# Patient Record
Sex: Female | Born: 1970
Health system: Southern US, Community
[De-identification: ages and names within clinical notes are randomized; demographics above are authoritative.]

## PROBLEM LIST (undated history)

## (undated) DIAGNOSIS — IMO0002 Reserved for concepts with insufficient information to code with codable children: Secondary | ICD-10-CM

## (undated) DIAGNOSIS — C9110 Chronic lymphocytic leukemia of B-cell type not having achieved remission: Secondary | ICD-10-CM

## (undated) DIAGNOSIS — M719 Bursopathy, unspecified: Secondary | ICD-10-CM

## (undated) DIAGNOSIS — R87612 Low grade squamous intraepithelial lesion on cytologic smear of cervix (LGSIL): Secondary | ICD-10-CM

## (undated) DIAGNOSIS — G5600 Carpal tunnel syndrome, unspecified upper limb: Secondary | ICD-10-CM

## (undated) DIAGNOSIS — J449 Chronic obstructive pulmonary disease, unspecified: Secondary | ICD-10-CM

## (undated) DIAGNOSIS — Z720 Tobacco use: Secondary | ICD-10-CM

## (undated) DIAGNOSIS — F509 Eating disorder, unspecified: Secondary | ICD-10-CM

## (undated) DIAGNOSIS — N879 Dysplasia of cervix uteri, unspecified: Secondary | ICD-10-CM

## (undated) DIAGNOSIS — N870 Mild cervical dysplasia: Secondary | ICD-10-CM

## (undated) DIAGNOSIS — K219 Gastro-esophageal reflux disease without esophagitis: Secondary | ICD-10-CM

## (undated) DIAGNOSIS — E538 Deficiency of other specified B group vitamins: Secondary | ICD-10-CM

## (undated) DIAGNOSIS — C539 Malignant neoplasm of cervix uteri, unspecified: Secondary | ICD-10-CM

## (undated) DIAGNOSIS — F32A Depression, unspecified: Secondary | ICD-10-CM

## (undated) DIAGNOSIS — F329 Major depressive disorder, single episode, unspecified: Secondary | ICD-10-CM

## (undated) DIAGNOSIS — F428 Other obsessive-compulsive disorder: Secondary | ICD-10-CM

## (undated) DIAGNOSIS — F419 Anxiety disorder, unspecified: Secondary | ICD-10-CM

## (undated) DIAGNOSIS — G4700 Insomnia, unspecified: Secondary | ICD-10-CM

## (undated) DIAGNOSIS — Z915 Personal history of self-harm: Secondary | ICD-10-CM

## (undated) HISTORY — DX: Deficiency of other specified B group vitamins: E53.8

## (undated) HISTORY — DX: Malignant neoplasm of cervix uteri, unspecified: C53.9

## (undated) HISTORY — DX: Personal history of self-harm: Z91.5

## (undated) HISTORY — DX: Depression, unspecified: F32.A

## (undated) HISTORY — DX: Carpal tunnel syndrome, unspecified upper limb: G56.00

## (undated) HISTORY — DX: Bursopathy, unspecified: M71.9

## (undated) HISTORY — DX: Tobacco use: Z72.0

## (undated) HISTORY — PX: CERVICAL BIOPSY  W/ LOOP ELECTRODE EXCISION: SUR135

## (undated) HISTORY — DX: Major depressive disorder, single episode, unspecified: F32.9

## (undated) HISTORY — PX: OTHER SURGICAL HISTORY: SHX169

## (undated) HISTORY — DX: Mild cervical dysplasia: N87.0

## (undated) HISTORY — PX: TUBAL LIGATION: SHX77

## (undated) HISTORY — DX: Reserved for concepts with insufficient information to code with codable children: IMO0002

## (undated) HISTORY — DX: Eating disorder, unspecified: F50.9

## (undated) HISTORY — DX: Other obsessive-compulsive disorder: F42.8

## (undated) HISTORY — DX: Insomnia, unspecified: G47.00

## (undated) HISTORY — DX: Dysplasia of cervix uteri, unspecified: N87.9

## (undated) HISTORY — DX: Anxiety disorder, unspecified: F41.9

## (undated) HISTORY — DX: Low grade squamous intraepithelial lesion on cytologic smear of cervix (LGSIL): R87.612

## (undated) MED FILL — Dexamethasone Sodium Phosphate Inj 100 MG/10ML: INTRAMUSCULAR | Qty: 2 | Status: AC

## (undated) MED FILL — Ferumoxytol Inj 510 MG/17ML (30 MG/ML) (Elemental Fe): INTRAVENOUS | Qty: 17 | Status: AC

---

## 2003-09-17 HISTORY — PX: OTHER SURGICAL HISTORY: SHX169

## 2006-05-13 ENCOUNTER — Encounter: Payer: Self-pay | Admitting: Family Medicine

## 2006-05-17 ENCOUNTER — Encounter: Payer: Self-pay | Admitting: Family Medicine

## 2006-06-16 ENCOUNTER — Encounter: Payer: Self-pay | Admitting: Family Medicine

## 2006-08-28 ENCOUNTER — Inpatient Hospital Stay: Payer: Self-pay | Admitting: Obstetrics and Gynecology

## 2008-04-23 ENCOUNTER — Emergency Department: Payer: Self-pay | Admitting: Emergency Medicine

## 2009-02-28 ENCOUNTER — Emergency Department: Payer: Self-pay

## 2012-04-20 ENCOUNTER — Emergency Department (HOSPITAL_COMMUNITY)
Admission: EM | Admit: 2012-04-20 | Discharge: 2012-04-20 | Discharge status: Against Medical Advice | Payer: Self-pay | Attending: Emergency Medicine | Admitting: Emergency Medicine

## 2012-04-20 ENCOUNTER — Encounter (HOSPITAL_COMMUNITY): Payer: Self-pay | Admitting: Emergency Medicine

## 2012-04-20 DIAGNOSIS — H538 Other visual disturbances: Secondary | ICD-10-CM | POA: Insufficient documentation

## 2012-04-20 NOTE — ED Notes (Addendum)
Reports when at work (lots of chaos and drama per pt); from L eye was seeing waves and black spots, and felt like she was going to pass out; now feels drained and headache--now waves and spots are not as bad as they were at work and are intermittent; grips equal, face symmetrical, speech clear

## 2014-07-07 DIAGNOSIS — R87612 Low grade squamous intraepithelial lesion on cytologic smear of cervix (LGSIL): Secondary | ICD-10-CM | POA: Insufficient documentation

## 2014-07-07 LAB — LIPID PANEL
Cholesterol: 152 mg/dL (ref 0–200)
HDL: 68 mg/dL (ref 35–70)
LDL CALC: 73 mg/dL
TRIGLYCERIDES: 53 mg/dL (ref 40–160)

## 2014-07-07 LAB — HM PAP SMEAR: HM Pap smear: ABNORMAL

## 2014-07-18 DIAGNOSIS — Z87410 Personal history of cervical dysplasia: Secondary | ICD-10-CM | POA: Insufficient documentation

## 2014-08-03 ENCOUNTER — Ambulatory Visit: Payer: Self-pay | Admitting: Family Medicine

## 2014-08-03 LAB — HM MAMMOGRAPHY: HM Mammogram: NORMAL

## 2015-07-10 ENCOUNTER — Encounter: Payer: Self-pay | Admitting: Family Medicine

## 2015-07-10 DIAGNOSIS — F5001 Anorexia nervosa, restricting type: Secondary | ICD-10-CM | POA: Insufficient documentation

## 2015-07-10 DIAGNOSIS — D72820 Lymphocytosis (symptomatic): Secondary | ICD-10-CM | POA: Insufficient documentation

## 2015-07-10 DIAGNOSIS — Z9151 Personal history of suicidal behavior: Secondary | ICD-10-CM | POA: Insufficient documentation

## 2015-07-10 DIAGNOSIS — F411 Generalized anxiety disorder: Secondary | ICD-10-CM | POA: Insufficient documentation

## 2015-07-10 DIAGNOSIS — F429 Obsessive-compulsive disorder, unspecified: Secondary | ICD-10-CM | POA: Insufficient documentation

## 2015-07-10 DIAGNOSIS — E538 Deficiency of other specified B group vitamins: Secondary | ICD-10-CM | POA: Insufficient documentation

## 2015-07-10 DIAGNOSIS — N870 Mild cervical dysplasia: Secondary | ICD-10-CM | POA: Insufficient documentation

## 2015-07-10 DIAGNOSIS — Z720 Tobacco use: Secondary | ICD-10-CM | POA: Insufficient documentation

## 2015-07-10 DIAGNOSIS — Z915 Personal history of self-harm: Secondary | ICD-10-CM | POA: Insufficient documentation

## 2015-07-10 DIAGNOSIS — G4700 Insomnia, unspecified: Secondary | ICD-10-CM | POA: Insufficient documentation

## 2015-07-10 DIAGNOSIS — F330 Major depressive disorder, recurrent, mild: Secondary | ICD-10-CM | POA: Insufficient documentation

## 2015-07-12 ENCOUNTER — Encounter: Payer: Self-pay | Admitting: Family Medicine

## 2015-07-12 ENCOUNTER — Ambulatory Visit (INDEPENDENT_AMBULATORY_CARE_PROVIDER_SITE_OTHER): Payer: BLUE CROSS/BLUE SHIELD | Admitting: Family Medicine

## 2015-07-12 VITALS — BP 104/70 | HR 78 | Temp 98.2°F | Resp 14 | Ht 69.0 in | Wt 145.1 lb

## 2015-07-12 DIAGNOSIS — Z23 Encounter for immunization: Secondary | ICD-10-CM

## 2015-07-12 DIAGNOSIS — R87612 Low grade squamous intraepithelial lesion on cytologic smear of cervix (LGSIL): Secondary | ICD-10-CM

## 2015-07-12 DIAGNOSIS — Z0000 Encounter for general adult medical examination without abnormal findings: Secondary | ICD-10-CM

## 2015-07-12 DIAGNOSIS — F50019 Anorexia nervosa, restricting type, unspecified: Secondary | ICD-10-CM

## 2015-07-12 DIAGNOSIS — Z720 Tobacco use: Secondary | ICD-10-CM

## 2015-07-12 DIAGNOSIS — E538 Deficiency of other specified B group vitamins: Secondary | ICD-10-CM

## 2015-07-12 DIAGNOSIS — Z01419 Encounter for gynecological examination (general) (routine) without abnormal findings: Secondary | ICD-10-CM

## 2015-07-12 DIAGNOSIS — Z1239 Encounter for other screening for malignant neoplasm of breast: Secondary | ICD-10-CM

## 2015-07-12 DIAGNOSIS — Z124 Encounter for screening for malignant neoplasm of cervix: Secondary | ICD-10-CM

## 2015-07-12 DIAGNOSIS — F330 Major depressive disorder, recurrent, mild: Secondary | ICD-10-CM

## 2015-07-12 DIAGNOSIS — F5001 Anorexia nervosa, restricting type: Secondary | ICD-10-CM

## 2015-07-12 DIAGNOSIS — Z13220 Encounter for screening for lipoid disorders: Secondary | ICD-10-CM | POA: Diagnosis not present

## 2015-07-12 DIAGNOSIS — D72829 Elevated white blood cell count, unspecified: Secondary | ICD-10-CM

## 2015-07-12 NOTE — Progress Notes (Signed)
Name: Monique [name]   MRN: [id number]    DOB: [birth year]   Date:07/12/2015       Progress Note  Subjective  Chief Complaint  Chief Complaint  Patient presents with  . Annual Exam    HPI  Well Woman Exam: she states she is emotionally not doing well. Anorexia is recurring, lost 6 lbs since last visit, worried about finances.  Sexually active, same partner, s/p tubal ligation.   Leukocytosis and thrombocytosis history : we will recheck  CBC  Major Depression/GAD/Anorexia: not doing very well, she has stopped taking Fluvoxamine daily , only taking prn medication now.  Stopped taking the higher dose because it was making her like a zombie, only taking prn 25 mg prn . She denies suicidal thoughts or ideation. She has episodes of anhedonia. 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   Abnormal pap smear: had colposcopy by GYN ( [facility] - Dr Marcelline Mates ) and results showed CIN and advised to keep having yearly pap smears. No vaginal bleeding or dyspareunia, but has some post-coital spotting for about one day.  .  Patient Active Problem List   Diagnosis Date Noted  . B12 deficiency 07/10/2015  . Insomnia, persistent 07/10/2015  . Mild episode of recurrent major depressive disorder ([place]) 07/10/2015  . Anorexia nervosa, restricting type 07/10/2015  . Anxiety, generalized 07/10/2015  . H/O suicide attempt 07/10/2015  . Leukocytosis 07/10/2015  . Cervical dysplasia, mild 07/10/2015  . Obsessive-compulsive disorder 07/10/2015  . Tobacco use 07/10/2015  . Low grade squamous intraepithelial lesion (LGSIL) on cervical Pap smear 07/07/2014    Past Surgical History  Procedure Laterality Date  . Cervical biopsy  w/ loop electrode excision    . Tubal ligation      Family History  Problem Relation Age of Onset  . Cancer Mother     thyroid  . Alcohol abuse Father   . Depression Sister   . Cancer Sister     cevical  . Alcohol abuse  Brother   . Depression Brother   . Bipolar disorder Brother   . ADD / ADHD Son     Social History   Social History  . Marital Status: Divorced    Spouse Name: N/A  . Number of Children: N/A  . Years of Education: N/A   Occupational History  . Not on file.   Social History Main Topics  . Smoking status: Current Every Day Smoker -- 1.50 packs/day  . Smokeless tobacco: Never Used  . Alcohol Use: No  . Drug Use: No  . Sexual Activity: Yes   Other Topics Concern  . Not on file   Social History Narrative     Current outpatient prescriptions:  .  fluvoxaMINE (LUVOX) 25 MG tablet, Take 25 mg by mouth 2 (two) times daily., Disp: , Rfl:   No Known Allergies   ROS  Constitutional: Negative for fever or weight change.  Respiratory: Negative for cough and shortness of breath.   Cardiovascular: Negative for chest pain or palpitations.  Gastrointestinal: Negative for abdominal pain, no bowel changes.  Musculoskeletal: Negative for gait problem or joint swelling.  Skin: Negative for rash.  Neurological: Negative for dizziness or headache.  No other specific complaints in a complete review of systems (except as listed in HPI above).  Objective  Filed Vitals:   07/12/15 0846  BP: 104/70  Pulse: 78  Temp: 98.2 F (36.8 C)  TempSrc: Oral  Resp: 14  Height: 5\' 9"  (1.753 m)  Weight: 145 lb 1.6 oz (65.817 kg)  SpO2: 98%    Body mass index is 21.42 kg/(m^2).  Physical Exam  Constitutional: Patient appears well-developed and well-nourished. No distress.  HENT: Head: Normocephalic and atraumatic. Ears: B TMs ok, no erythema or effusion; Nose: Nose normal. Mouth/Throat: Oropharynx is clear and moist. No oropharyngeal exudate.  Eyes: Conjunctivae and EOM are normal. Pupils are equal, round, and reactive to light. No scleral icterus.  Neck: Normal range of motion. Neck supple. No JVD present. No thyromegaly present.  Cardiovascular: Normal rate, regular rhythm and normal  heart sounds.  No murmur heard. No BLE edema. Pulmonary/Chest: Effort normal and breath sounds normal. No respiratory distress. Abdominal: Soft. Bowel sounds are normal, no distension. There is no tenderness. no masses Breast: no lumps or masses, no nipple discharge or rashes FEMALE GENITALIA:  External genitalia normal External urethra normal Vaginal vault normal without discharge or lesions Cervix friable Bimanual exam normal without masses RECTAL: normal external exam, internal hemorrhoid, non tender at 9 o'clock Musculoskeletal: Normal range of motion, no joint effusions. No gross deformities Neurological: he is alert and oriented to person, place, and time. No cranial nerve deficit. Coordination, balance, strength, speech and gait are normal.  Skin: Skin is warm and dry. No rash noted. No erythema.  Psychiatric: Patient has a normal mood and affect. behavior is normal. Judgment and thought content normal.   PHQ2/9: Depression screen PHQ 2/9 07/12/2015  Decreased Interest 0  Down, Depressed, Hopeless 1  PHQ - 2 Score 1    Fall Risk: Fall Risk  07/12/2015  Falls in the past year? No    Functional Status Survey: Is the patient deaf or have difficulty hearing?: No Does the patient have difficulty seeing, even when wearing glasses/contacts?: Yes (contacts/glasses) Does the patient have difficulty walking or climbing stairs?: No Does the patient have difficulty dressing or bathing?: No Does the patient have difficulty doing errands alone such as visiting a doctor's office or shopping?: No    Assessment & Plan  1. Well woman exam  Discussed importance of 150 minutes of physical activity weekly, eat two servings of fish weekly, eat one serving of tree nuts ( cashews, pistachios, pecans, almonds.Marland Kitchen) every other day, eat 6 servings of fruit/vegetables daily and drink plenty of water and avoid sweet beverages.   2. Needs flu shot  - Flu Vaccine QUAD 36+ mos PF IM (Fluarix &  Fluzone Quad PF) - refused  3. Leukocytosis  Recheck labs  4. Low grade squamous intraepithelial lesion (LGSIL) on cervical Pap smear  Recheck pap smear  5. Tobacco use  She would like to stop, but not a good candidate for chantix or Wellbutrin, she will try to wean self off by 2 cigarettes less per day, and when she goes to half pack, she will try to think when she really needs/craves smoking and only smoke at that time. May also try nicotine patch or gum  6. Anorexia nervosa, restricting type  - Comprehensive metabolic panel - Vit D  25 hydroxy (rtn osteoporosis monitoring) - CBC with Differential/Platelet  7. B12 deficiency  - Vitamin B12  8. Breast cancer screening  - MM Digital Screening; Future  9. Cervical cancer screening  - Pap IG, CT/NG NAA, and HPV (high risk)  10. Lipid screening  - Lipid panel  11. Mild episode of recurrent major depressive disorder ([place])  She will resume medication that she has at home and will  follow up in one month, discussed suicide hot line

## 2015-07-15 LAB — PAP IG, CT-NG NAA, HPV HIGH-RISK
CHLAMYDIA, NUC. ACID AMP: NEGATIVE
Gonococcus by Nucleic Acid Amp: NEGATIVE
HPV, HIGH-RISK: POSITIVE — AB
PAP SMEAR COMMENT: 0

## 2015-07-19 ENCOUNTER — Other Ambulatory Visit: Payer: Self-pay

## 2015-07-19 ENCOUNTER — Telehealth: Payer: Self-pay

## 2015-07-19 DIAGNOSIS — R87612 Low grade squamous intraepithelial lesion on cytologic smear of cervix (LGSIL): Secondary | ICD-10-CM

## 2015-07-19 NOTE — Telephone Encounter (Signed)
For patients referral to [facility] she states needs a Wednesday or thursday

## 2015-07-20 NOTE — Telephone Encounter (Signed)
Ok, thanks, I have sent the referral to [facility]

## 2015-08-02 ENCOUNTER — Ambulatory Visit: Payer: BLUE CROSS/BLUE SHIELD | Admitting: Family Medicine

## 2015-08-16 ENCOUNTER — Ambulatory Visit (INDEPENDENT_AMBULATORY_CARE_PROVIDER_SITE_OTHER): Payer: BLUE CROSS/BLUE SHIELD | Admitting: Obstetrics and Gynecology

## 2015-08-16 ENCOUNTER — Encounter: Payer: Self-pay | Admitting: Obstetrics and Gynecology

## 2015-08-16 VITALS — BP 100/61 | HR 84 | Ht 69.0 in | Wt 147.5 lb

## 2015-08-16 DIAGNOSIS — N871 Moderate cervical dysplasia: Secondary | ICD-10-CM

## 2015-08-16 DIAGNOSIS — R87612 Low grade squamous intraepithelial lesion on cytologic smear of cervix (LGSIL): Secondary | ICD-10-CM

## 2015-08-18 NOTE — Progress Notes (Signed)
GYNECOLOGY CLINIC COLPOSCOPY PROCEDURE NOTE   [name] is a 44 y.o. P66 female here for colposcopy for low-grade squamous intraepithelial neoplasia (LGSIL - encompassing HPV,mild dysplasia,CIN I) pap smear on 06/2015.  Referred from [facility] (Dr. Ancil Boozer). Of note, patient with h/o LGSIL pap last year s/p colposcopy with CIN I-II noted on biopsy.  Has h/o LEEP in the past. Discussed role for HPV in cervical dysplasia, need for surveillance.  Patient given informed verbal consent.   Placed in lithotomy position. Cervix viewed with speculum and colposcope after application of acetic acid.   Colposcopy adequate? Yes  no visible lesions; a random biopsy at 3 o'clock obtained.  ECC specimen obtained. All specimens were labelled and sent to pathology.  Patient was given post procedure instructions.  Will follow up pathology and manage accordingly.  Routine preventative health maintenance measures emphasized.  Advised on smoking cessation.     Rubie Maid, MD [facility]

## 2015-08-21 ENCOUNTER — Encounter: Payer: Self-pay | Admitting: Obstetrics and Gynecology

## 2015-08-21 LAB — PATHOLOGY

## 2015-08-22 ENCOUNTER — Telehealth: Payer: Self-pay | Admitting: Obstetrics and Gynecology

## 2015-08-22 NOTE — Telephone Encounter (Signed)
Spoke with Dr.Cherry, she gave verbal permission to give pt negative biopsy results. Pt informed.

## 2015-08-22 NOTE — Telephone Encounter (Signed)
Pt called for her pathology results. Was a little nervous and anxious to find out,

## 2015-09-14 ENCOUNTER — Encounter: Payer: Self-pay | Admitting: Family Medicine

## 2015-09-14 ENCOUNTER — Telehealth: Payer: Self-pay

## 2015-09-14 NOTE — Telephone Encounter (Signed)
Monique [name] from [facility] called to let us know they received patients lab results and scanned them into the media.  I called patient to notify of normal results except for glucose was a little low, which was due to fasting.  WBC was elevated and would recheck at next visit. B12 was towards low end of normal and to take OTC B12 supplement.

## 2016-01-03 ENCOUNTER — Encounter: Payer: Self-pay | Admitting: Family Medicine

## 2016-01-03 ENCOUNTER — Ambulatory Visit (INDEPENDENT_AMBULATORY_CARE_PROVIDER_SITE_OTHER): Payer: BLUE CROSS/BLUE SHIELD | Admitting: Family Medicine

## 2016-01-03 VITALS — BP 100/61 | HR 84 | Temp 98.8°F | Resp 17 | Ht 69.0 in | Wt 148.4 lb

## 2016-01-03 DIAGNOSIS — R21 Rash and other nonspecific skin eruption: Secondary | ICD-10-CM

## 2016-01-03 DIAGNOSIS — L03312 Cellulitis of back [any part except buttock]: Secondary | ICD-10-CM | POA: Diagnosis not present

## 2016-01-03 DIAGNOSIS — L03114 Cellulitis of left upper limb: Secondary | ICD-10-CM

## 2016-01-03 MED ORDER — MUPIROCIN 2 % EX OINT
1.0000 | TOPICAL_OINTMENT | Freq: Three times a day (TID) | CUTANEOUS | Status: DC
Start: 2016-01-03 — End: 2016-08-14

## 2016-01-03 MED ORDER — CLINDAMYCIN HCL 300 MG PO CAPS: 300.0000 mg | ORAL_CAPSULE | Freq: Four times a day (QID) | ORAL | Status: DC

## 2016-01-03 NOTE — Progress Notes (Signed)
Name: Monique [name]   MRN: [id number]    DOB: 03-16-1971   Date:01/03/2016       Progress Note  Subjective  Chief Complaint  Chief Complaint  Patient presents with  . Acute Visit    Possible spider bites (Dr. Ancil Boozer pt)    HPI  Pt. Presents for evaluation of what appears to be an insect bite on her left upper arm and left lateral upper back. Noticed 2 days ago. The lesion on the back and the arm did not cause any drainage but they are sore to touch. She applied Hydrocortisone cream to the lesion on her arm.   She also noticed a rash on her lower face (chin) for two weeks. Lesions described as 'heat bumps', sometimes a clear liquid comes out when she tries to push them. She has applied Hydrocortisone cream to her face and has not helped with the lesions.  Past Medical History  Diagnosis Date  . Cervical intraepithelial neoplasia I   . Pap smear abnormality of cervix with LGSIL   . History of self-harm   . Vitamin B12 deficiency (non anemic)   . Insomnia   . Depression   . Tobacco abuse   . Obsession   . Anxiety   . Eating disorder     Past Surgical History  Procedure Laterality Date  . Cervical biopsy  w/ loop electrode excision    . Tubal ligation      Family History  Problem Relation Age of Onset  . Cancer Mother     thyroid  . Alcohol abuse Father   . Depression Sister   . Cancer Sister     cevical  . Alcohol abuse Brother   . Depression Brother   . Bipolar disorder Brother   . ADD / ADHD Son     Social History   Social History  . Marital Status: Divorced    Spouse Name: N/A  . Number of Children: N/A  . Years of Education: N/A   Occupational History  . Not on file.   Social History Main Topics  . Smoking status: Current Every Day Smoker -- 1.50 packs/day  . Smokeless tobacco: Never Used  . Alcohol Use: No  . Drug Use: No  . Sexual Activity: Yes   Other Topics Concern  . Not on file   Social History Narrative     Current outpatient  prescriptions:  .  fluvoxaMINE (LUVOX) 25 MG tablet, Take 25 mg by mouth 2 (two) times daily. Reported on 01/03/2016, Disp: , Rfl:   No Known Allergies   Review of Systems  Constitutional: Negative for fever, chills and malaise/fatigue.  Skin: Positive for rash.     Objective  Filed Vitals:   01/03/16 0908  BP: 100/61  Pulse: 84  Temp: 98.8 F (37.1 C)  TempSrc: Oral  Resp: 17  Height: 5\' 9"  (1.753 m)  Weight: 148 lb 6.4 oz (67.314 kg)  SpO2: 98%    Physical Exam  Constitutional: She is well-developed, well-nourished, and in no distress.  HENT:  Head: Normocephalic and atraumatic.  Multiple erythematous raised lesions on the chin, appear to resemble lesions of impetigo  Skin: Rash noted. Rash is macular. There is erythema.     Macular, erythematous lesion on left upper arm, no drainage or fluctuance. Similar lesion on left upper back, no drainage.  Nursing note and vitals reviewed.    Assessment & Plan  1. Facial rash Start on Bactroban ointment to be applied 3 times  daily. Reassess if no improvement - mupirocin ointment (BACTROBAN) 2 %; Apply 1 application topically 3 (three) times daily.  Dispense: 30 g; Refill: 0  2. Cellulitis of left upper arm No abscess on palpation, we will treat with clindamycin. Reassess if no improvement - clindamycin (CLEOCIN) 300 MG capsule; Take 1 capsule (300 mg total) by mouth 4 (four) times daily.  Dispense: 40 capsule; Refill: 0  3. Cellulitis of skin of back  - clindamycin (CLEOCIN) 300 MG capsule; Take 1 capsule (300 mg total) by mouth 4 (four) times daily.  Dispense: 40 capsule; Refill: 0   Myleah Cavendish [facility] 01/03/2016 9:27 AM

## 2016-03-19 ENCOUNTER — Emergency Department
Admission: EM | Admit: 2016-03-19 | Discharge: 2016-03-19 | Disposition: A | Discharge status: Home / Self Care | Payer: BLUE CROSS/BLUE SHIELD | Attending: Emergency Medicine | Admitting: Emergency Medicine

## 2016-03-19 DIAGNOSIS — Y939 Activity, unspecified: Secondary | ICD-10-CM | POA: Insufficient documentation

## 2016-03-19 DIAGNOSIS — X58XXXA Exposure to other specified factors, initial encounter: Secondary | ICD-10-CM | POA: Insufficient documentation

## 2016-03-19 DIAGNOSIS — Y999 Unspecified external cause status: Secondary | ICD-10-CM | POA: Diagnosis not present

## 2016-03-19 DIAGNOSIS — F17200 Nicotine dependence, unspecified, uncomplicated: Secondary | ICD-10-CM | POA: Insufficient documentation

## 2016-03-19 DIAGNOSIS — Y929 Unspecified place or not applicable: Secondary | ICD-10-CM | POA: Diagnosis not present

## 2016-03-19 DIAGNOSIS — S86911A Strain of unspecified muscle(s) and tendon(s) at lower leg level, right leg, initial encounter: Secondary | ICD-10-CM | POA: Diagnosis not present

## 2016-03-19 DIAGNOSIS — F329 Major depressive disorder, single episode, unspecified: Secondary | ICD-10-CM | POA: Diagnosis not present

## 2016-03-19 DIAGNOSIS — T148XXA Other injury of unspecified body region, initial encounter: Secondary | ICD-10-CM

## 2016-03-19 DIAGNOSIS — M79604 Pain in right leg: Secondary | ICD-10-CM | POA: Diagnosis present

## 2016-03-19 MED ORDER — CYCLOBENZAPRINE HCL 10 MG PO TABS: 10.0000 mg | ORAL_TABLET | Freq: Three times a day (TID) | ORAL | Status: DC | PRN

## 2016-03-19 MED ORDER — IBUPROFEN 800 MG PO TABS: 800.0000 mg | ORAL_TABLET | Freq: Three times a day (TID) | ORAL | Status: DC | PRN

## 2016-03-19 NOTE — ED Notes (Signed)
Pt alert and oriented X4, active, cooperative, pt in NAD. RR even and unlabored, color WNL.  Pt informed to return if any life threatening symptoms occur.  Ambulatory. 

## 2016-03-19 NOTE — ED Notes (Signed)
Pt states three days of right sharp shooting leg pain that radiates from hip to thigh posteriorly and anteriorly. Pt with cms currently intact to toes, but states sometimes when she sits she feels decreased sensation to right toes. Pt states took 2 ibuprofen pta.

## 2016-03-19 NOTE — Discharge Instructions (Signed)
Muscle Strain A muscle strain is an injury that occurs when a muscle is stretched beyond its normal length. Usually a small number of muscle fibers are torn when this happens. Muscle strain is rated in degrees. First-degree strains have the least amount of muscle fiber tearing and pain. Second-degree and third-degree strains have increasingly more tearing and pain.  Usually, recovery from muscle strain takes 1-2 weeks. Complete healing takes 5-6 weeks.  CAUSES  Muscle strain happens when a sudden, violent force placed on a muscle stretches it too far. This may occur with lifting, sports, or a fall.  RISK FACTORS Muscle strain is especially common in athletes.  SIGNS AND SYMPTOMS At the site of the muscle strain, there may be:  Pain.  Bruising.  Swelling.  Difficulty using the muscle due to pain or lack of normal function. DIAGNOSIS  Your health care provider will perform a physical exam and ask about your medical history. TREATMENT  Often, the best treatment for a muscle strain is resting, icing, and applying cold compresses to the injured area.  HOME CARE INSTRUCTIONS   Use the PRICE method of treatment to promote muscle healing during the first 2-3 days after your injury. The PRICE method involves:  Protecting the muscle from being injured again.  Restricting your activity and resting the injured body part.  Icing your injury. To do this, put ice in a plastic bag. Place a towel between your skin and the bag. Then, apply the ice and leave it on from 15-20 minutes each hour. After the third day, switch to moist heat packs.  Apply compression to the injured area with a splint or elastic bandage. Be careful not to wrap it too tightly. This may interfere with blood circulation or increase swelling.  Elevate the injured body part above the level of your heart as often as you can.  Only take over-the-counter or prescription medicines for pain, discomfort, or fever as directed by your  health care provider.  Warming up prior to exercise helps to prevent future muscle strains. SEEK MEDICAL CARE IF:   You have increasing pain or swelling in the injured area.  You have numbness, tingling, or a significant loss of strength in the injured area. MAKE SURE YOU:   Understand these instructions.  Will watch your condition.  Will get help right away if you are not doing well or get worse.   This information is not intended to replace advice given to you by your health care provider. Make sure you discuss any questions you have with your health care provider.   Document Released: 09/02/2005 Document Revised: 06/23/2013 Document Reviewed: 04/01/2013 Elsevier Interactive Patient Education 2016 [place]. Groin Strain A groin strain (also called a groin pull) is an injury to the muscles or tendon on the upper inner part of the thigh. These muscles are called the adductor muscles or groin muscles. They are responsible for moving the leg across the body. A muscle strain occurs when a muscle is overstretched and some muscle fibers are torn. A groin strain can range from mild to severe depending on how many muscle fibers are affected and whether the muscle fibers are partially or completely torn.  Groin strains usually occur during exercise or participation in sports. The injury often happens when a sudden, violent force is placed on a muscle, stretching the muscle too far. A strain is more likely to occur when your muscles are not warmed up or if you are not properly conditioned. Depending on  the severity of the groin strain, recovery time may vary from a few weeks to several weeks. Severe injuries often require 4-6 weeks for recovery. In these cases, complete healing can take 4-5 months.  CAUSES   Stretching the groin muscles too far or too suddenly, often during side-to-side motion with an abrupt change in direction.  Putting repeated stress on the groin muscles over a long period  of time.  Performing vigorous activity without properly stretching the groin muscles beforehand. SYMPTOMS   Pain and tenderness in the groin area. This begins as sharp pain and persists as a dull ache.  Popping or snapping feeling when the injury occurs (for severe strains).  Swelling or bruising.  Muscle spasms.  Weakness in the leg.  Stiffness in the groin area with decreased ability to move the affected muscles. DIAGNOSIS  Your caregiver will perform a physical exam to diagnose a groin strain. You will be asked about your symptoms and how the injury occurred. X-rays are sometimes needed to rule out a broken bone or cartilage problems. Your caregiver may order a CT scan or MRI if a complete muscle tear is suspected. TREATMENT  A groin strain will often heal on its own. Your caregiver may prescribe medicines to help manage pain and swelling (anti-inflammatory medicine). You may be told to use crutches for the first few days to minimize your pain. HOME CARE INSTRUCTIONS   Rest. Do not use the strained muscle if it causes pain.  Put ice on the injured area.  Put ice in a plastic bag.  Place a towel between your skin and the bag.  Leave the ice on for 15-20 minutes, every 2-3 hours. Do this for the first 2 days after the injury.  Only take over-the-counter or prescription medicines as directed by your caregiver.  Wrap the injured area with an elastic bandage as directed by your caregiver.  Keep the injured leg raised (elevated).  Walk, stretch, and perform range-of-motion exercises to improve blood flow to the injured area. Only perform these activities if you can do so without any pain. To prevent muscle strains:  Warm up before exercise.  Develop proper conditioning and strength in the groin muscles. SEEK IMMEDIATE MEDICAL CARE IF:   You have increased pain or swelling in the affected area.   Your symptoms are not improving or are getting worse. MAKE SURE YOU:    Understand these instructions.  Will watch your condition.  Will get help right away if you are not doing well or get worse.   This information is not intended to replace advice given to you by your health care provider. Make sure you discuss any questions you have with your health care provider.   Document Released: 04/30/2004 Document Revised: 08/19/2012 Document Reviewed: 05/06/2012 Elsevier Interactive Patient Education [employer].

## 2016-03-19 NOTE — ED Provider Notes (Signed)
[facility] Emergency Department Provider Note        Time seen: ----------------------------------------- 7:10 AM on 03/19/2016 -----------------------------------------    I have reviewed the triage vital signs and the nursing notes.   HISTORY  Chief Complaint Leg Pain    HPI Monique [name] is a 45 y.o. female who presents ER for 3 days of shooting right leg pain. Patient states it radiates from her thigh to her knee. She states sometimes when she sits she feels decreased sensation to her toes. She took 2 ibuprofen prior to arrival. Currently she has no complaints. She denies fevers chills or other complaints.   Past Medical History  Diagnosis Date  . Cervical intraepithelial neoplasia I   . Pap smear abnormality of cervix with LGSIL   . History of self-harm   . Vitamin B12 deficiency (non anemic)   . Insomnia   . Depression   . Tobacco abuse   . Obsession   . Anxiety   . Eating disorder     Patient Active Problem List   Diagnosis Date Noted  . Facial rash 01/03/2016  . Cellulitis of left upper arm 01/03/2016  . Cellulitis of skin of back 01/03/2016  . B12 deficiency 07/10/2015  . Insomnia, persistent 07/10/2015  . Mild episode of recurrent major depressive disorder ([place]) 07/10/2015  . Anorexia nervosa, restricting type 07/10/2015  . Anxiety, generalized 07/10/2015  . H/O suicide attempt 07/10/2015  . Leukocytosis 07/10/2015  . Cervical dysplasia, mild 07/10/2015  . Obsessive-compulsive disorder 07/10/2015  . Tobacco use 07/10/2015  . Dysplasia of cervix, high grade CIN 2 07/18/2014  . Low grade squamous intraepithelial lesion (LGSIL) on cervical Pap smear 07/07/2014    Past Surgical History  Procedure Laterality Date  . Cervical biopsy  w/ loop electrode excision    . Tubal ligation      Allergies Review of patient's allergies indicates no known allergies.  Social History Social History  Substance Use Topics  . Smoking  status: Current Every Day Smoker -- 1.50 packs/day  . Smokeless tobacco: Never Used  . Alcohol Use: No    Review of Systems Constitutional: Negative for fever. Cardiovascular: Negative for chest pain. Respiratory: Negative for shortness of breath. Gastrointestinal: Negative for abdominal pain, vomiting and diarrhea. Musculoskeletal: Positive for right leg pain Skin: Negative for rash. Neurological: Negative for headaches, focal weakness or numbness. ____________________________________________   PHYSICAL EXAM:  VITAL SIGNS: ED Triage Vitals  Enc Vitals Group     BP 03/19/16 0516 103/72 mmHg     Pulse Rate 03/19/16 0516 76     Resp 03/19/16 0516 16     Temp 03/19/16 0516 98.7 F (37.1 C)     Temp Source 03/19/16 0516 Oral     SpO2 03/19/16 0516 100 %     Weight 03/19/16 0516 150 lb (68.04 kg)     Height 03/19/16 0516 5\' 7"  (1.702 m)     Head Cir --      Peak Flow --      Pain Score 03/19/16 0517 0     Pain Loc --      Pain Edu? --      Excl. in [place]? --     Constitutional: Alert and oriented. Well appearing and in no distress. Eyes: Conjunctivae are normal. PERRL. Normal extraocular movements. Cardiovascular: Normal rate, regular rhythm. No murmurs, rubs, or gallops. Respiratory: Normal respiratory effort without tachypnea nor retractions. Breath sounds are clear and equal bilaterally. No wheezes/rales/rhonchi. Gastrointestinal: Soft  and nontender. Normal bowel sounds Musculoskeletal: Patient has some tenderness noted over the right thigh proximally at Sartorius muscle origin and insertion site. There is no pain with range of motion of the right leg, negative cross straight leg raise examination. Neurologic:  Normal speech and language. No gross focal neurologic deficits are appreciated.  Skin:  Skin is warm, dry and intact. No rash noted. Psychiatric: Mood and affect are normal. Speech and behavior are normal.  ____________________________________________  ED  COURSE:  Pertinent labs & imaging results that were available during my care of the patient were reviewed by me and considered in my medical decision making (see chart for details).  Patient is in no acute distress, symptoms seem to be from muscle strain and spasm. ____________________________________________  FINAL ASSESSMENT AND PLAN  Muscle strain  Plan: Patient with muscle strength is likely work-related. Patient states she walks a lot on her job, seems to have specific muscular tenderness in the right thigh. She'll be discharged with anti-inflammatory muscle relaxants.   Earleen Newport, MD   Note: This dictation was prepared with Dragon dictation. Any transcriptional errors that result from this process are unintentional   Earleen Newport, MD 03/19/16 [phone]

## 2016-03-19 NOTE — ED Notes (Signed)
Report from [place], [place]

## 2016-06-29 ENCOUNTER — Emergency Department: Payer: BLUE CROSS/BLUE SHIELD

## 2016-06-29 ENCOUNTER — Emergency Department
Admission: EM | Admit: 2016-06-29 | Discharge: 2016-06-30 | Disposition: A | Discharge status: Home / Self Care | Payer: BLUE CROSS/BLUE SHIELD | Attending: Student | Admitting: Student

## 2016-06-29 DIAGNOSIS — X58XXXA Exposure to other specified factors, initial encounter: Secondary | ICD-10-CM | POA: Insufficient documentation

## 2016-06-29 DIAGNOSIS — Y999 Unspecified external cause status: Secondary | ICD-10-CM | POA: Diagnosis not present

## 2016-06-29 DIAGNOSIS — S86819A Strain of other muscle(s) and tendon(s) at lower leg level, unspecified leg, initial encounter: Secondary | ICD-10-CM | POA: Diagnosis not present

## 2016-06-29 DIAGNOSIS — Y939 Activity, unspecified: Secondary | ICD-10-CM | POA: Diagnosis not present

## 2016-06-29 DIAGNOSIS — Y929 Unspecified place or not applicable: Secondary | ICD-10-CM | POA: Insufficient documentation

## 2016-06-29 DIAGNOSIS — M79604 Pain in right leg: Secondary | ICD-10-CM

## 2016-06-29 DIAGNOSIS — T148XXA Other injury of unspecified body region, initial encounter: Secondary | ICD-10-CM

## 2016-06-29 DIAGNOSIS — S76911A Strain of unspecified muscles, fascia and tendons at thigh level, right thigh, initial encounter: Secondary | ICD-10-CM | POA: Diagnosis not present

## 2016-06-29 DIAGNOSIS — F17200 Nicotine dependence, unspecified, uncomplicated: Secondary | ICD-10-CM | POA: Insufficient documentation

## 2016-06-29 DIAGNOSIS — M79651 Pain in right thigh: Secondary | ICD-10-CM | POA: Diagnosis present

## 2016-06-29 LAB — BASIC METABOLIC PANEL
ANION GAP: 8 (ref 5–15)
BUN: 20 mg/dL (ref 6–20)
CALCIUM: 8.7 mg/dL — AB (ref 8.9–10.3)
CO2: 27 mmol/L (ref 22–32)
CREATININE: 0.88 mg/dL (ref 0.44–1.00)
Chloride: 104 mmol/L (ref 101–111)
GLUCOSE: 84 mg/dL (ref 65–99)
Potassium: 4.3 mmol/L (ref 3.5–5.1)
Sodium: 139 mmol/L (ref 135–145)

## 2016-06-29 LAB — CK: Total CK: 61 U/L (ref 38–234)

## 2016-06-29 LAB — POCT PREGNANCY, URINE: PREG TEST UR: NEGATIVE

## 2016-06-29 MED ORDER — OXYCODONE HCL 5 MG PO TABS
10.0000 mg | ORAL_TABLET | Freq: Once | ORAL | Status: AC
  Administered 2016-06-29: 10 mg via ORAL
  Filled 2016-06-29: qty 2

## 2016-06-29 NOTE — ED Triage Notes (Signed)
Patient reports right leg pain for 2 days. Reports pain starts at groin and radiates downward.  Patient denies any type of injury.

## 2016-06-29 NOTE — ED Provider Notes (Signed)
[facility] Emergency Department Provider Note   ____________________________________________   First MD Initiated Contact with Patient 06/29/16 2305     (approximate)  I have reviewed the triage vital signs and the nursing notes.   HISTORY  Chief Complaint Leg Pain    HPI Monique [name] is a 45 y.o. female with history of anxiety, history of eating disorder, who presents for evaluation of 2 days of atraumatic right leg pain, gradual onset, constant, moderate, worse when she standing. Patient reports that the pain starts in the upper medial thigh and radiates down the leg. She denies any back pain. The ports that the pain feels like a "pulling sensation" in her thigh. She was seen in this emergency department in July for similar pain and diagnosed with a muscle strain, pain resolved however has recurred over the past 2 days. She denies any chest pain or difficulty breathing. No nausea, vomiting, diarrhea, fevers or chills. No history of DVT. 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   Past Medical History:  Diagnosis Date  . Anxiety   . Cervical intraepithelial neoplasia I   . Depression   . Eating disorder   . History of self-harm   . Insomnia   . Obsession   . Pap smear abnormality of cervix with LGSIL   . Tobacco abuse   . Vitamin B12 deficiency (non anemic)     Patient Active Problem List   Diagnosis Date Noted  . Facial rash 01/03/2016  . Cellulitis of left upper arm 01/03/2016  . Cellulitis of skin of back 01/03/2016  . B12 deficiency 07/10/2015  . Insomnia, persistent 07/10/2015  . Mild episode of recurrent major depressive disorder ([place]) 07/10/2015  . Anorexia nervosa, restricting type 07/10/2015  . Anxiety, generalized 07/10/2015  . H/O suicide attempt 07/10/2015  . Leukocytosis 07/10/2015  . Cervical dysplasia, mild 07/10/2015  . Obsessive-compulsive disorder 07/10/2015  . Tobacco use  07/10/2015  . Dysplasia of cervix, high grade CIN 2 07/18/2014  . Low grade squamous intraepithelial lesion (LGSIL) on cervical Pap smear 07/07/2014    Past Surgical History:  Procedure Laterality Date  . CERVICAL BIOPSY  W/ LOOP ELECTRODE EXCISION    . TUBAL LIGATION      Prior to Admission medications   Medication Sig Start Date End Date Taking? Authorizing Provider  clindamycin (CLEOCIN) 300 MG capsule Take 1 capsule (300 mg total) by mouth 4 (four) times daily. 01/03/16   Roselee Nova, MD  cyclobenzaprine (FLEXERIL) 10 MG tablet Take 1 tablet (10 mg total) by mouth 3 (three) times daily as needed for muscle spasms. 03/19/16   Earleen Newport, MD  fluvoxaMINE (LUVOX) 25 MG tablet Take 25 mg by mouth 2 (two) times daily. Reported on 01/03/2016    Historical Provider, MD  ibuprofen (ADVIL,MOTRIN) 800 MG tablet Take 1 tablet (800 mg total) by mouth every 8 (eight) hours as needed. 03/19/16   Earleen Newport, MD  mupirocin ointment (BACTROBAN) 2 % Apply 1 application topically 3 (three) times daily. 01/03/16   Roselee Nova, MD    Allergies Review of patient's allergies indicates no known allergies.  Family History  Problem Relation Age of Onset  . Cancer Mother     thyroid  . Alcohol abuse Father   . Depression Sister   . Cancer Sister     cevical  . Alcohol abuse Brother   . Depression Brother   .  Bipolar disorder Brother   . ADD / ADHD Son     Social History Social History  Substance Use Topics  . Smoking status: Current Every Day Smoker    Packs/day: 1.50  . Smokeless tobacco: Never Used  . Alcohol use No    Review of Systems Constitutional: No fever/chills Eyes: No visual changes. ENT: No sore throat. Cardiovascular: Denies chest pain. Respiratory: Denies shortness of breath. Gastrointestinal: No abdominal pain.  No nausea, no vomiting.  No diarrhea.  No constipation. Genitourinary: Negative for dysuria. Musculoskeletal: Negative for back pain. Skin:  Negative for rash. Neurological: Negative for headaches, focal weakness or numbness.  10-point ROS otherwise negative.  ____________________________________________   PHYSICAL EXAM:  Vitals:   06/29/16 2243 06/29/16 2246 06/29/16 2308 06/30/16 0132  BP:  100/66 120/65 107/78  Pulse:  77 66 61  Resp:  20 (!) 2 18  Temp:  97.9 F (36.6 C)  98.2 F (36.8 C)  TempSrc:  Oral  Oral  SpO2:  98% 99% 99%  Weight: 145 lb (65.8 kg)     Height: 5\' 7"  (1.702 m)       VITAL SIGNS: ED Triage Vitals  Enc Vitals Group     BP 06/29/16 2246 100/66     Pulse Rate 06/29/16 2246 77     Resp 06/29/16 2246 20     Temp 06/29/16 2246 97.9 F (36.6 C)     Temp Source 06/29/16 2246 Oral     SpO2 06/29/16 2246 98 %     Weight 06/29/16 2243 145 lb (65.8 kg)     Height 06/29/16 2243 5\' 7"  (1.702 m)     Head Circumference --      Peak Flow --      Pain Score 06/29/16 2243 6     Pain Loc --      Pain Edu? --      Excl. in [place]? --     Constitutional: Alert and oriented. Well appearing and in no acute distress. Eyes: Conjunctivae are normal. PERRL. EOMI. Head: Atraumatic. Nose: No congestion/rhinnorhea. Mouth/Throat: Mucous membranes are moist.  Oropharynx non-erythematous. Neck: No stridor. Supple without meningismus. Cardiovascular: Normal rate, regular rhythm. Grossly normal heart sounds.  Good peripheral circulation. Respiratory: Normal respiratory effort.  No retractions. Lungs CTAB. Gastrointestinal: Soft and nontender. No distention. No CVA tenderness. Genitourinary: deferred Musculoskeletal: Mild tenderness to palpation in the right anterior thigh him a no swelling or deformity throughout the right leg, no calf swelling/asymmetry or tenderness. Full active painless range of motion at the right hip, knee, ankle joints. 2+ right DP pulse, wiggles the toes. Neurologic:  Normal speech and language. No gross focal neurologic deficits are appreciated. No gait instability. Skin:  Skin is warm,  dry and intact. No rash noted. Psychiatric: Mood and affect are normal. Speech and behavior are normal.  ____________________________________________   LABS (all labs ordered are listed, but only abnormal results are displayed)  Labs Reviewed  BASIC METABOLIC PANEL - Abnormal; Notable for the following:       Result Value   Calcium 8.7 (*)    All other components within normal limits  CK  POC URINE PREG, ED  POCT PREGNANCY, URINE   ____________________________________________  EKG  none ____________________________________________  RADIOLOGY  Venous doppler US right leg   IMPRESSION:  No evidence of deep venous thrombosis.      ____________________________________________   PROCEDURES  Procedure(s) performed: None  Procedures  Critical Care performed: No  ____________________________________________   INITIAL  IMPRESSION / ASSESSMENT AND PLAN / ED COURSE  Pertinent labs & imaging results that were available during my care of the patient were reviewed by me and considered in my medical decision making (see chart for details).  Monique [name] is a 45 y.o. female with history of anxiety, history of eating disorder, who presents for evaluation of 2 days of atraumatic right leg pain. On exam, she is very well-appearing and in no acute distress. Vital signs stable and she is afebrile. Respiratory rate of 2 which was documented is spurious. She is neurovascularly intact in the right leg, no concern for acute arterial occlusion. She has good range of motion and no swelling however given recurrence of pain, we'll obtain a venous Doppler ultrasound to rule out DVT. Given her history of eating disorder, I will check electrolytes to make sure that a metabolic derangement is not the cause of the cramping in her leg. We'll treat her pain and reassess for disposition.  ----------------------------------------- 1:46 AM on  06/30/2016 -----------------------------------------  Patient reports significant improvement of her pain at this time. Electrolytes are within normal limits, CK is not elevated. Negative pregnancy test. Venous Doppler ultrasound is negative for DVT. Suspect muscle strain. We discussed return precautions and need for close PCP follow-up and she is comfortable with the discharge plan. Will discharge with anti-inflammatory medications as well as muscle relaxant which has been helpful for her in the past.  Clinical Course     ____________________________________________   FINAL CLINICAL IMPRESSION(S) / ED DIAGNOSES  Final diagnoses:  Right leg pain  Muscle strain      NEW MEDICATIONS STARTED DURING THIS VISIT:  New Prescriptions   No medications on file     Note:  This document was prepared using Dragon voice recognition software and may include unintentional dictation errors.    Joanne Gavel, MD 06/30/16 [phone]

## 2016-06-30 DIAGNOSIS — M79604 Pain in right leg: Secondary | ICD-10-CM | POA: Diagnosis not present

## 2016-06-30 MED ORDER — CYCLOBENZAPRINE HCL 10 MG PO TABS: 10.0000 mg | ORAL_TABLET | Freq: Three times a day (TID) | ORAL | 0 refills | Status: DC | PRN

## 2016-06-30 MED ORDER — IBUPROFEN 600 MG PO TABS: 600.0000 mg | ORAL_TABLET | Freq: Four times a day (QID) | ORAL | 0 refills | Status: DC | PRN

## 2016-06-30 NOTE — ED Notes (Signed)
Patient transported to Ultrasound 

## 2016-08-14 ENCOUNTER — Ambulatory Visit (INDEPENDENT_AMBULATORY_CARE_PROVIDER_SITE_OTHER): Payer: BLUE CROSS/BLUE SHIELD | Admitting: Family Medicine

## 2016-08-14 ENCOUNTER — Encounter: Payer: Self-pay | Admitting: Family Medicine

## 2016-08-14 VITALS — BP 104/64 | HR 88 | Temp 98.2°F | Resp 16 | Ht 67.0 in | Wt 150.5 lb

## 2016-08-14 DIAGNOSIS — Z23 Encounter for immunization: Secondary | ICD-10-CM | POA: Diagnosis not present

## 2016-08-14 DIAGNOSIS — N871 Moderate cervical dysplasia: Secondary | ICD-10-CM | POA: Diagnosis not present

## 2016-08-14 DIAGNOSIS — F411 Generalized anxiety disorder: Secondary | ICD-10-CM | POA: Diagnosis not present

## 2016-08-14 DIAGNOSIS — M7701 Medial epicondylitis, right elbow: Secondary | ICD-10-CM

## 2016-08-14 DIAGNOSIS — F330 Major depressive disorder, recurrent, mild: Secondary | ICD-10-CM

## 2016-08-14 DIAGNOSIS — F5001 Anorexia nervosa, restricting type: Secondary | ICD-10-CM

## 2016-08-14 DIAGNOSIS — M7702 Medial epicondylitis, left elbow: Secondary | ICD-10-CM

## 2016-08-14 MED ORDER — DULOXETINE HCL 30 MG PO CPEP: 30.0000 mg | ORAL_CAPSULE | Freq: Every day | ORAL | 0 refills | Status: DC

## 2016-08-14 NOTE — Progress Notes (Signed)
Name: Monique [name]   MRN: [id number]    DOB: 06-18-1971   Date:08/14/2016       Progress Note  Subjective  Chief Complaint  Chief Complaint  Patient presents with  . Anxiety    pt wants to be put back on her medication  . Depression  . Hand Pain    pt stated that she has arm and hand numbness in both arms frequently    HPI  Major Depression/GAD/Anorexia: not doing very well, she has stopped taking Fluvoxamine months ago She has been having crying spells, she states when very nervous she is unable to talk - makes her very hoarse. She has anhedonia. She is worried about her daughter and 3 grandchildren  moving back home, also raising a 45 yo, stressed financially and at work, she just found out that her 47 yo is cutting herself.  She denies suicidal thoughts or ideation, but had some thoughts recently - states she would not do it because she has promised God she would not do it.  She is seeing a therapist and has helped her but she knows that she needs to resume medication.  She has a lot of anxiety lately  Abnormal pap smear: had colposcopy by GYN ( [facility] - Dr Marcelline Mates ) and results showed CIN and advised to keep having yearly pap smears. We will refer her back.  Lateral epicondylitis: she works at [employer] and over the past few months she has noticed bilateral elbow pain, no swelling or redness, worse at work. She has not been taking medication. Discussed brace.   B12 deficiency: needs to resume supplementation   Patient Active Problem List   Diagnosis Date Noted  . B12 deficiency 07/10/2015  . Insomnia, persistent 07/10/2015  . Mild episode of recurrent major depressive disorder ([place]) 07/10/2015  . Anorexia nervosa, restricting type 07/10/2015  . Anxiety, generalized 07/10/2015  . H/O suicide attempt 07/10/2015  . Leukocytosis 07/10/2015  . Cervical dysplasia, mild 07/10/2015  . Obsessive-compulsive disorder 07/10/2015  . Tobacco use 07/10/2015  . Dysplasia of cervix,  high grade CIN 2 07/18/2014  . Low grade squamous intraepithelial lesion (LGSIL) on cervical Pap smear 07/07/2014    Past Surgical History:  Procedure Laterality Date  . CERVICAL BIOPSY  W/ LOOP ELECTRODE EXCISION    . TUBAL LIGATION      Family History  Problem Relation Age of Onset  . Cancer Mother     thyroid  . Alcohol abuse Father   . Depression Sister   . Cancer Sister     cevical  . Alcohol abuse Brother   . Depression Brother   . Bipolar disorder Brother   . ADD / ADHD Son     Social History   Social History  . Marital status: Divorced    Spouse name: N/A  . Number of children: N/A  . Years of education: N/A   Occupational History  . Not on file.   Social History Main Topics  . Smoking status: Current Every Day Smoker    Packs/day: 1.50  . Smokeless tobacco: Never Used  . Alcohol use No  . Drug use: No  . Sexual activity: Yes   Other Topics Concern  . Not on file   Social History Narrative  . No narrative on file     Current Outpatient Prescriptions:  .  ibuprofen (ADVIL,MOTRIN) 600 MG tablet, Take 1 tablet (600 mg total) by mouth every 6 (six) hours as needed for moderate pain., Disp: 15  tablet, Rfl: 0  No Known Allergies   ROS  Constitutional: Negative for fever or significant  weight change.  Respiratory: Negative for cough and shortness of breath.   Cardiovascular: Negative for chest pain or palpitations.  Gastrointestinal: Negative for abdominal pain, no bowel changes.  Musculoskeletal: Negative for gait problem or joint swelling.  Skin: Negative for rash.  Neurological: Negative for dizziness or headache.  No other specific complaints in a complete review of systems (except as listed in HPI above).  Objective  Vitals:   08/14/16 1023  BP: 104/64  Pulse: 88  Resp: 16  Temp: 98.2 F (36.8 C)  TempSrc: Oral  SpO2: 94%  Weight: 150 lb 8 oz (68.3 kg)  Height: 5' 7"  (1.702 m)    Body mass index is 23.57 kg/m.  Physical  Exam  Constitutional: Patient appears well-developed and well-nourished.  No distress.  HEENT: head atraumatic, normocephalic, pupils equal and reactive to light,  neck supple, throat within normal limits Cardiovascular: Normal rate, regular rhythm and normal heart sounds.  No murmur heard. No BLE edema. Pulmonary/Chest: Effort normal and breath sounds normal. No respiratory distress. Abdominal: Soft.  There is no tenderness. Psychiatric: Patient is depressed, crying. Judgment and thought content normal.  Recent Results (from the past 2160 hour(s))  Basic metabolic panel     Status: Abnormal   Collection Time: 06/29/16 11:18 PM  Result Value Ref Range   Sodium 139 135 - 145 mmol/L   Potassium 4.3 3.5 - 5.1 mmol/L   Chloride 104 101 - 111 mmol/L   CO2 27 22 - 32 mmol/L   Glucose, Bld 84 65 - 99 mg/dL   BUN 20 6 - 20 mg/dL   Creatinine, Ser 0.88 0.44 - 1.00 mg/dL   Calcium 8.7 (L) 8.9 - 10.3 mg/dL   GFR calc non Af Amer >60 >60 mL/min   GFR calc Af Amer >60 >60 mL/min    Comment: (NOTE) The eGFR has been calculated using the CKD EPI equation. This calculation has not been validated in all clinical situations. eGFR's persistently <60 mL/min signify possible Chronic Kidney Disease.    Anion gap 8 5 - 15  CK     Status: None   Collection Time: 06/29/16 11:18 PM  Result Value Ref Range   Total CK 61 38 - 234 U/L  Pregnancy, urine POC     Status: None   Collection Time: 06/29/16 11:35 PM  Result Value Ref Range   Preg Test, Ur NEGATIVE NEGATIVE    Comment:        THE SENSITIVITY OF THIS METHODOLOGY IS >24 mIU/mL       PHQ2/9: Depression screen [facility] 2/9 08/14/2016 01/03/2016 07/12/2015  Decreased Interest 1 0 0  Down, Depressed, Hopeless 1 0 1  PHQ - 2 Score 2 0 1  Altered sleeping 1 - -  Tired, decreased energy 0 - -  Change in appetite 0 - -  Feeling bad or failure about yourself  0 - -  Trouble concentrating 0 - -  Moving slowly or fidgety/restless 0 - -  Suicidal  thoughts 0 - -  PHQ-9 Score 3 - -  Difficult doing work/chores Somewhat difficult - -     Fall Risk: Fall Risk  08/14/2016 01/03/2016 07/12/2015  Falls in the past year? No No No    Functional Status Survey: Is the patient deaf or have difficulty hearing?: No Does the patient have difficulty seeing, even when wearing glasses/contacts?: No Does the patient have  difficulty concentrating, remembering, or making decisions?: No Does the patient have difficulty walking or climbing stairs?: No Does the patient have difficulty dressing or bathing?: No Does the patient have difficulty doing errands alone such as visiting a doctor's office or shopping?: No    Assessment & Plan   1. Mild episode of recurrent major depressive disorder ([place])  She does not want to go back on Luvox at this time - DULoxetine (CYMBALTA) 30 MG capsule; Take 1-2 capsules (30-60 mg total) by mouth daily. One daily for one week after that 2 daily  Dispense: 60 capsule; Refill: 0  2. Anxiety, generalized  - DULoxetine (CYMBALTA) 30 MG capsule; Take 1-2 capsules (30-60 mg total) by mouth daily. One daily for one week after that 2 daily  Dispense: 60 capsule; Refill: 0  3. Anorexia nervosa, restricting type  Stable   4. Needs flu shot  refused  5. Medial epicondylitis of both elbows  Discussed using braces, secondary to her job, discussed activity modification   6. Dysplasia of cervix, high grade CIN 2  - Ambulatory referral to Gynecology

## 2016-09-04 ENCOUNTER — Ambulatory Visit (INDEPENDENT_AMBULATORY_CARE_PROVIDER_SITE_OTHER): Payer: BLUE CROSS/BLUE SHIELD | Admitting: Family Medicine

## 2016-09-04 ENCOUNTER — Encounter: Payer: Self-pay | Admitting: Family Medicine

## 2016-09-04 VITALS — BP 110/68 | HR 84 | Temp 97.4°F | Resp 16 | Ht 67.0 in | Wt 147.5 lb

## 2016-09-04 DIAGNOSIS — Z131 Encounter for screening for diabetes mellitus: Secondary | ICD-10-CM

## 2016-09-04 DIAGNOSIS — E538 Deficiency of other specified B group vitamins: Secondary | ICD-10-CM | POA: Diagnosis not present

## 2016-09-04 DIAGNOSIS — Z1231 Encounter for screening mammogram for malignant neoplasm of breast: Secondary | ICD-10-CM

## 2016-09-04 DIAGNOSIS — Z01419 Encounter for gynecological examination (general) (routine) without abnormal findings: Secondary | ICD-10-CM

## 2016-09-04 DIAGNOSIS — N870 Mild cervical dysplasia: Secondary | ICD-10-CM

## 2016-09-04 DIAGNOSIS — D72829 Elevated white blood cell count, unspecified: Secondary | ICD-10-CM

## 2016-09-04 DIAGNOSIS — N871 Moderate cervical dysplasia: Secondary | ICD-10-CM | POA: Diagnosis not present

## 2016-09-04 DIAGNOSIS — F411 Generalized anxiety disorder: Secondary | ICD-10-CM | POA: Diagnosis not present

## 2016-09-04 DIAGNOSIS — F330 Major depressive disorder, recurrent, mild: Secondary | ICD-10-CM

## 2016-09-04 DIAGNOSIS — Z1239 Encounter for other screening for malignant neoplasm of breast: Secondary | ICD-10-CM

## 2016-09-04 DIAGNOSIS — R5383 Other fatigue: Secondary | ICD-10-CM | POA: Diagnosis not present

## 2016-09-04 DIAGNOSIS — Z0001 Encounter for general adult medical examination with abnormal findings: Secondary | ICD-10-CM | POA: Diagnosis not present

## 2016-09-04 DIAGNOSIS — N393 Stress incontinence (female) (male): Secondary | ICD-10-CM

## 2016-09-04 DIAGNOSIS — E559 Vitamin D deficiency, unspecified: Secondary | ICD-10-CM | POA: Diagnosis not present

## 2016-09-04 DIAGNOSIS — Z13220 Encounter for screening for lipoid disorders: Secondary | ICD-10-CM

## 2016-09-04 MED ORDER — DULOXETINE HCL 30 MG PO CPEP: 30.0000 mg | ORAL_CAPSULE | Freq: Every day | ORAL | 2 refills | Status: DC

## 2016-09-04 NOTE — Progress Notes (Signed)
Name: Monique [name]   MRN: [id number]    DOB: 1971-03-30   Date:09/04/2016       Progress Note  Subjective  Chief Complaint  Chief Complaint  Patient presents with  . Annual Exam    HPI  Well Woman: she is sexually active, last pap smear was abnormal and she has follow up with Dr. Hadassah Pais on 09/12/2016. She will schedule her mammogram. She denies vaginal discharge, but she has stress incontinence, but only has severe symptoms when sick.  GAD and Major depression: doing much better now, started on Cymbalta 30 mg 6 weeks ago and did not titrate dose because she is doing so much better. Denies side effects. No panic attacks. Able to focus at work and is sleeping well  Vitamin D and B12 deficiency: due for repeat labs   Patient Active Problem List   Diagnosis Date Noted  . B12 deficiency 07/10/2015  . Insomnia, persistent 07/10/2015  . Mild episode of recurrent major depressive disorder ([place]) 07/10/2015  . Anorexia nervosa, restricting type 07/10/2015  . Anxiety, generalized 07/10/2015  . H/O suicide attempt 07/10/2015  . Leukocytosis 07/10/2015  . Cervical dysplasia, mild 07/10/2015  . Obsessive-compulsive disorder 07/10/2015  . Tobacco use 07/10/2015  . Dysplasia of cervix, high grade CIN 2 07/18/2014  . Low grade squamous intraepithelial lesion (LGSIL) on cervical Pap smear 07/07/2014    Past Surgical History:  Procedure Laterality Date  . CERVICAL BIOPSY  W/ LOOP ELECTRODE EXCISION    . TUBAL LIGATION      Family History  Problem Relation Age of Onset  . Cancer Mother     thyroid  . Alcohol abuse Father   . Depression Sister   . Cancer Sister     cevical  . Alcohol abuse Brother   . Depression Brother   . Bipolar disorder Brother   . ADD / ADHD Son     Social History   Social History  . Marital status: Divorced    Spouse name: N/A  . Number of children: N/A  . Years of education: N/A   Occupational History  . Not on file.   Social History  Main Topics  . Smoking status: Current Every Day Smoker    Packs/day: 1.50  . Smokeless tobacco: Never Used  . Alcohol use No  . Drug use: No  . Sexual activity: Yes   Other Topics Concern  . Not on file   Social History Narrative  . No narrative on file     Current Outpatient Prescriptions:  .  DULoxetine (CYMBALTA) 30 MG capsule, Take 1 capsule (30 mg total) by mouth daily. Daily, Disp: 60 capsule, Rfl: 2 .  ibuprofen (ADVIL,MOTRIN) 600 MG tablet, Take 1 tablet (600 mg total) by mouth every 6 (six) hours as needed for moderate pain., Disp: 15 tablet, Rfl: 0  No Known Allergies   ROS  Constitutional: Negative for fever or significant  weight change.  Respiratory: Negative for cough and shortness of breath.   Cardiovascular: Negative for chest pain or palpitations.  Gastrointestinal: Negative for abdominal pain, no bowel changes.  Musculoskeletal: Negative for gait problem or joint swelling.  Skin: Negative for rash.  Neurological: Negative for dizziness or headache.  No other specific complaints in a complete review of systems (except as listed in HPI above).  Objective  Vitals:   09/04/16 1345  BP: 110/68  Pulse: 84  Resp: 16  Temp: 97.4 F (36.3 C)  TempSrc: Oral  SpO2: 91%  Weight: 147 lb 8 oz (66.9 kg)  Height: 5' 7"  (1.702 m)    Body mass index is 23.1 kg/m.  Physical Exam   Constitutional: Patient appears well-developed and well-nourished. No distress.  HENT: Head: Normocephalic and atraumatic. Ears: B TMs ok, no erythema or effusion; Nose: Nose normal. Mouth/Throat: Oropharynx is clear and moist. No oropharyngeal exudate.  Eyes: Conjunctivae and EOM are normal. Pupils are equal, round, and reactive to light. No scleral icterus.  Neck: Normal range of motion. Neck supple. No JVD present. No thyromegaly present.  Cardiovascular: Normal rate, regular rhythm and normal heart sounds.  No murmur heard. No BLE edema. Pulmonary/Chest: Effort normal and  breath sounds normal. No respiratory distress. Abdominal: Soft. Bowel sounds are normal, no distension. There is no tenderness. no masses Breast: no lumps or masses, no nipple discharge or rashes FEMALE GENITALIA:  Not done RECTAL: not done Musculoskeletal: Normal range of motion, no joint effusions. No gross deformities Neurological: he is alert and oriented to person, place, and time. No cranial nerve deficit. Coordination, balance, strength, speech and gait are normal.  Skin: Skin is warm and dry. No rash noted. No erythema.  Tattoos  Psychiatric: Patient has a normal mood and affect. behavior is normal. Judgment and thought content normal.  Recent Results (from the past 2160 hour(s))  Basic metabolic panel     Status: Abnormal   Collection Time: 06/29/16 11:18 PM  Result Value Ref Range   Sodium 139 135 - 145 mmol/L   Potassium 4.3 3.5 - 5.1 mmol/L   Chloride 104 101 - 111 mmol/L   CO2 27 22 - 32 mmol/L   Glucose, Bld 84 65 - 99 mg/dL   BUN 20 6 - 20 mg/dL   Creatinine, Ser 0.88 0.44 - 1.00 mg/dL   Calcium 8.7 (L) 8.9 - 10.3 mg/dL   GFR calc non Af Amer >60 >60 mL/min   GFR calc Af Amer >60 >60 mL/min    Comment: (NOTE) The eGFR has been calculated using the CKD EPI equation. This calculation has not been validated in all clinical situations. eGFR's persistently <60 mL/min signify possible Chronic Kidney Disease.    Anion gap 8 5 - 15  CK     Status: None   Collection Time: 06/29/16 11:18 PM  Result Value Ref Range   Total CK 61 38 - 234 U/L  Pregnancy, urine POC     Status: None   Collection Time: 06/29/16 11:35 PM  Result Value Ref Range   Preg Test, Ur NEGATIVE NEGATIVE    Comment:        THE SENSITIVITY OF THIS METHODOLOGY IS >24 mIU/mL       PHQ2/9: Depression screen [facility] 2/9 09/04/2016 08/14/2016 01/03/2016 07/12/2015  Decreased Interest 0 1 0 0  Down, Depressed, Hopeless 0 1 0 1  PHQ - 2 Score 0 2 0 1  Altered sleeping 0 1 - -  Tired, decreased energy 0  0 - -  Change in appetite 0 0 - -  Feeling bad or failure about yourself  0 0 - -  Trouble concentrating 0 0 - -  Moving slowly or fidgety/restless 0 0 - -  Suicidal thoughts 0 0 - -  PHQ-9 Score 0 3 - -  Difficult doing work/chores Not difficult at all Somewhat difficult - -     Fall Risk: Fall Risk  09/04/2016 08/14/2016 01/03/2016 07/12/2015  Falls in the past year? No No No No     Assessment &  Plan  1. Well woman exam  Discussed importance of 150 minutes of physical activity weekly, eat two servings of fish weekly, eat one serving of tree nuts ( cashews, pistachios, pecans, almonds.Marland Kitchen) every other day, eat 6 servings of fruit/vegetables daily and drink plenty of water and avoid sweet beverages.   2. Mild episode of recurrent major depressive disorder ([place])  She states she is feeling well and does not want to go up to 60 mg daily at this time - DULoxetine (CYMBALTA) 30 MG capsule; Take 1 capsule (30 mg total) by mouth daily. Daily  Dispense: 60 capsule; Refill: 2  3. Anxiety, generalized  - DULoxetine (CYMBALTA) 30 MG capsule; Take 1 capsule (30 mg total) by mouth daily. Daily  Dispense: 60 capsule; Refill: 2  4. Dysplasia of cervix, high grade CIN 2  She has an appointment with GYN for repeat pap smear 09/12/2016  5. Leukocytosis, unspecified type  -CBC  6. B12 deficiency  - Vitamin B12  7. Breast cancer screening  She will call [facility] to have mammogram scheduled  8. Lipid screening  - Lipid panel  9. Other fatigue  - CBC with Differential/Platelet - COMPLETE METABOLIC PANEL WITH GFR - TSH  10. Diabetes mellitus screening  - Hemoglobin A1c  11. Vitamin D deficiency  - VITAMIN D 25 Hydroxy (Vit-D Deficiency, Fractures)   12. Stress incontinence  Discussed [place] exercise

## 2016-09-12 ENCOUNTER — Ambulatory Visit (INDEPENDENT_AMBULATORY_CARE_PROVIDER_SITE_OTHER): Payer: BLUE CROSS/BLUE SHIELD | Admitting: Obstetrics and Gynecology

## 2016-09-12 ENCOUNTER — Encounter: Payer: Self-pay | Admitting: Obstetrics and Gynecology

## 2016-09-12 VITALS — BP 104/63 | HR 70 | Ht 67.0 in | Wt 147.2 lb

## 2016-09-12 DIAGNOSIS — R5383 Other fatigue: Secondary | ICD-10-CM | POA: Diagnosis not present

## 2016-09-12 DIAGNOSIS — Z8742 Personal history of other diseases of the female genital tract: Secondary | ICD-10-CM

## 2016-09-12 DIAGNOSIS — R87610 Atypical squamous cells of undetermined significance on cytologic smear of cervix (ASC-US): Secondary | ICD-10-CM | POA: Diagnosis not present

## 2016-09-12 DIAGNOSIS — Z131 Encounter for screening for diabetes mellitus: Secondary | ICD-10-CM | POA: Diagnosis not present

## 2016-09-12 DIAGNOSIS — E559 Vitamin D deficiency, unspecified: Secondary | ICD-10-CM | POA: Diagnosis not present

## 2016-09-12 DIAGNOSIS — E538 Deficiency of other specified B group vitamins: Secondary | ICD-10-CM | POA: Diagnosis not present

## 2016-09-12 DIAGNOSIS — Z87898 Personal history of other specified conditions: Secondary | ICD-10-CM

## 2016-09-12 DIAGNOSIS — R8789 Other abnormal findings in specimens from female genital organs: Secondary | ICD-10-CM | POA: Insufficient documentation

## 2016-09-12 DIAGNOSIS — R87618 Other abnormal cytological findings on specimens from cervix uteri: Secondary | ICD-10-CM | POA: Insufficient documentation

## 2016-09-12 LAB — COMPLETE METABOLIC PANEL WITH GFR
ALBUMIN: 4.1 g/dL (ref 3.6–5.1)
ALK PHOS: 59 U/L (ref 33–115)
ALT: 14 U/L (ref 6–29)
AST: 17 U/L (ref 10–35)
BILIRUBIN TOTAL: 0.6 mg/dL (ref 0.2–1.2)
BUN: 12 mg/dL (ref 7–25)
CO2: 27 mmol/L (ref 20–31)
Calcium: 9.1 mg/dL (ref 8.6–10.2)
Chloride: 101 mmol/L (ref 98–110)
Creat: 0.76 mg/dL (ref 0.50–1.10)
Glucose, Bld: 80 mg/dL (ref 65–99)
POTASSIUM: 4.6 mmol/L (ref 3.5–5.3)
SODIUM: 137 mmol/L (ref 135–146)
TOTAL PROTEIN: 6.8 g/dL (ref 6.1–8.1)

## 2016-09-12 LAB — LIPID PANEL
CHOLESTEROL: 158 mg/dL (ref <200)
HDL: 40 mg/dL — ABNORMAL LOW (ref >50)
LDL Cholesterol: 103 mg/dL — ABNORMAL HIGH (ref <100)
TRIGLYCERIDES: 76 mg/dL (ref <150)
Total CHOL/HDL Ratio: 4 Ratio (ref <5.0)
VLDL: 15 mg/dL (ref <30)

## 2016-09-12 LAB — TSH: TSH: 1.97 m[IU]/L

## 2016-09-12 NOTE — Progress Notes (Signed)
   GYNECOLOGY CLINIC PROGRESS NOTE  Subjective:     Monique [name] is a 45 y.o. woman who comes in today for a  pap smear only. Her most recent annual exam was on 12/20/12017. Her most recent Pap smear was on 07/12/2015 and showed low-grade squamous intraepithelial neoplasia (LGSIL - encompassing HPV,mild dysplasia,CIN I). Is s/p colposcopy in 08/2015 with negative biopsy and ECC.  Contraception: tubal ligation    Past Medical History:  Diagnosis Date  . Anxiety   . Cervical intraepithelial neoplasia I   . Depression   . Eating disorder   . History of self-harm   . Insomnia   . Obsession   . Pap smear abnormality of cervix with LGSIL   . Tobacco abuse   . Vitamin B12 deficiency (non anemic)    Family History  Problem Relation Age of Onset  . Cancer Mother     thyroid  . Alcohol abuse Father   . Depression Sister   . Cancer Sister     cevical  . Alcohol abuse Brother   . Depression Brother   . Bipolar disorder Brother   . ADD / ADHD Son     Past Surgical History:  Procedure Laterality Date  . CERVICAL BIOPSY  W/ LOOP ELECTRODE EXCISION    . TUBAL LIGATION      Social History   Social History  . Marital status: Divorced    Spouse name: N/A  . Number of children: N/A  . Years of education: N/A   Occupational History  . Not on file.   Social History Main Topics  . Smoking status: Current Every Day Smoker    Packs/day: 1.50  . Smokeless tobacco: Never Used  . Alcohol use No  . Drug use: No  . Sexual activity: Yes    Birth control/ protection: None   Other Topics Concern  . Not on file   Social History Narrative  . No narrative on file    Current Outpatient Prescriptions on File Prior to Visit  Medication Sig Dispense Refill  . DULoxetine (CYMBALTA) 30 MG capsule Take 1 capsule (30 mg total) by mouth daily. Daily 60 capsule 2  . ibuprofen (ADVIL,MOTRIN) 600 MG tablet Take 1 tablet (600 mg total) by mouth every 6 (six) hours as needed for moderate  pain. 15 tablet 0   No current facility-administered medications on file prior to visit.     No Known Allergies   Review of Systems A comprehensive review of systems was negative.   Objective:    BP 104/63 (BP Location: Left Arm, Patient Position: Sitting, Cuff Size: Normal)   Pulse 70   Ht 5\' 7"  (1.702 m)   Wt 147 lb 3.2 oz (66.8 kg)   LMP 09/04/2016 (Exact Date)   BMI 23.05 kg/m  Pelvic Exam: cervix normal in appearance, external genitalia normal and vagina normal without discharge. Pap smear obtained.   Assessment:     History of abnormal pap smear (LGSIL)   Plan:   Call in 1 week for results, will schedule further follow up as indicated by Pap results.     Rubie Maid, MD [facility] 09/12/2016 11:00 AM

## 2016-09-12 NOTE — Addendum Note (Signed)
Addended by: Gordy Clement C on: 09/12/2016 11:03 AM   Modules accepted: Orders

## 2016-09-13 LAB — CBC WITH DIFFERENTIAL/PLATELET
Basophils Absolute: 0 {cells}/uL (ref 0–200)
Basophils Relative: 0 %
Eosinophils Absolute: 0 {cells}/uL — ABNORMAL LOW (ref 15–500)
Eosinophils Relative: 0 %
HCT: 43.9 % (ref 35.0–45.0)
Hemoglobin: 14.6 g/dL (ref 11.7–15.5)
Lymphocytes Relative: 80 %
Lymphs Abs: 22240 {cells}/uL — ABNORMAL HIGH (ref 850–3900)
MCH: 32.4 pg (ref 27.0–33.0)
MCHC: 33.3 g/dL (ref 32.0–36.0)
MCV: 97.3 fL (ref 80.0–100.0)
MPV: 10.1 fL (ref 7.5–12.5)
Monocytes Absolute: 834 {cells}/uL (ref 200–950)
Monocytes Relative: 3 %
Neutro Abs: 4726 {cells}/uL (ref 1500–7800)
Neutrophils Relative %: 17 %
Platelets: 268 K/uL (ref 140–400)
RBC: 4.51 MIL/uL (ref 3.80–5.10)
RDW: 13.2 % (ref 11.0–15.0)
WBC: 27.8 K/uL — ABNORMAL HIGH (ref 3.8–10.8)

## 2016-09-13 LAB — HEMOGLOBIN A1C
Hgb A1c MFr Bld: 5.3 % (ref <5.7)
MEAN PLASMA GLUCOSE: 105 mg/dL

## 2016-09-13 LAB — VITAMIN D 25 HYDROXY (VIT D DEFICIENCY, FRACTURES): VIT D 25 HYDROXY: 45 ng/mL (ref 30–100)

## 2016-09-13 LAB — PATHOLOGIST SMEAR REVIEW

## 2016-09-13 LAB — VITAMIN B12: Vitamin B-12: 452 pg/mL (ref 200–1100)

## 2016-09-14 ENCOUNTER — Other Ambulatory Visit: Payer: Self-pay | Admitting: Family Medicine

## 2016-09-14 DIAGNOSIS — D72820 Lymphocytosis (symptomatic): Secondary | ICD-10-CM

## 2016-09-14 LAB — PAP IG AND HPV HIGH-RISK
HPV, HIGH-RISK: POSITIVE — AB
PAP SMEAR COMMENT: 0

## 2016-09-16 DIAGNOSIS — C9110 Chronic lymphocytic leukemia of B-cell type not having achieved remission: Secondary | ICD-10-CM

## 2016-09-16 HISTORY — DX: Chronic lymphocytic leukemia of B-cell type not having achieved remission: C91.10

## 2016-09-18 ENCOUNTER — Telehealth: Payer: Self-pay

## 2016-09-18 NOTE — Telephone Encounter (Signed)
Monique [name] called you back for results @ 10:15 am. You were not at your desk at time of call.

## 2016-09-18 NOTE — Telephone Encounter (Signed)
Called pt informed her of the need for colpo, had pt transferred to reception to be scheduled.

## 2016-09-18 NOTE — Telephone Encounter (Signed)
Called pt no answer. LM for pt to call back.  

## 2016-09-18 NOTE — Telephone Encounter (Signed)
-----   Message from Rubie Maid, MD sent at 09/14/2016 12:25 PM EST ----- Please inform patient that pap smear returned abnormal (ASCUS HR HPV+) again.  Will need to repeat colposcopy.  To schedule.

## 2016-09-23 ENCOUNTER — Emergency Department: Payer: BLUE CROSS/BLUE SHIELD

## 2016-09-23 ENCOUNTER — Telehealth: Payer: Self-pay | Admitting: Family Medicine

## 2016-09-23 ENCOUNTER — Emergency Department
Admission: EM | Admit: 2016-09-23 | Discharge: 2016-09-23 | Disposition: A | Discharge status: Home / Self Care | Payer: BLUE CROSS/BLUE SHIELD | Attending: Emergency Medicine | Admitting: Emergency Medicine

## 2016-09-23 ENCOUNTER — Encounter: Payer: Self-pay | Admitting: *Deleted

## 2016-09-23 DIAGNOSIS — M79604 Pain in right leg: Secondary | ICD-10-CM

## 2016-09-23 DIAGNOSIS — Z791 Long term (current) use of non-steroidal anti-inflammatories (NSAID): Secondary | ICD-10-CM | POA: Diagnosis not present

## 2016-09-23 DIAGNOSIS — R05 Cough: Secondary | ICD-10-CM | POA: Diagnosis not present

## 2016-09-23 DIAGNOSIS — M79651 Pain in right thigh: Secondary | ICD-10-CM

## 2016-09-23 DIAGNOSIS — F17210 Nicotine dependence, cigarettes, uncomplicated: Secondary | ICD-10-CM | POA: Insufficient documentation

## 2016-09-23 LAB — COMPREHENSIVE METABOLIC PANEL
ALK PHOS: 49 U/L (ref 38–126)
ALT: 16 U/L (ref 14–54)
AST: 22 U/L (ref 15–41)
Albumin: 3.9 g/dL (ref 3.5–5.0)
Anion gap: 5 (ref 5–15)
BUN: 18 mg/dL (ref 6–20)
CHLORIDE: 107 mmol/L (ref 101–111)
CO2: 26 mmol/L (ref 22–32)
CREATININE: 0.8 mg/dL (ref 0.44–1.00)
Calcium: 8.6 mg/dL — ABNORMAL LOW (ref 8.9–10.3)
GFR calc Af Amer: >60 mL/min (ref >60)
GFR calc non Af Amer: >60 mL/min (ref >60)
GLUCOSE: 68 mg/dL (ref 65–99)
Potassium: 4.4 mmol/L (ref 3.5–5.1)
SODIUM: 138 mmol/L (ref 135–145)
Total Bilirubin: 0.5 mg/dL (ref 0.3–1.2)
Total Protein: 6.6 g/dL (ref 6.5–8.1)

## 2016-09-23 LAB — CBC WITH DIFFERENTIAL/PLATELET
BAND NEUTROPHILS: 0 %
BASOS PCT: 0 %
Basophils Absolute: 0 10*3/uL (ref 0–0.1)
Blasts: 0 %
EOS ABS: 0 10*3/uL (ref 0–0.7)
Eosinophils Relative: 0 %
HCT: 41.2 % (ref 35.0–47.0)
HEMOGLOBIN: 13.6 g/dL (ref 12.0–16.0)
LYMPHS PCT: 71 %
Lymphs Abs: 17.7 10*3/uL — ABNORMAL HIGH (ref 1.0–3.6)
MCH: 32.4 pg (ref 26.0–34.0)
MCHC: 33.1 g/dL (ref 32.0–36.0)
MCV: 97.9 fL (ref 80.0–100.0)
MONO ABS: 2 10*3/uL — AB (ref 0.2–0.9)
MYELOCYTES: 0 %
Metamyelocytes Relative: 0 %
Monocytes Relative: 8 %
NEUTROS PCT: 21 %
NRBC: 0 /100{WBCs}
Neutro Abs: 5.3 10*3/uL (ref 1.4–6.5)
Other: 0 %
PLATELETS: 210 10*3/uL (ref 150–440)
PROMYELOCYTES ABS: 0 %
RBC: 4.21 MIL/uL (ref 3.80–5.20)
RDW: 13.1 % (ref 11.5–14.5)
WBC: 25 10*3/uL — ABNORMAL HIGH (ref 3.6–11.0)

## 2016-09-23 LAB — CK: Total CK: 62 U/L (ref 38–234)

## 2016-09-23 MED ORDER — SODIUM CHLORIDE 0.9 % IV BOLUS (SEPSIS): 1000.0000 mL | Freq: Once | INTRAVENOUS | Status: DC

## 2016-09-23 MED ORDER — HYDROCODONE-ACETAMINOPHEN 5-325 MG PO TABS
1.0000 | ORAL_TABLET | Freq: Once | ORAL | Status: AC
  Administered 2016-09-23: 1 via ORAL
  Filled 2016-09-23: qty 1

## 2016-09-23 MED ORDER — HYDROCODONE-ACETAMINOPHEN 5-325 MG PO TABS: 1.0000 | ORAL_TABLET | ORAL | 0 refills | Status: DC | PRN

## 2016-09-23 MED ORDER — CYCLOBENZAPRINE HCL 10 MG PO TABS
10.0000 mg | ORAL_TABLET | Freq: Once | ORAL | Status: AC
  Administered 2016-09-23: 10 mg via ORAL
  Filled 2016-09-23: qty 1

## 2016-09-23 MED ORDER — IBUPROFEN 400 MG PO TABS
400.0000 mg | ORAL_TABLET | Freq: Once | ORAL | Status: AC | PRN
  Administered 2016-09-23: 400 mg via ORAL
  Filled 2016-09-23: qty 1

## 2016-09-23 MED ORDER — KETOROLAC TROMETHAMINE 30 MG/ML IJ SOLN
30.0000 mg | Freq: Once | INTRAMUSCULAR | Status: AC
  Administered 2016-09-23: 30 mg via INTRAVENOUS
  Filled 2016-09-23: qty 1

## 2016-09-23 NOTE — ED Provider Notes (Signed)
[facility] Emergency Department Provider Note  Time seen: 8:16 PM  I have reviewed the triage vital signs and the nursing notes.   HISTORY  Chief Complaint Leg Pain    HPI Monique [name] is a 46 y.o. female with a past medical history of anxiety, presents the emergency department with right thigh pain. According to the patient since last night she has been experiencing significant right thigh pain with significant pain with attempted ambulation or bending of the leg. Patient states she has been having on-and-off pain in the right thigh since September. She has been seen now 3 times in the emergency department for the same. States she has had negative ultrasounds, no history of DVT. No leg swelling. Denies any nausea, vomiting, diarrhea. States last week she had a fever with cough continues to have a mild cough but states the fever has resolved. Patient states in December she had blood work done which showed a very elevated white blood cell count and has an appointment with hematology in approximately 10 days. Patient states the leg pain has become very severe so she came to the emergency department for evaluation.  Past Medical History:  Diagnosis Date  . Anxiety   . Cervical intraepithelial neoplasia I   . Depression   . Eating disorder   . History of self-harm   . Insomnia   . Obsession   . Pap smear abnormality of cervix with LGSIL   . Tobacco abuse   . Vitamin B12 deficiency (non anemic)     Patient Active Problem List   Diagnosis Date Noted  . Stress incontinence 09/04/2016  . B12 deficiency 07/10/2015  . Insomnia, persistent 07/10/2015  . Mild episode of recurrent major depressive disorder ([place]) 07/10/2015  . Anorexia nervosa, restricting type 07/10/2015  . Anxiety, generalized 07/10/2015  . H/O suicide attempt 07/10/2015  . Leukocytosis 07/10/2015  . Cervical dysplasia, mild 07/10/2015  . Obsessive-compulsive disorder 07/10/2015  . Tobacco  use 07/10/2015  . Dysplasia of cervix, high grade CIN 2 07/18/2014  . Low grade squamous intraepithelial lesion (LGSIL) on cervical Pap smear 07/07/2014    Past Surgical History:  Procedure Laterality Date  . CERVICAL BIOPSY  W/ LOOP ELECTRODE EXCISION    . TUBAL LIGATION      Prior to Admission medications   Medication Sig Start Date End Date Taking? Authorizing Provider  DULoxetine (CYMBALTA) 30 MG capsule Take 1 capsule (30 mg total) by mouth daily. Daily 09/04/16   [name], MD  ibuprofen (ADVIL,MOTRIN) 600 MG tablet Take 1 tablet (600 mg total) by mouth every 6 (six) hours as needed for moderate pain. 06/30/16   Joanne Gavel, MD    No Known Allergies  Family History  Problem Relation Age of Onset  . Cancer Mother     thyroid  . Alcohol abuse Father   . Depression Sister   . Cancer Sister     cevical  . Alcohol abuse Brother   . Depression Brother   . Bipolar disorder Brother   . ADD / ADHD Son     Social History Social History  Substance Use Topics  . Smoking status: Current Every Day Smoker    Packs/day: 1.50  . Smokeless tobacco: Never Used  . Alcohol use No    Review of Systems Constitutional: Negative for fever. Cardiovascular: Negative for chest pain. Respiratory: Negative for shortness of breath.Occasional cough. Gastrointestinal: Negative for abdominal pain Neurological: Negative for headache 10-point ROS otherwise negative.  ____________________________________________  PHYSICAL EXAM:  VITAL SIGNS: ED Triage Vitals  Enc Vitals Group     BP 09/23/16 1943 116/74     Pulse Rate 09/23/16 1943 68     Resp 09/23/16 1943 20     Temp 09/23/16 1943 98.2 F (36.8 C)     Temp Source 09/23/16 1943 Oral     SpO2 09/23/16 1943 100 %     Weight 09/23/16 1739 147 lb (66.7 kg)     Height 09/23/16 1739 5\' 7"  (1.702 m)     Head Circumference --      Peak Flow --      Pain Score 09/23/16 1739 6     Pain Loc --      Pain Edu? --      Excl. in  [place]? --     Constitutional: Alert and oriented. Well appearing and in no distress. Eyes: Normal exam ENT   Head: Normocephalic and atraumatic.   Mouth/Throat: Mucous membranes are moist. Cardiovascular: Normal rate, regular rhythm. No murmur Respiratory: Normal respiratory effort without tachypnea nor retractions. Breath sounds are clear Gastrointestinal: Soft and nontender. No distention.  Musculoskeletal: Mild thigh tenderness to palpation in the anterior lateral portion of the right thigh. 1+ DP pulse, equal bilaterally. No lower extremity edema noted. No calf tenderness. Neurologically intact. Neurologic:  Normal speech and language. No gross focal neurologic deficits Skin:  Skin is warm, dry and intact.  Psychiatric: Mood and affect are normal.   ____________________________________________     RADIOLOGY  Ultrasound negative X-rays are negative. ____________________________________________   INITIAL IMPRESSION / ASSESSMENT AND PLAN / ED COURSE  Pertinent labs & imaging results that were available during my care of the patient were reviewed by me and considered in my medical decision making (see chart for details).  Patient presents for right thigh pain worse since last night, but has been on and off for the past several months. This is blood work in December shows a leukocytosis of 27,000 concerning for oncologic process. Patient has not yet been seen by the cancer center. Given the patient's continued right leg pain we'll proceed with imaging of the right lower extremity to rule out cancerous process such as osteosarcoma.  Ultrasound is negative. Patient's femur x-ray and chest x-ray are negative. We will discharge the patient with Norco for pain control and have her follow up with hematology neck week as scheduled. Patient agreeable to plan.  ____________________________________________   FINAL CLINICAL IMPRESSION(S) / ED DIAGNOSES  Right thigh  pain Musculoskeletal pain   Harvest Dark, MD 09/23/16 2121

## 2016-09-23 NOTE — Discharge Instructions (Signed)
Please take your pain medication as needed, as prescribed. You may also take ibuprofen/Motrin as written on the box for discomfort. Please follow-up with the cancer center next week as scheduled. Return to the emergency department for any worsening pain, fever, or any other symptom personally concerning to yourself.

## 2016-09-23 NOTE — Telephone Encounter (Signed)
Patient states that she has been having leg pain. It starts in the crease (pelvic area) and then radiates about a hand length down. This began last night and she took two 500 mg of tylenol last night and this morning took two more this morning. About 2 hours ago she took 3 ibuprofen. Patient states that she has been to the ER twice for this but it was two different months. I did offer patient an appointment for tomorrow however she wanted me to ask you should she go back to the ER. Was once told that it was a pulled muscle a month later a [place] was done checking for clot but nothing was found. Please advise

## 2016-09-23 NOTE — ED Triage Notes (Signed)
Pt has pain in right upper leg radiaiting into right lower leg.  Similar sx 2 months ago and had negative u/s.  Sx began again last night and pt worked a 9 hour shift today.  No chest pain or sob.  Pt alert.

## 2016-09-23 NOTE — Telephone Encounter (Signed)
Advised Tiffany to tell patient to go to [facility] today, patient notified

## 2016-09-23 NOTE — ED Notes (Signed)
U/s negative for DT; pt changed to flex wait

## 2016-09-25 ENCOUNTER — Ambulatory Visit (INDEPENDENT_AMBULATORY_CARE_PROVIDER_SITE_OTHER): Payer: BLUE CROSS/BLUE SHIELD | Admitting: Family Medicine

## 2016-09-25 ENCOUNTER — Encounter: Payer: Self-pay | Admitting: Family Medicine

## 2016-09-25 VITALS — BP 108/62 | HR 79 | Temp 98.8°F | Resp 16 | Ht 67.0 in | Wt 148.5 lb

## 2016-09-25 DIAGNOSIS — M25551 Pain in right hip: Secondary | ICD-10-CM | POA: Diagnosis not present

## 2016-09-25 MED ORDER — GABAPENTIN 100 MG PO CAPS: 100.0000 mg | ORAL_CAPSULE | Freq: Every day | ORAL | 0 refills | Status: DC

## 2016-09-25 NOTE — Progress Notes (Signed)
Name: Monique [name]   MRN: [id number]    DOB: Aug 08, 1971   Date:09/25/2016       Progress Note  Subjective  Chief Complaint  Chief Complaint  Patient presents with  . Leg Pain    Onset- 4 days, Right Leg-Thigh radiates down to her calf. Her Feet are so numb from the shooting pain. Patient has been to the ER and performed a X-Ray, [place] and blood work and gave her pain medication with no relief. Patient states they did not diagnose her symptoms.     HPI  Right thigh pain: she has been having recurrent episodes of right quad pain since Fall on 2017. She has been to the [facility] three times, last one was two days ago. She describes the pain as unbearable, difficulty bearing weight on right leg. Pain can improve and at times gets with pressure. No back pain. No numbness , burning or tingling sensation at this time, but the first episode was associated with tingling going down to right medial calf. Pain is described as aching at this time, sitting 8/10, but worse when standing. Pain radiates to right groin at times. No bowel or bladder incontinence. No fever. No dysuria.    Patient Active Problem List   Diagnosis Date Noted  . Stress incontinence 09/04/2016  . B12 deficiency 07/10/2015  . Insomnia, persistent 07/10/2015  . Mild episode of recurrent major depressive disorder ([place]) 07/10/2015  . Anorexia nervosa, restricting type 07/10/2015  . Anxiety, generalized 07/10/2015  . H/O suicide attempt 07/10/2015  . Leukocytosis 07/10/2015  . Cervical dysplasia, mild 07/10/2015  . Obsessive-compulsive disorder 07/10/2015  . Tobacco use 07/10/2015  . Dysplasia of cervix, high grade CIN 2 07/18/2014  . Low grade squamous intraepithelial lesion (LGSIL) on cervical Pap smear 07/07/2014    Past Surgical History:  Procedure Laterality Date  . CERVICAL BIOPSY  W/ LOOP ELECTRODE EXCISION    . TUBAL LIGATION      Family History  Problem Relation Age of Onset  . Cancer Mother     thyroid  . Alcohol  abuse Father   . Depression Sister   . Cancer Sister     cevical  . Alcohol abuse Brother   . Depression Brother   . Bipolar disorder Brother   . ADD / ADHD Son     Social History   Social History  . Marital status: Divorced    Spouse name: N/A  . Number of children: N/A  . Years of education: N/A   Occupational History  . Not on file.   Social History Main Topics  . Smoking status: Current Every Day Smoker    Packs/day: 1.00    Years: 30.00    Start date: 09/25/1986  . Smokeless tobacco: Never Used  . Alcohol use 0.0 oz/week     Comment: rarely  . Drug use: No  . Sexual activity: Yes    Partners: Male    Birth control/ protection: None   Other Topics Concern  . Not on file   Social History Narrative  . No narrative on file     Current Outpatient Prescriptions:  .  DULoxetine (CYMBALTA) 30 MG capsule, Take 1 capsule (30 mg total) by mouth daily. Daily, Disp: 60 capsule, Rfl: 2 .  HYDROcodone-acetaminophen (NORCO/VICODIN) 5-325 MG tablet, Take 1 tablet by mouth every 4 (four) hours as needed for moderate pain., Disp: 20 tablet, Rfl: 0 .  ibuprofen (ADVIL,MOTRIN) 600 MG tablet, Take 1 tablet (600 mg total) by  mouth every 6 (six) hours as needed for moderate pain., Disp: 15 tablet, Rfl: 0  No Known Allergies   ROS  Ten systems reviewed and is negative except as mentioned in HPI   Objective  Vitals:   09/25/16 1500  BP: 108/62  Pulse: 79  Resp: 16  Temp: 98.8 F (37.1 C)  TempSrc: Oral  SpO2: 97%  Weight: 148 lb 8 oz (67.4 kg)  Height: 5' 7" (1.702 m)    Body mass index is 23.26 kg/m.  Physical Exam  Constitutional: Patient appears well-developed and well-nourished. In acute distress. Crying and unable to bear weight on right leg HEENT: head atraumatic, normocephalic, pupils equal and reactive to light,  neck supple, throat within normal limits Cardiovascular: Normal rate, regular rhythm and normal heart sounds.  No murmur heard. No BLE  edema. Pulmonary/Chest: Effort normal and breath sounds normal. No respiratory distress. Abdominal: Soft.  There is no tenderness. Psychiatric: Judgment and thought content normal. Muscular Skeletal: pain on quad with leg movement, but no pain on hip with internal or external rotation. No pain during palpation of back, negative straight leg raise  Recent Results (from the past 2160 hour(s))  Basic metabolic panel     Status: Abnormal   Collection Time: 06/29/16 11:18 PM  Result Value Ref Range   Sodium 139 135 - 145 mmol/L   Potassium 4.3 3.5 - 5.1 mmol/L   Chloride 104 101 - 111 mmol/L   CO2 27 22 - 32 mmol/L   Glucose, Bld 84 65 - 99 mg/dL   BUN 20 6 - 20 mg/dL   Creatinine, Ser 0.88 0.44 - 1.00 mg/dL   Calcium 8.7 (L) 8.9 - 10.3 mg/dL   GFR calc non Af Amer >60 >60 mL/min   GFR calc Af Amer >60 >60 mL/min    Comment: (NOTE) The eGFR has been calculated using the CKD EPI equation. This calculation has not been validated in all clinical situations. eGFR's persistently <60 mL/min signify possible Chronic Kidney Disease.    Anion gap 8 5 - 15  CK     Status: None   Collection Time: 06/29/16 11:18 PM  Result Value Ref Range   Total CK 61 38 - 234 U/L  Pregnancy, urine POC     Status: None   Collection Time: 06/29/16 11:35 PM  Result Value Ref Range   Preg Test, Ur NEGATIVE NEGATIVE    Comment:        THE SENSITIVITY OF THIS METHODOLOGY IS >24 mIU/mL   CBC with Differential/Platelet     Status: Abnormal   Collection Time: 09/12/16  9:48 AM  Result Value Ref Range   WBC 27.8 (H) 3.8 - 10.8 K/uL   RBC 4.51 3.80 - 5.10 MIL/uL   Hemoglobin 14.6 11.7 - 15.5 g/dL   HCT 43.9 35.0 - 45.0 %   MCV 97.3 80.0 - 100.0 fL   MCH 32.4 27.0 - 33.0 pg   MCHC 33.3 32.0 - 36.0 g/dL   RDW 13.2 11.0 - 15.0 %   Platelets 268 140 - 400 K/uL   MPV 10.1 7.5 - 12.5 fL   Neutro Abs 4,726 1,500 - 7,800 cells/uL   Lymphs Abs 22,240 (H) 850 - 3,900 cells/uL   Monocytes Absolute 834 200 - 950  cells/uL   Eosinophils Absolute 0 (L) 15 - 500 cells/uL   Basophils Absolute 0 0 - 200 cells/uL   Neutrophils Relative % 17 %   Lymphocytes Relative 80 %   Monocytes Relative   3 %   Eosinophils Relative 0 %   Basophils Relative 0 %   Smear Review       Comment: Pending path review  COMPLETE METABOLIC PANEL WITH GFR     Status: None   Collection Time: 09/12/16  9:48 AM  Result Value Ref Range   Sodium 137 135 - 146 mmol/L   Potassium 4.6 3.5 - 5.3 mmol/L   Chloride 101 98 - 110 mmol/L   CO2 27 20 - 31 mmol/L   Glucose, Bld 80 65 - 99 mg/dL   BUN 12 7 - 25 mg/dL   Creat 0.76 0.50 - 1.10 mg/dL   Total Bilirubin 0.6 0.2 - 1.2 mg/dL   Alkaline Phosphatase 59 33 - 115 U/L   AST 17 10 - 35 U/L   ALT 14 6 - 29 U/L   Total Protein 6.8 6.1 - 8.1 g/dL   Albumin 4.1 3.6 - 5.1 g/dL   Calcium 9.1 8.6 - 10.2 mg/dL   GFR, Est African American >89 >=60 mL/min   GFR, Est Non African American >89 >=60 mL/min  Hemoglobin A1c     Status: None   Collection Time: 09/12/16  9:48 AM  Result Value Ref Range   Hgb A1c MFr Bld 5.3 <5.7 %    Comment:   For the purpose of screening for the presence of diabetes:   <5.7%       Consistent with the absence of diabetes 5.7-6.4 %   Consistent with increased risk for diabetes (prediabetes) >=6.5 %     Consistent with diabetes   This assay result is consistent with a decreased risk of diabetes.   Currently, no consensus exists regarding use of hemoglobin A1c for diagnosis of diabetes in children.   According to American Diabetes Association (ADA) guidelines, hemoglobin A1c <7.0% represents optimal control in non-pregnant diabetic patients. Different metrics may apply to specific patient populations. Standards of Medical Care in Diabetes (ADA).      Mean Plasma Glucose 105 mg/dL  Lipid panel     Status: Abnormal   Collection Time: 09/12/16  9:48 AM  Result Value Ref Range   Cholesterol 158 <200 mg/dL   Triglycerides 76 <150 mg/dL   HDL 40 (L) >50  mg/dL   Total CHOL/HDL Ratio 4.0 <5.0 Ratio   VLDL 15 <30 mg/dL   LDL Cholesterol 103 (H) <100 mg/dL  Vitamin B12     Status: None   Collection Time: 09/12/16  9:48 AM  Result Value Ref Range   Vitamin B-12 452 200 - 1,100 pg/mL  VITAMIN D 25 Hydroxy (Vit-D Deficiency, Fractures)     Status: None   Collection Time: 09/12/16  9:48 AM  Result Value Ref Range   Vit D, 25-Hydroxy 45 30 - 100 ng/mL    Comment: Vitamin D Status           25-OH Vitamin D        Deficiency                <20 ng/mL        Insufficiency         20 - 29 ng/mL        Optimal             > or = 30 ng/mL   For 25-OH Vitamin D testing on patients on D2-supplementation and patients for whom quantitation of D2 and D3 fractions is required, the QuestAssureD 25-OH VIT D, (D2,D3), LC/MS/MS is recommended: order code   85814 (patients > 2 yrs).   TSH     Status: None   Collection Time: 09/12/16  9:48 AM  Result Value Ref Range   TSH 1.97 mIU/L    Comment:   Reference Range   > or = 20 Years  0.40-4.50   Pregnancy Range First trimester  0.26-2.66 Second trimester 0.55-2.73 Third trimester  0.43-2.91     Pathologist smear review     Status: None   Collection Time: 09/12/16  9:48 AM  Result Value Ref Range   Path Review SEE NOTE     Comment: Absolute lymphocytosis and atypical lymphocytes, suggestive of a lymphoproliferative process. Suggest immunophenotyping by flow cytometry for further evaluation. RBCs and platelets are unremarkable. Reviewed by Marisa C. Mammarappallil MD (Electronic Signature on File) 09/13/2016   Pap IG and HPV (high risk) DNA detection     Status: Abnormal   Collection Time: 09/12/16 11:03 AM  Result Value Ref Range   DIAGNOSIS: Comment (A)     Comment: EPITHELIAL CELL ABNORMALITY. ATYPICAL SQUAMOUS CELLS OF UNDETERMINED SIGNIFICANCE.    Recommendation: Comment (A)     Comment: Suggest follow up as clinically appropriate.   Specimen adequacy: Comment     Comment: Satisfactory  for evaluation. Endocervical and/or squamous metaplastic cells (endocervical component) are present.    CLINICIAN PROVIDED ICD10: Comment     Comment: Z87.898   Performed by: Comment     Comment: Jaleesa Vann, Cytotechnologist (ASCP)   Electronically signed by: Comment     Comment: Maleka Qayumi, MD, Pathologist   PAP SMEAR COMMENT .    PATHOLOGIST PROVIDED ICD10: Comment     Comment: R87.610   Note: Comment     Comment: The Pap smear is a screening test designed to aid in the detection of premalignant and malignant conditions of the uterine cervix.  It is not a diagnostic procedure and should not be used as the sole means of detecting cervical cancer.  Both false-positive and false-negative reports do occur.    Test Methodology Comment     Comment: This liquid based ThinPrep(R) pap test was screened with the use of an image guided system.    HPV, high-risk Positive (A) Negative    Comment: This high-risk HPV test detects thirteen high-risk types (16/18/31/33/35/39/45/51/52/56/58/59/68) without differentiation.   CBC with Differential     Status: Abnormal   Collection Time: 09/23/16  8:34 PM  Result Value Ref Range   WBC 25.0 (H) 3.6 - 11.0 K/uL   RBC 4.21 3.80 - 5.20 MIL/uL   Hemoglobin 13.6 12.0 - 16.0 g/dL   HCT 41.2 35.0 - 47.0 %   MCV 97.9 80.0 - 100.0 fL   MCH 32.4 26.0 - 34.0 pg   MCHC 33.1 32.0 - 36.0 g/dL   RDW 13.1 11.5 - 14.5 %   Platelets 210 150 - 440 K/uL   Neutrophils Relative % 21 %   Lymphocytes Relative 71 %   Monocytes Relative 8 %   Eosinophils Relative 0 %   Basophils Relative 0 %   Band Neutrophils 0 %   Metamyelocytes Relative 0 %   Myelocytes 0 %   Promyelocytes Absolute 0 %   Blasts 0 %   nRBC 0 0 /100 WBC   Other 0 %   Neutro Abs 5.3 1.4 - 6.5 K/uL   Lymphs Abs 17.7 (H) 1.0 - 3.6 K/uL   Monocytes Absolute 2.0 (H) 0.2 - 0.9 K/uL   Eosinophils Absolute 0.0 0 - 0.7 K/uL     Basophils Absolute 0.0 0 - 0.1 K/uL   WBC Morphology ATYPICAL  LYMPHOCYTES     Comment: SMUDGE CELLS   Smear Review MORPHOLOGY UNREMARKABLE   Comprehensive metabolic panel     Status: Abnormal   Collection Time: 09/23/16  8:34 PM  Result Value Ref Range   Sodium 138 135 - 145 mmol/L   Potassium 4.4 3.5 - 5.1 mmol/L   Chloride 107 101 - 111 mmol/L   CO2 26 22 - 32 mmol/L   Glucose, Bld 68 65 - 99 mg/dL   BUN 18 6 - 20 mg/dL   Creatinine, Ser 0.80 0.44 - 1.00 mg/dL   Calcium 8.6 (L) 8.9 - 10.3 mg/dL   Total Protein 6.6 6.5 - 8.1 g/dL   Albumin 3.9 3.5 - 5.0 g/dL   AST 22 15 - 41 U/L   ALT 16 14 - 54 U/L   Alkaline Phosphatase 49 38 - 126 U/L   Total Bilirubin 0.5 0.3 - 1.2 mg/dL   GFR calc non Af Amer >60 >60 mL/min   GFR calc Af Amer >60 >60 mL/min    Comment: (NOTE) The eGFR has been calculated using the CKD EPI equation. This calculation has not been validated in all clinical situations. eGFR's persistently <60 mL/min signify possible Chronic Kidney Disease.    Anion gap 5 5 - 15  CK     Status: None   Collection Time: 09/23/16  8:34 PM  Result Value Ref Range   Total CK 62 38 - 234 U/L      PHQ2/9: Depression screen [facility] 2/9 09/04/2016 08/14/2016 01/03/2016 07/12/2015  Decreased Interest 0 1 0 0  Down, Depressed, Hopeless 0 1 0 1  PHQ - 2 Score 0 2 0 1  Altered sleeping 0 1 - -  Tired, decreased energy 0 0 - -  Change in appetite 0 0 - -  Feeling bad or failure about yourself  0 0 - -  Trouble concentrating 0 0 - -  Moving slowly or fidgety/restless 0 0 - -  Suicidal thoughts 0 0 - -  PHQ-9 Score 0 3 - -  Difficult doing work/chores Not difficult at all Somewhat difficult - -     Fall Risk: Fall Risk  09/04/2016 08/14/2016 01/03/2016 07/12/2015  Falls in the past year? No No No No     Assessment & Plan  1. Pain in joint involving pelvic region and thigh, right  No trauma. Complex Regional Syndrome?  - MR FEMUR RIGHT W CONTRAST; Future - Ambulatory referral to Orthopedic Surgery - gabapentin (NEURONTIN) 100 MG  capsule; Take 1-3 capsules (100-300 mg total) by mouth at bedtime.  Dispense: 90 capsule; Refill: 0

## 2016-09-26 DIAGNOSIS — M5416 Radiculopathy, lumbar region: Secondary | ICD-10-CM | POA: Diagnosis not present

## 2016-09-27 ENCOUNTER — Telehealth: Payer: Self-pay

## 2016-09-27 NOTE — Telephone Encounter (Signed)
A message was left for this patient informing her that she has been scheduled for her MRI on 10/05/16 @ 1pm at [facility] ([facility])

## 2016-10-02 ENCOUNTER — Ambulatory Visit: Payer: BLUE CROSS/BLUE SHIELD | Admitting: Hematology and Oncology

## 2016-10-04 DIAGNOSIS — M5416 Radiculopathy, lumbar region: Secondary | ICD-10-CM | POA: Diagnosis not present

## 2016-10-04 NOTE — Addendum Note (Signed)
Addended by: Johnnette Litter A on: 10/04/2016 03:57 PM   Modules accepted: Orders

## 2016-10-05 ENCOUNTER — Ambulatory Visit: Payer: BLUE CROSS/BLUE SHIELD

## 2016-10-09 NOTE — Progress Notes (Deleted)
[place]  [phone] [phone]) [phone]  ID: [name] OB: Dec 20, 1970  MR#: [id number]  [id number]  Patient Care Team: [name], MD as PCP - General (Family Medicine)  CHIEF COMPLAINT: Lymphocytosis.  INTERVAL HISTORY: ***  REVIEW OF SYSTEMS:   ROS  As per HPI. Otherwise, a complete review of systems is negative.  PAST MEDICAL HISTORY: Past Medical History:  Diagnosis Date  . Anxiety   . Cervical intraepithelial neoplasia I   . Depression   . Eating disorder   . History of self-harm   . Insomnia   . Obsession   . Pap smear abnormality of cervix with LGSIL   . Tobacco abuse   . Vitamin B12 deficiency (non anemic)     PAST SURGICAL HISTORY: Past Surgical History:  Procedure Laterality Date  . CERVICAL BIOPSY  W/ LOOP ELECTRODE EXCISION    . TUBAL LIGATION      FAMILY HISTORY: Family History  Problem Relation Age of Onset  . Cancer Mother     thyroid  . Alcohol abuse Father   . Depression Sister   . Cancer Sister     cevical  . Alcohol abuse Brother   . Depression Brother   . Bipolar disorder Brother   . ADD / ADHD Son     ADVANCED DIRECTIVES (Y/N):  N  HEALTH MAINTENANCE: Social History  Substance Use Topics  . Smoking status: Current Every Day Smoker    Packs/day: 1.00    Years: 30.00    Start date: 09/25/1986  . Smokeless tobacco: Never Used  . Alcohol use 0.0 oz/week     Comment: rarely     Colonoscopy:  PAP:  Bone density:  Lipid panel:  No Known Allergies  Current Outpatient Prescriptions  Medication Sig Dispense Refill  . DULoxetine (CYMBALTA) 30 MG capsule Take 1 capsule (30 mg total) by mouth daily. Daily 60 capsule 2  . gabapentin (NEURONTIN) 100 MG capsule Take 1-3 capsules (100-300 mg total) by mouth at bedtime. 90 capsule 0  . HYDROcodone-acetaminophen (NORCO/VICODIN) 5-325 MG tablet Take 1 tablet by mouth every 4 (four) hours as needed for moderate pain. 20 tablet 0  .  ibuprofen (ADVIL,MOTRIN) 600 MG tablet Take 1 tablet (600 mg total) by mouth every 6 (six) hours as needed for moderate pain. 15 tablet 0   No current facility-administered medications for this visit.     OBJECTIVE: There were no vitals filed for this visit.   There is no height or weight on file to calculate BMI.    ECOG FS:{CHL ONC [id number]  General: Well-developed, well-nourished, no acute distress. Eyes: Pink conjunctiva, anicteric sclera. HEENT: Normocephalic, moist mucous membranes, clear oropharnyx. Lungs: Clear to auscultation bilaterally. Heart: Regular rate and rhythm. No rubs, murmurs, or gallops. Abdomen: Soft, nontender, nondistended. No organomegaly noted, normoactive bowel sounds. Musculoskeletal: No edema, cyanosis, or clubbing. Neuro: Alert, answering all questions appropriately. Cranial nerves grossly intact. Skin: No rashes or petechiae noted. Psych: Normal affect. Lymphatics: No cervical, calvicular, axillary or inguinal LAD.   LAB RESULTS:  Lab Results  Component Value Date   NA 138 09/23/2016   K 4.4 09/23/2016   CL 107 09/23/2016   CO2 26 09/23/2016   GLUCOSE 68 09/23/2016   BUN 18 09/23/2016   CREATININE 0.80 09/23/2016   CALCIUM 8.6 (L) 09/23/2016   PROT 6.6 09/23/2016   ALBUMIN 3.9 09/23/2016   AST 22 09/23/2016   ALT 16 09/23/2016   ALKPHOS 49 09/23/2016   BILITOT  0.5 09/23/2016   GFRNONAA >60 09/23/2016   GFRAA >60 09/23/2016    Lab Results  Component Value Date   WBC 25.0 (H) 09/23/2016   NEUTROABS 5.3 09/23/2016   HGB 13.6 09/23/2016   HCT 41.2 09/23/2016   MCV 97.9 09/23/2016   PLT 210 09/23/2016     STUDIES: Dg Chest 2 View  Result Date: 09/23/2016 CLINICAL DATA:  Subacute intermittent cough.  Initial encounter. EXAM: CHEST  2 VIEW COMPARISON:  None. FINDINGS: The lungs are well-aerated and clear. There is no evidence of focal opacification, pleural effusion or pneumothorax. A likely small calcified granuloma is noted at  the right midlung zone. The heart is normal in size; the mediastinal contour is within normal limits. No acute osseous abnormalities are seen. IMPRESSION: No acute cardiopulmonary process seen. Electronically Signed   By: Garald Balding M.D.   On: 09/23/2016 20:26   US Venous Img Lower Unilateral Right  Result Date: 09/23/2016 CLINICAL DATA:  Right leg pain for 1 day. EXAM: RIGHT LOWER EXTREMITY VENOUS DOPPLER ULTRASOUND TECHNIQUE: Gray-scale sonography with graded compression, as well as color Doppler and duplex ultrasound, were performed to evaluate the deep venous system from the level of the common femoral vein through the popliteal and proximal calf veins. Spectral Doppler was utilized to evaluate flow at rest and with distal augmentation maneuvers. COMPARISON:  06/29/2016 FINDINGS: Left common femoral vein is patent without thrombus. Normal compressibility, augmentation and color Doppler flow in the right common femoral vein, right femoral vein and right popliteal vein. The right saphenofemoral junction is patent. Right profunda femoral vein is patent without thrombus. Visualized right deep calf veins are patent without thrombus. IMPRESSION: Negative for deep venous thrombosis in right lower extremity. Electronically Signed   By: Markus Daft M.D.   On: 09/23/2016 18:21   Dg Femur Min 2 Views Right  Result Date: 09/23/2016 CLINICAL DATA:  Right thigh pain into the lower leg EXAM: RIGHT FEMUR 2 VIEWS COMPARISON:  None. FINDINGS: There is no evidence of fracture or other focal bone lesions. Soft tissues are unremarkable. IMPRESSION: Negative. Electronically Signed   By: Donavan Foil M.D.   On: 09/23/2016 21:12    ASSESSMENT: Lymphocytosis  PLAN:    1. Lymphocytosis:  Patient expressed understanding and was in agreement with this plan. She also understands that She can call clinic at any time with any questions, concerns, or complaints.   Cancer Staging No matching staging information was found  for the patient.  Lloyd Huger, MD   10/09/2016 11:26 PM

## 2016-10-10 ENCOUNTER — Inpatient Hospital Stay: Payer: BLUE CROSS/BLUE SHIELD | Admitting: Oncology

## 2016-10-17 ENCOUNTER — Telehealth: Payer: Self-pay

## 2016-10-17 ENCOUNTER — Other Ambulatory Visit: Payer: Self-pay | Admitting: Family Medicine

## 2016-10-17 ENCOUNTER — Other Ambulatory Visit: Payer: Self-pay | Admitting: Obstetrics and Gynecology

## 2016-10-17 ENCOUNTER — Encounter: Payer: Self-pay | Admitting: Obstetrics and Gynecology

## 2016-10-17 ENCOUNTER — Ambulatory Visit (INDEPENDENT_AMBULATORY_CARE_PROVIDER_SITE_OTHER): Payer: BLUE CROSS/BLUE SHIELD | Admitting: Obstetrics and Gynecology

## 2016-10-17 VITALS — BP 93/58 | HR 78 | Ht 67.0 in | Wt 151.9 lb

## 2016-10-17 DIAGNOSIS — Z87410 Personal history of cervical dysplasia: Secondary | ICD-10-CM

## 2016-10-17 DIAGNOSIS — M5416 Radiculopathy, lumbar region: Secondary | ICD-10-CM

## 2016-10-17 DIAGNOSIS — R87610 Atypical squamous cells of undetermined significance on cytologic smear of cervix (ASC-US): Secondary | ICD-10-CM

## 2016-10-17 NOTE — Telephone Encounter (Signed)
Order was cancelled.  Patient was informed that she was ok to not proceed with any additional testing.  Patient stated that she is finished with Ortho. They told her that she has Degenerative Disc and Arthritis and that there is nothing really that can be done.

## 2016-10-17 NOTE — Telephone Encounter (Signed)
Patient called wanting to make sure she did not need the MRI since it was confirmed via ortho that the pain was coming from her back.  Also patient had her cervical biopsy today and will get results in 5 days.

## 2016-10-17 NOTE — Progress Notes (Signed)
    GYNECOLOGY CLINIC COLPOSCOPY PROCEDURE NOTE  46 y.o. P2002 here for colposcopy for ASCUS with POSITIVE high risk HPV pap smear on 09/12/2016. Discussed role for HPV in cervical dysplasia, need for surveillance.  Patient given informed consent, signed copy in the chart, time out was performed.  Placed in lithotomy position. Cervix viewed with speculum and colposcope after application of acetic acid.   Colposcopy adequate? Yes Findings: no visible lesions, no mosaicism, no punctation and no abnormal vasculature; no biopsies obtained.  ECC specimen obtained. All specimens were labeled and sent to pathology.   Patient was given post procedure instructions.  Will follow up pathology and manage accordingly.  Routine preventative health maintenance measures emphasized.    Rubie Maid, MD [facility]

## 2016-10-17 NOTE — Telephone Encounter (Signed)
Ordered

## 2016-10-24 ENCOUNTER — Telehealth: Payer: Self-pay | Admitting: Obstetrics and Gynecology

## 2016-10-24 LAB — PATHOLOGY

## 2016-10-24 NOTE — Telephone Encounter (Signed)
Called pt informed her of negative biopsy per Dr.Cherry to repeat PAP in 1 yr.

## 2016-10-24 NOTE — Telephone Encounter (Signed)
Pt calling for colpo results  

## 2016-10-28 ENCOUNTER — Encounter: Payer: Self-pay | Admitting: Obstetrics and Gynecology

## 2016-10-28 NOTE — Progress Notes (Signed)
[place]  Telephone:(336) [phone] Fax:(336) [phone]  ID: Monique [name] OB: 27-Sep-1970  MR#: [id number]  NKN#:[id number]  Patient Care Team: [name], MD as PCP - General (Family Medicine)  CHIEF COMPLAINT: Lymphocytosis.  INTERVAL HISTORY: Patient is a 46 year old female who was found to have an elevated white blood cell count with lymphocyte predominance on routine blood work. Repeat bloodwork revealed persistent lymphocytosis. She is anxious, but otherwise feels well. She has no neurologic complaints. She denies any recent fevers, illnesses, or weight loss. She denies any night sweats. She has noted no lymphadenopathy. She has no chest pain or shortness of breath. She denies any nausea, vomiting, constipation, or diarrhea. She has no urinary complaints. Patient feels at her baseline and offers no further specific complaints today.  REVIEW OF SYSTEMS:   Review of Systems  Constitutional: Negative.  Negative for fever, malaise/fatigue and weight loss.  Respiratory: Negative.  Negative for cough and shortness of breath.   Cardiovascular: Negative.  Negative for chest pain and leg swelling.  Gastrointestinal: Negative.  Negative for abdominal pain.  Musculoskeletal: Negative.   Neurological: Negative.  Negative for sensory change and weakness.  Psychiatric/Behavioral: The patient is nervous/anxious.     As per HPI. Otherwise, a complete review of systems is negative.  PAST MEDICAL HISTORY: Past Medical History:  Diagnosis Date  . Anxiety   . Cervical cancer ([place])   . Cervical intraepithelial neoplasia I   . Depression   . Eating disorder   . History of self-harm   . Insomnia   . Obsession   . Pap smear abnormality of cervix with LGSIL   . Tobacco abuse   . Vitamin B12 deficiency (non anemic)     PAST SURGICAL HISTORY: Past Surgical History:  Procedure Laterality Date  . CERVICAL BIOPSY  W/ LOOP ELECTRODE EXCISION    . TUBAL LIGATION       FAMILY HISTORY: Family History  Problem Relation Age of Onset  . Cancer Mother     thyroid  . Alcohol abuse Father   . Depression Sister   . Cancer Sister     cevical  . Alcohol abuse Brother   . Depression Brother   . Bipolar disorder Brother   . ADD / ADHD Son     ADVANCED DIRECTIVES (Y/N):  N  HEALTH MAINTENANCE: Social History  Substance Use Topics  . Smoking status: Current Every Day Smoker    Packs/day: 1.00    Years: 30.00    Start date: 09/25/1986  . Smokeless tobacco: Never Used  . Alcohol use 0.0 oz/week     Comment: rarely     Colonoscopy:  PAP:  Bone density:  Lipid panel:  No Known Allergies  Current Outpatient Prescriptions  Medication Sig Dispense Refill  . DULoxetine (CYMBALTA) 30 MG capsule Take 1 capsule (30 mg total) by mouth daily. Daily 60 capsule 2  . ibuprofen (ADVIL,MOTRIN) 600 MG tablet Take 1 tablet (600 mg total) by mouth every 6 (six) hours as needed for moderate pain. 15 tablet 0  . traMADol (ULTRAM) 50 MG tablet Take 50 mg by mouth every 6 (six) hours.  1  . gabapentin (NEURONTIN) 100 MG capsule Take 1-3 capsules (100-300 mg total) by mouth at bedtime. (Patient not taking: Reported on 10/29/2016) 90 capsule 0  . HYDROcodone-acetaminophen (NORCO/VICODIN) 5-325 MG tablet Take 1 tablet by mouth every 4 (four) hours as needed for moderate pain. (Patient not taking: Reported on 10/29/2016) 20 tablet 0   No current  facility-administered medications for this visit.     OBJECTIVE: Vitals:   10/29/16 1106  BP: 103/64  Pulse: 76  Resp: 18  Temp: 97.8 F (36.6 C)     Body mass index is 23.95 kg/m.    ECOG FS:0 - Asymptomatic  General: Well-developed, well-nourished, no acute distress. Eyes: Pink conjunctiva, anicteric sclera. HEENT: Normocephalic, moist mucous membranes, clear oropharnyx. Lungs: Clear to auscultation bilaterally. Heart: Regular rate and rhythm. No rubs, murmurs, or gallops. Abdomen: Soft, nontender, nondistended. No  organomegaly noted, normoactive bowel sounds. Musculoskeletal: No edema, cyanosis, or clubbing. Neuro: Alert, answering all questions appropriately. Cranial nerves grossly intact. Skin: No rashes or petechiae noted. Psych: Normal affect. Lymphatics: No cervical, calvicular, axillary or inguinal LAD.   LAB RESULTS:  Lab Results  Component Value Date   NA 138 09/23/2016   K 4.4 09/23/2016   CL 107 09/23/2016   CO2 26 09/23/2016   GLUCOSE 68 09/23/2016   BUN 18 09/23/2016   CREATININE 0.80 09/23/2016   CALCIUM 8.6 (L) 09/23/2016   PROT 6.6 09/23/2016   ALBUMIN 3.9 09/23/2016   AST 22 09/23/2016   ALT 16 09/23/2016   ALKPHOS 49 09/23/2016   BILITOT 0.5 09/23/2016   GFRNONAA >60 09/23/2016   GFRAA >60 09/23/2016    Lab Results  Component Value Date   WBC 25.5 (H) 10/29/2016   NEUTROABS 5.0 10/29/2016   HGB 13.2 10/29/2016   HCT 39.1 10/29/2016   MCV 96.6 10/29/2016   PLT 210 10/29/2016     STUDIES: No results found.  ASSESSMENT: Lymphocytosis  PLAN:    1. Lymphocytosis: Given patient's persistently elevated white blood cell count with lymphocyte predominance, this is highly suspicious for CLL. Peripheral blood flow cytometry to confirm has been ordered and is pending at time of dictation. If confirmed, she is likely stage Rai stage 0. No further intervention is needed at this time. Patient does not require bone marrow biopsy. Return to clinic in 1 month with repeat laboratory work and further evaluation.  Approximately 35 minutes was spent in discussion of which greater than 50% was consultation.  Patient expressed understanding and was in agreement with this plan. She also understands that She can call clinic at any time with any questions, concerns, or complaints.   Cancer Staging No matching staging information was found for the patient.  Lloyd Huger, MD   11/01/2016 8:24 AM

## 2016-10-29 ENCOUNTER — Inpatient Hospital Stay: Payer: BLUE CROSS/BLUE SHIELD

## 2016-10-29 ENCOUNTER — Inpatient Hospital Stay: Payer: BLUE CROSS/BLUE SHIELD | Attending: Oncology | Admitting: Oncology

## 2016-10-29 ENCOUNTER — Encounter: Payer: Self-pay | Admitting: Oncology

## 2016-10-29 VITALS — BP 103/64 | HR 76 | Temp 97.8°F | Resp 18 | Ht 67.0 in | Wt 152.9 lb

## 2016-10-29 DIAGNOSIS — F17210 Nicotine dependence, cigarettes, uncomplicated: Secondary | ICD-10-CM | POA: Diagnosis not present

## 2016-10-29 DIAGNOSIS — G4700 Insomnia, unspecified: Secondary | ICD-10-CM | POA: Insufficient documentation

## 2016-10-29 DIAGNOSIS — D72820 Lymphocytosis (symptomatic): Secondary | ICD-10-CM | POA: Insufficient documentation

## 2016-10-29 DIAGNOSIS — F419 Anxiety disorder, unspecified: Secondary | ICD-10-CM | POA: Diagnosis not present

## 2016-10-29 DIAGNOSIS — F329 Major depressive disorder, single episode, unspecified: Secondary | ICD-10-CM | POA: Diagnosis not present

## 2016-10-29 DIAGNOSIS — Z8541 Personal history of malignant neoplasm of cervix uteri: Secondary | ICD-10-CM | POA: Insufficient documentation

## 2016-10-29 LAB — CBC WITH DIFFERENTIAL/PLATELET
Basophils Absolute: 0.1 10*3/uL (ref 0–0.1)
Basophils Relative: 1 %
EOS ABS: 0.1 10*3/uL (ref 0–0.7)
EOS PCT: 0 %
HCT: 39.1 % (ref 35.0–47.0)
Hemoglobin: 13.2 g/dL (ref 12.0–16.0)
LYMPHS ABS: 19.3 10*3/uL — AB (ref 1.0–3.6)
LYMPHS PCT: 75 %
MCH: 32.7 pg (ref 26.0–34.0)
MCHC: 33.8 g/dL (ref 32.0–36.0)
MCV: 96.6 fL (ref 80.0–100.0)
MONOS PCT: 4 %
Monocytes Absolute: 1 10*3/uL — ABNORMAL HIGH (ref 0.2–0.9)
Neutro Abs: 5 10*3/uL (ref 1.4–6.5)
Neutrophils Relative %: 20 %
PLATELETS: 210 10*3/uL (ref 150–440)
RBC: 4.04 MIL/uL (ref 3.80–5.20)
RDW: 13.2 % (ref 11.5–14.5)
WBC: 25.5 10*3/uL — AB (ref 3.6–11.0)

## 2016-10-29 LAB — LACTATE DEHYDROGENASE: LDH: 191 U/L (ref 98–192)

## 2016-11-02 LAB — COMP PANEL: LEUKEMIA/LYMPHOMA: PATH INTERP XXX-IMP: NEGATIVE

## 2016-11-12 ENCOUNTER — Telehealth: Payer: Self-pay | Admitting: *Deleted

## 2016-11-12 NOTE — Telephone Encounter (Signed)
Pt is aware of her diagnosis of CLL per [name]. Informed pt to keep appts as scheduled and [name] will follow up with her on 3/13. Pt verbalized understanding.

## 2016-11-24 DIAGNOSIS — C9110 Chronic lymphocytic leukemia of B-cell type not having achieved remission: Secondary | ICD-10-CM | POA: Insufficient documentation

## 2016-11-24 NOTE — Progress Notes (Signed)
[place]  Telephone:(336) [phone] Fax:(336) [phone]  ID: Monique [name] OB: [birth year]  MR#: [id number]  CZY#:[id number]  Patient Care Team: [name], MD as PCP - General (Family Medicine)  CHIEF COMPLAINT: CLL.  INTERVAL HISTORY: Patient returns to clinic today for repeat laboratory work and further evaluation. She is anxious, but otherwise feels well. She has no neurologic complaints. She denies any recent fevers, illnesses, or weight loss. She denies any night sweats. She has noted no lymphadenopathy. She has no chest pain or shortness of breath. She denies any nausea, vomiting, constipation, or diarrhea. She has no urinary complaints. Patient offers no further specific complaints today.  REVIEW OF SYSTEMS:   Review of Systems  Constitutional: Negative.  Negative for fever, malaise/fatigue and weight loss.  Respiratory: Negative.  Negative for cough and shortness of breath.   Cardiovascular: Negative.  Negative for chest pain and leg swelling.  Gastrointestinal: Negative.  Negative for abdominal pain.  Musculoskeletal: Negative.   Neurological: Negative.  Negative for sensory change and weakness.  Psychiatric/Behavioral: The patient is nervous/anxious.     As per HPI. Otherwise, a complete review of systems is negative.  PAST MEDICAL HISTORY: Past Medical History:  Diagnosis Date  . Anxiety   . Cervical cancer ([place])   . Cervical intraepithelial neoplasia I   . Depression   . Eating disorder   . History of self-harm   . Insomnia   . Obsession   . Pap smear abnormality of cervix with LGSIL   . Tobacco abuse   . Vitamin B12 deficiency (non anemic)     PAST SURGICAL HISTORY: Past Surgical History:  Procedure Laterality Date  . CERVICAL BIOPSY  W/ LOOP ELECTRODE EXCISION    . TUBAL LIGATION      FAMILY HISTORY: Family History  Problem Relation Age of Onset  . Cancer Mother     thyroid  . Alcohol abuse Father   . Depression Sister     . Cancer Sister     cevical  . Alcohol abuse Brother   . Depression Brother   . Bipolar disorder Brother   . ADD / ADHD Son     ADVANCED DIRECTIVES (Y/N):  N  HEALTH MAINTENANCE: Social History  Substance Use Topics  . Smoking status: Current Every Day Smoker    Packs/day: 1.00    Years: 30.00    Start date: 09/25/1986  . Smokeless tobacco: Never Used  . Alcohol use 0.0 oz/week     Comment: rarely     Colonoscopy:  PAP:  Bone density:  Lipid panel:  No Known Allergies  Current Outpatient Prescriptions  Medication Sig Dispense Refill  . ibuprofen (ADVIL,MOTRIN) 600 MG tablet Take 1 tablet (600 mg total) by mouth every 6 (six) hours as needed for moderate pain. 15 tablet 0  . traMADol (ULTRAM) 50 MG tablet Take 50 mg by mouth every 6 (six) hours.  1  . DULoxetine (CYMBALTA) 30 MG capsule Take 1 capsule (30 mg total) by mouth daily. Daily (Patient not taking: Reported on 11/26/2016) 60 capsule 2  . gabapentin (NEURONTIN) 100 MG capsule Take 1-3 capsules (100-300 mg total) by mouth at bedtime. (Patient not taking: Reported on 10/29/2016) 90 capsule 0  . HYDROcodone-acetaminophen (NORCO/VICODIN) 5-325 MG tablet Take 1 tablet by mouth every 4 (four) hours as needed for moderate pain. (Patient not taking: Reported on 10/29/2016) 20 tablet 0   No current facility-administered medications for this visit.     OBJECTIVE: Vitals:   11/26/16  1136  BP: 109/73  Pulse: 74  Resp: 18  Temp: 98.5 F (36.9 C)     Body mass index is 24.07 kg/m.    ECOG FS:0 - Asymptomatic  General: Well-developed, well-nourished, no acute distress. Eyes: Pink conjunctiva, anicteric sclera. Lungs: Clear to auscultation bilaterally. Heart: Regular rate and rhythm. No rubs, murmurs, or gallops. Abdomen: Soft, nontender, nondistended. No organomegaly noted, normoactive bowel sounds. Musculoskeletal: No edema, cyanosis, or clubbing. Neuro: Alert, answering all questions appropriately. Cranial nerves  grossly intact. Skin: No rashes or petechiae noted. Psych: Normal affect. Lymphatics: No cervical, calvicular, axillary or inguinal LAD.   LAB RESULTS:  Lab Results  Component Value Date   NA 138 09/23/2016   K 4.4 09/23/2016   CL 107 09/23/2016   CO2 26 09/23/2016   GLUCOSE 68 09/23/2016   BUN 18 09/23/2016   CREATININE 0.80 09/23/2016   CALCIUM 8.6 (L) 09/23/2016   PROT 6.6 09/23/2016   ALBUMIN 3.9 09/23/2016   AST 22 09/23/2016   ALT 16 09/23/2016   ALKPHOS 49 09/23/2016   BILITOT 0.5 09/23/2016   GFRNONAA >60 09/23/2016   GFRAA >60 09/23/2016    Lab Results  Component Value Date   WBC 32.8 (H) 11/26/2016   NEUTROABS 5.2 11/26/2016   HGB 13.1 11/26/2016   HCT 39.2 11/26/2016   MCV 96.6 11/26/2016   PLT 225 11/26/2016     STUDIES: No results found.  ASSESSMENT: CLL, Rai stage 0  PLAN:    1. CLL: Confirmed by peripheral blood flow cytometry. No intervention is needed at this time. Patient does not require bone marrow biopsy. Can consider imaging with CT scans in the future, but this is not necessary at this point. Return to clinic in 6 weeks for laboratory only and then in 3 months for laboratory work and further evaluation.   Approximately 30 minutes was spent in discussion of which greater than 50% was consultation.  Patient expressed understanding and was in agreement with this plan. She also understands that She can call clinic at any time with any questions, concerns, or complaints.    Lloyd Huger, MD   11/29/2016 8:40 AM

## 2016-11-26 ENCOUNTER — Inpatient Hospital Stay: Payer: BLUE CROSS/BLUE SHIELD

## 2016-11-26 ENCOUNTER — Inpatient Hospital Stay: Payer: BLUE CROSS/BLUE SHIELD | Attending: Oncology | Admitting: Oncology

## 2016-11-26 VITALS — BP 109/73 | HR 74 | Temp 98.5°F | Resp 18 | Wt 153.7 lb

## 2016-11-26 DIAGNOSIS — C9110 Chronic lymphocytic leukemia of B-cell type not having achieved remission: Secondary | ICD-10-CM | POA: Diagnosis not present

## 2016-11-26 DIAGNOSIS — F17210 Nicotine dependence, cigarettes, uncomplicated: Secondary | ICD-10-CM | POA: Diagnosis not present

## 2016-11-26 DIAGNOSIS — Z915 Personal history of self-harm: Secondary | ICD-10-CM | POA: Diagnosis not present

## 2016-11-26 DIAGNOSIS — E538 Deficiency of other specified B group vitamins: Secondary | ICD-10-CM | POA: Diagnosis not present

## 2016-11-26 DIAGNOSIS — F509 Eating disorder, unspecified: Secondary | ICD-10-CM | POA: Diagnosis not present

## 2016-11-26 DIAGNOSIS — Z79899 Other long term (current) drug therapy: Secondary | ICD-10-CM | POA: Diagnosis not present

## 2016-11-26 DIAGNOSIS — G4700 Insomnia, unspecified: Secondary | ICD-10-CM | POA: Diagnosis not present

## 2016-11-26 DIAGNOSIS — Z808 Family history of malignant neoplasm of other organs or systems: Secondary | ICD-10-CM | POA: Insufficient documentation

## 2016-11-26 DIAGNOSIS — F329 Major depressive disorder, single episode, unspecified: Secondary | ICD-10-CM | POA: Diagnosis not present

## 2016-11-26 DIAGNOSIS — F419 Anxiety disorder, unspecified: Secondary | ICD-10-CM | POA: Diagnosis not present

## 2016-11-26 DIAGNOSIS — Z8541 Personal history of malignant neoplasm of cervix uteri: Secondary | ICD-10-CM

## 2016-11-26 DIAGNOSIS — D72820 Lymphocytosis (symptomatic): Secondary | ICD-10-CM

## 2016-11-26 LAB — CBC WITH DIFFERENTIAL/PLATELET
Basophils Absolute: 0.1 10*3/uL (ref 0–0.1)
Basophils Relative: 0 %
EOS PCT: 0 %
Eosinophils Absolute: 0.1 10*3/uL (ref 0–0.7)
HEMATOCRIT: 39.2 % (ref 35.0–47.0)
Hemoglobin: 13.1 g/dL (ref 12.0–16.0)
LYMPHS PCT: 81 %
Lymphs Abs: 26.6 10*3/uL — ABNORMAL HIGH (ref 1.0–3.6)
MCH: 32.4 pg (ref 26.0–34.0)
MCHC: 33.5 g/dL (ref 32.0–36.0)
MCV: 96.6 fL (ref 80.0–100.0)
MONO ABS: 0.8 10*3/uL (ref 0.2–0.9)
Monocytes Relative: 3 %
Neutro Abs: 5.2 10*3/uL (ref 1.4–6.5)
Neutrophils Relative %: 16 %
PLATELETS: 225 10*3/uL (ref 150–440)
RBC: 4.06 MIL/uL (ref 3.80–5.20)
RDW: 13.1 % (ref 11.5–14.5)
WBC: 32.8 10*3/uL — AB (ref 3.6–11.0)

## 2016-11-26 NOTE — Progress Notes (Signed)
Patient is here for follow up  

## 2016-12-11 ENCOUNTER — Ambulatory Visit (INDEPENDENT_AMBULATORY_CARE_PROVIDER_SITE_OTHER): Payer: BLUE CROSS/BLUE SHIELD | Admitting: Family Medicine

## 2016-12-11 ENCOUNTER — Encounter: Payer: Self-pay | Admitting: Family Medicine

## 2016-12-11 VITALS — BP 118/72 | HR 97 | Temp 98.4°F | Resp 16 | Wt 150.1 lb

## 2016-12-11 DIAGNOSIS — K219 Gastro-esophageal reflux disease without esophagitis: Secondary | ICD-10-CM | POA: Diagnosis not present

## 2016-12-11 DIAGNOSIS — F330 Major depressive disorder, recurrent, mild: Secondary | ICD-10-CM | POA: Diagnosis not present

## 2016-12-11 DIAGNOSIS — R590 Localized enlarged lymph nodes: Secondary | ICD-10-CM

## 2016-12-11 DIAGNOSIS — C9110 Chronic lymphocytic leukemia of B-cell type not having achieved remission: Secondary | ICD-10-CM

## 2016-12-11 DIAGNOSIS — R490 Dysphonia: Secondary | ICD-10-CM

## 2016-12-11 DIAGNOSIS — F411 Generalized anxiety disorder: Secondary | ICD-10-CM | POA: Diagnosis not present

## 2016-12-11 DIAGNOSIS — F5001 Anorexia nervosa, restricting type: Secondary | ICD-10-CM | POA: Diagnosis not present

## 2016-12-11 DIAGNOSIS — R87618 Other abnormal cytological findings on specimens from cervix uteri: Secondary | ICD-10-CM

## 2016-12-11 DIAGNOSIS — R8789 Other abnormal findings in specimens from female genital organs: Secondary | ICD-10-CM

## 2016-12-11 DIAGNOSIS — E538 Deficiency of other specified B group vitamins: Secondary | ICD-10-CM

## 2016-12-11 NOTE — Progress Notes (Signed)
Name: Monique [name]   MRN: [id number]    DOB: 28-Dec-1970   Date:12/11/2016       Progress Note  Subjective  Chief Complaint  Chief Complaint  Patient presents with  . Follow-up    3 month     HPI  Major Depression/GAD/Anorexia: she has stopped taking Fluvoxamine months ago. She still has  having crying spells, she states when very nervous she is unable to talk - makes her very hoarse. She has anhedonia. She is worried about her daughter and 3 grandchildren moving back home, also raising a 69 yo son, stressed financially ( currently providing for two grown daughters and 3 grandchildren)  She denies suicidal thoughts or ideation . She stopped seeing therapist because of the chaos in her life right now. Explained it is important to go back. She stopped Cymbalta on her own, does not want to take medication. Still has a lot of stress at home, but coping better.   CLL: WBC very high, seen by [name] and is okay with the diagnosis. Currently not on medication, only monitoring.   Abnormal pap smear: had colposcopy by GYN ( [facility] - Dr Marcelline Mates ) , last pap was ASCUS and positive HPV, advised yearly visit  B12 deficiency: needs to resume supplementation   GERD: she woke up two days ago with hoarseness, she has a history of GERD and the night before she ate a burrito with some spices. Explained hoarseness may have been from GERD. Advised to take Ranitidine prn at night. Change life style . Seen by ENT in the past for hoarseness.   Lymphadenopathy: she has noticed left posterior nodules a few months ago, not growing or painful, but she has CLL and we will send her for biopsy.   Anorexia: she states that it has been under control, last episode was before last Christmas, she states it happens when she feels like she gains weight and clothe is too tight. She only eats one meal daily when restricting meals. Usually episodes lasts one week or two.    Patient Active Problem List   Diagnosis  Date Noted  . GERD without esophagitis 12/11/2016  . CLL (chronic lymphocytic leukemia) ([place]) 11/24/2016  . Pap smear abnormality of cervix/human papillomavirus (HPV) positive 09/12/2016  . Stress incontinence 09/04/2016  . B12 deficiency 07/10/2015  . Insomnia, persistent 07/10/2015  . Mild episode of recurrent major depressive disorder ([place]) 07/10/2015  . Anorexia nervosa, restricting type 07/10/2015  . Anxiety, generalized 07/10/2015  . H/O suicide attempt 07/10/2015  . Lymphocytosis 07/10/2015  . Obsessive-compulsive disorder 07/10/2015  . Tobacco use 07/10/2015  . History of cervical dysplasia 07/18/2014    Past Surgical History:  Procedure Laterality Date  . CERVICAL BIOPSY  W/ LOOP ELECTRODE EXCISION    . TUBAL LIGATION      Family History  Problem Relation Age of Onset  . Cancer Mother     thyroid  . Alcohol abuse Father   . Depression Sister   . Cancer Sister     cevical  . Alcohol abuse Brother   . Depression Brother   . Bipolar disorder Brother   . ADD / ADHD Son     Social History   Social History  . Marital status: Divorced    Spouse name: N/A  . Number of children: N/A  . Years of education: N/A   Occupational History  . Not on file.   Social History Main Topics  . Smoking status: Current Every Day Smoker  Packs/day: 1.00    Years: 30.00    Start date: 09/25/1986  . Smokeless tobacco: Never Used  . Alcohol use 0.0 oz/week     Comment: rarely  . Drug use: No  . Sexual activity: Yes    Partners: Male    Birth control/ protection: None   Other Topics Concern  . Not on file   Social History Narrative  . No narrative on file     Current Outpatient Prescriptions:  .  ibuprofen (ADVIL,MOTRIN) 600 MG tablet, Take 1 tablet (600 mg total) by mouth every 6 (six) hours as needed for moderate pain., Disp: 15 tablet, Rfl: 0 .  traMADol (ULTRAM) 50 MG tablet, Take 50 mg by mouth every 6 (six) hours as needed. , Disp: , Rfl: 1  No Known  Allergies   ROS  Constitutional: Negative for fever or significant weight change.  Respiratory: Negative for cough and shortness of breath.   Cardiovascular: Negative for chest pain or palpitations.  Gastrointestinal: Negative for abdominal pain, no bowel changes.  Musculoskeletal: Negative for gait problem or joint swelling.  Skin: Negative for rash.  Neurological: Negative for dizziness or headache.  No other specific complaints in a complete review of systems (except as listed in HPI above).  Objective  Vitals:   12/11/16 1318  BP: 118/72  Pulse: 97  Resp: 16  Temp: 98.4 F (36.9 C)  TempSrc: Oral  SpO2: 96%  Weight: 150 lb 1.6 oz (68.1 kg)    Body mass index is 23.51 kg/m.  Physical Exam  Constitutional: Patient appears well-developed and well-nourished.  No distress.  HEENT: head atraumatic, normocephalic, pupils equal and reactive to light,  neck supple, throat within normal limits, hoarseness , left posterior lymphadenopathy , non tender, rolls under skin. Cardiovascular: Normal rate, regular rhythm and normal heart sounds.  No murmur heard. No BLE edema. Pulmonary/Chest: Effort normal and breath sounds normal. No respiratory distress. Abdominal: Soft.  There is no tenderness. Psychiatric: Patient has a normal mood and affect. behavior is normal. Judgment and thought content normal.   PHQ2/9: Depression screen [facility] 2/9 12/11/2016 09/04/2016 08/14/2016 01/03/2016 07/12/2015  Decreased Interest 0 0 1 0 0  Down, Depressed, Hopeless 1 0 1 0 1  PHQ - 2 Score 1 0 2 0 1  Altered sleeping - 0 1 - -  Tired, decreased energy - 0 0 - -  Change in appetite - 0 0 - -  Feeling bad or failure about yourself  - 0 0 - -  Trouble concentrating - 0 0 - -  Moving slowly or fidgety/restless - 0 0 - -  Suicidal thoughts - 0 0 - -  PHQ-9 Score - 0 3 - -  Difficult doing work/chores - Not difficult at all Somewhat difficult - -    Fall Risk: Fall Risk  12/11/2016 09/04/2016  08/14/2016 01/03/2016 07/12/2015  Falls in the past year? No No No No No    Functional Status Survey: Is the patient deaf or have difficulty hearing?: No Does the patient have difficulty seeing, even when wearing glasses/contacts?: Yes Does the patient have difficulty concentrating, remembering, or making decisions?: No Does the patient have difficulty walking or climbing stairs?: No Does the patient have difficulty dressing or bathing?: No Does the patient have difficulty doing errands alone such as visiting a doctor's office or shopping?: No    Assessment & Plan  1. Mild episode of recurrent major depressive disorder ([place])  She stopped medication on her own, she  does not like taking medication  2. Anxiety, generalized  She states she is coping better with stress, but very stressed, she has not been getting counseling.   3. B12 deficiency  Discussed supplementation   4. Anorexia nervosa, restricting type  Stable at this time.   5. CLL (chronic lymphocytic leukemia) ([facility])  Seeing [name]  6. Pap smear abnormality of cervix/human papillomavirus (HPV) positive  She is on yearly follow up  7. GERD without esophagitis  She has episode of hoarseness usually in am, discussed importance of life style modification, can take prn modification  8. Lymphadenopathy of left cervical region  - Ambulatory referral to ENT  9. Hoarseness  - Ambulatory referral to ENT

## 2017-01-03 DIAGNOSIS — K219 Gastro-esophageal reflux disease without esophagitis: Secondary | ICD-10-CM | POA: Diagnosis not present

## 2017-01-03 DIAGNOSIS — R599 Enlarged lymph nodes, unspecified: Secondary | ICD-10-CM | POA: Diagnosis not present

## 2017-01-07 ENCOUNTER — Inpatient Hospital Stay: Payer: BLUE CROSS/BLUE SHIELD | Attending: Oncology

## 2017-01-07 ENCOUNTER — Ambulatory Visit: Payer: BLUE CROSS/BLUE SHIELD | Admitting: Oncology

## 2017-01-07 ENCOUNTER — Inpatient Hospital Stay: Payer: BLUE CROSS/BLUE SHIELD

## 2017-01-07 DIAGNOSIS — C9110 Chronic lymphocytic leukemia of B-cell type not having achieved remission: Secondary | ICD-10-CM | POA: Insufficient documentation

## 2017-01-07 LAB — CBC WITH DIFFERENTIAL/PLATELET
Basophils Absolute: 0.1 10*3/uL (ref 0–0.1)
Basophils Relative: 0 %
EOS PCT: 0 %
Eosinophils Absolute: 0.1 10*3/uL (ref 0–0.7)
HEMATOCRIT: 41.2 % (ref 35.0–47.0)
Hemoglobin: 13.9 g/dL (ref 12.0–16.0)
LYMPHS ABS: 31.3 10*3/uL — AB (ref 1.0–3.6)
Lymphocytes Relative: 89 %
MCH: 32.6 pg (ref 26.0–34.0)
MCHC: 33.8 g/dL (ref 32.0–36.0)
MCV: 96.2 fL (ref 80.0–100.0)
Monocytes Absolute: 0.7 10*3/uL (ref 0.2–0.9)
Monocytes Relative: 2 %
NEUTROS ABS: 3.3 10*3/uL (ref 1.4–6.5)
Neutrophils Relative %: 9 %
PLATELETS: 183 10*3/uL (ref 150–440)
RBC: 4.28 MIL/uL (ref 3.80–5.20)
RDW: 12.9 % (ref 11.5–14.5)
WBC: 35.6 10*3/uL — AB (ref 3.6–11.0)

## 2017-01-13 ENCOUNTER — Ambulatory Visit: Payer: BLUE CROSS/BLUE SHIELD | Admitting: Oncology

## 2017-01-13 NOTE — Progress Notes (Signed)
[place]  Telephone:(336) [phone] Fax:(336) [phone]  ID: [name] OB: Jan 10, 1971  MR#: [id number]  MWU#:[id number]  Patient Care Team: [name], MD as PCP - General (Family Medicine)  CHIEF COMPLAINT: CLL.  INTERVAL HISTORY: Patient returns to clinic today as an add-on for further evaluation. Patient's primary care noted "shotty" lymph nodes in her neck and referred her to ENT for further evaluation and biopsy. Because her potential lymphadenopathy may be related to her underlying CLL, ENT was instructed not to biopsy at this time. Patient continues to be highly anxious, but otherwise feels well. She has no neurologic complaints. She denies any recent fevers, illnesses, or weight loss. She denies any night sweats. She has noted no lymphadenopathy. She has no chest pain or shortness of breath. She denies any nausea, vomiting, constipation, or diarrhea. She has no urinary complaints. Patient offers no further specific complaints today.  REVIEW OF SYSTEMS:   Review of Systems  Constitutional: Negative.  Negative for fever, malaise/fatigue and weight loss.  Respiratory: Negative.  Negative for cough and shortness of breath.   Cardiovascular: Negative.  Negative for chest pain and leg swelling.  Gastrointestinal: Negative.  Negative for abdominal pain.  Musculoskeletal: Negative.   Skin: Negative.  Negative for rash.  Neurological: Negative.  Negative for sensory change and weakness.  Psychiatric/Behavioral: The patient is nervous/anxious.     As per HPI. Otherwise, a complete review of systems is negative.  PAST MEDICAL HISTORY: Past Medical History:  Diagnosis Date  . Anxiety   . Cervical cancer ([place])   . Cervical intraepithelial neoplasia I   . Depression   . Eating disorder   . History of self-harm   . Insomnia   . Obsession   . Pap smear abnormality of cervix with LGSIL   . Tobacco abuse   . Vitamin B12 deficiency (non anemic)     PAST  SURGICAL HISTORY: Past Surgical History:  Procedure Laterality Date  . CERVICAL BIOPSY  W/ LOOP ELECTRODE EXCISION    . TUBAL LIGATION      FAMILY HISTORY: Family History  Problem Relation Age of Onset  . Cancer Mother     thyroid  . Alcohol abuse Father   . Depression Sister   . Cancer Sister     cevical  . Alcohol abuse Brother   . Depression Brother   . Bipolar disorder Brother   . ADD / ADHD Son     ADVANCED DIRECTIVES (Y/N):  N  HEALTH MAINTENANCE: Social History  Substance Use Topics  . Smoking status: Current Every Day Smoker    Packs/day: 1.00    Years: 30.00    Start date: 09/25/1986  . Smokeless tobacco: Never Used  . Alcohol use 0.0 oz/week     Comment: rarely     Colonoscopy:  PAP:  Bone density:  Lipid panel:  No Known Allergies  Current Outpatient Prescriptions  Medication Sig Dispense Refill  . ibuprofen (ADVIL,MOTRIN) 600 MG tablet Take 1 tablet (600 mg total) by mouth every 6 (six) hours as needed for moderate pain. 15 tablet 0  . meloxicam (MOBIC) 15 MG tablet     . omeprazole (PRILOSEC) 40 MG capsule     . traMADol (ULTRAM) 50 MG tablet Take 50 mg by mouth every 6 (six) hours as needed.   1   No current facility-administered medications for this visit.     OBJECTIVE: Vitals:   01/15/17 1029  BP: 119/73  Pulse: 79  Temp: 97.6 F (  36.4 C)     Body mass index is 23.43 kg/m.    ECOG FS:0 - Asymptomatic  General: Well-developed, well-nourished, no acute distress. Eyes: Pink conjunctiva, anicteric sclera. Lungs: Clear to auscultation bilaterally. Heart: Regular rate and rhythm. No rubs, murmurs, or gallops. Abdomen: Soft, nontender, nondistended. No organomegaly noted, normoactive bowel sounds. Musculoskeletal: No edema, cyanosis, or clubbing. Neuro: Alert, answering all questions appropriately. Cranial nerves grossly intact. Skin: No rashes or petechiae noted. Psych: Normal affect. Lymphatics: Minimally palpable bilateral cervical  lymphadenopathy. No other lymphadenopathy palpated.  LAB RESULTS:  Lab Results  Component Value Date   NA 138 09/23/2016   K 4.4 09/23/2016   CL 107 09/23/2016   CO2 26 09/23/2016   GLUCOSE 68 09/23/2016   BUN 18 09/23/2016   CREATININE 0.80 09/23/2016   CALCIUM 8.6 (L) 09/23/2016   PROT 6.6 09/23/2016   ALBUMIN 3.9 09/23/2016   AST 22 09/23/2016   ALT 16 09/23/2016   ALKPHOS 49 09/23/2016   BILITOT 0.5 09/23/2016   GFRNONAA >60 09/23/2016   GFRAA >60 09/23/2016    Lab Results  Component Value Date   WBC 35.6 (H) 01/07/2017   NEUTROABS 3.3 01/07/2017   HGB 13.9 01/07/2017   HCT 41.2 01/07/2017   MCV 96.2 01/07/2017   PLT 183 01/07/2017     STUDIES: No results found.  ASSESSMENT: CLL, Rai stage 0  PLAN:    1. CLL: Confirmed by peripheral blood flow cytometry. Patient's white blood count is essentially unchanged from one month ago. Patient does not require bone marrow biopsy. Because of her lymphadenopathy, have ordered a CT of the neck, chest, abdomen, and pelvis for further evaluation. Will hold on biopsy at this time. Return to clinic in 1 week to discuss her imaging results.   Approximately 30 minutes was spent in discussion of which greater than 50% was consultation.  Patient expressed understanding and was in agreement with this plan. She also understands that She can call clinic at any time with any questions, concerns, or complaints.    Lloyd Huger, MD   01/17/2017 10:53 AM

## 2017-01-15 ENCOUNTER — Ambulatory Visit: Payer: BLUE CROSS/BLUE SHIELD | Admitting: Oncology

## 2017-01-15 ENCOUNTER — Inpatient Hospital Stay: Payer: BLUE CROSS/BLUE SHIELD | Attending: Oncology | Admitting: Oncology

## 2017-01-15 VITALS — BP 119/73 | HR 79 | Temp 97.6°F | Ht 67.0 in | Wt 149.6 lb

## 2017-01-15 DIAGNOSIS — G4700 Insomnia, unspecified: Secondary | ICD-10-CM | POA: Diagnosis not present

## 2017-01-15 DIAGNOSIS — Z79899 Other long term (current) drug therapy: Secondary | ICD-10-CM

## 2017-01-15 DIAGNOSIS — N854 Malposition of uterus: Secondary | ICD-10-CM | POA: Insufficient documentation

## 2017-01-15 DIAGNOSIS — Z8541 Personal history of malignant neoplasm of cervix uteri: Secondary | ICD-10-CM | POA: Insufficient documentation

## 2017-01-15 DIAGNOSIS — R590 Localized enlarged lymph nodes: Secondary | ICD-10-CM | POA: Insufficient documentation

## 2017-01-15 DIAGNOSIS — E538 Deficiency of other specified B group vitamins: Secondary | ICD-10-CM | POA: Diagnosis not present

## 2017-01-15 DIAGNOSIS — D3502 Benign neoplasm of left adrenal gland: Secondary | ICD-10-CM | POA: Insufficient documentation

## 2017-01-15 DIAGNOSIS — Z915 Personal history of self-harm: Secondary | ICD-10-CM | POA: Diagnosis not present

## 2017-01-15 DIAGNOSIS — F17210 Nicotine dependence, cigarettes, uncomplicated: Secondary | ICD-10-CM | POA: Insufficient documentation

## 2017-01-15 DIAGNOSIS — Z5112 Encounter for antineoplastic immunotherapy: Secondary | ICD-10-CM | POA: Insufficient documentation

## 2017-01-15 DIAGNOSIS — C9110 Chronic lymphocytic leukemia of B-cell type not having achieved remission: Secondary | ICD-10-CM | POA: Diagnosis not present

## 2017-01-15 DIAGNOSIS — F329 Major depressive disorder, single episode, unspecified: Secondary | ICD-10-CM | POA: Diagnosis not present

## 2017-01-15 DIAGNOSIS — R591 Generalized enlarged lymph nodes: Secondary | ICD-10-CM | POA: Insufficient documentation

## 2017-01-15 DIAGNOSIS — F419 Anxiety disorder, unspecified: Secondary | ICD-10-CM | POA: Insufficient documentation

## 2017-01-15 DIAGNOSIS — J439 Emphysema, unspecified: Secondary | ICD-10-CM | POA: Insufficient documentation

## 2017-01-15 DIAGNOSIS — I700 Atherosclerosis of aorta: Secondary | ICD-10-CM | POA: Diagnosis not present

## 2017-01-15 NOTE — Progress Notes (Signed)
Patient here for consultation. She has seen the ENT since her last appt.

## 2017-01-21 ENCOUNTER — Ambulatory Visit
Admission: RE | Admit: 2017-01-21 | Discharge: 2017-01-21 | Disposition: A | Discharge status: Home / Self Care | Payer: BLUE CROSS/BLUE SHIELD | Source: Ambulatory Visit | Attending: Oncology | Admitting: Oncology

## 2017-01-21 DIAGNOSIS — I700 Atherosclerosis of aorta: Secondary | ICD-10-CM | POA: Insufficient documentation

## 2017-01-21 DIAGNOSIS — R599 Enlarged lymph nodes, unspecified: Secondary | ICD-10-CM | POA: Insufficient documentation

## 2017-01-21 DIAGNOSIS — C9190 Lymphoid leukemia, unspecified not having achieved remission: Secondary | ICD-10-CM | POA: Insufficient documentation

## 2017-01-21 DIAGNOSIS — J439 Emphysema, unspecified: Secondary | ICD-10-CM | POA: Insufficient documentation

## 2017-01-21 DIAGNOSIS — C9110 Chronic lymphocytic leukemia of B-cell type not having achieved remission: Secondary | ICD-10-CM | POA: Diagnosis not present

## 2017-01-21 DIAGNOSIS — D3502 Benign neoplasm of left adrenal gland: Secondary | ICD-10-CM | POA: Insufficient documentation

## 2017-01-21 MED ORDER — IOPAMIDOL (ISOVUE-300) INJECTION 61%
100.0000 mL | Freq: Once | INTRAVENOUS | Status: AC | PRN
  Administered 2017-01-21: 100 mL via INTRAVENOUS

## 2017-01-21 NOTE — Progress Notes (Signed)
[place]  Telephone:(336) [phone] Fax:(336) [phone]  ID: Monique [name] OB: Aug 10, 1971  MR#: [id number]  YSA#:[id number]  Patient Care Team: [name], MD as PCP - General (Family Medicine)  CHIEF COMPLAINT: CLL.  INTERVAL HISTORY: Patient returns to clinic today for further evaluation, discussion of her imaging results, and treatment planning. She continues to be highly anxious, but otherwise feels well. She has no neurologic complaints. She denies any recent fevers, illnesses, or weight loss. She admits to occasional night sweats. She has noted no lymphadenopathy. She has no chest pain or shortness of breath. She denies any nausea, vomiting, constipation, or diarrhea. She has no urinary complaints. Patient offers no further specific complaints today.  REVIEW OF SYSTEMS:   Review of Systems  Constitutional: Negative.  Negative for fever, malaise/fatigue and weight loss.  Respiratory: Negative.  Negative for cough and shortness of breath.   Cardiovascular: Negative.  Negative for chest pain and leg swelling.  Gastrointestinal: Negative.  Negative for abdominal pain.  Musculoskeletal: Negative.   Skin: Negative.  Negative for rash.  Neurological: Negative.  Negative for sensory change and weakness.  Psychiatric/Behavioral: The patient is nervous/anxious.     As per HPI. Otherwise, a complete review of systems is negative.  PAST MEDICAL HISTORY: Past Medical History:  Diagnosis Date  . Anxiety   . Cervical cancer (HCC)    hx LEEP  . Cervical intraepithelial neoplasia I   . Depression   . Eating disorder   . History of self-harm   . Insomnia   . Obsession   . Pap smear abnormality of cervix with LGSIL   . Tobacco abuse   . Vitamin B12 deficiency (non anemic)     PAST SURGICAL HISTORY: Past Surgical History:  Procedure Laterality Date  . CERVICAL BIOPSY  W/ LOOP ELECTRODE EXCISION    . TUBAL LIGATION      FAMILY HISTORY: Family History    Problem Relation Age of Onset  . Cancer Mother        thyroid  . Alcohol abuse Father   . Depression Sister   . Cancer Sister        cevical  . Alcohol abuse Brother   . Depression Brother   . Bipolar disorder Brother   . ADD / ADHD Son     ADVANCED DIRECTIVES (Y/N):  N  HEALTH MAINTENANCE: Social History  Substance Use Topics  . Smoking status: Current Every Day Smoker    Packs/day: 1.00    Years: 30.00    Start date: 09/25/1986  . Smokeless tobacco: Never Used  . Alcohol use 0.0 oz/week     Comment: rarely     Colonoscopy:  PAP:  Bone density:  Lipid panel:  No Known Allergies  Current Outpatient Prescriptions  Medication Sig Dispense Refill  . ibuprofen (ADVIL,MOTRIN) 600 MG tablet Take 1 tablet (600 mg total) by mouth every 6 (six) hours as needed for moderate pain. 15 tablet 0  . omeprazole (PRILOSEC) 40 MG capsule     . traMADol (ULTRAM) 50 MG tablet Take 50 mg by mouth every 6 (six) hours as needed.   1  . meloxicam (MOBIC) 15 MG tablet      No current facility-administered medications for this visit.     OBJECTIVE: Vitals:   01/22/17 1145  BP: 132/73  Pulse: 80  Resp: 20  Temp: 97.9 F (36.6 C)     Body mass index is 23.52 kg/m.    ECOG FS:0 - Asymptomatic  General: Well-developed, well-nourished, no acute distress. Eyes: Pink conjunctiva, anicteric sclera. Lungs: Clear to auscultation bilaterally. Heart: Regular rate and rhythm. No rubs, murmurs, or gallops. Abdomen: Soft, nontender, nondistended. No organomegaly noted, normoactive bowel sounds. Musculoskeletal: No edema, cyanosis, or clubbing. Neuro: Alert, answering all questions appropriately. Cranial nerves grossly intact. Skin: No rashes or petechiae noted. Psych: Normal affect. Lymphatics: Minimally palpable bilateral cervical lymphadenopathy. No other lymphadenopathy palpated.  LAB RESULTS:  Lab Results  Component Value Date   NA 138 09/23/2016   K 4.4 09/23/2016   CL 107  09/23/2016   CO2 26 09/23/2016   GLUCOSE 68 09/23/2016   BUN 18 09/23/2016   CREATININE 0.80 09/23/2016   CALCIUM 8.6 (L) 09/23/2016   PROT 6.6 09/23/2016   ALBUMIN 3.9 09/23/2016   AST 22 09/23/2016   ALT 16 09/23/2016   ALKPHOS 49 09/23/2016   BILITOT 0.5 09/23/2016   GFRNONAA >60 09/23/2016   GFRAA >60 09/23/2016    Lab Results  Component Value Date   WBC 35.6 (H) 01/07/2017   NEUTROABS 3.3 01/07/2017   HGB 13.9 01/07/2017   HCT 41.2 01/07/2017   MCV 96.2 01/07/2017   PLT 183 01/07/2017     STUDIES: Ct Chest W Contrast  Result Date: 01/21/2017 CLINICAL DATA:  Night sweats. Left neck mass. Chronic lymphocytic leukemia. EXAM: CT CHEST, ABDOMEN, AND PELVIS WITH CONTRAST TECHNIQUE: Multidetector CT imaging of the chest, abdomen and pelvis was performed following the standard protocol during bolus administration of intravenous contrast. CONTRAST:  126mL ISOVUE-300 IOPAMIDOL (ISOVUE-300) INJECTION 61% COMPARISON:  Chest radiograph 09/23/2016 FINDINGS: CT CHEST FINDINGS Cardiovascular: Unremarkable Mediastinum/Nodes: Pathologic bilateral supraclavicular, bilateral axillary axillary, bilateral subpectoral, paratracheal, subcarinal, right hilar, and bilateral infrahilar adenopathy. A representative left lower axillary lymph node measures 1.9 cm in short axis on image 22/2. A representative right lower paratracheal node measures 1.3 cm in short axis on image 23/2. Lungs/Pleura: Centrilobular emphysema. Along the minor fissure a 4 mm in short axis subpleural lymph node is present. Several additional sized nodules are also present along the minor fissure. Musculoskeletal: Unremarkable CT ABDOMEN PELVIS FINDINGS Hepatobiliary: Unremarkable Pancreas: Unremarkable Spleen: Unremarkable.  No splenomegaly. Adrenals/Urinary Tract: 1.0 by 1.9 cm mass of the medial limb left adrenal gland, portal venous phase density 63 Hounsfield units, delayed phase density 33 Hounsfield units, relative washout 48%  compatible with adrenal adenoma. Otherwise unremarkable. The kidneys appear normal. Stomach/Bowel: Unremarkable Vascular/Lymphatic: Bulky para-aortic adenopathy in addition to gastrohepatic ligament, porta hepatis, mesenteric, common iliac, pelvic sidewall, and external iliac adenopathy. A left periaortic lymph node confluent with the left psoas muscle measures 4.8 cm in short axis on image 79/2. A left pelvic sidewall lymph node measures 3.1 cm in short axis on image 111/2. A representative mesenteric lymph node measures 1.9 cm in short axis on image 90/2. Smaller inguinal lymph nodes are present bilaterally. Mild aortoiliac atherosclerotic calcification. Reproductive: Retroverted uterus.  Otherwise unremarkable. Other: No supplemental non-categorized findings. Musculoskeletal: Unremarkable IMPRESSION: 1. Considerable thoracic, abdominal, and pelvic adenopathy as detailed above, compatible with active leukemia or lymphoma. No splenomegaly. 2. Aortic Atherosclerosis (ICD10-I70.0) and Emphysema (ICD10-J43.9). 3. Small left adrenal adenoma. 4. Nodularity along the right middle lobe side of the minor fissure, probably from subpleural lymph nodes. Electronically Signed   By: Van Clines M.D.   On: 01/21/2017 16:10   Ct Abdomen Pelvis W Contrast  Result Date: 01/21/2017 CLINICAL DATA:  Night sweats. Left neck mass. Chronic lymphocytic leukemia. EXAM: CT CHEST, ABDOMEN, AND PELVIS WITH CONTRAST TECHNIQUE: Multidetector CT imaging  of the chest, abdomen and pelvis was performed following the standard protocol during bolus administration of intravenous contrast. CONTRAST:  164mL ISOVUE-300 IOPAMIDOL (ISOVUE-300) INJECTION 61% COMPARISON:  Chest radiograph 09/23/2016 FINDINGS: CT CHEST FINDINGS Cardiovascular: Unremarkable Mediastinum/Nodes: Pathologic bilateral supraclavicular, bilateral axillary axillary, bilateral subpectoral, paratracheal, subcarinal, right hilar, and bilateral infrahilar adenopathy. A  representative left lower axillary lymph node measures 1.9 cm in short axis on image 22/2. A representative right lower paratracheal node measures 1.3 cm in short axis on image 23/2. Lungs/Pleura: Centrilobular emphysema. Along the minor fissure a 4 mm in short axis subpleural lymph node is present. Several additional sized nodules are also present along the minor fissure. Musculoskeletal: Unremarkable CT ABDOMEN PELVIS FINDINGS Hepatobiliary: Unremarkable Pancreas: Unremarkable Spleen: Unremarkable.  No splenomegaly. Adrenals/Urinary Tract: 1.0 by 1.9 cm mass of the medial limb left adrenal gland, portal venous phase density 63 Hounsfield units, delayed phase density 33 Hounsfield units, relative washout 48% compatible with adrenal adenoma. Otherwise unremarkable. The kidneys appear normal. Stomach/Bowel: Unremarkable Vascular/Lymphatic: Bulky para-aortic adenopathy in addition to gastrohepatic ligament, porta hepatis, mesenteric, common iliac, pelvic sidewall, and external iliac adenopathy. A left periaortic lymph node confluent with the left psoas muscle measures 4.8 cm in short axis on image 79/2. A left pelvic sidewall lymph node measures 3.1 cm in short axis on image 111/2. A representative mesenteric lymph node measures 1.9 cm in short axis on image 90/2. Smaller inguinal lymph nodes are present bilaterally. Mild aortoiliac atherosclerotic calcification. Reproductive: Retroverted uterus.  Otherwise unremarkable. Other: No supplemental non-categorized findings. Musculoskeletal: Unremarkable IMPRESSION: 1. Considerable thoracic, abdominal, and pelvic adenopathy as detailed above, compatible with active leukemia or lymphoma. No splenomegaly. 2. Aortic Atherosclerosis (ICD10-I70.0) and Emphysema (ICD10-J43.9). 3. Small left adrenal adenoma. 4. Nodularity along the right middle lobe side of the minor fissure, probably from subpleural lymph nodes. Electronically Signed   By: Van Clines M.D.   On: 01/21/2017  16:10    ASSESSMENT: CLL, Rai stage 1  PLAN:    1. CLL: Confirmed by peripheral blood flow cytometry. Patient's white blood count has slowly increased over the past 6 weeks. CT scan results reviewed independently and reported as above with significant lymphadenopathy. Patient is also symptomatic with reported night sweats. After lengthy discussion with the patient, she has agreed to proceed with single agent Rituxan weekly 4. Return to clinic on Jan 30, 2017 for consideration of cycle 1. 2. Lymphadenopathy: Secondary CLL. Patient does not require further ENT follow-up for biopsy.   Approximately 30 minutes was spent in discussion of which greater than 50% was consultation.  Patient expressed understanding and was in agreement with this plan. She also understands that She can call clinic at any time with any questions, concerns, or complaints.    Lloyd Huger, MD   01/24/2017 4:21 PM

## 2017-01-22 ENCOUNTER — Inpatient Hospital Stay: Payer: BLUE CROSS/BLUE SHIELD | Admitting: *Deleted

## 2017-01-22 ENCOUNTER — Inpatient Hospital Stay (HOSPITAL_BASED_OUTPATIENT_CLINIC_OR_DEPARTMENT_OTHER): Payer: BLUE CROSS/BLUE SHIELD | Admitting: Oncology

## 2017-01-22 VITALS — BP 132/73 | HR 80 | Temp 97.9°F | Resp 20 | Wt 150.2 lb

## 2017-01-22 DIAGNOSIS — G4700 Insomnia, unspecified: Secondary | ICD-10-CM | POA: Diagnosis not present

## 2017-01-22 DIAGNOSIS — C9110 Chronic lymphocytic leukemia of B-cell type not having achieved remission: Secondary | ICD-10-CM

## 2017-01-22 DIAGNOSIS — F329 Major depressive disorder, single episode, unspecified: Secondary | ICD-10-CM

## 2017-01-22 DIAGNOSIS — F17210 Nicotine dependence, cigarettes, uncomplicated: Secondary | ICD-10-CM

## 2017-01-22 DIAGNOSIS — Z79899 Other long term (current) drug therapy: Secondary | ICD-10-CM | POA: Diagnosis not present

## 2017-01-22 DIAGNOSIS — N854 Malposition of uterus: Secondary | ICD-10-CM

## 2017-01-22 DIAGNOSIS — J439 Emphysema, unspecified: Secondary | ICD-10-CM

## 2017-01-22 DIAGNOSIS — D3502 Benign neoplasm of left adrenal gland: Secondary | ICD-10-CM

## 2017-01-22 DIAGNOSIS — Z915 Personal history of self-harm: Secondary | ICD-10-CM

## 2017-01-22 DIAGNOSIS — I700 Atherosclerosis of aorta: Secondary | ICD-10-CM | POA: Diagnosis not present

## 2017-01-22 DIAGNOSIS — R590 Localized enlarged lymph nodes: Secondary | ICD-10-CM | POA: Diagnosis not present

## 2017-01-22 DIAGNOSIS — R591 Generalized enlarged lymph nodes: Secondary | ICD-10-CM | POA: Diagnosis not present

## 2017-01-22 DIAGNOSIS — Z5112 Encounter for antineoplastic immunotherapy: Secondary | ICD-10-CM | POA: Diagnosis not present

## 2017-01-22 DIAGNOSIS — E538 Deficiency of other specified B group vitamins: Secondary | ICD-10-CM

## 2017-01-22 DIAGNOSIS — F419 Anxiety disorder, unspecified: Secondary | ICD-10-CM | POA: Diagnosis not present

## 2017-01-22 DIAGNOSIS — Z8541 Personal history of malignant neoplasm of cervix uteri: Secondary | ICD-10-CM | POA: Diagnosis not present

## 2017-01-22 NOTE — Progress Notes (Signed)
START OFF PATHWAY REGIMEN - Lymphoma and CLL   [id number]:Rituximab (Weekly):   Administer weekly:     Rituximab   **Always confirm dose/schedule in your pharmacy ordering system**    Patient Characteristics: Chronic Lymphocytic Leukemia (CLL), First Line, Treatment Indicated, 17p del (-), Fit Patient, Age < 8, IGHV Mutation (-) Disease Type: Chronic Lymphocytic Leukemia (CLL) Disease Type: Not Applicable Line of therapy: First Line RAI Stage: I Treatment Indicated? Treatment Indicated* 17p Deletion Status: Negative Fit or Frail* Patient? Fit Patient Patient Age: Age < 34 IGHV Mutation: IGHV Mutation (-)  Intent of Therapy: Non-Curative / Palliative Intent, Discussed with Patient

## 2017-01-23 LAB — HEPATITIS PANEL, ACUTE
HEP A IGM: NEGATIVE
HEP B S AG: NEGATIVE
Hep B C IgM: NEGATIVE

## 2017-01-23 LAB — HEPATITIS B SURFACE ANTIGEN: Hepatitis B Surface Ag: NEGATIVE

## 2017-01-23 NOTE — Patient Instructions (Signed)

## 2017-01-28 ENCOUNTER — Encounter (INDEPENDENT_AMBULATORY_CARE_PROVIDER_SITE_OTHER): Payer: Self-pay

## 2017-01-28 ENCOUNTER — Inpatient Hospital Stay: Payer: BLUE CROSS/BLUE SHIELD

## 2017-01-28 NOTE — Progress Notes (Signed)
[place]  [phone] Fax:(336) [phone]  ID: Monique [name] OB: 1971/05/05  MR#: [id number]  UMP#:[id number]  Patient Care Team: [name], MD as PCP - General (Family Medicine)  CHIEF COMPLAINT: CLL.  INTERVAL HISTORY: Patient returns to clinic today for further evaluation and initiation of cycle 1 of 4 of weekly Rituxan. She continues to be highly anxious, but otherwise feels well. She has no neurologic complaints. She denies any recent fevers, illnesses, or weight loss. She admits to occasional night sweats. She has noted no new lymphadenopathy. She has no chest pain or shortness of breath. She denies any nausea, vomiting, constipation, or diarrhea. She has no urinary complaints. Patient offers no further specific complaints today.  REVIEW OF SYSTEMS:   Review of Systems  Constitutional: Negative.  Negative for fever, malaise/fatigue and weight loss.  Respiratory: Negative.  Negative for cough and shortness of breath.   Cardiovascular: Negative.  Negative for chest pain and leg swelling.  Gastrointestinal: Negative.  Negative for abdominal pain.  Musculoskeletal: Negative.   Skin: Negative.  Negative for rash.  Neurological: Negative.  Negative for sensory change and weakness.  Psychiatric/Behavioral: The patient is nervous/anxious.     As per HPI. Otherwise, a complete review of systems is negative.  PAST MEDICAL HISTORY: Past Medical History:  Diagnosis Date  . Anxiety   . Cervical cancer (HCC)    hx LEEP  . Cervical intraepithelial neoplasia I   . Depression   . Eating disorder   . History of self-harm   . Insomnia   . Obsession   . Pap smear abnormality of cervix with LGSIL   . Tobacco abuse   . Vitamin B12 deficiency (non anemic)     PAST SURGICAL HISTORY: Past Surgical History:  Procedure Laterality Date  . CERVICAL BIOPSY  W/ LOOP ELECTRODE EXCISION    . TUBAL LIGATION      FAMILY HISTORY: Family History    Problem Relation Age of Onset  . Cancer Mother        thyroid  . Alcohol abuse Father   . Depression Sister   . Cancer Sister        cevical  . Alcohol abuse Brother   . Depression Brother   . Bipolar disorder Brother   . ADD / ADHD Son     ADVANCED DIRECTIVES (Y/N):  N  HEALTH MAINTENANCE: Social History  Substance Use Topics  . Smoking status: Current Every Day Smoker    Packs/day: 1.00    Years: 30.00    Start date: 09/25/1986  . Smokeless tobacco: Never Used  . Alcohol use 0.0 oz/week     Comment: rarely     Colonoscopy:  PAP:  Bone density:  Lipid panel:  No Known Allergies  Current Outpatient Prescriptions  Medication Sig Dispense Refill  . omeprazole (PRILOSEC) 40 MG capsule     . traMADol (ULTRAM) 50 MG tablet Take 50 mg by mouth every 6 (six) hours as needed.   1  . ibuprofen (ADVIL,MOTRIN) 600 MG tablet Take 1 tablet (600 mg total) by mouth every 6 (six) hours as needed for moderate pain. (Patient not taking: Reported on 01/30/2017) 15 tablet 0  . meloxicam (MOBIC) 15 MG tablet      No current facility-administered medications for this visit.     OBJECTIVE: Vitals:   01/30/17 0910  BP: 106/63  Pulse: 79  Resp: 20  Temp: 98.3 F (36.8 C)     Body mass index is 23.57  kg/m.    ECOG FS:0 - Asymptomatic  General: Well-developed, well-nourished, no acute distress. Eyes: Pink conjunctiva, anicteric sclera. Lungs: Clear to auscultation bilaterally. Heart: Regular rate and rhythm. No rubs, murmurs, or gallops. Abdomen: Soft, nontender, nondistended. No organomegaly noted, normoactive bowel sounds. Musculoskeletal: No edema, cyanosis, or clubbing. Neuro: Alert, answering all questions appropriately. Cranial nerves grossly intact. Skin: No rashes or petechiae noted. Psych: Normal affect. Lymphatics: Minimally palpable bilateral cervical lymphadenopathy. No other lymphadenopathy palpated.  LAB RESULTS:  Lab Results  Component Value Date   NA 138  09/23/2016   K 4.4 09/23/2016   CL 107 09/23/2016   CO2 26 09/23/2016   GLUCOSE 68 09/23/2016   BUN 18 09/23/2016   CREATININE 0.80 09/23/2016   CALCIUM 8.6 (L) 09/23/2016   PROT 6.6 09/23/2016   ALBUMIN 3.9 09/23/2016   AST 22 09/23/2016   ALT 16 09/23/2016   ALKPHOS 49 09/23/2016   BILITOT 0.5 09/23/2016   GFRNONAA >60 09/23/2016   GFRAA >60 09/23/2016    Lab Results  Component Value Date   WBC 32.5 (H) 01/30/2017   NEUTROABS 3.9 01/30/2017   HGB 13.7 01/30/2017   HCT 40.1 01/30/2017   MCV 96.8 01/30/2017   PLT 215 01/30/2017     STUDIES: Ct Chest W Contrast  Result Date: 01/21/2017 CLINICAL DATA:  Night sweats. Left neck mass. Chronic lymphocytic leukemia. EXAM: CT CHEST, ABDOMEN, AND PELVIS WITH CONTRAST TECHNIQUE: Multidetector CT imaging of the chest, abdomen and pelvis was performed following the standard protocol during bolus administration of intravenous contrast. CONTRAST:  150mL ISOVUE-300 IOPAMIDOL (ISOVUE-300) INJECTION 61% COMPARISON:  Chest radiograph 09/23/2016 FINDINGS: CT CHEST FINDINGS Cardiovascular: Unremarkable Mediastinum/Nodes: Pathologic bilateral supraclavicular, bilateral axillary axillary, bilateral subpectoral, paratracheal, subcarinal, right hilar, and bilateral infrahilar adenopathy. A representative left lower axillary lymph node measures 1.9 cm in short axis on image 22/2. A representative right lower paratracheal node measures 1.3 cm in short axis on image 23/2. Lungs/Pleura: Centrilobular emphysema. Along the minor fissure a 4 mm in short axis subpleural lymph node is present. Several additional sized nodules are also present along the minor fissure. Musculoskeletal: Unremarkable CT ABDOMEN PELVIS FINDINGS Hepatobiliary: Unremarkable Pancreas: Unremarkable Spleen: Unremarkable.  No splenomegaly. Adrenals/Urinary Tract: 1.0 by 1.9 cm mass of the medial limb left adrenal gland, portal venous phase density 63 Hounsfield units, delayed phase density 33  Hounsfield units, relative washout 48% compatible with adrenal adenoma. Otherwise unremarkable. The kidneys appear normal. Stomach/Bowel: Unremarkable Vascular/Lymphatic: Bulky para-aortic adenopathy in addition to gastrohepatic ligament, porta hepatis, mesenteric, common iliac, pelvic sidewall, and external iliac adenopathy. A left periaortic lymph node confluent with the left psoas muscle measures 4.8 cm in short axis on image 79/2. A left pelvic sidewall lymph node measures 3.1 cm in short axis on image 111/2. A representative mesenteric lymph node measures 1.9 cm in short axis on image 90/2. Smaller inguinal lymph nodes are present bilaterally. Mild aortoiliac atherosclerotic calcification. Reproductive: Retroverted uterus.  Otherwise unremarkable. Other: No supplemental non-categorized findings. Musculoskeletal: Unremarkable IMPRESSION: 1. Considerable thoracic, abdominal, and pelvic adenopathy as detailed above, compatible with active leukemia or lymphoma. No splenomegaly. 2. Aortic Atherosclerosis (ICD10-I70.0) and Emphysema (ICD10-J43.9). 3. Small left adrenal adenoma. 4. Nodularity along the right middle lobe side of the minor fissure, probably from subpleural lymph nodes. Electronically Signed   By: Van Clines M.D.   On: 01/21/2017 16:10   Ct Abdomen Pelvis W Contrast  Result Date: 01/21/2017 CLINICAL DATA:  Night sweats. Left neck mass. Chronic lymphocytic leukemia. EXAM: CT CHEST,  ABDOMEN, AND PELVIS WITH CONTRAST TECHNIQUE: Multidetector CT imaging of the chest, abdomen and pelvis was performed following the standard protocol during bolus administration of intravenous contrast. CONTRAST:  118mL ISOVUE-300 IOPAMIDOL (ISOVUE-300) INJECTION 61% COMPARISON:  Chest radiograph 09/23/2016 FINDINGS: CT CHEST FINDINGS Cardiovascular: Unremarkable Mediastinum/Nodes: Pathologic bilateral supraclavicular, bilateral axillary axillary, bilateral subpectoral, paratracheal, subcarinal, right hilar, and  bilateral infrahilar adenopathy. A representative left lower axillary lymph node measures 1.9 cm in short axis on image 22/2. A representative right lower paratracheal node measures 1.3 cm in short axis on image 23/2. Lungs/Pleura: Centrilobular emphysema. Along the minor fissure a 4 mm in short axis subpleural lymph node is present. Several additional sized nodules are also present along the minor fissure. Musculoskeletal: Unremarkable CT ABDOMEN PELVIS FINDINGS Hepatobiliary: Unremarkable Pancreas: Unremarkable Spleen: Unremarkable.  No splenomegaly. Adrenals/Urinary Tract: 1.0 by 1.9 cm mass of the medial limb left adrenal gland, portal venous phase density 63 Hounsfield units, delayed phase density 33 Hounsfield units, relative washout 48% compatible with adrenal adenoma. Otherwise unremarkable. The kidneys appear normal. Stomach/Bowel: Unremarkable Vascular/Lymphatic: Bulky para-aortic adenopathy in addition to gastrohepatic ligament, porta hepatis, mesenteric, common iliac, pelvic sidewall, and external iliac adenopathy. A left periaortic lymph node confluent with the left psoas muscle measures 4.8 cm in short axis on image 79/2. A left pelvic sidewall lymph node measures 3.1 cm in short axis on image 111/2. A representative mesenteric lymph node measures 1.9 cm in short axis on image 90/2. Smaller inguinal lymph nodes are present bilaterally. Mild aortoiliac atherosclerotic calcification. Reproductive: Retroverted uterus.  Otherwise unremarkable. Other: No supplemental non-categorized findings. Musculoskeletal: Unremarkable IMPRESSION: 1. Considerable thoracic, abdominal, and pelvic adenopathy as detailed above, compatible with active leukemia or lymphoma. No splenomegaly. 2. Aortic Atherosclerosis (ICD10-I70.0) and Emphysema (ICD10-J43.9). 3. Small left adrenal adenoma. 4. Nodularity along the right middle lobe side of the minor fissure, probably from subpleural lymph nodes. Electronically Signed   By:  Van Clines M.D.   On: 01/21/2017 16:10    ASSESSMENT: CLL, Rai stage 1  PLAN:    1. CLL: Confirmed by peripheral blood flow cytometry. Patient's white blood count has slowly increased over the past 6 weeks. CT scan results reviewed independently and reported as above with significant lymphadenopathy. Patient is also symptomatic with reported night sweats. After lengthy discussion with the patient, she has agreed to proceed with single agent Rituxan weekly 4. Proceed with cycle 1 of 4 of weekly Rituxan today. Return to clinic in 1 week for cycle 2 and then in 2 weeks for further evaluation and consideration of cycle 3. 2. Lymphadenopathy: Secondary CLL. Patient does not require further ENT follow-up for biopsy.   Approximately 30 minutes was spent in discussion of which greater than 50% was consultation.  Patient expressed understanding and was in agreement with this plan. She also understands that She can call clinic at any time with any questions, concerns, or complaints.    Lloyd Huger, MD   02/01/2017 8:36 AM

## 2017-01-30 ENCOUNTER — Inpatient Hospital Stay (HOSPITAL_BASED_OUTPATIENT_CLINIC_OR_DEPARTMENT_OTHER): Payer: BLUE CROSS/BLUE SHIELD | Admitting: Oncology

## 2017-01-30 ENCOUNTER — Inpatient Hospital Stay: Payer: BLUE CROSS/BLUE SHIELD

## 2017-01-30 VITALS — BP 106/63 | HR 79 | Temp 98.3°F | Resp 20 | Wt 150.5 lb

## 2017-01-30 VITALS — BP 109/71 | HR 75 | Resp 20

## 2017-01-30 DIAGNOSIS — C9110 Chronic lymphocytic leukemia of B-cell type not having achieved remission: Secondary | ICD-10-CM

## 2017-01-30 DIAGNOSIS — N854 Malposition of uterus: Secondary | ICD-10-CM

## 2017-01-30 DIAGNOSIS — Z915 Personal history of self-harm: Secondary | ICD-10-CM | POA: Diagnosis not present

## 2017-01-30 DIAGNOSIS — J439 Emphysema, unspecified: Secondary | ICD-10-CM

## 2017-01-30 DIAGNOSIS — R590 Localized enlarged lymph nodes: Secondary | ICD-10-CM | POA: Diagnosis not present

## 2017-01-30 DIAGNOSIS — D3502 Benign neoplasm of left adrenal gland: Secondary | ICD-10-CM | POA: Diagnosis not present

## 2017-01-30 DIAGNOSIS — F17210 Nicotine dependence, cigarettes, uncomplicated: Secondary | ICD-10-CM | POA: Diagnosis not present

## 2017-01-30 DIAGNOSIS — E538 Deficiency of other specified B group vitamins: Secondary | ICD-10-CM

## 2017-01-30 DIAGNOSIS — F329 Major depressive disorder, single episode, unspecified: Secondary | ICD-10-CM | POA: Diagnosis not present

## 2017-01-30 DIAGNOSIS — F419 Anxiety disorder, unspecified: Secondary | ICD-10-CM

## 2017-01-30 DIAGNOSIS — G4700 Insomnia, unspecified: Secondary | ICD-10-CM

## 2017-01-30 DIAGNOSIS — R591 Generalized enlarged lymph nodes: Secondary | ICD-10-CM

## 2017-01-30 DIAGNOSIS — I700 Atherosclerosis of aorta: Secondary | ICD-10-CM | POA: Diagnosis not present

## 2017-01-30 DIAGNOSIS — Z79899 Other long term (current) drug therapy: Secondary | ICD-10-CM

## 2017-01-30 DIAGNOSIS — Z8541 Personal history of malignant neoplasm of cervix uteri: Secondary | ICD-10-CM | POA: Diagnosis not present

## 2017-01-30 DIAGNOSIS — Z5112 Encounter for antineoplastic immunotherapy: Secondary | ICD-10-CM | POA: Diagnosis not present

## 2017-01-30 LAB — CBC WITH DIFFERENTIAL/PLATELET
BASOS ABS: 0.1 10*3/uL (ref 0–0.1)
BASOS PCT: 0 %
EOS ABS: 0.1 10*3/uL (ref 0–0.7)
EOS PCT: 0 %
HCT: 40.1 % (ref 35.0–47.0)
Hemoglobin: 13.7 g/dL (ref 12.0–16.0)
Lymphocytes Relative: 86 %
Lymphs Abs: 27.7 10*3/uL — ABNORMAL HIGH (ref 1.0–3.6)
MCH: 33.1 pg (ref 26.0–34.0)
MCHC: 34.2 g/dL (ref 32.0–36.0)
MCV: 96.8 fL (ref 80.0–100.0)
Monocytes Absolute: 0.8 10*3/uL (ref 0.2–0.9)
Monocytes Relative: 2 %
Neutro Abs: 3.9 10*3/uL (ref 1.4–6.5)
Neutrophils Relative %: 12 %
PLATELETS: 215 10*3/uL (ref 150–440)
RBC: 4.15 MIL/uL (ref 3.80–5.20)
RDW: 13.2 % (ref 11.5–14.5)
WBC: 32.5 10*3/uL — AB (ref 3.6–11.0)

## 2017-01-30 MED ORDER — DIPHENHYDRAMINE HCL 50 MG/ML IJ SOLN
25.0000 mg | Freq: Once | INTRAMUSCULAR | Status: AC
  Administered 2017-01-30: 25 mg via INTRAVENOUS
  Filled 2017-01-30: qty 1

## 2017-01-30 MED ORDER — DIPHENHYDRAMINE HCL 25 MG PO CAPS
25.0000 mg | ORAL_CAPSULE | Freq: Once | ORAL | Status: AC
  Administered 2017-01-30: 25 mg via ORAL
  Filled 2017-01-30: qty 1

## 2017-01-30 MED ORDER — SODIUM CHLORIDE 0.9 % IV SOLN
Freq: Once | INTRAVENOUS | Status: AC
  Administered 2017-01-30: 10:00:00 via INTRAVENOUS
  Filled 2017-01-30: qty 1000

## 2017-01-30 MED ORDER — ACETAMINOPHEN 325 MG PO TABS
650.0000 mg | ORAL_TABLET | Freq: Once | ORAL | Status: AC
  Administered 2017-01-30: 650 mg via ORAL
  Filled 2017-01-30: qty 2

## 2017-01-30 MED ORDER — SODIUM CHLORIDE 0.9 % IV SOLN
Freq: Once | INTRAVENOUS | Status: AC
  Administered 2017-01-30: 14:00:00 via INTRAVENOUS
  Filled 2017-01-30: qty 4

## 2017-01-30 MED ORDER — SODIUM CHLORIDE 0.9 % IV SOLN
375.0000 mg/m2 | Freq: Once | INTRAVENOUS | Status: AC
  Administered 2017-01-30: 700 mg via INTRAVENOUS
  Filled 2017-01-30: qty 50

## 2017-01-30 NOTE — Progress Notes (Signed)
Patient denies any concerns today.  

## 2017-01-30 NOTE — Progress Notes (Signed)
Patient states, "My ears are itching on the inside and the back of my throat is also itching." Rituxan stopped. Vital signs obtained and stable, see flowsheet. MD, [name], notified via telephone. MD to place order for Benadryl, see MAR. Per MD order: give Benadryl 25mg  IV once now. Wait 15 minutes or until patient's symptoms resolved and then restart Rituxan at previous rate.  1247- Patient states, "I feel better and do not have anymore itching." All symptoms resolved at this time. Rituxan restarted at previous rate. Will continue to monitor patient for any further signs/symptoms.  1350- Patient states, "I am very hot and nauseated. I feel like I'm going to be sick." Rituxan stopped. VSS, see flowsheet. Patient sitting in chair by the window and room temperature is noticeably hotter in this area. MD, [name], notified via telephone. Per MD order: Zofran 8mg  IVPB once order entered and to be given at this time. Patient moved to a cooler room. Once patient's symptoms have resolved, restart Rituxan at previous rate.   1440-  Patient states, "I feel better. I am ready to restart the medicine." All symptoms resolved at this time. MD, [name], notified. Per MD order: Rituxan restarted at previous rate. Will continue to monitor patient for any further signs/symptoms.  1618- Rituxan infusion completed. Patient does not have any further signs/symptoms at this time. Patient discharged to home.

## 2017-02-05 DIAGNOSIS — M5416 Radiculopathy, lumbar region: Secondary | ICD-10-CM | POA: Diagnosis not present

## 2017-02-06 ENCOUNTER — Inpatient Hospital Stay: Payer: BLUE CROSS/BLUE SHIELD

## 2017-02-06 VITALS — BP 122/76 | HR 74 | Temp 96.5°F | Resp 18

## 2017-02-06 DIAGNOSIS — J439 Emphysema, unspecified: Secondary | ICD-10-CM | POA: Diagnosis not present

## 2017-02-06 DIAGNOSIS — G4700 Insomnia, unspecified: Secondary | ICD-10-CM | POA: Diagnosis not present

## 2017-02-06 DIAGNOSIS — D3502 Benign neoplasm of left adrenal gland: Secondary | ICD-10-CM | POA: Diagnosis not present

## 2017-02-06 DIAGNOSIS — C9110 Chronic lymphocytic leukemia of B-cell type not having achieved remission: Secondary | ICD-10-CM

## 2017-02-06 DIAGNOSIS — R590 Localized enlarged lymph nodes: Secondary | ICD-10-CM | POA: Diagnosis not present

## 2017-02-06 DIAGNOSIS — Z5112 Encounter for antineoplastic immunotherapy: Secondary | ICD-10-CM | POA: Diagnosis not present

## 2017-02-06 DIAGNOSIS — E538 Deficiency of other specified B group vitamins: Secondary | ICD-10-CM | POA: Diagnosis not present

## 2017-02-06 DIAGNOSIS — Z79899 Other long term (current) drug therapy: Secondary | ICD-10-CM | POA: Diagnosis not present

## 2017-02-06 DIAGNOSIS — N854 Malposition of uterus: Secondary | ICD-10-CM | POA: Diagnosis not present

## 2017-02-06 DIAGNOSIS — I700 Atherosclerosis of aorta: Secondary | ICD-10-CM | POA: Diagnosis not present

## 2017-02-06 DIAGNOSIS — F419 Anxiety disorder, unspecified: Secondary | ICD-10-CM | POA: Diagnosis not present

## 2017-02-06 DIAGNOSIS — R591 Generalized enlarged lymph nodes: Secondary | ICD-10-CM | POA: Diagnosis not present

## 2017-02-06 DIAGNOSIS — F17210 Nicotine dependence, cigarettes, uncomplicated: Secondary | ICD-10-CM | POA: Diagnosis not present

## 2017-02-06 DIAGNOSIS — Z915 Personal history of self-harm: Secondary | ICD-10-CM | POA: Diagnosis not present

## 2017-02-06 DIAGNOSIS — F329 Major depressive disorder, single episode, unspecified: Secondary | ICD-10-CM | POA: Diagnosis not present

## 2017-02-06 DIAGNOSIS — Z8541 Personal history of malignant neoplasm of cervix uteri: Secondary | ICD-10-CM | POA: Diagnosis not present

## 2017-02-06 LAB — CBC WITH DIFFERENTIAL/PLATELET
Basophils Absolute: 0 10*3/uL (ref 0–0.1)
Basophils Relative: 0 %
Eosinophils Absolute: 0 10*3/uL (ref 0–0.7)
Eosinophils Relative: 0 %
HEMATOCRIT: 39.5 % (ref 35.0–47.0)
Hemoglobin: 13.3 g/dL (ref 12.0–16.0)
LYMPHS PCT: 73 %
Lymphs Abs: 13 10*3/uL — ABNORMAL HIGH (ref 1.0–3.6)
MCH: 32.9 pg (ref 26.0–34.0)
MCHC: 33.7 g/dL (ref 32.0–36.0)
MCV: 97.6 fL (ref 80.0–100.0)
MONO ABS: 0.2 10*3/uL (ref 0.2–0.9)
MONOS PCT: 1 %
NEUTROS ABS: 4.7 10*3/uL (ref 1.4–6.5)
Neutrophils Relative %: 26 %
Platelets: 190 10*3/uL (ref 150–440)
RBC: 4.05 MIL/uL (ref 3.80–5.20)
RDW: 13 % (ref 11.5–14.5)
WBC: 18.1 10*3/uL — ABNORMAL HIGH (ref 3.6–11.0)

## 2017-02-06 MED ORDER — SODIUM CHLORIDE 0.9 % IV SOLN
375.0000 mg/m2 | Freq: Once | INTRAVENOUS | Status: AC
  Administered 2017-02-06: 700 mg via INTRAVENOUS
  Filled 2017-02-06: qty 50

## 2017-02-06 MED ORDER — SODIUM CHLORIDE 0.9 % IV SOLN
Freq: Once | INTRAVENOUS | Status: AC
  Administered 2017-02-06: 09:00:00 via INTRAVENOUS
  Filled 2017-02-06: qty 1000

## 2017-02-06 MED ORDER — SODIUM CHLORIDE 0.9 % IV SOLN: 375.0000 mg/m2 | Freq: Once | INTRAVENOUS | Status: DC

## 2017-02-06 MED ORDER — ACETAMINOPHEN 325 MG PO TABS
650.0000 mg | ORAL_TABLET | Freq: Once | ORAL | Status: AC
  Administered 2017-02-06: 650 mg via ORAL
  Filled 2017-02-06: qty 2

## 2017-02-06 MED ORDER — DIPHENHYDRAMINE HCL 25 MG PO CAPS
25.0000 mg | ORAL_CAPSULE | Freq: Once | ORAL | Status: AC
  Administered 2017-02-06: 25 mg via ORAL
  Filled 2017-02-06: qty 1

## 2017-02-12 NOTE — Progress Notes (Signed)
[name]  [phone] [phone]  ID: [name] OB: Feb 16, 1971  MR#: [id number]  [id number]#:[id number]  Patient Care Team: [name], MD as PCP - General (Family Medicine)  CHIEF COMPLAINT: CLL.  INTERVAL HISTORY: Patient returns to clinic today for further evaluation and initiation of cycle 3 of 4 of weekly Rituxan. She continues to be highly anxious, but otherwise feels well. She has no neurologic complaints. She denies any recent fevers, illnesses, or weight loss. She admits to occasional night sweats. She has noted no new lymphadenopathy. She has no chest pain or shortness of breath. She denies any nausea, vomiting, constipation, or diarrhea. She has no urinary complaints. Patient offers no further specific complaints today.  REVIEW OF SYSTEMS:   Review of Systems  Constitutional: Negative.  Negative for fever, malaise/fatigue and weight loss.  Respiratory: Negative.  Negative for cough and shortness of breath.   Cardiovascular: Negative.  Negative for chest pain and leg swelling.  Gastrointestinal: Negative.  Negative for abdominal pain.  Genitourinary: Negative.   Musculoskeletal: Negative.   Skin: Negative.  Negative for rash.  Neurological: Negative.  Negative for sensory change and weakness.  Psychiatric/Behavioral: The patient is nervous/anxious.     As per HPI. Otherwise, a complete review of systems is negative.  PAST MEDICAL HISTORY: Past Medical History:  Diagnosis Date  . Anxiety   . Cervical cancer (HCC)    hx LEEP  . Cervical intraepithelial neoplasia I   . Depression   . Eating disorder   . History of self-harm   . Insomnia   . Obsession   . Pap smear abnormality of cervix with LGSIL   . Tobacco abuse   . Vitamin B12 deficiency (non anemic)     PAST SURGICAL HISTORY: Past Surgical History:  Procedure Laterality Date  . CERVICAL BIOPSY  W/ LOOP ELECTRODE EXCISION    . TUBAL LIGATION      FAMILY  HISTORY: Family History  Problem Relation Age of Onset  . Cancer Mother        thyroid  . Alcohol abuse Father   . Depression Sister   . Cancer Sister        cevical  . Alcohol abuse Brother   . Depression Brother   . Bipolar disorder Brother   . ADD / ADHD Son     ADVANCED DIRECTIVES (Y/N):  N  HEALTH MAINTENANCE: Social History  Substance Use Topics  . Smoking status: Current Every Day Smoker    Packs/day: 1.00    Years: 30.00    Start date: 09/25/1986  . Smokeless tobacco: Never Used  . Alcohol use 0.0 oz/week     Comment: rarely     Colonoscopy:  PAP:  Bone density:  Lipid panel:  No Known Allergies  Current Outpatient Prescriptions  Medication Sig Dispense Refill  . cyclobenzaprine (FLEXERIL) 10 MG tablet Take 1 tablet by mouth 2 (two) times daily.    . DULoxetine (CYMBALTA) 30 MG capsule Take 1 capsule by mouth daily.    Marland Kitchen HYDROcodone-acetaminophen (NORCO/VICODIN) 5-325 MG tablet Take 1 tablet by mouth as needed.    . meloxicam (MOBIC) 15 MG tablet     . omeprazole (PRILOSEC) 40 MG capsule     . traMADol (ULTRAM) 50 MG tablet Take 50 mg by mouth every 6 (six) hours as needed.   1   No current facility-administered medications for this visit.     OBJECTIVE: Vitals:   02/13/17 0914  BP: 107/67  Pulse:  71  Resp: 18  Temp: 97.9 F (36.6 C)     Body mass index is 23.59 kg/m.    ECOG FS:0 - Asymptomatic  General: Well-developed, well-nourished, no acute distress. Eyes: Pink conjunctiva, anicteric sclera. Lungs: Clear to auscultation bilaterally. Heart: Regular rate and rhythm. No rubs, murmurs, or gallops. Abdomen: Soft, nontender, nondistended. No organomegaly noted, normoactive bowel sounds. Musculoskeletal: No edema, cyanosis, or clubbing. Neuro: Alert, answering all questions appropriately. Cranial nerves grossly intact. Skin: No rashes or petechiae noted. Psych: Normal affect. Lymphatics: Minimally palpable bilateral cervical lymphadenopathy. No  other lymphadenopathy palpated.  LAB RESULTS:  Lab Results  Component Value Date   NA 138 09/23/2016   K 4.4 09/23/2016   CL 107 09/23/2016   CO2 26 09/23/2016   GLUCOSE 68 09/23/2016   BUN 18 09/23/2016   CREATININE 0.80 09/23/2016   CALCIUM 8.6 (L) 09/23/2016   PROT 6.6 09/23/2016   ALBUMIN 3.9 09/23/2016   AST 22 09/23/2016   ALT 16 09/23/2016   ALKPHOS 49 09/23/2016   BILITOT 0.5 09/23/2016   GFRNONAA >60 09/23/2016   GFRAA >60 09/23/2016    Lab Results  Component Value Date   WBC 43.7 (H) 02/13/2017   NEUTROABS 10.2 (H) 02/13/2017   HGB 13.7 02/13/2017   HCT 40.3 02/13/2017   MCV 97.2 02/13/2017   PLT 303 02/13/2017     STUDIES: Ct Chest W Contrast  Result Date: 01/21/2017 CLINICAL DATA:  Night sweats. Left neck mass. Chronic lymphocytic leukemia. EXAM: CT CHEST, ABDOMEN, AND PELVIS WITH CONTRAST TECHNIQUE: Multidetector CT imaging of the chest, abdomen and pelvis was performed following the standard protocol during bolus administration of intravenous contrast. CONTRAST:  145mL ISOVUE-300 IOPAMIDOL (ISOVUE-300) INJECTION 61% COMPARISON:  Chest radiograph 09/23/2016 FINDINGS: CT CHEST FINDINGS Cardiovascular: Unremarkable Mediastinum/Nodes: Pathologic bilateral supraclavicular, bilateral axillary axillary, bilateral subpectoral, paratracheal, subcarinal, right hilar, and bilateral infrahilar adenopathy. A representative left lower axillary lymph node measures 1.9 cm in short axis on image 22/2. A representative right lower paratracheal node measures 1.3 cm in short axis on image 23/2. Lungs/Pleura: Centrilobular emphysema. Along the minor fissure a 4 mm in short axis subpleural lymph node is present. Several additional sized nodules are also present along the minor fissure. Musculoskeletal: Unremarkable CT ABDOMEN PELVIS FINDINGS Hepatobiliary: Unremarkable Pancreas: Unremarkable Spleen: Unremarkable.  No splenomegaly. Adrenals/Urinary Tract: 1.0 by 1.9 cm mass of the medial  limb left adrenal gland, portal venous phase density 63 Hounsfield units, delayed phase density 33 Hounsfield units, relative washout 48% compatible with adrenal adenoma. Otherwise unremarkable. The kidneys appear normal. Stomach/Bowel: Unremarkable Vascular/Lymphatic: Bulky para-aortic adenopathy in addition to gastrohepatic ligament, porta hepatis, mesenteric, common iliac, pelvic sidewall, and external iliac adenopathy. A left periaortic lymph node confluent with the left psoas muscle measures 4.8 cm in short axis on image 79/2. A left pelvic sidewall lymph node measures 3.1 cm in short axis on image 111/2. A representative mesenteric lymph node measures 1.9 cm in short axis on image 90/2. Smaller inguinal lymph nodes are present bilaterally. Mild aortoiliac atherosclerotic calcification. Reproductive: Retroverted uterus.  Otherwise unremarkable. Other: No supplemental non-categorized findings. Musculoskeletal: Unremarkable IMPRESSION: 1. Considerable thoracic, abdominal, and pelvic adenopathy as detailed above, compatible with active leukemia or lymphoma. No splenomegaly. 2. Aortic Atherosclerosis (ICD10-I70.0) and Emphysema (ICD10-J43.9). 3. Small left adrenal adenoma. 4. Nodularity along the right middle lobe side of the minor fissure, probably from subpleural lymph nodes. Electronically Signed   By: Van Clines M.D.   On: 01/21/2017 16:10   Ct Abdomen Pelvis  W Contrast  Result Date: 01/21/2017 CLINICAL DATA:  Night sweats. Left neck mass. Chronic lymphocytic leukemia. EXAM: CT CHEST, ABDOMEN, AND PELVIS WITH CONTRAST TECHNIQUE: Multidetector CT imaging of the chest, abdomen and pelvis was performed following the standard protocol during bolus administration of intravenous contrast. CONTRAST:  152mL ISOVUE-300 IOPAMIDOL (ISOVUE-300) INJECTION 61% COMPARISON:  Chest radiograph 09/23/2016 FINDINGS: CT CHEST FINDINGS Cardiovascular: Unremarkable Mediastinum/Nodes: Pathologic bilateral supraclavicular,  bilateral axillary axillary, bilateral subpectoral, paratracheal, subcarinal, right hilar, and bilateral infrahilar adenopathy. A representative left lower axillary lymph node measures 1.9 cm in short axis on image 22/2. A representative right lower paratracheal node measures 1.3 cm in short axis on image 23/2. Lungs/Pleura: Centrilobular emphysema. Along the minor fissure a 4 mm in short axis subpleural lymph node is present. Several additional sized nodules are also present along the minor fissure. Musculoskeletal: Unremarkable CT ABDOMEN PELVIS FINDINGS Hepatobiliary: Unremarkable Pancreas: Unremarkable Spleen: Unremarkable.  No splenomegaly. Adrenals/Urinary Tract: 1.0 by 1.9 cm mass of the medial limb left adrenal gland, portal venous phase density 63 Hounsfield units, delayed phase density 33 Hounsfield units, relative washout 48% compatible with adrenal adenoma. Otherwise unremarkable. The kidneys appear normal. Stomach/Bowel: Unremarkable Vascular/Lymphatic: Bulky para-aortic adenopathy in addition to gastrohepatic ligament, porta hepatis, mesenteric, common iliac, pelvic sidewall, and external iliac adenopathy. A left periaortic lymph node confluent with the left psoas muscle measures 4.8 cm in short axis on image 79/2. A left pelvic sidewall lymph node measures 3.1 cm in short axis on image 111/2. A representative mesenteric lymph node measures 1.9 cm in short axis on image 90/2. Smaller inguinal lymph nodes are present bilaterally. Mild aortoiliac atherosclerotic calcification. Reproductive: Retroverted uterus.  Otherwise unremarkable. Other: No supplemental non-categorized findings. Musculoskeletal: Unremarkable IMPRESSION: 1. Considerable thoracic, abdominal, and pelvic adenopathy as detailed above, compatible with active leukemia or lymphoma. No splenomegaly. 2. Aortic Atherosclerosis (ICD10-I70.0) and Emphysema (ICD10-J43.9). 3. Small left adrenal adenoma. 4. Nodularity along the right middle lobe  side of the minor fissure, probably from subpleural lymph nodes. Electronically Signed   By: Van Clines M.D.   On: 01/21/2017 16:10    ASSESSMENT: CLL, Rai stage 1  PLAN:    1. CLL: Confirmed by peripheral blood flow cytometry. CT scan results reviewed independently and reported as above with significant lymphadenopathy. Patient is also symptomatic with reported night sweats. After lengthy discussion with the patient, she has agreed to proceed with single agent Rituxan weekly 4. Proceed with cycle 3 of 4 of weekly Rituxan today. Return to clinic in 1 week for further evaluation and consideration of cycle 4.  Will re-image approximately 3 months after cycle 4.  2. Lymphadenopathy: Secondary CLL. Patient does not require further ENT follow-up for biopsy.   Approximately 30 minutes was spent in discussion of which greater than 50% was consultation.  Patient expressed understanding and was in agreement with this plan. She also understands that She can call clinic at any time with any questions, concerns, or complaints.    Lloyd Huger, MD   02/16/2017 10:05 PM

## 2017-02-13 ENCOUNTER — Inpatient Hospital Stay (HOSPITAL_BASED_OUTPATIENT_CLINIC_OR_DEPARTMENT_OTHER): Payer: BLUE CROSS/BLUE SHIELD | Admitting: Oncology

## 2017-02-13 ENCOUNTER — Inpatient Hospital Stay: Payer: BLUE CROSS/BLUE SHIELD

## 2017-02-13 VITALS — BP 107/67 | HR 71 | Temp 97.9°F | Resp 18 | Wt 150.6 lb

## 2017-02-13 DIAGNOSIS — F329 Major depressive disorder, single episode, unspecified: Secondary | ICD-10-CM | POA: Diagnosis not present

## 2017-02-13 DIAGNOSIS — Z8541 Personal history of malignant neoplasm of cervix uteri: Secondary | ICD-10-CM

## 2017-02-13 DIAGNOSIS — Z915 Personal history of self-harm: Secondary | ICD-10-CM | POA: Diagnosis not present

## 2017-02-13 DIAGNOSIS — F419 Anxiety disorder, unspecified: Secondary | ICD-10-CM

## 2017-02-13 DIAGNOSIS — C9110 Chronic lymphocytic leukemia of B-cell type not having achieved remission: Secondary | ICD-10-CM

## 2017-02-13 DIAGNOSIS — G4700 Insomnia, unspecified: Secondary | ICD-10-CM | POA: Diagnosis not present

## 2017-02-13 DIAGNOSIS — R591 Generalized enlarged lymph nodes: Secondary | ICD-10-CM

## 2017-02-13 DIAGNOSIS — N854 Malposition of uterus: Secondary | ICD-10-CM

## 2017-02-13 DIAGNOSIS — Z79899 Other long term (current) drug therapy: Secondary | ICD-10-CM

## 2017-02-13 DIAGNOSIS — F17210 Nicotine dependence, cigarettes, uncomplicated: Secondary | ICD-10-CM

## 2017-02-13 DIAGNOSIS — R590 Localized enlarged lymph nodes: Secondary | ICD-10-CM

## 2017-02-13 DIAGNOSIS — D3502 Benign neoplasm of left adrenal gland: Secondary | ICD-10-CM

## 2017-02-13 DIAGNOSIS — I700 Atherosclerosis of aorta: Secondary | ICD-10-CM

## 2017-02-13 DIAGNOSIS — J439 Emphysema, unspecified: Secondary | ICD-10-CM

## 2017-02-13 DIAGNOSIS — E538 Deficiency of other specified B group vitamins: Secondary | ICD-10-CM | POA: Diagnosis not present

## 2017-02-13 DIAGNOSIS — Z5112 Encounter for antineoplastic immunotherapy: Secondary | ICD-10-CM | POA: Diagnosis not present

## 2017-02-13 LAB — CBC WITH DIFFERENTIAL/PLATELET
BASOS PCT: 1 %
Basophils Absolute: 0.2 10*3/uL — ABNORMAL HIGH (ref 0–0.1)
EOS ABS: 0 10*3/uL (ref 0–0.7)
EOS PCT: 0 %
HCT: 40.3 % (ref 35.0–47.0)
HEMOGLOBIN: 13.7 g/dL (ref 12.0–16.0)
LYMPHS ABS: 32.3 10*3/uL — AB (ref 1.0–3.6)
Lymphocytes Relative: 74 %
MCH: 33.1 pg (ref 26.0–34.0)
MCHC: 34 g/dL (ref 32.0–36.0)
MCV: 97.2 fL (ref 80.0–100.0)
Monocytes Absolute: 0.9 10*3/uL (ref 0.2–0.9)
Monocytes Relative: 2 %
Neutro Abs: 10.2 10*3/uL — ABNORMAL HIGH (ref 1.4–6.5)
Neutrophils Relative %: 23 %
PLATELETS: 303 10*3/uL (ref 150–440)
RBC: 4.15 MIL/uL (ref 3.80–5.20)
RDW: 13.1 % (ref 11.5–14.5)
WBC: 43.7 10*3/uL — ABNORMAL HIGH (ref 3.6–11.0)

## 2017-02-13 MED ORDER — SODIUM CHLORIDE 0.9 % IV SOLN
375.0000 mg/m2 | Freq: Once | INTRAVENOUS | Status: DC
Start: 2017-02-13 — End: 2017-02-13

## 2017-02-13 MED ORDER — DIPHENHYDRAMINE HCL 25 MG PO CAPS
25.0000 mg | ORAL_CAPSULE | Freq: Once | ORAL | Status: AC
  Administered 2017-02-13: 25 mg via ORAL
  Filled 2017-02-13: qty 1

## 2017-02-13 MED ORDER — ACETAMINOPHEN 325 MG PO TABS
650.0000 mg | ORAL_TABLET | Freq: Once | ORAL | Status: AC
  Administered 2017-02-13: 650 mg via ORAL
  Filled 2017-02-13: qty 2

## 2017-02-13 MED ORDER — SODIUM CHLORIDE 0.9 % IV SOLN
Freq: Once | INTRAVENOUS | Status: AC
  Administered 2017-02-13: 10:00:00 via INTRAVENOUS
  Filled 2017-02-13: qty 1000

## 2017-02-13 MED ORDER — SODIUM CHLORIDE 0.9 % IV SOLN
375.0000 mg/m2 | Freq: Once | INTRAVENOUS | Status: AC
  Administered 2017-02-13: 700 mg via INTRAVENOUS
  Filled 2017-02-13: qty 50

## 2017-02-13 NOTE — Progress Notes (Signed)
States that continues to feel lymph nodes in neck still enlarged. Pt concerned that treatment is not working after receiving 2 treatments. Reassured pt that will monitor blood counts for treatment efficacy.

## 2017-02-18 ENCOUNTER — Ambulatory Visit (INDEPENDENT_AMBULATORY_CARE_PROVIDER_SITE_OTHER): Payer: BLUE CROSS/BLUE SHIELD | Admitting: Family Medicine

## 2017-02-18 ENCOUNTER — Encounter: Payer: Self-pay | Admitting: Family Medicine

## 2017-02-18 VITALS — BP 118/68 | HR 104 | Temp 98.2°F | Resp 16 | Ht 67.0 in | Wt 145.5 lb

## 2017-02-18 DIAGNOSIS — Z720 Tobacco use: Secondary | ICD-10-CM

## 2017-02-18 DIAGNOSIS — J181 Lobar pneumonia, unspecified organism: Secondary | ICD-10-CM

## 2017-02-18 DIAGNOSIS — F411 Generalized anxiety disorder: Secondary | ICD-10-CM | POA: Diagnosis not present

## 2017-02-18 DIAGNOSIS — J189 Pneumonia, unspecified organism: Secondary | ICD-10-CM

## 2017-02-18 MED ORDER — AZITHROMYCIN 250 MG PO TABS: ORAL_TABLET | ORAL | 0 refills | Status: DC

## 2017-02-18 MED ORDER — BENZONATATE 100 MG PO CAPS: 100.0000 mg | ORAL_CAPSULE | Freq: Two times a day (BID) | ORAL | 0 refills | Status: DC | PRN

## 2017-02-18 MED ORDER — DULOXETINE HCL 30 MG PO CPEP: 30.0000 mg | ORAL_CAPSULE | Freq: Every day | ORAL | 0 refills | Status: DC

## 2017-02-18 MED ORDER — ALBUTEROL SULFATE HFA 108 (90 BASE) MCG/ACT IN AERS: 2.0000 | INHALATION_SPRAY | Freq: Four times a day (QID) | RESPIRATORY_TRACT | 2 refills | Status: DC | PRN

## 2017-02-18 NOTE — Patient Instructions (Addendum)
Please call if not improving in 2-3 days. Please drink plenty of fluids.  Smoking Tobacco Information Smoking tobacco will very likely harm your health. Tobacco contains a poisonous (toxic), colorless chemical called nicotine. Nicotine affects the brain and makes tobacco addictive. This change in your brain can make it hard to stop smoking. Tobacco also has other toxic chemicals that can hurt your body and raise your risk of many cancers. How can smoking tobacco affect me? Smoking tobacco can increase your chances of having serious health conditions, such as:  Cancer. Smoking is most commonly associated with lung cancer, but can lead to cancer in other parts of the body.  Chronic obstructive pulmonary disease (COPD). This is a long-term lung condition that makes it hard to breathe. It also gets worse over time.  High blood pressure (hypertension), heart disease, stroke, or heart attack.  Lung infections, such as pneumonia.  Cataracts. This is when the lenses in the eyes become clouded.  Digestive problems. This may include peptic ulcers, heartburn, and gastroesophageal reflux disease (GERD).  Oral health problems, such as gum disease and tooth loss.  Loss of taste and smell.  Smoking can affect your appearance by causing:  Wrinkles.  Yellow or stained teeth, fingers, and fingernails.  Smoking tobacco can also affect your social life.  Many workplaces, [employer], hotels, and public places are tobacco-free. This means that you may experience challenges in finding places to smoke when away from home.  The cost of a smoking habit can be expensive. Expenses for someone who smokes come in two ways: ? You spend money on a regular basis to buy tobacco. ? Your health care costs in the long-term are higher if you smoke.  Tobacco smoke can also affect the health of those around you. Children of smokers have greater chances of: ? Sudden infant death syndrome (SIDS). ? Ear  infections. ? Lung infections.  What lifestyle changes can be made?  Do not start smoking. Quit if you already do.  To quit smoking: ? Make a plan to quit smoking and commit yourself to it. Look for programs to help you and ask your health care provider for recommendations and ideas. ? Talk with your health care provider about using nicotine replacement medicines to help you quit. Medicine replacement medicines include gum, lozenges, patches, sprays, or pills. ? Do not replace cigarette smoking with electronic cigarettes, which are commonly called e-cigarettes. The safety of e-cigarettes is not known, and some may contain harmful chemicals. ? Avoid places, people, or situations that tempt you to smoke. ? If you try to quit but return to smoking, don't give up hope. It is very common for people to try a number of times before they fully succeed. When you feel ready again, give it another try.  Quitting smoking might affect the way you eat as well as your weight. Be prepared to monitor your eating habits. Get support in planning and following a healthy diet.  Ask your health care provider about having regular tests (screenings) to check for cancer. This may include blood tests, imaging tests, and other tests.  Exercise regularly. Consider taking walks, joining a gym, or doing yoga or exercise classes.  Develop skills to manage your stress. These skills include meditation. What are the benefits of quitting smoking? By quitting smoking, you may:  Lower your risk of getting cancer and other diseases caused by smoking.  Live longer.  Breathe better.  Lower your blood pressure and heart rate.  Stop your addiction  to tobacco.  Stop creating secondhand smoke that hurts other people.  Improve your sense of taste and smell.  Look better over time, due to having fewer wrinkles and less staining.  What can happen if changes are not made? If you do not stop smoking, you may:  Get cancer  and other diseases.  Develop COPD or other long-term (chronic) lung conditions.  Develop serious problems with your heart and blood vessels (cardiovascular system).  Need more tests to screen for problems caused by smoking.  Have higher, long-term healthcare costs from medicines or treatments related to smoking.  Continue to have worsening changes in your lungs, mouth, and nose.  Where to find support: To get support to quit smoking, consider:  Asking your health care provider for more information and resources.  Taking classes to learn more about quitting smoking.  Looking for local organizations that offer resources about quitting smoking.  Joining a support group for people who want to quit smoking in your local community.  Where to find more information: You may find more information about quitting smoking from:  HelpGuide.org: www.helpguide.org/articles/addictions/how-to-quit-smoking.htm  https://hall.com/: smokefree.gov  American Lung Association: www.lung.org  Contact a health care provider if:  You have problems breathing.  Your lips, nose, or fingers turn blue.  You have chest pain.  You are coughing up blood.  You feel faint or you pass out.  You have other noticeable changes that cause you to worry. Summary  Smoking tobacco can negatively affect your health, the health of those around you, your finances, and your social life.  Do not start smoking. Quit if you already do. If you need help quitting, ask your health care provider.  Think about joining a support group for people who want to quit smoking in your local community. There are many effective programs that will help you to quit this behavior. This information is not intended to replace advice given to you by your health care provider. Make sure you discuss any questions you have with your health care provider. Document Released: 09/17/2016 Document Revised: 09/17/2016 Document Reviewed:  09/17/2016 Elsevier Interactive Patient Education  [employer].

## 2017-02-18 NOTE — Progress Notes (Addendum)
Name: Monique [name]   MRN: [id number]    DOB: 04/30/1971   Date:02/18/2017       Progress Note  Subjective  Chief Complaint  Chief Complaint  Patient presents with  . Cough    pt has had cough for about 1 week with congestion and pressure pt has not been able to sleep due to cough                                               . Hoarse  . Medication Refill    cymbalta    HPI  Pt presents with 7 day history of moderate shortness of breath, hoarse voice, non-productive congested cough, nasal congestion and fatigue. She is being seen at the cancer center by Dr. Maryjane Hurter and is set to receive her last round of Rituxan in 2 days. No NVD, no chest pain, no fever or chills, no ear pain/pressure, no sinus pain; mild sore throat.  She is a current smoker and would like to start cutting back, but does not want medication intervention at this time. Information for smoking cessation will be provided. Congratulated patient on this decision and explained health benefits of decreasing and eventually quitting smoking.  Anxiety: Pt notes anxiety has been doing well on current dose of Cymbalta (30mg ), she has been keeping a positive outlook and feels like she has a good support system with work and her Children. She is a little concerned about impending medical bills. She needs refill of Cymbalta today.  Patient Active Problem List   Diagnosis Date Noted  . GERD without esophagitis 12/11/2016  . CLL (chronic lymphocytic leukemia) ([place]) 11/24/2016  . Pap smear abnormality of cervix/human papillomavirus (HPV) positive 09/12/2016  . Stress incontinence 09/04/2016  . B12 deficiency 07/10/2015  . Insomnia, persistent 07/10/2015  . Mild episode of recurrent major depressive disorder ([place]) 07/10/2015  . Anorexia nervosa, restricting type 07/10/2015  . Anxiety, generalized 07/10/2015  . H/O suicide attempt 07/10/2015  . Lymphocytosis 07/10/2015  . Obsessive-compulsive disorder 07/10/2015  . Tobacco use  07/10/2015  . History of cervical dysplasia 07/18/2014    Social History  Substance Use Topics  . Smoking status: Current Every Day Smoker    Packs/day: 1.00    Years: 30.00    Start date: 09/25/1986  . Smokeless tobacco: Never Used  . Alcohol use 0.0 oz/week     Comment: rarely     Current Outpatient Prescriptions:  .  DULoxetine (CYMBALTA) 30 MG capsule, Take 1 capsule by mouth daily., Disp: , Rfl:  .  meloxicam (MOBIC) 15 MG tablet, , Disp: , Rfl:  .  omeprazole (PRILOSEC) 40 MG capsule, , Disp: , Rfl:  .  traMADol (ULTRAM) 50 MG tablet, Take 50 mg by mouth every 6 (six) hours as needed. , Disp: , Rfl: 1 .  cyclobenzaprine (FLEXERIL) 10 MG tablet, Take 1 tablet by mouth 2 (two) times daily., Disp: , Rfl:  .  HYDROcodone-acetaminophen (NORCO/VICODIN) 5-325 MG tablet, Take 1 tablet by mouth as needed., Disp: , Rfl:   No Known Allergies  ROS  Ten systems reviewed and is negative except as mentioned in HPI  Objective  Vitals:   02/18/17 1038  BP: 118/68  Pulse: (!) 104  Resp: 16  Temp: 98.2 F (36.8 C)  SpO2: 96%  Weight: 145 lb 8 oz (66 kg)  Height: 5'  7" (1.702 m)    Body mass index is 22.79 kg/m.  Nursing Note and Vital Signs reviewed.  Physical Exam  Constitutional: Patient appears well-developed and well-nourished. Obese No distress.  HEENT: head atraumatic, normocephalic, TM's WNL, no sinus pain on palpation, Pupils PERRLA, EOM's intact. OP shows mild erythema.  Neck lymphadenopathy present - pt states this is baseline secondary to CLL diagnosis. Cardiovascular: Normal rate, regular rhythm, S1/S2 present.  No murmur or rub heard. No BLE edema. Pulmonary/Chest: Effort normal and breath sounds find mild wheezing throughout, slightly diminished on the RLL. No respiratory distress or retractions, speaking in complete sentences with ease, coughing intermittently during exam. Abdominal: Soft and non-tender, bowel sounds present x4 quadrants.  No CVA  tenderness Psychiatric: Patient has a normal mood and affect. behavior is normal. Judgment and thought content normal.  Recent Results (from the past 2160 hour(s))  CBC with Differential     Status: Abnormal   Collection Time: 11/26/16 10:15 AM  Result Value Ref Range   WBC 32.8 (H) 3.6 - 11.0 K/uL   RBC 4.06 3.80 - 5.20 MIL/uL   Hemoglobin 13.1 12.0 - 16.0 g/dL   HCT 39.2 35.0 - 47.0 %   MCV 96.6 80.0 - 100.0 fL   MCH 32.4 26.0 - 34.0 pg   MCHC 33.5 32.0 - 36.0 g/dL   RDW 13.1 11.5 - 14.5 %   Platelets 225 150 - 440 K/uL   Neutrophils Relative % 16 %   Neutro Abs 5.2 1.4 - 6.5 K/uL   Lymphocytes Relative 81 %   Lymphs Abs 26.6 (H) 1.0 - 3.6 K/uL   Monocytes Relative 3 %   Monocytes Absolute 0.8 0.2 - 0.9 K/uL   Eosinophils Relative 0 %   Eosinophils Absolute 0.1 0 - 0.7 K/uL   Basophils Relative 0 %   Basophils Absolute 0.1 0 - 0.1 K/uL  CBC with Differential     Status: Abnormal   Collection Time: 01/07/17  8:42 AM  Result Value Ref Range   WBC 35.6 (H) 3.6 - 11.0 K/uL   RBC 4.28 3.80 - 5.20 MIL/uL   Hemoglobin 13.9 12.0 - 16.0 g/dL   HCT 41.2 35.0 - 47.0 %   MCV 96.2 80.0 - 100.0 fL   MCH 32.6 26.0 - 34.0 pg   MCHC 33.8 32.0 - 36.0 g/dL   RDW 12.9 11.5 - 14.5 %   Platelets 183 150 - 440 K/uL   Neutrophils Relative % 9 %   Neutro Abs 3.3 1.4 - 6.5 K/uL   Lymphocytes Relative 89 %   Lymphs Abs 31.3 (H) 1.0 - 3.6 K/uL   Monocytes Relative 2 %   Monocytes Absolute 0.7 0.2 - 0.9 K/uL   Eosinophils Relative 0 %   Eosinophils Absolute 0.1 0 - 0.7 K/uL   Basophils Relative 0 %   Basophils Absolute 0.1 0 - 0.1 K/uL  Hepatitis panel, acute     Status: None   Collection Time: 01/22/17 12:40 PM  Result Value Ref Range   Hepatitis B Surface Ag Negative Negative   HCV Ab <0.1 0.0 - 0.9 s/co ratio    Comment: (NOTE)                                  Negative:     < 0.8  Indeterminate: 0.8 - 0.9                                  Positive:     >  0.9 The CDC recommends that a positive HCV antibody result be followed up with a HCV Nucleic Acid Amplification test ([id number]). Performed At: [facility] [place], [place] [id number] Lindon Romp MD QZ:[id number]    Hep A IgM Negative Negative   Hep B C IgM Negative Negative  Hepatitis B surface antigen     Status: None   Collection Time: 01/22/17 12:40 PM  Result Value Ref Range   Hepatitis B Surface Ag Negative Negative    Comment: (NOTE) Performed At: [facility] [place], [place] [id number] Lindon Romp MD GY:[id number]   CBC with Differential/Platelet     Status: Abnormal   Collection Time: 01/30/17  8:31 AM  Result Value Ref Range   WBC 32.5 (H) 3.6 - 11.0 K/uL   RBC 4.15 3.80 - 5.20 MIL/uL   Hemoglobin 13.7 12.0 - 16.0 g/dL   HCT 40.1 35.0 - 47.0 %   MCV 96.8 80.0 - 100.0 fL   MCH 33.1 26.0 - 34.0 pg   MCHC 34.2 32.0 - 36.0 g/dL   RDW 13.2 11.5 - 14.5 %   Platelets 215 150 - 440 K/uL   Neutrophils Relative % 12 %   Neutro Abs 3.9 1.4 - 6.5 K/uL   Lymphocytes Relative 86 %   Lymphs Abs 27.7 (H) 1.0 - 3.6 K/uL   Monocytes Relative 2 %   Monocytes Absolute 0.8 0.2 - 0.9 K/uL   Eosinophils Relative 0 %   Eosinophils Absolute 0.1 0 - 0.7 K/uL   Basophils Relative 0 %   Basophils Absolute 0.1 0 - 0.1 K/uL  CBC with Differential/Platelet     Status: Abnormal   Collection Time: 02/06/17  8:30 AM  Result Value Ref Range   WBC 18.1 (H) 3.6 - 11.0 K/uL   RBC 4.05 3.80 - 5.20 MIL/uL   Hemoglobin 13.3 12.0 - 16.0 g/dL   HCT 39.5 35.0 - 47.0 %   MCV 97.6 80.0 - 100.0 fL   MCH 32.9 26.0 - 34.0 pg   MCHC 33.7 32.0 - 36.0 g/dL   RDW 13.0 11.5 - 14.5 %   Platelets 190 150 - 440 K/uL   Neutrophils Relative % 26 %   Neutro Abs 4.7 1.4 - 6.5 K/uL   Lymphocytes Relative 73 %   Lymphs Abs 13.0 (H) 1.0 - 3.6 K/uL   Monocytes Relative 1 %   Monocytes Absolute 0.2 0.2 - 0.9 K/uL   Eosinophils Relative 0 %   Eosinophils  Absolute 0.0 0 - 0.7 K/uL   Basophils Relative 0 %   Basophils Absolute 0.0 0 - 0.1 K/uL  CBC with Differential/Platelet     Status: Abnormal   Collection Time: 02/13/17  8:38 AM  Result Value Ref Range   WBC 43.7 (H) 3.6 - 11.0 K/uL   RBC 4.15 3.80 - 5.20 MIL/uL   Hemoglobin 13.7 12.0 - 16.0 g/dL   HCT 40.3 35.0 - 47.0 %   MCV 97.2 80.0 - 100.0 fL   MCH 33.1 26.0 - 34.0 pg   MCHC 34.0 32.0 - 36.0 g/dL   RDW 13.1 11.5 - 14.5 %   Platelets 303 150 - 440 K/uL   Neutrophils Relative % 23 %  Neutro Abs 10.2 (H) 1.4 - 6.5 K/uL   Lymphocytes Relative 74 %   Lymphs Abs 32.3 (H) 1.0 - 3.6 K/uL   Monocytes Relative 2 %   Monocytes Absolute 0.9 0.2 - 0.9 K/uL   Eosinophils Relative 0 %   Eosinophils Absolute 0.0 0 - 0.7 K/uL   Basophils Relative 1 %   Basophils Absolute 0.2 (H) 0 - 0.1 K/uL     Assessment & Plan  1. Community acquired pneumonia of right lower lobe of lung (HCC)  - azithromycin (ZITHROMAX) 250 MG tablet; Take 2 pills on day one and take 1 pill daily on days 2-5.  Dispense: 6 tablet; Refill: 0 - benzonatate (TESSALON) 100 MG capsule; Take 1 capsule (100 mg total) by mouth 2 (two) times daily as needed for cough.  Dispense: 20 capsule; Refill: 0 - albuterol (PROVENTIL HFA;VENTOLIN HFA) 108 (90 Base) MCG/ACT inhaler; Inhale 2 puffs into the lungs every 6 (six) hours as needed for wheezing or shortness of breath.  Dispense: 1 Inhaler; Refill: 2 -Discussed patient's immune system at length with her - she would like to try medication management as above before doing imaging and labs.  She will call in 2-3 days if not better and we will do a Chest Xray and possible labs. Advised that she call at any time if she is feeling worse. Red flags as below.  2. Anxiety, generalized  - DULoxetine (CYMBALTA) 30 MG capsule; Take 1 capsule (30 mg total) by mouth daily.  Dispense: 30 capsule; Refill: 0  3. Tobacco use - Written materials in AVS.  -Red flags and when to present for  emergency care or RTC including fever >101.78F, chest pain, shortness of breath, new/worsening/un-resolving symptoms  reviewed with patient at time of visit. Follow up and care instructions discussed and provided in AVS.  I have reviewed this encounter including the documentation in this note and/or discussed this patient with the Johney Maine, FNP, NP-C. I am certifying that I agree with the content of this note as supervising physician.  [name], MD [facility] 02/23/2017, 10:37 AM

## 2017-02-19 NOTE — Progress Notes (Signed)
[place]  [phone] [phone]  ID: Monique [name] OB: Jan 14, 1971  MR#: [id number]  BDZ#:[id number]  Patient Care Team: [name], MD as PCP - General (Family Medicine)  CHIEF COMPLAINT: CLL.  INTERVAL HISTORY: Patient returns to clinic today for further evaluation and initiation of cycle 4 of 4 of weekly Rituxan. She continues to be highly anxious, but otherwise feels well. She has no neurologic complaints. She has increased cough and congestion and was recently placed on antibiotics. She denies any recent fevers, illnesses, or weight loss. She has noted no new lymphadenopathy. She has no chest pain or shortness of breath. She denies any nausea, vomiting, constipation, or diarrhea. She has no urinary complaints. Patient offers no further specific complaints today.  REVIEW OF SYSTEMS:   Review of Systems  Constitutional: Negative.  Negative for fever, malaise/fatigue and weight loss.  HENT: Positive for congestion.   Respiratory: Positive for cough. Negative for shortness of breath.   Cardiovascular: Negative.  Negative for chest pain and leg swelling.  Gastrointestinal: Negative.  Negative for abdominal pain.  Genitourinary: Negative.   Musculoskeletal: Negative.   Skin: Negative.  Negative for rash.  Neurological: Negative.  Negative for sensory change and weakness.  Psychiatric/Behavioral: The patient is nervous/anxious.     As per HPI. Otherwise, a complete review of systems is negative.  PAST MEDICAL HISTORY: Past Medical History:  Diagnosis Date  . Anxiety   . Cervical cancer (HCC)    hx LEEP  . Cervical intraepithelial neoplasia I   . Depression   . Eating disorder   . History of self-harm   . Insomnia   . Obsession   . Pap smear abnormality of cervix with LGSIL   . Tobacco abuse   . Vitamin B12 deficiency (non anemic)     PAST SURGICAL HISTORY: Past Surgical History:  Procedure Laterality Date  . CERVICAL  BIOPSY  W/ LOOP ELECTRODE EXCISION    . TUBAL LIGATION      FAMILY HISTORY: Family History  Problem Relation Age of Onset  . Cancer Mother        thyroid  . Alcohol abuse Father   . Depression Sister   . Cancer Sister        cevical  . Alcohol abuse Brother   . Depression Brother   . Bipolar disorder Brother   . ADD / ADHD Son     ADVANCED DIRECTIVES (Y/N):  N  HEALTH MAINTENANCE: Social History  Substance Use Topics  . Smoking status: Current Every Day Smoker    Packs/day: 1.00    Years: 30.00    Start date: 09/25/1986  . Smokeless tobacco: Never Used  . Alcohol use 0.0 oz/week     Comment: rarely     Colonoscopy:  PAP:  Bone density:  Lipid panel:  No Known Allergies  Current Outpatient Prescriptions  Medication Sig Dispense Refill  . albuterol (PROVENTIL HFA;VENTOLIN HFA) 108 (90 Base) MCG/ACT inhaler Inhale 2 puffs into the lungs every 6 (six) hours as needed for wheezing or shortness of breath. 1 Inhaler 2  . azithromycin (ZITHROMAX) 250 MG tablet Take 2 pills on day one and take 1 pill daily on days 2-5. 6 tablet 0  . benzonatate (TESSALON) 100 MG capsule Take 1 capsule (100 mg total) by mouth 2 (two) times daily as needed for cough. 20 capsule 0  . cyclobenzaprine (FLEXERIL) 10 MG tablet Take 1 tablet by mouth 2 (two) times daily.    . DULoxetine (  CYMBALTA) 30 MG capsule Take 1 capsule (30 mg total) by mouth daily. 30 capsule 0  . HYDROcodone-acetaminophen (NORCO/VICODIN) 5-325 MG tablet Take 1 tablet by mouth as needed.    . meloxicam (MOBIC) 15 MG tablet     . omeprazole (PRILOSEC) 40 MG capsule     . traMADol (ULTRAM) 50 MG tablet Take 50 mg by mouth every 6 (six) hours as needed.   1  . predniSONE (DELTASONE) 10 MG tablet Take 1 tablet (10 mg total) by mouth daily with breakfast. 6 on day 1, 5 on day 2, 4 on day 3, 3 on day 4, 2 on day 5, 1 on day 6. 21 tablet 0   No current facility-administered medications for this visit.     OBJECTIVE: Vitals:    02/20/17 0839  BP: 111/72  Pulse: 80  Resp: 18  Temp: 97.2 F (36.2 C)     Body mass index is 23.16 kg/m.    ECOG FS:0 - Asymptomatic  General: Well-developed, well-nourished, no acute distress. Eyes: Pink conjunctiva, anicteric sclera. Lungs: Clear to auscultation bilaterally. Heart: Regular rate and rhythm. No rubs, murmurs, or gallops. Abdomen: Soft, nontender, nondistended. No organomegaly noted, normoactive bowel sounds. Musculoskeletal: No edema, cyanosis, or clubbing. Neuro: Alert, answering all questions appropriately. Cranial nerves grossly intact. Skin: No rashes or petechiae noted. Psych: Normal affect. Lymphatics: Minimally palpable bilateral cervical lymphadenopathy. No other lymphadenopathy palpated.  LAB RESULTS:  Lab Results  Component Value Date   NA 138 09/23/2016   K 4.4 09/23/2016   CL 107 09/23/2016   CO2 26 09/23/2016   GLUCOSE 68 09/23/2016   BUN 18 09/23/2016   CREATININE 0.80 09/23/2016   CALCIUM 8.6 (L) 09/23/2016   PROT 6.6 09/23/2016   ALBUMIN 3.9 09/23/2016   AST 22 09/23/2016   ALT 16 09/23/2016   ALKPHOS 49 09/23/2016   BILITOT 0.5 09/23/2016   GFRNONAA >60 09/23/2016   GFRAA >60 09/23/2016    Lab Results  Component Value Date   WBC 17.7 (H) 02/20/2017   NEUTROABS 5.3 02/20/2017   HGB 14.0 02/20/2017   HCT 41.1 02/20/2017   MCV 96.8 02/20/2017   PLT 297 02/20/2017     STUDIES: Dg Chest 2 View  Result Date: 02/20/2017 CLINICAL DATA:  Cough and congestion with recent pneumonia. History of chronic lymphocytic leukemia EXAM: CHEST  2 VIEW COMPARISON:  Chest radiograph September 23, 2016 and chest CT Jan 21, 2017 FINDINGS: There is no edema or consolidation. Heart size and pulmonary vascular normal. The previously noted adenopathy seen on CT examination is not appreciable by chest radiography. No bone lesions. IMPRESSION: No evident edema or consolidation. Adenopathy noted on recent chest CT is not appreciable by radiography. Electronically  Signed   By: Lowella Grip III M.D.   On: 02/20/2017 15:47    ASSESSMENT: CLL, Rai stage 1  PLAN:    1. CLL: Confirmed by peripheral blood flow cytometry. CT scan results reviewed independently with significant lymphadenopathy. Patient was also symptomatic with reported night sweats. After lengthy discussion with the patient, she has agreed to proceed with single agent Rituxan weekly 4. Proceed with cycle 4 of 4 of weekly Rituxan today. Return to clinic prior to a prescheduled vacation the week of August 13 for repeat imaging and further evaluation..  2. Lymphadenopathy: Secondary CLL. Patient does not require further ENT follow-up for biopsy.  3. Cough/congestion: Continue antibiotics as prescribed.  Approximately 30 minutes was spent in discussion of which greater than 50% was consultation.  Patient expressed understanding and was in agreement with this plan. She also understands that She can call clinic at any time with any questions, concerns, or complaints.    Lloyd Huger, MD   02/25/2017 6:10 PM

## 2017-02-20 ENCOUNTER — Ambulatory Visit
Admission: RE | Admit: 2017-02-20 | Discharge: 2017-02-20 | Disposition: A | Discharge status: Home / Self Care | Payer: BLUE CROSS/BLUE SHIELD | Source: Ambulatory Visit | Attending: Family Medicine | Admitting: Family Medicine

## 2017-02-20 ENCOUNTER — Inpatient Hospital Stay: Payer: BLUE CROSS/BLUE SHIELD | Attending: Oncology

## 2017-02-20 ENCOUNTER — Encounter: Payer: Self-pay | Admitting: Family Medicine

## 2017-02-20 ENCOUNTER — Ambulatory Visit (INDEPENDENT_AMBULATORY_CARE_PROVIDER_SITE_OTHER): Payer: BLUE CROSS/BLUE SHIELD | Admitting: Family Medicine

## 2017-02-20 ENCOUNTER — Inpatient Hospital Stay (HOSPITAL_BASED_OUTPATIENT_CLINIC_OR_DEPARTMENT_OTHER): Payer: BLUE CROSS/BLUE SHIELD | Admitting: Oncology

## 2017-02-20 ENCOUNTER — Inpatient Hospital Stay: Payer: BLUE CROSS/BLUE SHIELD

## 2017-02-20 VITALS — BP 110/70 | HR 89 | Temp 97.7°F | Resp 18 | Ht 67.0 in | Wt 149.3 lb

## 2017-02-20 VITALS — BP 111/72 | HR 80 | Temp 97.2°F | Resp 18 | Wt 147.9 lb

## 2017-02-20 DIAGNOSIS — R591 Generalized enlarged lymph nodes: Secondary | ICD-10-CM | POA: Diagnosis not present

## 2017-02-20 DIAGNOSIS — R05 Cough: Secondary | ICD-10-CM

## 2017-02-20 DIAGNOSIS — G4700 Insomnia, unspecified: Secondary | ICD-10-CM

## 2017-02-20 DIAGNOSIS — J189 Pneumonia, unspecified organism: Secondary | ICD-10-CM

## 2017-02-20 DIAGNOSIS — C9110 Chronic lymphocytic leukemia of B-cell type not having achieved remission: Secondary | ICD-10-CM

## 2017-02-20 DIAGNOSIS — Z85850 Personal history of malignant neoplasm of thyroid: Secondary | ICD-10-CM | POA: Diagnosis not present

## 2017-02-20 DIAGNOSIS — F419 Anxiety disorder, unspecified: Secondary | ICD-10-CM

## 2017-02-20 DIAGNOSIS — R059 Cough, unspecified: Secondary | ICD-10-CM

## 2017-02-20 DIAGNOSIS — Z8541 Personal history of malignant neoplasm of cervix uteri: Secondary | ICD-10-CM

## 2017-02-20 DIAGNOSIS — C9190 Lymphoid leukemia, unspecified not having achieved remission: Secondary | ICD-10-CM

## 2017-02-20 DIAGNOSIS — F17210 Nicotine dependence, cigarettes, uncomplicated: Secondary | ICD-10-CM

## 2017-02-20 DIAGNOSIS — Z79899 Other long term (current) drug therapy: Secondary | ICD-10-CM | POA: Diagnosis not present

## 2017-02-20 DIAGNOSIS — E538 Deficiency of other specified B group vitamins: Secondary | ICD-10-CM

## 2017-02-20 DIAGNOSIS — J9801 Acute bronchospasm: Secondary | ICD-10-CM

## 2017-02-20 DIAGNOSIS — F329 Major depressive disorder, single episode, unspecified: Secondary | ICD-10-CM | POA: Diagnosis not present

## 2017-02-20 DIAGNOSIS — Z87898 Personal history of other specified conditions: Secondary | ICD-10-CM

## 2017-02-20 DIAGNOSIS — J181 Lobar pneumonia, unspecified organism: Secondary | ICD-10-CM | POA: Diagnosis not present

## 2017-02-20 DIAGNOSIS — Z8701 Personal history of pneumonia (recurrent): Secondary | ICD-10-CM | POA: Insufficient documentation

## 2017-02-20 DIAGNOSIS — R0602 Shortness of breath: Secondary | ICD-10-CM | POA: Diagnosis not present

## 2017-02-20 LAB — CBC WITH DIFFERENTIAL/PLATELET
BASOS ABS: 0.1 10*3/uL (ref 0–0.1)
BASOS PCT: 0 %
EOS ABS: 0 10*3/uL (ref 0–0.7)
EOS PCT: 0 %
HCT: 41.1 % (ref 35.0–47.0)
Hemoglobin: 14 g/dL (ref 12.0–16.0)
Lymphocytes Relative: 68 %
Lymphs Abs: 12.1 10*3/uL — ABNORMAL HIGH (ref 1.0–3.6)
MCH: 33 pg (ref 26.0–34.0)
MCHC: 34.1 g/dL (ref 32.0–36.0)
MCV: 96.8 fL (ref 80.0–100.0)
Monocytes Absolute: 0.3 10*3/uL (ref 0.2–0.9)
Monocytes Relative: 2 %
Neutro Abs: 5.3 10*3/uL (ref 1.4–6.5)
Neutrophils Relative %: 30 %
PLATELETS: 297 10*3/uL (ref 150–440)
RBC: 4.24 MIL/uL (ref 3.80–5.20)
RDW: 13.2 % (ref 11.5–14.5)
WBC: 17.7 10*3/uL — AB (ref 3.6–11.0)

## 2017-02-20 LAB — D-DIMER, QUANTITATIVE: D-Dimer, Quant: 0.34 mcg/mL FEU (ref <0.50)

## 2017-02-20 MED ORDER — ACETAMINOPHEN 325 MG PO TABS
650.0000 mg | ORAL_TABLET | Freq: Once | ORAL | Status: AC
  Administered 2017-02-20: 650 mg via ORAL
  Filled 2017-02-20: qty 2

## 2017-02-20 MED ORDER — RITUXIMAB CHEMO INJECTION 500 MG/50ML
375.0000 mg/m2 | Freq: Once | INTRAVENOUS | Status: AC
Start: 2017-02-20 — End: 2017-02-20
  Administered 2017-02-20: 700 mg via INTRAVENOUS
  Filled 2017-02-20: qty 50

## 2017-02-20 MED ORDER — SODIUM CHLORIDE 0.9 % IV SOLN: 375.0000 mg/m2 | Freq: Once | INTRAVENOUS | Status: DC

## 2017-02-20 MED ORDER — PREDNISONE 10 MG PO TABS: 10.0000 mg | ORAL_TABLET | Freq: Every day | ORAL | 0 refills | Status: DC

## 2017-02-20 MED ORDER — SODIUM CHLORIDE 0.9 % IV SOLN
Freq: Once | INTRAVENOUS | Status: AC
  Administered 2017-02-20: 10:00:00 via INTRAVENOUS
  Filled 2017-02-20: qty 1000

## 2017-02-20 MED ORDER — DIPHENHYDRAMINE HCL 25 MG PO CAPS
25.0000 mg | ORAL_CAPSULE | Freq: Once | ORAL | Status: AC
  Administered 2017-02-20: 25 mg via ORAL
  Filled 2017-02-20: qty 1

## 2017-02-20 NOTE — Progress Notes (Signed)
Patient is here today for follow up, she mentions she was just diagnosed with pneumonia.

## 2017-02-20 NOTE — Progress Notes (Signed)
Please also let pt know that her D-dimer was negative. Thank you!

## 2017-02-20 NOTE — Progress Notes (Addendum)
Name: Monique [name]   MRN: [id number]    DOB: [birth year]   Date:02/20/2017       Progress Note  Subjective  Chief Complaint  Chief Complaint  Patient presents with  . Follow-up    not feeling any better  . Cough    much worse at night, cannot sleep    HPI  Pt presents to follow up on CAP, she is not feeling any better, but was able to go for her treatment at the cancer center today. She endorses some shortness of breath, coughing with bronchospasm, some pain in her upper back when coughing, but no chest pain, no fevers/chills or NVD, abdominal pain.  Has been using inhaler but it doesn't seem to help. Notes she has been taking it with her other medications - discussed using it PRN during cough/bronchospasm.  WBC's are chronically elevated due to diagnosis of CLL, and she had CBC done today which shows WBC's 17.7, normal Neutrophils at 5.3, and chronically elevated Lymphocytes at 12.1. Each of these labs are improved from 7 days ago - see results for details.  Has been taking Azithromycin as prescribed.  Patient Active Problem List   Diagnosis Date Noted  . GERD without esophagitis 12/11/2016  . CLL (chronic lymphocytic leukemia) ([place]) 11/24/2016  . Pap smear abnormality of cervix/human papillomavirus (HPV) positive 09/12/2016  . Stress incontinence 09/04/2016  . B12 deficiency 07/10/2015  . Insomnia, persistent 07/10/2015  . Mild episode of recurrent major depressive disorder ([place]) 07/10/2015  . Anorexia nervosa, restricting type 07/10/2015  . Anxiety, generalized 07/10/2015  . H/O suicide attempt 07/10/2015  . Lymphocytosis 07/10/2015  . Obsessive-compulsive disorder 07/10/2015  . Tobacco use 07/10/2015  . History of cervical dysplasia 07/18/2014    Social History  Substance Use Topics  . Smoking status: Current Every Day Smoker    Packs/day: 1.00    Years: 30.00    Start date: 09/25/1986  . Smokeless tobacco: Never Used  . Alcohol use 0.0 oz/week     Comment: rarely      Current Outpatient Prescriptions:  .  albuterol (PROVENTIL HFA;VENTOLIN HFA) 108 (90 Base) MCG/ACT inhaler, Inhale 2 puffs into the lungs every 6 (six) hours as needed for wheezing or shortness of breath., Disp: 1 Inhaler, Rfl: 2 .  azithromycin (ZITHROMAX) 250 MG tablet, Take 2 pills on day one and take 1 pill daily on days 2-5., Disp: 6 tablet, Rfl: 0 .  benzonatate (TESSALON) 100 MG capsule, Take 1 capsule (100 mg total) by mouth 2 (two) times daily as needed for cough., Disp: 20 capsule, Rfl: 0 .  cyclobenzaprine (FLEXERIL) 10 MG tablet, Take 1 tablet by mouth 2 (two) times daily., Disp: , Rfl:  .  DULoxetine (CYMBALTA) 30 MG capsule, Take 1 capsule (30 mg total) by mouth daily., Disp: 30 capsule, Rfl: 0 .  HYDROcodone-acetaminophen (NORCO/VICODIN) 5-325 MG tablet, Take 1 tablet by mouth as needed., Disp: , Rfl:  .  meloxicam (MOBIC) 15 MG tablet, , Disp: , Rfl:  .  omeprazole (PRILOSEC) 40 MG capsule, , Disp: , Rfl:  .  traMADol (ULTRAM) 50 MG tablet, Take 50 mg by mouth every 6 (six) hours as needed. , Disp: , Rfl: 1  No Known Allergies  ROS  Ten systems reviewed and is negative except as mentioned in HPI.  Objective  Vitals:   02/20/17 1437  BP: 110/70  Pulse: 89  Resp: 18  Temp: 97.7 F (36.5 C)  TempSrc: Oral  SpO2: 96%  Weight: 149  lb 4.8 oz (67.7 kg)  Height: 5\' 7"  (1.702 m)    Body mass index is 23.38 kg/m.  Nursing Note and Vital Signs reviewed.  Physical Exam  Constitutional: Patient appears well-developed and well-nourished. Thin, No distress.  HEENT: head atraumatic, normocephalic, neck supple with lymphadenopathy (chronic for pt), oropharynx pink and moist without exudate Cardiovascular: Normal rate, regular rhythm, S1/S2 present.  No murmur or rub heard. No BLE edema. Pulmonary/Chest: Effort normal and breath sounds mild expiratory wheeze to LLL, LUL, and RUL, RLL is clear. No respiratory distress or retractions. Pt is coughing intermittently during  exam. Pt ambulatory in hallway prior to examination without shortness of breath. SpO2 stable at 96%. Abdominal: Soft and non-tender, bowel sounds present x4 quadrants. Psychiatric: Patient has a normal mood and affect. behavior is normal. Judgment and thought content normal.  Recent Results (from the past 2160 hour(s))  CBC with Differential     Status: Abnormal   Collection Time: 11/26/16 10:15 AM  Result Value Ref Range   WBC 32.8 (H) 3.6 - 11.0 K/uL   RBC 4.06 3.80 - 5.20 MIL/uL   Hemoglobin 13.1 12.0 - 16.0 g/dL   HCT 39.2 35.0 - 47.0 %   MCV 96.6 80.0 - 100.0 fL   MCH 32.4 26.0 - 34.0 pg   MCHC 33.5 32.0 - 36.0 g/dL   RDW 13.1 11.5 - 14.5 %   Platelets 225 150 - 440 K/uL   Neutrophils Relative % 16 %   Neutro Abs 5.2 1.4 - 6.5 K/uL   Lymphocytes Relative 81 %   Lymphs Abs 26.6 (H) 1.0 - 3.6 K/uL   Monocytes Relative 3 %   Monocytes Absolute 0.8 0.2 - 0.9 K/uL   Eosinophils Relative 0 %   Eosinophils Absolute 0.1 0 - 0.7 K/uL   Basophils Relative 0 %   Basophils Absolute 0.1 0 - 0.1 K/uL  CBC with Differential     Status: Abnormal   Collection Time: 01/07/17  8:42 AM  Result Value Ref Range   WBC 35.6 (H) 3.6 - 11.0 K/uL   RBC 4.28 3.80 - 5.20 MIL/uL   Hemoglobin 13.9 12.0 - 16.0 g/dL   HCT 41.2 35.0 - 47.0 %   MCV 96.2 80.0 - 100.0 fL   MCH 32.6 26.0 - 34.0 pg   MCHC 33.8 32.0 - 36.0 g/dL   RDW 12.9 11.5 - 14.5 %   Platelets 183 150 - 440 K/uL   Neutrophils Relative % 9 %   Neutro Abs 3.3 1.4 - 6.5 K/uL   Lymphocytes Relative 89 %   Lymphs Abs 31.3 (H) 1.0 - 3.6 K/uL   Monocytes Relative 2 %   Monocytes Absolute 0.7 0.2 - 0.9 K/uL   Eosinophils Relative 0 %   Eosinophils Absolute 0.1 0 - 0.7 K/uL   Basophils Relative 0 %   Basophils Absolute 0.1 0 - 0.1 K/uL  Hepatitis panel, acute     Status: None   Collection Time: 01/22/17 12:40 PM  Result Value Ref Range   Hepatitis B Surface Ag Negative Negative   HCV Ab <0.1 0.0 - 0.9 s/co ratio    Comment: (NOTE)                                   Negative:     < 0.8  Indeterminate: 0.8 - 0.9                                  Positive:     > 0.9 The CDC recommends that a positive HCV antibody result be followed up with a HCV Nucleic Acid Amplification test (062694). Performed At: [facility] [place], [place] [id number] Lindon Romp MD [id number]    Hep A IgM Negative Negative   Hep B C IgM Negative Negative  Hepatitis B surface antigen     Status: None   Collection Time: 01/22/17 12:40 PM  Result Value Ref Range   Hepatitis B Surface Ag Negative Negative    Comment: (NOTE) Performed At: [facility] [place], [place] [id number] Lindon Romp MD [id number]   CBC with Differential/Platelet     Status: Abnormal   Collection Time: 01/30/17  8:31 AM  Result Value Ref Range   WBC 32.5 (H) 3.6 - 11.0 K/uL   RBC 4.15 3.80 - 5.20 MIL/uL   Hemoglobin 13.7 12.0 - 16.0 g/dL   HCT 40.1 35.0 - 47.0 %   MCV 96.8 80.0 - 100.0 fL   MCH 33.1 26.0 - 34.0 pg   MCHC 34.2 32.0 - 36.0 g/dL   RDW 13.2 11.5 - 14.5 %   Platelets 215 150 - 440 K/uL   Neutrophils Relative % 12 %   Neutro Abs 3.9 1.4 - 6.5 K/uL   Lymphocytes Relative 86 %   Lymphs Abs 27.7 (H) 1.0 - 3.6 K/uL   Monocytes Relative 2 %   Monocytes Absolute 0.8 0.2 - 0.9 K/uL   Eosinophils Relative 0 %   Eosinophils Absolute 0.1 0 - 0.7 K/uL   Basophils Relative 0 %   Basophils Absolute 0.1 0 - 0.1 K/uL  CBC with Differential/Platelet     Status: Abnormal   Collection Time: 02/06/17  8:30 AM  Result Value Ref Range   WBC 18.1 (H) 3.6 - 11.0 K/uL   RBC 4.05 3.80 - 5.20 MIL/uL   Hemoglobin 13.3 12.0 - 16.0 g/dL   HCT 39.5 35.0 - 47.0 %   MCV 97.6 80.0 - 100.0 fL   MCH 32.9 26.0 - 34.0 pg   MCHC 33.7 32.0 - 36.0 g/dL   RDW 13.0 11.5 - 14.5 %   Platelets 190 150 - 440 K/uL   Neutrophils Relative % 26 %   Neutro Abs 4.7 1.4 - 6.5 K/uL   Lymphocytes Relative 73 %    Lymphs Abs 13.0 (H) 1.0 - 3.6 K/uL   Monocytes Relative 1 %   Monocytes Absolute 0.2 0.2 - 0.9 K/uL   Eosinophils Relative 0 %   Eosinophils Absolute 0.0 0 - 0.7 K/uL   Basophils Relative 0 %   Basophils Absolute 0.0 0 - 0.1 K/uL  CBC with Differential/Platelet     Status: Abnormal   Collection Time: 02/13/17  8:38 AM  Result Value Ref Range   WBC 43.7 (H) 3.6 - 11.0 K/uL   RBC 4.15 3.80 - 5.20 MIL/uL   Hemoglobin 13.7 12.0 - 16.0 g/dL   HCT 40.3 35.0 - 47.0 %   MCV 97.2 80.0 - 100.0 fL   MCH 33.1 26.0 - 34.0 pg   MCHC 34.0 32.0 - 36.0 g/dL   RDW 13.1 11.5 - 14.5 %   Platelets 303 150 - 440 K/uL   Neutrophils Relative % 23 %  Neutro Abs 10.2 (H) 1.4 - 6.5 K/uL   Lymphocytes Relative 74 %   Lymphs Abs 32.3 (H) 1.0 - 3.6 K/uL   Monocytes Relative 2 %   Monocytes Absolute 0.9 0.2 - 0.9 K/uL   Eosinophils Relative 0 %   Eosinophils Absolute 0.0 0 - 0.7 K/uL   Basophils Relative 1 %   Basophils Absolute 0.2 (H) 0 - 0.1 K/uL  CBC with Differential/Platelet     Status: Abnormal   Collection Time: 02/20/17  8:08 AM  Result Value Ref Range   WBC 17.7 (H) 3.6 - 11.0 K/uL   RBC 4.24 3.80 - 5.20 MIL/uL   Hemoglobin 14.0 12.0 - 16.0 g/dL   HCT 41.1 35.0 - 47.0 %   MCV 96.8 80.0 - 100.0 fL   MCH 33.0 26.0 - 34.0 pg   MCHC 34.1 32.0 - 36.0 g/dL   RDW 13.2 11.5 - 14.5 %   Platelets 297 150 - 440 K/uL   Neutrophils Relative % 30 %   Neutro Abs 5.3 1.4 - 6.5 K/uL   Lymphocytes Relative 68 %   Lymphs Abs 12.1 (H) 1.0 - 3.6 K/uL   Monocytes Relative 2 %   Monocytes Absolute 0.3 0.2 - 0.9 K/uL   Eosinophils Relative 0 %   Eosinophils Absolute 0.0 0 - 0.7 K/uL   Basophils Relative 0 %   Basophils Absolute 0.1 0 - 0.1 K/uL     Assessment & Plan  1. Community acquired pneumonia of right lower lobe of lung ([place]) - DG Chest 2 View; Future - If positive for pneumonia, we will add Amoxicillin 1g Q8H x7 days. - predniSONE (DELTASONE) 10 MG tablet; Take 1 tablet (10 mg total) by mouth  daily with breakfast. 6 on day 1, 5 on day 2, 4 on day 3, 3 on day 4, 2 on day 5, 1 on day 6.  Dispense: 21 tablet; Refill: 0  2. Cough - DG Chest 2 View; Future - D-Dimer, Quantitative  3. Bronchospasm - DG Chest 2 View; Future - predniSONE (DELTASONE) 10 MG tablet; Take 1 tablet (10 mg total) by mouth daily with breakfast. 6 on day 1, 5 on day 2, 4 on day 3, 3 on day 4, 2 on day 5, 1 on day 6.  Dispense: 21 tablet; Refill: 0  4. Shortness of breath - D-Dimer, Quantitative  -Red flags and when to present for emergency care or RTC including fever >101.71F, chest pain, shortness of breath, new/worsening/un-resolving symptoms, vomiting, or malaise reviewed with patient at time of visit. Follow up and care instructions discussed and provided in AVS.  --------------------------- I have reviewed this encounter including the documentation in this note and/or discussed this patient with the Johney Maine, FNP, NP-C. I am certifying that I agree with the content of this note as supervising physician.  Enid Derry, [facility] 02/20/2017, 5:30 PM

## 2017-02-26 ENCOUNTER — Ambulatory Visit: Payer: BLUE CROSS/BLUE SHIELD | Admitting: Oncology

## 2017-02-26 ENCOUNTER — Other Ambulatory Visit: Payer: BLUE CROSS/BLUE SHIELD

## 2017-03-17 ENCOUNTER — Other Ambulatory Visit: Payer: Self-pay | Admitting: Family Medicine

## 2017-03-17 ENCOUNTER — Encounter: Payer: Self-pay | Admitting: Family Medicine

## 2017-03-17 DIAGNOSIS — F411 Generalized anxiety disorder: Secondary | ICD-10-CM

## 2017-03-17 NOTE — Telephone Encounter (Signed)
PT not due for refill until 03/20/17, and she has a follow up with PCP Dr. Ancil Boozer on that date, refills will be provided during visit.

## 2017-03-20 ENCOUNTER — Encounter: Payer: Self-pay | Admitting: Family Medicine

## 2017-03-20 ENCOUNTER — Ambulatory Visit (INDEPENDENT_AMBULATORY_CARE_PROVIDER_SITE_OTHER): Payer: BLUE CROSS/BLUE SHIELD | Admitting: Family Medicine

## 2017-03-20 VITALS — BP 110/68 | HR 104 | Temp 98.0°F | Resp 18 | Ht 67.0 in | Wt 149.2 lb

## 2017-03-20 DIAGNOSIS — F330 Major depressive disorder, recurrent, mild: Secondary | ICD-10-CM

## 2017-03-20 DIAGNOSIS — F5001 Anorexia nervosa, restricting type: Secondary | ICD-10-CM

## 2017-03-20 DIAGNOSIS — E559 Vitamin D deficiency, unspecified: Secondary | ICD-10-CM

## 2017-03-20 DIAGNOSIS — C9190 Lymphoid leukemia, unspecified not having achieved remission: Secondary | ICD-10-CM | POA: Diagnosis not present

## 2017-03-20 DIAGNOSIS — C9110 Chronic lymphocytic leukemia of B-cell type not having achieved remission: Secondary | ICD-10-CM

## 2017-03-20 DIAGNOSIS — K219 Gastro-esophageal reflux disease without esophagitis: Secondary | ICD-10-CM | POA: Diagnosis not present

## 2017-03-20 DIAGNOSIS — F50019 Anorexia nervosa, restricting type, unspecified: Secondary | ICD-10-CM

## 2017-03-20 DIAGNOSIS — F411 Generalized anxiety disorder: Secondary | ICD-10-CM | POA: Diagnosis not present

## 2017-03-20 DIAGNOSIS — E538 Deficiency of other specified B group vitamins: Secondary | ICD-10-CM | POA: Diagnosis not present

## 2017-03-20 NOTE — Progress Notes (Signed)
Name: Monique [name]   MRN: [id number]    DOB: 05/02/1971   Date:03/20/2017       Progress Note  Subjective  Chief Complaint  Chief Complaint  Patient presents with  . Depression    3 month follow up no issues recently  . Shoulder Pain    had some shoulder pain she thinks it was driving to [place] over the weekend    HPI  Major Depression/GAD/Anorexia: she has stopped taking Fluvoxamine months ago. She still has  having crying spells, she states when very nervous she is unable to talk - makes her very hoarse. She has anhedonia. She is worried about her daughter and 3 grandchildren moving back home, also raising a 61 yo son, stressed financially ( currently providing for two grown daughters and 3 grandchildren) She denies suicidal thoughts or ideation . She stopped seeing therapist because of the chaos in her life right now. Explained it is important to go back. She stopped Cymbalta on her own, does not want to take medication. Still has a lot of stress at home, but coping better.   CLL: WBC very high, seen by [name] had 4 rounds of treatment and is going to have repeat scan 04/28/2017  Abnormal pap smear: had colposcopy by GYN ( [facility] - Dr Marcelline Mates ) , last pap was ASCUS and positive HPV, seen back Dec 2017  B12 deficiency: needs to resume supplementation   GERD:  Seen by ENT in the past for hoarseness. Advised to take Omeprazole, but hoarseness did not improve, so because of possible side effects she stopped taking medication, no heartburn or regurgitation.   Anorexia:  She only eats one meal daily when restricting intake, it goes in spurts to keep weight around 140's,. Usually episodes lasts one week or two. She drinks coffee instead of eating at times.   Shoulder pain: secondary to travelling and sleeping inside the car, she had left shoulder pain, but is doing well now, pain is 0/10 and no concerns at this time  Patient Active Problem List   Diagnosis Date Noted  .  GERD without esophagitis 12/11/2016  . CLL (chronic lymphocytic leukemia) ([place]) 11/24/2016  . Pap smear abnormality of cervix/human papillomavirus (HPV) positive 09/12/2016  . Stress incontinence 09/04/2016  . B12 deficiency 07/10/2015  . Insomnia, persistent 07/10/2015  . Mild episode of recurrent major depressive disorder ([place]) 07/10/2015  . Anorexia nervosa, restricting type 07/10/2015  . Anxiety, generalized 07/10/2015  . H/O suicide attempt 07/10/2015  . Lymphocytosis 07/10/2015  . Obsessive-compulsive disorder 07/10/2015  . Tobacco use 07/10/2015  . History of cervical dysplasia 07/18/2014    Past Surgical History:  Procedure Laterality Date  . CERVICAL BIOPSY  W/ LOOP ELECTRODE EXCISION    . TUBAL LIGATION      Family History  Problem Relation Age of Onset  . Cancer Mother        thyroid  . Alcohol abuse Father   . Depression Sister   . Cancer Sister        cevical  . Alcohol abuse Brother   . Depression Brother   . Bipolar disorder Brother   . ADD / ADHD Son     Social History   Social History  . Marital status: Divorced    Spouse name: N/A  . Number of children: N/A  . Years of education: N/A   Occupational History  . Not on file.   Social History Main Topics  . Smoking status: Current Every  Day Smoker    Packs/day: 1.00    Years: 30.00    Start date: 09/25/1986  . Smokeless tobacco: Never Used  . Alcohol use 0.0 oz/week     Comment: rarely  . Drug use: No  . Sexual activity: Yes    Partners: Male    Birth control/ protection: None   Other Topics Concern  . Not on file   Social History Narrative  . No narrative on file     Current Outpatient Prescriptions:  .  albuterol (PROVENTIL HFA;VENTOLIN HFA) 108 (90 Base) MCG/ACT inhaler, Inhale 2 puffs into the lungs every 6 (six) hours as needed for wheezing or shortness of breath., Disp: 1 Inhaler, Rfl: 2 .  cyclobenzaprine (FLEXERIL) 10 MG tablet, Take 1 tablet by mouth 2 (two) times daily.,  Disp: , Rfl:  .  DULoxetine (CYMBALTA) 30 MG capsule, Take 1 capsule (30 mg total) by mouth daily., Disp: 30 capsule, Rfl: 0 .  HYDROcodone-acetaminophen (NORCO/VICODIN) 5-325 MG tablet, Take 1 tablet by mouth as needed., Disp: , Rfl:  .  meloxicam (MOBIC) 15 MG tablet, , Disp: , Rfl:  .  traMADol (ULTRAM) 50 MG tablet, Take 50 mg by mouth every 6 (six) hours as needed. , Disp: , Rfl: 1  No Known Allergies   ROS  Constitutional: Negative for fever or weight change.  Respiratory: Negative for cough and shortness of breath.   Cardiovascular: Negative for chest pain or palpitations.  Gastrointestinal: Negative for abdominal pain, no bowel changes.  Musculoskeletal: Negative for gait problem or joint swelling.  Skin: Negative for rash.  Neurological: Negative for dizziness or headache.  No other specific complaints in a complete review of systems (except as listed in HPI above).  Objective  Vitals:   03/20/17 0906  BP: 110/68  Pulse: (!) 104  Resp: 18  Temp: 98 F (36.7 C)  SpO2: 97%  Weight: 149 lb 3 oz (67.7 kg)  Height: 5\' 7"  (1.702 m)    Body mass index is 23.37 kg/m.  Physical Exam  Constitutional: Patient appears well-developed and well-nourished.  No distress.  HEENT: head atraumatic, normocephalic, pupils equal and reactive to light,  neck supple, throat within normal limits, left cervical lymphadenopathy, non-tender small in size Cardiovascular: Normal rate, regular rhythm and normal heart sounds.  No murmur heard. No BLE edema. Pulmonary/Chest: Effort normal and breath sounds normal. No respiratory distress. Abdominal: Soft.  There is no tenderness. Psychiatric: Patient has a normal mood and affect. behavior is normal. Judgment and thought content normal.  Recent Results (from the past 2160 hour(s))  CBC with Differential     Status: Abnormal   Collection Time: 01/07/17  8:42 AM  Result Value Ref Range   WBC 35.6 (H) 3.6 - 11.0 K/uL   RBC 4.28 3.80 - 5.20  MIL/uL   Hemoglobin 13.9 12.0 - 16.0 g/dL   HCT 41.2 35.0 - 47.0 %   MCV 96.2 80.0 - 100.0 fL   MCH 32.6 26.0 - 34.0 pg   MCHC 33.8 32.0 - 36.0 g/dL   RDW 12.9 11.5 - 14.5 %   Platelets 183 150 - 440 K/uL   Neutrophils Relative % 9 %   Neutro Abs 3.3 1.4 - 6.5 K/uL   Lymphocytes Relative 89 %   Lymphs Abs 31.3 (H) 1.0 - 3.6 K/uL   Monocytes Relative 2 %   Monocytes Absolute 0.7 0.2 - 0.9 K/uL   Eosinophils Relative 0 %   Eosinophils Absolute 0.1 0 - 0.7 K/uL  Basophils Relative 0 %   Basophils Absolute 0.1 0 - 0.1 K/uL  Hepatitis panel, acute     Status: None   Collection Time: 01/22/17 12:40 PM  Result Value Ref Range   Hepatitis B Surface Ag Negative Negative   HCV Ab <0.1 0.0 - 0.9 s/co ratio    Comment: (NOTE)                                  Negative:     < 0.8                             Indeterminate: 0.8 - 0.9                                  Positive:     > 0.9 The CDC recommends that a positive HCV antibody result be followed up with a HCV Nucleic Acid Amplification test ([id number]). Performed At: [facility], [place] [id number] Lindon Romp MD [id number]    Hep A IgM Negative Negative   Hep B C IgM Negative Negative  Hepatitis B surface antigen     Status: None   Collection Time: 01/22/17 12:40 PM  Result Value Ref Range   Hepatitis B Surface Ag Negative Negative    Comment: (NOTE) Performed At: [facility] [place], [place] [id number] Lindon Romp MD [id number]   CBC with Differential/Platelet     Status: Abnormal   Collection Time: 01/30/17  8:31 AM  Result Value Ref Range   WBC 32.5 (H) 3.6 - 11.0 K/uL   RBC 4.15 3.80 - 5.20 MIL/uL   Hemoglobin 13.7 12.0 - 16.0 g/dL   HCT 40.1 35.0 - 47.0 %   MCV 96.8 80.0 - 100.0 fL   MCH 33.1 26.0 - 34.0 pg   MCHC 34.2 32.0 - 36.0 g/dL   RDW 13.2 11.5 - 14.5 %   Platelets 215 150 - 440 K/uL   Neutrophils Relative % 12 %   Neutro Abs 3.9 1.4 -  6.5 K/uL   Lymphocytes Relative 86 %   Lymphs Abs 27.7 (H) 1.0 - 3.6 K/uL   Monocytes Relative 2 %   Monocytes Absolute 0.8 0.2 - 0.9 K/uL   Eosinophils Relative 0 %   Eosinophils Absolute 0.1 0 - 0.7 K/uL   Basophils Relative 0 %   Basophils Absolute 0.1 0 - 0.1 K/uL  CBC with Differential/Platelet     Status: Abnormal   Collection Time: 02/06/17  8:30 AM  Result Value Ref Range   WBC 18.1 (H) 3.6 - 11.0 K/uL   RBC 4.05 3.80 - 5.20 MIL/uL   Hemoglobin 13.3 12.0 - 16.0 g/dL   HCT 39.5 35.0 - 47.0 %   MCV 97.6 80.0 - 100.0 fL   MCH 32.9 26.0 - 34.0 pg   MCHC 33.7 32.0 - 36.0 g/dL   RDW 13.0 11.5 - 14.5 %   Platelets 190 150 - 440 K/uL   Neutrophils Relative % 26 %   Neutro Abs 4.7 1.4 - 6.5 K/uL   Lymphocytes Relative 73 %   Lymphs Abs 13.0 (H) 1.0 - 3.6 K/uL   Monocytes Relative 1 %   Monocytes Absolute 0.2 0.2 - 0.9 K/uL   Eosinophils Relative 0 %  Eosinophils Absolute 0.0 0 - 0.7 K/uL   Basophils Relative 0 %   Basophils Absolute 0.0 0 - 0.1 K/uL  CBC with Differential/Platelet     Status: Abnormal   Collection Time: 02/13/17  8:38 AM  Result Value Ref Range   WBC 43.7 (H) 3.6 - 11.0 K/uL   RBC 4.15 3.80 - 5.20 MIL/uL   Hemoglobin 13.7 12.0 - 16.0 g/dL   HCT 40.3 35.0 - 47.0 %   MCV 97.2 80.0 - 100.0 fL   MCH 33.1 26.0 - 34.0 pg   MCHC 34.0 32.0 - 36.0 g/dL   RDW 13.1 11.5 - 14.5 %   Platelets 303 150 - 440 K/uL   Neutrophils Relative % 23 %   Neutro Abs 10.2 (H) 1.4 - 6.5 K/uL   Lymphocytes Relative 74 %   Lymphs Abs 32.3 (H) 1.0 - 3.6 K/uL   Monocytes Relative 2 %   Monocytes Absolute 0.9 0.2 - 0.9 K/uL   Eosinophils Relative 0 %   Eosinophils Absolute 0.0 0 - 0.7 K/uL   Basophils Relative 1 %   Basophils Absolute 0.2 (H) 0 - 0.1 K/uL  CBC with Differential/Platelet     Status: Abnormal   Collection Time: 02/20/17  8:08 AM  Result Value Ref Range   WBC 17.7 (H) 3.6 - 11.0 K/uL   RBC 4.24 3.80 - 5.20 MIL/uL   Hemoglobin 14.0 12.0 - 16.0 g/dL   HCT 41.1  35.0 - 47.0 %   MCV 96.8 80.0 - 100.0 fL   MCH 33.0 26.0 - 34.0 pg   MCHC 34.1 32.0 - 36.0 g/dL   RDW 13.2 11.5 - 14.5 %   Platelets 297 150 - 440 K/uL   Neutrophils Relative % 30 %   Neutro Abs 5.3 1.4 - 6.5 K/uL   Lymphocytes Relative 68 %   Lymphs Abs 12.1 (H) 1.0 - 3.6 K/uL   Monocytes Relative 2 %   Monocytes Absolute 0.3 0.2 - 0.9 K/uL   Eosinophils Relative 0 %   Eosinophils Absolute 0.0 0 - 0.7 K/uL   Basophils Relative 0 %   Basophils Absolute 0.1 0 - 0.1 K/uL  D-Dimer, Quantitative     Status: None   Collection Time: 02/20/17  3:15 PM  Result Value Ref Range   D-Dimer, Quant 0.34 <0.50 mcg/mL FEU    Comment:   The D-Dimer test is used frequently to exclude an acute PE or DVT.  In patients with a low to moderate clinical risk assessment and a D-Dimer result <0.50 mcg/mL FEU, the likelihood of a PE or DVT is very low.  However, a thromboembolic event should not be excluded solely on the basis of the D-Dimer level.  Increased levels of D-Dimer are associated with a PE, DVT, DIC, malignancies, inflammation, sepsis, surgery, trauma, pregnancy, and advancing patient age. [Jama 2006 11:295(2): 301-601]   For additional information, please refer to: http://education.[employer].com/faq/FAQ149 (This link is being provided for information/ educational purposes only)       PHQ2/9: Depression screen [facility] 2/9 12/11/2016 09/04/2016 08/14/2016 01/03/2016 07/12/2015  Decreased Interest 0 0 1 0 0  Down, Depressed, Hopeless 1 0 1 0 1  PHQ - 2 Score 1 0 2 0 1  Altered sleeping - 0 1 - -  Tired, decreased energy - 0 0 - -  Change in appetite - 0 0 - -  Feeling bad or failure about yourself  - 0 0 - -  Trouble concentrating - 0 0 - -  Moving slowly or fidgety/restless - 0 0 - -  Suicidal thoughts - 0 0 - -  PHQ-9 Score - 0 3 - -  Difficult doing work/chores - Not difficult at all Somewhat difficult - -    Fall Risk: Fall Risk  12/11/2016 09/04/2016 08/14/2016 01/03/2016  07/12/2015  Falls in the past year? No No No No No    Assessment & Plan  1. Anxiety, generalized  Doing well at this time, she states she will only taking Cymbalta prn, explained that it does not work as prn   2. CLL (chronic lymphocytic leukemia) ([place])  Continue follow up with [name]  3. Anorexia nervosa, restricting type  stable  4. Mild episode of recurrent major depressive disorder ([place])  She is not taking Cymbalta at this time  5. B12 deficiency  Discussed resume supplementation   6. Vitamin D deficiency  Needs to resume supplementation   7. GERD without esophagitis  Stopped medication on her own, she states still hoarse, but medication did not help with symptoms

## 2017-04-20 ENCOUNTER — Emergency Department
Admission: EM | Admit: 2017-04-20 | Discharge: 2017-04-20 | Disposition: A | Discharge status: Home / Self Care | Payer: BLUE CROSS/BLUE SHIELD | Attending: Emergency Medicine | Admitting: Emergency Medicine

## 2017-04-20 ENCOUNTER — Emergency Department: Payer: BLUE CROSS/BLUE SHIELD

## 2017-04-20 DIAGNOSIS — C9110 Chronic lymphocytic leukemia of B-cell type not having achieved remission: Secondary | ICD-10-CM | POA: Diagnosis not present

## 2017-04-20 DIAGNOSIS — Z79899 Other long term (current) drug therapy: Secondary | ICD-10-CM | POA: Diagnosis not present

## 2017-04-20 DIAGNOSIS — M5441 Lumbago with sciatica, right side: Secondary | ICD-10-CM | POA: Diagnosis not present

## 2017-04-20 DIAGNOSIS — M79651 Pain in right thigh: Secondary | ICD-10-CM | POA: Insufficient documentation

## 2017-04-20 DIAGNOSIS — M545 Low back pain: Secondary | ICD-10-CM | POA: Insufficient documentation

## 2017-04-20 DIAGNOSIS — F17210 Nicotine dependence, cigarettes, uncomplicated: Secondary | ICD-10-CM | POA: Diagnosis not present

## 2017-04-20 DIAGNOSIS — M79604 Pain in right leg: Secondary | ICD-10-CM | POA: Diagnosis not present

## 2017-04-20 DIAGNOSIS — G8929 Other chronic pain: Secondary | ICD-10-CM | POA: Diagnosis not present

## 2017-04-20 DIAGNOSIS — M5126 Other intervertebral disc displacement, lumbar region: Secondary | ICD-10-CM | POA: Diagnosis not present

## 2017-04-20 LAB — BASIC METABOLIC PANEL
Anion gap: 6 (ref 5–15)
BUN: 21 mg/dL — ABNORMAL HIGH (ref 6–20)
CHLORIDE: 106 mmol/L (ref 101–111)
CO2: 26 mmol/L (ref 22–32)
Calcium: 8.9 mg/dL (ref 8.9–10.3)
Creatinine, Ser: 0.94 mg/dL (ref 0.44–1.00)
GFR calc Af Amer: >60 mL/min (ref >60)
GFR calc non Af Amer: >60 mL/min (ref >60)
Glucose, Bld: 92 mg/dL (ref 65–99)
POTASSIUM: 4.3 mmol/L (ref 3.5–5.1)
SODIUM: 138 mmol/L (ref 135–145)

## 2017-04-20 LAB — CBC WITH DIFFERENTIAL/PLATELET
BASOS ABS: 0.1 10*3/uL (ref 0–0.1)
Basophils Relative: 1 %
Eosinophils Absolute: 0.1 10*3/uL (ref 0–0.7)
Eosinophils Relative: 1 %
HEMATOCRIT: 41.7 % (ref 35.0–47.0)
Hemoglobin: 14.4 g/dL (ref 12.0–16.0)
LYMPHS PCT: 48 %
Lymphs Abs: 4.9 10*3/uL — ABNORMAL HIGH (ref 1.0–3.6)
MCH: 33.2 pg (ref 26.0–34.0)
MCHC: 34.6 g/dL (ref 32.0–36.0)
MCV: 96.1 fL (ref 80.0–100.0)
Monocytes Absolute: 0.5 10*3/uL (ref 0.2–0.9)
Monocytes Relative: 5 %
NEUTROS ABS: 4.6 10*3/uL (ref 1.4–6.5)
Neutrophils Relative %: 45 %
Platelets: 193 10*3/uL (ref 150–440)
RBC: 4.34 MIL/uL (ref 3.80–5.20)
RDW: 13.3 % (ref 11.5–14.5)
WBC: 10.2 10*3/uL (ref 3.6–11.0)

## 2017-04-20 LAB — CK: CK TOTAL: 53 U/L (ref 38–234)

## 2017-04-20 MED ORDER — OXYCODONE-ACETAMINOPHEN 5-325 MG PO TABS
2.0000 | ORAL_TABLET | ORAL | Status: AC
  Administered 2017-04-20: 2 via ORAL
  Filled 2017-04-20: qty 2

## 2017-04-20 MED ORDER — OXYCODONE-ACETAMINOPHEN 5-325 MG PO TABS: 1.0000 | ORAL_TABLET | Freq: Four times a day (QID) | ORAL | 0 refills | Status: DC | PRN

## 2017-04-20 MED ORDER — GADOBENATE DIMEGLUMINE 529 MG/ML IV SOLN
15.0000 mL | Freq: Once | INTRAVENOUS | Status: AC | PRN
  Administered 2017-04-20: 14 mL via INTRAVENOUS

## 2017-04-20 MED ORDER — MORPHINE SULFATE (PF) 4 MG/ML IV SOLN
4.0000 mg | Freq: Once | INTRAVENOUS | Status: AC
  Administered 2017-04-20: 4 mg via INTRAVENOUS
  Filled 2017-04-20: qty 1

## 2017-04-20 MED ORDER — MORPHINE SULFATE (PF) 2 MG/ML IV SOLN
2.0000 mg | Freq: Once | INTRAVENOUS | Status: AC
  Administered 2017-04-20: 2 mg via INTRAVENOUS
  Filled 2017-04-20: qty 1

## 2017-04-20 MED ORDER — ONDANSETRON 4 MG PO TBDP
4.0000 mg | ORAL_TABLET | Freq: Once | ORAL | Status: AC
  Administered 2017-04-20: 4 mg via ORAL
  Filled 2017-04-20: qty 1

## 2017-04-20 MED ORDER — MORPHINE SULFATE (PF) 4 MG/ML IV SOLN
4.0000 mg | Freq: Once | INTRAVENOUS | Status: AC
Start: 2017-04-20 — End: 2017-04-20
  Administered 2017-04-20: 4 mg via INTRAVENOUS
  Filled 2017-04-20: qty 1

## 2017-04-20 NOTE — ED Triage Notes (Signed)
Pt presents to ED via POV with c/o chronic RIGHT leg pain. Pt reports being seen for same several times, has an MRI scheduled for tomorrow. Pt reports severe aching and cramping pain in the RIGHT thigh, knee, and calf. Pt reports taking (2) Gabapentin tablets and (1) Meloxicam at 630am this morning for pain without relief. Pt is A&O, in NAD; RR even, regular, and unlabored.

## 2017-04-20 NOTE — ED Provider Notes (Signed)
[facility] Emergency Department Provider Note   ____________________________________________   First MD Initiated Contact with Patient 04/20/17 1313     (approximate)  I have reviewed the triage vital signs and the nursing notes.   HISTORY  Chief Complaint Leg Pain    HPI Monique [name] is a 46 y.o. female reports she has been having episodes of severe pain that occur in her right upper leg since about May  Patient reports that she has an MRI scheduled of her back to further evaluate tomorrow. However she reports that she is currently on meloxicam and Neurontin and is not getting adequate relief of the pain. She has seen a [facility] who are .following her closelyand have told her they suspect that she has disc disease in her lower back.  She denies any trouble with her bowel or bladder. No loss of sensation. Describes a pain that she feels like a pressure feeling that the severe tightness and sharp pain primarily along the right upper thigh.  No foot or calf pain. No fevers or chills. No falls or injury. Does have lymphoma and is following with the cancer center for this  She has previously had to have prescription pain medicine like Vicodin, but has not had any for some time. Reports she needs additional pain medicine she can get to her appointment tomorrow.  No abdominal pain. Denies pregnancy.  Past Medical History:  Diagnosis Date  . Anxiety   . Cervical cancer (HCC)    hx LEEP  . Cervical intraepithelial neoplasia I   . Depression   . Eating disorder   . History of self-harm   . Insomnia   . Obsession   . Pap smear abnormality of cervix with LGSIL   . Tobacco abuse   . Vitamin B12 deficiency (non anemic)     Patient Active Problem List   Diagnosis Date Noted  . GERD without esophagitis 12/11/2016  . CLL (chronic lymphocytic leukemia) ([place]) 11/24/2016  . Pap smear abnormality of cervix/human papillomavirus (HPV) positive  09/12/2016  . Stress incontinence 09/04/2016  . B12 deficiency 07/10/2015  . Insomnia, persistent 07/10/2015  . Mild episode of recurrent major depressive disorder ([place]) 07/10/2015  . Anorexia nervosa, restricting type 07/10/2015  . Anxiety, generalized 07/10/2015  . H/O suicide attempt 07/10/2015  . Lymphocytosis 07/10/2015  . Obsessive-compulsive disorder 07/10/2015  . Tobacco use 07/10/2015  . History of cervical dysplasia 07/18/2014    Past Surgical History:  Procedure Laterality Date  . CERVICAL BIOPSY  W/ LOOP ELECTRODE EXCISION    . TUBAL LIGATION      Prior to Admission medications   Medication Sig Start Date End Date Taking? Authorizing Provider  albuterol (PROVENTIL HFA;VENTOLIN HFA) 108 (90 Base) MCG/ACT inhaler Inhale 2 puffs into the lungs every 6 (six) hours as needed for wheezing or shortness of breath. 02/18/17   Hubbard Hartshorn, FNP  cyclobenzaprine (FLEXERIL) 10 MG tablet Take 1 tablet by mouth 2 (two) times daily.    [provider]  DULoxetine (CYMBALTA) 30 MG capsule Take 1 capsule (30 mg total) by mouth daily. 02/18/17   Hubbard Hartshorn, FNP  HYDROcodone-acetaminophen (NORCO/VICODIN) 5-325 MG tablet Take 1 tablet by mouth as needed.    [provider]  meloxicam (MOBIC) 15 MG tablet  12/11/16   [provider]  oxyCODONE-acetaminophen (ROXICET) 5-325 MG tablet Take 1 tablet by mouth every 6 (six) hours as needed for severe pain. 04/20/17   Delman Kitten, MD  traMADol (ULTRAM) 50 MG tablet Take 50 mg by mouth every 6 (six) hours as needed.  09/26/16   [provider]    Allergies Patient has no known allergies.  Family History  Problem Relation Age of Onset  . Cancer Mother        thyroid  . Alcohol abuse Father   . Depression Sister   . Cancer Sister        cevical  . Alcohol abuse Brother   . Depression Brother   . Bipolar disorder Brother   . ADD / ADHD Son     Social History Social History  Substance Use Topics  .  Smoking status: Current Every Day Smoker    Packs/day: 1.00    Years: 30.00    Start date: 09/25/1986  . Smokeless tobacco: Never Used  . Alcohol use 0.0 oz/week     Comment: rarely    Review of Systems Constitutional: No fever/chills Eyes: No visual changes. ENT: No sore throat. Cardiovascular: Denies chest pain. Respiratory: Denies shortness of breath. Gastrointestinal: No abdominal pain.  No nausea, no vomiting.  No diarrhea.  No constipation. Genitourinary: Negative for dysuria. Musculoskeletal: Moderate right lower to mid lower lumbar back pain. Reports she had shoulder pain a week ago which seems to be better now in the left shoulder. Skin: Negative for rash. Neurological: Negative for headaches, focal weakness or numbness.    ____________________________________________   PHYSICAL EXAM:  VITAL SIGNS: ED Triage Vitals  Enc Vitals Group     BP 04/20/17 1056 95/73     Pulse Rate 04/20/17 1056 82     Resp 04/20/17 1056 18     Temp 04/20/17 1056 98.4 F (36.9 C)     Temp Source 04/20/17 1056 Oral     SpO2 04/20/17 1056 99 %     Weight 04/20/17 1057 150 lb (68 kg)     Height 04/20/17 1057 5\' 7"  (1.702 m)     Head Circumference --      Peak Flow --      Pain Score 04/20/17 1056 10     Pain Loc --      Pain Edu? --      Excl. in [place]? --     Constitutional: Alert and oriented. Well appearing and in no acute distress. Eyes: Conjunctivae are normal. Head: Atraumatic. Nose: No congestion/rhinnorhea. Mouth/Throat: Mucous membranes are moist. Neck: No stridor.   Cardiovascular: Normal rate, regular rhythm. Grossly normal heart sounds.  Good peripheral circulation. Respiratory: Normal respiratory effort.  No retractions. Lungs CTAB. Gastrointestinal: Soft and nontender. No distention. Musculoskeletal:   Lower Extremities  No edema. Normal DP/PT pulses bilateral with good cap refill.  Normal neuro-motor function lower extremities bilateral.  RIGHT Right lower  extremity demonstrates normal strength, good use of all muscles but does have limitation to flexion and extension at the right hip secondary to pain. No edema bruising or contusions of the right hip, right knee, right ankle. She does report tenderness to palpation across the anterior right thigh muscles. Full range of motion of the right lower extremity. No pain on axial loading. No evidence of trauma. Normal dorsalis pedis and posterior tibial.  LEFT Left lower extremity demonstrates normal strength, good use of all muscles. No edema bruising or contusions of the hip,  knee, ankle. Full range of motion of the left lower extremity without pain. No pain on axial loading. No evidence of trauma.   Neurologic:  Normal speech and language. No gross focal  neurologic deficits are appreciated.  Skin:  Skin is warm, dry and intact. No rash noted. Psychiatric: Mood and affect are normal. Speech and behavior are normal.  ____________________________________________   LABS (all labs ordered are listed, but only abnormal results are displayed)  Labs Reviewed  BASIC METABOLIC PANEL - Abnormal; Notable for the following:       Result Value   BUN 21 (*)    All other components within normal limits  CBC WITH DIFFERENTIAL/PLATELET - Abnormal; Notable for the following:    Lymphs Abs 4.9 (*)    All other components within normal limits  CK   ____________________________________________  EKG   ____________________________________________  RADIOLOGY  Mr Lumbar Spine W Wo Contrast  Result Date: 04/20/2017 CLINICAL DATA:  46 year old female diagnosed with chronic lymphocytic leukemia in February this year. Severe pain radiating to the right lower extremity. EXAM: MRI LUMBAR SPINE WITHOUT AND WITH CONTRAST TECHNIQUE: Multiplanar and multiecho pulse sequences of the lumbar spine were obtained without and with intravenous contrast. CONTRAST:  79mL MULTIHANCE GADOBENATE DIMEGLUMINE 529 MG/ML IV SOLN  COMPARISON:  CT chest abdomen and pelvis 01/21/2017. FINDINGS: Segmentation: Normal aside from partially sacralized L5 level (right side assimilation joint) as demonstrated on the comparison CT. Vestigial L5-S1 disc space. Full size ribs at T12. Alignment:  Stable an normal lumbar vertebral height and alignment. Vertebrae: Diffusely abnormal decreased T1 bone marrow signal throughout the visible spine and pelvis (series 3, image 10 and series 6, image 40). No foci of abnormal increased STIR signal to suggest marrow edema. Following contrast there is uniform low level marrow enhancement, no discrete or destructive marrow lesion. Conus medullaris: Extends to the T12-L1 level and appears normal. No abnormal intradural enhancement. Normal cauda equina nerve roots. Paraspinal and other soft tissues: Bulky left greater than right retroperitoneal lymphadenopathy measures up to 3.5 cm short axis, and does not appear significantly changed since 01/21/2017 (series 7, image 28 today versus series 2, image 87 of the CT comparison). Partially visible bilateral iliac chains lymphadenopathy. The low visible liver and kidneys appear within normal limits. Visible bowel loops are within normal limits. Negative visualized posterior paraspinal soft tissues. Disc levels: From T11-T12 to L3-L4 there is no significant lumbar disc degeneration. There is a subtle right paracentral disc protrusion at L4-L5. The L5-S1 disc is normal. Mild lower lumbar facet and ligament flavum hypertrophy at L3-L4 and L4-L5. No lumbar spinal stenosis or convincing neural impingement. IMPRESSION: 1. Bulky retroperitoneal lymphadenopathy, greater on the left, not significantly changed since 01/21/2017. 2. Diffusely abnormal bone marrow signal, but as there is no discrete or destructive osseous lesion I favor this is treatment related. Query anemia. 3. Otherwise no metastatic disease in the lumbar spine. 4. Minimal lumbar spine degeneration, with no spinal  stenosis or convincing neural impingement. Electronically Signed   By: Genevie Ann M.D.   On: 04/20/2017 17:10   US Venous Img Lower Unilateral Right  Result Date: 04/20/2017 CLINICAL DATA:  46 y/o F; chronic intermittent right thigh pain with pain this morning. EXAM: RIGHT LOWER EXTREMITY VENOUS DOPPLER ULTRASOUND TECHNIQUE: Gray-scale sonography with graded compression, as well as color Doppler and duplex ultrasound were performed to evaluate the lower extremity deep venous systems from the level of the common femoral vein and including the common femoral, femoral, profunda femoral, popliteal and calf veins including the posterior tibial, peroneal and gastrocnemius veins when visible. The superficial great saphenous vein was also interrogated. Spectral Doppler was utilized to evaluate flow at rest and with distal  augmentation maneuvers in the common femoral, femoral and popliteal veins. COMPARISON:  09/23/2016 right lower extremity venous ultrasound. FINDINGS: Contralateral Common Femoral Vein: Respiratory phasicity is normal and symmetric with the symptomatic side. No evidence of thrombus. Normal compressibility. Common Femoral Vein: No evidence of thrombus. Normal compressibility, respiratory phasicity and response to augmentation. Saphenofemoral Junction: No evidence of thrombus. Normal compressibility and flow on color Doppler imaging. Profunda Femoral Vein: No evidence of thrombus. Normal compressibility and flow on color Doppler imaging. Femoral Vein: No evidence of thrombus. Normal compressibility, respiratory phasicity and response to augmentation. Popliteal Vein: No evidence of thrombus. Normal compressibility, respiratory phasicity and response to augmentation. Calf Veins: No evidence of thrombus. Normal compressibility and flow on color Doppler imaging. Superficial Great Saphenous Vein: No evidence of thrombus. Normal compressibility and flow on color Doppler imaging. Venous Reflux:  None. Other  Findings:  None. IMPRESSION: No evidence of DVT within the right lower extremity. Electronically Signed   By: Kristine Garbe M.D.   On: 04/20/2017 16:14   Dg Femur Min 2 Views Right  Result Date: 04/20/2017 CLINICAL DATA:  Pain over the right upper leg and lower back. History of cancer. EXAM: RIGHT FEMUR 2 VIEWS COMPARISON:  09/23/2016 FINDINGS: There is no evidence of fracture or other focal bone lesions. Soft tissues are unremarkable. IMPRESSION: Stable and negative femur radiographs. Electronically Signed   By: Monte Fantasia M.D.   On: 04/20/2017 13:56    ____________________________________________   PROCEDURES  Procedure(s) performed: None  Procedures  Critical Care performed: No  ____________________________________________   INITIAL IMPRESSION / ASSESSMENT AND PLAN / ED COURSE  Pertinent labs & imaging results that were available during my care of the patient were reviewed by me and considered in my medical decision making (see chart for details).  Right upper leg pain. Denies trauma. History of lymphoma. Has had this recurring on and off for a few months but reports worsening symptoms. Discussed with patient and we will obtain an MRI of the lumbar spine to evaluate for possible spinal etiology of the pain given it rests in her lumbar radicular distribution. No fevers. No infectious symptoms. We'll check a CK though she does not appear to have obvious myalgias. Reassuring distal vascular and neurologic exam. No symptoms of cauda equina. Obtain x-ray of the femur to evaluate for any acute bony abnormality.  Clinical Course as of Apr 20 1753  Nancy Fetter Apr 20, 2017  1537 Patient reports ongoing pain. Continues to be tearful. Fully awake and alert. We'll provide additional morphine now. Awaiting MRI and right lower extremity DVT study  [MQ]  1647 Dr. Nevada Crane (rads) recommends MRI be changed to with contrast.  [MQ]  1746 Case, clinical presentation, MRI, imaging reviewd with Dr.  Burton Apley ([facility]).   [MQ]    Clinical Course User Index [MQ] Delman Kitten, MD   ----------------------------------------- 5:53 PM on 04/20/2017 -----------------------------------------  No discharge the patient. Prescriber database reviewed, no recent prescriptions.  I will prescribe the patient a narcotic pain medicine due to their condition which I anticipate will cause at least moderate pain short term. I discussed with the patient safe use of narcotic pain medicines, and that they are not to drive, work in dangerous areas, or ever take more than prescribed (no more than 1 pill every 6 hours). We discussed that this is the type of medication that can be  overdosed on and the risks of this type of medicine. Patient is very agreeable to only use as prescribed and to  never use more than prescribed.   Return precautions and treatment recommendations and follow-up discussed with the patient who is agreeable with the plan.   ____________________________________________   FINAL CLINICAL IMPRESSION(S) / ED DIAGNOSES  Final diagnoses:  Right thigh pain  Chronic right-sided low back pain, with sciatica presence unspecified      NEW MEDICATIONS STARTED DURING THIS VISIT:  New Prescriptions   OXYCODONE-ACETAMINOPHEN (ROXICET) 5-325 MG TABLET    Take 1 tablet by mouth every 6 (six) hours as needed for severe pain.     Note:  This document was prepared using Dragon voice recognition software and may include unintentional dictation errors.     Delman Kitten, MD 04/20/17 1754

## 2017-04-20 NOTE — Discharge Instructions (Signed)
You have been seen in the Emergency Department (ED)  today for back pain and right thigh pain.    Take oxycodone as prescribed for severe pain. Do not drink alcohol, drive or participate in any other potentially dangerous activities while taking this medication as it may make you sleepy. Do not take this medication with any other sedating medications, either prescription or over-the-counter. If you were prescribed Percocet or Vicodin, do not take these with acetaminophen (Tylenol) as it is already contained within these medications.   This medication is an opiate (or narcotic) pain medication and can be habit forming.  Use it as little as possible to achieve adequate pain control.  Do not use or use it with extreme caution if you have a history of opiate abuse or dependence.  If you are on a pain contract with your primary care doctor or a pain specialist, be sure to let them know you were prescribed this medication today from the [facility] Emergency Department.  This medication is intended for your use only - do not give any to anyone else and keep it in a secure place where nobody else, especially children, have access to it.  It will also cause or worsen constipation, so you may want to consider taking an over-the-counter stool softener while you are taking this medication.  Please follow up with your doctor as soon as possible regarding today's ED visit and your back pain.  Return to the ED for worsening back pain, fever, weakness or numbness of either leg, or if you develop either (1) an inability to urinate or have bowel movements, or (2) loss of your ability to control your bathroom functions (if you start having "accidents"), or if you develop other new symptoms that concern you.

## 2017-04-21 DIAGNOSIS — M791 Myalgia: Secondary | ICD-10-CM | POA: Diagnosis not present

## 2017-04-21 DIAGNOSIS — M5430 Sciatica, unspecified side: Secondary | ICD-10-CM | POA: Diagnosis not present

## 2017-04-21 DIAGNOSIS — M9905 Segmental and somatic dysfunction of pelvic region: Secondary | ICD-10-CM | POA: Diagnosis not present

## 2017-04-21 DIAGNOSIS — Z72820 Sleep deprivation: Secondary | ICD-10-CM | POA: Diagnosis not present

## 2017-04-22 ENCOUNTER — Telehealth: Payer: Self-pay

## 2017-04-22 DIAGNOSIS — M9905 Segmental and somatic dysfunction of pelvic region: Secondary | ICD-10-CM | POA: Diagnosis not present

## 2017-04-22 DIAGNOSIS — M5430 Sciatica, unspecified side: Secondary | ICD-10-CM | POA: Diagnosis not present

## 2017-04-22 DIAGNOSIS — Z72820 Sleep deprivation: Secondary | ICD-10-CM | POA: Diagnosis not present

## 2017-04-22 DIAGNOSIS — M791 Myalgia: Secondary | ICD-10-CM | POA: Diagnosis not present

## 2017-04-22 NOTE — Telephone Encounter (Signed)
Leg Pain started Sunday morning at 1:30 a.m. And when to the ER. They performed a X-Ray and MRI and faxed records to Orthopedics at [facility] and they are unable to view them until tomorrow. Could you please call her in some Prednisone? Patient is unable to move since she is in so much pain. Patient has seen Dr. Ancil Boozer previously for this leg pain. Patient is taking Oxycodone and is not touching the pain, also seeing chiropractor for her leg pain.

## 2017-04-22 NOTE — Telephone Encounter (Signed)
I am so sorry, but I cannot fill prednisone without seeing her, and I am out of the office this afternoon, she may want to go to [facility] today as walk in

## 2017-04-23 DIAGNOSIS — M7061 Trochanteric bursitis, right hip: Secondary | ICD-10-CM | POA: Diagnosis not present

## 2017-04-23 DIAGNOSIS — Z72820 Sleep deprivation: Secondary | ICD-10-CM | POA: Diagnosis not present

## 2017-04-23 DIAGNOSIS — M9905 Segmental and somatic dysfunction of pelvic region: Secondary | ICD-10-CM | POA: Diagnosis not present

## 2017-04-23 DIAGNOSIS — M76899 Other specified enthesopathies of unspecified lower limb, excluding foot: Secondary | ICD-10-CM | POA: Insufficient documentation

## 2017-04-23 DIAGNOSIS — M76891 Other specified enthesopathies of right lower limb, excluding foot: Secondary | ICD-10-CM | POA: Diagnosis not present

## 2017-04-23 DIAGNOSIS — M5430 Sciatica, unspecified side: Secondary | ICD-10-CM | POA: Diagnosis not present

## 2017-04-23 DIAGNOSIS — M791 Myalgia: Secondary | ICD-10-CM | POA: Diagnosis not present

## 2017-04-23 NOTE — Telephone Encounter (Signed)
Pt is going to [facility] today

## 2017-04-25 DIAGNOSIS — M5430 Sciatica, unspecified side: Secondary | ICD-10-CM | POA: Diagnosis not present

## 2017-04-25 DIAGNOSIS — M791 Myalgia: Secondary | ICD-10-CM | POA: Diagnosis not present

## 2017-04-25 DIAGNOSIS — M9905 Segmental and somatic dysfunction of pelvic region: Secondary | ICD-10-CM | POA: Diagnosis not present

## 2017-04-25 DIAGNOSIS — Z72820 Sleep deprivation: Secondary | ICD-10-CM | POA: Diagnosis not present

## 2017-04-28 ENCOUNTER — Ambulatory Visit
Admission: RE | Admit: 2017-04-28 | Discharge: 2017-04-28 | Disposition: A | Discharge status: Home / Self Care | Payer: BLUE CROSS/BLUE SHIELD | Source: Ambulatory Visit | Attending: Oncology | Admitting: Oncology

## 2017-04-28 DIAGNOSIS — R590 Localized enlarged lymph nodes: Secondary | ICD-10-CM | POA: Diagnosis not present

## 2017-04-28 DIAGNOSIS — C9110 Chronic lymphocytic leukemia of B-cell type not having achieved remission: Secondary | ICD-10-CM

## 2017-04-28 DIAGNOSIS — C9190 Lymphoid leukemia, unspecified not having achieved remission: Secondary | ICD-10-CM | POA: Insufficient documentation

## 2017-04-28 DIAGNOSIS — I700 Atherosclerosis of aorta: Secondary | ICD-10-CM | POA: Insufficient documentation

## 2017-04-28 MED ORDER — IOPAMIDOL (ISOVUE-300) INJECTION 61%
100.0000 mL | Freq: Once | INTRAVENOUS | Status: AC | PRN
  Administered 2017-04-28: 100 mL via INTRAVENOUS

## 2017-04-28 NOTE — Progress Notes (Signed)
[place]  [phone] [phone]  ID: Monique [name] OB: 21-Feb-1971  MR#: [id number]  RCV#:[id number]  Patient Care Team: [name], MD as PCP - General (Family Medicine)  CHIEF COMPLAINT: CLL.  INTERVAL HISTORY: Patient returns to clinic today for further evaluation and discussion of her imaging results. She continues to be anxious, but otherwise feels well. She has no neurologic complaints. She denies any recent fevers, illnesses, or weight loss. She has noted no new lymphadenopathy. She has no chest pain or shortness of breath. She denies any nausea, vomiting, constipation, or diarrhea. She has no urinary complaints. Patient offers no specific complaints today.  REVIEW OF SYSTEMS:   Review of Systems  Constitutional: Negative.  Negative for fever, malaise/fatigue and weight loss.  HENT: Negative for congestion.   Respiratory: Negative for cough and shortness of breath.   Cardiovascular: Negative.  Negative for chest pain and leg swelling.  Gastrointestinal: Negative.  Negative for abdominal pain.  Genitourinary: Negative.   Musculoskeletal: Negative.   Skin: Negative.  Negative for rash.  Neurological: Negative.  Negative for sensory change and weakness.  Psychiatric/Behavioral: The patient is nervous/anxious.     As per HPI. Otherwise, a complete review of systems is negative.  PAST MEDICAL HISTORY: Past Medical History:  Diagnosis Date  . Anxiety   . Cervical cancer (HCC)    hx LEEP  . Cervical intraepithelial neoplasia I   . Depression   . Eating disorder   . History of self-harm   . Insomnia   . Obsession   . Pap smear abnormality of cervix with LGSIL   . Tobacco abuse   . Vitamin B12 deficiency (non anemic)     PAST SURGICAL HISTORY: Past Surgical History:  Procedure Laterality Date  . CERVICAL BIOPSY  W/ LOOP ELECTRODE EXCISION    . TUBAL LIGATION      FAMILY HISTORY: Family History  Problem Relation  Age of Onset  . Cancer Mother        thyroid  . Alcohol abuse Father   . Depression Sister   . Cancer Sister        cevical  . Alcohol abuse Brother   . Depression Brother   . Bipolar disorder Brother   . ADD / ADHD Son     ADVANCED DIRECTIVES (Y/N):  N  HEALTH MAINTENANCE: Social History  Substance Use Topics  . Smoking status: Current Every Day Smoker    Packs/day: 1.00    Years: 30.00    Start date: 09/25/1986  . Smokeless tobacco: Never Used  . Alcohol use 0.0 oz/week     Comment: rarely     Colonoscopy:  PAP:  Bone density:  Lipid panel:  No Known Allergies  Current Outpatient Prescriptions  Medication Sig Dispense Refill  . cyclobenzaprine (FLEXERIL) 10 MG tablet Take 1 tablet by mouth 2 (two) times daily.    Marland Kitchen HYDROcodone-acetaminophen (NORCO/VICODIN) 5-325 MG tablet Take 1 tablet by mouth as needed.    . meloxicam (MOBIC) 15 MG tablet      No current facility-administered medications for this visit.     OBJECTIVE: Vitals:   04/30/17 1556  BP: 107/67  Pulse: 81  Resp: 20  Temp: (!) 97.4 F (36.3 C)     Body mass index is 23.6 kg/m.    ECOG FS:0 - Asymptomatic  General: Well-developed, well-nourished, no acute distress. Eyes: Pink conjunctiva, anicteric sclera. Lungs: Clear to auscultation bilaterally. Heart: Regular rate and rhythm. No rubs, murmurs, or  gallops. Abdomen: Soft, nontender, nondistended. No organomegaly noted, normoactive bowel sounds. Musculoskeletal: No edema, cyanosis, or clubbing. Neuro: Alert, answering all questions appropriately. Cranial nerves grossly intact. Skin: No rashes or petechiae noted. Psych: Normal affect. Lymphatics: Minimally palpable bilateral cervical lymphadenopathy. No other lymphadenopathy palpated.  LAB RESULTS:  Lab Results  Component Value Date   NA 138 04/20/2017   K 4.3 04/20/2017   CL 106 04/20/2017   CO2 26 04/20/2017   GLUCOSE 92 04/20/2017   BUN 21 (H) 04/20/2017   CREATININE 0.94  04/20/2017   CALCIUM 8.9 04/20/2017   PROT 6.6 09/23/2016   ALBUMIN 3.9 09/23/2016   AST 22 09/23/2016   ALT 16 09/23/2016   ALKPHOS 49 09/23/2016   BILITOT 0.5 09/23/2016   GFRNONAA >60 04/20/2017   GFRAA >60 04/20/2017    Lab Results  Component Value Date   WBC 12.4 (H) 04/30/2017   NEUTROABS 4.5 04/30/2017   HGB 14.1 04/30/2017   HCT 41.0 04/30/2017   MCV 95.2 04/30/2017   PLT 284 04/30/2017     STUDIES: Ct Chest W Contrast  Result Date: 04/28/2017 CLINICAL DATA:  CLL, cervical cancer. EXAM: CT CHEST, ABDOMEN, AND PELVIS WITH CONTRAST TECHNIQUE: Multidetector CT imaging of the chest, abdomen and pelvis was performed following the standard protocol during bolus administration of intravenous contrast. CONTRAST:  149mL ISOVUE-300 IOPAMIDOL (ISOVUE-300) INJECTION 61% COMPARISON:  01/21/2017. FINDINGS: CT CHEST FINDINGS Cardiovascular: Atherosclerotic calcification of the arterial vasculature. Heart size normal. No pericardial effusion. Mediastinum/Nodes: Mediastinal lymph nodes measure up to 11 mm low right paratracheal station, as before. Bi hilar lymph nodes are also stable. Axillary lymph nodes measure up to 1.7 cm on the left, as before. Esophagus is grossly unremarkable. Lungs/Pleura: Mild centrilobular emphysema. Scattered subpleural nodules measure 5 mm or less in size, stable. No pleural fluid. Scattered debris in the trachea. Musculoskeletal: No worrisome lytic or sclerotic lesions. CT ABDOMEN PELVIS FINDINGS Hepatobiliary: Liver and gallbladder are unremarkable. No biliary ductal dilatation. Pancreas: Negative. Spleen: Negative. Adrenals/Urinary Tract: Adrenal glands and kidneys are unremarkable. Ureters are decompressed. Bladder is grossly unremarkable. Stomach/Bowel: Stomach, small bowel and appendix are unremarkable. Stool is seen throughout the colon, indicative of constipation. Vascular/Lymphatic: Atherosclerotic calcification of the aorta without aneurysm. Gastrohepatic  ligament lymph nodes measure up to 1.4 cm, stable. Porta hepatis lymph nodes, up to 1.7 cm, stable. Retroperitoneal lymph nodes up to 4.0 x 5.4 cm, stable when remeasured on the prior study. Mesenteric lymph nodes measure up to 1.8 cm, stable. Iliac chain adenopathy measures up to 2.3 cm in the external iliac station on the left, minimally decreased from 2.8 cm on the prior study. Index right external iliac lymph node measures 2.2 cm, previously 2.4 cm. Reproductive: Uterus is visualized.  No adnexal mass. Other: No free fluid. Mesenteries and peritoneum are otherwise unremarkable. Musculoskeletal: No worrisome lytic or sclerotic lesions. IMPRESSION: 1. Minimal improvement in bilateral external iliac adenopathy. Adenopathy throughout the chest, abdomen and pelvis is otherwise stable. 2.  Aortic atherosclerosis (ICD10-170.0). Electronically Signed   By: Lorin Picket M.D.   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 Severe pain radiating to the right lower extremity. EXAM: MRI LUMBAR SPINE WITHOUT AND WITH CONTRAST TECHNIQUE: Multiplanar and multiecho pulse sequences of the lumbar spine were obtained without and with intravenous contrast. CONTRAST:  93mL MULTIHANCE GADOBENATE DIMEGLUMINE 529 MG/ML IV SOLN COMPARISON:  CT chest  abdomen and pelvis 01/21/2017. FINDINGS: Segmentation: Normal aside from partially sacralized L5 level (right side assimilation joint) as demonstrated on the comparison CT. Vestigial L5-S1 disc space. Full size ribs at T12. Alignment:  Stable an normal lumbar vertebral height and alignment. Vertebrae: Diffusely abnormal decreased T1 bone marrow signal throughout the visible spine and pelvis (series 3, image 10 and series 6, image 40). No foci of abnormal increased STIR signal to suggest marrow edema. Following contrast there is uniform low level marrow  enhancement, no discrete or destructive marrow lesion. Conus medullaris: Extends to the T12-L1 level and appears normal. No abnormal intradural enhancement. Normal cauda equina nerve roots. Paraspinal and other soft tissues: Bulky left greater than right retroperitoneal lymphadenopathy measures up to 3.5 cm short axis, and does not appear significantly changed since 01/21/2017 (series 7, image 28 today versus series 2, image 87 of the CT comparison). Partially visible bilateral iliac chains lymphadenopathy. The low visible liver and kidneys appear within normal limits. Visible bowel loops are within normal limits. Negative visualized posterior paraspinal soft tissues. Disc levels: From T11-T12 to L3-L4 there is no significant lumbar disc degeneration. There is a subtle right paracentral disc protrusion at L4-L5. The L5-S1 disc is normal. Mild lower lumbar facet and ligament flavum hypertrophy at L3-L4 and L4-L5. No lumbar spinal stenosis or convincing neural impingement. IMPRESSION: 1. Bulky retroperitoneal lymphadenopathy, greater on the left, not significantly changed since 01/21/2017. 2. Diffusely abnormal bone marrow signal, but as there is no discrete or destructive osseous lesion I favor this is treatment related. Query anemia. 3. Otherwise no metastatic disease in the lumbar spine. 4. Minimal lumbar spine degeneration, with no spinal stenosis or convincing neural impingement. Electronically Signed   By: Genevie Ann M.D.   On: 04/20/2017 17:10   Ct Abdomen Pelvis W Contrast  Result Date: 04/28/2017 CLINICAL DATA:  CLL, cervical cancer. EXAM: CT CHEST, ABDOMEN, AND PELVIS WITH CONTRAST TECHNIQUE: Multidetector CT imaging of the chest, abdomen and pelvis was performed following the standard protocol during bolus administration of intravenous contrast. CONTRAST:  11mL ISOVUE-300 IOPAMIDOL (ISOVUE-300) INJECTION 61% COMPARISON:  01/21/2017. FINDINGS: CT CHEST FINDINGS Cardiovascular: Atherosclerotic calcification  of the arterial vasculature. Heart size normal. No pericardial effusion. Mediastinum/Nodes: Mediastinal lymph nodes measure up to 11 mm low right paratracheal station, as before. Bi hilar lymph nodes are also stable. Axillary lymph nodes measure up to 1.7 cm on the left, as before. Esophagus is grossly unremarkable. Lungs/Pleura: Mild centrilobular emphysema. Scattered subpleural nodules measure 5 mm or less in size, stable. No pleural fluid. Scattered debris in the trachea. Musculoskeletal: No worrisome lytic or sclerotic lesions. CT ABDOMEN PELVIS FINDINGS Hepatobiliary: Liver and gallbladder are unremarkable. No biliary ductal dilatation. Pancreas: Negative. Spleen: Negative. Adrenals/Urinary Tract: Adrenal glands and kidneys are unremarkable. Ureters are decompressed. Bladder is grossly unremarkable. Stomach/Bowel: Stomach, small bowel and appendix are unremarkable. Stool is seen throughout the colon, indicative of constipation. Vascular/Lymphatic: Atherosclerotic calcification of the aorta without aneurysm. Gastrohepatic ligament lymph nodes measure up to 1.4 cm, stable. Porta hepatis lymph nodes, up to 1.7 cm, stable. Retroperitoneal lymph nodes up to 4.0 x 5.4 cm, stable when remeasured on the prior study. Mesenteric lymph nodes measure up to 1.8 cm, stable. Iliac chain adenopathy measures up to 2.3 cm in the external iliac station on the left, minimally decreased from 2.8 cm on the prior study. Index right external iliac lymph node measures 2.2 cm, previously 2.4 cm. Reproductive: Uterus is visualized.  No adnexal mass. Other: No free fluid. Mesenteries  and peritoneum are otherwise unremarkable. Musculoskeletal: No worrisome lytic or sclerotic lesions. IMPRESSION: 1. Minimal improvement in bilateral external iliac adenopathy. Adenopathy throughout the chest, abdomen and pelvis is otherwise stable. 2.  Aortic atherosclerosis (ICD10-170.0). Electronically Signed   By: Lorin Picket M.D.   On: 04/28/2017  13:16   US Venous Img Lower Unilateral Right  Result Date: 04/20/2017 CLINICAL DATA:  46 y/o F; chronic intermittent right thigh pain with pain this morning. EXAM: RIGHT LOWER EXTREMITY VENOUS DOPPLER ULTRASOUND TECHNIQUE: Gray-scale sonography with graded compression, as well as color Doppler and duplex ultrasound were performed to evaluate the lower extremity deep venous systems from the level of the common femoral vein and including the common femoral, femoral, profunda femoral, popliteal and calf veins including the posterior tibial, peroneal and gastrocnemius veins when visible. The superficial great saphenous vein was also interrogated. Spectral Doppler was utilized to evaluate flow at rest and with distal augmentation maneuvers in the common femoral, femoral and popliteal veins. COMPARISON:  09/23/2016 right lower extremity venous ultrasound. FINDINGS: Contralateral Common Femoral Vein: Respiratory phasicity is normal and symmetric with the symptomatic side. No evidence of thrombus. Normal compressibility. Common Femoral Vein: No evidence of thrombus. Normal compressibility, respiratory phasicity and response to augmentation. Saphenofemoral Junction: No evidence of thrombus. Normal compressibility and flow on color Doppler imaging. Profunda Femoral Vein: No evidence of thrombus. Normal compressibility and flow on color Doppler imaging. Femoral Vein: No evidence of thrombus. Normal compressibility, respiratory phasicity and response to augmentation. Popliteal Vein: No evidence of thrombus. Normal compressibility, respiratory phasicity and response to augmentation. Calf Veins: No evidence of thrombus. Normal compressibility and flow on color Doppler imaging. Superficial Great Saphenous Vein: No evidence of thrombus. Normal compressibility and flow on color Doppler imaging. Venous Reflux:  None. Other Findings:  None. IMPRESSION: No evidence of DVT within the right lower extremity. Electronically Signed   By:  Kristine Garbe M.D.   On: 04/20/2017 16:14   Dg Femur Min 2 Views Right  Result Date: 04/20/2017 CLINICAL DATA:  Pain over the right upper leg and lower back. History of cancer. EXAM: RIGHT FEMUR 2 VIEWS COMPARISON:  09/23/2016 FINDINGS: There is no evidence of fracture or other focal bone lesions. Soft tissues are unremarkable. IMPRESSION: Stable and negative femur radiographs. Electronically Signed   By: Monte Fantasia M.D.   On: 04/20/2017 13:56    ASSESSMENT: CLL, Rai stage 1  PLAN:    1. CLL: Confirmed by peripheral blood flow cytometry. CT scan results from April 28, 2017 reviewed independently and reported as above revealed minimal change in patient's known lymphadenopathy. She completed cycle 4 of weekly Rituxan on February 20, 2017. No intervention is needed at this time. Given patient's inadequate response, she likely will require more aggressive treatment in the future possibly using Treanda plus Rituxan. Return to clinic in 3 months with repeat imaging and further evaluation.  2. Lymphadenopathy: Secondary CLL. Patient does not require further ENT follow-up for biopsy.  3. Cough/congestion: Resolved.  Approximately 30 minutes was spent in discussion of which greater than 50% was consultation.  Patient expressed understanding and was in agreement with this plan. She also understands that She can call clinic at any time with any questions, concerns, or complaints.    Lloyd Huger, MD   05/02/2017 11:38 AM

## 2017-04-30 ENCOUNTER — Inpatient Hospital Stay (HOSPITAL_BASED_OUTPATIENT_CLINIC_OR_DEPARTMENT_OTHER): Payer: BLUE CROSS/BLUE SHIELD | Admitting: Oncology

## 2017-04-30 ENCOUNTER — Inpatient Hospital Stay: Payer: BLUE CROSS/BLUE SHIELD | Attending: Oncology

## 2017-04-30 VITALS — BP 107/67 | HR 81 | Temp 97.4°F | Resp 20 | Wt 150.7 lb

## 2017-04-30 DIAGNOSIS — R590 Localized enlarged lymph nodes: Secondary | ICD-10-CM | POA: Diagnosis not present

## 2017-04-30 DIAGNOSIS — Z915 Personal history of self-harm: Secondary | ICD-10-CM

## 2017-04-30 DIAGNOSIS — Z808 Family history of malignant neoplasm of other organs or systems: Secondary | ICD-10-CM | POA: Insufficient documentation

## 2017-04-30 DIAGNOSIS — E538 Deficiency of other specified B group vitamins: Secondary | ICD-10-CM

## 2017-04-30 DIAGNOSIS — F329 Major depressive disorder, single episode, unspecified: Secondary | ICD-10-CM

## 2017-04-30 DIAGNOSIS — F419 Anxiety disorder, unspecified: Secondary | ICD-10-CM | POA: Diagnosis not present

## 2017-04-30 DIAGNOSIS — C9190 Lymphoid leukemia, unspecified not having achieved remission: Secondary | ICD-10-CM | POA: Diagnosis not present

## 2017-04-30 DIAGNOSIS — G4700 Insomnia, unspecified: Secondary | ICD-10-CM | POA: Insufficient documentation

## 2017-04-30 DIAGNOSIS — M5126 Other intervertebral disc displacement, lumbar region: Secondary | ICD-10-CM

## 2017-04-30 DIAGNOSIS — Z79899 Other long term (current) drug therapy: Secondary | ICD-10-CM | POA: Diagnosis not present

## 2017-04-30 DIAGNOSIS — Z8541 Personal history of malignant neoplasm of cervix uteri: Secondary | ICD-10-CM

## 2017-04-30 DIAGNOSIS — I700 Atherosclerosis of aorta: Secondary | ICD-10-CM | POA: Diagnosis not present

## 2017-04-30 DIAGNOSIS — C9110 Chronic lymphocytic leukemia of B-cell type not having achieved remission: Secondary | ICD-10-CM

## 2017-04-30 DIAGNOSIS — F17210 Nicotine dependence, cigarettes, uncomplicated: Secondary | ICD-10-CM | POA: Diagnosis not present

## 2017-04-30 LAB — CBC WITH DIFFERENTIAL/PLATELET
BASOS ABS: 0 10*3/uL (ref 0–0.1)
Basophils Relative: 0 %
Eosinophils Absolute: 0.1 10*3/uL (ref 0–0.7)
Eosinophils Relative: 1 %
HEMATOCRIT: 41 % (ref 35.0–47.0)
Hemoglobin: 14.1 g/dL (ref 12.0–16.0)
LYMPHS PCT: 58 %
Lymphs Abs: 7.1 10*3/uL — ABNORMAL HIGH (ref 1.0–3.6)
MCH: 32.7 pg (ref 26.0–34.0)
MCHC: 34.4 g/dL (ref 32.0–36.0)
MCV: 95.2 fL (ref 80.0–100.0)
MONO ABS: 0.6 10*3/uL (ref 0.2–0.9)
MONOS PCT: 5 %
NEUTROS ABS: 4.5 10*3/uL (ref 1.4–6.5)
Neutrophils Relative %: 36 %
Platelets: 284 10*3/uL (ref 150–440)
RBC: 4.3 MIL/uL (ref 3.80–5.20)
RDW: 12.7 % (ref 11.5–14.5)
WBC: 12.4 10*3/uL — ABNORMAL HIGH (ref 3.6–11.0)

## 2017-04-30 NOTE — Progress Notes (Signed)
Patient here today for CT results. 

## 2017-06-27 ENCOUNTER — Telehealth: Payer: Self-pay | Admitting: *Deleted

## 2017-06-27 NOTE — Telephone Encounter (Signed)
Patient called to report that the nodes in her neck are increasing in size. She reports feeling nodes all up the left neck. Asking ifd her appts needs to be moved up. Please advise

## 2017-06-27 NOTE — Telephone Encounter (Signed)
Next week is fine

## 2017-07-01 NOTE — Progress Notes (Signed)
[place]  [phone] [phone]  ID: [name] OB: 09-Jun-1971  MR#: [id number]  UUV#:[id number]  Patient Care Team: [name], MD as PCP - General (Family Medicine)  CHIEF COMPLAINT: CLL.  INTERVAL HISTORY: Patient returns to clinic today for further evaluation and discussion of her imaging results. She continues to be anxious, but otherwise feels well. She has no neurologic complaints. She denies any recent fevers, illnesses, or weight loss. She feels the lymph nodes in her neck are worse. She has no chest pain or shortness of breath. She denies any nausea, vomiting, constipation, or diarrhea. She has no urinary complaints. Patient offers no further specific complaints today.  REVIEW OF SYSTEMS:   Review of Systems  Constitutional: Negative.  Negative for fever, malaise/fatigue and weight loss.  HENT: Negative for congestion.   Respiratory: Negative for cough and shortness of breath.   Cardiovascular: Negative.  Negative for chest pain and leg swelling.  Gastrointestinal: Negative.  Negative for abdominal pain.  Genitourinary: Negative.   Musculoskeletal: Negative.   Skin: Negative.  Negative for rash.  Neurological: Negative.  Negative for sensory change and weakness.  Psychiatric/Behavioral: The patient is nervous/anxious.     As per HPI. Otherwise, a complete review of systems is negative.  PAST MEDICAL HISTORY: Past Medical History:  Diagnosis Date  . Anxiety   . Cervical cancer ([place])    hx LEEP over 18 years ago.   . Cervical intraepithelial neoplasia I   . Chronic lymphocytic leukemia ([place])   . Depression   . Eating disorder   . History of self-harm   . Insomnia   . Obsession   . Pap smear abnormality of cervix with LGSIL   . Tobacco abuse   . Vitamin B12 deficiency (non anemic)     PAST SURGICAL HISTORY: Past Surgical History:  Procedure Laterality Date  . CERVICAL BIOPSY  W/ LOOP ELECTRODE EXCISION      . TUBAL LIGATION      FAMILY HISTORY: Family History  Problem Relation Age of Onset  . Cancer Mother        thyroid  . Alcohol abuse Father   . Depression Sister   . Cancer Sister        cevical  . Alcohol abuse Brother   . Depression Brother   . Bipolar disorder Brother   . ADD / ADHD Son     ADVANCED DIRECTIVES (Y/N):  N  HEALTH MAINTENANCE: Social History  Substance Use Topics  . Smoking status: Current Every Day Smoker    Packs/day: 1.00    Years: 30.00    Start date: 09/25/1986  . Smokeless tobacco: Never Used  . Alcohol use 0.0 oz/week     Comment: rarely     Colonoscopy:  PAP:  Bone density:  Lipid panel:  No Known Allergies  Current Outpatient Prescriptions  Medication Sig Dispense Refill  . cyclobenzaprine (FLEXERIL) 10 MG tablet Take 1 tablet by mouth 2 (two) times daily.    Marland Kitchen HYDROcodone-acetaminophen (NORCO/VICODIN) 5-325 MG tablet Take 1 tablet by mouth as needed.    . meloxicam (MOBIC) 15 MG tablet      No current facility-administered medications for this visit.     OBJECTIVE: Vitals:   07/04/17 1017  BP: (!) 92/53  Pulse: 69  Temp: (!) 96.7 F (35.9 C)     Body mass index is 23.69 kg/m.    ECOG FS:0 - Asymptomatic  General: Well-developed, well-nourished, no acute distress. Eyes: Pink conjunctiva,  anicteric sclera. Lungs: Clear to auscultation bilaterally. Heart: Regular rate and rhythm. No rubs, murmurs, or gallops. Abdomen: Soft, nontender, nondistended. No organomegaly noted, normoactive bowel sounds. Musculoskeletal: No edema, cyanosis, or clubbing. Neuro: Alert, answering all questions appropriately. Cranial nerves grossly intact. Skin: No rashes or petechiae noted. Psych: Normal affect. Lymphatics: Minimally palpable bilateral cervical lymphadenopathy. No other lymphadenopathy palpated.  LAB RESULTS:  Lab Results  Component Value Date   NA 138 04/20/2017   K 4.3 04/20/2017   CL 106 04/20/2017   CO2 26 04/20/2017    GLUCOSE 92 04/20/2017   BUN 21 (H) 04/20/2017   CREATININE 0.94 04/20/2017   CALCIUM 8.9 04/20/2017   PROT 6.6 09/23/2016   ALBUMIN 3.9 09/23/2016   AST 22 09/23/2016   ALT 16 09/23/2016   ALKPHOS 49 09/23/2016   BILITOT 0.5 09/23/2016   GFRNONAA >60 04/20/2017   GFRAA >60 04/20/2017    Lab Results  Component Value Date   WBC 12.4 (H) 04/30/2017   NEUTROABS 4.5 04/30/2017   HGB 14.1 04/30/2017   HCT 41.0 04/30/2017   MCV 95.2 04/30/2017   PLT 284 04/30/2017     STUDIES: Ct Soft Tissue Neck W Contrast  Result Date: 07/03/2017 CLINICAL DATA:  CLL follow-up.  Palpable left neck nodules. EXAM: CT NECK WITH CONTRAST TECHNIQUE: Multidetector CT imaging of the neck was performed using the standard protocol following the bolus administration of intravenous contrast. CONTRAST:  1104mL ISOVUE-300 IOPAMIDOL (ISOVUE-300) INJECTION 61% COMPARISON:  None. FINDINGS: Pharynx and larynx: No thickening of Waldeyer's ring. No masslike enhancement. Salivary glands: Normal Thyroid: Normal Lymph nodes: Diffuse mildly enlarged homogeneously enhancing jugular chain and posterior triangle lymph nodes extending to the supraclavicular fossae. This is the baseline scan. Left jugulodigastric node measures 16 mm in short axis. A right upper jugular chain lymph node measured on sagittal reformats, 7:70, measures 26 mm in length. Vascular: Major vessels are patent. Limited intracranial: Negative Visualized orbits: Negative Mastoids and visualized paranasal sinuses: Clear Skeleton: No acute or aggressive finding. Upper chest: Mild emphysematous change in the apical lungs. Axillary, mediastinal, and supraclavicular nodes as visualized on contemporaneous chest CT. IMPRESSION: Mild bilateral cervical adenopathy correlating with history of CLL. This is a baseline neck CT. Chest nodes are re- staged on dedicated study and report. Electronically Signed   By: Monte Fantasia M.D.   On: 07/03/2017 10:21   Ct Chest W  Contrast  Result Date: 07/03/2017 CLINICAL DATA:  Followup chronic lymphocytic leukemia. Status post chemotherapy. Restaging. EXAM: CT CHEST, ABDOMEN, AND PELVIS WITH CONTRAST TECHNIQUE: Multidetector CT imaging of the chest, abdomen and pelvis was performed following the standard protocol during bolus administration of intravenous contrast. CONTRAST:  134mL ISOVUE-300 IOPAMIDOL (ISOVUE-300) INJECTION 61% COMPARISON:  04/28/2017 FINDINGS: CT CHEST FINDINGS Cardiovascular: No acute findings. Aortic atherosclerosis. Mediastinum/Lymph Nodes: Mild mediastinal, hilar, and bilateral axillary lymphadenopathy shows no significant interval change. Index lymph in the left axilla measures 1.7 cm on image 16/3, compared with 17 mm on previous study. No new or increased lymphadenopathy identified within the thorax. Lungs/Pleura: Mild emphysema again noted. No evidence of pulmonary infiltrate or pleural effusion. No suspicious pulmonary nodules or masses identified. Musculoskeletal:  No suspicious bone lesions identified. CT ABDOMEN AND PELVIS FINDINGS Hepatobiliary: No masses identified. Probable tiny sub-cm cyst in the posterior right hepatic lobe shows no significant change. Gallbladder is unremarkable. Pancreas:  No mass or inflammatory changes. Spleen:  Within normal limits in size and appearance. Adrenals/Urinary tract:  No masses or hydronephrosis. Stomach/Bowel: No evidence of  obstruction, inflammatory process, or abnormal fluid collections. Vascular/Lymphatic: Lymphadenopathy again seen throughout the abdomen and pelvis, without significant interval change. Index lymph node in the portacaval space measures 2.0 cm in short axis on image 63/3, compared to 1.9 cm previously. Index lymph node in the left paraaortic region 4.0 cm short axis on image 82/3 compared to 4.0 cm previously. Index lymph node in the left external iliac chain measures 2.4 cm on image 113/3 compared to 2.3 cm previously. Index lymph node in the  right external iliac chain measures 2.3 cm on image 112/3 compared to 2.3 cm previously. No new or increased sites of lymphadenopathy identified. No evidence of abdominal aortic aneurysm. Reproductive:  No mass or other significant abnormality identified. Other:  None. Musculoskeletal:  No suspicious bone lesions identified. IMPRESSION: No significant change in lymphadenopathy throughout the chest, abdomen, and pelvis. No new or progressive disease identified. Electronically Signed   By: Earle Gell M.D.   On: 07/03/2017 13:16   Ct Abdomen Pelvis W Contrast  Result Date: 07/03/2017 CLINICAL DATA:  Followup chronic lymphocytic leukemia. Status post chemotherapy. Restaging. EXAM: CT CHEST, ABDOMEN, AND PELVIS WITH CONTRAST TECHNIQUE: Multidetector CT imaging of the chest, abdomen and pelvis was performed following the standard protocol during bolus administration of intravenous contrast. CONTRAST:  126mL ISOVUE-300 IOPAMIDOL (ISOVUE-300) INJECTION 61% COMPARISON:  04/28/2017 FINDINGS: CT CHEST FINDINGS Cardiovascular: No acute findings. Aortic atherosclerosis. Mediastinum/Lymph Nodes: Mild mediastinal, hilar, and bilateral axillary lymphadenopathy shows no significant interval change. Index lymph in the left axilla measures 1.7 cm on image 16/3, compared with 17 mm on previous study. No new or increased lymphadenopathy identified within the thorax. Lungs/Pleura: Mild emphysema again noted. No evidence of pulmonary infiltrate or pleural effusion. No suspicious pulmonary nodules or masses identified. Musculoskeletal:  No suspicious bone lesions identified. CT ABDOMEN AND PELVIS FINDINGS Hepatobiliary: No masses identified. Probable tiny sub-cm cyst in the posterior right hepatic lobe shows no significant change. Gallbladder is unremarkable. Pancreas:  No mass or inflammatory changes. Spleen:  Within normal limits in size and appearance. Adrenals/Urinary tract:  No masses or hydronephrosis. Stomach/Bowel: No  evidence of obstruction, inflammatory process, or abnormal fluid collections. Vascular/Lymphatic: Lymphadenopathy again seen throughout the abdomen and pelvis, without significant interval change. Index lymph node in the portacaval space measures 2.0 cm in short axis on image 63/3, compared to 1.9 cm previously. Index lymph node in the left paraaortic region 4.0 cm short axis on image 82/3 compared to 4.0 cm previously. Index lymph node in the left external iliac chain measures 2.4 cm on image 113/3 compared to 2.3 cm previously. Index lymph node in the right external iliac chain measures 2.3 cm on image 112/3 compared to 2.3 cm previously. No new or increased sites of lymphadenopathy identified. No evidence of abdominal aortic aneurysm. Reproductive:  No mass or other significant abnormality identified. Other:  None. Musculoskeletal:  No suspicious bone lesions identified. IMPRESSION: No significant change in lymphadenopathy throughout the chest, abdomen, and pelvis. No new or progressive disease identified. Electronically Signed   By: Earle Gell M.D.   On: 07/03/2017 13:16    ASSESSMENT: CLL, Rai stage 1  PLAN:    1. CLL: Confirmed by peripheral blood flow cytometry. CT scan results from June 25, 2017 reviewed independently and reported as above revealed minimal change in patient's known lymphadenopathy. She completed cycle 4 of weekly Rituxan on February 20, 2017. No intervention is needed at this time. She likely will require more aggressive treatment in the future possibly using  Treanda plus Rituxan, but this is not necessary at this point. Return to clinic in 3 months with repeat imaging and further evaluation.  2. Lymphadenopathy: Secondary CLL. Patient does not require further ENT follow-up for biopsy.  3. Cough/congestion: Resolved.  Approximately 30 minutes was spent in discussion of which greater than 50% was consultation.  Patient expressed understanding and was in agreement with this plan.  She also understands that She can call clinic at any time with any questions, concerns, or complaints.    Lloyd Huger, MD   07/04/2017 1:43 PM

## 2017-07-02 DIAGNOSIS — M545 Low back pain: Secondary | ICD-10-CM | POA: Diagnosis not present

## 2017-07-02 DIAGNOSIS — M5416 Radiculopathy, lumbar region: Secondary | ICD-10-CM | POA: Diagnosis not present

## 2017-07-03 ENCOUNTER — Ambulatory Visit
Admission: RE | Admit: 2017-07-03 | Discharge: 2017-07-03 | Disposition: A | Discharge status: Home / Self Care | Payer: BLUE CROSS/BLUE SHIELD | Source: Ambulatory Visit | Attending: Oncology | Admitting: Oncology

## 2017-07-03 ENCOUNTER — Other Ambulatory Visit: Payer: Self-pay | Admitting: *Deleted

## 2017-07-03 DIAGNOSIS — R591 Generalized enlarged lymph nodes: Secondary | ICD-10-CM | POA: Insufficient documentation

## 2017-07-03 DIAGNOSIS — C9110 Chronic lymphocytic leukemia of B-cell type not having achieved remission: Secondary | ICD-10-CM

## 2017-07-03 DIAGNOSIS — R221 Localized swelling, mass and lump, neck: Secondary | ICD-10-CM | POA: Diagnosis not present

## 2017-07-03 HISTORY — DX: Chronic lymphocytic leukemia of B-cell type not having achieved remission: C91.10

## 2017-07-03 MED ORDER — IOPAMIDOL (ISOVUE-300) INJECTION 61%
100.0000 mL | Freq: Once | INTRAVENOUS | Status: AC | PRN
  Administered 2017-07-03: 100 mL via INTRAVENOUS

## 2017-07-04 ENCOUNTER — Inpatient Hospital Stay: Payer: BLUE CROSS/BLUE SHIELD | Attending: Oncology | Admitting: Oncology

## 2017-07-04 VITALS — BP 92/53 | HR 69 | Temp 96.7°F | Wt 151.2 lb

## 2017-07-04 DIAGNOSIS — F509 Eating disorder, unspecified: Secondary | ICD-10-CM

## 2017-07-04 DIAGNOSIS — F17210 Nicotine dependence, cigarettes, uncomplicated: Secondary | ICD-10-CM | POA: Diagnosis not present

## 2017-07-04 DIAGNOSIS — Z808 Family history of malignant neoplasm of other organs or systems: Secondary | ICD-10-CM

## 2017-07-04 DIAGNOSIS — F329 Major depressive disorder, single episode, unspecified: Secondary | ICD-10-CM | POA: Diagnosis not present

## 2017-07-04 DIAGNOSIS — C9110 Chronic lymphocytic leukemia of B-cell type not having achieved remission: Secondary | ICD-10-CM

## 2017-07-04 DIAGNOSIS — F439 Reaction to severe stress, unspecified: Secondary | ICD-10-CM | POA: Insufficient documentation

## 2017-07-04 DIAGNOSIS — I700 Atherosclerosis of aorta: Secondary | ICD-10-CM | POA: Diagnosis not present

## 2017-07-04 DIAGNOSIS — Z8541 Personal history of malignant neoplasm of cervix uteri: Secondary | ICD-10-CM

## 2017-07-04 DIAGNOSIS — F419 Anxiety disorder, unspecified: Secondary | ICD-10-CM | POA: Insufficient documentation

## 2017-07-04 DIAGNOSIS — E538 Deficiency of other specified B group vitamins: Secondary | ICD-10-CM | POA: Diagnosis not present

## 2017-07-04 DIAGNOSIS — Z8049 Family history of malignant neoplasm of other genital organs: Secondary | ICD-10-CM | POA: Diagnosis not present

## 2017-07-04 DIAGNOSIS — C9190 Lymphoid leukemia, unspecified not having achieved remission: Secondary | ICD-10-CM | POA: Diagnosis not present

## 2017-07-04 DIAGNOSIS — Z79899 Other long term (current) drug therapy: Secondary | ICD-10-CM | POA: Diagnosis not present

## 2017-07-04 DIAGNOSIS — Z915 Personal history of self-harm: Secondary | ICD-10-CM | POA: Diagnosis not present

## 2017-07-04 DIAGNOSIS — R590 Localized enlarged lymph nodes: Secondary | ICD-10-CM | POA: Diagnosis not present

## 2017-07-04 DIAGNOSIS — G4700 Insomnia, unspecified: Secondary | ICD-10-CM | POA: Diagnosis not present

## 2017-07-04 NOTE — Progress Notes (Signed)
Patient here today for CT results. 

## 2017-07-10 DIAGNOSIS — M545 Low back pain: Secondary | ICD-10-CM | POA: Diagnosis not present

## 2017-07-10 DIAGNOSIS — M546 Pain in thoracic spine: Secondary | ICD-10-CM | POA: Diagnosis not present

## 2017-07-25 ENCOUNTER — Ambulatory Visit: Payer: BLUE CROSS/BLUE SHIELD

## 2017-07-30 ENCOUNTER — Ambulatory Visit: Payer: BLUE CROSS/BLUE SHIELD | Admitting: Oncology

## 2017-08-09 ENCOUNTER — Other Ambulatory Visit: Payer: Self-pay

## 2017-08-09 ENCOUNTER — Emergency Department: Payer: BLUE CROSS/BLUE SHIELD

## 2017-08-09 ENCOUNTER — Emergency Department
Admission: EM | Admit: 2017-08-09 | Discharge: 2017-08-09 | Disposition: A | Discharge status: Home / Self Care | Payer: BLUE CROSS/BLUE SHIELD | Attending: Emergency Medicine | Admitting: Emergency Medicine

## 2017-08-09 ENCOUNTER — Encounter: Payer: Self-pay | Admitting: Emergency Medicine

## 2017-08-09 DIAGNOSIS — B9789 Other viral agents as the cause of diseases classified elsewhere: Secondary | ICD-10-CM | POA: Insufficient documentation

## 2017-08-09 DIAGNOSIS — J069 Acute upper respiratory infection, unspecified: Secondary | ICD-10-CM | POA: Diagnosis not present

## 2017-08-09 DIAGNOSIS — R6883 Chills (without fever): Secondary | ICD-10-CM | POA: Insufficient documentation

## 2017-08-09 DIAGNOSIS — Z79899 Other long term (current) drug therapy: Secondary | ICD-10-CM | POA: Insufficient documentation

## 2017-08-09 DIAGNOSIS — Z8541 Personal history of malignant neoplasm of cervix uteri: Secondary | ICD-10-CM | POA: Insufficient documentation

## 2017-08-09 DIAGNOSIS — F17200 Nicotine dependence, unspecified, uncomplicated: Secondary | ICD-10-CM | POA: Diagnosis not present

## 2017-08-09 DIAGNOSIS — F17210 Nicotine dependence, cigarettes, uncomplicated: Secondary | ICD-10-CM | POA: Diagnosis not present

## 2017-08-09 DIAGNOSIS — Z856 Personal history of leukemia: Secondary | ICD-10-CM | POA: Diagnosis not present

## 2017-08-09 DIAGNOSIS — R590 Localized enlarged lymph nodes: Secondary | ICD-10-CM | POA: Insufficient documentation

## 2017-08-09 DIAGNOSIS — R05 Cough: Secondary | ICD-10-CM | POA: Diagnosis not present

## 2017-08-09 MED ORDER — AZITHROMYCIN 250 MG PO TABS: ORAL_TABLET | ORAL | 0 refills | Status: DC

## 2017-08-09 MED ORDER — HYDROCOD POLST-CPM POLST ER 10-8 MG/5ML PO SUER: 5.0000 mL | Freq: Two times a day (BID) | ORAL | 0 refills | Status: DC | PRN

## 2017-08-09 NOTE — ED Provider Notes (Signed)
[facility] Emergency Department Provider Note  ____________________________________________  Time seen: Approximately 10:05 AM  I have reviewed the triage vital signs and the nursing notes.   HISTORY  Chief Complaint Cough   HPI Jasiel Apachito is a 46 y.o. female who presents emergency department for evaluation of cough, chills, and hoarseness for the past week.  She has taken Robitussin without relief.  Patient is a CLL patient.  She denies fever or shortness of breath.  Past Medical History:  Diagnosis Date  . Anxiety   . Cervical cancer ([place])    hx LEEP over 18 years ago.   . Cervical intraepithelial neoplasia I   . Chronic lymphocytic leukemia ([place])   . Depression   . Eating disorder   . History of self-harm   . Insomnia   . Obsession   . Pap smear abnormality of cervix with LGSIL   . Tobacco abuse   . Vitamin B12 deficiency (non anemic)     Patient Active Problem List   Diagnosis Date Noted  . GERD without esophagitis 12/11/2016  . CLL (chronic lymphocytic leukemia) ([place]) 11/24/2016  . Pap smear abnormality of cervix/human papillomavirus (HPV) positive 09/12/2016  . Stress incontinence 09/04/2016  . B12 deficiency 07/10/2015  . Insomnia, persistent 07/10/2015  . Mild episode of recurrent major depressive disorder ([place]) 07/10/2015  . Anorexia nervosa, restricting type 07/10/2015  . Anxiety, generalized 07/10/2015  . H/O suicide attempt 07/10/2015  . Lymphocytosis 07/10/2015  . Obsessive-compulsive disorder 07/10/2015  . Tobacco use 07/10/2015  . History of cervical dysplasia 07/18/2014    Past Surgical History:  Procedure Laterality Date  . CERVICAL BIOPSY  W/ LOOP ELECTRODE EXCISION    . TUBAL LIGATION      Prior to Admission medications   Medication Sig Start Date End Date Taking? Authorizing Provider  azithromycin (ZITHROMAX) 250 MG tablet 2 tablets today, then 1 tablet for the next 4 days. 08/09/17   Ruta Capece, Johnette Abraham B, FNP   chlorpheniramine-HYDROcodone (TUSSIONEX PENNKINETIC ER) 10-8 MG/5ML SUER Take 5 mLs by mouth every 12 (twelve) hours as needed for cough. 08/09/17   Kandra Graven, Johnette Abraham B, FNP  cyclobenzaprine (FLEXERIL) 10 MG tablet Take 1 tablet by mouth 2 (two) times daily.    [provider]  HYDROcodone-acetaminophen (NORCO/VICODIN) 5-325 MG tablet Take 1 tablet by mouth as needed.    [provider]  meloxicam (MOBIC) 15 MG tablet  12/11/16   [provider]    Allergies Patient has no known allergies.  Family History  Problem Relation Age of Onset  . Cancer Mother        thyroid  . Alcohol abuse Father   . Depression Sister   . Cancer Sister        cevical  . Alcohol abuse Brother   . Depression Brother   . Bipolar disorder Brother   . ADD / ADHD Son     Social History Social History   Tobacco Use  . Smoking status: Current Every Day Smoker    Packs/day: 1.00    Years: 30.00    Pack years: 30.00    Start date: 09/25/1986  . Smokeless tobacco: Never Used  Substance Use Topics  . Alcohol use: Yes    Alcohol/week: 0.0 oz    Comment: rarely  . Drug use: No    Review of Systems Constitutional: Negative for fever/positive for chills ENT: Negative for sore throat. Cardiovascular: Denies chest pain. Respiratory: Negative for shortness of breath.  Positive for  cough. Gastrointestinal: Negative for nausea, negative for vomiting.  No diarrhea.  Musculoskeletal: Negative for body aches Skin: Negative for rash. Neurological: Negative for headaches ____________________________________________   PHYSICAL EXAM:  VITAL SIGNS: ED Triage Vitals  Enc Vitals Group     BP 08/09/17 1000 114/77     Pulse Rate 08/09/17 1000 90     Resp 08/09/17 1000 20     Temp 08/09/17 1000 98.2 F (36.8 C)     Temp Source 08/09/17 1000 Oral     SpO2 08/09/17 1000 99 %     Weight 08/09/17 1000 150 lb (68 kg)     Height 08/09/17 1000 5\' 7"  (1.702 m)     Head Circumference --       Peak Flow --      Pain Score 08/09/17 0959 3     Pain Loc --      Pain Edu? --      Excl. in Early? --     Constitutional: Alert and oriented.  Acutely ill appearing and in no acute distress. Eyes: Conjunctivae are normal. EOMI. Nose: Sinus congestion noted; clear rhinnorhea. Mouth/Throat: Mucous membranes are moist.  Oropharynx mildly erythematous. Tonsils 1+ without exudate. Neck: No stridor.  Lymphatic: Bilateral anterior cervical lymphadenopathy. Cardiovascular: Normal rate, regular rhythm. Good peripheral circulation. Respiratory: Normal respiratory effort.  No retractions.  Breath sounds clear to auscultation throughout. Gastrointestinal: Soft and nontender.  Musculoskeletal: FROM x 4 extremities.  Neurologic:  Normal speech and language.  Skin:  Skin is warm, dry and intact. No rash noted. Psychiatric: Mood and affect are normal. Speech and behavior are normal.  ____________________________________________   LABS (all labs ordered are listed, but only abnormal results are displayed)  Labs Reviewed - No data to display ____________________________________________  EKG  Not indicated ____________________________________________  RADIOLOGY  Chest x-ray negative for acute cardiopulmonary abnormality per radiology. ____________________________________________   PROCEDURES  Procedure(s) performed: None  Critical Care performed: No ____________________________________________   INITIAL IMPRESSION / ASSESSMENT AND PLAN / ED COURSE  46 year old female presenting to the emergency department for evaluation and treatment of cough and URI symptoms for the past week.  Chest x-ray was negative.  She will be given a prescription for azithromycin and Tussionex.  She was encouraged to follow-up with her primary care provider in 1 week.  She is to return to the emergency department for any symptoms of concern if unable to see her primary care.  Pertinent labs & imaging results  that were available during my care of the patient were reviewed by me and considered in my medical decision making (see chart for details).  If controlled substance prescribed during this visit, 12 month history viewed on the [place] prior to issuing an initial prescription for Schedule II or III opiod. ____________________________________________   FINAL CLINICAL IMPRESSION(S) / ED DIAGNOSES  Final diagnoses:  Viral URI with cough    Note:  This document was prepared using Dragon voice recognition software and may include unintentional dictation errors.     Victorino Dike, FNP 08/09/17 1238    Harvest Dark, MD 08/09/17 1443

## 2017-08-09 NOTE — Discharge Instructions (Signed)
Please follow up with your primary care provider in a week or sooner if feeling worse.  Return to the ER for symptoms that change or worsen if you are unable to schedule an appointment.

## 2017-08-09 NOTE — ED Triage Notes (Signed)
Cough and losing voice, chills x 1 week.

## 2017-08-20 ENCOUNTER — Ambulatory Visit (INDEPENDENT_AMBULATORY_CARE_PROVIDER_SITE_OTHER): Payer: BLUE CROSS/BLUE SHIELD | Admitting: Family Medicine

## 2017-08-20 ENCOUNTER — Encounter: Payer: Self-pay | Admitting: Family Medicine

## 2017-08-20 VITALS — BP 120/90 | HR 86 | Resp 14 | Ht 67.0 in

## 2017-08-20 DIAGNOSIS — M5410 Radiculopathy, site unspecified: Secondary | ICD-10-CM

## 2017-08-20 DIAGNOSIS — J439 Emphysema, unspecified: Secondary | ICD-10-CM | POA: Diagnosis not present

## 2017-08-20 DIAGNOSIS — G500 Trigeminal neuralgia: Secondary | ICD-10-CM

## 2017-08-20 DIAGNOSIS — I700 Atherosclerosis of aorta: Secondary | ICD-10-CM | POA: Diagnosis not present

## 2017-08-20 MED ORDER — BENZONATATE 100 MG PO CAPS: 100.0000 mg | ORAL_CAPSULE | Freq: Three times a day (TID) | ORAL | 0 refills | Status: DC | PRN

## 2017-08-20 MED ORDER — CARBAMAZEPINE 200 MG PO TABS: 200.0000 mg | ORAL_TABLET | Freq: Three times a day (TID) | ORAL | 0 refills | Status: DC

## 2017-08-20 MED ORDER — UMECLIDINIUM-VILANTEROL 62.5-25 MCG/INH IN AEPB: 1.0000 | INHALATION_SPRAY | Freq: Every day | RESPIRATORY_TRACT | 0 refills | Status: DC

## 2017-08-20 NOTE — Patient Instructions (Signed)
Trigeminal Neuralgia Trigeminal neuralgia is a nerve disorder that causes attacks of severe facial pain. The attacks last from a few seconds to several minutes. They can happen for days, weeks, or months and then go away for months or years. Trigeminal neuralgia is also called tic douloureux. What are the causes? This condition is caused by damage to a nerve in the face that is called the trigeminal nerve. An attack can be triggered by:  Talking.  Chewing.  Putting on makeup.  Washing your face.  Shaving your face.  Brushing your teeth.  Touching your face.  What increases the risk? This condition is more likely to develop in:  Women.  People who are 50 years of age or older.  What are the signs or symptoms? The main symptom of this condition is pain in the jaw, lips, eyes, nose, scalp, forehead, and face. The pain may be intense, stabbing, electric, or shock-like. How is this diagnosed? This condition is diagnosed with a physical exam. A CT scan or MRI may be done to rule out other conditions that can cause facial pain. How is this treated? This condition may be treated with:  Avoiding the things that trigger your attacks.  Pain medicine.  Surgery. This may be done in severe cases if other medical treatment does not provide relief.  Follow these instructions at home:  Take over-the-counter and prescription medicines only as told by your health care provider.  If you wish to get pregnant, talk with your health care provider before you start trying to get pregnant.  Avoid the things that trigger your attacks. It may help to: ? Chew on the unaffected side of your mouth. ? Avoid touching your face. ? Avoid blasts of hot or cold air. Contact a health care provider if:  Your pain medicine is not helping.  You develop new, unexplained symptoms, such as: ? Double vision. ? Facial weakness. ? Changes in hearing or balance.  You become pregnant. Get help right away  if:  Your pain is unbearable, and your pain medicine does not help. This information is not intended to replace advice given to you by your health care provider. Make sure you discuss any questions you have with your health care provider. Document Released: 08/30/2000 Document Revised: 05/05/2016 Document Reviewed: 12/26/2014 Elsevier Interactive Patient Education  2018 [employer]  

## 2017-08-20 NOTE — Progress Notes (Signed)
Name: Monique [name]   MRN: [id number]    DOB: 1971/02/26   Date:08/20/2017       Progress Note  Subjective  Chief Complaint  Chief Complaint  Patient presents with  . Oral Pain    HPI  Facial pain/numbness: she noticed some intermittent pain/burning/numbness on right lower teeth, face/right side of chin and jaw over the past month, but getting progressively worse and now more painful and at times intolerable. She states pressure to the area makes it worse. It makes her grimace at times and co-workers are worried because of her constant pain and thinking she is having a stroke. She denies any change in appetite , she has a history of anorexia but has been eating. No weakness and the only other symptom of tingling is from her back - causing radiculitis and seen by Ortho   Elevated bp: she is not taking medication, she is in pain today, advised to monitor and return sooner if needed. No chest pain or palpitation  Emphysema: she is not ready to quit smoking, she has a daily cough, mostly dry, no wheezing or current SOB. She had a CT chest ordered by oncologist for evaluation of CLL and was found to have emphysema and also atherosclerosis of aorta. Discussed results with patient today. She asked for refill of Tussionex but explained not indicated for this , we will give her Anoro and a short course of Benzonate for now  Patient Active Problem List   Diagnosis Date Noted  . Thoracic aortic atherosclerosis ([place]) 08/20/2017  . Emphysema of lung ([place]) 08/20/2017  . Low back pain 07/02/2017  . Adductor tendinitis 04/23/2017  . Trochanteric bursitis of right hip 04/23/2017  . GERD without esophagitis 12/11/2016  . CLL (chronic lymphocytic leukemia) ([place]) 11/24/2016  . Pap smear abnormality of cervix/human papillomavirus (HPV) positive 09/12/2016  . Stress incontinence 09/04/2016  . B12 deficiency 07/10/2015  . Insomnia, persistent 07/10/2015  . Mild episode of recurrent major depressive  disorder ([place]) 07/10/2015  . Anorexia nervosa, restricting type 07/10/2015  . Anxiety, generalized 07/10/2015  . H/O suicide attempt 07/10/2015  . Lymphocytosis 07/10/2015  . Obsessive-compulsive disorder 07/10/2015  . Tobacco use 07/10/2015  . History of cervical dysplasia 07/18/2014    Past Surgical History:  Procedure Laterality Date  . CERVICAL BIOPSY  W/ LOOP ELECTRODE EXCISION    . TUBAL LIGATION      Family History  Problem Relation Age of Onset  . Cancer Mother        thyroid  . Alcohol abuse Father   . Depression Sister   . Cancer Sister        cevical  . Alcohol abuse Brother   . Depression Brother   . Bipolar disorder Brother   . ADD / ADHD Son     Social History   Socioeconomic History  . Marital status: Divorced    Spouse name: Not on file  . Number of children: Not on file  . Years of education: Not on file  . Highest education level: Not on file  Social Needs  . Financial resource strain: Not on file  . Food insecurity - worry: Not on file  . Food insecurity - inability: Not on file  . Transportation needs - medical: Not on file  . Transportation needs - non-medical: Not on file  Occupational History  . Not on file  Tobacco Use  . Smoking status: Current Every Day Smoker    Packs/day: 1.00    Years:  30.00    Pack years: 30.00    Start date: 09/25/1986  . Smokeless tobacco: Never Used  Substance and Sexual Activity  . Alcohol use: Yes    Alcohol/week: 0.0 oz    Comment: rarely  . Drug use: No  . Sexual activity: Yes    Partners: Male    Birth control/protection: None  Other Topics Concern  . Not on file  Social History Narrative  . Not on file     Current Outpatient Medications:  .  benzonatate (TESSALON) 100 MG capsule, Take 1-2 capsules (100-200 mg total) by mouth 3 (three) times daily as needed., Disp: 40 capsule, Rfl: 0 .  carbamazepine (TEGRETOL) 200 MG tablet, Take 1 tablet (200 mg total) by mouth 3 (three) times daily., Disp:  90 tablet, Rfl: 0 .  cyclobenzaprine (FLEXERIL) 10 MG tablet, Take 1 tablet by mouth 2 (two) times daily., Disp: , Rfl:  .  meloxicam (MOBIC) 15 MG tablet, , Disp: , Rfl:  .  umeclidinium-vilanterol (ANORO ELLIPTA) 62.5-25 MCG/INH AEPB, Inhale 1 puff into the lungs daily., Disp: 60 each, Rfl: 0  No Known Allergies   ROS  Ten systems reviewed and is negative except as mentioned in HPI   Objective  Vitals:   08/20/17 1137  BP: 120/90  Pulse: 86  Resp: 14  SpO2: 98%  Height: 5\' 7"  (1.702 m)    Body mass index is 23.49 kg/m.   Refused to get her weight today   Physical Exam  Constitutional: Patient appears well-developed and well-nourished.  No distress.  HEENT: head atraumatic, normocephalic, pupils equal and reactive to light, , neck supple, throat within normal limits Cardiovascular: Normal rate, regular rhythm and normal heart sounds.  No murmur heard. No BLE edema. Pulmonary/Chest: Effort normal and breath sounds normal. No respiratory distress. Abdominal: Soft.  There is no tenderness.   Psychiatric: Patient has a normal mood and affect. behavior is normal. Judgment and thought content normal. Neurological exam: normal cranial nerves, hyperalgesia on inferior branch of right trigeminal nerve.   PHQ2/9: Depression screen [facility] 2/9 12/11/2016 09/04/2016 08/14/2016 01/03/2016 07/12/2015  Decreased Interest 0 0 1 0 0  Down, Depressed, Hopeless 1 0 1 0 1  PHQ - 2 Score 1 0 2 0 1  Altered sleeping - 0 1 - -  Tired, decreased energy - 0 0 - -  Change in appetite - 0 0 - -  Feeling bad or failure about yourself  - 0 0 - -  Trouble concentrating - 0 0 - -  Moving slowly or fidgety/restless - 0 0 - -  Suicidal thoughts - 0 0 - -  PHQ-9 Score - 0 3 - -  Difficult doing work/chores - Not difficult at all Somewhat difficult - -     Fall Risk: Fall Risk  08/20/2017 12/11/2016 09/04/2016 08/14/2016 01/03/2016  Falls in the past year? No No No No No    Functional Status  Survey: Is the patient deaf or have difficulty hearing?: No Does the patient have difficulty seeing, even when wearing glasses/contacts?: No Does the patient have difficulty concentrating, remembering, or making decisions?: No Does the patient have difficulty walking or climbing stairs?: No Does the patient have difficulty dressing or bathing?: No Does the patient have difficulty doing errands alone such as visiting a doctor's office or shopping?: No   Assessment & Plan  1. Trigeminal neuralgia of right side of face  Discussed likely the cause of her symptoms - carbamazepine (TEGRETOL) 200 MG  tablet; Take 1 tablet (200 mg total) by mouth 3 (three) times daily.  Dispense: 90 tablet; Refill: 0  2. Radiculitis of leg  She was seen by [facility] but did not finish PT, had a problem with billing department , we will send her to [facility] PT - Ambulatory referral to Physical Therapy  3. Thoracic aortic atherosclerosis ([place])  Discussed incidental finding on CT and statin therapy and bp control but patient wants to hold off on more medications at this time  4. Pulmonary emphysema, unspecified emphysema type ([place])  Based on CT chest, we will start Anoro since she still has a cough, and return in one month for spirometry  - benzonatate (TESSALON) 100 MG capsule; Take 1-2 capsules (100-200 mg total) by mouth 3 (three) times daily as needed.  Dispense: 40 capsule; Refill: 0 - umeclidinium-vilanterol (ANORO ELLIPTA) 62.5-25 MCG/INH AEPB; Inhale 1 puff into the lungs daily.  Dispense: 60 each; Refill: 0

## 2017-09-17 ENCOUNTER — Ambulatory Visit: Payer: BLUE CROSS/BLUE SHIELD

## 2017-09-17 ENCOUNTER — Other Ambulatory Visit: Payer: Self-pay | Admitting: Family Medicine

## 2017-09-17 DIAGNOSIS — J439 Emphysema, unspecified: Secondary | ICD-10-CM

## 2017-09-18 ENCOUNTER — Other Ambulatory Visit: Payer: Self-pay | Admitting: Family Medicine

## 2017-09-18 DIAGNOSIS — G500 Trigeminal neuralgia: Secondary | ICD-10-CM

## 2017-09-24 ENCOUNTER — Ambulatory Visit: Payer: BLUE CROSS/BLUE SHIELD | Admitting: Family Medicine

## 2017-09-28 NOTE — Progress Notes (Signed)
[facility]  [phone] [phone]  ID: Monique [name] OB: 16-Jan-1971  MR#: [id number]  BWL#:[id number]  Patient Care Team: [name], MD as PCP - General (Family Medicine)  CHIEF COMPLAINT: CLL.  INTERVAL HISTORY: Patient returns to clinic today for further evaluation and discussion of her imaging results. She continues to be anxious, but otherwise feels well. She has no neurologic complaints. She denies any recent fevers, illnesses, or weight loss. She has noted no new lymphadenopathy. She has no chest pain or shortness of breath. She denies any nausea, vomiting, constipation, or diarrhea. She has no urinary complaints. Patient offers no specific complaints today.  REVIEW OF SYSTEMS:   Review of Systems  Constitutional: Negative.  Negative for fever, malaise/fatigue and weight loss.  HENT: Negative for congestion.   Respiratory: Negative for cough and shortness of breath.   Cardiovascular: Negative.  Negative for chest pain and leg swelling.  Gastrointestinal: Negative.  Negative for abdominal pain.  Genitourinary: Negative.   Musculoskeletal: Negative.   Skin: Negative.  Negative for rash.  Neurological: Negative.  Negative for sensory change and weakness.  Psychiatric/Behavioral: The patient is nervous/anxious.     As per HPI. Otherwise, a complete review of systems is negative.  PAST MEDICAL HISTORY: Past Medical History:  Diagnosis Date  . Anxiety   . Cervical cancer ([place])    hx LEEP over 18 years ago.   . Cervical intraepithelial neoplasia I   . Chronic lymphocytic leukemia ([place])   . Depression   . Eating disorder   . History of self-harm   . Insomnia   . Obsession   . Pap smear abnormality of cervix with LGSIL   . Tobacco abuse   . Vitamin B12 deficiency (non anemic)     PAST SURGICAL HISTORY: Past Surgical History:  Procedure Laterality Date  . CERVICAL BIOPSY  W/ LOOP ELECTRODE EXCISION    . TUBAL LIGATION       FAMILY HISTORY: Family History  Problem Relation Age of Onset  . Cancer Mother        thyroid  . Alcohol abuse Father   . Depression Sister   . Cancer Sister        cevical  . Alcohol abuse Brother   . Depression Brother   . Bipolar disorder Brother   . ADD / ADHD Son     ADVANCED DIRECTIVES (Y/N):  N  HEALTH MAINTENANCE: Social History   Tobacco Use  . Smoking status: Current Every Day Smoker    Packs/day: 1.00    Years: 30.00    Pack years: 30.00    Start date: 09/25/1986  . Smokeless tobacco: Never Used  Substance Use Topics  . Alcohol use: Yes    Alcohol/week: 0.0 oz    Comment: rarely  . Drug use: No     Colonoscopy:  PAP:  Bone density:  Lipid panel:  No Known Allergies  Current Outpatient Medications  Medication Sig Dispense Refill  . traMADol (ULTRAM) 50 MG tablet Take by mouth every 6 (six) hours as needed.    . umeclidinium-vilanterol (ANORO ELLIPTA) 62.5-25 MCG/INH AEPB TAKE 1 PUFF BY MOUTH EVERY DAY 60 each 5  . varenicline (CHANTIX CONTINUING MONTH PAK) 1 MG tablet Take 1 tablet (1 mg total) by mouth 2 (two) times daily. Fill starting kit first 60 tablet 2  . varenicline (CHANTIX STARTING MONTH PAK) 0.5 MG X 11 & 1 MG X 42 tablet Take one 0.5 mg tablet by mouth once daily  for 3 days, then increase to one 0.5 mg tablet twice daily for 4 days, then increase to one 1 mg tablet twice daily. 53 tablet 0  . carbamazepine (TEGRETOL) 200 MG tablet TAKE 1 TABLET (200 MG TOTAL) BY MOUTH 3 (THREE) TIMES DAILY. (Patient not taking: Reported on 10/02/2017) 90 tablet 0  . meloxicam (MOBIC) 15 MG tablet      No current facility-administered medications for this visit.     OBJECTIVE: Vitals:   10/03/17 1015  BP: 101/69  Pulse: 74  Resp: 20  Temp: (!) 97.4 F (36.3 C)     Body mass index is 23.27 kg/m.    ECOG FS:0 - Asymptomatic  General: Well-developed, well-nourished, no acute distress. Eyes: Pink conjunctiva, anicteric sclera. Lungs: Clear to  auscultation bilaterally. Heart: Regular rate and rhythm. No rubs, murmurs, or gallops. Abdomen: Soft, nontender, nondistended. No organomegaly noted, normoactive bowel sounds. Musculoskeletal: No edema, cyanosis, or clubbing. Neuro: Alert, answering all questions appropriately. Cranial nerves grossly intact. Skin: No rashes or petechiae noted. Psych: Normal affect. Lymphatics: No palpable cervical lymphadenopathy. No other lymphadenopathy palpated.  LAB RESULTS:  Lab Results  Component Value Date   NA 134 (L) 10/01/2017   K 4.3 10/01/2017   CL 102 10/01/2017   CO2 25 10/01/2017   GLUCOSE 86 10/01/2017   BUN 13 10/01/2017   CREATININE 0.83 10/01/2017   CALCIUM 8.7 (L) 10/01/2017   PROT 6.5 10/01/2017   ALBUMIN 4.0 10/01/2017   AST 20 10/01/2017   ALT 16 10/01/2017   ALKPHOS 46 10/01/2017   BILITOT 0.6 10/01/2017   GFRNONAA >60 10/01/2017   GFRAA >60 10/01/2017    Lab Results  Component Value Date   WBC 32.7 (H) 10/01/2017   NEUTROABS 4.9 10/01/2017   HGB 13.8 10/01/2017   HCT 42.0 10/01/2017   MCV 97.8 10/01/2017   PLT 241 10/01/2017     STUDIES: Ct Soft Tissue Neck W Contrast  Result Date: 10/01/2017 CLINICAL DATA:  CLL EXAM: CT NECK WITH CONTRAST TECHNIQUE: Multidetector CT imaging of the neck was performed using the standard protocol following the bolus administration of intravenous contrast. CONTRAST:  151m ISOVUE-300 IOPAMIDOL (ISOVUE-300) INJECTION 61% COMPARISON:  CT neck 07/03/2017 FINDINGS: Pharynx and larynx: Normal. No mass or swelling. Salivary glands: Negative Thyroid: Negative Lymph nodes: Cervical adenopathy is stable. Multiple level 2, level 3, level 4, and level 5 nodes bilaterally are stable. Bilateral axillary adenopathy stable and reported separately on chest CT from today. Vascular: Negative Limited intracranial: Negative Visualized orbits: Bilateral cataract removal.  No orbital mass Mastoids and visualized paranasal sinuses: Negative Skeleton:  Negative Upper chest: Chest CT reported separately from today Other: None IMPRESSION: Cervical adenopathy compatible with CLL is stable since 07/03/2017. Electronically Signed   By: CFranchot GalloM.D.   On: 10/01/2017 12:52   Ct Chest W Contrast  Result Date: 10/01/2017 CLINICAL DATA:  Followup of chronic lymphocytic leukemia. EXAM: CT CHEST, ABDOMEN, AND PELVIS WITH CONTRAST TECHNIQUE: Multidetector CT imaging of the chest, abdomen and pelvis was performed following the standard protocol during bolus administration of intravenous contrast. CONTRAST:  1032mISOVUE-300 IOPAMIDOL (ISOVUE-300) INJECTION 61% COMPARISON:  07/03/2017 FINDINGS: CT CHEST FINDINGS Cardiovascular: Normal heart size, without pericardial effusion. No central pulmonary embolism, on this non-dedicated study. Mediastinum/Nodes: Bilateral axillary and subpectoral adenopathy again identified. Index left axillary node measures 1.7 x 3.3 cm today versus 1.7 x 2.8 cm on the prior. Index left subpectoral node measures 1.6 cm on image 10/series 4 versus 1.8 cm on  the prior. Right paratracheal node measures 1.1 cm on image 18/series 4 versus 1.0 cm on the prior. Subcarinal node at 1.7 cm on image 25/series 4 is similar 1.6 cm on the prior. Bilateral hilar adenopathy. Lungs/Pleura: No pleural fluid.  Mild centrilobular emphysema. Subpleural right middle lobe pulmonary nodule is similar, including at 5 mm. Thickening along the right minor fissure versus a subpleural lymph node, similar. Suspect mucoid impaction with calcification in the left lower lobe, including on image 92/series 5, similar. Musculoskeletal: No acute osseous abnormality. CT ABDOMEN PELVIS FINDINGS Hepatobiliary: Hepatomegaly at 18.9 cm craniocaudal. Normal gallbladder, without biliary ductal dilatation. Pancreas: Normal, without mass or ductal dilatation. Spleen: Normal appearance of the spleen. Suspect adenopathy within the splenic hilum, including at 1.2 cm on image 59/series 4.  Compare 1.1 cm on the prior. Adrenals/Urinary Tract: Normal right adrenal gland. Mild left adrenal thickening. Normal kidneys, without hydronephrosis. Normal urinary bladder. Stomach/Bowel: Normal stomach, without wall thickening. Normal colon, appendix, and terminal ileum. Normal small bowel. Vascular/Lymphatic: Aortic atherosclerosis. Bulky retroperitoneal adenopathy. Left periaortic nodal mass measures 3.9 cm today versus 4.0 cm on the prior. Small bowel mesenteric adenopathy at 2.2 cm on image 84/series 4, 2.1 cm previously. Marked bilateral pelvic sidewall adenopathy. Index left external iliac node measures 2.9 cm on image 113/series 4 versus 2.7 cm on the prior exam (when remeasured). Right external iliac nodal mass measures 2.2 cm today versus 2.3 cm on the prior. Reproductive: Retroverted uterus. Presumed follicles or cysts in the right ovary. Other: Trace free pelvic fluid is likely physiologic. No evidence of omental or peritoneal disease. Musculoskeletal: Degenerate disc disease at the lumbosacral junction. IMPRESSION: 1. Overall slight progression of lymphoma/leukemia. The majority of index nodes measure slightly larger. Some are similar. 2. No superimposed acute process. 3.  Emphysema (ICD10-J43.9). 4.  Aortic Atherosclerosis (ICD10-I70.0).  This is age advanced Electronically Signed   By: Abigail Miyamoto M.D.   On: 10/01/2017 13:52   Ct Abdomen Pelvis W Contrast  Result Date: 10/01/2017 CLINICAL DATA:  Followup of chronic lymphocytic leukemia. EXAM: CT CHEST, ABDOMEN, AND PELVIS WITH CONTRAST TECHNIQUE: Multidetector CT imaging of the chest, abdomen and pelvis was performed following the standard protocol during bolus administration of intravenous contrast. CONTRAST:  116m ISOVUE-300 IOPAMIDOL (ISOVUE-300) INJECTION 61% COMPARISON:  07/03/2017 FINDINGS: CT CHEST FINDINGS Cardiovascular: Normal heart size, without pericardial effusion. No central pulmonary embolism, on this non-dedicated study.  Mediastinum/Nodes: Bilateral axillary and subpectoral adenopathy again identified. Index left axillary node measures 1.7 x 3.3 cm today versus 1.7 x 2.8 cm on the prior. Index left subpectoral node measures 1.6 cm on image 10/series 4 versus 1.8 cm on the prior. Right paratracheal node measures 1.1 cm on image 18/series 4 versus 1.0 cm on the prior. Subcarinal node at 1.7 cm on image 25/series 4 is similar 1.6 cm on the prior. Bilateral hilar adenopathy. Lungs/Pleura: No pleural fluid.  Mild centrilobular emphysema. Subpleural right middle lobe pulmonary nodule is similar, including at 5 mm. Thickening along the right minor fissure versus a subpleural lymph node, similar. Suspect mucoid impaction with calcification in the left lower lobe, including on image 92/series 5, similar. Musculoskeletal: No acute osseous abnormality. CT ABDOMEN PELVIS FINDINGS Hepatobiliary: Hepatomegaly at 18.9 cm craniocaudal. Normal gallbladder, without biliary ductal dilatation. Pancreas: Normal, without mass or ductal dilatation. Spleen: Normal appearance of the spleen. Suspect adenopathy within the splenic hilum, including at 1.2 cm on image 59/series 4. Compare 1.1 cm on the prior. Adrenals/Urinary Tract: Normal right adrenal gland. Mild left  adrenal thickening. Normal kidneys, without hydronephrosis. Normal urinary bladder. Stomach/Bowel: Normal stomach, without wall thickening. Normal colon, appendix, and terminal ileum. Normal small bowel. Vascular/Lymphatic: Aortic atherosclerosis. Bulky retroperitoneal adenopathy. Left periaortic nodal mass measures 3.9 cm today versus 4.0 cm on the prior. Small bowel mesenteric adenopathy at 2.2 cm on image 84/series 4, 2.1 cm previously. Marked bilateral pelvic sidewall adenopathy. Index left external iliac node measures 2.9 cm on image 113/series 4 versus 2.7 cm on the prior exam (when remeasured). Right external iliac nodal mass measures 2.2 cm today versus 2.3 cm on the prior. Reproductive:  Retroverted uterus. Presumed follicles or cysts in the right ovary. Other: Trace free pelvic fluid is likely physiologic. No evidence of omental or peritoneal disease. Musculoskeletal: Degenerate disc disease at the lumbosacral junction. IMPRESSION: 1. Overall slight progression of lymphoma/leukemia. The majority of index nodes measure slightly larger. Some are similar. 2. No superimposed acute process. 3.  Emphysema (ICD10-J43.9). 4.  Aortic Atherosclerosis (ICD10-I70.0).  This is age advanced Electronically Signed   By: Abigail Miyamoto M.D.   On: 10/01/2017 13:52    ASSESSMENT: CLL, Rai stage 1  PLAN:    1. CLL: Confirmed by peripheral blood flow cytometry. CT scan results from October 01, 2017 reviewed independently and reported as above revealed minimal progression. She completed cycle 4 of weekly Rituxan on February 20, 2017. No intervention is needed at this time. She likely will require more aggressive treatment in the future possibly using Treanda plus Rituxan, but this is not necessary at this point. Return to clinic in 3 months with with laboratory work and further evaluation.  Will repeat imaging in 6 months in July 2019. 2. Lymphadenopathy: Secondary CLL. Patient does not require further ENT follow-up for biopsy.  3.  Leukocytosis: Secondary to CLL.  Continue to monitor closely.  Approximately 30 minutes was spent in discussion of which greater than 50% was consultation.  Patient expressed understanding and was in agreement with this plan. She also understands that She can call clinic at any time with any questions, concerns, or complaints.    Lloyd Huger, MD   10/03/2017 11:32 AM

## 2017-10-01 ENCOUNTER — Ambulatory Visit
Admission: RE | Admit: 2017-10-01 | Discharge: 2017-10-01 | Disposition: A | Discharge status: Home / Self Care | Payer: BLUE CROSS/BLUE SHIELD | Source: Ambulatory Visit | Attending: Oncology | Admitting: Oncology

## 2017-10-01 ENCOUNTER — Inpatient Hospital Stay: Payer: BLUE CROSS/BLUE SHIELD | Attending: Oncology

## 2017-10-01 DIAGNOSIS — Z720 Tobacco use: Secondary | ICD-10-CM | POA: Diagnosis not present

## 2017-10-01 DIAGNOSIS — C9190 Lymphoid leukemia, unspecified not having achieved remission: Secondary | ICD-10-CM | POA: Insufficient documentation

## 2017-10-01 DIAGNOSIS — J439 Emphysema, unspecified: Secondary | ICD-10-CM | POA: Insufficient documentation

## 2017-10-01 DIAGNOSIS — I700 Atherosclerosis of aorta: Secondary | ICD-10-CM | POA: Insufficient documentation

## 2017-10-01 DIAGNOSIS — C9110 Chronic lymphocytic leukemia of B-cell type not having achieved remission: Secondary | ICD-10-CM

## 2017-10-01 DIAGNOSIS — R599 Enlarged lymph nodes, unspecified: Secondary | ICD-10-CM | POA: Insufficient documentation

## 2017-10-01 DIAGNOSIS — D72829 Elevated white blood cell count, unspecified: Secondary | ICD-10-CM | POA: Diagnosis not present

## 2017-10-01 LAB — COMPREHENSIVE METABOLIC PANEL
ALBUMIN: 4 g/dL (ref 3.5–5.0)
ALK PHOS: 46 U/L (ref 38–126)
ALT: 16 U/L (ref 14–54)
ANION GAP: 7 (ref 5–15)
AST: 20 U/L (ref 15–41)
BUN: 13 mg/dL (ref 6–20)
CALCIUM: 8.7 mg/dL — AB (ref 8.9–10.3)
CHLORIDE: 102 mmol/L (ref 101–111)
CO2: 25 mmol/L (ref 22–32)
Creatinine, Ser: 0.83 mg/dL (ref 0.44–1.00)
GFR calc Af Amer: >60 mL/min (ref >60)
GFR calc non Af Amer: >60 mL/min (ref >60)
GLUCOSE: 86 mg/dL (ref 65–99)
POTASSIUM: 4.3 mmol/L (ref 3.5–5.1)
SODIUM: 134 mmol/L — AB (ref 135–145)
Total Bilirubin: 0.6 mg/dL (ref 0.3–1.2)
Total Protein: 6.5 g/dL (ref 6.5–8.1)

## 2017-10-01 LAB — CBC WITH DIFFERENTIAL/PLATELET
BASOS PCT: 1 %
Basophils Absolute: 0.2 10*3/uL — ABNORMAL HIGH (ref 0–0.1)
EOS ABS: 0.1 10*3/uL (ref 0–0.7)
EOS PCT: 0 %
HCT: 42 % (ref 35.0–47.0)
HEMOGLOBIN: 13.8 g/dL (ref 12.0–16.0)
Lymphocytes Relative: 81 %
Lymphs Abs: 26.6 10*3/uL — ABNORMAL HIGH (ref 1.0–3.6)
MCH: 32.1 pg (ref 26.0–34.0)
MCHC: 32.8 g/dL (ref 32.0–36.0)
MCV: 97.8 fL (ref 80.0–100.0)
Monocytes Absolute: 0.8 10*3/uL (ref 0.2–0.9)
Monocytes Relative: 3 %
NEUTROS PCT: 15 %
Neutro Abs: 4.9 10*3/uL (ref 1.4–6.5)
PLATELETS: 241 10*3/uL (ref 150–440)
RBC: 4.29 MIL/uL (ref 3.80–5.20)
RDW: 13.7 % (ref 11.5–14.5)
WBC: 32.7 10*3/uL — AB (ref 3.6–11.0)

## 2017-10-01 MED ORDER — IOPAMIDOL (ISOVUE-300) INJECTION 61%
100.0000 mL | Freq: Once | INTRAVENOUS | Status: AC | PRN
  Administered 2017-10-01: 100 mL via INTRAVENOUS

## 2017-10-02 ENCOUNTER — Encounter: Payer: Self-pay | Admitting: Family Medicine

## 2017-10-02 ENCOUNTER — Ambulatory Visit (INDEPENDENT_AMBULATORY_CARE_PROVIDER_SITE_OTHER): Payer: BLUE CROSS/BLUE SHIELD | Admitting: Family Medicine

## 2017-10-02 VITALS — BP 90/60 | HR 67 | Resp 14 | Ht 67.0 in | Wt 147.8 lb

## 2017-10-02 DIAGNOSIS — Z716 Tobacco abuse counseling: Secondary | ICD-10-CM

## 2017-10-02 DIAGNOSIS — C9190 Lymphoid leukemia, unspecified not having achieved remission: Secondary | ICD-10-CM

## 2017-10-02 DIAGNOSIS — I700 Atherosclerosis of aorta: Secondary | ICD-10-CM | POA: Diagnosis not present

## 2017-10-02 DIAGNOSIS — G500 Trigeminal neuralgia: Secondary | ICD-10-CM | POA: Diagnosis not present

## 2017-10-02 DIAGNOSIS — C9110 Chronic lymphocytic leukemia of B-cell type not having achieved remission: Secondary | ICD-10-CM

## 2017-10-02 DIAGNOSIS — F330 Major depressive disorder, recurrent, mild: Secondary | ICD-10-CM | POA: Diagnosis not present

## 2017-10-02 DIAGNOSIS — J439 Emphysema, unspecified: Secondary | ICD-10-CM | POA: Diagnosis not present

## 2017-10-02 MED ORDER — UMECLIDINIUM-VILANTEROL 62.5-25 MCG/INH IN AEPB: INHALATION_SPRAY | RESPIRATORY_TRACT | 5 refills | Status: DC

## 2017-10-02 MED ORDER — VARENICLINE TARTRATE 0.5 MG X 11 & 1 MG X 42 PO MISC: ORAL | 0 refills | Status: DC

## 2017-10-02 MED ORDER — VARENICLINE TARTRATE 1 MG PO TABS
1.0000 mg | ORAL_TABLET | Freq: Two times a day (BID) | ORAL | 2 refills | Status: DC
Start: 2017-10-02 — End: 2018-01-08

## 2017-10-02 NOTE — Progress Notes (Signed)
Name: Monique [name]   MRN: [id number]    DOB: 1971-05-17   Date:10/02/2017       Progress Note  Subjective  Chief Complaint  Chief Complaint  Patient presents with  . COPD  . Depression  . Follow-up    HPI  Trigeminal neuralgia: she is doing well, diagnosed 08/2017 , took carbamazepine and symptoms resolved, she will take prn now  Elevated bp: resolved, bp is towards low end of normal today but she is feeling well, no dizziness or palpitation  Depression: she states she is feeling well, wants to stay off medications at this time  CLL: she has follow up with [name] tomorrow, had CT yesterday, she still has palpable lymphadenopathy, no chills or fever, she has night sweats at times  Emphysema: she is ready to quit smoking, she states Anoro has helped with her symptoms, but still has an occasional cough, no wheezing or SOB.    Patient Active Problem List   Diagnosis Date Noted  . Thoracic aortic atherosclerosis ([place]) 08/20/2017  . Emphysema of lung ([place]) 08/20/2017  . Low back pain 07/02/2017  . Adductor tendinitis 04/23/2017  . Trochanteric bursitis of right hip 04/23/2017  . GERD without esophagitis 12/11/2016  . CLL (chronic lymphocytic leukemia) ([place]) 11/24/2016  . Pap smear abnormality of cervix/human papillomavirus (HPV) positive 09/12/2016  . Stress incontinence 09/04/2016  . B12 deficiency 07/10/2015  . Insomnia, persistent 07/10/2015  . Mild episode of recurrent major depressive disorder ([place]) 07/10/2015  . Anorexia nervosa, restricting type 07/10/2015  . Anxiety, generalized 07/10/2015  . H/O suicide attempt 07/10/2015  . Lymphocytosis 07/10/2015  . Obsessive-compulsive disorder 07/10/2015  . Tobacco use 07/10/2015  . History of cervical dysplasia 07/18/2014    Past Surgical History:  Procedure Laterality Date  . CERVICAL BIOPSY  W/ LOOP ELECTRODE EXCISION    . TUBAL LIGATION      Family History  Problem Relation Age of Onset  . Cancer Mother         thyroid  . Alcohol abuse Father   . Depression Sister   . Cancer Sister        cevical  . Alcohol abuse Brother   . Depression Brother   . Bipolar disorder Brother   . ADD / ADHD Son     Social History   Socioeconomic History  . Marital status: Divorced    Spouse name: Not on file  . Number of children: Not on file  . Years of education: Not on file  . Highest education level: Not on file  Social Needs  . Financial resource strain: Not on file  . Food insecurity - worry: Not on file  . Food insecurity - inability: Not on file  . Transportation needs - medical: Not on file  . Transportation needs - non-medical: Not on file  Occupational History  . Not on file  Tobacco Use  . Smoking status: Current Every Day Smoker    Packs/day: 1.00    Years: 30.00    Pack years: 30.00    Start date: 09/25/1986  . Smokeless tobacco: Never Used  Substance and Sexual Activity  . Alcohol use: Yes    Alcohol/week: 0.0 oz    Comment: rarely  . Drug use: No  . Sexual activity: Yes    Partners: Male    Birth control/protection: None  Other Topics Concern  . Not on file  Social History Narrative  . Not on file     Current Outpatient Medications:  .  ANORO ELLIPTA 62.5-25 MCG/INH AEPB, TAKE 1 PUFF BY MOUTH EVERY DAY, Disp: 60 each, Rfl: 0 .  carbamazepine (TEGRETOL) 200 MG tablet, TAKE 1 TABLET (200 MG TOTAL) BY MOUTH 3 (THREE) TIMES DAILY. (Patient not taking: Reported on 10/02/2017), Disp: 90 tablet, Rfl: 0 .  meloxicam (MOBIC) 15 MG tablet, , Disp: , Rfl:   No Known Allergies   ROS  Constitutional: Negative for fever or weight change.  Respiratory: Negative for cough and shortness of breath.   Cardiovascular: Negative for chest pain or palpitations.  Gastrointestinal: Negative for abdominal pain, no bowel changes.  Musculoskeletal: Negative for gait problem or joint swelling.  Skin: Negative for rash.  Neurological: Negative for dizziness or headache.  No other  specific complaints in a complete review of systems (except as listed in HPI above).  Objective  Vitals:   10/02/17 0934  BP: 90/60  Pulse: 67  Resp: 14  SpO2: 98%  Weight: 147 lb 12.8 oz (67 kg)  Height: _0  (1.702 m)    Body mass index is 23.15 kg/m.  Physical Exam  Constitutional: Patient appears well-developed and well-nourished.  No distress.  HEENT: head atraumatic, normocephalic, pupils equal and reactive to light,  neck supple, throat within normal limits Cardiovascular: Normal rate, regular rhythm and normal heart sounds.  No murmur heard. No BLE edema. Pulmonary/Chest: Effort normal and breath sounds normal. No respiratory distress. Abdominal: Soft.  There is no tenderness. Psychiatric: Patient has a normal mood and affect. behavior is normal. Judgment and thought content normal.  Recent Results (from the past 2160 hour(s))  CBC with Differential/Platelet     Status: Abnormal   Collection Time: 10/01/17  8:59 AM  Result Value Ref Range   WBC 32.7 (H) 3.6 - 11.0 K/uL   RBC 4.29 3.80 - 5.20 MIL/uL   Hemoglobin 13.8 12.0 - 16.0 g/dL   HCT 42.0 35.0 - 47.0 %   MCV 97.8 80.0 - 100.0 fL   MCH 32.1 26.0 - 34.0 pg   MCHC 32.8 32.0 - 36.0 g/dL   RDW 13.7 11.5 - 14.5 %   Platelets 241 150 - 440 K/uL   Neutrophils Relative % 15 %   Neutro Abs 4.9 1.4 - 6.5 K/uL   Lymphocytes Relative 81 %   Lymphs Abs 26.6 (H) 1.0 - 3.6 K/uL   Monocytes Relative 3 %   Monocytes Absolute 0.8 0.2 - 0.9 K/uL   Eosinophils Relative 0 %   Eosinophils Absolute 0.1 0 - 0.7 K/uL   Basophils Relative 1 %   Basophils Absolute 0.2 (H) 0 - 0.1 K/uL    Comment: Performed at [facility], [address] [place], [place] [address]  Comprehensive metabolic panel     Status: Abnormal   Collection Time: 10/01/17  8:59 AM  Result Value Ref Range   Sodium 134 (L) 135 - 145 mmol/L   Potassium 4.3 3.5 - 5.1 mmol/L   Chloride 102 101 - 111 mmol/L   CO2 25 22 - 32 mmol/L   Glucose, Bld 86 65  - 99 mg/dL   BUN 13 6 - 20 mg/dL   Creatinine, Ser 0.83 0.44 - 1.00 mg/dL   Calcium 8.7 (L) 8.9 - 10.3 mg/dL   Total Protein 6.5 6.5 - 8.1 g/dL   Albumin 4.0 3.5 - 5.0 g/dL   AST 20 15 - 41 U/L   ALT 16 14 - 54 U/L   Alkaline Phosphatase 46 38 - 126 U/L   Total Bilirubin 0.6  0.3 - 1.2 mg/dL   GFR calc non Af Amer >60 >60 mL/min   GFR calc Af Amer >60 >60 mL/min    Comment: (NOTE) The eGFR has been calculated using the CKD EPI equation. This calculation has not been validated in all clinical situations. eGFR's persistently <60 mL/min signify possible Chronic Kidney Disease.    Anion gap 7 5 - 15    Comment: Performed at [facility], [address] [place], [place] [address]     PHQ2/9: Depression screen [facility] 2/9 12/11/2016 09/04/2016 08/14/2016 01/03/2016 07/12/2015  Decreased Interest 0 0 1 0 0  Down, Depressed, Hopeless 1 0 1 0 1  PHQ - 2 Score 1 0 2 0 1  Altered sleeping - 0 1 - -  Tired, decreased energy - 0 0 - -  Change in appetite - 0 0 - -  Feeling bad or failure about yourself  - 0 0 - -  Trouble concentrating - 0 0 - -  Moving slowly or fidgety/restless - 0 0 - -  Suicidal thoughts - 0 0 - -  PHQ-9 Score - 0 3 - -  Difficult doing work/chores - Not difficult at all Somewhat difficult - -     Fall Risk: Fall Risk  08/20/2017 12/11/2016 09/04/2016 08/14/2016 01/03/2016  Falls in the past year? _0       Assessment & Plan  1. Pulmonary emphysema, unspecified emphysema type (HCC)  Cough has decreased with Anoro - varenicline (CHANTIX STARTING MONTH PAK) 0.5 MG X 11 & 1 MG X 42 tablet; Take one 0.5 mg tablet by mouth once daily for 3 days, then increase to one 0.5 mg tablet twice daily for 4 days, then increase to one 1 mg tablet twice daily.  Dispense: 53 tablet; Refill: 0 - varenicline (CHANTIX CONTINUING MONTH PAK) 1 MG tablet; Take 1 tablet (1 mg total) by mouth 2 (two) times daily. Fill starting kit first  Dispense: 60 tablet; Refill: 2 -  umeclidinium-vilanterol (ANORO ELLIPTA) 62.5-25 MCG/INH AEPB; TAKE 1 PUFF BY MOUTH EVERY DAY  Dispense: 60 each; Refill: 5  2. CLL (chronic lymphocytic leukemia) ([place])  She has follow up with hematologist tomorrow  3. Mild episode of recurrent major depressive disorder ([place])  She is doing well, not in remission but at her baseline, does not want medications and would like to try chantix now, discussed possible side effects, she will stop chantix if she notices a change in her mood   4. Encounter for tobacco use cessation counseling  - varenicline (CHANTIX STARTING MONTH PAK) 0.5 MG X 11 & 1 MG X 42 tablet; Take one 0.5 mg tablet by mouth once daily for 3 days, then increase to one 0.5 mg tablet twice daily for 4 days, then increase to one 1 mg tablet twice daily.  Dispense: 53 tablet; Refill: 0 - varenicline (CHANTIX CONTINUING MONTH PAK) 1 MG tablet; Take 1 tablet (1 mg total) by mouth 2 (two) times daily. Fill starting kit first  Dispense: 60 tablet; Refill: 2   7. Trigeminal neuralgia of right side of face  Resolved

## 2017-10-02 NOTE — Patient Instructions (Signed)

## 2017-10-03 ENCOUNTER — Other Ambulatory Visit: Payer: Self-pay

## 2017-10-03 ENCOUNTER — Inpatient Hospital Stay (HOSPITAL_BASED_OUTPATIENT_CLINIC_OR_DEPARTMENT_OTHER): Payer: BLUE CROSS/BLUE SHIELD | Admitting: Oncology

## 2017-10-03 ENCOUNTER — Encounter: Payer: Self-pay | Admitting: Oncology

## 2017-10-03 VITALS — BP 101/69 | HR 74 | Temp 97.4°F | Resp 20 | Wt 148.6 lb

## 2017-10-03 DIAGNOSIS — Z720 Tobacco use: Secondary | ICD-10-CM

## 2017-10-03 DIAGNOSIS — R599 Enlarged lymph nodes, unspecified: Secondary | ICD-10-CM

## 2017-10-03 DIAGNOSIS — C9110 Chronic lymphocytic leukemia of B-cell type not having achieved remission: Secondary | ICD-10-CM

## 2017-10-03 DIAGNOSIS — D72829 Elevated white blood cell count, unspecified: Secondary | ICD-10-CM

## 2017-10-03 NOTE — Progress Notes (Signed)
Patient denies any concerns today, here today for results.

## 2017-10-26 ENCOUNTER — Other Ambulatory Visit: Payer: Self-pay | Admitting: Family Medicine

## 2017-10-26 DIAGNOSIS — J439 Emphysema, unspecified: Secondary | ICD-10-CM

## 2017-10-26 DIAGNOSIS — Z716 Tobacco abuse counseling: Secondary | ICD-10-CM

## 2017-12-01 ENCOUNTER — Telehealth: Payer: Self-pay | Admitting: Family Medicine

## 2017-12-01 NOTE — Telephone Encounter (Unsigned)
Copied from [place]. Topic: Referral - Request >> Dec 01, 2017  4:25 PM Neva Seat wrote: Pt needing Neurologist referral - due to increasing pain in jaw, mouth and leg.  Pt has discussed this w/ Dr. Ancil Boozer. Pt do referral ASAP - Pt isn't able to sleep well at night.

## 2017-12-02 ENCOUNTER — Other Ambulatory Visit: Payer: Self-pay | Admitting: Family Medicine

## 2017-12-02 DIAGNOSIS — R51 Headache: Principal | ICD-10-CM

## 2017-12-02 DIAGNOSIS — R519 Headache, unspecified: Secondary | ICD-10-CM

## 2017-12-02 NOTE — Telephone Encounter (Signed)
Thy called back. She is at work and had phone on silent. She said that she does not have carbamazepine. Pt states she took 1 cyclobenzaprine and 2 benzonatate pills but it did not help. Pt did not know what the pills were for. She said she has several medications and isn't sure what they do. She has 1 pill of hydrocodone that was prescribed by [facility] for bursitis in her leg. She has tramadol and cyclobenzaprine that she states were prescribed for her back. She has meloxicam but isn't sure what it was prescribed for. I advised pt I would notify provider of meds. Pt had to spell each of them to me and not sure what each med does.  [id number] - [place], [place] - [address]  [address] [place] [address]  Phone: [phone] Fax: [phone]

## 2017-12-02 NOTE — Telephone Encounter (Signed)
It will take a couple of weeks before neurologist can see her, is she taking carbamazepine that I gave to her for trigeminal neuralgia?

## 2017-12-02 NOTE — Telephone Encounter (Signed)
Please see previous documentation

## 2017-12-03 NOTE — Telephone Encounter (Signed)
Can she come in this week, we can review medications, resume carbamazepine and try referral to neurologist, but it may take time for her to get there.

## 2017-12-04 ENCOUNTER — Ambulatory Visit (INDEPENDENT_AMBULATORY_CARE_PROVIDER_SITE_OTHER): Payer: BLUE CROSS/BLUE SHIELD | Admitting: Family Medicine

## 2017-12-04 ENCOUNTER — Encounter: Payer: Self-pay | Admitting: Family Medicine

## 2017-12-04 VITALS — BP 102/58 | HR 118 | Temp 98.3°F | Resp 18 | Ht 67.0 in | Wt 145.4 lb

## 2017-12-04 DIAGNOSIS — R6884 Jaw pain: Secondary | ICD-10-CM

## 2017-12-04 DIAGNOSIS — G500 Trigeminal neuralgia: Secondary | ICD-10-CM | POA: Diagnosis not present

## 2017-12-04 DIAGNOSIS — Z79899 Other long term (current) drug therapy: Secondary | ICD-10-CM | POA: Diagnosis not present

## 2017-12-04 DIAGNOSIS — R5383 Other fatigue: Secondary | ICD-10-CM | POA: Diagnosis not present

## 2017-12-04 DIAGNOSIS — E538 Deficiency of other specified B group vitamins: Secondary | ICD-10-CM | POA: Diagnosis not present

## 2017-12-04 DIAGNOSIS — L729 Follicular cyst of the skin and subcutaneous tissue, unspecified: Secondary | ICD-10-CM

## 2017-12-04 DIAGNOSIS — R202 Paresthesia of skin: Secondary | ICD-10-CM | POA: Diagnosis not present

## 2017-12-04 DIAGNOSIS — F330 Major depressive disorder, recurrent, mild: Secondary | ICD-10-CM

## 2017-12-04 DIAGNOSIS — R079 Chest pain, unspecified: Secondary | ICD-10-CM

## 2017-12-04 DIAGNOSIS — C9190 Lymphoid leukemia, unspecified not having achieved remission: Secondary | ICD-10-CM | POA: Diagnosis not present

## 2017-12-04 DIAGNOSIS — Z716 Tobacco abuse counseling: Secondary | ICD-10-CM

## 2017-12-04 DIAGNOSIS — C9110 Chronic lymphocytic leukemia of B-cell type not having achieved remission: Secondary | ICD-10-CM

## 2017-12-04 DIAGNOSIS — J439 Emphysema, unspecified: Secondary | ICD-10-CM

## 2017-12-04 DIAGNOSIS — I700 Atherosclerosis of aorta: Secondary | ICD-10-CM | POA: Diagnosis not present

## 2017-12-04 MED ORDER — CARBAMAZEPINE 200 MG PO TABS: 200.0000 mg | ORAL_TABLET | Freq: Three times a day (TID) | ORAL | 0 refills | Status: DC

## 2017-12-04 MED ORDER — VARENICLINE TARTRATE 0.5 MG X 11 & 1 MG X 42 PO MISC: ORAL | 0 refills | Status: DC

## 2017-12-04 NOTE — Progress Notes (Signed)
Name: Monique [name]   MRN: [id number]    DOB: [birth year]   Date:12/04/2017       Progress Note  Subjective  Chief Complaint  Chief Complaint  Patient presents with  . Discuss medication    Unsure what she is suppose to be taking and needs guidance on her medications.   . Cyst    Onset-1 week ago, dime size on back of her heads- she has three small spots which one of them gives her sharp pains occasionally.  . Nicotine Dependence    Forgot to take Chantix twice daily-would take morning dosage and did not know how to get back in routine.   . Jaw Pain    Intermittently-Starts on right side of her mouth and radiates to left side- swollen and feels everything is double the size of normal.    HPI   Jaw pain: she was  doing well, diagnosed 08/2017 , took carbamazepine for possible trigeminal neuralgia and symptoms resolved so she stopped medication and cancelled follow up with neurologist, however over the past 5 days she has noticed worsening of symptoms, described as severe sharp/dull/throbbing/burning at times pain that goes from right posterior ear to right side of jaw and chin. Mouth feels dry inside. She states it feels like the right side if swollen but is not able to be seen. Starts on right side of lip but now radiating to left side of mouth also and once went down her throat and her anterior chest. She has aorta atherosclerosis. Denies decrease in exercise tolerance. We will check lipid panel and cardiac enzymes also, differential diagnosis includes angina. She also has other neurological symptoms, paresthesia on her right side of back, also intermittent symptoms of tingling on both legs.   Depression: she states she is feeling well, wants to stay off medications at this time, states family was here recently visiting her, off medication. Denies suicidal thoughts or ideation   CLL: she has follow up with [name]  she still has palpable lymphadenopathy, no chills or fever, she has  night sweats at times , noticed two new nuchal bumps recently, has follow up in April   Cyst scalp: sometimes causes pain on left nuchal area.   Emphysema: she is ready to quit smoking, she states Anoro has helped with her symptoms but ran out of medication, explained she has refills at pharmacy. She states cough resolved, no wheezing or SOB.  CT chest showed emphysema, she has been forgetting to take Chantix, she would like another started kit.   Patient Active Problem List   Diagnosis Date Noted  . Thoracic aortic atherosclerosis ([place]) 08/20/2017  . Emphysema of lung ([place]) 08/20/2017  . Low back pain 07/02/2017  . Adductor tendinitis 04/23/2017  . Trochanteric bursitis of right hip 04/23/2017  . GERD without esophagitis 12/11/2016  . CLL (chronic lymphocytic leukemia) ([place]) 11/24/2016  . Pap smear abnormality of cervix/human papillomavirus (HPV) positive 09/12/2016  . Stress incontinence 09/04/2016  . B12 deficiency 07/10/2015  . Insomnia, persistent 07/10/2015  . Mild episode of recurrent major depressive disorder ([place]) 07/10/2015  . Anorexia nervosa, restricting type 07/10/2015  . Anxiety, generalized 07/10/2015  . H/O suicide attempt 07/10/2015  . Lymphocytosis 07/10/2015  . Obsessive-compulsive disorder 07/10/2015  . Tobacco use 07/10/2015  . History of cervical dysplasia 07/18/2014    Past Surgical History:  Procedure Laterality Date  . CERVICAL BIOPSY  W/ LOOP ELECTRODE EXCISION    . TUBAL LIGATION      Family  History  Problem Relation Age of Onset  . Cancer Mother        thyroid  . Alcohol abuse Father   . Depression Sister   . Cancer Sister        cevical  . Alcohol abuse Brother   . Depression Brother   . Bipolar disorder Brother   . ADD / ADHD Son     Social History   Socioeconomic History  . Marital status: Divorced    Spouse name: Not on file  . Number of children: Not on file  . Years of education: Not on file  . Highest education level: Not on  file  Occupational History  . Not on file  Social Needs  . Financial resource strain: Not on file  . Food insecurity:    Worry: Not on file    Inability: Not on file  . Transportation needs:    Medical: Not on file    Non-medical: Not on file  Tobacco Use  . Smoking status: Current Every Day Smoker    Packs/day: 1.00    Years: 31.00    Pack years: 31.00    Start date: 09/25/1986  . Smokeless tobacco: Never Used  . Tobacco comment: Would like to discuss Chantix- she forgot to take this medication  Substance and Sexual Activity  . Alcohol use: Yes    Alcohol/week: 0.0 oz    Comment: rarely  . Drug use: No  . Sexual activity: Yes    Partners: Male    Birth control/protection: None  Lifestyle  . Physical activity:    Days per week: Not on file    Minutes per session: Not on file  . Stress: Not on file  Relationships  . Social connections:    Talks on phone: Not on file    Gets together: Not on file    Attends religious service: Not on file    Active member of club or organization: Not on file    Attends meetings of clubs or organizations: Not on file    Relationship status: Not on file  . Intimate partner violence:    Fear of current or ex partner: Not on file    Emotionally abused: Not on file    Physically abused: Not on file    Forced sexual activity: Not on file  Other Topics Concern  . Not on file  Social History Narrative  . Not on file     Current Outpatient Medications:  .  carbamazepine (TEGRETOL) 200 MG tablet, Take 1 tablet (200 mg total) by mouth 3 (three) times daily., Disp: 90 tablet, Rfl: 0 .  meloxicam (MOBIC) 15 MG tablet, , Disp: , Rfl:  .  traMADol (ULTRAM) 50 MG tablet, Take by mouth every 6 (six) hours as needed., Disp: , Rfl:  .  umeclidinium-vilanterol (ANORO ELLIPTA) 62.5-25 MCG/INH AEPB, TAKE 1 PUFF BY MOUTH EVERY DAY (Patient not taking: Reported on 12/04/2017), Disp: 60 each, Rfl: 5 .  varenicline (CHANTIX CONTINUING MONTH PAK) 1 MG  tablet, Take 1 tablet (1 mg total) by mouth 2 (two) times daily. Fill starting kit first (Patient not taking: Reported on 12/04/2017), Disp: 60 tablet, Rfl: 2 .  varenicline (CHANTIX STARTING MONTH PAK) 0.5 MG X 11 & 1 MG X 42 tablet, Take one 0.5 mg tablet by mouth once daily for 3 days, then increase to one 0.5 mg tablet twice daily for 4 days, then increase to one 1 mg tablet twice daily., Disp: 53 tablet, Rfl:  0  No Known Allergies   ROS  Constitutional: Negative for fever or weight change.  Respiratory: Negative for cough and shortness of breath.   Cardiovascular: Negative for chest pain or palpitations.  Gastrointestinal: Negative for abdominal pain, no bowel changes.  Musculoskeletal: Negative for gait problem or joint swelling.  Skin: Negative for rash.  Neurological: Negative for dizziness or headache.  No other specific complaints in a complete review of systems (except as listed in HPI above).  Objective  Vitals:   12/04/17 1036  BP: (!) 102/58  Pulse: (!) 118  Resp: 18  Temp: 98.3 F (36.8 C)  TempSrc: Oral  SpO2: 97%  Weight: 145 lb 6.4 oz (66 kg)  Height: 5' 7"  (1.702 m)    Body mass index is 22.77 kg/m.  Physical Exam  Constitutional: Patient appears well-developed and well-nourished. No distress.  HEENT: head atraumatic, normocephalic, pupils equal and reactive to light,  neck supple, throat within normal limits Cardiovascular: Normal rate, regular rhythm and normal heart sounds.  No murmur heard. No BLE edema. Pulmonary/Chest: Effort normal and breath sounds normal. No respiratory distress. Abdominal: Soft.  There is no tenderness. Psychiatric: Patient has a normal mood and affect. behavior is normal. Judgment and thought content normal. Skin: cyst on left nuchal area Neurological : no focal deficit  Recent Results (from the past 2160 hour(s))  CBC with Differential/Platelet     Status: Abnormal   Collection Time: 10/01/17  8:59 AM  Result Value Ref  Range   WBC 32.7 (H) 3.6 - 11.0 K/uL   RBC 4.29 3.80 - 5.20 MIL/uL   Hemoglobin 13.8 12.0 - 16.0 g/dL   HCT 42.0 35.0 - 47.0 %   MCV 97.8 80.0 - 100.0 fL   MCH 32.1 26.0 - 34.0 pg   MCHC 32.8 32.0 - 36.0 g/dL   RDW 13.7 11.5 - 14.5 %   Platelets 241 150 - 440 K/uL   Neutrophils Relative % 15 %   Neutro Abs 4.9 1.4 - 6.5 K/uL   Lymphocytes Relative 81 %   Lymphs Abs 26.6 (H) 1.0 - 3.6 K/uL   Monocytes Relative 3 %   Monocytes Absolute 0.8 0.2 - 0.9 K/uL   Eosinophils Relative 0 %   Eosinophils Absolute 0.1 0 - 0.7 K/uL   Basophils Relative 1 %   Basophils Absolute 0.2 (H) 0 - 0.1 K/uL    Comment: Performed at [facility], [address] [place], [place] [address]  Comprehensive metabolic panel     Status: Abnormal   Collection Time: 10/01/17  8:59 AM  Result Value Ref Range   Sodium 134 (L) 135 - 145 mmol/L   Potassium 4.3 3.5 - 5.1 mmol/L   Chloride 102 101 - 111 mmol/L   CO2 25 22 - 32 mmol/L   Glucose, Bld 86 65 - 99 mg/dL   BUN 13 6 - 20 mg/dL   Creatinine, Ser 0.83 0.44 - 1.00 mg/dL   Calcium 8.7 (L) 8.9 - 10.3 mg/dL   Total Protein 6.5 6.5 - 8.1 g/dL   Albumin 4.0 3.5 - 5.0 g/dL   AST 20 15 - 41 U/L   ALT 16 14 - 54 U/L   Alkaline Phosphatase 46 38 - 126 U/L   Total Bilirubin 0.6 0.3 - 1.2 mg/dL   GFR calc non Af Amer >60 >60 mL/min   GFR calc Af Amer >60 >60 mL/min    Comment: (NOTE) The eGFR has been calculated using the CKD EPI equation.  This calculation has not been validated in all clinical situations. eGFR's persistently <60 mL/min signify possible Chronic Kidney Disease.    Anion gap 7 5 - 15    Comment: Performed at [facility], [address] [place], [place] [address]     PHQ2/9: Depression screen [facility] 2/9 12/04/2017 12/11/2016 09/04/2016 08/14/2016 01/03/2016  Decreased Interest 1 0 0 1 0  Down, Depressed, Hopeless 0 1 0 1 0  PHQ - 2 Score 1 1 0 2 0  Altered sleeping 2 - 0 1 -  Tired, decreased energy 2 - 0 0 -  Change in  appetite 1 - 0 0 -  Feeling bad or failure about yourself  0 - 0 0 -  Trouble concentrating 1 - 0 0 -  Moving slowly or fidgety/restless 0 - 0 0 -  Suicidal thoughts 0 - 0 0 -  PHQ-9 Score 7 - 0 3 -  Difficult doing work/chores Not difficult at all - Not difficult at all Somewhat difficult -    Fall Risk: Fall Risk  12/04/2017 08/20/2017 12/11/2016 09/04/2016 08/14/2016  Falls in the past year? No No No No No     Functional Status Survey: Is the patient deaf or have difficulty hearing?: Yes(Difficult hearing at times) Does the patient have difficulty seeing, even when wearing glasses/contacts?: Yes(Just got new bifocal glasses-[place]) Does the patient have difficulty concentrating, remembering, or making decisions?: Yes(Focusing at times) Does the patient have difficulty walking or climbing stairs?: Yes Does the patient have difficulty dressing or bathing?: No Does the patient have difficulty doing errands alone such as visiting a doctor's office or shopping?: No   Assessment & Plan  1. Trigeminal neuralgia of right side of face  - carbamazepine (TEGRETOL) 200 MG tablet; Take 1 tablet (200 mg total) by mouth 3 (three) times daily.  Dispense: 90 tablet; Refill: 0 - Ambulatory referral to Neurology  2. Pulmonary emphysema, unspecified emphysema type (HCC)  - varenicline (CHANTIX STARTING MONTH PAK) 0.5 MG X 11 & 1 MG X 42 tablet; Take one 0.5 mg tablet by mouth once daily for 3 days, then increase to one 0.5 mg tablet twice daily for 4 days, then increase to one 1 mg tablet twice daily.  Dispense: 53 tablet; Refill: 0  3. Encounter for tobacco use cessation counseling  - varenicline (CHANTIX STARTING MONTH PAK) 0.5 MG X 11 & 1 MG X 42 tablet; Take one 0.5 mg tablet by mouth once daily for 3 days, then increase to one 0.5 mg tablet twice daily for 4 days, then increase to one 1 mg tablet twice daily.  Dispense: 53 tablet; Refill: 0  4. Thoracic aortic atherosclerosis  (HCC)  - EKG 12-Lead  5. Mild episode of recurrent major depressive disorder ([place])   6. CLL (chronic lymphocytic leukemia) ([place])   7. Scalp cyst  She will discuss with [name]   8. Chest pain at rest  - CK Total (and CKMB) - Troponin I - Lipid panel  9. Long-term use of high-risk medication  - COMPLETE METABOLIC PANEL WITH GFR  10. Paresthesia  - MR Brain W Wo Contrast; Future  Multiple symptoms, patient has a history of CLL, having paresthesias around mouth, right jaw, at times difficulty swallowing, also paresthesia on right back and at times on her legs going on for months, but getting worse again over the past week.  Rule out MS or brain metastases from her CLL  11. Jaw pain  - MR  Brain W Wo Contrast; Future  12. B12 deficiency  - Vitamin B12

## 2017-12-05 LAB — COMPLETE METABOLIC PANEL WITH GFR
AG RATIO: 1.8 (calc) (ref 1.0–2.5)
ALT: 16 U/L (ref 6–29)
AST: 22 U/L (ref 10–35)
Albumin: 4.1 g/dL (ref 3.6–5.1)
Alkaline phosphatase (APISO): 55 U/L (ref 33–115)
BUN: 19 mg/dL (ref 7–25)
CALCIUM: 9 mg/dL (ref 8.6–10.2)
CO2: 30 mmol/L (ref 20–32)
CREATININE: 1.07 mg/dL (ref 0.50–1.10)
Chloride: 103 mmol/L (ref 98–110)
GFR, EST NON AFRICAN AMERICAN: 61 mL/min/{1.73_m2} (ref >=60)
GFR, Est African American: 71 mL/min/{1.73_m2} (ref >=60)
GLUCOSE: 85 mg/dL (ref 65–139)
Globulin: 2.3 g/dL (calc) (ref 1.9–3.7)
POTASSIUM: 4.4 mmol/L (ref 3.5–5.3)
Sodium: 141 mmol/L (ref 135–146)
Total Bilirubin: 0.3 mg/dL (ref 0.2–1.2)
Total Protein: 6.4 g/dL (ref 6.1–8.1)

## 2017-12-05 LAB — LIPID PANEL
CHOL/HDL RATIO: 5.1 (calc) — AB (ref <5.0)
Cholesterol: 178 mg/dL (ref <200)
HDL: 35 mg/dL — ABNORMAL LOW (ref >50)
LDL Cholesterol (Calc): 110 mg/dL (calc) — ABNORMAL HIGH
NON-HDL CHOLESTEROL (CALC): 143 mg/dL — AB (ref <130)
Triglycerides: 209 mg/dL — ABNORMAL HIGH (ref <150)

## 2017-12-05 LAB — VITAMIN B12: Vitamin B-12: 368 pg/mL (ref 200–1100)

## 2017-12-05 LAB — CK TOTAL AND CKMB (NOT AT ARMC)
CK, MB: <0.7 ng/mL (ref 0–5.0)
Total CK: 44 U/L (ref 29–143)

## 2017-12-05 LAB — TROPONIN I

## 2017-12-09 ENCOUNTER — Telehealth: Payer: Self-pay | Admitting: *Deleted

## 2017-12-09 DIAGNOSIS — R51 Headache: Secondary | ICD-10-CM | POA: Diagnosis not present

## 2017-12-09 DIAGNOSIS — R202 Paresthesia of skin: Secondary | ICD-10-CM | POA: Diagnosis not present

## 2017-12-09 NOTE — Telephone Encounter (Signed)
Patient just had imaging in January.  Please have her see symptom management clinic to see if additional imaging is necessary.

## 2017-12-09 NOTE — Telephone Encounter (Signed)
Patient called reporting that she has found some lynph nodes inher brest, neck and back of her head. She does not have FOLLOW UP appointment until mid April. Please advise if you are to see her or if you wants Symptom Management Clinic to see her

## 2017-12-09 NOTE — Telephone Encounter (Signed)
Patient accepts appointment for tomorrow at 130

## 2017-12-10 ENCOUNTER — Inpatient Hospital Stay: Payer: BLUE CROSS/BLUE SHIELD | Attending: Oncology | Admitting: Oncology

## 2017-12-10 DIAGNOSIS — N63 Unspecified lump in unspecified breast: Secondary | ICD-10-CM | POA: Insufficient documentation

## 2017-12-10 DIAGNOSIS — C9110 Chronic lymphocytic leukemia of B-cell type not having achieved remission: Secondary | ICD-10-CM | POA: Diagnosis not present

## 2017-12-10 DIAGNOSIS — Z720 Tobacco use: Secondary | ICD-10-CM

## 2017-12-10 NOTE — Progress Notes (Signed)
Patient here today to be evaluated for some lymph nodes in her neck and a right sided breast mass. She is very anxious today and has numerous complaints. She reports having intermittent neck and jaw pain radiating around the back of her head, which she is being evaluated by neurology for. She also reports intermittent leg and back pain which is worse after standing on her feet for a ten hour shift at work.

## 2017-12-10 NOTE — Progress Notes (Signed)
Symptom Management Consult note [facility]  Telephone:([phone] [phone]  Patient Care Team: [name], MD as PCP - General (Family Medicine)   Name of the patient: Monique [name]  [id number]  08-11-1971   Date of visit: 12/16/17  Diagnosis- CLL  Chief complaint/ Reason for visit-  New lymph nodes. New Right Breast Mass.   Heme/Onc history: Recent CLL diagnosis by flow cytometry. Patient was last seen by primary medical oncologist [name] on 10/03/2017 for evaluation and discussion of imaging results. Patient is status post 4 cycles of weekly Rituxan completed on 02/20/2017. Recent CT scan from January 2019 revealed minimal progression. No intervention is recommended at this time by [name]. She will likely require aggressive treatment in the future that is not necessary at this point. She continued to have cervical, submandibular and occipital lymphadenopathy and leukocytosis which is secondary to her CLL.  Interval history-  Patient presents for evaluation of a breast mass.  Change was noted 1 week ago.  Patient does not routinely do self breast exams.   Patient has not noted a change on breast exam.  Breast cancer risk factors include family hx of Pancreatic CA. Age of menarche was 62.  Last menstrual period was now. Patient denies hormonal therapy.  Patient is G4P4. Age of first live birth was 86.  Patient did breast feed.   Patient does admit to bilateral nipple discharge with manual expression.  Patient denies previous breast biopsy. Patient denies a personal history of breast cancer.   ECOG FS:0 - Asymptomatic  Review of systems- Review of Systems  Constitutional: Negative.  Negative for chills, fever, malaise/fatigue and weight loss.  HENT: Negative for congestion and ear pain.   Eyes: Negative.  Negative for blurred vision and double vision.  Respiratory: Negative.  Negative for cough, sputum production and  shortness of breath.   Cardiovascular: Negative.  Negative for chest pain, palpitations and leg swelling.  Gastrointestinal: Negative.  Negative for abdominal pain, constipation, diarrhea, nausea and vomiting.  Genitourinary: Negative for dysuria, frequency and urgency.  Musculoskeletal: Negative for back pain and falls.       Palpated breast mass  Skin: Negative.  Negative for rash.  Neurological: Negative.  Negative for weakness and headaches.  Endo/Heme/Allergies: Negative.  Does not bruise/bleed easily.  Psychiatric/Behavioral: Negative.  Negative for depression. The patient is not nervous/anxious and does not have insomnia.      Current treatment- Observation  No Known Allergies   Past Medical History:  Diagnosis Date  . Anxiety   . Cervical cancer ([place])    hx LEEP over 18 years ago.   . Cervical intraepithelial neoplasia I   . Chronic lymphocytic leukemia ([place])   . Depression   . Eating disorder   . History of self-harm   . Insomnia   . Obsession   . Pap smear abnormality of cervix with LGSIL   . Tobacco abuse   . Vitamin B12 deficiency (non anemic)      Past Surgical History:  Procedure Laterality Date  . CERVICAL BIOPSY  W/ LOOP ELECTRODE EXCISION    . TUBAL LIGATION      Social History   Socioeconomic History  . Marital status: Divorced    Spouse name: Not on file  . Number of children: Not on file  . Years of education: Not on file  . Highest education level: Not on file  Occupational History  . Not on file  Social Needs  .  Financial resource strain: Not on file  . Food insecurity:    Worry: Not on file    Inability: Not on file  . Transportation needs:    Medical: Not on file    Non-medical: Not on file  Tobacco Use  . Smoking status: Current Every Day Smoker    Packs/day: 1.00    Years: 31.00    Pack years: 31.00    Start date: 09/25/1986  . Smokeless tobacco: Never Used  . Tobacco comment: Would like to discuss Chantix- she forgot to take  this medication  Substance and Sexual Activity  . Alcohol use: Yes    Alcohol/week: 0.0 oz    Comment: rarely  . Drug use: No  . Sexual activity: Yes    Partners: Male    Birth control/protection: None  Lifestyle  . Physical activity:    Days per week: Not on file    Minutes per session: Not on file  . Stress: Not on file  Relationships  . Social connections:    Talks on phone: Not on file    Gets together: Not on file    Attends religious service: Not on file    Active member of club or organization: Not on file    Attends meetings of clubs or organizations: Not on file    Relationship status: Not on file  . Intimate partner violence:    Fear of current or ex partner: Not on file    Emotionally abused: Not on file    Physically abused: Not on file    Forced sexual activity: Not on file  Other Topics Concern  . Not on file  Social History Narrative  . Not on file    Family History  Problem Relation Age of Onset  . Cancer Mother        thyroid  . Alcohol abuse Father   . Depression Sister   . Cancer Sister        cevical  . Alcohol abuse Brother   . Depression Brother   . Bipolar disorder Brother   . ADD / ADHD Son      Current Outpatient Medications:  .  gabapentin (NEURONTIN) 100 MG capsule, Take 100 mg twice a day for one week, then increase to 200 mg(2 tablets) twice a day and continue, Disp: , Rfl:  .  traMADol (ULTRAM) 50 MG tablet, Take by mouth every 6 (six) hours as needed., Disp: , Rfl:  .  umeclidinium-vilanterol (ANORO ELLIPTA) 62.5-25 MCG/INH AEPB, TAKE 1 PUFF BY MOUTH EVERY DAY, Disp: 60 each, Rfl: 5 .  varenicline (CHANTIX CONTINUING MONTH PAK) 1 MG tablet, Take 1 tablet (1 mg total) by mouth 2 (two) times daily. Fill starting kit first, Disp: 60 tablet, Rfl: 2 .  carbamazepine (TEGRETOL) 200 MG tablet, Take 1 tablet (200 mg total) by mouth 3 (three) times daily. (Patient not taking: Reported on 12/10/2017), Disp: 90 tablet, Rfl: 0 .  escitalopram  (LEXAPRO) 10 MG tablet, , Disp: , Rfl:   Physical exam:  Vitals:   12/10/17 1330  BP: 106/68  Pulse: 70  Resp: 18  Temp: 98.6 F (37 C)  TempSrc: Tympanic  Weight: 149 lb (67.6 kg)   Physical Exam  Constitutional: She is oriented to person, place, and time and well-developed, well-nourished, and in no distress. Vital signs are normal.  HENT:  Head: Normocephalic and atraumatic.  Eyes: Pupils are equal, round, and reactive to light.  Neck: Normal range of motion.  Cardiovascular: Normal  rate, regular rhythm and normal heart sounds.  No murmur heard. Pulmonary/Chest: Effort normal and breath sounds normal. She has no wheezes. Right breast exhibits mass and tenderness. Right breast exhibits no nipple discharge and no skin change. Left breast exhibits no nipple discharge and no skin change.    Abdominal: Soft. Normal appearance and bowel sounds are normal. She exhibits no distension. There is no tenderness.  Musculoskeletal: Normal range of motion. She exhibits no edema.  Neurological: She is alert and oriented to person, place, and time. Gait normal.  Skin: Skin is warm and dry. No rash noted.  Psychiatric: Mood, memory, affect and judgment normal.     CMP Latest Ref Rng & Units 12/04/2017  Glucose 65 - 139 mg/dL 85  BUN 7 - 25 mg/dL 19  Creatinine 0.50 - 1.10 mg/dL 1.07  Sodium 135 - 146 mmol/L 141  Potassium 3.5 - 5.3 mmol/L 4.4  Chloride 98 - 110 mmol/L 103  CO2 20 - 32 mmol/L 30  Calcium 8.6 - 10.2 mg/dL 9.0  Total Protein 6.1 - 8.1 g/dL 6.4  Total Bilirubin 0.2 - 1.2 mg/dL 0.3  Alkaline Phos 38 - 126 U/L -  AST 10 - 35 U/L 22  ALT 6 - 29 U/L 16   CBC Latest Ref Rng & Units 10/01/2017  WBC 3.6 - 11.0 K/uL 32.7(H)  Hemoglobin 12.0 - 16.0 g/dL 13.8  Hematocrit 35.0 - 47.0 % 42.0  Platelets 150 - 440 K/uL 241    No images are attached to the encounter.  No results found.   Assessment and plan- Patient is a 47 y.o. female presents for a palpated right breast  mass.  1. CLL: Currently under care of primary oncologist [name]. Recent CT scan showed minimal progression. No intervention was needed at this time for her CLL. She is scheduled for follow-up with [name] with labs on January 01, 2018.   2. Right painless moveable breast mass: Patient states she has not had a mammogram "in a very long time". Last mammogram noted in system is from 07/2014 from the [facility] clinic identifying no evidence of malignancy and recommending a screening mammogram in 1 year. Given palpated movable mass in right breast, will get diagnostic bilateral mammogram and ultrasound ASAP. [name] and patient in agreement with plan. Have placed orders. Will get her scheduled.   Visit Diagnosis 1. Breast mass in female   2. CLL (chronic lymphocytic leukemia) ([place])     Patient expressed understanding and was in agreement with this plan. She also understands that She can call clinic at any time with any questions, concerns, or complaints.   Greater than 50% was spent in counseling and coordination of care with this patient including but not limited to discussion of the relevant topics above (See A&P) including, but not limited to diagnosis and management of acute and chronic medical conditions.    Faythe Casa, AGNP-C [facility] [place]- [phone] Pager- [phone] 12/16/2017 3:37 PM

## 2017-12-11 DIAGNOSIS — R519 Headache, unspecified: Secondary | ICD-10-CM | POA: Insufficient documentation

## 2017-12-11 DIAGNOSIS — R202 Paresthesia of skin: Secondary | ICD-10-CM | POA: Insufficient documentation

## 2017-12-11 DIAGNOSIS — R51 Headache: Secondary | ICD-10-CM

## 2017-12-22 ENCOUNTER — Telehealth: Payer: Self-pay

## 2017-12-22 NOTE — Telephone Encounter (Signed)
Copied from [place]. Topic: Inquiry >> Dec 19, 2017  5:10 PM Oliver Pila B wrote: Reason for CRM: pt called to give pcp a number to call her insurance to get approval for a MRI, contact: [phone]  Peer to peer needed.

## 2017-12-22 NOTE — Telephone Encounter (Signed)
We can try tomorrow... But not sure if I will have time until my next half day, since I have been working through lunch

## 2017-12-24 ENCOUNTER — Other Ambulatory Visit: Payer: Self-pay | Admitting: Neurology

## 2017-12-24 DIAGNOSIS — C9111 Chronic lymphocytic leukemia of B-cell type in remission: Secondary | ICD-10-CM

## 2017-12-24 DIAGNOSIS — R51 Headache: Secondary | ICD-10-CM | POA: Diagnosis not present

## 2017-12-24 DIAGNOSIS — R202 Paresthesia of skin: Secondary | ICD-10-CM | POA: Diagnosis not present

## 2017-12-26 ENCOUNTER — Ambulatory Visit
Admission: RE | Admit: 2017-12-26 | Discharge: 2017-12-26 | Disposition: A | Discharge status: Home / Self Care | Payer: BLUE CROSS/BLUE SHIELD | Source: Ambulatory Visit | Attending: Oncology | Admitting: Oncology

## 2017-12-26 DIAGNOSIS — R928 Other abnormal and inconclusive findings on diagnostic imaging of breast: Secondary | ICD-10-CM | POA: Diagnosis not present

## 2017-12-26 DIAGNOSIS — N63 Unspecified lump in unspecified breast: Secondary | ICD-10-CM

## 2017-12-26 DIAGNOSIS — N6311 Unspecified lump in the right breast, upper outer quadrant: Secondary | ICD-10-CM | POA: Diagnosis not present

## 2017-12-26 DIAGNOSIS — I899 Noninfective disorder of lymphatic vessels and lymph nodes, unspecified: Secondary | ICD-10-CM | POA: Insufficient documentation

## 2017-12-26 DIAGNOSIS — N6313 Unspecified lump in the right breast, lower outer quadrant: Secondary | ICD-10-CM | POA: Diagnosis not present

## 2017-12-26 DIAGNOSIS — N6341 Unspecified lump in right breast, subareolar: Secondary | ICD-10-CM | POA: Diagnosis not present

## 2017-12-29 ENCOUNTER — Telehealth: Payer: Self-pay | Admitting: *Deleted

## 2017-12-29 ENCOUNTER — Other Ambulatory Visit: Payer: Self-pay | Admitting: Oncology

## 2017-12-29 DIAGNOSIS — N6341 Unspecified lump in right breast, subareolar: Secondary | ICD-10-CM | POA: Diagnosis not present

## 2017-12-29 DIAGNOSIS — R590 Localized enlarged lymph nodes: Secondary | ICD-10-CM | POA: Diagnosis not present

## 2017-12-29 DIAGNOSIS — R928 Other abnormal and inconclusive findings on diagnostic imaging of breast: Secondary | ICD-10-CM

## 2017-12-29 DIAGNOSIS — N6310 Unspecified lump in the right breast, unspecified quadrant: Secondary | ICD-10-CM

## 2017-12-29 NOTE — Telephone Encounter (Signed)
Pt left vm this morning. Pt reports she now has lymph nodes in her neck and arm that are "visible through the skin". Pt also reports she has new breast masses, it looks like she had mammogram and ultrasounds on 4/12 ordered by Rulon Abide, NP. Pt is scheduled for MRI Brain on Thursday 4/18, also scheduled for labs and f/u with you on 4/18. Pt states she would like to know what is going on. Please advise if we need to change any of her appointments at this time.

## 2017-12-29 NOTE — Telephone Encounter (Signed)
I can see her in 3 days as planned.

## 2017-12-29 NOTE — Telephone Encounter (Signed)
Left vm for pt

## 2017-12-29 NOTE — Progress Notes (Signed)
[facility]  [phone] [phone]) [phone]  ID: Monique [name] OB: [birth year]  MR#: [id number]  NTZ#:[id number]  Patient Care Team: [name], MD as PCP - General (Family Medicine)  CHIEF COMPLAINT: CLL, breast mass.  INTERVAL HISTORY: Patient returns to clinic today as an add-on after noting a palpable breast mass on her right breast.  She also has increased lymphadenopathy in her neck and axilla.  She continues to be anxious, but otherwise feels well.  She has no neurologic complaints. She denies any fevers, illnesses, or weight loss. She has no chest pain or shortness of breath. She denies any nausea, vomiting, constipation, or diarrhea. She has no urinary complaints. Patient offers no further specific complaints today.  REVIEW OF SYSTEMS:   Review of Systems  Constitutional: Negative.  Negative for fever, malaise/fatigue and weight loss.  HENT: Negative.  Negative for congestion.   Respiratory: Negative for cough and shortness of breath.   Cardiovascular: Negative.  Negative for chest pain and leg swelling.  Gastrointestinal: Negative.  Negative for abdominal pain.  Genitourinary: Negative.  Negative for dysuria.  Musculoskeletal: Positive for myalgias and neck pain.  Skin: Negative.  Negative for rash.  Neurological: Negative.  Negative for sensory change, focal weakness and weakness.  Psychiatric/Behavioral: The patient is nervous/anxious.     As per HPI. Otherwise, a complete review of systems is negative.  PAST MEDICAL HISTORY: Past Medical History:  Diagnosis Date  . Anxiety   . Cervical cancer ([place])    hx LEEP over 18 years ago.   . Cervical intraepithelial neoplasia I   . Chronic lymphocytic leukemia ([place])   . Depression   . Eating disorder   . History of self-harm   . Insomnia   . Obsession   . Pap smear abnormality of cervix with LGSIL   . Tobacco abuse   . Vitamin B12 deficiency (non anemic)     PAST SURGICAL  HISTORY: Past Surgical History:  Procedure Laterality Date  . BREAST BIOPSY Right 01/05/2018   US guided biopsy of 2 areas and 1 lymph node  . CERVICAL BIOPSY  W/ LOOP ELECTRODE EXCISION    . TUBAL LIGATION      FAMILY HISTORY: Family History  Problem Relation Age of Onset  . Cancer Mother        thyroid  . Alcohol abuse Father   . Depression Sister   . Cancer Sister        cevical  . Alcohol abuse Brother   . Depression Brother   . Bipolar disorder Brother   . ADD / ADHD Son   . Breast cancer Neg Hx     ADVANCED DIRECTIVES (Y/N):  N  HEALTH MAINTENANCE: Social History   Tobacco Use  . Smoking status: Current Every Day Smoker    Packs/day: 1.00    Years: 31.00    Pack years: 31.00    Start date: 09/25/1986  . Smokeless tobacco: Never Used  . Tobacco comment: Would like to discuss Chantix- she forgot to take this medication  Substance Use Topics  . Alcohol use: Yes    Alcohol/week: 0.0 oz    Comment: rarely  . Drug use: No     Colonoscopy:  PAP:  Bone density:  Lipid panel:  No Known Allergies  Current Outpatient Medications  Medication Sig Dispense Refill  . carbamazepine (TEGRETOL) 200 MG tablet Take 1 tablet (200 mg total) by mouth 3 (three) times daily. 90 tablet 0  . escitalopram (LEXAPRO) 10  MG tablet     . gabapentin (NEURONTIN) 100 MG capsule Take 100 mg twice a day for one week, then increase to 200 mg(2 tablets) twice a day and continue    . umeclidinium-vilanterol (ANORO ELLIPTA) 62.5-25 MCG/INH AEPB TAKE 1 PUFF BY MOUTH EVERY DAY 60 each 5  . traMADol (ULTRAM) 50 MG tablet Take by mouth every 6 (six) hours as needed.    . varenicline (CHANTIX CONTINUING MONTH PAK) 1 MG tablet Take 1 tablet (1 mg total) by mouth 2 (two) times daily. Fill starting kit first (Patient not taking: Reported on 01/01/2018) 60 tablet 2   No current facility-administered medications for this visit.     OBJECTIVE: Vitals:   01/01/18 0941  BP: 103/67  Pulse: 76    Resp: 18  Temp: 98 F (36.7 C)  SpO2: 97%     Body mass index is 23.65 kg/m.    ECOG FS:0 - Asymptomatic  General: Well-developed, well-nourished, no acute distress. Eyes: Pink conjunctiva, anicteric sclera. Breast: Subareolar 1 to 2 cm right breast mass  Lungs: Clear to auscultation bilaterally. Heart: Regular rate and rhythm. No rubs, murmurs, or gallops. Abdomen: Soft, nontender, nondistended. No organomegaly noted, normoactive bowel sounds. Musculoskeletal: No edema, cyanosis, or clubbing. Neuro: Alert, answering all questions appropriately. Cranial nerves grossly intact. Skin: No rashes or petechiae noted. Psych: Normal affect. Lymphatics: Palpable cervical and axillary adenopathy.  LAB RESULTS:  Lab Results  Component Value Date   NA 141 12/04/2017   K 4.4 12/04/2017   CL 103 12/04/2017   CO2 30 12/04/2017   GLUCOSE 85 12/04/2017   BUN 19 12/04/2017   CREATININE 1.07 12/04/2017   CALCIUM 9.0 12/04/2017   PROT 6.4 12/04/2017   ALBUMIN 4.0 10/01/2017   AST 22 12/04/2017   ALT 16 12/04/2017   ALKPHOS 46 10/01/2017   BILITOT 0.3 12/04/2017   GFRNONAA 61 12/04/2017   GFRAA 71 12/04/2017    Lab Results  Component Value Date   WBC 32.7 (H) 01/01/2018   NEUTROABS 3.5 01/01/2018   HGB 12.6 01/01/2018   HCT 37.4 01/01/2018   MCV 97.8 01/01/2018   PLT 200 01/01/2018     STUDIES: Mr Jeri Cos LZ Contrast  Result Date: 01/01/2018 CLINICAL DATA:  47 y/o F; history of chronic lymphocytic leukemia. Posterior headache with sharp pains, left leg weakness, and right facial pain with numbness for 1 month. EXAM: MRI HEAD WITHOUT AND WITH CONTRAST TECHNIQUE: Multiplanar, multiecho pulse sequences of the brain and surrounding structures were obtained without and with intravenous contrast. CONTRAST:  16m MULTIHANCE GADOBENATE DIMEGLUMINE 529 MG/ML IV SOLN COMPARISON:  10/01/2017 CT of the neck. FINDINGS: Brain: No acute infarction, hemorrhage, hydrocephalus, extra-axial  collection or mass lesion. No abnormal enhancement. Vascular: Normal flow voids. Skull and upper cervical spine: Normal marrow signal. Sinuses/Orbits: Negative. Other: Left suboccipital scalp 12 mm low signal focus with calcifications on CT neck, likely sebaceous cyst. Partially visualized prominent upper cervical lymph nodes compatible with history of CLL. IMPRESSION: 1. No acute intracranial abnormality.  Normal MRI of the brain. 2. Enlarged upper cervical lymph nodes, partially visualized, compatible with history of CLL. Electronically Signed   By: LKristine GarbeM.D.   On: 01/01/2018 13:52   [place]ast Limited Uni Right Inc Axilla  Result Date: 12/26/2017 CLINICAL DATA:  Left subareolar breast palpable mass felt by the patient and her clinician. History of CLL. EXAM: DIGITAL DIAGNOSTIC BILATERAL MAMMOGRAM WITH CAD AND TOMO ULTRASOUND RIGHT BREAST COMPARISON:  Previous exam(s). ACR Breast  Density Category b: There are scattered areas of fibroglandular density. FINDINGS: Mammographically, there are no suspicious masses or areas of architectural distortion in the left breast. There is right nipple inversion. There is an ill-defined mass in the slightly lower outer right breast, anterior depth measuring 1.5 cm in greatest dimension mammographically. Additionally, there is a 8 mm circumscribed nodule in the right breast upper outer quadrant, middle depth. Mammographic images were processed with CAD. On physical exam, there is a moderately firm approximately 1-2 cm mass in the subareolar right breast, with slight retraction of the nipple. Targeted ultrasound is performed, showing right breast 8 o'clock subareolar location hypoechoic solid mass measuring 1.3 x 1.3 x 0.8 cm. Questionable ductal extension is seen to the nipple. In the right breast 10 o'clock 3 cm from the nipple there is a second irregular mass which measures 0.5 x 0.5 x 0.3 cm. This likely corresponds to the nodule seen mammographically in  the upper outer quadrant. In the right breast 10 o'clock 5 cm from the nipple there is a tiny 2 x 2 x 4 mm hypoechoic nodule. Ultrasound examination of the right axilla demonstrates 6 grossly abnormal lymph nodes. IMPRESSION: Right breast 8 o'clock subareolar suspicious mass, which corresponds to the area of palpable concern, with possible extension to the nipple and associated nipple retraction. Right breast 10 o'clock 3 cm from the nipple 5 mm suspicious mass. Right breast 10 o'clock 5 cm from the nipple 4 mm possible satellite nodule. Six grossly abnormal right axillary lymph nodes. No evidence of malignancy in the left breast. RECOMMENDATION: Ultrasound-guided core needle biopsy of the right breast subareolar 8 o'clock mass, 10 o'clock 3 cm from the nipple mass, and 1 of the abnormal right axillary lymph nodes. I have discussed the findings and recommendations with the patient. Results were also provided in writing at the conclusion of the visit. If applicable, a reminder letter will be sent to the patient regarding the next appointment. BI-RADS CATEGORY  5: Highly suggestive of malignancy. Electronically Signed   By: Fidela Salisbury M.D.   On: 12/26/2017 16:18   Mm Diag Breast Tomo Bilateral  Result Date: 12/26/2017 CLINICAL DATA:  Left subareolar breast palpable mass felt by the patient and her clinician. History of CLL. EXAM: DIGITAL DIAGNOSTIC BILATERAL MAMMOGRAM WITH CAD AND TOMO ULTRASOUND RIGHT BREAST COMPARISON:  Previous exam(s). ACR Breast Density Category b: There are scattered areas of fibroglandular density. FINDINGS: Mammographically, there are no suspicious masses or areas of architectural distortion in the left breast. There is right nipple inversion. There is an ill-defined mass in the slightly lower outer right breast, anterior depth measuring 1.5 cm in greatest dimension mammographically. Additionally, there is a 8 mm circumscribed nodule in the right breast upper outer quadrant,  middle depth. Mammographic images were processed with CAD. On physical exam, there is a moderately firm approximately 1-2 cm mass in the subareolar right breast, with slight retraction of the nipple. Targeted ultrasound is performed, showing right breast 8 o'clock subareolar location hypoechoic solid mass measuring 1.3 x 1.3 x 0.8 cm. Questionable ductal extension is seen to the nipple. In the right breast 10 o'clock 3 cm from the nipple there is a second irregular mass which measures 0.5 x 0.5 x 0.3 cm. This likely corresponds to the nodule seen mammographically in the upper outer quadrant. In the right breast 10 o'clock 5 cm from the nipple there is a tiny 2 x 2 x 4 mm hypoechoic nodule. Ultrasound examination of the right axilla  demonstrates 6 grossly abnormal lymph nodes. IMPRESSION: Right breast 8 o'clock subareolar suspicious mass, which corresponds to the area of palpable concern, with possible extension to the nipple and associated nipple retraction. Right breast 10 o'clock 3 cm from the nipple 5 mm suspicious mass. Right breast 10 o'clock 5 cm from the nipple 4 mm possible satellite nodule. Six grossly abnormal right axillary lymph nodes. No evidence of malignancy in the left breast. RECOMMENDATION: Ultrasound-guided core needle biopsy of the right breast subareolar 8 o'clock mass, 10 o'clock 3 cm from the nipple mass, and 1 of the abnormal right axillary lymph nodes. I have discussed the findings and recommendations with the patient. Results were also provided in writing at the conclusion of the visit. If applicable, a reminder letter will be sent to the patient regarding the next appointment. BI-RADS CATEGORY  5: Highly suggestive of malignancy. Electronically Signed   By: Fidela Salisbury M.D.   On: 12/26/2017 16:18   Mm Clip Placement Right  Result Date: 01/05/2018 CLINICAL DATA:  Patient is status post ultrasound-guided core needle biopsy of 8 o'clock position retroareolar right breast mass, a  10 o'clock positions smaller right breast mass and an abnormal right axillary lymph node. EXAM: DIAGNOSTIC RIGHT MAMMOGRAM POST ULTRASOUND BIOPSY COMPARISON:  Previous exam(s). FINDINGS: Mammographic images were obtained following ultrasound guided biopsy of 2 right breast masses and an abnormal right axillary lymph node. The coil shaped biopsy clip lies within the retroareolar breast mass. The heart shaped biopsy clip lies in the expected location of the smaller, 10 o'clock position mass. The [facility] biopsy clip use for the right axillary lymph node is not visualized on the included field of view. IMPRESSION: Well-positioned biopsy clips in the right breast following ultrasound-guided core needle biopsy 2 right breast masses. The axillary lymph node HydroMARK biopsy clip is not visualized mammographically. Final Assessment: Post Procedure Mammograms for Marker Placement Electronically Signed   By: Lajean Manes M.D.   On: 01/05/2018 09:39   [place] Rt Breast Bx W Loc Dev 1st Lesion Img Bx Spec US Guide  Result Date: 01/05/2018 CLINICAL DATA:  Patient presents for ultrasound-guided core needle biopsy of a 8 o'clock position retroareolar right breast mass, initially found by palpation. A smaller adjacent 10 o'clock position, 3 cm the nipple, mass was also discovered on diagnostic imaging, also to undergo ultrasound-guided core needle biopsy. In addition, patient had 6 enlarged abnormal right axillary lymph nodes, 1 of which will be biopsied under ultrasound guidance. Patient carries the presumptive diagnosis of CLL. EXAM: ULTRASOUND GUIDED RIGHT BREAST CORE NEEDLE BIOPSY: Lesion #1 ULTRASOUND GUIDED RIGHT BREAST CORE NEEDLE BIOPSY: Lesion #2 ULTRASOUND GUIDED RIGHT AXILLARY LYMPH NODE CORE NEEDLE BIOPSY: Lesion #3 COMPARISON:  Previous exam(s). FINDINGS: I met with the patient and we discussed the procedure of ultrasound-guided biopsy, including benefits and alternatives. We discussed the high likelihood of a  successful procedure. We discussed the risks of the procedure, including infection, bleeding, tissue injury, clip migration, and inadequate sampling. Informed written consent was given. The usual time-out protocol was performed immediately prior to the procedure. Lesion #1: 8 o'clock retroareolar. Lesion quadrant: Lower inner quadrant Using sterile technique and 1% Lidocaine as local anesthetic, under direct ultrasound visualization, a 12 gauge spring-loaded device was used to perform biopsy of the retroareolar 1.3 cm mass using a lateral approach. At the conclusion of the procedure a coil shaped tissue marker clip was deployed into the biopsy cavity. Lesion #2: 10 o'clock, 3 cm from the nipple. Lesion quadrant: Upper  outer quadrant Using sterile technique and 1% Lidocaine as local anesthetic, under direct ultrasound visualization, a 12 gauge spring-loaded device was used to perform biopsy of the 10 o'clock position mass using an inferior approach. At the conclusion of the procedure a ribbon shaped tissue marker clip was deployed into the biopsy cavity. Lesion #3: Abnormal right axillary lymph node. Using sterile technique and 1% Lidocaine as local anesthetic, under direct ultrasound visualization, a 14 gauge spring-loaded device was used to perform biopsy of 1 of the abnormal right axillary lymph nodes using an inferior approach. At the conclusion of the procedure a HydroMARK tissue marker clip was deployed into the biopsy cavity. Follow up 2 view mammogram was performed and dictated separately. IMPRESSION: Ultrasound guided biopsy of 3 lesions, 2 in the right breast and 1 abnormal right axillary lymph node. No apparent complications. Electronically Signed   By: Lajean Manes M.D.   On: 01/05/2018 09:34   [place] Rt Breast Bx W Loc Dev Ea Add Lesion Img Bx Spec US Guide  Result Date: 01/05/2018 CLINICAL DATA:  Patient presents for ultrasound-guided core needle biopsy of a 8 o'clock position retroareolar right breast  mass, initially found by palpation. A smaller adjacent 10 o'clock position, 3 cm the nipple, mass was also discovered on diagnostic imaging, also to undergo ultrasound-guided core needle biopsy. In addition, patient had 6 enlarged abnormal right axillary lymph nodes, 1 of which will be biopsied under ultrasound guidance. Patient carries the presumptive diagnosis of CLL. EXAM: ULTRASOUND GUIDED RIGHT BREAST CORE NEEDLE BIOPSY: Lesion #1 ULTRASOUND GUIDED RIGHT BREAST CORE NEEDLE BIOPSY: Lesion #2 ULTRASOUND GUIDED RIGHT AXILLARY LYMPH NODE CORE NEEDLE BIOPSY: Lesion #3 COMPARISON:  Previous exam(s). FINDINGS: I met with the patient and we discussed the procedure of ultrasound-guided biopsy, including benefits and alternatives. We discussed the high likelihood of a successful procedure. We discussed the risks of the procedure, including infection, bleeding, tissue injury, clip migration, and inadequate sampling. Informed written consent was given. The usual time-out protocol was performed immediately prior to the procedure. Lesion #1: 8 o'clock retroareolar. Lesion quadrant: Lower inner quadrant Using sterile technique and 1% Lidocaine as local anesthetic, under direct ultrasound visualization, a 12 gauge spring-loaded device was used to perform biopsy of the retroareolar 1.3 cm mass using a lateral approach. At the conclusion of the procedure a coil shaped tissue marker clip was deployed into the biopsy cavity. Lesion #2: 10 o'clock, 3 cm from the nipple. Lesion quadrant: Upper outer quadrant Using sterile technique and 1% Lidocaine as local anesthetic, under direct ultrasound visualization, a 12 gauge spring-loaded device was used to perform biopsy of the 10 o'clock position mass using an inferior approach. At the conclusion of the procedure a ribbon shaped tissue marker clip was deployed into the biopsy cavity. Lesion #3: Abnormal right axillary lymph node. Using sterile technique and 1% Lidocaine as local  anesthetic, under direct ultrasound visualization, a 14 gauge spring-loaded device was used to perform biopsy of 1 of the abnormal right axillary lymph nodes using an inferior approach. At the conclusion of the procedure a HydroMARK tissue marker clip was deployed into the biopsy cavity. Follow up 2 view mammogram was performed and dictated separately. IMPRESSION: Ultrasound guided biopsy of 3 lesions, 2 in the right breast and 1 abnormal right axillary lymph node. No apparent complications. Electronically Signed   By: Lajean Manes M.D.   On: 01/05/2018 09:34   [place] Rt Breast Bx W Loc Dev Ea Add Lesion Img Bx Spec US Guide  Result Date: 01/05/2018 CLINICAL DATA:  Patient presents for ultrasound-guided core needle biopsy of a 8 o'clock position retroareolar right breast mass, initially found by palpation. A smaller adjacent 10 o'clock position, 3 cm the nipple, mass was also discovered on diagnostic imaging, also to undergo ultrasound-guided core needle biopsy. In addition, patient had 6 enlarged abnormal right axillary lymph nodes, 1 of which will be biopsied under ultrasound guidance. Patient carries the presumptive diagnosis of CLL. EXAM: ULTRASOUND GUIDED RIGHT BREAST CORE NEEDLE BIOPSY: Lesion #1 ULTRASOUND GUIDED RIGHT BREAST CORE NEEDLE BIOPSY: Lesion #2 ULTRASOUND GUIDED RIGHT AXILLARY LYMPH NODE CORE NEEDLE BIOPSY: Lesion #3 COMPARISON:  Previous exam(s). FINDINGS: I met with the patient and we discussed the procedure of ultrasound-guided biopsy, including benefits and alternatives. We discussed the high likelihood of a successful procedure. We discussed the risks of the procedure, including infection, bleeding, tissue injury, clip migration, and inadequate sampling. Informed written consent was given. The usual time-out protocol was performed immediately prior to the procedure. Lesion #1: 8 o'clock retroareolar. Lesion quadrant: Lower inner quadrant Using sterile technique and 1% Lidocaine as local  anesthetic, under direct ultrasound visualization, a 12 gauge spring-loaded device was used to perform biopsy of the retroareolar 1.3 cm mass using a lateral approach. At the conclusion of the procedure a coil shaped tissue marker clip was deployed into the biopsy cavity. Lesion #2: 10 o'clock, 3 cm from the nipple. Lesion quadrant: Upper outer quadrant Using sterile technique and 1% Lidocaine as local anesthetic, under direct ultrasound visualization, a 12 gauge spring-loaded device was used to perform biopsy of the 10 o'clock position mass using an inferior approach. At the conclusion of the procedure a ribbon shaped tissue marker clip was deployed into the biopsy cavity. Lesion #3: Abnormal right axillary lymph node. Using sterile technique and 1% Lidocaine as local anesthetic, under direct ultrasound visualization, a 14 gauge spring-loaded device was used to perform biopsy of 1 of the abnormal right axillary lymph nodes using an inferior approach. At the conclusion of the procedure a HydroMARK tissue marker clip was deployed into the biopsy cavity. Follow up 2 view mammogram was performed and dictated separately. IMPRESSION: Ultrasound guided biopsy of 3 lesions, 2 in the right breast and 1 abnormal right axillary lymph node. No apparent complications. Electronically Signed   By: Lajean Manes M.D.   On: 01/05/2018 09:34    ASSESSMENT: CLL, Rai stage 1, breast mass  PLAN:    1. CLL: Confirmed by peripheral blood flow cytometry.  Patient possibly has progressive disease with new lymphadenopathy.  Will hold treatment or imaging at this time until her new breast mass can be further evaluated. She completed cycle 4 of weekly Rituxan on February 20, 2017. No intervention is needed at this time. She likely will require more aggressive treatment in the future possibly using Treanda plus Rituxan. 2. Lymphadenopathy: Secondary CLL.  3.  Leukocytosis: Secondary to CLL. 4.  Breast mass: Patient has biopsy scheduled on  Monday.  Return to clinic in 1 week to discuss the results and treatment planning if necessary.  Approximately 30 minutes was spent in discussion of which greater than 50% was consultation.  Patient expressed understanding and was in agreement with this plan. She also understands that She can call clinic at any time with any questions, concerns, or complaints.    Lloyd Huger, MD   01/06/2018 2:16 PM

## 2018-01-01 ENCOUNTER — Ambulatory Visit
Admission: RE | Admit: 2018-01-01 | Discharge: 2018-01-01 | Disposition: A | Discharge status: Home / Self Care | Payer: BLUE CROSS/BLUE SHIELD | Source: Ambulatory Visit | Attending: Neurology | Admitting: Neurology

## 2018-01-01 ENCOUNTER — Inpatient Hospital Stay: Payer: BLUE CROSS/BLUE SHIELD | Attending: Oncology

## 2018-01-01 ENCOUNTER — Inpatient Hospital Stay (HOSPITAL_BASED_OUTPATIENT_CLINIC_OR_DEPARTMENT_OTHER): Payer: BLUE CROSS/BLUE SHIELD | Admitting: Oncology

## 2018-01-01 ENCOUNTER — Encounter: Payer: Self-pay | Admitting: Oncology

## 2018-01-01 VITALS — BP 103/67 | HR 76 | Temp 98.0°F | Resp 18 | Ht 67.0 in | Wt 151.0 lb

## 2018-01-01 DIAGNOSIS — N63 Unspecified lump in unspecified breast: Secondary | ICD-10-CM

## 2018-01-01 DIAGNOSIS — D72829 Elevated white blood cell count, unspecified: Secondary | ICD-10-CM | POA: Insufficient documentation

## 2018-01-01 DIAGNOSIS — R599 Enlarged lymph nodes, unspecified: Secondary | ICD-10-CM | POA: Diagnosis not present

## 2018-01-01 DIAGNOSIS — R200 Anesthesia of skin: Secondary | ICD-10-CM | POA: Diagnosis not present

## 2018-01-01 DIAGNOSIS — C9111 Chronic lymphocytic leukemia of B-cell type in remission: Secondary | ICD-10-CM | POA: Insufficient documentation

## 2018-01-01 DIAGNOSIS — C9110 Chronic lymphocytic leukemia of B-cell type not having achieved remission: Secondary | ICD-10-CM

## 2018-01-01 DIAGNOSIS — R590 Localized enlarged lymph nodes: Secondary | ICD-10-CM | POA: Insufficient documentation

## 2018-01-01 DIAGNOSIS — R51 Headache: Secondary | ICD-10-CM | POA: Diagnosis not present

## 2018-01-01 LAB — CBC WITH DIFFERENTIAL/PLATELET
BASOS ABS: 0.1 10*3/uL (ref 0–0.1)
Basophils Relative: 0 %
EOS PCT: 0 %
Eosinophils Absolute: 0.1 10*3/uL (ref 0–0.7)
HEMATOCRIT: 37.4 % (ref 35.0–47.0)
Hemoglobin: 12.6 g/dL (ref 12.0–16.0)
LYMPHS ABS: 28.5 10*3/uL — AB (ref 1.0–3.6)
LYMPHS PCT: 87 %
MCH: 32.9 pg (ref 26.0–34.0)
MCHC: 33.6 g/dL (ref 32.0–36.0)
MCV: 97.8 fL (ref 80.0–100.0)
MONO ABS: 0.6 10*3/uL (ref 0.2–0.9)
MONOS PCT: 2 %
NEUTROS ABS: 3.5 10*3/uL (ref 1.4–6.5)
Neutrophils Relative %: 11 %
PLATELETS: 200 10*3/uL (ref 150–440)
RBC: 3.82 MIL/uL (ref 3.80–5.20)
RDW: 13.2 % (ref 11.5–14.5)
WBC: 32.7 10*3/uL — ABNORMAL HIGH (ref 3.6–11.0)

## 2018-01-01 MED ORDER — GADOBENATE DIMEGLUMINE 529 MG/ML IV SOLN
15.0000 mL | Freq: Once | INTRAVENOUS | Status: AC | PRN
  Administered 2018-01-01: 14 mL via INTRAVENOUS

## 2018-01-01 NOTE — Progress Notes (Signed)
New nodules noted to both side of neck with pulling sensation from the back of her neck

## 2018-01-05 ENCOUNTER — Ambulatory Visit
Admission: RE | Admit: 2018-01-05 | Discharge: 2018-01-05 | Disposition: A | Discharge status: Home / Self Care | Payer: BLUE CROSS/BLUE SHIELD | Source: Ambulatory Visit | Attending: Oncology | Admitting: Oncology

## 2018-01-05 DIAGNOSIS — N6313 Unspecified lump in the right breast, lower outer quadrant: Secondary | ICD-10-CM | POA: Diagnosis not present

## 2018-01-05 DIAGNOSIS — N6341 Unspecified lump in right breast, subareolar: Secondary | ICD-10-CM | POA: Diagnosis not present

## 2018-01-05 DIAGNOSIS — N6311 Unspecified lump in the right breast, upper outer quadrant: Secondary | ICD-10-CM | POA: Diagnosis not present

## 2018-01-05 DIAGNOSIS — N6310 Unspecified lump in the right breast, unspecified quadrant: Secondary | ICD-10-CM

## 2018-01-05 DIAGNOSIS — R928 Other abnormal and inconclusive findings on diagnostic imaging of breast: Secondary | ICD-10-CM

## 2018-01-05 DIAGNOSIS — R599 Enlarged lymph nodes, unspecified: Secondary | ICD-10-CM | POA: Diagnosis not present

## 2018-01-05 DIAGNOSIS — R590 Localized enlarged lymph nodes: Secondary | ICD-10-CM | POA: Diagnosis not present

## 2018-01-05 DIAGNOSIS — N6011 Diffuse cystic mastopathy of right breast: Secondary | ICD-10-CM | POA: Diagnosis not present

## 2018-01-05 HISTORY — PX: BREAST BIOPSY: SHX20

## 2018-01-07 DIAGNOSIS — N6310 Unspecified lump in the right breast, unspecified quadrant: Secondary | ICD-10-CM | POA: Insufficient documentation

## 2018-01-07 NOTE — Progress Notes (Signed)
Monique [name]  [phone] [phone]  ID: Monique [name] OB: 23-Mar-1971  MR#: [id number]  LKT#:[id number]  Patient Care Team: [name], MD as PCP - General (Family Medicine)  CHIEF COMPLAINT: CLL, breast mass.  INTERVAL HISTORY: Patient returns to clinic today for further evaluation, discussion of her pathology results, and treatment planning.  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  She is anxious, but otherwise feels well.  She has no neurologic complaints. She denies any fevers, illnesses, or weight loss. She has no chest pain or shortness of breath. She denies any nausea, vomiting, constipation, or diarrhea. She has no urinary complaints. Patient offers no further specific complaints today.  REVIEW OF SYSTEMS:   Review of Systems  Constitutional: Negative.  Negative for fever, malaise/fatigue and weight loss.  HENT: Negative.  Negative for congestion.   Respiratory: Negative for cough and shortness of breath.   Cardiovascular: Negative.  Negative for chest pain and leg swelling.  Gastrointestinal: Negative.  Negative for abdominal pain and constipation.  Genitourinary: Negative.  Negative for dysuria.  Musculoskeletal: Positive for myalgias and neck pain.  Skin: Negative.  Negative for rash.  Neurological: Negative.  Negative for sensory change, focal weakness and weakness.  Psychiatric/Behavioral: The patient is nervous/anxious.     As per HPI. Otherwise, a complete review of systems is negative.  PAST MEDICAL HISTORY: Past Medical History:  Diagnosis Date  . Anxiety   . Cervical cancer ([place])    hx LEEP over 18 years ago.   . Cervical intraepithelial neoplasia I   . Chronic lymphocytic leukemia ([place])   . Depression   . Eating disorder   . History of self-harm   . Insomnia   . Obsession   . Pap smear abnormality of cervix with LGSIL   . Tobacco abuse   .  Vitamin B12 deficiency (non anemic)     PAST SURGICAL HISTORY: Past Surgical History:  Procedure Laterality Date  . BREAST BIOPSY Right 01/05/2018   US guided biopsy of 2 areas and 1 lymph node  . CERVICAL BIOPSY  W/ LOOP ELECTRODE EXCISION    . TUBAL LIGATION      FAMILY HISTORY: Family History  Problem Relation Age of Onset  . Cancer Mother        thyroid  . Alcohol abuse Father   . Depression Sister   . Cancer Sister        cevical  . Alcohol abuse Brother   . Depression Brother   . Bipolar disorder Brother   . ADD / ADHD Son   . Breast cancer Neg Hx     ADVANCED DIRECTIVES (Y/N):  N  HEALTH MAINTENANCE: Social History   Tobacco Use  . Smoking status: Current Every Day Smoker    Packs/day: 1.00    Years: 31.00    Pack years: 31.00    Start date: 09/25/1986  . Smokeless tobacco: Never Used  . Tobacco comment: Would like to discuss Chantix- she forgot to take this medication  Substance Use Topics  . Alcohol use: Yes    Alcohol/week: 0.0 oz    Comment: rarely  . Drug use: No     Colonoscopy:  PAP:  Bone density:  Lipid panel:  No Known Allergies  Current Outpatient Medications  Medication Sig Dispense Refill  . escitalopram (LEXAPRO) 10 MG tablet     . gabapentin (NEURONTIN) 100 MG capsule Take 100 mg twice a day for  one week, then increase to 200 mg(2 tablets) twice a day and continue    . umeclidinium-vilanterol (ANORO ELLIPTA) 62.5-25 MCG/INH AEPB TAKE 1 PUFF BY MOUTH EVERY DAY 60 each 5  . carbamazepine (TEGRETOL) 200 MG tablet TAKE 1 TABLET (200 MG TOTAL) BY MOUTH 3 (THREE) TIMES DAILY. 90 tablet 0  . CHANTIX CONTINUING MONTH PAK 1 MG tablet TAKE 1 TABLET (1 MG TOTAL) BY MOUTH 2 (TWO) TIMES DAILY. FILL STARTING KIT FIRST 56 tablet 2  . doxycycline (VIBRA-TABS) 100 MG tablet Take 1 tablet (100 mg total) by mouth 2 (two) times daily. 14 tablet 0  . traMADol (ULTRAM) 50 MG tablet Take by mouth every 6 (six) hours as needed.     No current  facility-administered medications for this visit.     OBJECTIVE: Vitals:   01/08/18 1414  BP: 108/72  Pulse: 84  Resp: 18  Temp: (!) 95.6 F (35.3 C)     Body mass index is 23.71 kg/m.    ECOG FS:0 - Asymptomatic  General: Well-developed, well-nourished, no acute distress. Eyes: Pink conjunctiva, anicteric sclera. HEENT: Normocephalic, moist mucous membranes, clear oropharnyx. Breast: Exam deferred today. Lungs: Clear to auscultation bilaterally. Heart: Regular rate and rhythm. No rubs, murmurs, or gallops. Abdomen: Soft, nontender, nondistended. No organomegaly noted, normoactive bowel sounds. Musculoskeletal: No edema, cyanosis, or clubbing. Neuro: Alert, answering all questions appropriately. Cranial nerves grossly intact. Skin: No rashes or petechiae noted. Psych: Normal affect. Lymphatics: Palpable cervical and axillary adenopathy.  LAB RESULTS:  Lab Results  Component Value Date   NA 141 12/04/2017   K 4.4 12/04/2017   CL 103 12/04/2017   CO2 30 12/04/2017   GLUCOSE 85 12/04/2017   BUN 19 12/04/2017   CREATININE 1.07 12/04/2017   CALCIUM 9.0 12/04/2017   PROT 6.4 12/04/2017   ALBUMIN 4.0 10/01/2017   AST 22 12/04/2017   ALT 16 12/04/2017   ALKPHOS 46 10/01/2017   BILITOT 0.3 12/04/2017   GFRNONAA 61 12/04/2017   GFRAA 71 12/04/2017    Lab Results  Component Value Date   WBC 32.7 (H) 01/01/2018   NEUTROABS 3.5 01/01/2018   HGB 12.6 01/01/2018   HCT 37.4 01/01/2018   MCV 97.8 01/01/2018   PLT 200 01/01/2018     STUDIES: Mr Jeri Cos TG Contrast  Result Date: 01/01/2018 CLINICAL DATA:  47 y/o F; history of chronic lymphocytic leukemia. Posterior headache with sharp pains, left leg weakness, and right facial pain with numbness for 1 month. EXAM: MRI HEAD WITHOUT AND WITH CONTRAST TECHNIQUE: Multiplanar, multiecho pulse sequences of the brain and surrounding structures were obtained without and with intravenous contrast. CONTRAST:  68m MULTIHANCE  GADOBENATE DIMEGLUMINE 529 MG/ML IV SOLN COMPARISON:  10/01/2017 CT of the neck. FINDINGS: Brain: No acute infarction, hemorrhage, hydrocephalus, extra-axial collection or mass lesion. No abnormal enhancement. Vascular: Normal flow voids. Skull and upper cervical spine: Normal marrow signal. Sinuses/Orbits: Negative. Other: Left suboccipital scalp 12 mm low signal focus with calcifications on CT neck, likely sebaceous cyst. Partially visualized prominent upper cervical lymph nodes compatible with history of CLL. IMPRESSION: 1. No acute intracranial abnormality.  Normal MRI of the brain. 2. Enlarged upper cervical lymph nodes, partially visualized, compatible with history of CLL. Electronically Signed   By: LKristine GarbeM.D.   On: 01/01/2018 13:52   [place]Breast Limited Uni Right Inc Axilla  Result Date: 12/26/2017 CLINICAL DATA:  Left subareolar breast palpable mass felt by the patient and her clinician. History of CLL. EXAM: DIGITAL  DIAGNOSTIC BILATERAL MAMMOGRAM WITH CAD AND TOMO ULTRASOUND RIGHT BREAST COMPARISON:  Previous exam(s). ACR Breast Density Category b: There are scattered areas of fibroglandular density. FINDINGS: Mammographically, there are no suspicious masses or areas of architectural distortion in the left breast. There is right nipple inversion. There is an ill-defined mass in the slightly lower outer right breast, anterior depth measuring 1.5 cm in greatest dimension mammographically. Additionally, there is a 8 mm circumscribed nodule in the right breast upper outer quadrant, middle depth. Mammographic images were processed with CAD. On physical exam, there is a moderately firm approximately 1-2 cm mass in the subareolar right breast, with slight retraction of the nipple. Targeted ultrasound is performed, showing right breast 8 o'clock subareolar location hypoechoic solid mass measuring 1.3 x 1.3 x 0.8 cm. Questionable ductal extension is seen to the nipple. In the right breast 10  o'clock 3 cm from the nipple there is a second irregular mass which measures 0.5 x 0.5 x 0.3 cm. This likely corresponds to the nodule seen mammographically in the upper outer quadrant. In the right breast 10 o'clock 5 cm from the nipple there is a tiny 2 x 2 x 4 mm hypoechoic nodule. Ultrasound examination of the right axilla demonstrates 6 grossly abnormal lymph nodes. IMPRESSION: Right breast 8 o'clock subareolar suspicious mass, which corresponds to the area of palpable concern, with possible extension to the nipple and associated nipple retraction. Right breast 10 o'clock 3 cm from the nipple 5 mm suspicious mass. Right breast 10 o'clock 5 cm from the nipple 4 mm possible satellite nodule. Six grossly abnormal right axillary lymph nodes. No evidence of malignancy in the left breast. RECOMMENDATION: Ultrasound-guided core needle biopsy of the right breast subareolar 8 o'clock mass, 10 o'clock 3 cm from the nipple mass, and 1 of the abnormal right axillary lymph nodes. I have discussed the findings and recommendations with the patient. Results were also provided in writing at the conclusion of the visit. If applicable, a reminder letter will be sent to the patient regarding the next appointment. BI-RADS CATEGORY  5: Highly suggestive of malignancy. Electronically Signed   By: Fidela Salisbury M.D.   On: 12/26/2017 16:18   Mm Diag Breast Tomo Bilateral  Result Date: 12/26/2017 CLINICAL DATA:  Left subareolar breast palpable mass felt by the patient and her clinician. History of CLL. EXAM: DIGITAL DIAGNOSTIC BILATERAL MAMMOGRAM WITH CAD AND TOMO ULTRASOUND RIGHT BREAST COMPARISON:  Previous exam(s). ACR Breast Density Category b: There are scattered areas of fibroglandular density. FINDINGS: Mammographically, there are no suspicious masses or areas of architectural distortion in the left breast. There is right nipple inversion. There is an ill-defined mass in the slightly lower outer right breast, anterior  depth measuring 1.5 cm in greatest dimension mammographically. Additionally, there is a 8 mm circumscribed nodule in the right breast upper outer quadrant, middle depth. Mammographic images were processed with CAD. On physical exam, there is a moderately firm approximately 1-2 cm mass in the subareolar right breast, with slight retraction of the nipple. Targeted ultrasound is performed, showing right breast 8 o'clock subareolar location hypoechoic solid mass measuring 1.3 x 1.3 x 0.8 cm. Questionable ductal extension is seen to the nipple. In the right breast 10 o'clock 3 cm from the nipple there is a second irregular mass which measures 0.5 x 0.5 x 0.3 cm. This likely corresponds to the nodule seen mammographically in the upper outer quadrant. In the right breast 10 o'clock 5 cm from the nipple there is  a tiny 2 x 2 x 4 mm hypoechoic nodule. Ultrasound examination of the right axilla demonstrates 6 grossly abnormal lymph nodes. IMPRESSION: Right breast 8 o'clock subareolar suspicious mass, which corresponds to the area of palpable concern, with possible extension to the nipple and associated nipple retraction. Right breast 10 o'clock 3 cm from the nipple 5 mm suspicious mass. Right breast 10 o'clock 5 cm from the nipple 4 mm possible satellite nodule. Six grossly abnormal right axillary lymph nodes. No evidence of malignancy in the left breast. RECOMMENDATION: Ultrasound-guided core needle biopsy of the right breast subareolar 8 o'clock mass, 10 o'clock 3 cm from the nipple mass, and 1 of the abnormal right axillary lymph nodes. I have discussed the findings and recommendations with the patient. Results were also provided in writing at the conclusion of the visit. If applicable, a reminder letter will be sent to the patient regarding the next appointment. BI-RADS CATEGORY  5: Highly suggestive of malignancy. Electronically Signed   By: Fidela Salisbury M.D.   On: 12/26/2017 16:18   Mm Clip Placement  Right  Result Date: 01/05/2018 CLINICAL DATA:  Patient is status post ultrasound-guided core needle biopsy of 8 o'clock position retroareolar right breast mass, a 10 o'clock positions smaller right breast mass and an abnormal right axillary lymph node. EXAM: DIAGNOSTIC RIGHT MAMMOGRAM POST ULTRASOUND BIOPSY COMPARISON:  Previous exam(s). FINDINGS: Mammographic images were obtained following ultrasound guided biopsy of 2 right breast masses and an abnormal right axillary lymph node. The coil shaped biopsy clip lies within the retroareolar breast mass. The heart shaped biopsy clip lies in the expected location of the smaller, 10 o'clock position mass. The [facility] biopsy clip use for the right axillary lymph node is not visualized on the included field of view. IMPRESSION: Well-positioned biopsy clips in the right breast following ultrasound-guided core needle biopsy 2 right breast masses. The axillary lymph node HydroMARK biopsy clip is not visualized mammographically. Final Assessment: Post Procedure Mammograms for Marker Placement Electronically Signed   By: Lajean Manes M.D.   On: 01/05/2018 09:39   [place] Rt Breast Bx W Loc Dev 1st Lesion Img Bx Spec US Guide  Addendum Date: 01/08/2018   ADDENDUM REPORT: 01/08/2018 09:38 ADDENDUM: The 8 o'clock subareolar mass biopsy demonstrates no atypia or malignancy. 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 The findings are concordant. Recommend surgical consultation for management. I favor granulomatous mastitis over infection given the lack of acute symptoms prior to the biopsy. The mass at 10 o'clock, 3 cm from the nipple demonstrates fibrocystic changes. Imaging and pathology findings are concordant. No follow-up necessary in this region. 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 The findings are concordant. Recommend continued oncologic follow up. The patient  also had a mass at 10 o'clock, 5 cm from the nipple which was not biopsied. Recommend a six-month follow-up ultrasound to ensure stability or resolution. These findings will be called to the patient today. Electronically Signed   By: Dorise Bullion III M.D   On: 01/08/2018 09:38   Result Date: 01/08/2018 CLINICAL DATA:  Patient presents for ultrasound-guided core needle biopsy of a 8 o'clock position retroareolar right breast mass, initially found by palpation. A smaller adjacent 10 o'clock position, 3 cm the nipple, mass was also discovered on diagnostic imaging, also to undergo ultrasound-guided core needle biopsy. In addition, patient had 6 enlarged abnormal right axillary lymph nodes, 1 of which will be biopsied under ultrasound  guidance. Patient carries the presumptive diagnosis of CLL. EXAM: ULTRASOUND GUIDED RIGHT BREAST CORE NEEDLE BIOPSY: Lesion #1 ULTRASOUND GUIDED RIGHT BREAST CORE NEEDLE BIOPSY: Lesion #2 ULTRASOUND GUIDED RIGHT AXILLARY LYMPH NODE CORE NEEDLE BIOPSY: Lesion #3 COMPARISON:  Previous exam(s). FINDINGS: I met with the patient and we discussed the procedure of ultrasound-guided biopsy, including benefits and alternatives. We discussed the high likelihood of a successful procedure. We discussed the risks of the procedure, including infection, bleeding, tissue injury, clip migration, and inadequate sampling. Informed written consent was given. The usual time-out protocol was performed immediately prior to the procedure. Lesion #1: 8 o'clock retroareolar. Lesion quadrant: Lower inner quadrant Using sterile technique and 1% Lidocaine as local anesthetic, under direct ultrasound visualization, a 12 gauge spring-loaded device was used to perform biopsy of the retroareolar 1.3 cm mass using a lateral approach. At the conclusion of the procedure a coil shaped tissue marker clip was deployed into the biopsy cavity. Lesion #2: 10 o'clock, 3 cm from the nipple. Lesion quadrant: Upper outer quadrant  Using sterile technique and 1% Lidocaine as local anesthetic, under direct ultrasound visualization, a 12 gauge spring-loaded device was used to perform biopsy of the 10 o'clock position mass using an inferior approach. At the conclusion of the procedure a ribbon shaped tissue marker clip was deployed into the biopsy cavity. Lesion #3: Abnormal right axillary lymph node. Using sterile technique and 1% Lidocaine as local anesthetic, under direct ultrasound visualization, a 14 gauge spring-loaded device was used to perform biopsy of 1 of the abnormal right axillary lymph nodes using an inferior approach. At the conclusion of the procedure a HydroMARK tissue marker clip was deployed into the biopsy cavity. Follow up 2 view mammogram was performed and dictated separately. IMPRESSION: Ultrasound guided biopsy of 3 lesions, 2 in the right breast and 1 abnormal right axillary lymph node. No apparent complications. Electronically Signed: By: Lajean Manes M.D. On: 01/05/2018 09:34   [place] Rt Breast Bx W Loc Dev Ea Add Lesion Img Bx Spec US Guide  Addendum Date: 01/08/2018   ADDENDUM REPORT: 01/08/2018 09:38 ADDENDUM: The 8 o'clock subareolar mass biopsy demonstrates no atypia or malignancy. 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 The findings are concordant. Recommend surgical consultation for management. I favor granulomatous mastitis over infection given the lack of acute symptoms prior to the biopsy. The mass at 10 o'clock, 3 cm from the nipple demonstrates fibrocystic changes. Imaging and pathology findings are concordant. No follow-up necessary in this region. 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 The findings are concordant. Recommend continued oncologic follow up. The patient also had a mass at 10 o'clock, 5 cm from the nipple which was not biopsied. Recommend a six-month follow-up ultrasound to ensure  stability or resolution. These findings will be called to the patient today. Electronically Signed   By: Dorise Bullion III M.D   On: 01/08/2018 09:38   Result Date: 01/08/2018 CLINICAL DATA:  Patient presents for ultrasound-guided core needle biopsy of a 8 o'clock position retroareolar right breast mass, initially found by palpation. A smaller adjacent 10 o'clock position, 3 cm the nipple, mass was also discovered on diagnostic imaging, also to undergo ultrasound-guided core needle biopsy. In addition, patient had 6 enlarged abnormal right axillary lymph nodes, 1 of which will be biopsied under ultrasound guidance. Patient carries the presumptive diagnosis of CLL. EXAM: ULTRASOUND GUIDED RIGHT BREAST CORE NEEDLE BIOPSY: Lesion #1 ULTRASOUND GUIDED RIGHT BREAST  CORE NEEDLE BIOPSY: Lesion #2 ULTRASOUND GUIDED RIGHT AXILLARY LYMPH NODE CORE NEEDLE BIOPSY: Lesion #3 COMPARISON:  Previous exam(s). FINDINGS: I met with the patient and we discussed the procedure of ultrasound-guided biopsy, including benefits and alternatives. We discussed the high likelihood of a successful procedure. We discussed the risks of the procedure, including infection, bleeding, tissue injury, clip migration, and inadequate sampling. Informed written consent was given. The usual time-out protocol was performed immediately prior to the procedure. Lesion #1: 8 o'clock retroareolar. Lesion quadrant: Lower inner quadrant Using sterile technique and 1% Lidocaine as local anesthetic, under direct ultrasound visualization, a 12 gauge spring-loaded device was used to perform biopsy of the retroareolar 1.3 cm mass using a lateral approach. At the conclusion of the procedure a coil shaped tissue marker clip was deployed into the biopsy cavity. Lesion #2: 10 o'clock, 3 cm from the nipple. Lesion quadrant: Upper outer quadrant Using sterile technique and 1% Lidocaine as local anesthetic, under direct ultrasound visualization, a 12 gauge spring-loaded  device was used to perform biopsy of the 10 o'clock position mass using an inferior approach. At the conclusion of the procedure a ribbon shaped tissue marker clip was deployed into the biopsy cavity. Lesion #3: Abnormal right axillary lymph node. Using sterile technique and 1% Lidocaine as local anesthetic, under direct ultrasound visualization, a 14 gauge spring-loaded device was used to perform biopsy of 1 of the abnormal right axillary lymph nodes using an inferior approach. At the conclusion of the procedure a HydroMARK tissue marker clip was deployed into the biopsy cavity. Follow up 2 view mammogram was performed and dictated separately. IMPRESSION: Ultrasound guided biopsy of 3 lesions, 2 in the right breast and 1 abnormal right axillary lymph node. No apparent complications. Electronically Signed: By: Lajean Manes M.D. On: 01/05/2018 09:34   [place] Rt Breast Bx W Loc Dev Ea Add Lesion Img Bx Spec US Guide  Addendum Date: 01/08/2018   ADDENDUM REPORT: 01/08/2018 09:38 ADDENDUM: The 8 o'clock subareolar mass biopsy demonstrates no atypia or malignancy. 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 The findings are concordant. Recommend surgical consultation for management. I favor granulomatous mastitis over infection given the lack of acute symptoms prior to the biopsy. The mass at 10 o'clock, 3 cm from the nipple demonstrates fibrocystic changes. Imaging and pathology findings are concordant. No follow-up necessary in this region. 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 The findings are concordant. Recommend continued oncologic follow up. The patient also had a mass at 10 o'clock, 5 cm from the nipple which was not biopsied. Recommend a six-month follow-up ultrasound to ensure stability or resolution. These findings will be called to the patient today. Electronically Signed   By: Dorise Bullion III  M.D   On: 01/08/2018 09:38   Result Date: 01/08/2018 CLINICAL DATA:  Patient presents for ultrasound-guided core needle biopsy of a 8 o'clock position retroareolar right breast mass, initially found by palpation. A smaller adjacent 10 o'clock position, 3 cm the nipple, mass was also discovered on diagnostic imaging, also to undergo ultrasound-guided core needle biopsy. In addition, patient had 6 enlarged abnormal right axillary lymph nodes, 1 of which will be biopsied under ultrasound guidance. Patient carries the presumptive diagnosis of CLL. EXAM: ULTRASOUND GUIDED RIGHT BREAST CORE NEEDLE BIOPSY: Lesion #1 ULTRASOUND GUIDED RIGHT BREAST CORE NEEDLE BIOPSY: Lesion #2 ULTRASOUND GUIDED RIGHT AXILLARY LYMPH NODE CORE NEEDLE BIOPSY: Lesion #3 COMPARISON:  Previous exam(s). FINDINGS: I  met with the patient and we discussed the procedure of ultrasound-guided biopsy, including benefits and alternatives. We discussed the high likelihood of a successful procedure. We discussed the risks of the procedure, including infection, bleeding, tissue injury, clip migration, and inadequate sampling. Informed written consent was given. The usual time-out protocol was performed immediately prior to the procedure. Lesion #1: 8 o'clock retroareolar. Lesion quadrant: Lower inner quadrant Using sterile technique and 1% Lidocaine as local anesthetic, under direct ultrasound visualization, a 12 gauge spring-loaded device was used to perform biopsy of the retroareolar 1.3 cm mass using a lateral approach. At the conclusion of the procedure a coil shaped tissue marker clip was deployed into the biopsy cavity. Lesion #2: 10 o'clock, 3 cm from the nipple. Lesion quadrant: Upper outer quadrant Using sterile technique and 1% Lidocaine as local anesthetic, under direct ultrasound visualization, a 12 gauge spring-loaded device was used to perform biopsy of the 10 o'clock position mass using an inferior approach. At the conclusion of the  procedure a ribbon shaped tissue marker clip was deployed into the biopsy cavity. Lesion #3: Abnormal right axillary lymph node. Using sterile technique and 1% Lidocaine as local anesthetic, under direct ultrasound visualization, a 14 gauge spring-loaded device was used to perform biopsy of 1 of the abnormal right axillary lymph nodes using an inferior approach. At the conclusion of the procedure a HydroMARK tissue marker clip was deployed into the biopsy cavity. Follow up 2 view mammogram was performed and dictated separately. IMPRESSION: Ultrasound guided biopsy of 3 lesions, 2 in the right breast and 1 abnormal right axillary lymph node. No apparent complications. Electronically Signed: By: Lajean Manes M.D. On: 01/05/2018 09:34    ASSESSMENT: CLL, Rai stage 1, breast mass  PLAN:    1. CLL: Confirmed by peripheral blood flow cytometry.  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  Will get CT scan of the neck, chest, abdomen, and pelvis for baseline imaging prior to initiating treatment with Rituxan and Treanda on Jan 21, 2018.  Previously, patient completed 4 cycles of weekly Rituxan on February 20, 2017.  Return to clinic on May 8 for consideration of cycle 1, day 1.   2. Lymphadenopathy: Secondary CLL.  3.  Leukocytosis: Secondary to CLL. 4.  Breast mass: Pathology results reviewed independently reporting benign disease.  Monitor.  Approximately 30 minutes was spent in discussion of which greater than 50% was consultation.  Patient expressed understanding and was in agreement with this plan. She also understands that She can call clinic at any time with any questions, concerns, or complaints.    Lloyd Huger, MD   01/10/2018 11:34 AM

## 2018-01-08 ENCOUNTER — Other Ambulatory Visit: Payer: Self-pay

## 2018-01-08 ENCOUNTER — Encounter: Payer: Self-pay | Admitting: Diagnostic Radiology

## 2018-01-08 ENCOUNTER — Inpatient Hospital Stay (HOSPITAL_BASED_OUTPATIENT_CLINIC_OR_DEPARTMENT_OTHER): Payer: BLUE CROSS/BLUE SHIELD | Admitting: Oncology

## 2018-01-08 VITALS — BP 108/72 | HR 84 | Temp 95.6°F | Resp 18 | Wt 151.4 lb

## 2018-01-08 DIAGNOSIS — N63 Unspecified lump in unspecified breast: Secondary | ICD-10-CM | POA: Diagnosis not present

## 2018-01-08 DIAGNOSIS — R599 Enlarged lymph nodes, unspecified: Secondary | ICD-10-CM | POA: Diagnosis not present

## 2018-01-08 DIAGNOSIS — N6310 Unspecified lump in the right breast, unspecified quadrant: Secondary | ICD-10-CM | POA: Diagnosis not present

## 2018-01-08 DIAGNOSIS — Z720 Tobacco use: Secondary | ICD-10-CM

## 2018-01-08 DIAGNOSIS — C9110 Chronic lymphocytic leukemia of B-cell type not having achieved remission: Secondary | ICD-10-CM | POA: Diagnosis not present

## 2018-01-08 DIAGNOSIS — D72829 Elevated white blood cell count, unspecified: Secondary | ICD-10-CM | POA: Diagnosis not present

## 2018-01-08 NOTE — Progress Notes (Signed)
Here for follow up-stated" Im doing fine -just want to know what s going on next.Marland KitchenMarland Kitchen"

## 2018-01-09 ENCOUNTER — Other Ambulatory Visit: Payer: Self-pay | Admitting: Oncology

## 2018-01-09 LAB — SURGICAL PATHOLOGY

## 2018-01-09 MED ORDER — DOXYCYCLINE HYCLATE 100 MG PO TABS: 100.0000 mg | ORAL_TABLET | Freq: Two times a day (BID) | ORAL | 0 refills | Status: DC

## 2018-01-09 NOTE — Progress Notes (Signed)
Doxycycline 100 mg twice daily for 7 days called into pharmacy for recent breast biopsy results.   Biopsy revealed granulomatous mastitis.   Consulted with Dr. again and he is in agreement.  Patient notified.  Faythe Casa, NP 01/09/2018 3:58 PM

## 2018-01-10 ENCOUNTER — Other Ambulatory Visit: Payer: Self-pay | Admitting: Family Medicine

## 2018-01-10 DIAGNOSIS — J439 Emphysema, unspecified: Secondary | ICD-10-CM

## 2018-01-10 DIAGNOSIS — Z716 Tobacco abuse counseling: Secondary | ICD-10-CM

## 2018-01-10 DIAGNOSIS — G500 Trigeminal neuralgia: Secondary | ICD-10-CM

## 2018-01-13 NOTE — Patient Instructions (Signed)
Rituximab injection What is this medicine? RITUXIMAB (ri TUX i mab) is a monoclonal antibody. It is used to treat certain types of cancer like non-Hodgkin lymphoma and chronic lymphocytic leukemia. It is also used to treat rheumatoid arthritis, granulomatosis with polyangiitis (or Wegener's granulomatosis), and microscopic polyangiitis. This medicine may be used for other purposes; ask your health care provider or pharmacist if you have questions. COMMON BRAND NAME(S): Rituxan What should I tell my health care provider before I take this medicine? They need to know if you have any of these conditions: -heart disease -infection (especially a virus infection such as hepatitis B, chickenpox, cold sores, or herpes) -immune system problems -irregular heartbeat -kidney disease -lung or breathing disease, like asthma -recently received or scheduled to receive a vaccine -an unusual or allergic reaction to rituximab, mouse proteins, other medicines, foods, dyes, or preservatives -pregnant or trying to get pregnant -breast-feeding How should I use this medicine? This medicine is for infusion into a vein. It is administered in a hospital or clinic by a specially trained health care professional. A special MedGuide will be given to you by the pharmacist with each prescription and refill. Be sure to read this information carefully each time. Talk to your pediatrician regarding the use of this medicine in children. This medicine is not approved for use in children. Overdosage: If you think you have taken too much of this medicine contact a poison control center or emergency room at once. NOTE: This medicine is only for you. Do not share this medicine with others. What if I miss a dose? It is important not to miss a dose. Call your doctor or health care professional if you are unable to keep an appointment. What may interact with this medicine? -cisplatin -other medicines for arthritis like disease  modifying antirheumatic drugs or tumor necrosis factor inhibitors -live virus vaccines This list may not describe all possible interactions. Give your health care provider a list of all the medicines, herbs, non-prescription drugs, or dietary supplements you use. Also tell them if you smoke, drink alcohol, or use illegal drugs. Some items may interact with your medicine. What should I watch for while using this medicine? Your condition will be monitored carefully while you are receiving this medicine. You may need blood work done while you are taking this medicine. This medicine can cause serious allergic reactions. To reduce your risk you may need to take medicine before treatment with this medicine. Take your medicine as directed. In some patients, this medicine may cause a serious brain infection that may cause death. If you have any problems seeing, thinking, speaking, walking, or standing, tell your doctor right away. If you cannot reach your doctor, urgently seek other source of medical care. Call your doctor or health care professional for advice if you get a fever, chills or sore throat, or other symptoms of a cold or flu. Do not treat yourself. This drug decreases your body's ability to fight infections. Try to avoid being around people who are sick. Do not become pregnant while taking this medicine or for 12 months after stopping it. Women should inform their doctor if they wish to become pregnant or think they might be pregnant. There is a potential for serious side effects to an unborn child. Talk to your health care professional or pharmacist for more information. What side effects may I notice from receiving this medicine? Side effects that you should report to your doctor or health care professional as soon as possible: -breathing   problems -chest pain -dizziness or feeling faint -fast, irregular heartbeat -low blood counts - this medicine may decrease the number of white blood cells,  red blood cells and platelets. You may be at increased risk for infections and bleeding. -mouth sores -redness, blistering, peeling or loosening of the skin, including inside the mouth (this can be added for any serious or exfoliative rash that could lead to hospitalization) -signs of infection - fever or chills, cough, sore throat, pain or difficulty passing urine -signs and symptoms of kidney injury like trouble passing urine or change in the amount of urine -signs and symptoms of liver injury like dark yellow or brown urine; general ill feeling or flu-like symptoms; light-colored stools; loss of appetite; nausea; right upper belly pain; unusually weak or tired; yellowing of the eyes or skin -stomach pain -vomiting Side effects that usually do not require medical attention (report to your doctor or health care professional if they continue or are bothersome): -headache -joint pain -muscle cramps or muscle pain This list may not describe all possible side effects. Call your doctor for medical advice about side effects. You may report side effects to FDA at 1-800-FDA-1088. Where should I keep my medicine? This drug is given in a hospital or clinic and will not be stored at home. NOTE: This sheet is a summary. It may not cover all possible information. If you have questions about this medicine, talk to your doctor, pharmacist, or health care provider.  2018 Elsevier/Gold Standard (2016-04-10 15:28:09) Bendamustine Injection What is this medicine? BENDAMUSTINE (BEN da MUS teen) is a chemotherapy drug. It is used to treat chronic lymphocytic leukemia and non-Hodgkin lymphoma. This medicine may be used for other purposes; ask your health care provider or pharmacist if you have questions. COMMON BRAND NAME(S): BENDEKA, Treanda What should I tell my health care provider before I take this medicine? They need to know if you have any of these conditions: -infection (especially a virus infection such  as chickenpox, cold sores, or herpes) -kidney disease -liver disease -an unusual or allergic reaction to bendamustine, mannitol, other medicines, foods, dyes, or preservatives -pregnant or trying to get pregnant -breast-feeding How should I use this medicine? This medicine is for infusion into a vein. It is given by a health care professional in a hospital or clinic setting. Talk to your pediatrician regarding the use of this medicine in children. Special care may be needed. Overdosage: If you think you have taken too much of this medicine contact a poison control center or emergency room at once. NOTE: This medicine is only for you. Do not share this medicine with others. What if I miss a dose? It is important not to miss your dose. Call your doctor or health care professional if you are unable to keep an appointment. What may interact with this medicine? Do not take this medicine with any of the following medications: -clozapine This medicine may also interact with the following medications: -atazanavir -cimetidine -ciprofloxacin -enoxacin -fluvoxamine -medicines for seizures like carbamazepine and phenobarbital -mexiletine -rifampin -tacrine -thiabendazole -zileuton This list may not describe all possible interactions. Give your health care provider a list of all the medicines, herbs, non-prescription drugs, or dietary supplements you use. Also tell them if you smoke, drink alcohol, or use illegal drugs. Some items may interact with your medicine. What should I watch for while using this medicine? This drug may make you feel generally unwell. This is not uncommon, as chemotherapy can affect healthy cells as well as cancer   cells. Report any side effects. Continue your course of treatment even though you feel ill unless your doctor tells you to stop. You may need blood work done while you are taking this medicine. Call your doctor or health care professional for advice if you get a  fever, chills or sore throat, or other symptoms of a cold or flu. Do not treat yourself. This drug decreases your body's ability to fight infections. Try to avoid being around people who are sick. This medicine may increase your risk to bruise or bleed. Call your doctor or health care professional if you notice any unusual bleeding. Talk to your doctor about your risk of cancer. You may be more at risk for certain types of cancers if you take this medicine. Do not become pregnant while taking this medicine or for 3 months after stopping it. Women should inform their doctor if they wish to become pregnant or think they might be pregnant. Men should not father a child while taking this medicine and for 3 months after stopping it.There is a potential for serious side effects to an unborn child. Talk to your health care professional or pharmacist for more information. Do not breast-feed an infant while taking this medicine. This medicine may interfere with the ability to have a child. You should talk with your doctor or health care professional if you are concerned about your fertility. What side effects may I notice from receiving this medicine? Side effects that you should report to your doctor or health care professional as soon as possible: -allergic reactions like skin rash, itching or hives, swelling of the face, lips, or tongue -low blood counts - this medicine may decrease the number of white blood cells, red blood cells and platelets. You may be at increased risk for infections and bleeding. -redness, blistering, peeling or loosening of the skin, including inside the mouth -signs of infection - fever or chills, cough, sore throat, pain or difficulty passing urine -signs of decreased platelets or bleeding - bruising, pinpoint red spots on the skin, black, tarry stools, blood in the urine -signs of decreased red blood cells - unusually weak or tired, fainting spells, lightheadedness -signs and  symptoms of kidney injury like trouble passing urine or change in the amount of urine -signs and symptoms of liver injury like dark yellow or brown urine; general ill feeling or flu-like symptoms; light-colored stools; loss of appetite; nausea; right upper belly pain; unusually weak or tired; yellowing of the eyes or skin Side effects that usually do not require medical attention (report to your doctor or health care professional if they continue or are bothersome): -constipation -decreased appetite -diarrhea -headache -mouth sores -nausea/vomiting -tiredness This list may not describe all possible side effects. Call your doctor for medical advice about side effects. You may report side effects to FDA at 1-800-FDA-1088. Where should I keep my medicine? This drug is given in a hospital or clinic and will not be stored at home. NOTE: This sheet is a summary. It may not cover all possible information. If you have questions about this medicine, talk to your doctor, pharmacist, or health care provider.  2018 Elsevier/Gold Standard (2015-07-06 08:45:41)  

## 2018-01-14 ENCOUNTER — Inpatient Hospital Stay: Payer: BLUE CROSS/BLUE SHIELD | Attending: Oncology

## 2018-01-14 ENCOUNTER — Encounter: Payer: Self-pay | Admitting: Oncology

## 2018-01-14 ENCOUNTER — Inpatient Hospital Stay (HOSPITAL_BASED_OUTPATIENT_CLINIC_OR_DEPARTMENT_OTHER): Payer: BLUE CROSS/BLUE SHIELD | Admitting: Oncology

## 2018-01-14 VITALS — BP 124/70 | HR 70 | Temp 98.4°F | Resp 16

## 2018-01-14 DIAGNOSIS — R599 Enlarged lymph nodes, unspecified: Secondary | ICD-10-CM

## 2018-01-14 DIAGNOSIS — R062 Wheezing: Secondary | ICD-10-CM | POA: Insufficient documentation

## 2018-01-14 DIAGNOSIS — Z5112 Encounter for antineoplastic immunotherapy: Secondary | ICD-10-CM | POA: Diagnosis not present

## 2018-01-14 DIAGNOSIS — D72829 Elevated white blood cell count, unspecified: Secondary | ICD-10-CM | POA: Diagnosis not present

## 2018-01-14 DIAGNOSIS — F429 Obsessive-compulsive disorder, unspecified: Secondary | ICD-10-CM | POA: Insufficient documentation

## 2018-01-14 DIAGNOSIS — J439 Emphysema, unspecified: Secondary | ICD-10-CM | POA: Insufficient documentation

## 2018-01-14 DIAGNOSIS — J449 Chronic obstructive pulmonary disease, unspecified: Secondary | ICD-10-CM | POA: Diagnosis not present

## 2018-01-14 DIAGNOSIS — R05 Cough: Secondary | ICD-10-CM | POA: Diagnosis not present

## 2018-01-14 DIAGNOSIS — C9110 Chronic lymphocytic leukemia of B-cell type not having achieved remission: Secondary | ICD-10-CM | POA: Insufficient documentation

## 2018-01-14 DIAGNOSIS — M545 Low back pain: Secondary | ICD-10-CM | POA: Insufficient documentation

## 2018-01-14 DIAGNOSIS — Z720 Tobacco use: Secondary | ICD-10-CM | POA: Diagnosis not present

## 2018-01-14 DIAGNOSIS — F329 Major depressive disorder, single episode, unspecified: Secondary | ICD-10-CM | POA: Insufficient documentation

## 2018-01-14 DIAGNOSIS — N63 Unspecified lump in unspecified breast: Secondary | ICD-10-CM | POA: Insufficient documentation

## 2018-01-14 DIAGNOSIS — Z5111 Encounter for antineoplastic chemotherapy: Secondary | ICD-10-CM | POA: Insufficient documentation

## 2018-01-14 DIAGNOSIS — J40 Bronchitis, not specified as acute or chronic: Secondary | ICD-10-CM | POA: Diagnosis not present

## 2018-01-14 DIAGNOSIS — J040 Acute laryngitis: Secondary | ICD-10-CM | POA: Diagnosis not present

## 2018-01-14 NOTE — Progress Notes (Signed)
[facility]   Subjective:  Patient ID: Monique [name], female    DOB: May 19, 1971  Age: 47 y.o. MRN: [id number]  CC:  Chief Complaint  Patient presents with  . Chemo Care Visit    HPI Monique [name] presents for chemo care visit, prior to the initiation of Rituxan and Treanda.  This is scheduled to begin on Jan 21, 2018.  Patient will be attending chemo education class today beginning at 9am.   She was last seen by primary medical oncologist [name] on 01/08/2018 for discussion of pathology results of recent breast biopsy and treatment planning.  She was recently evaluated in symptom management for a palpable breast mass and underwent mammogram/ultrasound and biopsy revealing progressive CLL.  Patient previously under close observation for CLL and not on current treatment. Previously treated with 4 cycles of weekly Rituxan completing on June 2018. Patient was to have CT scan of neck, chest, abdomen and pelvis for baseline imaging prior to initiating Rituxin and Treanda.    Addendum: CT abdomen/pelvis/chest from 01/15/18 showed progressive bulky adenopathy compatible with mild worsening of disease.  CT soft tissue neck showed stable to mild progression of CLL with diffuse cervical and upper thoracic  lymphadenopathy.  At that visit she was anxious but otherwise felt well.   History Patient Active Problem List   Diagnosis Date Noted  . Breast mass, right 01/07/2018  . Thoracic aortic atherosclerosis ([place]) 08/20/2017  . Emphysema of lung ([place]) 08/20/2017  . Low back pain 07/02/2017  . Adductor tendinitis 04/23/2017  . Trochanteric bursitis of right hip 04/23/2017  . GERD without esophagitis 12/11/2016  . CLL (chronic lymphocytic leukemia) ([place]) 11/24/2016  . Pap smear abnormality of cervix/human papillomavirus (HPV) positive 09/12/2016  . Stress incontinence 09/04/2016  . B12 deficiency  07/10/2015  . Insomnia, persistent 07/10/2015  . Mild episode of recurrent major depressive disorder ([place]) 07/10/2015  . Anorexia nervosa, restricting type 07/10/2015  . Anxiety, generalized 07/10/2015  . H/O suicide attempt 07/10/2015  . Lymphocytosis 07/10/2015  . Obsessive-compulsive disorder 07/10/2015  . Tobacco use 07/10/2015  . History of cervical dysplasia 07/18/2014   Past Medical History:  Diagnosis Date  . Anxiety   . Cervical cancer ([place])    hx LEEP over 18 years ago.   . Cervical intraepithelial neoplasia I   . Chronic lymphocytic leukemia ([place])   . Depression   . Eating disorder   . History of self-harm   . Insomnia   . Obsession   . Pap smear abnormality of cervix with LGSIL   . Tobacco abuse   . Vitamin B12 deficiency (non anemic)    Past Surgical History:  Procedure Laterality Date  . BREAST BIOPSY Right 01/05/2018   US guided biopsy of 2 areas and 1 lymph node  . CERVICAL BIOPSY  W/ LOOP ELECTRODE EXCISION    . TUBAL LIGATION     No Known Allergies Prior to Admission medications   Medication Sig Start Date End Date Taking? Authorizing Provider  carbamazepine (TEGRETOL) 200 MG tablet TAKE 1 TABLET (200 MG TOTAL) BY MOUTH 3 (THREE) TIMES DAILY. 01/10/18   [name], MD  CHANTIX CONTINUING MONTH PAK 1 MG tablet TAKE 1 TABLET (1 MG  TOTAL) BY MOUTH 2 (TWO) TIMES DAILY. FILL STARTING KIT FIRST 01/10/18   [name], MD  doxycycline (VIBRA-TABS) 100 MG tablet Take 1 tablet (100 mg total) by mouth 2 (two) times daily. 01/09/18   Jacquelin Hawking, NP  escitalopram (LEXAPRO) 10 MG tablet  12/09/17   [provider]  gabapentin (NEURONTIN) 100 MG capsule Take 100 mg twice a day for one week, then increase to 200 mg(2 tablets) twice a day and continue 12/09/17   [provider]  traMADol (ULTRAM) 50 MG tablet Take by mouth every 6 (six) hours as needed.    Carlynn Spry, PA-C  umeclidinium-vilanterol [facility] ELLIPTA) 62.5-25 MCG/INH AEPB TAKE  1 PUFF BY MOUTH EVERY DAY 10/02/17   [name], MD   Social History   Socioeconomic History  . Marital status: Divorced    Spouse name: Not on file  . Number of children: Not on file  . Years of education: Not on file  . Highest education level: Not on file  Occupational History  . Not on file  Social Needs  . Financial resource strain: Not on file  . Food insecurity:    Worry: Not on file    Inability: Not on file  . Transportation needs:    Medical: Not on file    Non-medical: Not on file  Tobacco Use  . Smoking status: Current Every Day Smoker    Packs/day: 1.00    Years: 31.00    Pack years: 31.00    Start date: 09/25/1986  . Smokeless tobacco: Never Used  . Tobacco comment: Would like to discuss Chantix- she forgot to take this medication  Substance and Sexual Activity  . Alcohol use: Yes    Alcohol/week: 0.0 oz    Comment: rarely  . Drug use: No  . Sexual activity: Yes    Partners: Male    Birth control/protection: None  Lifestyle  . Physical activity:    Days per week: Not on file    Minutes per session: Not on file  . Stress: Not on file  Relationships  . Social connections:    Talks on phone: Not on file    Gets together: Not on file    Attends religious service: Not on file    Active member of club or organization: Not on file    Attends meetings of clubs or organizations: Not on file    Relationship status: Not on file  . Intimate partner violence:    Fear of current or ex partner: Not on file    Emotionally abused: Not on file    Physically abused: Not on file    Forced sexual activity: Not on file  Other Topics Concern  . Not on file  Social History Narrative  . Not on file    Review of Systems  Constitutional: Negative.  Negative for appetite change, chills, fatigue and fever.  HENT: Negative.  Negative for congestion, mouth sores, nosebleeds, sinus pain and sore throat.   Eyes: Negative.   Respiratory: Negative.  Negative for cough,  shortness of breath and wheezing.   Cardiovascular: Negative.  Negative for chest pain and leg swelling.  Gastrointestinal: Negative.  Negative for abdominal distention, abdominal pain, blood in stool, constipation, diarrhea, nausea and vomiting.  Endocrine: Negative.   Genitourinary: Negative.  Negative for dysuria, flank pain, frequency, hematuria and urgency.  Musculoskeletal: Negative.  Negative for arthralgias and back pain.  Skin: Negative.  Negative for pallor.  Allergic/Immunologic: Negative.   Neurological:  Negative.  Negative for dizziness, weakness and headaches.  Hematological: Negative.  Negative for adenopathy.  Psychiatric/Behavioral: Negative for confusion. The patient is nervous/anxious.     Objective:  BP 124/70 (BP Location: Left Arm, Patient Position: Sitting)   Pulse 70   Temp 98.4 F (36.9 C) (Tympanic)   Resp 16   LMP 12/23/2017   SpO2 100%   Physical Exam  Constitutional: She is oriented to person, place, and time. Vital signs are normal.  HENT:  Head: Normocephalic and atraumatic.  Eyes: Pupils are equal, round, and reactive to light.  Neck: Normal range of motion.  Cardiovascular: Normal rate, regular rhythm and normal heart sounds.  No murmur heard. Pulmonary/Chest: Effort normal and breath sounds normal. She has no wheezes.  Abdominal: Soft. Normal appearance and bowel sounds are normal. She exhibits no distension. There is no tenderness.  Musculoskeletal: Normal range of motion. She exhibits no edema.  Neurological: She is alert and oriented to person, place, and time.  Skin: Skin is warm and dry. No rash noted.  Psychiatric: Judgment normal.    Assessment & Plan:  Caleen Taaffe is a 47 y.o. female who presents for chemo care visit.  Reviewed patient's current medication list, and there are no concerns at this time.  She has a current/ up-to-date PCP Dr. Ancil Boozer.  She also sees Dr. Melrose Nakayama with [facility] in neurology. She has scheduled  follow-up with [name] her primary medical oncologist next week.  Active Problems:  Emphysema/COPD: Continues to smoke.  Counseled on cessation.  Patient states she is trying to cut back and only smoking 5 cigarettes per day.  Has attempted Chantix in the past without success.  Has received an additional starter kit from PCP to try to quit smoking.  Continues Anoro for shortness of breath, occasional cough and wheezing.   Low back pain: Currently controlled with the use of tramadol.   Depression/OCD/ Previous suicide attempt: Restarted Lexapro 10 mg daily on December 09, 2017. Currently doing well.  Patient has much anxiety related to her health.  Spoke to her at length about new treatment to help decrease anxiety.  Introduced symptom management clinic to patient if she should have episodes with or about increased anxiety during treatments.  She currently does not have suicidal ideations.  Trigeminal neuralgia: Diagnosed in December 2018.  Continue carbamazepine PRN.  Follow-up with neurology PRN.   Recently evaluated for facial pain by Dr. Melrose Nakayama.  Started on gabapentin 100 mg daily for 3 days then increase to 200 mg twice daily for nerve pain.  Has had to dose reduce for drowsiness.  Had a brain MRI which revealed no acute intracranial abnormality.  Aortic atherosclerosis/Chest pain: Stable.  Not currently on medications.  Recent stable EKG with PCP.   CLL: Will begin treatment with Rituxan and Treanda next week.   Discussed at length adverse reactions of both Rituxan and Treanda.  Most common adverse reactions include hypertension, flushing, peripheral edema, fatigue, chills, neuropathy, insomnia, nausea, diarrhea, abdominal pain, urinary tract infection and cytopenias.  If patient experiences worsening of any of her chronic comorbid conditions such as worsening nerve pain or insomnia, she is encouraged to reach out to symptom management.  Information provided to patient including triage RN  telephone number.   Greater than 50% was spent in counseling and coordination of care with this patient including but not limited to discussion of the relevant topics above (See A&P) including, but not limited to diagnosis and management of acute and  chronic medical conditions.     CLL (chronic lymphocytic leukemia) ([place])   Faythe Casa, NP [facility] 01/15/2018 11:15 AM

## 2018-01-15 ENCOUNTER — Ambulatory Visit
Admission: RE | Admit: 2018-01-15 | Discharge: 2018-01-15 | Disposition: A | Discharge status: Home / Self Care | Payer: BLUE CROSS/BLUE SHIELD | Source: Ambulatory Visit | Attending: Oncology | Admitting: Oncology

## 2018-01-15 DIAGNOSIS — I774 Celiac artery compression syndrome: Secondary | ICD-10-CM | POA: Diagnosis not present

## 2018-01-15 DIAGNOSIS — C9110 Chronic lymphocytic leukemia of B-cell type not having achieved remission: Secondary | ICD-10-CM

## 2018-01-15 DIAGNOSIS — J432 Centrilobular emphysema: Secondary | ICD-10-CM | POA: Insufficient documentation

## 2018-01-15 DIAGNOSIS — R591 Generalized enlarged lymph nodes: Secondary | ICD-10-CM | POA: Diagnosis not present

## 2018-01-15 DIAGNOSIS — I700 Atherosclerosis of aorta: Secondary | ICD-10-CM | POA: Diagnosis not present

## 2018-01-15 MED ORDER — IOPAMIDOL (ISOVUE-300) INJECTION 61%
100.0000 mL | Freq: Once | INTRAVENOUS | Status: AC | PRN
  Administered 2018-01-15: 100 mL via INTRAVENOUS

## 2018-01-15 NOTE — Patient Instructions (Signed)
Rituximab injection What is this medicine? RITUXIMAB (ri TUX i mab) is a monoclonal antibody. It is used to treat certain types of cancer like non-Hodgkin lymphoma and chronic lymphocytic leukemia. It is also used to treat rheumatoid arthritis, granulomatosis with polyangiitis (or Wegener's granulomatosis), and microscopic polyangiitis. This medicine may be used for other purposes; ask your health care provider or pharmacist if you have questions. COMMON BRAND NAME(S): Rituxan What should I tell my health care provider before I take this medicine? They need to know if you have any of these conditions: -heart disease -infection (especially a virus infection such as hepatitis B, chickenpox, cold sores, or herpes) -immune system problems -irregular heartbeat -kidney disease -lung or breathing disease, like asthma -recently received or scheduled to receive a vaccine -an unusual or allergic reaction to rituximab, mouse proteins, other medicines, foods, dyes, or preservatives -pregnant or trying to get pregnant -breast-feeding How should I use this medicine? This medicine is for infusion into a vein. It is administered in a hospital or clinic by a specially trained health care professional. A special MedGuide will be given to you by the pharmacist with each prescription and refill. Be sure to read this information carefully each time. Talk to your pediatrician regarding the use of this medicine in children. This medicine is not approved for use in children. Overdosage: If you think you have taken too much of this medicine contact a poison control center or emergency room at once. NOTE: This medicine is only for you. Do not share this medicine with others. What if I miss a dose? It is important not to miss a dose. Call your doctor or health care professional if you are unable to keep an appointment. What may interact with this medicine? -cisplatin -other medicines for arthritis like disease  modifying antirheumatic drugs or tumor necrosis factor inhibitors -live virus vaccines This list may not describe all possible interactions. Give your health care provider a list of all the medicines, herbs, non-prescription drugs, or dietary supplements you use. Also tell them if you smoke, drink alcohol, or use illegal drugs. Some items may interact with your medicine. What should I watch for while using this medicine? Your condition will be monitored carefully while you are receiving this medicine. You may need blood work done while you are taking this medicine. This medicine can cause serious allergic reactions. To reduce your risk you may need to take medicine before treatment with this medicine. Take your medicine as directed. In some patients, this medicine may cause a serious brain infection that may cause death. If you have any problems seeing, thinking, speaking, walking, or standing, tell your doctor right away. If you cannot reach your doctor, urgently seek other source of medical care. Call your doctor or health care professional for advice if you get a fever, chills or sore throat, or other symptoms of a cold or flu. Do not treat yourself. This drug decreases your body's ability to fight infections. Try to avoid being around people who are sick. Do not become pregnant while taking this medicine or for 12 months after stopping it. Women should inform their doctor if they wish to become pregnant or think they might be pregnant. There is a potential for serious side effects to an unborn child. Talk to your health care professional or pharmacist for more information. What side effects may I notice from receiving this medicine? Side effects that you should report to your doctor or health care professional as soon as possible: -breathing   problems -chest pain -dizziness or feeling faint -fast, irregular heartbeat -low blood counts - this medicine may decrease the number of white blood cells,  red blood cells and platelets. You may be at increased risk for infections and bleeding. -mouth sores -redness, blistering, peeling or loosening of the skin, including inside the mouth (this can be added for any serious or exfoliative rash that could lead to hospitalization) -signs of infection - fever or chills, cough, sore throat, pain or difficulty passing urine -signs and symptoms of kidney injury like trouble passing urine or change in the amount of urine -signs and symptoms of liver injury like dark yellow or brown urine; general ill feeling or flu-like symptoms; light-colored stools; loss of appetite; nausea; right upper belly pain; unusually weak or tired; yellowing of the eyes or skin -stomach pain -vomiting Side effects that usually do not require medical attention (report to your doctor or health care professional if they continue or are bothersome): -headache -joint pain -muscle cramps or muscle pain This list may not describe all possible side effects. Call your doctor for medical advice about side effects. You may report side effects to FDA at 1-800-FDA-1088. Where should I keep my medicine? This drug is given in a hospital or clinic and will not be stored at home. NOTE: This sheet is a summary. It may not cover all possible information. If you have questions about this medicine, talk to your doctor, pharmacist, or health care provider.  2018 Elsevier/Gold Standard (2016-04-10 15:28:09)  Chronic Lymphocytic Leukemia Chronic lymphocytic leukemia (CLL) is a type of cancer of the blood cells and soft tissue inside bones (bone marrow). CLL happens when your bone marrow makes too many abnormal white blood cells. The cells, called leukemia cells, do not function normally and accumulate in the blood. Eventually they crowd out other healthy blood cells. CLL usually gets worse slowly. It can cause complications in your organs, such as in your spleen. It can also weaken your immune system  and lead to conditions in which your immune system attacks your body (autoimmune conditions). What are the causes? The cause of this condition is not known. What increases the risk? You are more likely to develop this condition if:  You are older than 50 years.  You are white.  You are female.  You have a family history of CLL or other cancers of the lymph system.  You are of [place] or [place] descent.  You have been exposed to certain chemicals, such as: ? Insecticides. ? Herbicides. These include Agent Orange, a herbicide used in the [place] war.  What are the signs or symptoms? At first, there may be no symptoms. After a while, symptoms may include:  Feeling more tired than usual, even after rest.  Unplanned weight loss.  Heavy sweating at night.  Fever.  Shortness of breath.  Paleness.  Painless, swollen lymph nodes.  A feeling of fullness in the upper left part of the abdomen.  Easy bruising or bleeding.  Frequent infections.  How is this diagnosed? This condition is diagnosed based on:  A physical exam to check for an enlarged spleen, liver, or lymph nodes.  Blood and bone marrow tests to check for leukemia cells. Tests may include: ? A complete blood count. ? Flow cytometry. This method uses light sensors and dyes to figure out the number of cells as well as their size, structure, and general health. ? Immunophenotyping. This method is used to diagnose leukemia by identifying specific  antibodies found in white blood cells. The test is used when a complete blood count shows the presence of immature cells or a high number of white blood cells. ? Fluorescence in situ hybridization (FISH). This test is used to examine defects in chromosomes and how those defects affect the functioning of the cell. Results from a [place] test will be used to determine treatment and assess the outcome of that treatment.  A CT scan to check for swelling or  anything abnormal in your spleen, liver, and lymph nodes.  How is this treated? Treatment for this condition depends on the stage of the leukemia and whether you have symptoms. Treatment may include:  Observation.  Targeted drugs. These are medicines that interfere with the way leukemia cells grow and multiply. They identify and attack specific leukemia cells without harming normal cells.  Chemotherapy drugs. These are medicines that kill leukemia cells that are multiplying quickly.  Radiation.  Surgery to remove the spleen.  Biological therapy (immunotherapy). This treatment boosts the ability of your immune system to fight the leukemia cells.  Bone marrow or peripheral blood stem cell transplant. This treatment replaces your own bone marrow or stem cells with bone marrow or stem cells from a donor. This treatment may be done after you receive very high doses of chemotherapy or radiation that kill your stem cells and bone marrow.  New treatments through clinical trials.  Additional medicines may be needed to help manage symptoms. Follow these instructions at home: Medicines  Take over-the-counter and prescription medicines only as told by your health care provider.  If you were prescribed an antibiotic medicine, take it as told by your health care provider. Do not stop taking the antibiotic even if you start to feel better. If you are on chemotherapy:  Wash your hands often, especially before meals, after being outside, and after using the toilet. Have visitors do the same.  Keep your teeth and gums clean and well cared for. Use soft toothbrushes.  Protect your skin from the sun by using sunscreen and wearing protective clothing. General instructions  Avoid contact sports or other rough activities. Ask your health care provider what activities are safe for you.  Avoid crowded places and people who are sick.  Tell your cancer care team if you develop side effects. They may be  able to recommend ways to relieve them.  Try to eat regular, healthy meals. Some of your treatments might affect your appetite.  Find healthy ways of coping with stress, such as by doing yoga or meditation or by joining a support group.  Keep all follow-up visits as told by your health care provider. This is important. Where to find more information:  American Cancer Society: www.cancer.org  Leukemia and Lymphoma Society: PreviewPal.pl  National Cancer Institute ([place]): www.cancer.gov Contact a health care provider if:  You have pain in your abdomen.  You develop new bruises that are getting bigger.  You have painful or more swollen lymph nodes.  You develop bleeding from your gums or nose.  You cannot eat or drink without vomiting.  You feel lightheaded. Get help right away if:  You have a fever or chills.  You develop chest pain.  You have trouble breathing or feel short of breath.  You faint.  There is blood in your urine or stool.  You have excessive bleeding.  You have any symptoms that are severe or uncontrolled. Summary  Chronic lymphocytic leukemia (CLL) is a type of cancer of the blood  cells and bone marrow.  This condition can cause an enlarged spleen, swollen lymph nodes, a weakened immune system, low red blood cell and platelets counts, and autoimmune conditions.  Treatment for this condition depends on the stage of the cancer and whether you have symptoms.  Chemotherapy, radiation, surgery to remove the spleen, and bone marrow transplant are some of the ways to treat CLL. This information is not intended to replace advice given to you by your health care provider. Make sure you discuss any questions you have with your health care provider. Document Released: 01/19/2009 Document Revised: 08/14/2016 Document Reviewed: 08/14/2016 Elsevier Interactive Patient Education  2018 [employer]  Chronic Lymphocytic Leukemia Chronic lymphocytic leukemia (CLL)  is a type of cancer of the blood cells and soft tissue inside bones (bone marrow). CLL happens when your bone marrow makes too many abnormal white blood cells. The cells, called leukemia cells, do not function normally and accumulate in the blood. Eventually they crowd out other healthy blood cells. CLL usually gets worse slowly. It can cause complications in your organs, such as in your spleen. It can also weaken your immune system and lead to conditions in which your immune system attacks your body (autoimmune conditions). What are the causes? The cause of this condition is not known. What increases the risk? You are more likely to develop this condition if:  You are older than 50 years.  You are white.  You are female.  You have a family history of CLL or other cancers of the lymph system.  You are of [place] or [place] descent.  You have been exposed to certain chemicals, such as: ? Insecticides. ? Herbicides. These include Agent Orange, a herbicide used in the [place] war.  What are the signs or symptoms? At first, there may be no symptoms. After a while, symptoms may include:  Feeling more tired than usual, even after rest.  Unplanned weight loss.  Heavy sweating at night.  Fever.  Shortness of breath.  Paleness.  Painless, swollen lymph nodes.  A feeling of fullness in the upper left part of the abdomen.  Easy bruising or bleeding.  Frequent infections.  How is this diagnosed? This condition is diagnosed based on:  A physical exam to check for an enlarged spleen, liver, or lymph nodes.  Blood and bone marrow tests to check for leukemia cells. Tests may include: ? A complete blood count. ? Flow cytometry. This method uses light sensors and dyes to figure out the number of cells as well as their size, structure, and general health. ? Immunophenotyping. This method is used to diagnose leukemia by identifying specific antibodies found in  white blood cells. The test is used when a complete blood count shows the presence of immature cells or a high number of white blood cells. ? Fluorescence in situ hybridization (FISH). This test is used to examine defects in chromosomes and how those defects affect the functioning of the cell. Results from a [place] test will be used to determine treatment and assess the outcome of that treatment.  A CT scan to check for swelling or anything abnormal in your spleen, liver, and lymph nodes.  How is this treated? Treatment for this condition depends on the stage of the leukemia and whether you have symptoms. Treatment may include:  Observation.  Targeted drugs. These are medicines that interfere with the way leukemia cells grow and multiply. They identify and attack specific leukemia cells without harming normal cells.  Chemotherapy drugs. These are medicines that kill leukemia cells that are multiplying quickly.  Radiation.  Surgery to remove the spleen.  Biological therapy (immunotherapy). This treatment boosts the ability of your immune system to fight the leukemia cells.  Bone marrow or peripheral blood stem cell transplant. This treatment replaces your own bone marrow or stem cells with bone marrow or stem cells from a donor. This treatment may be done after you receive very high doses of chemotherapy or radiation that kill your stem cells and bone marrow.  New treatments through clinical trials.  Additional medicines may be needed to help manage symptoms. Follow these instructions at home: Medicines  Take over-the-counter and prescription medicines only as told by your health care provider.  If you were prescribed an antibiotic medicine, take it as told by your health care provider. Do not stop taking the antibiotic even if you start to feel better. If you are on chemotherapy:  Wash your hands often, especially before meals, after being outside, and after using the toilet. Have  visitors do the same.  Keep your teeth and gums clean and well cared for. Use soft toothbrushes.  Protect your skin from the sun by using sunscreen and wearing protective clothing. General instructions  Avoid contact sports or other rough activities. Ask your health care provider what activities are safe for you.  Avoid crowded places and people who are sick.  Tell your cancer care team if you develop side effects. They may be able to recommend ways to relieve them.  Try to eat regular, healthy meals. Some of your treatments might affect your appetite.  Find healthy ways of coping with stress, such as by doing yoga or meditation or by joining a support group.  Keep all follow-up visits as told by your health care provider. This is important. Where to find more information:  American Cancer Society: www.cancer.org  Leukemia and Lymphoma Society: PreviewPal.pl  National Cancer Institute ([place]): www.cancer.gov Contact a health care provider if:  You have pain in your abdomen.  You develop new bruises that are getting bigger.  You have painful or more swollen lymph nodes.  You develop bleeding from your gums or nose.  You cannot eat or drink without vomiting.  You feel lightheaded. Get help right away if:  You have a fever or chills.  You develop chest pain.  You have trouble breathing or feel short of breath.  You faint.  There is blood in your urine or stool.  You have excessive bleeding.  You have any symptoms that are severe or uncontrolled. Summary  Chronic lymphocytic leukemia (CLL) is a type of cancer of the blood cells and bone marrow.  This condition can cause an enlarged spleen, swollen lymph nodes, a weakened immune system, low red blood cell and platelets counts, and autoimmune conditions.  Treatment for this condition depends on the stage of the cancer and whether you have symptoms.  Chemotherapy, radiation, surgery to remove the spleen, and bone  marrow transplant are some of the ways to treat CLL. This information is not intended to replace advice given to you by your health care provider. Make sure you discuss any questions you have with your health care provider. Document Released: 01/19/2009 Document Revised: 08/14/2016 Document Reviewed: 08/14/2016 Elsevier Interactive Patient Education  [employer].

## 2018-01-19 NOTE — Progress Notes (Addendum)
[place]  [phone] [phone]  ID: Monique [name] OB: 10-Apr-1971  MR#: [id number]  ZLD#:[id number]  Patient Care Team: [name], MD as PCP - General (Family Medicine)  CHIEF COMPLAINT: CLL, breast mass.  INTERVAL HISTORY: Patient returns to clinic today for further evaluation and initiation of cycle 1, day 1 of Rituxan and Treanda.  She is anxious, but otherwise feels well.  She has no neurologic complaints. She denies any fevers, illnesses, or weight loss. She has no chest pain or shortness of breath. She denies any nausea, vomiting, constipation, or diarrhea. She has no urinary complaints.  Patient offers no further specific complaints today.  REVIEW OF SYSTEMS:   Review of Systems  Constitutional: Negative.  Negative for fever, malaise/fatigue and weight loss.  HENT: Negative.  Negative for congestion.   Respiratory: Negative for cough and shortness of breath.   Cardiovascular: Negative.  Negative for chest pain and leg swelling.  Gastrointestinal: Negative.  Negative for abdominal pain and constipation.  Genitourinary: Negative.  Negative for dysuria.  Musculoskeletal: Positive for neck pain. Negative for myalgias.  Skin: Negative.  Negative for rash.  Neurological: Negative.  Negative for sensory change, focal weakness and weakness.  Psychiatric/Behavioral: The patient is nervous/anxious.     As per HPI. Otherwise, a complete review of systems is negative.  PAST MEDICAL HISTORY: Past Medical History:  Diagnosis Date  . Anxiety   . Cervical cancer ([place])    hx LEEP over 18 years ago.   . Cervical intraepithelial neoplasia I   . Chronic lymphocytic leukemia ([place])   . Depression   . Eating disorder   . History of self-harm   . Insomnia   . Obsession   . Pap smear abnormality of cervix with LGSIL   . Tobacco abuse   . Vitamin B12 deficiency (non anemic)     PAST SURGICAL HISTORY: Past Surgical History:  Procedure  Laterality Date  . BREAST BIOPSY Right 01/05/2018   US guided biopsy of 2 areas and 1 lymph node  . CERVICAL BIOPSY  W/ LOOP ELECTRODE EXCISION    . TUBAL LIGATION      FAMILY HISTORY: Family History  Problem Relation Age of Onset  . Cancer Mother        thyroid  . Alcohol abuse Father   . Depression Sister   . Cancer Sister        cevical  . Alcohol abuse Brother   . Depression Brother   . Bipolar disorder Brother   . ADD / ADHD Son   . Breast cancer Neg Hx     ADVANCED DIRECTIVES (Y/N):  N  HEALTH MAINTENANCE: Social History   Tobacco Use  . Smoking status: Current Every Day Smoker    Packs/day: 1.00    Years: 31.00    Pack years: 31.00    Start date: 09/25/1986  . Smokeless tobacco: Never Used  . Tobacco comment: Would like to discuss Chantix- she forgot to take this medication  Substance Use Topics  . Alcohol use: Yes    Alcohol/week: 0.0 oz    Comment: rarely  . Drug use: No     Colonoscopy:  PAP:  Bone density:  Lipid panel:  No Known Allergies  Current Outpatient Medications  Medication Sig Dispense Refill  . carbamazepine (TEGRETOL) 200 MG tablet TAKE 1 TABLET (200 MG TOTAL) BY MOUTH 3 (THREE) TIMES DAILY. 90 tablet 0  . escitalopram (LEXAPRO) 10 MG tablet     . gabapentin (  NEURONTIN) 100 MG capsule Take 100 mg twice a day for one week, then increase to 200 mg(2 tablets) twice a day and continue    . umeclidinium-vilanterol (ANORO ELLIPTA) 62.5-25 MCG/INH AEPB TAKE 1 PUFF BY MOUTH EVERY DAY 60 each 5  . ondansetron (ZOFRAN) 8 MG tablet Take 1 tablet (8 mg total) by mouth 2 (two) times daily as needed for refractory nausea / vomiting. (Patient not taking: Reported on 01/21/2018) 30 tablet 2  . prochlorperazine (COMPAZINE) 10 MG tablet Take 1 tablet (10 mg total) by mouth every 6 (six) hours as needed (Nausea or vomiting). (Patient not taking: Reported on 01/21/2018) 60 tablet 2  . traMADol (ULTRAM) 50 MG tablet Take by mouth every 6 (six) hours as needed.      No current facility-administered medications for this visit.    Facility-Administered Medications Ordered in Other Visits  Medication Dose Route Frequency Provider Last Rate Last Dose  . 0.9 %  sodium chloride infusion   Intravenous Once Lloyd Huger, MD      . acetaminophen (TYLENOL) tablet 650 mg  650 mg Oral Once Lloyd Huger, MD      . bendamustine [facility]) 150 mg in sodium chloride 0.9 % 50 mL (2.6786 mg/mL) chemo infusion  90 mg/m2 (Treatment Plan Recorded) Intravenous Once Lloyd Huger, MD      . dexamethasone (DECADRON) injection 10 mg  10 mg Intravenous Once Lloyd Huger, MD      . diphenhydrAMINE (BENADRYL) capsule 25 mg  25 mg Oral Once Lloyd Huger, MD      . palonosetron (ALOXI) injection 0.25 mg  0.25 mg Intravenous Once Lloyd Huger, MD      . riTUXimab (RITUXAN) 700 mg in sodium chloride 0.9 % 250 mL (2.1875 mg/mL) infusion  375 mg/m2 (Treatment Plan Recorded) Intravenous Once Lloyd Huger, MD        OBJECTIVE: Vitals:   01/21/18 0826  BP: 98/64  Pulse: 72  Resp: 18  Temp: (!) 95.9 F (35.5 C)     Body mass index is 24.06 kg/m.    ECOG FS:0 - Asymptomatic  General: Well-developed, well-nourished, no acute distress. Eyes: Pink conjunctiva, anicteric sclera. HEENT: Normocephalic, moist mucous membranes, clear oropharnyx. Lungs: Clear to auscultation bilaterally. Heart: Regular rate and rhythm. No rubs, murmurs, or gallops. Abdomen: Soft, nontender, nondistended. No organomegaly noted, normoactive bowel sounds. Musculoskeletal: No edema, cyanosis, or clubbing. Neuro: Alert, answering all questions appropriately. Cranial nerves grossly intact. Skin: No rashes or petechiae noted. Psych: Normal affect. Lymphatics: Palpable cervical and axillary adenopathy.  LAB RESULTS:  Lab Results  Component Value Date   NA 138 01/21/2018   K 3.9 01/21/2018   CL 102 01/21/2018   CO2 27 01/21/2018   GLUCOSE 104 (H) 01/21/2018    BUN 18 01/21/2018   CREATININE 0.93 01/21/2018   CALCIUM 8.6 (L) 01/21/2018   PROT 6.1 (L) 01/21/2018   ALBUMIN 3.6 01/21/2018   AST 26 01/21/2018   ALT 18 01/21/2018   ALKPHOS 63 01/21/2018   BILITOT 0.6 01/21/2018   GFRNONAA >60 01/21/2018   GFRAA >60 01/21/2018    Lab Results  Component Value Date   WBC 34.5 (H) 01/21/2018   NEUTROABS 2.6 01/21/2018   HGB 12.9 01/21/2018   HCT 38.2 01/21/2018   MCV 98.3 01/21/2018   PLT 168 01/21/2018     STUDIES: Ct Soft Tissue Neck W Contrast  Result Date: 01/15/2018 CLINICAL DATA:  46 year old female with chronic lymphocytic leukemia. Restaging.  EXAM: CT NECK WITH CONTRAST TECHNIQUE: Multidetector CT imaging of the neck was performed using the standard protocol following the bolus administration of intravenous contrast. CONTRAST:  121m ISOVUE-300 IOPAMIDOL (ISOVUE-300) INJECTION 61% in conjunction with contrast enhanced imaging of the chest, abdomen, and pelvis reported separately. COMPARISON:  Brain MRI 01/01/2018.  Neck CT 10/01/2017 and earlier. FINDINGS: Pharynx and larynx: Glottis is closed as before. Laryngeal and pharyngeal soft tissue contours remain normal. Negative parapharyngeal and retropharyngeal spaces. Salivary glands: The sublingual space, submandibular glands, and parotid glands are stable and within normal limits. Thyroid: Stable and normal. Lymph nodes: Generalized cervical and upper thoracic lymphadenopathy. The largest nodes in the neck bilaterally are generally 1 millimeter increased compared to the January CT. For example, the largest left level 2 B lymph node on series 7, image 42 measures 11 millimeter short axis and was 8-9 millimeters previously. None of the nodal stations demonstrate decreased lymph node size or number. No cystic or necrotic nodes. Vascular: Major vascular structures in the neck and at the skull base remain patent. Limited intracranial: Negative. Visualized orbits: Negative. Mastoids and visualized  paranasal sinuses: Clear. Skeleton: Negative. Upper chest: Reported separately today. IMPRESSION: 1. Stable to mild progression of CLL since January. Diffuse cervical and upper thoracic lymphadenopathy with many of the largest nodes increased in size by about 1 mm since 10/01/2017. 2.  CT Chest, Abdomen, and Pelvis today are reported separately. Electronically Signed   By: HGenevie AnnM.D.   On: 01/15/2018 13:41   Ct Chest W Contrast  Result Date: 01/15/2018 CLINICAL DATA:  Follow up of chronic lymphocytic leukemia. Restaging. EXAM: CT CHEST, ABDOMEN, AND PELVIS WITH CONTRAST TECHNIQUE: Multidetector CT imaging of the chest, abdomen and pelvis was performed following the standard protocol during bolus administration of intravenous contrast. CONTRAST:  1093mISOVUE-300 IOPAMIDOL (ISOVUE-300) INJECTION 61% COMPARISON:  CT scan 10/01/2017 FINDINGS: CT CHEST FINDINGS Cardiovascular: Unremarkable Mediastinum/Nodes: Notable supraclavicular, axillary, subpectoral, paratracheal, bilateral hilar, right infrahilar, and subcarinal adenopathy. An index left axillary lymph node measures 2.0 cm in short axis on image 16/3, previously 1.7 cm. An index subpectoral lymph node measures 2.1 cm in short axis on image 10/3, previously 1.6 cm. Index right paratracheal node 1.4 cm in short axis on image 18/3, previously 1.1 cm. Subcarinal node 1.7 cm in short axis on image 25/3, formerly the same. Lungs/Pleura: Centrilobular emphysema. 5 by 4 mm right upper lobe subpleural nodule on image 36/5, stable compared to earliest available comparison of 01/21/2017. Mild subpleural nodularity along the minor fissure including a 0.6 by 0.5 cm nodule on image 74/5, stable. Additional small nodules in the right middle lobe and left lower lobe are stable compared to 01/21/2017. Musculoskeletal: Unremarkable CT ABDOMEN PELVIS FINDINGS Hepatobiliary: Unremarkable Pancreas: Unremarkable Spleen: There is a flat mass of soft tissue along the splenic hilum  measuring 5.0 by 1.4 cm on image 56/3, previously 4.1 by 1.3 cm, and likely representing conglomerate adenopathy. This is hypodense relative to the splenic parenchyma and is not thought to represent an accessory spleen. Adrenals/Urinary Tract: Adrenal glands and kidneys appear normal. Displacement of the left ureter by the retroperitoneal mass. Stomach/Bowel: Prominent stool throughout the colon favors constipation. 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 There is some mild atherosclerotic calcification at the aortic bifurcation. Extensive adenopathy in the right gastric chain, porta hepatis and peripancreatic region, celiac and upper mesenteric region, retroperitoneum, omental, central mesentery, and along the iliac chains. A dominant left  periaortic mass or node measures 4.8 cm in short axis, previously by my measurement 4.5 cm. A portacaval node measures 2.5 cm in short axis on image 64/3, formerly 2.3 cm. A left external iliac lymph node measures 3.2 cm in short axis on image 114/3, previously 2.9 cm. Reproductive: Unremarkable Other: No supplemental non-categorized findings. Musculoskeletal: Unremarkable IMPRESSION: 1. Progressive bulky adenopathy in the chest, abdomen, and pelvis, compatible with mild worsening of disease. 2. 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 3. Aortic Atherosclerosis (ICD10-I70.0) and Emphysema (ICD10-J43.9). 4. Stable small lung nodules, unchanged from earliest available comparison of 01/21/2017. Continued surveillance likely warranted. 5.  Prominent stool throughout the colon favors constipation. Electronically Signed   By: Van Clines M.D.   On: 01/15/2018 14:41   Mr Jeri Cos LZ Contrast  Result Date: 01/01/2018 CLINICAL DATA:  47 y/o F; history of chronic lymphocytic leukemia. Posterior headache with sharp pains, left leg weakness, and  right facial pain with numbness for 1 month. EXAM: MRI HEAD WITHOUT AND WITH CONTRAST TECHNIQUE: Multiplanar, multiecho pulse sequences of the brain and surrounding structures were obtained without and with intravenous contrast. CONTRAST:  40m MULTIHANCE GADOBENATE DIMEGLUMINE 529 MG/ML IV SOLN COMPARISON:  10/01/2017 CT of the neck. FINDINGS: Brain: No acute infarction, hemorrhage, hydrocephalus, extra-axial collection or mass lesion. No abnormal enhancement. Vascular: Normal flow voids. Skull and upper cervical spine: Normal marrow signal. Sinuses/Orbits: Negative. Other: Left suboccipital scalp 12 mm low signal focus with calcifications on CT neck, likely sebaceous cyst. Partially visualized prominent upper cervical lymph nodes compatible with history of CLL. IMPRESSION: 1. No acute intracranial abnormality.  Normal MRI of the brain. 2. Enlarged upper cervical lymph nodes, partially visualized, compatible with history of CLL. Electronically Signed   By: LKristine GarbeM.D.   On: 01/01/2018 13:52   Ct Abdomen Pelvis W Contrast  Result Date: 01/15/2018 CLINICAL DATA:  Follow up of chronic lymphocytic leukemia. Restaging. EXAM: CT CHEST, ABDOMEN, AND PELVIS WITH CONTRAST TECHNIQUE: Multidetector CT imaging of the chest, abdomen and pelvis was performed following the standard protocol during bolus administration of intravenous contrast. CONTRAST:  1059mISOVUE-300 IOPAMIDOL (ISOVUE-300) INJECTION 61% COMPARISON:  CT scan 10/01/2017 FINDINGS: CT CHEST FINDINGS Cardiovascular: Unremarkable Mediastinum/Nodes: Notable supraclavicular, axillary, subpectoral, paratracheal, bilateral hilar, right infrahilar, and subcarinal adenopathy. An index left axillary lymph node measures 2.0 cm in short axis on image 16/3, previously 1.7 cm. An index subpectoral lymph node measures 2.1 cm in short axis on image 10/3, previously 1.6 cm. Index right paratracheal node 1.4 cm in short axis on image 18/3, previously 1.1 cm.  Subcarinal node 1.7 cm in short axis on image 25/3, formerly the same. Lungs/Pleura: Centrilobular emphysema. 5 by 4 mm right upper lobe subpleural nodule on image 36/5, stable compared to earliest available comparison of 01/21/2017. Mild subpleural nodularity along the minor fissure including a 0.6 by 0.5 cm nodule on image 74/5, stable. Additional small nodules in the right middle lobe and left lower lobe are stable compared to 01/21/2017. Musculoskeletal: Unremarkable CT ABDOMEN PELVIS FINDINGS Hepatobiliary: Unremarkable Pancreas: Unremarkable Spleen: There is a flat mass of soft tissue along the splenic hilum measuring 5.0 by 1.4 cm on image 56/3, previously 4.1 by 1.3 cm, and likely representing conglomerate adenopathy. This is hypodense relative to the splenic parenchyma and is not thought to represent an accessory spleen. Adrenals/Urinary Tract: Adrenal glands and kidneys appear normal. Displacement of the left ureter by the retroperitoneal mass. Stomach/Bowel: Prominent stool throughout the colon favors constipation. 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 There is some mild atherosclerotic calcification at the aortic bifurcation. Extensive adenopathy in the right gastric chain, porta hepatis and peripancreatic region, celiac and upper mesenteric region, retroperitoneum, omental, central mesentery, and along the iliac chains. A dominant left periaortic mass or node measures 4.8 cm in short axis, previously by my measurement 4.5 cm. A portacaval node measures 2.5 cm in short axis on image 64/3, formerly 2.3 cm. A left external iliac lymph node measures 3.2 cm in short axis on image 114/3, previously 2.9 cm. Reproductive: Unremarkable Other: No supplemental non-categorized findings. Musculoskeletal: Unremarkable IMPRESSION: 1. Progressive bulky adenopathy in the chest, abdomen, and pelvis, compatible with mild  worsening of disease. 2. 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 3. Aortic Atherosclerosis (ICD10-I70.0) and Emphysema (ICD10-J43.9). 4. Stable small lung nodules, unchanged from earliest available comparison of 01/21/2017. Continued surveillance likely warranted. 5.  Prominent stool throughout the colon favors constipation. Electronically Signed   By: Van Clines M.D.   On: 01/15/2018 14:41   US Breast Limited Uni Right Inc Axilla  Result Date: 12/26/2017 CLINICAL DATA:  Left subareolar breast palpable mass felt by the patient and her clinician. History of CLL. EXAM: DIGITAL DIAGNOSTIC BILATERAL MAMMOGRAM WITH CAD AND TOMO ULTRASOUND RIGHT BREAST COMPARISON:  Previous exam(s). ACR Breast Density Category b: There are scattered areas of fibroglandular density. FINDINGS: Mammographically, there are no suspicious masses or areas of architectural distortion in the left breast. There is right nipple inversion. There is an ill-defined mass in the slightly lower outer right breast, anterior depth measuring 1.5 cm in greatest dimension mammographically. Additionally, there is a 8 mm circumscribed nodule in the right breast upper outer quadrant, middle depth. Mammographic images were processed with CAD. On physical exam, there is a moderately firm approximately 1-2 cm mass in the subareolar right breast, with slight retraction of the nipple. Targeted ultrasound is performed, showing right breast 8 o'clock subareolar location hypoechoic solid mass measuring 1.3 x 1.3 x 0.8 cm. Questionable ductal extension is seen to the nipple. In the right breast 10 o'clock 3 cm from the nipple there is a second irregular mass which measures 0.5 x 0.5 x 0.3 cm. This likely corresponds to the nodule seen mammographically in the upper outer quadrant. In the right breast 10 o'clock 5 cm from the nipple there is a tiny 2 x 2 x 4 mm hypoechoic nodule. Ultrasound examination of the right  axilla demonstrates 6 grossly abnormal lymph nodes. IMPRESSION: Right breast 8 o'clock subareolar suspicious mass, which corresponds to the area of palpable concern, with possible extension to the nipple and associated nipple retraction. Right breast 10 o'clock 3 cm from the nipple 5 mm suspicious mass. Right breast 10 o'clock 5 cm from the nipple 4 mm possible satellite nodule. Six grossly abnormal right axillary lymph nodes. No evidence of malignancy in the left breast. RECOMMENDATION: Ultrasound-guided core needle biopsy of the right breast subareolar 8 o'clock mass, 10 o'clock 3 cm from the nipple mass, and 1 of the abnormal right axillary lymph nodes. I have discussed the findings and recommendations with the patient. Results were also provided in writing at the conclusion of the visit. If applicable, a reminder letter will be sent to the patient regarding the next appointment. BI-RADS CATEGORY  5: Highly suggestive of malignancy. Electronically Signed   By: Fidela Salisbury M.D.   On: 12/26/2017 16:18   Mm Diag Breast  Tomo Bilateral  Result Date: 12/26/2017 CLINICAL DATA:  Left subareolar breast palpable mass felt by the patient and her clinician. History of CLL. EXAM: DIGITAL DIAGNOSTIC BILATERAL MAMMOGRAM WITH CAD AND TOMO ULTRASOUND RIGHT BREAST COMPARISON:  Previous exam(s). ACR Breast Density Category b: There are scattered areas of fibroglandular density. FINDINGS: Mammographically, there are no suspicious masses or areas of architectural distortion in the left breast. There is right nipple inversion. There is an ill-defined mass in the slightly lower outer right breast, anterior depth measuring 1.5 cm in greatest dimension mammographically. Additionally, there is a 8 mm circumscribed nodule in the right breast upper outer quadrant, middle depth. Mammographic images were processed with CAD. On physical exam, there is a moderately firm approximately 1-2 cm mass in the subareolar right breast, with  slight retraction of the nipple. Targeted ultrasound is performed, showing right breast 8 o'clock subareolar location hypoechoic solid mass measuring 1.3 x 1.3 x 0.8 cm. Questionable ductal extension is seen to the nipple. In the right breast 10 o'clock 3 cm from the nipple there is a second irregular mass which measures 0.5 x 0.5 x 0.3 cm. This likely corresponds to the nodule seen mammographically in the upper outer quadrant. In the right breast 10 o'clock 5 cm from the nipple there is a tiny 2 x 2 x 4 mm hypoechoic nodule. Ultrasound examination of the right axilla demonstrates 6 grossly abnormal lymph nodes. IMPRESSION: Right breast 8 o'clock subareolar suspicious mass, which corresponds to the area of palpable concern, with possible extension to the nipple and associated nipple retraction. Right breast 10 o'clock 3 cm from the nipple 5 mm suspicious mass. Right breast 10 o'clock 5 cm from the nipple 4 mm possible satellite nodule. Six grossly abnormal right axillary lymph nodes. No evidence of malignancy in the left breast. RECOMMENDATION: Ultrasound-guided core needle biopsy of the right breast subareolar 8 o'clock mass, 10 o'clock 3 cm from the nipple mass, and 1 of the abnormal right axillary lymph nodes. I have discussed the findings and recommendations with the patient. Results were also provided in writing at the conclusion of the visit. If applicable, a reminder letter will be sent to the patient regarding the next appointment. BI-RADS CATEGORY  5: Highly suggestive of malignancy. Electronically Signed   By: Fidela Salisbury M.D.   On: 12/26/2017 16:18   Mm Clip Placement Right  Result Date: 01/05/2018 CLINICAL DATA:  Patient is status post ultrasound-guided core needle biopsy of 8 o'clock position retroareolar right breast mass, a 10 o'clock positions smaller right breast mass and an abnormal right axillary lymph node. EXAM: DIAGNOSTIC RIGHT MAMMOGRAM POST ULTRASOUND BIOPSY COMPARISON:  Previous  exam(s). FINDINGS: Mammographic images were obtained following ultrasound guided biopsy of 2 right breast masses and an abnormal right axillary lymph node. The coil shaped biopsy clip lies within the retroareolar breast mass. The heart shaped biopsy clip lies in the expected location of the smaller, 10 o'clock position mass. The [employer] biopsy clip use for the right axillary lymph node is not visualized on the included field of view. IMPRESSION: Well-positioned biopsy clips in the right breast following ultrasound-guided core needle biopsy 2 right breast masses. The axillary lymph node Hydro[employer] biopsy clip is not visualized mammographically. Final Assessment: Post Procedure Mammograms for Marker Placement Electronically Signed   By: Lajean Manes M.D.   On: 01/05/2018 09:39   [place] Rt Breast Bx W Loc Dev 1st Lesion Img Bx Spec US Guide  Addendum Date: 01/08/2018   ADDENDUM REPORT: 01/08/2018  09:38 ADDENDUM: The 8 o'clock subareolar mass biopsy demonstrates no atypia or malignancy. 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 The findings are concordant. Recommend surgical consultation for management. I favor granulomatous mastitis over infection given the lack of acute symptoms prior to the biopsy. The mass at 10 o'clock, 3 cm from the nipple demonstrates fibrocystic changes. Imaging and pathology findings are concordant. No follow-up necessary in this region. 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 The findings are concordant. Recommend continued oncologic follow up. The patient also had a mass at 10 o'clock, 5 cm from the nipple which was not biopsied. Recommend a six-month follow-up ultrasound to ensure stability or resolution. These findings will be called to the patient today. Electronically Signed   By: Dorise Bullion III M.D   On: 01/08/2018 09:38   Result Date: 01/08/2018 CLINICAL DATA:  Patient  presents for ultrasound-guided core needle biopsy of a 8 o'clock position retroareolar right breast mass, initially found by palpation. A smaller adjacent 10 o'clock position, 3 cm the nipple, mass was also discovered on diagnostic imaging, also to undergo ultrasound-guided core needle biopsy. In addition, patient had 6 enlarged abnormal right axillary lymph nodes, 1 of which will be biopsied under ultrasound guidance. Patient carries the presumptive diagnosis of CLL. EXAM: ULTRASOUND GUIDED RIGHT BREAST CORE NEEDLE BIOPSY: Lesion #1 ULTRASOUND GUIDED RIGHT BREAST CORE NEEDLE BIOPSY: Lesion #2 ULTRASOUND GUIDED RIGHT AXILLARY LYMPH NODE CORE NEEDLE BIOPSY: Lesion #3 COMPARISON:  Previous exam(s). FINDINGS: I met with the patient and we discussed the procedure of ultrasound-guided biopsy, including benefits and alternatives. We discussed the high likelihood of a successful procedure. We discussed the risks of the procedure, including infection, bleeding, tissue injury, clip migration, and inadequate sampling. Informed written consent was given. The usual time-out protocol was performed immediately prior to the procedure. Lesion #1: 8 o'clock retroareolar. Lesion quadrant: Lower inner quadrant Using sterile technique and 1% Lidocaine as local anesthetic, under direct ultrasound visualization, a 12 gauge spring-loaded device was used to perform biopsy of the retroareolar 1.3 cm mass using a lateral approach. At the conclusion of the procedure a coil shaped tissue marker clip was deployed into the biopsy cavity. Lesion #2: 10 o'clock, 3 cm from the nipple. Lesion quadrant: Upper outer quadrant Using sterile technique and 1% Lidocaine as local anesthetic, under direct ultrasound visualization, a 12 gauge spring-loaded device was used to perform biopsy of the 10 o'clock position mass using an inferior approach. At the conclusion of the procedure a ribbon shaped tissue marker clip was deployed into the biopsy cavity.  Lesion #3: Abnormal right axillary lymph node. Using sterile technique and 1% Lidocaine as local anesthetic, under direct ultrasound visualization, a 14 gauge spring-loaded device was used to perform biopsy of 1 of the abnormal right axillary lymph nodes using an inferior approach. At the conclusion of the procedure a HydroMARK tissue marker clip was deployed into the biopsy cavity. Follow up 2 view mammogram was performed and dictated separately. IMPRESSION: Ultrasound guided biopsy of 3 lesions, 2 in the right breast and 1 abnormal right axillary lymph node. No apparent complications. Electronically Signed: By: Lajean Manes M.D. On: 01/05/2018 09:34   [place] Rt Breast Bx W Loc Dev Ea Add Lesion Img Bx Spec US Guide  Addendum Date: 01/08/2018   ADDENDUM REPORT: 01/08/2018 09:38 ADDENDUM: The 8 o'clock subareolar mass biopsy demonstrates no atypia or malignancy. 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 The findings are concordant. Recommend surgical consultation for management. I favor granulomatous mastitis over infection given the lack of acute symptoms prior to the biopsy. The mass at 10 o'clock, 3 cm from the nipple demonstrates fibrocystic changes. Imaging and pathology findings are concordant. No follow-up necessary in this region. 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 The findings are concordant. Recommend continued oncologic follow up. The patient also had a mass at 10 o'clock, 5 cm from the nipple which was not biopsied. Recommend a six-month follow-up ultrasound to ensure stability or resolution. These findings will be called to the patient today. Electronically Signed   By: Dorise Bullion III M.D   On: 01/08/2018 09:38   Result Date: 01/08/2018 CLINICAL DATA:  Patient presents for ultrasound-guided core needle biopsy of a 8 o'clock position retroareolar right breast mass, initially found  by palpation. A smaller adjacent 10 o'clock position, 3 cm the nipple, mass was also discovered on diagnostic imaging, also to undergo ultrasound-guided core needle biopsy. In addition, patient had 6 enlarged abnormal right axillary lymph nodes, 1 of which will be biopsied under ultrasound guidance. Patient carries the presumptive diagnosis of CLL. EXAM: ULTRASOUND GUIDED RIGHT BREAST CORE NEEDLE BIOPSY: Lesion #1 ULTRASOUND GUIDED RIGHT BREAST CORE NEEDLE BIOPSY: Lesion #2 ULTRASOUND GUIDED RIGHT AXILLARY LYMPH NODE CORE NEEDLE BIOPSY: Lesion #3 COMPARISON:  Previous exam(s). FINDINGS: I met with the patient and we discussed the procedure of ultrasound-guided biopsy, including benefits and alternatives. We discussed the high likelihood of a successful procedure. We discussed the risks of the procedure, including infection, bleeding, tissue injury, clip migration, and inadequate sampling. Informed written consent was given. The usual time-out protocol was performed immediately prior to the procedure. Lesion #1: 8 o'clock retroareolar. Lesion quadrant: Lower inner quadrant Using sterile technique and 1% Lidocaine as local anesthetic, under direct ultrasound visualization, a 12 gauge spring-loaded device was used to perform biopsy of the retroareolar 1.3 cm mass using a lateral approach. At the conclusion of the procedure a coil shaped tissue marker clip was deployed into the biopsy cavity. Lesion #2: 10 o'clock, 3 cm from the nipple. Lesion quadrant: Upper outer quadrant Using sterile technique and 1% Lidocaine as local anesthetic, under direct ultrasound visualization, a 12 gauge spring-loaded device was used to perform biopsy of the 10 o'clock position mass using an inferior approach. At the conclusion of the procedure a ribbon shaped tissue marker clip was deployed into the biopsy cavity. Lesion #3: Abnormal right axillary lymph node. Using sterile technique and 1% Lidocaine as local anesthetic, under direct  ultrasound visualization, a 14 gauge spring-loaded device was used to perform biopsy of 1 of the abnormal right axillary lymph nodes using an inferior approach. At the conclusion of the procedure a HydroMARK tissue marker clip was deployed into the biopsy cavity. Follow up 2 view mammogram was performed and dictated separately. IMPRESSION: Ultrasound guided biopsy of 3 lesions, 2 in the right breast and 1 abnormal right axillary lymph node. No apparent complications. Electronically Signed: By: Lajean Manes M.D. On: 01/05/2018 09:34   [place] Rt Breast Bx W Loc Dev Ea Add Lesion Img Bx Spec US Guide  Addendum Date: 01/08/2018   ADDENDUM REPORT: 01/08/2018 09:38 ADDENDUM: The 8 o'clock subareolar mass biopsy demonstrates no atypia or malignancy. 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 The findings are concordant. Recommend surgical consultation for management. I favor granulomatous mastitis over infection given the  lack of acute symptoms prior to the biopsy. The mass at 10 o'clock, 3 cm from the nipple demonstrates fibrocystic changes. Imaging and pathology findings are concordant. No follow-up necessary in this region. 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 The findings are concordant. Recommend continued oncologic follow up. The patient also had a mass at 10 o'clock, 5 cm from the nipple which was not biopsied. Recommend a six-month follow-up ultrasound to ensure stability or resolution. These findings will be called to the patient today. Electronically Signed   By: Dorise Bullion III M.D   On: 01/08/2018 09:38   Result Date: 01/08/2018 CLINICAL DATA:  Patient presents for ultrasound-guided core needle biopsy of a 8 o'clock position retroareolar right breast mass, initially found by palpation. A smaller adjacent 10 o'clock position, 3 cm the nipple, mass was also discovered on diagnostic imaging, also  to undergo ultrasound-guided core needle biopsy. In addition, patient had 6 enlarged abnormal right axillary lymph nodes, 1 of which will be biopsied under ultrasound guidance. Patient carries the presumptive diagnosis of CLL. EXAM: ULTRASOUND GUIDED RIGHT BREAST CORE NEEDLE BIOPSY: Lesion #1 ULTRASOUND GUIDED RIGHT BREAST CORE NEEDLE BIOPSY: Lesion #2 ULTRASOUND GUIDED RIGHT AXILLARY LYMPH NODE CORE NEEDLE BIOPSY: Lesion #3 COMPARISON:  Previous exam(s). FINDINGS: I met with the patient and we discussed the procedure of ultrasound-guided biopsy, including benefits and alternatives. We discussed the high likelihood of a successful procedure. We discussed the risks of the procedure, including infection, bleeding, tissue injury, clip migration, and inadequate sampling. Informed written consent was given. The usual time-out protocol was performed immediately prior to the procedure. Lesion #1: 8 o'clock retroareolar. Lesion quadrant: Lower inner quadrant Using sterile technique and 1% Lidocaine as local anesthetic, under direct ultrasound visualization, a 12 gauge spring-loaded device was used to perform biopsy of the retroareolar 1.3 cm mass using a lateral approach. At the conclusion of the procedure a coil shaped tissue marker clip was deployed into the biopsy cavity. Lesion #2: 10 o'clock, 3 cm from the nipple. Lesion quadrant: Upper outer quadrant Using sterile technique and 1% Lidocaine as local anesthetic, under direct ultrasound visualization, a 12 gauge spring-loaded device was used to perform biopsy of the 10 o'clock position mass using an inferior approach. At the conclusion of the procedure a ribbon shaped tissue marker clip was deployed into the biopsy cavity. Lesion #3: Abnormal right axillary lymph node. Using sterile technique and 1% Lidocaine as local anesthetic, under direct ultrasound visualization, a 14 gauge spring-loaded device was used to perform biopsy of 1 of the abnormal right axillary lymph  nodes using an inferior approach. At the conclusion of the procedure a HydroMARK tissue marker clip was deployed into the biopsy cavity. Follow up 2 view mammogram was performed and dictated separately. IMPRESSION: Ultrasound guided biopsy of 3 lesions, 2 in the right breast and 1 abnormal right axillary lymph node. No apparent complications. Electronically Signed: By: Lajean Manes M.D. On: 01/05/2018 09:34    ASSESSMENT: CLL, Rai stage 1, breast mass  PLAN:    1. CLL: Confirmed by peripheral blood flow cytometry.  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  CT scans from May second 2019 reviewed independently and report as above confirming progression of disease.  Proceed with cycle 1, day 1 of Rituxan and Treanda today.  Return to clinic tomorrow for Treanda only, in 1 week for laboratory work and further evaluation, and then in 4 weeks for  consideration of cycle 2, day 1.   2. Lymphadenopathy: Secondary CLL.  Treatment as above 3.  Leukocytosis: Secondary to CLL.  Treatment as above. 4.  Breast mass: Pathology results reviewed independently reporting benign disease.  Monitor.  Approximately 30 minutes was spent in discussion of which greater than 50% was consultation.  Patient expressed understanding and was in agreement with this plan. She also understands that She can call clinic at any time with any questions, concerns, or complaints.    Lloyd Huger, MD   01/21/2018 10:18 AM  Addendum: Patient unable to get treatment today secondary to insurance approval.  Once this is obtained, will call patient back to initiate cycle 1.

## 2018-01-20 ENCOUNTER — Other Ambulatory Visit: Payer: Self-pay | Admitting: Oncology

## 2018-01-20 DIAGNOSIS — C9110 Chronic lymphocytic leukemia of B-cell type not having achieved remission: Secondary | ICD-10-CM

## 2018-01-20 MED ORDER — ONDANSETRON HCL 8 MG PO TABS: 8.0000 mg | ORAL_TABLET | Freq: Two times a day (BID) | ORAL | 2 refills | Status: DC | PRN

## 2018-01-20 MED ORDER — PROCHLORPERAZINE MALEATE 10 MG PO TABS: 10.0000 mg | ORAL_TABLET | Freq: Four times a day (QID) | ORAL | 2 refills | Status: DC | PRN

## 2018-01-20 NOTE — Progress Notes (Signed)
DISCONTINUE OFF PATHWAY REGIMEN - Lymphoma and CLL   [id number]:Rituximab (Weekly):   Administer weekly:     Rituximab   **Always confirm dose/schedule in your pharmacy ordering system**    REASON: Disease Progression PRIOR TREATMENT: Off Pathway: Rituximab (Weekly) TREATMENT RESPONSE: Complete Response (CR)  START OFF PATHWAY REGIMEN - Lymphoma and CLL   [id number]:Bendamustine 90 mg/m2 D1, 2 + Rituximab (IV/Subcut) D1 q28 Days:   A cycle is every 28 days:     Bendamustine      Rituximab      Rituximab and hyaluronidase human   **Always confirm dose/schedule in your pharmacy ordering system**    Patient Characteristics: Chronic Lymphocytic Leukemia (CLL), Second Line, 17p del (-) or Unknown Disease Type: Chronic Lymphocytic Leukemia (CLL) Disease Type: Not Applicable Disease Type: Not Applicable Line of therapy: Second Line RAI Stage: I 17p Deletion Status: Unknown Intent of Therapy: Non-Curative / Palliative Intent, Discussed with Patient

## 2018-01-21 ENCOUNTER — Other Ambulatory Visit: Payer: Self-pay

## 2018-01-21 ENCOUNTER — Inpatient Hospital Stay: Payer: BLUE CROSS/BLUE SHIELD

## 2018-01-21 ENCOUNTER — Inpatient Hospital Stay (HOSPITAL_BASED_OUTPATIENT_CLINIC_OR_DEPARTMENT_OTHER): Payer: BLUE CROSS/BLUE SHIELD | Admitting: Oncology

## 2018-01-21 VITALS — BP 98/64 | HR 72 | Temp 95.9°F | Resp 18 | Wt 153.6 lb

## 2018-01-21 DIAGNOSIS — D72829 Elevated white blood cell count, unspecified: Secondary | ICD-10-CM | POA: Diagnosis not present

## 2018-01-21 DIAGNOSIS — J439 Emphysema, unspecified: Secondary | ICD-10-CM | POA: Diagnosis not present

## 2018-01-21 DIAGNOSIS — R05 Cough: Secondary | ICD-10-CM | POA: Diagnosis not present

## 2018-01-21 DIAGNOSIS — J40 Bronchitis, not specified as acute or chronic: Secondary | ICD-10-CM | POA: Diagnosis not present

## 2018-01-21 DIAGNOSIS — R599 Enlarged lymph nodes, unspecified: Secondary | ICD-10-CM

## 2018-01-21 DIAGNOSIS — Z5112 Encounter for antineoplastic immunotherapy: Secondary | ICD-10-CM | POA: Diagnosis not present

## 2018-01-21 DIAGNOSIS — Z5111 Encounter for antineoplastic chemotherapy: Secondary | ICD-10-CM | POA: Diagnosis not present

## 2018-01-21 DIAGNOSIS — C9110 Chronic lymphocytic leukemia of B-cell type not having achieved remission: Secondary | ICD-10-CM

## 2018-01-21 DIAGNOSIS — N63 Unspecified lump in unspecified breast: Secondary | ICD-10-CM

## 2018-01-21 DIAGNOSIS — F329 Major depressive disorder, single episode, unspecified: Secondary | ICD-10-CM | POA: Diagnosis not present

## 2018-01-21 DIAGNOSIS — R062 Wheezing: Secondary | ICD-10-CM | POA: Diagnosis not present

## 2018-01-21 DIAGNOSIS — J040 Acute laryngitis: Secondary | ICD-10-CM | POA: Diagnosis not present

## 2018-01-21 DIAGNOSIS — F429 Obsessive-compulsive disorder, unspecified: Secondary | ICD-10-CM | POA: Diagnosis not present

## 2018-01-21 DIAGNOSIS — Z720 Tobacco use: Secondary | ICD-10-CM | POA: Diagnosis not present

## 2018-01-21 DIAGNOSIS — M545 Low back pain: Secondary | ICD-10-CM | POA: Diagnosis not present

## 2018-01-21 DIAGNOSIS — J449 Chronic obstructive pulmonary disease, unspecified: Secondary | ICD-10-CM | POA: Diagnosis not present

## 2018-01-21 LAB — COMPREHENSIVE METABOLIC PANEL
ALK PHOS: 63 U/L (ref 38–126)
ALT: 18 U/L (ref 14–54)
ANION GAP: 9 (ref 5–15)
AST: 26 U/L (ref 15–41)
Albumin: 3.6 g/dL (ref 3.5–5.0)
BILIRUBIN TOTAL: 0.6 mg/dL (ref 0.3–1.2)
BUN: 18 mg/dL (ref 6–20)
CALCIUM: 8.6 mg/dL — AB (ref 8.9–10.3)
CO2: 27 mmol/L (ref 22–32)
CREATININE: 0.93 mg/dL (ref 0.44–1.00)
Chloride: 102 mmol/L (ref 101–111)
GFR calc non Af Amer: >60 mL/min (ref >60)
Glucose, Bld: 104 mg/dL — ABNORMAL HIGH (ref 65–99)
Potassium: 3.9 mmol/L (ref 3.5–5.1)
Sodium: 138 mmol/L (ref 135–145)
TOTAL PROTEIN: 6.1 g/dL — AB (ref 6.5–8.1)

## 2018-01-21 LAB — CBC WITH DIFFERENTIAL/PLATELET
BASOS ABS: 0.1 10*3/uL (ref 0–0.1)
Basophils Relative: 0 %
EOS ABS: 0.1 10*3/uL (ref 0–0.7)
Eosinophils Relative: 0 %
HEMATOCRIT: 38.2 % (ref 35.0–47.0)
HEMOGLOBIN: 12.9 g/dL (ref 12.0–16.0)
Lymphocytes Relative: 90 %
Lymphs Abs: 30.9 10*3/uL — ABNORMAL HIGH (ref 1.0–3.6)
MCH: 33.1 pg (ref 26.0–34.0)
MCHC: 33.6 g/dL (ref 32.0–36.0)
MCV: 98.3 fL (ref 80.0–100.0)
MONO ABS: 0.7 10*3/uL (ref 0.2–0.9)
Monocytes Relative: 2 %
NEUTROS ABS: 2.6 10*3/uL (ref 1.4–6.5)
NEUTROS PCT: 8 %
Platelets: 168 10*3/uL (ref 150–440)
RBC: 3.88 MIL/uL (ref 3.80–5.20)
RDW: 13.4 % (ref 11.5–14.5)
WBC: 34.5 10*3/uL — ABNORMAL HIGH (ref 3.6–11.0)

## 2018-01-21 MED ORDER — DIPHENHYDRAMINE HCL 25 MG PO CAPS: 25.0000 mg | ORAL_CAPSULE | Freq: Once | ORAL | Status: DC

## 2018-01-21 MED ORDER — DEXAMETHASONE SODIUM PHOSPHATE 10 MG/ML IJ SOLN: 10.0000 mg | Freq: Once | INTRAMUSCULAR | Status: DC

## 2018-01-21 MED ORDER — ACETAMINOPHEN 325 MG PO TABS: 650.0000 mg | ORAL_TABLET | Freq: Once | ORAL | Status: DC

## 2018-01-21 MED ORDER — SODIUM CHLORIDE 0.9 % IV SOLN
Freq: Once | INTRAVENOUS | Status: DC
  Filled 2018-01-21: qty 1000

## 2018-01-21 MED ORDER — SODIUM CHLORIDE 0.9 % IV SOLN
90.0000 mg/m2 | Freq: Once | INTRAVENOUS | Status: DC
  Filled 2018-01-21: qty 6

## 2018-01-21 MED ORDER — SODIUM CHLORIDE 0.9 % IV SOLN: 10.0000 mg | Freq: Once | INTRAVENOUS | Status: DC

## 2018-01-21 MED ORDER — SODIUM CHLORIDE 0.9 % IV SOLN
375.0000 mg/m2 | Freq: Once | INTRAVENOUS | Status: DC
  Filled 2018-01-21: qty 70

## 2018-01-21 MED ORDER — PALONOSETRON HCL INJECTION 0.25 MG/5ML: 0.2500 mg | Freq: Once | INTRAVENOUS | Status: DC

## 2018-01-21 NOTE — Progress Notes (Signed)
Here for follow up  Anxious about treatment today.

## 2018-01-21 NOTE — Progress Notes (Signed)
At 1045 this morning this nurse informed the patient that the clinic was just notified that her insurance had denied her treatment for today.  RN instructed patient that we had requested an urgent review of patient's treatment plan and faxed appropriate documentation to her insurance company for further review.  RN explained that we would contact her with appointment dates and times once we got approval to proceed with treatment.  If she needed anything or had any further questions or concerns to call the clinic.   Pt was very upset when she was given the above information.  After having infusion nurse d/c her IV the patient then abruptly left the clinic.

## 2018-01-22 ENCOUNTER — Inpatient Hospital Stay: Payer: BLUE CROSS/BLUE SHIELD

## 2018-01-22 MED ORDER — SODIUM CHLORIDE 0.9 % IV SOLN: 90.0000 mg/m2 | Freq: Once | INTRAVENOUS | Status: DC

## 2018-01-26 NOTE — Progress Notes (Deleted)
[place]  Telephone:(336) [phone] Fax:(336) [phone]  ID: Monique [name] OB: 1971/07/07  MR#: [id number]  TIW#:[id number]  Patient Care Team: [name], MD as PCP - General (Family Medicine)  CHIEF COMPLAINT: CLL, breast mass.  INTERVAL HISTORY: Patient returns to clinic today for further evaluation and initiation of cycle 1, day 1 of Rituxan and Treanda.  She is anxious, but otherwise feels well.  She has no neurologic complaints. She denies any fevers, illnesses, or weight loss. She has no chest pain or shortness of breath. She denies any nausea, vomiting, constipation, or diarrhea. She has no urinary complaints.  Patient offers no further specific complaints today.  REVIEW OF SYSTEMS:   Review of Systems  Constitutional: Negative.  Negative for fever, malaise/fatigue and weight loss.  HENT: Negative.  Negative for congestion.   Respiratory: Negative for cough and shortness of breath.   Cardiovascular: Negative.  Negative for chest pain and leg swelling.  Gastrointestinal: Negative.  Negative for abdominal pain and constipation.  Genitourinary: Negative.  Negative for dysuria.  Musculoskeletal: Positive for neck pain. Negative for myalgias.  Skin: Negative.  Negative for rash.  Neurological: Positive for tingling. Negative for sensory change, focal weakness and weakness.  Psychiatric/Behavioral: The patient is nervous/anxious.     As per HPI. Otherwise, a complete review of systems is negative.  PAST MEDICAL HISTORY: Past Medical History:  Diagnosis Date  . Anxiety   . Cervical cancer ([place])    hx LEEP over 18 years ago.   . Cervical intraepithelial neoplasia I   . Chronic lymphocytic leukemia ([place])   . Depression   . Eating disorder   . History of self-harm   . Insomnia   . Obsession   . Pap smear abnormality of cervix with LGSIL   . Tobacco abuse   . Vitamin B12 deficiency (non anemic)     PAST SURGICAL HISTORY: Past Surgical History:    Procedure Laterality Date  . BREAST BIOPSY Right 01/05/2018   US guided biopsy of 2 areas and 1 lymph node  . CERVICAL BIOPSY  W/ LOOP ELECTRODE EXCISION    . TUBAL LIGATION      FAMILY HISTORY: Family History  Problem Relation Age of Onset  . Cancer Mother        thyroid  . Alcohol abuse Father   . Depression Sister   . Cancer Sister        cevical  . Alcohol abuse Brother   . Depression Brother   . Bipolar disorder Brother   . ADD / ADHD Son   . Breast cancer Neg Hx     ADVANCED DIRECTIVES (Y/N):  N  HEALTH MAINTENANCE: Social History   Tobacco Use  . Smoking status: Current Every Day Smoker    Packs/day: 1.00    Years: 31.00    Pack years: 31.00    Start date: 09/25/1986  . Smokeless tobacco: Never Used  . Tobacco comment: Would like to discuss Chantix- she forgot to take this medication  Substance Use Topics  . Alcohol use: Yes    Alcohol/week: 0.0 oz    Comment: rarely  . Drug use: No     Colonoscopy:  PAP:  Bone density:  Lipid panel:  No Known Allergies  Current Outpatient Medications  Medication Sig Dispense Refill  . carbamazepine (TEGRETOL) 200 MG tablet TAKE 1 TABLET (200 MG TOTAL) BY MOUTH 3 (THREE) TIMES DAILY. 90 tablet 0  . escitalopram (LEXAPRO) 10 MG tablet     .  gabapentin (NEURONTIN) 100 MG capsule Take 100 mg twice a day for one week, then increase to 200 mg(2 tablets) twice a day and continue    . ondansetron (ZOFRAN) 8 MG tablet Take 1 tablet (8 mg total) by mouth 2 (two) times daily as needed for refractory nausea / vomiting. (Patient not taking: Reported on 01/21/2018) 30 tablet 2  . prochlorperazine (COMPAZINE) 10 MG tablet Take 1 tablet (10 mg total) by mouth every 6 (six) hours as needed (Nausea or vomiting). (Patient not taking: Reported on 01/21/2018) 60 tablet 2  . traMADol (ULTRAM) 50 MG tablet Take by mouth every 6 (six) hours as needed.    . umeclidinium-vilanterol (ANORO ELLIPTA) 62.5-25 MCG/INH AEPB TAKE 1 PUFF BY MOUTH EVERY  DAY 60 each 5   No current facility-administered medications for this visit.     OBJECTIVE: There were no vitals filed for this visit.   There is no height or weight on file to calculate BMI.    ECOG FS:0 - Asymptomatic  General: Well-developed, well-nourished, no acute distress. Eyes: Pink conjunctiva, anicteric sclera. HEENT: Normocephalic, moist mucous membranes, clear oropharnyx. Lungs: Clear to auscultation bilaterally. Heart: Regular rate and rhythm. No rubs, murmurs, or gallops. Abdomen: Soft, nontender, nondistended. No organomegaly noted, normoactive bowel sounds. Musculoskeletal: No edema, cyanosis, or clubbing. Neuro: Alert, answering all questions appropriately. Cranial nerves grossly intact. Skin: No rashes or petechiae noted. Psych: Normal affect. Lymphatics: Palpable cervical and axillary adenopathy.  LAB RESULTS:  Lab Results  Component Value Date   NA 138 01/21/2018   K 3.9 01/21/2018   CL 102 01/21/2018   CO2 27 01/21/2018   GLUCOSE 104 (H) 01/21/2018   BUN 18 01/21/2018   CREATININE 0.93 01/21/2018   CALCIUM 8.6 (L) 01/21/2018   PROT 6.1 (L) 01/21/2018   ALBUMIN 3.6 01/21/2018   AST 26 01/21/2018   ALT 18 01/21/2018   ALKPHOS 63 01/21/2018   BILITOT 0.6 01/21/2018   GFRNONAA >60 01/21/2018   GFRAA >60 01/21/2018    Lab Results  Component Value Date   WBC 34.5 (H) 01/21/2018   NEUTROABS 2.6 01/21/2018   HGB 12.9 01/21/2018   HCT 38.2 01/21/2018   MCV 98.3 01/21/2018   PLT 168 01/21/2018     STUDIES: Ct Soft Tissue Neck W Contrast  Result Date: 01/15/2018 CLINICAL DATA:  47 year old female with chronic lymphocytic leukemia. Restaging. EXAM: CT NECK WITH CONTRAST TECHNIQUE: Multidetector CT imaging of the neck was performed using the standard protocol following the bolus administration of intravenous contrast. CONTRAST:  151m ISOVUE-300 IOPAMIDOL (ISOVUE-300) INJECTION 61% in conjunction with contrast enhanced imaging of the chest, abdomen, and  pelvis reported separately. COMPARISON:  Brain MRI 01/01/2018.  Neck CT 10/01/2017 and earlier. FINDINGS: Pharynx and larynx: Glottis is closed as before. Laryngeal and pharyngeal soft tissue contours remain normal. Negative parapharyngeal and retropharyngeal spaces. Salivary glands: The sublingual space, submandibular glands, and parotid glands are stable and within normal limits. Thyroid: Stable and normal. Lymph nodes: Generalized cervical and upper thoracic lymphadenopathy. The largest nodes in the neck bilaterally are generally 1 millimeter increased compared to the January CT. For example, the largest left level 2 B lymph node on series 7, image 42 measures 11 millimeter short axis and was 8-9 millimeters previously. None of the nodal stations demonstrate decreased lymph node size or number. No cystic or necrotic nodes. Vascular: Major vascular structures in the neck and at the skull base remain patent. Limited intracranial: Negative. Visualized orbits: Negative. Mastoids and visualized paranasal sinuses:  Clear. Skeleton: Negative. Upper chest: Reported separately today. IMPRESSION: 1. Stable to mild progression of CLL since January. Diffuse cervical and upper thoracic lymphadenopathy with many of the largest nodes increased in size by about 1 mm since 10/01/2017. 2.  CT Chest, Abdomen, and Pelvis today are reported separately. Electronically Signed   By: Genevie Ann M.D.   On: 01/15/2018 13:41   Ct Chest W Contrast  Result Date: 01/15/2018 CLINICAL DATA:  Follow up of chronic lymphocytic leukemia. Restaging. EXAM: CT CHEST, ABDOMEN, AND PELVIS WITH CONTRAST TECHNIQUE: Multidetector CT imaging of the chest, abdomen and pelvis was performed following the standard protocol during bolus administration of intravenous contrast. CONTRAST:  163m ISOVUE-300 IOPAMIDOL (ISOVUE-300) INJECTION 61% COMPARISON:  CT scan 10/01/2017 FINDINGS: CT CHEST FINDINGS Cardiovascular: Unremarkable Mediastinum/Nodes: Notable  supraclavicular, axillary, subpectoral, paratracheal, bilateral hilar, right infrahilar, and subcarinal adenopathy. An index left axillary lymph node measures 2.0 cm in short axis on image 16/3, previously 1.7 cm. An index subpectoral lymph node measures 2.1 cm in short axis on image 10/3, previously 1.6 cm. Index right paratracheal node 1.4 cm in short axis on image 18/3, previously 1.1 cm. Subcarinal node 1.7 cm in short axis on image 25/3, formerly the same. Lungs/Pleura: Centrilobular emphysema. 5 by 4 mm right upper lobe subpleural nodule on image 36/5, stable compared to earliest available comparison of 01/21/2017. Mild subpleural nodularity along the minor fissure including a 0.6 by 0.5 cm nodule on image 74/5, stable. Additional small nodules in the right middle lobe and left lower lobe are stable compared to 01/21/2017. Musculoskeletal: Unremarkable CT ABDOMEN PELVIS FINDINGS Hepatobiliary: Unremarkable Pancreas: Unremarkable Spleen: There is a flat mass of soft tissue along the splenic hilum measuring 5.0 by 1.4 cm on image 56/3, previously 4.1 by 1.3 cm, and likely representing conglomerate adenopathy. This is hypodense relative to the splenic parenchyma and is not thought to represent an accessory spleen. Adrenals/Urinary Tract: Adrenal glands and kidneys appear normal. Displacement of the left ureter by the retroperitoneal mass. Stomach/Bowel: Prominent stool throughout the colon favors constipation. 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 There is some mild atherosclerotic calcification at the aortic bifurcation. Extensive adenopathy in the right gastric chain, porta hepatis and peripancreatic region, celiac and upper mesenteric region, retroperitoneum, omental, central mesentery, and along the iliac chains. A dominant left periaortic mass or node measures 4.8 cm in short axis, previously by my measurement 4.5  cm. A portacaval node measures 2.5 cm in short axis on image 64/3, formerly 2.3 cm. A left external iliac lymph node measures 3.2 cm in short axis on image 114/3, previously 2.9 cm. Reproductive: Unremarkable Other: No supplemental non-categorized findings. Musculoskeletal: Unremarkable IMPRESSION: 1. Progressive bulky adenopathy in the chest, abdomen, and pelvis, compatible with mild worsening of disease. 2. 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 3. Aortic Atherosclerosis (ICD10-I70.0) and Emphysema (ICD10-J43.9). 4. Stable small lung nodules, unchanged from earliest available comparison of 01/21/2017. Continued surveillance likely warranted. 5.  Prominent stool throughout the colon favors constipation. Electronically Signed   By: WVan ClinesM.D.   On: 01/15/2018 14:41   Mr BJeri CosWRXContrast  Result Date: 01/01/2018 CLINICAL DATA:  47y/o F; history of chronic lymphocytic leukemia. Posterior headache with sharp pains, left leg weakness, and right facial pain with numbness for 1 month. EXAM: MRI HEAD WITHOUT AND WITH CONTRAST TECHNIQUE: Multiplanar, multiecho pulse sequences of the brain and surrounding structures were obtained without  and with intravenous contrast. CONTRAST:  86m MULTIHANCE GADOBENATE DIMEGLUMINE 529 MG/ML IV SOLN COMPARISON:  10/01/2017 CT of the neck. FINDINGS: Brain: No acute infarction, hemorrhage, hydrocephalus, extra-axial collection or mass lesion. No abnormal enhancement. Vascular: Normal flow voids. Skull and upper cervical spine: Normal marrow signal. Sinuses/Orbits: Negative. Other: Left suboccipital scalp 12 mm low signal focus with calcifications on CT neck, likely sebaceous cyst. Partially visualized prominent upper cervical lymph nodes compatible with history of CLL. IMPRESSION: 1. No acute intracranial abnormality.  Normal MRI of the brain. 2. Enlarged upper cervical lymph nodes, partially visualized, compatible with history  of CLL. Electronically Signed   By: LKristine GarbeM.D.   On: 01/01/2018 13:52   Ct Abdomen Pelvis W Contrast  Result Date: 01/15/2018 CLINICAL DATA:  Follow up of chronic lymphocytic leukemia. Restaging. EXAM: CT CHEST, ABDOMEN, AND PELVIS WITH CONTRAST TECHNIQUE: Multidetector CT imaging of the chest, abdomen and pelvis was performed following the standard protocol during bolus administration of intravenous contrast. CONTRAST:  1025mISOVUE-300 IOPAMIDOL (ISOVUE-300) INJECTION 61% COMPARISON:  CT scan 10/01/2017 FINDINGS: CT CHEST FINDINGS Cardiovascular: Unremarkable Mediastinum/Nodes: Notable supraclavicular, axillary, subpectoral, paratracheal, bilateral hilar, right infrahilar, and subcarinal adenopathy. An index left axillary lymph node measures 2.0 cm in short axis on image 16/3, previously 1.7 cm. An index subpectoral lymph node measures 2.1 cm in short axis on image 10/3, previously 1.6 cm. Index right paratracheal node 1.4 cm in short axis on image 18/3, previously 1.1 cm. Subcarinal node 1.7 cm in short axis on image 25/3, formerly the same. Lungs/Pleura: Centrilobular emphysema. 5 by 4 mm right upper lobe subpleural nodule on image 36/5, stable compared to earliest available comparison of 01/21/2017. Mild subpleural nodularity along the minor fissure including a 0.6 by 0.5 cm nodule on image 74/5, stable. Additional small nodules in the right middle lobe and left lower lobe are stable compared to 01/21/2017. Musculoskeletal: Unremarkable CT ABDOMEN PELVIS FINDINGS Hepatobiliary: Unremarkable Pancreas: Unremarkable Spleen: There is a flat mass of soft tissue along the splenic hilum measuring 5.0 by 1.4 cm on image 56/3, previously 4.1 by 1.3 cm, and likely representing conglomerate adenopathy. This is hypodense relative to the splenic parenchyma and is not thought to represent an accessory spleen. Adrenals/Urinary Tract: Adrenal glands and kidneys appear normal. Displacement of the left  ureter by the retroperitoneal mass. Stomach/Bowel: Prominent stool throughout the colon favors constipation. 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 There is some mild atherosclerotic calcification at the aortic bifurcation. Extensive adenopathy in the right gastric chain, porta hepatis and peripancreatic region, celiac and upper mesenteric region, retroperitoneum, omental, central mesentery, and along the iliac chains. A dominant left periaortic mass or node measures 4.8 cm in short axis, previously by my measurement 4.5 cm. A portacaval node measures 2.5 cm in short axis on image 64/3, formerly 2.3 cm. A left external iliac lymph node measures 3.2 cm in short axis on image 114/3, previously 2.9 cm. Reproductive: Unremarkable Other: No supplemental non-categorized findings. Musculoskeletal: Unremarkable IMPRESSION: 1. Progressive bulky adenopathy in the chest, abdomen, and pelvis, compatible with mild worsening of disease. 2. 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 3. Aortic Atherosclerosis (ICD10-I70.0) and Emphysema (ICD10-J43.9). 4. Stable small lung nodules, unchanged from earliest available comparison of 01/21/2017. Continued surveillance likely warranted. 5.  Prominent stool throughout the colon favors constipation. Electronically Signed   By: WaVan Clines.D.   On: 01/15/2018 14:41   Mm Clip Placement  Right  Result Date: 01/05/2018 CLINICAL DATA:  Patient is status post ultrasound-guided core needle biopsy of 8 o'clock position retroareolar right breast mass, a 10 o'clock positions smaller right breast mass and an abnormal right axillary lymph node. EXAM: DIAGNOSTIC RIGHT MAMMOGRAM POST ULTRASOUND BIOPSY COMPARISON:  Previous exam(s). FINDINGS: Mammographic images were obtained following ultrasound guided biopsy of 2 right breast masses and an abnormal right  axillary lymph node. The coil shaped biopsy clip lies within the retroareolar breast mass. The heart shaped biopsy clip lies in the expected location of the smaller, 10 o'clock position mass. The [facility] biopsy clip use for the right axillary lymph node is not visualized on the included field of view. IMPRESSION: Well-positioned biopsy clips in the right breast following ultrasound-guided core needle biopsy 2 right breast masses. The axillary lymph node HydroMARK biopsy clip is not visualized mammographically. Final Assessment: Post Procedure Mammograms for Marker Placement Electronically Signed   By: Lajean Manes M.D.   On: 01/05/2018 09:39   [place] Rt Breast Bx W Loc Dev 1st Lesion Img Bx Spec US Guide  Addendum Date: 01/08/2018   ADDENDUM REPORT: 01/08/2018 09:38 ADDENDUM: The 8 o'clock subareolar mass biopsy demonstrates no atypia or malignancy. 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 The findings are concordant. Recommend surgical consultation for management. I favor granulomatous mastitis over infection given the lack of acute symptoms prior to the biopsy. The mass at 10 o'clock, 3 cm from the nipple demonstrates fibrocystic changes. Imaging and pathology findings are concordant. No follow-up necessary in this region. 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 The findings are concordant. Recommend continued oncologic follow up. The patient also had a mass at 10 o'clock, 5 cm from the nipple which was not biopsied. Recommend a six-month follow-up ultrasound to ensure stability or resolution. These findings will be called to the patient today. Electronically Signed   By: Dorise Bullion III M.D   On: 01/08/2018 09:38   Result Date: 01/08/2018 CLINICAL DATA:  Patient presents for ultrasound-guided core needle biopsy of a 8 o'clock position retroareolar right breast mass, initially found by  palpation. A smaller adjacent 10 o'clock position, 3 cm the nipple, mass was also discovered on diagnostic imaging, also to undergo ultrasound-guided core needle biopsy. In addition, patient had 6 enlarged abnormal right axillary lymph nodes, 1 of which will be biopsied under ultrasound guidance. Patient carries the presumptive diagnosis of CLL. EXAM: ULTRASOUND GUIDED RIGHT BREAST CORE NEEDLE BIOPSY: Lesion #1 ULTRASOUND GUIDED RIGHT BREAST CORE NEEDLE BIOPSY: Lesion #2 ULTRASOUND GUIDED RIGHT AXILLARY LYMPH NODE CORE NEEDLE BIOPSY: Lesion #3 COMPARISON:  Previous exam(s). FINDINGS: I met with the patient and we discussed the procedure of ultrasound-guided biopsy, including benefits and alternatives. We discussed the high likelihood of a successful procedure. We discussed the risks of the procedure, including infection, bleeding, tissue injury, clip migration, and inadequate sampling. Informed written consent was given. The usual time-out protocol was performed immediately prior to the procedure. Lesion #1: 8 o'clock retroareolar. Lesion quadrant: Lower inner quadrant Using sterile technique and 1% Lidocaine as local anesthetic, under direct ultrasound visualization, a 12 gauge spring-loaded device was used to perform biopsy of the retroareolar 1.3 cm mass using a lateral approach. At the conclusion of the procedure a coil shaped tissue marker clip was deployed into the biopsy cavity. Lesion #2: 10 o'clock, 3 cm from the nipple. Lesion quadrant: Upper outer quadrant Using sterile technique and 1% Lidocaine as  local anesthetic, under direct ultrasound visualization, a 12 gauge spring-loaded device was used to perform biopsy of the 10 o'clock position mass using an inferior approach. At the conclusion of the procedure a ribbon shaped tissue marker clip was deployed into the biopsy cavity. Lesion #3: Abnormal right axillary lymph node. Using sterile technique and 1% Lidocaine as local anesthetic, under direct  ultrasound visualization, a 14 gauge spring-loaded device was used to perform biopsy of 1 of the abnormal right axillary lymph nodes using an inferior approach. At the conclusion of the procedure a HydroMARK tissue marker clip was deployed into the biopsy cavity. Follow up 2 view mammogram was performed and dictated separately. IMPRESSION: Ultrasound guided biopsy of 3 lesions, 2 in the right breast and 1 abnormal right axillary lymph node. No apparent complications. Electronically Signed: By: Lajean Manes M.D. On: 01/05/2018 09:34   [place] Rt Breast Bx W Loc Dev Ea Add Lesion Img Bx Spec US Guide  Addendum Date: 01/08/2018   ADDENDUM REPORT: 01/08/2018 09:38 ADDENDUM: The 8 o'clock subareolar mass biopsy demonstrates no atypia or malignancy. 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 The findings are concordant. Recommend surgical consultation for management. I favor granulomatous mastitis over infection given the lack of acute symptoms prior to the biopsy. The mass at 10 o'clock, 3 cm from the nipple demonstrates fibrocystic changes. Imaging and pathology findings are concordant. No follow-up necessary in this region. 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 The findings are concordant. Recommend continued oncologic follow up. The patient also had a mass at 10 o'clock, 5 cm from the nipple which was not biopsied. Recommend a six-month follow-up ultrasound to ensure stability or resolution. These findings will be called to the patient today. Electronically Signed   By: Dorise Bullion III M.D   On: 01/08/2018 09:38   Result Date: 01/08/2018 CLINICAL DATA:  Patient presents for ultrasound-guided core needle biopsy of a 8 o'clock position retroareolar right breast mass, initially found by palpation. A smaller adjacent 10 o'clock position, 3 cm the nipple, mass was also discovered on diagnostic imaging, also  to undergo ultrasound-guided core needle biopsy. In addition, patient had 6 enlarged abnormal right axillary lymph nodes, 1 of which will be biopsied under ultrasound guidance. Patient carries the presumptive diagnosis of CLL. EXAM: ULTRASOUND GUIDED RIGHT BREAST CORE NEEDLE BIOPSY: Lesion #1 ULTRASOUND GUIDED RIGHT BREAST CORE NEEDLE BIOPSY: Lesion #2 ULTRASOUND GUIDED RIGHT AXILLARY LYMPH NODE CORE NEEDLE BIOPSY: Lesion #3 COMPARISON:  Previous exam(s). FINDINGS: I met with the patient and we discussed the procedure of ultrasound-guided biopsy, including benefits and alternatives. We discussed the high likelihood of a successful procedure. We discussed the risks of the procedure, including infection, bleeding, tissue injury, clip migration, and inadequate sampling. Informed written consent was given. The usual time-out protocol was performed immediately prior to the procedure. Lesion #1: 8 o'clock retroareolar. Lesion quadrant: Lower inner quadrant Using sterile technique and 1% Lidocaine as local anesthetic, under direct ultrasound visualization, a 12 gauge spring-loaded device was used to perform biopsy of the retroareolar 1.3 cm mass using a lateral approach. At the conclusion of the procedure a coil shaped tissue marker clip was deployed into the biopsy cavity. Lesion #2: 10 o'clock, 3 cm from the nipple. Lesion quadrant: Upper outer quadrant Using sterile technique and 1% Lidocaine as local anesthetic, under direct ultrasound visualization, a 12 gauge spring-loaded device was used to perform biopsy of the 10 o'clock position mass  using an inferior approach. At the conclusion of the procedure a ribbon shaped tissue marker clip was deployed into the biopsy cavity. Lesion #3: Abnormal right axillary lymph node. Using sterile technique and 1% Lidocaine as local anesthetic, under direct ultrasound visualization, a 14 gauge spring-loaded device was used to perform biopsy of 1 of the abnormal right axillary lymph  nodes using an inferior approach. At the conclusion of the procedure a HydroMARK tissue marker clip was deployed into the biopsy cavity. Follow up 2 view mammogram was performed and dictated separately. IMPRESSION: Ultrasound guided biopsy of 3 lesions, 2 in the right breast and 1 abnormal right axillary lymph node. No apparent complications. Electronically Signed: By: Lajean Manes M.D. On: 01/05/2018 09:34   [place] Rt Breast Bx W Loc Dev Ea Add Lesion Img Bx Spec US Guide  Addendum Date: 01/08/2018   ADDENDUM REPORT: 01/08/2018 09:38 ADDENDUM: The 8 o'clock subareolar mass biopsy demonstrates no atypia or malignancy. 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 The findings are concordant. Recommend surgical consultation for management. I favor granulomatous mastitis over infection given the lack of acute symptoms prior to the biopsy. The mass at 10 o'clock, 3 cm from the nipple demonstrates fibrocystic changes. Imaging and pathology findings are concordant. No follow-up necessary in this region. 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 The findings are concordant. Recommend continued oncologic follow up. The patient also had a mass at 10 o'clock, 5 cm from the nipple which was not biopsied. Recommend a six-month follow-up ultrasound to ensure stability or resolution. These findings will be called to the patient today. Electronically Signed   By: Dorise Bullion III M.D   On: 01/08/2018 09:38   Result Date: 01/08/2018 CLINICAL DATA:  Patient presents for ultrasound-guided core needle biopsy of a 8 o'clock position retroareolar right breast mass, initially found by palpation. A smaller adjacent 10 o'clock position, 3 cm the nipple, mass was also discovered on diagnostic imaging, also to undergo ultrasound-guided core needle biopsy. In addition, patient had 6 enlarged abnormal right axillary lymph nodes, 1 of  which will be biopsied under ultrasound guidance. Patient carries the presumptive diagnosis of CLL. EXAM: ULTRASOUND GUIDED RIGHT BREAST CORE NEEDLE BIOPSY: Lesion #1 ULTRASOUND GUIDED RIGHT BREAST CORE NEEDLE BIOPSY: Lesion #2 ULTRASOUND GUIDED RIGHT AXILLARY LYMPH NODE CORE NEEDLE BIOPSY: Lesion #3 COMPARISON:  Previous exam(s). FINDINGS: I met with the patient and we discussed the procedure of ultrasound-guided biopsy, including benefits and alternatives. We discussed the high likelihood of a successful procedure. We discussed the risks of the procedure, including infection, bleeding, tissue injury, clip migration, and inadequate sampling. Informed written consent was given. The usual time-out protocol was performed immediately prior to the procedure. Lesion #1: 8 o'clock retroareolar. Lesion quadrant: Lower inner quadrant Using sterile technique and 1% Lidocaine as local anesthetic, under direct ultrasound visualization, a 12 gauge spring-loaded device was used to perform biopsy of the retroareolar 1.3 cm mass using a lateral approach. At the conclusion of the procedure a coil shaped tissue marker clip was deployed into the biopsy cavity. Lesion #2: 10 o'clock, 3 cm from the nipple. Lesion quadrant: Upper outer quadrant Using sterile technique and 1% Lidocaine as local anesthetic, under direct ultrasound visualization, a 12 gauge spring-loaded device was used to perform biopsy of the 10 o'clock position mass using an inferior approach. At the conclusion of the procedure a ribbon shaped tissue marker clip was deployed into the biopsy cavity.  Lesion #3: Abnormal right axillary lymph node. Using sterile technique and 1% Lidocaine as local anesthetic, under direct ultrasound visualization, a 14 gauge spring-loaded device was used to perform biopsy of 1 of the abnormal right axillary lymph nodes using an inferior approach. At the conclusion of the procedure a HydroMARK tissue marker clip was deployed into the biopsy  cavity. Follow up 2 view mammogram was performed and dictated separately. IMPRESSION: Ultrasound guided biopsy of 3 lesions, 2 in the right breast and 1 abnormal right axillary lymph node. No apparent complications. Electronically Signed: By: Lajean Manes M.D. On: 01/05/2018 09:34    ASSESSMENT: CLL, Rai stage 1, breast mass  PLAN:    1. CLL: Confirmed by peripheral blood flow cytometry.  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  CT scans from May second 2019 reviewed independently and report as above confirming progression of disease.  Proceed with cycle 1, day 1 of Rituxan and Treanda today.  Return to clinic tomorrow for Treanda only, in 1 week for laboratory work and further evaluation, and then in 4 weeks for consideration of cycle 2, day 1.   2. Lymphadenopathy: Secondary CLL.  Treatment as above 3.  Leukocytosis: Secondary to CLL.  Treatment as above. 4.  Breast mass: Pathology results reviewed independently reporting benign disease.  Monitor.  Approximately 30 minutes was spent in discussion of which greater than 50% was consultation.  Patient expressed understanding and was in agreement with this plan. She also understands that She can call clinic at any time with any questions, concerns, or complaints.    Lloyd Huger, MD   01/26/2018 11:02 PM  Addendum: Patient unable to get treatment today secondary to insurance approval.  Once this is obtained, will call patient back to initiate cycle 1.

## 2018-01-27 ENCOUNTER — Other Ambulatory Visit: Payer: Self-pay | Admitting: Oncology

## 2018-01-28 ENCOUNTER — Inpatient Hospital Stay: Payer: BLUE CROSS/BLUE SHIELD

## 2018-01-28 ENCOUNTER — Inpatient Hospital Stay: Payer: BLUE CROSS/BLUE SHIELD | Admitting: Oncology

## 2018-01-28 ENCOUNTER — Inpatient Hospital Stay (HOSPITAL_BASED_OUTPATIENT_CLINIC_OR_DEPARTMENT_OTHER): Payer: BLUE CROSS/BLUE SHIELD | Admitting: Oncology

## 2018-01-28 VITALS — BP 100/61 | HR 71 | Temp 97.8°F | Resp 18 | Wt 153.0 lb

## 2018-01-28 DIAGNOSIS — Z720 Tobacco use: Secondary | ICD-10-CM | POA: Diagnosis not present

## 2018-01-28 DIAGNOSIS — N63 Unspecified lump in unspecified breast: Secondary | ICD-10-CM | POA: Diagnosis not present

## 2018-01-28 DIAGNOSIS — C9110 Chronic lymphocytic leukemia of B-cell type not having achieved remission: Secondary | ICD-10-CM

## 2018-01-28 DIAGNOSIS — F429 Obsessive-compulsive disorder, unspecified: Secondary | ICD-10-CM | POA: Diagnosis not present

## 2018-01-28 DIAGNOSIS — J40 Bronchitis, not specified as acute or chronic: Secondary | ICD-10-CM | POA: Diagnosis not present

## 2018-01-28 DIAGNOSIS — Z5112 Encounter for antineoplastic immunotherapy: Secondary | ICD-10-CM | POA: Diagnosis not present

## 2018-01-28 DIAGNOSIS — Z5111 Encounter for antineoplastic chemotherapy: Secondary | ICD-10-CM | POA: Diagnosis not present

## 2018-01-28 DIAGNOSIS — R05 Cough: Secondary | ICD-10-CM | POA: Diagnosis not present

## 2018-01-28 DIAGNOSIS — R599 Enlarged lymph nodes, unspecified: Secondary | ICD-10-CM | POA: Diagnosis not present

## 2018-01-28 DIAGNOSIS — J040 Acute laryngitis: Secondary | ICD-10-CM | POA: Diagnosis not present

## 2018-01-28 DIAGNOSIS — F329 Major depressive disorder, single episode, unspecified: Secondary | ICD-10-CM | POA: Diagnosis not present

## 2018-01-28 DIAGNOSIS — M545 Low back pain: Secondary | ICD-10-CM | POA: Diagnosis not present

## 2018-01-28 DIAGNOSIS — J439 Emphysema, unspecified: Secondary | ICD-10-CM | POA: Diagnosis not present

## 2018-01-28 DIAGNOSIS — J449 Chronic obstructive pulmonary disease, unspecified: Secondary | ICD-10-CM | POA: Diagnosis not present

## 2018-01-28 DIAGNOSIS — R062 Wheezing: Secondary | ICD-10-CM | POA: Diagnosis not present

## 2018-01-28 DIAGNOSIS — D72829 Elevated white blood cell count, unspecified: Secondary | ICD-10-CM | POA: Diagnosis not present

## 2018-01-28 LAB — COMPREHENSIVE METABOLIC PANEL
ALBUMIN: 3.7 g/dL (ref 3.5–5.0)
ALK PHOS: 56 U/L (ref 38–126)
ALT: 18 U/L (ref 14–54)
AST: 25 U/L (ref 15–41)
Anion gap: 7 (ref 5–15)
BILIRUBIN TOTAL: 0.5 mg/dL (ref 0.3–1.2)
BUN: 15 mg/dL (ref 6–20)
CALCIUM: 8.6 mg/dL — AB (ref 8.9–10.3)
CO2: 25 mmol/L (ref 22–32)
Chloride: 105 mmol/L (ref 101–111)
Creatinine, Ser: 0.79 mg/dL (ref 0.44–1.00)
GFR calc Af Amer: >60 mL/min (ref >60)
GFR calc non Af Amer: >60 mL/min (ref >60)
GLUCOSE: 134 mg/dL — AB (ref 65–99)
Potassium: 4.3 mmol/L (ref 3.5–5.1)
SODIUM: 137 mmol/L (ref 135–145)
TOTAL PROTEIN: 6.2 g/dL — AB (ref 6.5–8.1)

## 2018-01-28 LAB — CBC WITH DIFFERENTIAL/PLATELET
BASOS ABS: 0.1 10*3/uL (ref 0–0.1)
BASOS PCT: 0 %
EOS PCT: 0 %
Eosinophils Absolute: 0.1 10*3/uL (ref 0–0.7)
HEMATOCRIT: 38 % (ref 35.0–47.0)
Hemoglobin: 12.8 g/dL (ref 12.0–16.0)
LYMPHS PCT: 87 %
Lymphs Abs: 36.7 10*3/uL — ABNORMAL HIGH (ref 1.0–3.6)
MCH: 33.1 pg (ref 26.0–34.0)
MCHC: 33.6 g/dL (ref 32.0–36.0)
MCV: 98.6 fL (ref 80.0–100.0)
MONO ABS: 1 10*3/uL — AB (ref 0.2–0.9)
MONOS PCT: 3 %
NEUTROS ABS: 4.3 10*3/uL (ref 1.4–6.5)
Neutrophils Relative %: 10 %
PLATELETS: 190 10*3/uL (ref 150–440)
RBC: 3.85 MIL/uL (ref 3.80–5.20)
RDW: 13.5 % (ref 11.5–14.5)
WBC: 42.3 10*3/uL — ABNORMAL HIGH (ref 3.6–11.0)

## 2018-01-28 MED ORDER — DEXAMETHASONE SODIUM PHOSPHATE 10 MG/ML IJ SOLN
10.0000 mg | Freq: Once | INTRAMUSCULAR | Status: AC
  Administered 2018-01-28: 10 mg via INTRAVENOUS
  Filled 2018-01-28: qty 1

## 2018-01-28 MED ORDER — PALONOSETRON HCL INJECTION 0.25 MG/5ML
0.2500 mg | Freq: Once | INTRAVENOUS | Status: AC
Start: 2018-01-28 — End: 2018-01-28
  Administered 2018-01-28: 0.25 mg via INTRAVENOUS

## 2018-01-28 MED ORDER — ACETAMINOPHEN 325 MG PO TABS
650.0000 mg | ORAL_TABLET | Freq: Once | ORAL | Status: AC
  Administered 2018-01-28: 650 mg via ORAL
  Filled 2018-01-28: qty 2

## 2018-01-28 MED ORDER — DIPHENHYDRAMINE HCL 50 MG/ML IJ SOLN
25.0000 mg | Freq: Once | INTRAMUSCULAR | Status: AC | PRN
  Administered 2018-01-28: 25 mg via INTRAVENOUS

## 2018-01-28 MED ORDER — RITUXIMAB CHEMO INJECTION 100 MG/10ML
375.0000 mg/m2 | Freq: Once | INTRAVENOUS | Status: AC
  Administered 2018-01-28: 700 mg via INTRAVENOUS
  Filled 2018-01-28: qty 70

## 2018-01-28 MED ORDER — SODIUM CHLORIDE 0.9 % IV SOLN: 10.0000 mg | Freq: Once | INTRAVENOUS | Status: DC

## 2018-01-28 MED ORDER — DIPHENHYDRAMINE HCL 25 MG PO CAPS
25.0000 mg | ORAL_CAPSULE | Freq: Once | ORAL | Status: AC
  Administered 2018-01-28: 25 mg via ORAL
  Filled 2018-01-28: qty 1

## 2018-01-28 MED ORDER — SODIUM CHLORIDE 0.9 % IV SOLN
90.0000 mg/m2 | Freq: Once | INTRAVENOUS | Status: AC
  Administered 2018-01-28: 150 mg via INTRAVENOUS
  Filled 2018-01-28: qty 6

## 2018-01-28 MED ORDER — SODIUM CHLORIDE 0.9 % IV SOLN
Freq: Once | INTRAVENOUS | Status: AC
  Administered 2018-01-28: 09:00:00 via INTRAVENOUS
  Filled 2018-01-28: qty 1000

## 2018-01-28 NOTE — Progress Notes (Signed)
Patient started itching and had rash on face and neck.  No problems breathing.  25mg  IV Benadryl given and Rituxan paused for 15 minutes.  Patient stated that she had done this in the past.  [name] and his team were notified.

## 2018-01-29 ENCOUNTER — Inpatient Hospital Stay: Payer: BLUE CROSS/BLUE SHIELD

## 2018-01-29 VITALS — BP 109/70 | HR 78 | Temp 99.0°F | Resp 18 | Wt 153.0 lb

## 2018-01-29 DIAGNOSIS — J439 Emphysema, unspecified: Secondary | ICD-10-CM | POA: Diagnosis not present

## 2018-01-29 DIAGNOSIS — F429 Obsessive-compulsive disorder, unspecified: Secondary | ICD-10-CM | POA: Diagnosis not present

## 2018-01-29 DIAGNOSIS — C9110 Chronic lymphocytic leukemia of B-cell type not having achieved remission: Secondary | ICD-10-CM

## 2018-01-29 DIAGNOSIS — J40 Bronchitis, not specified as acute or chronic: Secondary | ICD-10-CM | POA: Diagnosis not present

## 2018-01-29 DIAGNOSIS — N63 Unspecified lump in unspecified breast: Secondary | ICD-10-CM | POA: Diagnosis not present

## 2018-01-29 DIAGNOSIS — J040 Acute laryngitis: Secondary | ICD-10-CM | POA: Diagnosis not present

## 2018-01-29 DIAGNOSIS — R599 Enlarged lymph nodes, unspecified: Secondary | ICD-10-CM | POA: Diagnosis not present

## 2018-01-29 DIAGNOSIS — Z5112 Encounter for antineoplastic immunotherapy: Secondary | ICD-10-CM | POA: Diagnosis not present

## 2018-01-29 DIAGNOSIS — Z5111 Encounter for antineoplastic chemotherapy: Secondary | ICD-10-CM | POA: Diagnosis not present

## 2018-01-29 DIAGNOSIS — F329 Major depressive disorder, single episode, unspecified: Secondary | ICD-10-CM | POA: Diagnosis not present

## 2018-01-29 DIAGNOSIS — Z720 Tobacco use: Secondary | ICD-10-CM | POA: Diagnosis not present

## 2018-01-29 DIAGNOSIS — D72829 Elevated white blood cell count, unspecified: Secondary | ICD-10-CM | POA: Diagnosis not present

## 2018-01-29 DIAGNOSIS — R062 Wheezing: Secondary | ICD-10-CM | POA: Diagnosis not present

## 2018-01-29 DIAGNOSIS — M545 Low back pain: Secondary | ICD-10-CM | POA: Diagnosis not present

## 2018-01-29 DIAGNOSIS — J449 Chronic obstructive pulmonary disease, unspecified: Secondary | ICD-10-CM | POA: Diagnosis not present

## 2018-01-29 DIAGNOSIS — R05 Cough: Secondary | ICD-10-CM | POA: Diagnosis not present

## 2018-01-29 MED ORDER — SODIUM CHLORIDE 0.9 % IV SOLN
Freq: Once | INTRAVENOUS | Status: AC
Start: 2018-01-29 — End: 2018-01-29
  Administered 2018-01-29: 14:00:00 via INTRAVENOUS
  Filled 2018-01-29: qty 1000

## 2018-01-29 MED ORDER — SODIUM CHLORIDE 0.9 % IV SOLN: 10.0000 mg | Freq: Once | INTRAVENOUS | Status: DC

## 2018-01-29 MED ORDER — BENDAMUSTINE HCL CHEMO INJECTION 100 MG/4ML
90.0000 mg/m2 | Freq: Once | INTRAVENOUS | Status: AC
  Administered 2018-01-29: 150 mg via INTRAVENOUS
  Filled 2018-01-29: qty 6

## 2018-01-29 MED ORDER — DEXAMETHASONE SODIUM PHOSPHATE 10 MG/ML IJ SOLN
10.0000 mg | Freq: Once | INTRAMUSCULAR | Status: AC
  Administered 2018-01-29: 10 mg via INTRAVENOUS
  Filled 2018-01-29: qty 1

## 2018-01-29 NOTE — Progress Notes (Signed)
[place]  [phone] [phone]  ID: Monique [name] OB: 11-29-1970  MR#: [id number]  ALP#:[id number]  Patient Care Team: [name], MD as PCP - General (Family Medicine)  CHIEF COMPLAINT: CLL.  INTERVAL HISTORY: Patient returns to clinic today for further evaluation and reattempt at cycle 1, day 1 of Rituxan and Treanda.  Treatment was delayed 1 week secondary to insurance approval of treatment plan.  She continues to be anxious, but otherwise feels well.  She complains of bilateral "knots" on her elbow.  She also feels her axillary lymphadenopathy is getting worse.  She has no neurologic complaints. She denies any fevers, illnesses, or weight loss. She has no chest pain or shortness of breath. She denies any nausea, vomiting, constipation, or diarrhea. She has no urinary complaints.  Patient offers no further specific complaints today.  REVIEW OF SYSTEMS:   Review of Systems  Constitutional: Negative.  Negative for fever, malaise/fatigue and weight loss.  HENT: Negative.  Negative for congestion.   Respiratory: Negative for cough and shortness of breath.   Cardiovascular: Negative.  Negative for chest pain and leg swelling.  Gastrointestinal: Negative.  Negative for abdominal pain and constipation.  Genitourinary: Negative.  Negative for dysuria.  Musculoskeletal: Positive for neck pain. Negative for myalgias.  Skin: Negative.  Negative for rash.  Neurological: Negative for tingling, sensory change, focal weakness and weakness.  Psychiatric/Behavioral: The patient is nervous/anxious.     As per HPI. Otherwise, a complete review of systems is negative.  PAST MEDICAL HISTORY: Past Medical History:  Diagnosis Date  . Anxiety   . Cervical cancer ([place])    hx LEEP over 18 years ago.   . Cervical intraepithelial neoplasia I   . 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)   . Depression   . Eating disorder   . History of self-harm   .  Insomnia   . Obsession   . Pap smear abnormality of cervix with LGSIL   . Tobacco abuse   . Vitamin B12 deficiency (non anemic)     PAST SURGICAL HISTORY: Past Surgical History:  Procedure Laterality Date  . BREAST BIOPSY Right 01/05/2018   US guided biopsy of 2 areas and 1 lymph node  . CERVICAL BIOPSY  W/ LOOP ELECTRODE EXCISION    . TUBAL LIGATION      FAMILY HISTORY: Family History  Problem Relation Age of Onset  . Cancer Mother        thyroid  . Alcohol abuse Father   . Depression Sister   . Cancer Sister        cevical  . Alcohol abuse Brother   . Depression Brother   . Bipolar disorder Brother   . ADD / ADHD Son   . Breast cancer Neg Hx     ADVANCED DIRECTIVES (Y/N):  N  HEALTH MAINTENANCE: Social History   Tobacco Use  . Smoking status: Current Every Day Smoker    Packs/day: 1.00    Years: 31.00    Pack years: 31.00    Start date: 09/25/1986  . Smokeless tobacco: Never Used  . Tobacco comment: Would like to discuss Chantix- she forgot to take this medication  Substance Use Topics  . Alcohol use: Yes    Alcohol/week: 0.0 oz    Comment: rarely  . Drug use: No     Colonoscopy:  PAP:  Bone density:  Lipid panel:  No Known Allergies  Current Outpatient Medications  Medication Sig Dispense Refill  . carbamazepine (TEGRETOL)  200 MG tablet TAKE 1 TABLET (200 MG TOTAL) BY MOUTH 3 (THREE) TIMES DAILY. 90 tablet 0  . escitalopram (LEXAPRO) 10 MG tablet     . gabapentin (NEURONTIN) 100 MG capsule Take 100 mg twice a day for one week, then increase to 200 mg(2 tablets) twice a day and continue    . ondansetron (ZOFRAN) 8 MG tablet Take 1 tablet (8 mg total) by mouth 2 (two) times daily as needed for refractory nausea / vomiting. (Patient not taking: Reported on 01/21/2018) 30 tablet 2  . prochlorperazine (COMPAZINE) 10 MG tablet Take 1 tablet (10 mg total) by mouth every 6 (six) hours as needed (Nausea or vomiting). (Patient not taking: Reported on 01/21/2018)  60 tablet 2  . traMADol (ULTRAM) 50 MG tablet Take by mouth every 6 (six) hours as needed.    . umeclidinium-vilanterol (ANORO ELLIPTA) 62.5-25 MCG/INH AEPB TAKE 1 PUFF BY MOUTH EVERY DAY 60 each 5   No current facility-administered medications for this visit.    Facility-Administered Medications Ordered in Other Visits  Medication Dose Route Frequency Provider Last Rate Last Dose  . bendamustine (BENDEKA) 150 mg in sodium chloride 0.9 % 50 mL (2.6786 mg/mL) chemo infusion  90 mg/m2 (Treatment Plan Recorded) Intravenous Once Lloyd Huger, MD        OBJECTIVE: There were no vitals filed for this visit.   There is no height or weight on file to calculate BMI.    ECOG FS:0 - Asymptomatic  General: Well-developed, well-nourished, no acute distress. Eyes: Pink conjunctiva, anicteric sclera. HEENT: Normocephalic, moist mucous membranes, clear oropharnyx. Lungs: Clear to auscultation bilaterally. Heart: Regular rate and rhythm. No rubs, murmurs, or gallops. Abdomen: Soft, nontender, nondistended. No organomegaly noted, normoactive bowel sounds. Musculoskeletal: No edema, cyanosis, or clubbing. Neuro: Alert, answering all questions appropriately. Cranial nerves grossly intact. Skin: No rashes or petechiae noted. Psych: Normal affect. Lymphatics: Palpable cervical and axillary adenopathy.  No palpable nodules on patient's elbows.  LAB RESULTS:  Lab Results  Component Value Date   NA 137 01/28/2018   K 4.3 01/28/2018   CL 105 01/28/2018   CO2 25 01/28/2018   GLUCOSE 134 (H) 01/28/2018   BUN 15 01/28/2018   CREATININE 0.79 01/28/2018   CALCIUM 8.6 (L) 01/28/2018   PROT 6.2 (L) 01/28/2018   ALBUMIN 3.7 01/28/2018   AST 25 01/28/2018   ALT 18 01/28/2018   ALKPHOS 56 01/28/2018   BILITOT 0.5 01/28/2018   GFRNONAA >60 01/28/2018   GFRAA >60 01/28/2018    Lab Results  Component Value Date   WBC 42.3 (H) 01/28/2018   NEUTROABS 4.3 01/28/2018   HGB 12.8 01/28/2018   HCT 38.0  01/28/2018   MCV 98.6 01/28/2018   PLT 190 01/28/2018     STUDIES: Ct Soft Tissue Neck W Contrast  Result Date: 01/15/2018 CLINICAL DATA:  47 year old female with chronic lymphocytic leukemia. Restaging. EXAM: CT NECK WITH CONTRAST TECHNIQUE: Multidetector CT imaging of the neck was performed using the standard protocol following the bolus administration of intravenous contrast. CONTRAST:  11m ISOVUE-300 IOPAMIDOL (ISOVUE-300) INJECTION 61% in conjunction with contrast enhanced imaging of the chest, abdomen, and pelvis reported separately. COMPARISON:  Brain MRI 01/01/2018.  Neck CT 10/01/2017 and earlier. FINDINGS: Pharynx and larynx: Glottis is closed as before. Laryngeal and pharyngeal soft tissue contours remain normal. Negative parapharyngeal and retropharyngeal spaces. Salivary glands: The sublingual space, submandibular glands, and parotid glands are stable and within normal limits. Thyroid: Stable and normal. Lymph nodes: Generalized cervical  and upper thoracic lymphadenopathy. The largest nodes in the neck bilaterally are generally 1 millimeter increased compared to the January CT. For example, the largest left level 2 B lymph node on series 7, image 42 measures 11 millimeter short axis and was 8-9 millimeters previously. None of the nodal stations demonstrate decreased lymph node size or number. No cystic or necrotic nodes. Vascular: Major vascular structures in the neck and at the skull base remain patent. Limited intracranial: Negative. Visualized orbits: Negative. Mastoids and visualized paranasal sinuses: Clear. Skeleton: Negative. Upper chest: Reported separately today. IMPRESSION: 1. Stable to mild progression of CLL since January. Diffuse cervical and upper thoracic lymphadenopathy with many of the largest nodes increased in size by about 1 mm since 10/01/2017. 2.  CT Chest, Abdomen, and Pelvis today are reported separately. Electronically Signed   By: Genevie Ann M.D.   On: 01/15/2018 13:41     Ct Chest W Contrast  Result Date: 01/15/2018 CLINICAL DATA:  Follow up of chronic lymphocytic leukemia. Restaging. EXAM: CT CHEST, ABDOMEN, AND PELVIS WITH CONTRAST TECHNIQUE: Multidetector CT imaging of the chest, abdomen and pelvis was performed following the standard protocol during bolus administration of intravenous contrast. CONTRAST:  143m ISOVUE-300 IOPAMIDOL (ISOVUE-300) INJECTION 61% COMPARISON:  CT scan 10/01/2017 FINDINGS: CT CHEST FINDINGS Cardiovascular: Unremarkable Mediastinum/Nodes: Notable supraclavicular, axillary, subpectoral, paratracheal, bilateral hilar, right infrahilar, and subcarinal adenopathy. An index left axillary lymph node measures 2.0 cm in short axis on image 16/3, previously 1.7 cm. An index subpectoral lymph node measures 2.1 cm in short axis on image 10/3, previously 1.6 cm. Index right paratracheal node 1.4 cm in short axis on image 18/3, previously 1.1 cm. Subcarinal node 1.7 cm in short axis on image 25/3, formerly the same. Lungs/Pleura: Centrilobular emphysema. 5 by 4 mm right upper lobe subpleural nodule on image 36/5, stable compared to earliest available comparison of 01/21/2017. Mild subpleural nodularity along the minor fissure including a 0.6 by 0.5 cm nodule on image 74/5, stable. Additional small nodules in the right middle lobe and left lower lobe are stable compared to 01/21/2017. Musculoskeletal: Unremarkable CT ABDOMEN PELVIS FINDINGS Hepatobiliary: Unremarkable Pancreas: Unremarkable Spleen: There is a flat mass of soft tissue along the splenic hilum measuring 5.0 by 1.4 cm on image 56/3, previously 4.1 by 1.3 cm, and likely representing conglomerate adenopathy. This is hypodense relative to the splenic parenchyma and is not thought to represent an accessory spleen. Adrenals/Urinary Tract: Adrenal glands and kidneys appear normal. Displacement of the left ureter by the retroperitoneal mass. Stomach/Bowel: Prominent stool throughout the colon favors  constipation. 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 There is some mild atherosclerotic calcification at the aortic bifurcation. Extensive adenopathy in the right gastric chain, porta hepatis and peripancreatic region, celiac and upper mesenteric region, retroperitoneum, omental, central mesentery, and along the iliac chains. A dominant left periaortic mass or node measures 4.8 cm in short axis, previously by my measurement 4.5 cm. A portacaval node measures 2.5 cm in short axis on image 64/3, formerly 2.3 cm. A left external iliac lymph node measures 3.2 cm in short axis on image 114/3, previously 2.9 cm. Reproductive: Unremarkable Other: No supplemental non-categorized findings. Musculoskeletal: Unremarkable IMPRESSION: 1. Progressive bulky adenopathy in the chest, abdomen, and pelvis, compatible with mild worsening of disease. 2. 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 3. Aortic Atherosclerosis (ICD10-I70.0) and Emphysema (ICD10-J43.9). 4. Stable small lung nodules, unchanged from earliest available comparison  of 01/21/2017. Continued surveillance likely warranted. 5.  Prominent stool throughout the colon favors constipation. Electronically Signed   By: Van Clines M.D.   On: 01/15/2018 14:41   Mr Jeri Cos EB Contrast  Result Date: 01/01/2018 CLINICAL DATA:  47 y/o F; history of chronic lymphocytic leukemia. Posterior headache with sharp pains, left leg weakness, and right facial pain with numbness for 1 month. EXAM: MRI HEAD WITHOUT AND WITH CONTRAST TECHNIQUE: Multiplanar, multiecho pulse sequences of the brain and surrounding structures were obtained without and with intravenous contrast. CONTRAST:  67m MULTIHANCE GADOBENATE DIMEGLUMINE 529 MG/ML IV SOLN COMPARISON:  10/01/2017 CT of the neck. FINDINGS: Brain: No acute infarction, hemorrhage, hydrocephalus,  extra-axial collection or mass lesion. No abnormal enhancement. Vascular: Normal flow voids. Skull and upper cervical spine: Normal marrow signal. Sinuses/Orbits: Negative. Other: Left suboccipital scalp 12 mm low signal focus with calcifications on CT neck, likely sebaceous cyst. Partially visualized prominent upper cervical lymph nodes compatible with history of CLL. IMPRESSION: 1. No acute intracranial abnormality.  Normal MRI of the brain. 2. Enlarged upper cervical lymph nodes, partially visualized, compatible with history of CLL. Electronically Signed   By: LKristine GarbeM.D.   On: 01/01/2018 13:52   Ct Abdomen Pelvis W Contrast  Result Date: 01/15/2018 CLINICAL DATA:  Follow up of chronic lymphocytic leukemia. Restaging. EXAM: CT CHEST, ABDOMEN, AND PELVIS WITH CONTRAST TECHNIQUE: Multidetector CT imaging of the chest, abdomen and pelvis was performed following the standard protocol during bolus administration of intravenous contrast. CONTRAST:  1098mISOVUE-300 IOPAMIDOL (ISOVUE-300) INJECTION 61% COMPARISON:  CT scan 10/01/2017 FINDINGS: CT CHEST FINDINGS Cardiovascular: Unremarkable Mediastinum/Nodes: Notable supraclavicular, axillary, subpectoral, paratracheal, bilateral hilar, right infrahilar, and subcarinal adenopathy. An index left axillary lymph node measures 2.0 cm in short axis on image 16/3, previously 1.7 cm. An index subpectoral lymph node measures 2.1 cm in short axis on image 10/3, previously 1.6 cm. Index right paratracheal node 1.4 cm in short axis on image 18/3, previously 1.1 cm. Subcarinal node 1.7 cm in short axis on image 25/3, formerly the same. Lungs/Pleura: Centrilobular emphysema. 5 by 4 mm right upper lobe subpleural nodule on image 36/5, stable compared to earliest available comparison of 01/21/2017. Mild subpleural nodularity along the minor fissure including a 0.6 by 0.5 cm nodule on image 74/5, stable. Additional small nodules in the right middle lobe and left  lower lobe are stable compared to 01/21/2017. Musculoskeletal: Unremarkable CT ABDOMEN PELVIS FINDINGS Hepatobiliary: Unremarkable Pancreas: Unremarkable Spleen: There is a flat mass of soft tissue along the splenic hilum measuring 5.0 by 1.4 cm on image 56/3, previously 4.1 by 1.3 cm, and likely representing conglomerate adenopathy. This is hypodense relative to the splenic parenchyma and is not thought to represent an accessory spleen. Adrenals/Urinary Tract: Adrenal glands and kidneys appear normal. Displacement of the left ureter by the retroperitoneal mass. Stomach/Bowel: Prominent stool throughout the colon favors constipation. 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 There is some mild atherosclerotic calcification at the aortic bifurcation. Extensive adenopathy in the right gastric chain, porta hepatis and peripancreatic region, celiac and upper mesenteric region, retroperitoneum, omental, central mesentery, and along the iliac chains. A dominant left periaortic mass or node measures 4.8 cm in short axis, previously by my measurement 4.5 cm. A portacaval node measures 2.5 cm in short axis on image 64/3, formerly 2.3 cm. A left external iliac lymph node measures 3.2 cm in short axis on image 114/3, previously 2.9  cm. Reproductive: Unremarkable Other: No supplemental non-categorized findings. Musculoskeletal: Unremarkable IMPRESSION: 1. Progressive bulky adenopathy in the chest, abdomen, and pelvis, compatible with mild worsening of disease. 2. 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 3. Aortic Atherosclerosis (ICD10-I70.0) and Emphysema (ICD10-J43.9). 4. Stable small lung nodules, unchanged from earliest available comparison of 01/21/2017. Continued surveillance likely warranted. 5.  Prominent stool throughout the colon favors constipation. Electronically Signed   By: Van Clines M.D.   On: 01/15/2018 14:41   Mm Clip Placement Right  Result Date: 01/05/2018 CLINICAL DATA:  Patient is status post ultrasound-guided core needle biopsy of 8 o'clock position retroareolar right breast mass, a 10 o'clock positions smaller right breast mass and an abnormal right axillary lymph node. EXAM: DIAGNOSTIC RIGHT MAMMOGRAM POST ULTRASOUND BIOPSY COMPARISON:  Previous exam(s). FINDINGS: Mammographic images were obtained following ultrasound guided biopsy of 2 right breast masses and an abnormal right axillary lymph node. The coil shaped biopsy clip lies within the retroareolar breast mass. The heart shaped biopsy clip lies in the expected location of the smaller, 10 o'clock position mass. The [facility] biopsy clip use for the right axillary lymph node is not visualized on the included field of view. IMPRESSION: Well-positioned biopsy clips in the right breast following ultrasound-guided core needle biopsy 2 right breast masses. The axillary lymph node HydroMARK biopsy clip is not visualized mammographically. Final Assessment: Post Procedure Mammograms for Marker Placement Electronically Signed   By: Lajean Manes M.D.   On: 01/05/2018 09:39   [place] Rt Breast Bx W Loc Dev 1st Lesion Img Bx Spec US Guide  Addendum Date: 01/08/2018   ADDENDUM REPORT: 01/08/2018 09:38 ADDENDUM: The 8 o'clock subareolar mass biopsy demonstrates no atypia or malignancy. 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 The findings are concordant. Recommend surgical consultation for management. I favor granulomatous mastitis over infection given the lack of acute symptoms prior to the biopsy. The mass at 10 o'clock, 3 cm from the nipple demonstrates fibrocystic changes. Imaging and pathology findings are concordant. No follow-up necessary in this region. 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 The findings are  concordant. Recommend continued oncologic follow up. The patient also had a mass at 10 o'clock, 5 cm from the nipple which was not biopsied. Recommend a six-month follow-up ultrasound to ensure stability or resolution. These findings will be called to the patient today. Electronically Signed   By: Dorise Bullion III M.D   On: 01/08/2018 09:38   Result Date: 01/08/2018 CLINICAL DATA:  Patient presents for ultrasound-guided core needle biopsy of a 8 o'clock position retroareolar right breast mass, initially found by palpation. A smaller adjacent 10 o'clock position, 3 cm the nipple, mass was also discovered on diagnostic imaging, also to undergo ultrasound-guided core needle biopsy. In addition, patient had 6 enlarged abnormal right axillary lymph nodes, 1 of which will be biopsied under ultrasound guidance. Patient carries the presumptive diagnosis of CLL. EXAM: ULTRASOUND GUIDED RIGHT BREAST CORE NEEDLE BIOPSY: Lesion #1 ULTRASOUND GUIDED RIGHT BREAST CORE NEEDLE BIOPSY: Lesion #2 ULTRASOUND GUIDED RIGHT AXILLARY LYMPH NODE CORE NEEDLE BIOPSY: Lesion #3 COMPARISON:  Previous exam(s). FINDINGS: I met with the patient and we discussed the procedure of ultrasound-guided biopsy, including benefits and alternatives. We discussed the high likelihood of a successful procedure. We discussed the risks of the procedure, including infection, bleeding, tissue injury, clip migration, and inadequate sampling. Informed written consent was given. The usual time-out protocol  was performed immediately prior to the procedure. Lesion #1: 8 o'clock retroareolar. Lesion quadrant: Lower inner quadrant Using sterile technique and 1% Lidocaine as local anesthetic, under direct ultrasound visualization, a 12 gauge spring-loaded device was used to perform biopsy of the retroareolar 1.3 cm mass using a lateral approach. At the conclusion of the procedure a coil shaped tissue marker clip was deployed into the biopsy cavity. Lesion #2: 10  o'clock, 3 cm from the nipple. Lesion quadrant: Upper outer quadrant Using sterile technique and 1% Lidocaine as local anesthetic, under direct ultrasound visualization, a 12 gauge spring-loaded device was used to perform biopsy of the 10 o'clock position mass using an inferior approach. At the conclusion of the procedure a ribbon shaped tissue marker clip was deployed into the biopsy cavity. Lesion #3: Abnormal right axillary lymph node. Using sterile technique and 1% Lidocaine as local anesthetic, under direct ultrasound visualization, a 14 gauge spring-loaded device was used to perform biopsy of 1 of the abnormal right axillary lymph nodes using an inferior approach. At the conclusion of the procedure a HydroMARK tissue marker clip was deployed into the biopsy cavity. Follow up 2 view mammogram was performed and dictated separately. IMPRESSION: Ultrasound guided biopsy of 3 lesions, 2 in the right breast and 1 abnormal right axillary lymph node. No apparent complications. Electronically Signed: By: Lajean Manes M.D. On: 01/05/2018 09:34   [place] Rt Breast Bx W Loc Dev Ea Add Lesion Img Bx Spec US Guide  Addendum Date: 01/08/2018   ADDENDUM REPORT: 01/08/2018 09:38 ADDENDUM: The 8 o'clock subareolar mass biopsy demonstrates no atypia or malignancy. 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 The findings are concordant. Recommend surgical consultation for management. I favor granulomatous mastitis over infection given the lack of acute symptoms prior to the biopsy. The mass at 10 o'clock, 3 cm from the nipple demonstrates fibrocystic changes. Imaging and pathology findings are concordant. No follow-up necessary in this region. 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 The findings are concordant. Recommend continued oncologic follow up. The patient also had a mass at 10 o'clock, 5 cm from the nipple which  was not biopsied. Recommend a six-month follow-up ultrasound to ensure stability or resolution. These findings will be called to the patient today. Electronically Signed   By: Dorise Bullion III M.D   On: 01/08/2018 09:38   Result Date: 01/08/2018 CLINICAL DATA:  Patient presents for ultrasound-guided core needle biopsy of a 8 o'clock position retroareolar right breast mass, initially found by palpation. A smaller adjacent 10 o'clock position, 3 cm the nipple, mass was also discovered on diagnostic imaging, also to undergo ultrasound-guided core needle biopsy. In addition, patient had 6 enlarged abnormal right axillary lymph nodes, 1 of which will be biopsied under ultrasound guidance. Patient carries the presumptive diagnosis of CLL. EXAM: ULTRASOUND GUIDED RIGHT BREAST CORE NEEDLE BIOPSY: Lesion #1 ULTRASOUND GUIDED RIGHT BREAST CORE NEEDLE BIOPSY: Lesion #2 ULTRASOUND GUIDED RIGHT AXILLARY LYMPH NODE CORE NEEDLE BIOPSY: Lesion #3 COMPARISON:  Previous exam(s). FINDINGS: I met with the patient and we discussed the procedure of ultrasound-guided biopsy, including benefits and alternatives. We discussed the high likelihood of a successful procedure. We discussed the risks of the procedure, including infection, bleeding, tissue injury, clip migration, and inadequate sampling. Informed written consent was given. The usual time-out protocol was performed immediately prior to the procedure. Lesion #1: 8 o'clock retroareolar. Lesion quadrant: Lower inner quadrant Using sterile technique and 1%  Lidocaine as local anesthetic, under direct ultrasound visualization, a 12 gauge spring-loaded device was used to perform biopsy of the retroareolar 1.3 cm mass using a lateral approach. At the conclusion of the procedure a coil shaped tissue marker clip was deployed into the biopsy cavity. Lesion #2: 10 o'clock, 3 cm from the nipple. Lesion quadrant: Upper outer quadrant Using sterile technique and 1% Lidocaine as local  anesthetic, under direct ultrasound visualization, a 12 gauge spring-loaded device was used to perform biopsy of the 10 o'clock position mass using an inferior approach. At the conclusion of the procedure a ribbon shaped tissue marker clip was deployed into the biopsy cavity. Lesion #3: Abnormal right axillary lymph node. Using sterile technique and 1% Lidocaine as local anesthetic, under direct ultrasound visualization, a 14 gauge spring-loaded device was used to perform biopsy of 1 of the abnormal right axillary lymph nodes using an inferior approach. At the conclusion of the procedure a HydroMARK tissue marker clip was deployed into the biopsy cavity. Follow up 2 view mammogram was performed and dictated separately. IMPRESSION: Ultrasound guided biopsy of 3 lesions, 2 in the right breast and 1 abnormal right axillary lymph node. No apparent complications. Electronically Signed: By: Lajean Manes M.D. On: 01/05/2018 09:34   [place] Rt Breast Bx W Loc Dev Ea Add Lesion Img Bx Spec US Guide  Addendum Date: 01/08/2018   ADDENDUM REPORT: 01/08/2018 09:38 ADDENDUM: The 8 o'clock subareolar mass biopsy demonstrates no atypia or malignancy. 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 The findings are concordant. Recommend surgical consultation for management. I favor granulomatous mastitis over infection given the lack of acute symptoms prior to the biopsy. The mass at 10 o'clock, 3 cm from the nipple demonstrates fibrocystic changes. Imaging and pathology findings are concordant. No follow-up necessary in this region. 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 The findings are concordant. Recommend continued oncologic follow up. The patient also had a mass at 10 o'clock, 5 cm from the nipple which was not biopsied. Recommend a six-month follow-up ultrasound to ensure stability or resolution. These findings will be  called to the patient today. Electronically Signed   By: Dorise Bullion III M.D   On: 01/08/2018 09:38   Result Date: 01/08/2018 CLINICAL DATA:  Patient presents for ultrasound-guided core needle biopsy of a 8 o'clock position retroareolar right breast mass, initially found by palpation. A smaller adjacent 10 o'clock position, 3 cm the nipple, mass was also discovered on diagnostic imaging, also to undergo ultrasound-guided core needle biopsy. In addition, patient had 6 enlarged abnormal right axillary lymph nodes, 1 of which will be biopsied under ultrasound guidance. Patient carries the presumptive diagnosis of CLL. EXAM: ULTRASOUND GUIDED RIGHT BREAST CORE NEEDLE BIOPSY: Lesion #1 ULTRASOUND GUIDED RIGHT BREAST CORE NEEDLE BIOPSY: Lesion #2 ULTRASOUND GUIDED RIGHT AXILLARY LYMPH NODE CORE NEEDLE BIOPSY: Lesion #3 COMPARISON:  Previous exam(s). FINDINGS: I met with the patient and we discussed the procedure of ultrasound-guided biopsy, including benefits and alternatives. We discussed the high likelihood of a successful procedure. We discussed the risks of the procedure, including infection, bleeding, tissue injury, clip migration, and inadequate sampling. Informed written consent was given. The usual time-out protocol was performed immediately prior to the procedure. Lesion #1: 8 o'clock retroareolar. Lesion quadrant: Lower inner quadrant Using sterile technique and 1% Lidocaine as local anesthetic, under direct ultrasound visualization, a 12 gauge spring-loaded device was used to perform biopsy of the retroareolar 1.3  cm mass using a lateral approach. At the conclusion of the procedure a coil shaped tissue marker clip was deployed into the biopsy cavity. Lesion #2: 10 o'clock, 3 cm from the nipple. Lesion quadrant: Upper outer quadrant Using sterile technique and 1% Lidocaine as local anesthetic, under direct ultrasound visualization, a 12 gauge spring-loaded device was used to perform biopsy of the 10  o'clock position mass using an inferior approach. At the conclusion of the procedure a ribbon shaped tissue marker clip was deployed into the biopsy cavity. Lesion #3: Abnormal right axillary lymph node. Using sterile technique and 1% Lidocaine as local anesthetic, under direct ultrasound visualization, a 14 gauge spring-loaded device was used to perform biopsy of 1 of the abnormal right axillary lymph nodes using an inferior approach. At the conclusion of the procedure a HydroMARK tissue marker clip was deployed into the biopsy cavity. Follow up 2 view mammogram was performed and dictated separately. IMPRESSION: Ultrasound guided biopsy of 3 lesions, 2 in the right breast and 1 abnormal right axillary lymph node. No apparent complications. Electronically Signed: By: Lajean Manes M.D. On: 01/05/2018 09:34    ASSESSMENT: CLL, Rai stage 1, breast mass  PLAN:    1. CLL: Confirmed by peripheral blood flow cytometry.  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  CT scans from Jan 15, 2018 reviewed independently and report as above confirming progression of disease.  Patient now has insurance approval, therefore we will proceed with cycle 1, day 1 of Rituxan and Treanda today.  Return to clinic tomorrow for Treanda only, in 1 week for laboratory work and further evaluation, and then in 4 weeks for consideration of cycle 2, day 1.  Plan to do at least 3 cycles and then reimage. 2. Lymphadenopathy: Secondary CLL.  Treatment as above 3.  Leukocytosis: Secondary to CLL.  Treatment as above. 4.  Breast mass: Pathology results reviewed independently reporting benign disease.  Monitor.  Approximately 30 minutes was spent in discussion of which greater than 50% was consultation.   Patient expressed understanding and was in agreement with this plan. She also understands that She can call clinic at any time with any questions, concerns, or  complaints.    Lloyd Huger, MD   01/29/2018 9:31 AM  Addendum: Patient unable to get treatment today secondary to insurance approval.  Once this is obtained, will call patient back to initiate cycle 1.

## 2018-01-31 ENCOUNTER — Emergency Department
Admission: EM | Admit: 2018-01-31 | Discharge: 2018-01-31 | Disposition: A | Discharge status: Home / Self Care | Payer: BLUE CROSS/BLUE SHIELD | Attending: Emergency Medicine | Admitting: Emergency Medicine

## 2018-01-31 ENCOUNTER — Encounter: Payer: Self-pay | Admitting: Emergency Medicine

## 2018-01-31 ENCOUNTER — Emergency Department: Payer: BLUE CROSS/BLUE SHIELD

## 2018-01-31 DIAGNOSIS — F17210 Nicotine dependence, cigarettes, uncomplicated: Secondary | ICD-10-CM | POA: Insufficient documentation

## 2018-01-31 DIAGNOSIS — M25531 Pain in right wrist: Secondary | ICD-10-CM | POA: Insufficient documentation

## 2018-01-31 DIAGNOSIS — Z856 Personal history of leukemia: Secondary | ICD-10-CM | POA: Insufficient documentation

## 2018-01-31 DIAGNOSIS — Z8541 Personal history of malignant neoplasm of cervix uteri: Secondary | ICD-10-CM | POA: Diagnosis not present

## 2018-01-31 DIAGNOSIS — Z79899 Other long term (current) drug therapy: Secondary | ICD-10-CM | POA: Insufficient documentation

## 2018-01-31 DIAGNOSIS — R609 Edema, unspecified: Secondary | ICD-10-CM

## 2018-01-31 DIAGNOSIS — M25439 Effusion, unspecified wrist: Secondary | ICD-10-CM

## 2018-01-31 DIAGNOSIS — R2231 Localized swelling, mass and lump, right upper limb: Secondary | ICD-10-CM | POA: Diagnosis not present

## 2018-01-31 DIAGNOSIS — M7989 Other specified soft tissue disorders: Secondary | ICD-10-CM | POA: Diagnosis not present

## 2018-01-31 DIAGNOSIS — M25431 Effusion, right wrist: Secondary | ICD-10-CM | POA: Diagnosis not present

## 2018-01-31 DIAGNOSIS — Z9221 Personal history of antineoplastic chemotherapy: Secondary | ICD-10-CM | POA: Diagnosis not present

## 2018-01-31 DIAGNOSIS — M79601 Pain in right arm: Secondary | ICD-10-CM | POA: Diagnosis not present

## 2018-01-31 MED ORDER — ASPIRIN EC 81 MG PO TBEC: 81.0000 mg | DELAYED_RELEASE_TABLET | Freq: Every day | ORAL | 0 refills | Status: DC

## 2018-01-31 MED ORDER — IBUPROFEN 600 MG PO TABS
600.0000 mg | ORAL_TABLET | Freq: Once | ORAL | Status: AC
  Administered 2018-01-31: 600 mg via ORAL
  Filled 2018-01-31: qty 1

## 2018-01-31 MED ORDER — ASPIRIN EC 81 MG PO TBEC: 81.0000 mg | DELAYED_RELEASE_TABLET | Freq: Every day | ORAL | 2 refills | Status: DC

## 2018-01-31 NOTE — ED Notes (Signed)

## 2018-01-31 NOTE — ED Triage Notes (Signed)
Patient presents to the ED with swelling to veins and right arm.  Patient received chemo treatment and retoxin treatments at the cancer center last week for her leukemia.  Patient states, "If it's nothing that's great, I just want to make sure."  Patient denies any pain when she was receiving treatment.  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  Patient is in no obvious distress at this time.

## 2018-01-31 NOTE — ED Notes (Signed)
Looked for patient in family room.  Patient not there at this time.

## 2018-01-31 NOTE — ED Provider Notes (Signed)
[facility] Emergency Department Provider Note  ____________________________________________  Time seen: Approximately 7:22 PM  I have reviewed the triage vital signs and the nursing notes.   HISTORY  Chief Complaint Arm Swelling    HPI Monique [name] is a 47 y.o. female that presents emergency department for evaluation of tenderness and swelling to right wrist where patient had IVs for chemo treatments 3 days ago.  After treatments she noticed some swelling and tenderness around where the IV was.  No fevers, erythema, warmth, drainage.   Past Medical History:  Diagnosis Date  . Anxiety   . Cervical cancer ([place])    hx LEEP over 18 years ago.   . Cervical intraepithelial neoplasia I   . Chronic lymphocytic leukemia ([name])   . Depression   . Eating disorder   . History of self-harm   . Insomnia   . Obsession   . Pap smear abnormality of cervix with LGSIL   . Tobacco abuse   . Vitamin B12 deficiency (non anemic)     Patient Active Problem List   Diagnosis Date Noted  . Breast mass, right 01/07/2018  . Thoracic aortic atherosclerosis ([place]) 08/20/2017  . Emphysema of lung ([place]) 08/20/2017  . Low back pain 07/02/2017  . Adductor tendinitis 04/23/2017  . Trochanteric bursitis of right hip 04/23/2017  . GERD without esophagitis 12/11/2016  . CLL (chronic lymphocytic leukemia) ([place]) 11/24/2016  . Pap smear abnormality of cervix/human papillomavirus (HPV) positive 09/12/2016  . Stress incontinence 09/04/2016  . B12 deficiency 07/10/2015  . Insomnia, persistent 07/10/2015  . 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 Anorexia nervosa, restricting type 07/10/2015  . Anxiety, generalized 07/10/2015  . H/O suicide attempt 07/10/2015  . Lymphocytosis 07/10/2015  . Obsessive-compulsive disorder 07/10/2015  . Tobacco use 07/10/2015  . History of cervical dysplasia 07/18/2014    Past Surgical History:  Procedure  Laterality Date  . BREAST BIOPSY Right 01/05/2018   US guided biopsy of 2 areas and 1 lymph node  . CERVICAL BIOPSY  W/ LOOP ELECTRODE EXCISION    . TUBAL LIGATION      Prior to Admission medications   Medication Sig Start Date End Date Taking? Authorizing Provider  carbamazepine (TEGRETOL) 200 MG tablet TAKE 1 TABLET (200 MG TOTAL) BY MOUTH 3 (THREE) TIMES DAILY. 01/10/18   [name], MD  escitalopram (LEXAPRO) 10 MG tablet  12/09/17   [provider]  gabapentin (NEURONTIN) 100 MG capsule Take 100 mg twice a day for one week, then increase to 200 mg(2 tablets) twice a day and continue 12/09/17   [provider]  ondansetron (ZOFRAN) 8 MG tablet Take 1 tablet (8 mg total) by mouth 2 (two) times daily as needed for refractory nausea / vomiting. Patient not taking: Reported on 01/21/2018 01/20/18   Lloyd Huger, MD  prochlorperazine (COMPAZINE) 10 MG tablet Take 1 tablet (10 mg total) by mouth every 6 (six) hours as needed (Nausea or vomiting). Patient not taking: Reported on 01/21/2018 01/20/18   Lloyd Huger, MD  traMADol (ULTRAM) 50 MG tablet Take by mouth every 6 (six) hours as needed.    Carlynn Spry, PA-C  umeclidinium-vilanterol [facility] ELLIPTA) 62.5-25 MCG/INH AEPB TAKE 1 PUFF BY MOUTH EVERY DAY 10/02/17   [name], MD    Allergies Patient has no known allergies.  Family History  Problem Relation Age of Onset  . Cancer Mother        thyroid  . Alcohol  abuse Father   . Depression Sister   . Cancer Sister        cevical  . Alcohol abuse Brother   . Depression Brother   . Bipolar disorder Brother   . ADD / ADHD Son   . Breast cancer Neg Hx     Social History Social History   Tobacco Use  . Smoking status: Current Every Day Smoker    Packs/day: 1.00    Years: 31.00    Pack years: 31.00    Start date: 09/25/1986  . Smokeless tobacco: Never Used  . Tobacco comment: Would like to discuss Chantix- she forgot to take this medication   Substance Use Topics  . Alcohol use: Yes    Alcohol/week: 0.0 oz    Comment: rarely  . Drug use: No     Review of Systems  Constitutional: No fever Cardiovascular: No chest pain. Respiratory: No SOB. Gastrointestinal: No abdominal pain.  No vomiting.  Musculoskeletal: Negative for musculoskeletal pain. Skin: Negative for rash, abrasions, lacerations, ecchymosis.   ____________________________________________   PHYSICAL EXAM:  VITAL SIGNS: ED Triage Vitals  Enc Vitals Group     BP 01/31/18 1644 112/64     Pulse Rate 01/31/18 1644 84     Resp 01/31/18 1644 18     Temp 01/31/18 1644 98.9 F (37.2 C)     Temp Source 01/31/18 1644 Oral     SpO2 01/31/18 1644 99 %     Weight 01/31/18 1649 150 lb (68 kg)     Height 01/31/18 1649 5\' 7"  (1.702 m)     Head Circumference --      Peak Flow --      Pain Score 01/31/18 1649 0     Pain Loc --      Pain Edu? --      Excl. in [place]? --      Constitutional: Alert and oriented. Well appearing and in no acute distress. Eyes: Conjunctivae are normal. PERRL. EOMI. Head: Atraumatic. ENT:      Ears:      Nose: No congestion/rhinnorhea.      Mouth/Throat: Mucous membranes are moist.  Neck: No stridor.   Cardiovascular: Normal rate, regular rhythm.  Good peripheral circulation. Symmetric radial pulses bilaterally. Respiratory: Normal respiratory effort without tachypnea or retractions. Lungs CTAB. Good air entry to the bases with no decreased or absent breath sounds. Musculoskeletal: Full range of motion to all extremities. No gross deformities appreciated. Neurologic:  Normal speech and language. No gross focal neurologic deficits are appreciated.  Skin:  Skin is warm, dry.  Puncture to right hand and right  forearm consistent with IV insertion site.  No visible swelling, erythema, drainage.  Minimally tender to palpation. Psychiatric: Mood and affect are normal. Speech and behavior are normal. Patient exhibits appropriate insight and  judgement.   ____________________________________________   LABS (all labs ordered are listed, but only abnormal results are displayed)  Labs Reviewed - No data to display ____________________________________________  EKG   ____________________________________________  RADIOLOGY Robinette Haines, personally viewed and evaluated these images (plain radiographs) as part of my medical decision making, as well as reviewing the written report by the radiologist.  US Venous Img Upper Uni Right  Result Date: 01/31/2018 CLINICAL DATA:  Right arm pain, swelling EXAM: RIGHT UPPER EXTREMITY VENOUS DOPPLER ULTRASOUND TECHNIQUE: Gray-scale sonography with graded compression, as well as color Doppler and duplex ultrasound were performed to evaluate the upper extremity deep venous system from the level of the  subclavian vein and including the jugular, axillary, basilic, radial, ulnar and upper cephalic vein. Spectral Doppler was utilized to evaluate flow at rest and with distal augmentation maneuvers. COMPARISON:  None. FINDINGS: Contralateral Subclavian Vein: Respiratory phasicity is normal and symmetric with the symptomatic side. No evidence of thrombus. Normal compressibility. Internal Jugular Vein: No evidence of thrombus. Normal compressibility, respiratory phasicity and response to augmentation. Subclavian Vein: No evidence of thrombus. Normal compressibility, respiratory phasicity and response to augmentation. Axillary Vein: No evidence of thrombus. Normal compressibility, respiratory phasicity and response to augmentation. Cephalic Vein: No evidence of thrombus. Normal compressibility, respiratory phasicity and response to augmentation. Basilic Vein: No evidence of thrombus. Normal compressibility, respiratory phasicity and response to augmentation. Brachial Veins: No evidence of thrombus. Normal compressibility, respiratory phasicity and response to augmentation. Radial Veins: No evidence of thrombus.  Normal compressibility, respiratory phasicity and response to augmentation. Ulnar Veins: No evidence of thrombus. Normal compressibility, respiratory phasicity and response to augmentation. Venous Reflux:  None visualized. Other Findings:  None visualized. IMPRESSION: No evidence of DVT within the  upper extremity. Electronically Signed   By: Rolm Baptise M.D.   On: 01/31/2018 18:20    ____________________________________________    PROCEDURES  Procedure(s) performed:    Procedures    Medications  ibuprofen (ADVIL,MOTRIN) tablet 600 mg (600 mg Oral Given 01/31/18 1700)     ____________________________________________   INITIAL IMPRESSION / ASSESSMENT AND PLAN / ED COURSE  Pertinent labs & imaging results that were available during my care of the patient were reviewed by me and considered in my medical decision making (see chart for details).  Review of the [place] CSRS was performed in accordance of the [place] prior to dispensing any controlled drugs.    Patient presented to the emergency department for evaluation of swelling around IV site 3 days ago.  Vital signs and exam are reassuring. Ultrasound negative for DVT> No signs of infection.  Patient is going to apply cold compresses and take ibuprofen for phelbitis. Patient has appointment with PCP on Wednesday.  Patient is to follow up with PCP as directed. Patient is given ED precautions to return to the ED for any worsening or new symptoms.     ____________________________________________  FINAL CLINICAL IMPRESSION(S) / ED DIAGNOSES  Final diagnoses:  Swelling      NEW MEDICATIONS STARTED DURING THIS VISIT:  ED Discharge Orders    None          This chart was dictated using voice recognition software/Dragon. Despite best efforts to proofread, errors can occur which can change the meaning. Any change was purely unintentional.    Laban Emperor, PA-C 02/01/18 0016    Arta Silence, MD 02/01/18  [phone]

## 2018-02-01 NOTE — Progress Notes (Signed)
[place]  [phone] [phone]  ID: [name] OB: 1971-04-08  MR#: [id number]  TKZ#:[id number]  Patient Care Team: [name], MD as PCP - General (Family Medicine)  CHIEF COMPLAINT: CLL.  INTERVAL HISTORY: Patient returns to clinic today for further evaluation to assess her toleration of cycle 1, day 1 of Rituxan and Treanda.  She was recently diagnosed with bronchitis and is currently on antibiotics.  She otherwise tolerated her treatment well without significant side effects. Her lymphadenopathy is improved. She continues to be anxious.  She has no neurologic complaints. She denies any fevers, illnesses, or weight loss. She has no chest pain or shortness of breath. She denies any nausea, vomiting, constipation, or diarrhea. She has no urinary complaints.  Patient offers no further specific complaints today.  REVIEW OF SYSTEMS:   Review of Systems  Constitutional: Negative.  Negative for fever, malaise/fatigue and weight loss.  HENT: Negative.  Negative for congestion.   Respiratory: Positive for cough. Negative for shortness of breath.   Cardiovascular: Negative.  Negative for chest pain and leg swelling.  Gastrointestinal: Negative.  Negative for abdominal pain and constipation.  Genitourinary: Negative.  Negative for dysuria.  Musculoskeletal: Negative.  Negative for myalgias and neck pain.  Skin: Negative.  Negative for rash.  Neurological: Negative.  Negative for tingling, sensory change, focal weakness and weakness.  Psychiatric/Behavioral: The patient is nervous/anxious.     As per HPI. Otherwise, a complete review of systems is negative.  PAST MEDICAL HISTORY: Past Medical History:  Diagnosis Date  . Anxiety   . Cervical cancer ([place])    hx LEEP over 18 years ago.   . Cervical intraepithelial neoplasia I   . Chronic lymphocytic leukemia ([place])   . Depression   . Eating disorder   . History of self-harm   .  Insomnia   . Obsession   . Pap smear abnormality of cervix with LGSIL   . Tobacco abuse   . Vitamin B12 deficiency (non anemic)     PAST SURGICAL HISTORY: Past Surgical History:  Procedure Laterality Date  . BREAST BIOPSY Right 01/05/2018   US guided biopsy of 2 areas and 1 lymph node  . CERVICAL BIOPSY  W/ LOOP ELECTRODE EXCISION    . TUBAL LIGATION      FAMILY HISTORY: Family History  Problem Relation Age of Onset  . Cancer Mother        thyroid  . Alcohol abuse Father   . Depression Sister   . Cancer Sister        cevical  . Alcohol abuse Brother   . Depression Brother   . Bipolar disorder Brother   . ADD / ADHD Son   . Breast cancer Neg Hx     ADVANCED DIRECTIVES (Y/N):  N  HEALTH MAINTENANCE: Social History   Tobacco Use  . Smoking status: Current Every Day Smoker    Packs/day: 1.00    Years: 31.00    Pack years: 31.00    Start date: 09/25/1986  . Smokeless tobacco: Never Used  . Tobacco comment: Would like to discuss Chantix- she forgot to take this medication  Substance Use Topics  . Alcohol use: Yes    Alcohol/week: 0.0 oz    Comment: rarely  . Drug use: No     Colonoscopy:  PAP:  Bone density:  Lipid panel:  No Known Allergies  Current Outpatient Medications  Medication Sig Dispense Refill  . carbamazepine (TEGRETOL) 200 MG tablet TAKE  1 TABLET (200 MG TOTAL) BY MOUTH 3 (THREE) TIMES DAILY. 90 tablet 0  . chlorpheniramine-HYDROcodone (TUSSIONEX) 10-8 MG/5ML SUER Take 5 mLs by mouth 2 (two) times daily. 140 mL 0  . escitalopram (LEXAPRO) 10 MG tablet     . gabapentin (NEURONTIN) 100 MG capsule Take 100 mg twice a day for one week, then increase to 200 mg(2 tablets) twice a day and continue    . predniSONE (STERAPRED UNI-PAK 21 TAB) 10 MG (21) TBPK tablet Take as directed 21 tablet 0  . Albuterol Sulfate (PROAIR RESPICLICK) 409 (90 Base) MCG/ACT AEPB Inhale 1 puff into the lungs 3 (three) times daily as needed. (Patient not taking: Reported on  02/04/2018) 1 each 0  . ondansetron (ZOFRAN) 8 MG tablet Take 1 tablet (8 mg total) by mouth 2 (two) times daily as needed for refractory nausea / vomiting. (Patient not taking: Reported on 01/21/2018) 30 tablet 2  . prochlorperazine (COMPAZINE) 10 MG tablet Take 1 tablet (10 mg total) by mouth every 6 (six) hours as needed (Nausea or vomiting). (Patient not taking: Reported on 01/21/2018) 60 tablet 2  . traMADol (ULTRAM) 50 MG tablet Take by mouth every 6 (six) hours as needed.    . umeclidinium-vilanterol (ANORO ELLIPTA) 62.5-25 MCG/INH AEPB TAKE 1 PUFF BY MOUTH EVERY DAY (Patient not taking: Reported on 02/04/2018) 60 each 5   No current facility-administered medications for this visit.     OBJECTIVE: Vitals:   02/04/18 1551  BP: 119/75  Pulse: 88  Resp: 18  Temp: 98.6 F (37 C)     Body mass index is 23.63 kg/m.    ECOG FS:0 - Asymptomatic  General: Well-developed, well-nourished, no acute distress. Eyes: Pink conjunctiva, anicteric sclera. Lungs: Clear to auscultation bilaterally. Heart: Regular rate and rhythm. No rubs, murmurs, or gallops. Abdomen: Soft, nontender, nondistended. No organomegaly noted, normoactive bowel sounds. Musculoskeletal: No edema, cyanosis, or clubbing. Neuro: Alert, answering all questions appropriately. Cranial nerves grossly intact. Skin: No rashes or petechiae noted. Psych: Normal affect. Lymphatics: Palpable cervical and axillary adenopathy.  No palpable nodules on patient's elbows.  LAB RESULTS:  Lab Results  Component Value Date   NA 132 (L) 02/04/2018   K 4.2 02/04/2018   CL 101 02/04/2018   CO2 21 (L) 02/04/2018   GLUCOSE 223 (H) 02/04/2018   BUN 15 02/04/2018   CREATININE 0.92 02/04/2018   CALCIUM 8.7 (L) 02/04/2018   PROT 6.9 02/04/2018   ALBUMIN 4.0 02/04/2018   AST 29 02/04/2018   ALT 15 02/04/2018   ALKPHOS 63 02/04/2018   BILITOT 0.5 02/04/2018   GFRNONAA >60 02/04/2018   GFRAA >60 02/04/2018    Lab Results  Component Value  Date   WBC 13.1 (H) 02/04/2018   NEUTROABS 3.5 02/04/2018   HGB 13.0 02/04/2018   HCT 38.4 02/04/2018   MCV 96.9 02/04/2018   PLT 193 02/04/2018     STUDIES: Dg Chest 2 View  Result Date: 02/03/2018 CLINICAL DATA:  Worsening cough with congestion over the last week, chemotherapy 1 week ago for history of cervical carcinoma and leukemia, smoking history EXAM: CHEST - 2 VIEW COMPARISON:  CT chest of 01/15/2018 FINDINGS: No active infiltrate or effusion is seen. The mediastinal adenopathy noted on CT the chest is only faintly visualized on chest x-ray. There is mild peribronchial thickening which may indicate bronchitis. Mediastinal and hilar contours otherwise are unremarkable. The heart is within normal limits in size. No bony abnormality is seen. IMPRESSION: 1. No pneumonia or effusion.  Question bronchitis. 2. The mediastinal adenopathy noted by CT is not as well visualized by chest x-ray. Electronically Signed   By: Ivar Drape M.D.   On: 02/03/2018 12:03   Ct Soft Tissue Neck W Contrast  Result Date: 01/15/2018 CLINICAL DATA:  47 year old female with chronic lymphocytic leukemia. Restaging. EXAM: CT NECK WITH CONTRAST TECHNIQUE: Multidetector CT imaging of the neck was performed using the standard protocol following the bolus administration of intravenous contrast. CONTRAST:  135mL ISOVUE-300 IOPAMIDOL (ISOVUE-300) INJECTION 61% in conjunction with contrast enhanced imaging of the chest, abdomen, and pelvis reported separately. COMPARISON:  Brain MRI 01/01/2018.  Neck CT 10/01/2017 and earlier. FINDINGS: Pharynx and larynx: Glottis is closed as before. Laryngeal and pharyngeal soft tissue contours remain normal. Negative parapharyngeal and retropharyngeal spaces. Salivary glands: The sublingual space, submandibular glands, and parotid glands are stable and within normal limits. Thyroid: Stable and normal. Lymph nodes: Generalized cervical and upper thoracic lymphadenopathy. The largest nodes in  the neck bilaterally are generally 1 millimeter increased compared to the January CT. For example, the largest left level 2 B lymph node on series 7, image 42 measures 11 millimeter short axis and was 8-9 millimeters previously. None of the nodal stations demonstrate decreased lymph node size or number. No cystic or necrotic nodes. Vascular: Major vascular structures in the neck and at the skull base remain patent. Limited intracranial: Negative. Visualized orbits: Negative. Mastoids and visualized paranasal sinuses: Clear. Skeleton: Negative. Upper chest: Reported separately today. IMPRESSION: 1. Stable to mild progression of CLL since January. Diffuse cervical and upper thoracic lymphadenopathy with many of the largest nodes increased in size by about 1 mm since 10/01/2017. 2.  CT Chest, Abdomen, and Pelvis today are reported separately. Electronically Signed   By: Genevie Ann M.D.   On: 01/15/2018 13:41   Ct Chest W Contrast  Result Date: 01/15/2018 CLINICAL DATA:  Follow up of chronic lymphocytic leukemia. Restaging. EXAM: CT CHEST, ABDOMEN, AND PELVIS WITH CONTRAST TECHNIQUE: Multidetector CT imaging of the chest, abdomen and pelvis was performed following the standard protocol during bolus administration of intravenous contrast. CONTRAST:  15mL ISOVUE-300 IOPAMIDOL (ISOVUE-300) INJECTION 61% COMPARISON:  CT scan 10/01/2017 FINDINGS: CT CHEST FINDINGS Cardiovascular: Unremarkable Mediastinum/Nodes: Notable supraclavicular, axillary, subpectoral, paratracheal, bilateral hilar, right infrahilar, and subcarinal adenopathy. An index left axillary lymph node measures 2.0 cm in short axis on image 16/3, previously 1.7 cm. An index subpectoral lymph node measures 2.1 cm in short axis on image 10/3, previously 1.6 cm. Index right paratracheal node 1.4 cm in short axis on image 18/3, previously 1.1 cm. Subcarinal node 1.7 cm in short axis on image 25/3, formerly the same. Lungs/Pleura: Centrilobular emphysema. 5 by 4 mm  right upper lobe subpleural nodule on image 36/5, stable compared to earliest available comparison of 01/21/2017. Mild subpleural nodularity along the minor fissure including a 0.6 by 0.5 cm nodule on image 74/5, stable. Additional small nodules in the right middle lobe and left lower lobe are stable compared to 01/21/2017. Musculoskeletal: Unremarkable CT ABDOMEN PELVIS FINDINGS Hepatobiliary: Unremarkable Pancreas: Unremarkable Spleen: There is a flat mass of soft tissue along the splenic hilum measuring 5.0 by 1.4 cm on image 56/3, previously 4.1 by 1.3 cm, and likely representing conglomerate adenopathy. This is hypodense relative to the splenic parenchyma and is not thought to represent an accessory spleen. Adrenals/Urinary Tract: Adrenal glands and kidneys appear normal. Displacement of the left ureter by the retroperitoneal mass. Stomach/Bowel: Prominent stool throughout the colon favors constipation. 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 There is some mild atherosclerotic calcification at the aortic bifurcation. Extensive adenopathy in the right gastric chain, porta hepatis and peripancreatic region, celiac and upper mesenteric region, retroperitoneum, omental, central mesentery, and along the iliac chains. A dominant left periaortic mass or node measures 4.8 cm in short axis, previously by my measurement 4.5 cm. A portacaval node measures 2.5 cm in short axis on image 64/3, formerly 2.3 cm. A left external iliac lymph node measures 3.2 cm in short axis on image 114/3, previously 2.9 cm. Reproductive: Unremarkable Other: No supplemental non-categorized findings. Musculoskeletal: Unremarkable IMPRESSION: 1. Progressive bulky adenopathy in the chest, abdomen, and pelvis, compatible with mild worsening of disease. 2. 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 3. Aortic  Atherosclerosis (ICD10-I70.0) and Emphysema (ICD10-J43.9). 4. Stable small lung nodules, unchanged from earliest available comparison of 01/21/2017. Continued surveillance likely warranted. 5.  Prominent stool throughout the colon favors constipation. Electronically Signed   By: Van Clines M.D.   On: 01/15/2018 14:41   Ct Abdomen Pelvis W Contrast  Result Date: 01/15/2018 CLINICAL DATA:  Follow up of chronic lymphocytic leukemia. Restaging. EXAM: CT CHEST, ABDOMEN, AND PELVIS WITH CONTRAST TECHNIQUE: Multidetector CT imaging of the chest, abdomen and pelvis was performed following the standard protocol during bolus administration of intravenous contrast. CONTRAST:  133mL ISOVUE-300 IOPAMIDOL (ISOVUE-300) INJECTION 61% COMPARISON:  CT scan 10/01/2017 FINDINGS: CT CHEST FINDINGS Cardiovascular: Unremarkable Mediastinum/Nodes: Notable supraclavicular, axillary, subpectoral, paratracheal, bilateral hilar, right infrahilar, and subcarinal adenopathy. An index left axillary lymph node measures 2.0 cm in short axis on image 16/3, previously 1.7 cm. An index subpectoral lymph node measures 2.1 cm in short axis on image 10/3, previously 1.6 cm. Index right paratracheal node 1.4 cm in short axis on image 18/3, previously 1.1 cm. Subcarinal node 1.7 cm in short axis on image 25/3, formerly the same. Lungs/Pleura: Centrilobular emphysema. 5 by 4 mm right upper lobe subpleural nodule on image 36/5, stable compared to earliest available comparison of 01/21/2017. Mild subpleural nodularity along the minor fissure including a 0.6 by 0.5 cm nodule on image 74/5, stable. Additional small nodules in the right middle lobe and left lower lobe are stable compared to 01/21/2017. Musculoskeletal: Unremarkable CT ABDOMEN PELVIS FINDINGS Hepatobiliary: Unremarkable Pancreas: Unremarkable Spleen: There is a flat mass of soft tissue along the splenic hilum measuring 5.0 by 1.4 cm on image 56/3, previously 4.1 by 1.3 cm, and likely  representing conglomerate adenopathy. This is hypodense relative to the splenic parenchyma and is not thought to represent an accessory spleen. Adrenals/Urinary Tract: Adrenal glands and kidneys appear normal. Displacement of the left ureter by the retroperitoneal mass. Stomach/Bowel: Prominent stool throughout the colon favors constipation. 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 There is some mild atherosclerotic calcification at the aortic bifurcation. Extensive adenopathy in the right gastric chain, porta hepatis and peripancreatic region, celiac and upper mesenteric region, retroperitoneum, omental, central mesentery, and along the iliac chains. A dominant left periaortic mass or node measures 4.8 cm in short axis, previously by my measurement 4.5 cm. A portacaval node measures 2.5 cm in short axis on image 64/3, formerly 2.3 cm. A left external iliac lymph node measures 3.2 cm in short axis on image 114/3, previously 2.9 cm. Reproductive: Unremarkable Other: No supplemental non-categorized findings. Musculoskeletal: Unremarkable IMPRESSION: 1. Progressive bulky adenopathy in the chest, abdomen, and pelvis, compatible  with mild worsening of disease. 2. 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 3. Aortic Atherosclerosis (ICD10-I70.0) and Emphysema (ICD10-J43.9). 4. Stable small lung nodules, unchanged from earliest available comparison of 01/21/2017. Continued surveillance likely warranted. 5.  Prominent stool throughout the colon favors constipation. Electronically Signed   By: Van Clines M.D.   On: 01/15/2018 14:41   US Venous Img Upper Uni Right  Result Date: 01/31/2018 CLINICAL DATA:  Right arm pain, swelling EXAM: RIGHT UPPER EXTREMITY VENOUS DOPPLER ULTRASOUND TECHNIQUE: Gray-scale sonography with graded compression, as well as color Doppler and duplex ultrasound  were performed to evaluate the upper extremity deep venous system from the level of the subclavian vein and including the jugular, axillary, basilic, radial, ulnar and upper cephalic vein. Spectral Doppler was utilized to evaluate flow at rest and with distal augmentation maneuvers. COMPARISON:  None. FINDINGS: Contralateral Subclavian Vein: Respiratory phasicity is normal and symmetric with the symptomatic side. No evidence of thrombus. Normal compressibility. Internal Jugular Vein: No evidence of thrombus. Normal compressibility, respiratory phasicity and response to augmentation. Subclavian Vein: No evidence of thrombus. Normal compressibility, respiratory phasicity and response to augmentation. Axillary Vein: No evidence of thrombus. Normal compressibility, respiratory phasicity and response to augmentation. Cephalic Vein: No evidence of thrombus. Normal compressibility, respiratory phasicity and response to augmentation. Basilic Vein: No evidence of thrombus. Normal compressibility, respiratory phasicity and response to augmentation. Brachial Veins: No evidence of thrombus. Normal compressibility, respiratory phasicity and response to augmentation. Radial Veins: No evidence of thrombus. Normal compressibility, respiratory phasicity and response to augmentation. Ulnar Veins: No evidence of thrombus. Normal compressibility, respiratory phasicity and response to augmentation. Venous Reflux:  None visualized. Other Findings:  None visualized. IMPRESSION: No evidence of DVT within the  upper extremity. Electronically Signed   By: Rolm Baptise M.D.   On: 01/31/2018 18:20    ASSESSMENT: CLL, Rai stage 1, breast mass  PLAN:    1. CLL: Confirmed by peripheral blood flow cytometry.  CT scans from Jan 15, 2018 reviewed independently and report as above confirming progression of disease.  Patient tolerated cycle 1, day 1 of Rituxan and Treanda last week without significant side effects.  Her white blood cell count  and lymphadenopathy are improved. Return to clinic in 3 weeks for consideration of cycle 2, day 1.  Plan to do at least 3 cycles and then reimage. 2. Lymphadenopathy: Improving. Secondary CLL.  Treatment as above 3. Leukocytosis: Improving. Secondary to CLL.  Treatment as above. 4.  Breast mass: Pathology results reviewed independently reporting benign disease.  Monitor.  Approximately 30 minutes was spend in discussion of which greater than 50% was consultation.  Patient expressed understanding and was in agreement with this plan. She also understands that She can call clinic at any time with any questions, concerns, or complaints.    Lloyd Huger, MD   02/08/2018 10:34 AM

## 2018-02-03 ENCOUNTER — Ambulatory Visit
Admission: RE | Admit: 2018-02-03 | Discharge: 2018-02-03 | Disposition: A | Discharge status: Home / Self Care | Payer: BLUE CROSS/BLUE SHIELD | Source: Ambulatory Visit | Attending: Oncology | Admitting: Oncology

## 2018-02-03 ENCOUNTER — Other Ambulatory Visit: Payer: Self-pay | Admitting: Oncology

## 2018-02-03 ENCOUNTER — Inpatient Hospital Stay (HOSPITAL_BASED_OUTPATIENT_CLINIC_OR_DEPARTMENT_OTHER): Payer: BLUE CROSS/BLUE SHIELD | Admitting: Oncology

## 2018-02-03 ENCOUNTER — Telehealth: Payer: Self-pay | Admitting: *Deleted

## 2018-02-03 DIAGNOSIS — Z720 Tobacco use: Secondary | ICD-10-CM | POA: Diagnosis not present

## 2018-02-03 DIAGNOSIS — R05 Cough: Secondary | ICD-10-CM

## 2018-02-03 DIAGNOSIS — J449 Chronic obstructive pulmonary disease, unspecified: Secondary | ICD-10-CM | POA: Diagnosis not present

## 2018-02-03 DIAGNOSIS — J40 Bronchitis, not specified as acute or chronic: Secondary | ICD-10-CM | POA: Diagnosis not present

## 2018-02-03 DIAGNOSIS — Z5111 Encounter for antineoplastic chemotherapy: Secondary | ICD-10-CM | POA: Diagnosis not present

## 2018-02-03 DIAGNOSIS — M545 Low back pain: Secondary | ICD-10-CM | POA: Diagnosis not present

## 2018-02-03 DIAGNOSIS — J040 Acute laryngitis: Secondary | ICD-10-CM | POA: Diagnosis not present

## 2018-02-03 DIAGNOSIS — Z5112 Encounter for antineoplastic immunotherapy: Secondary | ICD-10-CM | POA: Diagnosis not present

## 2018-02-03 DIAGNOSIS — N63 Unspecified lump in unspecified breast: Secondary | ICD-10-CM | POA: Diagnosis not present

## 2018-02-03 DIAGNOSIS — F329 Major depressive disorder, single episode, unspecified: Secondary | ICD-10-CM | POA: Diagnosis not present

## 2018-02-03 DIAGNOSIS — C9110 Chronic lymphocytic leukemia of B-cell type not having achieved remission: Secondary | ICD-10-CM

## 2018-02-03 DIAGNOSIS — R062 Wheezing: Secondary | ICD-10-CM

## 2018-02-03 DIAGNOSIS — D72829 Elevated white blood cell count, unspecified: Secondary | ICD-10-CM | POA: Diagnosis not present

## 2018-02-03 DIAGNOSIS — R599 Enlarged lymph nodes, unspecified: Secondary | ICD-10-CM | POA: Diagnosis not present

## 2018-02-03 DIAGNOSIS — R059 Cough, unspecified: Secondary | ICD-10-CM

## 2018-02-03 DIAGNOSIS — F429 Obsessive-compulsive disorder, unspecified: Secondary | ICD-10-CM | POA: Diagnosis not present

## 2018-02-03 DIAGNOSIS — J439 Emphysema, unspecified: Secondary | ICD-10-CM | POA: Diagnosis not present

## 2018-02-03 MED ORDER — ALBUTEROL SULFATE 108 (90 BASE) MCG/ACT IN AEPB: 1.0000 | INHALATION_SPRAY | Freq: Three times a day (TID) | RESPIRATORY_TRACT | 0 refills | Status: DC | PRN

## 2018-02-03 MED ORDER — HYDROCOD POLST-CPM POLST ER 10-8 MG/5ML PO SUER: 5.0000 mL | Freq: Two times a day (BID) | ORAL | 0 refills | Status: DC

## 2018-02-03 MED ORDER — PREDNISONE 10 MG (21) PO TBPK: ORAL_TABLET | ORAL | 0 refills | Status: DC

## 2018-02-03 NOTE — Progress Notes (Signed)
Symptom Management Consult note [facility]  [phone] [phone]  Patient Care Team: [name], MD as PCP - General (Family Medicine)   Name of the patient: Monique [name]  [id number]  11-06-1970   Date of visit: 02/03/18  Diagnosis- CLL  Chief complaint/ Reason for visit-cough  Heme/Onc history: Patient last seen by [name] primary medical oncologist on 01/28/2017 for evaluation of cycle 1 day 1 of Rituxan and Treanda.  At that visit she complained of bilateral "knots" on her elbows, worsening axillary lymphadenopathy and anxiety.  Patient with recent CLL diagnosis by flow cytometry.  Previously not being treated and on observation.    S/p 4 cycles of weekly Rituxan completed on 02/20/2017.  Scans from January 2018 revealed minimal progression.   Developed right breast mass in March 2019.  Had diagnostic mammogram performed on 12/26/2016 showing suspicious right breast mass.  Biopsy revealed granulomatous mastitis.  She was treated with doxycycline 100 mg twice daily for 7 days.  Developed worsening adenopathy so CT chest/abdomen/pelvis and soft tissue neck May 2019 revealed progressive bulky adenopathy in the chest, abdomen and pelvis compatible with mild worsening of disease.   [name] recommended beginning treatment with Rituxan and Treanda.  Interval history-   Patient complains of nonproductive cough.  Symptoms began 3 days ago.  The cough is non-productive, barking and is aggravated by nothing Associated symptoms include:chills and wheezing. Patient does not have new pets. Patient does not have a history of asthma. Patient does not have a history of environmental allergens. Patient has not recent travel. Patient does have a history of smoking. Patient  has not previous Chest X-ray.    ECOG FS:1 - Symptomatic but completely ambulatory  Review of systems- Review of Systems  Constitutional: Positive for  malaise/fatigue. Negative for chills, fever and weight loss.  HENT: Negative for congestion and ear pain.   Eyes: Negative.  Negative for blurred vision and double vision.  Respiratory: Positive for cough and wheezing. Negative for sputum production and shortness of breath.   Cardiovascular: Negative.  Negative for chest pain, palpitations and leg swelling.  Gastrointestinal: Negative.  Negative for abdominal pain, constipation, diarrhea, nausea and vomiting.  Genitourinary: Negative for dysuria, frequency and urgency.  Musculoskeletal: Positive for back pain (Coughing). Negative for falls.  Skin: Negative.  Negative for rash.  Neurological: Positive for weakness. Negative for headaches.  Endo/Heme/Allergies: Negative.  Does not bruise/bleed easily.  Psychiatric/Behavioral: Negative.  Negative for depression. The patient is not nervous/anxious and does not have insomnia.      Current treatment- S/p Cycle 1 Rituxan and Treanda on 01/28/2018  No Known Allergies   Past Medical History:  Diagnosis Date  . Anxiety   . Cervical cancer ([place])    hx LEEP over 18 years ago.   . Cervical intraepithelial neoplasia I   . Chronic lymphocytic leukemia ([place])   . Depression   . Eating disorder   . History of self-harm   . Insomnia   . Obsession   . Pap smear abnormality of cervix with LGSIL   . Tobacco abuse   . Vitamin B12 deficiency (non anemic)      Past Surgical History:  Procedure Laterality Date  . BREAST BIOPSY Right 01/05/2018   US guided biopsy of 2 areas and 1 lymph node  . CERVICAL BIOPSY  W/ LOOP ELECTRODE EXCISION    . TUBAL LIGATION      Social History   Socioeconomic History  .  Marital status: Divorced    Spouse name: Not on file  . Number of children: Not on file  . Years of education: Not on file  . Highest education level: Not on file  Occupational History  . Not on file  Social Needs  . Financial resource strain: Not on file  . Food insecurity:    Worry: Not  on file    Inability: Not on file  . Transportation needs:    Medical: Not on file    Non-medical: Not on file  Tobacco Use  . Smoking status: Current Every Day Smoker    Packs/day: 1.00    Years: 31.00    Pack years: 31.00    Start date: 09/25/1986  . Smokeless tobacco: Never Used  . Tobacco comment: Would like to discuss Chantix- she forgot to take this medication  Substance and Sexual Activity  . Alcohol use: Yes    Alcohol/week: 0.0 oz    Comment: rarely  . Drug use: No  . Sexual activity: Yes    Partners: Male    Birth control/protection: None  Lifestyle  . Physical activity:    Days per week: Not on file    Minutes per session: Not on file  . Stress: Not on file  Relationships  . Social connections:    Talks on phone: Not on file    Gets together: Not on file    Attends religious service: Not on file    Active member of club or organization: Not on file    Attends meetings of clubs or organizations: Not on file    Relationship status: Not on file  . Intimate partner violence:    Fear of current or ex partner: Not on file    Emotionally abused: Not on file    Physically abused: Not on file    Forced sexual activity: Not on file  Other Topics Concern  . Not on file  Social History Narrative  . Not on file    Family History  Problem Relation Age of Onset  . Cancer Mother        thyroid  . Alcohol abuse Father   . Depression Sister   . Cancer Sister        cevical  . Alcohol abuse Brother   . Depression Brother   . Bipolar disorder Brother   . ADD / ADHD Son   . Breast cancer Neg Hx      Current Outpatient Medications:  .  Albuterol Sulfate (PROAIR RESPICLICK) 710 (90 Base) MCG/ACT AEPB, Inhale 1 puff into the lungs 3 (three) times daily as needed., Disp: 1 each, Rfl: 0 .  carbamazepine (TEGRETOL) 200 MG tablet, TAKE 1 TABLET (200 MG TOTAL) BY MOUTH 3 (THREE) TIMES DAILY., Disp: 90 tablet, Rfl: 0 .  chlorpheniramine-HYDROcodone (TUSSIONEX) 10-8 MG/5ML  SUER, Take 5 mLs by mouth 2 (two) times daily., Disp: 140 mL, Rfl: 0 .  escitalopram (LEXAPRO) 10 MG tablet, , Disp: , Rfl:  .  gabapentin (NEURONTIN) 100 MG capsule, Take 100 mg twice a day for one week, then increase to 200 mg(2 tablets) twice a day and continue, Disp: , Rfl:  .  ondansetron (ZOFRAN) 8 MG tablet, Take 1 tablet (8 mg total) by mouth 2 (two) times daily as needed for refractory nausea / vomiting. (Patient not taking: Reported on 01/21/2018), Disp: 30 tablet, Rfl: 2 .  predniSONE (STERAPRED UNI-PAK 21 TAB) 10 MG (21) TBPK tablet, Take as directed, Disp: 21 tablet, Rfl: 0 .  prochlorperazine (COMPAZINE) 10 MG tablet, Take 1 tablet (10 mg total) by mouth every 6 (six) hours as needed (Nausea or vomiting). (Patient not taking: Reported on 01/21/2018), Disp: 60 tablet, Rfl: 2 .  traMADol (ULTRAM) 50 MG tablet, Take by mouth every 6 (six) hours as needed., Disp: , Rfl:  .  umeclidinium-vilanterol (ANORO ELLIPTA) 62.5-25 MCG/INH AEPB, TAKE 1 PUFF BY MOUTH EVERY DAY, Disp: 60 each, Rfl: 5  Physical exam: There were no vitals filed for this visit. Physical Exam  Constitutional: She is oriented to person, place, and time. Vital signs are normal.  HENT:  Head: Normocephalic and atraumatic.  Eyes: Pupils are equal, round, and reactive to light.  Neck: Normal range of motion.  Cardiovascular: Normal rate, regular rhythm and normal heart sounds.  No murmur heard. Pulmonary/Chest: Effort normal. She has wheezes.  Abdominal: Soft. Normal appearance and bowel sounds are normal. She exhibits no distension. There is no tenderness.  Musculoskeletal: Normal range of motion. She exhibits no edema.  Neurological: She is alert and oriented to person, place, and time.  Skin: Skin is warm and dry. No rash noted.  Psychiatric: Judgment normal.     CMP Latest Ref Rng & Units 01/28/2018  Glucose 65 - 99 mg/dL 134(H)  BUN 6 - 20 mg/dL 15  Creatinine 0.44 - 1.00 mg/dL 0.79  Sodium 135 - 145 mmol/L 137    Potassium 3.5 - 5.1 mmol/L 4.3  Chloride 101 - 111 mmol/L 105  CO2 22 - 32 mmol/L 25  Calcium 8.9 - 10.3 mg/dL 8.6(L)  Total Protein 6.5 - 8.1 g/dL 6.2(L)  Total Bilirubin 0.3 - 1.2 mg/dL 0.5  Alkaline Phos 38 - 126 U/L 56  AST 15 - 41 U/L 25  ALT 14 - 54 U/L 18   CBC Latest Ref Rng & Units 01/28/2018  WBC 3.6 - 11.0 K/uL 42.3(H)  Hemoglobin 12.0 - 16.0 g/dL 12.8  Hematocrit 35.0 - 47.0 % 38.0  Platelets 150 - 440 K/uL 190    No images are attached to the encounter.  Dg Chest 2 View  Result Date: 02/03/2018 CLINICAL DATA:  Worsening cough with congestion over the last week, chemotherapy 1 week ago for history of cervical carcinoma and leukemia, smoking history EXAM: CHEST - 2 VIEW COMPARISON:  CT chest of 01/15/2018 FINDINGS: No active infiltrate or effusion is seen. The mediastinal adenopathy noted on CT the chest is only faintly visualized on chest x-ray. There is mild peribronchial thickening which may indicate bronchitis. Mediastinal and hilar contours otherwise are unremarkable. The heart is within normal limits in size. No bony abnormality is seen. IMPRESSION: 1. No pneumonia or effusion.  Question bronchitis. 2. The mediastinal adenopathy noted by CT is not as well visualized by chest x-ray. Electronically Signed   By: Ivar Drape M.D.   On: 02/03/2018 12:03   Ct Soft Tissue Neck W Contrast  Result Date: 01/15/2018 CLINICAL DATA:  47 year old female with chronic lymphocytic leukemia. Restaging. EXAM: CT NECK WITH CONTRAST TECHNIQUE: Multidetector CT imaging of the neck was performed using the standard protocol following the bolus administration of intravenous contrast. CONTRAST:  12m ISOVUE-300 IOPAMIDOL (ISOVUE-300) INJECTION 61% in conjunction with contrast enhanced imaging of the chest, abdomen, and pelvis reported separately. COMPARISON:  Brain MRI 01/01/2018.  Neck CT 10/01/2017 and earlier. FINDINGS: Pharynx and larynx: Glottis is closed as before. Laryngeal and pharyngeal  soft tissue contours remain normal. Negative parapharyngeal and retropharyngeal spaces. Salivary glands: The sublingual space, submandibular glands, and parotid glands are stable  and within normal limits. Thyroid: Stable and normal. Lymph nodes: Generalized cervical and upper thoracic lymphadenopathy. The largest nodes in the neck bilaterally are generally 1 millimeter increased compared to the January CT. For example, the largest left level 2 B lymph node on series 7, image 42 measures 11 millimeter short axis and was 8-9 millimeters previously. None of the nodal stations demonstrate decreased lymph node size or number. No cystic or necrotic nodes. Vascular: Major vascular structures in the neck and at the skull base remain patent. Limited intracranial: Negative. Visualized orbits: Negative. Mastoids and visualized paranasal sinuses: Clear. Skeleton: Negative. Upper chest: Reported separately today. IMPRESSION: 1. Stable to mild progression of CLL since January. Diffuse cervical and upper thoracic lymphadenopathy with many of the largest nodes increased in size by about 1 mm since 10/01/2017. 2.  CT Chest, Abdomen, and Pelvis today are reported separately. Electronically Signed   By: Genevie Ann M.D.   On: 01/15/2018 13:41   Ct Chest W Contrast  Result Date: 01/15/2018 CLINICAL DATA:  Follow up of chronic lymphocytic leukemia. Restaging. EXAM: CT CHEST, ABDOMEN, AND PELVIS WITH CONTRAST TECHNIQUE: Multidetector CT imaging of the chest, abdomen and pelvis was performed following the standard protocol during bolus administration of intravenous contrast. CONTRAST:  123m ISOVUE-300 IOPAMIDOL (ISOVUE-300) INJECTION 61% COMPARISON:  CT scan 10/01/2017 FINDINGS: CT CHEST FINDINGS Cardiovascular: Unremarkable Mediastinum/Nodes: Notable supraclavicular, axillary, subpectoral, paratracheal, bilateral hilar, right infrahilar, and subcarinal adenopathy. An index left axillary lymph node measures 2.0 cm in short axis on image  16/3, previously 1.7 cm. An index subpectoral lymph node measures 2.1 cm in short axis on image 10/3, previously 1.6 cm. Index right paratracheal node 1.4 cm in short axis on image 18/3, previously 1.1 cm. Subcarinal node 1.7 cm in short axis on image 25/3, formerly the same. Lungs/Pleura: Centrilobular emphysema. 5 by 4 mm right upper lobe subpleural nodule on image 36/5, stable compared to earliest available comparison of 01/21/2017. Mild subpleural nodularity along the minor fissure including a 0.6 by 0.5 cm nodule on image 74/5, stable. Additional small nodules in the right middle lobe and left lower lobe are stable compared to 01/21/2017. Musculoskeletal: Unremarkable CT ABDOMEN PELVIS FINDINGS Hepatobiliary: Unremarkable Pancreas: Unremarkable Spleen: There is a flat mass of soft tissue along the splenic hilum measuring 5.0 by 1.4 cm on image 56/3, previously 4.1 by 1.3 cm, and likely representing conglomerate adenopathy. This is hypodense relative to the splenic parenchyma and is not thought to represent an accessory spleen. Adrenals/Urinary Tract: Adrenal glands and kidneys appear normal. Displacement of the left ureter by the retroperitoneal mass. Stomach/Bowel: Prominent stool throughout the colon favors constipation. 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 There is some mild atherosclerotic calcification at the aortic bifurcation. Extensive adenopathy in the right gastric chain, porta hepatis and peripancreatic region, celiac and upper mesenteric region, retroperitoneum, omental, central mesentery, and along the iliac chains. A dominant left periaortic mass or node measures 4.8 cm in short axis, previously by my measurement 4.5 cm. A portacaval node measures 2.5 cm in short axis on image 64/3, formerly 2.3 cm. A left external iliac lymph node measures 3.2 cm in short axis on image 114/3, previously 2.9 cm.  Reproductive: Unremarkable Other: No supplemental non-categorized findings. Musculoskeletal: Unremarkable IMPRESSION: 1. Progressive bulky adenopathy in the chest, abdomen, and pelvis, compatible with mild worsening of disease. 2. 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 3. Aortic Atherosclerosis (ICD10-I70.0) and Emphysema (  ICD10-J43.9). 4. Stable small lung nodules, unchanged from earliest available comparison of 01/21/2017. Continued surveillance likely warranted. 5.  Prominent stool throughout the colon favors constipation. Electronically Signed   By: Van Clines M.D.   On: 01/15/2018 14:41   Ct Abdomen Pelvis W Contrast  Result Date: 01/15/2018 CLINICAL DATA:  Follow up of chronic lymphocytic leukemia. Restaging. EXAM: CT CHEST, ABDOMEN, AND PELVIS WITH CONTRAST TECHNIQUE: Multidetector CT imaging of the chest, abdomen and pelvis was performed following the standard protocol during bolus administration of intravenous contrast. CONTRAST:  16m ISOVUE-300 IOPAMIDOL (ISOVUE-300) INJECTION 61% COMPARISON:  CT scan 10/01/2017 FINDINGS: CT CHEST FINDINGS Cardiovascular: Unremarkable Mediastinum/Nodes: Notable supraclavicular, axillary, subpectoral, paratracheal, bilateral hilar, right infrahilar, and subcarinal adenopathy. An index left axillary lymph node measures 2.0 cm in short axis on image 16/3, previously 1.7 cm. An index subpectoral lymph node measures 2.1 cm in short axis on image 10/3, previously 1.6 cm. Index right paratracheal node 1.4 cm in short axis on image 18/3, previously 1.1 cm. Subcarinal node 1.7 cm in short axis on image 25/3, formerly the same. Lungs/Pleura: Centrilobular emphysema. 5 by 4 mm right upper lobe subpleural nodule on image 36/5, stable compared to earliest available comparison of 01/21/2017. Mild subpleural nodularity along the minor fissure including a 0.6 by 0.5 cm nodule on image 74/5, stable. Additional small nodules in the  right middle lobe and left lower lobe are stable compared to 01/21/2017. Musculoskeletal: Unremarkable CT ABDOMEN PELVIS FINDINGS Hepatobiliary: Unremarkable Pancreas: Unremarkable Spleen: There is a flat mass of soft tissue along the splenic hilum measuring 5.0 by 1.4 cm on image 56/3, previously 4.1 by 1.3 cm, and likely representing conglomerate adenopathy. This is hypodense relative to the splenic parenchyma and is not thought to represent an accessory spleen. Adrenals/Urinary Tract: Adrenal glands and kidneys appear normal. Displacement of the left ureter by the retroperitoneal mass. Stomach/Bowel: Prominent stool throughout the colon favors constipation. 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 There is some mild atherosclerotic calcification at the aortic bifurcation. Extensive adenopathy in the right gastric chain, porta hepatis and peripancreatic region, celiac and upper mesenteric region, retroperitoneum, omental, central mesentery, and along the iliac chains. A dominant left periaortic mass or node measures 4.8 cm in short axis, previously by my measurement 4.5 cm. A portacaval node measures 2.5 cm in short axis on image 64/3, formerly 2.3 cm. A left external iliac lymph node measures 3.2 cm in short axis on image 114/3, previously 2.9 cm. Reproductive: Unremarkable Other: No supplemental non-categorized findings. Musculoskeletal: Unremarkable IMPRESSION: 1. Progressive bulky adenopathy in the chest, abdomen, and pelvis, compatible with mild worsening of disease. 2. 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 3. Aortic Atherosclerosis (ICD10-I70.0) and Emphysema (ICD10-J43.9). 4. Stable small lung nodules, unchanged from earliest available comparison of 01/21/2017. Continued surveillance likely warranted. 5.  Prominent stool throughout the colon favors constipation.  Electronically Signed   By: WVan ClinesM.D.   On: 01/15/2018 14:41   [place]ous Img Upper Uni Right  Result Date: 01/31/2018 CLINICAL DATA:  Right arm pain, swelling EXAM: RIGHT UPPER EXTREMITY VENOUS DOPPLER ULTRASOUND TECHNIQUE: Gray-scale sonography with graded compression, as well as color Doppler and duplex ultrasound were performed to evaluate the upper extremity deep venous system from the level of the subclavian vein and including the jugular, axillary, basilic, radial, ulnar and upper cephalic vein. Spectral Doppler was utilized to evaluate flow at rest and with distal augmentation maneuvers.  COMPARISON:  None. FINDINGS: Contralateral Subclavian Vein: Respiratory phasicity is normal and symmetric with the symptomatic side. No evidence of thrombus. Normal compressibility. Internal Jugular Vein: No evidence of thrombus. Normal compressibility, respiratory phasicity and response to augmentation. Subclavian Vein: No evidence of thrombus. Normal compressibility, respiratory phasicity and response to augmentation. Axillary Vein: No evidence of thrombus. Normal compressibility, respiratory phasicity and response to augmentation. Cephalic Vein: No evidence of thrombus. Normal compressibility, respiratory phasicity and response to augmentation. Basilic Vein: No evidence of thrombus. Normal compressibility, respiratory phasicity and response to augmentation. Brachial Veins: No evidence of thrombus. Normal compressibility, respiratory phasicity and response to augmentation. Radial Veins: No evidence of thrombus. Normal compressibility, respiratory phasicity and response to augmentation. Ulnar Veins: No evidence of thrombus. Normal compressibility, respiratory phasicity and response to augmentation. Venous Reflux:  None visualized. Other Findings:  None visualized. IMPRESSION: No evidence of DVT within the  upper extremity. Electronically Signed   By: Rolm Baptise M.D.   On: 01/31/2018 18:20   Mm Clip  Placement Right  Result Date: 01/05/2018 CLINICAL DATA:  Patient is status post ultrasound-guided core needle biopsy of 8 o'clock position retroareolar right breast mass, a 10 o'clock positions smaller right breast mass and an abnormal right axillary lymph node. EXAM: DIAGNOSTIC RIGHT MAMMOGRAM POST ULTRASOUND BIOPSY COMPARISON:  Previous exam(s). FINDINGS: Mammographic images were obtained following ultrasound guided biopsy of 2 right breast masses and an abnormal right axillary lymph node. The coil shaped biopsy clip lies within the retroareolar breast mass. The heart shaped biopsy clip lies in the expected location of the smaller, 10 o'clock position mass. The [facility] biopsy clip use for the right axillary lymph node is not visualized on the included field of view. IMPRESSION: Well-positioned biopsy clips in the right breast following ultrasound-guided core needle biopsy 2 right breast masses. The axillary lymph node HydroMARK biopsy clip is not visualized mammographically. Final Assessment: Post Procedure Mammograms for Marker Placement Electronically Signed   By: Lajean Manes M.D.   On: 01/05/2018 09:39   [place] Rt Breast Bx W Loc Dev 1st Lesion Img Bx Spec US Guide  Addendum Date: 01/08/2018   ADDENDUM REPORT: 01/08/2018 09:38 ADDENDUM: The 8 o'clock subareolar mass biopsy demonstrates no atypia or malignancy. 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 The findings are concordant. Recommend surgical consultation for management. I favor granulomatous mastitis over infection given the lack of acute symptoms prior to the biopsy. The mass at 10 o'clock, 3 cm from the nipple demonstrates fibrocystic changes. Imaging and pathology findings are concordant. No follow-up necessary in this region. 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 The findings are concordant. Recommend continued oncologic follow up.  The patient also had a mass at 10 o'clock, 5 cm from the nipple which was not biopsied. Recommend a six-month follow-up ultrasound to ensure stability or resolution. These findings will be called to the patient today. Electronically Signed   By: Dorise Bullion III M.D   On: 01/08/2018 09:38   Result Date: 01/08/2018 CLINICAL DATA:  Patient presents for ultrasound-guided core needle biopsy of a 8 o'clock position retroareolar right breast mass, initially found by palpation. A smaller adjacent 10 o'clock position, 3 cm the nipple, mass was also discovered on diagnostic imaging, also to undergo ultrasound-guided core needle biopsy. In addition, patient had 6 enlarged abnormal right axillary lymph nodes, 1 of which will be biopsied under ultrasound guidance. Patient carries the presumptive diagnosis of CLL. EXAM:  ULTRASOUND GUIDED RIGHT BREAST CORE NEEDLE BIOPSY: Lesion #1 ULTRASOUND GUIDED RIGHT BREAST CORE NEEDLE BIOPSY: Lesion #2 ULTRASOUND GUIDED RIGHT AXILLARY LYMPH NODE CORE NEEDLE BIOPSY: Lesion #3 COMPARISON:  Previous exam(s). FINDINGS: I met with the patient and we discussed the procedure of ultrasound-guided biopsy, including benefits and alternatives. We discussed the high likelihood of a successful procedure. We discussed the risks of the procedure, including infection, bleeding, tissue injury, clip migration, and inadequate sampling. Informed written consent was given. The usual time-out protocol was performed immediately prior to the procedure. Lesion #1: 8 o'clock retroareolar. Lesion quadrant: Lower inner quadrant Using sterile technique and 1% Lidocaine as local anesthetic, under direct ultrasound visualization, a 12 gauge spring-loaded device was used to perform biopsy of the retroareolar 1.3 cm mass using a lateral approach. At the conclusion of the procedure a coil shaped tissue marker clip was deployed into the biopsy cavity. Lesion #2: 10 o'clock, 3 cm from the nipple. Lesion quadrant: Upper  outer quadrant Using sterile technique and 1% Lidocaine as local anesthetic, under direct ultrasound visualization, a 12 gauge spring-loaded device was used to perform biopsy of the 10 o'clock position mass using an inferior approach. At the conclusion of the procedure a ribbon shaped tissue marker clip was deployed into the biopsy cavity. Lesion #3: Abnormal right axillary lymph node. Using sterile technique and 1% Lidocaine as local anesthetic, under direct ultrasound visualization, a 14 gauge spring-loaded device was used to perform biopsy of 1 of the abnormal right axillary lymph nodes using an inferior approach. At the conclusion of the procedure a HydroMARK tissue marker clip was deployed into the biopsy cavity. Follow up 2 view mammogram was performed and dictated separately. IMPRESSION: Ultrasound guided biopsy of 3 lesions, 2 in the right breast and 1 abnormal right axillary lymph node. No apparent complications. Electronically Signed: By: Lajean Manes M.D. On: 01/05/2018 09:34   [place] Rt Breast Bx W Loc Dev Ea Add Lesion Img Bx Spec US Guide  Addendum Date: 01/08/2018   ADDENDUM REPORT: 01/08/2018 09:38 ADDENDUM: The 8 o'clock subareolar mass biopsy demonstrates no atypia or malignancy. 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 The findings are concordant. Recommend surgical consultation for management. I favor granulomatous mastitis over infection given the lack of acute symptoms prior to the biopsy. The mass at 10 o'clock, 3 cm from the nipple demonstrates fibrocystic changes. Imaging and pathology findings are concordant. No follow-up necessary in this region. 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 The findings are concordant. Recommend continued oncologic follow up. The patient also had a mass at 10 o'clock, 5 cm from the nipple which was not biopsied. Recommend a six-month follow-up  ultrasound to ensure stability or resolution. These findings will be called to the patient today. Electronically Signed   By: Dorise Bullion III M.D   On: 01/08/2018 09:38   Result Date: 01/08/2018 CLINICAL DATA:  Patient presents for ultrasound-guided core needle biopsy of a 8 o'clock position retroareolar right breast mass, initially found by palpation. A smaller adjacent 10 o'clock position, 3 cm the nipple, mass was also discovered on diagnostic imaging, also to undergo ultrasound-guided core needle biopsy. In addition, patient had 6 enlarged abnormal right axillary lymph nodes, 1 of which will be biopsied under ultrasound guidance. Patient carries the presumptive diagnosis of CLL. EXAM: ULTRASOUND GUIDED RIGHT BREAST CORE NEEDLE BIOPSY: Lesion #1 ULTRASOUND GUIDED RIGHT BREAST CORE NEEDLE BIOPSY: Lesion #2 ULTRASOUND GUIDED RIGHT AXILLARY  LYMPH NODE CORE NEEDLE BIOPSY: Lesion #3 COMPARISON:  Previous exam(s). FINDINGS: I met with the patient and we discussed the procedure of ultrasound-guided biopsy, including benefits and alternatives. We discussed the high likelihood of a successful procedure. We discussed the risks of the procedure, including infection, bleeding, tissue injury, clip migration, and inadequate sampling. Informed written consent was given. The usual time-out protocol was performed immediately prior to the procedure. Lesion #1: 8 o'clock retroareolar. Lesion quadrant: Lower inner quadrant Using sterile technique and 1% Lidocaine as local anesthetic, under direct ultrasound visualization, a 12 gauge spring-loaded device was used to perform biopsy of the retroareolar 1.3 cm mass using a lateral approach. At the conclusion of the procedure a coil shaped tissue marker clip was deployed into the biopsy cavity. Lesion #2: 10 o'clock, 3 cm from the nipple. Lesion quadrant: Upper outer quadrant Using sterile technique and 1% Lidocaine as local anesthetic, under direct ultrasound visualization, a 12  gauge spring-loaded device was used to perform biopsy of the 10 o'clock position mass using an inferior approach. At the conclusion of the procedure a ribbon shaped tissue marker clip was deployed into the biopsy cavity. Lesion #3: Abnormal right axillary lymph node. Using sterile technique and 1% Lidocaine as local anesthetic, under direct ultrasound visualization, a 14 gauge spring-loaded device was used to perform biopsy of 1 of the abnormal right axillary lymph nodes using an inferior approach. At the conclusion of the procedure a HydroMARK tissue marker clip was deployed into the biopsy cavity. Follow up 2 view mammogram was performed and dictated separately. IMPRESSION: Ultrasound guided biopsy of 3 lesions, 2 in the right breast and 1 abnormal right axillary lymph node. No apparent complications. Electronically Signed: By: Lajean Manes M.D. On: 01/05/2018 09:34   [place] Rt Breast Bx W Loc Dev Ea Add Lesion Img Bx Spec US Guide  Addendum Date: 01/08/2018   ADDENDUM REPORT: 01/08/2018 09:38 ADDENDUM: The 8 o'clock subareolar mass biopsy demonstrates no atypia or malignancy. 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 The findings are concordant. Recommend surgical consultation for management. I favor granulomatous mastitis over infection given the lack of acute symptoms prior to the biopsy. The mass at 10 o'clock, 3 cm from the nipple demonstrates fibrocystic changes. Imaging and pathology findings are concordant. No follow-up necessary in this region. 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 The findings are concordant. Recommend continued oncologic follow up. The patient also had a mass at 10 o'clock, 5 cm from the nipple which was not biopsied. Recommend a six-month follow-up ultrasound to ensure stability or resolution. These findings will be called to the patient today. Electronically Signed   By:  Dorise Bullion III M.D   On: 01/08/2018 09:38   Result Date: 01/08/2018 CLINICAL DATA:  Patient presents for ultrasound-guided core needle biopsy of a 8 o'clock position retroareolar right breast mass, initially found by palpation. A smaller adjacent 10 o'clock position, 3 cm the nipple, mass was also discovered on diagnostic imaging, also to undergo ultrasound-guided core needle biopsy. In addition, patient had 6 enlarged abnormal right axillary lymph nodes, 1 of which will be biopsied under ultrasound guidance. Patient carries the presumptive diagnosis of CLL. EXAM: ULTRASOUND GUIDED RIGHT BREAST CORE NEEDLE BIOPSY: Lesion #1 ULTRASOUND GUIDED RIGHT BREAST CORE NEEDLE BIOPSY: Lesion #2 ULTRASOUND GUIDED RIGHT AXILLARY LYMPH NODE CORE NEEDLE BIOPSY: Lesion #3 COMPARISON:  Previous exam(s). FINDINGS: I met with the patient and we discussed the procedure  of ultrasound-guided biopsy, including benefits and alternatives. We discussed the high likelihood of a successful procedure. We discussed the risks of the procedure, including infection, bleeding, tissue injury, clip migration, and inadequate sampling. Informed written consent was given. The usual time-out protocol was performed immediately prior to the procedure. Lesion #1: 8 o'clock retroareolar. Lesion quadrant: Lower inner quadrant Using sterile technique and 1% Lidocaine as local anesthetic, under direct ultrasound visualization, a 12 gauge spring-loaded device was used to perform biopsy of the retroareolar 1.3 cm mass using a lateral approach. At the conclusion of the procedure a coil shaped tissue marker clip was deployed into the biopsy cavity. Lesion #2: 10 o'clock, 3 cm from the nipple. Lesion quadrant: Upper outer quadrant Using sterile technique and 1% Lidocaine as local anesthetic, under direct ultrasound visualization, a 12 gauge spring-loaded device was used to perform biopsy of the 10 o'clock position mass using an inferior approach. At the  conclusion of the procedure a ribbon shaped tissue marker clip was deployed into the biopsy cavity. Lesion #3: Abnormal right axillary lymph node. Using sterile technique and 1% Lidocaine as local anesthetic, under direct ultrasound visualization, a 14 gauge spring-loaded device was used to perform biopsy of 1 of the abnormal right axillary lymph nodes using an inferior approach. At the conclusion of the procedure a HydroMARK tissue marker clip was deployed into the biopsy cavity. Follow up 2 view mammogram was performed and dictated separately. IMPRESSION: Ultrasound guided biopsy of 3 lesions, 2 in the right breast and 1 abnormal right axillary lymph node. No apparent complications. Electronically Signed: By: Lajean Manes M.D. On: 01/05/2018 09:34     Assessment and plan- Patient is a 47 y.o. female who presents for cough x3 days.  1.  CLL: S/p Cycle 1 of Rituxan and Treanda. Tolerated well. Scheduled for cycle 2 tomorrow.  2.  Cough: Cough began approximately 3 days ago.  Has worsened over time.  STAT chest x-ray revealed possible bronchitis.  Patient wheezing with auscultation bilaterally. RX Prednisone Tablet Pak. RX Tussionex for cough. RX albuterol inhaler.  Patient afebrile during her visit today.  Side effects of each medication reviewed with patient.  Patient has a prescription for Anoro.  Encouraged her to use as prescribed.  If symptoms fail to improve in the next several days or continue to get worse including the development of a fever, encouraged her to let us know.     Visit Diagnosis 1. Bronchitis     Patient expressed understanding and was in agreement with this plan. She also understands that She can call clinic at any time with any questions, concerns, or complaints.   Greater than 50% was spent in counseling and coordination of care with this patient including but not limited to discussion of the relevant topics above (See A&P) including, but not limited to diagnosis and  management of acute and chronic medical conditions.    Faythe Casa, AGNP-C [facility] [place]- [phone] Pager- [phone] 02/03/2018 2:33 PM

## 2018-02-03 NOTE — Telephone Encounter (Signed)
Patient called requesting that Sonia Baller return her call regarding the pain she is having [phone]

## 2018-02-03 NOTE — Telephone Encounter (Signed)
Patient coming in for CXR and to see NP per Lorretta Harp, NP

## 2018-02-04 ENCOUNTER — Inpatient Hospital Stay: Payer: BLUE CROSS/BLUE SHIELD

## 2018-02-04 ENCOUNTER — Inpatient Hospital Stay (HOSPITAL_BASED_OUTPATIENT_CLINIC_OR_DEPARTMENT_OTHER): Payer: BLUE CROSS/BLUE SHIELD | Admitting: Oncology

## 2018-02-04 ENCOUNTER — Other Ambulatory Visit: Payer: Self-pay

## 2018-02-04 VITALS — BP 119/75 | HR 88 | Temp 98.6°F | Resp 18 | Wt 150.9 lb

## 2018-02-04 DIAGNOSIS — Z720 Tobacco use: Secondary | ICD-10-CM

## 2018-02-04 DIAGNOSIS — J040 Acute laryngitis: Secondary | ICD-10-CM | POA: Diagnosis not present

## 2018-02-04 DIAGNOSIS — C9110 Chronic lymphocytic leukemia of B-cell type not having achieved remission: Secondary | ICD-10-CM

## 2018-02-04 DIAGNOSIS — R599 Enlarged lymph nodes, unspecified: Secondary | ICD-10-CM | POA: Diagnosis not present

## 2018-02-04 DIAGNOSIS — Z5112 Encounter for antineoplastic immunotherapy: Secondary | ICD-10-CM | POA: Diagnosis not present

## 2018-02-04 DIAGNOSIS — Z5111 Encounter for antineoplastic chemotherapy: Secondary | ICD-10-CM | POA: Diagnosis not present

## 2018-02-04 DIAGNOSIS — N63 Unspecified lump in unspecified breast: Secondary | ICD-10-CM | POA: Diagnosis not present

## 2018-02-04 DIAGNOSIS — R062 Wheezing: Secondary | ICD-10-CM | POA: Diagnosis not present

## 2018-02-04 DIAGNOSIS — M545 Low back pain: Secondary | ICD-10-CM | POA: Diagnosis not present

## 2018-02-04 DIAGNOSIS — J40 Bronchitis, not specified as acute or chronic: Secondary | ICD-10-CM | POA: Diagnosis not present

## 2018-02-04 DIAGNOSIS — J449 Chronic obstructive pulmonary disease, unspecified: Secondary | ICD-10-CM | POA: Diagnosis not present

## 2018-02-04 DIAGNOSIS — R05 Cough: Secondary | ICD-10-CM | POA: Diagnosis not present

## 2018-02-04 DIAGNOSIS — F329 Major depressive disorder, single episode, unspecified: Secondary | ICD-10-CM | POA: Diagnosis not present

## 2018-02-04 DIAGNOSIS — F429 Obsessive-compulsive disorder, unspecified: Secondary | ICD-10-CM | POA: Diagnosis not present

## 2018-02-04 DIAGNOSIS — D72829 Elevated white blood cell count, unspecified: Secondary | ICD-10-CM | POA: Diagnosis not present

## 2018-02-04 DIAGNOSIS — J439 Emphysema, unspecified: Secondary | ICD-10-CM | POA: Diagnosis not present

## 2018-02-04 LAB — COMPREHENSIVE METABOLIC PANEL
ALK PHOS: 63 U/L (ref 38–126)
ALT: 15 U/L (ref 14–54)
ANION GAP: 10 (ref 5–15)
AST: 29 U/L (ref 15–41)
Albumin: 4 g/dL (ref 3.5–5.0)
BILIRUBIN TOTAL: 0.5 mg/dL (ref 0.3–1.2)
BUN: 15 mg/dL (ref 6–20)
CALCIUM: 8.7 mg/dL — AB (ref 8.9–10.3)
CO2: 21 mmol/L — AB (ref 22–32)
Chloride: 101 mmol/L (ref 101–111)
Creatinine, Ser: 0.92 mg/dL (ref 0.44–1.00)
Glucose, Bld: 223 mg/dL — ABNORMAL HIGH (ref 65–99)
Potassium: 4.2 mmol/L (ref 3.5–5.1)
SODIUM: 132 mmol/L — AB (ref 135–145)
TOTAL PROTEIN: 6.9 g/dL (ref 6.5–8.1)

## 2018-02-04 LAB — CBC WITH DIFFERENTIAL/PLATELET
BASOS ABS: 0 10*3/uL (ref 0–0.1)
BASOS PCT: 0 %
EOS ABS: 0 10*3/uL (ref 0–0.7)
Eosinophils Relative: 0 %
HEMATOCRIT: 38.4 % (ref 35.0–47.0)
HEMOGLOBIN: 13 g/dL (ref 12.0–16.0)
Lymphocytes Relative: 73 %
Lymphs Abs: 9.4 10*3/uL — ABNORMAL HIGH (ref 1.0–3.6)
MCH: 32.8 pg (ref 26.0–34.0)
MCHC: 33.9 g/dL (ref 32.0–36.0)
MCV: 96.9 fL (ref 80.0–100.0)
Monocytes Absolute: 0.1 10*3/uL — ABNORMAL LOW (ref 0.2–0.9)
Monocytes Relative: 1 %
NEUTROS PCT: 26 %
Neutro Abs: 3.5 10*3/uL (ref 1.4–6.5)
Platelets: 193 10*3/uL (ref 150–440)
RBC: 3.96 MIL/uL (ref 3.80–5.20)
RDW: 13.2 % (ref 11.5–14.5)
WBC: 13.1 10*3/uL — AB (ref 3.6–11.0)

## 2018-02-04 NOTE — Progress Notes (Signed)
Here for follow up.. Per pt saw Sonia Baller NP and had cxr -w dx of bronchitis per pt. Started steroid dose pack   No abx per med info.

## 2018-02-11 ENCOUNTER — Encounter: Payer: Self-pay | Admitting: Oncology

## 2018-02-11 ENCOUNTER — Inpatient Hospital Stay (HOSPITAL_BASED_OUTPATIENT_CLINIC_OR_DEPARTMENT_OTHER): Payer: BLUE CROSS/BLUE SHIELD | Admitting: Oncology

## 2018-02-11 VITALS — BP 107/71 | HR 86 | Temp 98.8°F | Resp 20 | Wt 152.0 lb

## 2018-02-11 DIAGNOSIS — R599 Enlarged lymph nodes, unspecified: Secondary | ICD-10-CM | POA: Diagnosis not present

## 2018-02-11 DIAGNOSIS — R062 Wheezing: Secondary | ICD-10-CM | POA: Diagnosis not present

## 2018-02-11 DIAGNOSIS — Z720 Tobacco use: Secondary | ICD-10-CM | POA: Diagnosis not present

## 2018-02-11 DIAGNOSIS — J040 Acute laryngitis: Secondary | ICD-10-CM | POA: Diagnosis not present

## 2018-02-11 DIAGNOSIS — F329 Major depressive disorder, single episode, unspecified: Secondary | ICD-10-CM | POA: Diagnosis not present

## 2018-02-11 DIAGNOSIS — J40 Bronchitis, not specified as acute or chronic: Secondary | ICD-10-CM | POA: Diagnosis not present

## 2018-02-11 DIAGNOSIS — R05 Cough: Secondary | ICD-10-CM

## 2018-02-11 DIAGNOSIS — F429 Obsessive-compulsive disorder, unspecified: Secondary | ICD-10-CM | POA: Diagnosis not present

## 2018-02-11 DIAGNOSIS — N63 Unspecified lump in unspecified breast: Secondary | ICD-10-CM | POA: Diagnosis not present

## 2018-02-11 DIAGNOSIS — M545 Low back pain: Secondary | ICD-10-CM | POA: Diagnosis not present

## 2018-02-11 DIAGNOSIS — R059 Cough, unspecified: Secondary | ICD-10-CM

## 2018-02-11 DIAGNOSIS — C9110 Chronic lymphocytic leukemia of B-cell type not having achieved remission: Secondary | ICD-10-CM

## 2018-02-11 DIAGNOSIS — J439 Emphysema, unspecified: Secondary | ICD-10-CM | POA: Diagnosis not present

## 2018-02-11 DIAGNOSIS — D72829 Elevated white blood cell count, unspecified: Secondary | ICD-10-CM | POA: Diagnosis not present

## 2018-02-11 DIAGNOSIS — Z5111 Encounter for antineoplastic chemotherapy: Secondary | ICD-10-CM | POA: Diagnosis not present

## 2018-02-11 DIAGNOSIS — Z5112 Encounter for antineoplastic immunotherapy: Secondary | ICD-10-CM | POA: Diagnosis not present

## 2018-02-11 DIAGNOSIS — J449 Chronic obstructive pulmonary disease, unspecified: Secondary | ICD-10-CM | POA: Diagnosis not present

## 2018-02-11 MED ORDER — HYDROCOD POLST-CPM POLST ER 10-8 MG/5ML PO SUER: 5.0000 mL | Freq: Two times a day (BID) | ORAL | 0 refills | Status: DC

## 2018-02-11 MED ORDER — LEVOFLOXACIN 500 MG PO TABS: 500.0000 mg | ORAL_TABLET | Freq: Every day | ORAL | 0 refills | Status: DC

## 2018-02-11 NOTE — Progress Notes (Signed)
Symptom Management Consult note [facility]  [phone] [phone]  Patient Care Team: [name], MD as PCP - General (Family Medicine)   Name of the patient: Monique [name]  [id number]  August 15, 1971   Date of visit: 02/11/18  Diagnosis- CLL  Chief complaint/ Reason for visit- Horse voice/cough  Heme/Onc history: Patient was recently seen and evaluated by the primary medical oncologist [name] on 02/04/2018 to assess her toleration of cycle 1 day 1 of Rituxan and Treanda.  She appeared to tolerate treatment well and was improving from recent diagnosis of bronchitis.  Lymphadenopathy had improved.  She continued to be anxious but denied any other complaints.  She was scheduled to return to clinic in 3 weeks for consideration of cycle 2.  The plan is to do at least 3 cycles and then reimage.  She was seen in symptom management clinic on 02/03/2017 by me with complaints of cough X 3 days.  Had stat chest x-ray revealing possible bronchitis.  She received prednisone tablet pack, Tussionex for cough and an albuterol inhaler.  Patient was afebrile afebrile during her visit.   Patient with recent CLL diagnosis by flow cytometry.  Previously not being treated in observation.  S/p 4 cycles of weekly Rituxan completing on 02/20/2017 scans from January 2018 revealed minimal progression.  Developed right breast mass in March 2019.  Had diagnostic mammogram performed on 12/26/2017 showed suspicious right breast mass.  Biopsy revealed granulomatous mastitis.  She was treated with doxycycline 100 mg twice daily for 7 days.  Developed worsening adenopathy.  CT chest/abdomen/pelvis and soft tissue neck from May 2019 revealed progressive bulky adenopathy in the chest, abdomen and pelvis compatible with worsening disease.  [name] recommended beginning treatment with Rituxan and Treanda.  Received first cycle last week.  Interval history-  Patient  presents with hoarseness.  The patient describes moderate episodes of hoarseness which are gradually worsening over time.  The episodes began two days ago.  Associated symptoms were positive for a sore throat and dry mouth. Symptoms of gastroesophageal reflux are not noted.  Recently treated for bronchitis with steroid taper, albuterol inhaler and Tussionex for cough.  States she is uncertain if this helped.  Tussionex worked well for her cough especially at nighttime but  needs a refill.  She has been using her albuterol inhaler as well as a Anoro as prescribed.  ECOG FS:1 - Symptomatic but completely ambulatory  Review of systems- Review of Systems  Constitutional: Positive for malaise/fatigue. Negative for chills, fever and weight loss.  HENT: Positive for sore throat. Negative for congestion and ear pain.        Hoarseness  Eyes: Negative.  Negative for blurred vision and double vision.  Respiratory: Positive for cough. Negative for sputum production, shortness of breath and wheezing.   Cardiovascular: Negative.  Negative for chest pain, palpitations and leg swelling.  Gastrointestinal: Negative.  Negative for abdominal pain, constipation, diarrhea, nausea and vomiting.  Genitourinary: Negative for dysuria, frequency and urgency.  Musculoskeletal: Negative for back pain and falls.  Skin: Negative.  Negative for rash.  Neurological: Negative.  Negative for weakness and headaches.  Endo/Heme/Allergies: Negative.  Does not bruise/bleed easily.  Psychiatric/Behavioral: Negative.  Negative for depression. The patient is not nervous/anxious and does not have insomnia.      Current treatment- S/p Cycle 1 Rituxan and Treanda 5/15-5/16. Tolerated well.   No Known Allergies   Past Medical History:  Diagnosis Date  . Anxiety   .  Cervical cancer ([place])    hx LEEP over 18 years ago.   . Cervical intraepithelial neoplasia I   . Chronic lymphocytic leukemia ([place])   . Depression   . Eating  disorder   . History of self-harm   . Insomnia   . Obsession   . Pap smear abnormality of cervix with LGSIL   . Tobacco abuse   . Vitamin B12 deficiency (non anemic)      Past Surgical History:  Procedure Laterality Date  . BREAST BIOPSY Right 01/05/2018   US guided biopsy of 2 areas and 1 lymph node  . CERVICAL BIOPSY  W/ LOOP ELECTRODE EXCISION    . TUBAL LIGATION      Social History   Socioeconomic History  . Marital status: Divorced    Spouse name: Not on file  . Number of children: Not on file  . Years of education: Not on file  . Highest education level: Not on file  Occupational History  . Not on file  Social Needs  . Financial resource strain: Not on file  . Food insecurity:    Worry: Not on file    Inability: Not on file  . Transportation needs:    Medical: Not on file    Non-medical: Not on file  Tobacco Use  . Smoking status: Current Every Day Smoker    Packs/day: 1.00    Years: 31.00    Pack years: 31.00    Start date: 09/25/1986  . Smokeless tobacco: Never Used  . Tobacco comment: Would like to discuss Chantix- she forgot to take this medication  Substance and Sexual Activity  . Alcohol use: Yes    Alcohol/week: 0.0 oz    Comment: rarely  . Drug use: No  . Sexual activity: Yes    Partners: Male    Birth control/protection: None  Lifestyle  . Physical activity:    Days per week: Not on file    Minutes per session: Not on file  . Stress: Not on file  Relationships  . Social connections:    Talks on phone: Not on file    Gets together: Not on file    Attends religious service: Not on file    Active member of club or organization: Not on file    Attends meetings of clubs or organizations: Not on file    Relationship status: Not on file  . Intimate partner violence:    Fear of current or ex partner: Not on file    Emotionally abused: Not on file    Physically abused: Not on file    Forced sexual activity: Not on file  Other Topics Concern    . Not on file  Social History Narrative  . Not on file    Family History  Problem Relation Age of Onset  . Cancer Mother        thyroid  . Alcohol abuse Father   . Depression Sister   . Cancer Sister        cevical  . Alcohol abuse Brother   . Depression Brother   . Bipolar disorder Brother   . ADD / ADHD Son   . Breast cancer Neg Hx      Current Outpatient Medications:  .  Albuterol Sulfate (PROAIR RESPICLICK) 440 (90 Base) MCG/ACT AEPB, Inhale 1 puff into the lungs 3 (three) times daily as needed., Disp: 1 each, Rfl: 0 .  carbamazepine (TEGRETOL) 200 MG tablet, TAKE 1 TABLET (200 MG TOTAL) BY  MOUTH 3 (THREE) TIMES DAILY., Disp: 90 tablet, Rfl: 0 .  escitalopram (LEXAPRO) 10 MG tablet, , Disp: , Rfl:  .  gabapentin (NEURONTIN) 100 MG capsule, Take 100 mg twice a day for one week, then increase to 200 mg(2 tablets) twice a day and continue, Disp: , Rfl:  .  traMADol (ULTRAM) 50 MG tablet, Take by mouth every 6 (six) hours as needed., Disp: , Rfl:  .  umeclidinium-vilanterol (ANORO ELLIPTA) 62.5-25 MCG/INH AEPB, TAKE 1 PUFF BY MOUTH EVERY DAY, Disp: 60 each, Rfl: 5 .  chlorpheniramine-HYDROcodone (TUSSIONEX) 10-8 MG/5ML SUER, Take 5 mLs by mouth 2 (two) times daily., Disp: 140 mL, Rfl: 0 .  levofloxacin (LEVAQUIN) 500 MG tablet, Take 1 tablet (500 mg total) by mouth daily., Disp: 7 tablet, Rfl: 0 .  ondansetron (ZOFRAN) 8 MG tablet, Take 1 tablet (8 mg total) by mouth 2 (two) times daily as needed for refractory nausea / vomiting. (Patient not taking: Reported on 01/21/2018), Disp: 30 tablet, Rfl: 2 .  prochlorperazine (COMPAZINE) 10 MG tablet, Take 1 tablet (10 mg total) by mouth every 6 (six) hours as needed (Nausea or vomiting). (Patient not taking: Reported on 01/21/2018), Disp: 60 tablet, Rfl: 2  Physical exam:  Vitals:   02/11/18 1024  BP: 107/71  Pulse: 86  Resp: 20  Temp: 98.8 F (37.1 C)  TempSrc: Tympanic  SpO2: 96%  Weight: 152 lb (68.9 kg)   Physical Exam   Constitutional: She is oriented to person, place, and time. Vital signs are normal.  HENT:  Head: Normocephalic and atraumatic.  Mouth/Throat: Oropharynx is clear and moist and mucous membranes are normal.  Eyes: Pupils are equal, round, and reactive to light.  Neck: Normal range of motion.  Cardiovascular: Normal rate, regular rhythm and normal heart sounds.  No murmur heard. Pulmonary/Chest: Effort normal and breath sounds normal. She has no wheezes.  Abdominal: Soft. Normal appearance and bowel sounds are normal. She exhibits no distension. There is no tenderness.  Musculoskeletal: Normal range of motion. She exhibits no edema.  Neurological: She is alert and oriented to person, place, and time.  Skin: Skin is warm and dry. No rash noted.  Psychiatric: Judgment normal.     CMP Latest Ref Rng & Units 02/04/2018  Glucose 65 - 99 mg/dL 223(H)  BUN 6 - 20 mg/dL 15  Creatinine 0.44 - 1.00 mg/dL 0.92  Sodium 135 - 145 mmol/L 132(L)  Potassium 3.5 - 5.1 mmol/L 4.2  Chloride 101 - 111 mmol/L 101  CO2 22 - 32 mmol/L 21(L)  Calcium 8.9 - 10.3 mg/dL 8.7(L)  Total Protein 6.5 - 8.1 g/dL 6.9  Total Bilirubin 0.3 - 1.2 mg/dL 0.5  Alkaline Phos 38 - 126 U/L 63  AST 15 - 41 U/L 29  ALT 14 - 54 U/L 15   CBC Latest Ref Rng & Units 02/04/2018  WBC 3.6 - 11.0 K/uL 13.1(H)  Hemoglobin 12.0 - 16.0 g/dL 13.0  Hematocrit 35.0 - 47.0 % 38.4  Platelets 150 - 440 K/uL 193    No images are attached to the encounter.  Dg Chest 2 View  Result Date: 02/03/2018 CLINICAL DATA:  Worsening cough with congestion over the last week, chemotherapy 1 week ago for history of cervical carcinoma and leukemia, smoking history EXAM: CHEST - 2 VIEW COMPARISON:  CT chest of 01/15/2018 FINDINGS: No active infiltrate or effusion is seen. The mediastinal adenopathy noted on CT the chest is only faintly visualized on chest x-ray. There is mild peribronchial thickening  which may indicate bronchitis. Mediastinal and hilar  contours otherwise are unremarkable. The heart is within normal limits in size. No bony abnormality is seen. IMPRESSION: 1. No pneumonia or effusion.  Question bronchitis. 2. The mediastinal adenopathy noted by CT is not as well visualized by chest x-ray. Electronically Signed   By: Ivar Drape M.D.   On: 02/03/2018 12:03   Ct Soft Tissue Neck W Contrast  Result Date: 01/15/2018 CLINICAL DATA:  47 year old female with chronic lymphocytic leukemia. Restaging. EXAM: CT NECK WITH CONTRAST TECHNIQUE: Multidetector CT imaging of the neck was performed using the standard protocol following the bolus administration of intravenous contrast. CONTRAST:  159mL ISOVUE-300 IOPAMIDOL (ISOVUE-300) INJECTION 61% in conjunction with contrast enhanced imaging of the chest, abdomen, and pelvis reported separately. COMPARISON:  Brain MRI 01/01/2018.  Neck CT 10/01/2017 and earlier. FINDINGS: Pharynx and larynx: Glottis is closed as before. Laryngeal and pharyngeal soft tissue contours remain normal. Negative parapharyngeal and retropharyngeal spaces. Salivary glands: The sublingual space, submandibular glands, and parotid glands are stable and within normal limits. Thyroid: Stable and normal. Lymph nodes: Generalized cervical and upper thoracic lymphadenopathy. The largest nodes in the neck bilaterally are generally 1 millimeter increased compared to the January CT. For example, the largest left level 2 B lymph node on series 7, image 42 measures 11 millimeter short axis and was 8-9 millimeters previously. None of the nodal stations demonstrate decreased lymph node size or number. No cystic or necrotic nodes. Vascular: Major vascular structures in the neck and at the skull base remain patent. Limited intracranial: Negative. Visualized orbits: Negative. Mastoids and visualized paranasal sinuses: Clear. Skeleton: Negative. Upper chest: Reported separately today. IMPRESSION: 1. Stable to mild progression of CLL since January. Diffuse  cervical and upper thoracic lymphadenopathy with many of the largest nodes increased in size by about 1 mm since 10/01/2017. 2.  CT Chest, Abdomen, and Pelvis today are reported separately. Electronically Signed   By: Genevie Ann M.D.   On: 01/15/2018 13:41   Ct Chest W Contrast  Result Date: 01/15/2018 CLINICAL DATA:  Follow up of chronic lymphocytic leukemia. Restaging. EXAM: CT CHEST, ABDOMEN, AND PELVIS WITH CONTRAST TECHNIQUE: Multidetector CT imaging of the chest, abdomen and pelvis was performed following the standard protocol during bolus administration of intravenous contrast. CONTRAST:  136mL ISOVUE-300 IOPAMIDOL (ISOVUE-300) INJECTION 61% COMPARISON:  CT scan 10/01/2017 FINDINGS: CT CHEST FINDINGS Cardiovascular: Unremarkable Mediastinum/Nodes: Notable supraclavicular, axillary, subpectoral, paratracheal, bilateral hilar, right infrahilar, and subcarinal adenopathy. An index left axillary lymph node measures 2.0 cm in short axis on image 16/3, previously 1.7 cm. An index subpectoral lymph node measures 2.1 cm in short axis on image 10/3, previously 1.6 cm. Index right paratracheal node 1.4 cm in short axis on image 18/3, previously 1.1 cm. Subcarinal node 1.7 cm in short axis on image 25/3, formerly the same. Lungs/Pleura: Centrilobular emphysema. 5 by 4 mm right upper lobe subpleural nodule on image 36/5, stable compared to earliest available comparison of 01/21/2017. Mild subpleural nodularity along the minor fissure including a 0.6 by 0.5 cm nodule on image 74/5, stable. Additional small nodules in the right middle lobe and left lower lobe are stable compared to 01/21/2017. Musculoskeletal: Unremarkable CT ABDOMEN PELVIS FINDINGS Hepatobiliary: Unremarkable Pancreas: Unremarkable Spleen: There is a flat mass of soft tissue along the splenic hilum measuring 5.0 by 1.4 cm on image 56/3, previously 4.1 by 1.3 cm, and likely representing conglomerate adenopathy. This is hypodense relative to the splenic  parenchyma and is not thought to  represent an accessory spleen. Adrenals/Urinary Tract: Adrenal glands and kidneys appear normal. Displacement of the left ureter by the retroperitoneal mass. Stomach/Bowel: Prominent stool throughout the colon favors constipation. 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 There is some mild atherosclerotic calcification at the aortic bifurcation. Extensive adenopathy in the right gastric chain, porta hepatis and peripancreatic region, celiac and upper mesenteric region, retroperitoneum, omental, central mesentery, and along the iliac chains. A dominant left periaortic mass or node measures 4.8 cm in short axis, previously by my measurement 4.5 cm. A portacaval node measures 2.5 cm in short axis on image 64/3, formerly 2.3 cm. A left external iliac lymph node measures 3.2 cm in short axis on image 114/3, previously 2.9 cm. Reproductive: Unremarkable Other: No supplemental non-categorized findings. Musculoskeletal: Unremarkable IMPRESSION: 1. Progressive bulky adenopathy in the chest, abdomen, and pelvis, compatible with mild worsening of disease. 2. 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 3. Aortic Atherosclerosis (ICD10-I70.0) and Emphysema (ICD10-J43.9). 4. Stable small lung nodules, unchanged from earliest available comparison of 01/21/2017. Continued surveillance likely warranted. 5.  Prominent stool throughout the colon favors constipation. Electronically Signed   By: Van Clines M.D.   On: 01/15/2018 14:41   Ct Abdomen Pelvis W Contrast  Result Date: 01/15/2018 CLINICAL DATA:  Follow up of chronic lymphocytic leukemia. Restaging. EXAM: CT CHEST, ABDOMEN, AND PELVIS WITH CONTRAST TECHNIQUE: Multidetector CT imaging of the chest, abdomen and pelvis was performed following the standard protocol during bolus administration of intravenous  contrast. CONTRAST:  163mL ISOVUE-300 IOPAMIDOL (ISOVUE-300) INJECTION 61% COMPARISON:  CT scan 10/01/2017 FINDINGS: CT CHEST FINDINGS Cardiovascular: Unremarkable Mediastinum/Nodes: Notable supraclavicular, axillary, subpectoral, paratracheal, bilateral hilar, right infrahilar, and subcarinal adenopathy. An index left axillary lymph node measures 2.0 cm in short axis on image 16/3, previously 1.7 cm. An index subpectoral lymph node measures 2.1 cm in short axis on image 10/3, previously 1.6 cm. Index right paratracheal node 1.4 cm in short axis on image 18/3, previously 1.1 cm. Subcarinal node 1.7 cm in short axis on image 25/3, formerly the same. Lungs/Pleura: Centrilobular emphysema. 5 by 4 mm right upper lobe subpleural nodule on image 36/5, stable compared to earliest available comparison of 01/21/2017. Mild subpleural nodularity along the minor fissure including a 0.6 by 0.5 cm nodule on image 74/5, stable. Additional small nodules in the right middle lobe and left lower lobe are stable compared to 01/21/2017. Musculoskeletal: Unremarkable CT ABDOMEN PELVIS FINDINGS Hepatobiliary: Unremarkable Pancreas: Unremarkable Spleen: There is a flat mass of soft tissue along the splenic hilum measuring 5.0 by 1.4 cm on image 56/3, previously 4.1 by 1.3 cm, and likely representing conglomerate adenopathy. This is hypodense relative to the splenic parenchyma and is not thought to represent an accessory spleen. Adrenals/Urinary Tract: Adrenal glands and kidneys appear normal. Displacement of the left ureter by the retroperitoneal mass. Stomach/Bowel: Prominent stool throughout the colon favors constipation. 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 There is some mild atherosclerotic calcification at the aortic bifurcation. Extensive adenopathy in the right gastric chain, porta hepatis and peripancreatic region, celiac and upper  mesenteric region, retroperitoneum, omental, central mesentery, and along the iliac chains. A dominant left periaortic mass or node measures 4.8 cm in short axis, previously by my measurement 4.5 cm. A portacaval node measures 2.5 cm in short axis on image 64/3, formerly 2.3 cm. A left external iliac lymph node measures  3.2 cm in short axis on image 114/3, previously 2.9 cm. Reproductive: Unremarkable Other: No supplemental non-categorized findings. Musculoskeletal: Unremarkable IMPRESSION: 1. Progressive bulky adenopathy in the chest, abdomen, and pelvis, compatible with mild worsening of disease. 2. 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 3. Aortic Atherosclerosis (ICD10-I70.0) and Emphysema (ICD10-J43.9). 4. Stable small lung nodules, unchanged from earliest available comparison of 01/21/2017. Continued surveillance likely warranted. 5.  Prominent stool throughout the colon favors constipation. Electronically Signed   By: Van Clines M.D.   On: 01/15/2018 14:41   US Venous Img Upper Uni Right  Result Date: 01/31/2018 CLINICAL DATA:  Right arm pain, swelling EXAM: RIGHT UPPER EXTREMITY VENOUS DOPPLER ULTRASOUND TECHNIQUE: Gray-scale sonography with graded compression, as well as color Doppler and duplex ultrasound were performed to evaluate the upper extremity deep venous system from the level of the subclavian vein and including the jugular, axillary, basilic, radial, ulnar and upper cephalic vein. Spectral Doppler was utilized to evaluate flow at rest and with distal augmentation maneuvers. COMPARISON:  None. FINDINGS: Contralateral Subclavian Vein: Respiratory phasicity is normal and symmetric with the symptomatic side. No evidence of thrombus. Normal compressibility. Internal Jugular Vein: No evidence of thrombus. Normal compressibility, respiratory phasicity and response to augmentation. Subclavian Vein: No evidence of thrombus. Normal compressibility,  respiratory phasicity and response to augmentation. Axillary Vein: No evidence of thrombus. Normal compressibility, respiratory phasicity and response to augmentation. Cephalic Vein: No evidence of thrombus. Normal compressibility, respiratory phasicity and response to augmentation. Basilic Vein: No evidence of thrombus. Normal compressibility, respiratory phasicity and response to augmentation. Brachial Veins: No evidence of thrombus. Normal compressibility, respiratory phasicity and response to augmentation. Radial Veins: No evidence of thrombus. Normal compressibility, respiratory phasicity and response to augmentation. Ulnar Veins: No evidence of thrombus. Normal compressibility, respiratory phasicity and response to augmentation. Venous Reflux:  None visualized. Other Findings:  None visualized. IMPRESSION: No evidence of DVT within the  upper extremity. Electronically Signed   By: Rolm Baptise M.D.   On: 01/31/2018 18:20     Assessment and plan- Patient is a 47 y.o. female who presents for dry cough and  laryngitis  X 2 days.   1. CLL: S/p Cycle 1 of Rituxan and Treanda. (5/15-5/16).  Tolerated well.  Scheduled to return to clinic for consideration of cycle 2 on 6/12 and 6/13 with labs and MD assessment.  2.  Bronchitis/laryngitis: Recently diagnosed by chest x-ray on 02/03/18.  Treated with oral steroids, albuterol inhaler and Tussionex for dry cough.  Cough initially got better with the use of Tussionex but unfortunately she has run out.  Recently complains of laryngitis.  Complains of sore throat and continued dry cough.  Denies reflux symptoms. Denies shortness of breath.  Per up-to-date, typically laryngitis and bronchitis are viral. Patient is immunocompromised and on current treatment. Recently treated with a course of steroids without improvement of symptoms. RX Levaquin 500 mg daily for 7 days and refill Tussionex for cough.  Patient does not have any allergies and kidney function is baseline  and normal.  Patient to call with worsening symptoms.  Encourage salt water gargles to help with sore throat and Tylenol PRN.  Patient in agreement with plan.   Visit Diagnosis 1. Laryngitis   2. Cough   3. Bronchitis     Patient expressed understanding and was in agreement with this plan. She also understands that She can call clinic at any time with any questions, concerns, or complaints.   Greater than 50%  was spent in counseling and coordination of care with this patient including but not limited to discussion of the relevant topics above (See A&P) including, but not limited to diagnosis and management of acute and chronic medical conditions.    Faythe Casa, AGNP-C [facility] [place]- [phone] Pager- [phone] 02/11/2018 12:22 PM

## 2018-02-18 ENCOUNTER — Ambulatory Visit: Payer: BLUE CROSS/BLUE SHIELD

## 2018-02-18 ENCOUNTER — Other Ambulatory Visit: Payer: BLUE CROSS/BLUE SHIELD

## 2018-02-18 ENCOUNTER — Ambulatory Visit: Payer: BLUE CROSS/BLUE SHIELD | Admitting: Oncology

## 2018-02-19 ENCOUNTER — Ambulatory Visit: Payer: BLUE CROSS/BLUE SHIELD

## 2018-02-20 ENCOUNTER — Telehealth: Payer: Self-pay

## 2018-02-20 DIAGNOSIS — M7061 Trochanteric bursitis, right hip: Secondary | ICD-10-CM | POA: Diagnosis not present

## 2018-02-20 NOTE — Telephone Encounter (Signed)
Please ask if she would like to go to [facility] and place referral if she is good with that.

## 2018-02-20 NOTE — Telephone Encounter (Signed)
Called patient to ask about [facility] referral and she states the pain is so severe she is going to the walk in for [facility] to be seen.

## 2018-02-20 NOTE — Telephone Encounter (Signed)
Copied from [place] [phone]. Topic: Inquiry >> Feb 20, 2018  8:02 AM Scherrie Gerlach wrote: Reason for CRM: pt states the bursitis in her right hip and groin area is hurting very bad.  Pt state she was crying trying to put on her clothes.  Pt states she is not going to [facility] anymore and wants to know if Dr Ancil Boozer can do an injection in her hip, or refer her to someone else.

## 2018-02-22 NOTE — Progress Notes (Signed)
[place]  [phone] Fax:([phone]  ID: Monique [name] OB: Dec 04, 1970  MR#: [id number]  DGL#:[id number]  Patient Care Team: [name], MD as PCP - General (Family Medicine)  CHIEF COMPLAINT: CLL.  INTERVAL HISTORY: Patient returns to clinic today for further evaluation and consideration of cycle 2, day 1 of Rituxan and Treanda.  She currently feels well and is at her baseline.  She continues to work full-time.  Her lymphadenopathy has nearly resolved. She continues to be anxious.  She has no neurologic complaints. She denies any fevers, illnesses, or weight loss. She has no chest pain or shortness of breath. She denies any nausea, vomiting, constipation, or diarrhea. She has no urinary complaints.  Patient offers no specific complaints today.  REVIEW OF SYSTEMS:   Review of Systems  Constitutional: Negative.  Negative for fever, malaise/fatigue and weight loss.  HENT: Negative.  Negative for congestion.   Respiratory: Negative.  Negative for cough and shortness of breath.   Cardiovascular: Negative.  Negative for chest pain and leg swelling.  Gastrointestinal: Negative.  Negative for abdominal pain and constipation.  Genitourinary: Negative.  Negative for dysuria.  Musculoskeletal: Negative.  Negative for myalgias and neck pain.  Skin: Negative.  Negative for rash.  Neurological: Negative.  Negative for tingling, sensory change, focal weakness and weakness.  Psychiatric/Behavioral: The patient is nervous/anxious.     As per HPI. Otherwise, a complete review of systems is negative.  PAST MEDICAL HISTORY: Past Medical History:  Diagnosis Date  . Anxiety   . Cervical cancer ([place])    hx LEEP over 18 years ago.   . Cervical intraepithelial neoplasia I   . Chronic lymphocytic leukemia ([place])   . Depression   . Eating disorder   . History of self-harm   . Insomnia   . Obsession   . Pap smear abnormality of cervix with LGSIL   .  Tobacco abuse   . Vitamin B12 deficiency (non anemic)     PAST SURGICAL HISTORY: Past Surgical History:  Procedure Laterality Date  . BREAST BIOPSY Right 01/05/2018   US guided biopsy of 2 areas and 1 lymph node  . CERVICAL BIOPSY  W/ LOOP ELECTRODE EXCISION    . TUBAL LIGATION      FAMILY HISTORY: Family History  Problem Relation Age of Onset  . Cancer Mother        thyroid  . Alcohol abuse Father   . Depression Sister   . Cancer Sister        cevical  . Alcohol abuse Brother   . Depression Brother   . Bipolar disorder Brother   . ADD / ADHD Son   . Breast cancer Neg Hx     ADVANCED DIRECTIVES (Y/N):  N  HEALTH MAINTENANCE: Social History   Tobacco Use  . Smoking status: Current Every Day Smoker    Packs/day: 1.00    Years: 31.00    Pack years: 31.00    Start date: 09/25/1986  . Smokeless tobacco: Never Used  . Tobacco comment: Would like to discuss Chantix- she forgot to take this medication  Substance Use Topics  . Alcohol use: Yes    Alcohol/week: 0.0 oz    Comment: rarely  . Drug use: No     Colonoscopy:  PAP:  Bone density:  Lipid panel:  No Known Allergies  Current Outpatient Medications  Medication Sig Dispense Refill  . Albuterol Sulfate (PROAIR RESPICLICK) 518 (90 Base) MCG/ACT AEPB Inhale 1 puff into  the lungs 3 (three) times daily as needed. 1 each 0  . carbamazepine (TEGRETOL) 200 MG tablet TAKE 1 TABLET (200 MG TOTAL) BY MOUTH 3 (THREE) TIMES DAILY. 90 tablet 0  . chlorpheniramine-HYDROcodone (TUSSIONEX) 10-8 MG/5ML SUER Take 5 mLs by mouth 2 (two) times daily. 140 mL 0  . escitalopram (LEXAPRO) 10 MG tablet     . gabapentin (NEURONTIN) 100 MG capsule Take 100 mg twice a day for one week, then increase to 200 mg(2 tablets) twice a day and continue    . ondansetron (ZOFRAN) 8 MG tablet Take 1 tablet (8 mg total) by mouth 2 (two) times daily as needed for refractory nausea / vomiting. 30 tablet 2  . prochlorperazine (COMPAZINE) 10 MG tablet  Take 1 tablet (10 mg total) by mouth every 6 (six) hours as needed (Nausea or vomiting). 60 tablet 2  . traMADol (ULTRAM) 50 MG tablet Take by mouth every 6 (six) hours as needed.    . umeclidinium-vilanterol (ANORO ELLIPTA) 62.5-25 MCG/INH AEPB TAKE 1 PUFF BY MOUTH EVERY DAY 60 each 5   No current facility-administered medications for this visit.     OBJECTIVE: Vitals:   02/25/18 0851  BP: (!) 97/59  Pulse: 84  Resp: 20  Temp: 97.8 F (36.6 C)     Body mass index is 23.74 kg/m.    ECOG FS:0 - Asymptomatic  General: Well-developed, well-nourished, no acute distress. Eyes: Pink conjunctiva, anicteric sclera. HEENT: Normocephalic, moist mucous membranes, clear oropharnyx.  Lymphadenopathy resolved.  Lungs: Clear to auscultation bilaterally. Heart: Regular rate and rhythm. No rubs, murmurs, or gallops. Abdomen: Soft, nontender, nondistended. No organomegaly noted, normoactive bowel sounds. Musculoskeletal: No edema, cyanosis, or clubbing. Neuro: Alert, answering all questions appropriately. Cranial nerves grossly intact. Skin: No rashes or petechiae noted. Psych: Normal affect. Lymphatics: No cervical, calvicular, axillary or inguinal LAD.  LAB RESULTS:  Lab Results  Component Value Date   NA 138 02/25/2018   K 4.0 02/25/2018   CL 106 02/25/2018   CO2 22 02/25/2018   GLUCOSE 103 (H) 02/25/2018   BUN 16 02/25/2018   CREATININE 0.78 02/25/2018   CALCIUM 8.9 02/25/2018   PROT 6.8 02/25/2018   ALBUMIN 3.8 02/25/2018   AST 21 02/25/2018   ALT 15 02/25/2018   ALKPHOS 59 02/25/2018   BILITOT 0.6 02/25/2018   GFRNONAA >60 02/25/2018   GFRAA >60 02/25/2018    Lab Results  Component Value Date   WBC 13.3 (H) 02/25/2018   NEUTROABS 3.8 02/25/2018   HGB 13.7 02/25/2018   HCT 39.8 02/25/2018   MCV 97.8 02/25/2018   PLT 169 02/25/2018     STUDIES: Dg Chest 2 View  Result Date: 02/03/2018 CLINICAL DATA:  Worsening cough with congestion over the last week, chemotherapy  1 week ago for history of cervical carcinoma and leukemia, smoking history EXAM: CHEST - 2 VIEW COMPARISON:  CT chest of 01/15/2018 FINDINGS: No active infiltrate or effusion is seen. The mediastinal adenopathy noted on CT the chest is only faintly visualized on chest x-ray. There is mild peribronchial thickening which may indicate bronchitis. Mediastinal and hilar contours otherwise are unremarkable. The heart is within normal limits in size. No bony abnormality is seen. IMPRESSION: 1. No pneumonia or effusion.  Question bronchitis. 2. The mediastinal adenopathy noted by CT is not as well visualized by chest x-ray. Electronically Signed   By: Ivar Drape M.D.   On: 02/03/2018 12:03   US Venous Img Upper Uni Right  Result Date: 01/31/2018 CLINICAL  DATA:  Right arm pain, swelling EXAM: RIGHT UPPER EXTREMITY VENOUS DOPPLER ULTRASOUND TECHNIQUE: Gray-scale sonography with graded compression, as well as color Doppler and duplex ultrasound were performed to evaluate the upper extremity deep venous system from the level of the subclavian vein and including the jugular, axillary, basilic, radial, ulnar and upper cephalic vein. Spectral Doppler was utilized to evaluate flow at rest and with distal augmentation maneuvers. COMPARISON:  None. FINDINGS: Contralateral Subclavian Vein: Respiratory phasicity is normal and symmetric with the symptomatic side. No evidence of thrombus. Normal compressibility. Internal Jugular Vein: No evidence of thrombus. Normal compressibility, respiratory phasicity and response to augmentation. Subclavian Vein: No evidence of thrombus. Normal compressibility, respiratory phasicity and response to augmentation. Axillary Vein: No evidence of thrombus. Normal compressibility, respiratory phasicity and response to augmentation. Cephalic Vein: No evidence of thrombus. Normal compressibility, respiratory phasicity and response to augmentation. Basilic Vein: No evidence of thrombus. Normal  compressibility, respiratory phasicity and response to augmentation. Brachial Veins: No evidence of thrombus. Normal compressibility, respiratory phasicity and response to augmentation. Radial Veins: No evidence of thrombus. Normal compressibility, respiratory phasicity and response to augmentation. Ulnar Veins: No evidence of thrombus. Normal compressibility, respiratory phasicity and response to augmentation. Venous Reflux:  None visualized. Other Findings:  None visualized. IMPRESSION: No evidence of DVT within the  upper extremity. Electronically Signed   By: Rolm Baptise M.D.   On: 01/31/2018 18:20    ASSESSMENT: CLL, Rai stage 1, breast mass  PLAN:    1. CLL: Confirmed by peripheral blood flow cytometry.  CT scans from Jan 15, 2018 reviewed independently confirming progression of disease.  Proceed with cycle 2, day 1 of Rituxan and Treanda today.  Return to clinic tomorrow for [place] only and then in 4 weeks for further evaluation and consideration of cycle 3, day 1.  Plan to reimage at the conclusion of cycle 3.  2. Lymphadenopathy: Nearly resolved.  Treatment as above 3. Leukocytosis: Improving. Secondary to CLL.  Treatment as above. 4.  Breast mass: Previous biopsy results reported benign disease.  Monitor.  Approximately 30 minutes was spent in discussion of which greater than 50% was consultation.  Patient expressed understanding and was in agreement with this plan. She also understands that She can call clinic at any time with any questions, concerns, or complaints.    Lloyd Huger, MD   02/28/2018 7:17 AM

## 2018-02-25 ENCOUNTER — Encounter: Payer: Self-pay | Admitting: Oncology

## 2018-02-25 ENCOUNTER — Inpatient Hospital Stay: Payer: BLUE CROSS/BLUE SHIELD

## 2018-02-25 ENCOUNTER — Inpatient Hospital Stay: Payer: BLUE CROSS/BLUE SHIELD | Attending: Oncology | Admitting: Oncology

## 2018-02-25 ENCOUNTER — Other Ambulatory Visit: Payer: Self-pay

## 2018-02-25 VITALS — BP 114/72 | HR 69

## 2018-02-25 VITALS — BP 97/59 | HR 84 | Temp 97.8°F | Resp 20 | Wt 151.6 lb

## 2018-02-25 DIAGNOSIS — C9110 Chronic lymphocytic leukemia of B-cell type not having achieved remission: Secondary | ICD-10-CM | POA: Diagnosis not present

## 2018-02-25 DIAGNOSIS — Z5112 Encounter for antineoplastic immunotherapy: Secondary | ICD-10-CM | POA: Insufficient documentation

## 2018-02-25 DIAGNOSIS — Z5111 Encounter for antineoplastic chemotherapy: Secondary | ICD-10-CM | POA: Diagnosis not present

## 2018-02-25 DIAGNOSIS — Z720 Tobacco use: Secondary | ICD-10-CM | POA: Diagnosis not present

## 2018-02-25 LAB — COMPREHENSIVE METABOLIC PANEL
ALK PHOS: 59 U/L (ref 38–126)
ALT: 15 U/L (ref 14–54)
ANION GAP: 10 (ref 5–15)
AST: 21 U/L (ref 15–41)
Albumin: 3.8 g/dL (ref 3.5–5.0)
BUN: 16 mg/dL (ref 6–20)
CO2: 22 mmol/L (ref 22–32)
Calcium: 8.9 mg/dL (ref 8.9–10.3)
Chloride: 106 mmol/L (ref 101–111)
Creatinine, Ser: 0.78 mg/dL (ref 0.44–1.00)
Glucose, Bld: 103 mg/dL — ABNORMAL HIGH (ref 65–99)
Potassium: 4 mmol/L (ref 3.5–5.1)
SODIUM: 138 mmol/L (ref 135–145)
Total Bilirubin: 0.6 mg/dL (ref 0.3–1.2)
Total Protein: 6.8 g/dL (ref 6.5–8.1)

## 2018-02-25 LAB — CBC WITH DIFFERENTIAL/PLATELET
BASOS ABS: 0.1 10*3/uL (ref 0–0.1)
Basophils Relative: 0 %
EOS ABS: 0.1 10*3/uL (ref 0–0.7)
Eosinophils Relative: 1 %
HEMATOCRIT: 39.8 % (ref 35.0–47.0)
HEMOGLOBIN: 13.7 g/dL (ref 12.0–16.0)
Lymphocytes Relative: 67 %
Lymphs Abs: 8.8 10*3/uL — ABNORMAL HIGH (ref 1.0–3.6)
MCH: 33.6 pg (ref 26.0–34.0)
MCHC: 34.4 g/dL (ref 32.0–36.0)
MCV: 97.8 fL (ref 80.0–100.0)
MONOS PCT: 4 %
Monocytes Absolute: 0.6 10*3/uL (ref 0.2–0.9)
NEUTROS ABS: 3.8 10*3/uL (ref 1.4–6.5)
NEUTROS PCT: 28 %
Platelets: 169 10*3/uL (ref 150–440)
RBC: 4.07 MIL/uL (ref 3.80–5.20)
RDW: 13.6 % (ref 11.5–14.5)
WBC: 13.3 10*3/uL — AB (ref 3.6–11.0)

## 2018-02-25 MED ORDER — PALONOSETRON HCL INJECTION 0.25 MG/5ML
0.2500 mg | Freq: Once | INTRAVENOUS | Status: AC
  Administered 2018-02-25: 0.25 mg via INTRAVENOUS

## 2018-02-25 MED ORDER — RITUXIMAB CHEMO INJECTION 500 MG/50ML
375.0000 mg/m2 | Freq: Once | INTRAVENOUS | Status: AC
  Administered 2018-02-25: 700 mg via INTRAVENOUS
  Filled 2018-02-25: qty 50

## 2018-02-25 MED ORDER — DEXAMETHASONE SODIUM PHOSPHATE 10 MG/ML IJ SOLN
10.0000 mg | Freq: Once | INTRAMUSCULAR | Status: AC
  Administered 2018-02-25: 10 mg via INTRAVENOUS
  Filled 2018-02-25: qty 1

## 2018-02-25 MED ORDER — SODIUM CHLORIDE 0.9 % IV SOLN: 375.0000 mg/m2 | Freq: Once | INTRAVENOUS | Status: DC

## 2018-02-25 MED ORDER — DIPHENHYDRAMINE HCL 25 MG PO CAPS
25.0000 mg | ORAL_CAPSULE | Freq: Once | ORAL | Status: AC
  Administered 2018-02-25: 25 mg via ORAL
  Filled 2018-02-25: qty 1

## 2018-02-25 MED ORDER — SODIUM CHLORIDE 0.9 % IV SOLN
90.0000 mg/m2 | Freq: Once | INTRAVENOUS | Status: AC
  Administered 2018-02-25: 150 mg via INTRAVENOUS
  Filled 2018-02-25: qty 6

## 2018-02-25 MED ORDER — SODIUM CHLORIDE 0.9 % IV SOLN
Freq: Once | INTRAVENOUS | Status: AC
  Administered 2018-02-25: 10:00:00 via INTRAVENOUS
  Filled 2018-02-25: qty 1000

## 2018-02-25 MED ORDER — DIPHENHYDRAMINE HCL 50 MG/ML IJ SOLN
25.0000 mg | Freq: Once | INTRAMUSCULAR | Status: AC | PRN
  Administered 2018-02-25: 25 mg via INTRAVENOUS

## 2018-02-25 MED ORDER — ACETAMINOPHEN 325 MG PO TABS
650.0000 mg | ORAL_TABLET | Freq: Once | ORAL | Status: AC
  Administered 2018-02-25: 650 mg via ORAL
  Filled 2018-02-25: qty 2

## 2018-02-25 NOTE — Progress Notes (Signed)
Patient denies any concerns today.  

## 2018-02-25 NOTE — Progress Notes (Signed)
Rituxan infusion started at 1005.  Around 1050, pt reported itching noted in her ears and throat as well as a runny nose. Mild redness noted to bilateral cheeks as well.  Infusion stopped, NS started.  Pt given benadryl 25 mg IV per policy.  Pt reports feeling better once benadryl was in her system.  IVF continued until pt returned to baseline.  [name] informed of mild reaction.  Pt tolerated the remainder of her infusion with no complications.

## 2018-02-26 ENCOUNTER — Inpatient Hospital Stay: Payer: BLUE CROSS/BLUE SHIELD

## 2018-02-26 DIAGNOSIS — Z5111 Encounter for antineoplastic chemotherapy: Secondary | ICD-10-CM | POA: Diagnosis not present

## 2018-02-26 DIAGNOSIS — C9110 Chronic lymphocytic leukemia of B-cell type not having achieved remission: Secondary | ICD-10-CM

## 2018-02-26 DIAGNOSIS — Z5112 Encounter for antineoplastic immunotherapy: Secondary | ICD-10-CM | POA: Diagnosis not present

## 2018-02-26 MED ORDER — SODIUM CHLORIDE 0.9 % IV SOLN: 10.0000 mg | Freq: Once | INTRAVENOUS | Status: DC

## 2018-02-26 MED ORDER — SODIUM CHLORIDE 0.9 % IV SOLN
Freq: Once | INTRAVENOUS | Status: AC
  Administered 2018-02-26: 14:00:00 via INTRAVENOUS
  Filled 2018-02-26: qty 1000

## 2018-02-26 MED ORDER — DEXAMETHASONE SODIUM PHOSPHATE 10 MG/ML IJ SOLN
10.0000 mg | Freq: Once | INTRAMUSCULAR | Status: AC
  Administered 2018-02-26: 10 mg via INTRAVENOUS
  Filled 2018-02-26: qty 1

## 2018-02-26 MED ORDER — SODIUM CHLORIDE 0.9 % IV SOLN
90.0000 mg/m2 | Freq: Once | INTRAVENOUS | Status: AC
  Administered 2018-02-26: 150 mg via INTRAVENOUS
  Filled 2018-02-26: qty 6

## 2018-03-02 ENCOUNTER — Telehealth: Payer: Self-pay | Admitting: *Deleted

## 2018-03-02 NOTE — Telephone Encounter (Signed)
Per Lorretta Harp, NP if painful and swollen, may need antibiotics, if not she can try warm compresses and be seen in clinic tomorrow. I called patient she states her arm is painful and may be a little swollen on one side of her wrist. She will use warm moist compresses tonight and call me tomorrow if not any better

## 2018-03-02 NOTE — Telephone Encounter (Signed)
Patient called and reports that she thinks she is having a reaction to her chemotherapy from last week. She has red blotches and red streak down her left wrist from her IV site, feels like her tendons are pulling in left arm Treatment was given in left arm both days. Redness started today around 1030 ish after she was working.using her arms a lot. Please advise

## 2018-03-04 DIAGNOSIS — M546 Pain in thoracic spine: Secondary | ICD-10-CM | POA: Diagnosis not present

## 2018-03-04 DIAGNOSIS — M545 Low back pain: Secondary | ICD-10-CM | POA: Diagnosis not present

## 2018-03-22 ENCOUNTER — Other Ambulatory Visit: Payer: Self-pay | Admitting: Oncology

## 2018-03-22 DIAGNOSIS — C9110 Chronic lymphocytic leukemia of B-cell type not having achieved remission: Secondary | ICD-10-CM

## 2018-03-22 NOTE — Progress Notes (Signed)
[place]  Telephone:(336) [phone] Fax:(336) [phone]  ID: Monique [name] OB: 12-20-1970  MR#: [id number]  NGE#:[id number]  Patient Care Team: [name], MD as PCP - General (Family Medicine)  CHIEF COMPLAINT: CLL.  INTERVAL HISTORY: Patient returns to clinic today for further evaluation and consideration of cycle 3, day 1 of Rituxan and Treanda.  She complains of continued breast tenderness at the site of her previous biopsy, but otherwise feels well.  She continues to be active and work full-time. She continues to be anxious.  She has no neurologic complaints. She denies any fevers, illnesses, or weight loss. She has no chest pain or shortness of breath. She denies any nausea, vomiting, constipation, or diarrhea. She has no urinary complaints.  Patient offers no further specific complaints today.  REVIEW OF SYSTEMS:   Review of Systems  Constitutional: Negative.  Negative for fever, malaise/fatigue and weight loss.  HENT: Negative.  Negative for congestion.   Respiratory: Negative.  Negative for cough and shortness of breath.   Cardiovascular: Negative.  Negative for chest pain and leg swelling.  Gastrointestinal: Negative.  Negative for abdominal pain and constipation.  Genitourinary: Negative.  Negative for dysuria.  Musculoskeletal: Negative.  Negative for myalgias and neck pain.  Skin: Negative.  Negative for rash.  Neurological: Negative.  Negative for tingling, sensory change, focal weakness and weakness.  Psychiatric/Behavioral: The patient is nervous/anxious.     As per HPI. Otherwise, a complete review of systems is negative.  PAST MEDICAL HISTORY: Past Medical History:  Diagnosis Date  . Anxiety   . Cervical cancer ([place])    hx LEEP over 18 years ago.   . Cervical intraepithelial neoplasia I   . Chronic lymphocytic leukemia ([place])   . Depression   . Eating disorder   . History of self-harm   . Insomnia   . Obsession   . Pap smear  abnormality of cervix with LGSIL   . Tobacco abuse   . Vitamin B12 deficiency (non anemic)     PAST SURGICAL HISTORY: Past Surgical History:  Procedure Laterality Date  . BREAST BIOPSY Right 01/05/2018   US guided biopsy of 2 areas and 1 lymph node  . CERVICAL BIOPSY  W/ LOOP ELECTRODE EXCISION    . TUBAL LIGATION      FAMILY HISTORY: Family History  Problem Relation Age of Onset  . Cancer Mother        thyroid  . Alcohol abuse Father   . Depression Sister   . Cancer Sister        cevical  . Alcohol abuse Brother   . Depression Brother   . Bipolar disorder Brother   . ADD / ADHD Son   . Breast cancer Neg Hx     ADVANCED DIRECTIVES (Y/N):  N  HEALTH MAINTENANCE: Social History   Tobacco Use  . Smoking status: Current Every Day Smoker    Packs/day: 1.00    Years: 31.00    Pack years: 31.00    Start date: 09/25/1986  . Smokeless tobacco: Never Used  . Tobacco comment: Would like to discuss Chantix- she forgot to take this medication  Substance Use Topics  . Alcohol use: Yes    Alcohol/week: 0.0 oz    Comment: rarely  . Drug use: No     Colonoscopy:  PAP:  Bone density:  Lipid panel:  No Known Allergies  Current Outpatient Medications  Medication Sig Dispense Refill  . Albuterol Sulfate (PROAIR RESPICLICK) 401 (90 Base)  MCG/ACT AEPB Inhale 1 puff into the lungs 3 (three) times daily as needed. 1 each 0  . carbamazepine (TEGRETOL) 200 MG tablet TAKE 1 TABLET (200 MG TOTAL) BY MOUTH 3 (THREE) TIMES DAILY. 90 tablet 0  . chlorpheniramine-HYDROcodone (TUSSIONEX) 10-8 MG/5ML SUER Take 5 mLs by mouth 2 (two) times daily. 140 mL 0  . escitalopram (LEXAPRO) 10 MG tablet     . gabapentin (NEURONTIN) 100 MG capsule Take 100 mg twice a day for one week, then increase to 200 mg(2 tablets) twice a day and continue    . ondansetron (ZOFRAN) 8 MG tablet Take 1 tablet (8 mg total) by mouth 2 (two) times daily as needed for refractory nausea / vomiting. 30 tablet 2  .  prochlorperazine (COMPAZINE) 10 MG tablet Take 1 tablet (10 mg total) by mouth every 6 (six) hours as needed (Nausea or vomiting). 60 tablet 2  . traMADol (ULTRAM) 50 MG tablet Take by mouth every 6 (six) hours as needed.    . umeclidinium-vilanterol (ANORO ELLIPTA) 62.5-25 MCG/INH AEPB TAKE 1 PUFF BY MOUTH EVERY DAY 60 each 5   No current facility-administered medications for this visit.     OBJECTIVE: Vitals:   03/25/18 0941  BP: 106/71  Pulse: 80  Resp: 16  Temp: (!) 97.1 F (36.2 C)     Body mass index is 23.18 kg/m.    ECOG FS:0 - Asymptomatic  General: Well-developed, well-nourished, no acute distress. Eyes: Pink conjunctiva, anicteric sclera. HEENT: Normocephalic, moist mucous membranes, clear oropharnyx.  Lymphadenopathy resolved. Lungs: Clear to auscultation bilaterally. Heart: Regular rate and rhythm. No rubs, murmurs, or gallops. Abdomen: Soft, nontender, nondistended. No organomegaly noted, normoactive bowel sounds. Musculoskeletal: No edema, cyanosis, or clubbing. Neuro: Alert, answering all questions appropriately. Cranial nerves grossly intact. Skin: No rashes or petechiae noted. Psych: Normal affect. Lymphatics: No cervical, calvicular, axillary or inguinal LAD.  LAB RESULTS:  Lab Results  Component Value Date   NA 137 03/25/2018   K 4.6 03/25/2018   CL 106 03/25/2018   CO2 24 03/25/2018   GLUCOSE 89 03/25/2018   BUN 13 03/25/2018   CREATININE 0.87 03/25/2018   CALCIUM 9.1 03/25/2018   PROT 6.9 03/25/2018   ALBUMIN 4.2 03/25/2018   AST 20 03/25/2018   ALT 16 03/25/2018   ALKPHOS 51 03/25/2018   BILITOT 1.0 03/25/2018   GFRNONAA >60 03/25/2018   GFRAA >60 03/25/2018    Lab Results  Component Value Date   WBC 8.1 03/25/2018   NEUTROABS 3.3 03/25/2018   HGB 14.3 03/25/2018   HCT 41.5 03/25/2018   MCV 98.9 03/25/2018   PLT 160 03/25/2018     STUDIES: No results found.  ASSESSMENT: CLL, Rai stage 1, breast mass  PLAN:    1. CLL:  Confirmed by peripheral blood flow cytometry.  CT scans from Jan 15, 2018 reviewed independently confirming progression of disease.  Proceed with cycle 3, day 1 of Rituxan and Treanda today.  Return to clinic tomorrow for [place] only.  Patient will then return to clinic in 6 weeks with restaging CT scan and further evaluation. 2. Lymphadenopathy: Resolved.  Proceed with cycle 3 as above. 3. Leukocytosis: Resolved. 4.  Breast tenderness: Previous biopsy and mammogram are reported as negative.   Patient expressed understanding and was in agreement with this plan. She also understands that She can call clinic at any time with any questions, concerns, or complaints.    Lloyd Huger, MD   03/27/2018 1:51 PM

## 2018-03-25 ENCOUNTER — Inpatient Hospital Stay (HOSPITAL_BASED_OUTPATIENT_CLINIC_OR_DEPARTMENT_OTHER): Payer: BLUE CROSS/BLUE SHIELD | Admitting: Oncology

## 2018-03-25 ENCOUNTER — Encounter: Payer: Self-pay | Admitting: Oncology

## 2018-03-25 ENCOUNTER — Inpatient Hospital Stay: Payer: BLUE CROSS/BLUE SHIELD | Attending: Oncology

## 2018-03-25 ENCOUNTER — Inpatient Hospital Stay: Payer: BLUE CROSS/BLUE SHIELD

## 2018-03-25 VITALS — BP 106/71 | HR 80 | Temp 97.1°F | Resp 16 | Wt 148.0 lb

## 2018-03-25 DIAGNOSIS — Z720 Tobacco use: Secondary | ICD-10-CM

## 2018-03-25 DIAGNOSIS — Z5111 Encounter for antineoplastic chemotherapy: Secondary | ICD-10-CM | POA: Insufficient documentation

## 2018-03-25 DIAGNOSIS — Z5112 Encounter for antineoplastic immunotherapy: Secondary | ICD-10-CM | POA: Diagnosis not present

## 2018-03-25 DIAGNOSIS — N644 Mastodynia: Secondary | ICD-10-CM | POA: Diagnosis not present

## 2018-03-25 DIAGNOSIS — C9110 Chronic lymphocytic leukemia of B-cell type not having achieved remission: Secondary | ICD-10-CM

## 2018-03-25 LAB — CBC WITH DIFFERENTIAL/PLATELET
BASOS PCT: 0 %
Basophils Absolute: 0 10*3/uL (ref 0–0.1)
EOS ABS: 0 10*3/uL (ref 0–0.7)
EOS PCT: 1 %
HCT: 41.5 % (ref 35.0–47.0)
Hemoglobin: 14.3 g/dL (ref 12.0–16.0)
LYMPHS PCT: 52 %
Lymphs Abs: 4.2 10*3/uL — ABNORMAL HIGH (ref 1.0–3.6)
MCH: 34 pg (ref 26.0–34.0)
MCHC: 34.4 g/dL (ref 32.0–36.0)
MCV: 98.9 fL (ref 80.0–100.0)
MONO ABS: 0.5 10*3/uL (ref 0.2–0.9)
Monocytes Relative: 6 %
Neutro Abs: 3.3 10*3/uL (ref 1.4–6.5)
Neutrophils Relative %: 41 %
PLATELETS: 160 10*3/uL (ref 150–440)
RBC: 4.2 MIL/uL (ref 3.80–5.20)
RDW: 13.2 % (ref 11.5–14.5)
WBC: 8.1 10*3/uL (ref 3.6–11.0)

## 2018-03-25 LAB — COMPREHENSIVE METABOLIC PANEL
ALK PHOS: 51 U/L (ref 38–126)
ALT: 16 U/L (ref 0–44)
AST: 20 U/L (ref 15–41)
Albumin: 4.2 g/dL (ref 3.5–5.0)
Anion gap: 7 (ref 5–15)
BILIRUBIN TOTAL: 1 mg/dL (ref 0.3–1.2)
BUN: 13 mg/dL (ref 6–20)
CALCIUM: 9.1 mg/dL (ref 8.9–10.3)
CO2: 24 mmol/L (ref 22–32)
CREATININE: 0.87 mg/dL (ref 0.44–1.00)
Chloride: 106 mmol/L (ref 98–111)
GFR calc Af Amer: >60 mL/min (ref >60)
GLUCOSE: 89 mg/dL (ref 70–99)
POTASSIUM: 4.6 mmol/L (ref 3.5–5.1)
Sodium: 137 mmol/L (ref 135–145)
TOTAL PROTEIN: 6.9 g/dL (ref 6.5–8.1)

## 2018-03-25 MED ORDER — SODIUM CHLORIDE 0.9 % IV SOLN: 25.0000 mg | Freq: Once | INTRAVENOUS | Status: DC

## 2018-03-25 MED ORDER — DIPHENHYDRAMINE HCL 50 MG/ML IJ SOLN
25.0000 mg | Freq: Once | INTRAMUSCULAR | Status: AC
  Administered 2018-03-25: 25 mg via INTRAVENOUS
  Filled 2018-03-25: qty 1

## 2018-03-25 MED ORDER — SODIUM CHLORIDE 0.9 % IV SOLN
Freq: Once | INTRAVENOUS | Status: AC
  Administered 2018-03-25: 11:00:00 via INTRAVENOUS
  Filled 2018-03-25: qty 1000

## 2018-03-25 MED ORDER — SODIUM CHLORIDE 0.9 % IV SOLN: 10.0000 mg | Freq: Once | INTRAVENOUS | Status: DC

## 2018-03-25 MED ORDER — SODIUM CHLORIDE 0.9 % IV SOLN
375.0000 mg/m2 | Freq: Once | INTRAVENOUS | Status: AC
  Administered 2018-03-25: 700 mg via INTRAVENOUS
  Filled 2018-03-25: qty 50

## 2018-03-25 MED ORDER — DEXAMETHASONE SODIUM PHOSPHATE 10 MG/ML IJ SOLN
10.0000 mg | Freq: Once | INTRAMUSCULAR | Status: AC
Start: 2018-03-25 — End: 2018-03-25
  Administered 2018-03-25: 10 mg via INTRAVENOUS
  Filled 2018-03-25: qty 1

## 2018-03-25 MED ORDER — SODIUM CHLORIDE 0.9 % IV SOLN: 375.0000 mg/m2 | Freq: Once | INTRAVENOUS | Status: DC

## 2018-03-25 MED ORDER — ACETAMINOPHEN 325 MG PO TABS
650.0000 mg | ORAL_TABLET | Freq: Once | ORAL | Status: AC
  Administered 2018-03-25: 650 mg via ORAL
  Filled 2018-03-25: qty 2

## 2018-03-25 MED ORDER — SODIUM CHLORIDE 0.9% FLUSH
10.0000 mL | INTRAVENOUS | Status: DC | PRN
  Filled 2018-03-25: qty 10

## 2018-03-25 MED ORDER — BENDAMUSTINE HCL CHEMO INJECTION 100 MG/4ML
90.0000 mg/m2 | Freq: Once | INTRAVENOUS | Status: AC
  Administered 2018-03-25: 150 mg via INTRAVENOUS
  Filled 2018-03-25: qty 6

## 2018-03-25 MED ORDER — PALONOSETRON HCL INJECTION 0.25 MG/5ML
0.2500 mg | Freq: Once | INTRAVENOUS | Status: AC
  Administered 2018-03-25: 0.25 mg via INTRAVENOUS

## 2018-03-25 MED ORDER — HEPARIN SOD (PORK) LOCK FLUSH 100 UNIT/ML IV SOLN: 500.0000 [IU] | Freq: Once | INTRAVENOUS | Status: DC

## 2018-03-25 NOTE — Progress Notes (Signed)
Pt in for follow up and treatment today. Reports "still having tenderness from breast biopsy in April".  Pt also has a small "bite" on right outer knee with black center and reddened around the edges.  MD notified.

## 2018-03-26 ENCOUNTER — Inpatient Hospital Stay: Payer: BLUE CROSS/BLUE SHIELD

## 2018-03-26 VITALS — BP 95/60 | HR 86 | Resp 20

## 2018-03-26 DIAGNOSIS — Z5112 Encounter for antineoplastic immunotherapy: Secondary | ICD-10-CM | POA: Diagnosis not present

## 2018-03-26 DIAGNOSIS — Z5111 Encounter for antineoplastic chemotherapy: Secondary | ICD-10-CM | POA: Diagnosis not present

## 2018-03-26 DIAGNOSIS — C9110 Chronic lymphocytic leukemia of B-cell type not having achieved remission: Secondary | ICD-10-CM | POA: Diagnosis not present

## 2018-03-26 MED ORDER — DEXAMETHASONE SODIUM PHOSPHATE 10 MG/ML IJ SOLN
10.0000 mg | Freq: Once | INTRAMUSCULAR | Status: AC
  Administered 2018-03-26: 10 mg via INTRAVENOUS
  Filled 2018-03-26: qty 1

## 2018-03-26 MED ORDER — SODIUM CHLORIDE 0.9 % IV SOLN
Freq: Once | INTRAVENOUS | Status: AC
  Administered 2018-03-26: 14:00:00 via INTRAVENOUS
  Filled 2018-03-26: qty 1000

## 2018-03-26 MED ORDER — SODIUM CHLORIDE 0.9 % IV SOLN
90.0000 mg/m2 | Freq: Once | INTRAVENOUS | Status: AC
  Administered 2018-03-26: 150 mg via INTRAVENOUS
  Filled 2018-03-26: qty 6

## 2018-03-26 MED ORDER — SODIUM CHLORIDE 0.9 % IV SOLN: 10.0000 mg | Freq: Once | INTRAVENOUS | Status: DC

## 2018-04-22 ENCOUNTER — Telehealth: Payer: Self-pay | Admitting: Oncology

## 2018-04-22 ENCOUNTER — Inpatient Hospital Stay: Payer: BLUE CROSS/BLUE SHIELD | Attending: Oncology | Admitting: Oncology

## 2018-04-22 ENCOUNTER — Telehealth: Payer: Self-pay | Admitting: *Deleted

## 2018-04-22 ENCOUNTER — Inpatient Hospital Stay: Payer: BLUE CROSS/BLUE SHIELD

## 2018-04-22 ENCOUNTER — Encounter: Payer: Self-pay | Admitting: Oncology

## 2018-04-22 VITALS — BP 104/68 | HR 85 | Temp 97.0°F | Resp 18 | Wt 145.0 lb

## 2018-04-22 DIAGNOSIS — N390 Urinary tract infection, site not specified: Secondary | ICD-10-CM | POA: Insufficient documentation

## 2018-04-22 DIAGNOSIS — C9110 Chronic lymphocytic leukemia of B-cell type not having achieved remission: Secondary | ICD-10-CM | POA: Diagnosis not present

## 2018-04-22 DIAGNOSIS — Z720 Tobacco use: Secondary | ICD-10-CM | POA: Diagnosis not present

## 2018-04-22 DIAGNOSIS — N63 Unspecified lump in unspecified breast: Secondary | ICD-10-CM

## 2018-04-22 DIAGNOSIS — N6310 Unspecified lump in the right breast, unspecified quadrant: Secondary | ICD-10-CM | POA: Diagnosis not present

## 2018-04-22 LAB — COMPREHENSIVE METABOLIC PANEL
ALK PHOS: 52 U/L (ref 38–126)
ALT: 13 U/L (ref 0–44)
AST: 21 U/L (ref 15–41)
Albumin: 3.8 g/dL (ref 3.5–5.0)
Anion gap: 9 (ref 5–15)
BILIRUBIN TOTAL: 0.2 mg/dL — AB (ref 0.3–1.2)
BUN: 14 mg/dL (ref 6–20)
CALCIUM: 8.5 mg/dL — AB (ref 8.9–10.3)
CO2: 23 mmol/L (ref 22–32)
Chloride: 107 mmol/L (ref 98–111)
Creatinine, Ser: 0.7 mg/dL (ref 0.44–1.00)
GFR calc non Af Amer: >60 mL/min (ref >60)
Glucose, Bld: 124 mg/dL — ABNORMAL HIGH (ref 70–99)
POTASSIUM: 4.3 mmol/L (ref 3.5–5.1)
SODIUM: 139 mmol/L (ref 135–145)
TOTAL PROTEIN: 6.4 g/dL — AB (ref 6.5–8.1)

## 2018-04-22 LAB — CBC WITH DIFFERENTIAL/PLATELET
Basophils Absolute: 0 10*3/uL (ref 0–0.1)
Basophils Relative: 1 %
EOS PCT: 1 %
Eosinophils Absolute: 0.1 10*3/uL (ref 0–0.7)
HEMATOCRIT: 40.1 % (ref 35.0–47.0)
Hemoglobin: 13.7 g/dL (ref 12.0–16.0)
LYMPHS ABS: 2.3 10*3/uL (ref 1.0–3.6)
LYMPHS PCT: 42 %
MCH: 33.6 pg (ref 26.0–34.0)
MCHC: 34 g/dL (ref 32.0–36.0)
MCV: 98.6 fL (ref 80.0–100.0)
MONO ABS: 0.4 10*3/uL (ref 0.2–0.9)
MONOS PCT: 8 %
NEUTROS ABS: 2.6 10*3/uL (ref 1.4–6.5)
Neutrophils Relative %: 48 %
PLATELETS: 169 10*3/uL (ref 150–440)
RBC: 4.07 MIL/uL (ref 3.80–5.20)
RDW: 13.2 % (ref 11.5–14.5)
WBC: 5.5 10*3/uL (ref 3.6–11.0)

## 2018-04-22 NOTE — Telephone Encounter (Signed)
Patient called reporting that she has another lump, this one is behind her right nipple and she also reports that she has some red streaking up her arm. Appointment given for this afternoon and accepted.

## 2018-04-22 NOTE — Telephone Encounter (Signed)
-----   Message from Donnita Falls, RN sent at 04/22/2018  9:17 AM EDT ----- This patient left a voice message and wants to speak to Sonia Baller about some concerns she is having, she did not specify any more details  Thanks,  Leana Roe

## 2018-04-22 NOTE — Telephone Encounter (Signed)
[facility] schd and conf with patient for Monday, 04/27/18 @ 2:00 PM, with the arrival time of 1:30 PM. Patient given address to [facility], also tel # and instructions not to wear DEODORANT, PERFUME or LOTION. Patient fully understands.

## 2018-04-22 NOTE — Progress Notes (Signed)
Symptom Management Consult note [facility]  [phone] [phone]  Patient Care Team: [name], MD as PCP - General (Family Medicine)   Name of the patient: Monique [name]  [id number]  05-Dec-1970   Date of visit: 04/22/18  Diagnosis- CLL  Chief complaint/ Reason for visit-palpated right breast mass  Heme/Onc history: Patient last seen and evaluated by primary medical oncologist [name] on 03/25/2018 prior to cycle 3-day 1 of Rituxan and Treanda.  At that visit, she complained of breast tenderness at the site of her previous biopsy but otherwise was doing well.  They proceeded with treatment and she is scheduled to return next day for Treanda only.  Set up to return to clinic in 6 weeks with restaging CT scan and further evaluation. Oncology History   Patient with recent CLL diagnosis by flow cytometry.  Previously not being treated in observation.  S/p 4 cycles of weekly Rituxan completing on 02/20/2017 scans from January 2018 revealed minimal progression.  Developed right breast mass in March 2019.  Had diagnostic mammogram performed on 12/26/2017 showed suspicious right breast mass.  Biopsy revealed granulomatous mastitis.  She was treated with doxycycline 100 mg twice daily for 7 days.  Developed worsening adenopathy.  CT chest/abdomen/pelvis and soft tissue neck from May 2019 revealed progressive bulky adenopathy in the chest, abdomen and pelvis compatible with worsening disease.  [name] recommended beginning treatment with Rituxan and Treanda.  Received first cycle last week.  S/p 6 cycles of Rituxan and Treanda.  Tolerating well.  Last dose given 03/25/18.     CLL (chronic lymphocytic leukemia) ([place])   11/24/2016 Initial Diagnosis    CLL (chronic lymphocytic leukemia) ([place])    01/20/2018 -  Chemotherapy    The patient had palonosetron (ALOXI) injection 0.25 mg, 0.25 mg, Intravenous,  Once, 4 of 5  cycles Administration: 0.25 mg (01/28/2018), 0.25 mg (02/25/2018), 0.25 mg (03/25/2018) riTUXimab (RITUXAN) 700 mg in sodium chloride 0.9 % 250 mL (2.1875 mg/mL) infusion, 375 mg/m2 = 700 mg, Intravenous,  Once, 4 of 5 cycles Administration: 700 mg (01/28/2018) bendamustine (BENDEKA) 150 mg in sodium chloride 0.9 % 50 mL (2.6786 mg/mL) chemo infusion, 90 mg/m2 = 150 mg, Intravenous,  Once, 4 of 5 cycles Administration: 150 mg (01/28/2018), 150 mg (01/29/2018), 150 mg (02/25/2018), 150 mg (02/26/2018), 150 mg (03/25/2018), 150 mg (03/26/2018)  for chemotherapy treatment.       Interval history- Breast Mass: Patient presents for evaluation of a breast mass. Change was noted 1 week ago. Patient does routinely do self breast exams.  Patient has noted a change on breast exam. Breast cancer risk factors include None.   Age of menarche was 6.  Premenopausal  Last menstrual period was 2 weeks ago Patient denies hormonal therapy. Patient is G0P0.  Patient denies nipple discharge. Patient admits to to previous breast biopsy. Patient denies a personal history of breast cancer.  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  She was given a prescription for doxycycline with complete resolution.  ECOG FS:1 - Symptomatic but completely ambulatory  Review of systems- Review of Systems  Constitutional: Negative.  Negative for chills, fever, malaise/fatigue and weight loss.  HENT: Negative for congestion and ear pain.   Eyes: Negative.  Negative for blurred vision and double vision.  Respiratory: Negative.  Negative for cough, sputum production and shortness of breath.   Cardiovascular: Negative.  Negative for chest pain, palpitations and leg swelling.  Gastrointestinal: Negative.  Negative for abdominal pain, constipation, diarrhea, nausea and vomiting.  Genitourinary: Negative for dysuria, frequency and urgency.  Musculoskeletal: Negative for back pain and falls.       New palpated right  breast mass  Skin: Negative.  Negative for rash.  Neurological: Negative.  Negative for weakness and headaches.  Endo/Heme/Allergies: Negative.  Does not bruise/bleed easily.  Psychiatric/Behavioral: Negative.  Negative for depression. The patient is not nervous/anxious and does not have insomnia.      Current treatment- s/p 3 cycles of Rituxan and Treanda.  Last given on 03/25/2018.  No Known Allergies   Past Medical History:  Diagnosis Date  . Anxiety   . Cervical cancer ([place])    hx LEEP over 18 years ago.   . Cervical intraepithelial neoplasia I   . Chronic lymphocytic leukemia ([place])   . Depression   . Eating disorder   . History of self-harm   . Insomnia   . Obsession   . Pap smear abnormality of cervix with LGSIL   . Tobacco abuse   . Vitamin B12 deficiency (non anemic)      Past Surgical History:  Procedure Laterality Date  . BREAST BIOPSY Right 01/05/2018   US guided biopsy of 2 areas and 1 lymph node  . CERVICAL BIOPSY  W/ LOOP ELECTRODE EXCISION    . TUBAL LIGATION      Social History   Socioeconomic History  . Marital status: Divorced    Spouse name: Not on file  . Number of children: Not on file  . Years of education: Not on file  . Highest education level: Not on file  Occupational History  . Not on file  Social Needs  . Financial resource strain: Not on file  . Food insecurity:    Worry: Not on file    Inability: Not on file  . Transportation needs:    Medical: Not on file    Non-medical: Not on file  Tobacco Use  . Smoking status: Current Every Day Smoker    Packs/day: 1.00    Years: 31.00    Pack years: 31.00    Start date: 09/25/1986  . Smokeless tobacco: Never Used  . Tobacco comment: Would like to discuss Chantix- she forgot to take this medication  Substance and Sexual Activity  . Alcohol use: Yes    Alcohol/week: 0.0 standard drinks    Comment: rarely  . Drug use: No  . Sexual activity: Yes    Partners: Male    Birth  control/protection: None  Lifestyle  . Physical activity:    Days per week: Not on file    Minutes per session: Not on file  . Stress: Not on file  Relationships  . Social connections:    Talks on phone: Not on file    Gets together: Not on file    Attends religious service: Not on file    Active member of club or organization: Not on file    Attends meetings of clubs or organizations: Not on file    Relationship status: Not on file  . Intimate partner violence:    Fear of current or ex partner: Not on file    Emotionally abused: Not on file    Physically abused: Not on file    Forced sexual activity: Not on file  Other Topics Concern  . Not on file  Social History Narrative  . Not on file    Family History  Problem Relation Age of Onset  .  Cancer Mother        thyroid  . Alcohol abuse Father   . Depression Sister   . Cancer Sister        cevical  . Alcohol abuse Brother   . Depression Brother   . Bipolar disorder Brother   . ADD / ADHD Son   . Breast cancer Neg Hx      Current Outpatient Medications:  .  Albuterol Sulfate (PROAIR RESPICLICK) 937 (90 Base) MCG/ACT AEPB, Inhale 1 puff into the lungs 3 (three) times daily as needed., Disp: 1 each, Rfl: 0 .  cephALEXin (KEFLEX) 500 MG capsule, Take 500 mg by mouth 2 (two) times daily., Disp: , Rfl:  .  escitalopram (LEXAPRO) 10 MG tablet, , Disp: , Rfl:  .  gabapentin (NEURONTIN) 100 MG capsule, Take 100 mg twice a day for one week, then increase to 200 mg(2 tablets) twice a day and continue, Disp: , Rfl:  .  traMADol (ULTRAM) 50 MG tablet, Take by mouth every 6 (six) hours as needed., Disp: , Rfl:  .  umeclidinium-vilanterol (ANORO ELLIPTA) 62.5-25 MCG/INH AEPB, TAKE 1 PUFF BY MOUTH EVERY DAY, Disp: 60 each, Rfl: 5 .  ondansetron (ZOFRAN) 8 MG tablet, Take 1 tablet (8 mg total) by mouth 2 (two) times daily as needed for refractory nausea / vomiting. (Patient not taking: Reported on 04/22/2018), Disp: 30 tablet, Rfl: 2 .   prochlorperazine (COMPAZINE) 10 MG tablet, Take 1 tablet (10 mg total) by mouth every 6 (six) hours as needed (Nausea or vomiting). (Patient not taking: Reported on 04/22/2018), Disp: 60 tablet, Rfl: 2  Physical exam:  Vitals:   04/22/18 1411 04/22/18 1414  BP:  104/68  Pulse:  85  Resp:  18  Temp:  (!) 97 F (36.1 C)  TempSrc: Tympanic Tympanic  Weight: 145 lb (65.8 kg)    Physical Exam  Constitutional: She is oriented to person, place, and time. Vital signs are normal. She appears well-developed and well-nourished.  HENT:  Head: Normocephalic and atraumatic.  Eyes: Pupils are equal, round, and reactive to light.  Neck: Normal range of motion.  Cardiovascular: Normal rate, regular rhythm and normal heart sounds.  No murmur heard. Pulmonary/Chest: Effort normal and breath sounds normal. She has no wheezes. Right breast exhibits mass.    Abdominal: Soft. Normal appearance and bowel sounds are normal. She exhibits no distension. There is no tenderness.  Musculoskeletal: Normal range of motion. She exhibits no edema.  Neurological: She is alert and oriented to person, place, and time.  Skin: Skin is warm and dry. No rash noted.  Psychiatric: Judgment normal.     CMP Latest Ref Rng & Units 04/22/2018  Glucose 70 - 99 mg/dL 124(H)  BUN 6 - 20 mg/dL 14  Creatinine 0.44 - 1.00 mg/dL 0.70  Sodium 135 - 145 mmol/L 139  Potassium 3.5 - 5.1 mmol/L 4.3  Chloride 98 - 111 mmol/L 107  CO2 22 - 32 mmol/L 23  Calcium 8.9 - 10.3 mg/dL 8.5(L)  Total Protein 6.5 - 8.1 g/dL 6.4(L)  Total Bilirubin 0.3 - 1.2 mg/dL 0.2(L)  Alkaline Phos 38 - 126 U/L 52  AST 15 - 41 U/L 21  ALT 0 - 44 U/L 13   CBC Latest Ref Rng & Units 04/22/2018  WBC 3.6 - 11.0 K/uL 5.5  Hemoglobin 12.0 - 16.0 g/dL 13.7  Hematocrit 35.0 - 47.0 % 40.1  Platelets 150 - 440 K/uL 169    No images are attached to the  encounter.  No results found.   Assessment and plan- Patient is a 47 y.o. female who presents for a palpated  tender breast mass of right breast.   1.  CLL: Status post 3 cycles of Rituxan and Treanda.  Tolerating well.  Scheduled for imaging on 05/06/2018 and RTC on 05/07/18 for results, assessment and cycle 4 of Rituxan and Treanda.   2.  Right breast mass: Palpated while in the shower approximately 2 weeks ago.  States it has become  larger and more tender.  On examination, a tender 4 X 2 centimeter movable breast mass identified.  Will get stat diagnostic mammogram with possible ultrasound ASAP.  Scheduled for Monday, 04/27/2018 at 2:20pm.  She has had previous work-up of right breast where several masses were identified in April 2018.  Ultrasound-guided biopsy revealed granulomatosis mastitis.  She was prescribed doxycycline.  Has resolved.   Visit Diagnosis 1. Breast mass, right     Patient expressed understanding and was in agreement with this plan. She also understands that She can call clinic at any time with any questions, concerns, or complaints.   Greater than 50% was spent in counseling and coordination of care with this patient including but not limited to discussion of the relevant topics above (See A&P) including, but not limited to diagnosis and management of acute and chronic medical conditions.    Faythe Casa, AGNP-C [facility] [place]- [phone] Pager- [phone] 04/24/2018 12:27 PM

## 2018-04-27 ENCOUNTER — Ambulatory Visit
Admission: RE | Admit: 2018-04-27 | Discharge: 2018-04-27 | Disposition: A | Discharge status: Home / Self Care | Payer: BLUE CROSS/BLUE SHIELD | Source: Ambulatory Visit | Attending: Oncology | Admitting: Oncology

## 2018-04-27 DIAGNOSIS — N6310 Unspecified lump in the right breast, unspecified quadrant: Secondary | ICD-10-CM

## 2018-04-27 DIAGNOSIS — R928 Other abnormal and inconclusive findings on diagnostic imaging of breast: Secondary | ICD-10-CM | POA: Diagnosis not present

## 2018-04-29 ENCOUNTER — Telehealth: Payer: Self-pay | Admitting: *Deleted

## 2018-04-29 ENCOUNTER — Other Ambulatory Visit: Payer: Self-pay | Admitting: *Deleted

## 2018-04-29 DIAGNOSIS — N63 Unspecified lump in unspecified breast: Secondary | ICD-10-CM

## 2018-04-29 NOTE — Telephone Encounter (Signed)
Left vm for pt to return call  

## 2018-05-05 ENCOUNTER — Ambulatory Visit (INDEPENDENT_AMBULATORY_CARE_PROVIDER_SITE_OTHER): Payer: BLUE CROSS/BLUE SHIELD | Admitting: General Surgery

## 2018-05-05 ENCOUNTER — Encounter: Payer: Self-pay | Admitting: General Surgery

## 2018-05-05 VITALS — BP 102/64 | HR 85 | Resp 14 | Ht 67.0 in | Wt 149.0 lb

## 2018-05-05 DIAGNOSIS — N644 Mastodynia: Secondary | ICD-10-CM | POA: Insufficient documentation

## 2018-05-05 DIAGNOSIS — N6310 Unspecified lump in the right breast, unspecified quadrant: Secondary | ICD-10-CM | POA: Diagnosis not present

## 2018-05-05 NOTE — Patient Instructions (Addendum)
  Recommend 2 Aleve twice a day for 5 days then once a day for 2 weeks, call back with a status report.  The patient is aware to call back for any questions or new concerns.

## 2018-05-05 NOTE — Progress Notes (Signed)
Patient ID: Monique [name], female   DOB: January 10, 1971, 47 y.o.   MRN: [id number]  Chief Complaint  Patient presents with  . Breast Problem    HPI Monique [name] is a 47 y.o. female.  who presents for a breast evaluation referred by [name]. The most recent right mammogram was done on 04-27-18.  Patient does perform regular self breast checks and gets regular mammograms done.   She states she found 3 lumps in the right breast around February. She did have biopsies completed in April, no cancer. She states her right breast has pain and is tender to touch that comes and goes since July. The right breast biopsy did show mastitis, she was treated and now the knot is larger. She has CLL and is followed by Dr Grayland Ormond. She is a Freight forwarder at [employer].  HPI  Past Medical History:  Diagnosis Date  . Anxiety   . Bursitis    leg pain  . Cervical cancer ([place])    hx LEEP over 18 years ago.   . Cervical intraepithelial neoplasia I   . Chronic lymphocytic leukemia ([place]) 2018   Dr Grayland Ormond  . Depression   . Eating disorder   . History of self-harm   . Insomnia   . Obsession   . Pap smear abnormality of cervix with LGSIL   . Tobacco abuse   . Vitamin B12 deficiency (non anemic)     Past Surgical History:  Procedure Laterality Date  . BREAST BIOPSY Right 01/05/2018   US guided biopsy of 2 areas and 1 lymph node, MIXED INFLAMMATION AND GIANT CELL REACTION  . CERVICAL BIOPSY  W/ LOOP ELECTRODE EXCISION    . TUBAL LIGATION      Family History  Problem Relation Age of Onset  . Cancer Mother        thyroid  . Alcohol abuse Father   . Depression Sister   . Cancer Sister        cevical  . Alcohol abuse Brother   . Depression Brother   . Bipolar disorder Brother   . ADD / ADHD Son   . Breast cancer Neg Hx     Social History Social History   Tobacco Use  . Smoking status: Current Every Day Smoker    Packs/day: 1.00    Years: 31.00    Pack years: 31.00    Start date:  09/25/1986  . Smokeless tobacco: Never Used  . Tobacco comment: Would like to discuss Chantix- she forgot to take this medication  Substance Use Topics  . Alcohol use: Yes    Alcohol/week: 0.0 standard drinks    Comment: rarely  . Drug use: Yes    Types: Marijuana    No Known Allergies  Current Outpatient Medications  Medication Sig Dispense Refill  . Albuterol Sulfate (PROAIR RESPICLICK) 373 (90 Base) MCG/ACT AEPB Inhale 1 puff into the lungs 3 (three) times daily as needed. 1 each 0  . escitalopram (LEXAPRO) 10 MG tablet as needed.     . gabapentin (NEURONTIN) 100 MG capsule Take 100 mg twice a day for one week, then increase to 200 mg(2 tablets) twice a day and continue    . HYDROcodone-acetaminophen (NORCO/VICODIN) 5-325 MG tablet every 6 (six) hours as needed.     . traMADol (ULTRAM) 50 MG tablet Take by mouth every 6 (six) hours as needed.    . umeclidinium-vilanterol (ANORO ELLIPTA) 62.5-25 MCG/INH AEPB TAKE 1 PUFF BY MOUTH EVERY DAY 60 each 5  .  ondansetron (ZOFRAN) 8 MG tablet Take 1 tablet (8 mg total) by mouth 2 (two) times daily as needed for refractory nausea / vomiting. (Patient not taking: Reported on 04/22/2018) 30 tablet 2  . prochlorperazine (COMPAZINE) 10 MG tablet Take 1 tablet (10 mg total) by mouth every 6 (six) hours as needed (Nausea or vomiting). (Patient not taking: Reported on 04/22/2018) 60 tablet 2   No current facility-administered medications for this visit.     Review of Systems Review of Systems  Constitutional: Negative.   Respiratory: Positive for cough.   Cardiovascular: Negative.     Blood pressure 102/64, pulse 85, resp. rate 14, height 5\' 7"  (1.702 m), weight 149 lb (67.6 kg), last menstrual period 04/22/2018, SpO2 96 %.  Physical Exam Physical Exam  Constitutional: She is oriented to person, place, and time. She appears well-developed and well-nourished.  HENT:  Mouth/Throat: Oropharynx is clear and moist.  Eyes: Conjunctivae are normal. No  scleral icterus.  Neck: Neck supple.  Cardiovascular: Normal rate, regular rhythm and normal heart sounds.  Pulmonary/Chest: Effort normal and breath sounds normal. Right breast exhibits inverted nipple and mass. Right breast exhibits no nipple discharge, no skin change and no tenderness. Left breast exhibits no inverted nipple, no mass, no nipple discharge, no skin change and no tenderness.  Slight retraction right nipple since breast biopsy. 1 cm mass at 8 o'clock right breast.     Lymphadenopathy:    She has no cervical adenopathy.    She has axillary adenopathy (bilateral adenopathy, < 2 cm, non-tender. ).  Left axillary nodes.  Neurological: She is alert and oriented to person, place, and time.  Skin: Skin is warm and dry.  Psychiatric: Her behavior is normal.    Data Reviewed December 26, 2017 mammograms and ultrasound through April 27, 2018 right breast diagnostic mammogram and ultrasound reviewed.  April 27, 2018 mammogram and ultrasound: Targeted ultrasound is performed, showing right breast 8 o'clock 1 cm hypoechoic mass with irregular borders containing post biopsy tissue marker. The mass measures 1.3 x 0.8 x 1.8 cm. Previously it measured 1.2 x 0.8 x 1.3 cm. Ultrasound examination of the right breast 10 o'clock 5 cm from the nipple demonstrates no appreciable Abnormalities.  Biopsy results from January 08, 2018 reviewed. DIAGNOSIS:  A. RIGHT BREAST, 8:00, RETROAREOLAR; ULTRASOUND-GUIDED BIOPSY:  - NEGATIVE FOR ATYPIA AND MALIGNANCY.  - MIXED INFLAMMATION AND GIANT CELL REACTION, SEE NOTE.   Note: The differential diagnosis includes infection and granulomatous  mastitis. AFB and Gram stains are negative with working controls.   B. RIGHT BREAST, 10:00, 3 CMFN; ULTRASOUND-GUIDED BIOPSY:  - FIBROCYSTIC CHANGES.  - NEGATIVE FOR ATYPIA AND MALIGNANCY.   C. RIGHT AXILLARY LYMPH NODE; ULTRASOUND-GUIDED BIOPSY:  - CONSISTENT WITH INVOLVEMENT BY PATIENT'S KNOWN CLL/SLL.    Assessment    Asymptomatic inflammatory process involving the right retroareolar tissue.  CLL.    Plan    The patient reports she is asymptomatic in regards to her CLL until she becomes aware of a palpable node such as noted in the axilla or more recently in the left posterior cervical chain.  The breast is been an asymptomatic issue.  The core biopsy obtained in April of this year is consistent with a chronic inflammatory process, potentially aggravated by her immune system response with no evidence of malignancy.  5 mm change over the past 4 months is of questionable significance.  Discussed options of excision, anti inflammatory or observation.  The patient reports that time she will have  pain in the retroareolar area that is enough to cause her to stop, take deep breath and hold her breast for comfort until the sensation passes.  Simple observation seems unwise.  As this is most likely an inflammatory process rather than infectious I recommended she make use of a trial of an anti-inflammatory regimen: 2 Aleve twice a day for 5 days then once a day for 2 weeks, call back with a status report.  If the patient has not experienced significant improvement in her symptoms, surgical excision can be undertaken recognizing this may interfere with nipple sensation but may restore nipple contour.    The patient is aware to call back for any questions or new concerns.     HPI, Physical Exam, Assessment and Plan have been scribed under the direction and in the presence of Robert Bellow, MD. Karie Fetch, RN  I have completed the exam and reviewed the above documentation for accuracy and completeness.  I agree with the above.  Haematologist has been used and any errors in dictation or transcription are unintentional.  Hervey Ard, M.D., F.A.C.S.  Forest Gleason Marialena Wollen 05/05/2018, 8:45 PM

## 2018-05-05 NOTE — Progress Notes (Signed)
[name]  [phone] [phone]) [phone]  ID: Monique [name] OB: 02/20/1971  MR#: [id number]  JIR#:[id number]  Patient Care Team: [name], MD as PCP - General (Family Medicine)  CHIEF COMPLAINT: CLL.  INTERVAL HISTORY: Patient returns to clinic today for further evaluation and discussion of her imaging results.  She was recently evaluated by surgery for an enlarging breast mass which was thought to be inflammatory in nature.  She also is being evaluated for UTI.  She otherwise feels well.  She continues to be active and work full-time.  She has no neurologic complaints. She denies any fevers, illnesses, or weight loss. She has no chest pain or shortness of breath. She denies any nausea, vomiting, constipation, or diarrhea.  Patient offers no further specific complaints today.  REVIEW OF SYSTEMS:   Review of Systems  Constitutional: Negative.  Negative for fever, malaise/fatigue and weight loss.  HENT: Negative.  Negative for congestion.   Respiratory: Negative.  Negative for cough and shortness of breath.   Cardiovascular: Negative.  Negative for chest pain and leg swelling.  Gastrointestinal: Negative.  Negative for abdominal pain and constipation.  Genitourinary: Positive for frequency. Negative for dysuria.  Musculoskeletal: Negative.  Negative for myalgias and neck pain.  Skin: Negative.  Negative for rash.  Neurological: Negative.  Negative for tingling, sensory change, focal weakness and weakness.  Psychiatric/Behavioral: The patient is nervous/anxious.     As per HPI. Otherwise, a complete review of systems is negative.  PAST MEDICAL HISTORY: Past Medical History:  Diagnosis Date  . Anxiety   . Bursitis    leg pain  . Cervical cancer ([place])    hx LEEP over 18 years ago.   . Cervical intraepithelial neoplasia I   . Chronic lymphocytic leukemia ([place]) 2018   Dr Grayland Ormond  . Depression   . Eating disorder   . History of self-harm    . Insomnia   . Obsession   . Pap smear abnormality of cervix with LGSIL   . Tobacco abuse   . Vitamin B12 deficiency (non anemic)     PAST SURGICAL HISTORY: Past Surgical History:  Procedure Laterality Date  . BREAST BIOPSY Right 01/05/2018   US guided biopsy of 2 areas and 1 lymph node, MIXED INFLAMMATION AND GIANT CELL REACTION  . CERVICAL BIOPSY  W/ LOOP ELECTRODE EXCISION    . TUBAL LIGATION      FAMILY HISTORY: Family History  Problem Relation Age of Onset  . Cancer Mother        thyroid  . Alcohol abuse Father   . Depression Sister   . Cancer Sister        cevical  . Alcohol abuse Brother   . Depression Brother   . Bipolar disorder Brother   . ADD / ADHD Son   . Breast cancer Neg Hx     ADVANCED DIRECTIVES (Y/N):  N  HEALTH MAINTENANCE: Social History   Tobacco Use  . Smoking status: Current Every Day Smoker    Packs/day: 1.00    Years: 31.00    Pack years: 31.00    Start date: 09/25/1986  . Smokeless tobacco: Never Used  . Tobacco comment: Would like to discuss Chantix- she forgot to take this medication  Substance Use Topics  . Alcohol use: Yes    Alcohol/week: 0.0 standard drinks    Comment: rarely  . Drug use: Yes    Types: Marijuana     Colonoscopy:  PAP:  Bone density:  Lipid panel:  No Known Allergies  Current Outpatient Medications  Medication Sig Dispense Refill  . gabapentin (NEURONTIN) 100 MG capsule Take 100 mg twice a day for one week, then increase to 200 mg(2 tablets) twice a day and continue    . sulfamethoxazole-trimethoprim (BACTRIM DS,SEPTRA DS) 800-160 MG tablet Take 1 tablet by mouth 2 (two) times daily for 3 days. 6 tablet 0  . traMADol (ULTRAM) 50 MG tablet Take by mouth every 6 (six) hours as needed.    . umeclidinium-vilanterol (ANORO ELLIPTA) 62.5-25 MCG/INH AEPB TAKE 1 PUFF BY MOUTH EVERY DAY 60 each 5  . varenicline (CHANTIX STARTING MONTH PAK) 0.5 MG X 11 & 1 MG X 42 tablet Take one 0.5 mg tablet by mouth once  daily for 3 days, then increase to one 0.5 mg tablet twice daily for 4 days, then increase to one 1 mg tablet twice daily. 53 tablet 0  . Albuterol Sulfate (PROAIR RESPICLICK) 017 (90 Base) MCG/ACT AEPB Inhale 1 puff into the lungs 3 (three) times daily as needed. (Patient not taking: Reported on 05/07/2018) 1 each 0  . escitalopram (LEXAPRO) 10 MG tablet as needed.     Marland Kitchen HYDROcodone-acetaminophen (NORCO/VICODIN) 5-325 MG tablet every 6 (six) hours as needed.     . ondansetron (ZOFRAN) 8 MG tablet Take 1 tablet (8 mg total) by mouth 2 (two) times daily as needed for refractory nausea / vomiting. (Patient not taking: Reported on 04/22/2018) 30 tablet 2  . prochlorperazine (COMPAZINE) 10 MG tablet Take 1 tablet (10 mg total) by mouth every 6 (six) hours as needed (Nausea or vomiting). (Patient not taking: Reported on 04/22/2018) 60 tablet 2   No current facility-administered medications for this visit.     OBJECTIVE: Vitals:   05/07/18 1030  BP: 98/63  Pulse: 80  Resp: 18  Temp: (!) 96.8 F (36 C)     Body mass index is 23.34 kg/m.    ECOG FS:0 - Asymptomatic  General: Well-developed, well-nourished, no acute distress. Eyes: Pink conjunctiva, anicteric sclera. HEENT: Normocephalic, moist mucous membranes, clear oropharnyx.  Lymphadenopathy resolved. Breast: Tender, patient requested exam be deferred.  Recently evaluated by surgery. Lungs: Clear to auscultation bilaterally. Heart: Regular rate and rhythm. No rubs, murmurs, or gallops. Abdomen: Soft, nontender, nondistended. No organomegaly noted, normoactive bowel sounds. Musculoskeletal: No edema, cyanosis, or clubbing. Neuro: Alert, answering all questions appropriately. Cranial nerves grossly intact. Skin: No rashes or petechiae noted. Psych: Normal affect.  LAB RESULTS:  Lab Results  Component Value Date   NA 138 05/07/2018   K 4.5 05/07/2018   CL 107 05/07/2018   CO2 24 05/07/2018   GLUCOSE 132 (H) 05/07/2018   BUN 16  05/07/2018   CREATININE 0.81 05/07/2018   CALCIUM 8.4 (L) 05/07/2018   PROT 5.9 (L) 05/07/2018   ALBUMIN 3.7 05/07/2018   AST 20 05/07/2018   ALT 14 05/07/2018   ALKPHOS 53 05/07/2018   BILITOT 0.4 05/07/2018   GFRNONAA >60 05/07/2018   GFRAA >60 05/07/2018    Lab Results  Component Value Date   WBC 5.0 05/07/2018   NEUTROABS 2.1 05/07/2018   HGB 13.2 05/07/2018   HCT 38.9 05/07/2018   MCV 98.8 05/07/2018   PLT 173 05/07/2018     STUDIES: Ct Soft Tissue Neck W Contrast  Result Date: 05/06/2018 CLINICAL DATA:  Re-stage CLL EXAM: CT NECK WITH CONTRAST TECHNIQUE: Multidetector CT imaging of the neck was performed using the standard protocol following the bolus administration of intravenous contrast.  CONTRAST:  145mL ISOVUE-300 IOPAMIDOL (ISOVUE-300) INJECTION 61% COMPARISON:  01/15/2018 FINDINGS: Pharynx and larynx: No evidence of mass or swelling. Salivary glands: No inflammation, mass, or stone. Thyroid: Normal. Lymph nodes: Diffuse homogeneous enlargement of lymph nodes throughout the bilateral neck. Index nodes: 1. Right jugulodigastric node on 3:48 measures 9 mm in short axis, essentially stable 2. Upper right jugular node on 3:43 measures 7 mm short axis, 1 mm smaller than before. 3. Left jugulodigastric node on 3:42 measures 7 mm short axis, decreased from 10 mm. 4. Left posterior triangle on 3:61 measures 11 mm short axis, stable from prior when remeasured in a similar fashion. Chest nodes reported separately Vascular: Negative Limited intracranial: Negative Visualized orbits: Negative Mastoids and visualized paranasal sinuses: Clear Skeleton: No acute or aggressive finding Upper chest: Centrilobular emphysema.  Chest CT reported separately. IMPRESSION: 1. CLL with no interval progression in the neck. Rather, there is a trend toward slight decrease in nodal size (on the order of 1-3 mm). 2. Body CT reported separately. Electronically Signed   By: Monte Fantasia M.D.   On: 05/06/2018  10:52   Ct Chest W Contrast  Result Date: 05/06/2018 CLINICAL DATA:  Restaging chronic lymphocytic leukemia EXAM: CT CHEST, ABDOMEN, AND PELVIS WITH CONTRAST TECHNIQUE: Multidetector CT imaging of the chest, abdomen and pelvis was performed following the standard protocol during bolus administration of intravenous contrast. CONTRAST:  154mL ISOVUE-300 IOPAMIDOL (ISOVUE-300) INJECTION 61% COMPARISON:  01/15/2018 FINDINGS: CT CHEST FINDINGS Cardiovascular: The heart size is normal. No pericardial effusion. Mild aortic atherosclerosis. Mediastinum/Nodes: Normal appearance of the thyroid gland. The trachea appears patent and is midline. Normal appearance of the esophagus. Thoracic adenopathy is again noted. The index left retropectoral node measures 1.4 cm, image 10/4. Previously 2.1 cm. Index right axillary lymph node measures 1.5 cm, image 11/4. Previously this measured the same. Enlarged right paratracheal lymph node measures 1.4 cm, image 17/4. Unchanged from previous exam. Right paratracheal lymph node measures 1.6 cm, image 23/4. Previously 1.4 cm. Index subcarinal lymph node measures 1.3 cm, image 28/4. Previously 1.7 cm. Lungs/Pleura: No pleural effusion. Mild changes of emphysema. No airspace consolidation, atelectasis or pneumothorax identified. The subpleural nodule in the right upper lobe is unchanged measuring 4 mm, image 39/6. 6 mm right middle lobe lung nodule is stable, image 80/6. Musculoskeletal: No aggressive lytic or sclerotic bone lesions. CT ABDOMEN PELVIS FINDINGS Hepatobiliary: No focal liver abnormality is seen. No gallstones, gallbladder wall thickening, or biliary dilatation. Pancreas: Unremarkable. No pancreatic ductal dilatation or surrounding inflammatory changes. Spleen: Spleen measures 9.8 cm cranial caudal.  Unchanged. Adrenals/Urinary Tract: Normal appearance of the adrenal glands. Kidneys are unremarkable. The urinary bladder appears normal. Stomach/Bowel: Stomach is within normal  limits. Appendix appears normal. No evidence of bowel wall thickening, distention, or inflammatory changes. Vascular/Lymphatic: Mild aortic atherosclerosis. Extensive abdominal adenopathy is again identified. The large left retroperitoneal nodal mass measures 3.9 cm, image 87/4. Previously 4.8 cm. Index ileo jejunal mesenteric lymph node measures 2.1 cm, image 87/4. Previously 2.4 cm. Enlarged bilateral pelvic sidewall adenopathy is again noted. Index right pelvic sidewall node measures 2.4 cm short axis, image 112/4. Previously 2.5 cm. On the left lymph node measures 2.8 cm, image 115/4. Previously 3.2 cm. Reproductive: Uterus and bilateral adnexa are unremarkable. Other: No free fluid or fluid collections. Musculoskeletal: No acute or significant osseous findings. IMPRESSION: 1. Persistent bulky adenopathy is identified within the chest, abdomen and pelvis. When compared with 01/15/2018 there has been no significant change in overall tumor burden within  the chest. Adenopathy within the abdomen and pelvis is mildly improved in the interval. 2.  Aortic Atherosclerosis (ICD10-I70.0). Electronically Signed   By: Kerby Moors M.D.   On: 05/06/2018 14:06   Ct Abdomen Pelvis W Contrast  Result Date: 05/06/2018 CLINICAL DATA:  Restaging chronic lymphocytic leukemia EXAM: CT CHEST, ABDOMEN, AND PELVIS WITH CONTRAST TECHNIQUE: Multidetector CT imaging of the chest, abdomen and pelvis was performed following the standard protocol during bolus administration of intravenous contrast. CONTRAST:  111mL ISOVUE-300 IOPAMIDOL (ISOVUE-300) INJECTION 61% COMPARISON:  01/15/2018 FINDINGS: CT CHEST FINDINGS Cardiovascular: The heart size is normal. No pericardial effusion. Mild aortic atherosclerosis. Mediastinum/Nodes: Normal appearance of the thyroid gland. The trachea appears patent and is midline. Normal appearance of the esophagus. Thoracic adenopathy is again noted. The index left retropectoral node measures 1.4 cm, image  10/4. Previously 2.1 cm. Index right axillary lymph node measures 1.5 cm, image 11/4. Previously this measured the same. Enlarged right paratracheal lymph node measures 1.4 cm, image 17/4. Unchanged from previous exam. Right paratracheal lymph node measures 1.6 cm, image 23/4. Previously 1.4 cm. Index subcarinal lymph node measures 1.3 cm, image 28/4. Previously 1.7 cm. Lungs/Pleura: No pleural effusion. Mild changes of emphysema. No airspace consolidation, atelectasis or pneumothorax identified. The subpleural nodule in the right upper lobe is unchanged measuring 4 mm, image 39/6. 6 mm right middle lobe lung nodule is stable, image 80/6. Musculoskeletal: No aggressive lytic or sclerotic bone lesions. CT ABDOMEN PELVIS FINDINGS Hepatobiliary: No focal liver abnormality is seen. No gallstones, gallbladder wall thickening, or biliary dilatation. Pancreas: Unremarkable. No pancreatic ductal dilatation or surrounding inflammatory changes. Spleen: Spleen measures 9.8 cm cranial caudal.  Unchanged. Adrenals/Urinary Tract: Normal appearance of the adrenal glands. Kidneys are unremarkable. The urinary bladder appears normal. Stomach/Bowel: Stomach is within normal limits. Appendix appears normal. No evidence of bowel wall thickening, distention, or inflammatory changes. Vascular/Lymphatic: Mild aortic atherosclerosis. Extensive abdominal adenopathy is again identified. The large left retroperitoneal nodal mass measures 3.9 cm, image 87/4. Previously 4.8 cm. Index ileo jejunal mesenteric lymph node measures 2.1 cm, image 87/4. Previously 2.4 cm. Enlarged bilateral pelvic sidewall adenopathy is again noted. Index right pelvic sidewall node measures 2.4 cm short axis, image 112/4. Previously 2.5 cm. On the left lymph node measures 2.8 cm, image 115/4. Previously 3.2 cm. Reproductive: Uterus and bilateral adnexa are unremarkable. Other: No free fluid or fluid collections. Musculoskeletal: No acute or significant osseous  findings. IMPRESSION: 1. Persistent bulky adenopathy is identified within the chest, abdomen and pelvis. When compared with 01/15/2018 there has been no significant change in overall tumor burden within the chest. Adenopathy within the abdomen and pelvis is mildly improved in the interval. 2.  Aortic Atherosclerosis (ICD10-I70.0). Electronically Signed   By: Kerby Moors M.D.   On: 05/06/2018 14:06   US Breast Limited Uni Right Inc Axilla  Result Date: 04/27/2018 CLINICAL DATA:  Enlarging and tender right subareolar breast mass. Patient had a benign core needle biopsy of her right breast 8 o'clock subareolar mass demonstrating mixed inflammation with giant cell reaction, and ultrasound-guided core needle biopsy of her right breast 10 o'clock mass 3 cm from the nipple demonstrating benign fibrocystic changes. Patient also has a diagnosis of CLL, with involvement of right axillary lymph nodes. EXAM: DIGITAL DIAGNOSTIC RIGHT MAMMOGRAM WITH CAD AND TOMO ULTRASOUND RIGHT BREAST COMPARISON:  Previous exam(s). ACR Breast Density Category b: There are scattered areas of fibroglandular density. FINDINGS: Mammographic views of the right breast demonstrate persistent ill-defined mass in the slightly  lower outer right breast, anterior depth, containing coil shaped tissue marker. This mass corresponds to the mass labeled 8 o'clock on ultrasound, demonstrating mixed inflammation with giant cell reaction. This mass measures approximately 1.7 cm mammographically. No other suspicious masses are seen. A second post biopsy marker is seen in the right breast upper outer quadrant from benign core needle biopsy of the right breast 10 o'clock 3 cm from the nipple mass. Mammographic images were processed with CAD. On physical exam, there is a firm fixed palpable mass in the subareolar right 8 o'clock breast, which corresponds to the area of tenderness and palpable concern described by the patient. Targeted ultrasound is performed,  showing right breast 8 o'clock 1 cm hypoechoic mass with irregular borders containing post biopsy tissue marker. The mass measures 1.3 x 0.8 x 1.8 cm. Previously it measured 1.2 x 0.8 x 1.3 cm. Ultrasound examination of the right breast 10 o'clock 5 cm from the nipple demonstrates no appreciable abnormalities. Ultrasound examination of the right axilla demonstrates persistent grossly abnormal lymph nodes, consistent with patient's diagnosis of CLL. IMPRESSION: Right breast 8 o'clock palpable firm enlarging mass. This mass was previously biopsied using ultrasound guidance demonstrating benign inflammatory changes. However given the increase in size and firm palpable nature of the abnormality, surgical excisional biopsy is recommended. Please note that the mass of question corresponds to the coil shaped post biopsy tissue marker. RECOMMENDATION: Surgical consultation to consider excisional biopsy of right breast 8 o'clock enlarging palpable mass. Multiple abnormal right axillary lymph nodes, consistent with the given diagnosis of CLL. No mammographic or sonographic abnormalities at the right breast 10 o'clock 5 cm from the nipple. I have discussed the findings and recommendations with the patient. Results were also provided in writing at the conclusion of the visit. If applicable, a reminder letter will be sent to the patient regarding the next appointment. BI-RADS CATEGORY  4: Suspicious. Electronically Signed   By: Fidela Salisbury M.D.   On: 04/27/2018 16:13   Mm Diag Breast Tomo Uni Right  Result Date: 04/27/2018 CLINICAL DATA:  Enlarging and tender right subareolar breast mass. Patient had a benign core needle biopsy of her right breast 8 o'clock subareolar mass demonstrating mixed inflammation with giant cell reaction, and ultrasound-guided core needle biopsy of her right breast 10 o'clock mass 3 cm from the nipple demonstrating benign fibrocystic changes. Patient also has a diagnosis of CLL, with  involvement of right axillary lymph nodes. EXAM: DIGITAL DIAGNOSTIC RIGHT MAMMOGRAM WITH CAD AND TOMO ULTRASOUND RIGHT BREAST COMPARISON:  Previous exam(s). ACR Breast Density Category b: There are scattered areas of fibroglandular density. FINDINGS: Mammographic views of the right breast demonstrate persistent ill-defined mass in the slightly lower outer right breast, anterior depth, containing coil shaped tissue marker. This mass corresponds to the mass labeled 8 o'clock on ultrasound, demonstrating mixed inflammation with giant cell reaction. This mass measures approximately 1.7 cm mammographically. No other suspicious masses are seen. A second post biopsy marker is seen in the right breast upper outer quadrant from benign core needle biopsy of the right breast 10 o'clock 3 cm from the nipple mass. Mammographic images were processed with CAD. On physical exam, there is a firm fixed palpable mass in the subareolar right 8 o'clock breast, which corresponds to the area of tenderness and palpable concern described by the patient. Targeted ultrasound is performed, showing right breast 8 o'clock 1 cm hypoechoic mass with irregular borders containing post biopsy tissue marker. The mass measures 1.3 x 0.8 x  1.8 cm. Previously it measured 1.2 x 0.8 x 1.3 cm. Ultrasound examination of the right breast 10 o'clock 5 cm from the nipple demonstrates no appreciable abnormalities. Ultrasound examination of the right axilla demonstrates persistent grossly abnormal lymph nodes, consistent with patient's diagnosis of CLL. IMPRESSION: Right breast 8 o'clock palpable firm enlarging mass. This mass was previously biopsied using ultrasound guidance demonstrating benign inflammatory changes. However given the increase in size and firm palpable nature of the abnormality, surgical excisional biopsy is recommended. Please note that the mass of question corresponds to the coil shaped post biopsy tissue marker. RECOMMENDATION: Surgical  consultation to consider excisional biopsy of right breast 8 o'clock enlarging palpable mass. Multiple abnormal right axillary lymph nodes, consistent with the given diagnosis of CLL. No mammographic or sonographic abnormalities at the right breast 10 o'clock 5 cm from the nipple. I have discussed the findings and recommendations with the patient. Results were also provided in writing at the conclusion of the visit. If applicable, a reminder letter will be sent to the patient regarding the next appointment. BI-RADS CATEGORY  4: Suspicious. Electronically Signed   By: Fidela Salisbury M.D.   On: 04/27/2018 16:13    ASSESSMENT: CLL, Rai stage 1, breast mass  PLAN:    1. CLL: Confirmed by peripheral blood flow cytometry.  Patient completed cycle 3 of Rituxan plus Treanda on March 26, 2018.  CT scan results reviewed independently and report as above with essentially stable disease.  No intervention is needed at this time.  Return to clinic in 3 months with repeat imaging and further evaluation. 2.  Breast mass: Appreciate surgical input.  Thought to be inflammatory.  Continue NSAIDs as recommended and follow-up with surgery as scheduled. 3.  UTI: Continue monitoring and treatment per primary care. 4.  Leukocytosis: Secondary to CLL, resolved.    Patient expressed understanding and was in agreement with this plan. She also understands that She can call clinic at any time with any questions, concerns, or complaints.    Lloyd Huger, MD   05/08/2018 1:51 PM

## 2018-05-06 ENCOUNTER — Encounter: Payer: Self-pay | Admitting: Family Medicine

## 2018-05-06 ENCOUNTER — Ambulatory Visit (INDEPENDENT_AMBULATORY_CARE_PROVIDER_SITE_OTHER): Payer: BLUE CROSS/BLUE SHIELD | Admitting: Family Medicine

## 2018-05-06 ENCOUNTER — Ambulatory Visit
Admission: RE | Admit: 2018-05-06 | Discharge: 2018-05-06 | Disposition: A | Discharge status: Home / Self Care | Payer: BLUE CROSS/BLUE SHIELD | Source: Ambulatory Visit | Attending: Oncology | Admitting: Oncology

## 2018-05-06 VITALS — BP 110/70 | HR 91 | Temp 98.2°F | Resp 14 | Ht 67.0 in | Wt 150.9 lb

## 2018-05-06 DIAGNOSIS — Z716 Tobacco abuse counseling: Secondary | ICD-10-CM | POA: Diagnosis not present

## 2018-05-06 DIAGNOSIS — C9190 Lymphoid leukemia, unspecified not having achieved remission: Secondary | ICD-10-CM

## 2018-05-06 DIAGNOSIS — C9110 Chronic lymphocytic leukemia of B-cell type not having achieved remission: Secondary | ICD-10-CM

## 2018-05-06 DIAGNOSIS — I700 Atherosclerosis of aorta: Secondary | ICD-10-CM | POA: Insufficient documentation

## 2018-05-06 DIAGNOSIS — N3001 Acute cystitis with hematuria: Secondary | ICD-10-CM | POA: Diagnosis not present

## 2018-05-06 DIAGNOSIS — Z720 Tobacco use: Secondary | ICD-10-CM | POA: Diagnosis not present

## 2018-05-06 LAB — POCT URINALYSIS DIPSTICK
Bilirubin, UA: NEGATIVE
Glucose, UA: NEGATIVE
KETONES UA: NEGATIVE
NITRITE UA: NEGATIVE
PROTEIN UA: POSITIVE — AB
Spec Grav, UA: >=1.03 — AB (ref 1.010–1.025)
Urobilinogen, UA: 0.2 E.U./dL
pH, UA: 5 (ref 5.0–8.0)

## 2018-05-06 MED ORDER — SULFAMETHOXAZOLE-TRIMETHOPRIM 800-160 MG PO TABS: 1.0000 | ORAL_TABLET | Freq: Two times a day (BID) | ORAL | 0 refills | Status: AC

## 2018-05-06 MED ORDER — VARENICLINE TARTRATE 0.5 MG X 11 & 1 MG X 42 PO MISC: ORAL | 0 refills | Status: DC

## 2018-05-06 MED ORDER — IOPAMIDOL (ISOVUE-300) INJECTION 61%
100.0000 mL | Freq: Once | INTRAVENOUS | Status: AC | PRN
  Administered 2018-05-06: 100 mL via INTRAVENOUS

## 2018-05-06 NOTE — Patient Instructions (Signed)
Coping with Quitting Smoking Quitting smoking is a physical and mental challenge. You will face cravings, withdrawal symptoms, and temptation. Before quitting, work with your health care provider to make a plan that can help you cope. Preparation can help you quit and keep you from giving in. How can I cope with cravings? Cravings usually last for 5-10 minutes. If you get through it, the craving will pass. Consider taking the following actions to help you cope with cravings:  Keep your mouth busy: ? Chew sugar-free gum. ? Suck on hard candies or a straw. ? Brush your teeth.  Keep your hands and body busy: ? Immediately change to a different activity when you feel a craving. ? Squeeze or play with a ball. ? Do an activity or a hobby, like making bead jewelry, practicing needlepoint, or working with wood. ? Mix up your normal routine. ? Take a short exercise break. Go for a quick walk or run up and down stairs. ? Spend time in public places where smoking is not allowed.  Focus on doing something kind or helpful for someone else.  Call a friend or family member to talk during a craving.  Join a support group.  Call a quit line, such as 1-800-QUIT-NOW.  Talk with your health care provider about medicines that might help you cope with cravings and make quitting easier for you.  How can I deal with withdrawal symptoms? Your body may experience negative effects as it tries to get used to not having nicotine in the system. These effects are called withdrawal symptoms. They may include:  Feeling hungrier than normal.  Trouble concentrating.  Irritability.  Trouble sleeping.  Feeling depressed.  Restlessness and agitation.  Craving a cigarette.  To manage withdrawal symptoms:  Avoid places, people, and activities that trigger your cravings.  Remember why you want to quit.  Get plenty of sleep.  Avoid coffee and other caffeinated drinks. These may worsen some of your  symptoms.  How can I handle social situations? Social situations can be difficult when you are quitting smoking, especially in the first few weeks. To manage this, you can:  Avoid parties, bars, and other social situations where people might be smoking.  Avoid alcohol.  Leave right away if you have the urge to smoke.  Explain to your family and friends that you are quitting smoking. Ask for understanding and support.  Plan activities with friends or family where smoking is not an option.  What are some ways I can cope with stress? Wanting to smoke may cause stress, and stress can make you want to smoke. Find ways to manage your stress. Relaxation techniques can help. For example:  Breathe slowly and deeply, in through your nose and out through your mouth.  Listen to soothing, relaxing music.  Talk with a family member or friend about your stress.  Light a candle.  Soak in a bath or take a shower.  Think about a peaceful place.  What are some ways I can prevent weight gain? Be aware that many people gain weight after they quit smoking. However, not everyone does. To keep from gaining weight, have a plan in place before you quit and stick to the plan after you quit. Your plan should include:  Having healthy snacks. When you have a craving, it may help to: ? Eat plain popcorn, crunchy carrots, celery, or other cut vegetables. ? Chew sugar-free gum.  Changing how you eat: ? Eat small portion sizes at meals. ?   Eat 4-6 small meals throughout the day instead of 1-2 large meals a day. ? Be mindful when you eat. Do not watch television or do other things that might distract you as you eat.  Exercising regularly: ? Make time to exercise each day. If you do not have time for a long workout, do short bouts of exercise for 5-10 minutes several times a day. ? Do some form of strengthening exercise, like weight lifting, and some form of aerobic exercise, like running or  swimming.  Drinking plenty of water or other low-calorie or no-calorie drinks. Drink 6-8 glasses of water daily, or as much as instructed by your health care provider.  Summary  Quitting smoking is a physical and mental challenge. You will face cravings, withdrawal symptoms, and temptation to smoke again. Preparation can help you as you go through these challenges.  You can cope with cravings by keeping your mouth busy (such as by chewing gum), keeping your body and hands busy, and making calls to family, friends, or a helpline for people who want to quit smoking.  You can cope with withdrawal symptoms by avoiding places where people smoke, avoiding drinks with caffeine, and getting plenty of rest.  Ask your health care provider about the different ways to prevent weight gain, avoid stress, and handle social situations. This information is not intended to replace advice given to you by your health care provider. Make sure you discuss any questions you have with your health care provider. Document Released: 08/30/2016 Document Revised: 08/30/2016 Document Reviewed: 08/30/2016 Elsevier Interactive Patient Education  2018 [employer].  Urinary Tract Infection, Adult A urinary tract infection (UTI) is an infection of any part of the urinary tract. The urinary tract includes the:  Kidneys.  Ureters.  Bladder.  Urethra.  These organs make, store, and get rid of pee (urine) in the body. Follow these instructions at home:  Take over-the-counter and prescription medicines only as told by your doctor.  If you were prescribed an antibiotic medicine, take it as told by your doctor. Do not stop taking the antibiotic even if you start to feel better.  Avoid the following drinks: ? Alcohol. ? Caffeine. ? Tea. ? Carbonated drinks.  Drink enough fluid to keep your pee clear or pale yellow.  Keep all follow-up visits as told by your doctor. This is important.  Make sure to: ? Empty your  bladder often and completely. Do not to hold pee for long periods of time. ? Empty your bladder before and after sex. ? Wipe from front to back after a bowel movement if you are female. Use each tissue one time when you wipe. Contact a doctor if:  You have back pain.  You have a fever.  You feel sick to your stomach (nauseous).  You throw up (vomit).  Your symptoms do not get better after 3 days.  Your symptoms go away and then come back. Get help right away if:  You have very bad back pain.  You have very bad lower belly (abdominal) pain.  You are throwing up and cannot keep down any medicines or water. This information is not intended to replace advice given to you by your health care provider. Make sure you discuss any questions you have with your health care provider. Document Released: 02/19/2008 Document Revised: 02/08/2016 Document Reviewed: 07/24/2015 Elsevier Interactive Patient Education  [employer].

## 2018-05-06 NOTE — Addendum Note (Signed)
Addended by: Hubbard Hartshorn on: 05/06/2018 08:47 AM   Modules accepted: Orders, Level of Service

## 2018-05-06 NOTE — Progress Notes (Addendum)
Name: Monique [name]   MRN: [id number]    DOB: Apr 26, 1971   Date:05/06/2018       Progress Note  Subjective  Chief Complaint  Chief Complaint  Patient presents with  . Nicotine Dependence    wuld like to try Chantix  . Urinary Tract Infection    pressure since yesterday    HPI  Pt presents with the following concerns:  Tobacco abuse:  Would like to try Chantix - she has started it before back in April or May 2019 but was under increased stress and began smoking again. She currently smokes 1.5 packs per day. Cessation Techniques Discussed: removing cigarettes and smoking materials from environment, stress management, support of family/friends, written materials and pharmacotherapy (will start chantix today) Educational material distributed. I spent approximately 10 minutes counseling the patient. Does have history of emphysema.  Possible UTI: Pt reports increased pressure in her bladder and urinary frequency.  She does have history of stress incontinence, no history of kidney stones. Denies abdominal pain, flank pain, no abnormal back pain, no nausea/vomiting/diarrhea, fevers or chills. Pertinent History: She recently completed treatment for CLL - she is under the care of [name].  Discussed how treatment puts her at higher risk for complex infection.  Hematuria is present in the urine - we will recheck in 2 weeks to ensure resolution and refer to urology if not resolved. She does have CT scan of abdomen/pelvis scheduled for today with the cancer center as well.   Patient Active Problem List   Diagnosis Date Noted  . Mastalgia 05/05/2018  . Breast mass, right 01/07/2018  . Thoracic aortic atherosclerosis ([place]) 08/20/2017  . Emphysema of lung ([place]) 08/20/2017  . Low back pain 07/02/2017  . Adductor tendinitis 04/23/2017  . Trochanteric bursitis of right hip 04/23/2017  . GERD without esophagitis 12/11/2016  . CLL (chronic lymphocytic leukemia) ([place]) 11/24/2016  . Pap  smear abnormality of cervix/human papillomavirus (HPV) positive 09/12/2016  . Stress incontinence 09/04/2016  . B12 deficiency 07/10/2015  . Insomnia, persistent 07/10/2015  . Mild episode of recurrent major depressive disorder ([place]) 07/10/2015  . Anorexia nervosa, restricting type 07/10/2015  . Anxiety, generalized 07/10/2015  . H/O suicide attempt 07/10/2015  . Lymphocytosis 07/10/2015  . Obsessive-compulsive disorder 07/10/2015  . Tobacco use 07/10/2015  . History of cervical dysplasia 07/18/2014    Past Surgical History:  Procedure Laterality Date  . BREAST BIOPSY Right 01/05/2018   US guided biopsy of 2 areas and 1 lymph node, MIXED INFLAMMATION AND GIANT CELL REACTION  . CERVICAL BIOPSY  W/ LOOP ELECTRODE EXCISION    . TUBAL LIGATION      Family History  Problem Relation Age of Onset  . Cancer Mother        thyroid  . Alcohol abuse Father   . Depression Sister   . Cancer Sister        cevical  . Alcohol abuse Brother   . Depression Brother   . Bipolar disorder Brother   . ADD / ADHD Son   . Breast cancer Neg Hx     Social History   Socioeconomic History  . Marital status: Divorced    Spouse name: Not on file  . Number of children: Not on file  . Years of education: Not on file  . Highest education level: Not on file  Occupational History  . Occupation: Best boy: [employer]  . Financial resource strain: Not on file  .  Food insecurity:    Worry: Not on file    Inability: Not on file  . Transportation needs:    Medical: Not on file    Non-medical: Not on file  Tobacco Use  . Smoking status: Current Every Day Smoker    Packs/day: 1.00    Years: 31.00    Pack years: 31.00    Start date: 09/25/1986  . Smokeless tobacco: Never Used  . Tobacco comment: Would like to discuss Chantix- she forgot to take this medication  Substance and Sexual Activity  . Alcohol use: Yes    Alcohol/week: 0.0 standard drinks    Comment: rarely  .  Drug use: Yes    Types: Marijuana  . Sexual activity: Yes    Partners: Male    Birth control/protection: None  Lifestyle  . Physical activity:    Days per week: Not on file    Minutes per session: Not on file  . Stress: Not on file  Relationships  . Social connections:    Talks on phone: Not on file    Gets together: Not on file    Attends religious service: Not on file    Active member of club or organization: Not on file    Attends meetings of clubs or organizations: Not on file    Relationship status: Not on file  . Intimate partner violence:    Fear of current or ex partner: Not on file    Emotionally abused: Not on file    Physically abused: Not on file    Forced sexual activity: Not on file  Other Topics Concern  . Not on file  Social History Narrative  . Not on file     Current Outpatient Medications:  .  Albuterol Sulfate (PROAIR RESPICLICK) 101 (90 Base) MCG/ACT AEPB, Inhale 1 puff into the lungs 3 (three) times daily as needed., Disp: 1 each, Rfl: 0 .  escitalopram (LEXAPRO) 10 MG tablet, as needed. , Disp: , Rfl:  .  gabapentin (NEURONTIN) 100 MG capsule, Take 100 mg twice a day for one week, then increase to 200 mg(2 tablets) twice a day and continue, Disp: , Rfl:  .  HYDROcodone-acetaminophen (NORCO/VICODIN) 5-325 MG tablet, every 6 (six) hours as needed. , Disp: , Rfl:  .  traMADol (ULTRAM) 50 MG tablet, Take by mouth every 6 (six) hours as needed., Disp: , Rfl:  .  umeclidinium-vilanterol (ANORO ELLIPTA) 62.5-25 MCG/INH AEPB, TAKE 1 PUFF BY MOUTH EVERY DAY, Disp: 60 each, Rfl: 5 .  ondansetron (ZOFRAN) 8 MG tablet, Take 1 tablet (8 mg total) by mouth 2 (two) times daily as needed for refractory nausea / vomiting. (Patient not taking: Reported on 04/22/2018), Disp: 30 tablet, Rfl: 2 .  prochlorperazine (COMPAZINE) 10 MG tablet, Take 1 tablet (10 mg total) by mouth every 6 (six) hours as needed (Nausea or vomiting). (Patient not taking: Reported on 04/22/2018), Disp: 60  tablet, Rfl: 2 .  sulfamethoxazole-trimethoprim (BACTRIM DS,SEPTRA DS) 800-160 MG tablet, Take 1 tablet by mouth 2 (two) times daily for 3 days., Disp: 6 tablet, Rfl: 0 .  varenicline (CHANTIX STARTING MONTH PAK) 0.5 MG X 11 & 1 MG X 42 tablet, Take one 0.5 mg tablet by mouth once daily for 3 days, then increase to one 0.5 mg tablet twice daily for 4 days, then increase to one 1 mg tablet twice daily., Disp: 53 tablet, Rfl: 0  No Known Allergies  ROS  Ten systems reviewed and is negative except as mentioned  in HPI.  Objective  Vitals:   05/06/18 0812  BP: 110/70  Pulse: 91  Resp: 14  Temp: 98.2 F (36.8 C)  TempSrc: Oral  SpO2: 97%  Weight: 150 lb 14.4 oz (68.4 kg)  Height: 5\' 7"  (1.702 m)   Body mass index is 23.63 kg/m.  Physical Exam Constitutional: Patient appears well-developed and well-nourished. No distress.  HENT: Head: Normocephalic and atraumatic.  Eyes: Conjunctivae and EOM are normal. Pupils are equal, round, and reactive to light. No scleral icterus.  Neck: Normal range of motion. Neck supple. No JVD present. No thyromegaly present.  Cardiovascular: Normal rate, regular rhythm and normal heart sounds.  No murmur heard. No BLE edema. Pulmonary/Chest: Effort normal and breath sounds normal. No respiratory distress. Abdominal: Soft. Bowel sounds are normal, no distension. There is mild LLQ tenderness. no masses palpated, no CVA tenderness. Neurological: she is alert and oriented to person, place, and time. No cranial nerve deficit. Coordination, balance, strength, speech and gait are normal.  Skin: Skin is warm and dry. No rash noted. No erythema.  Psychiatric: Patient has a normal mood and affect. behavior is normal. Judgment and thought content normal.  Results for orders placed or performed in visit on 05/06/18 (from the past 72 hour(s))  POCT urinalysis dipstick     Status: Abnormal   Collection Time: 05/06/18  8:23 AM  Result Value Ref Range   Color, UA gold     Clarity, UA cloudy    Glucose, UA Negative Negative   Bilirubin, UA negative    Ketones, UA negative    Spec Grav, UA >=1.030 (A) 1.010 - 1.025   Blood, UA large    pH, UA 5.0 5.0 - 8.0   Protein, UA Positive (A) Negative   Urobilinogen, UA 0.2 0.2 or 1.0 E.U./dL   Nitrite, UA negative    Leukocytes, UA Large (3+) (A) Negative   Appearance cloudy    Odor none    PHQ2/9: Depression screen [facility] 2/9 05/06/2018 12/04/2017 12/11/2016 09/04/2016 08/14/2016  Decreased Interest 0 1 0 0 1  Down, Depressed, Hopeless 0 0 1 0 1  PHQ - 2 Score 0 1 1 0 2  Altered sleeping 1 2 - 0 1  Tired, decreased energy 1 2 - 0 0  Change in appetite 0 1 - 0 0  Feeling bad or failure about yourself  0 0 - 0 0  Trouble concentrating 0 1 - 0 0  Moving slowly or fidgety/restless 0 0 - 0 0  Suicidal thoughts 0 0 - 0 0  PHQ-9 Score 2 7 - 0 3  Difficult doing work/chores - Not difficult at all - Not difficult at all Somewhat difficult   Fall Risk: Fall Risk  05/06/2018 02/25/2018 01/29/2018 01/28/2018 12/04/2017  Falls in the past year? No No No No No   Assessment & Plan  1. Acute cystitis with hematuria - POCT urinalysis dipstick - sulfamethoxazole-trimethoprim (BACTRIM DS,SEPTRA DS) 800-160 MG tablet; Take 1 tablet by mouth 2 (two) times daily for 3 days.  Dispense: 6 tablet; Refill: 0 - Urine Culture  2. Tobacco use - varenicline (CHANTIX STARTING MONTH PAK) 0.5 MG X 11 & 1 MG X 42 tablet; Take one 0.5 mg tablet by mouth once daily for 3 days, then increase to one 0.5 mg tablet twice daily for 4 days, then increase to one 1 mg tablet twice daily.  Dispense: 53 tablet; Refill: 0 - Smoking cessation instruction/counseling given:  counseled patient on the dangers of  tobacco use, advised patient to stop smoking, and reviewed strategies to maximize success including starting Chantix and choosing a quit date; obtaining support from family and friends, and encouraging stress reduction.  3. CLL (chronic lymphocytic  leukemia) ([place]) - Discussed increased risk for complex infection, therefore we will treat for UTI today and confirm with urine culture.  She does have CT abdomen/pelvis today with the cancer center, however I did advise she return in 2 weeks for UA recheck (nurse visit), and if hematuria is still present, we will refer to urology and notify [name].

## 2018-05-07 ENCOUNTER — Inpatient Hospital Stay (HOSPITAL_BASED_OUTPATIENT_CLINIC_OR_DEPARTMENT_OTHER): Payer: BLUE CROSS/BLUE SHIELD | Admitting: Oncology

## 2018-05-07 ENCOUNTER — Inpatient Hospital Stay: Payer: BLUE CROSS/BLUE SHIELD

## 2018-05-07 ENCOUNTER — Other Ambulatory Visit: Payer: Self-pay

## 2018-05-07 VITALS — BP 98/63 | HR 80 | Temp 96.8°F | Resp 18 | Wt 149.0 lb

## 2018-05-07 DIAGNOSIS — C9110 Chronic lymphocytic leukemia of B-cell type not having achieved remission: Secondary | ICD-10-CM | POA: Diagnosis not present

## 2018-05-07 DIAGNOSIS — N6310 Unspecified lump in the right breast, unspecified quadrant: Secondary | ICD-10-CM

## 2018-05-07 DIAGNOSIS — Z720 Tobacco use: Secondary | ICD-10-CM

## 2018-05-07 DIAGNOSIS — N390 Urinary tract infection, site not specified: Secondary | ICD-10-CM | POA: Diagnosis not present

## 2018-05-07 LAB — COMPREHENSIVE METABOLIC PANEL
ALBUMIN: 3.7 g/dL (ref 3.5–5.0)
ALK PHOS: 53 U/L (ref 38–126)
ALT: 14 U/L (ref 0–44)
AST: 20 U/L (ref 15–41)
Anion gap: 7 (ref 5–15)
BILIRUBIN TOTAL: 0.4 mg/dL (ref 0.3–1.2)
BUN: 16 mg/dL (ref 6–20)
CALCIUM: 8.4 mg/dL — AB (ref 8.9–10.3)
CO2: 24 mmol/L (ref 22–32)
Chloride: 107 mmol/L (ref 98–111)
Creatinine, Ser: 0.81 mg/dL (ref 0.44–1.00)
GFR calc Af Amer: >60 mL/min (ref >60)
GFR calc non Af Amer: >60 mL/min (ref >60)
GLUCOSE: 132 mg/dL — AB (ref 70–99)
Potassium: 4.5 mmol/L (ref 3.5–5.1)
SODIUM: 138 mmol/L (ref 135–145)
TOTAL PROTEIN: 5.9 g/dL — AB (ref 6.5–8.1)

## 2018-05-07 LAB — CBC WITH DIFFERENTIAL/PLATELET
BASOS PCT: 1 %
Basophils Absolute: 0 10*3/uL (ref 0–0.1)
EOS ABS: 0.1 10*3/uL (ref 0–0.7)
EOS PCT: 1 %
HCT: 38.9 % (ref 35.0–47.0)
HEMOGLOBIN: 13.2 g/dL (ref 12.0–16.0)
Lymphocytes Relative: 50 %
Lymphs Abs: 2.5 10*3/uL (ref 1.0–3.6)
MCH: 33.5 pg (ref 26.0–34.0)
MCHC: 33.9 g/dL (ref 32.0–36.0)
MCV: 98.8 fL (ref 80.0–100.0)
MONOS PCT: 6 %
Monocytes Absolute: 0.3 10*3/uL (ref 0.2–0.9)
NEUTROS PCT: 42 %
Neutro Abs: 2.1 10*3/uL (ref 1.4–6.5)
PLATELETS: 173 10*3/uL (ref 150–440)
RBC: 3.94 MIL/uL (ref 3.80–5.20)
RDW: 13.3 % (ref 11.5–14.5)
WBC: 5 10*3/uL (ref 3.6–11.0)

## 2018-05-07 NOTE — Progress Notes (Signed)
Here for follow up. Per pt yesterday dx w UTI -started ABX   Overall stated she feels well.  Per pt taking aleve 2 tabs bid x 3 d -then bid for breast lump- R breast. -

## 2018-05-08 LAB — URINE CULTURE
MICRO NUMBER:: 90996812
SPECIMEN QUALITY:: ADEQUATE

## 2018-05-13 ENCOUNTER — Ambulatory Visit: Payer: BLUE CROSS/BLUE SHIELD | Attending: Nurse Practitioner | Admitting: Nurse Practitioner

## 2018-05-13 ENCOUNTER — Other Ambulatory Visit: Payer: Self-pay

## 2018-05-13 ENCOUNTER — Encounter: Payer: Self-pay | Admitting: Nurse Practitioner

## 2018-05-13 DIAGNOSIS — G894 Chronic pain syndrome: Secondary | ICD-10-CM | POA: Diagnosis not present

## 2018-05-13 DIAGNOSIS — J439 Emphysema, unspecified: Secondary | ICD-10-CM | POA: Insufficient documentation

## 2018-05-13 DIAGNOSIS — G4700 Insomnia, unspecified: Secondary | ICD-10-CM | POA: Insufficient documentation

## 2018-05-13 DIAGNOSIS — Z79899 Other long term (current) drug therapy: Secondary | ICD-10-CM | POA: Diagnosis not present

## 2018-05-13 DIAGNOSIS — M899 Disorder of bone, unspecified: Secondary | ICD-10-CM | POA: Insufficient documentation

## 2018-05-13 DIAGNOSIS — M5126 Other intervertebral disc displacement, lumbar region: Secondary | ICD-10-CM | POA: Diagnosis not present

## 2018-05-13 DIAGNOSIS — K219 Gastro-esophageal reflux disease without esophagitis: Secondary | ICD-10-CM | POA: Insufficient documentation

## 2018-05-13 DIAGNOSIS — F5000 Anorexia nervosa, unspecified: Secondary | ICD-10-CM | POA: Insufficient documentation

## 2018-05-13 DIAGNOSIS — M5441 Lumbago with sciatica, right side: Secondary | ICD-10-CM | POA: Diagnosis not present

## 2018-05-13 DIAGNOSIS — M79605 Pain in left leg: Secondary | ICD-10-CM | POA: Diagnosis present

## 2018-05-13 DIAGNOSIS — F429 Obsessive-compulsive disorder, unspecified: Secondary | ICD-10-CM | POA: Insufficient documentation

## 2018-05-13 DIAGNOSIS — M79604 Pain in right leg: Secondary | ICD-10-CM | POA: Diagnosis not present

## 2018-05-13 DIAGNOSIS — F1190 Opioid use, unspecified, uncomplicated: Secondary | ICD-10-CM | POA: Insufficient documentation

## 2018-05-13 DIAGNOSIS — M5442 Lumbago with sciatica, left side: Secondary | ICD-10-CM | POA: Insufficient documentation

## 2018-05-13 DIAGNOSIS — F339 Major depressive disorder, recurrent, unspecified: Secondary | ICD-10-CM | POA: Insufficient documentation

## 2018-05-13 DIAGNOSIS — E538 Deficiency of other specified B group vitamins: Secondary | ICD-10-CM | POA: Diagnosis not present

## 2018-05-13 DIAGNOSIS — Z6823 Body mass index (BMI) 23.0-23.9, adult: Secondary | ICD-10-CM | POA: Insufficient documentation

## 2018-05-13 DIAGNOSIS — F411 Generalized anxiety disorder: Secondary | ICD-10-CM | POA: Diagnosis not present

## 2018-05-13 DIAGNOSIS — Z789 Other specified health status: Secondary | ICD-10-CM | POA: Insufficient documentation

## 2018-05-13 DIAGNOSIS — I700 Atherosclerosis of aorta: Secondary | ICD-10-CM | POA: Insufficient documentation

## 2018-05-13 DIAGNOSIS — M7061 Trochanteric bursitis, right hip: Secondary | ICD-10-CM | POA: Diagnosis not present

## 2018-05-13 DIAGNOSIS — N393 Stress incontinence (female) (male): Secondary | ICD-10-CM | POA: Insufficient documentation

## 2018-05-13 DIAGNOSIS — C9110 Chronic lymphocytic leukemia of B-cell type not having achieved remission: Secondary | ICD-10-CM | POA: Insufficient documentation

## 2018-05-13 DIAGNOSIS — G8929 Other chronic pain: Secondary | ICD-10-CM | POA: Insufficient documentation

## 2018-05-13 NOTE — Progress Notes (Signed)
Safety precautions to be maintained throughout the outpatient stay will include: orient to surroundings, keep bed in low position, maintain call bell within reach at all times, provide assistance with transfer out of bed and ambulation.  

## 2018-05-13 NOTE — Patient Instructions (Signed)

## 2018-05-13 NOTE — Progress Notes (Signed)
Patient's Name: Monique [name]  MRN: [id number]  Referring Provider: Genice Rouge, PA-C  DOB: 10/04/1970  PCP: [name], MD  DOS: 05/13/2018  Note by: Dionisio David NP  Service setting: Ambulatory outpatient  Specialty: Interventional Pain Management  Location: [facility] (AMB) Pain Management Facility    Patient type: New Patient    Primary Reason(s) for Visit: Initial Patient Evaluation CC: Leg Pain (bilateral) and Hip Pain (right)  HPI  Monique [name] is a 47 y.o. year old, female patient, who comes today for an initial evaluation. She has B12 deficiency; Insomnia, persistent; Mild episode of recurrent major depressive disorder ([place]); Anorexia nervosa, restricting type; Anxiety, generalized; H/O suicide attempt; Lymphocytosis; Obsessive-compulsive disorder; Tobacco use; History of cervical dysplasia; Stress incontinence; CLL (chronic lymphocytic leukemia) ([place]); Pap smear abnormality of cervix/human papillomavirus (HPV) positive; GERD without esophagitis; Adductor tendinitis; Low back pain; Trochanteric bursitis of right hip; Thoracic aortic atherosclerosis ([place]); Emphysema of lung ([place]); Breast mass, right; Mastalgia; Facial pain; Tingling; Chronic pain of both lower extremities (Primary Area of Pain) (R>L); Chronic bilateral low back pain with bilateral sciatica (Secondary Area of Pain) (R>L); Chronic pain syndrome; Opiate use; Disorder of skeletal system; Pharmacologic therapy; and Problems influencing health status on their problem list.. Her primarily concern today is the Leg Pain (bilateral) and Hip Pain (right)  Pain Assessment: Location: Right, Left Leg Radiating: denies Onset: More than a month ago Duration: Chronic pain(worse when tired or with prolonged standing) Quality: Aching, Burning, Tingling Severity: 0-No pain/10 (subjective, self-reported pain score)  Note: Reported level is compatible with observation.                          Effect on ADL: unable to perform daily  activities Timing: Intermittent Modifying factors: rest, Gabapenitin, Hydrocodone BP: 109/71  HR: 70  Onset and Duration: Present longer than 3 months Cause of pain: Unknown Severity: NAS-11 at its worse: 10/10, NAS-11 at its best: 0/10, NAS-11 now: 0/10 and NAS-11 on the average: 4/10 Timing: Not influenced by the time of the day, During activity or exercise and After activity or exercise Aggravating Factors: Climbing, Kneeling, Prolonged sitting, Prolonged standing, Squatting, Walking and Working Alleviating Factors: Medications and Resting Associated Problems: Day-time cramps, Night-time cramps, Depression, Numbness, Spasms, Sweating, Swelling, Tingling, Weakness, Pain that wakes patient up and Pain that does not allow patient to sleep Quality of Pain: Aching, Agonizing, Annoying, Burning, Constant, Cramping, Deep, Disabling, Dreadful, Exhausting, Heavy, Horrible, Nagging, Sharp, Shooting, Stabbing, Throbbing, Tingling and Uncomfortable Previous Examinations or Tests: Biopsy, CT scan, MRI scan, X-rays, Neurological evaluation and Orthopedic evaluation Previous Treatments: Chiropractic manipulations, Narcotic medications, Steroid treatments by mouth and Trigger point injections  The patient comes into the clinics today for the first time for a chronic pain management evaluation.  According to the patient her primary area of pain is in her legs.  She admits that the pain varies and involves both legs in every area it just pains.  She does have tingling and tightness.  He admits that this pain started approximately 1 year ago after being treated with chemotherapy for chronic lymphocytic leukemia.  She admits that she has been seen by orthopedist and was given 2 injections which were effective.  She denies any formal physical therapy.  She denies a previous nerve conduction study.  She admits that she is currently on gabapentin however she only takes it as needed.  Her second area pain would be  considered her lower back.  She  does not have a low back pain mostly leg pain.  She admits that the pain is worse when she works.  She is a Freight forwarder at [employer].  She denies any previous surgery.  Interventional therapy that was stated above unknown.  She denies physical therapy but states she was told to wear a brace which she did for over a year while working.  She admits that she has had a recent MRI.   Today I took the time to provide the patient with information regarding this pain practice. The patient was informed that the practice is divided into two sections: an interventional pain management section, as well as a completely separate and distinct medication management section. I explained that there are procedure days for interventional therapies, and evaluation days for follow-ups and medication management. Because of the amount of documentation required during both, they are kept separated. This means that there is the possibility that she may be scheduled for a procedure on one day, and medication management the next. I have also informed her that because of staffing and facility limitations, this practice will no longer take patients for medication management only. To illustrate the reasons for this, I gave the patient the example of surgeons, and how inappropriate it would be to refer a patient to his/her care, just to write for the post-surgical antibiotics on a surgery done by a different surgeon.   Because interventional pain management is part of the board-certified specialty for the doctors, the patient was informed that joining this practice means that they are open to any and all interventional therapies. I made it clear that this does not mean that they will be forced to have any procedures done. What this means is that I believe interventional therapies to be essential part of the diagnosis and proper management of chronic pain conditions. Therefore, patients not interested in these  interventional alternatives will be better served under the care of a different practitioner.  The patient was also made aware of my Comprehensive Pain Management Safety Guidelines where by joining this practice, they limit all of their nerve blocks and joint injections to those done by our practice, for as long as we are retained to manage their care. Historic Controlled Substance Pharmacotherapy Review  PMP and historical list of controlled substances: Oxycodone 5 mg, hydrocodone/acetaminophen 5/325 mg, hydrocodone/Chlorphen extended release suspension, tramadol 50 mg, oxycodone/acetaminophen 5/325 mg, hydrocodone/acetaminophen 5/300 mg Highest opioid analgesic regimen found: Oxycodone 5 mg 1 tablet 4 times daily (fill date 03/31/2018) oxycodone 20 mg/day Most recent opioid analgesic: Oxycodone 5 mg 1 tablet 4 times daily (fill date 03/31/2018) oxycodone 20 mg/day Current opioid analgesics: None Highest recorded MME/day: 30 mg/day MME/day: 0 mg/day Medications: The patient did not bring the medication(s) to the appointment, as requested in our "New Patient Package" Pharmacodynamics: Desired effects: Analgesia: The patient reports >50% benefit. Reported improvement in function: The patient reports medication allows her to accomplish basic ADLs. Clinically meaningful improvement in function (CMIF): Sustained CMIF goals met Perceived effectiveness: Described as relatively effective, allowing for increase in activities of daily living (ADL) Undesirable effects: Side-effects or Adverse reactions: None reported Historical Monitoring: The patient  reports that she has current or past drug history. Drug: Marijuana. List of all UDS Test(s): No results found for: MDMA, COCAINSCRNUR, PCPSCRNUR, PCPQUANT, CANNABQUANT, THCU, [place] List of all Serum Drug Screening Test(s):  No results found for: AMPHSCRSER, BARBSCRSER, BENZOSCRSER, COCAINSCRSER, PCPSCRSER, PCPQUANT, THCSCRSER, CANNABQUANT, OPIATESCRSER,  OXYSCRSER, PROPOXSCRSER Historical Background Evaluation: [place] PDMP: Six (6) year  initial data search conducted.            Overdose risk score [address] Department of public safety, offender search: Editor, commissioning Information) Non-contributory Risk Assessment Profile: Aberrant behavior: use of illicit substances Risk factors for fatal opioid overdose: age 75-54 years old, caucasian, history of substance abuse and nicotine dependence Fatal overdose hazard ratio (HR): Calculation deferred Non-fatal overdose hazard ratio (HR): Calculation deferred Risk of opioid abuse or dependence: 0.7-3.0% with doses ? 36 MME/day and 6.1-26% with doses ? 120 MME/day. Substance use disorder (SUD) risk level: Pending results of Medical Psychology Evaluation for SUD Opioid risk tool (ORT) (Total Score): 5  ORT Scoring interpretation table:  Score <3 = Low Risk for SUD  Score between 4-7 = Moderate Risk for SUD  Score >8 = High Risk for Opioid Abuse   PHQ-2 Depression Scale:  Total score: 0  PHQ-2 Scoring interpretation table: (Score and probability of major depressive disorder)  Score 0 = No depression  Score 1 = 15.4% Probability  Score 2 = 21.1% Probability  Score 3 = 38.4% Probability  Score 4 = 45.5% Probability  Score 5 = 56.4% Probability  Score 6 = 78.6% Probability   PHQ-9 Depression Scale:  Total score: 0  PHQ-9 Scoring interpretation table:  Score 0-4 = No depression  Score 5-9 = Mild depression  Score 10-14 = Moderate depression  Score 15-19 = Moderately severe depression  Score 20-27 = Severe depression (2.4 times higher risk of SUD and 2.89 times higher risk of overuse)   Pharmacologic Plan: Pending ordered tests and/or consults  Meds  The patient has a current medication list which includes the following prescription(s): escitalopram, gabapentin, hydrocodone-acetaminophen, tramadol, umeclidinium-vilanterol, varenicline, and prochlorperazine.  Current Outpatient Medications on File Prior to  Visit  Medication Sig  . escitalopram (LEXAPRO) 10 MG tablet as needed.   . gabapentin (NEURONTIN) 100 MG capsule Take 100 mg twice a day for one week, then increase to 200 mg(2 tablets) twice a day and continue  . HYDROcodone-acetaminophen (NORCO/VICODIN) 5-325 MG tablet every 6 (six) hours as needed.   . traMADol (ULTRAM) 50 MG tablet Take by mouth every 6 (six) hours as needed.  . umeclidinium-vilanterol (ANORO ELLIPTA) 62.5-25 MCG/INH AEPB TAKE 1 PUFF BY MOUTH EVERY DAY  . varenicline (CHANTIX STARTING MONTH PAK) 0.5 MG X 11 & 1 MG X 42 tablet Take one 0.5 mg tablet by mouth once daily for 3 days, then increase to one 0.5 mg tablet twice daily for 4 days, then increase to one 1 mg tablet twice daily.  . prochlorperazine (COMPAZINE) 10 MG tablet Take 1 tablet (10 mg total) by mouth every 6 (six) hours as needed (Nausea or vomiting). (Patient not taking: Reported on 04/22/2018)   No current facility-administered medications on file prior to visit.    Imaging Review  Lumbosacral Imaging:  Lumbar MR w/wo contrast:  Results for orders placed during the hospital encounter of 04/20/17  MR Lumbar Spine W Wo Contrast   Narrative CLINICAL DATA:  47 year old female diagnosed with chronic lymphocytic leukemia in February this year. Severe pain radiating to the right lower extremity.  EXAM: MRI LUMBAR SPINE WITHOUT AND WITH CONTRAST  TECHNIQUE: Multiplanar and multiecho pulse sequences of the lumbar spine were obtained without and with intravenous contrast.  CONTRAST:  78m MULTIHANCE GADOBENATE DIMEGLUMINE 529 MG/ML IV SOLN  COMPARISON:  CT chest abdomen and pelvis 01/21/2017.  FINDINGS: Segmentation: Normal aside from partially sacralized L5 level (right side assimilation joint) as demonstrated on  the comparison CT. Vestigial L5-S1 disc space. Full size ribs at T12.  Alignment:  Stable an normal lumbar vertebral height and alignment.  Vertebrae: Diffusely abnormal decreased T1 bone  marrow signal throughout the visible spine and pelvis (series 3, image 10 and series 6, image 40). No foci of abnormal increased STIR signal to suggest marrow edema. Following contrast there is uniform low level marrow enhancement, no discrete or destructive marrow lesion.  Conus medullaris: Extends to the T12-L1 level and appears normal. No abnormal intradural enhancement. Normal cauda equina nerve roots.  Paraspinal and other soft tissues: Bulky left greater than right retroperitoneal lymphadenopathy measures up to 3.5 cm short axis, and does not appear significantly changed since 01/21/2017 (series 7, image 28 today versus series 2, image 87 of the CT comparison). Partially visible bilateral iliac chains lymphadenopathy.  The low visible liver and kidneys appear within normal limits. Visible bowel loops are within normal limits. Negative visualized posterior paraspinal soft tissues.  Disc levels:  From T11-T12 to L3-L4 there is no significant lumbar disc degeneration.  There is a subtle right paracentral disc protrusion at L4-L5. The L5-S1 disc is normal.  Mild lower lumbar facet and ligament flavum hypertrophy at L3-L4 and L4-L5.  No lumbar spinal stenosis or convincing neural impingement.  IMPRESSION: 1. Bulky retroperitoneal lymphadenopathy, greater on the left, not significantly changed since 01/21/2017. 2. Diffusely abnormal bone marrow signal, but as there is no discrete or destructive osseous lesion I favor this is treatment related. Query anemia. 3. Otherwise no metastatic disease in the lumbar spine. 4. Minimal lumbar spine degeneration, with no spinal stenosis or convincing neural impingement.   Electronically Signed   By: Genevie Ann M.D.   On: 04/20/2017 17:10   Note: Available results from prior imaging studies were reviewed.        ROS  Cardiovascular History: No reported cardiovascular signs or symptoms such as High blood pressure, coronary artery  disease, abnormal heart rate or rhythm, heart attack, blood thinner therapy or heart weakness and/or failure Pulmonary or Respiratory History: Smoking, Coughing up mucus (Bronchitis) and Temporary stoppage of breathing during sleep Neurological History: No reported neurological signs or symptoms such as seizures, abnormal skin sensations, urinary and/or fecal incontinence, being born with an abnormal open spine and/or a tethered spinal cord   Review of Past Neurological Studies:  Results for orders placed or performed during the hospital encounter of 01/01/18  MR BRAIN W WO CONTRAST   Narrative   CLINICAL DATA:  47 y/o F; history of chronic lymphocytic leukemia. Posterior headache with sharp pains, left leg weakness, and right facial pain with numbness for 1 month.  EXAM: MRI HEAD WITHOUT AND WITH CONTRAST  TECHNIQUE: Multiplanar, multiecho pulse sequences of the brain and surrounding structures were obtained without and with intravenous contrast.  CONTRAST:  76m MULTIHANCE GADOBENATE DIMEGLUMINE 529 MG/ML IV SOLN  COMPARISON:  10/01/2017 CT of the neck.  FINDINGS: Brain: No acute infarction, hemorrhage, hydrocephalus, extra-axial collection or mass lesion. No abnormal enhancement.  Vascular: Normal flow voids.  Skull and upper cervical spine: Normal marrow signal.  Sinuses/Orbits: Negative.  Other: Left suboccipital scalp 12 mm low signal focus with calcifications on CT neck, likely sebaceous cyst. Partially visualized prominent upper cervical lymph nodes compatible with history of CLL.  IMPRESSION: 1. No acute intracranial abnormality.  Normal MRI of the brain. 2. Enlarged upper cervical lymph nodes, partially visualized, compatible with history of CLL.   Electronically Signed   By: LEdgardo RoysD.  On: 01/01/2018 13:52    Psychological-Psychiatric History: Anxiousness and Depressed Gastrointestinal History: No reported gastrointestinal signs or  symptoms such as vomiting or evacuating blood, reflux, heartburn, alternating episodes of diarrhea and constipation, inflamed or scarred liver, or pancreas or irrregular and/or infrequent bowel movements Genitourinary History: No reported renal or genitourinary signs or symptoms such as difficulty voiding or producing urine, peeing blood, non-functioning kidney, kidney stones, difficulty emptying the bladder, difficulty controlling the flow of urine, or chronic kidney disease Hematological History: Weakness due to low blood hemoglobin or red blood cell count (Anemia) Endocrine History: No reported endocrine signs or symptoms such as high or low blood sugar, rapid heart rate due to high thyroid levels, obesity or weight gain due to slow thyroid or thyroid disease Rheumatologic History: No reported rheumatological signs and symptoms such as fatigue, joint pain, tenderness, swelling, redness, heat, stiffness, decreased range of motion, with or without associated rash Musculoskeletal History: Negative for myasthenia gravis, muscular dystrophy, multiple sclerosis or malignant hyperthermia Work History: Working full time  Allergies  Ms. Graef has No Known Allergies.  Laboratory Chemistry  Inflammation Markers No results found for: CRP, ESRSEDRATE (CRP: Acute Phase) (ESR: Chronic Phase) Renal Function Markers Lab Results  Component Value Date   BUN 16 05/07/2018   CREATININE 0.81 05/07/2018   GFRAA >60 05/07/2018   GFRNONAA >60 05/07/2018   Hepatic Function Markers Lab Results  Component Value Date   AST 20 05/07/2018   ALT 14 05/07/2018   ALBUMIN 3.7 05/07/2018   ALKPHOS 53 05/07/2018   HCVAB <0.1 01/22/2017   Electrolytes Lab Results  Component Value Date   NA 138 05/07/2018   K 4.5 05/07/2018   CL 107 05/07/2018   CALCIUM 8.4 (L) 05/07/2018   Neuropathy Markers Lab Results  Component Value Date   VITAMINB12 368 12/04/2017   Bone Pathology Markers Lab Results  Component  Value Date   ALKPHOS 53 05/07/2018   VD25OH 45 09/12/2016   CALCIUM 8.4 (L) 05/07/2018   Coagulation Parameters Lab Results  Component Value Date   PLT 173 05/07/2018   Cardiovascular Markers Lab Results  Component Value Date   HGB 13.2 05/07/2018   HCT 38.9 05/07/2018   Note: Lab results reviewed.  PFSH  Drug: Ms. Cerrito  reports that she has current or past drug history. Drug: Marijuana. Alcohol:  reports that she drinks alcohol. Tobacco:  reports that she has been smoking. She started smoking about 31 years ago. She has a 31.00 pack-year smoking history. She has never used smokeless tobacco. Medical:  has a past medical history of Anxiety, Bursitis, Cervical cancer ([place]), Cervical intraepithelial neoplasia I, Chronic lymphocytic leukemia ([place]) (2018), Depression, Eating disorder, History of self-harm, Insomnia, Obsession, Pap smear abnormality of cervix with LGSIL, Tobacco abuse, and Vitamin B12 deficiency (non anemic). Family: family history includes ADD / ADHD in her son; Alcohol abuse in her brother and father; Bipolar disorder in her brother; Cancer in her mother and sister; Depression in her brother and sister.  Past Surgical History:  Procedure Laterality Date  . BREAST BIOPSY Right 01/05/2018   US guided biopsy of 2 areas and 1 lymph node, MIXED INFLAMMATION AND GIANT CELL REACTION  . CERVICAL BIOPSY  W/ LOOP ELECTRODE EXCISION    . OTHER SURGICAL HISTORY     scar tissue removed from vocal cords  . TUBAL LIGATION     Active Ambulatory Problems    Diagnosis Date Noted  . B12 deficiency 07/10/2015  . Insomnia, persistent 07/10/2015  .  Mild episode of recurrent major depressive disorder ([place]) 07/10/2015  . Anorexia nervosa, restricting type 07/10/2015  . Anxiety, generalized 07/10/2015  . H/O suicide attempt 07/10/2015  . Lymphocytosis 07/10/2015  . Obsessive-compulsive disorder 07/10/2015  . Tobacco use 07/10/2015  . History of cervical dysplasia 07/18/2014  .  Stress incontinence 09/04/2016  . CLL (chronic lymphocytic leukemia) ([place]) 11/24/2016  . Pap smear abnormality of cervix/human papillomavirus (HPV) positive 09/12/2016  . GERD without esophagitis 12/11/2016  . Adductor tendinitis 04/23/2017  . Low back pain 07/02/2017  . Trochanteric bursitis of right hip 04/23/2017  . Thoracic aortic atherosclerosis ([place]) 08/20/2017  . Emphysema of lung ([place]) 08/20/2017  . Breast mass, right 01/07/2018  . Mastalgia 05/05/2018  . Facial pain 12/11/2017  . Tingling 12/11/2017  . Chronic pain of both lower extremities (Primary Area of Pain) (R>L) 05/13/2018  . Chronic bilateral low back pain with bilateral sciatica (Secondary Area of Pain) (R>L) 05/13/2018  . Chronic pain syndrome 05/13/2018  . Opiate use 05/13/2018  . Disorder of skeletal system 05/13/2018  . Pharmacologic therapy 05/13/2018  . Problems influencing health status 05/13/2018   Resolved Ambulatory Problems    Diagnosis Date Noted  . Low grade squamous intraepithelial lesion (LGSIL) on cervical Pap smear 07/07/2014  . Cervical dysplasia, mild 07/10/2015  . Facial rash 01/03/2016  . Cellulitis of left upper arm 01/03/2016  . Cellulitis of skin of back 01/03/2016   Past Medical History:  Diagnosis Date  . Anxiety   . Bursitis   . Cervical cancer ([place])   . Cervical intraepithelial neoplasia I   . Chronic lymphocytic leukemia ([place]) 2018  . Depression   . Eating disorder   . History of self-harm   . Insomnia   . Obsession   . Pap smear abnormality of cervix with LGSIL   . Tobacco abuse   . Vitamin B12 deficiency (non anemic)    Constitutional Exam  General appearance: Well nourished, well developed, and well hydrated. In no apparent acute distress Vitals:   05/13/18 1307  BP: 109/71  Pulse: 70  Resp: 16  Temp: 97.8 F (36.6 C)  TempSrc: Oral  SpO2: 100%  Weight: 150 lb (68 kg)  Height: _0  (1.702 m)   BMI Assessment: Estimated body mass index is 23.49 kg/m as  calculated from the following:   Height as of this encounter: _1  (1.702 m).   Weight as of this encounter: 150 lb (68 kg).  BMI interpretation table: BMI level Category Range association with higher incidence of chronic pain  <18 kg/m2 Underweight   18.5-24.9 kg/m2 Ideal body weight   25-29.9 kg/m2 Overweight Increased incidence by 20%  30-34.9 kg/m2 Obese (Class I) Increased incidence by 68%  35-39.9 kg/m2 Severe obesity (Class II) Increased incidence by 136%  >40 kg/m2 Extreme obesity (Class III) Increased incidence by 254%   BMI Readings from Last 4 Encounters:  05/13/18 23.49 kg/m  05/07/18 23.34 kg/m  05/06/18 23.63 kg/m  05/05/18 23.34 kg/m   Wt Readings from Last 4 Encounters:  05/13/18 150 lb (68 kg)  05/07/18 149 lb (67.6 kg)  05/06/18 150 lb 14.4 oz (68.4 kg)  05/05/18 149 lb (67.6 kg)  Psych/Mental status: Alert, oriented x 3 (person, place, & time)       Eyes: PERLA Respiratory: No evidence of acute respiratory distress  Lumbar Spine Exam  Inspection: No masses, redness, or swelling Alignment: Symmetrical Functional ROM: Unrestricted ROM      Stability: No instability detected Muscle strength &  Tone: Functionally intact Sensory: Unimpaired Palpation: Non-tender       Provocative Tests: Lumbar Hyperextension and rotation test: evaluation deferred today       Patrick's Maneuver: evaluation deferred today                    Gait & Posture Assessment  Ambulation: Unassisted Gait: Relatively normal for age and body habitus Posture: WNL   Lower Extremity Exam    Side: Right lower extremity  Side: Left lower extremity  Inspection: No masses, redness, swelling, or asymmetry. No contractures  Inspection: No masses, redness, swelling, or asymmetry. No contractures  Functional ROM: Full ROM          Functional ROM: Full ROM          Muscle strength & Tone: Able to Toe-walk & Heel-walk without problems  Muscle strength & Tone: Able to Toe-walk & Heel-walk  without problems  Sensory: Unimpaired  Sensory: Unimpaired  Palpation: No palpable anomalies 1+ pedal pulses 2+ dorsalis pedis   Palpation: No palpable anomalies 1+ pedal pulses, 2+ dorsal pedis pulse   Assessment  Primary Diagnosis & Pertinent Problem List: Diagnoses of Chronic pain of both lower extremities (Primary Area of Pain) (R>L), Chronic bilateral low back pain with bilateral sciatica (Secondary Area of Pain) (R>L), Chronic pain syndrome, Opiate use, Disorder of skeletal system, Pharmacologic therapy, and Problems influencing health status were pertinent to this visit.  Visit Diagnosis: 1. Chronic pain of both lower extremities (Primary Area of Pain) (R>L)   2. Chronic bilateral low back pain with bilateral sciatica (Secondary Area of Pain) (R>L)   3. Chronic pain syndrome   4. Opiate use   5. Disorder of skeletal system   6. Pharmacologic therapy   7. Problems influencing health status    Plan of Care  Initial treatment plan:  Please be advised that as per protocol, today's visit has been an evaluation only. We have not taken over the patient's controlled substance management.  Problem-specific plan: No problem-specific Assessment & Plan notes found for this encounter.  Ordered Lab-work, Procedure(s), Referral(s), & Consult(s): Orders Placed This Encounter  Procedures  . Compliance Drug Analysis, Ur  . Comp. Metabolic Panel (12)  . Magnesium  . Vitamin B12  . Sedimentation rate  . 25-Hydroxyvitamin D Lcms D2+D3  . C-reactive protein   Pharmacotherapy: Medications ordered:  No orders of the defined types were placed in this encounter.  Medications administered during this visit: Makaia L. Kushner had no medications administered during this visit.   Pharmacotherapy under consideration:  Opioid Analgesics: The patient was informed that there is no guarantee that she would be a candidate for opioid analgesics. The decision will be made following CDC guidelines. This  decision will be based on the results of diagnostic studies, as well as Ms. Fullam's risk profile.  Membrane stabilizer: To be determined at a later time Muscle relaxant: To be determined at a later time NSAID: To be determined at a later time Other analgesic(s): To be determined at a later time   Interventional therapies under consideration: Ms. Hulsebus was informed that there is no guarantee that she would be a candidate for interventional therapies. The decision will be based on the results of diagnostic studies, as well as Ms. Peery's risk profile.  Possible procedure(s): Diagnostic bilateral lumbar epidural steroid injection Diagnostic bilateral lumbar facet nerve block Possible bilateral lumbar facet radiofrequency ablation   Provider-requested follow-up: Return for 2nd Visit, w/ Dr. Dossie Arbour.  Future Appointments  Date Time Provider [place]  05/20/2018 10:00 AM [facility]  06/03/2018  9:30 AM [name], MD [facility] None  08/06/2018 10:00 AM [facility]  08/11/2018  2:30 PM [facility] None  08/11/2018  2:45 PM Finnegan, Kathlene November, MD [place] None    Primary Care Physician: [name], MD Location: [facility] Note by:  Date: 05/13/2018; Time: 4:34 PM  Pain Score Disclaimer: We use the NRS-11 scale. This is a self-reported, subjective measurement of pain severity with only modest accuracy. It is used primarily to identify changes within a particular patient. It must be understood that outpatient pain scales are significantly less accurate that those used for research, where they can be applied under ideal controlled circumstances with minimal exposure to variables. In reality, the score is likely to be a combination of pain intensity and pain affect, where pain affect describes the degree of emotional arousal or changes in action readiness caused by the sensory experience of pain. Factors  such as social and work situation, setting, emotional state, anxiety levels, expectation, and prior pain experience may influence pain perception and show large inter-individual differences that may also be affected by time variables.  Patient instructions provided during this appointment: Patient Instructions   ____________________________________________________________________________________________  Appointment Policy Summary  It is our goal and responsibility to provide the medical community with assistance in the evaluation and management of patients with chronic pain. Unfortunately our resources are limited. Because we do not have an unlimited amount of time, or available appointments, we are required to closely monitor and manage their use. The following rules exist to maximize their use:  Patient's responsibilities: 1. Punctuality:  At what time should I arrive? You should be physically present in our office 30 minutes before your scheduled appointment. Your scheduled appointment is with your assigned healthcare provider. However, it takes 5-10 minutes to be "checked-in", and another 15 minutes for the nurses to do the admission. If you arrive to our office at the time you were given for your appointment, you will end up being at least 20-25 minutes late to your appointment with the provider. 2. Tardiness:  What happens if I arrive only a few minutes after my scheduled appointment time? You will need to reschedule your appointment. The cutoff is your appointment time. This is why it is so important that you arrive at least 30 minutes before that appointment. If you have an appointment scheduled for 10:00 AM and you arrive at 10:01, you will be required to reschedule your appointment.  3. Plan ahead:  Always assume that you will encounter traffic on your way in. Plan for it. If you are dependent on a driver, make sure they understand these rules and the need to arrive early. 4. Other  appointments and responsibilities:  Avoid scheduling any other appointments before or after your pain clinic appointments.  5. Be prepared:  Write down everything that you need to discuss with your healthcare provider and give this information to the admitting nurse. Write down the medications that you will need refilled. Bring your pills and bottles (even the empty ones), to all of your appointments, except for those where a procedure is scheduled. 6. No children or pets:  Find someone to take care of them. It is not appropriate to bring them in. 7. Scheduling changes:  We request "advanced notification" of any changes or cancellations. 8. Advanced notification:  Defined as a time period of more than 24 hours  prior to the originally scheduled appointment. This allows for the appointment to be offered to other patients. 9. Rescheduling:  When a visit is rescheduled, it will require the cancellation of the original appointment. For this reason they both fall within the category of "Cancellations".  10. Cancellations:  They require advanced notification. Any cancellation less than 24 hours before the  appointment will be recorded as a "No Show". 11. No Show:  Defined as an unkept appointment where the patient failed to notify or declare to the practice their intention or inability to keep the appointment.  Corrective process for repeat offenders:  1. Tardiness: Three (3) episodes of rescheduling due to late arrivals will be recorded as one (1) "No Show". 2. Cancellation or reschedule: Three (3) cancellations or rescheduling will be recorded as one (1) "No Show". 3. "No Shows": Three (3) "No Shows" within a 12 month period will result in discharge from the practice. ____________________________________________________________________________________________  ____________________________________________________________________________________________  Pain Scale  Introduction: The pain score  used by this practice is the Verbal Numerical Rating Scale (VNRS-11). This is an 11-point scale. It is for adults and children 10 years or older. There are significant differences in how the pain score is reported, used, and applied. Forget everything you learned in the past and learn this scoring system.  General Information: The scale should reflect your current level of pain. Unless you are specifically asked for the level of your worst pain, or your average pain. If you are asked for one of these two, then it should be understood that it is over the past 24 hours.  Basic Activities of Daily Living (ADL): Personal hygiene, dressing, eating, transferring, and using restroom.  Instructions: Most patients tend to report their level of pain as a combination of two factors, their physical pain and their psychosocial pain. This last one is also known as "suffering" and it is reflection of how physical pain affects you socially and psychologically. From now on, report them separately. From this point on, when asked to report your pain level, report only your physical pain. Use the following table for reference.  Pain Clinic Pain Levels (0-5/10)  Pain Level Score  Description  No Pain 0   Mild pain 1 Nagging, annoying, but does not interfere with basic activities of daily living (ADL). Patients are able to eat, bathe, get dressed, toileting (being able to get on and off the toilet and perform personal hygiene functions), transfer (move in and out of bed or a chair without assistance), and maintain continence (able to control bladder and bowel functions). Blood pressure and heart rate are unaffected. A normal heart rate for a healthy adult ranges from 60 to 100 bpm (beats per minute).   Mild to moderate pain 2 Noticeable and distracting. Impossible to hide from other people. More frequent flare-ups. Still possible to adapt and function close to normal. It can be very annoying and may have occasional stronger  flare-ups. With discipline, patients may get used to it and adapt.   Moderate pain 3 Interferes significantly with activities of daily living (ADL). It becomes difficult to feed, bathe, get dressed, get on and off the toilet or to perform personal hygiene functions. Difficult to get in and out of bed or a chair without assistance. Very distracting. With effort, it can be ignored when deeply involved in activities.   Moderately severe pain 4 Impossible to ignore for more than a few minutes. With effort, patients may still be able to manage work or  participate in some social activities. Very difficult to concentrate. Signs of autonomic nervous system discharge are evident: dilated pupils (mydriasis); mild sweating (diaphoresis); sleep interference. Heart rate becomes elevated (>115 bpm). Diastolic blood pressure (lower number) rises above 100 mmHg. Patients find relief in laying down and not moving.   Severe pain 5 Intense and extremely unpleasant. Associated with frowning face and frequent crying. Pain overwhelms the senses.  Ability to do any activity or maintain social relationships becomes significantly limited. Conversation becomes difficult. Pacing back and forth is common, as getting into a comfortable position is nearly impossible. Pain wakes you up from deep sleep. Physical signs will be obvious: pupillary dilation; increased sweating; goosebumps; brisk reflexes; cold, clammy hands and feet; nausea, vomiting or dry heaves; loss of appetite; significant sleep disturbance with inability to fall asleep or to remain asleep. When persistent, significant weight loss is observed due to the complete loss of appetite and sleep deprivation.  Blood pressure and heart rate becomes significantly elevated. Caution: If elevated blood pressure triggers a pounding headache associated with blurred vision, then the patient should immediately seek attention at an urgent or emergency care unit, as these may be signs of an  impending stroke.    Emergency Department Pain Levels (6-10/10)  Emergency Room Pain 6 Severely limiting. Requires emergency care and should not be seen or managed at an outpatient pain management facility. Communication becomes difficult and requires great effort. Assistance to reach the emergency department may be required. Facial flushing and profuse sweating along with potentially dangerous increases in heart rate and blood pressure will be evident.   Distressing pain 7 Self-care is very difficult. Assistance is required to transport, or use restroom. Assistance to reach the emergency department will be required. Tasks requiring coordination, such as bathing and getting dressed become very difficult.   Disabling pain 8 Self-care is no longer possible. At this level, pain is disabling. The individual is unable to do even the most "basic" activities such as walking, eating, bathing, dressing, transferring to a bed, or toileting. Fine motor skills are lost. It is difficult to think clearly.   Incapacitating pain 9 Pain becomes incapacitating. Thought processing is no longer possible. Difficult to remember your own name. Control of movement and coordination are lost.   The worst pain imaginable 10 At this level, most patients pass out from pain. When this level is reached, collapse of the autonomic nervous system occurs, leading to a sudden drop in blood pressure and heart rate. This in turn results in a temporary and dramatic drop in blood flow to the brain, leading to a loss of consciousness. Fainting is one of the body's self defense mechanisms. Passing out puts the brain in a calmed state and causes it to shut down for a while, in order to begin the healing process.    Summary: 1. Refer to this scale when providing [place] with your pain level. 2. Be accurate and careful when reporting your pain level. This will help with your care. 3. Over-reporting your pain level will lead to loss of  credibility. 4. Even a level of 1/10 means that there is pain and will be treated at our facility. 5. High, inaccurate reporting will be documented as "Symptom Exaggeration", leading to loss of credibility and suspicions of possible secondary gains such as obtaining more narcotics, or wanting to appear disabled, for fraudulent reasons. 6. Only pain levels of 5 or below will be seen at our facility. 7. Pain levels of 6 and above  will be sent to the Emergency Department and the appointment cancelled. ____________________________________________________________________________________________

## 2018-05-18 LAB — 25-HYDROXY VITAMIN D LCMS D2+D3
25-Hydroxy, Vitamin D-2: <1 ng/mL
25-Hydroxy, Vitamin D: 27 ng/mL — ABNORMAL LOW

## 2018-05-18 LAB — COMP. METABOLIC PANEL (12)
AST: 17 IU/L (ref 0–40)
Albumin/Globulin Ratio: 1.9 (ref 1.2–2.2)
Albumin: 4.1 g/dL (ref 3.5–5.5)
Alkaline Phosphatase: 56 IU/L (ref 39–117)
BUN/Creatinine Ratio: 22 (ref 9–23)
BUN: 15 mg/dL (ref 6–24)
Bilirubin Total: 0.4 mg/dL (ref 0.0–1.2)
CALCIUM: 8.3 mg/dL — AB (ref 8.7–10.2)
Chloride: 102 mmol/L (ref 96–106)
Creatinine, Ser: 0.69 mg/dL (ref 0.57–1.00)
GFR, EST AFRICAN AMERICAN: 119 mL/min/{1.73_m2} (ref >59)
GFR, EST NON AFRICAN AMERICAN: 103 mL/min/{1.73_m2} (ref >59)
GLOBULIN, TOTAL: 2.2 g/dL (ref 1.5–4.5)
GLUCOSE: 72 mg/dL (ref 65–99)
POTASSIUM: 4.2 mmol/L (ref 3.5–5.2)
Sodium: 138 mmol/L (ref 134–144)
Total Protein: 6.3 g/dL (ref 6.0–8.5)

## 2018-05-18 LAB — SEDIMENTATION RATE: SED RATE: 4 mm/h (ref 0–32)

## 2018-05-18 LAB — MAGNESIUM: Magnesium: 1.8 mg/dL (ref 1.6–2.3)

## 2018-05-18 LAB — C-REACTIVE PROTEIN

## 2018-05-18 LAB — 25-HYDROXYVITAMIN D LCMS D2+D3: 25-HYDROXY, VITAMIN D-3: 27 ng/mL

## 2018-05-18 LAB — VITAMIN B12: Vitamin B-12: 303 pg/mL (ref 232–1245)

## 2018-05-19 ENCOUNTER — Encounter: Payer: Self-pay | Admitting: Nurse Practitioner

## 2018-05-19 DIAGNOSIS — F1290 Cannabis use, unspecified, uncomplicated: Secondary | ICD-10-CM | POA: Insufficient documentation

## 2018-05-19 LAB — COMPLIANCE DRUG ANALYSIS, UR

## 2018-05-20 ENCOUNTER — Ambulatory Visit (INDEPENDENT_AMBULATORY_CARE_PROVIDER_SITE_OTHER): Payer: BLUE CROSS/BLUE SHIELD

## 2018-05-20 DIAGNOSIS — R3989 Other symptoms and signs involving the genitourinary system: Secondary | ICD-10-CM

## 2018-05-20 DIAGNOSIS — R319 Hematuria, unspecified: Secondary | ICD-10-CM

## 2018-05-20 LAB — POCT URINALYSIS DIPSTICK
Bilirubin, UA: NEGATIVE
Blood, UA: NEGATIVE
Glucose, UA: NEGATIVE
Ketones, UA: NEGATIVE
Nitrite, UA: NEGATIVE
Odor: NORMAL
Protein, UA: NEGATIVE
Spec Grav, UA: 1.015 (ref 1.010–1.025)
Urobilinogen, UA: 0.2 E.U./dL
pH, UA: 5.5 (ref 5.0–8.0)

## 2018-05-21 LAB — URINE CULTURE
MICRO NUMBER:: 91055913
SPECIMEN QUALITY: ADEQUATE

## 2018-05-30 ENCOUNTER — Other Ambulatory Visit: Payer: Self-pay | Admitting: Family Medicine

## 2018-05-30 DIAGNOSIS — J439 Emphysema, unspecified: Secondary | ICD-10-CM

## 2018-05-30 DIAGNOSIS — Z720 Tobacco use: Secondary | ICD-10-CM

## 2018-06-02 DIAGNOSIS — M5136 Other intervertebral disc degeneration, lumbar region: Secondary | ICD-10-CM | POA: Insufficient documentation

## 2018-06-02 DIAGNOSIS — M47816 Spondylosis without myelopathy or radiculopathy, lumbar region: Secondary | ICD-10-CM | POA: Insufficient documentation

## 2018-06-02 NOTE — Progress Notes (Signed)
Patient's Name: Haden Cavenaugh  MRN: [id number]  Referring Provider: [name], MD  DOB: [birth year]  PCP: [name], MD  DOS: 06/03/2018  Note by: Gaspar Cola, MD  Service setting: Ambulatory outpatient  Specialty: Interventional Pain Management  Location: [facility] (AMB) Pain Management Facility    Patient type: Established   Primary Reason(s) for Visit: Encounter for evaluation before starting new chronic pain management plan of care (Level of risk: moderate) CC: Leg Pain  HPI  Ms. Coyt is a 47 y.o. year old, female patient, who comes today for a follow-up evaluation to review the test results and decide on a treatment plan. She has B12 deficiency; Insomnia, persistent; Mild episode of recurrent major depressive disorder ([place]); Anorexia nervosa, restricting type; Anxiety, generalized; H/O suicide attempt; Lymphocytosis; Obsessive-compulsive disorder; Tobacco use; History of cervical dysplasia; Stress incontinence; CLL (chronic lymphocytic leukemia) ([place]); Pap smear abnormality of cervix/human papillomavirus (HPV) positive; GERD without esophagitis; Adductor tendinitis; Trochanteric bursitis of right hip; Thoracic aortic atherosclerosis ([place]); Emphysema of lung ([place]); Breast mass, right; Mastalgia; Facial pain; Tingling; Chronic lower extremity pain (Primary Area of Pain) (Bilateral) (R>L); Chronic low back pain (Secondary Area of Pain) (Bilateral) (R>L) w/ sciatica (Bilateral); Chronic pain syndrome; Opiate use; Disorder of skeletal system; Pharmacologic therapy; Problems influencing health status; Marijuana use; DDD (degenerative disc disease), lumbar; Lumbar facet hypertrophy; Lumbar facet arthropathy; Lumbar facet syndrome (Bilateral) (R>L); Hypocalcemia; Vitamin D insufficiency; Abnormal MRI, lumbar spine (04/20/2017); Chronic hip pain (Right); and Spondylosis without myelopathy or radiculopathy, lumbar region on their problem list. Her primarily concern today is the Leg Pain  Pain  Assessment: Location: Right, Left Leg Radiating: Both leg get dumbing feeling, tingling and burn. Denies radiaties Onset: More than a month ago Duration: Chronic pain Quality: Aching, Burning, Tingling Severity: 1 /10 (subjective, self-reported pain score)  Note: Reported level is compatible with observation.                         When using our objective Pain Scale, levels between 6 and 10/10 are said to belong in an emergency room, as it progressively worsens from a 6/10, described as severely limiting, requiring emergency care not usually available at an outpatient pain management facility. At a 6/10 level, communication becomes difficult and requires great effort. Assistance to reach the emergency department may be required. Facial flushing and profuse sweating along with potentially dangerous increases in heart rate and blood pressure will be evident. Effect on ADL: have to push myself to keep on moving at work Timing: Intermittent Modifying factors: gabapentin and rest BP: (!) 102/33  HR: (!) 102  Ms. Grange comes in today for a follow-up visit after her initial evaluation on 05/13/2018. Today we went over the results of her tests. These were explained in "Layman's terms". During today's appointment we went over my diagnostic impression, as well as the proposed treatment plan.  According to the patient her primary area of pain is in her legs (B) (R>L).  RLEP - Down to ankles rotational pain at the front on the thigh and in the back at the calf. Does have pain in the ball of her feet (bottom) after prolonged standing. (Burning, tingling, sharp, pulling sensation.) LLEP - Same as the other leg. She admits that the pain varies and involves both legs in every area it just pains.  She does have tingling and tightness.  He admits that this pain started approximately 1 year ago after being treated with chemotherapy for  chronic lymphocytic leukemia (CLL).  She admits that she has been seen by  orthopedist and was given 2 injections which were effective (right hip trochanteric bursa injection by Dr. Montel Clock from [facility]) (02/20/2018). Takes 3-5 days to work, but has not gone back to how it was.  She denies any formal physical therapy.  She denies a previous nerve conduction study. She admits that she is currently on gabapentin 100 mg, written for BID, however she only takes it as needed.  Her second area pain would be considered her lower back (B) (R>L).  She does not have a low back pain mostly leg pain.  She admits that the pain is worse when she works.  She is a Freight forwarder at [employer].  She denies any previous surgery.  Interventional therapy that was stated above unknown.  She denies physical therapy but states she was told to wear a brace which she did for over a year while working.  She admits that she has had a recent MRI. Back pain occasionally will go up to the mid-upper back.  CLL is followed by Dr. Maryjane Hurter [facility])  In considering the treatment plan options, Ms. Winton was reminded that I no longer take patients for medication management only. I asked her to let me know if she had no intention of taking advantage of the interventional therapies, so that we could make arrangements to provide this space to someone interested. I also made it clear that undergoing interventional therapies for the purpose of getting pain medications is very inappropriate on the part of a patient, and it will not be tolerated in this practice. This type of behavior would suggest true addiction and therefore it requires referral to an addiction specialist.   Further details on both, my assessment(s), as well as the proposed treatment plan, please see below.  Controlled Substance Pharmacotherapy Assessment REMS (Risk Evaluation and Mitigation Strategy)  Analgesic: Oxycodone 5 mg 1 tablet p.o. every 6 hours (last filled on 03/31/2018) #20. Highest recorded MME/day: 30 mg/day MME/day: 30  mg/day Pill Count: None expected due to no prior prescriptions written by our practice. No notes on file Pharmacokinetics: Liberation and absorption (onset of action): WNL Distribution (time to peak effect): WNL Metabolism and excretion (duration of action): WNL         Pharmacodynamics: Desired effects: Analgesia: Ms. Hage reports >50% benefit. Functional ability: Patient reports that medication allows her to accomplish basic ADLs Clinically meaningful improvement in function (CMIF): Sustained CMIF goals met Perceived effectiveness: Described as relatively effective, allowing for increase in activities of daily living (ADL) Undesirable effects: Side-effects or Adverse reactions: None reported Monitoring: [place] PMP: Online review of the past 70-monthperiod previously conducted. Not applicable at this point since we have not taken over the patient's medication management yet. List of other Serum/Urine Drug Screening Test(s):  No results found. List of all UDS test(s) done:  Lab Results  Component Value Date   SUMMARY FINAL 05/13/2018   Last UDS on record: Summary  Date Value Ref Range Status  05/13/2018 FINAL  Final    Comment:    ==================================================================== TOXASSURE COMP DRUG ANALYSIS,UR ==================================================================== Test                             Result       Flag       Units Drug Present and Declared for Prescription Verification   Gabapentin  PRESENT      EXPECTED Drug Present not Declared for Prescription Verification   Carboxy-THC                    213          UNEXPECTED ng/mg creat    Carboxy-THC is a metabolite of tetrahydrocannabinol  (THC).    Source of [facility] is most commonly illicit, but THC is also present    in a scheduled prescription medication.   Naproxen                       PRESENT      UNEXPECTED Drug Absent but Declared for Prescription Verification    Hydrocodone                    Not Detected UNEXPECTED ng/mg creat   Tramadol                       Not Detected UNEXPECTED ng/mg creat   Citalopram                     Not Detected UNEXPECTED   Prochlorperazine               Not Detected UNEXPECTED   Acetaminophen                  Not Detected UNEXPECTED    Acetaminophen, as indicated in the declared medication list, is    not always detected even when used as directed. ==================================================================== Test                      Result    Flag   Units      Ref Range   Creatinine              218              mg/dL      >=20 ==================================================================== Declared Medications:  The flagging and interpretation on this report are based on the  following declared medications.  Unexpected results may arise from  inaccuracies in the declared medications.  **Note: The testing scope of this panel includes these medications:  Escitalopram (Lexapro)  Gabapentin (Neurontin)  Hydrocodone (Norco)  Prochlorperazine (Compazine)  Tramadol (Ultram)  **Note: The testing scope of this panel does not include small to  moderate amounts of these reported medications:  Acetaminophen (Norco)  **Note: The testing scope of this panel does not include following  reported medications:  Umeclidinium (Anoro Ellipta)  Varenicline (Chantix)  Vilanterol (Anoro Ellipta) ==================================================================== For clinical consultation, please call [phone]. ====================================================================    UDS interpretation: Unexpected findings: Undeclared illicit substance detected Medication Assessment Form: Not applicable. No opioids. Treatment compliance: Not applicable Risk Assessment Profile: Aberrant behavior: use of illicit substances Comorbid factors increasing risk of overdose: age 51-74 years old, COPD or asthma, history  of attempted suicide  and nicotine dependence Opioid risk tool (ORT) (Total Score): 14 Personal History of Substance Abuse (SUD-Substance use disorder):  Alcohol: Alcohol: Negative  Illegal Drugs: Illegal Drugs: Positive Female or Female  Rx Drugs: Rx Drugs: Negative  ORT Risk Level calculation: Opioid Risk Interpretation: High Risk Risk of substance use disorder (SUD): High-to-Very High Opioid Risk Tool - 06/03/18 0956      Family History of Substance Abuse   Alcohol  Positive Female    Illegal Drugs  Positive Female  Rx Drugs  Positive Female or Female      Personal History of Substance Abuse   Alcohol  Negative    Illegal Drugs  Positive Female or Female    Rx Drugs  Negative      Age   Age between 39-45 years   No      History of Preadolescent Sexual Abuse   History of Preadolescent Sexual Abuse  Negative or Female      Psychological Disease   Psychological Disease  Negative    Depression  Negative      Total Score   Opioid Risk Tool Scoring  14    Opioid Risk Interpretation  High Risk      ORT Scoring interpretation table:  Score <3 = Low Risk for SUD  Score between 4-7 = Moderate Risk for SUD  Score >8 = High Risk for Opioid Abuse   Risk Mitigation Strategies:  Patient opioid safety counseling: No controlled substances prescribed. Patient-Prescriber Agreement (PPA): No agreement signed.  Controlled substance notification to other providers: None required. No opioid therapy.  Pharmacologic Plan: No opioid analgesic prescribed.             Laboratory Chemistry  Inflammation Markers (CRP: Acute Phase) (ESR: Chronic Phase) Lab Results  Component Value Date   CRP <1 05/13/2018   ESRSEDRATE 4 05/13/2018                         Renal Function Markers Lab Results  Component Value Date   BUN 15 05/13/2018   CREATININE 0.69 05/13/2018   BCR 22 05/13/2018   GFRAA 119 05/13/2018   GFRNONAA 103 05/13/2018                             Hepatic Function Markers Lab  Results  Component Value Date   AST 17 05/13/2018   ALT 14 05/07/2018   ALBUMIN 4.1 05/13/2018   ALKPHOS 56 05/13/2018   HCVAB <0.1 01/22/2017                        Electrolytes Lab Results  Component Value Date   NA 138 05/13/2018   K 4.2 05/13/2018   CL 102 05/13/2018   CALCIUM 8.3 (L) 05/13/2018   MG 1.8 05/13/2018                        Neuropathy Markers Lab Results  Component Value Date   VITAMINB12 303 05/13/2018   HGBA1C 5.3 09/12/2016                        Bone Pathology Markers Lab Results  Component Value Date   VD25OH 45 09/12/2016   25OHVITD1 27 (L) 05/13/2018   25OHVITD2 <1.0 05/13/2018   25OHVITD3 27 05/13/2018                         Coagulation Parameters Lab Results  Component Value Date   PLT 173 05/07/2018   DDIMER 0.34 02/20/2017                        Cardiovascular Markers Lab Results  Component Value Date   CKTOTAL 44 12/04/2017   CKMB <0.7 12/04/2017   TROPONINI <0.01 12/04/2017   HGB 13.2 05/07/2018   HCT 38.9 05/07/2018  Note: Lab results reviewed.  Recent Diagnostic Imaging Review  Lumbosacral Imaging: Lumbar MR w/wo contrast:  Results for orders placed during the hospital encounter of 04/20/17  MR Lumbar Spine W Wo Contrast   Narrative CLINICAL DATA:  47 year old female diagnosed with chronic lymphocytic leukemia in February this year. Severe pain radiating to the right lower extremity.  EXAM: MRI LUMBAR SPINE WITHOUT AND WITH CONTRAST  TECHNIQUE: Multiplanar and multiecho pulse sequences of the lumbar spine were obtained without and with intravenous contrast.  CONTRAST:  3m MULTIHANCE GADOBENATE DIMEGLUMINE 529 MG/ML IV SOLN  COMPARISON:  CT chest abdomen and pelvis 01/21/2017.  FINDINGS: Segmentation: Normal aside from partially sacralized L5 level (right side assimilation joint) as demonstrated on the comparison CT. Vestigial L5-S1 disc space. Full size ribs at T12.  Alignment:   Stable an normal lumbar vertebral height and alignment.  Vertebrae: Diffusely abnormal decreased T1 bone marrow signal throughout the visible spine and pelvis (series 3, image 10 and series 6, image 40). No foci of abnormal increased STIR signal to suggest marrow edema. Following contrast there is uniform low level marrow enhancement, no discrete or destructive marrow lesion.  Conus medullaris: Extends to the T12-L1 level and appears normal. No abnormal intradural enhancement. Normal cauda equina nerve roots.  Paraspinal and other soft tissues: Bulky left greater than right retroperitoneal lymphadenopathy measures up to 3.5 cm short axis, and does not appear significantly changed since 01/21/2017 (series 7, image 28 today versus series 2, image 87 of the CT comparison). Partially visible bilateral iliac chains lymphadenopathy.  The low visible liver and kidneys appear within normal limits. Visible bowel loops are within normal limits. Negative visualized posterior paraspinal soft tissues.  Disc levels:  From T11-T12 to L3-L4 there is no significant lumbar disc degeneration.  There is a subtle right paracentral disc protrusion at L4-L5. The L5-S1 disc is normal.  Mild lower lumbar facet and ligament flavum hypertrophy at L3-L4 and L4-L5.  No lumbar spinal stenosis or convincing neural impingement.  IMPRESSION: 1. Bulky retroperitoneal lymphadenopathy, greater on the left, not significantly changed since 01/21/2017. 2. Diffusely abnormal bone marrow signal, but as there is no discrete or destructive osseous lesion I favor this is treatment related. Query anemia. 3. Otherwise no metastatic disease in the lumbar spine. 4. Minimal lumbar spine degeneration, with no spinal stenosis or convincing neural impingement.   Electronically Signed   By: HGenevie AnnM.D.   On: 04/20/2017 17:10    Complexity Note: Imaging results reviewed. Results shared with Ms. Aaron, using Layman's  terms.                         Meds   Current Outpatient Medications:  .  ANORO ELLIPTA 62.5-25 MCG/INH AEPB, INHALE 1 PUFF INTO THE LUNGS EVERY DAY, Disp: 60 each, Rfl: 0 .  escitalopram (LEXAPRO) 10 MG tablet, as needed. , Disp: , Rfl:  .  gabapentin (NEURONTIN) 100 MG capsule, Take 100 mg twice a day for one week, then increase to 200 mg(2 tablets) twice a day and continue, Disp: , Rfl:  .  HYDROcodone-acetaminophen (NORCO/VICODIN) 5-325 MG tablet, every 6 (six) hours as needed. , Disp: , Rfl:  .  prochlorperazine (COMPAZINE) 10 MG tablet, Take 1 tablet (10 mg total) by mouth every 6 (six) hours as needed (Nausea or vomiting)., Disp: 60 tablet, Rfl: 2 .  traMADol (ULTRAM) 50 MG tablet, Take by mouth every 6 (six) hours as needed., Disp: , Rfl:  .  varenicline (CHANTIX STARTING MONTH PAK) 0.5 MG X 11 & 1 MG X 42 tablet, Take one 0.5 mg tablet by mouth once daily for 3 days, then increase to one 0.5 mg tablet twice daily for 4 days, then increase to one 1 mg tablet twice daily., Disp: 53 tablet, Rfl: 0 .  Calcium Carbonate-Vit D-Min (GNP CALCIUM 1200) 1200-1000 MG-UNIT CHEW, Chew 1,200 mg by mouth daily with breakfast. Take in combination with vitamin D and magnesium., Disp: 30 tablet, Rfl: 5 .  Cholecalciferol (VITAMIN D3) 5000 units CAPS, Take 1 capsule (5,000 Units total) by mouth daily with breakfast. Take along with calcium and magnesium., Disp: 30 capsule, Rfl: 5 .  ergocalciferol (VITAMIN D2) 50000 units capsule, Take 1 capsule (50,000 Units total) by mouth 2 (two) times a week. X 6 weeks., Disp: 12 capsule, Rfl: 0 .  Magnesium 500 MG CAPS, Take 1 capsule (500 mg total) by mouth 2 (two) times daily at 8 am and 10 pm., Disp: 60 capsule, Rfl: 5 .  meloxicam (MOBIC) 15 MG tablet, Take 1 tablet (15 mg total) by mouth daily., Disp: 30 tablet, Rfl: 0 .  tiZANidine (ZANAFLEX) 2 MG tablet, Take 1 tablet (2 mg total) by mouth every 8 (eight) hours as needed for muscle spasms., Disp: 90 tablet, Rfl:  0  ROS  Constitutional: Denies any fever or chills Gastrointestinal: No reported hemesis, hematochezia, vomiting, or acute GI distress Musculoskeletal: Denies any acute onset joint swelling, redness, loss of ROM, or weakness Neurological: No reported episodes of acute onset apraxia, aphasia, dysarthria, agnosia, amnesia, paralysis, loss of coordination, or loss of consciousness  Allergies  Ms. Canizares has No Known Allergies.  PFSH  Drug: Ms. Revard  reports that she has current or past drug history. Drug: Marijuana. Alcohol:  reports that she drinks alcohol. Tobacco:  reports that she has been smoking. She started smoking about 31 years ago. She has a 31.00 pack-year smoking history. She has never used smokeless tobacco. Medical:  has a past medical history of Anxiety, Bursitis, Cervical cancer ([place]), Cervical intraepithelial neoplasia I, Chronic lymphocytic leukemia ([place]) (2018), Depression, Eating disorder, History of self-harm, Insomnia, Obsession, Pap smear abnormality of cervix with LGSIL, Tobacco abuse, and Vitamin B12 deficiency (non anemic). Surgical: Ms. Torrens  has a past surgical history that includes Cervical biopsy w/ loop electrode excision; Tubal ligation; Breast biopsy (Right, 01/05/2018); and OTHER SURGICAL HISTORY. Family: family history includes ADD / ADHD in her son; Alcohol abuse in her brother and father; Bipolar disorder in her brother; Cancer in her mother and sister; Depression in her brother and sister.  Constitutional Exam  General appearance: Well nourished, well developed, and well hydrated. In no apparent acute distress Vitals:   06/03/18 0942  BP: (!) 102/33  Pulse: (!) 102  Temp: 98 F (36.7 C)  SpO2: 98%  Weight: 150 lb (68 kg)  Height: 5' 7"  (1.702 m)   BMI Assessment: Estimated body mass index is 23.49 kg/m as calculated from the following:   Height as of this encounter: 5' 7"  (1.702 m).   Weight as of this encounter: 150 lb (68 kg).  BMI  interpretation table: BMI level Category Range association with higher incidence of chronic pain  <18 kg/m2 Underweight   18.5-24.9 kg/m2 Ideal body weight   25-29.9 kg/m2 Overweight Increased incidence by 20%  30-34.9 kg/m2 Obese (Class I) Increased incidence by 68%  35-39.9 kg/m2 Severe obesity (Class II) Increased incidence by 136%  >40 kg/m2 Extreme obesity (Class III) Increased incidence  by 254%   Patient's current BMI Ideal Body weight  Body mass index is 23.49 kg/m. Ideal body weight: 61.6 kg (135 lb 12.9 oz) Adjusted ideal body weight: 64.2 kg (141 lb 7.7 oz)   BMI Readings from Last 4 Encounters:  06/03/18 23.49 kg/m  05/13/18 23.49 kg/m  05/07/18 23.34 kg/m  05/06/18 23.63 kg/m   Wt Readings from Last 4 Encounters:  06/03/18 150 lb (68 kg)  05/13/18 150 lb (68 kg)  05/07/18 149 lb (67.6 kg)  05/06/18 150 lb 14.4 oz (68.4 kg)  Psych/Mental status: Alert, oriented x 3 (person, place, & time)       Eyes: PERLA Respiratory: No evidence of acute respiratory distress  Cervical Spine Area Exam  Skin & Axial Inspection: No masses, redness, edema, swelling, or associated skin lesions Alignment: Symmetrical Functional ROM: Unrestricted ROM      Stability: No instability detected Muscle Tone/Strength: Functionally intact. No obvious neuro-muscular anomalies detected. Sensory (Neurological): Unimpaired Palpation: No palpable anomalies              Upper Extremity (UE) Exam    Side: Right upper extremity  Side: Left upper extremity  Skin & Extremity Inspection: Skin color, temperature, and hair growth are WNL. No peripheral edema or cyanosis. No masses, redness, swelling, asymmetry, or associated skin lesions. No contractures.  Skin & Extremity Inspection: Skin color, temperature, and hair growth are WNL. No peripheral edema or cyanosis. No masses, redness, swelling, asymmetry, or associated skin lesions. No contractures.  Functional ROM: Unrestricted ROM          Functional  ROM: Unrestricted ROM          Muscle Tone/Strength: Functionally intact. No obvious neuro-muscular anomalies detected.  Muscle Tone/Strength: Functionally intact. No obvious neuro-muscular anomalies detected.  Sensory (Neurological): Unimpaired          Sensory (Neurological): Unimpaired          Palpation: No palpable anomalies              Palpation: No palpable anomalies              Provocative Test(s):  Phalen's test: deferred Tinel's test: deferred Apley's scratch test (touch opposite shoulder):  Action 1 (Across chest): deferred Action 2 (Overhead): deferred Action 3 (LB reach): deferred   Provocative Test(s):  Phalen's test: deferred Tinel's test: deferred Apley's scratch test (touch opposite shoulder):  Action 1 (Across chest): deferred Action 2 (Overhead): deferred Action 3 (LB reach): deferred    Thoracic Spine Area Exam  Skin & Axial Inspection: No masses, redness, or swelling Alignment: Symmetrical Functional ROM: Unrestricted ROM Stability: No instability detected Muscle Tone/Strength: Functionally intact. No obvious neuro-muscular anomalies detected. Sensory (Neurological): Unimpaired Muscle strength & Tone: No palpable anomalies  Lumbar Spine Area Exam  Skin & Axial Inspection: No masses, redness, or swelling Alignment: Symmetrical Functional ROM: Unrestricted ROM       Stability: No instability detected Muscle Tone/Strength: Functionally intact. No obvious neuro-muscular anomalies detected. Sensory (Neurological): Unimpaired Palpation: No palpable anomalies       Provocative Tests: Hyperextension/rotation test: deferred today       Lumbar quadrant test (Kemp's test): deferred today       Lateral bending test: deferred today       Patrick's Maneuver: deferred today                   FABER test: deferred today  S-I anterior distraction/compression test: deferred today         S-I lateral compression test: deferred today         S-I  Thigh-thrust test: deferred today         S-I Gaenslen's test: deferred today          Gait & Posture Assessment  Ambulation: Unassisted Gait: Relatively normal for age and body habitus Posture: WNL   Lower Extremity Exam    Side: Right lower extremity  Side: Left lower extremity  Stability: No instability observed          Stability: No instability observed          Skin & Extremity Inspection: Skin color, temperature, and hair growth are WNL. No peripheral edema or cyanosis. No masses, redness, swelling, asymmetry, or associated skin lesions. No contractures.  Skin & Extremity Inspection: Skin color, temperature, and hair growth are WNL. No peripheral edema or cyanosis. No masses, redness, swelling, asymmetry, or associated skin lesions. No contractures.  Functional ROM: Unrestricted ROM                  Functional ROM: Unrestricted ROM                  Muscle Tone/Strength: Able to Toe-walk & Heel-walk without problems  Muscle Tone/Strength: Able to Toe-walk & Heel-walk without problems  Sensory (Neurological): Unimpaired  Sensory (Neurological): Unimpaired  Palpation: No palpable anomalies  Palpation: No palpable anomalies   Assessment & Plan  Primary Diagnosis & Pertinent Problem List: The primary encounter diagnosis was Chronic pain syndrome. Diagnoses of Chronic lower extremity pain (Primary Area of Pain) (Bilateral) (R>L), Chronic low back pain (Secondary Area of Pain) (Bilateral) (R>L) w/ sciatica (Bilateral), CLL (chronic lymphocytic leukemia) (HCC), DDD (degenerative disc disease), lumbar, Lumbar facet arthropathy, Lumbar facet hypertrophy, Lumbar facet syndrome (Bilateral) (R>L), Disorder of skeletal system, Problems influencing health status, Pharmacologic therapy, Opiate use, Marijuana use, Tobacco use, Hypocalcemia, Vitamin D insufficiency, Abnormal MRI, lumbar spine (04/20/2017), Chronic hip pain (Right), and Spondylosis without myelopathy or radiculopathy, lumbar region were also  pertinent to this visit.  Visit Diagnosis: 1. Chronic pain syndrome   2. Chronic lower extremity pain (Primary Area of Pain) (Bilateral) (R>L)   3. Chronic low back pain (Secondary Area of Pain) (Bilateral) (R>L) w/ sciatica (Bilateral)   4. CLL (chronic lymphocytic leukemia) ([place])   5. DDD (degenerative disc disease), lumbar   6. Lumbar facet arthropathy   7. Lumbar facet hypertrophy   8. Lumbar facet syndrome (Bilateral) (R>L)   9. Disorder of skeletal system   10. Problems influencing health status   11. Pharmacologic therapy   12. Opiate use   13. Marijuana use   14. Tobacco use   15. Hypocalcemia   16. Vitamin D insufficiency   17. Abnormal MRI, lumbar spine (04/20/2017)   18. Chronic hip pain (Right)   19. Spondylosis without myelopathy or radiculopathy, lumbar region    Problems updated and reviewed during this visit: No problems updated. Time Note: Greater than 50% of the 40 minute(s) of face-to-face time spent with Ms. Franze, was spent in counseling/coordination of care regarding: Ms. Manganello primary cause of pain, the results of her recent test(s), the significance of each one oth the test(s) anomalies and it's corresponding characteristic pain pattern(s), the treatment plan, treatment alternatives, the risks and possible complications of proposed treatment, medication side effects, the opioid analgesic risks and possible complications, realistic expectations, the goals of pain management (increased in  functionality), the patient's responsibilities when it comes to controlled substances and the need to collect and read the AVS material. Plan of Care  Pharmacotherapy (Medications Ordered): Meds ordered this encounter  Medications  . ergocalciferol (VITAMIN D2) 50000 units capsule    Sig: Take 1 capsule (50,000 Units total) by mouth 2 (two) times a week. X 6 weeks.    Dispense:  12 capsule    Refill:  0    Do not add this medication to the electronic "Automatic Refill"  notification system. Patient may have prescription filled one day early if pharmacy is closed on scheduled refill date.  . Cholecalciferol (VITAMIN D3) 5000 units CAPS    Sig: Take 1 capsule (5,000 Units total) by mouth daily with breakfast. Take along with calcium and magnesium.    Dispense:  30 capsule    Refill:  5    Do not place medication on "Automatic Refill".  May substitute with similar over-the-counter product.  . Magnesium 500 MG CAPS    Sig: Take 1 capsule (500 mg total) by mouth 2 (two) times daily at 8 am and 10 pm.    Dispense:  60 capsule    Refill:  5    Do not place medication on "Automatic Refill".  The patient may use similar over-the-counter product.  . Calcium Carbonate-Vit D-Min (GNP CALCIUM 1200) 1200-1000 MG-UNIT CHEW    Sig: Chew 1,200 mg by mouth daily with breakfast. Take in combination with vitamin D and magnesium.    Dispense:  30 tablet    Refill:  5    Do not place medication on "Automatic Refill".  May substitute with similar over-the-counter product.  . orphenadrine (NORFLEX) injection 60 mg  . ketorolac (TORADOL) injection 60 mg  . meloxicam (MOBIC) 15 MG tablet    Sig: Take 1 tablet (15 mg total) by mouth daily.    Dispense:  30 tablet    Refill:  0    Do not add this medication to the electronic "Automatic Refill" notification system. Patient may have prescription filled one day early if pharmacy is closed on scheduled refill date.  Marland Kitchen tiZANidine (ZANAFLEX) 2 MG tablet    Sig: Take 1 tablet (2 mg total) by mouth every 8 (eight) hours as needed for muscle spasms.    Dispense:  90 tablet    Refill:  0    Do not place this medication, or any other prescription from our practice, on "Automatic Refill". Patient may have prescription filled one day early if pharmacy is closed on scheduled refill date.    Procedure Orders     Lumbar Epidural Injection     Lumbar Transforaminal Epidural Lab Orders  No laboratory test(s) ordered today    Imaging  Orders     DG HIP UNILAT W OR W/O PELVIS 2-3 VIEWS RIGHT Referral Orders  No referral(s) requested today   Pharmacological management options:  Opioid Analgesics: I will not be prescribing any opioids at this time Membrane stabilizer: Neurontin 100 mg p.o. 2 tabs at bedtime Muscle relaxant: Tizanidine 2 mg 1 tolerate p.o. every 8 hours as needed for muscle related back pain NSAID: Meloxicam 15 mg p.o. daily for osteoarthritis of lumbar facets and hip pain Other analgesic(s): To be determined at a later time   Interventional management options: Planned, scheduled, and/or pending:    Diagnostic right-sided L4 transforaminal ESI #1+ diagnostic right-sided L3-4 LESI #1 under fluoroscopic guidance and IV sedation   Considering:   Diagnostic bilateral lumbar epidural steroid  injection  Diagnostic bilateral lumbar facet nerve block  Possible bilateral lumbar facet radiofrequency ablation    PRN Procedures:   None at this time   Provider-requested follow-up: Return for Procedure (w/ sedation): (R) L4-5 LESI #1 + (R) L4 TFESI #1.  Future Appointments  Date Time Provider [place]  06/11/2018  8:15 AM [name], MD [facility] None  08/06/2018 10:00 AM [facility]  08/11/2018  2:30 PM [facility] None  08/11/2018  2:45 PM Finnegan, Kathlene November, MD [place] None    Primary Care Physician: [name], MD Location: [facility] Note by: Gaspar Cola, MD Date: 06/03/2018; Time: 11:17 AM

## 2018-06-03 ENCOUNTER — Ambulatory Visit
Admission: RE | Admit: 2018-06-03 | Discharge: 2018-06-03 | Disposition: A | Discharge status: Home / Self Care | Payer: BLUE CROSS/BLUE SHIELD | Source: Ambulatory Visit | Attending: Pain Medicine | Admitting: Pain Medicine

## 2018-06-03 ENCOUNTER — Ambulatory Visit (HOSPITAL_BASED_OUTPATIENT_CLINIC_OR_DEPARTMENT_OTHER): Payer: BLUE CROSS/BLUE SHIELD | Admitting: Pain Medicine

## 2018-06-03 ENCOUNTER — Other Ambulatory Visit: Payer: Self-pay | Admitting: Pain Medicine

## 2018-06-03 ENCOUNTER — Encounter: Payer: Self-pay | Admitting: Pain Medicine

## 2018-06-03 ENCOUNTER — Other Ambulatory Visit: Payer: Self-pay

## 2018-06-03 VITALS — BP 102/33 | HR 102 | Temp 98.0°F | Ht 67.0 in | Wt 150.0 lb

## 2018-06-03 DIAGNOSIS — M5441 Lumbago with sciatica, right side: Secondary | ICD-10-CM | POA: Insufficient documentation

## 2018-06-03 DIAGNOSIS — Z79891 Long term (current) use of opiate analgesic: Secondary | ICD-10-CM | POA: Diagnosis not present

## 2018-06-03 DIAGNOSIS — K219 Gastro-esophageal reflux disease without esophagitis: Secondary | ICD-10-CM | POA: Insufficient documentation

## 2018-06-03 DIAGNOSIS — Z789 Other specified health status: Secondary | ICD-10-CM

## 2018-06-03 DIAGNOSIS — M51369 Other intervertebral disc degeneration, lumbar region without mention of lumbar back pain or lower extremity pain: Secondary | ICD-10-CM

## 2018-06-03 DIAGNOSIS — G4700 Insomnia, unspecified: Secondary | ICD-10-CM | POA: Diagnosis not present

## 2018-06-03 DIAGNOSIS — F411 Generalized anxiety disorder: Secondary | ICD-10-CM | POA: Insufficient documentation

## 2018-06-03 DIAGNOSIS — F5000 Anorexia nervosa, unspecified: Secondary | ICD-10-CM | POA: Diagnosis not present

## 2018-06-03 DIAGNOSIS — C9110 Chronic lymphocytic leukemia of B-cell type not having achieved remission: Secondary | ICD-10-CM

## 2018-06-03 DIAGNOSIS — M79604 Pain in right leg: Secondary | ICD-10-CM | POA: Diagnosis not present

## 2018-06-03 DIAGNOSIS — M25551 Pain in right hip: Secondary | ICD-10-CM | POA: Diagnosis not present

## 2018-06-03 DIAGNOSIS — M7061 Trochanteric bursitis, right hip: Secondary | ICD-10-CM | POA: Diagnosis not present

## 2018-06-03 DIAGNOSIS — J439 Emphysema, unspecified: Secondary | ICD-10-CM | POA: Diagnosis not present

## 2018-06-03 DIAGNOSIS — M79605 Pain in left leg: Secondary | ICD-10-CM | POA: Diagnosis not present

## 2018-06-03 DIAGNOSIS — E559 Vitamin D deficiency, unspecified: Secondary | ICD-10-CM

## 2018-06-03 DIAGNOSIS — C9190 Lymphoid leukemia, unspecified not having achieved remission: Secondary | ICD-10-CM | POA: Diagnosis not present

## 2018-06-03 DIAGNOSIS — E538 Deficiency of other specified B group vitamins: Secondary | ICD-10-CM | POA: Insufficient documentation

## 2018-06-03 DIAGNOSIS — F1290 Cannabis use, unspecified, uncomplicated: Secondary | ICD-10-CM | POA: Insufficient documentation

## 2018-06-03 DIAGNOSIS — M1288 Other specific arthropathies, not elsewhere classified, other specified site: Secondary | ICD-10-CM | POA: Insufficient documentation

## 2018-06-03 DIAGNOSIS — M5442 Lumbago with sciatica, left side: Secondary | ICD-10-CM | POA: Insufficient documentation

## 2018-06-03 DIAGNOSIS — F17210 Nicotine dependence, cigarettes, uncomplicated: Secondary | ICD-10-CM | POA: Insufficient documentation

## 2018-06-03 DIAGNOSIS — F1190 Opioid use, unspecified, uncomplicated: Secondary | ICD-10-CM

## 2018-06-03 DIAGNOSIS — F428 Other obsessive-compulsive disorder: Secondary | ICD-10-CM | POA: Diagnosis not present

## 2018-06-03 DIAGNOSIS — G894 Chronic pain syndrome: Secondary | ICD-10-CM | POA: Insufficient documentation

## 2018-06-03 DIAGNOSIS — M5136 Other intervertebral disc degeneration, lumbar region: Secondary | ICD-10-CM | POA: Diagnosis not present

## 2018-06-03 DIAGNOSIS — F329 Major depressive disorder, single episode, unspecified: Secondary | ICD-10-CM | POA: Diagnosis not present

## 2018-06-03 DIAGNOSIS — Z791 Long term (current) use of non-steroidal anti-inflammatories (NSAID): Secondary | ICD-10-CM | POA: Insufficient documentation

## 2018-06-03 DIAGNOSIS — R937 Abnormal findings on diagnostic imaging of other parts of musculoskeletal system: Secondary | ICD-10-CM

## 2018-06-03 DIAGNOSIS — M899 Disorder of bone, unspecified: Secondary | ICD-10-CM

## 2018-06-03 DIAGNOSIS — M47896 Other spondylosis, lumbar region: Secondary | ICD-10-CM | POA: Insufficient documentation

## 2018-06-03 DIAGNOSIS — G8929 Other chronic pain: Secondary | ICD-10-CM

## 2018-06-03 DIAGNOSIS — Z5181 Encounter for therapeutic drug level monitoring: Secondary | ICD-10-CM | POA: Diagnosis not present

## 2018-06-03 DIAGNOSIS — Z79899 Other long term (current) drug therapy: Secondary | ICD-10-CM | POA: Insufficient documentation

## 2018-06-03 DIAGNOSIS — M8938 Hypertrophy of bone, other site: Secondary | ICD-10-CM | POA: Diagnosis not present

## 2018-06-03 DIAGNOSIS — Z720 Tobacco use: Secondary | ICD-10-CM

## 2018-06-03 DIAGNOSIS — M47816 Spondylosis without myelopathy or radiculopathy, lumbar region: Secondary | ICD-10-CM

## 2018-06-03 MED ORDER — ORPHENADRINE CITRATE 30 MG/ML IJ SOLN
60.0000 mg | Freq: Once | INTRAMUSCULAR | Status: AC
  Administered 2018-06-03: 60 mg via INTRAMUSCULAR
  Filled 2018-06-03: qty 2

## 2018-06-03 MED ORDER — MELOXICAM 15 MG PO TABS: 15.0000 mg | ORAL_TABLET | Freq: Every day | ORAL | 0 refills | Status: DC

## 2018-06-03 MED ORDER — VITAMIN D3 125 MCG (5000 UT) PO CAPS: 1.0000 | ORAL_CAPSULE | Freq: Every day | ORAL | 5 refills | Status: AC

## 2018-06-03 MED ORDER — MAGNESIUM 500 MG PO CAPS: 500.0000 mg | ORAL_CAPSULE | Freq: Two times a day (BID) | ORAL | 5 refills | Status: DC

## 2018-06-03 MED ORDER — GNP CALCIUM 1200 1200-1000 MG-UNIT PO CHEW: 1200.0000 mg | CHEWABLE_TABLET | Freq: Every day | ORAL | 5 refills | Status: DC

## 2018-06-03 MED ORDER — KETOROLAC TROMETHAMINE 60 MG/2ML IM SOLN
60.0000 mg | Freq: Once | INTRAMUSCULAR | Status: AC
  Administered 2018-06-03: 60 mg via INTRAMUSCULAR
  Filled 2018-06-03: qty 2

## 2018-06-03 MED ORDER — ERGOCALCIFEROL 1.25 MG (50000 UT) PO CAPS: 50000.0000 [IU] | ORAL_CAPSULE | ORAL | 0 refills | Status: AC

## 2018-06-03 MED ORDER — TIZANIDINE HCL 2 MG PO TABS: 2.0000 mg | ORAL_TABLET | Freq: Three times a day (TID) | ORAL | 0 refills | Status: DC | PRN

## 2018-06-03 NOTE — Patient Instructions (Addendum)
____________________________________________________________________________________________  Medication Rules  Applies to: All patients receiving prescriptions (written or electronic).  Pharmacy of record: Pharmacy where electronic prescriptions will be sent. If written prescriptions are taken to a different pharmacy, please inform the nursing staff. The pharmacy listed in the electronic medical record should be the one where you would like electronic prescriptions to be sent.  Prescription refills: Only during scheduled appointments. Applies to both, written and electronic prescriptions.  NOTE: The following applies primarily to controlled substances (Opioid* Pain Medications).   Patient's responsibilities: 1. Pain Pills: Bring all pain pills to every appointment (except for procedure appointments). 2. Pill Bottles: Bring pills in original pharmacy bottle. Always bring newest bottle. Bring bottle, even if empty. 3. Medication refills: You are responsible for knowing and keeping track of what medications you need refilled. The day before your appointment, write a list of all prescriptions that need to be refilled. Bring that list to your appointment and give it to the admitting nurse. Prescriptions will be written only during appointments. If you forget a medication, it will not be "Called in", "Faxed", or "electronically sent". You will need to get another appointment to get these prescribed. 4. Prescription Accuracy: You are responsible for carefully inspecting your prescriptions before leaving our office. Have the discharge nurse carefully go over each prescription with you, before taking them home. Make sure that your name is accurately spelled, that your address is correct. Check the name and dose of your medication to make sure it is accurate. Check the number of pills, and the written instructions to make sure they are clear and accurate. Make sure that you are given enough medication to last  until your next medication refill appointment. 5. Taking Medication: Take medication as prescribed. Never take more pills than instructed. Never take medication more frequently than prescribed. Taking less pills or less frequently is permitted and encouraged, when it comes to controlled substances (written prescriptions).  6. Inform other Doctors: Always inform, all of your healthcare providers, of all the medications you take. 7. Pain Medication from other Providers: You are not allowed to accept any additional pain medication from any other Doctor or Healthcare provider. There are two exceptions to this rule. (see below) In the event that you require additional pain medication, you are responsible for notifying us, as stated below. 8. Medication Agreement: You are responsible for carefully reading and following our Medication Agreement. This must be signed before receiving any prescriptions from our practice. Safely store a copy of your signed Agreement. Violations to the Agreement will result in no further prescriptions. (Additional copies of our Medication Agreement are available upon request.) 9. Laws, Rules, & Regulations: All patients are expected to follow all Federal and State Laws, Statutes, Rules, & Regulations. Ignorance of the Laws does not constitute a valid excuse. The use of any illegal substances is prohibited. 10. Adopted CDC guidelines & recommendations: Target dosing levels will be at or below 60 MME/day. Use of benzodiazepines** is not recommended.  Exceptions: There are only two exceptions to the rule of not receiving pain medications from other Healthcare Providers. 1. Exception #1 (Emergencies): In the event of an emergency (i.e.: accident requiring emergency care), you are allowed to receive additional pain medication. However, you are responsible for: As soon as you are able, call our office [phone], at any time of the day or night, and leave a message stating your name, the  date and nature of the emergency, and the name and dose of the medication   prescribed. In the event that your call is answered by a member of our staff, make sure to document and save the date, time, and the name of the person that took your information.  2. Exception #2 (Planned Surgery): In the event that you are scheduled by another doctor or dentist to have any type of surgery or procedure, you are allowed (for a period no longer than 30 days), to receive additional pain medication, for the acute post-op pain. However, in this case, you are responsible for picking up a copy of our "Post-op Pain Management for Surgeons" handout, and giving it to your surgeon or dentist. This document is available at our office, and does not require an appointment to obtain it. Simply go to our office during business hours (Monday-Thursday from 8:00 AM to 4:00 PM) (Friday 8:00 AM to 12:00 Noon) or if you have a scheduled appointment with us, prior to your surgery, and ask for it by name. In addition, you will need to provide us with your name, name of your surgeon, type of surgery, and date of procedure or surgery.  *Opioid medications include: morphine, codeine, oxycodone, oxymorphone, hydrocodone, hydromorphone, meperidine, tramadol, tapentadol, buprenorphine, fentanyl, methadone. **Benzodiazepine medications include: diazepam (Valium), alprazolam (Xanax), clonazepam (Klonopine), lorazepam (Ativan), clorazepate (Tranxene), chlordiazepoxide (Librium), estazolam (Prosom), oxazepam (Serax), temazepam (Restoril), triazolam (Halcion) (Last updated: 11/13/2017) ____________________________________________________________________________________________   ____________________________________________________________________________________________  Medication Recommendations and Reminders  Applies to: All patients receiving prescriptions (written and/or electronic).  Medication Rules & Regulations: These rules and  regulations exist for your safety and that of others. They are not flexible and neither are we. Dismissing or ignoring them will be considered "non-compliance" with medication therapy, resulting in complete and irreversible termination of such therapy. (See document titled "Medication Rules" for more details.) In all conscience, because of safety reasons, we cannot continue providing a therapy where the patient does not follow instructions.  Pharmacy of record:   Definition: This is the pharmacy where your electronic prescriptions will be sent.   We do not endorse any particular pharmacy.  You are not restricted in your choice of pharmacy.  The pharmacy listed in the electronic medical record should be the one where you want electronic prescriptions to be sent.  If you choose to change pharmacy, simply notify our nursing staff of your choice of new pharmacy.  Recommendations:  Keep all of your pain medications in a safe place, under lock and key, even if you live alone.   After you fill your prescription, take 1 week's worth of pills and put them away in a safe place. You should keep a separate, properly labeled bottle for this purpose. The remainder should be kept in the original bottle. Use this as your primary supply, until it runs out. Once it's gone, then you know that you have 1 week's worth of medicine, and it is time to come in for a prescription refill. If you do this correctly, it is unlikely that you will ever run out of medicine.  To make sure that the above recommendation works, it is very important that you make sure your medication refill appointments are scheduled at least 1 week before you run out of medicine. To do this in an effective manner, make sure that you do not leave the office without scheduling your next medication management appointment. Always ask the nursing staff to show you in your prescription , when your medication will be running out. Then arrange for the  receptionist to get you a return   appointment, at least 7 days before you run out of medicine. Do not wait until you have 1 or 2 pills left, to come in. This is very poor planning and does not take into consideration that we may need to cancel appointments due to bad weather, sickness, or emergencies affecting our staff.  "Partial Fill": If for any reason your pharmacy does not have enough pills/tablets to completely fill or refill your prescription, do not allow for a "partial fill". You will need a separate prescription to fill the remaining amount, which we will not provide. If the reason for the partial fill is your insurance, you will need to talk to the pharmacist about payment alternatives for the remaining tablets, but again, do not accept a partial fill.  Prescription refills and/or changes in medication(s):   Prescription refills, and/or changes in dose or medication, will be conducted only during scheduled medication management appointments. (Applies to both, written and electronic prescriptions.)  No refills on procedure days. No medication will be changed or started on procedure days. No changes, adjustments, and/or refills will be conducted on a procedure day. Doing so will interfere with the diagnostic portion of the procedure.  No phone refills. No medications will be "called into the pharmacy".  No Fax refills.  No weekend refills.  No Holliday refills.  No after hours refills.  Remember:  Business hours are:  Monday to Thursday 8:00 AM to 4:00 PM Provider's Schedule: Crystal King, NP - Appointments are:  Medication management: Monday to Thursday 8:00 AM to 4:00 PM Jerred Zaremba, MD - Appointments are:  Medication management: Monday and Wednesday 8:00 AM to 4:00 PM Procedure day: Tuesday and Thursday 7:30 AM to 4:00 PM Bilal Lateef, MD - Appointments are:  Medication management: Tuesday and Thursday 8:00 AM to 4:00 PM Procedure day: Monday and Wednesday 7:30 AM to  4:00 PM (Last update: 11/13/2017) ____________________________________________________________________________________________   ____________________________________________________________________________________________  CANNABIDIOL (AKA: CBD Oil or Pills)  Applies to: All patients receiving prescriptions of controlled substances (written and/or electronic).  General Information: Cannabidiol (CBD) was discovered in 1940. It is one of some 113 identified cannabinoids in cannabis (Marijuana) plants, accounting for up to 40% of the plant's extract. As of 2018, preliminary clinical research on cannabidiol included studies of anxiety, cognition, movement disorders, and pain.  Cannabidiol is consummed in multiple ways, including inhalation of cannabis smoke or vapor, as an aerosol spray into the cheek, and by mouth. It may be supplied as CBD oil containing CBD as the active ingredient (no added tetrahydrocannabinol (THC) or terpenes), a full-plant CBD-dominant hemp extract oil, capsules, dried cannabis, or as a liquid solution. CBD is thought not have the same psychoactivity as THC, and may affect the actions of THC. Studies suggest that CBD may interact with different biological targets, including cannabinoid receptors and other neurotransmitter receptors. As of 2018 the mechanism of action for its biological effects has not been determined.  In the [place], cannabidiol has a limited approval by the Food and Drug Administration (FDA) for treatment of only two types of epilepsy disorders. The side effects of long-term use of the drug include somnolence, decreased appetite, diarrhea, fatigue, malaise, weakness, sleeping problems, and others.  CBD remains a Schedule I drug prohibited for any use.  Legality: Some manufacturers ship CBD products nationally, an illegal action which the FDA has not enforced in 2018, with CBD remaining the subject of an FDA investigational new drug evaluation, and is  not considered legal as a dietary supplement or   food ingredient as of December 2018. Federal illegality has made it difficult historically to conduct research on CBD. CBD is openly sold in head shops and health food stores in some states where such sales have not been explicitly legalized.  Warning: Because it is not FDA approved for general use or treatment of pain, it is not required to undergo the same manufacturing controls as prescription drugs.  This means that the available cannabidiol (CBD) may be contaminated with THC.  If this is the case, it will trigger a positive urine drug screen (UDS) test for cannabinoids (Marijuana).  Because a positive UDS for illicit substances is a violation of our medication agreement, your opioid analgesics (pain medicine) may be permanently discontinued. (Last update: 12/04/2017) ____________________________________________________________________________________________   ____________________________________________________________________________________________  Preparing for Procedure with Sedation  Instructions: . Oral Intake: Do not eat or drink anything for at least 8 hours prior to your procedure. . Transportation: Public transportation is not allowed. Bring an adult driver. The driver must be physically present in our waiting room before any procedure can be started. Marland Kitchen Physical Assistance: Bring an adult physically capable of assisting you, in the event you need help. This adult should keep you company at home for at least 6 hours after the procedure. . Blood Pressure Medicine: Take your blood pressure medicine with a sip of water the morning of the procedure. . Blood thinners: Notify our staff if you are taking any blood thinners. Depending on which one you take, there will be specific instructions on how and when to stop it. . Diabetics on insulin: Notify the staff so that you can be scheduled 1st case in the morning. If your diabetes requires high  dose insulin, take only  of your normal insulin dose the morning of the procedure and notify the staff that you have done so. . Preventing infections: Shower with an antibacterial soap the morning of your procedure. . Build-up your immune system: Take 1000 mg of Vitamin C with every meal (3 times a day) the day prior to your procedure. Marland Kitchen Antibiotics: Inform the staff if you have a condition or reason that requires you to take antibiotics before dental procedures. . Pregnancy: If you are pregnant, call and cancel the procedure. . Sickness: If you have a cold, fever, or any active infections, call and cancel the procedure. . Arrival: You must be in the facility at least 30 minutes prior to your scheduled procedure. . Children: Do not bring children with you. . Dress appropriately: Bring dark clothing that you would not mind if they get stained. . Valuables: Do not bring any jewelry or valuables.  Procedure appointments are reserved for interventional treatments only. Marland Kitchen No Prescription Refills. . No medication changes will be discussed during procedure appointments. . No disability issues will be discussed.  Reasons to call and reschedule or cancel your procedure: (Following these recommendations will minimize the risk of a serious complication.) . Surgeries: Avoid having procedures within 2 weeks of any surgery. (Avoid for 2 weeks before or after any surgery). . Flu Shots: Avoid having procedures within 2 weeks of a flu shots or . (Avoid for 2 weeks before or after immunizations). . Barium: Avoid having a procedure within 7-10 days after having had a radiological study involving the use of radiological contrast. (Myelograms, Barium swallow or enema study). . Heart attacks: Avoid any elective procedures or surgeries for the initial 6 months after a "Myocardial Infarction" (Heart Attack). . Blood thinners: It is imperative that you stop  these medications before procedures. Let us know if you if you  take any blood thinner.  . Infection: Avoid procedures during or within two weeks of an infection (including chest colds or gastrointestinal problems). Symptoms associated with infections include: Localized redness, fever, chills, night sweats or profuse sweating, burning sensation when voiding, cough, congestion, stuffiness, runny nose, sore throat, diarrhea, nausea, vomiting, cold or Flu symptoms, recent or current infections. It is specially important if the infection is over the area that we intend to treat. Marland Kitchen Heart and lung problems: Symptoms that may suggest an active cardiopulmonary problem include: cough, chest pain, breathing difficulties or shortness of breath, dizziness, ankle swelling, uncontrolled high or unusually low blood pressure, and/or palpitations. If you are experiencing any of these symptoms, cancel your procedure and contact your primary care physician for an evaluation.  Remember:  Regular Business hours are:  Monday to Thursday 8:00 AM to 4:00 PM  Provider's Schedule: [name], MD:  Procedure days: Tuesday and Thursday 7:30 AM to 4:00 PM  Gillis Santa, MD:  Procedure days: Monday and Wednesday 7:30 AM to 4:00 PM ____________________________________________________________________________________________   ____________________________________________________________________________________________  Pain Prevention Technique  Definition:   A technique used to minimize the effects of an activity known to cause inflammation or swelling, which in turn leads to an increase in pain.  Purpose: To prevent swelling from occurring. It is based on the fact that it is easier to prevent swelling from happening than it is to get rid of it, once it occurs.  Contraindications: 1. Anyone with allergy or hypersensitivity to the recommended medications. 2. Anyone taking anticoagulants (Blood Thinners) (e.g., Coumadin, Warfarin, Plavix, etc.). 3. Patients in Renal  Failure.  Technique: Before you undertake an activity known to cause pain, or a flare-up of your chronic pain, and before you experience any pain, do the following:  1. On a full stomach, take 4 (four) over the counter Ibuprofens 200mg  tablets (Motrin), for a total of 800 mg. 2. In addition, take over the counter Magnesium 400 to 500 mg, before doing the activity.  3. Six (6) hours later, again on a full stomach, repeat the Ibuprofen. 4. That night, take a warm shower and stretch under the running warm water.  This technique may be sufficient to abort the pain and discomfort before it happens. Keep in mind that it takes a lot less medication to prevent swelling than it takes to eliminate it once it occurs.  ____________________________________________________________________________________________   GENERAL RISKS AND COMPLICATIONS  What are the risk, side effects and possible complications? Generally speaking, most procedures are safe.  However, with any procedure there are risks, side effects, and the possibility of complications.  The risks and complications are dependent upon the sites that are lesioned, or the type of nerve block to be performed.  The closer the procedure is to the spine, the more serious the risks are.  Great care is taken when placing the radio frequency needles, block needles or lesioning probes, but sometimes complications can occur. 1. Infection: Any time there is an injection through the skin, there is a risk of infection.  This is why sterile conditions are used for these blocks.  There are four possible types of infection. 1. Localized skin infection. 2. Central Nervous System Infection-This can be in the form of Meningitis, which can be deadly. 3. Epidural Infections-This can be in the form of an epidural abscess, which can cause pressure inside of the spine, causing compression of the spinal cord with subsequent  paralysis. This would require an emergency surgery  to decompress, and there are no guarantees that the patient would recover from the paralysis. 4. Discitis-This is an infection of the intervertebral discs.  It occurs in about 1% of discography procedures.  It is difficult to treat and it may lead to surgery.        2. Pain: the needles have to go through skin and soft tissues, will cause soreness.       3. Damage to internal structures:  The nerves to be lesioned may be near blood vessels or    other nerves which can be potentially damaged.       4. Bleeding: Bleeding is more common if the patient is taking blood thinners such as  aspirin, Coumadin, Ticiid, Plavix, etc., or if he/she have some genetic predisposition  such as hemophilia. Bleeding into the spinal canal can cause compression of the spinal  cord with subsequent paralysis.  This would require an emergency surgery to  decompress and there are no guarantees that the patient would recover from the  paralysis.       5. Pneumothorax:  Puncturing of a lung is a possibility, every time a needle is introduced in  the area of the chest or upper back.  Pneumothorax refers to free air around the  collapsed lung(s), inside of the thoracic cavity (chest cavity).  Another two possible  complications related to a similar event would include: Hemothorax and Chylothorax.   These are variations of the Pneumothorax, where instead of air around the collapsed  lung(s), you may have blood or chyle, respectively.       6. Spinal headaches: They may occur with any procedures in the area of the spine.       7. Persistent CSF (Cerebro-Spinal Fluid) leakage: This is a rare problem, but may occur  with prolonged intrathecal or epidural catheters either due to the formation of a fistulous  track or a dural tear.       8. Nerve damage: By working so close to the spinal cord, there is always a possibility of  nerve damage, which could be as serious as a permanent spinal cord injury with  paralysis.       9. Death:   Although rare, severe deadly allergic reactions known as "Anaphylactic  reaction" can occur to any of the medications used.      10. Worsening of the symptoms:  We can always make thing worse.  What are the chances of something like this happening? Chances of any of this occuring are extremely low.  By statistics, you have more of a chance of getting killed in a motor vehicle accident: while driving to the hospital than any of the above occurring .  Nevertheless, you should be aware that they are possibilities.  In general, it is similar to taking a shower.  Everybody knows that you can slip, hit your head and get killed.  Does that mean that you should not shower again?  Nevertheless always keep in mind that statistics do not mean anything if you happen to be on the wrong side of them.  Even if a procedure has a 1 (one) in a 1,000,000 (million) chance of going wrong, it you happen to be that one..Also, keep in mind that by statistics, you have more of a chance of having something go wrong when taking medications.  Who should not have this procedure? If you are on a blood thinning medication (e.g. Coumadin, Plavix,  see list of "Blood Thinners"), or if you have an active infection going on, you should not have the procedure.  If you are taking any blood thinners, please inform your physician.  How should I prepare for this procedure?  Do not eat or drink anything at least six hours prior to the procedure.  Bring a driver with you .  It cannot be a taxi.  Come accompanied by an adult that can drive you back, and that is strong enough to help you if your legs get weak or numb from the local anesthetic.  Take all of your medicines the morning of the procedure with just enough water to swallow them.  If you have diabetes, make sure that you are scheduled to have your procedure done first thing in the morning, whenever possible.  If you have diabetes, take only half of your insulin dose and notify our  nurse that you have done so as soon as you arrive at the clinic.  If you are diabetic, but only take blood sugar pills (oral hypoglycemic), then do not take them on the morning of your procedure.  You may take them after you have had the procedure.  Do not take aspirin or any aspirin-containing medications, at least eleven (11) days prior to the procedure.  They may prolong bleeding.  Wear loose fitting clothing that may be easy to take off and that you would not mind if it got stained with Betadine or blood.  Do not wear any jewelry or perfume  Remove any nail coloring.  It will interfere with some of our monitoring equipment.  NOTE: Remember that this is not meant to be interpreted as a complete list of all possible complications.  Unforeseen problems may occur.  BLOOD THINNERS The following drugs contain aspirin or other products, which can cause increased bleeding during surgery and should not be taken for 2 weeks prior to and 1 week after surgery.  If you should need take something for relief of minor pain, you may take acetaminophen which is found in Tylenol,m Datril, Anacin-3 and Panadol. It is not blood thinner. The products listed below are.  Do not take any of the products listed below in addition to any listed on your instruction sheet.  A.P.C or A.P.C with Codeine Codeine Phosphate Capsules #3 Ibuprofen Ridaura  ABC compound Congesprin Imuran rimadil  Advil Cope Indocin Robaxisal  Alka-Seltzer Effervescent Pain Reliever and Antacid Coricidin or Coricidin-D  Indomethacin Rufen  Alka-Seltzer plus Cold Medicine Cosprin Ketoprofen S-A-C Tablets  Anacin Analgesic Tablets or Capsules Coumadin Korlgesic Salflex  Anacin Extra Strength Analgesic tablets or capsules CP-2 Tablets Lanoril Salicylate  Anaprox Cuprimine Capsules Levenox Salocol  Anexsia-D Dalteparin Magan Salsalate  Anodynos Darvon compound Magnesium Salicylate Sine-off  Ansaid Dasin Capsules Magsal Sodium Salicylate   Anturane Depen Capsules Marnal Soma  APF Arthritis pain formula Dewitt's Pills Measurin Stanback  Argesic Dia-Gesic Meclofenamic Sulfinpyrazone  Arthritis Bayer Timed Release Aspirin Diclofenac Meclomen Sulindac  Arthritis pain formula Anacin Dicumarol Medipren Supac  Analgesic (Safety coated) Arthralgen Diffunasal Mefanamic Suprofen  Arthritis Strength Bufferin Dihydrocodeine Mepro Compound Suprol  Arthropan liquid Dopirydamole Methcarbomol with Aspirin Synalgos  ASA tablets/Enseals Disalcid Micrainin Tagament  Ascriptin Doan's Midol Talwin  Ascriptin A/D Dolene Mobidin Tanderil  Ascriptin Extra Strength Dolobid Moblgesic Ticlid  Ascriptin with Codeine Doloprin or Doloprin with Codeine Momentum Tolectin  Asperbuf Duoprin Mono-gesic Trendar  Aspergum Duradyne Motrin or Motrin IB Triminicin  Aspirin plain, buffered or enteric coated Durasal Myochrisine Trigesic  Aspirin  Suppositories Easprin Nalfon Trillsate  Aspirin with Codeine Ecotrin Regular or Extra Strength Naprosyn Uracel  Atromid-S Efficin Naproxen Ursinus  Auranofin Capsules Elmiron Neocylate Vanquish  Axotal Emagrin Norgesic Verin  Azathioprine Empirin or Empirin with Codeine Normiflo Vitamin E  Azolid Emprazil Nuprin Voltaren  Bayer Aspirin plain, buffered or children's or timed BC Tablets or powders Encaprin Orgaran Warfarin Sodium  Buff-a-Comp Enoxaparin Orudis Zorpin  Buff-a-Comp with Codeine Equegesic Os-Cal-Gesic   Buffaprin Excedrin plain, buffered or Extra Strength Oxalid   Bufferin Arthritis Strength Feldene Oxphenbutazone   Bufferin plain or Extra Strength Feldene Capsules Oxycodone with Aspirin   Bufferin with Codeine Fenoprofen Fenoprofen Pabalate or Pabalate-SF   Buffets II Flogesic Panagesic   Buffinol plain or Extra Strength Florinal or Florinal with Codeine Panwarfarin   Buf-Tabs Flurbiprofen Penicillamine   Butalbital Compound Four-way cold tablets Penicillin   Butazolidin Fragmin Pepto-Bismol    Carbenicillin Geminisyn Percodan   Carna Arthritis Reliever Geopen Persantine   Carprofen Gold's salt Persistin   Chloramphenicol Goody's Phenylbutazone   Chloromycetin Haltrain Piroxlcam   Clmetidine heparin Plaquenil   Cllnoril Hyco-pap Ponstel   Clofibrate Hydroxy chloroquine Propoxyphen         Before stopping any of these medications, be sure to consult the physician who ordered them.  Some, such as Coumadin (Warfarin) are ordered to prevent or treat serious conditions such as "deep thrombosis", "pumonary embolisms", and other heart problems.  The amount of time that you may need off of the medication may also vary with the medication and the reason for which you were taking it.  If you are taking any of these medications, please make sure you notify your pain physician before you undergo any procedures.         Pain Management Discharge Instructions  General Discharge Instructions :  If you need to reach your doctor call: Monday-Friday 8:00 am - 4:00 pm at [phone] or toll free [phone].  After clinic hours [phone] to have operator reach doctor.  Bring all of your medication bottles to all your appointments in the pain clinic.  To cancel or reschedule your appointment with Pain Management please remember to call 24 hours in advance to avoid a fee.  Refer to the educational materials which you have been given on: General Risks, I had my Procedure. Discharge Instructions, Post Sedation.  Post Procedure Instructions:  The drugs you were given will stay in your system until tomorrow, so for the next 24 hours you should not drive, make any legal decisions or drink any alcoholic beverages.  You may eat anything you prefer, but it is better to start with liquids then soups and crackers, and gradually work up to solid foods.  Please notify your doctor immediately if you have any unusual bleeding, trouble breathing or pain that is not related to your normal  pain.  Depending on the type of procedure that was done, some parts of your body may feel week and/or numb.  This usually clears up by tonight or the next day.  Walk with the use of an assistive device or accompanied by an adult for the 24 hours.  You may use ice on the affected area for the first 24 hours.  Put ice in a Ziploc bag and cover with a towel and place against area 15 minutes on 15 minutes off.  You may switch to heat after 24 hours.Epidural Steroid Injection Patient Information  Description: The epidural space surrounds the nerves as they exit the spinal cord.  In  some patients, the nerves can be compressed and inflamed by a bulging disc or a tight spinal canal (spinal stenosis).  By injecting steroids into the epidural space, we can bring irritated nerves into direct contact with a potentially helpful medication.  These steroids act directly on the irritated nerves and can reduce swelling and inflammation which often leads to decreased pain.  Epidural steroids may be injected anywhere along the spine and from the neck to the low back depending upon the location of your pain.   After numbing the skin with local anesthetic (like Novocaine), a small needle is passed into the epidural space slowly.  You may experience a sensation of pressure while this is being done.  The entire block usually last less than 10 minutes.  Conditions which may be treated by epidural steroids:   Low back and leg pain  Neck and arm pain  Spinal stenosis  Post-laminectomy syndrome  Herpes zoster (shingles) pain  Pain from compression fractures  Preparation for the injection:  3. Do not eat any solid food or dairy products within 8 hours of your appointment.  4. You may drink clear liquids up to 3 hours before appointment.  Clear liquids include water, black coffee, juice or soda.  No milk or cream please. 5. You may take your regular medication, including pain medications, with a sip of water  before your appointment  Diabetics should hold regular insulin (if taken separately) and take 1/2 normal NPH dos the morning of the procedure.  Carry some sugar containing items with you to your appointment. 6. A driver must accompany you and be prepared to drive you home after your procedure.  7. Bring all your current medications with your. 8. An IV may be inserted and sedation may be given at the discretion of the physician.   9. A blood pressure cuff, EKG and other monitors will often be applied during the procedure.  Some patients may need to have extra oxygen administered for a short period. 10. You will be asked to provide medical information, including your allergies, prior to the procedure.  We must know immediately if you are taking blood thinners (like Coumadin/Warfarin)  Or if you are allergic to IV iodine contrast (dye). We must know if you could possible be pregnant.  Possible side-effects:  Bleeding from needle site  Infection (rare, may require surgery)  Nerve injury (rare)  Numbness & tingling (temporary)  Difficulty urinating (rare, temporary)  Spinal headache ( a headache worse with upright posture)  Light -headedness (temporary)  Pain at injection site (several days)  Decreased blood pressure (temporary)  Weakness in arm/leg (temporary)  Pressure sensation in back/neck (temporary)  Call if you experience:  Fever/chills associated with headache or increased back/neck pain.  Headache worsened by an upright position.  New onset weakness or numbness of an extremity below the injection site  Hives or difficulty breathing (go to the emergency room)  Inflammation or drainage at the infection site  Severe back/neck pain  Any new symptoms which are concerning to you  Please note:  Although the local anesthetic injected can often make your back or neck feel good for several hours after the injection, the pain will likely return.  It takes 3-7 days for  steroids to work in the epidural space.  You may not notice any pain relief for at least that one week.  If effective, we will often do a series of three injections spaced 3-6 weeks apart to maximally decrease your pain.  After the initial series, we generally will wait several months before considering a repeat injection of the same type.  If you have any questions, please call [phone] [facility]

## 2018-06-10 ENCOUNTER — Other Ambulatory Visit: Payer: Self-pay | Admitting: Emergency Medicine

## 2018-06-10 DIAGNOSIS — Z720 Tobacco use: Secondary | ICD-10-CM

## 2018-06-11 ENCOUNTER — Ambulatory Visit
Admission: RE | Admit: 2018-06-11 | Discharge: 2018-06-11 | Disposition: A | Discharge status: Home / Self Care | Payer: BLUE CROSS/BLUE SHIELD | Source: Ambulatory Visit | Attending: Pain Medicine | Admitting: Pain Medicine

## 2018-06-11 ENCOUNTER — Encounter: Payer: Self-pay | Admitting: Pain Medicine

## 2018-06-11 ENCOUNTER — Other Ambulatory Visit: Payer: Self-pay

## 2018-06-11 ENCOUNTER — Ambulatory Visit (HOSPITAL_BASED_OUTPATIENT_CLINIC_OR_DEPARTMENT_OTHER): Payer: BLUE CROSS/BLUE SHIELD | Admitting: Pain Medicine

## 2018-06-11 VITALS — BP 104/63 | HR 67 | Temp 98.6°F | Resp 18 | Ht 67.0 in | Wt 150.0 lb

## 2018-06-11 DIAGNOSIS — M51369 Other intervertebral disc degeneration, lumbar region without mention of lumbar back pain or lower extremity pain: Secondary | ICD-10-CM

## 2018-06-11 DIAGNOSIS — M5136 Other intervertebral disc degeneration, lumbar region: Secondary | ICD-10-CM

## 2018-06-11 DIAGNOSIS — G8929 Other chronic pain: Secondary | ICD-10-CM

## 2018-06-11 DIAGNOSIS — M79604 Pain in right leg: Secondary | ICD-10-CM

## 2018-06-11 DIAGNOSIS — M545 Low back pain: Secondary | ICD-10-CM | POA: Diagnosis not present

## 2018-06-11 DIAGNOSIS — M79605 Pain in left leg: Secondary | ICD-10-CM

## 2018-06-11 DIAGNOSIS — M5442 Lumbago with sciatica, left side: Secondary | ICD-10-CM

## 2018-06-11 DIAGNOSIS — M5441 Lumbago with sciatica, right side: Secondary | ICD-10-CM

## 2018-06-11 MED ORDER — FENTANYL CITRATE (PF) 100 MCG/2ML IJ SOLN
25.0000 ug | INTRAMUSCULAR | Status: DC | PRN
  Administered 2018-06-11: 50 ug via INTRAVENOUS
  Filled 2018-06-11: qty 2

## 2018-06-11 MED ORDER — ROPIVACAINE HCL 2 MG/ML IJ SOLN
1.0000 mL | Freq: Once | INTRAMUSCULAR | Status: AC
  Administered 2018-06-11: 1 mL via EPIDURAL
  Filled 2018-06-11: qty 10

## 2018-06-11 MED ORDER — LACTATED RINGERS IV SOLN
1000.0000 mL | Freq: Once | INTRAVENOUS | Status: AC
  Administered 2018-06-11: 1000 mL via INTRAVENOUS

## 2018-06-11 MED ORDER — ROPIVACAINE HCL 2 MG/ML IJ SOLN
2.0000 mL | Freq: Once | INTRAMUSCULAR | Status: AC
  Administered 2018-06-11: 2 mL via EPIDURAL
  Filled 2018-06-11: qty 10

## 2018-06-11 MED ORDER — LIDOCAINE HCL 2 % IJ SOLN
20.0000 mL | Freq: Once | INTRAMUSCULAR | Status: AC
  Administered 2018-06-11: 400 mg
  Filled 2018-06-11: qty 40

## 2018-06-11 MED ORDER — SODIUM CHLORIDE 0.9% FLUSH
2.0000 mL | Freq: Once | INTRAVENOUS | Status: AC
  Administered 2018-06-11: 2 mL

## 2018-06-11 MED ORDER — SODIUM CHLORIDE 0.9 % IJ SOLN
INTRAMUSCULAR | Status: AC
  Filled 2018-06-11: qty 10

## 2018-06-11 MED ORDER — MIDAZOLAM HCL 5 MG/5ML IJ SOLN
1.0000 mg | INTRAMUSCULAR | Status: DC | PRN
  Administered 2018-06-11: 3 mg via INTRAVENOUS
  Filled 2018-06-11: qty 5

## 2018-06-11 MED ORDER — IOPAMIDOL (ISOVUE-M 200) INJECTION 41%
10.0000 mL | Freq: Once | INTRAMUSCULAR | Status: AC
  Administered 2018-06-11: 10 mL via EPIDURAL
  Filled 2018-06-11: qty 10

## 2018-06-11 MED ORDER — TRIAMCINOLONE ACETONIDE 40 MG/ML IJ SUSP
40.0000 mg | Freq: Once | INTRAMUSCULAR | Status: AC
  Administered 2018-06-11: 40 mg
  Filled 2018-06-11: qty 1

## 2018-06-11 MED ORDER — DEXAMETHASONE SODIUM PHOSPHATE 10 MG/ML IJ SOLN
10.0000 mg | Freq: Once | INTRAMUSCULAR | Status: AC
  Administered 2018-06-11: 10 mg
  Filled 2018-06-11: qty 1

## 2018-06-11 MED ORDER — SODIUM CHLORIDE 0.9% FLUSH
1.0000 mL | Freq: Once | INTRAVENOUS | Status: AC
  Administered 2018-06-11: 1 mL

## 2018-06-11 NOTE — Progress Notes (Signed)
Patient's Name: Monique [name]  MRN: [id number]  Referring Provider: [name], MD  DOB: 10-Sep-1971  PCP: [name], MD  DOS: 06/11/2018  Note by: Gaspar Cola, MD  Service setting: Ambulatory outpatient  Specialty: Interventional Pain Management  Patient type: Established  Location: [facility] (AMB) Pain Management Facility  Visit type: Interventional Procedure   Primary Reason for Visit: Interventional Pain Management Treatment. CC: Back Pain (low)  Procedure #1:  Anesthesia, Analgesia, Anxiolysis:  Type: Diagnostic Trans-Foraminal Epidural Steroid Injection   #1  Region: Lumbar Level: L4 Paravertebral Laterality: Right-Sided Paravertebral   Type: Moderate (Conscious) Sedation combined with Local Anesthesia Indication(s): Analgesia and Anxiety Route: Intravenous (IV) IV Access: Secured Sedation: Meaningful verbal contact was maintained at all times during the procedure  Local Anesthetic: Lidocaine 1-2%  Position: Prone  Procedure #2:    Type: Diagnostic Inter-Laminar Epidural Steroid Injection   #1  Region: Lumbar Level: L3-4 Level. Laterality: Right-Sided Paramedial     Indications: 1. Chronic lower extremity pain (Primary Area of Pain) (Bilateral) (R>L)   2. DDD (degenerative disc disease), lumbar   3. 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 (year old), female patient, seen today for interventional treatment. She  has a past surgical history that includes Cervical biopsy w/ loop electrode excision; Tubal ligation; Breast biopsy (Right, 01/05/2018); and OTHER SURGICAL HISTORY. Monique [name] has a current medication list which includes the following prescription(s): anoro ellipta, gnp calcium 1200, vitamin d3, ergocalciferol, escitalopram, gabapentin, hydrocodone-acetaminophen, magnesium, meloxicam, tizanidine,  tramadol, varenicline, and prochlorperazine, and the following Facility-Administered Medications: fentanyl, lactated ringers, and midazolam. Her primarily concern today is the Back Pain (low)  Initial Vital Signs:  Pulse/HCG Rate: 73  Temp: 98 F (36.7 C) Resp: 18 BP: (!) 117/58 SpO2: 100 %  BMI: Estimated body mass index is 23.49 kg/m as calculated from the following:   Height as of this encounter: 5\' 7"  (1.702 m).   Weight as of this encounter: 150 lb (68 kg).  Risk Assessment: Allergies: Reviewed. She has No Known Allergies.  Allergy Precautions: None required Coagulopathies: Reviewed. None identified.  Blood-thinner therapy: None at this time Active Infection(s): Reviewed. None identified. Monique [name] is afebrile  Site Confirmation: Monique [name] was asked to confirm the procedure and laterality before marking the site Procedure checklist: Completed Consent: Before the procedure and under the influence of no sedative(s), amnesic(s), or anxiolytics, the patient was informed of the treatment options, risks and possible complications. To fulfill our ethical and legal obligations, as recommended by the American Medical Association's Code of Ethics, I have informed the patient of my clinical impression; the nature and purpose of the treatment or procedure; the risks, benefits, and possible complications of the intervention; the alternatives, including doing nothing; the risk(s) and benefit(s) of the alternative treatment(s) or procedure(s); and the risk(s) and benefit(s) of doing nothing. The patient was provided information about the general risks and possible complications associated with the procedure. These may include, but are not limited to: failure to achieve desired goals, infection, bleeding, organ or nerve damage, allergic reactions, paralysis, and death. In addition, the patient was informed of those risks and complications associated to Spine-related procedures, such as failure to  decrease pain; infection (i.e.: Meningitis, epidural or intraspinal abscess); bleeding (i.e.: epidural hematoma, subarachnoid hemorrhage, or any other type of intraspinal or peri-dural bleeding); organ or nerve  damage (i.e.: Any type of peripheral nerve, nerve root, or spinal cord injury) with subsequent damage to sensory, motor, and/or autonomic systems, resulting in permanent pain, numbness, and/or weakness of one or several areas of the body; allergic reactions; (i.e.: anaphylactic reaction); and/or death. Furthermore, the patient was informed of those risks and complications associated with the medications. These include, but are not limited to: allergic reactions (i.e.: anaphylactic or anaphylactoid reaction(s)); adrenal axis suppression; blood sugar elevation that in diabetics may result in ketoacidosis or comma; water retention that in patients with history of congestive heart failure may result in shortness of breath, pulmonary edema, and decompensation with resultant heart failure; weight gain; swelling or edema; medication-induced neural toxicity; particulate matter embolism and blood vessel occlusion with resultant organ, and/or nervous system infarction; and/or aseptic necrosis of one or more joints. Finally, the patient was informed that Medicine is not an exact science; therefore, there is also the possibility of unforeseen or unpredictable risks and/or possible complications that may result in a catastrophic outcome. The patient indicated having understood very clearly. We have given the patient no guarantees and we have made no promises. Enough time was given to the patient to ask questions, all of which were answered to the patient's satisfaction. Monique [name] has indicated that she wanted to continue with the procedure. Attestation: I, the ordering provider, attest that I have discussed with the patient the benefits, risks, side-effects, alternatives, likelihood of achieving goals, and potential  problems during recovery for the procedure that I have provided informed consent. Date  Time: 06/11/2018  8:07 AM  Pre-Procedure Preparation:  Monitoring: As per clinic protocol. Respiration, ETCO2, SpO2, BP, heart rate and rhythm monitor placed and checked for adequate function Safety Precautions: Patient was assessed for positional comfort and pressure points before starting the procedure. Time-out: I initiated and conducted the "Time-out" before starting the procedure, as per protocol. The patient was asked to participate by confirming the accuracy of the "Time Out" information. Verification of the correct person, site, and procedure were performed and confirmed by me, the nursing staff, and the patient. "Time-out" conducted as per Joint Commission's Universal Protocol (UP.01.01.01). Time: 9518  Description of Procedure #1:  Target Area: The inferior and lateral portion of the pedicle, just lateral to a line created by the 6:00 position of the pedicle and the superior articular process of the vertebral body below. On the lateral view, this target lies just posterior to the anterior aspect of the lamina and posterior to the midpoint created between the anterior and the posterior aspect of the neural foramina. Approach: Posterior paravertebral approach. Area Prepped: Entire Posterior Lumbosacral Region Prepping solution: ChloraPrep (2% chlorhexidine gluconate and 70% isopropyl alcohol) Safety Precautions: Aspiration looking for blood return was conducted prior to all injections. At no point did we inject any substances, as a needle was being advanced. No attempts were made at seeking any paresthesias. Safe injection practices and needle disposal techniques used. Medications properly checked for expiration dates. SDV (single dose vial) medications used.  Description of the Procedure: Protocol guidelines were followed. The patient was placed in position over the fluoroscopy table. The target area was  identified and the area prepped in the usual manner. Skin & deeper tissues infiltrated with local anesthetic. Appropriate amount of time allowed to pass for local anesthetics to take effect. The procedure needles were then advanced to the target area. Proper needle placement secured. Negative aspiration confirmed. Solution injected in intermittent fashion, asking for systemic symptoms every 0.2cc of injectate.  The needles were then removed and the area cleansed, making sure to leave some of the prepping solution back to take advantage of its long term bactericidal properties.  Start Time: 0837 hrs.  Materials:  Needle(s) Type: Spinal Needle Gauge: 22G Length: 3.5-in Medication(s): Please see orders for medications and dosing details.  Description of Procedure #2:  Target Area: The  interlaminar space, initially targeting the lower border of the superior vertebral body lamina. Approach: Posterior paramedial approach. Area Prepped: Same as above Prepping solution: Same as above Safety Precautions: Same as above  Description of the Procedure: Protocol guidelines were followed. The patient was placed in position over the fluoroscopy table. The target area was identified and the area prepped in the usual manner. Skin & deeper tissues infiltrated with local anesthetic. Appropriate amount of time allowed to pass for local anesthetics to take effect. The procedure needle was introduced through the skin, ipsilateral to the reported pain, and advanced to the target area. Bone was contacted and the needle walked caudad, until the lamina was cleared. The ligamentum flavum was engaged and loss-of-resistance technique used as the epidural needle was advanced. The epidural space was identified using "loss-of-resistance technique" with 2-3 ml of PF-NaCl (0.9% NSS), in a 5cc LOR glass syringe. Proper needle placement secured. Negative aspiration confirmed. Solution injected in intermittent fashion, asking for  systemic symptoms every 0.5cc of injectate. The needles were then removed and the area cleansed, making sure to leave some of the prepping solution back to take advantage of its long term bactericidal properties.  Vitals:   06/11/18 0850 06/11/18 0855 06/11/18 0905 06/11/18 0915  BP: 96/62 (!) 106/51 106/64 104/63  Pulse: 80 66 73 67  Resp: 16 20 16 18   Temp:  98.6 F (37 C)  98.6 F (37 C)  SpO2: 98% 100% 100% 100%  Weight:      Height:        End Time: 0849 hrs.  Materials:  Needle(s) Type: Epidural needle Gauge: 17G Length: 3.5-in Medication(s): Please see orders for medications and dosing details.  Imaging Guidance (Spinal):          Type of Imaging Technique: Fluoroscopy Guidance (Spinal) Indication(s): Assistance in needle guidance and placement for procedures requiring needle placement in or near specific anatomical locations not easily accessible without such assistance. Exposure Time: Please see nurses notes. Contrast: Before injecting any contrast, we confirmed that the patient did not have an allergy to iodine, shellfish, or radiological contrast. Once satisfactory needle placement was completed at the desired level, radiological contrast was injected. Contrast injected under live fluoroscopy. No contrast complications. See chart for type and volume of contrast used. Fluoroscopic Guidance: I was personally present during the use of fluoroscopy. "Tunnel Vision Technique" used to obtain the best possible view of the target area. Parallax error corrected before commencing the procedure. "Direction-depth-direction" technique used to introduce the needle under continuous pulsed fluoroscopy. Once target was reached, antero-posterior, oblique, and lateral fluoroscopic projection used confirm needle placement in all planes. Images permanently stored in EMR. Interpretation: I personally interpreted the imaging intraoperatively. Adequate needle placement confirmed in multiple planes.  Appropriate spread of contrast into desired area was observed. No evidence of afferent or efferent intravascular uptake. No intrathecal or subarachnoid spread observed. Permanent images saved into the patient's record.  Antibiotic Prophylaxis:   Anti-infectives (From admission, onward)   None     Indication(s): None identified  Post-operative Assessment:  Post-procedure Vital Signs:  Pulse/HCG Rate: 67  Temp: 98.6 F (37  C) Resp: 18 BP: 104/63 SpO2: 100 %  EBL: None  Complications: No immediate post-treatment complications observed by team, or reported by patient.  Note: The patient tolerated the entire procedure well. A repeat set of vitals were taken after the procedure and the patient was kept under observation following institutional policy, for this type of procedure. Post-procedural neurological assessment was performed, showing return to baseline, prior to discharge. The patient was provided with post-procedure discharge instructions, including a section on how to identify potential problems. Should any problems arise concerning this procedure, the patient was given instructions to immediately contact us, at any time, without hesitation. In any case, we plan to contact the patient by telephone for a follow-up status report regarding this interventional procedure.  Comments:  No additional relevant information.  Plan of Care    Imaging Orders     DG C-Arm 1-60 Min-No Report  Procedure Orders     Lumbar Epidural Injection     Lumbar Transforaminal Epidural  Medications ordered for procedure: Meds ordered this encounter  Medications  . iopamidol (ISOVUE-M) 41 % intrathecal injection 10 mL    Must be Myelogram-compatible. If not available, you may substitute with a water-soluble, non-ionic, hypoallergenic, myelogram-compatible radiological contrast medium.  Marland Kitchen lidocaine (XYLOCAINE) 2 % (with pres) injection 400 mg  . midazolam (VERSED) 5 MG/5ML injection 1-2 mg    Make  sure Flumazenil is available in the pyxis when using this medication. If oversedation occurs, administer 0.2 mg IV over 15 sec. If after 45 sec no response, administer 0.2 mg again over 1 min; may repeat at 1 min intervals; not to exceed 4 doses (1 mg)  . fentaNYL (SUBLIMAZE) injection 25-50 mcg    Make sure Narcan is available in the pyxis when using this medication. In the event of respiratory depression (RR< 8/min): Titrate NARCAN (naloxone) in increments of 0.1 to 0.2 mg IV at 2-3 minute intervals, until desired degree of reversal.  . lactated ringers infusion 1,000 mL  . sodium chloride flush (NS) 0.9 % injection 2 mL  . ropivacaine (PF) 2 mg/mL (0.2%) (NAROPIN) injection 2 mL  . triamcinolone acetonide (KENALOG-40) injection 40 mg  . sodium chloride flush (NS) 0.9 % injection 1 mL  . ropivacaine (PF) 2 mg/mL (0.2%) (NAROPIN) injection 1 mL  . dexamethasone (DECADRON) injection 10 mg   Medications administered: We administered iopamidol, lidocaine, midazolam, fentaNYL, lactated ringers, sodium chloride flush, ropivacaine (PF) 2 mg/mL (0.2%), triamcinolone acetonide, sodium chloride flush, ropivacaine (PF) 2 mg/mL (0.2%), and dexamethasone.  See the medical record for exact dosing, route, and time of administration.  New Prescriptions   No medications on file   Disposition: Discharge home  Discharge Date & Time: 06/11/2018; 0920 hrs.   Physician-requested Follow-up: Return for post-procedure eval (2 wks), w/ Dr. Dossie Arbour.  Future Appointments  Date Time Provider [place]  07/01/2018  9:15 AM [name], MD [facility] None  08/06/2018 10:00 AM [facility]  08/11/2018  2:30 PM [facility] None  08/11/2018  2:45 PM Finnegan, Kathlene November, MD [facility] None   Primary Care Physician: [name], MD Location: [facility] Note by: Gaspar Cola, MD Date: 06/11/2018; Time: 9:26 AM  Disclaimer:  Medicine is not  an exact science. The only guarantee in medicine is that nothing is guaranteed. It is important to note that the decision to proceed with this intervention was based on the information collected from the patient. The Data and conclusions were drawn from  the patient's questionnaire, the interview, and the physical examination. Because the information was provided in large part by the patient, it cannot be guaranteed that it has not been purposely or unconsciously manipulated. Every effort has been made to obtain as much relevant data as possible for this evaluation. It is important to note that the conclusions that lead to this procedure are derived in large part from the available data. Always take into account that the treatment will also be dependent on availability of resources and existing treatment guidelines, considered by other Pain Management Practitioners as being common knowledge and practice, at the time of the intervention. For Medico-Legal purposes, it is also important to point out that variation in procedural techniques and pharmacological choices are the acceptable norm. The indications, contraindications, technique, and results of the above procedure should only be interpreted and judged by a Board-Certified Interventional Pain Specialist with extensive familiarity and expertise in the same exact procedure and technique.

## 2018-06-11 NOTE — Patient Instructions (Signed)

## 2018-06-12 ENCOUNTER — Telehealth: Payer: Self-pay

## 2018-06-12 NOTE — Telephone Encounter (Signed)
Post procedue phone call.  LM

## 2018-06-23 ENCOUNTER — Other Ambulatory Visit: Payer: Self-pay | Admitting: Pain Medicine

## 2018-06-23 DIAGNOSIS — E559 Vitamin D deficiency, unspecified: Secondary | ICD-10-CM

## 2018-06-27 ENCOUNTER — Other Ambulatory Visit: Payer: Self-pay | Admitting: Family Medicine

## 2018-06-27 ENCOUNTER — Other Ambulatory Visit: Payer: Self-pay | Admitting: Pain Medicine

## 2018-06-27 DIAGNOSIS — G8929 Other chronic pain: Secondary | ICD-10-CM

## 2018-06-27 DIAGNOSIS — J439 Emphysema, unspecified: Secondary | ICD-10-CM

## 2018-06-27 DIAGNOSIS — M5441 Lumbago with sciatica, right side: Principal | ICD-10-CM

## 2018-06-27 DIAGNOSIS — M5442 Lumbago with sciatica, left side: Principal | ICD-10-CM

## 2018-06-28 ENCOUNTER — Other Ambulatory Visit: Payer: Self-pay | Admitting: Pain Medicine

## 2018-06-28 DIAGNOSIS — M47816 Spondylosis without myelopathy or radiculopathy, lumbar region: Secondary | ICD-10-CM

## 2018-06-29 NOTE — Progress Notes (Signed)
Patient's Name: Monique [name]  MRN: [id number]  Referring Provider: [name], MD  DOB: Dec 28, 1970  PCP: [name], MD  DOS: 07/01/2018  Note by: Gaspar Cola, MD  Service setting: Ambulatory outpatient  Specialty: Interventional Pain Management  Location: [facility] (AMB) Pain Management Facility    Patient type: Established   Primary Reason(s) for Visit: Encounter for post-procedure evaluation of chronic illness with mild to moderate exacerbation CC: Back Pain  HPI  Monique [name] is a 47 y.o. year old, female patient, who comes today for a post-procedure evaluation. She has B12 deficiency; Insomnia, persistent; Mild episode of recurrent major depressive disorder ([place]); Anorexia nervosa, restricting type; Anxiety, generalized; H/O suicide attempt; Lymphocytosis; Obsessive-compulsive disorder; Tobacco use; History of cervical dysplasia; Stress incontinence; CLL (chronic lymphocytic leukemia) ([place]); Pap smear abnormality of cervix/human papillomavirus (HPV) positive; GERD without esophagitis; Adductor tendinitis; Trochanteric bursitis of right hip; Thoracic aortic atherosclerosis ([place]); Emphysema of lung ([place]); Breast mass, right; Mastalgia; Facial pain; Tingling; Chronic lower extremity pain (Primary Area of Pain) (Bilateral) (R>L); Chronic low back pain (Secondary Area of Pain) (Bilateral) (R>L) w/ sciatica (Bilateral); Chronic pain syndrome; Opiate use; Disorder of skeletal system; Pharmacologic therapy; Problems influencing health status; Marijuana use; DDD (degenerative disc disease), lumbar; Lumbar facet hypertrophy; Lumbar facet arthropathy; Lumbar facet syndrome (Bilateral) (R>L); Hypocalcemia; Vitamin D insufficiency; Abnormal MRI, lumbar spine (04/20/2017); Chronic hip pain (Right); and Spondylosis without myelopathy or radiculopathy, lumbar region on their problem list. Her primarily concern today is the Back Pain  Pain Assessment: Location: Lower Back Radiating: denies Onset: More  than a month ago Duration: Chronic pain Quality: Aching, Burning, Tingling Severity: 2 /10 (subjective, self-reported pain score)  Note: Reported level is compatible with observation.                         When using our objective Pain Scale, levels between 6 and 10/10 are said to belong in an emergency room, as it progressively worsens from a 6/10, described as severely limiting, requiring emergency care not usually available at an outpatient pain management facility. At a 6/10 level, communication becomes difficult and requires great effort. Assistance to reach the emergency department may be required. Facial flushing and profuse sweating along with potentially dangerous increases in heart rate and blood pressure will be evident. Effect on ADL: limits my daily activites Timing: Intermittent Modifying factors: rest and gabapentin BP: (!) 97/54  HR: 83  Monique [name] comes in today for post-procedure evaluation.  Further details on both, my assessment(s), as well as the proposed treatment plan, please see below.  Post-Procedure Assessment  06/28/2018 Procedure: Diagnostic right-sided L4 transforaminal ESI #1+ diagnostic right-sided L3-4 LESI #1 under fluoroscopic guidance and IV sedation Pre-procedure pain score:  2/10 Post-procedure pain score: 0/10 (100% relief) Influential Factors: BMI: 23.49 kg/m Intra-procedural challenges: None observed.         Assessment challenges: None detected.              Reported side-effects: None.        Post-procedural adverse reactions or complications: None reported         Sedation: Sedation provided. When no sedatives are used, the analgesic levels obtained are directly associated to the effectiveness of the local anesthetics. However, when sedation is provided, the level of analgesia obtained during the initial 1 hour following the intervention, is believed to be the result of a combination of factors. These factors may include, but are not limited  to: 1.  The effectiveness of the local anesthetics used. 2. The effects of the analgesic(s) and/or anxiolytic(s) used. 3. The degree of discomfort experienced by the patient at the time of the procedure. 4. The patients ability and reliability in recalling and recording the events. 5. The presence and influence of possible secondary [name] and/or psychosocial factors. Reported result: Relief experienced during the 1st hour after the procedure: 100 % (Ultra-Short Term Relief)            Interpretative annotation: Clinically appropriate result. Analgesia during this period is likely to be Local Anesthetic and/or IV Sedative (Analgesic/Anxiolytic) related.          Effects of local anesthetic: The analgesic effects attained during this period are directly associated to the localized infiltration of local anesthetics and therefore cary significant diagnostic value as to the etiological location, or anatomical origin, of the pain. Expected duration of relief is directly dependent on the pharmacodynamics of the local anesthetic used. Long-acting (4-6 hours) anesthetics used.  Reported result: Relief during the next 4 to 6 hour after the procedure: 100 %(100 for 5 to 6 days) (Short-Term Relief)            Interpretative annotation: Clinically appropriate result. Analgesia during this period is likely to be Local Anesthetic-related.          Long-term benefit: Defined as the period of time past the expected duration of local anesthetics (1 hour for short-acting and 4-6 hours for long-acting). With the possible exception of prolonged sympathetic blockade from the local anesthetics, benefits during this period are typically attributed to, or associated with, other factors such as analgesic sensory neuropraxia, antiinflammatory effects, or beneficial biochemical changes provided by agents other than the local anesthetics.  Reported result: Extended relief following procedure: 80 % (Long-Term Relief)             Interpretative annotation: Clinically possible results. Good relief. No permanent benefit expected. Inflammation plays a part in the etiology to the pain. Benefit believed to be steroid-related.  Current benefits: Defined as reported results that persistent at this point in time.   Analgesia: 75-100 %            Function: Monique [name] reports improvement in function ROM: Ms. Bartl reports improvement in ROM Interpretative annotation: Ongoing benefit. Therapeutic success. Effective therapeutic approach. Benefit could be steroid-related.  Interpretation: Results would suggest a successful diagnostic intervention.                  Plan:  Set up procedure as a PRN palliative treatment option for this patient.                Laboratory Chemistry  Inflammation Markers (CRP: Acute Phase) (ESR: Chronic Phase) Lab Results  Component Value Date   CRP <1 05/13/2018   ESRSEDRATE 4 05/13/2018                         Renal Markers Lab Results  Component Value Date   BUN 15 05/13/2018   CREATININE 0.69 05/13/2018   BCR 22 05/13/2018   GFRAA 119 05/13/2018   GFRNONAA 103 05/13/2018                             Hepatic Markers Lab Results  Component Value Date   AST 17 05/13/2018   ALT 14 05/07/2018   ALBUMIN 4.1 05/13/2018   HCVAB <0.1 01/22/2017  Neuropathy Markers Lab Results  Component Value Date   VITAMINB12 303 05/13/2018   HGBA1C 5.3 09/12/2016                        Hematology Parameters Lab Results  Component Value Date   PLT 173 05/07/2018   HGB 13.2 05/07/2018   HCT 38.9 05/07/2018                        CV Markers Lab Results  Component Value Date   CKTOTAL 44 12/04/2017   CKMB <0.7 12/04/2017   TROPONINI <0.01 12/04/2017                         Note: Lab results reviewed.  Recent Imaging Results   Results for orders placed in visit on 06/11/18  DG C-Arm 1-60 Min-No Report   Narrative Fluoroscopy was utilized by the requesting  physician.  No radiographic  interpretation.    Interpretation Report: Fluoroscopy was used during the procedure to assist with needle guidance. The images were interpreted intraoperatively by the requesting physician.  Meds   Current Outpatient Medications:  .  ANORO ELLIPTA 62.5-25 MCG/INH AEPB, INHALE 1 PUFF INTO THE LUNGS EVERY DAY, Disp: 60 each, Rfl: 0 .  Calcium Carbonate-Vit D-Min (GNP CALCIUM 1200) 1200-1000 MG-UNIT CHEW, Chew 1,200 mg by mouth daily with breakfast. Take in combination with vitamin D and magnesium., Disp: 30 tablet, Rfl: 5 .  Cholecalciferol (VITAMIN D3) 5000 units CAPS, Take 1 capsule (5,000 Units total) by mouth daily with breakfast. Take along with calcium and magnesium., Disp: 30 capsule, Rfl: 5 .  ergocalciferol (VITAMIN D2) 50000 units capsule, Take 1 capsule (50,000 Units total) by mouth 2 (two) times a week. X 6 weeks., Disp: 12 capsule, Rfl: 0 .  escitalopram (LEXAPRO) 10 MG tablet, as needed. , Disp: , Rfl:  .  gabapentin (NEURONTIN) 100 MG capsule, Take 100 mg twice a day for one week, then increase to 200 mg(2 tablets) twice a day and continue, Disp: , Rfl:  .  HYDROcodone-acetaminophen (NORCO/VICODIN) 5-325 MG tablet, every 6 (six) hours as needed. , Disp: , Rfl:  .  Magnesium 500 MG CAPS, Take 1 capsule (500 mg total) by mouth 2 (two) times daily at 8 am and 10 pm., Disp: 60 capsule, Rfl: 5 .  meloxicam (MOBIC) 15 MG tablet, Take 1 tablet (15 mg total) by mouth daily., Disp: 180 tablet, Rfl: 0 .  prochlorperazine (COMPAZINE) 10 MG tablet, Take 1 tablet (10 mg total) by mouth every 6 (six) hours as needed (Nausea or vomiting)., Disp: 60 tablet, Rfl: 2 .  tiZANidine (ZANAFLEX) 2 MG tablet, Take 1 tablet (2 mg total) by mouth every 8 (eight) hours as needed for muscle spasms., Disp: 90 tablet, Rfl: 5 .  traMADol (ULTRAM) 50 MG tablet, Take by mouth every 6 (six) hours as needed., Disp: , Rfl:  .  varenicline (CHANTIX STARTING MONTH PAK) 0.5 MG X 11 & 1 MG X 42  tablet, Take one 0.5 mg tablet by mouth once daily for 3 days, then increase to one 0.5 mg tablet twice daily for 4 days, then increase to one 1 mg tablet twice daily., Disp: 53 tablet, Rfl: 0  ROS  Constitutional: Denies any fever or chills Gastrointestinal: No reported hemesis, hematochezia, vomiting, or acute GI distress Musculoskeletal: Denies any acute onset joint swelling, redness, loss of ROM, or weakness Neurological: No reported  episodes of acute onset apraxia, aphasia, dysarthria, agnosia, amnesia, paralysis, loss of coordination, or loss of consciousness  Allergies  Monique [name] has No Known Allergies.  PFSH  Drug: Monique [name]  reports that she has current or past drug history. Drug: Marijuana. Alcohol:  reports that she drinks alcohol. Tobacco:  reports that she has been smoking. She started smoking about 31 years ago. She has a 31.00 pack-year smoking history. She has never used smokeless tobacco. Medical:  has a past medical history of Anxiety, Bursitis, Cervical cancer ([place]), Cervical intraepithelial neoplasia I, Chronic lymphocytic leukemia ([place]) (2018), Depression, Eating disorder, History of self-harm, Insomnia, Obsession, Pap smear abnormality of cervix with LGSIL, Tobacco abuse, and Vitamin B12 deficiency (non anemic). Surgical: Monique [name]  has a past surgical history that includes Cervical biopsy w/ loop electrode excision; Tubal ligation; Breast biopsy (Right, 01/05/2018); and OTHER SURGICAL HISTORY. Family: family history includes ADD / ADHD in her son; Alcohol abuse in her brother and father; Bipolar disorder in her brother; Cancer in her mother and sister; Depression in her brother and sister.  Constitutional Exam  General appearance: Well nourished, well developed, and well hydrated. In no apparent acute distress Vitals:   07/01/18 0914  BP: (!) 97/54  Pulse: 83  Temp: 97.8 F (36.6 C)  SpO2: 100%  Weight: 150 lb (68 kg)  Height: _0  (1.702 m)   BMI Assessment:  Estimated body mass index is 23.49 kg/m as calculated from the following:   Height as of this encounter: _1  (1.702 m).   Weight as of this encounter: 150 lb (68 kg).  BMI interpretation table: BMI level Category Range association with higher incidence of chronic pain  <18 kg/m2 Underweight   18.5-24.9 kg/m2 Ideal body weight   25-29.9 kg/m2 Overweight Increased incidence by 20%  30-34.9 kg/m2 Obese (Class I) Increased incidence by 68%  35-39.9 kg/m2 Severe obesity (Class II) Increased incidence by 136%  >40 kg/m2 Extreme obesity (Class III) Increased incidence by 254%   Patient's current BMI Ideal Body weight  Body mass index is 23.49 kg/m. Ideal body weight: 61.6 kg (135 lb 12.9 oz) Adjusted ideal body weight: 64.2 kg (141 lb 7.7 oz)   BMI Readings from Last 4 Encounters:  07/01/18 23.49 kg/m  06/11/18 23.49 kg/m  06/03/18 23.49 kg/m  05/13/18 23.49 kg/m   Wt Readings from Last 4 Encounters:  07/01/18 150 lb (68 kg)  06/11/18 150 lb (68 kg)  06/03/18 150 lb (68 kg)  05/13/18 150 lb (68 kg)  Psych/Mental status: Alert, oriented x 3 (person, place, & time)       Eyes: PERLA Respiratory: No evidence of acute respiratory distress  Cervical Spine Area Exam  Skin & Axial Inspection: No masses, redness, edema, swelling, or associated skin lesions Alignment: Symmetrical Functional ROM: Unrestricted ROM      Stability: No instability detected Muscle Tone/Strength: Functionally intact. No obvious neuro-muscular anomalies detected. Sensory (Neurological): Unimpaired Palpation: No palpable anomalies              Upper Extremity (UE) Exam    Side: Right upper extremity  Side: Left upper extremity  Skin & Extremity Inspection: Skin color, temperature, and hair growth are WNL. No peripheral edema or cyanosis. No masses, redness, swelling, asymmetry, or associated skin lesions. No contractures.  Skin & Extremity Inspection: Skin color, temperature, and hair growth are WNL. No  peripheral edema or cyanosis. No masses, redness, swelling, asymmetry, or associated skin lesions. No contractures.  Functional ROM: Unrestricted  ROM          Functional ROM: Unrestricted ROM          Muscle Tone/Strength: Functionally intact. No obvious neuro-muscular anomalies detected.  Muscle Tone/Strength: Functionally intact. No obvious neuro-muscular anomalies detected.  Sensory (Neurological): Unimpaired          Sensory (Neurological): Unimpaired          Palpation: No palpable anomalies              Palpation: No palpable anomalies              Provocative Test(s):  Phalen's test: deferred Tinel's test: deferred Apley's scratch test (touch opposite shoulder):  Action 1 (Across chest): deferred Action 2 (Overhead): deferred Action 3 (LB reach): deferred   Provocative Test(s):  Phalen's test: deferred Tinel's test: deferred Apley's scratch test (touch opposite shoulder):  Action 1 (Across chest): deferred Action 2 (Overhead): deferred Action 3 (LB reach): deferred    Thoracic Spine Area Exam  Skin & Axial Inspection: No masses, redness, or swelling Alignment: Symmetrical Functional ROM: Unrestricted ROM Stability: No instability detected Muscle Tone/Strength: Functionally intact. No obvious neuro-muscular anomalies detected. Sensory (Neurological): Unimpaired Muscle strength & Tone: No palpable anomalies  Lumbar Spine Area Exam  Skin & Axial Inspection: No masses, redness, or swelling Alignment: Symmetrical Functional ROM: Improved after treatment       Stability: No instability detected Muscle Tone/Strength: Functionally intact. No obvious neuro-muscular anomalies detected. Sensory (Neurological): Unimpaired Palpation: No palpable anomalies       Provocative Tests: Hyperextension/rotation test: Improved after treatment       Lumbar quadrant test (Kemp's test): deferred today       Lateral bending test: deferred today       Patrick's Maneuver: deferred today                    FABER test: deferred today                   S-I anterior distraction/compression test: deferred today         S-I lateral compression test: deferred today         S-I Thigh-thrust test: deferred today         S-I Gaenslen's test: deferred today          Gait & Posture Assessment  Ambulation: Unassisted Gait: Relatively normal for age and body habitus Posture: WNL   Lower Extremity Exam    Side: Right lower extremity  Side: Left lower extremity  Stability: No instability observed          Stability: No instability observed          Skin & Extremity Inspection: Skin color, temperature, and hair growth are WNL. No peripheral edema or cyanosis. No masses, redness, swelling, asymmetry, or associated skin lesions. No contractures.  Skin & Extremity Inspection: Skin color, temperature, and hair growth are WNL. No peripheral edema or cyanosis. No masses, redness, swelling, asymmetry, or associated skin lesions. No contractures.  Functional ROM: Unrestricted ROM                  Functional ROM: Unrestricted ROM                  Muscle Tone/Strength: Functionally intact. No obvious neuro-muscular anomalies detected.  Muscle Tone/Strength: Functionally intact. No obvious neuro-muscular anomalies detected.  Sensory (Neurological): Unimpaired  Sensory (Neurological): Unimpaired  Palpation: No palpable anomalies  Palpation: No palpable anomalies  Assessment  Primary Diagnosis & Pertinent Problem List: The primary encounter diagnosis was Chronic lower extremity pain (Primary Area of Pain) (Bilateral) (R>L). Diagnoses of Chronic low back pain (Secondary Area of Pain) (Bilateral) (R>L) w/ sciatica (Bilateral), DDD (degenerative disc disease), lumbar, Lumbar facet syndrome (Bilateral) (R>L), and Lumbar facet arthropathy were also pertinent to this visit.  Status Diagnosis  Improved Resolved Stable 1. Chronic lower extremity pain (Primary Area of Pain) (Bilateral) (R>L)   2. Chronic low back  pain (Secondary Area of Pain) (Bilateral) (R>L) w/ sciatica (Bilateral)   3. DDD (degenerative disc disease), lumbar   4. Lumbar facet syndrome (Bilateral) (R>L)   5. Lumbar facet arthropathy     Problems updated and reviewed during this visit: No problems updated. Plan of Care  Pharmacotherapy (Medications Ordered): Meds ordered this encounter  Medications  . meloxicam (MOBIC) 15 MG tablet    Sig: Take 1 tablet (15 mg total) by mouth daily.    Dispense:  180 tablet    Refill:  0    Do not add this medication to the electronic "Automatic Refill" notification system. Patient may have prescription filled one day early if pharmacy is closed on scheduled refill date.  Marland Kitchen tiZANidine (ZANAFLEX) 2 MG tablet    Sig: Take 1 tablet (2 mg total) by mouth every 8 (eight) hours as needed for muscle spasms.    Dispense:  90 tablet    Refill:  5    Do not place this medication, or any other prescription from our practice, on "Automatic Refill". Patient may have prescription filled one day early if pharmacy is closed on scheduled refill date.   Medications administered today: Sonita L. Prisk had no medications administered during this visit.   Procedure Orders     Lumbar Epidural Injection     Lumbar Transforaminal Epidural Lab Orders  No laboratory test(s) ordered today   Imaging Orders  No imaging studies ordered today   Referral Orders  No referral(s) requested today   Interventional management options: Planned, scheduled, and/or pending:   None at this time.   Considering:   Diagnostic/therapeutic right-sided L3-4 LESI #2   Diagnostic/therapeutic right-sided L4 TFESI #2  Diagnostic bilateral lumbar facet nerve block #1  Possible bilateral lumbar facet radiofrequency ablation    Palliative PRN treatment(s):   Palliative right-sided L4 transforaminal ESI + right-sided L3-4 LESI under fluoroscopic guidance and IV sedation   Provider-requested follow-up: Return in about 6 months  (around 12/31/2018) for PRN Procedure: (R) L4 TFESI #2 + (R) L3-4 LESI # 2 (w/ sedation), Med-Mgmt, w/ Dionisio David, NP.  Future Appointments  Date Time Provider [place]  07/16/2018  9:45 AM [facility] None  07/16/2018 10:00 AM Lloyd Huger, MD [facility] None  08/06/2018 10:00 AM [facility]  12/31/2018  9:45 AM Vevelyn Francois, NP [facility] None   Primary Care Physician: [name], MD Location: [facility] Note by: Gaspar Cola, MD Date: 07/01/2018; Time: 10:49 AM

## 2018-07-01 ENCOUNTER — Other Ambulatory Visit: Payer: Self-pay

## 2018-07-01 ENCOUNTER — Encounter: Payer: Self-pay | Admitting: Pain Medicine

## 2018-07-01 ENCOUNTER — Ambulatory Visit: Payer: BLUE CROSS/BLUE SHIELD | Attending: Pain Medicine | Admitting: Pain Medicine

## 2018-07-01 VITALS — BP 97/54 | HR 83 | Temp 97.8°F | Ht 67.0 in | Wt 150.0 lb

## 2018-07-01 DIAGNOSIS — G4700 Insomnia, unspecified: Secondary | ICD-10-CM | POA: Insufficient documentation

## 2018-07-01 DIAGNOSIS — M47816 Spondylosis without myelopathy or radiculopathy, lumbar region: Secondary | ICD-10-CM | POA: Diagnosis not present

## 2018-07-01 DIAGNOSIS — K219 Gastro-esophageal reflux disease without esophagitis: Secondary | ICD-10-CM | POA: Diagnosis not present

## 2018-07-01 DIAGNOSIS — Z79891 Long term (current) use of opiate analgesic: Secondary | ICD-10-CM | POA: Diagnosis not present

## 2018-07-01 DIAGNOSIS — M5442 Lumbago with sciatica, left side: Secondary | ICD-10-CM | POA: Diagnosis not present

## 2018-07-01 DIAGNOSIS — G894 Chronic pain syndrome: Secondary | ICD-10-CM | POA: Diagnosis not present

## 2018-07-01 DIAGNOSIS — M5441 Lumbago with sciatica, right side: Secondary | ICD-10-CM

## 2018-07-01 DIAGNOSIS — M79605 Pain in left leg: Secondary | ICD-10-CM

## 2018-07-01 DIAGNOSIS — Z79899 Other long term (current) drug therapy: Secondary | ICD-10-CM | POA: Diagnosis not present

## 2018-07-01 DIAGNOSIS — F429 Obsessive-compulsive disorder, unspecified: Secondary | ICD-10-CM | POA: Diagnosis not present

## 2018-07-01 DIAGNOSIS — M79604 Pain in right leg: Secondary | ICD-10-CM

## 2018-07-01 DIAGNOSIS — E538 Deficiency of other specified B group vitamins: Secondary | ICD-10-CM | POA: Insufficient documentation

## 2018-07-01 DIAGNOSIS — G8929 Other chronic pain: Secondary | ICD-10-CM

## 2018-07-01 DIAGNOSIS — F411 Generalized anxiety disorder: Secondary | ICD-10-CM | POA: Insufficient documentation

## 2018-07-01 DIAGNOSIS — M5136 Other intervertebral disc degeneration, lumbar region: Secondary | ICD-10-CM | POA: Insufficient documentation

## 2018-07-01 MED ORDER — TIZANIDINE HCL 2 MG PO TABS: 2.0000 mg | ORAL_TABLET | Freq: Three times a day (TID) | ORAL | 5 refills | Status: DC | PRN

## 2018-07-01 MED ORDER — MELOXICAM 15 MG PO TABS: 15.0000 mg | ORAL_TABLET | Freq: Every day | ORAL | 0 refills | Status: DC

## 2018-07-01 NOTE — Patient Instructions (Addendum)
____________________________________________________________________________________________  Preparing for Procedure with Sedation  Instructions: . Oral Intake: Do not eat or drink anything for at least 8 hours prior to your procedure. . Transportation: Public transportation is not allowed. Bring an adult driver. The driver must be physically present in our waiting room before any procedure can be started. . Physical Assistance: Bring an adult physically capable of assisting you, in the event you need help. This adult should keep you company at home for at least 6 hours after the procedure. . Blood Pressure Medicine: Take your blood pressure medicine with a sip of water the morning of the procedure. . Blood thinners: Notify our staff if you are taking any blood thinners. Depending on which one you take, there will be specific instructions on how and when to stop it. . Diabetics on insulin: Notify the staff so that you can be scheduled 1st case in the morning. If your diabetes requires high dose insulin, take only  of your normal insulin dose the morning of the procedure and notify the staff that you have done so. . Preventing infections: Shower with an antibacterial soap the morning of your procedure. . Build-up your immune system: Take 1000 mg of Vitamin C with every meal (3 times a day) the day prior to your procedure. . Antibiotics: Inform the staff if you have a condition or reason that requires you to take antibiotics before dental procedures. . Pregnancy: If you are pregnant, call and cancel the procedure. . Sickness: If you have a cold, fever, or any active infections, call and cancel the procedure. . Arrival: You must be in the facility at least 30 minutes prior to your scheduled procedure. . Children: Do not bring children with you. . Dress appropriately: Bring dark clothing that you would not mind if they get stained. . Valuables: Do not bring any jewelry or valuables.  Procedure  appointments are reserved for interventional treatments only. . No Prescription Refills. . No medication changes will be discussed during procedure appointments. . No disability issues will be discussed.  Reasons to call and reschedule or cancel your procedure: (Following these recommendations will minimize the risk of a serious complication.) . Surgeries: Avoid having procedures within 2 weeks of any surgery. (Avoid for 2 weeks before or after any surgery). . Flu Shots: Avoid having procedures within 2 weeks of a flu shots or . (Avoid for 2 weeks before or after immunizations). . Barium: Avoid having a procedure within 7-10 days after having had a radiological study involving the use of radiological contrast. (Myelograms, Barium swallow or enema study). . Heart attacks: Avoid any elective procedures or surgeries for the initial 6 months after a "Myocardial Infarction" (Heart Attack). . Blood thinners: It is imperative that you stop these medications before procedures. Let us know if you if you take any blood thinner.  . Infection: Avoid procedures during or within two weeks of an infection (including chest colds or gastrointestinal problems). Symptoms associated with infections include: Localized redness, fever, chills, night sweats or profuse sweating, burning sensation when voiding, cough, congestion, stuffiness, runny nose, sore throat, diarrhea, nausea, vomiting, cold or Flu symptoms, recent or current infections. It is specially important if the infection is over the area that we intend to treat. . Heart and lung problems: Symptoms that may suggest an active cardiopulmonary problem include: cough, chest pain, breathing difficulties or shortness of breath, dizziness, ankle swelling, uncontrolled high or unusually low blood pressure, and/or palpitations. If you are experiencing any of these symptoms, cancel   your procedure and contact your primary care physician for an evaluation.  Remember:   Regular Business hours are:  Monday to Thursday 8:00 AM to 4:00 PM  Provider's Schedule: [name], MD:  Procedure days: Tuesday and Thursday 7:30 AM to 4:00 PM  Gillis Santa, MD:  Procedure days: Monday and Wednesday 7:30 AM to 4:00 PM ____________________________________________________________________________________________   ____________________________________________________________________________________________  Muscle Spasms & Cramps  Cause:  The most common cause of muscle spasms and cramps is vitamin and/or electrolyte (calcium, potassium, sodium, etc.) deficiencies.  Possible triggers: Sweating - causes loss of electrolytes thru the skin. Steroids - causes loss of electrolytes thru the urine.  Treatment: 1. Gatorade (or any other electrolyte-replenishing drink) - Take 1, 8 oz glass with each meal (3 times a day). 2. OTC (over-the-counter) Magnesium 400 to 500 mg - Take 1 tablet twice a day (one with breakfast and one before bedtime). If you have kidney problems, talk to your primary care physician before taking any Magnesium. 3. Tonic Water with quinine - Take 1, 8 oz glass before bedtime.   ____________________________________________________________________________________________   Vitamin C (not sustained release) 1000 mg, three times a day w/ food (watch urine color)(yellow=enough) Zinc may help prevent colds

## 2018-07-13 ENCOUNTER — Other Ambulatory Visit: Payer: Self-pay | Admitting: Oncology

## 2018-07-13 ENCOUNTER — Telehealth: Payer: Self-pay | Admitting: *Deleted

## 2018-07-13 DIAGNOSIS — C9110 Chronic lymphocytic leukemia of B-cell type not having achieved remission: Secondary | ICD-10-CM

## 2018-07-13 NOTE — Telephone Encounter (Signed)
Stat CT ordered.  Will see her after imaging completed.

## 2018-07-13 NOTE — Telephone Encounter (Signed)
Can we please schedule patient for scans today or tomorrow, move up follow up appointment from 10/31 if scans are able to be scheduled earlier in the week. Thank you.

## 2018-07-13 NOTE — Telephone Encounter (Signed)
Patient informed of CT being ordered and that we will move her appointment with doctor up as well. She will await to hear from schedulers

## 2018-07-13 NOTE — Telephone Encounter (Signed)
Pcalled and states that she feels she has softball masses under both arms. Has follow up appointment Thursday, asking if she should just wait until then or should she come in to be checked now.Please advise

## 2018-07-13 NOTE — Progress Notes (Deleted)
[facility]  [phone] [phone]) [phone]  ID: [name] OB: Feb 07, 1971  MR#: [id number]  FWY#:[id number]  Patient Care Team: [name], MD as PCP - General (Family Medicine)  CHIEF COMPLAINT: CLL.  INTERVAL HISTORY: Patient returns to clinic today for further evaluation and discussion of her imaging results.  She was recently evaluated by surgery for an enlarging breast mass which was thought to be inflammatory in nature.  She also is being evaluated for UTI.  She otherwise feels well.  She continues to be active and work full-time.  She has no neurologic complaints. She denies any fevers, illnesses, or weight loss. She has no chest pain or shortness of breath. She denies any nausea, vomiting, constipation, or diarrhea.  Patient offers no further specific complaints today.  REVIEW OF SYSTEMS:   Review of Systems  Constitutional: Negative.  Negative for fever, malaise/fatigue and weight loss.  HENT: Negative.  Negative for congestion.   Respiratory: Negative.  Negative for cough and shortness of breath.   Cardiovascular: Negative.  Negative for chest pain and leg swelling.  Gastrointestinal: Negative.  Negative for abdominal pain and constipation.  Genitourinary: Positive for frequency. Negative for dysuria.  Musculoskeletal: Negative.  Negative for myalgias and neck pain.  Skin: Negative.  Negative for rash.  Neurological: Negative.  Negative for tingling, sensory change, focal weakness and weakness.  Psychiatric/Behavioral: The patient is nervous/anxious.     As per HPI. Otherwise, a complete review of systems is negative.  PAST MEDICAL HISTORY: Past Medical History:  Diagnosis Date  . Anxiety   . Bursitis    leg pain  . Cervical cancer ([place])    hx LEEP over 18 years ago.   . Cervical intraepithelial neoplasia I   . Chronic lymphocytic leukemia ([place]) 2018   Dr Grayland Ormond  . Depression   . Eating disorder   . History of self-harm    . Insomnia   . Obsession   . Pap smear abnormality of cervix with LGSIL   . Tobacco abuse   . Vitamin B12 deficiency (non anemic)     PAST SURGICAL HISTORY: Past Surgical History:  Procedure Laterality Date  . BREAST BIOPSY Right 01/05/2018   US guided biopsy of 2 areas and 1 lymph node, MIXED INFLAMMATION AND GIANT CELL REACTION  . CERVICAL BIOPSY  W/ LOOP ELECTRODE EXCISION    . OTHER SURGICAL HISTORY     scar tissue removed from vocal cords  . TUBAL LIGATION      FAMILY HISTORY: Family History  Problem Relation Age of Onset  . Cancer Mother        thyroid  . Alcohol abuse Father   . Depression Sister   . Cancer Sister        cevical  . Alcohol abuse Brother   . Depression Brother   . Bipolar disorder Brother   . ADD / ADHD Son   . Breast cancer Neg Hx     ADVANCED DIRECTIVES (Y/N):  N  HEALTH MAINTENANCE: Social History   Tobacco Use  . Smoking status: Current Every Day Smoker    Packs/day: 1.00    Years: 31.00    Pack years: 31.00    Start date: 09/25/1986  . Smokeless tobacco: Never Used  . Tobacco comment: Would like to discuss Chantix- she forgot to take this medication  Substance Use Topics  . Alcohol use: Yes    Alcohol/week: 0.0 standard drinks    Comment: rarely  . Drug use: Yes  Types: Marijuana     Colonoscopy:  PAP:  Bone density:  Lipid panel:  No Known Allergies  Current Outpatient Medications  Medication Sig Dispense Refill  . ANORO ELLIPTA 62.5-25 MCG/INH AEPB INHALE 1 PUFF INTO THE LUNGS EVERY DAY 60 each 0  . Calcium Carbonate-Vit D-Min (GNP CALCIUM 1200) 1200-1000 MG-UNIT CHEW Chew 1,200 mg by mouth daily with breakfast. Take in combination with vitamin D and magnesium. 30 tablet 5  . Cholecalciferol (VITAMIN D3) 5000 units CAPS Take 1 capsule (5,000 Units total) by mouth daily with breakfast. Take along with calcium and magnesium. 30 capsule 5  . ergocalciferol (VITAMIN D2) 50000 units capsule Take 1 capsule (50,000 Units  total) by mouth 2 (two) times a week. X 6 weeks. 12 capsule 0  . escitalopram (LEXAPRO) 10 MG tablet as needed.     . gabapentin (NEURONTIN) 100 MG capsule Take 100 mg twice a day for one week, then increase to 200 mg(2 tablets) twice a day and continue    . HYDROcodone-acetaminophen (NORCO/VICODIN) 5-325 MG tablet every 6 (six) hours as needed.     . Magnesium 500 MG CAPS Take 1 capsule (500 mg total) by mouth 2 (two) times daily at 8 am and 10 pm. 60 capsule 5  . meloxicam (MOBIC) 15 MG tablet Take 1 tablet (15 mg total) by mouth daily. 180 tablet 0  . prochlorperazine (COMPAZINE) 10 MG tablet Take 1 tablet (10 mg total) by mouth every 6 (six) hours as needed (Nausea or vomiting). 60 tablet 2  . tiZANidine (ZANAFLEX) 2 MG tablet Take 1 tablet (2 mg total) by mouth every 8 (eight) hours as needed for muscle spasms. 90 tablet 5  . traMADol (ULTRAM) 50 MG tablet Take by mouth every 6 (six) hours as needed.    . varenicline (CHANTIX STARTING MONTH PAK) 0.5 MG X 11 & 1 MG X 42 tablet Take one 0.5 mg tablet by mouth once daily for 3 days, then increase to one 0.5 mg tablet twice daily for 4 days, then increase to one 1 mg tablet twice daily. 53 tablet 0   No current facility-administered medications for this visit.     OBJECTIVE: There were no vitals filed for this visit.   There is no height or weight on file to calculate BMI.    ECOG FS:0 - Asymptomatic  General: Well-developed, well-nourished, no acute distress. Eyes: Pink conjunctiva, anicteric sclera. HEENT: Normocephalic, moist mucous membranes, clear oropharnyx.  Lymphadenopathy resolved. Breast: Tender, patient requested exam be deferred.  Recently evaluated by surgery. Lungs: Clear to auscultation bilaterally. Heart: Regular rate and rhythm. No rubs, murmurs, or gallops. Abdomen: Soft, nontender, nondistended. No organomegaly noted, normoactive bowel sounds. Musculoskeletal: No edema, cyanosis, or clubbing. Neuro: Alert, answering all  questions appropriately. Cranial nerves grossly intact. Skin: No rashes or petechiae noted. Psych: Normal affect.  LAB RESULTS:  Lab Results  Component Value Date   NA 138 05/13/2018   K 4.2 05/13/2018   CL 102 05/13/2018   CO2 24 05/07/2018   GLUCOSE 72 05/13/2018   BUN 15 05/13/2018   CREATININE 0.69 05/13/2018   CALCIUM 8.3 (L) 05/13/2018   PROT 6.3 05/13/2018   ALBUMIN 4.1 05/13/2018   AST 17 05/13/2018   ALT 14 05/07/2018   ALKPHOS 56 05/13/2018   BILITOT 0.4 05/13/2018   GFRNONAA 103 05/13/2018   GFRAA 119 05/13/2018    Lab Results  Component Value Date   WBC 5.0 05/07/2018   NEUTROABS 2.1 05/07/2018   HGB  13.2 05/07/2018   HCT 38.9 05/07/2018   MCV 98.8 05/07/2018   PLT 173 05/07/2018     STUDIES: No results found.  ASSESSMENT: CLL, Rai stage 1, breast mass  PLAN:    1. CLL: Confirmed by peripheral blood flow cytometry.  Patient completed cycle 3 of Rituxan plus Treanda on March 26, 2018.  CT scan results reviewed independently and report as above with essentially stable disease.  No intervention is needed at this time.  Return to clinic in 3 months with repeat imaging and further evaluation. 2.  Breast mass: Appreciate surgical input.  Thought to be inflammatory.  Continue NSAIDs as recommended and follow-up with surgery as scheduled. 3.  UTI: Continue monitoring and treatment per primary care. 4.  Leukocytosis: Secondary to CLL, resolved.    Patient expressed understanding and was in agreement with this plan. She also understands that She can call clinic at any time with any questions, concerns, or complaints.    Lloyd Huger, MD   07/13/2018 12:18 AM

## 2018-07-14 ENCOUNTER — Inpatient Hospital Stay (HOSPITAL_BASED_OUTPATIENT_CLINIC_OR_DEPARTMENT_OTHER): Payer: BLUE CROSS/BLUE SHIELD | Admitting: Oncology

## 2018-07-14 ENCOUNTER — Ambulatory Visit
Admission: RE | Admit: 2018-07-14 | Discharge: 2018-07-14 | Disposition: A | Discharge status: Home / Self Care | Payer: BLUE CROSS/BLUE SHIELD | Source: Ambulatory Visit | Attending: Oncology | Admitting: Oncology

## 2018-07-14 ENCOUNTER — Inpatient Hospital Stay: Payer: BLUE CROSS/BLUE SHIELD | Attending: Oncology

## 2018-07-14 ENCOUNTER — Ambulatory Visit: Admission: RE | Admit: 2018-07-14 | Payer: BLUE CROSS/BLUE SHIELD | Source: Ambulatory Visit

## 2018-07-14 ENCOUNTER — Other Ambulatory Visit: Payer: Self-pay

## 2018-07-14 ENCOUNTER — Encounter: Payer: Self-pay | Admitting: Oncology

## 2018-07-14 VITALS — BP 103/74 | HR 73 | Temp 86.5°F | Resp 16 | Ht 63.0 in | Wt 151.3 lb

## 2018-07-14 DIAGNOSIS — Z720 Tobacco use: Secondary | ICD-10-CM | POA: Diagnosis not present

## 2018-07-14 DIAGNOSIS — R61 Generalized hyperhidrosis: Secondary | ICD-10-CM | POA: Insufficient documentation

## 2018-07-14 DIAGNOSIS — C9110 Chronic lymphocytic leukemia of B-cell type not having achieved remission: Secondary | ICD-10-CM

## 2018-07-14 DIAGNOSIS — R591 Generalized enlarged lymph nodes: Secondary | ICD-10-CM | POA: Diagnosis not present

## 2018-07-14 DIAGNOSIS — R590 Localized enlarged lymph nodes: Secondary | ICD-10-CM | POA: Diagnosis not present

## 2018-07-14 LAB — COMPREHENSIVE METABOLIC PANEL
ALK PHOS: 58 U/L (ref 38–126)
ALT: 21 U/L (ref 0–44)
ANION GAP: 4 — AB (ref 5–15)
AST: 26 U/L (ref 15–41)
Albumin: 4.1 g/dL (ref 3.5–5.0)
BUN: 21 mg/dL — ABNORMAL HIGH (ref 6–20)
CALCIUM: 8.7 mg/dL — AB (ref 8.9–10.3)
CO2: 28 mmol/L (ref 22–32)
Chloride: 104 mmol/L (ref 98–111)
Creatinine, Ser: 0.94 mg/dL (ref 0.44–1.00)
GFR calc Af Amer: >60 mL/min (ref >60)
GFR calc non Af Amer: >60 mL/min (ref >60)
GLUCOSE: 111 mg/dL — AB (ref 70–99)
POTASSIUM: 3.9 mmol/L (ref 3.5–5.1)
Sodium: 136 mmol/L (ref 135–145)
Total Bilirubin: 0.6 mg/dL (ref 0.3–1.2)
Total Protein: 6.6 g/dL (ref 6.5–8.1)

## 2018-07-14 LAB — CBC WITH DIFFERENTIAL/PLATELET
ABS IMMATURE GRANULOCYTES: 0.07 10*3/uL (ref 0.00–0.07)
Basophils Absolute: 0 10*3/uL (ref 0.0–0.1)
Basophils Relative: 0 %
Eosinophils Absolute: 0.1 10*3/uL (ref 0.0–0.5)
Eosinophils Relative: 1 %
HEMATOCRIT: 38.6 % (ref 36.0–46.0)
HEMOGLOBIN: 12.9 g/dL (ref 12.0–15.0)
IMMATURE GRANULOCYTES: 1 %
LYMPHS ABS: 6.1 10*3/uL — AB (ref 0.7–4.0)
LYMPHS PCT: 60 %
MCH: 32.3 pg (ref 26.0–34.0)
MCHC: 33.4 g/dL (ref 30.0–36.0)
MCV: 96.7 fL (ref 80.0–100.0)
MONOS PCT: 11 %
Monocytes Absolute: 1 10*3/uL (ref 0.1–1.0)
NEUTROS ABS: 2.6 10*3/uL (ref 1.7–7.7)
NEUTROS PCT: 27 %
Platelets: 167 10*3/uL (ref 150–400)
RBC: 3.99 MIL/uL (ref 3.87–5.11)
RDW: 12.1 % (ref 11.5–15.5)
WBC: 9.9 10*3/uL (ref 4.0–10.5)
nRBC: 0 % (ref 0.0–0.2)

## 2018-07-14 MED ORDER — IOPAMIDOL (ISOVUE-300) INJECTION 61%
100.0000 mL | Freq: Once | INTRAVENOUS | Status: AC | PRN
  Administered 2018-07-14: 100 mL via INTRAVENOUS

## 2018-07-14 NOTE — Progress Notes (Signed)
Patient here for results. No changes since last appointment 

## 2018-07-16 ENCOUNTER — Inpatient Hospital Stay: Payer: BLUE CROSS/BLUE SHIELD | Admitting: Oncology

## 2018-07-16 ENCOUNTER — Telehealth: Payer: Self-pay | Admitting: Pharmacist

## 2018-07-16 ENCOUNTER — Inpatient Hospital Stay: Payer: BLUE CROSS/BLUE SHIELD

## 2018-07-16 ENCOUNTER — Telehealth: Payer: Self-pay | Admitting: Pharmacy Technician

## 2018-07-16 DIAGNOSIS — C9110 Chronic lymphocytic leukemia of B-cell type not having achieved remission: Secondary | ICD-10-CM

## 2018-07-16 MED ORDER — IBRUTINIB 420 MG PO TABS: 420.0000 mg | ORAL_TABLET | Freq: Every day | ORAL | 2 refills | Status: DC

## 2018-07-16 NOTE — Telephone Encounter (Signed)
Oral Chemotherapy Pharmacist Encounter   [name] asked me to call the patient and discuss Imbruvica (ibrutinib) with Ms. Marcellus because she had questions for the pharmacist. I attempted to call Ms. Gassen and was unable to reach her, LVM for patient to call me back.  [name], PharmD, BCPS, [facility] Hematology/Oncology Clinical Pharmacist [facility] [phone]  07/16/2018 10:16 AM

## 2018-07-16 NOTE — Telephone Encounter (Signed)
Oral Chemotherapy Pharmacist Encounter  Patient Education I spoke with patient for overview of new oral chemotherapy medication: Imbruvica (ibrutinib) for the treatment of CLL, planned duration until disease progression or unacceptable drug toxicity.   Counseled patient on administration, dosing, side effects, monitoring, drug-food interactions, safe handling, storage, and disposal. Patient will take 420 mg by mouth daily.  Side effects include but not limited to: fatigue, decreased wbc/plt/hgb, diarrhea, N/V.    Reviewed with patient importance of keeping a medication schedule and plan for any missed doses.  Monique [name] voiced understanding and appreciation. All questions answered. Medication handout placed in the mail.  Provided patient with [facility] phone number. Patient knows to call the office with questions or concerns. Oral Chemotherapy [facility] will continue to follow.  [name], PharmD, BCPS, [facility] Hematology/Oncology Clinical Pharmacist [facility] [phone]  07/16/2018 4:27 PM

## 2018-07-16 NOTE — Telephone Encounter (Signed)
Oral Oncology Pharmacist Encounter  Received new prescription for Imbruvica (ibrutinib) for the treatment of CLL, planned duration until disease progression or unacceptable drug toxicity.  CBC from 07/14/18 assessed, no relevant lab abnormalities. Prescription dose and frequency assessed.   Current medication list in Epic reviewed, one DDIs with ibrutinib identified: - Meloxicam: Ibrutinib may enhance the antiplatelet properties of meloxicam. Monitor CBC and for s/sx of bleeding.  [name], PharmD, BCPS, [facility] Hematology/Oncology Clinical Pharmacist [facility] [phone]  07/16/2018 10:18 AM

## 2018-07-17 NOTE — Progress Notes (Signed)
[place]  Telephone:([phone]) [phone] Fax:(336) [phone]  ID: Monique [name] OB: [birth year]  MR#: [id number]  KVQ#:[id number]  Patient Care Team: [name], MD as PCP - General (Family Medicine)  CHIEF COMPLAINT: CLL.  INTERVAL HISTORY: Patient returns to clinic today for further evaluation and discussion of her imaging results.  Patient states the lymphadenopathy in her axilla is significantly worse and now painful.  She is also having significant night sweats.  Despite this, she continues to be active and work full-time.  She has no neurologic complaints. She denies any fevers, illnesses, or weight loss. She has no chest pain or shortness of breath. She denies any nausea, vomiting, constipation, or diarrhea.  Patient offers no further specific complaints today.  REVIEW OF SYSTEMS:   Review of Systems  Constitutional: Positive for diaphoresis. Negative for fever, malaise/fatigue and weight loss.  HENT: Negative.  Negative for congestion.   Respiratory: Negative.  Negative for cough and shortness of breath.   Cardiovascular: Negative.  Negative for chest pain and leg swelling.  Gastrointestinal: Negative.  Negative for abdominal pain and constipation.  Genitourinary: Negative.  Negative for dysuria and frequency.  Musculoskeletal: Negative.  Negative for myalgias and neck pain.  Skin: Negative.  Negative for rash.  Neurological: Positive for sensory change. Negative for tingling, focal weakness and weakness.  Psychiatric/Behavioral: The patient is nervous/anxious.     As per HPI. Otherwise, a complete review of systems is negative.  PAST MEDICAL HISTORY: Past Medical History:  Diagnosis Date  . Anxiety   . Bursitis    leg pain  . Cervical cancer ([place])    hx LEEP over 18 years ago.   . Cervical intraepithelial neoplasia I   . Chronic lymphocytic leukemia ([place]) 2018   Dr Grayland Ormond  . Depression   . Eating disorder   . History of self-harm   .  Insomnia   . Obsession   . Pap smear abnormality of cervix with LGSIL   . Tobacco abuse   . Vitamin B12 deficiency (non anemic)     PAST SURGICAL HISTORY: Past Surgical History:  Procedure Laterality Date  . BREAST BIOPSY Right 01/05/2018   US guided biopsy of 2 areas and 1 lymph node, MIXED INFLAMMATION AND GIANT CELL REACTION  . CERVICAL BIOPSY  W/ LOOP ELECTRODE EXCISION    . OTHER SURGICAL HISTORY     scar tissue removed from vocal cords  . TUBAL LIGATION      FAMILY HISTORY: Family History  Problem Relation Age of Onset  . Cancer Mother        thyroid  . Alcohol abuse Father   . Depression Sister   . Cancer Sister        cevical  . Alcohol abuse Brother   . Depression Brother   . Bipolar disorder Brother   . ADD / ADHD Son   . Breast cancer Neg Hx     ADVANCED DIRECTIVES (Y/N):  N  HEALTH MAINTENANCE: Social History   Tobacco Use  . Smoking status: Current Every Day Smoker    Packs/day: 1.00    Years: 31.00    Pack years: 31.00    Start date: 09/25/1986  . Smokeless tobacco: Never Used  . Tobacco comment: Would like to discuss Chantix- she forgot to take this medication  Substance Use Topics  . Alcohol use: Yes    Alcohol/week: 0.0 standard drinks    Comment: rarely  . Drug use: Yes    Types: Marijuana  Colonoscopy:  PAP:  Bone density:  Lipid panel:  No Known Allergies  Current Outpatient Medications  Medication Sig Dispense Refill  . ANORO ELLIPTA 62.5-25 MCG/INH AEPB INHALE 1 PUFF INTO THE LUNGS EVERY DAY 60 each 0  . Calcium Carbonate-Vit D-Min (GNP CALCIUM 1200) 1200-1000 MG-UNIT CHEW Chew 1,200 mg by mouth daily with breakfast. Take in combination with vitamin D and magnesium. 30 tablet 5  . Cholecalciferol (VITAMIN D3) 5000 units CAPS Take 1 capsule (5,000 Units total) by mouth daily with breakfast. Take along with calcium and magnesium. 30 capsule 5  . gabapentin (NEURONTIN) 100 MG capsule Take 100 mg twice a day for one week, then  increase to 200 mg(2 tablets) twice a day and continue    . Magnesium 500 MG CAPS Take 1 capsule (500 mg total) by mouth 2 (two) times daily at 8 am and 10 pm. 60 capsule 5  . meloxicam (MOBIC) 15 MG tablet Take 1 tablet (15 mg total) by mouth daily. 180 tablet 0  . tiZANidine (ZANAFLEX) 2 MG tablet Take 1 tablet (2 mg total) by mouth every 8 (eight) hours as needed for muscle spasms. 90 tablet 5  . Ibrutinib 420 MG TABS Take 420 mg by mouth daily. 28 tablet 2   No current facility-administered medications for this visit.     OBJECTIVE: Vitals:   07/14/18 1302 07/14/18 1310  BP:  103/74  Pulse:  73  Resp: 16   Temp:  (!) 86.5 F (30.3 C)     Body mass index is 26.8 kg/m.    ECOG FS:0 - Asymptomatic  General: Well-developed, well-nourished, no acute distress. Eyes: Pink conjunctiva, anicteric sclera. HEENT: Normocephalic, moist mucous membranes, clear oropharnyx. Lungs: Clear to auscultation bilaterally. Heart: Regular rate and rhythm. No rubs, murmurs, or gallops. Abdomen: Soft, nontender, nondistended. No organomegaly noted, normoactive bowel sounds. Musculoskeletal: No edema, cyanosis, or clubbing. Neuro: Alert, answering all questions appropriately. Cranial nerves grossly intact. Skin: No rashes or petechiae noted. Psych: Normal affect. Lymphatics: Minimally palpable axillary lymphadenopathy.  No other lymphadenopathy palpated.  LAB RESULTS:  Lab Results  Component Value Date   NA 136 07/14/2018   K 3.9 07/14/2018   CL 104 07/14/2018   CO2 28 07/14/2018   GLUCOSE 111 (H) 07/14/2018   BUN 21 (H) 07/14/2018   CREATININE 0.94 07/14/2018   CALCIUM 8.7 (L) 07/14/2018   PROT 6.6 07/14/2018   ALBUMIN 4.1 07/14/2018   AST 26 07/14/2018   ALT 21 07/14/2018   ALKPHOS 58 07/14/2018   BILITOT 0.6 07/14/2018   GFRNONAA >60 07/14/2018   GFRAA >60 07/14/2018    Lab Results  Component Value Date   WBC 9.9 07/14/2018   NEUTROABS 2.6 07/14/2018   HGB 12.9 07/14/2018   HCT  38.6 07/14/2018   MCV 96.7 07/14/2018   PLT 167 07/14/2018     STUDIES: Ct Soft Tissue Neck W Contrast  Result Date: 07/14/2018 CLINICAL DATA:  CLL with progression.  Restaging. EXAM: CT NECK WITH CONTRAST TECHNIQUE: Multidetector CT imaging of the neck was performed using the standard protocol following the bolus administration of intravenous contrast. CONTRAST:  157mL ISOVUE-300 IOPAMIDOL (ISOVUE-300) INJECTION 61% COMPARISON:  CT neck 05/06/2018 FINDINGS: Pharynx and larynx: Normal. No mass or swelling. Salivary glands: No inflammation, mass, or stone. Thyroid: Normal Lymph nodes: Bilateral cervical lymphadenopathy. Mild progression of lymph node size bilaterally. Index nodes: 1. Right jugular digastric node axial image 49 now measures 9 mm compared with 9.1 mm previously 2. Right upper jugular  node axial image 43 now measures 7.9 mm compared with 6.8 mm previously 3. Left jugular digastric node axial image 48 now measures 8.1 mm compared with 7.1 mm previously 4. Left posterior triangle lymph node axial image 64 now measures 11.9 mm compared with 10.8 mm previously. Vascular: Negative Limited intracranial: Negative Visualized orbits: Negative Mastoids and visualized paranasal sinuses: Negative Skeleton: Negative Upper chest: Bilateral axillary adenopathy and mediastinal adenopathy, reported separately on chest CT from today Other: None IMPRESSION: Bilateral cervical adenopathy, with mild progression since 05/06/2018. Electronically Signed   By: Franchot Gallo M.D.   On: 07/14/2018 10:18   Ct Chest W Contrast  Result Date: 07/14/2018 CLINICAL DATA:  Progressive CLL with increased axillary swelling EXAM: CT CHEST, ABDOMEN, AND PELVIS WITH CONTRAST TECHNIQUE: Multidetector CT imaging of the chest, abdomen and pelvis was performed following the standard protocol during bolus administration of intravenous contrast. CONTRAST:  124mL ISOVUE-300 IOPAMIDOL (ISOVUE-300) INJECTION 61% COMPARISON:  05/06/2018  FINDINGS: CT CHEST FINDINGS Cardiovascular: Heart is normal in size.  No pericardial effusion. No evidence of thoracic aortic aneurysm. Mild atherosclerotic calcifications of the aortic arch. Mediastinum/Nodes: Thoracic lymphadenopathy, mildly progressed, including: --1.7 cm left subpectoral node (series 4/image 10), previously 1.4 cm --1.6 cm short axis right axillary node (series 4/image 12), previously 1.5 cm --2.1 cm short axis left axillary node (series 4/image 17), previously 1.8 cm --1.5 cm short axis right paratracheal node (series 4/image 18), previously 1.4 cm --1.6 cm short axis low right paratracheal node (series 4/image 22), unchanged --1.4 cm short axis subcarinal node (series 4/image 26), previously 1.3 cm Visualized thyroid is unremarkable. Lungs/Pleura: Mild centrilobular emphysematous changes, upper lobe predominant. 4 mm triangular subpleural nodule in the lateral right upper lobe (series 6/image 39), unchanged, benign. 5 mm triangular subpleural nodule in the lateral right middle lobe along the minor fissure (series 6/image 80), unchanged, benign. No suspicious pulmonary nodules. No focal consolidation. No pleural effusion or pneumothorax. Musculoskeletal: Visualized osseous structures are within normal limits. CT ABDOMEN PELVIS FINDINGS Hepatobiliary: Liver is within normal limits. Gallbladder is unremarkable. No intrahepatic or extrahepatic ductal dilatation. Pancreas: Within normal limits. Spleen: Spleen is normal in size. Adrenals/Urinary Tract: Adrenal glands are within normal limits. Kidneys are within normal limits.  No hydronephrosis. Bladder is within normal limits. Stomach/Bowel: Stomach is within normal limits. No evidence of bowel obstruction. Normal appendix (coronal image 42). Vascular/Lymphatic: No evidence of abdominal aortic aneurysm. Progressive abdominopelvic lymphadenopathy, including: --2.4 cm short axis portacaval node (series 4/image 65), previously 2.0 cm --2.0 cm short  axis aortocaval node (series 4/image 35), previously 1.7 cm --2.4 cm short axis left jejunal mesenteric node (series 4/image 83), unchanged --3.9 cm short axis left para-aortic node anterior to the left psoas muscle (series 4/image 83), unchanged --3.2 cm short axis left external iliac node (series 4/image 112), previously 2.8 cm --2.7 cm short axis right external iliac node (series 4/image 112), previously 2.4 cm Reproductive: Uterus is within normal limits. No adnexal masses. Other: No abdominopelvic ascites. Musculoskeletal: Visualized osseous structures are within normal limits. IMPRESSION: Progressive lymphadenopathy in the chest abdomen and pelvis, with index lesions as above. Spleen is normal in size. Electronically Signed   By: Julian Hy M.D.   On: 07/14/2018 10:28   Ct Abdomen Pelvis W Contrast  Result Date: 07/14/2018 CLINICAL DATA:  Progressive CLL with increased axillary swelling EXAM: CT CHEST, ABDOMEN, AND PELVIS WITH CONTRAST TECHNIQUE: Multidetector CT imaging of the chest, abdomen and pelvis was performed following the standard protocol during bolus administration  of intravenous contrast. CONTRAST:  142mL ISOVUE-300 IOPAMIDOL (ISOVUE-300) INJECTION 61% COMPARISON:  05/06/2018 FINDINGS: CT CHEST FINDINGS Cardiovascular: Heart is normal in size.  No pericardial effusion. No evidence of thoracic aortic aneurysm. Mild atherosclerotic calcifications of the aortic arch. Mediastinum/Nodes: Thoracic lymphadenopathy, mildly progressed, including: --1.7 cm left subpectoral node (series 4/image 10), previously 1.4 cm --1.6 cm short axis right axillary node (series 4/image 12), previously 1.5 cm --2.1 cm short axis left axillary node (series 4/image 17), previously 1.8 cm --1.5 cm short axis right paratracheal node (series 4/image 18), previously 1.4 cm --1.6 cm short axis low right paratracheal node (series 4/image 22), unchanged --1.4 cm short axis subcarinal node (series 4/image 26), previously  1.3 cm Visualized thyroid is unremarkable. Lungs/Pleura: Mild centrilobular emphysematous changes, upper lobe predominant. 4 mm triangular subpleural nodule in the lateral right upper lobe (series 6/image 39), unchanged, benign. 5 mm triangular subpleural nodule in the lateral right middle lobe along the minor fissure (series 6/image 80), unchanged, benign. No suspicious pulmonary nodules. No focal consolidation. No pleural effusion or pneumothorax. Musculoskeletal: Visualized osseous structures are within normal limits. CT ABDOMEN PELVIS FINDINGS Hepatobiliary: Liver is within normal limits. Gallbladder is unremarkable. No intrahepatic or extrahepatic ductal dilatation. Pancreas: Within normal limits. Spleen: Spleen is normal in size. Adrenals/Urinary Tract: Adrenal glands are within normal limits. Kidneys are within normal limits.  No hydronephrosis. Bladder is within normal limits. Stomach/Bowel: Stomach is within normal limits. No evidence of bowel obstruction. Normal appendix (coronal image 42). Vascular/Lymphatic: No evidence of abdominal aortic aneurysm. Progressive abdominopelvic lymphadenopathy, including: --2.4 cm short axis portacaval node (series 4/image 65), previously 2.0 cm --2.0 cm short axis aortocaval node (series 4/image 35), previously 1.7 cm --2.4 cm short axis left jejunal mesenteric node (series 4/image 83), unchanged --3.9 cm short axis left para-aortic node anterior to the left psoas muscle (series 4/image 83), unchanged --3.2 cm short axis left external iliac node (series 4/image 112), previously 2.8 cm --2.7 cm short axis right external iliac node (series 4/image 112), previously 2.4 cm Reproductive: Uterus is within normal limits. No adnexal masses. Other: No abdominopelvic ascites. Musculoskeletal: Visualized osseous structures are within normal limits. IMPRESSION: Progressive lymphadenopathy in the chest abdomen and pelvis, with index lesions as above. Spleen is normal in size.  Electronically Signed   By: Julian Hy M.D.   On: 07/14/2018 10:28    ASSESSMENT: CLL, Rai stage 1, breast mass  PLAN:    1. CLL: Confirmed by peripheral blood flow cytometry.  Patient completed cycle 3 of Rituxan plus Treanda on March 26, 2018.  CT scan results from July 14, 2018 reviewed independently and reported as above with only mild progression of known lymphadenopathy with no lymph node increasing in size greater than 4 mm.  Despite only mild progression of disease, patient states her lymph nodes are bothersome and she is having drenching night sweats.  This patient has agreed to initiate treatment with 480 mg Imbruvica daily.  Return to clinic in 1 month for laboratory work and to assess her tolerance of treatment.  Patient will then return to clinic in 3 months with repeat imaging and further evaluation.   2.  Breast mass: Resolved.  I spent a total of 30 minutes face-to-face with the patient of which greater than 50% of the visit was spent in counseling and coordination of care as detailed above.  Patient expressed understanding and was in agreement with this plan. She also understands that She can call clinic at any time with any  questions, concerns, or complaints.    Lloyd Huger, MD   07/17/2018 7:06 AM

## 2018-07-21 ENCOUNTER — Other Ambulatory Visit: Payer: Self-pay | Admitting: *Deleted

## 2018-07-21 DIAGNOSIS — C9110 Chronic lymphocytic leukemia of B-cell type not having achieved remission: Secondary | ICD-10-CM

## 2018-07-22 ENCOUNTER — Telehealth: Payer: Self-pay | Admitting: Pharmacist

## 2018-07-22 NOTE — Telephone Encounter (Signed)
Monique [name] will be mailed 11/7 for delivery 11/8.

## 2018-07-22 NOTE — Telephone Encounter (Signed)
Oral Oncology Patient Advocate Encounter  Received notification from [employer] that prior authorization for Imbruvica is required.  PA submitted on CoverMyMeds Key [id number] Status is pending  Oral Oncology Clinic will continue to follow.

## 2018-07-22 NOTE — Telephone Encounter (Signed)
Oral Chemotherapy Pharmacist Encounter   Received a call from [facility]. They called on the request of Monique [name] to let the office know that she was done with her dental treatment.   Monique [name] wanted to wait until after her dental appt to start her Imbruvica (ibrutinib) we will now proceed with shipping the ibrutinib to Monique [name] for her to start therapy.   [name], PharmD, BCPS, [facility] Hematology/Oncology Clinical Pharmacist [facility] [phone]  07/22/2018 10:17 AM

## 2018-07-22 NOTE — Telephone Encounter (Signed)
Oral Oncology Patient Advocate Encounter  Prior Authorization for [name] has been approved.    PA# [id number] Effective dates: 06/16/18 through 07/15/21  Patients co-pay is $0.00  Oral Oncology Clinic will continue to follow.   [place] Patient [place] Phone [phone] Fax [phone]

## 2018-07-23 MED FILL — IMBRUVICA 420 MG TAB: 420 | 28 days supply | Qty: 28 | Fill #0

## 2018-07-26 ENCOUNTER — Other Ambulatory Visit: Payer: Self-pay | Admitting: Family Medicine

## 2018-07-26 DIAGNOSIS — J439 Emphysema, unspecified: Secondary | ICD-10-CM

## 2018-08-04 ENCOUNTER — Telehealth: Payer: Self-pay | Admitting: Family Medicine

## 2018-08-04 NOTE — Telephone Encounter (Signed)
I have not seen her in many months, can she come in? Her oncologist may be able to get the PA done

## 2018-08-04 NOTE — Telephone Encounter (Signed)
Please schedule patient for an office visit.

## 2018-08-04 NOTE — Telephone Encounter (Signed)
LVM for pt to call and schedule an appt °

## 2018-08-04 NOTE — Telephone Encounter (Signed)
A PA was attempted via CoverMyMeds but it stated that this drug was excluded. Please see the message below:  N/A  This medication may be excluded from the patient's benefit. For more information, please reach out to [employer] directly at [phone]. Message from [employer]: Drug is not covered by plan   Please advise

## 2018-08-04 NOTE — Telephone Encounter (Signed)
Copied from [place] [id number]. Topic: Quick Communication - See Telephone Encounter >> Aug 04, 2018  9:58 AM Burchel, Abbi R wrote: CRM for notification. See Telephone encounter for: 08/04/18.  Pt states she needs a statement medical necessity for Chantix, bc it has been denied.   [phone]

## 2018-08-06 ENCOUNTER — Ambulatory Visit: Payer: BLUE CROSS/BLUE SHIELD

## 2018-08-10 ENCOUNTER — Ambulatory Visit: Payer: BLUE CROSS/BLUE SHIELD | Admitting: Oncology

## 2018-08-10 ENCOUNTER — Other Ambulatory Visit: Payer: BLUE CROSS/BLUE SHIELD

## 2018-08-11 ENCOUNTER — Other Ambulatory Visit: Payer: BLUE CROSS/BLUE SHIELD

## 2018-08-11 ENCOUNTER — Ambulatory Visit: Payer: BLUE CROSS/BLUE SHIELD | Admitting: Oncology

## 2018-08-12 ENCOUNTER — Ambulatory Visit: Payer: Self-pay | Admitting: Family Medicine

## 2018-08-16 NOTE — Progress Notes (Signed)
[facility]  [phone] [phone]  ID: [name] OB: November 29, 1970  MR#: [id number]  LNL#:[id number]  Patient Care Team: [name], MD as PCP - General (Family Medicine) Lloyd Huger, MD as Consulting Physician (Oncology)  CHIEF COMPLAINT: CLL.  INTERVAL HISTORY: Patient returns to clinic today for further evaluation and to assess her toleration of Imbruvica.  She has noticed increased fatigue, but otherwise feels well.  She has a nonpruritic rash on her lower extremities that appears unrelated to treatment.  The lymphadenopathy in her axilla has decreased in size and is no longer painful.  Her night sweats have diminished as well.  She has no neurologic complaints. She denies any fevers, illnesses, or weight loss. She has no chest pain or shortness of breath. She denies any nausea, vomiting, constipation, or diarrhea.  She has no urinary complaints.  Patient offers no further specific complaints today.   REVIEW OF SYSTEMS:   Review of Systems  Constitutional: Positive for diaphoresis. Negative for fever, malaise/fatigue and weight loss.  HENT: Negative.  Negative for congestion.   Respiratory: Negative.  Negative for cough and shortness of breath.   Cardiovascular: Negative.  Negative for chest pain and leg swelling.  Gastrointestinal: Negative.  Negative for abdominal pain and constipation.  Genitourinary: Negative.  Negative for dysuria and frequency.  Musculoskeletal: Negative.  Negative for myalgias and neck pain.  Skin: Positive for rash.  Neurological: Positive for sensory change. Negative for tingling, focal weakness and weakness.  Psychiatric/Behavioral: The patient is nervous/anxious.     As per HPI. Otherwise, a complete review of systems is negative.  PAST MEDICAL HISTORY: Past Medical History:  Diagnosis Date  . Anxiety   . Bursitis    leg pain  . Cervical cancer ([place])    hx LEEP over 18 years ago.   .  Cervical intraepithelial neoplasia I   . Chronic lymphocytic leukemia ([place]) 2018   Dr Grayland Ormond  . Depression   . Eating disorder   . History of self-harm   . Insomnia   . Obsession   . Pap smear abnormality of cervix with LGSIL   . Tobacco abuse   . Vitamin B12 deficiency (non anemic)     PAST SURGICAL HISTORY: Past Surgical History:  Procedure Laterality Date  . BREAST BIOPSY Right 01/05/2018   US guided biopsy of 2 areas and 1 lymph node, MIXED INFLAMMATION AND GIANT CELL REACTION  . CERVICAL BIOPSY  W/ LOOP ELECTRODE EXCISION    . OTHER SURGICAL HISTORY     scar tissue removed from vocal cords  . TUBAL LIGATION      FAMILY HISTORY: Family History  Problem Relation Age of Onset  . Cancer Mother        thyroid  . Alcohol abuse Father   . Depression Sister   . Cancer Sister        cevical  . Alcohol abuse Brother   . Depression Brother   . Bipolar disorder Brother   . ADD / ADHD Son   . Breast cancer Neg Hx     ADVANCED DIRECTIVES (Y/N):  N  HEALTH MAINTENANCE: Social History   Tobacco Use  . Smoking status: Current Every Day Smoker    Packs/day: 1.00    Years: 31.00    Pack years: 31.00    Start date: 09/25/1986  . Smokeless tobacco: Never Used  Substance Use Topics  . Alcohol use: Yes    Alcohol/week: 0.0 standard drinks  Comment: rarely  . Drug use: Yes    Types: Marijuana     Colonoscopy:  PAP:  Bone density:  Lipid panel:  No Known Allergies  Current Outpatient Medications  Medication Sig Dispense Refill  . ANORO ELLIPTA 62.5-25 MCG/INH AEPB INHALE 1 PUFF INTO THE LUNGS EVERY DAY 60 each 0  . Calcium Carbonate-Vit D-Min (GNP CALCIUM 1200) 1200-1000 MG-UNIT CHEW Chew 1,200 mg by mouth daily with breakfast. Take in combination with vitamin D and magnesium. 30 tablet 5  . Cholecalciferol (VITAMIN D3) 5000 units CAPS Take 1 capsule (5,000 Units total) by mouth daily with breakfast. Take along with calcium and magnesium. 30 capsule 5  .  gabapentin (NEURONTIN) 100 MG capsule Take 100 mg twice a day for one week, then increase to 200 mg(2 tablets) twice a day and continue    . Ibrutinib 420 MG TABS Take 420 mg by mouth daily. 28 tablet 2  . Magnesium 500 MG CAPS Take 1 capsule (500 mg total) by mouth 2 (two) times daily at 8 am and 10 pm. 60 capsule 5  . meloxicam (MOBIC) 15 MG tablet Take 1 tablet (15 mg total) by mouth daily. 180 tablet 0  . tiZANidine (ZANAFLEX) 2 MG tablet Take 1 tablet (2 mg total) by mouth every 8 (eight) hours as needed for muscle spasms. 90 tablet 5   No current facility-administered medications for this visit.     OBJECTIVE: Vitals:   08/18/18 1524  BP: 118/79  Pulse: 72  Resp: 18  Temp: (!) 97.4 F (36.3 C)     Body mass index is 27.26 kg/m.    ECOG FS:0 - Asymptomatic  General: Well-developed, well-nourished, no acute distress. Eyes: Pink conjunctiva, anicteric sclera. HEENT: Normocephalic, moist mucous membranes, clear oropharnyx. Lungs: Clear to auscultation bilaterally. Heart: Regular rate and rhythm. No rubs, murmurs, or gallops. Abdomen: Soft, nontender, nondistended. No organomegaly noted, normoactive bowel sounds. Musculoskeletal: No edema, cyanosis, or clubbing. Neuro: Alert, answering all questions appropriately. Cranial nerves grossly intact. Skin: Rash is nonpalpable. Psych: Normal affect. Lymphatics: Minimally palpable axillary lymphadenopathy.  LAB RESULTS:  Lab Results  Component Value Date   NA 141 08/18/2018   K 4.3 08/18/2018   CL 106 08/18/2018   CO2 28 08/18/2018   GLUCOSE 87 08/18/2018   BUN 28 (H) 08/18/2018   CREATININE 0.87 08/18/2018   CALCIUM 8.8 (L) 08/18/2018   PROT 6.4 (L) 08/18/2018   ALBUMIN 4.1 08/18/2018   AST 19 08/18/2018   ALT 15 08/18/2018   ALKPHOS 47 08/18/2018   BILITOT 0.8 08/18/2018   GFRNONAA >60 08/18/2018   GFRAA >60 08/18/2018    Lab Results  Component Value Date   WBC 19.9 (H) 08/18/2018   NEUTROABS 1.7 08/18/2018   HGB  12.3 08/18/2018   HCT 37.2 08/18/2018   MCV 98.9 08/18/2018   PLT 154 08/18/2018     STUDIES: No results found.  ASSESSMENT: CLL, Rai stage 1, breast mass  PLAN:    1. CLL: Confirmed by peripheral blood flow cytometry.  Patient completed cycle 3 of Rituxan plus Treanda on March 26, 2018.  CT scan results from July 14, 2018 reviewed independently with only mild progression of known lymphadenopathy with no lymph node increasing in size greater than 4 mm.  Despite only mild progression of disease, patient reported her lymph nodes are bothersome and she is having drenching night sweats.  This patient has agreed to initiate treatment with 480 mg Imbruvica daily.  Her symptoms have improved and  she is tolerating treatment well.  Return to clinic in approximately 1 month for laboratory work only and then in February 2020 for repeat imaging and further evaluation. 2.  Leukocytosis: Likely related to CLL and treatment.  Monitor.   3.  Rash: Unclear etiology.  Monitor.  I spent a total of 30 minutes face-to-face with the patient of which greater than 50% of the visit was spent in counseling and coordination of care as detailed above.  Patient expressed understanding and was in agreement with this plan. She also understands that She can call clinic at any time with any questions, concerns, or complaints.    Lloyd Huger, MD   08/21/2018 10:36 AM

## 2018-08-18 ENCOUNTER — Other Ambulatory Visit: Payer: Self-pay

## 2018-08-18 ENCOUNTER — Ambulatory Visit: Payer: BLUE CROSS/BLUE SHIELD

## 2018-08-18 ENCOUNTER — Encounter: Payer: Self-pay | Admitting: Oncology

## 2018-08-18 ENCOUNTER — Inpatient Hospital Stay (HOSPITAL_BASED_OUTPATIENT_CLINIC_OR_DEPARTMENT_OTHER): Payer: BLUE CROSS/BLUE SHIELD | Admitting: Oncology

## 2018-08-18 ENCOUNTER — Inpatient Hospital Stay: Payer: BLUE CROSS/BLUE SHIELD | Attending: Oncology

## 2018-08-18 VITALS — BP 118/79 | HR 72 | Temp 97.4°F | Resp 18 | Wt 153.9 lb

## 2018-08-18 DIAGNOSIS — C9110 Chronic lymphocytic leukemia of B-cell type not having achieved remission: Secondary | ICD-10-CM | POA: Insufficient documentation

## 2018-08-18 DIAGNOSIS — D72829 Elevated white blood cell count, unspecified: Secondary | ICD-10-CM

## 2018-08-18 DIAGNOSIS — Z716 Tobacco abuse counseling: Secondary | ICD-10-CM

## 2018-08-18 DIAGNOSIS — R5383 Other fatigue: Secondary | ICD-10-CM | POA: Diagnosis not present

## 2018-08-18 DIAGNOSIS — J439 Emphysema, unspecified: Secondary | ICD-10-CM

## 2018-08-18 DIAGNOSIS — R21 Rash and other nonspecific skin eruption: Secondary | ICD-10-CM | POA: Diagnosis not present

## 2018-08-18 DIAGNOSIS — Z720 Tobacco use: Secondary | ICD-10-CM | POA: Insufficient documentation

## 2018-08-18 LAB — CBC WITH DIFFERENTIAL/PLATELET
Abs Immature Granulocytes: 0.05 10*3/uL (ref 0.00–0.07)
BASOS ABS: 0.1 10*3/uL (ref 0.0–0.1)
Basophils Relative: 1 %
Eosinophils Absolute: 0 10*3/uL (ref 0.0–0.5)
Eosinophils Relative: 0 %
HCT: 37.2 % (ref 36.0–46.0)
Hemoglobin: 12.3 g/dL (ref 12.0–15.0)
Immature Granulocytes: 0 %
Lymphocytes Relative: 88 %
Lymphs Abs: 17.5 10*3/uL — ABNORMAL HIGH (ref 0.7–4.0)
MCH: 32.7 pg (ref 26.0–34.0)
MCHC: 33.1 g/dL (ref 30.0–36.0)
MCV: 98.9 fL (ref 80.0–100.0)
Monocytes Absolute: 0.5 10*3/uL (ref 0.1–1.0)
Monocytes Relative: 3 %
NEUTROS ABS: 1.7 10*3/uL (ref 1.7–7.7)
NEUTROS PCT: 8 %
NRBC: 0 % (ref 0.0–0.2)
Platelets: 154 10*3/uL (ref 150–400)
RBC: 3.76 MIL/uL — AB (ref 3.87–5.11)
RDW: 12.7 % (ref 11.5–15.5)
WBC: 19.9 10*3/uL — AB (ref 4.0–10.5)

## 2018-08-18 LAB — COMPREHENSIVE METABOLIC PANEL
ALBUMIN: 4.1 g/dL (ref 3.5–5.0)
ALT: 15 U/L (ref 0–44)
AST: 19 U/L (ref 15–41)
Alkaline Phosphatase: 47 U/L (ref 38–126)
Anion gap: 7 (ref 5–15)
BUN: 28 mg/dL — ABNORMAL HIGH (ref 6–20)
CHLORIDE: 106 mmol/L (ref 98–111)
CO2: 28 mmol/L (ref 22–32)
Calcium: 8.8 mg/dL — ABNORMAL LOW (ref 8.9–10.3)
Creatinine, Ser: 0.87 mg/dL (ref 0.44–1.00)
GFR calc Af Amer: >60 mL/min (ref >60)
Glucose, Bld: 87 mg/dL (ref 70–99)
POTASSIUM: 4.3 mmol/L (ref 3.5–5.1)
Sodium: 141 mmol/L (ref 135–145)
TOTAL PROTEIN: 6.4 g/dL — AB (ref 6.5–8.1)
Total Bilirubin: 0.8 mg/dL (ref 0.3–1.2)

## 2018-08-18 LAB — PHOSPHORUS: PHOSPHORUS: 4 mg/dL (ref 2.5–4.6)

## 2018-08-18 LAB — MAGNESIUM: MAGNESIUM: 2.1 mg/dL (ref 1.7–2.4)

## 2018-08-18 MED ORDER — VARENICLINE TARTRATE 1 MG PO TABS: 1.0000 mg | ORAL_TABLET | Freq: Two times a day (BID) | ORAL | 3 refills | Status: DC

## 2018-08-18 MED FILL — IMBRUVICA 420 MG TAB: 420 | 28 days supply | Qty: 28 | Fill #1

## 2018-08-18 NOTE — Progress Notes (Signed)
Patient has started Imbruvica since last visit. Patient reports red bumps to arms and legs, does not burn or itch. She also reports red spots to neck and chest, bruising easier than normal.

## 2018-08-18 NOTE — Progress Notes (Signed)
Survivorship Care Plan visit completed.  Treatment summary reviewed and given to patient.  ASCO answers booklet reviewed and given to patient.  CARE program and Cancer Transitions discussed with patient along with other resources cancer center offers to patients and caregivers.  Patient verbalized understanding.    

## 2018-08-21 ENCOUNTER — Encounter: Payer: Self-pay | Admitting: Family Medicine

## 2018-08-21 ENCOUNTER — Ambulatory Visit (INDEPENDENT_AMBULATORY_CARE_PROVIDER_SITE_OTHER): Payer: BLUE CROSS/BLUE SHIELD | Admitting: Family Medicine

## 2018-08-21 VITALS — BP 110/70 | HR 86 | Temp 98.3°F | Resp 16 | Ht 63.0 in | Wt 154.4 lb

## 2018-08-21 DIAGNOSIS — L729 Follicular cyst of the skin and subcutaneous tissue, unspecified: Secondary | ICD-10-CM | POA: Diagnosis not present

## 2018-08-21 DIAGNOSIS — R21 Rash and other nonspecific skin eruption: Secondary | ICD-10-CM | POA: Diagnosis not present

## 2018-08-21 DIAGNOSIS — G500 Trigeminal neuralgia: Secondary | ICD-10-CM

## 2018-08-21 DIAGNOSIS — J439 Emphysema, unspecified: Secondary | ICD-10-CM

## 2018-08-21 DIAGNOSIS — R233 Spontaneous ecchymoses: Secondary | ICD-10-CM | POA: Diagnosis not present

## 2018-08-21 DIAGNOSIS — Z720 Tobacco use: Secondary | ICD-10-CM

## 2018-08-21 DIAGNOSIS — C9110 Chronic lymphocytic leukemia of B-cell type not having achieved remission: Secondary | ICD-10-CM | POA: Diagnosis not present

## 2018-08-21 NOTE — Progress Notes (Signed)
Name: Monique [name]   MRN: [id number]    DOB: 04-29-1971   Date:08/21/2018       Progress Note  Subjective  Chief Complaint  Chief Complaint  Patient presents with  . Rash    face, leg and arms. Possible reaction from chemo pill  . Follow-up    HPI  Trigeminal Neuralgia: she is doing well - has not had any issues since last visit. Diagnosed 08/2017 , took carbamazepine for possible trigeminal neuralgia and symptoms resolved so she stopped medication and cancelled follow up with neurologist.  Depression: she states she is feeling well, wants to stay off medications at this time, states family was here recently visiting her. Denies suicidal thoughts or ideation. She talked to someone at the cancer center about counseling/group therapy at the cancer center.   CLL: she has follow up with [name]  she still has palpable lymphadenopathy, no chills or fever, she has night sweats at times.  Had follow up 3 days ago - labs showed elevated WBC which she was told is SE of her new medication Ibrutinib  Cyst scalp: sometimes causes pain on left nuchal area. Stable since last visit.  Rash: Patient has had red blotchy rash that is not itchy or painful to her face, bilateral forearms, bilateral anterior legs, and upper chest. She did check with her oncologist [name], and he states this is unlikely to be a reaction to her Ibrutinib which she takes for CLL and has been on for 1 month.  She does not have known allergens.  She switches between [place] for soaps and rotates 3-4 different laundry detergents - nothing new that she has never used before.  Recent labs show elevated WBC - she states she was told this can happen with the new medication.  Does endorse hand joint stiffness bilaterally for several months intermittent.  Emphysema: sheis ready to quit smoking, she states Anoro has helped with her symptoms but ran out of medication, explained she has refills at pharmacy. She  states cough resolved, no wheezing. Does have some shortness of breath with exertion.  CT chest showed emphysema. Was down to 5 cigarettes a day on Chantix, but insurance stopped coverage and she is back up to 1ppd.  Patient Active Problem List   Diagnosis Date Noted  . Hypocalcemia 06/03/2018  . Vitamin D insufficiency 06/03/2018  . Abnormal MRI, lumbar spine (04/20/2017) 06/03/2018  . Chronic hip pain (Right) 06/03/2018  . Spondylosis without myelopathy or radiculopathy, lumbar region 06/03/2018  . DDD (degenerative disc disease), lumbar 06/02/2018  . Lumbar facet hypertrophy 06/02/2018  . Lumbar facet arthropathy 06/02/2018  . Lumbar facet syndrome (Bilateral) (R>L) 06/02/2018  . Marijuana use 05/19/2018  . Chronic lower extremity pain (Primary Area of Pain) (Bilateral) (R>L) 05/13/2018  . Chronic low back pain (Secondary Area of Pain) (Bilateral) (R>L) w/ sciatica (Bilateral) 05/13/2018  . Chronic pain syndrome 05/13/2018  . Opiate use 05/13/2018  . Disorder of skeletal system 05/13/2018  . Pharmacologic therapy 05/13/2018  . Problems influencing health status 05/13/2018  . Mastalgia 05/05/2018  . Breast mass, right 01/07/2018  . Facial pain 12/11/2017  . Tingling 12/11/2017  . Thoracic aortic atherosclerosis ([place]) 08/20/2017  . Emphysema of lung ([place]) 08/20/2017  . Adductor tendinitis 04/23/2017  . Trochanteric bursitis of right hip 04/23/2017  . GERD without esophagitis 12/11/2016  . CLL (chronic lymphocytic leukemia) ([place]) 11/24/2016  . Pap smear abnormality of cervix/human papillomavirus (HPV) positive 09/12/2016  . Stress incontinence  09/04/2016  . B12 deficiency 07/10/2015  . Insomnia, persistent 07/10/2015  . 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 Anorexia nervosa, restricting type 07/10/2015  . Anxiety, generalized 07/10/2015  . H/O suicide attempt 07/10/2015  . Lymphocytosis 07/10/2015  . Obsessive-compulsive disorder 07/10/2015    . Tobacco use 07/10/2015  . History of cervical dysplasia 07/18/2014    Past Surgical History:  Procedure Laterality Date  . BREAST BIOPSY Right 01/05/2018   US guided biopsy of 2 areas and 1 lymph node, MIXED INFLAMMATION AND GIANT CELL REACTION  . CERVICAL BIOPSY  W/ LOOP ELECTRODE EXCISION    . OTHER SURGICAL HISTORY     scar tissue removed from vocal cords  . TUBAL LIGATION      Family History  Problem Relation Age of Onset  . Cancer Mother        thyroid  . Alcohol abuse Father   . Depression Sister   . Cancer Sister        cevical  . Alcohol abuse Brother   . Depression Brother   . Bipolar disorder Brother   . ADD / ADHD Son   . Breast cancer Neg Hx     Social History   Socioeconomic History  . Marital status: Divorced    Spouse name: Not on file  . Number of children: Not on file  . Years of education: Not on file  . Highest education level: Not on file  Occupational History  . Occupation: Best boy: [employer]  . Financial resource strain: Not on file  . Food insecurity:    Worry: Not on file    Inability: Not on file  . Transportation needs:    Medical: Not on file    Non-medical: Not on file  Tobacco Use  . Smoking status: Current Every Day Smoker    Packs/day: 1.00    Years: 31.00    Pack years: 31.00    Start date: 09/25/1986  . Smokeless tobacco: Never Used  . Tobacco comment: Would like to discuss Chantix- she forgot to take this medication  Substance and Sexual Activity  . Alcohol use: Yes    Alcohol/week: 0.0 standard drinks    Comment: rarely  . Drug use: Yes    Types: Marijuana  . Sexual activity: Yes    Partners: Male    Birth control/protection: None  Lifestyle  . Physical activity:    Days per week: Not on file    Minutes per session: Not on file  . Stress: Not on file  Relationships  . Social connections:    Talks on phone: Not on file    Gets together: Not on file    Attends religious service: Not  on file    Active member of club or organization: Not on file    Attends meetings of clubs or organizations: Not on file    Relationship status: Not on file  . Intimate partner violence:    Fear of current or ex partner: Not on file    Emotionally abused: Not on file    Physically abused: Not on file    Forced sexual activity: Not on file  Other Topics Concern  . Not on file  Social History Narrative  . Not on file     Current Outpatient Medications:  .  ANORO ELLIPTA 62.5-25 MCG/INH AEPB, INHALE 1 PUFF INTO THE LUNGS EVERY DAY, Disp: 60 each, Rfl: 0 .  Calcium Carbonate-Vit  D-Min (GNP CALCIUM 1200) 1200-1000 MG-UNIT CHEW, Chew 1,200 mg by mouth daily with breakfast. Take in combination with vitamin D and magnesium., Disp: 30 tablet, Rfl: 5 .  Cholecalciferol (VITAMIN D3) 5000 units CAPS, Take 1 capsule (5,000 Units total) by mouth daily with breakfast. Take along with calcium and magnesium., Disp: 30 capsule, Rfl: 5 .  gabapentin (NEURONTIN) 100 MG capsule, Take 100 mg twice a day for one week, then increase to 200 mg(2 tablets) twice a day and continue, Disp: , Rfl:  .  Ibrutinib 420 MG TABS, Take 420 mg by mouth daily., Disp: 28 tablet, Rfl: 2 .  Magnesium 500 MG CAPS, Take 1 capsule (500 mg total) by mouth 2 (two) times daily at 8 am and 10 pm., Disp: 60 capsule, Rfl: 5 .  meloxicam (MOBIC) 15 MG tablet, Take 1 tablet (15 mg total) by mouth daily., Disp: 180 tablet, Rfl: 0 .  tiZANidine (ZANAFLEX) 2 MG tablet, Take 1 tablet (2 mg total) by mouth every 8 (eight) hours as needed for muscle spasms., Disp: 90 tablet, Rfl: 5 .  varenicline (CHANTIX) 1 MG tablet, Take 1 tablet (1 mg total) by mouth 2 (two) times daily. (Patient not taking: Reported on 08/21/2018), Disp: 56 tablet, Rfl: 3  No Known Allergies  I personally reviewed active problem list, medication list, allergies, notes from last encounter, lab results with the patient/caregiver today.   ROS  Constitutional: Negative for  fever or weight change.  Respiratory: Negative for cough and shortness of breath.   Cardiovascular: Negative for chest pain or palpitations.  Gastrointestinal: Negative for abdominal pain, no bowel changes.  Musculoskeletal: Negative for gait problem or joint swelling.  Skin: Positive for rash.  Neurological: Negative for dizziness or headache.  No other specific complaints in a complete review of systems (except as listed in HPI above).  Objective  Vitals:   08/21/18 0905  BP: 110/70  Pulse: 86  Resp: 16  Temp: 98.3 F (36.8 C)  TempSrc: Oral  SpO2: 99%  Weight: 154 lb 6.4 oz (70 kg)  Height: _0  (1.6 m)   Body mass index is 27.35 kg/m.  Physical Exam Constitutional: Patient appears well-developed and well-nourished. No distress.  HENT: Head: Normocephalic and atraumatic. Ears: bilateral TMs with no erythema or effusion; Nose: Nose normal. Mouth/Throat: Oropharynx is clear and moist. No oropharyngeal exudate or tonsillar swelling.  Eyes: Conjunctivae and EOM are normal. No scleral icterus. Neck: Normal range of motion. Neck supple. No JVD present. No thyromegaly present.  Cardiovascular: Normal rate, regular rhythm and normal heart sounds.  No murmur heard. No BLE edema. Pulmonary/Chest: Effort normal and breath sounds normal. No respiratory distress. Musculoskeletal: Normal range of motion, no joint effusions. No gross deformities Neurological: Pt is alert and oriented to person, place, and time. No cranial nerve deficit. Coordination, balance, strength, speech and gait are normal.  Skin: Erythematous matte rash with telangectasia and petechia and no raised lesions in a malar distribution, along the upper chest, bilateral forearms, and bilateral shin, all of which is non-blanching. Psychiatric: Patient has a normal mood and affect. behavior is normal. Judgment and thought content normal.  Results for orders placed or performed in visit on 08/18/18 (from the past 72 hour(s))    Phosphorus     Status: None   Collection Time: 08/18/18  2:44 PM  Result Value Ref Range   Phosphorus 4.0 2.5 - 4.6 mg/dL    Comment: Performed at [facility], [address], [place], [place]  [address]  Magnesium     Status: None   Collection Time: 08/18/18  2:44 PM  Result Value Ref Range   Magnesium 2.1 1.7 - 2.4 mg/dL    Comment: Performed at [facility], [address] [place], [place] [address]  Comprehensive metabolic panel     Status: Abnormal   Collection Time: 08/18/18  2:44 PM  Result Value Ref Range   Sodium 141 135 - 145 mmol/L   Potassium 4.3 3.5 - 5.1 mmol/L   Chloride 106 98 - 111 mmol/L   CO2 28 22 - 32 mmol/L   Glucose, Bld 87 70 - 99 mg/dL   BUN 28 (H) 6 - 20 mg/dL   Creatinine, Ser 0.87 0.44 - 1.00 mg/dL   Calcium 8.8 (L) 8.9 - 10.3 mg/dL   Total Protein 6.4 (L) 6.5 - 8.1 g/dL   Albumin 4.1 3.5 - 5.0 g/dL   AST 19 15 - 41 U/L   ALT 15 0 - 44 U/L   Alkaline Phosphatase 47 38 - 126 U/L   Total Bilirubin 0.8 0.3 - 1.2 mg/dL   GFR calc non Af Amer >60 >60 mL/min   GFR calc Af Amer >60 >60 mL/min   Anion gap 7 5 - 15    Comment: Performed at [facility], [address] [place], [place] [address]  CBC with Differential/Platelet     Status: Abnormal   Collection Time: 08/18/18  2:44 PM  Result Value Ref Range   WBC 19.9 (H) 4.0 - 10.5 K/uL   RBC 3.76 (L) 3.87 - 5.11 MIL/uL   Hemoglobin 12.3 12.0 - 15.0 g/dL   HCT 37.2 36.0 - 46.0 %   MCV 98.9 80.0 - 100.0 fL   MCH 32.7 26.0 - 34.0 pg   MCHC 33.1 30.0 - 36.0 g/dL   RDW 12.7 11.5 - 15.5 %   Platelets 154 150 - 400 K/uL   nRBC 0.0 0.0 - 0.2 %   Neutrophils Relative % 8 %   Neutro Abs 1.7 1.7 - 7.7 K/uL   Lymphocytes Relative 88 %   Lymphs Abs 17.5 (H) 0.7 - 4.0 K/uL   Monocytes Relative 3 %   Monocytes Absolute 0.5 0.1 - 1.0 K/uL   Eosinophils Relative 0 %   Eosinophils Absolute 0.0 0.0 - 0.5 K/uL   Basophils Relative 1 %   Basophils Absolute 0.1 0.0 - 0.1 K/uL   WBC  Morphology ABSOLUTE LYMPHOCYTOSIS     Comment: SMUDGE CELLS   Smear Review SMEAR SCANNED    Immature Granulocytes 0 %   Abs Immature Granulocytes 0.05 0.00 - 0.07 K/uL    Comment: Performed at [facility], [address] [place], [place] [address]    PHQ2/9: Depression screen [facility] 2/9 08/21/2018 07/01/2018 06/11/2018 06/03/2018 05/13/2018  Decreased Interest 0 0 0 0 0  Down, Depressed, Hopeless 0 0 0 0 0  PHQ - 2 Score 0 0 0 0 0  Altered sleeping 0 - - - -  Tired, decreased energy 0 - - - -  Change in appetite 0 - - - -  Feeling bad or failure about yourself  0 - - - -  Trouble concentrating 0 - - - -  Moving slowly or fidgety/restless 0 - - - -  Suicidal thoughts 0 - - - -  PHQ-9 Score 0 - - - -  Difficult doing work/chores Not difficult at all - - - -   Fall Risk: Fall Risk  08/21/2018  07/01/2018 06/11/2018 06/03/2018 05/13/2018  Falls in the past year? 0 No No No No  Number falls in past yr: 0 - - - -  Injury with Fall? 0 - - - -    Assessment & Plan  1. Butterfly rash - Sed Rate (ESR) - C-reactive protein - ANA - LUPUS(12) PANEL - Ambulatory referral to Dermatology - pt to see Dermatology today; ?vasculitis vs drug reaction vs other etiology.  2. Rash and nonspecific skin eruption - Sed Rate (ESR) - C-reactive protein - ANA - LUPUS(12) PANEL - Ambulatory referral to Dermatology  3. Trigeminal neuralgia of right side of face - Stable  4. Scalp cyst - Stable, no changes  5. CLL (chronic lymphocytic leukemia) ([place]) - Keep follow up with [name]  6. Pulmonary emphysema, unspecified emphysema type ([place]) - Stable  7. Tobacco use - Back up to 1ppd.

## 2018-08-22 ENCOUNTER — Other Ambulatory Visit: Payer: Self-pay | Admitting: Family Medicine

## 2018-08-22 DIAGNOSIS — J439 Emphysema, unspecified: Secondary | ICD-10-CM

## 2018-08-24 LAB — LUPUS(12) PANEL
Anti Nuclear Antibody(ANA): NEGATIVE
C3 Complement: 94 mg/dL (ref 83–193)
C4 Complement: 20 mg/dL (ref 15–57)
DS DNA AB: 1 [IU]/mL
ENA SM Ab Ser-aCnc: <1 AI
Rhuematoid fact SerPl-aCnc: <14 IU/mL (ref <14)
Ribosomal P Protein Ab: <1 AI
SM/RNP: <1 AI
SSA (RO) (ENA) ANTIBODY, IGG: NEGATIVE AI
SSB (La) (ENA) Antibody, IgG: <1 AI
Scleroderma (Scl-70) (ENA) Antibody, IgG: <1 AI
Thyroperoxidase Ab SerPl-aCnc: 1 IU/mL (ref <9)

## 2018-08-24 LAB — SEDIMENTATION RATE: Sed Rate: 2 mm/h (ref 0–20)

## 2018-08-24 LAB — C-REACTIVE PROTEIN: CRP: 0.7 mg/L (ref <8.0)

## 2018-08-26 ENCOUNTER — Telehealth: Payer: Self-pay | Admitting: Pain Medicine

## 2018-08-26 NOTE — Telephone Encounter (Signed)
Spoke with Dr. Dossie Arbour, instructed to have patient discuss with oncologist. Patient advised to do this.

## 2018-08-26 NOTE — Telephone Encounter (Signed)
Taking Imbreveca, unsure if safe to have steroid injection while taking this.

## 2018-08-26 NOTE — Telephone Encounter (Signed)
Patient lvmail 08-25-18 at 4:06 asking if she can schedule an epidural with a med she is taking. Could not understand the meds she was saying. Please call patient

## 2018-08-26 NOTE — Telephone Encounter (Signed)
She spoke with her oncologist and he approved her getting the procedure.

## 2018-08-27 LAB — MISC LABCORP TEST (SEND OUT)
LABCORP TEST CODE: 113753
LABCORP TEST CODE: 510340

## 2018-09-03 ENCOUNTER — Ambulatory Visit: Payer: BLUE CROSS/BLUE SHIELD | Admitting: Pain Medicine

## 2018-09-03 NOTE — Progress Notes (Deleted)
Patient's Name: Monique [name]  MRN: [id number]  Referring Provider: [name], MD  DOB: May 05, 1971  PCP: [name], MD  DOS: 09/03/2018  Note by: Gaspar Cola, MD  Service setting: Ambulatory outpatient  Specialty: Interventional Pain Management  Patient type: Established  Location: [facility] (AMB) Pain Management Facility  Visit type: Interventional Procedure   Primary Reason for Visit: Interventional Pain Management Treatment. CC: No chief complaint on file.  Procedure #1:  Anesthesia, Analgesia, Anxiolysis:  Type: Therapeutic Trans-Foraminal Epidural Steroid Injection   #2  Region: Lumbar Level: L4 Paravertebral Laterality: Right-Sided Paravertebral   Type: Moderate (Conscious) Sedation combined with Local Anesthesia Indication(s): Analgesia and Anxiety Route: Intravenous (IV) IV Access: Secured Sedation: Meaningful verbal contact was maintained at all times during the procedure  Local Anesthetic: Lidocaine 1-2%  Position: Prone  Procedure #2:    Type: Therapeutic Inter-Laminar Epidural Steroid Injection   #2  Region: Lumbar Level: L3-4 Level. Laterality: Right-Sided Paramedial     Indications: 1. DDD (degenerative disc disease), lumbar   2. Chronic lower extremity pain (Primary Area of Pain) (Bilateral) (R>L)   3. Chronic low back pain (Secondary Area of Pain) (Bilateral) (R>L) w/ sciatica (Bilateral)    Pain Score: Pre-procedure:  /10 Post-procedure:  /10  Pre-op Assessment:  Monique [name] is a 47 y.o. (year old), female patient, seen today for interventional treatment. She  has a past surgical history that includes Cervical biopsy w/ loop electrode excision; Tubal ligation; Breast biopsy (Right, 01/05/2018); and OTHER SURGICAL HISTORY. Monique [name] has a current medication list which includes the following prescription(s): anoro ellipta, gnp calcium 1200, vitamin d3, gabapentin, ibrutinib, magnesium, meloxicam, and tizanidine. Her primarily concern today is the  No chief complaint on file.  Initial Vital Signs:  Pulse/HCG Rate:    Temp:   Resp:   BP:   SpO2:    BMI: Estimated body mass index is 27.35 kg/m as calculated from the following:   Height as of 08/21/18: 5\' 3"  (1.6 m).   Weight as of 08/21/18: 154 lb 6.4 oz (70 kg).  Risk Assessment: Allergies: Reviewed. She has No Known Allergies.  Allergy Precautions: None required Coagulopathies: Reviewed. None identified.  Blood-thinner therapy: None at this time Active Infection(s): Reviewed. None identified. Monique [name] is afebrile  Site Confirmation: Monique [name] was asked to confirm the procedure and laterality before marking the site Procedure checklist: Completed Consent: Before the procedure and under the influence of no sedative(s), amnesic(s), or anxiolytics, the patient was informed of the treatment options, risks and possible complications. To fulfill our ethical and legal obligations, as recommended by the American Medical Association's Code of Ethics, I have informed the patient of my clinical impression; the nature and purpose of the treatment or procedure; the risks, benefits, and possible complications of the intervention; the alternatives, including doing nothing; the risk(s) and benefit(s) of the alternative treatment(s) or procedure(s); and the risk(s) and benefit(s) of doing nothing. The patient was provided information about the general risks and possible complications associated with the procedure. These may include, but are not limited to: failure to achieve desired goals, infection, bleeding, organ or nerve damage, allergic reactions, paralysis, and death. In addition, the patient was informed of those risks and complications associated to Spine-related procedures, such as failure to decrease pain; infection (i.e.: Meningitis, epidural or intraspinal abscess); bleeding (i.e.: epidural hematoma, subarachnoid hemorrhage, or any other type of intraspinal or peri-dural bleeding); organ or  nerve damage (i.e.: Any type of peripheral nerve, nerve root, or spinal  cord injury) with subsequent damage to sensory, motor, and/or autonomic systems, resulting in permanent pain, numbness, and/or weakness of one or several areas of the body; allergic reactions; (i.e.: anaphylactic reaction); and/or death. Furthermore, the patient was informed of those risks and complications associated with the medications. These include, but are not limited to: allergic reactions (i.e.: anaphylactic or anaphylactoid reaction(s)); adrenal axis suppression; blood sugar elevation that in diabetics may result in ketoacidosis or comma; water retention that in patients with history of congestive heart failure may result in shortness of breath, pulmonary edema, and decompensation with resultant heart failure; weight gain; swelling or edema; medication-induced neural toxicity; particulate matter embolism and blood vessel occlusion with resultant organ, and/or nervous system infarction; and/or aseptic necrosis of one or more joints. Finally, the patient was informed that Medicine is not an exact science; therefore, there is also the possibility of unforeseen or unpredictable risks and/or possible complications that may result in a catastrophic outcome. The patient indicated having understood very clearly. We have given the patient no guarantees and we have made no promises. Enough time was given to the patient to ask questions, all of which were answered to the patient's satisfaction. Monique [name] has indicated that she wanted to continue with the procedure. Attestation: I, the ordering provider, attest that I have discussed with the patient the benefits, risks, side-effects, alternatives, likelihood of achieving goals, and potential problems during recovery for the procedure that I have provided informed consent. Date  Time: {CHL [facility]-PAIN TIME CHOICES:21018001}  Pre-Procedure Preparation:  Monitoring: As per clinic protocol.  Respiration, ETCO2, SpO2, BP, heart rate and rhythm monitor placed and checked for adequate function Safety Precautions: Patient was assessed for positional comfort and pressure points before starting the procedure. Time-out: I initiated and conducted the "Time-out" before starting the procedure, as per protocol. The patient was asked to participate by confirming the accuracy of the "Time Out" information. Verification of the correct person, site, and procedure were performed and confirmed by me, the nursing staff, and the patient. "Time-out" conducted as per Joint Commission's Universal Protocol (UP.01.01.01). Time:    Description of Procedure #1:  Target Area: The inferior and lateral portion of the pedicle, just lateral to a line created by the 6:00 position of the pedicle and the superior articular process of the vertebral body below. On the lateral view, this target lies just posterior to the anterior aspect of the lamina and posterior to the midpoint created between the anterior and the posterior aspect of the neural foramina. Approach: Posterior paravertebral approach. Area Prepped: Entire Posterior Lumbosacral Region Prepping solution: ChloraPrep (2% chlorhexidine gluconate and 70% isopropyl alcohol) Safety Precautions: Aspiration looking for blood return was conducted prior to all injections. At no point did we inject any substances, as a needle was being advanced. No attempts were made at seeking any paresthesias. Safe injection practices and needle disposal techniques used. Medications properly checked for expiration dates. SDV (single dose vial) medications used.  Description of the Procedure: Protocol guidelines were followed. The patient was placed in position over the fluoroscopy table. The target area was identified and the area prepped in the usual manner. Skin & deeper tissues infiltrated with local anesthetic. Appropriate amount of time allowed to pass for local anesthetics to take  effect. The procedure needles were then advanced to the target area. Proper needle placement secured. Negative aspiration confirmed. Solution injected in intermittent fashion, asking for systemic symptoms every 0.2cc of injectate. The needles were then removed and the area cleansed, making  sure to leave some of the prepping solution back to take advantage of its long term bactericidal properties.  Start Time:   hrs.  Materials:  Needle(s) Type: Spinal Needle Gauge: 22G Length: 3.5-in Medication(s): Please see orders for medications and dosing details.  Description of Procedure #2:  Target Area: The  interlaminar space, initially targeting the lower border of the superior vertebral body lamina. Approach: Posterior paramedial approach. Area Prepped: Same as above Prepping solution: Same as above Safety Precautions: Same as above  Description of the Procedure: Protocol guidelines were followed. The patient was placed in position over the fluoroscopy table. The target area was identified and the area prepped in the usual manner. Skin & deeper tissues infiltrated with local anesthetic. Appropriate amount of time allowed to pass for local anesthetics to take effect. The procedure needle was introduced through the skin, ipsilateral to the reported pain, and advanced to the target area. Bone was contacted and the needle walked caudad, until the lamina was cleared. The ligamentum flavum was engaged and loss-of-resistance technique used as the epidural needle was advanced. The epidural space was identified using "loss-of-resistance technique" with 2-3 ml of PF-NaCl (0.9% NSS), in a 5cc LOR glass syringe. Proper needle placement secured. Negative aspiration confirmed. Solution injected in intermittent fashion, asking for systemic symptoms every 0.5cc of injectate. The needles were then removed and the area cleansed, making sure to leave some of the prepping solution back to take advantage of its long term  bactericidal properties.  There were no vitals filed for this visit.  End Time:   hrs.  Materials:  Needle(s) Type: Epidural needle Gauge: 17G Length: 3.5-in Medication(s): Please see orders for medications and dosing details.  Imaging Guidance (Spinal):          Type of Imaging Technique: Fluoroscopy Guidance (Spinal) Indication(s): Assistance in needle guidance and placement for procedures requiring needle placement in or near specific anatomical locations not easily accessible without such assistance. Exposure Time: Please see nurses notes. Contrast: Before injecting any contrast, we confirmed that the patient did not have an allergy to iodine, shellfish, or radiological contrast. Once satisfactory needle placement was completed at the desired level, radiological contrast was injected. Contrast injected under live fluoroscopy. No contrast complications. See chart for type and volume of contrast used. Fluoroscopic Guidance: I was personally present during the use of fluoroscopy. "Tunnel Vision Technique" used to obtain the best possible view of the target area. Parallax error corrected before commencing the procedure. "Direction-depth-direction" technique used to introduce the needle under continuous pulsed fluoroscopy. Once target was reached, antero-posterior, oblique, and lateral fluoroscopic projection used confirm needle placement in all planes. Images permanently stored in EMR. Interpretation: I personally interpreted the imaging intraoperatively. Adequate needle placement confirmed in multiple planes. Appropriate spread of contrast into desired area was observed. No evidence of afferent or efferent intravascular uptake. No intrathecal or subarachnoid spread observed. Permanent images saved into the patient's record.  Antibiotic Prophylaxis:   Anti-infectives (From admission, onward)   None     Indication(s): None identified  Post-operative Assessment:  Post-procedure Vital  Signs:  Pulse/HCG Rate:    Temp:   Resp:   BP:   SpO2:    EBL: None  Complications: No immediate post-treatment complications observed by team, or reported by patient.  Note: The patient tolerated the entire procedure well. A repeat set of vitals were taken after the procedure and the patient was kept under observation following institutional policy, for this type of procedure. Post-procedural neurological assessment  was performed, showing return to baseline, prior to discharge. The patient was provided with post-procedure discharge instructions, including a section on how to identify potential problems. Should any problems arise concerning this procedure, the patient was given instructions to immediately contact us, at any time, without hesitation. In any case, we plan to contact the patient by telephone for a follow-up status report regarding this interventional procedure.  Comments:  No additional relevant information.  Plan of Care  Interventional management options: Planned, scheduled, and/or pending:   Therapeutic right L4 transforaminal ESI #2 + right L3-4 LESI #2under fluoroscopic guidance and IV sedation, today   Considering:   Diagnostic/therapeutic right-sided L3-4 LESI #2  Diagnostic/therapeutic right-sided L4 TFESI #2  Diagnostic bilateral lumbar facet nerve block #1 Possible bilateral lumbar facet RFA   Palliative PRN treatment(s):   Palliative right L4 transforaminal ESI + right L3-4 LESIunder fluoroscopic guidance and IV sedation   Imaging Orders  No imaging studies ordered today   Procedure Orders    No procedure(s) ordered today    Medications ordered for procedure: No orders of the defined types were placed in this encounter.  Medications administered: Mccayla L. Cortese had no medications administered during this visit.  See the medical record for exact dosing, route, and time of administration.  Disposition: Discharge home  Discharge Date & Time:  09/03/2018;   hrs.   Physician-requested Follow-up: No follow-ups on file.  Future Appointments  Date Time Provider [place]  09/03/2018 12:30 PM [name], MD [facility] None  09/24/2018 11:15 AM [facility] None  10/22/2018  8:45 AM [facility] None  10/22/2018 10:00 AM [facility]  10/29/2018  3:30 PM Grayland Ormond, Kathlene November, MD [facility] None  12/23/2018  8:40 AM [name], MD [facility]  12/31/2018  9:45 AM Vevelyn Francois, NP [facility] None   Primary Care Physician: [name], MD Location: [facility] Note by: Gaspar Cola, MD Date: 09/03/2018; Time: 8:03 AM  Disclaimer:  Medicine is not an Chief Strategy Officer. The only guarantee in medicine is that nothing is guaranteed. It is important to note that the decision to proceed with this intervention was based on the information collected from the patient. The Data and conclusions were drawn from the patient's questionnaire, the interview, and the physical examination. Because the information was provided in large part by the patient, it cannot be guaranteed that it has not been purposely or unconsciously manipulated. Every effort has been made to obtain as much relevant data as possible for this evaluation. It is important to note that the conclusions that lead to this procedure are derived in large part from the available data. Always take into account that the treatment will also be dependent on availability of resources and existing treatment guidelines, considered by other Pain Management Practitioners as being common knowledge and practice, at the time of the intervention. For Medico-Legal purposes, it is also important to point out that variation in procedural techniques and pharmacological choices are the acceptable norm. The indications, contraindications, technique, and results of the above procedure should only be interpreted and judged by a  Board-Certified Interventional Pain Specialist with extensive familiarity and expertise in the same exact procedure and technique.

## 2018-09-08 ENCOUNTER — Other Ambulatory Visit: Payer: Self-pay | Admitting: Family Medicine

## 2018-09-08 DIAGNOSIS — J439 Emphysema, unspecified: Secondary | ICD-10-CM

## 2018-09-10 ENCOUNTER — Telehealth: Payer: Self-pay

## 2018-09-10 ENCOUNTER — Ambulatory Visit (INDEPENDENT_AMBULATORY_CARE_PROVIDER_SITE_OTHER): Payer: BLUE CROSS/BLUE SHIELD | Admitting: Family Medicine

## 2018-09-10 ENCOUNTER — Encounter: Payer: Self-pay | Admitting: Family Medicine

## 2018-09-10 VITALS — BP 118/64 | HR 79 | Temp 97.5°F | Resp 16 | Ht 63.0 in | Wt 160.8 lb

## 2018-09-10 DIAGNOSIS — J439 Emphysema, unspecified: Secondary | ICD-10-CM | POA: Diagnosis not present

## 2018-09-10 DIAGNOSIS — R0602 Shortness of breath: Secondary | ICD-10-CM | POA: Diagnosis not present

## 2018-09-10 DIAGNOSIS — M25461 Effusion, right knee: Secondary | ICD-10-CM | POA: Diagnosis not present

## 2018-09-10 DIAGNOSIS — M25561 Pain in right knee: Secondary | ICD-10-CM | POA: Diagnosis not present

## 2018-09-10 NOTE — Progress Notes (Signed)
Name: Monique [name]   MRN: [id number]    DOB: 1971-04-25   Date:09/10/2018       Progress Note  Subjective  Chief Complaint  Chief Complaint  Patient presents with  . Edema    Onset-2 days ago, swelling has gone down due to being off of her feet and heat. But states she two pockets of edema on right knee and one on left side-pulling sensation behind her kneecap.  Marland Kitchen Shortness of Breath    Notices if she lays down and has her grandson on her chest she can't breath-She does have COPD and taking Anoro.  . Knee Pain    States while at work her knees locked up on her and gave her pain where she could not stand up yesterday and having shooting pain from her knee and radating to her groin area.     HPI  RIGHT Knee - She notes new onset of right knee swelling (lateral) 3 days ago.  She noticed some swelling in the popliteal fossa at that time too but this has since resolved.  Yesterday she had an episode of her knee "locking" up for a few seconds.  She has noticed improvement in the swelling over the last 24 hours since her knee locked up.  She endorses ongoing chronic RLE weakness that is at baseline for her.  She does see pain management for her chronic back pain. Applying heat and taking tylenol PRN.   Shortness of breath:  She notes feeling like she can't breath when laying flat with her grandson on her chest, and during sexual intercourse if she is flat on her back.  She does note very mild swelling in BLE that goes away at night.  She denies having to prop herself up on pillows at night.  She does have emphysema. She is ready to quit but insurance won't cover another Chantix Rx until September.  Patient Active Problem List   Diagnosis Date Noted  . Hypocalcemia 06/03/2018  . Vitamin D insufficiency 06/03/2018  . Abnormal MRI, lumbar spine (04/20/2017) 06/03/2018  . Chronic hip pain (Right) 06/03/2018  . Spondylosis without myelopathy or radiculopathy, lumbar region 06/03/2018  . DDD  (degenerative disc disease), lumbar 06/02/2018  . Lumbar facet hypertrophy 06/02/2018  . Lumbar facet arthropathy 06/02/2018  . Lumbar facet syndrome (Bilateral) (R>L) 06/02/2018  . Marijuana use 05/19/2018  . Chronic lower extremity pain (Primary Area of Pain) (Bilateral) (R>L) 05/13/2018  . Chronic low back pain (Secondary Area of Pain) (Bilateral) (R>L) w/ sciatica (Bilateral) 05/13/2018  . Chronic pain syndrome 05/13/2018  . Opiate use 05/13/2018  . Disorder of skeletal system 05/13/2018  . Pharmacologic therapy 05/13/2018  . Problems influencing health status 05/13/2018  . Mastalgia 05/05/2018  . Breast mass, right 01/07/2018  . Facial pain 12/11/2017  . Tingling 12/11/2017  . Thoracic aortic atherosclerosis ([place]) 08/20/2017  . Emphysema of lung ([place]) 08/20/2017  . Adductor tendinitis 04/23/2017  . Trochanteric bursitis of right hip 04/23/2017  . GERD without esophagitis 12/11/2016  . CLL (chronic lymphocytic leukemia) ([place]) 11/24/2016  . Pap smear abnormality of cervix/human papillomavirus (HPV) positive 09/12/2016  . Stress incontinence 09/04/2016  . B12 deficiency 07/10/2015  . Insomnia, persistent 07/10/2015  . Mild episode of recurrent major depressive disorder ([place]) 07/10/2015  . Anorexia nervosa, restricting type 07/10/2015  . Anxiety, generalized 07/10/2015  . H/O suicide attempt 07/10/2015  . Lymphocytosis 07/10/2015  . Obsessive-compulsive disorder 07/10/2015  . Tobacco use 07/10/2015  . History of cervical  dysplasia 07/18/2014    Social History   Tobacco Use  . Smoking status: Current Every Day Smoker    Packs/day: 1.00    Years: 31.00    Pack years: 31.00    Start date: 09/25/1986  . Smokeless tobacco: Never Used  Substance Use Topics  . Alcohol use: Yes    Alcohol/week: 0.0 standard drinks    Comment: rarely     Current Outpatient Medications:  .  ANORO ELLIPTA 62.5-25 MCG/INH AEPB, INHALE 1 PUFF INTO THE LUNGS EVERY DAY, Disp: 60 each, Rfl: 0 .   Calcium Carbonate-Vit D-Min (GNP CALCIUM 1200) 1200-1000 MG-UNIT CHEW, Chew 1,200 mg by mouth daily with breakfast. Take in combination with vitamin D and magnesium., Disp: 30 tablet, Rfl: 5 .  Cholecalciferol (VITAMIN D3) 5000 units CAPS, Take 1 capsule (5,000 Units total) by mouth daily with breakfast. Take along with calcium and magnesium., Disp: 30 capsule, Rfl: 5 .  gabapentin (NEURONTIN) 100 MG capsule, Take 100 mg by mouth 2 (two) times daily. , Disp: , Rfl:  .  Ibrutinib 420 MG TABS, Take 420 mg by mouth daily., Disp: 28 tablet, Rfl: 2 .  Magnesium 500 MG CAPS, Take 1 capsule (500 mg total) by mouth 2 (two) times daily at 8 am and 10 pm., Disp: 60 capsule, Rfl: 5 .  meloxicam (MOBIC) 15 MG tablet, Take 1 tablet (15 mg total) by mouth daily., Disp: 180 tablet, Rfl: 0 .  tiZANidine (ZANAFLEX) 2 MG tablet, Take 1 tablet (2 mg total) by mouth every 8 (eight) hours as needed for muscle spasms., Disp: 90 tablet, Rfl: 5  No Known Allergies  I personally reviewed active problem list, medication list, allergies, notes from last encounter, lab results with the patient/caregiver today.  ROS  Constitutional: Negative for fever or weight change.  Respiratory: Negative for cough and positive shortness of breath.   Cardiovascular: Negative for chest pain or palpitations.  Gastrointestinal: Negative for abdominal pain, no bowel changes.  Musculoskeletal: Negative for gait problem or joint swelling.  Skin: Negative for rash.  Neurological: Negative for dizziness or headache.  No other specific complaints in a complete review of systems (except as listed in HPI above).   Objective  Vitals:   09/10/18 1041  BP: 118/64  Pulse: 79  Resp: 16  Temp: (!) 97.5 F (36.4 C)  TempSrc: Oral  SpO2: 99%  Weight: 160 lb 12.8 oz (72.9 kg)  Height: 5\' 3"  (1.6 m)   Body mass index is 28.48 kg/m.  Nursing Note and Vital Signs reviewed.  Physical Exam  Constitutional: Patient appears well-developed  and well-nourished. No distress.  HENT: Head: Normocephalic and atraumatic.  Nose: Nose normal. Mouth/Throat: Oropharynx is clear and moist. No oropharyngeal exudate or tonsillar swelling.  Eyes: Conjunctivae and EOM are normal. No scleral icterus.  Pupils are equal, round, and reactive to light.  Neck: Normal range of motion. Neck supple. No JVD present. No thyromegaly present.  Cardiovascular: Normal rate, regular rhythm and normal heart sounds.  No murmur heard. No BLE edema. Pulmonary/Chest: Effort normal and breath sounds normal. No respiratory distress. Abdominal: Soft. Bowel sounds are normal, no distension. There is no tenderness. No masses. Musculoskeletal: Normal range of motion, mild swelling to the right lateral knee that is minimally tender without erythema. No gross deformities. Normal popliteal fossa.  Neurological: Pt is alert and oriented to person, place, and time. No cranial nerve deficit. Coordination, balance, strength, speech and gait are normal.  Skin: Skin is warm and dry.  No rash noted. No erythema.  Psychiatric: Patient has a normal mood and affect. behavior is normal. Judgment and thought content normal.  No results found for this or any previous visit (from the past 72 hour(s)).  Assessment & Plan  1. Pain and swelling of right knee - Improving course, monitor, apply ice/heat and elevate, we will keep her out of work for the next 3-4 days because she is on her feet constantly while working at [employer].  If not improving in 3-4 days, she will return for further evaluation.  2. Shortness of breath - Advised likely related to emphysema, however we will check BNP to be sure no concern for HF.  If BNP negative, will consider switching her to [facility] or Trelegy for better control of symptoms. - B Nat Peptide  3. Pulmonary emphysema, unspecified emphysema type ([place]) - Suspect ShOB to be COPD related, if  -Red flags and when to present for emergency care or RTC including  fever >101.28F, chest pain, shortness of breath, new/worsening/un-resolving symptoms, reviewed with patient at time of visit. Follow up and care instructions discussed and provided in AVS.

## 2018-09-11 ENCOUNTER — Other Ambulatory Visit: Payer: Self-pay | Admitting: Family Medicine

## 2018-09-11 DIAGNOSIS — J439 Emphysema, unspecified: Secondary | ICD-10-CM

## 2018-09-11 LAB — BRAIN NATRIURETIC PEPTIDE: Brain Natriuretic Peptide: 28 pg/mL (ref <100)

## 2018-09-11 MED ORDER — FLUTICASONE-UMECLIDIN-VILANT 100-62.5-25 MCG/INH IN AEPB: 1.0000 | INHALATION_SPRAY | Freq: Every day | RESPIRATORY_TRACT | 3 refills | Status: DC

## 2018-09-14 ENCOUNTER — Telehealth: Payer: Self-pay | Admitting: *Deleted

## 2018-09-14 ENCOUNTER — Ambulatory Visit: Payer: Self-pay

## 2018-09-14 DIAGNOSIS — J069 Acute upper respiratory infection, unspecified: Secondary | ICD-10-CM | POA: Diagnosis not present

## 2018-09-14 DIAGNOSIS — R21 Rash and other nonspecific skin eruption: Secondary | ICD-10-CM | POA: Diagnosis not present

## 2018-09-14 MED FILL — IMBRUVICA 420 MG TAB: 420 | 28 days supply | Qty: 28 | Fill #2

## 2018-09-14 NOTE — Telephone Encounter (Signed)
Patient called reporting that she is congested, runny nose, and coughing. She also reports that her PCP took her out of work for stiffness and pain in her legs bilateral form her buttocks to her knees. She reports that she is on [place]. Please advise

## 2018-09-14 NOTE — Telephone Encounter (Signed)
She needs to go to UC as directed by her oncology team.

## 2018-09-14 NOTE — Telephone Encounter (Signed)
Patient denies fever, advised to go to Urgent Care for evaluation as she reports feeling so bad today, she reports that she has purple dots on her arms and legs today. She has agreed to go to Urgent Care

## 2018-09-14 NOTE — Telephone Encounter (Signed)
Pt. Reports she started having cough and sinus congestion 3 days ago. Wants to know what she can take for it while she is on Ibrutinib. Also needs a note for work today.She has noticed petechiae on her left arm. She states the ones on her face have gone. Please advise pt. Contact number [phone].  Answer Assessment - Initial Assessment Questions 1. ONSET: "When did the cough begin?"        3 days ago 2. SEVERITY: "How bad is the cough today?"      Moderate 3. RESPIRATORY DISTRESS: "Describe your breathing."      Shortness of exertion 4. FEVER: "Do you have a fever?" If so, ask: "What is your temperature, how was it measured, and when did it start?"     No 5. HEMOPTYSIS: "Are you coughing up any blood?" If so ask: "How much?" (flecks, streaks, tablespoons, etc.)     No 6. TREATMENT: "What have you done so far to treat the cough?" (e.g., meds, fluids, humidifier)     Nothing 7. CARDIAC HISTORY: "Do you have any history of heart disease?" (e.g., heart attack, congestive heart failure)      No 8. LUNG HISTORY: "Do you have any history of lung disease?"  (e.g., pulmonary embolus, asthma, emphysema)     COPD 9. PE RISK FACTORS: "Do you have a history of blood clots?" (or: recent major surgery, recent prolonged travel, bedridden)     No 10. OTHER SYMPTOMS: "Do you have any other symptoms? (e.g., runny nose, wheezing, chest pain)       Runny nose 11. PREGNANCY: "Is there any chance you are pregnant?" "When was your last menstrual period?"       No 12. TRAVEL: "Have you traveled out of the country in the last month?" (e.g., travel history, exposures)       No  Protocols used: COUGH - ACUTE NON-PRODUCTIVE-A-AH

## 2018-09-14 NOTE — Telephone Encounter (Signed)
Called patient left vm for her to go to UC as directed by her oncology team per PCP.

## 2018-09-14 NOTE — Telephone Encounter (Signed)
Fevers?  Stay on treatment.  Either urgent care today or [facility] tomorrow.

## 2018-09-21 ENCOUNTER — Other Ambulatory Visit: Payer: BLUE CROSS/BLUE SHIELD

## 2018-09-24 ENCOUNTER — Inpatient Hospital Stay: Payer: BLUE CROSS/BLUE SHIELD | Attending: Oncology

## 2018-09-24 ENCOUNTER — Encounter: Payer: Self-pay | Admitting: Pain Medicine

## 2018-09-24 ENCOUNTER — Other Ambulatory Visit: Payer: Self-pay

## 2018-09-24 ENCOUNTER — Ambulatory Visit: Payer: BLUE CROSS/BLUE SHIELD | Attending: Pain Medicine | Admitting: Pain Medicine

## 2018-09-24 ENCOUNTER — Telehealth: Payer: Self-pay | Admitting: *Deleted

## 2018-09-24 VITALS — BP 104/52 | HR 74 | Temp 98.1°F | Resp 16 | Ht 67.0 in | Wt 160.0 lb

## 2018-09-24 DIAGNOSIS — C9110 Chronic lymphocytic leukemia of B-cell type not having achieved remission: Secondary | ICD-10-CM

## 2018-09-24 DIAGNOSIS — M79605 Pain in left leg: Secondary | ICD-10-CM

## 2018-09-24 DIAGNOSIS — M79604 Pain in right leg: Secondary | ICD-10-CM | POA: Insufficient documentation

## 2018-09-24 DIAGNOSIS — M5416 Radiculopathy, lumbar region: Secondary | ICD-10-CM

## 2018-09-24 DIAGNOSIS — M5136 Other intervertebral disc degeneration, lumbar region: Secondary | ICD-10-CM | POA: Insufficient documentation

## 2018-09-24 DIAGNOSIS — G8929 Other chronic pain: Secondary | ICD-10-CM | POA: Insufficient documentation

## 2018-09-24 DIAGNOSIS — M5441 Lumbago with sciatica, right side: Secondary | ICD-10-CM

## 2018-09-24 DIAGNOSIS — M5442 Lumbago with sciatica, left side: Secondary | ICD-10-CM | POA: Insufficient documentation

## 2018-09-24 LAB — COMPREHENSIVE METABOLIC PANEL
ALT: 11 U/L (ref 0–44)
AST: 19 U/L (ref 15–41)
Albumin: 4.1 g/dL (ref 3.5–5.0)
Alkaline Phosphatase: 51 U/L (ref 38–126)
Anion gap: 9 (ref 5–15)
BUN: 23 mg/dL — AB (ref 6–20)
CO2: 26 mmol/L (ref 22–32)
Calcium: 8.9 mg/dL (ref 8.9–10.3)
Chloride: 103 mmol/L (ref 98–111)
Creatinine, Ser: 0.82 mg/dL (ref 0.44–1.00)
GFR calc Af Amer: >60 mL/min (ref >60)
GFR calc non Af Amer: >60 mL/min (ref >60)
Glucose, Bld: 115 mg/dL — ABNORMAL HIGH (ref 70–99)
POTASSIUM: 3.7 mmol/L (ref 3.5–5.1)
Sodium: 138 mmol/L (ref 135–145)
Total Bilirubin: 0.7 mg/dL (ref 0.3–1.2)
Total Protein: 6.9 g/dL (ref 6.5–8.1)

## 2018-09-24 LAB — CBC WITH DIFFERENTIAL/PLATELET
Abs Immature Granulocytes: 0.33 10*3/uL — ABNORMAL HIGH (ref 0.00–0.07)
Basophils Absolute: 0.1 10*3/uL (ref 0.0–0.1)
Basophils Relative: 0 %
EOS PCT: 0 %
Eosinophils Absolute: 0.1 10*3/uL (ref 0.0–0.5)
HEMATOCRIT: 40.9 % (ref 36.0–46.0)
Hemoglobin: 13.2 g/dL (ref 12.0–15.0)
Immature Granulocytes: 1 %
Lymphocytes Relative: 86 %
Lymphs Abs: 23.8 10*3/uL — ABNORMAL HIGH (ref 0.7–4.0)
MCH: 31.2 pg (ref 26.0–34.0)
MCHC: 32.3 g/dL (ref 30.0–36.0)
MCV: 96.7 fL (ref 80.0–100.0)
Monocytes Absolute: 0.8 10*3/uL (ref 0.1–1.0)
Monocytes Relative: 3 %
Neutro Abs: 2.9 10*3/uL (ref 1.7–7.7)
Neutrophils Relative %: 10 %
Platelets: 222 10*3/uL (ref 150–400)
RBC: 4.23 MIL/uL (ref 3.87–5.11)
RDW: 12 % (ref 11.5–15.5)
WBC: 27.8 10*3/uL — ABNORMAL HIGH (ref 4.0–10.5)
nRBC: 0 % (ref 0.0–0.2)

## 2018-09-24 LAB — MAGNESIUM: MAGNESIUM: 1.8 mg/dL (ref 1.7–2.4)

## 2018-09-24 LAB — PHOSPHORUS: Phosphorus: 3.4 mg/dL (ref 2.5–4.6)

## 2018-09-24 MED ORDER — TRIAMCINOLONE ACETONIDE 40 MG/ML IJ SUSP
INTRAMUSCULAR | Status: AC
  Filled 2018-09-24: qty 1

## 2018-09-24 MED ORDER — IOPAMIDOL (ISOVUE-M 200) INJECTION 41%
INTRAMUSCULAR | Status: AC
  Filled 2018-09-24: qty 10

## 2018-09-24 MED ORDER — ROPIVACAINE HCL 2 MG/ML IJ SOLN: 1.0000 mL | Freq: Once | INTRAMUSCULAR | Status: DC

## 2018-09-24 MED ORDER — LIDOCAINE HCL 2 % IJ SOLN: 20.0000 mL | Freq: Once | INTRAMUSCULAR | Status: DC

## 2018-09-24 MED ORDER — DEXAMETHASONE SODIUM PHOSPHATE 10 MG/ML IJ SOLN
INTRAMUSCULAR | Status: AC
  Filled 2018-09-24: qty 1

## 2018-09-24 MED ORDER — MIDAZOLAM HCL 5 MG/5ML IJ SOLN
INTRAMUSCULAR | Status: AC
  Filled 2018-09-24: qty 5

## 2018-09-24 MED ORDER — ROPIVACAINE HCL 2 MG/ML IJ SOLN
INTRAMUSCULAR | Status: AC
  Filled 2018-09-24: qty 10

## 2018-09-24 MED ORDER — FENTANYL CITRATE (PF) 100 MCG/2ML IJ SOLN
INTRAMUSCULAR | Status: AC
  Filled 2018-09-24: qty 2

## 2018-09-24 MED ORDER — TRIAMCINOLONE ACETONIDE 40 MG/ML IJ SUSP: 40.0000 mg | Freq: Once | INTRAMUSCULAR | Status: DC

## 2018-09-24 MED ORDER — LIDOCAINE HCL 2 % IJ SOLN
INTRAMUSCULAR | Status: AC
  Filled 2018-09-24: qty 20

## 2018-09-24 MED ORDER — IOPAMIDOL (ISOVUE-M 200) INJECTION 41%: 10.0000 mL | Freq: Once | INTRAMUSCULAR | Status: DC

## 2018-09-24 MED ORDER — DEXAMETHASONE SODIUM PHOSPHATE 10 MG/ML IJ SOLN: 10.0000 mg | Freq: Once | INTRAMUSCULAR | Status: DC

## 2018-09-24 MED ORDER — FENTANYL CITRATE (PF) 100 MCG/2ML IJ SOLN: 25.0000 ug | INTRAMUSCULAR | Status: DC | PRN

## 2018-09-24 MED ORDER — LACTATED RINGERS IV SOLN: 1000.0000 mL | Freq: Once | INTRAVENOUS | Status: DC

## 2018-09-24 MED ORDER — MIDAZOLAM HCL 5 MG/5ML IJ SOLN: 1.0000 mg | INTRAMUSCULAR | Status: DC | PRN

## 2018-09-24 MED ORDER — SODIUM CHLORIDE (PF) 0.9 % IJ SOLN
INTRAMUSCULAR | Status: AC
  Filled 2018-09-24: qty 10

## 2018-09-24 MED ORDER — SODIUM CHLORIDE 0.9% FLUSH: 2.0000 mL | Freq: Once | INTRAVENOUS | Status: DC

## 2018-09-24 MED ORDER — ROPIVACAINE HCL 2 MG/ML IJ SOLN: 2.0000 mL | Freq: Once | INTRAMUSCULAR | Status: DC

## 2018-09-24 MED ORDER — SODIUM CHLORIDE 0.9% FLUSH: 1.0000 mL | Freq: Once | INTRAVENOUS | Status: DC

## 2018-09-24 NOTE — Progress Notes (Signed)
Patient did not keep NPO status.  She does want to have the procedure done with sedation therefore we will have to reschedule.

## 2018-09-24 NOTE — Telephone Encounter (Signed)
Needing clearance for patient to stop Imbruvica for a week so that she can get injection in her back for her legs. Please send clearance to them. Patient also called about same thing.

## 2018-09-24 NOTE — Telephone Encounter (Signed)
Patient does not need to stop Imbruvica for her injection, but if anesthesia anesthesiology or surgery are hesitant okay to stop for 7 days prior to treatment.

## 2018-09-24 NOTE — Progress Notes (Signed)
Safety precautions to be maintained throughout the outpatient stay will include: orient to surroundings, keep bed in low position, maintain call bell within reach at all times, provide assistance with transfer out of bed and ambulation.  

## 2018-09-24 NOTE — Patient Instructions (Addendum)
____________________________________________________________________________________________  Preparing for Procedure with Sedation  Instructions: . Oral Intake: Do not eat or drink anything for at least 8 hours prior to your procedure. . Transportation: Public transportation is not allowed. Bring an adult driver. The driver must be physically present in our waiting room before any procedure can be started. . Physical Assistance: Bring an adult physically capable of assisting you, in the event you need help. This adult should keep you company at home for at least 6 hours after the procedure. . Blood Pressure Medicine: Take your blood pressure medicine with a sip of water the morning of the procedure. . Blood thinners: Notify our staff if you are taking any blood thinners. Depending on which one you take, there will be specific instructions on how and when to stop it. . Diabetics on insulin: Notify the staff so that you can be scheduled 1st case in the morning. If your diabetes requires high dose insulin, take only  of your normal insulin dose the morning of the procedure and notify the staff that you have done so. . Preventing infections: Shower with an antibacterial soap the morning of your procedure. . Build-up your immune system: Take 1000 mg of Vitamin C with every meal (3 times a day) the day prior to your procedure. . Antibiotics: Inform the staff if you have a condition or reason that requires you to take antibiotics before dental procedures. . Pregnancy: If you are pregnant, call and cancel the procedure. . Sickness: If you have a cold, fever, or any active infections, call and cancel the procedure. . Arrival: You must be in the facility at least 30 minutes prior to your scheduled procedure. . Children: Do not bring children with you. . Dress appropriately: Bring dark clothing that you would not mind if they get stained. . Valuables: Do not bring any jewelry or valuables.  Procedure  appointments are reserved for interventional treatments only. . No Prescription Refills. . No medication changes will be discussed during procedure appointments. . No disability issues will be discussed.  Reasons to call and reschedule or cancel your procedure: (Following these recommendations will minimize the risk of a serious complication.) . Surgeries: Avoid having procedures within 2 weeks of any surgery. (Avoid for 2 weeks before or after any surgery). . Flu Shots: Avoid having procedures within 2 weeks of a flu shots or . (Avoid for 2 weeks before or after immunizations). . Barium: Avoid having a procedure within 7-10 days after having had a radiological study involving the use of radiological contrast. (Myelograms, Barium swallow or enema study). . Heart attacks: Avoid any elective procedures or surgeries for the initial 6 months after a "Myocardial Infarction" (Heart Attack). . Blood thinners: It is imperative that you stop these medications before procedures. Let us know if you if you take any blood thinner.  . Infection: Avoid procedures during or within two weeks of an infection (including chest colds or gastrointestinal problems). Symptoms associated with infections include: Localized redness, fever, chills, night sweats or profuse sweating, burning sensation when voiding, cough, congestion, stuffiness, runny nose, sore throat, diarrhea, nausea, vomiting, cold or Flu symptoms, recent or current infections. It is specially important if the infection is over the area that we intend to treat. . Heart and lung problems: Symptoms that may suggest an active cardiopulmonary problem include: cough, chest pain, breathing difficulties or shortness of breath, dizziness, ankle swelling, uncontrolled high or unusually low blood pressure, and/or palpitations. If you are experiencing any of these symptoms, cancel   your procedure and contact your primary care physician for an evaluation.  Remember:   Regular Business hours are:  Monday to Thursday 8:00 AM to 4:00 PM  Provider's Schedule: [name], MD:  Procedure days: Tuesday and Thursday 7:30 AM to 4:00 PM  Gillis Santa, MD:  Procedure days: Monday and Wednesday 7:30 AM to 4:00 PM ____________________________________________________________________________________________   ____________________________________________________________________________________________  Blood Thinners  Recommended Time Interval Before and After Neuraxial Block or Catheter Removal  Drug (Generic) Brand Name Time Before Time After Comments  Abciximab Reopro 15 days 2 hours   Alteplase Activase 10 days 10 days   Apixaban Eliquis 3 days 6 hours   Aspirin > 325 mg Goody Powders/Excedrin 11 days  (Usually not stopped)  Aspirin ? 81 mg  7 days  (Usually not stopped)  Cholesterol Medication Lipitor 4 days    Cilostazol Pletal 3 days 5 hours   Clopidogrel Plavix 7-10 days 2 hours   Dabigatran Pradaxa 5 days 6 hours   Delteparin Fragmin 24 hours 4 hours   Dipyridamole + ASA Aggrenox 11days 2 hours   Enoxaparin  Lovenox 24 hours 4 hours   Eptifibatide Integrillin 8 hours 2 hours   Fish oil  4 days    Fondaparinux  Arixtra 72 hours 12 hours   Garlic supplements  7 days    Ginkgo biloba  36 hours    Ginseng  24 hours    Heparin (IV)  4 hours 2 hours   Heparin ([place])  12 hours 2 hours   Hydroxychloroquine Plaquenil 11 days    LMW Heparin  24 hours    LMWH  24 hours    NSAIDs  3 days  (Usually not stopped)  Prasugrel Effient 7-10 days 6 hours   Reteplase Retavase 10 days 10 days   Rivaroxaban Xarelto 3 days 6 hours   Streptokinase Streptase 10 days 10 days   Tenecteplase TNKase 10 days 10 days   Thrombolytics  10 days  10 days Avoid x 10 days after inj.  Ticagrelor Brilinta 5-7 days 6 hours   Ticlodipine Ticlid 10-14 days 2 hours   Tinzaparin Innohep 24 hours 4 hours   Tirofiban Aggrastat 8 hours 2 hours   Vitamin E  4 days    Warfarin  Coumadin 5 days 2 hours   ____________________________________________________________________________________________  Epidural Steroid Injection Patient Information  Description: The epidural space surrounds the nerves as they exit the spinal cord.  In some patients, the nerves can be compressed and inflamed by a bulging disc or a tight spinal canal (spinal stenosis).  By injecting steroids into the epidural space, we can bring irritated nerves into direct contact with a potentially helpful medication.  These steroids act directly on the irritated nerves and can reduce swelling and inflammation which often leads to decreased pain.  Epidural steroids may be injected anywhere along the spine and from the neck to the low back depending upon the location of your pain.   After numbing the skin with local anesthetic (like Novocaine), a small needle is passed into the epidural space slowly.  You may experience a sensation of pressure while this is being done.  The entire block usually last less than 10 minutes.  Conditions which may be treated by epidural steroids:   Low back and leg pain  Neck and arm pain  Spinal stenosis  Post-laminectomy syndrome  Herpes zoster (shingles) pain  Pain from compression fractures  Preparation for the injection:  1. Do not eat any solid  food or dairy products within 8 hours of your appointment.  2. You may drink clear liquids up to 3 hours before appointment.  Clear liquids include water, black coffee, juice or soda.  No milk or cream please. 3. You may take your regular medication, including pain medications, with a sip of water before your appointment  Diabetics should hold regular insulin (if taken separately) and take 1/2 normal NPH dos the morning of the procedure.  Carry some sugar containing items with you to your appointment. 4. A driver must accompany you and be prepared to drive you home after your procedure.  5. Bring all your current medications  with your. 6. An IV may be inserted and sedation may be given at the discretion of the physician.   7. A blood pressure cuff, EKG and other monitors will often be applied during the procedure.  Some patients may need to have extra oxygen administered for a short period. 8. You will be asked to provide medical information, including your allergies, prior to the procedure.  We must know immediately if you are taking blood thinners (like Coumadin/Warfarin)  Or if you are allergic to IV iodine contrast (dye). We must know if you could possible be pregnant.  Possible side-effects:  Bleeding from needle site  Infection (rare, may require surgery)  Nerve injury (rare)  Numbness & tingling (temporary)  Difficulty urinating (rare, temporary)  Spinal headache ( a headache worse with upright posture)  Light -headedness (temporary)  Pain at injection site (several days)  Decreased blood pressure (temporary)  Weakness in arm/leg (temporary)  Pressure sensation in back/neck (temporary)  Call if you experience:  Fever/chills associated with headache or increased back/neck pain.  Headache worsened by an upright position.  New onset weakness or numbness of an extremity below the injection site  Hives or difficulty breathing (go to the emergency room)  Inflammation or drainage at the infection site  Severe back/neck pain  Any new symptoms which are concerning to you  Please note:  Although the local anesthetic injected can often make your back or neck feel good for several hours after the injection, the pain will likely return.  It takes 3-7 days for steroids to work in the epidural space.  You may not notice any pain relief for at least that one week.  If effective, we will often do a series of three injections spaced 3-6 weeks apart to maximally decrease your pain.  After the initial series, we generally will wait several months before considering a repeat injection of the same  type.  If you have any questions, please call [phone] [facility]

## 2018-09-24 NOTE — Telephone Encounter (Signed)
No answer at Pain management, but I called patient and informed her

## 2018-09-28 ENCOUNTER — Telehealth: Payer: Self-pay | Admitting: Pain Medicine

## 2018-09-28 NOTE — Telephone Encounter (Signed)
Talked with Dr Lowella Dandy and he stated she should stop it due to her bleeding status and he does not want it to go in her spinal space and cause paralyzes

## 2018-09-28 NOTE — Telephone Encounter (Signed)
Talked and spoke with Nurse at the Cancer center and she states that medication [name] is a chemo drug not a blood thinner and if the doctor felt the need to stop it for 7 days she can. You will find a note written in epic.

## 2018-09-28 NOTE — Telephone Encounter (Signed)
Notified patient per voicemail that we have sent a fax to oncologist's office asking if ok to stop the medication and how long before the procedure it should be discontinued. We have not received a response. I suggested she contact the oncologist and ask them to send a fax to our office with this information.

## 2018-09-28 NOTE — Telephone Encounter (Signed)
Called the Patient and informed her of the risks for not stopping imbruvica. Pt with understanding and reschduled for next week and understands to stop.

## 2018-09-28 NOTE — Telephone Encounter (Signed)
Pt called and left a voicemail stating that she spoke with her oncologist about the med Dr. Consuela Mimes was asking about and they told her that he can have the procedure done while taking the med or she can stop taking it to have the procedure done, it did not matter. She stated that the oncologist did say that the medication is NOT a blood thinner. Pt has procedure appt tomorrow and would like to know what Dr. Delane Ginger would like for her to do about this med?

## 2018-09-28 NOTE — Telephone Encounter (Signed)
Patient called stating she spoke with her physician about stopping medication for procedure. They told her it was ok to stop for 7 days but also there was not reason she could not have injections while on medication as well. The physician office is supposed to send over a note about this

## 2018-09-28 NOTE — Telephone Encounter (Signed)
Called and talked with patient and she states that she has an appt tomorrow and has not stop the medication. She doesn't understanding why she has to stop if the cancer center stated she needed too.Will talk with Dr Lowella Dandy

## 2018-09-29 ENCOUNTER — Ambulatory Visit: Payer: BLUE CROSS/BLUE SHIELD | Admitting: Pain Medicine

## 2018-09-29 ENCOUNTER — Telehealth: Payer: Self-pay | Admitting: *Deleted

## 2018-09-29 NOTE — Telephone Encounter (Signed)
Patient informed ok to stop Imburvica for 7 days before procedure. Pre procedure instructions given.

## 2018-10-01 ENCOUNTER — Ambulatory Visit: Payer: BLUE CROSS/BLUE SHIELD | Admitting: Pain Medicine

## 2018-10-03 ENCOUNTER — Emergency Department
Admission: EM | Admit: 2018-10-03 | Discharge: 2018-10-03 | Disposition: A | Discharge status: Home / Self Care | Payer: BLUE CROSS/BLUE SHIELD | Attending: Emergency Medicine | Admitting: Emergency Medicine

## 2018-10-03 ENCOUNTER — Other Ambulatory Visit: Payer: Self-pay

## 2018-10-03 ENCOUNTER — Encounter: Payer: Self-pay | Admitting: Emergency Medicine

## 2018-10-03 DIAGNOSIS — F419 Anxiety disorder, unspecified: Secondary | ICD-10-CM | POA: Insufficient documentation

## 2018-10-03 DIAGNOSIS — F1290 Cannabis use, unspecified, uncomplicated: Secondary | ICD-10-CM | POA: Diagnosis not present

## 2018-10-03 DIAGNOSIS — G500 Trigeminal neuralgia: Secondary | ICD-10-CM | POA: Diagnosis not present

## 2018-10-03 DIAGNOSIS — Z79899 Other long term (current) drug therapy: Secondary | ICD-10-CM | POA: Insufficient documentation

## 2018-10-03 DIAGNOSIS — F329 Major depressive disorder, single episode, unspecified: Secondary | ICD-10-CM | POA: Insufficient documentation

## 2018-10-03 DIAGNOSIS — Z8541 Personal history of malignant neoplasm of cervix uteri: Secondary | ICD-10-CM | POA: Diagnosis not present

## 2018-10-03 DIAGNOSIS — F17200 Nicotine dependence, unspecified, uncomplicated: Secondary | ICD-10-CM | POA: Diagnosis not present

## 2018-10-03 DIAGNOSIS — R51 Headache: Secondary | ICD-10-CM | POA: Diagnosis not present

## 2018-10-03 LAB — COMPREHENSIVE METABOLIC PANEL
ALK PHOS: 58 U/L (ref 38–126)
ALT: 12 U/L (ref 0–44)
AST: 19 U/L (ref 15–41)
Albumin: 3.9 g/dL (ref 3.5–5.0)
Anion gap: 6 (ref 5–15)
BUN: 15 mg/dL (ref 6–20)
CO2: 25 mmol/L (ref 22–32)
Calcium: 8.3 mg/dL — ABNORMAL LOW (ref 8.9–10.3)
Chloride: 107 mmol/L (ref 98–111)
Creatinine, Ser: 0.71 mg/dL (ref 0.44–1.00)
GFR calc Af Amer: >60 mL/min (ref >60)
GFR calc non Af Amer: >60 mL/min (ref >60)
Glucose, Bld: 83 mg/dL (ref 70–99)
Potassium: 3.9 mmol/L (ref 3.5–5.1)
SODIUM: 138 mmol/L (ref 135–145)
Total Bilirubin: 0.8 mg/dL (ref 0.3–1.2)
Total Protein: 6.7 g/dL (ref 6.5–8.1)

## 2018-10-03 LAB — CBC WITH DIFFERENTIAL/PLATELET
ABS IMMATURE GRANULOCYTES: 0.41 10*3/uL — AB (ref 0.00–0.07)
Basophils Absolute: 0.1 10*3/uL (ref 0.0–0.1)
Basophils Relative: 1 %
Eosinophils Absolute: 0.1 10*3/uL (ref 0.0–0.5)
Eosinophils Relative: 1 %
HCT: 43 % (ref 36.0–46.0)
Hemoglobin: 13.3 g/dL (ref 12.0–15.0)
Immature Granulocytes: 2 %
Lymphocytes Relative: 57 %
Lymphs Abs: 10.3 10*3/uL — ABNORMAL HIGH (ref 0.7–4.0)
MCH: 31.2 pg (ref 26.0–34.0)
MCHC: 30.9 g/dL (ref 30.0–36.0)
MCV: 100.9 fL — ABNORMAL HIGH (ref 80.0–100.0)
Monocytes Absolute: 0.7 10*3/uL (ref 0.1–1.0)
Monocytes Relative: 4 %
NEUTROS ABS: 6.3 10*3/uL (ref 1.7–7.7)
Neutrophils Relative %: 35 %
PLATELETS: 148 10*3/uL — AB (ref 150–400)
RBC: 4.26 MIL/uL (ref 3.87–5.11)
RDW: 12.6 % (ref 11.5–15.5)
WBC: 18.1 10*3/uL — ABNORMAL HIGH (ref 4.0–10.5)
nRBC: 0 /100 WBC

## 2018-10-03 LAB — TROPONIN I: Troponin I: <0.03 ng/mL (ref <0.03)

## 2018-10-03 MED ORDER — LAMOTRIGINE 25 MG PO TABS: ORAL_TABLET | ORAL | 0 refills | Status: DC

## 2018-10-03 MED ORDER — LAMOTRIGINE 25 MG PO TABS
25.0000 mg | ORAL_TABLET | Freq: Once | ORAL | Status: AC
  Administered 2018-10-03: 25 mg via ORAL
  Filled 2018-10-03: qty 1

## 2018-10-03 NOTE — ED Provider Notes (Signed)
[facility] Emergency Department Provider Note  ___________________________________________   First MD Initiated Contact with Patient 10/03/18 [phone]     (approximate)  I have reviewed the triage vital signs and the nursing notes.   HISTORY  Chief Complaint Numbness   HPI Monique [name] is a 48 y.o. female with a history of CLL, degenerative disc disease on muscle relaxers as well as spinal injections.  Recently suspended Imbruvica secondary to injections in her back which are scheduled for tomorrow who is presenting emergency department today complaining of facial pain and numbness.  She states that over the past day she has had numbness and tingling periorally as well as pain extending along the bilateral mandibles and anteriorly.  States that she has had this previously and on review of records it was treated with carbamazepine as a diagnosed trigeminal neuralgia.  Was worked up for this this past April and had MRIs which were negative for acute findings in the brain.  Patient also complaining of tightness to the occiput as well as the neck.  Denies any weakness or numbness to the bilateral upper or lower extremities.  No facial drooping.  No speech alteration.  Says that she went to the dentist several months ago and was told that she had periodontal disease but no cavities.  Says that she brushes twice a day also flosses.   Past Medical History:  Diagnosis Date  . Anxiety   . Bursitis    leg pain  . Cervical cancer ([place])    hx LEEP over 18 years ago.   . Cervical intraepithelial neoplasia I   . Chronic lymphocytic leukemia ([place]) 2018   Dr Grayland Ormond  . Depression   . Eating disorder   . History of self-harm   . Insomnia   . Obsession   . Pap smear abnormality of cervix with LGSIL   . Tobacco abuse   . Vitamin B12 deficiency (non anemic)     Patient Active Problem List   Diagnosis Date Noted  . Lumbar radiculitis (Right) 09/24/2018  .  Hypocalcemia 06/03/2018  . Vitamin D insufficiency 06/03/2018  . Abnormal MRI, lumbar spine (04/20/2017) 06/03/2018  . Chronic hip pain (Right) 06/03/2018  . Spondylosis without myelopathy or radiculopathy, lumbar region 06/03/2018  . DDD (degenerative disc disease), lumbar 06/02/2018  . Lumbar facet hypertrophy 06/02/2018  . Lumbar facet arthropathy 06/02/2018  . Lumbar facet syndrome (Bilateral) (R>L) 06/02/2018  . Marijuana use 05/19/2018  . Chronic lower extremity pain (Primary Area of Pain) (Bilateral) (R>L) 05/13/2018  . Chronic low back pain (Secondary Area of Pain) (Bilateral) (R>L) w/ sciatica (Bilateral) 05/13/2018  . Chronic pain syndrome 05/13/2018  . Opiate use 05/13/2018  . Disorder of skeletal system 05/13/2018  . Pharmacologic therapy 05/13/2018  . Problems influencing health status 05/13/2018  . Mastalgia 05/05/2018  . Breast mass, right 01/07/2018  . Facial pain 12/11/2017  . Tingling 12/11/2017  . Thoracic aortic atherosclerosis ([place]) 08/20/2017  . Emphysema of lung ([place]) 08/20/2017  . Adductor tendinitis 04/23/2017  . Trochanteric bursitis of right hip 04/23/2017  . GERD without esophagitis 12/11/2016  . CLL (chronic lymphocytic leukemia) ([place]) 11/24/2016  . Pap smear abnormality of cervix/human papillomavirus (HPV) positive 09/12/2016  . Stress incontinence 09/04/2016  . B12 deficiency 07/10/2015  . Insomnia, persistent 07/10/2015  . Mild episode of recurrent major depressive disorder ([place]) 07/10/2015  . Anorexia nervosa, restricting type 07/10/2015  . Anxiety, generalized 07/10/2015  . H/O suicide attempt 07/10/2015  . Lymphocytosis 07/10/2015  .  Obsessive-compulsive disorder 07/10/2015  . Tobacco use 07/10/2015  . History of cervical dysplasia 07/18/2014    Past Surgical History:  Procedure Laterality Date  . BREAST BIOPSY Right 01/05/2018   US guided biopsy of 2 areas and 1 lymph node, MIXED INFLAMMATION AND GIANT CELL REACTION  . CERVICAL BIOPSY  W/  LOOP ELECTRODE EXCISION    . OTHER SURGICAL HISTORY     scar tissue removed from vocal cords  . TUBAL LIGATION      Prior to Admission medications   Medication Sig Start Date End Date Taking? Authorizing Provider  albuterol (PROAIR HFA) 108 (90 Base) MCG/ACT inhaler  02/18/17   [provider]  Calcium Carbonate-Vit D-Min (GNP CALCIUM 1200) 1200-1000 MG-UNIT CHEW Chew 1,200 mg by mouth daily with breakfast. Take in combination with vitamin D and magnesium. 06/03/18 11/30/18  [name], MD  Cholecalciferol (VITAMIN D3) 5000 units CAPS Take 1 capsule (5,000 Units total) by mouth daily with breakfast. Take along with calcium and magnesium. 06/03/18 11/30/18  [name], MD  Fluticasone-Umeclidin-Vilant (TRELEGY ELLIPTA) 100-62.5-25 MCG/INH AEPB Inhale 1 puff into the lungs daily. 09/11/18   Hubbard Hartshorn, FNP  gabapentin (NEURONTIN) 100 MG capsule Take 100 mg by mouth 2 (two) times daily.  12/09/17   [provider]  Ibrutinib 420 MG TABS Take 420 mg by mouth daily. 07/16/18   Lloyd Huger, MD  Magnesium 500 MG CAPS Take 1 capsule (500 mg total) by mouth 2 (two) times daily at 8 am and 10 pm. 06/03/18 11/30/18  [name], MD  meloxicam (MOBIC) 15 MG tablet Take 1 tablet (15 mg total) by mouth daily. 07/01/18 12/28/18  [name], MD  tiZANidine (ZANAFLEX) 2 MG tablet Take 1 tablet (2 mg total) by mouth every 8 (eight) hours as needed for muscle spasms. 07/01/18 12/28/18  [name], MD    Allergies Patient has no known allergies.  Family History  Problem Relation Age of Onset  . Cancer Mother        thyroid  . Alcohol abuse Father   . Depression Sister   . Cancer Sister        cevical  . Alcohol abuse Brother   . Depression Brother   . Bipolar disorder Brother   . ADD / ADHD Son   . Breast cancer Neg Hx     Social History Social History   Tobacco Use  . Smoking status: Current Every Day Smoker    Packs/day: 1.00     Years: 31.00    Pack years: 31.00    Start date: 09/25/1986  . Smokeless tobacco: Never Used  Substance Use Topics  . Alcohol use: Yes    Alcohol/week: 0.0 standard drinks    Comment: rarely  . Drug use: Yes    Types: Marijuana    Review of Systems  Constitutional: No fever/chills Eyes: No visual changes. ENT: No sore throat. Cardiovascular: Denies chest pain. Respiratory: Denies shortness of breath. Gastrointestinal: No abdominal pain.  No nausea, no vomiting.  No diarrhea.  No constipation. Genitourinary: Negative for dysuria. Musculoskeletal: Negative for back pain. Skin: Negative for rash. Neurological: Negative for headaches, focal weakness    ____________________________________________   PHYSICAL EXAM:  VITAL SIGNS: ED Triage Vitals  Enc Vitals Group     BP 10/03/18 0937 95/80     Pulse Rate 10/03/18 0935 80     Resp 10/03/18 0935 16     Temp 10/03/18 0935 98.6 F (37 C)  Temp Source 10/03/18 0935 Oral     SpO2 10/03/18 0935 100 %     Weight --      Height --      Head Circumference --      Peak Flow --      Pain Score 10/03/18 0937 10     Pain Loc --      Pain Edu? --      Excl. in [place]? --     Constitutional: Alert and oriented. Well appearing and in no acute distress. Eyes: Conjunctivae are normal.  Head: Atraumatic. Nose: No congestion/rhinnorhea. Mouth/Throat: Mucous membranes are moist.  No tongue or pharyngeal swelling.  No erythema.  No dental erosion.  No tenderness palpation along the teeth.  No swelling or fluctuance.   Neck: No stridor.   Cardiovascular: Normal rate, regular rhythm. Grossly normal heart sounds.   Respiratory: Normal respiratory effort.  No retractions. Lungs CTAB. Gastrointestinal: Soft and nontender. No distention. No CVA tenderness. Musculoskeletal: No lower extremity tenderness nor edema.  No joint effusions. Neurologic:  Normal speech and language.  No trismus.  Decrease in station to light touch along the  distribution of the third branch of the trigeminal nerve, bilaterally.  No facial drooping.  Otherwise below the neck there is also no focal finding.  Normal neurologic exam otherwise from noted just above.  No swelling or fluctuance noted. Skin:  Skin is warm, dry and intact. No rash noted. Psychiatric: Mood and affect are normal. Speech and behavior are normal.  ____________________________________________   LABS (all labs ordered are listed, but only abnormal results are displayed)  Labs Reviewed  CBC WITH DIFFERENTIAL/PLATELET - Abnormal; Notable for the following components:      Result Value   WBC 18.1 (*)    MCV 100.9 (*)    Platelets 148 (*)    Lymphs Abs 10.3 (*)    Abs Immature Granulocytes 0.41 (*)    All other components within normal limits  COMPREHENSIVE METABOLIC PANEL - Abnormal; Notable for the following components:   Calcium 8.3 (*)    All other components within normal limits  TROPONIN I   ____________________________________________  EKG  ED ECG REPORT I, Doran Stabler, the attending physician, personally viewed and interpreted this ECG.   Date: 10/03/2018  EKG Time: 0933  Rate: 76  Rhythm: normal sinus rhythm  Axis: Rightward  Intervals:none  ST&T Change: No ST segment elevation or depression.  No abnormal T wave inversion.  ____________________________________________  RADIOLOGY   ____________________________________________   PROCEDURES  Procedure(s) performed:   Procedures  Critical Care performed:   ____________________________________________   INITIAL IMPRESSION / ASSESSMENT AND PLAN / ED COURSE  Pertinent labs & imaging results that were available during my care of the patient were reviewed by me and considered in my medical decision making (see chart for details).  DDX: Trigeminal neuralgia, ACS, TMJ, dental caries, dental pain, dental abscess As part of my medical decision making, I reviewed the following data within the  electronic MEDICAL RECORD NUMBER Notes from prior outpatient visits.  Reviewed the patient's neurology visits from this past April as well as her primary care visits with him around the time as well regarding her trigeminal neuralgia.  Was placed on carbamazepine at that time.  She said that she did have pain relief with the carbamazepine.  However, carbamazepine is not to be combined with Imbruvica because of an enzyme competition.  Will start the patient on lamotrigine at this time.  Pending lab  work.  ----------------------------------------- 11:28 AM on 10/03/2018 -----------------------------------------  Patient at this time says that her chin/face feels "swollen."  I do not observe any objective swelling but she says that she has the sensation of this.  I will discharge the patient with lamotrigine with a working diagnosis of trigeminal neuralgia which she has been diagnosed with in the past.  The patient is understanding of this diagnosis and plan and willing to comply.  Very reassuring cardiac work-up.  Do not believe it is neurologic.  Patient with similar symptoms in the past with negative MRI.  No lateralizing findings. ____________________________________________   FINAL CLINICAL IMPRESSION(S) / ED DIAGNOSES  Trigeminal neuralgia.  NEW MEDICATIONS STARTED DURING THIS VISIT:  New Prescriptions   No medications on file     Note:  This document was prepared using Dragon voice recognition software and may include unintentional dictation errors.     Orbie Pyo, MD 10/03/18 1131

## 2018-10-03 NOTE — ED Triage Notes (Signed)
Pt to ED from [facility] in for evaluation of numbness on the both sides of her jaw. Pt states that symptoms started yesterday around 0900, last known well yesterday around 0600. Pt states that she started having numbness yesterday morning but it progressively got worse throughout the day. Pt states that she was having pain in her mouth so severe that when she tried to eat it felt like her teeth were going to crumble. Pt states that she has been unable to eat since then due to the pain. Pt does not appear to be in distress at this time.

## 2018-10-03 NOTE — ED Notes (Signed)
Pt is being discharged to home. Pt is AOx4, VSS, she c/o numbness at this time but reports that the pain has decreased and she does not show any signs of distress. AVS and RX was given and explained to the pt and she verbalized understanding of all information.

## 2018-10-03 NOTE — ED Notes (Signed)
ED Provider at bedside. 

## 2018-10-05 ENCOUNTER — Ambulatory Visit (INDEPENDENT_AMBULATORY_CARE_PROVIDER_SITE_OTHER): Payer: BLUE CROSS/BLUE SHIELD | Admitting: Family Medicine

## 2018-10-05 ENCOUNTER — Encounter: Payer: Self-pay | Admitting: Family Medicine

## 2018-10-05 ENCOUNTER — Encounter: Payer: Self-pay | Admitting: Emergency Medicine

## 2018-10-05 VITALS — BP 112/60 | HR 81 | Temp 97.6°F | Resp 16 | Ht 63.0 in | Wt 159.8 lb

## 2018-10-05 DIAGNOSIS — R51 Headache: Secondary | ICD-10-CM

## 2018-10-05 DIAGNOSIS — G500 Trigeminal neuralgia: Secondary | ICD-10-CM

## 2018-10-05 DIAGNOSIS — J439 Emphysema, unspecified: Secondary | ICD-10-CM | POA: Diagnosis not present

## 2018-10-05 DIAGNOSIS — M79604 Pain in right leg: Secondary | ICD-10-CM

## 2018-10-05 DIAGNOSIS — R519 Headache, unspecified: Secondary | ICD-10-CM

## 2018-10-05 DIAGNOSIS — R05 Cough: Secondary | ICD-10-CM

## 2018-10-05 DIAGNOSIS — F330 Major depressive disorder, recurrent, mild: Secondary | ICD-10-CM

## 2018-10-05 DIAGNOSIS — C9110 Chronic lymphocytic leukemia of B-cell type not having achieved remission: Secondary | ICD-10-CM | POA: Diagnosis not present

## 2018-10-05 DIAGNOSIS — R053 Chronic cough: Secondary | ICD-10-CM

## 2018-10-05 MED ORDER — BENZONATATE 100 MG PO CAPS: 100.0000 mg | ORAL_CAPSULE | Freq: Three times a day (TID) | ORAL | 0 refills | Status: DC | PRN

## 2018-10-05 MED ORDER — PREDNISONE 5 MG (48) PO TBPK: ORAL_TABLET | ORAL | 0 refills | Status: DC

## 2018-10-05 NOTE — Progress Notes (Signed)
Name: Monique [name]   MRN: [id number]    DOB: 08-29-1971   Date:10/05/2018       Progress Note  Subjective  Chief Complaint  Chief Complaint  Patient presents with  . Jaw Pain    Onset-Since Friday, Right side and numbness from her bottom lip to her chin. Unable to eat due to the pain and numbness  . Leg Pain  . Headache    Onset-In the back of her head  . Cough    Dry Cough, nasal congestion, chucks in her phlegm and incontience    HPI  RIGHT leg Pain: Started in her shin, has slowly worked its way into her thigh and into the groin area.  Has history of similar pain and was treated for bursitis in the past. She denies low back pain.  She does have CLL - no history of bone mets, has follow up scan next month.   Trigeminal Neuralgia/Headache/Mouth numbness and pain:  Was seen at [facility] walk in clinic and was sent to ER - was diagnosed with trigeminal neuralgia.  Had reassuring cardiac work up.  She was started on lamictal instead of carbamazepine due to being on Ibrutinib.  She started yesterday on lamotrigine and is not having any relief - she is taking tylenol daily.  Also on zanaflex and meloxicam for chronic pain, and these are not helping the headache. Did advise that lamictal will take time to build up in her system to provide relief of her pain, she verbalizes understanding of tapering up after 2 weeks.  Chronic cough/Emphysema: Taking trelegy.  Down to 5 cigarettes daily. She has albuterol but forgets to take it.  We will rx tessalon today to help her pain as well.   Patient Active Problem List   Diagnosis Date Noted  . Lumbar radiculitis (Right) 09/24/2018  . Hypocalcemia 06/03/2018  . Vitamin D insufficiency 06/03/2018  . Abnormal MRI, lumbar spine (04/20/2017) 06/03/2018  . Chronic hip pain (Right) 06/03/2018  . Spondylosis without myelopathy or radiculopathy, lumbar region 06/03/2018  . DDD (degenerative disc disease), lumbar 06/02/2018  . Lumbar facet hypertrophy  06/02/2018  . Lumbar facet arthropathy 06/02/2018  . Lumbar facet syndrome (Bilateral) (R>L) 06/02/2018  . Marijuana use 05/19/2018  . Chronic lower extremity pain (Primary Area of Pain) (Bilateral) (R>L) 05/13/2018  . Chronic low back pain (Secondary Area of Pain) (Bilateral) (R>L) w/ sciatica (Bilateral) 05/13/2018  . Chronic pain syndrome 05/13/2018  . Opiate use 05/13/2018  . Disorder of skeletal system 05/13/2018  . Pharmacologic therapy 05/13/2018  . Problems influencing health status 05/13/2018  . Mastalgia 05/05/2018  . Breast mass, right 01/07/2018  . Facial pain 12/11/2017  . Tingling 12/11/2017  . Thoracic aortic atherosclerosis ([place]) 08/20/2017  . Emphysema of lung ([place]) 08/20/2017  . Adductor tendinitis 04/23/2017  . Trochanteric bursitis of right hip 04/23/2017  . GERD without esophagitis 12/11/2016  . CLL (chronic lymphocytic leukemia) ([place]) 11/24/2016  . Pap smear abnormality of cervix/human papillomavirus (HPV) positive 09/12/2016  . Stress incontinence 09/04/2016  . B12 deficiency 07/10/2015  . Insomnia, persistent 07/10/2015  . Mild episode of recurrent major depressive disorder ([place]) 07/10/2015  . Anorexia nervosa, restricting type 07/10/2015  . Anxiety, generalized 07/10/2015  . H/O suicide attempt 07/10/2015  . Lymphocytosis 07/10/2015  . Obsessive-compulsive disorder 07/10/2015  . Tobacco use 07/10/2015  . History of cervical dysplasia 07/18/2014    Social History   Tobacco Use  . Smoking status: Current Every Day Smoker    Packs/day:  1.00    Years: 31.00    Pack years: 31.00    Start date: 09/25/1986  . Smokeless tobacco: Never Used  Substance Use Topics  . Alcohol use: Yes    Alcohol/week: 0.0 standard drinks    Comment: rarely     Current Outpatient Medications:  .  Calcium Carbonate-Vit D-Min (GNP CALCIUM 1200) 1200-1000 MG-UNIT CHEW, Chew 1,200 mg by mouth daily with breakfast. Take in combination with vitamin D and magnesium., Disp: 30  tablet, Rfl: 5 .  CHANTIX CONTINUING MONTH PAK 1 MG tablet, , Disp: , Rfl:  .  Cholecalciferol (VITAMIN D3) 5000 units CAPS, Take 1 capsule (5,000 Units total) by mouth daily with breakfast. Take along with calcium and magnesium., Disp: 30 capsule, Rfl: 5 .  Fluticasone-Umeclidin-Vilant (TRELEGY ELLIPTA) 100-62.5-25 MCG/INH AEPB, Inhale 1 puff into the lungs daily., Disp: 1 each, Rfl: 3 .  gabapentin (NEURONTIN) 100 MG capsule, Take 100 mg by mouth 2 (two) times daily. , Disp: , Rfl:  .  Ibrutinib 420 MG TABS, Take 420 mg by mouth daily., Disp: 28 tablet, Rfl: 2 .  lamoTRIgine (LAMICTAL) 25 MG tablet, Take 25 mg p.o. twice daily x2 weeks and then increase to 50 mg twice daily x2 weeks., Disp: 90 tablet, Rfl: 0 .  Magnesium 500 MG CAPS, Take 1 capsule (500 mg total) by mouth 2 (two) times daily at 8 am and 10 pm., Disp: 60 capsule, Rfl: 5 .  meloxicam (MOBIC) 15 MG tablet, Take 1 tablet (15 mg total) by mouth daily., Disp: 180 tablet, Rfl: 0 .  tiZANidine (ZANAFLEX) 2 MG tablet, Take 1 tablet (2 mg total) by mouth every 8 (eight) hours as needed for muscle spasms., Disp: 90 tablet, Rfl: 5  No Known Allergies  I personally reviewed active problem list, medication list, allergies, lab results with the patient/caregiver today.  ROS  Ten systems reviewed and is negative except as mentioned in HPI  Objective  Vitals:   10/05/18 0922  BP: 112/60  Pulse: 81  Resp: 16  Temp: 97.6 F (36.4 C)  TempSrc: Oral  SpO2: 99%  Weight: 159 lb 12.8 oz (72.5 kg)  Height: 5\' 3"  (1.6 m)    Body mass index is 28.31 kg/m.  Nursing Note and Vital Signs reviewed.  Physical Exam  Constitutional: Patient appears well-developed and well-nourished. No distress.  HENT: Head: Normocephalic and atraumatic.  Nose: Nose normal. Mouth/Throat: Oropharynx is clear and moist. No oropharyngeal exudate or tonsillar swelling.  Eyes: Conjunctivae and EOM are normal. No scleral icterus.  Pupils are equal, round, and  reactive to light.  Neck: Normal range of motion. Neck supple with lymphadenopathy present, bilateral axilla also present with significant adenopathy. No JVD present. No thyromegaly present.  Cardiovascular: Normal rate, regular rhythm and normal heart sounds.  No murmur heard. No BLE edema. Pulmonary/Chest: Effort normal and breath sounds normal. No respiratory distress. Musculoskeletal: Normal range of motion, no joint effusions. No gross deformities.  There is some weakness in abduction and adduction against resistance to the right hip Neurological: Pt is alert and oriented to person, place, and time. No cranial nerve deficit. Coordination, balance, speech and gait are normal.  Skin: Skin is warm and dry. No rash noted. No erythema.  Psychiatric: Patient has a normal mood and affect. behavior is normal. Judgment and thought content normal.  Results for orders placed or performed during the hospital encounter of 10/03/18 (from the past 72 hour(s))  CBC with Differential     Status: Abnormal  Collection Time: 10/03/18 10:12 AM  Result Value Ref Range   WBC 18.1 (H) 4.0 - 10.5 K/uL   RBC 4.26 3.87 - 5.11 MIL/uL   Hemoglobin 13.3 12.0 - 15.0 g/dL   HCT 43.0 36.0 - 46.0 %   MCV 100.9 (H) 80.0 - 100.0 fL   MCH 31.2 26.0 - 34.0 pg   MCHC 30.9 30.0 - 36.0 g/dL   RDW 12.6 11.5 - 15.5 %   Platelets 148 (L) 150 - 400 K/uL   Neutrophils Relative % 35 %   Neutro Abs 6.3 1.7 - 7.7 K/uL   Lymphocytes Relative 57 %   Lymphs Abs 10.3 (H) 0.7 - 4.0 K/uL   Monocytes Relative 4 %   Monocytes Absolute 0.7 0.1 - 1.0 K/uL   Eosinophils Relative 1 %   Eosinophils Absolute 0.1 0.0 - 0.5 K/uL   Basophils Relative 1 %   Basophils Absolute 0.1 0.0 - 0.1 K/uL   WBC Morphology ABSOLUTE LYMPHOCYTOSIS    Smear Review SMUDGE CELLS    nRBC 0 0 /100 WBC   Immature Granulocytes 2 %   Abs Immature Granulocytes 0.41 (H) 0.00 - 0.07 K/uL    Comment: Performed at [facility], [address]  [place], [place] [address]  Comprehensive metabolic panel     Status: Abnormal   Collection Time: 10/03/18 10:12 AM  Result Value Ref Range   Sodium 138 135 - 145 mmol/L   Potassium 3.9 3.5 - 5.1 mmol/L   Chloride 107 98 - 111 mmol/L   CO2 25 22 - 32 mmol/L   Glucose, Bld 83 70 - 99 mg/dL   BUN 15 6 - 20 mg/dL   Creatinine, Ser 0.71 0.44 - 1.00 mg/dL   Calcium 8.3 (L) 8.9 - 10.3 mg/dL   Total Protein 6.7 6.5 - 8.1 g/dL   Albumin 3.9 3.5 - 5.0 g/dL   AST 19 15 - 41 U/L   ALT 12 0 - 44 U/L   Alkaline Phosphatase 58 38 - 126 U/L   Total Bilirubin 0.8 0.3 - 1.2 mg/dL   GFR calc non Af Amer >60 >60 mL/min   GFR calc Af Amer >60 >60 mL/min   Anion gap 6 5 - 15    Comment: Performed at [facility], [address] [place], [place] [address]  Troponin I - Add-On to previous collection     Status: None   Collection Time: 10/03/18 10:12 AM  Result Value Ref Range   Troponin I <0.03 <0.03 ng/mL    Comment: Performed at [facility], [address] [place], [place] [address]    Assessment & Plan  1. Right leg pain - Suspect bursitis as symptoms are similar to previous episodes.  - predniSONE (STERAPRED UNI-PAK 48 TAB) 5 MG (48) TBPK tablet; Take as directed  Dispense: 48 tablet; Refill: 0 - Ambulatory referral to [facility]  2. CLL (chronic lymphocytic leukemia) ([place]) - Ambulatory referral to Chronic Care Management Services  3. Pulmonary emphysema, unspecified emphysema type (HCC) - benzonatate (TESSALON PERLES) 100 MG capsule; Take 1 capsule (100 mg total) by mouth 3 (three) times daily as needed.  Dispense: 30 capsule; Refill: 0 - predniSONE (STERAPRED UNI-PAK 48 TAB) 5 MG (48) TBPK tablet; Take as directed  Dispense: 48 tablet; Refill: 0 - Ambulatory referral to Chronic Care Management Services  4. Mild episode of recurrent major depressive disorder ([place]) - Ambulatory referral to Chronic Care Management Services  5. Chronic coughing -  benzonatate (TESSALON PERLES) 100 MG capsule; Take 1 capsule (100 mg total) by mouth 3 (three) times daily as needed.  Dispense: 30 capsule; Refill: 0 - predniSONE (STERAPRED UNI-PAK 48 TAB) 5 MG (48) TBPK tablet; Take as directed  Dispense: 48 tablet; Refill: 0 - Ambulatory referral to Chronic Care Management Services  6. Trigeminal neuralgia - Continue Lamictal, seeing dentist this morning after her visit with our office.  7. Nonintractable episodic headache, unspecified headache type - predniSONE (STERAPRED UNI-PAK 48 TAB) 5 MG (48) TBPK tablet; Take as directed  Dispense: 48 tablet; Refill: 0 - Tylenol PRN.  -Red flags and when to present for emergency care or RTC including fever >101.45F, chest pain, shortness of breath, new/worsening/un-resolving symptoms, reviewed with patient at time of visit. Follow up and care instructions discussed and provided in AVS.

## 2018-10-06 ENCOUNTER — Ambulatory Visit: Payer: BLUE CROSS/BLUE SHIELD | Admitting: Pain Medicine

## 2018-10-06 ENCOUNTER — Other Ambulatory Visit: Payer: Self-pay | Admitting: Oncology

## 2018-10-06 DIAGNOSIS — C9110 Chronic lymphocytic leukemia of B-cell type not having achieved remission: Secondary | ICD-10-CM

## 2018-10-08 ENCOUNTER — Telehealth: Payer: Self-pay | Admitting: Pain Medicine

## 2018-10-08 ENCOUNTER — Ambulatory Visit: Payer: BLUE CROSS/BLUE SHIELD | Admitting: Pain Medicine

## 2018-10-08 NOTE — Telephone Encounter (Signed)
error 

## 2018-10-08 NOTE — Progress Notes (Deleted)
Patient's Name: Monique [name]  MRN: [id number]  Referring Provider: [name], MD  DOB: [birth year]  PCP: [name], MD  DOS: 10/08/2018  Note by: Gaspar Cola, MD  Service setting: Ambulatory outpatient  Specialty: Interventional Pain Management  Patient type: Established  Location: [facility] (AMB) Pain Management Facility  Visit type: Interventional Procedure   Primary Reason for Visit: Interventional Pain Management Treatment. CC: No chief complaint on file.  Procedure #1:  Anesthesia, Analgesia, Anxiolysis:  Type: Therapeutic Trans-Foraminal Epidural Steroid Injection   #2  Region: Lumbar Level: L4 Paravertebral Laterality: Right Paravertebral   Type: Moderate (Conscious) Sedation combined with Local Anesthesia Indication(s): Analgesia and Anxiety Route: Intravenous (IV) IV Access: Secured Sedation: Meaningful verbal contact was maintained at all times during the procedure  Local Anesthetic: Lidocaine 1-2%  Position: Prone  Procedure #2:    Type: Therapeutic Inter-Laminar Epidural Steroid Injection   #2  Region: Lumbar Level: L3-4 Level. Laterality: Right Paramedial     Indications: 1. DDD (degenerative disc disease), lumbar   2. Lumbar radiculitis (Right)   3. Chronic lower extremity pain (Primary Area of Pain) (Bilateral) (R>L)   4. Chronic low back pain (Secondary Area of Pain) (Bilateral) (R>L) w/ sciatica (Bilateral)    Pain Score: Pre-procedure:  /10 Post-procedure:  /10  Pre-op Assessment:  Monique [name] is a 48 y.o. (year old), female patient, seen today for interventional treatment. She  has a past surgical history that includes Cervical biopsy w/ loop electrode excision; Tubal ligation; Breast biopsy (Right, 01/05/2018); and OTHER SURGICAL HISTORY. Monique [name] has a current medication list which includes the following prescription(s): benzonatate, gnp calcium 1200, chantix continuing month pak, vitamin d3, fluticasone-umeclidin-vilant, gabapentin,  imbruvica, lamotrigine, magnesium, meloxicam, prednisone, and tizanidine. Her primarily concern today is the No chief complaint on file.  Initial Vital Signs:  Pulse/HCG Rate:    Temp:   Resp:   BP:   SpO2:    BMI: Estimated body mass index is 28.31 kg/m as calculated from the following:   Height as of 10/05/18: 5\' 3"  (1.6 m).   Weight as of 10/05/18: 159 lb 12.8 oz (72.5 kg).  Risk Assessment: Allergies: Reviewed. She has No Known Allergies.  Allergy Precautions: None required Coagulopathies: Reviewed. None identified.  Blood-thinner therapy: None at this time Active Infection(s): Reviewed. None identified. Monique [name] is afebrile  Site Confirmation: Monique [name] was asked to confirm the procedure and laterality before marking the site Procedure checklist: Completed Consent: Before the procedure and under the influence of no sedative(s), amnesic(s), or anxiolytics, the patient was informed of the treatment options, risks and possible complications. To fulfill our ethical and legal obligations, as recommended by the American Medical Association's Code of Ethics, I have informed the patient of my clinical impression; the nature and purpose of the treatment or procedure; the risks, benefits, and possible complications of the intervention; the alternatives, including doing nothing; the risk(s) and benefit(s) of the alternative treatment(s) or procedure(s); and the risk(s) and benefit(s) of doing nothing. The patient was provided information about the general risks and possible complications associated with the procedure. These may include, but are not limited to: failure to achieve desired goals, infection, bleeding, organ or nerve damage, allergic reactions, paralysis, and death. In addition, the patient was informed of those risks and complications associated to Spine-related procedures, such as failure to decrease pain; infection (i.e.: Meningitis, epidural or intraspinal abscess); bleeding (i.e.:  epidural hematoma, subarachnoid hemorrhage, or any other type of intraspinal or peri-dural bleeding); organ or  nerve damage (i.e.: Any type of peripheral nerve, nerve root, or spinal cord injury) with subsequent damage to sensory, motor, and/or autonomic systems, resulting in permanent pain, numbness, and/or weakness of one or several areas of the body; allergic reactions; (i.e.: anaphylactic reaction); and/or death. Furthermore, the patient was informed of those risks and complications associated with the medications. These include, but are not limited to: allergic reactions (i.e.: anaphylactic or anaphylactoid reaction(s)); adrenal axis suppression; blood sugar elevation that in diabetics may result in ketoacidosis or comma; water retention that in patients with history of congestive heart failure may result in shortness of breath, pulmonary edema, and decompensation with resultant heart failure; weight gain; swelling or edema; medication-induced neural toxicity; particulate matter embolism and blood vessel occlusion with resultant organ, and/or nervous system infarction; and/or aseptic necrosis of one or more joints. Finally, the patient was informed that Medicine is not an exact science; therefore, there is also the possibility of unforeseen or unpredictable risks and/or possible complications that may result in a catastrophic outcome. The patient indicated having understood very clearly. We have given the patient no guarantees and we have made no promises. Enough time was given to the patient to ask questions, all of which were answered to the patient's satisfaction. Ms. [name] has indicated that she wanted to continue with the procedure. Attestation: I, the ordering provider, attest that I have discussed with the patient the benefits, risks, side-effects, alternatives, likelihood of achieving goals, and potential problems during recovery for the procedure that I have provided informed consent. Date  Time:  {CHL [facility]-PAIN TIME CHOICES:21018001}  Pre-Procedure Preparation:  Monitoring: As per clinic protocol. Respiration, ETCO2, SpO2, BP, heart rate and rhythm monitor placed and checked for adequate function Safety Precautions: Patient was assessed for positional comfort and pressure points before starting the procedure. Time-out: I initiated and conducted the "Time-out" before starting the procedure, as per protocol. The patient was asked to participate by confirming the accuracy of the "Time Out" information. Verification of the correct person, site, and procedure were performed and confirmed by me, the nursing staff, and the patient. "Time-out" conducted as per Joint Commission's Universal Protocol (UP.01.01.01). Time:    Description of Procedure #1:  Target Area: The inferior and lateral portion of the pedicle, just lateral to a line created by the 6:00 position of the pedicle and the superior articular process of the vertebral body below. On the lateral view, this target lies just posterior to the anterior aspect of the lamina and posterior to the midpoint created between the anterior and the posterior aspect of the neural foramina. Approach: Posterior paravertebral approach. Area Prepped: Entire Posterior Lumbosacral Region Prepping solution: ChloraPrep (2% chlorhexidine gluconate and 70% isopropyl alcohol) Safety Precautions: Aspiration looking for blood return was conducted prior to all injections. At no point did we inject any substances, as a needle was being advanced. No attempts were made at seeking any paresthesias. Safe injection practices and needle disposal techniques used. Medications properly checked for expiration dates. SDV (single dose vial) medications used.  Description of the Procedure: Protocol guidelines were followed. The patient was placed in position over the fluoroscopy table. The target area was identified and the area prepped in the usual manner. Skin & deeper tissues  infiltrated with local anesthetic. Appropriate amount of time allowed to pass for local anesthetics to take effect. The procedure needles were then advanced to the target area. Proper needle placement secured. Negative aspiration confirmed. Solution injected in intermittent fashion, asking for systemic symptoms every 0.2cc  of injectate. The needles were then removed and the area cleansed, making sure to leave some of the prepping solution back to take advantage of its long term bactericidal properties.  Start Time:   hrs.  Materials:  Needle(s) Type: Spinal Needle Gauge: 22G Length: 3.5-in Medication(s): Please see orders for medications and dosing details.  Description of Procedure #2:  Target Area: The  interlaminar space, initially targeting the lower border of the superior vertebral body lamina. Approach: Posterior paramedial approach. Area Prepped: Same as above Prepping solution: Same as above Safety Precautions: Same as above  Description of the Procedure: Protocol guidelines were followed. The patient was placed in position over the fluoroscopy table. The target area was identified and the area prepped in the usual manner. Skin & deeper tissues infiltrated with local anesthetic. Appropriate amount of time allowed to pass for local anesthetics to take effect. The procedure needle was introduced through the skin, ipsilateral to the reported pain, and advanced to the target area. Bone was contacted and the needle walked caudad, until the lamina was cleared. The ligamentum flavum was engaged and loss-of-resistance technique used as the epidural needle was advanced. The epidural space was identified using "loss-of-resistance technique" with 2-3 ml of PF-NaCl (0.9% NSS), in a 5cc LOR glass syringe. Proper needle placement secured. Negative aspiration confirmed. Solution injected in intermittent fashion, asking for systemic symptoms every 0.5cc of injectate. The needles were then removed and the  area cleansed, making sure to leave some of the prepping solution back to take advantage of its long term bactericidal properties.  There were no vitals filed for this visit.  End Time:   hrs.  Materials:  Needle(s) Type: Epidural needle Gauge: 17G Length: 3.5-in Medication(s): Please see orders for medications and dosing details.  Imaging Guidance (Spinal):          Type of Imaging Technique: Fluoroscopy Guidance (Spinal) Indication(s): Assistance in needle guidance and placement for procedures requiring needle placement in or near specific anatomical locations not easily accessible without such assistance. Exposure Time: Please see nurses notes. Contrast: Before injecting any contrast, we confirmed that the patient did not have an allergy to iodine, shellfish, or radiological contrast. Once satisfactory needle placement was completed at the desired level, radiological contrast was injected. Contrast injected under live fluoroscopy. No contrast complications. See chart for type and volume of contrast used. Fluoroscopic Guidance: I was personally present during the use of fluoroscopy. "Tunnel Vision Technique" used to obtain the best possible view of the target area. Parallax error corrected before commencing the procedure. "Direction-depth-direction" technique used to introduce the needle under continuous pulsed fluoroscopy. Once target was reached, antero-posterior, oblique, and lateral fluoroscopic projection used confirm needle placement in all planes. Images permanently stored in EMR. Interpretation: I personally interpreted the imaging intraoperatively. Adequate needle placement confirmed in multiple planes. Appropriate spread of contrast into desired area was observed. No evidence of afferent or efferent intravascular uptake. No intrathecal or subarachnoid spread observed. Permanent images saved into the patient's record.  Antibiotic Prophylaxis:   Anti-infectives (From admission,  onward)   None     Indication(s): None identified  Post-operative Assessment:  Post-procedure Vital Signs:  Pulse/HCG Rate:    Temp:   Resp:   BP:   SpO2:    EBL: None  Complications: No immediate post-treatment complications observed by team, or reported by patient.  Note: The patient tolerated the entire procedure well. A repeat set of vitals were taken after the procedure and the patient was kept under  observation following institutional policy, for this type of procedure. Post-procedural neurological assessment was performed, showing return to baseline, prior to discharge. The patient was provided with post-procedure discharge instructions, including a section on how to identify potential problems. Should any problems arise concerning this procedure, the patient was given instructions to immediately contact us, at any time, without hesitation. In any case, we plan to contact the patient by telephone for a follow-up status report regarding this interventional procedure.  Comments:  No additional relevant information.  Plan of Care   Imaging Orders  No imaging studies ordered today   Procedure Orders    No procedure(s) ordered today    Medications ordered for procedure: No orders of the defined types were placed in this encounter.  Medications administered: Britny L. Bobst had no medications administered during this visit.  See the medical record for exact dosing, route, and time of administration.  Disposition: Discharge home  Discharge Date & Time: 10/08/2018;   hrs.   Physician-requested Follow-up: No follow-ups on file.  Future Appointments  Date Time Provider [place]  10/08/2018  1:15 PM [name], MD [facility] None  10/22/2018  8:45 AM [facility] None  10/22/2018 10:00 AM [facility]  10/29/2018  3:30 PM Grayland Ormond, Kathlene November, MD [facility] None  12/23/2018  8:40 AM [name], MD [facility]  12/30/2018  9:45 AM  Vevelyn Francois, NP [facility] None   Primary Care Physician: [name], MD Location: [facility] Outpatient [facility] Management Facility Note by: Gaspar Cola, MD Date: 10/08/2018; Time: 7:49 AM  Disclaimer:  Medicine is not an Chief Strategy Officer. The only guarantee in medicine is that nothing is guaranteed. It is important to note that the decision to proceed with this intervention was based on the information collected from the patient. The Data and conclusions were drawn from the patient's questionnaire, the interview, and the physical examination. Because the information was provided in large part by the patient, it cannot be guaranteed that it has not been purposely or unconsciously manipulated. Every effort has been made to obtain as much relevant data as possible for this evaluation. It is important to note that the conclusions that lead to this procedure are derived in large part from the available data. Always take into account that the treatment will also be dependent on availability of resources and existing treatment guidelines, considered by other Pain Management Practitioners as being common knowledge and practice, at the time of the intervention. For Medico-Legal purposes, it is also important to point out that variation in procedural techniques and pharmacological choices are the acceptable norm. The indications, contraindications, technique, and results of the above procedure should only be interpreted and judged by a Board-Certified Interventional Pain Specialist with extensive familiarity and expertise in the same exact procedure and technique.

## 2018-10-09 ENCOUNTER — Ambulatory Visit: Payer: Self-pay | Admitting: Pharmacist

## 2018-10-09 DIAGNOSIS — C9110 Chronic lymphocytic leukemia of B-cell type not having achieved remission: Secondary | ICD-10-CM

## 2018-10-09 DIAGNOSIS — J439 Emphysema, unspecified: Secondary | ICD-10-CM

## 2018-10-09 NOTE — Chronic Care Management (AMB) (Signed)
  Care Management   Note  10/09/2018 Name: Ori Kreiter MRN: [id number] DOB: 11-26-1970  Kiernan Farkas Keshishyan is a 48 y.o. year old female who sees [name], MD for primary care. Raelyn Ensign, FNP asked the CCM team to consult the patient for assistance with chronic disease management related to application for disability services. Referral was placed 10/05/18. Telephone outreach to patient today to introduce care management services.   HIPAA identifiers verified.  Ms. Donaghy declines care management needs at this time. She states that she is working currently and would like to see if she can "handle" work but wanted to consider a "back up plan" such as disability in case work became intolerable for her.   Plan: Provided Ms. Stallsmith with Care Management contact information should she wish to get in touch in the future.   Ruben Reason, PharmD Clinical Pharmacist [facility] [phone]

## 2018-10-15 ENCOUNTER — Telehealth: Payer: Self-pay | Admitting: Pharmacy Technician

## 2018-10-15 MED FILL — IMBRUVICA 420 MG TAB: 420 | 28 days supply | Qty: 28 | Fill #0

## 2018-10-15 NOTE — Telephone Encounter (Signed)
Oral Oncology Patient Advocate Encounter   Was successful in obtaining a copay card for [employer].  This copay card will make the patients copay $10.  The billing information is as follows and has been shared with [place].   RxBin: [id number] Member ID: [id number] Group ID: [id number]   Monique Nancy Comern­o Patient [place] Phone [phone] Fax [phone] 10/15/2018 8:48 AM

## 2018-10-19 ENCOUNTER — Ambulatory Visit: Payer: BLUE CROSS/BLUE SHIELD

## 2018-10-19 ENCOUNTER — Ambulatory Visit: Payer: Self-pay | Admitting: *Deleted

## 2018-10-19 ENCOUNTER — Other Ambulatory Visit: Payer: BLUE CROSS/BLUE SHIELD

## 2018-10-19 NOTE — Telephone Encounter (Signed)
Per pt's initial contact 10/19/3028 at 1122, "Pt dx with Trigeminal neuralgia on 10/03/18 and her mouth and lip still numb. It has been 3 wks. She wants to know if this is normal? How long shoud she expect to be numb? If not normal, does she need to come back in to see the dr?";  Contacted regarding symptoms; she says that she got diagnosed 3 weeks ago; she says that her bottom lip to chin is still numb; she normally sees Dr Ancil Boozer but she has no availability to meet the pt's needs( other medical appointments); the same applies to Raelyn Ensign; pt offered and accepted appointment with Suezanne Cheshire, [facility], 10/20/2018 at  [place]; she verbalized understanding; will route to office for notification   Reason for Disposition . General information question, no triage required and triager able to answer question  Answer Assessment - Initial Assessment Questions 1. REASON FOR CALL or QUESTION: "What is your reason for calling today?" or "How can I best help you?" or "What question do you have that I can help answer?"     Continued facial numbness; diagnosed with trigeminal neuralgia 3 weeks ago  Protocols used: INFORMATION ONLY CALL-A-AH

## 2018-10-19 NOTE — Telephone Encounter (Signed)
Please review prior to appt

## 2018-10-20 ENCOUNTER — Encounter: Payer: Self-pay | Admitting: Nurse Practitioner

## 2018-10-20 ENCOUNTER — Ambulatory Visit (INDEPENDENT_AMBULATORY_CARE_PROVIDER_SITE_OTHER): Payer: BLUE CROSS/BLUE SHIELD | Admitting: Nurse Practitioner

## 2018-10-20 ENCOUNTER — Ambulatory Visit: Payer: BLUE CROSS/BLUE SHIELD | Admitting: Oncology

## 2018-10-20 VITALS — BP 110/62 | HR 91 | Temp 97.9°F | Resp 14 | Ht 63.0 in | Wt 159.7 lb

## 2018-10-20 DIAGNOSIS — G500 Trigeminal neuralgia: Secondary | ICD-10-CM

## 2018-10-20 NOTE — Progress Notes (Addendum)
Name: Monique [name] MRN: [id number] DOB: 1971/04/08 Date:10/20/2018 Progress Note Subjective Chief Complaint Chief Complaint  Patient presents with  . Trigeminal Neuralgia    patient stated that it started 3 weeks ago. currently no pain, just burning and numbness   HPI Patient was diagnosed with trigeminal neuralgia in 2018 and recently went to the ER on 10/03/2018 for facial pain and was considered trigeminal neuralgia episode and started on lamotrigine due to drug interaction with first line-carbamazepine and her Imbruvica, which she takes for chronic lymphocytic leukemia. Patient has seen neurology for facial pain last year, Dr.Potter. She has titrated up on the lamotrigine BID and states pain resolved, but still is having numbness and tinging under he mouth. She also has periodontal disease. States the numbness is so bad at times that she bites her gum without noticing it.  Patient Active Problem List   Diagnosis Date Noted  . Trigeminal neuralgia 10/05/2018  . Lumbar radiculitis (Right) 09/24/2018  . Hypocalcemia 06/03/2018  . Vitamin D insufficiency 06/03/2018  . Abnormal MRI, lumbar spine (04/20/2017) 06/03/2018  . Chronic hip pain (Right) 06/03/2018  . Spondylosis without myelopathy or radiculopathy, lumbar region 06/03/2018  . DDD (degenerative disc disease), lumbar 06/02/2018  . Lumbar facet hypertrophy 06/02/2018  . Lumbar facet arthropathy 06/02/2018  . Lumbar facet syndrome (Bilateral) (R>L) 06/02/2018  . Marijuana use 05/19/2018  . Chronic lower extremity pain (Primary Area of Pain) (Bilateral) (R>L) 05/13/2018  . Chronic low back pain (Secondary Area of Pain) (Bilateral) (R>L) w/ sciatica (Bilateral) 05/13/2018  . Chronic pain syndrome 05/13/2018  . Opiate use 05/13/2018  . Disorder of skeletal system 05/13/2018  . Pharmacologic therapy 05/13/2018  . Problems influencing health status 05/13/2018  . Mastalgia 05/05/2018  . Breast mass, right 01/07/2018  . Facial pain  12/11/2017  . Tingling 12/11/2017  . Thoracic aortic atherosclerosis ([place]) 08/20/2017  . Emphysema of lung ([place]) 08/20/2017  . Adductor tendinitis 04/23/2017  . Trochanteric bursitis of right hip 04/23/2017  . GERD without esophagitis 12/11/2016  . CLL (chronic lymphocytic leukemia) ([place]) 11/24/2016  . Pap smear abnormality of cervix/human papillomavirus (HPV) positive 09/12/2016  . Stress incontinence 09/04/2016  . B12 deficiency 07/10/2015  . Insomnia, persistent 07/10/2015  . Mild episode of recurrent major depressive disorder ([place]) 07/10/2015  . Anorexia nervosa, restricting type 07/10/2015  . Anxiety, generalized 07/10/2015  . H/O suicide attempt 07/10/2015  . Lymphocytosis 07/10/2015  . Obsessive-compulsive disorder 07/10/2015  . Tobacco use 07/10/2015  . History of cervical dysplasia 07/18/2014   Past Medical History:  Diagnosis Date  . Anxiety   . Bursitis    leg pain  . Cervical cancer ([place])    hx LEEP over 18 years ago.   . Cervical intraepithelial neoplasia I   . Chronic lymphocytic leukemia ([place]) 2018   Dr Grayland Ormond  . Depression   . Eating disorder   . History of self-harm   . Insomnia   . Obsession   . Pap smear abnormality of cervix with LGSIL   . Tobacco abuse   . Vitamin B12 deficiency (non anemic)    Past Surgical History:  Procedure Laterality Date  . BREAST BIOPSY Right 01/05/2018   US guided biopsy of 2 areas and 1 lymph node, MIXED INFLAMMATION AND GIANT CELL REACTION  . CERVICAL BIOPSY  W/ LOOP ELECTRODE EXCISION    . OTHER SURGICAL HISTORY     scar tissue removed from vocal cords  . TUBAL LIGATION     Social History   Tobacco  Use  . Smoking status: Current Every Day Smoker    Packs/day: 1.00    Years: 31.00    Pack years: 31.00    Start date: 09/25/1986  . Smokeless tobacco: Never Used  Substance Use Topics  . Alcohol use: Yes    Alcohol/week: 0.0 standard drinks    Comment: rarely    Current Outpatient Medications:  .   benzonatate (TESSALON PERLES) 100 MG capsule, Take 1 capsule (100 mg total) by mouth 3 (three) times daily as needed., Disp: 30 capsule, Rfl: 0 .  Calcium Carbonate-Vit D-Min (GNP CALCIUM 1200) 1200-1000 MG-UNIT CHEW, Chew 1,200 mg by mouth daily with breakfast. Take in combination with vitamin D and magnesium., Disp: 30 tablet, Rfl: 5 .  CHANTIX CONTINUING MONTH PAK 1 MG tablet, , Disp: , Rfl:  .  Cholecalciferol (VITAMIN D3) 5000 units CAPS, Take 1 capsule (5,000 Units total) by mouth daily with breakfast. Take along with calcium and magnesium., Disp: 30 capsule, Rfl: 5 .  Fluticasone-Umeclidin-Vilant (TRELEGY ELLIPTA) 100-62.5-25 MCG/INH AEPB, Inhale 1 puff into the lungs daily., Disp: 1 each, Rfl: 3 .  gabapentin (NEURONTIN) 100 MG capsule, Take 100 mg by mouth 2 (two) times daily. , Disp: , Rfl:  .  IMBRUVICA 420 MG TABS, TAKE 1 TABLET BY MOUTH DAILY, Disp: 28 tablet, Rfl: 2 .  lamoTRIgine (LAMICTAL) 25 MG tablet, Take 25 mg p.o. twice daily x2 weeks and then increase to 50 mg twice daily x2 weeks., Disp: 90 tablet, Rfl: 0 .  Magnesium 500 MG CAPS, Take 1 capsule (500 mg total) by mouth 2 (two) times daily at 8 am and 10 pm., Disp: 60 capsule, Rfl: 5 .  meloxicam (MOBIC) 15 MG tablet, Take 1 tablet (15 mg total) by mouth daily., Disp: 180 tablet, Rfl: 0 .  predniSONE (STERAPRED UNI-PAK 48 TAB) 5 MG (48) TBPK tablet, Take as directed, Disp: 48 tablet, Rfl: 0 .  tiZANidine (ZANAFLEX) 2 MG tablet, Take 1 tablet (2 mg total) by mouth every 8 (eight) hours as needed for muscle spasms., Disp: 90 tablet, Rfl: 5 No Known Allergies Review of Systems  Neurological: Negative for dizziness and headaches.   No other specific complaints in a complete review of systems (except as listed in HPI above). Objective Vitals:   10/20/18 0850  BP: 110/62  Pulse: 91  Resp: 14  Temp: 97.9 F (36.6 C)  TempSrc: Oral  SpO2: 97%  Weight: 159 lb 11.2 oz (72.4 kg)  Height: 5\' 3"  (1.6 m)   Body mass index is  28.29 kg/m. Nursing Note and Vital Signs reviewed. Physical Exam HENT:     Head: Normocephalic and atraumatic.  Neurological:     Mental Status: She is alert and oriented to person, place, and time.     GCS: GCS eye subscore is 4. GCS verbal subscore is 5. GCS motor subscore is 6.     Motor: Motor function is intact. No tremor.     Comments: Decrease sensation bilaterally to sharp pain in mandibular region  Psychiatric:        Attention and Perception: Attention normal.        Mood and Affect: Mood normal.        Speech: Speech normal.    ? No results found for this or any previous visit (from the past 48 hour(s)). Assessment & Plan 1. Trigeminal neuralgia - Follow-up with neurologist for symptom and medication management. -Ambulatory referral to Neurology

## 2018-10-20 NOTE — Progress Notes (Signed)
Name: Monique [name]   MRN: [id number]    DOB: 1970-11-24   Date:10/20/2018       Progress Note  Subjective  Chief Complaint  Chief Complaint  Patient presents with  . Trigeminal Neuralgia    HPI  Patient was diagnosed with trigeminal neuralgia in 2018 and recently when to ER on 10/03/2018 for facial pain and was considered trigeminal neuralgia episode and started on lamotrigine due to drug interaction with first line- carbamazepine and her Imbruvica which she takes for chronic lymphocytic leukemia. Patient has seen neurology for facial pain last year- Dr. Melrose Nakayama.  She has titrated up on the lamictal BID now and states pain resolved but is still having numbness and tingling and has increased at lip states she accidentally bit lip due not being aware of it.  + peridontal disease   Patient Active Problem List   Diagnosis Date Noted  . Trigeminal neuralgia 10/05/2018  . Lumbar radiculitis (Right) 09/24/2018  . Hypocalcemia 06/03/2018  . Vitamin D insufficiency 06/03/2018  . Abnormal MRI, lumbar spine (04/20/2017) 06/03/2018  . Chronic hip pain (Right) 06/03/2018  . Spondylosis without myelopathy or radiculopathy, lumbar region 06/03/2018  . DDD (degenerative disc disease), lumbar 06/02/2018  . Lumbar facet hypertrophy 06/02/2018  . Lumbar facet arthropathy 06/02/2018  . Lumbar facet syndrome (Bilateral) (R>L) 06/02/2018  . Marijuana use 05/19/2018  . Chronic lower extremity pain (Primary Area of Pain) (Bilateral) (R>L) 05/13/2018  . Chronic low back pain (Secondary Area of Pain) (Bilateral) (R>L) w/ sciatica (Bilateral) 05/13/2018  . Chronic pain syndrome 05/13/2018  . Opiate use 05/13/2018  . Disorder of skeletal system 05/13/2018  . Pharmacologic therapy 05/13/2018  . Problems influencing health status 05/13/2018  . Mastalgia 05/05/2018  . Breast mass, right 01/07/2018  . Facial pain 12/11/2017  . Tingling 12/11/2017  . Thoracic aortic atherosclerosis ([place]) 08/20/2017  .  Emphysema of lung ([place]) 08/20/2017  . Adductor tendinitis 04/23/2017  . Trochanteric bursitis of right hip 04/23/2017  . GERD without esophagitis 12/11/2016  . CLL (chronic lymphocytic leukemia) ([place]) 11/24/2016  . Pap smear abnormality of cervix/human papillomavirus (HPV) positive 09/12/2016  . Stress incontinence 09/04/2016  . B12 deficiency 07/10/2015  . Insomnia, persistent 07/10/2015  . Mild episode of recurrent major depressive disorder ([place]) 07/10/2015  . Anorexia nervosa, restricting type 07/10/2015  . Anxiety, generalized 07/10/2015  . H/O suicide attempt 07/10/2015  . Lymphocytosis 07/10/2015  . Obsessive-compulsive disorder 07/10/2015  . Tobacco use 07/10/2015  . History of cervical dysplasia 07/18/2014    Past Medical History:  Diagnosis Date  . Anxiety   . Bursitis    leg pain  . Cervical cancer ([place])    hx LEEP over 18 years ago.   . Cervical intraepithelial neoplasia I   . Chronic lymphocytic leukemia ([place]) 2018   Dr Grayland Ormond  . Depression   . Eating disorder   . History of self-harm   . Insomnia   . Obsession   . Pap smear abnormality of cervix with LGSIL   . Tobacco abuse   . Vitamin B12 deficiency (non anemic)     Past Surgical History:  Procedure Laterality Date  . BREAST BIOPSY Right 01/05/2018   US guided biopsy of 2 areas and 1 lymph node, MIXED INFLAMMATION AND GIANT CELL REACTION  . CERVICAL BIOPSY  W/ LOOP ELECTRODE EXCISION    . OTHER SURGICAL HISTORY     scar tissue removed from vocal cords  . TUBAL LIGATION      Social History  Tobacco Use  . Smoking status: Current Every Day Smoker    Packs/day: 1.00    Years: 31.00    Pack years: 31.00    Start date: 09/25/1986  . Smokeless tobacco: Never Used  Substance Use Topics  . Alcohol use: Yes    Alcohol/week: 0.0 standard drinks    Comment: rarely     Current Outpatient Medications:  .  benzonatate (TESSALON PERLES) 100 MG capsule, Take 1 capsule (100 mg total) by mouth 3 (three)  times daily as needed., Disp: 30 capsule, Rfl: 0 .  Calcium Carbonate-Vit D-Min (GNP CALCIUM 1200) 1200-1000 MG-UNIT CHEW, Chew 1,200 mg by mouth daily with breakfast. Take in combination with vitamin D and magnesium., Disp: 30 tablet, Rfl: 5 .  CHANTIX CONTINUING MONTH PAK 1 MG tablet, , Disp: , Rfl:  .  Cholecalciferol (VITAMIN D3) 5000 units CAPS, Take 1 capsule (5,000 Units total) by mouth daily with breakfast. Take along with calcium and magnesium., Disp: 30 capsule, Rfl: 5 .  Fluticasone-Umeclidin-Vilant (TRELEGY ELLIPTA) 100-62.5-25 MCG/INH AEPB, Inhale 1 puff into the lungs daily., Disp: 1 each, Rfl: 3 .  gabapentin (NEURONTIN) 100 MG capsule, Take 100 mg by mouth 2 (two) times daily. , Disp: , Rfl:  .  IMBRUVICA 420 MG TABS, TAKE 1 TABLET BY MOUTH DAILY, Disp: 28 tablet, Rfl: 2 .  lamoTRIgine (LAMICTAL) 25 MG tablet, Take 25 mg p.o. twice daily x2 weeks and then increase to 50 mg twice daily x2 weeks., Disp: 90 tablet, Rfl: 0 .  Magnesium 500 MG CAPS, Take 1 capsule (500 mg total) by mouth 2 (two) times daily at 8 am and 10 pm., Disp: 60 capsule, Rfl: 5 .  meloxicam (MOBIC) 15 MG tablet, Take 1 tablet (15 mg total) by mouth daily., Disp: 180 tablet, Rfl: 0 .  predniSONE (STERAPRED UNI-PAK 48 TAB) 5 MG (48) TBPK tablet, Take as directed, Disp: 48 tablet, Rfl: 0 .  tiZANidine (ZANAFLEX) 2 MG tablet, Take 1 tablet (2 mg total) by mouth every 8 (eight) hours as needed for muscle spasms., Disp: 90 tablet, Rfl: 5  No Known Allergies  ROS  No other specific complaints in a complete review of systems (except as listed in HPI above).  Objective  Vitals:   10/20/18 0850  Pulse: 91  Resp: 14  Temp: 97.9 F (36.6 C)  TempSrc: Oral  SpO2: 97%  Weight: 159 lb 11.2 oz (72.4 kg)  Height: 5\' 3"  (1.6 m)    Body mass index is 28.29 kg/m.  Nursing Note and Vital Signs reviewed.  Physical Exam Constitutional:      Appearance: Normal appearance.  HENT:     Head: Normocephalic and  atraumatic.     Nose: Nose normal.  Eyes:     Extraocular Movements: Extraocular movements intact.     Conjunctiva/sclera: Conjunctivae normal.     Pupils: Pupils are equal, round, and reactive to light.  Cardiovascular:     Rate and Rhythm: Normal rate.  Pulmonary:     Effort: Pulmonary effort is normal.  Skin:    General: Skin is warm and dry.  Neurological:     Mental Status: She is alert and oriented to person, place, and time.     Sensory: Sensory deficit (lower lip bilaterally ) present.     Motor: No weakness.     Gait: Gait normal.     Comments: No facial droop, slurred speech, or upper or lower limb weakness appreciable.  Psychiatric:  Mood and Affect: Mood normal.        Thought Content: Thought content normal.      No results found for this or any previous visit (from the past 48 hour(s)).  Assessment & Plan  1. Trigeminal neuralgia Continue lamotrigine.  - Ambulatory referral to Neurology    -Red flags and when to present for emergency care or RTC including fever >101.19F, chest pain, shortness of breath, new/worsening/un-resolving symptoms, neurological red flag symptoms  reviewed with patient at time of visit. Follow up and care instructions discussed and provided in AVS.

## 2018-10-20 NOTE — Patient Instructions (Addendum)
-   If you have numbness or tingling on ONE side please go to the ER - Please go to the neurologist for further evaluation - Continue the lamictal  Trigeminal Neuralgia  Trigeminal neuralgia is a nerve disorder that causes attacks of severe facial pain. The attacks last from a few seconds to several minutes. They can happen for days, weeks, or months and then go away for months or years. Trigeminal neuralgia is also called tic douloureux. What are the causes? This condition is caused by damage to a nerve in the face that is called the trigeminal nerve. An attack can be triggered by:  Talking.  Chewing.  Putting on makeup.  Washing your face.  Shaving your face.  Brushing your teeth.  Touching your face. What increases the risk? This condition is more likely to develop in:  Women.  People who are 17 years of age or older. What are the signs or symptoms? The main symptom of this condition is pain in the jaw, lips, eyes, nose, scalp, forehead, and face. The pain may be intense, stabbing, electric, or shock-like. How is this diagnosed? This condition is diagnosed with a physical exam. A CT scan or MRI may be done to rule out other conditions that can cause facial pain. How is this treated? This condition may be treated with:  Avoiding the things that trigger your attacks.  Pain medicine.  Surgery. This may be done in severe cases if other medical treatment does not provide relief. Follow these instructions at home:  Take over-the-counter and prescription medicines only as told by your health care provider.  If you wish to get pregnant, talk with your health care provider before you start trying to get pregnant.  Avoid the things that trigger your attacks. It may help to: ? Chew on the unaffected side of your mouth. ? Avoid touching your face. ? Avoid blasts of hot or cold air. Contact a health care provider if:  Your pain medicine is not helping.  You develop new,  unexplained symptoms, such as: ? Double vision. ? Facial weakness. ? Changes in hearing or balance.  You become pregnant. Get help right away if:  Your pain is unbearable, and your pain medicine does not help. This information is not intended to replace advice given to you by your health care provider. Make sure you discuss any questions you have with your health care provider. Document Released: 08/30/2000 Document Revised: 08/01/2017 Document Reviewed: 12/26/2014 Elsevier Interactive Patient Education  2019 [employer].

## 2018-10-22 ENCOUNTER — Inpatient Hospital Stay: Payer: BLUE CROSS/BLUE SHIELD | Attending: Oncology

## 2018-10-22 ENCOUNTER — Ambulatory Visit
Admission: RE | Admit: 2018-10-22 | Discharge: 2018-10-22 | Disposition: A | Discharge status: Home / Self Care | Payer: BLUE CROSS/BLUE SHIELD | Source: Ambulatory Visit | Attending: Oncology | Admitting: Oncology

## 2018-10-22 DIAGNOSIS — G500 Trigeminal neuralgia: Secondary | ICD-10-CM | POA: Diagnosis not present

## 2018-10-22 DIAGNOSIS — N390 Urinary tract infection, site not specified: Secondary | ICD-10-CM | POA: Insufficient documentation

## 2018-10-22 DIAGNOSIS — C9110 Chronic lymphocytic leukemia of B-cell type not having achieved remission: Secondary | ICD-10-CM | POA: Insufficient documentation

## 2018-10-22 DIAGNOSIS — Z720 Tobacco use: Secondary | ICD-10-CM | POA: Insufficient documentation

## 2018-10-22 DIAGNOSIS — D72829 Elevated white blood cell count, unspecified: Secondary | ICD-10-CM | POA: Diagnosis not present

## 2018-10-22 LAB — COMPREHENSIVE METABOLIC PANEL
ALT: 13 U/L (ref 0–44)
AST: 16 U/L (ref 15–41)
Albumin: 3.7 g/dL (ref 3.5–5.0)
Alkaline Phosphatase: 48 U/L (ref 38–126)
Anion gap: 5 (ref 5–15)
BUN: 21 mg/dL — AB (ref 6–20)
CHLORIDE: 106 mmol/L (ref 98–111)
CO2: 26 mmol/L (ref 22–32)
Calcium: 8.3 mg/dL — ABNORMAL LOW (ref 8.9–10.3)
Creatinine, Ser: 0.73 mg/dL (ref 0.44–1.00)
GFR calc Af Amer: >60 mL/min (ref >60)
GFR calc non Af Amer: >60 mL/min (ref >60)
Glucose, Bld: 73 mg/dL (ref 70–99)
Potassium: 4.1 mmol/L (ref 3.5–5.1)
Sodium: 137 mmol/L (ref 135–145)
Total Bilirubin: 0.6 mg/dL (ref 0.3–1.2)
Total Protein: 6.3 g/dL — ABNORMAL LOW (ref 6.5–8.1)

## 2018-10-22 LAB — CBC WITH DIFFERENTIAL/PLATELET
Abs Immature Granulocytes: 0.11 10*3/uL — ABNORMAL HIGH (ref 0.00–0.07)
Basophils Absolute: 0.1 10*3/uL (ref 0.0–0.1)
Basophils Relative: 0 %
Eosinophils Absolute: 0.2 10*3/uL (ref 0.0–0.5)
Eosinophils Relative: 1 %
HCT: 41.4 % (ref 36.0–46.0)
Hemoglobin: 13.1 g/dL (ref 12.0–15.0)
IMMATURE GRANULOCYTES: 0 %
Lymphocytes Relative: 79 %
Lymphs Abs: 25.6 10*3/uL — ABNORMAL HIGH (ref 0.7–4.0)
MCH: 30.9 pg (ref 26.0–34.0)
MCHC: 31.6 g/dL (ref 30.0–36.0)
MCV: 97.6 fL (ref 80.0–100.0)
MONOS PCT: 2 %
Monocytes Absolute: 0.7 10*3/uL (ref 0.1–1.0)
Neutro Abs: 6 10*3/uL (ref 1.7–7.7)
Neutrophils Relative %: 18 %
Platelets: 233 10*3/uL (ref 150–400)
RBC: 4.24 MIL/uL (ref 3.87–5.11)
RDW: 13.2 % (ref 11.5–15.5)
WBC: 32.7 10*3/uL — ABNORMAL HIGH (ref 4.0–10.5)
nRBC: 0 % (ref 0.0–0.2)

## 2018-10-22 LAB — MAGNESIUM: Magnesium: 2 mg/dL (ref 1.7–2.4)

## 2018-10-22 LAB — PHOSPHORUS: PHOSPHORUS: 3.3 mg/dL (ref 2.5–4.6)

## 2018-10-22 MED ORDER — IOPAMIDOL (ISOVUE-300) INJECTION 61%
100.0000 mL | Freq: Once | INTRAVENOUS | Status: AC | PRN
  Administered 2018-10-22: 100 mL via INTRAVENOUS

## 2018-10-23 NOTE — Progress Notes (Signed)
[facility]  [phone] [phone]  ID: Monique [name] OB: 1970-09-23  MR#: [id number]  YQM#:[id number]  Patient Care Team: [name], MD as PCP - General (Family Medicine) Lloyd Huger, MD as Consulting Physician (Oncology)  CHIEF COMPLAINT: CLL.  INTERVAL HISTORY: Patient returns to clinic today for further evaluation and discussion of her imaging results.  She was recently in the emergency room and diagnosed with trigeminal neuralgia.  She continues to have numbness in her chin, but no other pain or neurologic complaints.  She otherwise feels well.  She denies any fevers or night sweats. She has no chest pain or shortness of breath. She denies any nausea, vomiting, constipation, or diarrhea.  She has no urinary complaints.  Patient offers no further specific complaints today.  REVIEW OF SYSTEMS:   Review of Systems  Constitutional: Negative for diaphoresis, fever, malaise/fatigue and weight loss.  HENT: Negative.  Negative for congestion.   Respiratory: Negative.  Negative for cough and shortness of breath.   Cardiovascular: Negative.  Negative for chest pain and leg swelling.  Gastrointestinal: Negative.  Negative for abdominal pain and constipation.  Genitourinary: Negative.  Negative for dysuria and frequency.  Musculoskeletal: Negative.  Negative for myalgias and neck pain.  Skin: Negative.   Neurological: Positive for sensory change. Negative for tingling, focal weakness and weakness.  Psychiatric/Behavioral: The patient is nervous/anxious.     As per HPI. Otherwise, a complete review of systems is negative.  PAST MEDICAL HISTORY: Past Medical History:  Diagnosis Date  . Anxiety   . Bursitis    leg pain  . Cervical cancer ([place])    hx LEEP over 18 years ago.   . Cervical intraepithelial neoplasia I   . Chronic lymphocytic leukemia ([place]) 2018   Dr Grayland Ormond  . Depression   . Eating disorder   . History of  self-harm   . Insomnia   . Obsession   . Pap smear abnormality of cervix with LGSIL   . Tobacco abuse   . Vitamin B12 deficiency (non anemic)     PAST SURGICAL HISTORY: Past Surgical History:  Procedure Laterality Date  . BREAST BIOPSY Right 01/05/2018   US guided biopsy of 2 areas and 1 lymph node, MIXED INFLAMMATION AND GIANT CELL REACTION  . CERVICAL BIOPSY  W/ LOOP ELECTRODE EXCISION    . OTHER SURGICAL HISTORY     scar tissue removed from vocal cords  . TUBAL LIGATION      FAMILY HISTORY: Family History  Problem Relation Age of Onset  . Cancer Mother        thyroid  . Alcohol abuse Father   . Alcohol abuse Brother   . Depression Brother   . Bipolar disorder Brother   . ADD / ADHD Son   . Breast cancer Neg Hx     ADVANCED DIRECTIVES (Y/N):  N  HEALTH MAINTENANCE: Social History   Tobacco Use  . Smoking status: Current Every Day Smoker    Packs/day: 0.50    Years: 31.00    Pack years: 15.50    Types: Cigarettes    Start date: 09/25/1986  . Smokeless tobacco: Never Used  . Tobacco comment: taking chantix again and is cutting down on amount   Substance Use Topics  . Alcohol use: Yes    Alcohol/week: 0.0 standard drinks    Comment: rarely  . Drug use: Yes    Types: Marijuana     Colonoscopy:  PAP:  Bone density:  Lipid panel:  No Known Allergies  Current Outpatient Medications  Medication Sig Dispense Refill  . Calcium Carbonate-Vit D-Min (GNP CALCIUM 1200) 1200-1000 MG-UNIT CHEW Chew 1,200 mg by mouth daily with breakfast. Take in combination with vitamin D and magnesium. 30 tablet 5  . CHANTIX CONTINUING MONTH PAK 1 MG tablet     . Cholecalciferol (VITAMIN D3) 5000 units CAPS Take 1 capsule (5,000 Units total) by mouth daily with breakfast. Take along with calcium and magnesium. 30 capsule 5  . ciprofloxacin (CIPRO) 250 MG tablet Take 1 tablet (250 mg total) by mouth 2 (two) times daily. 6 tablet 0  . Fluticasone-Umeclidin-Vilant (TRELEGY ELLIPTA)  100-62.5-25 MCG/INH AEPB Inhale 1 puff into the lungs daily. 1 each 3  . gabapentin (NEURONTIN) 100 MG capsule Take 100 mg by mouth 2 (two) times daily.     . IMBRUVICA 420 MG TABS TAKE 1 TABLET BY MOUTH DAILY 28 tablet 2  . lamoTRIgine (LAMICTAL) 100 MG tablet Take 1 tablet (100 mg total) by mouth 2 (two) times daily. . 180 tablet 1  . Magnesium 500 MG CAPS Take 1 capsule (500 mg total) by mouth 2 (two) times daily at 8 am and 10 pm. 60 capsule 5  . meloxicam (MOBIC) 15 MG tablet Take 1 tablet (15 mg total) by mouth daily. 180 tablet 0  . tiZANidine (ZANAFLEX) 2 MG tablet Take 1 tablet (2 mg total) by mouth every 8 (eight) hours as needed for muscle spasms. 90 tablet 5   No current facility-administered medications for this visit.     OBJECTIVE: Vitals:   10/29/18 1531  BP: 113/67  Pulse: 72  Temp: 97.6 F (36.4 C)     Body mass index is 24.9 kg/m.    ECOG FS:0 - Asymptomatic  General: Well-developed, well-nourished, no acute distress. Eyes: Pink conjunctiva, anicteric sclera. HEENT: Normocephalic, moist mucous membranes. Lungs: Clear to auscultation bilaterally. Heart: Regular rate and rhythm. No rubs, murmurs, or gallops. Abdomen: Soft, nontender, nondistended. No organomegaly noted, normoactive bowel sounds. Musculoskeletal: No edema, cyanosis, or clubbing. Neuro: Alert, answering all questions appropriately. Cranial nerves grossly intact. Skin: No rashes or petechiae noted. Psych: Normal affect. Lymphatics: No cervical, calvicular, axillary or inguinal LAD.  LAB RESULTS:  Lab Results  Component Value Date   NA 137 10/22/2018   K 4.1 10/22/2018   CL 106 10/22/2018   CO2 26 10/22/2018   GLUCOSE 73 10/22/2018   BUN 21 (H) 10/22/2018   CREATININE 0.73 10/22/2018   CALCIUM 8.3 (L) 10/22/2018   PROT 6.3 (L) 10/22/2018   ALBUMIN 3.7 10/22/2018   AST 16 10/22/2018   ALT 13 10/22/2018   ALKPHOS 48 10/22/2018   BILITOT 0.6 10/22/2018   GFRNONAA >60 10/22/2018   GFRAA  >60 10/22/2018    Lab Results  Component Value Date   WBC 32.7 (H) 10/22/2018   NEUTROABS 6.0 10/22/2018   HGB 13.1 10/22/2018   HCT 41.4 10/22/2018   MCV 97.6 10/22/2018   PLT 233 10/22/2018     STUDIES: Ct Soft Tissue Neck W Contrast  Result Date: 10/22/2018 CLINICAL DATA:  Chronic lymphocytic leukemia. EXAM: CT NECK WITH CONTRAST TECHNIQUE: Multidetector CT imaging of the neck was performed using the standard protocol following the bolus administration of intravenous contrast. CONTRAST:  170m ISOVUE-300 IOPAMIDOL (ISOVUE-300) INJECTION 61% COMPARISON:  07/14/2018. FINDINGS: Pharynx and larynx: Normal. No mass or swelling. Salivary glands: No inflammation, mass, or stone. Thyroid: Normal. Lymph nodes: Regression of all previously noted cervical adenopathy. Index level IIb  lymph node on the RIGHT measuring 8 mm short axis measures only 3 mm today (image 43). Similar more inferior RIGHT level IIa node measures 5 mm today compared with 9 mm prior (image 49). Similar improvement in widespread adenopathy elsewhere. Vascular: Patent. Limited intracranial: Negative. Visualized orbits: Negative. Mastoids and visualized paranasal sinuses: Clear. Skeleton: No acute or aggressive process. Upper chest: Negative. Other: None IMPRESSION: Response to treatment with regression of previously noted cervical adenopathy. See discussion above. Electronically Signed   By: Staci Righter M.D.   On: 10/22/2018 13:56   Ct Chest W Contrast  Result Date: 10/22/2018 CLINICAL DATA:  CLL/SLL.  Restaging after interval therapy. EXAM: CT CHEST, ABDOMEN, AND PELVIS WITH CONTRAST TECHNIQUE: Multidetector CT imaging of the chest, abdomen and pelvis was performed following the standard protocol during bolus administration of intravenous contrast. CONTRAST:  144m ISOVUE-300 IOPAMIDOL (ISOVUE-300) INJECTION 61% COMPARISON:  07/14/2018 CT chest, abdomen and pelvis. FINDINGS: CT CHEST FINDINGS Cardiovascular: Normal heart size. No  significant pericardial effusion/thickening. Great vessels are normal in course and caliber. No central pulmonary emboli. Mediastinum/Nodes: No discrete thyroid nodules. Unremarkable esophagus. Mild bilateral axillary lymphadenopathy, significantly decreased bilaterally. For example, a 1.3 cm left axillary node (series 3/image 17), decreased from 2.1 cm. Right axillary 1.0 cm node (series 3/image 17), decreased from 0.5 cm. Right paratracheal adenopathy is decreased. For example a 0.9 cm right paratracheal node (series 3/image 21), decreased from 1.6 cm. Resolved retropectoral, subcarinal and hilar adenopathy. No newly enlarged or enlarging thoracic nodes. Lungs/Pleura: No pneumothorax. No pleural effusion. Mild centrilobular emphysema mild diffuse bronchial wall thickening. No acute consolidative airspace disease or lung masses. Scattered small solid pulmonary nodules in both lungs measuring up to 4 mm in the right middle lobe (series 5/image 77), all stable since 01/21/2017 CT, considered benign. No new significant pulmonary nodules. Musculoskeletal:  No aggressive appearing focal osseous lesions. CT ABDOMEN PELVIS FINDINGS Hepatobiliary: Normal liver size. Tiny hypodense subcentimeter posterior right liver lobe lesions are too small to characterize and are stable since 01/21/2017 CT, considered benign. No new liver lesions. Normal gallbladder with no radiopaque cholelithiasis. No biliary ductal dilatation. Pancreas: Normal, with no mass or duct dilation. Spleen: Normal size. No mass. Adrenals/Urinary Tract: Stable appearance of the adrenal glands back to 01/21/2017 CT, without discrete adrenal nodules. Normal kidneys with no hydronephrosis and no renal mass. Nondistended bladder. Suggestion of new mild diffuse bladder wall thickening and mucosal hyperenhancement. Stomach/Bowel: Normal non-distended stomach. Normal caliber small bowel with no small bowel wall thickening. Normal appendix. Oral contrast transits to  the left colon. Normal large bowel with no diverticulosis, large bowel wall thickening or pericolonic fat stranding. Vascular/Lymphatic: Atherosclerotic nonaneurysmal abdominal aorta. Patent portal, splenic, hepatic and renal veins. Mildly enlarged 1.3 cm portacaval node (series 3/image 63), decreased from 2.4 cm. Decreased mesenteric adenopathy. For example a 1.4 cm left mesenteric node (series 3/image 88), decreased from 2.4 cm. Decreased retroperitoneal adenopathy. For example a 3.0 cm left para-aortic node (series 3/image 81), decreased from 3.8 cm. A 1.2 cm aortocaval node (series 3/image 75) is decreased from 2.0 cm. Decreased bilateral common and external iliac lymphadenopathy. For example a 1.4 cm right external iliac node (series 3/image 112), decreased from 2.7 cm. A 1.5 cm left external iliac node (series 3/image 113), decreased from 3.2 cm. A 1.0 cm right common iliac node (series 3/image 96), decreased from 2.0 cm. Reproductive: Grossly normal uterus.  No adnexal mass. Other: No pneumoperitoneum, ascites or focal fluid collection. Musculoskeletal: No aggressive appearing focal osseous  lesions. IMPRESSION: 1. Significant response to therapy. Bilateral axillary, retropectoral, mediastinal, hilar, retroperitoneal, mesenteric and bilateral pelvic lymphadenopathy have all significantly decreased. No new or progressive findings. Spleen remains normal size. 2. Suggestion of new mild diffuse bladder wall thickening. Correlation with urinalysis may be obtained as clinically warranted to exclude acute cystitis. 3. Aortic Atherosclerosis (ICD10-I70.0) and Emphysema (ICD10-J43.9). Electronically Signed   By: Ilona Sorrel M.D.   On: 10/22/2018 15:00   Ct Abdomen Pelvis W Contrast  Result Date: 10/22/2018 CLINICAL DATA:  CLL/SLL.  Restaging after interval therapy. EXAM: CT CHEST, ABDOMEN, AND PELVIS WITH CONTRAST TECHNIQUE: Multidetector CT imaging of the chest, abdomen and pelvis was performed following the  standard protocol during bolus administration of intravenous contrast. CONTRAST:  133m ISOVUE-300 IOPAMIDOL (ISOVUE-300) INJECTION 61% COMPARISON:  07/14/2018 CT chest, abdomen and pelvis. FINDINGS: CT CHEST FINDINGS Cardiovascular: Normal heart size. No significant pericardial effusion/thickening. Great vessels are normal in course and caliber. No central pulmonary emboli. Mediastinum/Nodes: No discrete thyroid nodules. Unremarkable esophagus. Mild bilateral axillary lymphadenopathy, significantly decreased bilaterally. For example, a 1.3 cm left axillary node (series 3/image 17), decreased from 2.1 cm. Right axillary 1.0 cm node (series 3/image 17), decreased from 0.5 cm. Right paratracheal adenopathy is decreased. For example a 0.9 cm right paratracheal node (series 3/image 21), decreased from 1.6 cm. Resolved retropectoral, subcarinal and hilar adenopathy. No newly enlarged or enlarging thoracic nodes. Lungs/Pleura: No pneumothorax. No pleural effusion. Mild centrilobular emphysema mild diffuse bronchial wall thickening. No acute consolidative airspace disease or lung masses. Scattered small solid pulmonary nodules in both lungs measuring up to 4 mm in the right middle lobe (series 5/image 77), all stable since 01/21/2017 CT, considered benign. No new significant pulmonary nodules. Musculoskeletal:  No aggressive appearing focal osseous lesions. CT ABDOMEN PELVIS FINDINGS Hepatobiliary: Normal liver size. Tiny hypodense subcentimeter posterior right liver lobe lesions are too small to characterize and are stable since 01/21/2017 CT, considered benign. No new liver lesions. Normal gallbladder with no radiopaque cholelithiasis. No biliary ductal dilatation. Pancreas: Normal, with no mass or duct dilation. Spleen: Normal size. No mass. Adrenals/Urinary Tract: Stable appearance of the adrenal glands back to 01/21/2017 CT, without discrete adrenal nodules. Normal kidneys with no hydronephrosis and no renal mass.  Nondistended bladder. Suggestion of new mild diffuse bladder wall thickening and mucosal hyperenhancement. Stomach/Bowel: Normal non-distended stomach. Normal caliber small bowel with no small bowel wall thickening. Normal appendix. Oral contrast transits to the left colon. Normal large bowel with no diverticulosis, large bowel wall thickening or pericolonic fat stranding. Vascular/Lymphatic: Atherosclerotic nonaneurysmal abdominal aorta. Patent portal, splenic, hepatic and renal veins. Mildly enlarged 1.3 cm portacaval node (series 3/image 63), decreased from 2.4 cm. Decreased mesenteric adenopathy. For example a 1.4 cm left mesenteric node (series 3/image 88), decreased from 2.4 cm. Decreased retroperitoneal adenopathy. For example a 3.0 cm left para-aortic node (series 3/image 81), decreased from 3.8 cm. A 1.2 cm aortocaval node (series 3/image 75) is decreased from 2.0 cm. Decreased bilateral common and external iliac lymphadenopathy. For example a 1.4 cm right external iliac node (series 3/image 112), decreased from 2.7 cm. A 1.5 cm left external iliac node (series 3/image 113), decreased from 3.2 cm. A 1.0 cm right common iliac node (series 3/image 96), decreased from 2.0 cm. Reproductive: Grossly normal uterus.  No adnexal mass. Other: No pneumoperitoneum, ascites or focal fluid collection. Musculoskeletal: No aggressive appearing focal osseous lesions. IMPRESSION: 1. Significant response to therapy. Bilateral axillary, retropectoral, mediastinal, hilar, retroperitoneal, mesenteric and bilateral pelvic lymphadenopathy have  all significantly decreased. No new or progressive findings. Spleen remains normal size. 2. Suggestion of new mild diffuse bladder wall thickening. Correlation with urinalysis may be obtained as clinically warranted to exclude acute cystitis. 3. Aortic Atherosclerosis (ICD10-I70.0) and Emphysema (ICD10-J43.9). Electronically Signed   By: Ilona Sorrel M.D.   On: 10/22/2018 15:00     ASSESSMENT: CLL  PLAN:    1. CLL: Confirmed by peripheral blood flow cytometry.  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  CT scan results from October 22, 2018 reviewed independently and reported as above with significant improvement of disease burden.  Patient completed cycle 3 of Rituxan plus Treanda on March 26, 2018, but was noted to have progressive disease therefore initiated 480 mg Imbruvica daily.  Continue current treatment indefinitely or until progression of disease.  Return to clinic in 6 weeks for laboratory work only and then in 3 months for laboratory work and further evaluation.  Plan to reimage in 6 months. 2.  Leukocytosis: Likely related to CLL and treatment.  Monitor.   3.  Trigeminal neuralgia: Appears to be unrelated to CLL or Imbruvica.  Continue follow-up with neurology as scheduled. 4.  UTI: Patient reports she is completing antibiotics for UTI.  She states her primary care was considering a referral to urology.  Patient expressed understanding and was in agreement with this plan. She also understands that She can call clinic at any time with any questions, concerns, or complaints.    Lloyd Huger, MD   10/30/2018 12:43 PM

## 2018-10-27 ENCOUNTER — Ambulatory Visit (INDEPENDENT_AMBULATORY_CARE_PROVIDER_SITE_OTHER): Payer: BLUE CROSS/BLUE SHIELD | Admitting: Family Medicine

## 2018-10-27 ENCOUNTER — Encounter: Payer: Self-pay | Admitting: Family Medicine

## 2018-10-27 VITALS — BP 104/62 | HR 81 | Temp 97.4°F | Resp 16 | Ht 63.0 in | Wt 158.9 lb

## 2018-10-27 DIAGNOSIS — C9110 Chronic lymphocytic leukemia of B-cell type not having achieved remission: Secondary | ICD-10-CM

## 2018-10-27 DIAGNOSIS — J438 Other emphysema: Secondary | ICD-10-CM | POA: Diagnosis not present

## 2018-10-27 DIAGNOSIS — R399 Unspecified symptoms and signs involving the genitourinary system: Secondary | ICD-10-CM

## 2018-10-27 DIAGNOSIS — Z113 Encounter for screening for infections with a predominantly sexual mode of transmission: Secondary | ICD-10-CM | POA: Diagnosis not present

## 2018-10-27 DIAGNOSIS — I700 Atherosclerosis of aorta: Secondary | ICD-10-CM

## 2018-10-27 DIAGNOSIS — G500 Trigeminal neuralgia: Secondary | ICD-10-CM

## 2018-10-27 LAB — POCT URINALYSIS DIPSTICK
Bilirubin, UA: NEGATIVE
Glucose, UA: NEGATIVE
Ketones, UA: NEGATIVE
Nitrite, UA: NEGATIVE
Protein, UA: POSITIVE — AB
Spec Grav, UA: 1.02 (ref 1.010–1.025)
Urobilinogen, UA: 0.2 E.U./dL
pH, UA: 5 (ref 5.0–8.0)

## 2018-10-27 MED ORDER — CIPROFLOXACIN HCL 250 MG PO TABS: 250.0000 mg | ORAL_TABLET | Freq: Two times a day (BID) | ORAL | 0 refills | Status: DC

## 2018-10-27 MED ORDER — LAMOTRIGINE 100 MG PO TABS: 100.0000 mg | ORAL_TABLET | Freq: Two times a day (BID) | ORAL | 1 refills | Status: DC

## 2018-10-27 NOTE — Progress Notes (Signed)
Name: Monique [name]   MRN: [id number]    DOB: 1970-11-06   Date:10/27/2018       Progress Note  Subjective  Chief Complaint  Chief Complaint  Patient presents with  . Urinary Tract Infection    Onset-10/20/18-pressure, sensation after going to the bathroom straight to her jaw area, frequency, hesitancy, dark yellow urine, urine odor    HPI  Trigeminal Neuralgia: she had an episode in the past, however severe symptoms on 10/03/2018 she went to [facility], she was given Lamictal and is up to 50 mg BID. Pain this time was intense and on both sides, radiating to her jaw and teeth was very sensitive. Pain is gone but still has numbness on chin area and lip and has follow up with neurologist on March 4 th, 2020  CLL: seeing oncologist, she is on oral medication and has been doing well, however had to come the pills for a procedure and also took oral steroids for trigeminal neuralgia, and noticed that lymphadenopathy had progressed when off medication, she will have to come off again to get steroid injection on her spine by Dr. Wynona Canes soon  Low back pain and radiculitis: she is on gabapentin , pain not as intense with medication, intermittently now and about 3/10, only gets injection when pain is severe but not bad at this time.   Urinary urgency: symptoms started about one week ago,  she  noticed urinary frequency, urgency, no dysuria, she feels fullness on her vaginal area, no hematuria, no vaginal discharge. Denies intercourse in the past few weeks. No change in sexual partners.   Emphysema: she is trying to quit smoking again, using Chantix, states trelegy seems to help with cough, only has occasional symptoms, not very active to know about SOB but denies wheezing.   Atherosclerosis: not on statin therapy, and cannot take blood thinners while on chemo drug.    Patient Active Problem List   Diagnosis Date Noted  . Trigeminal neuralgia 10/05/2018  . Lumbar radiculitis (Right) 09/24/2018  .  Hypocalcemia 06/03/2018  . Vitamin D insufficiency 06/03/2018  . Abnormal MRI, lumbar spine (04/20/2017) 06/03/2018  . Chronic hip pain (Right) 06/03/2018  . Spondylosis without myelopathy or radiculopathy, lumbar region 06/03/2018  . DDD (degenerative disc disease), lumbar 06/02/2018  . Lumbar facet hypertrophy 06/02/2018  . Lumbar facet arthropathy 06/02/2018  . Lumbar facet syndrome (Bilateral) (R>L) 06/02/2018  . Marijuana use 05/19/2018  . Chronic lower extremity pain (Primary Area of Pain) (Bilateral) (R>L) 05/13/2018  . Chronic low back pain (Secondary Area of Pain) (Bilateral) (R>L) w/ sciatica (Bilateral) 05/13/2018  . Chronic pain syndrome 05/13/2018  . Opiate use 05/13/2018  . Disorder of skeletal system 05/13/2018  . Pharmacologic therapy 05/13/2018  . Problems influencing health status 05/13/2018  . Mastalgia 05/05/2018  . Breast mass, right 01/07/2018  . Facial pain 12/11/2017  . Tingling 12/11/2017  . Thoracic aortic atherosclerosis ([place]) 08/20/2017  . Emphysema of lung ([place]) 08/20/2017  . Adductor tendinitis 04/23/2017  . Trochanteric bursitis of right hip 04/23/2017  . GERD without esophagitis 12/11/2016  . CLL (chronic lymphocytic leukemia) ([place]) 11/24/2016  . Pap smear abnormality of cervix/human papillomavirus (HPV) positive 09/12/2016  . Stress incontinence 09/04/2016  . B12 deficiency 07/10/2015  . Insomnia, persistent 07/10/2015  . Mild episode of recurrent major depressive disorder ([place]) 07/10/2015  . Anorexia nervosa, restricting type 07/10/2015  . Anxiety, generalized 07/10/2015  . H/O suicide attempt 07/10/2015  . Lymphocytosis 07/10/2015  . Obsessive-compulsive disorder 07/10/2015  .  Tobacco use 07/10/2015  . History of cervical dysplasia 07/18/2014    Past Surgical History:  Procedure Laterality Date  . BREAST BIOPSY Right 01/05/2018   US guided biopsy of 2 areas and 1 lymph node, MIXED INFLAMMATION AND GIANT CELL REACTION  . CERVICAL BIOPSY  W/  LOOP ELECTRODE EXCISION    . OTHER SURGICAL HISTORY     scar tissue removed from vocal cords  . TUBAL LIGATION      Family History  Problem Relation Age of Onset  . Cancer Mother        thyroid  . Alcohol abuse Father   . Alcohol abuse Brother   . Depression Brother   . Bipolar disorder Brother   . ADD / ADHD Son   . Breast cancer Neg Hx     Social History   Socioeconomic History  . Marital status: Divorced    Spouse name: Not on file  . Number of children: 3  . Years of education: Not on file  . Highest education level: Not on file  Occupational History  . Occupation: Best boy: [employer]  . Financial resource strain: Somewhat hard  . Food insecurity:    Worry: Never true    Inability: Never true  . Transportation needs:    Medical: No    Non-medical: No  Tobacco Use  . Smoking status: Current Every Day Smoker    Packs/day: 0.50    Years: 31.00    Pack years: 15.50    Types: Cigarettes    Start date: 09/25/1986  . Smokeless tobacco: Never Used  . Tobacco comment: taking chantix again and is cutting down on amount   Substance and Sexual Activity  . Alcohol use: Yes    Alcohol/week: 0.0 standard drinks    Comment: rarely  . Drug use: Yes    Types: Marijuana  . Sexual activity: Yes    Partners: Male    Birth control/protection: None  Lifestyle  . Physical activity:    Days per week: 5 days    Minutes per session: 30 min  . Stress: Not at all  Relationships  . Social connections:    Talks on phone: More than three times a week    Gets together: Twice a week    Attends religious service: 1 to 4 times per year    Active member of club or organization: Yes    Attends meetings of clubs or organizations: 1 to 4 times per year    Relationship status: Divorced  . Intimate partner violence:    Fear of current or ex partner: No    Emotionally abused: No    Physically abused: No    Forced sexual activity: No  Other Topics Concern  .  Not on file  Social History Narrative  . Not on file     Current Outpatient Medications:  .  Calcium Carbonate-Vit D-Min (GNP CALCIUM 1200) 1200-1000 MG-UNIT CHEW, Chew 1,200 mg by mouth daily with breakfast. Take in combination with vitamin D and magnesium., Disp: 30 tablet, Rfl: 5 .  CHANTIX CONTINUING MONTH PAK 1 MG tablet, , Disp: , Rfl:  .  Cholecalciferol (VITAMIN D3) 5000 units CAPS, Take 1 capsule (5,000 Units total) by mouth daily with breakfast. Take along with calcium and magnesium., Disp: 30 capsule, Rfl: 5 .  Fluticasone-Umeclidin-Vilant (TRELEGY ELLIPTA) 100-62.5-25 MCG/INH AEPB, Inhale 1 puff into the lungs daily., Disp: 1 each, Rfl: 3 .  gabapentin (NEURONTIN) 100 MG capsule,  Take 100 mg by mouth 2 (two) times daily. , Disp: , Rfl:  .  IMBRUVICA 420 MG TABS, TAKE 1 TABLET BY MOUTH DAILY, Disp: 28 tablet, Rfl: 2 .  lamoTRIgine (LAMICTAL) 100 MG tablet, Take 1 tablet (100 mg total) by mouth 2 (two) times daily. ., Disp: 180 tablet, Rfl: 1 .  Magnesium 500 MG CAPS, Take 1 capsule (500 mg total) by mouth 2 (two) times daily at 8 am and 10 pm., Disp: 60 capsule, Rfl: 5 .  meloxicam (MOBIC) 15 MG tablet, Take 1 tablet (15 mg total) by mouth daily., Disp: 180 tablet, Rfl: 0 .  tiZANidine (ZANAFLEX) 2 MG tablet, Take 1 tablet (2 mg total) by mouth every 8 (eight) hours as needed for muscle spasms., Disp: 90 tablet, Rfl: 5  No Known Allergies  I personally reviewed active problem list, medication list, allergies, family history, social history with the patient/caregiver today.   ROS  Ten systems reviewed and is negative except as mentioned in HPI   Objective  Vitals:   10/27/18 0951  BP: 104/62  Pulse: 81  Resp: 16  Temp: (!) 97.4 F (36.3 C)  TempSrc: Oral  SpO2: 97%  Weight: 158 lb 14.4 oz (72.1 kg)  Height: 5' 3"  (1.6 m)    Body mass index is 28.15 kg/m.  Physical Exam  Constitutional: Patient appears well-developed and well-nourished.  No distress.  HEENT:  head atraumatic, normocephalic, pupils equal and reactive to light,  neck supple, throat within normal limits Cardiovascular: Normal rate, regular rhythm and normal heart sounds.  No murmur heard. No BLE edema. Pulmonary/Chest: Effort normal and breath sounds normal. No respiratory distress. Abdominal: Soft.  There is no tenderness. Cranial nerves intact, no pain during palpation of trigeminal nerve Psychiatric: Patient has a normal mood and affect. behavior is normal. Judgment and thought content normal.  Recent Results (from the past 2160 hour(s))  Miscellaneous [facility] test (send-out)     Status: None   Collection Time: 08/18/18  2:44 PM  Result Value Ref Range   [facility] test code [id number]     Comment: Performed at [facility], [address], [place], [place] [address]   [facility] test name IGVH SOMATIC HYPERMUTATION     Comment: Performed at [facility], [address] [place], [place] [address] CORRECTED ON 12/12 AT 8657: PREVIOUSLY REPORTED AS [id number]    Misc [facility] result COMMENT     Comment: (NOTE) Test Ordered: [id number] IgVH Somatic Hypermutation Specimen Status                Comment                   BN   Reference lab report sent via fax. Methodology:                   Comment                   ACCAZ   The Immunoglobulin Heavy Chain Gene Variable Region (IgVH) Somatic Hypermutation Analysis assay is performed using extracted patient RNA as starting material. Subsequent amplification of the IgH gene is performed by polymerase chain reaction (PCR). The PCR products are isolated and sequenced. The nucleotide sequence is comparted to a consensus germline sequence database for the [facility] gene family. The results are reported as percentage of homology between the patient's [facility] sequence in comparison with the germline VH sequence using the Basic Local Alignment Search Tool (BLAST) for the Immunoglobulin database  at ItsBlog.fr. Intended Use:                   Comment                   ACCAZ   The IgVH gene mutation status is one of the discriminato rs of clinical outcome of patients with Chronic Lymphocytic Leukemia (CLL). The results of this analysis are to be interpreted in the context of flow cytometry, hematopathological findings and other clinical data. No therapeutic action should be taken solely based upon these results. References:                    Comment                   ACCAZ   1.  ACannon Kettle, et al. (2002) VH mutation status, CD38 expression    level, genomic aberrations, and survival in chronic lymphocytic    leukemia. Blood 100(4):1410-1416. 2.  F. Fais, et al. (1998) Chronic Lymphocytic Leukemia B Cells    Express Restricted Sets of Mutated and Unmutated Antigen    Receptors. J Clin Invest 102 (8):1515-1525. 3.  T.J. Hamblin, et al. (1999) Unmutated IgVH genes are associated    with a more aggressive form of chronic lymphocytic leukemia. Blood    94(6):1848-1854. 4.  F. Matsuda, et al. (1998) The complete nucleotide sequence of the    human immunoglobulin heavy chain variable region locus. J. Exp  Med    151(76):1607-3710. Disclaimer:                    Comment                   ACCAZ   Results of this test are for investigational purposes only. The performance characteristics of this assay have been determined by [facility]. The result should not be used as a diagnostic procedure without confirmation of the diagnosis by another medically established diagnostic product or procedure. Performed At: [facility] [place], [place] [id number] Rush Farmer MD EV:[id number] Performed At: [facility] [address] [place], [place] [id number] Paler Gates Rigg MD VE:[id number]   Miscellaneous [facility] test (send-out)     Status: None   Collection Time: 08/18/18  2:44 PM  Result Value Ref Range   [facility] test code [id number]     Comment: Performed at [facility], [address] [place], [place] [address]   [facility] test name CLL FISH PROFILE     Comment: Performed at [facility], [address] [place], [place] [address] CORRECTED ON 12/12 AT 4431: PREVIOUSLY REPORTED AS [id number]    Misc [facility] result COMMENT     Comment: (NOTE) Test Ordered: [id number] CLL FISH Panel Specimen Type                  Comment:                  YU   BLOOD Cells Counted                  Comment:                  YU   100/PROBE Cells Analyzed                 Comment:  YU   100/PROBE FISH Result                    Comment:                  YU   62% OF NUCLEI POSITIVE FOR TP53 GENE DELETION Interpretation                 Comment:                  YU   CLL RELATED CLONE DETECTED    The CLL interphase fluorescence in situ hybridization (FISH) panel analysis was positive for loss of one TP53 signal. Results for CCND1/IGH, ATM, chromosome 12, and 13q were normal.      Deletion of the TP53 gene on chromosome 17p13.1, although not specific for CLL, is associated with the most adverse prognosis in patients diagnosed with CLL.    SPECIFIC FISH RESULTS:.  TP53:      ABNORMAL  .      nuc ish 17p13.1(TP53x1)[62/100]  .  CCND1/IGH: NORMAL   .      nuc ish 11q13(CCND1x2),14q32(IGHx2)[100] .  12cen:     NO RMAL    .      nuc ish 12cen(D12Z3x2)[100] .  13q:       NORMAL    .      nuc ish 13q14.3(DLEUx2),13q34(TFDP1x2)[100] .  ATM:       NORMAL  .      nuc ish 11q22.3(ATMx2)[100]        This analysis is limited to abnormalities detectable by the specific probes included in the study. FISH results should be interpreted within the context of a full cytogenetic analysis and hematologic evaluation.  REFERENCES:.  Malek,(2013) Adv Exp Med Biol [id number] .  Schnaiter et al.,(2011) Clin Lab Med [phone].FYTW#[id number]       .       This test was developed and its performance characteristics determined by [facility]). It has not been cleared or approved by the U.S. Food and Drug Administration. The DNA probe vendor for this study was [employer], research (physical sciences)). Director Review:               Comment:                  Vicente Serene, PhD, [facility] Performed At: [facility] [address] [place], [place] Rush Farmer MD TR:[id number] Performed At: [facility], [place] [id number] Nechama Guard MD TY:[id number]   Phosphorus     Status: None   Collection Time: 08/18/18  2:44 PM  Result Value Ref Range   Phosphorus 4.0 2.5 - 4.6 mg/dL    Comment: Performed at [facility], [address] [place], [place] [address]  Magnesium     Status: None   Collection Time: 08/18/18  2:44 PM  Result Value Ref Range   Magnesium 2.1 1.7 - 2.4 mg/dL    Comment: Performed at [facility], [address] [place], [place] [address]  Comprehensive metabolic panel     Status: Abnormal   Collection Time: 08/18/18  2:44 PM  Result Value Ref Range   Sodium 141 135 - 145 mmol/L   Potassium 4.3 3.5 - 5.1 mmol/L   Chloride 106 98 - 111 mmol/L   CO2 28 22 - 32 mmol/L   Glucose, Bld 87 70 -  99 mg/dL   BUN 28 (H) 6 - 20 mg/dL   Creatinine, Ser 0.87 0.44 - 1.00 mg/dL   Calcium 8.8 (L) 8.9 - 10.3 mg/dL   Total Protein 6.4 (L) 6.5 - 8.1 g/dL   Albumin 4.1 3.5 - 5.0 g/dL   AST 19 15 - 41 U/L   ALT 15 0 - 44 U/L   Alkaline Phosphatase 47 38 - 126 U/L   Total Bilirubin 0.8 0.3 - 1.2 mg/dL   GFR calc non Af Amer >60 >60 mL/min   GFR calc Af Amer >60 >60 mL/min   Anion gap 7 5 - 15    Comment: Performed at [facility], [address] [place], [place] [address]  CBC with Differential/Platelet     Status: Abnormal   Collection Time: 08/18/18  2:44 PM  Result Value Ref Range   WBC 19.9 (H) 4.0 - 10.5 K/uL   RBC 3.76 (L) 3.87 - 5.11 MIL/uL   Hemoglobin 12.3 12.0 - 15.0 g/dL   HCT 37.2 36.0 - 46.0 %   MCV 98.9 80.0 -  100.0 fL   MCH 32.7 26.0 - 34.0 pg   MCHC 33.1 30.0 - 36.0 g/dL   RDW 12.7 11.5 - 15.5 %   Platelets 154 150 - 400 K/uL   nRBC 0.0 0.0 - 0.2 %   Neutrophils Relative % 8 %   Neutro Abs 1.7 1.7 - 7.7 K/uL   Lymphocytes Relative 88 %   Lymphs Abs 17.5 (H) 0.7 - 4.0 K/uL   Monocytes Relative 3 %   Monocytes Absolute 0.5 0.1 - 1.0 K/uL   Eosinophils Relative 0 %   Eosinophils Absolute 0.0 0.0 - 0.5 K/uL   Basophils Relative 1 %   Basophils Absolute 0.1 0.0 - 0.1 K/uL   WBC Morphology ABSOLUTE LYMPHOCYTOSIS     Comment: SMUDGE CELLS   Smear Review SMEAR SCANNED    Immature Granulocytes 0 %   Abs Immature Granulocytes 0.05 0.00 - 0.07 K/uL    Comment: Performed at [facility], [place], [place] [address]  Sed Rate (ESR)     Status: None   Collection Time: 08/21/18  9:49 AM  Result Value Ref Range   Sed Rate 2 0 - 20 mm/h  C-reactive protein     Status: None   Collection Time: 08/21/18  9:49 AM  Result Value Ref Range   CRP 0.7 <8.0 mg/L  LUPUS(12) PANEL     Status: None   Collection Time: 08/21/18  9:49 AM  Result Value Ref Range   Anti Nuclear Antibody(ANA) NEGATIVE NEGATIVE    Comment: ANA IFA is a first line screen for detecting the presence of up to approximately 150 autoantibodies in various autoimmune diseases. A negative ANA IFA result suggests an ANA-associated autoimmune disease is not present at this time, but is not definitive. If there is high clinical suspicion for Sjogren's syndrome, testing for anti-SS-A/Ro antibody should be considered. Anti-Jo-1 antibody should be considered for clinically suspected inflammatory myopathies. . AC-0: Negative . International Consensus on ANA Patterns (https://www.hernandez-brewer.com/) . For additional information, please refer to http://education.QuestDiagnostics.com/faq/FAQ177 (This link is being provided for informational/ educational purposes only.) .    C3 Complement 94 83 - 193 mg/dL    C4 Complement 20 15 - 57 mg/dL   ds DNA Ab 1 IU/mL    Comment:  IU/mL       Interpretation                            < or = 4    Negative                            5-9         Indeterminate                            > or = 10   Positive .    Ribosomal P Protein Ab <1.0 NEG <1.0 NEG AI   ENA SM Ab Ser-aCnc <1.0 NEG <1.0 NEG AI   SM/RNP <1.0 NEG <1.0 NEG AI   SSA (Ro) (ENA) Antibody, IgG <1.0 NEG <1.0 NEG AI   SSB (La) (ENA) Antibody, IgG <1.0 NEG <1.0 NEG AI   Thyroperoxidase Ab SerPl-aCnc 1 <9 IU/mL   Scleroderma (Scl-70) (ENA) Antibody, IgG <1.0 NEG <1.0 NEG AI   Rhuematoid fact SerPl-aCnc <14 <14 IU/mL  B Nat Peptide     Status: None   Collection Time: 09/10/18 11:24 AM  Result Value Ref Range   Brain Natriuretic Peptide 28 <100 pg/mL    Comment: . BNP levels increase with age in the general population with the highest values seen in individuals greater than 66 years of age. Reference: J. Am. [place] Ar. Cardiol. 2002; 50:277-412. [place] Kitchen   Comprehensive metabolic panel     Status: Abnormal   Collection Time: 09/24/18 11:16 AM  Result Value Ref Range   Sodium 138 135 - 145 mmol/L   Potassium 3.7 3.5 - 5.1 mmol/L   Chloride 103 98 - 111 mmol/L   CO2 26 22 - 32 mmol/L   Glucose, Bld 115 (H) 70 - 99 mg/dL   BUN 23 (H) 6 - 20 mg/dL   Creatinine, Ser 0.82 0.44 - 1.00 mg/dL   Calcium 8.9 8.9 - 10.3 mg/dL   Total Protein 6.9 6.5 - 8.1 g/dL   Albumin 4.1 3.5 - 5.0 g/dL   AST 19 15 - 41 U/L   ALT 11 0 - 44 U/L   Alkaline Phosphatase 51 38 - 126 U/L   Total Bilirubin 0.7 0.3 - 1.2 mg/dL   GFR calc non Af Amer >60 >60 mL/min   GFR calc Af Amer >60 >60 mL/min   Anion gap 9 5 - 15    Comment: Performed at [facility], [address] [place], [place] [address]  CBC with Differential     Status: Abnormal   Collection Time: 09/24/18 11:16 AM  Result Value Ref Range   WBC 27.8 (H) 4.0 - 10.5 K/uL   RBC 4.23 3.87 - 5.11 MIL/uL   Hemoglobin 13.2 12.0 -  15.0 g/dL   HCT 40.9 36.0 - 46.0 %   MCV 96.7 80.0 - 100.0 fL   MCH 31.2 26.0 - 34.0 pg   MCHC 32.3 30.0 - 36.0 g/dL   RDW 12.0 11.5 - 15.5 %   Platelets 222 150 - 400 K/uL   nRBC 0.0 0.0 - 0.2 %   Neutrophils Relative % 10 %   Neutro Abs 2.9 1.7 - 7.7 K/uL   Lymphocytes Relative 86 %   Lymphs Abs 23.8 (H) 0.7 - 4.0 K/uL   Monocytes Relative 3 %   Monocytes Absolute 0.8 0.1 - 1.0 K/uL  Eosinophils Relative 0 %   Eosinophils Absolute 0.1 0.0 - 0.5 K/uL   Basophils Relative 0 %   Basophils Absolute 0.1 0.0 - 0.1 K/uL   WBC Morphology ABSOLUTE LYMPHOCYTOSIS     Comment: SMUDGE CELLS   Smear Review CONSISTANT WITH KNOWN CLL    Immature Granulocytes 1 %   Abs Immature Granulocytes 0.33 (H) 0.00 - 0.07 K/uL    Comment: Performed at [facility], [address] [place], [place] [address]  Phosphorus     Status: None   Collection Time: 09/24/18 11:16 AM  Result Value Ref Range   Phosphorus 3.4 2.5 - 4.6 mg/dL    Comment: Performed at [facility], [address] [place], [place] [address]  Magnesium     Status: None   Collection Time: 09/24/18 11:16 AM  Result Value Ref Range   Magnesium 1.8 1.7 - 2.4 mg/dL    Comment: Performed at [facility], [address] [place], [place] [address]  CBC with Differential     Status: Abnormal   Collection Time: 10/03/18 10:12 AM  Result Value Ref Range   WBC 18.1 (H) 4.0 - 10.5 K/uL   RBC 4.26 3.87 - 5.11 MIL/uL   Hemoglobin 13.3 12.0 - 15.0 g/dL   HCT 43.0 36.0 - 46.0 %   MCV 100.9 (H) 80.0 - 100.0 fL   MCH 31.2 26.0 - 34.0 pg   MCHC 30.9 30.0 - 36.0 g/dL   RDW 12.6 11.5 - 15.5 %   Platelets 148 (L) 150 - 400 K/uL   Neutrophils Relative % 35 %   Neutro Abs 6.3 1.7 - 7.7 K/uL   Lymphocytes Relative 57 %   Lymphs Abs 10.3 (H) 0.7 - 4.0 K/uL   Monocytes Relative 4 %   Monocytes Absolute 0.7 0.1 - 1.0 K/uL   Eosinophils Relative 1 %   Eosinophils Absolute 0.1 0.0 - 0.5 K/uL   Basophils Relative 1 %    Basophils Absolute 0.1 0.0 - 0.1 K/uL   WBC Morphology ABSOLUTE LYMPHOCYTOSIS    Smear Review SMUDGE CELLS    nRBC 0 0 /100 WBC   Immature Granulocytes 2 %   Abs Immature Granulocytes 0.41 (H) 0.00 - 0.07 K/uL    Comment: Performed at [facility], [address] [place], [place] [address]  Comprehensive metabolic panel     Status: Abnormal   Collection Time: 10/03/18 10:12 AM  Result Value Ref Range   Sodium 138 135 - 145 mmol/L   Potassium 3.9 3.5 - 5.1 mmol/L   Chloride 107 98 - 111 mmol/L   CO2 25 22 - 32 mmol/L   Glucose, Bld 83 70 - 99 mg/dL   BUN 15 6 - 20 mg/dL   Creatinine, Ser 0.71 0.44 - 1.00 mg/dL   Calcium 8.3 (L) 8.9 - 10.3 mg/dL   Total Protein 6.7 6.5 - 8.1 g/dL   Albumin 3.9 3.5 - 5.0 g/dL   AST 19 15 - 41 U/L   ALT 12 0 - 44 U/L   Alkaline Phosphatase 58 38 - 126 U/L   Total Bilirubin 0.8 0.3 - 1.2 mg/dL   GFR calc non Af Amer >60 >60 mL/min   GFR calc Af Amer >60 >60 mL/min   Anion gap 6 5 - 15    Comment: Performed at [facility], [address] [place], [place] [address]  Troponin I - Add-On to previous collection     Status: None   Collection  Time: 10/03/18 10:12 AM  Result Value Ref Range   Troponin I <0.03 <0.03 ng/mL    Comment: Performed at [facility], [address] [place], [place] [address]  Comprehensive metabolic panel     Status: Abnormal   Collection Time: 10/22/18  9:31 AM  Result Value Ref Range   Sodium 137 135 - 145 mmol/L   Potassium 4.1 3.5 - 5.1 mmol/L   Chloride 106 98 - 111 mmol/L   CO2 26 22 - 32 mmol/L   Glucose, Bld 73 70 - 99 mg/dL   BUN 21 (H) 6 - 20 mg/dL   Creatinine, Ser 0.73 0.44 - 1.00 mg/dL   Calcium 8.3 (L) 8.9 - 10.3 mg/dL   Total Protein 6.3 (L) 6.5 - 8.1 g/dL   Albumin 3.7 3.5 - 5.0 g/dL   AST 16 15 - 41 U/L   ALT 13 0 - 44 U/L   Alkaline Phosphatase 48 38 - 126 U/L   Total Bilirubin 0.6 0.3 - 1.2 mg/dL   GFR calc non Af Amer >60 >60 mL/min   GFR calc Af Amer >60 >60 mL/min    Anion gap 5 5 - 15    Comment: Performed at [facility], [address], [place], [place] [address]  CBC with Differential     Status: Abnormal   Collection Time: 10/22/18  9:31 AM  Result Value Ref Range   WBC 32.7 (H) 4.0 - 10.5 K/uL   RBC 4.24 3.87 - 5.11 MIL/uL   Hemoglobin 13.1 12.0 - 15.0 g/dL   HCT 41.4 36.0 - 46.0 %   MCV 97.6 80.0 - 100.0 fL   MCH 30.9 26.0 - 34.0 pg   MCHC 31.6 30.0 - 36.0 g/dL   RDW 13.2 11.5 - 15.5 %   Platelets 233 150 - 400 K/uL   nRBC 0.0 0.0 - 0.2 %   Neutrophils Relative % 18 %   Neutro Abs 6.0 1.7 - 7.7 K/uL   Lymphocytes Relative 79 %   Lymphs Abs 25.6 (H) 0.7 - 4.0 K/uL   Monocytes Relative 2 %   Monocytes Absolute 0.7 0.1 - 1.0 K/uL   Eosinophils Relative 1 %   Eosinophils Absolute 0.2 0.0 - 0.5 K/uL   Basophils Relative 0 %   Basophils Absolute 0.1 0.0 - 0.1 K/uL   WBC Morphology ABSOLUTE LYMPHOCYTOSIS    Smear Review DIFF CONFIRMED BY MANUAL    Immature Granulocytes 0 %   Abs Immature Granulocytes 0.11 (H) 0.00 - 0.07 K/uL    Comment: Performed at [facility], [address] [place], [place] [address]  Phosphorus     Status: None   Collection Time: 10/22/18  9:31 AM  Result Value Ref Range   Phosphorus 3.3 2.5 - 4.6 mg/dL    Comment: Performed at [facility], [address] [place], [place] [address]  Magnesium     Status: None   Collection Time: 10/22/18  9:31 AM  Result Value Ref Range   Magnesium 2.0 1.7 - 2.4 mg/dL    Comment: Performed at [facility], [address] [place], [place] [address]  POCT Urinalysis Dipstick     Status: Abnormal   Collection Time: 10/27/18  9:58 AM  Result Value Ref Range   Color, UA dark yellow    Clarity, UA cloudy    Glucose, UA Negative Negative   Bilirubin, UA neg    Ketones, UA neg    Spec Grav, UA 1.020 1.010 -  1.025   Blood, UA large    pH, UA 5.0 5.0 - 8.0   Protein, UA Positive (A) Negative    Comment: 30    Urobilinogen, UA 0.2 0.2 or 1.0  E.U./dL   Nitrite, UA neg    Leukocytes, UA Large (3+) (A) Negative   Appearance     Odor       PHQ2/9: Depression screen [facility] 2/9 10/27/2018 10/20/2018 09/24/2018 08/21/2018 07/01/2018  Decreased Interest 0 0 0 0 0  Down, Depressed, Hopeless 0 1 0 0 0  PHQ - 2 Score 0 1 0 0 0  Altered sleeping - 0 - 0 -  Tired, decreased energy - 1 - 0 -  Change in appetite - 1 - 0 -  Feeling bad or failure about yourself  - 0 - 0 -  Trouble concentrating - 1 - 0 -  Moving slowly or fidgety/restless - 0 - 0 -  Suicidal thoughts - 0 - 0 -  PHQ-9 Score - 4 - 0 -  Difficult doing work/chores - Not difficult at all - Not difficult at all -     Fall Risk: Fall Risk  10/20/2018 09/24/2018 09/10/2018 08/21/2018 07/01/2018  Falls in the past year? 0 0 0 0 No  Number falls in past yr: 0 - - 0 -  Injury with Fall? 0 - - 0 -     Assessment & Plan  1. Urinary tract infection symptoms  - POCT Urinalysis Dipstick - CULTURE, URINE COMPREHENSIVE - GC probe amplification, urine - ciprofloxacin (CIPRO) 250 MG tablet; Take 1 tablet (250 mg total) by mouth 2 (two) times daily.  Dispense: 6 tablet; Refill: 0 CT abdomen showed bladder wall thickening.   2. Routine screening for STI (sexually transmitted infection)  - GC probe amplification, urine  3. Other emphysema ([place])  She is trying to quit smoking  4. Thoracic aortic atherosclerosis ([place])  Not on statin therapy  5. Trigeminal neuralgia  Doing better, does not want to take two pills bid, we will change to 100 mg twice daily   6. CLL (chronic lymphocytic leukemia) ([place])  Keep follow up with oncologist

## 2018-10-29 ENCOUNTER — Other Ambulatory Visit: Payer: Self-pay

## 2018-10-29 ENCOUNTER — Inpatient Hospital Stay (HOSPITAL_BASED_OUTPATIENT_CLINIC_OR_DEPARTMENT_OTHER): Payer: BLUE CROSS/BLUE SHIELD | Admitting: Oncology

## 2018-10-29 VITALS — BP 113/67 | HR 72 | Temp 97.6°F | Ht 67.0 in | Wt 159.0 lb

## 2018-10-29 DIAGNOSIS — Z720 Tobacco use: Secondary | ICD-10-CM

## 2018-10-29 DIAGNOSIS — D72829 Elevated white blood cell count, unspecified: Secondary | ICD-10-CM

## 2018-10-29 DIAGNOSIS — G500 Trigeminal neuralgia: Secondary | ICD-10-CM | POA: Diagnosis not present

## 2018-10-29 DIAGNOSIS — C9110 Chronic lymphocytic leukemia of B-cell type not having achieved remission: Secondary | ICD-10-CM | POA: Diagnosis not present

## 2018-10-29 DIAGNOSIS — N390 Urinary tract infection, site not specified: Secondary | ICD-10-CM | POA: Diagnosis not present

## 2018-10-29 LAB — CULTURE, URINE COMPREHENSIVE
MICRO NUMBER:: 179607
SPECIMEN QUALITY:: ADEQUATE

## 2018-10-29 NOTE — Progress Notes (Signed)
Patient is here today to follow up on her CLL and to go over her CT Scan and lab results.

## 2018-11-02 ENCOUNTER — Ambulatory Visit (INDEPENDENT_AMBULATORY_CARE_PROVIDER_SITE_OTHER): Payer: BLUE CROSS/BLUE SHIELD | Admitting: Family Medicine

## 2018-11-02 ENCOUNTER — Encounter: Payer: Self-pay | Admitting: Family Medicine

## 2018-11-02 DIAGNOSIS — F430 Acute stress reaction: Secondary | ICD-10-CM | POA: Diagnosis not present

## 2018-11-02 DIAGNOSIS — M25562 Pain in left knee: Secondary | ICD-10-CM | POA: Diagnosis not present

## 2018-11-02 DIAGNOSIS — G4489 Other headache syndrome: Secondary | ICD-10-CM | POA: Diagnosis not present

## 2018-11-02 MED ORDER — HYDROXYZINE HCL 10 MG PO TABS: 10.0000 mg | ORAL_TABLET | Freq: Three times a day (TID) | ORAL | 0 refills | Status: DC | PRN
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
## 2018-11-13 MED FILL — IMBRUVICA 420 MG TAB: 420 | 28 days supply | Qty: 28 | Fill #1

## 2018-11-16 ENCOUNTER — Other Ambulatory Visit: Payer: Self-pay | Admitting: Family Medicine

## 2018-11-16 DIAGNOSIS — F430 Acute stress reaction: Secondary | ICD-10-CM

## 2018-11-18 DIAGNOSIS — R51 Headache: Secondary | ICD-10-CM | POA: Diagnosis not present

## 2018-11-18 DIAGNOSIS — R202 Paresthesia of skin: Secondary | ICD-10-CM | POA: Diagnosis not present

## 2018-11-22 ENCOUNTER — Other Ambulatory Visit: Payer: Self-pay | Admitting: Pain Medicine

## 2018-11-22 DIAGNOSIS — E559 Vitamin D deficiency, unspecified: Secondary | ICD-10-CM

## 2018-11-23 ENCOUNTER — Other Ambulatory Visit: Payer: Self-pay

## 2018-11-23 DIAGNOSIS — F430 Acute stress reaction: Secondary | ICD-10-CM

## 2018-11-23 MED ORDER — HYDROXYZINE HCL 10 MG PO TABS: 10.0000 mg | ORAL_TABLET | Freq: Three times a day (TID) | ORAL | 3 refills | Status: DC | PRN

## 2018-11-23 NOTE — Telephone Encounter (Signed)
Refill request for general medication: Hydroxyzine HCL 10 mg  Last office visit: 11/02/2018  Last physical exam: 11/06/2015  Follow-ups on file. 04/28/2019

## 2018-12-09 ENCOUNTER — Other Ambulatory Visit: Payer: Self-pay

## 2018-12-10 ENCOUNTER — Inpatient Hospital Stay: Payer: BLUE CROSS/BLUE SHIELD | Attending: Oncology

## 2018-12-10 ENCOUNTER — Other Ambulatory Visit: Payer: Self-pay

## 2018-12-10 DIAGNOSIS — C9110 Chronic lymphocytic leukemia of B-cell type not having achieved remission: Secondary | ICD-10-CM | POA: Diagnosis not present

## 2018-12-10 LAB — CBC WITH DIFFERENTIAL/PLATELET
Abs Immature Granulocytes: 0.04 10*3/uL (ref 0.00–0.07)
Basophils Absolute: 0.1 10*3/uL (ref 0.0–0.1)
Basophils Relative: 1 %
Eosinophils Absolute: 0.1 10*3/uL (ref 0.0–0.5)
Eosinophils Relative: 0 %
HCT: 40.6 % (ref 36.0–46.0)
Hemoglobin: 13.6 g/dL (ref 12.0–15.0)
Immature Granulocytes: 0 %
Lymphocytes Relative: 72 %
Lymphs Abs: 11.7 10*3/uL — ABNORMAL HIGH (ref 0.7–4.0)
MCH: 31.9 pg (ref 26.0–34.0)
MCHC: 33.5 g/dL (ref 30.0–36.0)
MCV: 95.3 fL (ref 80.0–100.0)
Monocytes Absolute: 0.7 10*3/uL (ref 0.1–1.0)
Monocytes Relative: 4 %
NRBC: 0 % (ref 0.0–0.2)
Neutro Abs: 3.7 10*3/uL (ref 1.7–7.7)
Neutrophils Relative %: 23 %
Platelets: 205 10*3/uL (ref 150–400)
RBC: 4.26 MIL/uL (ref 3.87–5.11)
RDW: 13.8 % (ref 11.5–15.5)
WBC: 16.2 10*3/uL — ABNORMAL HIGH (ref 4.0–10.5)

## 2018-12-10 LAB — COMPREHENSIVE METABOLIC PANEL
ALT: 14 U/L (ref 0–44)
ANION GAP: 8 (ref 5–15)
AST: 16 U/L (ref 15–41)
Albumin: 4.2 g/dL (ref 3.5–5.0)
Alkaline Phosphatase: 49 U/L (ref 38–126)
BUN: 24 mg/dL — ABNORMAL HIGH (ref 6–20)
CO2: 28 mmol/L (ref 22–32)
Calcium: 9.3 mg/dL (ref 8.9–10.3)
Chloride: 103 mmol/L (ref 98–111)
Creatinine, Ser: 0.81 mg/dL (ref 0.44–1.00)
GFR calc Af Amer: >60 mL/min (ref >60)
GFR calc non Af Amer: >60 mL/min (ref >60)
Glucose, Bld: 88 mg/dL (ref 70–99)
Potassium: 4.5 mmol/L (ref 3.5–5.1)
Sodium: 139 mmol/L (ref 135–145)
Total Bilirubin: 0.5 mg/dL (ref 0.3–1.2)
Total Protein: 7 g/dL (ref 6.5–8.1)

## 2018-12-10 LAB — MAGNESIUM: Magnesium: 2.2 mg/dL (ref 1.7–2.4)

## 2018-12-10 LAB — PHOSPHORUS: Phosphorus: 3.9 mg/dL (ref 2.5–4.6)

## 2018-12-14 ENCOUNTER — Other Ambulatory Visit: Payer: Self-pay | Admitting: Family Medicine

## 2018-12-14 DIAGNOSIS — F430 Acute stress reaction: Secondary | ICD-10-CM

## 2018-12-14 NOTE — Telephone Encounter (Signed)
Refill request for general medication. Hydroxyzine to [facility]  Last office visit 11/02/2018   Follow up on 04/28/2019

## 2018-12-16 ENCOUNTER — Other Ambulatory Visit: Payer: Self-pay | Admitting: Pain Medicine

## 2018-12-16 DIAGNOSIS — M5441 Lumbago with sciatica, right side: Principal | ICD-10-CM

## 2018-12-16 DIAGNOSIS — M5442 Lumbago with sciatica, left side: Principal | ICD-10-CM

## 2018-12-16 DIAGNOSIS — M47816 Spondylosis without myelopathy or radiculopathy, lumbar region: Secondary | ICD-10-CM

## 2018-12-16 DIAGNOSIS — G8929 Other chronic pain: Secondary | ICD-10-CM

## 2018-12-17 MED FILL — IMBRUVICA 420 MG TAB: 420 | 28 days supply | Qty: 28 | Fill #2

## 2018-12-23 ENCOUNTER — Ambulatory Visit: Payer: BLUE CROSS/BLUE SHIELD | Admitting: Family Medicine

## 2018-12-30 ENCOUNTER — Other Ambulatory Visit: Payer: Self-pay

## 2018-12-30 ENCOUNTER — Ambulatory Visit: Payer: BLUE CROSS/BLUE SHIELD | Attending: Nurse Practitioner | Admitting: Nurse Practitioner

## 2018-12-30 DIAGNOSIS — M5442 Lumbago with sciatica, left side: Secondary | ICD-10-CM | POA: Diagnosis not present

## 2018-12-30 DIAGNOSIS — M47816 Spondylosis without myelopathy or radiculopathy, lumbar region: Secondary | ICD-10-CM | POA: Diagnosis not present

## 2018-12-30 DIAGNOSIS — M5441 Lumbago with sciatica, right side: Secondary | ICD-10-CM

## 2018-12-30 DIAGNOSIS — M51369 Other intervertebral disc degeneration, lumbar region without mention of lumbar back pain or lower extremity pain: Secondary | ICD-10-CM

## 2018-12-30 DIAGNOSIS — G8929 Other chronic pain: Secondary | ICD-10-CM

## 2018-12-30 DIAGNOSIS — M5416 Radiculopathy, lumbar region: Secondary | ICD-10-CM

## 2018-12-30 DIAGNOSIS — M5136 Other intervertebral disc degeneration, lumbar region: Secondary | ICD-10-CM

## 2018-12-30 DIAGNOSIS — E559 Vitamin D deficiency, unspecified: Secondary | ICD-10-CM

## 2018-12-30 MED ORDER — MELOXICAM 15 MG PO TABS: 15.0000 mg | ORAL_TABLET | Freq: Every day | ORAL | 0 refills | Status: DC

## 2018-12-30 MED ORDER — MAGNESIUM 500 MG PO CAPS: 500.0000 mg | ORAL_CAPSULE | Freq: Two times a day (BID) | ORAL | 5 refills | Status: DC

## 2018-12-30 MED ORDER — GNP CALCIUM 1200 1200-1000 MG-UNIT PO CHEW: 1200.0000 mg | CHEWABLE_TABLET | Freq: Every day | ORAL | 5 refills | Status: DC

## 2018-12-30 MED ORDER — TIZANIDINE HCL 2 MG PO TABS: 2.0000 mg | ORAL_TABLET | Freq: Three times a day (TID) | ORAL | 5 refills | Status: DC | PRN
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
## 2018-12-31 ENCOUNTER — Ambulatory Visit: Payer: BLUE CROSS/BLUE SHIELD | Admitting: Nurse Practitioner

## 2019-01-07 ENCOUNTER — Other Ambulatory Visit: Payer: Self-pay | Admitting: Oncology

## 2019-01-07 DIAGNOSIS — C9110 Chronic lymphocytic leukemia of B-cell type not having achieved remission: Secondary | ICD-10-CM

## 2019-01-12 MED FILL — IMBRUVICA 420 MG TAB: 420 | 28 days supply | Qty: 28 | Fill #0

## 2019-01-14 ENCOUNTER — Encounter: Payer: Self-pay | Admitting: Family Medicine

## 2019-01-14 ENCOUNTER — Other Ambulatory Visit: Payer: Self-pay

## 2019-01-14 ENCOUNTER — Ambulatory Visit (INDEPENDENT_AMBULATORY_CARE_PROVIDER_SITE_OTHER): Payer: BLUE CROSS/BLUE SHIELD | Admitting: Family Medicine

## 2019-01-14 VITALS — BP 114/70 | HR 71 | Temp 97.5°F | Resp 16 | Ht 67.0 in | Wt 170.0 lb

## 2019-01-14 DIAGNOSIS — L989 Disorder of the skin and subcutaneous tissue, unspecified: Secondary | ICD-10-CM

## 2019-01-14 DIAGNOSIS — N3090 Cystitis, unspecified without hematuria: Secondary | ICD-10-CM | POA: Diagnosis not present

## 2019-01-14 DIAGNOSIS — R140 Abdominal distension (gaseous): Secondary | ICD-10-CM | POA: Diagnosis not present

## 2019-01-14 DIAGNOSIS — R198 Other specified symptoms and signs involving the digestive system and abdomen: Secondary | ICD-10-CM | POA: Diagnosis not present

## 2019-01-14 MED ORDER — POLYETHYLENE GLYCOL 3350 17 GM/SCOOP PO POWD: 17.0000 g | Freq: Two times a day (BID) | ORAL | 0 refills | Status: DC | PRN

## 2019-01-14 NOTE — Progress Notes (Signed)
Name: Monique [name]   MRN: [id number]    DOB: [birth year]   Date:01/14/2019       Progress Note  Subjective  Chief Complaint  Chief Complaint  Patient presents with  . Skin Problem    growth on nose, not a pimple, has changed in size, no pain, x 1 month  . Bloated    Abdominal pressure that comes and goes, bloating. Feels like baby. Pain in lower back.    HPI  PTSD: seeing Dr. Gwen Her and also a therapist, taking new medications . MRI ordered by not done we will find out why   History of head trauma at work: mri not done yet, we will make sure it is scheduled   Abdominal distension: intermittent, also has noticed change in bowel movements, used to be daily and now bowel movements every few days, Sherian Maroon is from 1 to 4 . No blood in stools, no change in diet. She has a history of chronic lymphocytic leukemia, recent CT had improved, but advised referral to GI  Skin lesion nose: going on for the past month, unable to squeeze any material, refer to dermatologist   Recent cystitis: we will recheck urine culture, no dysuria or urgency at this time, but previous episode found on CT scan abdomen that showed bladder wall thickening.   Patient Active Problem List   Diagnosis Date Noted  . Trigeminal neuralgia 10/05/2018  . Lumbar radiculitis (Right) 09/24/2018  . Hypocalcemia 06/03/2018  . Vitamin D insufficiency 06/03/2018  . Abnormal MRI, lumbar spine (04/20/2017) 06/03/2018  . Chronic hip pain (Right) 06/03/2018  . Spondylosis without myelopathy or radiculopathy, lumbar region 06/03/2018  . DDD (degenerative disc disease), lumbar 06/02/2018  . Lumbar facet hypertrophy 06/02/2018  . Lumbar facet arthropathy 06/02/2018  . Lumbar facet syndrome (Bilateral) (R>L) 06/02/2018  . Marijuana use 05/19/2018  . Chronic lower extremity pain (Primary Area of Pain) (Bilateral) (R>L) 05/13/2018  . Chronic low back pain (Secondary Area of Pain) (Bilateral) (R>L) w/ sciatica (Bilateral)  05/13/2018  . Chronic pain syndrome 05/13/2018  . Opiate use 05/13/2018  . Disorder of skeletal system 05/13/2018  . Pharmacologic therapy 05/13/2018  . Problems influencing health status 05/13/2018  . Mastalgia 05/05/2018  . Breast mass, right 01/07/2018  . Facial pain 12/11/2017  . Tingling 12/11/2017  . Thoracic aortic atherosclerosis ([facility]) 08/20/2017  . Emphysema of lung ([place]) 08/20/2017  . Adductor tendinitis 04/23/2017  . Trochanteric bursitis of right hip 04/23/2017  . GERD without esophagitis 12/11/2016  . CLL (chronic lymphocytic leukemia) ([place]) 11/24/2016  . Pap smear abnormality of cervix/human papillomavirus (HPV) positive 09/12/2016  . Stress incontinence 09/04/2016  . B12 deficiency 07/10/2015  . Insomnia, persistent 07/10/2015  . Anorexia nervosa, restricting type 07/10/2015  . Anxiety, generalized 07/10/2015  . H/O suicide attempt 07/10/2015  . Lymphocytosis 07/10/2015  . Obsessive-compulsive disorder 07/10/2015  . Tobacco use 07/10/2015  . History of cervical dysplasia 07/18/2014    Past Surgical History:  Procedure Laterality Date  . BREAST BIOPSY Right 01/05/2018   US guided biopsy of 2 areas and 1 lymph node, MIXED INFLAMMATION AND GIANT CELL REACTION  . CERVICAL BIOPSY  W/ LOOP ELECTRODE EXCISION    . OTHER SURGICAL HISTORY     scar tissue removed from vocal cords  . TUBAL LIGATION      Family History  Problem Relation Age of Onset  . Cancer Mother        thyroid  . Alcohol abuse Father   . Alcohol abuse  Brother   . Depression Brother   . Bipolar disorder Brother   . ADD / ADHD Son   . Breast cancer Neg Hx     Social History   Socioeconomic History  . Marital status: Divorced    Spouse name: Not on file  . Number of children: 3  . Years of education: Not on file  . Highest education level: Not on file  Occupational History  . Occupation: Best boy: [employer]  . Financial resource strain: Somewhat hard  . Food  insecurity:    Worry: Never true    Inability: Never true  . Transportation needs:    Medical: No    Non-medical: No  Tobacco Use  . Smoking status: Current Every Day Smoker    Packs/day: 0.50    Years: 31.00    Pack years: 15.50    Types: Cigarettes    Start date: 09/25/1986  . Smokeless tobacco: Never Used  . Tobacco comment: taking chantix again and is cutting down on amount   Substance and Sexual Activity  . Alcohol use: Yes    Alcohol/week: 0.0 standard drinks    Comment: rarely  . Drug use: Yes    Types: Marijuana  . Sexual activity: Yes    Partners: Male    Birth control/protection: None  Lifestyle  . Physical activity:    Days per week: 5 days    Minutes per session: 30 min  . Stress: Not at all  Relationships  . Social connections:    Talks on phone: More than three times a week    Gets together: Twice a week    Attends religious service: 1 to 4 times per year    Active member of club or organization: Yes    Attends meetings of clubs or organizations: 1 to 4 times per year    Relationship status: Divorced  . Intimate partner violence:    Fear of current or ex partner: No    Emotionally abused: No    Physically abused: No    Forced sexual activity: No  Other Topics Concern  . Not on file  Social History Narrative  . Not on file     Current Outpatient Medications:  .  Calcium Carbonate-Vit D-Min (GNP CALCIUM 1200) 1200-1000 MG-UNIT CHEW, Chew 1,200 mg by mouth daily with breakfast. Take in combination with vitamin D and magnesium., Disp: 30 tablet, Rfl: 5 .  CHANTIX CONTINUING MONTH PAK 1 MG tablet, , Disp: , Rfl:  .  Fluticasone-Umeclidin-Vilant (TRELEGY ELLIPTA) 100-62.5-25 MCG/INH AEPB, Inhale 1 puff into the lungs daily., Disp: 1 each, Rfl: 3 .  gabapentin (NEURONTIN) 100 MG capsule, Take 100 mg by mouth 2 (two) times daily. , Disp: , Rfl:  .  IMBRUVICA 420 MG TABS, TAKE 1 TABLET BY MOUTH DAILY, Disp: 28 tablet, Rfl: 2 .  lamoTRIgine (LAMICTAL) 100 MG  tablet, Take 1 tablet (100 mg total) by mouth 2 (two) times daily. ., Disp: 180 tablet, Rfl: 1 .  Magnesium 500 MG CAPS, Take 1 capsule (500 mg total) by mouth 2 (two) times daily at 8 am and 10 pm., Disp: 60 capsule, Rfl: 5 .  meloxicam (MOBIC) 15 MG tablet, Take 1 tablet (15 mg total) by mouth daily., Disp: 180 tablet, Rfl: 0 .  tiZANidine (ZANAFLEX) 2 MG tablet, Take 1 tablet (2 mg total) by mouth every 8 (eight) hours as needed for muscle spasms., Disp: 90 tablet, Rfl: 5 .  traZODone (DESYREL)  50 MG tablet, Take 50-100 mg by mouth at bedtime as needed., Disp: , Rfl:  .  hydrOXYzine (ATARAX/VISTARIL) 10 MG tablet, Take 1-2 tablets (10-20 mg total) by mouth 3 (three) times daily as needed. (Patient not taking: Reported on 01/14/2019), Disp: 90 tablet, Rfl: 3 .  sertraline (ZOLOFT) 25 MG tablet, , Disp: , Rfl:   No Known Allergies  I personally reviewed active problem list, medication list, allergies, family history with the patient/caregiver today.   ROS  Ten systems reviewed and is negative except as mentioned in HPI   Objective  Vitals:   01/14/19 1044  BP: 114/70  Pulse: 71  Resp: 16  Temp: (!) 97.5 F (36.4 C)  TempSrc: Oral  SpO2: 98%  Weight: 170 lb (77.1 kg)  Height: 5\' 7"  (1.702 m)    Body mass index is 26.63 kg/m.  Physical Exam  Constitutional: Patient appears well-developed and well-nourished. No distress.  HEENT: head atraumatic, normocephalic, pupils equal and reactive to light,  neck supple, throat within normal limits Cardiovascular: Normal rate, regular rhythm and normal heart sounds.  No murmur heard. No BLE edema. Pulmonary/Chest: Effort normal and breath sounds normal. No respiratory distress. Abdominal: Soft.  There is no tenderness or distention during exam, normal bowel sounds Skin: very small lesion on right nose bridge, seems to be a small cyst versus comedone, however patient has a personal history of cancer, refer to dermatologist , spider veins  and telangiectasis on legs. Psychiatric: Patient has a normal mood and affect. behavior is normal. Judgment and thought content normal.  Recent Results (from the past 2160 hour(s))  Comprehensive metabolic panel     Status: Abnormal   Collection Time: 10/22/18  9:31 AM  Result Value Ref Range   Sodium 137 135 - 145 mmol/L   Potassium 4.1 3.5 - 5.1 mmol/L   Chloride 106 98 - 111 mmol/L   CO2 26 22 - 32 mmol/L   Glucose, Bld 73 70 - 99 mg/dL   BUN 21 (H) 6 - 20 mg/dL   Creatinine, Ser 0.73 0.44 - 1.00 mg/dL   Calcium 8.3 (L) 8.9 - 10.3 mg/dL   Total Protein 6.3 (L) 6.5 - 8.1 g/dL   Albumin 3.7 3.5 - 5.0 g/dL   AST 16 15 - 41 U/L   ALT 13 0 - 44 U/L   Alkaline Phosphatase 48 38 - 126 U/L   Total Bilirubin 0.6 0.3 - 1.2 mg/dL   GFR calc non Af Amer >60 >60 mL/min   GFR calc Af Amer >60 >60 mL/min   Anion gap 5 5 - 15    Comment: Performed at [facility], [address] [place], [place] [address]  CBC with Differential     Status: Abnormal   Collection Time: 10/22/18  9:31 AM  Result Value Ref Range   WBC 32.7 (H) 4.0 - 10.5 K/uL   RBC 4.24 3.87 - 5.11 MIL/uL   Hemoglobin 13.1 12.0 - 15.0 g/dL   HCT 41.4 36.0 - 46.0 %   MCV 97.6 80.0 - 100.0 fL   MCH 30.9 26.0 - 34.0 pg   MCHC 31.6 30.0 - 36.0 g/dL   RDW 13.2 11.5 - 15.5 %   Platelets 233 150 - 400 K/uL   nRBC 0.0 0.0 - 0.2 %   Neutrophils Relative % 18 %   Neutro Abs 6.0 1.7 - 7.7 K/uL   Lymphocytes Relative 79 %   Lymphs Abs 25.6 (H) 0.7 - 4.0 K/uL  Monocytes Relative 2 %   Monocytes Absolute 0.7 0.1 - 1.0 K/uL   Eosinophils Relative 1 %   Eosinophils Absolute 0.2 0.0 - 0.5 K/uL   Basophils Relative 0 %   Basophils Absolute 0.1 0.0 - 0.1 K/uL   WBC Morphology ABSOLUTE LYMPHOCYTOSIS    Smear Review DIFF CONFIRMED BY MANUAL    Immature Granulocytes 0 %   Abs Immature Granulocytes 0.11 (H) 0.00 - 0.07 K/uL    Comment: Performed at [facility], [address] [place], [place] [address]  Phosphorus      Status: None   Collection Time: 10/22/18  9:31 AM  Result Value Ref Range   Phosphorus 3.3 2.5 - 4.6 mg/dL    Comment: Performed at [facility], [address] [place], [place] [address]  Magnesium     Status: None   Collection Time: 10/22/18  9:31 AM  Result Value Ref Range   Magnesium 2.0 1.7 - 2.4 mg/dL    Comment: Performed at [facility], [place], [place] [address]  POCT Urinalysis Dipstick     Status: Abnormal   Collection Time: 10/27/18  9:58 AM  Result Value Ref Range   Color, UA dark yellow    Clarity, UA cloudy    Glucose, UA Negative Negative   Bilirubin, UA neg    Ketones, UA neg    Spec Grav, UA 1.020 1.010 - 1.025   Blood, UA large    pH, UA 5.0 5.0 - 8.0   Protein, UA Positive (A) Negative    Comment: 30    Urobilinogen, UA 0.2 0.2 or 1.0 E.U./dL   Nitrite, UA neg    Leukocytes, UA Large (3+) (A) Negative   Appearance     Odor    CULTURE, URINE COMPREHENSIVE     Status: Abnormal   Collection Time: 10/27/18 10:41 AM  Result Value Ref Range   MICRO NUMBER: [id number]    SPECIMEN QUALITY: Adequate    Source OTHER (SPECIFY)    STATUS: FINAL    ISOLATE 1: Escherichia coli (A)     Comment: 10,000-50,000 CFU/mL of Escherichia coli      Susceptibility   Escherichia coli - CULT, URN, SPECIAL NEGATIVE 1    AMOX/CLAVULANIC 16 Intermediate     AMPICILLIN >=32 Resistant     AMPICILLIN/SULBACTAM 16 Intermediate     CEFAZOLIN* <=4 Not Reportable      * For infections other than uncomplicated UTIcaused by E. coli, K. pneumoniae or P. mirabilis:Cefazolin is resistant if MIC > or = 8 mcg/mL.(Distinguishing susceptible versus intermediatefor isolates with MIC < or = 4 mcg/mL requiresadditional testing.)For uncomplicated UTI caused by E. coli,K. pneumoniae or P. mirabilis: Cefazolin issusceptible if MIC <32 mcg/mL and predictssusceptible to the oral agents cefaclor, cefdinir,cefpodoxime, cefprozil, cefuroxime, cephalexinand loracarbef.     CEFEPIME <=1 Sensitive     CEFTRIAXONE <=1 Sensitive     CIPROFLOXACIN <=0.25 Sensitive     LEVOFLOXACIN <=0.12 Sensitive     ERTAPENEM <=0.5 Sensitive     GENTAMICIN <=1 Sensitive     IMIPENEM <=0.25 Sensitive     NITROFURANTOIN 64 Intermediate     PIP/TAZO <=4 Sensitive     TOBRAMYCIN <=1 Sensitive     TRIMETH/SULFA* >=320 Resistant      * For infections other than uncomplicated UTIcaused by E. coli, K. pneumoniae or P. mirabilis:Cefazolin is resistant if MIC > or = 8 mcg/mL.(Distinguishing susceptible versus intermediatefor isolates with MIC < or = 4 mcg/mL requiresadditional  testing.)For uncomplicated UTI caused by E. coli,K. pneumoniae or P. mirabilis: Cefazolin issusceptible if MIC <32 mcg/mL and predictssusceptible to the oral agents cefaclor, cefdinir,cefpodoxime, cefprozil, cefuroxime, cephalexinand loracarbef.Legend:S = Susceptible  I = IntermediateR = Resistant  NS = Not susceptible* = Not tested  NR = Not reported**NN = See antimicrobic comments  Comprehensive metabolic panel     Status: Abnormal   Collection Time: 12/10/18  2:40 PM  Result Value Ref Range   Sodium 139 135 - 145 mmol/L   Potassium 4.5 3.5 - 5.1 mmol/L   Chloride 103 98 - 111 mmol/L   CO2 28 22 - 32 mmol/L   Glucose, Bld 88 70 - 99 mg/dL   BUN 24 (H) 6 - 20 mg/dL   Creatinine, Ser 0.81 0.44 - 1.00 mg/dL   Calcium 9.3 8.9 - 10.3 mg/dL   Total Protein 7.0 6.5 - 8.1 g/dL   Albumin 4.2 3.5 - 5.0 g/dL   AST 16 15 - 41 U/L   ALT 14 0 - 44 U/L   Alkaline Phosphatase 49 38 - 126 U/L   Total Bilirubin 0.5 0.3 - 1.2 mg/dL   GFR calc non Af Amer >60 >60 mL/min   GFR calc Af Amer >60 >60 mL/min   Anion gap 8 5 - 15    Comment: Performed at [facility], [address] [place], [place] [address]  CBC with Differential     Status: Abnormal   Collection Time: 12/10/18  2:40 PM  Result Value Ref Range   WBC 16.2 (H) 4.0 - 10.5 K/uL   RBC 4.26 3.87 - 5.11 MIL/uL   Hemoglobin 13.6 12.0 - 15.0 g/dL   HCT 40.6  36.0 - 46.0 %   MCV 95.3 80.0 - 100.0 fL   MCH 31.9 26.0 - 34.0 pg   MCHC 33.5 30.0 - 36.0 g/dL   RDW 13.8 11.5 - 15.5 %   Platelets 205 150 - 400 K/uL   nRBC 0.0 0.0 - 0.2 %   Neutrophils Relative % 23 %   Neutro Abs 3.7 1.7 - 7.7 K/uL   Lymphocytes Relative 72 %   Lymphs Abs 11.7 (H) 0.7 - 4.0 K/uL   Monocytes Relative 4 %   Monocytes Absolute 0.7 0.1 - 1.0 K/uL   Eosinophils Relative 0 %   Eosinophils Absolute 0.1 0.0 - 0.5 K/uL   Basophils Relative 1 %   Basophils Absolute 0.1 0.0 - 0.1 K/uL   WBC Morphology ABSOLUTE LYMPHOCYTOSIS     Comment: DIFF CONFIRMED BY MANUAL CONSISTANT WITH KNOWN CLL   Immature Granulocytes 0 %   Abs Immature Granulocytes 0.04 0.00 - 0.07 K/uL    Comment: Performed at [facility], [address] [place], [place] [address]  Phosphorus     Status: None   Collection Time: 12/10/18  2:40 PM  Result Value Ref Range   Phosphorus 3.9 2.5 - 4.6 mg/dL    Comment: Performed at [facility], [address] [place], [place] [address]  Magnesium     Status: None   Collection Time: 12/10/18  2:40 PM  Result Value Ref Range   Magnesium 2.2 1.7 - 2.4 mg/dL    Comment: Performed at [facility], [address] [place], [place] [address]      PHQ2/9: Depression screen [facility] 2/9 01/14/2019 10/27/2018 10/20/2018 09/24/2018 08/21/2018  Decreased Interest 1 0 0 0 0  Down, Depressed, Hopeless 0 0 1 0 0  PHQ - 2 Score 1 0  1 0 0  Altered sleeping 2 - 0 - 0  Tired, decreased energy 3 - 1 - 0  Change in appetite 1 - 1 - 0  Feeling bad or failure about yourself  0 - 0 - 0  Trouble concentrating 1 - 1 - 0  Moving slowly or fidgety/restless 0 - 0 - 0  Suicidal thoughts 0 - 0 - 0  PHQ-9 Score 8 - 4 - 0  Difficult doing work/chores Not difficult at all - Not difficult at all - Not difficult at all  Some recent data might be hidden    phq 9 is positive   Fall Risk: Fall Risk  01/14/2019 10/20/2018 09/24/2018 09/10/2018 08/21/2018  Falls in the past  year? 1 0 0 0 0  Number falls in past yr: 0 0 - - 0  Injury with Fall? 0 0 - - 0     Functional Status Survey: Is the patient deaf or have difficulty hearing?: No Does the patient have difficulty seeing, even when wearing glasses/contacts?: No Does the patient have difficulty concentrating, remembering, or making decisions?: No Does the patient have difficulty walking or climbing stairs?: No Does the patient have difficulty dressing or bathing?: No Does the patient have difficulty doing errands alone such as visiting a doctor's office or shopping?: No    Assessment & Plan   1. Change in bowel movement  - Ambulatory referral to Gastroenterology - polyethylene glycol powder (GLYCOLAX/MIRALAX) 17 GM/SCOOP powder; Take 17 g by mouth 2 (two) times daily as needed.  Dispense: 3350 g; Refill: 0  2. Abdominal distension  - Ambulatory referral to Gastroenterology  3. Skin lesion of face  - Ambulatory referral to Dermatology  4. Cystitis  - CULTURE, URINE COMPREHENSIVE

## 2019-01-16 LAB — CULTURE, URINE COMPREHENSIVE
MICRO NUMBER:: 435392
RESULT:: NO GROWTH
SPECIMEN QUALITY:: ADEQUATE

## 2019-01-17 ENCOUNTER — Encounter: Payer: Self-pay | Admitting: Family Medicine

## 2019-01-25 ENCOUNTER — Encounter: Payer: Self-pay | Admitting: Family Medicine

## 2019-01-25 ENCOUNTER — Ambulatory Visit
Admission: RE | Admit: 2019-01-25 | Discharge: 2019-01-25 | Disposition: A | Discharge status: Home / Self Care | Payer: BLUE CROSS/BLUE SHIELD | Source: Ambulatory Visit | Attending: Family Medicine | Admitting: Family Medicine

## 2019-01-25 ENCOUNTER — Ambulatory Visit (INDEPENDENT_AMBULATORY_CARE_PROVIDER_SITE_OTHER): Payer: BLUE CROSS/BLUE SHIELD | Admitting: Family Medicine

## 2019-01-25 ENCOUNTER — Other Ambulatory Visit: Payer: Self-pay

## 2019-01-25 VITALS — BP 120/80 | HR 75 | Temp 97.7°F | Resp 16 | Ht 67.0 in | Wt 167.1 lb

## 2019-01-25 DIAGNOSIS — J340 Abscess, furuncle and carbuncle of nose: Secondary | ICD-10-CM

## 2019-01-25 DIAGNOSIS — R6884 Jaw pain: Secondary | ICD-10-CM | POA: Diagnosis not present

## 2019-01-25 DIAGNOSIS — R202 Paresthesia of skin: Secondary | ICD-10-CM

## 2019-01-25 DIAGNOSIS — R200 Anesthesia of skin: Secondary | ICD-10-CM | POA: Diagnosis not present

## 2019-01-25 MED ORDER — GADOBUTROL 1 MMOL/ML IV SOLN
7.0000 mL | Freq: Once | INTRAVENOUS | Status: AC | PRN
  Administered 2019-01-25: 7 mL via INTRAVENOUS

## 2019-01-25 MED ORDER — MUPIROCIN CALCIUM 2 % EX CREA: 1.0000 "application " | TOPICAL_CREAM | Freq: Two times a day (BID) | CUTANEOUS | 0 refills | Status: DC

## 2019-01-25 NOTE — Progress Notes (Signed)
Name: Monique [name]   MRN: [id number]    DOB: 07/14/1971   Date:01/25/2019       Progress Note  Subjective  Chief Complaint  Chief Complaint  Patient presents with  . Mass    on her nose x 5 days. It is spreading to other places on her face. Draining clear liquid.     HPI  Skin sore: she states it started as a pimple like about 5 days ago, she popped and it got larger, started to drain a yellowish and clear liquid, denies any pain or burning sensation. She states noticed some other bumps since, but not as large on her face.   Patient Active Problem List   Diagnosis Date Noted  . Trigeminal neuralgia 10/05/2018  . Lumbar radiculitis (Right) 09/24/2018  . Hypocalcemia 06/03/2018  . Vitamin D insufficiency 06/03/2018  . Abnormal MRI, lumbar spine (04/20/2017) 06/03/2018  . Chronic hip pain (Right) 06/03/2018  . Spondylosis without myelopathy or radiculopathy, lumbar region 06/03/2018  . DDD (degenerative disc disease), lumbar 06/02/2018  . Lumbar facet hypertrophy 06/02/2018  . Lumbar facet arthropathy 06/02/2018  . Lumbar facet syndrome (Bilateral) (R>L) 06/02/2018  . Marijuana use 05/19/2018  . Chronic lower extremity pain (Primary Area of Pain) (Bilateral) (R>L) 05/13/2018  . Chronic low back pain (Secondary Area of Pain) (Bilateral) (R>L) w/ sciatica (Bilateral) 05/13/2018  . Chronic pain syndrome 05/13/2018  . Opiate use 05/13/2018  . Disorder of skeletal system 05/13/2018  . Pharmacologic therapy 05/13/2018  . Problems influencing health status 05/13/2018  . Mastalgia 05/05/2018  . Breast mass, right 01/07/2018  . Facial pain 12/11/2017  . Tingling 12/11/2017  . Thoracic aortic atherosclerosis ([place]) 08/20/2017  . Emphysema of lung ([place]) 08/20/2017  . Adductor tendinitis 04/23/2017  . Trochanteric bursitis of right hip 04/23/2017  . GERD without esophagitis 12/11/2016  . CLL (chronic lymphocytic leukemia) ([place]) 11/24/2016  . Pap smear abnormality of cervix/human  papillomavirus (HPV) positive 09/12/2016  . Stress incontinence 09/04/2016  . B12 deficiency 07/10/2015  . Insomnia, persistent 07/10/2015  . Anorexia nervosa, restricting type 07/10/2015  . Anxiety, generalized 07/10/2015  . H/O suicide attempt 07/10/2015  . Lymphocytosis 07/10/2015  . Obsessive-compulsive disorder 07/10/2015  . Tobacco use 07/10/2015  . History of cervical dysplasia 07/18/2014    Past Surgical History:  Procedure Laterality Date  . BREAST BIOPSY Right 01/05/2018   US guided biopsy of 2 areas and 1 lymph node, MIXED INFLAMMATION AND GIANT CELL REACTION  . CERVICAL BIOPSY  W/ LOOP ELECTRODE EXCISION    . OTHER SURGICAL HISTORY     scar tissue removed from vocal cords  . TUBAL LIGATION      Family History  Problem Relation Age of Onset  . Cancer Mother        thyroid  . Alcohol abuse Father   . Alcohol abuse Brother   . Depression Brother   . Bipolar disorder Brother   . ADD / ADHD Son   . Breast cancer Neg Hx     Social History   Socioeconomic History  . Marital status: Divorced    Spouse name: Not on file  . Number of children: 3  . Years of education: Not on file  . Highest education level: Not on file  Occupational History  . Occupation: Best boy: [employer]  . Financial resource strain: Somewhat hard  . Food insecurity:    Worry: Never true    Inability: Never true  . Transportation  needs:    Medical: No    Non-medical: No  Tobacco Use  . Smoking status: Current Every Day Smoker    Packs/day: 0.50    Years: 31.00    Pack years: 15.50    Types: Cigarettes    Start date: 09/25/1986  . Smokeless tobacco: Never Used  . Tobacco comment: taking chantix again and is cutting down on amount   Substance and Sexual Activity  . Alcohol use: Yes    Alcohol/week: 0.0 standard drinks    Comment: rarely  . Drug use: Yes    Types: Marijuana  . Sexual activity: Yes    Partners: Male    Birth control/protection: None   Lifestyle  . Physical activity:    Days per week: 5 days    Minutes per session: 30 min  . Stress: Not at all  Relationships  . Social connections:    Talks on phone: More than three times a week    Gets together: Twice a week    Attends religious service: 1 to 4 times per year    Active member of club or organization: Yes    Attends meetings of clubs or organizations: 1 to 4 times per year    Relationship status: Divorced  . Intimate partner violence:    Fear of current or ex partner: No    Emotionally abused: No    Physically abused: No    Forced sexual activity: No  Other Topics Concern  . Not on file  Social History Narrative  . Not on file     Current Outpatient Medications:  .  Calcium Carbonate-Vit D-Min (GNP CALCIUM 1200) 1200-1000 MG-UNIT CHEW, Chew 1,200 mg by mouth daily with breakfast. Take in combination with vitamin D and magnesium., Disp: 30 tablet, Rfl: 5 .  CHANTIX CONTINUING MONTH PAK 1 MG tablet, , Disp: , Rfl:  .  Fluticasone-Umeclidin-Vilant (TRELEGY ELLIPTA) 100-62.5-25 MCG/INH AEPB, Inhale 1 puff into the lungs daily., Disp: 1 each, Rfl: 3 .  gabapentin (NEURONTIN) 100 MG capsule, Take 100 mg by mouth 2 (two) times daily. , Disp: , Rfl:  .  IMBRUVICA 420 MG TABS, TAKE 1 TABLET BY MOUTH DAILY, Disp: 28 tablet, Rfl: 2 .  lamoTRIgine (LAMICTAL) 100 MG tablet, Take 1 tablet (100 mg total) by mouth 2 (two) times daily. ., Disp: 180 tablet, Rfl: 1 .  Magnesium 500 MG CAPS, Take 1 capsule (500 mg total) by mouth 2 (two) times daily at 8 am and 10 pm., Disp: 60 capsule, Rfl: 5 .  meloxicam (MOBIC) 15 MG tablet, Take 1 tablet (15 mg total) by mouth daily., Disp: 180 tablet, Rfl: 0 .  polyethylene glycol powder (GLYCOLAX/MIRALAX) 17 GM/SCOOP powder, Take 17 g by mouth 2 (two) times daily as needed., Disp: 3350 g, Rfl: 0 .  tiZANidine (ZANAFLEX) 2 MG tablet, Take 1 tablet (2 mg total) by mouth every 8 (eight) hours as needed for muscle spasms., Disp: 90 tablet, Rfl:  5 .  traZODone (DESYREL) 50 MG tablet, Take 50-100 mg by mouth at bedtime as needed., Disp: , Rfl:  .  sertraline (ZOLOFT) 25 MG tablet, , Disp: , Rfl:   No Known Allergies  I personally reviewed active problem list, medication list, allergies, family history with the patient/caregiver today.   ROS  Focused reviewed and negative   Objective  Vitals:   01/25/19 1157  BP: 120/80  Pulse: 75  Resp: 16  Temp: 97.7 F (36.5 C)  TempSrc: Oral  SpO2: 98%  Weight: 167 lb 1.6 oz (75.8 kg)  Height: 5\' 7"  (1.702 m)    Body mass index is 26.17 kg/m.  Physical Exam  Constitutional: Patient appears well-developed and well-nourished.  No distress.  HEENT: head atraumatic, normocephalic, pupils equal and reactive to light,  neck supple Cardiovascular: Normal rate, regular rhythm and normal heart sounds.  No murmur heard. No BLE edema. Pulmonary/Chest: Effort normal and breath sounds normal. No respiratory distress. Abdominal: Soft.  There is no tenderness. Skin: dime size lesion on left side of nose, erythematous base and ulceration in the center, no oozing at this time, some other small lesions around  Psychiatric: Patient has a normal mood and affect. behavior is normal. Judgment and thought content normal.  Recent Results (from the past 2160 hour(s))  Comprehensive metabolic panel     Status: Abnormal   Collection Time: 12/10/18  2:40 PM  Result Value Ref Range   Sodium 139 135 - 145 mmol/L   Potassium 4.5 3.5 - 5.1 mmol/L   Chloride 103 98 - 111 mmol/L   CO2 28 22 - 32 mmol/L   Glucose, Bld 88 70 - 99 mg/dL   BUN 24 (H) 6 - 20 mg/dL   Creatinine, Ser 0.81 0.44 - 1.00 mg/dL   Calcium 9.3 8.9 - 10.3 mg/dL   Total Protein 7.0 6.5 - 8.1 g/dL   Albumin 4.2 3.5 - 5.0 g/dL   AST 16 15 - 41 U/L   ALT 14 0 - 44 U/L   Alkaline Phosphatase 49 38 - 126 U/L   Total Bilirubin 0.5 0.3 - 1.2 mg/dL   GFR calc non Af Amer >60 >60 mL/min   GFR calc Af Amer >60 >60 mL/min   Anion gap 8 5  - 15    Comment: Performed at [facility], [address] [place], [place] [address]  CBC with Differential     Status: Abnormal   Collection Time: 12/10/18  2:40 PM  Result Value Ref Range   WBC 16.2 (H) 4.0 - 10.5 K/uL   RBC 4.26 3.87 - 5.11 MIL/uL   Hemoglobin 13.6 12.0 - 15.0 g/dL   HCT 40.6 36.0 - 46.0 %   MCV 95.3 80.0 - 100.0 fL   MCH 31.9 26.0 - 34.0 pg   MCHC 33.5 30.0 - 36.0 g/dL   RDW 13.8 11.5 - 15.5 %   Platelets 205 150 - 400 K/uL   nRBC 0.0 0.0 - 0.2 %   Neutrophils Relative % 23 %   Neutro Abs 3.7 1.7 - 7.7 K/uL   Lymphocytes Relative 72 %   Lymphs Abs 11.7 (H) 0.7 - 4.0 K/uL   Monocytes Relative 4 %   Monocytes Absolute 0.7 0.1 - 1.0 K/uL   Eosinophils Relative 0 %   Eosinophils Absolute 0.1 0.0 - 0.5 K/uL   Basophils Relative 1 %   Basophils Absolute 0.1 0.0 - 0.1 K/uL   WBC Morphology ABSOLUTE LYMPHOCYTOSIS     Comment: DIFF CONFIRMED BY MANUAL CONSISTANT WITH KNOWN CLL   Immature Granulocytes 0 %   Abs Immature Granulocytes 0.04 0.00 - 0.07 K/uL    Comment: Performed at [facility], [address] [place], [place] [address]  Phosphorus     Status: None   Collection Time: 12/10/18  2:40 PM  Result Value Ref Range   Phosphorus 3.9 2.5 - 4.6 mg/dL    Comment: Performed at [facility], [address] [place], [place] [address]  Magnesium  Status: None   Collection Time: 12/10/18  2:40 PM  Result Value Ref Range   Magnesium 2.2 1.7 - 2.4 mg/dL    Comment: Performed at [facility], [address] [place], [place] [address]  CULTURE, URINE COMPREHENSIVE     Status: None   Collection Time: 01/14/19 11:01 AM  Result Value Ref Range   MICRO NUMBER: [id number]    SPECIMEN QUALITY: Adequate    Source URINE    STATUS: FINAL    RESULT: No Growth       PHQ2/9: Depression screen [facility] 2/9 01/25/2019 01/14/2019 10/27/2018 10/20/2018 09/24/2018  Decreased Interest 1 1 0 0 0  Down, Depressed, Hopeless 1 0 0 1 0  PHQ - 2 Score 2 1  0 1 0  Altered sleeping 1 2 - 0 -  Tired, decreased energy 3 3 - 1 -  Change in appetite 2 1 - 1 -  Feeling bad or failure about yourself  0 0 - 0 -  Trouble concentrating 0 1 - 1 -  Moving slowly or fidgety/restless 0 0 - 0 -  Suicidal thoughts 0 0 - 0 -  PHQ-9 Score 8 8 - 4 -  Difficult doing work/chores Not difficult at all Not difficult at all - Not difficult at all -  Some recent data might be hidden    phq 9 is positive  Fall Risk: Fall Risk  01/25/2019 01/14/2019 10/20/2018 09/24/2018 09/10/2018  Falls in the past year? 0 1 0 0 0  Number falls in past yr: 0 0 0 - -  Injury with Fall? 0 0 0 - -     Functional Status Survey: Is the patient deaf or have difficulty hearing?: No Does the patient have difficulty seeing, even when wearing glasses/contacts?: No Does the patient have difficulty concentrating, remembering, or making decisions?: No Does the patient have difficulty walking or climbing stairs?: No Does the patient have difficulty dressing or bathing?: No Does the patient have difficulty doing errands alone such as visiting a doctor's office or shopping?: No    Assessment & Plan  1. Nose ulceration  - Viral culture ( not done ) out of swab - Anaerobic and Aerobic Culture - mupirocin cream (BACTROBAN) 2 %; Apply 1 application topically 2 (two) times daily.  Dispense: 15 g; Refill: 0

## 2019-01-26 ENCOUNTER — Telehealth: Payer: Self-pay | Admitting: Family Medicine

## 2019-01-26 ENCOUNTER — Ambulatory Visit (INDEPENDENT_AMBULATORY_CARE_PROVIDER_SITE_OTHER): Payer: BLUE CROSS/BLUE SHIELD | Admitting: Gastroenterology

## 2019-01-26 ENCOUNTER — Other Ambulatory Visit: Payer: Self-pay | Admitting: Family Medicine

## 2019-01-26 DIAGNOSIS — J340 Abscess, furuncle and carbuncle of nose: Secondary | ICD-10-CM

## 2019-01-26 DIAGNOSIS — R194 Change in bowel habit: Secondary | ICD-10-CM | POA: Diagnosis not present

## 2019-01-26 MED ORDER — LINACLOTIDE 145 MCG PO CAPS: 145.0000 ug | ORAL_CAPSULE | Freq: Every day | ORAL | 0 refills | Status: DC

## 2019-01-26 MED ORDER — MUPIROCIN 2 % EX OINT: 1.0000 "application " | TOPICAL_OINTMENT | Freq: Two times a day (BID) | CUTANEOUS | 0 refills | Status: DC

## 2019-01-26 MED ORDER — MUPIROCIN CALCIUM 2 % EX CREA: 1.0000 "application " | TOPICAL_CREAM | Freq: Two times a day (BID) | CUTANEOUS | 0 refills | Status: DC

## 2019-01-26 NOTE — Addendum Note (Signed)
Addended by: Dorethea Clan on: 01/26/2019 10:11 AM   Modules accepted: Orders

## 2019-01-26 NOTE — Telephone Encounter (Signed)
Copied from [place]. Topic: Quick Communication - Rx Refill/Question >> Jan 26, 2019  8:19 AM Scherrie Gerlach wrote: Medication: mupirocin cream (BACTROBAN) 2 %  Pt states the insurance will not pay for the cream, they will pay for the OINTMENT.  Pt requesting this be resent as mupirocin ointment.  [id number] - [place], [place] - [address] [phone] (Phone) [phone] (Fax)  Also wants you to know she is getting another one on lower left side chin, and it itches.
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
## 2019-01-27 ENCOUNTER — Other Ambulatory Visit: Payer: Self-pay

## 2019-01-27 ENCOUNTER — Inpatient Hospital Stay: Payer: BLUE CROSS/BLUE SHIELD | Attending: Oncology

## 2019-01-27 VITALS — BP 110/72 | HR 79 | Temp 97.5°F | Resp 16

## 2019-01-27 DIAGNOSIS — C9110 Chronic lymphocytic leukemia of B-cell type not having achieved remission: Secondary | ICD-10-CM | POA: Diagnosis not present

## 2019-01-27 LAB — CBC WITH DIFFERENTIAL/PLATELET
Abs Immature Granulocytes: 0.01 10*3/uL (ref 0.00–0.07)
Basophils Absolute: 0.1 10*3/uL (ref 0.0–0.1)
Basophils Relative: 1 %
Eosinophils Absolute: 0 10*3/uL (ref 0.0–0.5)
Eosinophils Relative: 0 %
HCT: 41.5 % (ref 36.0–46.0)
Hemoglobin: 13.8 g/dL (ref 12.0–15.0)
Immature Granulocytes: 0 %
Lymphocytes Relative: 85 %
Lymphs Abs: 7 10*3/uL — ABNORMAL HIGH (ref 0.7–4.0)
MCH: 31.9 pg (ref 26.0–34.0)
MCHC: 33.3 g/dL (ref 30.0–36.0)
MCV: 96.1 fL (ref 80.0–100.0)
Monocytes Absolute: 0.6 10*3/uL (ref 0.1–1.0)
Monocytes Relative: 7 %
Neutro Abs: 0.5 10*3/uL — ABNORMAL LOW (ref 1.7–7.7)
Neutrophils Relative %: 7 %
Platelets: 163 10*3/uL (ref 150–400)
RBC: 4.32 MIL/uL (ref 3.87–5.11)
RDW: 13.1 % (ref 11.5–15.5)
Smear Review: NORMAL
WBC: 8.2 10*3/uL (ref 4.0–10.5)
nRBC: 0 % (ref 0.0–0.2)

## 2019-01-27 LAB — COMPREHENSIVE METABOLIC PANEL
ALT: 13 U/L (ref 0–44)
AST: 21 U/L (ref 15–41)
Albumin: 4.3 g/dL (ref 3.5–5.0)
Alkaline Phosphatase: 57 U/L (ref 38–126)
Anion gap: 8 (ref 5–15)
BUN: 21 mg/dL — ABNORMAL HIGH (ref 6–20)
CO2: 24 mmol/L (ref 22–32)
Calcium: 8.8 mg/dL — ABNORMAL LOW (ref 8.9–10.3)
Chloride: 105 mmol/L (ref 98–111)
Creatinine, Ser: 0.89 mg/dL (ref 0.44–1.00)
GFR calc Af Amer: >60 mL/min (ref >60)
GFR calc non Af Amer: >60 mL/min (ref >60)
Glucose, Bld: 141 mg/dL — ABNORMAL HIGH (ref 70–99)
Potassium: 3.7 mmol/L (ref 3.5–5.1)
Sodium: 137 mmol/L (ref 135–145)
Total Bilirubin: 0.8 mg/dL (ref 0.3–1.2)
Total Protein: 7.1 g/dL (ref 6.5–8.1)

## 2019-01-27 LAB — PHOSPHORUS: Phosphorus: 2.7 mg/dL (ref 2.5–4.6)

## 2019-01-27 LAB — MAGNESIUM: Magnesium: 2.1 mg/dL (ref 1.7–2.4)
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
## 2019-01-28 ENCOUNTER — Ambulatory Visit: Payer: BLUE CROSS/BLUE SHIELD | Admitting: Oncology

## 2019-01-28 ENCOUNTER — Encounter: Payer: Self-pay | Admitting: Oncology

## 2019-01-28 ENCOUNTER — Inpatient Hospital Stay: Payer: BLUE CROSS/BLUE SHIELD | Attending: Oncology | Admitting: Oncology

## 2019-01-28 ENCOUNTER — Other Ambulatory Visit: Payer: Self-pay | Admitting: Family Medicine

## 2019-01-28 ENCOUNTER — Other Ambulatory Visit: Payer: BLUE CROSS/BLUE SHIELD

## 2019-01-28 DIAGNOSIS — J439 Emphysema, unspecified: Secondary | ICD-10-CM

## 2019-01-28 DIAGNOSIS — F418 Other specified anxiety disorders: Secondary | ICD-10-CM | POA: Diagnosis not present

## 2019-01-28 DIAGNOSIS — C9110 Chronic lymphocytic leukemia of B-cell type not having achieved remission: Secondary | ICD-10-CM

## 2019-01-28 DIAGNOSIS — Z79899 Other long term (current) drug therapy: Secondary | ICD-10-CM

## 2019-01-28 DIAGNOSIS — F17210 Nicotine dependence, cigarettes, uncomplicated: Secondary | ICD-10-CM

## 2019-01-28 NOTE — Progress Notes (Signed)
Patient visit today for follow up on CLL. Patient continues to take Imbruvica, states she is tolerating well. Patient reports changes in bowel habits, is being followed by GI. Patient was started on Linzess this week and is to be scheduled for colonoscopy in 2 weeks. Patient also reports changes in mood, gets upset and cries much easier and more frequently.

## 2019-01-29 ENCOUNTER — Other Ambulatory Visit: Payer: Self-pay | Admitting: Family Medicine

## 2019-01-29 DIAGNOSIS — J439 Emphysema, unspecified: Secondary | ICD-10-CM

## 2019-01-29 MED ORDER — FLUTICASONE-UMECLIDIN-VILANT 100-62.5-25 MCG/INH IN AEPB: 1.0000 | INHALATION_SPRAY | Freq: Every day | RESPIRATORY_TRACT | 3 refills | Status: DC

## 2019-01-31 ENCOUNTER — Other Ambulatory Visit: Payer: Self-pay | Admitting: Family Medicine

## 2019-01-31 LAB — ANAEROBIC AND AEROBIC CULTURE
AER RESULT:: NO GROWTH
MICRO NUMBER:: 465275
MICRO NUMBER:: 465290
SPECIMEN QUALITY:: ADEQUATE
SPECIMEN QUALITY:: ADEQUATE

## 2019-01-31 LAB — VIRAL CULTURE VIRC

## 2019-02-02 ENCOUNTER — Other Ambulatory Visit: Payer: Self-pay | Admitting: Oncology

## 2019-02-04 ENCOUNTER — Telehealth: Payer: Self-pay | Admitting: Pharmacist

## 2019-02-04 DIAGNOSIS — L720 Epidermal cyst: Secondary | ICD-10-CM | POA: Diagnosis not present

## 2019-02-04 DIAGNOSIS — L710 Perioral dermatitis: Secondary | ICD-10-CM | POA: Diagnosis not present

## 2019-02-04 DIAGNOSIS — D485 Neoplasm of uncertain behavior of skin: Secondary | ICD-10-CM | POA: Diagnosis not present

## 2019-02-04 NOTE — Telephone Encounter (Signed)
Oral Chemotherapy Pharmacist Encounter  Follow-Up Form  [facility] called Monique [name] today to follow up regarding patient's oral chemotherapy medication: Imbruvica (ibrutinib)  Original Start date of oral chemotherapy: 07/2018  Pt reported to [place] no missed doses in the last 30 days.  Pt reports the following side effects: None reported  Recent labs reviewed: CBC from 01/27/2019  New medications?: She reported starting linzess, metronidazole cream, and sertraline.  - Interaction with sertraline, , the main concern is the antiplatelet affect and her recent lab work showed her platelets were still wnl. Platelets will continue to be monitored.   Other Issues: None reported  Patient knows to call the office with questions or concerns. Oral Oncology Clinic will continue to follow.  [name], PharmD, BCPS, [facility] Hematology/Oncology Clinical Pharmacist [facility]/AP [facility] [phone]  02/04/2019 3:04 PM

## 2019-02-15 MED FILL — IMBRUVICA 420 MG TAB: 420 | 28 days supply | Qty: 28 | Fill #1

## 2019-03-09 ENCOUNTER — Ambulatory Visit: Payer: BLUE CROSS/BLUE SHIELD | Admitting: Gastroenterology

## 2019-03-09 ENCOUNTER — Telehealth: Payer: Self-pay | Admitting: Gastroenterology

## 2019-03-09 ENCOUNTER — Other Ambulatory Visit: Payer: Self-pay

## 2019-03-09 DIAGNOSIS — R194 Change in bowel habit: Secondary | ICD-10-CM

## 2019-03-09 MED ORDER — NA SULFATE-K SULFATE-MG SULF 17.5-3.13-1.6 GM/177ML PO SOLN: 1.0000 | Freq: Once | ORAL | 0 refills | Status: AC

## 2019-03-09 MED ORDER — LINACLOTIDE 72 MCG PO CAPS: 72.0000 ug | ORAL_CAPSULE | Freq: Every day | ORAL | 0 refills | Status: DC

## 2019-03-12 ENCOUNTER — Other Ambulatory Visit
Admission: RE | Admit: 2019-03-12 | Discharge: 2019-03-12 | Disposition: A | Discharge status: Home / Self Care | Payer: BC Managed Care – PPO | Source: Ambulatory Visit | Attending: Gastroenterology | Admitting: Gastroenterology

## 2019-03-12 ENCOUNTER — Other Ambulatory Visit: Payer: Self-pay

## 2019-03-12 DIAGNOSIS — Z1159 Encounter for screening for other viral diseases: Secondary | ICD-10-CM | POA: Diagnosis not present

## 2019-03-14 LAB — NOVEL CORONAVIRUS, NAA (HOSP ORDER, SEND-OUT TO REF LAB; TAT 18-24 HRS): SARS-CoV-2, NAA: NOT DETECTED

## 2019-03-17 MED FILL — IMBRUVICA 420 MG TAB: 420 | 28 days supply | Qty: 28 | Fill #2

## 2019-03-31 ENCOUNTER — Ambulatory Visit: Payer: BC Managed Care – PPO | Admitting: Gastroenterology

## 2019-04-05 ENCOUNTER — Ambulatory Visit (INDEPENDENT_AMBULATORY_CARE_PROVIDER_SITE_OTHER): Payer: BC Managed Care – PPO | Admitting: Family Medicine

## 2019-04-05 ENCOUNTER — Encounter: Payer: Self-pay | Admitting: Family Medicine

## 2019-04-05 ENCOUNTER — Other Ambulatory Visit: Payer: Self-pay

## 2019-04-05 DIAGNOSIS — R1013 Epigastric pain: Secondary | ICD-10-CM | POA: Diagnosis not present

## 2019-04-05 DIAGNOSIS — K219 Gastro-esophageal reflux disease without esophagitis: Secondary | ICD-10-CM | POA: Diagnosis not present

## 2019-04-05 MED ORDER — OMEPRAZOLE 20 MG PO CPDR: 20.0000 mg | DELAYED_RELEASE_CAPSULE | Freq: Two times a day (BID) | ORAL | 1 refills | Status: DC

## 2019-04-05 NOTE — Patient Instructions (Signed)
To help with reflux: - Elevate your head of bed - either with a few extra pillows, a wedge pillow, or by placing two bed risers under the head of bed posts. - Avoid the following foods: citrus, fatty foods, chocolate, peppermint, and excessive alcohol, along with sodas, orange juice (acidic drinks) - Stop eating at least 3 hours before going to bed, minimize naps/laying down after eating. - No smoking. - If you are overweight or obese, exercising and losing weight will also help your symptoms. - Caution: prolonged use of proton pump inhibitors like omeprazole (Prilosec), pantoprazole (Protonix), esomeprazole (Nexium), and others like Dexilant and Aciphex may increase your risk of pneumonia, Clostridium difficile colitis, osteoporosis, anemia and other health complications  

## 2019-04-05 NOTE — Progress Notes (Signed)
Name: Monique [name]   MRN: [id number]    DOB: 1970/11/11   Date:04/05/2019       Progress Note  Subjective  Chief Complaint  Chief Complaint  Patient presents with  . Gastroesophageal Reflux    hard to eat some food, nausea, stomach feels like on fire    I connected with  [name]  on 04/05/19 at  7:40 AM EDT by a video enabled telemedicine application and verified that I am speaking with the correct person using two identifiers.  I discussed the limitations of evaluation and management by telemedicine and the availability of in person appointments. The patient expressed understanding and agreed to proceed. Staff also discussed with the patient that there may be a patient responsible charge related to this service. Patient Location: Home Provider Location: Home Additional Individuals present: None  HPI  Pt presents with concern for abdominal burning for a few months.  Yesterday she ate some regular nacho Doritos, and her stomach felt like it was "on fire".  She is feeling nauseous, close to vomiting; also having burning in the bit of her stomach and in her throat.  She has not tried anything OTC.  She does note some tenderness of the epigastric area.  No blood in stool, dark and tarry stools, no vomiting, fevers/chills.  She is in treatment for non-hodgkin's lymphoma; also scheduled for colonoscopy with Dr. Vicente Males on 04/21/2019.  Patient Active Problem List   Diagnosis Date Noted  . Trigeminal neuralgia 10/05/2018  . Lumbar radiculitis (Right) 09/24/2018  . Hypocalcemia 06/03/2018  . Vitamin D insufficiency 06/03/2018  . Abnormal MRI, lumbar spine (04/20/2017) 06/03/2018  . Chronic hip pain (Right) 06/03/2018  . Spondylosis without myelopathy or radiculopathy, lumbar region 06/03/2018  . DDD (degenerative disc disease), lumbar 06/02/2018  . Lumbar facet hypertrophy 06/02/2018  . Lumbar facet arthropathy 06/02/2018  . Lumbar facet syndrome (Bilateral) (R>L) 06/02/2018  .  Marijuana use 05/19/2018  . Chronic lower extremity pain (Primary Area of Pain) (Bilateral) (R>L) 05/13/2018  . Chronic low back pain (Secondary Area of Pain) (Bilateral) (R>L) w/ sciatica (Bilateral) 05/13/2018  . Chronic pain syndrome 05/13/2018  . Opiate use 05/13/2018  . Disorder of skeletal system 05/13/2018  . Pharmacologic therapy 05/13/2018  . Problems influencing health status 05/13/2018  . Mastalgia 05/05/2018  . Breast mass, right 01/07/2018  . Facial pain 12/11/2017  . Tingling 12/11/2017  . Thoracic aortic atherosclerosis ([place]) 08/20/2017  . Emphysema of lung ([place]) 08/20/2017  . Adductor tendinitis 04/23/2017  . Trochanteric bursitis of right hip 04/23/2017  . GERD without esophagitis 12/11/2016  . CLL (chronic lymphocytic leukemia) ([place]) 11/24/2016  . Pap smear abnormality of cervix/human papillomavirus (HPV) positive 09/12/2016  . Stress incontinence 09/04/2016  . B12 deficiency 07/10/2015  . Insomnia, persistent 07/10/2015  . Anorexia nervosa, restricting type 07/10/2015  . Anxiety, generalized 07/10/2015  . H/O suicide attempt 07/10/2015  . Lymphocytosis 07/10/2015  . Obsessive-compulsive disorder 07/10/2015  . Tobacco use 07/10/2015  . History of cervical dysplasia 07/18/2014    Social History   Tobacco Use  . Smoking status: Current Every Day Smoker    Packs/day: 0.50    Years: 31.00    Pack years: 15.50    Types: Cigarettes    Start date: 09/25/1986  . Smokeless tobacco: Never Used  . Tobacco comment: taking chantix again and is cutting down on amount   Substance Use Topics  . Alcohol use: Yes    Alcohol/week: 0.0 standard drinks  Comment: rarely     Current Outpatient Medications:  .  Calcium Carbonate-Vit D-Min (GNP CALCIUM 1200) 1200-1000 MG-UNIT CHEW, Chew 1,200 mg by mouth daily with breakfast. Take in combination with vitamin D and magnesium., Disp: 30 tablet, Rfl: 5 .  Fluticasone-Umeclidin-Vilant (TRELEGY ELLIPTA) 100-62.5-25 MCG/INH  AEPB, Inhale 1 puff into the lungs daily., Disp: 1 each, Rfl: 3 .  gabapentin (NEURONTIN) 100 MG capsule, Take 100 mg by mouth 2 (two) times daily. , Disp: , Rfl:  .  IMBRUVICA 420 MG TABS, TAKE 1 TABLET BY MOUTH DAILY, Disp: 28 tablet, Rfl: 2 .  lamoTRIgine (LAMICTAL) 100 MG tablet, Take 1 tablet (100 mg total) by mouth 2 (two) times daily. ., Disp: 180 tablet, Rfl: 1 .  linaclotide (LINZESS) 72 MCG capsule, Take 1 capsule (72 mcg total) by mouth daily before breakfast., Disp: 90 capsule, Rfl: 0 .  Magnesium 500 MG CAPS, Take 1 capsule (500 mg total) by mouth 2 (two) times daily at 8 am and 10 pm., Disp: 60 capsule, Rfl: 5 .  meloxicam (MOBIC) 15 MG tablet, Take 1 tablet (15 mg total) by mouth daily., Disp: 180 tablet, Rfl: 0 .  sertraline (ZOLOFT) 25 MG tablet, , Disp: , Rfl:  .  tiZANidine (ZANAFLEX) 2 MG tablet, Take 1 tablet (2 mg total) by mouth every 8 (eight) hours as needed for muscle spasms., Disp: 90 tablet, Rfl: 5 .  traZODone (DESYREL) 50 MG tablet, Take 50-100 mg by mouth at bedtime as needed., Disp: , Rfl:  .  CHANTIX CONTINUING MONTH PAK 1 MG tablet, TAKE 1 TABLET BY MOUTH TWICE A DAY (Patient not taking: Reported on 04/05/2019), Disp: 56 tablet, Rfl: 3 .  metroNIDAZOLE (METROCREAM) 0.75 % cream, APPLY A THIN LAYER TO FACE TWICE A DAY, Disp: , Rfl:  .  mupirocin ointment (BACTROBAN) 2 %, Place 1 application into the nose 2 (two) times daily. (Patient not taking: Reported on 04/05/2019), Disp: 22 g, Rfl: 0  No Known Allergies  I personally reviewed active problem list, medication list, allergies, notes from last encounter, lab results with the patient/caregiver today.  ROS  Ten systems reviewed and is negative except as mentioned in HPI  Objective  Virtual encounter, vitals not obtained.  There is no height or weight on file to calculate BMI.  Nursing Note and Vital Signs reviewed.  Physical Exam  Constitutional: Patient appears well-developed and well-nourished. No  distress.  HENT: Head: Normocephalic and atraumatic.  Neck: Normal range of motion. Pulmonary/Chest: Effort normal. No respiratory distress. Speaking in complete sentences Neurological: Pt is alert and oriented to person, place, and time. Coordination, speech and gait are normal.  Psychiatric: Patient has a normal mood and affect. behavior is normal. Judgment and thought content normal.  No results found for this or any previous visit (from the past 72 hour(s)).  Assessment & Plan  1. GERD without esophagitis - Work on ID-ing and avoiding triggers, bland diet, omeprazole BID for now; she will come in today prior to starting omeprazole for H. Pylori testing.  Due to her upcoming colonoscopy, we will check back in 1 week to ensure she is seeing some improvement.  If not improving, I will reach out to Dr. Vicente Males for eval/consideration of upper endoscopy as well.  - omeprazole (PRILOSEC) 20 MG capsule; Take 1 capsule (20 mg total) by mouth 2 (two) times daily before a meal.  Dispense: 60 capsule; Refill: 1  2. Epigastric pain - H. pylori breath test  -Red flags and when  to present for emergency care or RTC including fever >101.60F, chest pain, shortness of breath, new/worsening/un-resolving symptoms, reviewed with patient at time of visit. Follow up and care instructions discussed and provided in AVS. - I discussed the assessment and treatment plan with the patient. The patient was provided an opportunity to ask questions and all were answered. The patient agreed with the plan and demonstrated an understanding of the instructions.  I provided 16 minutes of non-face-to-face time during this encounter.  Hubbard Hartshorn, FNP

## 2019-04-06 LAB — H. PYLORI BREATH TEST: H. pylori Breath Test: DETECTED — AB

## 2019-04-07 ENCOUNTER — Other Ambulatory Visit: Payer: Self-pay | Admitting: Family Medicine

## 2019-04-07 DIAGNOSIS — A048 Other specified bacterial intestinal infections: Secondary | ICD-10-CM

## 2019-04-07 MED ORDER — METRONIDAZOLE 250 MG PO TABS: 250.0000 mg | ORAL_TABLET | Freq: Four times a day (QID) | ORAL | 0 refills | Status: AC

## 2019-04-07 MED ORDER — TETRACYCLINE HCL 500 MG PO CAPS: 500.0000 mg | ORAL_CAPSULE | Freq: Four times a day (QID) | ORAL | 0 refills | Status: AC

## 2019-04-07 MED ORDER — BISMUTH SUBSALICYLATE 262 MG PO TABS: 1.0000 | ORAL_TABLET | Freq: Four times a day (QID) | ORAL | 0 refills | Status: AC

## 2019-04-07 NOTE — Telephone Encounter (Signed)
Error

## 2019-04-09 ENCOUNTER — Ambulatory Visit: Payer: Self-pay | Admitting: *Deleted

## 2019-04-09 NOTE — Telephone Encounter (Signed)
Patient was getting out of the shower and had dizziness with dark spots. She had another spell when she went outside and bent over, and she felt like she was unsteady on her feet. Patient states she has had a heavy flow recently- 7/17 patient has been bleeding rather heavily. Patient is a cancer patient and she takes a lot of medications. Patient is on new medication for gastritis- she thinks. Call to office- due to patient seeing black spots with her dizziness- office advises patient to go to Ed. Patient advised and will go.  Reason for Disposition . [1] MODERATE dizziness (e.g., interferes with normal activities) AND [2] has NOT been evaluated by physician for this  (Exception: dizziness caused by heat exposure, sudden standing, or poor fluid intake)  Answer Assessment - Initial Assessment Questions 1. DESCRIPTION: "Describe your dizziness."     Feels like she is in daze- walking funny 2. LIGHTHEADED: "Do you feel lightheaded?" (e.g., somewhat faint, woozy, weak upon standing)     Somewhat faint earlier- wobbly  3. VERTIGO: "Do you feel like either you or the room is spinning or tilting?" (i.e. vertigo)     no 4. SEVERITY: "How bad is it?"  "Do you feel like you are going to faint?" "Can you stand and walk?"   - MILD - walking normally   - MODERATE - interferes with normal activities (e.g., work, school)    - SEVERE - unable to stand, requires support to walk, feels like passing out now.      moderate 5. ONSET:  "When did the dizziness begin?"     This morning- 2 hours after taking all mediaction 6. AGGRAVATING FACTORS: "Does anything make it worse?" (e.g., standing, change in head position)     Standing- moving 7. HEART RATE: "Can you tell me your heart rate?" "How many beats in 15 seconds?"  (Note: not all patients can do this)       Rapid after coming in from outside- slowed down now 8. CAUSE: "What do you think is causing the dizziness?"     Unknown- possible medication 9. RECURRENT  SYMPTOM: "Have you had dizziness before?" If so, ask: "When was the last time?" "What happened that time?"     never 10. OTHER SYMPTOMS: "Do you have any other symptoms?" (e.g., fever, chest pain, vomiting, diarrhea, bleeding)       Just finished heavy cycle 11. PREGNANCY: "Is there any chance you are pregnant?" "When was your last menstrual period?"       N/a- BTL  Protocols used: DIZZINESS Heidi Dach

## 2019-04-12 ENCOUNTER — Encounter: Payer: Self-pay | Admitting: Family Medicine

## 2019-04-12 ENCOUNTER — Ambulatory Visit (INDEPENDENT_AMBULATORY_CARE_PROVIDER_SITE_OTHER): Payer: BC Managed Care – PPO | Admitting: Family Medicine

## 2019-04-12 ENCOUNTER — Other Ambulatory Visit: Payer: Self-pay

## 2019-04-12 ENCOUNTER — Other Ambulatory Visit: Payer: Self-pay | Admitting: Oncology

## 2019-04-12 DIAGNOSIS — R1013 Epigastric pain: Secondary | ICD-10-CM

## 2019-04-12 DIAGNOSIS — R197 Diarrhea, unspecified: Secondary | ICD-10-CM

## 2019-04-12 DIAGNOSIS — A048 Other specified bacterial intestinal infections: Secondary | ICD-10-CM

## 2019-04-12 DIAGNOSIS — J438 Other emphysema: Secondary | ICD-10-CM | POA: Diagnosis not present

## 2019-04-12 DIAGNOSIS — C9110 Chronic lymphocytic leukemia of B-cell type not having achieved remission: Secondary | ICD-10-CM

## 2019-04-12 MED ORDER — ALBUTEROL SULFATE HFA 108 (90 BASE) MCG/ACT IN AERS: 2.0000 | INHALATION_SPRAY | Freq: Four times a day (QID) | RESPIRATORY_TRACT | 3 refills | Status: DC | PRN

## 2019-04-12 NOTE — Progress Notes (Signed)
Name: Monique [name]   MRN: [id number]    DOB: 06/24/1971   Date:04/12/2019       Progress Note  Subjective  Chief Complaint  Chief Complaint  Patient presents with  . Follow-up  . gastritis  . Gastroesophageal Reflux    I connected with  Monique [name]  on 04/12/19 at 11:20 AM EDT by a video enabled telemedicine application and verified that I am speaking with the correct person using two identifiers.  I discussed the limitations of evaluation and management by telemedicine and the availability of in person appointments. The patient expressed understanding and agreed to proceed. Staff also discussed with the patient that there may be a patient responsible charge related to this service. Patient Location: Home Provider Location: Office Additional Individuals present: None  HPI  Monique [name] presents to follow up on reflux symptoms and H. Pylori.  She is about 5 days into her treatment - we chose quadruple therapy (metronidazole, tetracycline as clindamycin interferes with her chemotherapy.  She notes today she is having diarrhea - 6 episodes today.  She used to struggle with constipation - does have colonoscopy with Monique [name] in about a week.  She did call in the other day complaining of lightheadedness with spots - she was told to go to UC which she did not do - the symptoms dissipated on her own.   Emphyema:Her breathing has been a bit worse lately - she notes some shortness of breath yesterday - carrying her grandson and his diaper bag.  She does have mild emphysema noted on last CT chest in February. Using albuterol PRN, but forgot to use it yesterday.  She does have upcoming CT chest (along with several other CT scans per oncology management) - we will await these results to determine if worsening shortness of breath is related to a change in pulmonary nodules/any new masses vs emphysema.  Patient Active Problem List   Diagnosis Date Noted  . Trigeminal neuralgia 10/05/2018  .  Lumbar radiculitis (Right) 09/24/2018  . Hypocalcemia 06/03/2018  . Vitamin D insufficiency 06/03/2018  . Abnormal MRI, lumbar spine (04/20/2017) 06/03/2018  . Chronic hip pain (Right) 06/03/2018  . Spondylosis without myelopathy or radiculopathy, lumbar region 06/03/2018  . DDD (degenerative disc disease), lumbar 06/02/2018  . Lumbar facet hypertrophy 06/02/2018  . Lumbar facet arthropathy 06/02/2018  . Lumbar facet syndrome (Bilateral) (R>L) 06/02/2018  . Marijuana use 05/19/2018  . Chronic lower extremity pain (Primary Area of Pain) (Bilateral) (R>L) 05/13/2018  . Chronic low back pain (Secondary Area of Pain) (Bilateral) (R>L) w/ sciatica (Bilateral) 05/13/2018  . Chronic pain syndrome 05/13/2018  . Opiate use 05/13/2018  . Disorder of skeletal system 05/13/2018  . Pharmacologic therapy 05/13/2018  . Problems influencing health status 05/13/2018  . Mastalgia 05/05/2018  . Breast mass, right 01/07/2018  . Facial pain 12/11/2017  . Tingling 12/11/2017  . Thoracic aortic atherosclerosis ([place]) 08/20/2017  . Emphysema of lung ([place]) 08/20/2017  . Adductor tendinitis 04/23/2017  . Trochanteric bursitis of right hip 04/23/2017  . GERD without esophagitis 12/11/2016  . CLL (chronic lymphocytic leukemia) ([place]) 11/24/2016  . Pap smear abnormality of cervix/human papillomavirus (HPV) positive 09/12/2016  . Stress incontinence 09/04/2016  . B12 deficiency 07/10/2015  . Insomnia, persistent 07/10/2015  . Anorexia nervosa, restricting type 07/10/2015  . Anxiety, generalized 07/10/2015  . H/O suicide attempt 07/10/2015  . Lymphocytosis 07/10/2015  . Obsessive-compulsive disorder 07/10/2015  . Tobacco use 07/10/2015  . History of cervical dysplasia 07/18/2014  Social History   Tobacco Use  . Smoking status: Current Every Day Smoker    Packs/day: 0.50    Years: 31.00    Pack years: 15.50    Types: Cigarettes    Start date: 09/25/1986  . Smokeless tobacco: Never Used  . Tobacco  comment: taking chantix again and is cutting down on amount   Substance Use Topics  . Alcohol use: Yes    Alcohol/week: 0.0 standard drinks    Comment: rarely     Current Outpatient Medications:  .  Bismuth Subsalicylate 694 MG TABS, Take 1 tablet (262 mg total) by mouth 4 (four) times daily for 14 days., Disp: 56 tablet, Rfl: 0 .  Calcium Carbonate-Vit D-Min (GNP CALCIUM 1200) 1200-1000 MG-UNIT CHEW, Chew 1,200 mg by mouth daily with breakfast. Take in combination with vitamin D and magnesium., Disp: 30 tablet, Rfl: 5 .  Fluticasone-Umeclidin-Vilant (TRELEGY ELLIPTA) 100-62.5-25 MCG/INH AEPB, Inhale 1 puff into the lungs daily., Disp: 1 each, Rfl: 3 .  gabapentin (NEURONTIN) 100 MG capsule, Take 100 mg by mouth 2 (two) times daily. , Disp: , Rfl:  .  IMBRUVICA 420 MG TABS, TAKE 1 TABLET BY MOUTH DAILY, Disp: 28 tablet, Rfl: 2 .  lamoTRIgine (LAMICTAL) 100 MG tablet, Take 1 tablet (100 mg total) by mouth 2 (two) times daily. ., Disp: 180 tablet, Rfl: 1 .  linaclotide (LINZESS) 72 MCG capsule, Take 1 capsule (72 mcg total) by mouth daily before breakfast., Disp: 90 capsule, Rfl: 0 .  Magnesium 500 MG CAPS, Take 1 capsule (500 mg total) by mouth 2 (two) times daily at 8 am and 10 pm., Disp: 60 capsule, Rfl: 5 .  meloxicam (MOBIC) 15 MG tablet, Take 1 tablet (15 mg total) by mouth daily., Disp: 180 tablet, Rfl: 0 .  metroNIDAZOLE (FLAGYL) 250 MG tablet, Take 1 tablet (250 mg total) by mouth 4 (four) times daily for 14 days., Disp: 56 tablet, Rfl: 0 .  mupirocin ointment (BACTROBAN) 2 %, Place 1 application into the nose 2 (two) times daily., Disp: 22 g, Rfl: 0 .  omeprazole (PRILOSEC) 20 MG capsule, Take 1 capsule (20 mg total) by mouth 2 (two) times daily before a meal., Disp: 60 capsule, Rfl: 1 .  sertraline (ZOLOFT) 25 MG tablet, , Disp: , Rfl:  .  tetracycline (SUMYCIN) 500 MG capsule, Take 1 capsule (500 mg total) by mouth 4 (four) times daily for 14 days., Disp: 56 capsule, Rfl: 0 .   tiZANidine (ZANAFLEX) 2 MG tablet, Take 1 tablet (2 mg total) by mouth every 8 (eight) hours as needed for muscle spasms., Disp: 90 tablet, Rfl: 5 .  traZODone (DESYREL) 50 MG tablet, Take 50-100 mg by mouth at bedtime as needed., Disp: , Rfl:  .  CHANTIX CONTINUING MONTH PAK 1 MG tablet, TAKE 1 TABLET BY MOUTH TWICE A DAY (Patient not taking: Reported on 04/05/2019), Disp: 56 tablet, Rfl: 3 .  metroNIDAZOLE (METROCREAM) 0.75 % cream, APPLY A THIN LAYER TO FACE TWICE A DAY, Disp: , Rfl:   No Known Allergies  I personally reviewed active problem list, medication list, allergies, notes from last encounter, lab results with the patient/caregiver today.  ROS  Constitutional: Negative for fever or weight change.  Respiratory: Negative for cough and shortness of breath.   Cardiovascular: Negative for chest pain or palpitations.  Gastrointestinal: Negative for abdominal pain, no bowel changes.  Musculoskeletal: Negative for gait problem or joint swelling.  Skin: Negative for rash.  Neurological: Negative for dizziness or  headache.  No other specific complaints in a complete review of systems (except as listed in HPI above).  Objective  Virtual encounter, vitals not obtained.  There is no height or weight on file to calculate BMI.  Nursing Note and Vital Signs reviewed.  Physical Exam  Constitutional: Patient appears well-developed and well-nourished. No distress.  HENT: Head: Normocephalic and atraumatic.  Neck: Normal range of motion. Pulmonary/Chest: Effort normal. No respiratory distress. Speaking in complete sentences Neurological: Pt is alert and oriented to person, place, and time. Coordination, speech and gait are normal.  Psychiatric: Patient has a normal mood and affect. behavior is normal. Judgment and thought content normal. Abdominal: She endorses bloating and very mild tenderness to the epigastric area only.   No results found for this or any previous visit (from the past  72 hour(s)).  Assessment & Plan  1. H. pylori infection - Continue quadruple therapy  2. Epigastric pain - Likely secondary to H. Pylori infection; will recheck h. Pylori breath testing in 3 weeks as ordered, and if negative but still symptomatic, will refer to GI.  3. Other emphysema ([place]) - Continue trelegy, try using albuterol as needed for shortness of breath. - albuterol (VENTOLIN HFA) 108 (90 Base) MCG/ACT inhaler; Inhale 2 puffs into the lungs every 6 (six) hours as needed for wheezing or shortness of breath.  Dispense: 18 g; Refill: 3  4. Diarrhea, unspecified type - Advised probiotic initiation, bland diet, and drinking plenty of fluids.  She is taking peptobismol with H. Pylori infection. Follow up in 2 days if not improving.  -Red flags and when to present for emergency care or RTC including fever >101.37F, chest pain, shortness of breath, new/worsening/un-resolving symptoms, reviewed with patient at time of visit. Follow up and care instructions discussed and provided in AVS. - I discussed the assessment and treatment plan with the patient. The patient was provided an opportunity to ask questions and all were answered. The patient agreed with the plan and demonstrated an understanding of the instructions.  I provided 23 minutes of non-face-to-face time during this encounter.  Hubbard Hartshorn, FNP

## 2019-04-14 ENCOUNTER — Encounter: Payer: Self-pay | Admitting: Family Medicine

## 2019-04-14 DIAGNOSIS — R197 Diarrhea, unspecified: Secondary | ICD-10-CM

## 2019-04-15 DIAGNOSIS — R197 Diarrhea, unspecified: Secondary | ICD-10-CM | POA: Diagnosis not present

## 2019-04-16 ENCOUNTER — Other Ambulatory Visit: Payer: Self-pay

## 2019-04-16 ENCOUNTER — Other Ambulatory Visit
Admission: RE | Admit: 2019-04-16 | Discharge: 2019-04-16 | Disposition: A | Discharge status: Home / Self Care | Payer: BC Managed Care – PPO | Source: Ambulatory Visit | Attending: Gastroenterology | Admitting: Gastroenterology

## 2019-04-16 ENCOUNTER — Other Ambulatory Visit: Payer: Self-pay | Admitting: Family Medicine

## 2019-04-16 DIAGNOSIS — Z01812 Encounter for preprocedural laboratory examination: Secondary | ICD-10-CM | POA: Diagnosis not present

## 2019-04-16 DIAGNOSIS — Z20828 Contact with and (suspected) exposure to other viral communicable diseases: Secondary | ICD-10-CM | POA: Diagnosis not present

## 2019-04-16 DIAGNOSIS — G500 Trigeminal neuralgia: Secondary | ICD-10-CM

## 2019-04-16 NOTE — Telephone Encounter (Signed)
Refill request for general medication. Lamictal   Last office visit 01/25/2019   Follow up on 04/28/2019

## 2019-04-17 LAB — SARS CORONAVIRUS 2 (TAT 6-24 HRS): SARS Coronavirus 2: NEGATIVE

## 2019-04-19 ENCOUNTER — Encounter: Payer: Self-pay | Admitting: Family Medicine

## 2019-04-21 ENCOUNTER — Encounter
Admission: RE | Disposition: A | Discharge status: Home / Self Care | Payer: Self-pay | Source: Home / Self Care | Attending: Gastroenterology

## 2019-04-21 ENCOUNTER — Ambulatory Visit: Payer: BC Managed Care – PPO | Admitting: Certified Registered"

## 2019-04-21 ENCOUNTER — Ambulatory Visit
Admission: RE | Admit: 2019-04-21 | Discharge: 2019-04-21 | Disposition: A | Discharge status: Home / Self Care | Payer: BC Managed Care – PPO | Attending: Gastroenterology | Admitting: Gastroenterology

## 2019-04-21 DIAGNOSIS — D125 Benign neoplasm of sigmoid colon: Secondary | ICD-10-CM

## 2019-04-21 DIAGNOSIS — Z791 Long term (current) use of non-steroidal anti-inflammatories (NSAID): Secondary | ICD-10-CM | POA: Diagnosis not present

## 2019-04-21 DIAGNOSIS — F419 Anxiety disorder, unspecified: Secondary | ICD-10-CM | POA: Diagnosis not present

## 2019-04-21 DIAGNOSIS — F329 Major depressive disorder, single episode, unspecified: Secondary | ICD-10-CM | POA: Insufficient documentation

## 2019-04-21 DIAGNOSIS — Z1211 Encounter for screening for malignant neoplasm of colon: Secondary | ICD-10-CM

## 2019-04-21 DIAGNOSIS — K635 Polyp of colon: Secondary | ICD-10-CM

## 2019-04-21 DIAGNOSIS — Z79899 Other long term (current) drug therapy: Secondary | ICD-10-CM | POA: Insufficient documentation

## 2019-04-21 DIAGNOSIS — K648 Other hemorrhoids: Secondary | ICD-10-CM | POA: Insufficient documentation

## 2019-04-21 DIAGNOSIS — Z818 Family history of other mental and behavioral disorders: Secondary | ICD-10-CM | POA: Diagnosis not present

## 2019-04-21 DIAGNOSIS — K649 Unspecified hemorrhoids: Secondary | ICD-10-CM | POA: Diagnosis not present

## 2019-04-21 DIAGNOSIS — Z808 Family history of malignant neoplasm of other organs or systems: Secondary | ICD-10-CM | POA: Insufficient documentation

## 2019-04-21 DIAGNOSIS — Z8541 Personal history of malignant neoplasm of cervix uteri: Secondary | ICD-10-CM | POA: Insufficient documentation

## 2019-04-21 DIAGNOSIS — G4700 Insomnia, unspecified: Secondary | ICD-10-CM | POA: Diagnosis not present

## 2019-04-21 DIAGNOSIS — R194 Change in bowel habit: Secondary | ICD-10-CM | POA: Insufficient documentation

## 2019-04-21 DIAGNOSIS — F17210 Nicotine dependence, cigarettes, uncomplicated: Secondary | ICD-10-CM | POA: Insufficient documentation

## 2019-04-21 DIAGNOSIS — D128 Benign neoplasm of rectum: Secondary | ICD-10-CM | POA: Diagnosis not present

## 2019-04-21 HISTORY — PX: COLONOSCOPY WITH PROPOFOL: SHX5780

## 2019-04-21 LAB — GASTROINTESTINAL PATHOGEN PANEL PCR
C. difficile Tox A/B, PCR: NOT DETECTED
Campylobacter, PCR: NOT DETECTED
Cryptosporidium, PCR: NOT DETECTED
E coli (ETEC) LT/ST PCR: NOT DETECTED
E coli (STEC) stx1/stx2, PCR: NOT DETECTED
E coli 0157, PCR: NOT DETECTED
Giardia lamblia, PCR: NOT DETECTED
Norovirus, PCR: NOT DETECTED
Rotavirus A, PCR: NOT DETECTED
Salmonella, PCR: NOT DETECTED
Shigella, PCR: NOT DETECTED

## 2019-04-21 SURGERY — COLONOSCOPY WITH PROPOFOL
Anesthesia: General

## 2019-04-21 MED ORDER — LIDOCAINE HCL (PF) 1 % IJ SOLN
2.0000 mL | Freq: Once | INTRAMUSCULAR | Status: AC
  Administered 2019-04-21: 0.3 mL via INTRADERMAL

## 2019-04-21 MED ORDER — PROPOFOL 10 MG/ML IV BOLUS
INTRAVENOUS | Status: AC
  Filled 2019-04-21: qty 20

## 2019-04-21 MED ORDER — PROPOFOL 10 MG/ML IV BOLUS
INTRAVENOUS | Status: DC | PRN
  Administered 2019-04-21 (×4): 20 mg via INTRAVENOUS

## 2019-04-21 MED ORDER — SODIUM CHLORIDE 0.9 % IV SOLN
INTRAVENOUS | Status: DC
  Administered 2019-04-21: 1000 mL via INTRAVENOUS
  Administered 2019-04-21: 13:00:00 via INTRAVENOUS

## 2019-04-21 MED ORDER — LIDOCAINE HCL (PF) 1 % IJ SOLN
INTRAMUSCULAR | Status: AC
  Administered 2019-04-21: 0.3 mL via INTRADERMAL
  Filled 2019-04-21: qty 2

## 2019-04-21 MED ORDER — PROPOFOL 500 MG/50ML IV EMUL
INTRAVENOUS | Status: DC | PRN
  Administered 2019-04-21: 150 ug/kg/min via INTRAVENOUS

## 2019-04-21 MED ORDER — PROPOFOL 500 MG/50ML IV EMUL
INTRAVENOUS | Status: AC
  Filled 2019-04-21: qty 50

## 2019-04-21 MED FILL — IMBRUVICA 420 MG TAB: 420 | 28 days supply | Qty: 28 | Fill #0

## 2019-04-21 NOTE — Op Note (Signed)
[facility] Patient Name: Raneshia Derick Procedure Date: 04/21/2019 12:28 PM MRN: [id number] Account #: [id number] Date of Birth: 08/04/1971 Admit Type: Outpatient Age: 48 Room: [facility] ENDO ROOM 4 Gender: Female Note Status: Finalized Procedure:            Colonoscopy Indications:          Screening for colorectal malignant neoplasm Providers:            Bobbette Eakes B. Bonna Gains MD, MD Referring MD:         Bethena Roys. Sowles, MD (Referring MD) Medicines:            Monitored Anesthesia Care Complications:        No immediate complications. Procedure:            Pre-Anesthesia Assessment:                       - ASA Grade Assessment: II - A patient with mild                        systemic disease.                       - Prior to the procedure, a History and Physical was                        performed, and patient medications, allergies and                        sensitivities were reviewed. The patient's tolerance of                        previous anesthesia was reviewed.                       - The risks and benefits of the procedure and the                        sedation options and risks were discussed with the                        patient. All questions were answered and informed                        consent was obtained.                       - Patient identification and proposed procedure were                        verified prior to the procedure by the physician, the                        nurse, the anesthesiologist, the anesthetist and the                        technician. The procedure was verified in the procedure                        room.  After obtaining informed consent, the colonoscope was                        passed under direct vision. Throughout the procedure,                        the patient's blood pressure, pulse, and oxygen                        saturations were monitored continuously. The                      Colonoscope was introduced through the anus and                        advanced to the the cecum, identified by appendiceal                        orifice and ileocecal valve. The colonoscopy was                        performed with ease. The patient tolerated the                        procedure well. The quality of the bowel preparation                        was good. Findings:      The perianal and digital rectal examinations were normal.      Five sessile polyps were found in the sigmoid colon. The polyps were 3       to 4 mm in size. These polyps were removed with a cold biopsy forceps.       Resection and retrieval were complete. A few other smaller hyperplastic       appearing polyps were present the sigmoid colon as well. The largest       ones were removed as above.      The exam was otherwise without abnormality.      The rectum, sigmoid colon, descending colon, transverse colon, ascending       colon and cecum appeared normal.      Non-bleeding internal hemorrhoids were found during retroflexion. Impression:           - Five 3 to 4 mm polyps in the sigmoid colon, removed                        with a cold biopsy forceps. Resected and retrieved.                       - The examination was otherwise normal.                       - The rectum, sigmoid colon, descending colon,                        transverse colon, ascending colon and cecum are normal.                       - Non-bleeding internal hemorrhoids. Recommendation:       - Discharge patient to home (with escort).                       -  Advance diet as tolerated.                       - Continue present medications.                       - Await pathology results.                       - Repeat colonoscopy in 5 years.                       - The findings and recommendations were discussed with                        the patient.                       - The findings and recommendations were  discussed with                        the patient's family.                       - Return to primary care physician as previously                        scheduled.                       - High fiber diet. Procedure Code(s):    --- Professional ---                       [phone], Colonoscopy, flexible; with biopsy, single or                        multiple Diagnosis Code(s):    --- Professional ---                       Z12.11, Encounter for screening for malignant neoplasm                        of colon                       K63.5, Polyp of colon CPT copyright 2019 American Medical Association. All rights reserved. The codes documented in this report are preliminary and upon coder review may  be revised to meet current compliance requirements.  Vonda Antigua, MD Margretta Sidle B. Bonna Gains MD, MD 04/21/2019 1:14:29 PM This report has been signed electronically. Number of Addenda: 0 Note Initiated On: 04/21/2019 12:28 PM Scope Withdrawal Time: 0 hours 26 minutes 7 seconds  Total Procedure Duration: 0 hours 30 minutes 49 seconds  Estimated Blood Loss: Estimated blood loss: none.      [facility]

## 2019-04-21 NOTE — Anesthesia Preprocedure Evaluation (Signed)
Anesthesia Evaluation  Patient identified by MRN, date of birth, ID band Patient awake    Reviewed: Allergy & Precautions, NPO status , Patient's Chart, lab work & pertinent test results  History of Anesthesia Complications Negative for: history of anesthetic complications  Airway Mallampati: II       Dental   Pulmonary neg sleep apnea, COPD,  COPD inhaler, Current Smoker,           Cardiovascular (-) hypertension(-) Past MI and (-) CHF (-) dysrhythmias (-) Valvular Problems/Murmurs     Neuro/Psych neg Seizures Anxiety Depression    GI/Hepatic Neg liver ROS, GERD  Medicated,  Endo/Other  neg diabetes  Renal/GU negative Renal ROS     Musculoskeletal   Abdominal   Peds  Hematology   Anesthesia Other Findings   Reproductive/Obstetrics                             Anesthesia Physical Anesthesia Plan  ASA: II  Anesthesia Plan: General   Post-op Pain Management:    Induction: Intravenous  PONV Risk Score and Plan: 2 and Propofol infusion and TIVA  Airway Management Planned: Nasal Cannula  Additional Equipment:   Intra-op Plan:   Post-operative Plan:   Informed Consent: I have reviewed the patients History and Physical, chart, labs and discussed the procedure including the risks, benefits and alternatives for the proposed anesthesia with the patient or authorized representative who has indicated his/her understanding and acceptance.       Plan Discussed with:   Anesthesia Plan Comments:         Anesthesia Quick Evaluation

## 2019-04-21 NOTE — Transfer of Care (Signed)
Immediate Anesthesia Transfer of Care Note  Patient: Monique [name]  Procedure(s) Performed: COLONOSCOPY WITH PROPOFOL (N/A )  Patient Location: PACU and Endoscopy Unit  Anesthesia Type:MAC  Level of Consciousness: awake  Airway & Oxygen Therapy: Patient Spontanous Breathing  Post-op Assessment: Report given to RN  Post vital signs: Reviewed  Last Vitals:  Vitals Value Taken Time  BP 106/57 04/21/19 1311  Temp 36.2 C 04/21/19 1311  Pulse 74 04/21/19 1311  Resp 21 04/21/19 1311  SpO2 100 % 04/21/19 1311    Last Pain:  Vitals:   04/21/19 1311  TempSrc: Tympanic  PainSc: 0-No pain         Complications: No apparent anesthesia complications

## 2019-04-21 NOTE — Anesthesia Post-op Follow-up Note (Signed)
Anesthesia QCDR form completed.        

## 2019-04-21 NOTE — H&P (Signed)
Vonda Antigua, MD [address], [place], [place], [place], [address] [address], [place], [place], [place], [address] Phone: [phone]  Fax: [phone]  Primary Care Physician:  [name], MD   Pre-Procedure History & Physical: HPI:  Monique [name] is a 48 y.o. female is here for a colonoscopy.   Past Medical History:  Diagnosis Date  . Anxiety   . Bursitis    leg pain  . Cervical cancer ([place])    hx LEEP over 18 years ago.   . Cervical intraepithelial neoplasia I   . Chronic lymphocytic leukemia ([place]) 2018   Dr Grayland Ormond  . Depression   . Eating disorder   . History of self-harm   . Insomnia   . Obsession   . Pap smear abnormality of cervix with LGSIL   . Tobacco abuse   . Vitamin B12 deficiency (non anemic)     Past Surgical History:  Procedure Laterality Date  . BREAST BIOPSY Right 01/05/2018   US guided biopsy of 2 areas and 1 lymph node, MIXED INFLAMMATION AND GIANT CELL REACTION  . CERVICAL BIOPSY  W/ LOOP ELECTRODE EXCISION    . OTHER SURGICAL HISTORY     scar tissue removed from vocal cords  . TUBAL LIGATION      Prior to Admission medications   Medication Sig Start Date End Date Taking? Authorizing Provider  albuterol (VENTOLIN HFA) 108 (90 Base) MCG/ACT inhaler Inhale 2 puffs into the lungs every 6 (six) hours as needed for wheezing or shortness of breath. 04/12/19  Yes Hubbard Hartshorn, FNP  Bismuth Subsalicylate 025 MG TABS Take 1 tablet (262 mg total) by mouth 4 (four) times daily for 14 days. 04/07/19 04/21/19  Hubbard Hartshorn, FNP  Calcium Carbonate-Vit D-Min (GNP CALCIUM 1200) 1200-1000 MG-UNIT CHEW Chew 1,200 mg by mouth daily with breakfast. Take in combination with vitamin D and magnesium. 12/30/18 06/28/19  Vevelyn Francois, NP  CHANTIX CONTINUING MONTH PAK 1 MG tablet TAKE 1 TABLET BY MOUTH TWICE A DAY Patient not taking: Reported on 04/05/2019 02/02/19   Lloyd Huger, MD  Fluticasone-Umeclidin-Vilant (TRELEGY ELLIPTA)  100-62.5-25 MCG/INH AEPB Inhale 1 puff into the lungs daily. 01/29/19   [name], MD  gabapentin (NEURONTIN) 100 MG capsule Take 100 mg by mouth 2 (two) times daily.  12/09/17   [provider]  IMBRUVICA 420 MG TABS TAKE 1 TABLET BY MOUTH DAILY 04/12/19   Lloyd Huger, MD  lamoTRIgine (LAMICTAL) 100 MG tablet Take 1 tablet (100 mg total) by mouth 2 (two) times daily. . 10/27/18   [name], MD  linaclotide Rolan Lipa) 72 MCG capsule Take 1 capsule (72 mcg total) by mouth daily before breakfast. 03/09/19   Jonathon Bellows, MD  Magnesium 500 MG CAPS Take 1 capsule (500 mg total) by mouth 2 (two) times daily at 8 am and 10 pm. 12/30/18 06/28/19  Vevelyn Francois, NP  meloxicam (MOBIC) 15 MG tablet Take 1 tablet (15 mg total) by mouth daily. 12/30/18 06/28/19  Vevelyn Francois, NP  metroNIDAZOLE (FLAGYL) 250 MG tablet Take 1 tablet (250 mg total) by mouth 4 (four) times daily for 14 days. 04/07/19 04/21/19  Hubbard Hartshorn, FNP  metroNIDAZOLE (METROCREAM) 0.75 % cream APPLY A THIN LAYER TO FACE TWICE A DAY 03/03/19   [provider]  mupirocin ointment (BACTROBAN) 2 % Place 1 application into the nose 2 (two) times daily. 01/26/19   [name], MD  omeprazole (PRILOSEC) 20 MG capsule Take  1 capsule (20 mg total) by mouth 2 (two) times daily before a meal. 04/05/19   Hubbard Hartshorn, FNP  sertraline (ZOLOFT) 25 MG tablet  01/06/19   [provider]  tetracycline (SUMYCIN) 500 MG capsule Take 1 capsule (500 mg total) by mouth 4 (four) times daily for 14 days. 04/07/19 04/21/19  Hubbard Hartshorn, FNP  tiZANidine (ZANAFLEX) 2 MG tablet Take 1 tablet (2 mg total) by mouth every 8 (eight) hours as needed for muscle spasms. 12/30/18 06/28/19  Vevelyn Francois, NP  traZODone (DESYREL) 50 MG tablet Take 50-100 mg by mouth at bedtime as needed. 12/30/18   [provider]    Allergies as of 03/09/2019  . (No Known Allergies)    Family History  Problem Relation Age of Onset  .  Cancer Mother        thyroid  . Alcohol abuse Father   . Alcohol abuse Brother   . Depression Brother   . Bipolar disorder Brother   . ADD / ADHD Son   . Breast cancer Neg Hx     Social History   Socioeconomic History  . Marital status: Divorced    Spouse name: Not on file  . Number of children: 3  . Years of education: Not on file  . Highest education level: Not on file  Occupational History  . Occupation: Best boy: [employer]  . Financial resource strain: Somewhat hard  . Food insecurity    Worry: Never true    Inability: Never true  . Transportation needs    Medical: No    Non-medical: No  Tobacco Use  . Smoking status: Current Every Day Smoker    Packs/day: 0.50    Years: 31.00    Pack years: 15.50    Types: Cigarettes    Start date: 09/25/1986  . Smokeless tobacco: Never Used  . Tobacco comment: taking chantix again and is cutting down on amount   Substance and Sexual Activity  . Alcohol use: Yes    Alcohol/week: 0.0 standard drinks    Comment: rarely  . Drug use: Yes    Types: Marijuana  . Sexual activity: Yes    Partners: Male    Birth control/protection: None  Lifestyle  . Physical activity    Days per week: 5 days    Minutes per session: 30 min  . Stress: Not at all  Relationships  . Social connections    Talks on phone: More than three times a week    Gets together: Twice a week    Attends religious service: 1 to 4 times per year    Active member of club or organization: Yes    Attends meetings of clubs or organizations: 1 to 4 times per year    Relationship status: Divorced  . Intimate partner violence    Fear of current or ex partner: No    Emotionally abused: No    Physically abused: No    Forced sexual activity: No  Other Topics Concern  . Not on file  Social History Narrative  . Not on file    Review of Systems: See HPI, otherwise negative ROS  Physical Exam: BP (!) 110/46   Pulse 70   Temp (!) 96.2 F  (35.7 C) (Tympanic)   Resp 16   Ht 5\' 7"  (1.702 m)   Wt 72.6 kg   LMP 04/11/2019 Comment: negative urine preg  SpO2 99%   BMI 25.06 kg/m  General:   Alert,  pleasant and cooperative in NAD Head:  Normocephalic and atraumatic. Neck:  Supple; no masses or thyromegaly. Lungs:  Clear throughout to auscultation, normal respiratory effort.    Heart:  +S1, +S2, Regular rate and rhythm, No edema. Abdomen:  Soft, nontender and nondistended. Normal bowel sounds, without guarding, and without rebound.   Neurologic:  Alert and  oriented x4;  grossly normal neurologically.  Impression/Plan: Monique [name] is here for a colonoscopy to be performed for average risk screening.  Risks, benefits, limitations, and alternatives regarding  colonoscopy have been reviewed with the patient.  Questions have been answered.  All parties agreeable.   Virgel Manifold, MD  04/21/2019, 12:25 PM

## 2019-04-22 ENCOUNTER — Encounter: Payer: Self-pay | Admitting: Gastroenterology

## 2019-04-22 NOTE — Anesthesia Postprocedure Evaluation (Signed)
Anesthesia Post Note  Patient: Chrisandra Wiemers  Procedure(s) Performed: COLONOSCOPY WITH PROPOFOL (N/A )  Patient location during evaluation: Endoscopy Anesthesia Type: General Level of consciousness: awake and alert Pain management: pain level controlled Vital Signs Assessment: post-procedure vital signs reviewed and stable Respiratory status: spontaneous breathing, nonlabored ventilation and respiratory function stable Cardiovascular status: blood pressure returned to baseline and stable Postop Assessment: no apparent nausea or vomiting Anesthetic complications: no     Last Vitals:  Vitals:   04/21/19 1331 04/21/19 1341  BP: 101/73 104/61  Pulse:    Resp:    Temp:    SpO2:      Last Pain:  Vitals:   04/21/19 1341  TempSrc:   PainSc: 0-No pain                 Alphonsus Sias

## 2019-04-23 LAB — SURGICAL PATHOLOGY

## 2019-04-28 ENCOUNTER — Encounter: Payer: Self-pay | Admitting: Family Medicine

## 2019-04-28 ENCOUNTER — Ambulatory Visit (INDEPENDENT_AMBULATORY_CARE_PROVIDER_SITE_OTHER): Payer: Medicaid Other | Admitting: Family Medicine

## 2019-04-28 ENCOUNTER — Other Ambulatory Visit: Payer: Self-pay

## 2019-04-28 VITALS — BP 120/76 | HR 81 | Temp 97.8°F | Resp 16 | Ht 67.0 in | Wt 161.1 lb

## 2019-04-28 DIAGNOSIS — F330 Major depressive disorder, recurrent, mild: Secondary | ICD-10-CM

## 2019-04-28 DIAGNOSIS — J439 Emphysema, unspecified: Secondary | ICD-10-CM

## 2019-04-28 DIAGNOSIS — K219 Gastro-esophageal reflux disease without esophagitis: Secondary | ICD-10-CM | POA: Diagnosis not present

## 2019-04-28 DIAGNOSIS — I700 Atherosclerosis of aorta: Secondary | ICD-10-CM

## 2019-04-28 DIAGNOSIS — G500 Trigeminal neuralgia: Secondary | ICD-10-CM

## 2019-04-28 DIAGNOSIS — L299 Pruritus, unspecified: Secondary | ICD-10-CM

## 2019-04-28 MED ORDER — TRELEGY ELLIPTA 100-62.5-25 MCG/INH IN AEPB: 1.0000 | INHALATION_SPRAY | Freq: Every day | RESPIRATORY_TRACT | 3 refills | Status: DC

## 2019-04-28 MED ORDER — HYDROXYZINE HCL 25 MG PO TABS: 25.0000 mg | ORAL_TABLET | Freq: Every day | ORAL | 2 refills | Status: DC

## 2019-04-28 MED ORDER — LORATADINE 10 MG PO TABS: 10.0000 mg | ORAL_TABLET | ORAL | 2 refills | Status: DC

## 2019-04-28 MED ORDER — TRIAMCINOLONE ACETONIDE 0.1 % EX CREA: 1.0000 "application " | TOPICAL_CREAM | Freq: Two times a day (BID) | CUTANEOUS | 0 refills | Status: DC

## 2019-04-28 MED ORDER — ATORVASTATIN CALCIUM 10 MG PO TABS: 10.0000 mg | ORAL_TABLET | Freq: Every day | ORAL | 1 refills | Status: DC
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
## 2019-04-29 ENCOUNTER — Other Ambulatory Visit: Payer: Self-pay

## 2019-04-29 ENCOUNTER — Other Ambulatory Visit: Payer: Self-pay | Admitting: Family Medicine

## 2019-04-29 DIAGNOSIS — C9110 Chronic lymphocytic leukemia of B-cell type not having achieved remission: Secondary | ICD-10-CM

## 2019-04-29 DIAGNOSIS — K219 Gastro-esophageal reflux disease without esophagitis: Secondary | ICD-10-CM

## 2019-04-29 NOTE — Progress Notes (Signed)
[place]  [phone] [phone]  ID: [name] OB: 1971/06/14  MR#: [id number]  JSR#:[id number]  Patient Care Team: [name], MD as PCP - General (Family Medicine) Lloyd Huger, MD as Consulting Physician (Oncology)  CHIEF COMPLAINT: CLL.  INTERVAL HISTORY: Patient returns to clinic today for further evaluation and discussion of her imaging results.  She continues to tolerate Imbruvica well only with some increased fatigue.  She otherwise feels well and is asymptomatic.  She has chronic back pain and is considering applying for disability.  She has no neurologic complaints.  She denies any fevers or night sweats.  She has good appetite and denies weight loss.  She has no chest pain, shortness of breath, cough, or hemoptysis.  She denies any nausea, vomiting, constipation, or diarrhea.  She has no melena or hematochezia.  She has no urinary complaints.  Patient offers no further specific complaints today.  REVIEW OF SYSTEMS:   Review of Systems  Constitutional: Positive for malaise/fatigue. Negative for diaphoresis, fever and weight loss.  HENT: Negative.  Negative for congestion.   Respiratory: Negative.  Negative for cough and shortness of breath.   Cardiovascular: Negative.  Negative for chest pain and leg swelling.  Gastrointestinal: Negative for abdominal pain and constipation.  Genitourinary: Negative.  Negative for dysuria and frequency.  Musculoskeletal: Positive for back pain. Negative for myalgias and neck pain.  Skin: Negative.   Neurological: Negative.  Negative for tingling, sensory change, focal weakness and weakness.  Psychiatric/Behavioral: Negative for depression and suicidal ideas. The patient is nervous/anxious.     As per HPI. Otherwise, a complete review of systems is negative.  PAST MEDICAL HISTORY: Past Medical History:  Diagnosis Date   Anxiety    Bursitis    leg pain   Cervical cancer ([place])     hx LEEP over 18 years ago.    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   Dr Grayland Ormond   Depression    Eating disorder    History of self-harm    Insomnia    Obsession    Pap smear abnormality of cervix with LGSIL    Tobacco abuse    Vitamin B12 deficiency (non anemic)     PAST SURGICAL HISTORY: Past Surgical History:  Procedure Laterality Date   BREAST BIOPSY Right 01/05/2018   US guided biopsy of 2 areas and 1 lymph node, MIXED INFLAMMATION AND GIANT CELL REACTION   CERVICAL BIOPSY  W/ LOOP ELECTRODE EXCISION     COLONOSCOPY WITH PROPOFOL N/A 04/21/2019   Procedure: COLONOSCOPY WITH PROPOFOL;  Surgeon: Virgel Manifold, MD;  Location: [facility] ENDOSCOPY;  Service: Gastroenterology;  Laterality: N/A;   OTHER SURGICAL HISTORY     scar tissue removed from vocal cords   TUBAL LIGATION      FAMILY HISTORY: Family History  Problem Relation Age of Onset   Cancer Mother        thyroid   Alcohol abuse Father    Alcohol abuse Brother    Depression Brother    Bipolar disorder Brother    ADD / ADHD Son    Breast cancer Neg Hx     ADVANCED DIRECTIVES (Y/N):  N  HEALTH MAINTENANCE: Social History   Tobacco Use   Smoking status: Current Every Day Smoker    Packs/day: 0.50    Years: 31.00    Pack years: 15.50    Types: Cigarettes    Start date: 09/25/1986  Smokeless tobacco: Never Used  Substance Use Topics   Alcohol use: Yes    Alcohol/week: 0.0 standard drinks    Comment: rarely   Drug use: Yes    Types: Marijuana     Colonoscopy:  PAP:  Bone density:  Lipid panel:  No Known Allergies  Current Outpatient Medications  Medication Sig Dispense Refill   albuterol (VENTOLIN HFA) 108 (90 Base) MCG/ACT inhaler Inhale 2 puffs into the lungs every 6 (six) hours as needed for wheezing or shortness of breath. 18 g 3   atorvastatin (LIPITOR) 10 MG tablet Take 1 tablet (10 mg total) by mouth daily. 90  tablet 1   Calcium Carbonate-Vit D-Min (GNP CALCIUM 1200) 1200-1000 MG-UNIT CHEW Chew 1,200 mg by mouth daily with breakfast. Take in combination with vitamin D and magnesium. 30 tablet 5   Fluticasone-Umeclidin-Vilant (TRELEGY ELLIPTA) 100-62.5-25 MCG/INH AEPB Inhale 1 puff into the lungs daily. 1 each 3   gabapentin (NEURONTIN) 100 MG capsule Take 100 mg by mouth 2 (two) times daily.      hydrOXYzine (ATARAX/VISTARIL) 25 MG tablet Take 1 tablet (25 mg total) by mouth at bedtime. 30 tablet 2   IMBRUVICA 420 MG TABS TAKE 1 TABLET BY MOUTH DAILY 28 tablet 2   lamoTRIgine (LAMICTAL) 100 MG tablet Take 1 tablet (100 mg total) by mouth 2 (two) times daily. . 180 tablet 1   loratadine (CLARITIN) 10 MG tablet Take 1 tablet (10 mg total) by mouth every morning. 30 tablet 2   Magnesium 500 MG CAPS Take 1 capsule (500 mg total) by mouth 2 (two) times daily at 8 am and 10 pm. 60 capsule 5   meloxicam (MOBIC) 15 MG tablet Take 1 tablet (15 mg total) by mouth daily. 180 tablet 0   metroNIDAZOLE (METROCREAM) 0.75 % cream APPLY A THIN LAYER TO FACE TWICE A DAY     mupirocin ointment (BACTROBAN) 2 % Place 1 application into the nose 2 (two) times daily. 22 g 0   omeprazole (PRILOSEC) 20 MG capsule Take 1 capsule (20 mg total) by mouth 2 (two) times daily before a meal. 60 capsule 1   sertraline (ZOLOFT) 25 MG tablet      tiZANidine (ZANAFLEX) 2 MG tablet Take 1 tablet (2 mg total) by mouth every 8 (eight) hours as needed for muscle spasms. 90 tablet 5   traZODone (DESYREL) 50 MG tablet Take 50-100 mg by mouth at bedtime as needed.     triamcinolone cream (KENALOG) 0.1 % Apply 1 application topically 2 (two) times daily. 80 g 0   No current facility-administered medications for this visit.     OBJECTIVE: Vitals:   05/04/19 0949  BP: 98/60  Temp: (!) 97.1 F (36.2 C)     Body mass index is 25.37 kg/m.    ECOG FS:0 - Asymptomatic  General: Well-developed, well-nourished, no acute  distress. Eyes: Pink conjunctiva, anicteric sclera. HEENT: Normocephalic, moist mucous membranes, clear oropharnyx.  No palpable lymphadenopathy. Lungs: Clear to auscultation bilaterally. Heart: Regular rate and rhythm. No rubs, murmurs, or gallops. Abdomen: Soft, nontender, nondistended. No organomegaly noted, normoactive bowel sounds. Musculoskeletal: No edema, cyanosis, or clubbing. Neuro: Alert, answering all questions appropriately. Cranial nerves grossly intact. Skin: No rashes or petechiae noted. Psych: Normal affect. Lymphatics: No cervical, calvicular, axillary or inguinal LAD.  LAB RESULTS:  Lab Results  Component Value Date   NA 137 04/30/2019   K 4.4 04/30/2019   CL 104 04/30/2019   CO2 27 04/30/2019   GLUCOSE  86 04/30/2019   BUN 15 04/30/2019   CREATININE 0.72 04/30/2019   CALCIUM 8.9 04/30/2019   PROT 6.6 04/30/2019   ALBUMIN 4.0 04/30/2019   AST 17 04/30/2019   ALT 11 04/30/2019   ALKPHOS 48 04/30/2019   BILITOT 0.9 04/30/2019   GFRNONAA >60 04/30/2019   GFRAA >60 04/30/2019    Lab Results  Component Value Date   WBC 13.0 (H) 04/30/2019   NEUTROABS 3.2 04/30/2019   HGB 13.8 04/30/2019   HCT 41.5 04/30/2019   MCV 95.2 04/30/2019   PLT 199 04/30/2019     STUDIES: Ct Soft Tissue Neck W Contrast  Result Date: 04/30/2019 CLINICAL DATA:  Chronic lymphocytic leukemia restaging. Ongoing chemotherapy. EXAM: CT NECK WITH CONTRAST TECHNIQUE: Multidetector CT imaging of the neck was performed using the standard protocol following the bolus administration of intravenous contrast. CONTRAST:  111m OMNIPAQUE IOHEXOL 300 MG/ML  SOLN COMPARISON:  10/22/2018 FINDINGS: Pharynx and larynx: No evidence of mass or swelling. Patent airway. No retropharyngeal fluid. Salivary glands: No inflammation, mass, or stone. Thyroid: Unremarkable. Lymph nodes: Small residual lymph nodes in the neck bilaterally have further decreased in size and all measure 5 mm or less in short axis.  There is no evidence of new lymphadenopathy in the neck. Vascular: Major vascular structures of the neck are patent. Limited intracranial: Unremarkable. Visualized orbits: Unremarkable. Mastoids and visualized paranasal sinuses: Or suspicious osseous lesion clear. Skeleton: No acute osseous abnormality. Upper chest: Chest findings, including subpectoral and axillary lymph nodes, are reported separately. Other: None. IMPRESSION: Further decrease in size of small residual cervical lymph nodes. No new findings in the neck. Electronically Signed   By: ALogan BoresM.D.   On: 04/30/2019 19:09   Ct Chest W Contrast  Result Date: 04/30/2019 CLINICAL DATA:  Chronic lymphocytic leukemia restaging EXAM: CT CHEST, ABDOMEN, AND PELVIS WITH CONTRAST TECHNIQUE: Multidetector CT imaging of the chest, abdomen and pelvis was performed following the standard protocol during bolus administration of intravenous contrast. CONTRAST:  1080mOMNIPAQUE IOHEXOL 300 MG/ML  SOLN COMPARISON:  10/22/2018 FINDINGS: CT CHEST FINDINGS Cardiovascular: Atherosclerotic calcification of the aortic arch. Mediastinum/Nodes: Right paratracheal node 0.7 cm in short axis on image 24/3, formerly 1.0 cm. Left axillary node 1.0 cm in short axis on image 19/3, formerly 1.3 cm. Similar reduction in other axillary and subpectoral lymph nodes Lungs/Pleura: Centrilobular emphysema. Stable 4 mm in diameter right upper lobe subpleural nodule on image 44/4. Stable small subpleural lymph node along the minor fissure on images 84-80 5/4. Stable 3 by 4 mm right middle lobe pulmonary nodule, image 91/4. Stable 3 mm left lower lobe nodule on image 109/4. Musculoskeletal: Unremarkable CT ABDOMEN PELVIS FINDINGS Hepatobiliary: No significant liver lesions. Gallbladder unremarkable. Pancreas: Unremarkable Spleen: Unremarkable.  No splenomegaly. Adrenals/Urinary Tract: 1.0 by 1.7 cm left adrenal mass, stable from 01/21/2017, previously characterized as adenoma. The kidneys  appear unremarkable. Stomach/Bowel: Unremarkable Vascular/Lymphatic: Aortoiliac atherosclerotic vascular disease. Improved adenopathy in the abdomen/pelvis. Indistinctly marginated left para-aortic adenopathy measures up to 2.1 cm in conglomerate thickness on image 82/3, formerly 3.1 cm. A right pericaval node measures 1.0 cm in short axis on image 90/3, formerly 1.5 cm. Left external iliac node 1.1 cm in short axis on image 116/3, formerly 1.5 cm. Right pelvic sidewall lymph node 1.1 cm in short axis on image 113/3, formerly 1.3 cm. Left mesenteric lymph node 0.9 cm in short axis on image 86/3, formerly 1.4 cm. Reproductive: Small cyst or follicle of the left ovary. Uterus unremarkable. Other: No  supplemental non-categorized findings. Musculoskeletal: Broad right transverse process of L5 pseudo articulates with the sacrum and is partially fused with the sacrum. IMPRESSION: 1. Further improvement in adenopathy in the chest, abdomen, and pelvis. Pathologic adenopathy has not completely resolved but is significantly improved at all levels. 2. Other imaging findings of potential clinical significance: Aortic Atherosclerosis (ICD10-I70.0) and Emphysema (ICD10-J43.9). Stable small pulmonary nodules in the 3-4 mm range, likely benign. Stable left adrenal adenoma. Electronically Signed   By: Van Clines M.D.   On: 04/30/2019 13:02   Ct Abdomen Pelvis W Contrast  Result Date: 04/30/2019 CLINICAL DATA:  Chronic lymphocytic leukemia restaging EXAM: CT CHEST, ABDOMEN, AND PELVIS WITH CONTRAST TECHNIQUE: Multidetector CT imaging of the chest, abdomen and pelvis was performed following the standard protocol during bolus administration of intravenous contrast. CONTRAST:  124m OMNIPAQUE IOHEXOL 300 MG/ML  SOLN COMPARISON:  10/22/2018 FINDINGS: CT CHEST FINDINGS Cardiovascular: Atherosclerotic calcification of the aortic arch. Mediastinum/Nodes: Right paratracheal node 0.7 cm in short axis on image 24/3, formerly 1.0  cm. Left axillary node 1.0 cm in short axis on image 19/3, formerly 1.3 cm. Similar reduction in other axillary and subpectoral lymph nodes Lungs/Pleura: Centrilobular emphysema. Stable 4 mm in diameter right upper lobe subpleural nodule on image 44/4. Stable small subpleural lymph node along the minor fissure on images 84-80 5/4. Stable 3 by 4 mm right middle lobe pulmonary nodule, image 91/4. Stable 3 mm left lower lobe nodule on image 109/4. Musculoskeletal: Unremarkable CT ABDOMEN PELVIS FINDINGS Hepatobiliary: No significant liver lesions. Gallbladder unremarkable. Pancreas: Unremarkable Spleen: Unremarkable.  No splenomegaly. Adrenals/Urinary Tract: 1.0 by 1.7 cm left adrenal mass, stable from 01/21/2017, previously characterized as adenoma. The kidneys appear unremarkable. Stomach/Bowel: Unremarkable Vascular/Lymphatic: Aortoiliac atherosclerotic vascular disease. Improved adenopathy in the abdomen/pelvis. Indistinctly marginated left para-aortic adenopathy measures up to 2.1 cm in conglomerate thickness on image 82/3, formerly 3.1 cm. A right pericaval node measures 1.0 cm in short axis on image 90/3, formerly 1.5 cm. Left external iliac node 1.1 cm in short axis on image 116/3, formerly 1.5 cm. Right pelvic sidewall lymph node 1.1 cm in short axis on image 113/3, formerly 1.3 cm. Left mesenteric lymph node 0.9 cm in short axis on image 86/3, formerly 1.4 cm. Reproductive: Small cyst or follicle of the left ovary. Uterus unremarkable. Other: No supplemental non-categorized findings. Musculoskeletal: Broad right transverse process of L5 pseudo articulates with the sacrum and is partially fused with the sacrum. IMPRESSION: 1. Further improvement in adenopathy in the chest, abdomen, and pelvis. Pathologic adenopathy has not completely resolved but is significantly improved at all levels. 2. Other imaging findings of potential clinical significance: Aortic Atherosclerosis (ICD10-I70.0) and Emphysema  (ICD10-J43.9). Stable small pulmonary nodules in the 3-4 mm range, likely benign. Stable left adrenal adenoma. Electronically Signed   By: WVan ClinesM.D.   On: 04/30/2019 13:02    ASSESSMENT: CLL  PLAN:    1. CLL: Confirmed by peripheral blood flow cytometry.  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  CT scan results from April 30, 2019 reviewed independently and reported as above with continued improvement of patient's known lymphadenopathy.  Patient completed cycle 3 of Rituxan plus Treanda on March 26, 2018, but was noted to have progressive disease therefore initiated 480 mg Imbruvica daily.  Continue current treatment indefinitely until progression of disease or intolerable side effects.  Return to clinic in 3 months for further evaluation and laboratory  work.  Will repeat imaging in 6 months.   2.  Leukocytosis: White count slightly elevated today.  Continue Imbruvica as above. 3.  Trigeminal neuralgia: Patient does not complain of this today.  Appears to be unrelated to CLL or Imbruvica.  MRI of the brain on Jan 25, 2019 was unremarkable.   4.  Depression/anxiety: Chronic.  Continue follow-up and treatment per primary care. 5.  Fatigue: Multifactorial, monitor. 6.  Back pain: Continue evaluation and treatment per primary care.   Patient expressed understanding and was in agreement with this plan. She also understands that She can call clinic at any time with any questions, concerns, or complaints.    Lloyd Huger, MD   05/04/2019 11:40 AM

## 2019-04-30 ENCOUNTER — Inpatient Hospital Stay: Payer: BC Managed Care – PPO | Attending: Oncology

## 2019-04-30 ENCOUNTER — Ambulatory Visit
Admission: RE | Admit: 2019-04-30 | Discharge: 2019-04-30 | Disposition: A | Discharge status: Home / Self Care | Payer: BC Managed Care – PPO | Source: Ambulatory Visit | Attending: Oncology | Admitting: Oncology

## 2019-04-30 ENCOUNTER — Ambulatory Visit: Payer: BC Managed Care – PPO

## 2019-04-30 ENCOUNTER — Other Ambulatory Visit: Payer: Self-pay

## 2019-04-30 ENCOUNTER — Other Ambulatory Visit: Payer: Self-pay | Admitting: *Deleted

## 2019-04-30 DIAGNOSIS — R5383 Other fatigue: Secondary | ICD-10-CM | POA: Diagnosis not present

## 2019-04-30 DIAGNOSIS — F418 Other specified anxiety disorders: Secondary | ICD-10-CM | POA: Insufficient documentation

## 2019-04-30 DIAGNOSIS — M545 Low back pain: Secondary | ICD-10-CM | POA: Diagnosis not present

## 2019-04-30 DIAGNOSIS — C9110 Chronic lymphocytic leukemia of B-cell type not having achieved remission: Secondary | ICD-10-CM | POA: Insufficient documentation

## 2019-04-30 DIAGNOSIS — R599 Enlarged lymph nodes, unspecified: Secondary | ICD-10-CM | POA: Diagnosis not present

## 2019-04-30 DIAGNOSIS — Z720 Tobacco use: Secondary | ICD-10-CM | POA: Insufficient documentation

## 2019-04-30 DIAGNOSIS — G8929 Other chronic pain: Secondary | ICD-10-CM | POA: Insufficient documentation

## 2019-04-30 LAB — CBC WITH DIFFERENTIAL/PLATELET
Abs Immature Granulocytes: 0.06 10*3/uL (ref 0.00–0.07)
Basophils Absolute: 0.1 10*3/uL (ref 0.0–0.1)
Basophils Relative: 1 %
Eosinophils Absolute: 0.1 10*3/uL (ref 0.0–0.5)
Eosinophils Relative: 1 %
HCT: 41.5 % (ref 36.0–46.0)
Hemoglobin: 13.8 g/dL (ref 12.0–15.0)
Immature Granulocytes: 1 %
Lymphocytes Relative: 66 %
Lymphs Abs: 8.6 10*3/uL — ABNORMAL HIGH (ref 0.7–4.0)
MCH: 31.7 pg (ref 26.0–34.0)
MCHC: 33.3 g/dL (ref 30.0–36.0)
MCV: 95.2 fL (ref 80.0–100.0)
Monocytes Absolute: 1 10*3/uL (ref 0.1–1.0)
Monocytes Relative: 7 %
Neutro Abs: 3.2 10*3/uL (ref 1.7–7.7)
Neutrophils Relative %: 24 %
Platelets: 199 10*3/uL (ref 150–400)
RBC: 4.36 MIL/uL (ref 3.87–5.11)
RDW: 13.7 % (ref 11.5–15.5)
WBC: 13 10*3/uL — ABNORMAL HIGH (ref 4.0–10.5)
nRBC: 0 % (ref 0.0–0.2)

## 2019-04-30 LAB — COMPREHENSIVE METABOLIC PANEL
ALT: 11 U/L (ref 0–44)
AST: 17 U/L (ref 15–41)
Albumin: 4 g/dL (ref 3.5–5.0)
Alkaline Phosphatase: 48 U/L (ref 38–126)
Anion gap: 6 (ref 5–15)
BUN: 15 mg/dL (ref 6–20)
CO2: 27 mmol/L (ref 22–32)
Calcium: 8.9 mg/dL (ref 8.9–10.3)
Chloride: 104 mmol/L (ref 98–111)
Creatinine, Ser: 0.72 mg/dL (ref 0.44–1.00)
GFR calc Af Amer: >60 mL/min (ref >60)
GFR calc non Af Amer: >60 mL/min (ref >60)
Glucose, Bld: 86 mg/dL (ref 70–99)
Potassium: 4.4 mmol/L (ref 3.5–5.1)
Sodium: 137 mmol/L (ref 135–145)
Total Bilirubin: 0.9 mg/dL (ref 0.3–1.2)
Total Protein: 6.6 g/dL (ref 6.5–8.1)

## 2019-04-30 MED ORDER — IOHEXOL 300 MG/ML  SOLN
75.0000 mL | Freq: Once | INTRAMUSCULAR | Status: AC | PRN
  Administered 2019-04-30: 100 mL via INTRAVENOUS

## 2019-04-30 NOTE — Progress Notes (Signed)
Entered in error

## 2019-05-04 ENCOUNTER — Encounter: Payer: Self-pay | Admitting: Oncology

## 2019-05-04 ENCOUNTER — Other Ambulatory Visit: Payer: Self-pay

## 2019-05-04 ENCOUNTER — Inpatient Hospital Stay (HOSPITAL_BASED_OUTPATIENT_CLINIC_OR_DEPARTMENT_OTHER): Payer: BC Managed Care – PPO | Admitting: Oncology

## 2019-05-04 VITALS — BP 98/60 | Temp 97.1°F | Wt 162.0 lb

## 2019-05-04 DIAGNOSIS — C9110 Chronic lymphocytic leukemia of B-cell type not having achieved remission: Secondary | ICD-10-CM

## 2019-05-04 DIAGNOSIS — R5383 Other fatigue: Secondary | ICD-10-CM | POA: Diagnosis not present

## 2019-05-04 DIAGNOSIS — Z720 Tobacco use: Secondary | ICD-10-CM | POA: Diagnosis not present

## 2019-05-04 DIAGNOSIS — G8929 Other chronic pain: Secondary | ICD-10-CM | POA: Diagnosis not present

## 2019-05-04 DIAGNOSIS — F418 Other specified anxiety disorders: Secondary | ICD-10-CM | POA: Diagnosis not present

## 2019-05-04 DIAGNOSIS — M545 Low back pain: Secondary | ICD-10-CM | POA: Diagnosis not present

## 2019-05-10 ENCOUNTER — Telehealth: Payer: Self-pay

## 2019-05-10 NOTE — Telephone Encounter (Signed)
Patient verbalized understanding and made appointment for 2 to 3 months

## 2019-05-10 NOTE — Telephone Encounter (Signed)
-----   Message from Virgel Manifold, MD sent at 05/04/2019  1:32 PM EDT ----- Monique [name] please let patient know, her colon polyps were benign.  Repeat colonoscopy recommended in 5 years.  She should follow-up with Dr. Vicente Males in regard to her change in bowel habits.  Please set up clinic appointment with him in 2 to 3 months if she does not already have one.

## 2019-05-10 NOTE — Telephone Encounter (Signed)
LMTCO. Recall set for repeat colonoscopy reminder in 5 years.

## 2019-05-10 NOTE — Telephone Encounter (Signed)
Patient returning call to [facility] for results.

## 2019-05-17 MED FILL — IMBRUVICA 420 MG TAB: 420 | 28 days supply | Qty: 28 | Fill #1

## 2019-05-20 ENCOUNTER — Other Ambulatory Visit: Payer: Self-pay | Admitting: Family Medicine

## 2019-05-20 DIAGNOSIS — L299 Pruritus, unspecified: Secondary | ICD-10-CM

## 2019-05-20 NOTE — Telephone Encounter (Signed)
Requested medication (s) are due for refill today: yes  Requested medication (s) are on the active medication list: yes   Last refill:  04/28/19  Future visit scheduled: yes  Notes to clinic: Patient requesting 90 day with 1 refill   Requested Prescriptions  Pending Prescriptions Disp Refills   hydrOXYzine (ATARAX/VISTARIL) 25 MG tablet [Pharmacy Med Name: HYDROXYZINE HCL 25 MG TABLET] 90 tablet 1    Sig: TAKE 1 TABLET BY MOUTH EVERYDAY AT BEDTIME     Ear, Nose, and Throat:  Antihistamines Passed - 05/20/2019 11:31 AM      Passed - Valid encounter within last 12 months    Recent Outpatient Visits          3 weeks ago Trigeminal neuralgia of right side of face   [facility] [name], MD   1 month ago H. pylori infection   [place], FNP   1 month ago GERD without esophagitis   [place], FNP   3 months ago Nose ulceration   [facility] [place], Drue Stager, MD   4 months ago Change in bowel movement   [facility] [name], MD      Future Appointments            In 2 months Grayland Ormond, Kathlene November, MD [facility]   In 2 months Jonathon Bellows, [place]

## 2019-05-31 ENCOUNTER — Telehealth: Payer: Self-pay | Admitting: *Deleted

## 2019-06-01 ENCOUNTER — Other Ambulatory Visit: Payer: Self-pay

## 2019-06-01 ENCOUNTER — Other Ambulatory Visit: Payer: Self-pay | Admitting: *Deleted

## 2019-06-01 DIAGNOSIS — G500 Trigeminal neuralgia: Secondary | ICD-10-CM

## 2019-06-01 DIAGNOSIS — L299 Pruritus, unspecified: Secondary | ICD-10-CM

## 2019-06-01 DIAGNOSIS — C9110 Chronic lymphocytic leukemia of B-cell type not having achieved remission: Secondary | ICD-10-CM

## 2019-06-01 DIAGNOSIS — K219 Gastro-esophageal reflux disease without esophagitis: Secondary | ICD-10-CM

## 2019-06-01 DIAGNOSIS — J439 Emphysema, unspecified: Secondary | ICD-10-CM

## 2019-06-01 DIAGNOSIS — I700 Atherosclerosis of aorta: Secondary | ICD-10-CM

## 2019-06-01 DIAGNOSIS — J438 Other emphysema: Secondary | ICD-10-CM

## 2019-06-01 MED ORDER — IMBRUVICA 420 MG PO TABS: 1.0000 | ORAL_TABLET | Freq: Every day | ORAL | 2 refills | Status: DC

## 2019-06-02 MED ORDER — HYDROXYZINE HCL 25 MG PO TABS: ORAL_TABLET | ORAL | 1 refills | Status: DC

## 2019-06-02 MED ORDER — ATORVASTATIN CALCIUM 10 MG PO TABS: 10.0000 mg | ORAL_TABLET | Freq: Every day | ORAL | 1 refills | Status: DC

## 2019-06-02 MED ORDER — OMEPRAZOLE 20 MG PO CPDR: 20.0000 mg | DELAYED_RELEASE_CAPSULE | Freq: Two times a day (BID) | ORAL | 1 refills | Status: DC

## 2019-06-02 MED ORDER — TRELEGY ELLIPTA 100-62.5-25 MCG/INH IN AEPB: 1.0000 | INHALATION_SPRAY | Freq: Every day | RESPIRATORY_TRACT | 3 refills | Status: DC

## 2019-06-02 MED ORDER — LORATADINE 10 MG PO TABS: 10.0000 mg | ORAL_TABLET | ORAL | 1 refills | Status: DC

## 2019-06-02 MED ORDER — ALBUTEROL SULFATE HFA 108 (90 BASE) MCG/ACT IN AERS: 2.0000 | INHALATION_SPRAY | Freq: Four times a day (QID) | RESPIRATORY_TRACT | 0 refills | Status: DC | PRN

## 2019-06-02 MED ORDER — GABAPENTIN 100 MG PO CAPS: 100.0000 mg | ORAL_CAPSULE | Freq: Two times a day (BID) | ORAL | 1 refills | Status: DC

## 2019-06-08 MED ORDER — IMBRUVICA 420 MG PO TABS: 1.0000 | ORAL_TABLET | Freq: Every day | ORAL | 2 refills | Status: DC

## 2019-06-08 NOTE — Telephone Encounter (Addendum)
Oral Chemotherapy Pharmacist Encounter   Received forwareded VM from Triage. VM was from [facility] stating that they received a prescription from [place] (ibrutinib) but could not fill the medication because it was a specialty medication. Ms. Mckiver fills her ibrutinib at [facility]. Prescription was redicted to [place].  Called Ms. Hendy to let her know [facility] could not fill this specialty medication.  [name], PharmD, BCPS, [facility] Hematology/Oncology Clinical Pharmacist [facility] [phone]  06/08/2019 11:08 AM

## 2019-06-08 NOTE — Addendum Note (Signed)
Addended by: [name] on: 06/08/2019 11:22 AM   Modules accepted: Orders

## 2019-06-10 ENCOUNTER — Other Ambulatory Visit: Payer: Self-pay | Admitting: Family Medicine

## 2019-06-10 DIAGNOSIS — G500 Trigeminal neuralgia: Secondary | ICD-10-CM

## 2019-06-10 DIAGNOSIS — E559 Vitamin D deficiency, unspecified: Secondary | ICD-10-CM

## 2019-06-10 MED ORDER — MAGNESIUM 500 MG PO CAPS: 500.0000 mg | ORAL_CAPSULE | Freq: Two times a day (BID) | ORAL | 5 refills | Status: DC

## 2019-06-10 MED ORDER — LAMOTRIGINE 100 MG PO TABS: 100.0000 mg | ORAL_TABLET | Freq: Two times a day (BID) | ORAL | 1 refills | Status: DC

## 2019-06-10 NOTE — Telephone Encounter (Signed)
Requested medication (s) are due for refill today: yes  Requested medication (s) are on the active medication list: yes  Last refill:  10/27/2018  Future visit scheduled: no  Notes to clinic: review for refill   Requested Prescriptions  Pending Prescriptions Disp Refills   lamoTRIgine (LAMICTAL) 100 MG tablet 180 tablet 1    Sig: Take 1 tablet (100 mg total) by mouth 2 (two) times daily. .     Not Delegated - Neurology:  Anticonvulsants Failed - 06/10/2019 12:42 PM      Failed - This refill cannot be delegated      Failed - WBC in normal range and within 360 days    WBC  Date Value Ref Range Status  04/30/2019 13.0 (H) 4.0 - 10.5 K/uL Final         Passed - HCT in normal range and within 360 days    HCT  Date Value Ref Range Status  04/30/2019 41.5 36.0 - 46.0 % Final         Passed - HGB in normal range and within 360 days    Hemoglobin  Date Value Ref Range Status  04/30/2019 13.8 12.0 - 15.0 g/dL Final         Passed - PLT in normal range and within 360 days    Platelets  Date Value Ref Range Status  04/30/2019 199 150 - 400 K/uL Final         Passed - Valid encounter within last 12 months    Recent Outpatient Visits          1 month ago Trigeminal neuralgia of right side of face   [facility] [name], MD   1 month ago H. pylori infection   [place], Astrid Divine, FNP   2 months ago GERD without esophagitis   [place], FNP   4 months ago Nose ulceration   [facility] [place], Drue Stager, MD   4 months ago Change in bowel movement   [facility], Drue Stager, MD      Future Appointments            In 1 month Grayland Ormond, Kathlene November, MD [facility]   In 2 months Jonathon Bellows, MD [facility]            Magnesium 500 MG CAPS 60 capsule 5    Sig: Take 1 capsule (500 mg total) by mouth  2 (two) times daily at 8 am and 10 pm.     Endocrinology:  Minerals - Magnesium Supplementation Passed - 06/10/2019 12:42 PM      Passed - Mg Level in normal range and within 360 days    Magnesium  Date Value Ref Range Status  01/27/2019 2.1 1.7 - 2.4 mg/dL Final    Comment:    Performed at [facility], [address] [place], [place] [address]         [place] encounter within last 12 months    Recent Outpatient Visits          1 month ago Trigeminal neuralgia of right side of face   [facility] [name], MD   1 month ago H. pylori infection   [place], FNP   2 months ago GERD without esophagitis   [place], Astrid Divine, [place]  4 months ago Nose ulceration   [facility] [place], Drue Stager, MD   4 months ago Change in bowel movement   [facility] [name], MD      Future Appointments            In 1 month Grayland Ormond, Kathlene November, MD [facility]   In 2 months Jonathon Bellows, [place]

## 2019-06-10 NOTE — Telephone Encounter (Signed)
Left message for patient to contact Neurologist for refill on Lamictal.

## 2019-06-10 NOTE — Telephone Encounter (Signed)
Medication Refill - Medication:lamoTRIgine (LAMICTAL) 100 MG tablet /Magnesium 500 MG CAPS/ Pt's new pharmacy [facility] called to get new rxs for lamotrigine and magnesium. They were able to have all other medications transferred locally. Fax was sent on 06/01/19 with request as well. Please advise.  Has the patient contacted their pharmacy? Yes.   (Agent: If no, request that the patient contact the pharmacy for the refill.) (Agent: If yes, when and what did the pharmacy advise?)  Preferred Pharmacy (with phone number or street name): [place], [place] [phone] (Phone) [phone] (Fax)     Agent: Please be advised that RX refills may take up to 3 business days. We ask that you follow-up with your pharmacy.

## 2019-06-12 ENCOUNTER — Other Ambulatory Visit: Payer: Self-pay | Admitting: Family Medicine

## 2019-06-12 DIAGNOSIS — K219 Gastro-esophageal reflux disease without esophagitis: Secondary | ICD-10-CM

## 2019-06-14 ENCOUNTER — Telehealth: Payer: Self-pay | Admitting: Pain Medicine

## 2019-06-14 ENCOUNTER — Other Ambulatory Visit: Payer: Self-pay | Admitting: Family Medicine

## 2019-06-14 DIAGNOSIS — E559 Vitamin D deficiency, unspecified: Secondary | ICD-10-CM

## 2019-06-14 NOTE — Telephone Encounter (Signed)
Patient lvmail 06-11-19 at 11:54 stating she wants to schedule procedure for pain in her legs. I found an order for Transforaminal Epidural from Dr. Dossie Arbour in 2019. Is this for her leg pain? Or if not please check with Dr. Dossie Arbour and let me know what to schedule.

## 2019-06-14 NOTE — Telephone Encounter (Signed)
rx refill Magnesium Magnesium 500 MG CAPS PHARMACY [place], [place] [phone] (Phone) [phone] (Fax)    [place] never received first prescription. Pharmacist called for PCP to resend

## 2019-06-14 NOTE — Telephone Encounter (Signed)
It looks like there are orders for LESI and TFESI. He usually does both together. I can only assume that is the appropriate procedure. Thanks.

## 2019-06-14 NOTE — Telephone Encounter (Signed)
Blanch Media this patient will need approval for appt. Wants to have procedure she had before.

## 2019-06-15 MED ORDER — MAGNESIUM 500 MG PO CAPS: 500.0000 mg | ORAL_CAPSULE | Freq: Two times a day (BID) | ORAL | 5 refills | Status: AC

## 2019-06-17 MED FILL — IMBRUVICA 420 MG TAB: 420 | 28 days supply | Qty: 28 | Fill #2

## 2019-06-24 ENCOUNTER — Other Ambulatory Visit: Payer: Self-pay | Admitting: Family Medicine

## 2019-06-24 DIAGNOSIS — J438 Other emphysema: Secondary | ICD-10-CM

## 2019-06-24 NOTE — Telephone Encounter (Signed)
Requested medication (s) are due for refill today: yes  Requested medication (s) are on the active medication list: yes  Last refill: 06/18/2019  Future visit scheduled: no  Notes to clinic: One inhaler should last at least one month. If the patient is requesting refills earlier    Requested Prescriptions  Pending Prescriptions Disp Refills   albuterol (VENTOLIN HFA) 108 (90 Base) MCG/ACT inhaler [Pharmacy Med Name: ALBUTEROL HFA *PROA* 90MCG 108 (90 BAS Aerosol] 8.5 g 0    Sig: INHALE TWO (2) PUFFS BY MOUTH EVERY 6 HOURS AS NEEDED FOR SHORTNESS OF BREATH AND WHEEZING     Pulmonology:  Beta Agonists Failed - 06/24/2019  2:21 PM      Failed - One inhaler should last at least one month. If the patient is requesting refills earlier, contact the patient to check for uncontrolled symptoms.      Passed - Valid encounter within last 12 months    Recent Outpatient Visits          1 month ago Trigeminal neuralgia of right side of face   [facility] [name], MD   2 months ago H. pylori infection   [place], FNP   2 months ago GERD without esophagitis   [place], FNP   5 months ago Nose ulceration   [facility] [name], MD   5 months ago Change in bowel movement   [facility] [name], MD      Future Appointments            In 1 month Grayland Ormond, Kathlene November, MD [facility]   In 1 month Jonathon Bellows, MD [place]

## 2019-06-26 DIAGNOSIS — S80861A Insect bite (nonvenomous), right lower leg, initial encounter: Secondary | ICD-10-CM | POA: Diagnosis not present

## 2019-06-26 DIAGNOSIS — S40861A Insect bite (nonvenomous) of right upper arm, initial encounter: Secondary | ICD-10-CM | POA: Diagnosis not present

## 2019-06-26 DIAGNOSIS — S40862A Insect bite (nonvenomous) of left upper arm, initial encounter: Secondary | ICD-10-CM | POA: Diagnosis not present

## 2019-06-26 DIAGNOSIS — S80862A Insect bite (nonvenomous), left lower leg, initial encounter: Secondary | ICD-10-CM | POA: Diagnosis not present

## 2019-06-29 ENCOUNTER — Ambulatory Visit
Admission: RE | Admit: 2019-06-29 | Discharge: 2019-06-29 | Disposition: A | Discharge status: Home / Self Care | Payer: BC Managed Care – PPO | Source: Ambulatory Visit | Attending: Pain Medicine | Admitting: Pain Medicine

## 2019-06-29 ENCOUNTER — Other Ambulatory Visit: Payer: Self-pay

## 2019-06-29 ENCOUNTER — Ambulatory Visit (HOSPITAL_BASED_OUTPATIENT_CLINIC_OR_DEPARTMENT_OTHER): Payer: BC Managed Care – PPO | Admitting: Pain Medicine

## 2019-06-29 ENCOUNTER — Encounter: Payer: Self-pay | Admitting: Pain Medicine

## 2019-06-29 VITALS — BP 102/63 | HR 83 | Temp 97.4°F | Resp 18 | Ht 67.0 in | Wt 161.0 lb

## 2019-06-29 DIAGNOSIS — M5126 Other intervertebral disc displacement, lumbar region: Secondary | ICD-10-CM | POA: Diagnosis not present

## 2019-06-29 DIAGNOSIS — G8929 Other chronic pain: Secondary | ICD-10-CM | POA: Insufficient documentation

## 2019-06-29 DIAGNOSIS — M5416 Radiculopathy, lumbar region: Secondary | ICD-10-CM

## 2019-06-29 DIAGNOSIS — M5136 Other intervertebral disc degeneration, lumbar region: Secondary | ICD-10-CM | POA: Insufficient documentation

## 2019-06-29 DIAGNOSIS — M79604 Pain in right leg: Secondary | ICD-10-CM | POA: Diagnosis not present

## 2019-06-29 DIAGNOSIS — M79605 Pain in left leg: Secondary | ICD-10-CM | POA: Insufficient documentation

## 2019-06-29 MED ORDER — DEXAMETHASONE SODIUM PHOSPHATE 10 MG/ML IJ SOLN
INTRAMUSCULAR | Status: AC
  Filled 2019-06-29: qty 1

## 2019-06-29 MED ORDER — TRIAMCINOLONE ACETONIDE 40 MG/ML IJ SUSP
40.0000 mg | Freq: Once | INTRAMUSCULAR | Status: AC
  Administered 2019-06-29: 40 mg

## 2019-06-29 MED ORDER — IOHEXOL 180 MG/ML  SOLN
10.0000 mL | Freq: Once | INTRAMUSCULAR | Status: AC
  Administered 2019-06-29: 10 mL via EPIDURAL

## 2019-06-29 MED ORDER — SODIUM CHLORIDE 0.9% FLUSH
1.0000 mL | Freq: Once | INTRAVENOUS | Status: AC
  Administered 2019-06-29: 1 mL

## 2019-06-29 MED ORDER — FENTANYL CITRATE (PF) 100 MCG/2ML IJ SOLN
INTRAMUSCULAR | Status: AC
  Filled 2019-06-29: qty 2

## 2019-06-29 MED ORDER — MIDAZOLAM HCL 5 MG/5ML IJ SOLN
INTRAMUSCULAR | Status: AC
  Filled 2019-06-29: qty 5

## 2019-06-29 MED ORDER — SODIUM CHLORIDE 0.9% FLUSH
2.0000 mL | Freq: Once | INTRAVENOUS | Status: AC
  Administered 2019-06-29: 2 mL

## 2019-06-29 MED ORDER — LIDOCAINE HCL 2 % IJ SOLN
INTRAMUSCULAR | Status: AC
  Filled 2019-06-29: qty 20

## 2019-06-29 MED ORDER — LIDOCAINE HCL 2 % IJ SOLN
20.0000 mL | Freq: Once | INTRAMUSCULAR | Status: AC
  Administered 2019-06-29: 400 mg

## 2019-06-29 MED ORDER — ROPIVACAINE HCL 2 MG/ML IJ SOLN
INTRAMUSCULAR | Status: AC
  Filled 2019-06-29: qty 10

## 2019-06-29 MED ORDER — TRIAMCINOLONE ACETONIDE 40 MG/ML IJ SUSP
INTRAMUSCULAR | Status: AC
  Filled 2019-06-29: qty 1

## 2019-06-29 MED ORDER — FENTANYL CITRATE (PF) 100 MCG/2ML IJ SOLN
25.0000 ug | INTRAMUSCULAR | Status: DC | PRN
  Administered 2019-06-29: 100 ug via INTRAVENOUS

## 2019-06-29 MED ORDER — ROPIVACAINE HCL 2 MG/ML IJ SOLN
2.0000 mL | Freq: Once | INTRAMUSCULAR | Status: AC
  Administered 2019-06-29: 2 mL via EPIDURAL

## 2019-06-29 MED ORDER — SODIUM CHLORIDE (PF) 0.9 % IJ SOLN
INTRAMUSCULAR | Status: AC
  Filled 2019-06-29: qty 10

## 2019-06-29 MED ORDER — ROPIVACAINE HCL 2 MG/ML IJ SOLN
1.0000 mL | Freq: Once | INTRAMUSCULAR | Status: AC
  Administered 2019-06-29: 1 mL via EPIDURAL

## 2019-06-29 MED ORDER — LACTATED RINGERS IV SOLN
1000.0000 mL | Freq: Once | INTRAVENOUS | Status: AC
  Administered 2019-06-29: 1000 mL via INTRAVENOUS

## 2019-06-29 MED ORDER — MIDAZOLAM HCL 5 MG/5ML IJ SOLN
1.0000 mg | INTRAMUSCULAR | Status: DC | PRN
  Administered 2019-06-29: 5 mg via INTRAVENOUS

## 2019-06-29 MED ORDER — DEXAMETHASONE SODIUM PHOSPHATE 10 MG/ML IJ SOLN
10.0000 mg | Freq: Once | INTRAMUSCULAR | Status: AC
  Administered 2019-06-29: 10 mg

## 2019-06-29 NOTE — Patient Instructions (Signed)

## 2019-06-29 NOTE — Progress Notes (Signed)
Patient's Name: Monique [name]  MRN: [id number]  Referring Provider: [name], MD  DOB: 12/23/1970  PCP: [name], MD  DOS: 06/29/2019  Note by: Gaspar Cola, MD  Service setting: Ambulatory outpatient  Specialty: Interventional Pain Management  Patient type: Established  Location: [facility] (AMB) Pain Management Facility  Visit type: Interventional Procedure   Primary Reason for Visit: Interventional Pain Management Treatment. CC: Back Pain (lower) and Leg Pain (bilateral)  Procedure #1:  Anesthesia, Analgesia, Anxiolysis:  Type: Therapeutic Trans-Foraminal Epidural Steroid Injection   #2  Region: Lumbar Level: L4 Paravertebral Laterality: Right-Sided Paravertebral   Type: Moderate (Conscious) Sedation combined with Local Anesthesia Indication(s): Analgesia and Anxiety Route: Intravenous (IV) IV Access: Secured Sedation: Meaningful verbal contact was maintained at all times during the procedure  Local Anesthetic: Lidocaine 1-2%  Position: Prone  Procedure #2:    Type: Therapeutic Inter-Laminar Epidural Steroid Injection   #2  Region: Lumbar Level: L3-4 Level. Laterality: Right-Sided Paramedial     Indications: 1. DDD (degenerative disc disease), lumbar   2. Chronic lower extremity pain (Primary Area of Pain) (Bilateral) (R>L)   3. Lumbar radiculitis (Right)   4. 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 (year old), female patient, seen today for interventional treatment. She  has a past surgical history that includes Cervical biopsy w/ loop electrode excision; Tubal ligation; Breast biopsy (Right, 01/05/2018); OTHER SURGICAL HISTORY; and Colonoscopy with propofol (N/A, 04/21/2019). Monique [name] has a current medication list which includes the following prescription(s): albuterol, trelegy ellipta, gabapentin, imbruvica, lamotrigine, loratadine, magnesium, metronidazole,  mupirocin ointment, omeprazole, sertraline, trazodone, triamcinolone cream, atorvastatin, gnp calcium 1200, hydroxyzine, meloxicam, and tizanidine, and the following Facility-Administered Medications: fentanyl and midazolam. Her primarily concern today is the Back Pain (lower) and Leg Pain (bilateral)  Initial Vital Signs:  Pulse/HCG Rate: 83ECG Heart Rate: 84 Temp: 98.5 F (36.9 C) Resp: 16 BP: (!) 111/47 SpO2: 99 %  BMI: Estimated body mass index is 25.22 kg/m as calculated from the following:   Height as of this encounter: 5\' 7"  (1.702 m).   Weight as of this encounter: 161 lb (73 kg).  Risk Assessment: Allergies: Reviewed. She has No Known Allergies.  Allergy Precautions: None required Coagulopathies: Reviewed. None identified.  Blood-thinner therapy: None at this time Active Infection(s): Reviewed. None identified. Monique [name] is afebrile  Site Confirmation: Monique [name] was asked to confirm the procedure and laterality before marking the site Procedure checklist: Completed Consent: Before the procedure and under the influence of no sedative(s), amnesic(s), or anxiolytics, the patient was informed of the treatment options, risks and possible complications. To fulfill our ethical and legal obligations, as recommended by the American Medical Association's Code of Ethics, I have informed the patient of my clinical impression; the nature and purpose of the treatment or procedure; the risks, benefits, and possible complications of the intervention; the alternatives, including doing nothing; the risk(s) and benefit(s) of the alternative treatment(s) or procedure(s); and the risk(s) and benefit(s) of doing nothing. The patient was provided information about the general risks and possible complications associated with the procedure. These may include, but are not limited to: failure to achieve desired goals, infection, bleeding, organ or nerve damage, allergic reactions, paralysis, and death. In  addition, the patient was informed of those risks and complications associated to Spine-related procedures, such as failure to decrease pain; infection (i.e.: Meningitis, epidural or intraspinal abscess); bleeding (  i.e.: epidural hematoma, subarachnoid hemorrhage, or any other type of intraspinal or peri-dural bleeding); organ or nerve damage (i.e.: Any type of peripheral nerve, nerve root, or spinal cord injury) with subsequent damage to sensory, motor, and/or autonomic systems, resulting in permanent pain, numbness, and/or weakness of one or several areas of the body; allergic reactions; (i.e.: anaphylactic reaction); and/or death. Furthermore, the patient was informed of those risks and complications associated with the medications. These include, but are not limited to: allergic reactions (i.e.: anaphylactic or anaphylactoid reaction(s)); adrenal axis suppression; blood sugar elevation that in diabetics may result in ketoacidosis or comma; water retention that in patients with history of congestive heart failure may result in shortness of breath, pulmonary edema, and decompensation with resultant heart failure; weight gain; swelling or edema; medication-induced neural toxicity; particulate matter embolism and blood vessel occlusion with resultant organ, and/or nervous system infarction; and/or aseptic necrosis of one or more joints. Finally, the patient was informed that Medicine is not an exact science; therefore, there is also the possibility of unforeseen or unpredictable risks and/or possible complications that may result in a catastrophic outcome. The patient indicated having understood very clearly. We have given the patient no guarantees and we have made no promises. Enough time was given to the patient to ask questions, all of which were answered to the patient's satisfaction. Monique [name] has indicated that she wanted to continue with the procedure. Attestation: I, the ordering provider, attest that I  have discussed with the patient the benefits, risks, side-effects, alternatives, likelihood of achieving goals, and potential problems during recovery for the procedure that I have provided informed consent. Date  Time: 06/29/2019 11:01 AM  Pre-Procedure Preparation:  Monitoring: As per clinic protocol. Respiration, ETCO2, SpO2, BP, heart rate and rhythm monitor placed and checked for adequate function Safety Precautions: Patient was assessed for positional comfort and pressure points before starting the procedure. Time-out: I initiated and conducted the "Time-out" before starting the procedure, as per protocol. The patient was asked to participate by confirming the accuracy of the "Time Out" information. Verification of the correct person, site, and procedure were performed and confirmed by me, the nursing staff, and the patient. "Time-out" conducted as per Joint Commission's Universal Protocol (UP.01.01.01). Time: 1114  Description of Procedure #1:  Target Area: The inferior and lateral portion of the pedicle, just lateral to a line created by the 6:00 position of the pedicle and the superior articular process of the vertebral body below. On the lateral view, this target lies just posterior to the anterior aspect of the lamina and posterior to the midpoint created between the anterior and the posterior aspect of the neural foramina. Approach: Posterior paravertebral approach. Area Prepped: Entire Posterior Lumbosacral Region Prepping solution: DuraPrep (Iodine Povacrylex [0.7% available iodine] and Isopropyl Alcohol, 74% w/w) Safety Precautions: Aspiration looking for blood return was conducted prior to all injections. At no point did we inject any substances, as a needle was being advanced. No attempts were made at seeking any paresthesias. Safe injection practices and needle disposal techniques used. Medications properly checked for expiration dates. SDV (single dose vial) medications  used.  Description of the Procedure: Protocol guidelines were followed. The patient was placed in position over the fluoroscopy table. The target area was identified and the area prepped in the usual manner. Skin & deeper tissues infiltrated with local anesthetic. Appropriate amount of time allowed to pass for local anesthetics to take effect. The procedure needles were then advanced to the target area. Proper  needle placement secured. Negative aspiration confirmed. Solution injected in intermittent fashion, asking for systemic symptoms every 0.2cc of injectate. The needles were then removed and the area cleansed, making sure to leave some of the prepping solution back to take advantage of its long term bactericidal properties.  Start Time: 1114 hrs.  Materials:  Needle(s) Type: Spinal Needle Gauge: 22G Length: 3.5-in Medication(s): Please see orders for medications and dosing details.  Description of Procedure #2:  Target Area: The  interlaminar space, initially targeting the lower border of the superior vertebral body lamina. Approach: Posterior paramedial approach. Area Prepped: Same as above Prepping solution: Same as above Safety Precautions: Same as above  Description of the Procedure: Protocol guidelines were followed. The patient was placed in position over the fluoroscopy table. The target area was identified and the area prepped in the usual manner. Skin & deeper tissues infiltrated with local anesthetic. Appropriate amount of time allowed to pass for local anesthetics to take effect. The procedure needle was introduced through the skin, ipsilateral to the reported pain, and advanced to the target area. Bone was contacted and the needle walked caudad, until the lamina was cleared. The ligamentum flavum was engaged and loss-of-resistance technique used as the epidural needle was advanced. The epidural space was identified using "loss-of-resistance technique" with 2-3 ml of PF-NaCl (0.9%  NSS), in a 5cc LOR glass syringe. Proper needle placement secured. Negative aspiration confirmed. Solution injected in intermittent fashion, asking for systemic symptoms every 0.5cc of injectate. The needles were then removed and the area cleansed, making sure to leave some of the prepping solution back to take advantage of its long term bactericidal properties.  Vitals:   06/29/19 1125 06/29/19 1135 06/29/19 1145 06/29/19 1155  BP: (!) 105/48 (!) 106/57 96/64 102/63  Pulse:      Resp: 19 20 19 18   Temp:  (!) 97.5 F (36.4 C)  (!) 97.4 F (36.3 C)  TempSrc:      SpO2: 96% 97% 98% 98%  Weight:      Height:        End Time: 1125 hrs.  Materials:  Needle(s) Type: Epidural needle Gauge: 17G Length: 3.5-in Medication(s): Please see orders for medications and dosing details.  Imaging Guidance (Spinal):          Type of Imaging Technique: Fluoroscopy Guidance (Spinal) Indication(s): Assistance in needle guidance and placement for procedures requiring needle placement in or near specific anatomical locations not easily accessible without such assistance. Exposure Time: Please see nurses notes. Contrast: Before injecting any contrast, we confirmed that the patient did not have an allergy to iodine, shellfish, or radiological contrast. Once satisfactory needle placement was completed at the desired level, radiological contrast was injected. Contrast injected under live fluoroscopy. No contrast complications. See chart for type and volume of contrast used. Fluoroscopic Guidance: I was personally present during the use of fluoroscopy. "Tunnel Vision Technique" used to obtain the best possible view of the target area. Parallax error corrected before commencing the procedure. "Direction-depth-direction" technique used to introduce the needle under continuous pulsed fluoroscopy. Once target was reached, antero-posterior, oblique, and lateral fluoroscopic projection used confirm needle placement in all  planes. Images permanently stored in EMR. Interpretation: I personally interpreted the imaging intraoperatively. Adequate needle placement confirmed in multiple planes. Appropriate spread of contrast into desired area was observed. No evidence of afferent or efferent intravascular uptake. No intrathecal or subarachnoid spread observed. Permanent images saved into the patient's record.  Antibiotic Prophylaxis:   Anti-infectives (From  admission, onward)   None     Indication(s): None identified  Post-operative Assessment:  Post-procedure Vital Signs:  Pulse/HCG Rate: 8380 Temp: (!) 97.4 F (36.3 C) Resp: 18 BP: 102/63 SpO2: 98 %  EBL: None  Complications: No immediate post-treatment complications observed by team, or reported by patient.   Note: The patient tolerated the entire procedure well. A repeat set of vitals were taken after the procedure and the patient was kept under observation following institutional policy, for this type of procedure. Post-procedural neurological assessment was performed, showing return to baseline, prior to discharge. The patient was provided with post-procedure discharge instructions, including a section on how to identify potential problems. Should any problems arise concerning this procedure, the patient was given instructions to immediately contact us, at any time, without hesitation. In any case, we plan to contact the patient by telephone for a follow-up status report regarding this interventional procedure.  Comments:  Very difficult to accomplish procedure due to patient's lack of intraoperative compliance with request not to move.  The patient kept lifting her hand, moving her legs, and moving from side to side during the entire procedure, increasing the risk of puncturing a nontargeted structure.  Plan of Care  Orders:  Orders Placed This Encounter  Procedures  . Lumbar Transforaminal Epidural    Scheduling Instructions:     Side: Right-sided      Level: L4     Sedation: With Sedation.     Timeframe: Today    Order Specific Question:   Where will this procedure be performed?    Answer:   [facility]  . Lumbar Epidural Injection    Scheduling Instructions:     Procedure: Interlaminar LESI L3-4     Laterality: Right-sided     Sedation: Patient's choice     Timeframe:  Today    Order Specific Question:   Where will this procedure be performed?    Answer:   [facility]  . DG PAIN [facility]IN[facility] C-ARM 1-60 MIN NO REPORT    Intraoperative interpretation by procedural physician at [place].    Standing Status:   Standing    Number of Occurrences:   1    Order Specific Question:   Reason for exam:    Answer:   Assistance in needle guidance and placement for procedures requiring needle placement in or near specific anatomical locations not easily accessible without such assistance.  . Informed Consent Details: Physician/Practitioner Attestation; Transcribe to consent form and obtain patient signature    Provider Attestation: I, Cavour Dossie Arbour, MD, (Pain Management Specialist), the physician/practitioner, attest that I have discussed with the patient the benefits, risks, side effects, alternatives, likelihood of achieving goals and potential problems during recovery for the procedure that I have provided informed consent.    Scheduling Instructions:     Procedure: Diagnostic lumbar transforaminal epidural steroid injection under fluoroscopic guidance. 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 Tamala Julian, before procedure.     Transcribe to consent form and obtain patient signature.  . Informed Consent Details: Physician/Practitioner Attestation; Transcribe to consent form and obtain patient signature    Provider Attestation: I, Olean Dossie Arbour, MD, (Pain Management  Specialist), the physician/practitioner, attest that I have discussed with the patient the benefits, risks, side effects, alternatives, likelihood of achieving goals and potential problems during recovery for the procedure that  I have provided informed consent.    Scheduling Instructions:     Procedure: Lumbar epidural steroid injection under fluoroscopic guidance     Indications: Low back and/or lower extremity pain secondary to lumbar radiculitis     Note: Always confirm laterality of pain with Ms. Tamala Julian, before procedure.     Transcribe to consent form and obtain patient signature.  . Provide equipment / supplies at bedside    Equipment required: Single use, disposable, "Epidural Tray" Epidural Catheter: NOT required    Standing Status:   Standing    Number of Occurrences:   1    Order Specific Question:   Specify    Answer:   Epidural Tray   Chronic Opioid Analgesic:  No opioid analgesics prescribed by our practice. See 05/13/2018 UDS (+) Undeclared THC.   Medications ordered for procedure: Meds ordered this encounter  Medications  . iohexol (OMNIPAQUE) 180 MG/ML injection 10 mL    Must be Myelogram-compatible. If not available, you may substitute with a water-soluble, non-ionic, hypoallergenic, myelogram-compatible radiological contrast medium.  Marland Kitchen lidocaine (XYLOCAINE) 2 % (with pres) injection 400 mg  . lactated ringers infusion 1,000 mL  . midazolam (VERSED) 5 MG/5ML injection 1-2 mg    Make sure Flumazenil is available in the pyxis when using this medication. If oversedation occurs, administer 0.2 mg IV over 15 sec. If after 45 sec no response, administer 0.2 mg again over 1 min; may repeat at 1 min intervals; not to exceed 4 doses (1 mg)  . fentaNYL (SUBLIMAZE) injection 25-50 mcg    Make sure Narcan is available in the pyxis when using this medication. In the event of respiratory depression (RR< 8/min): Titrate NARCAN (naloxone) in increments of 0.1 to 0.2 mg IV at 2-3 minute  intervals, until desired degree of reversal.  . triamcinolone acetonide (KENALOG-40) injection 40 mg  . ropivacaine (PF) 2 mg/mL (0.2%) (NAROPIN) injection 2 mL  . sodium chloride flush (NS) 0.9 % injection 2 mL  . dexamethasone (DECADRON) injection 10 mg  . ropivacaine (PF) 2 mg/mL (0.2%) (NAROPIN) injection 1 mL  . sodium chloride flush (NS) 0.9 % injection 1 mL   Medications administered: We administered iohexol, lidocaine, lactated ringers, midazolam, fentaNYL, triamcinolone acetonide, ropivacaine (PF) 2 mg/mL (0.2%), sodium chloride flush, dexamethasone, ropivacaine (PF) 2 mg/mL (0.2%), and sodium chloride flush.  See the medical record for exact dosing, route, and time of administration.  Follow-up plan:   Return in about 2 weeks (around 07/13/2019) for (VV), (PP).       Interventional management options: Planned, scheduled, and/or pending:   None at this time.   Considering:   Diagnostic/therapeutic right L3-4 LESI #3  Diagnostic/therapeutic right L4 TFESI #3  Diagnostic bilateral lumbar facet nerve block #1 Possible bilateral lumbar facet RFA   Palliative PRN treatment(s):   Palliative right L4 TFESI + right L3-4 LESI(IV sedation preferred)    Recent Visits No visits were found meeting these conditions.  Showing recent visits within past 90 days and meeting all other requirements   Today's Visits Date Type Provider Dept  06/29/19 Procedure visit [name], MD [facility]  Showing today's visits and meeting all other requirements   Future Appointments Date Type Provider Dept  07/19/19 Appointment [name], MD [facility]  Showing future appointments within next 90 days and meeting all other requirements   Disposition: Discharge home  Discharge Date & Time: 06/29/2019; 1200 hrs.   Primary Care Physician: [name], MD Location: [facility] Note by: Gaspar Cola, MD Date:  06/29/2019; Time: 12:21 PM  Disclaimer:  Medicine is not an Chief Strategy Officer. The only guarantee in medicine is that nothing is guaranteed. It is important to note that the decision to proceed with this intervention was based on the information collected from the patient. The Data and conclusions were drawn from the patient's questionnaire, the interview, and the physical examination. Because the information was provided in large part by the patient, it cannot be guaranteed that it has not been purposely or unconsciously manipulated. Every effort has been made to obtain as much relevant data as possible for this evaluation. It is important to note that the conclusions that lead to this procedure are derived in large part from the available data. Always take into account that the treatment will also be dependent on availability of resources and existing treatment guidelines, considered by other Pain Management Practitioners as being common knowledge and practice, at the time of the intervention. For Medico-Legal purposes, it is also important to point out that variation in procedural techniques and pharmacological choices are the acceptable norm. The indications, contraindications, technique, and results of the above procedure should only be interpreted and judged by a Board-Certified Interventional Pain Specialist with extensive familiarity and expertise in the same exact procedure and technique.

## 2019-06-30 ENCOUNTER — Telehealth: Payer: Self-pay

## 2019-06-30 NOTE — Telephone Encounter (Signed)
Pt was called and no problem reported. °

## 2019-07-05 DIAGNOSIS — R519 Headache, unspecified: Secondary | ICD-10-CM | POA: Diagnosis not present

## 2019-07-05 DIAGNOSIS — R202 Paresthesia of skin: Secondary | ICD-10-CM | POA: Diagnosis not present

## 2019-07-06 ENCOUNTER — Telehealth: Payer: Self-pay

## 2019-07-06 NOTE — Telephone Encounter (Signed)
Copied from [place] [id number]. Topic: General - Other >> Jul 06, 2019 11:43 AM Carolyn Stare wrote: Pt req a referral to see  dermatologist for her mouth

## 2019-07-07 NOTE — Telephone Encounter (Signed)
Pt is scheduled °

## 2019-07-08 DIAGNOSIS — H524 Presbyopia: Secondary | ICD-10-CM | POA: Diagnosis not present

## 2019-07-09 ENCOUNTER — Other Ambulatory Visit: Payer: Self-pay

## 2019-07-09 ENCOUNTER — Ambulatory Visit (INDEPENDENT_AMBULATORY_CARE_PROVIDER_SITE_OTHER): Payer: BC Managed Care – PPO | Admitting: Family Medicine

## 2019-07-09 ENCOUNTER — Encounter: Payer: Self-pay | Admitting: Family Medicine

## 2019-07-09 DIAGNOSIS — Z23 Encounter for immunization: Secondary | ICD-10-CM

## 2019-07-09 DIAGNOSIS — G500 Trigeminal neuralgia: Secondary | ICD-10-CM | POA: Diagnosis not present

## 2019-07-09 MED ORDER — LIDOCAINE-PRILOCAINE 2.5-2.5 % EX CREA: 1.0000 "application " | TOPICAL_CREAM | CUTANEOUS | 1 refills | Status: DC | PRN
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
## 2019-07-15 ENCOUNTER — Encounter: Payer: Self-pay | Admitting: Pain Medicine

## 2019-07-15 ENCOUNTER — Telehealth: Payer: Self-pay | Admitting: *Deleted

## 2019-07-15 NOTE — Progress Notes (Signed)
Questions asked to patient:  1. About 15 minutes after the procedure, while the area was still numb from the local anesthetics, were you having any pain in the area? 2. How many days was the area numb? 3. How much better is your pain now, when compared to before the procedure? 4. Did you have any problems with the procedure? (Side-effects/Complications) 5. What got better? 6. What area did not get better? Pain relief after procedure (treated area only):  First 1 hour: 100% (No pain)   Initial 4-6 hours: 100% (No pain)   Any benefit longer than 6 hours: 100% (No pain)   Current benefit: 50%   Improvement in ROM (Range of Motion):  no issues    Improvement in function:  when she first stands up she is not as stiff as she was before.  the tightness behind knees is better.  continues shotting pain down the legs, starting from buttocks at times but not always.

## 2019-07-15 NOTE — Telephone Encounter (Signed)
Attempted to call for pre appointment review of meds/allergies. Message left. 

## 2019-07-19 ENCOUNTER — Other Ambulatory Visit: Payer: Self-pay

## 2019-07-19 ENCOUNTER — Ambulatory Visit: Payer: Medicaid Other | Attending: Pain Medicine | Admitting: Pain Medicine

## 2019-07-19 ENCOUNTER — Telehealth: Payer: Self-pay | Admitting: *Deleted

## 2019-07-19 DIAGNOSIS — M7918 Myalgia, other site: Secondary | ICD-10-CM | POA: Diagnosis not present

## 2019-07-19 DIAGNOSIS — G500 Trigeminal neuralgia: Secondary | ICD-10-CM | POA: Diagnosis not present

## 2019-07-19 DIAGNOSIS — M5416 Radiculopathy, lumbar region: Secondary | ICD-10-CM

## 2019-07-19 DIAGNOSIS — M792 Neuralgia and neuritis, unspecified: Secondary | ICD-10-CM | POA: Diagnosis not present

## 2019-07-19 DIAGNOSIS — G8929 Other chronic pain: Secondary | ICD-10-CM

## 2019-07-19 DIAGNOSIS — M79605 Pain in left leg: Secondary | ICD-10-CM

## 2019-07-19 DIAGNOSIS — M5126 Other intervertebral disc displacement, lumbar region: Secondary | ICD-10-CM

## 2019-07-19 DIAGNOSIS — M5442 Lumbago with sciatica, left side: Secondary | ICD-10-CM | POA: Diagnosis not present

## 2019-07-19 DIAGNOSIS — M79604 Pain in right leg: Secondary | ICD-10-CM

## 2019-07-19 DIAGNOSIS — M5136 Other intervertebral disc degeneration, lumbar region: Secondary | ICD-10-CM

## 2019-07-19 DIAGNOSIS — M5441 Lumbago with sciatica, right side: Secondary | ICD-10-CM | POA: Diagnosis not present

## 2019-07-19 MED ORDER — TIZANIDINE HCL 2 MG PO TABS: 2.0000 mg | ORAL_TABLET | Freq: Three times a day (TID) | ORAL | 5 refills | Status: DC | PRN

## 2019-07-19 MED ORDER — GABAPENTIN 100 MG PO CAPS: ORAL_CAPSULE | ORAL | 0 refills | Status: DC

## 2019-07-19 NOTE — Progress Notes (Signed)
Pain Management Virtual Encounter Note - Virtual Visit via Telephone Telehealth (real-time audio visits between healthcare provider and patient).   Patient's Phone No. & Preferred Pharmacy:  [phone] (home); [phone] (mobile); (Preferred) [phone] [email or web address]  [id number] - [place], [place] - [address] [place] [address] Phone: [phone] Fax: [phone]    Pre-screening note:  Our staff contacted Monique [name] and offered her an "in person", "face-to-face" appointment versus a telephone encounter. She indicated preferring the telephone encounter, at this time.   Reason for Virtual Visit: COVID-19*  Social distancing based on CDC and AMA recommendations.   I contacted Monique [name] on 07/19/2019 via telephone.      I clearly identified myself as Gaspar Cola, MD. I verified that I was speaking with the correct person using two identifiers (Name: Monique [name], and date of birth: Jul 16, 1971).  Advanced Informed Consent I sought verbal advanced consent from Monique [name] for virtual visit interactions. I informed Monique [name] of possible security and privacy concerns, risks, and limitations associated with providing "not-in-person" medical evaluation and management services. I also informed Monique [name] of the availability of "in-person" appointments. Finally, I informed her that there would be a charge for the virtual visit and that she could be  personally, fully or partially, financially responsible for it. Monique [name] expressed understanding and agreed to proceed.   Historic Elements   Monique [name] is a 48 y.o. year old, female patient evaluated today after her last encounter by our practice on 07/15/2019. Monique [name]  has a past medical history of Anxiety, Bursitis, Cervical cancer ([place]), Cervical intraepithelial neoplasia I, Chronic lymphocytic leukemia ([place]) (2018), Depression, Eating disorder, History of self-harm,  Insomnia, Obsession, Pap smear abnormality of cervix with LGSIL, Tobacco abuse, and Vitamin B12 deficiency (non anemic). She also  has a past surgical history that includes Cervical biopsy w/ loop electrode excision; Tubal ligation; Breast biopsy (Right, 01/05/2018); OTHER SURGICAL HISTORY; and Colonoscopy with propofol (N/A, 04/21/2019). Monique [name] has a current medication list which includes the following prescription(s): albuterol, atorvastatin, trelegy ellipta, gabapentin, imbruvica, lamotrigine, lidocaine-prilocaine, loratadine, magnesium, metronidazole, mupirocin ointment, omeprazole, sertraline, trazodone, triamcinolone cream, gnp calcium 1200, meloxicam, and tizanidine. She  reports that she has been smoking cigarettes. She started smoking about 32 years ago. She has a 15.50 pack-year smoking history. She has never used smokeless tobacco. She reports current alcohol use. She reports current drug use. Drug: Marijuana. Monique [name] has No Known Allergies.   HPI  Today, she is being contacted for both, medication management and a post-procedure assessment.  The patient indicates doing well with the current medication regimen. No adverse reactions or side effects reported to the medications.  The patient indicates having a 50% relief of her pain when compared to prior to the procedure.  Currently the pain is in the lower back and lower extremities with the lower extremity being worse than the back.  He she has bilateral lower extremity pain with the left being worse than the right.  In both cases, the pain goes down to the shin area running through the back of the leg turning into the side and then into the front, following a dermatomal pattern.  The pain is no longer going all the way into the foot in the case of the right lower extremity it is intermittent compared to prior to the procedure.  She is no longer having the pain behind the knee but in the right leg the  pain is primarily in the buttocks area while in  the left it goes all the way down to her shin.  She does have an older MRI that indicates her having an L4-5 disc protrusion and today she has indicated that her pains are worse than when she had that MRI.  Because of this reason we will go ahead and have it repeated and compare the 2 to see what has changed.  If need be, we may repeat the lumbar ESI, either interlaminar or transforaminal, depending on what the MRI shows.  Post-Procedure Evaluation  Procedure: Therapeutic right-sided L4 transforaminal epidural steroid injection #2+ right-sided L3-4 LESI #2 under fluoroscopic guidance and IV sedation Pre-procedure pain level:  5/10 Post-procedure: 0/10 (100% relief)  Sedation: Please see nurses note.  Effectiveness during initial hour after procedure(Ultra-Short Term Relief):   100%  Local anesthetic used: Long-acting (4-6 hours) Effectiveness: Defined as any analgesic benefit obtained secondary to the administration of local anesthetics. This carries significant diagnostic value as to the etiological location, or anatomical origin, of the pain. Duration of benefit is expected to coincide with the duration of the local anesthetic used.  Effectiveness during initial 4-6 hours after procedure(Short-Term Relief):   100%  Long-term benefit: Defined as any relief past the pharmacologic duration of the local anesthetics.  Effectiveness past the initial 6 hours after procedure(Long-Term Relief):   100%  Current benefits: Defined as benefit that persist at this time.   Analgesia:  50% improved Function: Somewhat improved ROM: Somewhat improved  Pharmacotherapy Assessment  Analgesic: No opioid analgesics prescribed by our practice. See 05/13/2018 UDS (+) Undeclared THC.   Monitoring: Pharmacotherapy: No side-effects or adverse reactions reported. [place] PMP: PDMP reviewed during this encounter.       Compliance: No problems identified. Effectiveness: Clinically acceptable. Plan: Refer to  "POC".  UDS:  Summary  Date Value Ref Range Status  05/13/2018 FINAL  Final    Comment:    ==================================================================== TOXASSURE COMP DRUG ANALYSIS,UR ==================================================================== Test                             Result       Flag       Units Drug Present and Declared for Prescription Verification   Gabapentin                     PRESENT      EXPECTED Drug Present not Declared for Prescription Verification   Carboxy-THC                    213          UNEXPECTED ng/mg creat    Carboxy-THC is a metabolite of tetrahydrocannabinol  (THC).    Source of [facility] is most commonly illicit, but THC is also present    in a scheduled prescription medication.   Naproxen                       PRESENT      UNEXPECTED Drug Absent but Declared for Prescription Verification   Hydrocodone                    Not Detected UNEXPECTED ng/mg creat   Tramadol                       Not Detected UNEXPECTED ng/mg creat   Citalopram  Not Detected UNEXPECTED   Prochlorperazine               Not Detected UNEXPECTED   Acetaminophen                  Not Detected UNEXPECTED    Acetaminophen, as indicated in the declared medication list, is    not always detected even when used as directed. ==================================================================== Test                      Result    Flag   Units      Ref Range   Creatinine              218              mg/dL      >=20 ==================================================================== Declared Medications:  The flagging and interpretation on this report are based on the  following declared medications.  Unexpected results may arise from  inaccuracies in the declared medications.  **Note: The testing scope of this panel includes these medications:  Escitalopram (Lexapro)  Gabapentin (Neurontin)  Hydrocodone (Norco)  Prochlorperazine (Compazine)  Tramadol  (Ultram)  **Note: The testing scope of this panel does not include small to  moderate amounts of these reported medications:  Acetaminophen (Norco)  **Note: The testing scope of this panel does not include following  reported medications:  Umeclidinium (Anoro Ellipta)  Varenicline (Chantix)  Vilanterol (Anoro Ellipta) ==================================================================== For clinical consultation, please call [phone]. ====================================================================    Laboratory Chemistry Profile (12 mo)  Renal: 04/30/2019: BUN 15; Creatinine, Ser 0.72  Lab Results  Component Value Date   GFRAA >60 04/30/2019   GFRNONAA >60 04/30/2019   Hepatic: 04/30/2019: Albumin 4.0 Lab Results  Component Value Date   AST 17 04/30/2019   ALT 11 04/30/2019   Other: 08/21/2018: CRP 0.7; Sed Rate 2 Note: Above Lab results reviewed.  Imaging  Last 90 days:  Ct Soft Tissue Neck W Contrast  Result Date: 04/30/2019 CLINICAL DATA:  Chronic lymphocytic leukemia restaging. Ongoing chemotherapy. EXAM: CT NECK WITH CONTRAST TECHNIQUE: Multidetector CT imaging of the neck was performed using the standard protocol following the bolus administration of intravenous contrast. CONTRAST:  143mL OMNIPAQUE IOHEXOL 300 MG/ML  SOLN COMPARISON:  10/22/2018 FINDINGS: Pharynx and larynx: No evidence of mass or swelling. Patent airway. No retropharyngeal fluid. Salivary glands: No inflammation, mass, or stone. Thyroid: Unremarkable. Lymph nodes: Small residual lymph nodes in the neck bilaterally have further decreased in size and all measure 5 mm or less in short axis. There is no evidence of new lymphadenopathy in the neck. Vascular: Major vascular structures of the neck are patent. Limited intracranial: Unremarkable. Visualized orbits: Unremarkable. Mastoids and visualized paranasal sinuses: Or suspicious osseous lesion clear. Skeleton: No acute osseous abnormality. Upper chest:  Chest findings, including subpectoral and axillary lymph nodes, are reported separately. Other: None. IMPRESSION: Further decrease in size of small residual cervical lymph nodes. No new findings in the neck. Electronically Signed   By: Logan Bores M.D.   On: 04/30/2019 19:09   Ct Chest W Contrast  Result Date: 04/30/2019 CLINICAL DATA:  Chronic lymphocytic leukemia restaging EXAM: CT CHEST, ABDOMEN, AND PELVIS WITH CONTRAST TECHNIQUE: Multidetector CT imaging of the chest, abdomen and pelvis was performed following the standard protocol during bolus administration of intravenous contrast. CONTRAST:  122mL OMNIPAQUE IOHEXOL 300 MG/ML  SOLN COMPARISON:  10/22/2018 FINDINGS: CT CHEST FINDINGS Cardiovascular: Atherosclerotic calcification of the aortic arch. Mediastinum/Nodes: Right  paratracheal node 0.7 cm in short axis on image 24/3, formerly 1.0 cm. Left axillary node 1.0 cm in short axis on image 19/3, formerly 1.3 cm. Similar reduction in other axillary and subpectoral lymph nodes Lungs/Pleura: Centrilobular emphysema. Stable 4 mm in diameter right upper lobe subpleural nodule on image 44/4. Stable small subpleural lymph node along the minor fissure on images 84-80 5/4. Stable 3 by 4 mm right middle lobe pulmonary nodule, image 91/4. Stable 3 mm left lower lobe nodule on image 109/4. Musculoskeletal: Unremarkable CT ABDOMEN PELVIS FINDINGS Hepatobiliary: No significant liver lesions. Gallbladder unremarkable. Pancreas: Unremarkable Spleen: Unremarkable.  No splenomegaly. Adrenals/Urinary Tract: 1.0 by 1.7 cm left adrenal mass, stable from 01/21/2017, previously characterized as adenoma. The kidneys appear unremarkable. Stomach/Bowel: Unremarkable Vascular/Lymphatic: Aortoiliac atherosclerotic vascular disease. Improved adenopathy in the abdomen/pelvis. Indistinctly marginated left para-aortic adenopathy measures up to 2.1 cm in conglomerate thickness on image 82/3, formerly 3.1 cm. A right pericaval node  measures 1.0 cm in short axis on image 90/3, formerly 1.5 cm. Left external iliac node 1.1 cm in short axis on image 116/3, formerly 1.5 cm. Right pelvic sidewall lymph node 1.1 cm in short axis on image 113/3, formerly 1.3 cm. Left mesenteric lymph node 0.9 cm in short axis on image 86/3, formerly 1.4 cm. Reproductive: Small cyst or follicle of the left ovary. Uterus unremarkable. Other: No supplemental non-categorized findings. Musculoskeletal: Broad right transverse process of L5 pseudo articulates with the sacrum and is partially fused with the sacrum. IMPRESSION: 1. Further improvement in adenopathy in the chest, abdomen, and pelvis. Pathologic adenopathy has not completely resolved but is significantly improved at all levels. 2. Other imaging findings of potential clinical significance: Aortic Atherosclerosis (ICD10-I70.0) and Emphysema (ICD10-J43.9). Stable small pulmonary nodules in the 3-4 mm range, likely benign. Stable left adrenal adenoma. Electronically Signed   By: Van Clines M.D.   On: 04/30/2019 13:02   Ct Abdomen Pelvis W Contrast  Result Date: 04/30/2019 CLINICAL DATA:  Chronic lymphocytic leukemia restaging EXAM: CT CHEST, ABDOMEN, AND PELVIS WITH CONTRAST TECHNIQUE: Multidetector CT imaging of the chest, abdomen and pelvis was performed following the standard protocol during bolus administration of intravenous contrast. CONTRAST:  12mL OMNIPAQUE IOHEXOL 300 MG/ML  SOLN COMPARISON:  10/22/2018 FINDINGS: CT CHEST FINDINGS Cardiovascular: Atherosclerotic calcification of the aortic arch. Mediastinum/Nodes: Right paratracheal node 0.7 cm in short axis on image 24/3, formerly 1.0 cm. Left axillary node 1.0 cm in short axis on image 19/3, formerly 1.3 cm. Similar reduction in other axillary and subpectoral lymph nodes Lungs/Pleura: Centrilobular emphysema. Stable 4 mm in diameter right upper lobe subpleural nodule on image 44/4. Stable small subpleural lymph node along the minor fissure on  images 84-80 5/4. Stable 3 by 4 mm right middle lobe pulmonary nodule, image 91/4. Stable 3 mm left lower lobe nodule on image 109/4. Musculoskeletal: Unremarkable CT ABDOMEN PELVIS FINDINGS Hepatobiliary: No significant liver lesions. Gallbladder unremarkable. Pancreas: Unremarkable Spleen: Unremarkable.  No splenomegaly. Adrenals/Urinary Tract: 1.0 by 1.7 cm left adrenal mass, stable from 01/21/2017, previously characterized as adenoma. The kidneys appear unremarkable. Stomach/Bowel: Unremarkable Vascular/Lymphatic: Aortoiliac atherosclerotic vascular disease. Improved adenopathy in the abdomen/pelvis. Indistinctly marginated left para-aortic adenopathy measures up to 2.1 cm in conglomerate thickness on image 82/3, formerly 3.1 cm. A right pericaval node measures 1.0 cm in short axis on image 90/3, formerly 1.5 cm. Left external iliac node 1.1 cm in short axis on image 116/3, formerly 1.5 cm. Right pelvic sidewall lymph node 1.1 cm in short axis on image 113/3,  formerly 1.3 cm. Left mesenteric lymph node 0.9 cm in short axis on image 86/3, formerly 1.4 cm. Reproductive: Small cyst or follicle of the left ovary. Uterus unremarkable. Other: No supplemental non-categorized findings. Musculoskeletal: Broad right transverse process of L5 pseudo articulates with the sacrum and is partially fused with the sacrum. IMPRESSION: 1. Further improvement in adenopathy in the chest, abdomen, and pelvis. Pathologic adenopathy has not completely resolved but is significantly improved at all levels. 2. Other imaging findings of potential clinical significance: Aortic Atherosclerosis (ICD10-I70.0) and Emphysema (ICD10-J43.9). Stable small pulmonary nodules in the 3-4 mm range, likely benign. Stable left adrenal adenoma. Electronically Signed   By: Van Clines M.D.   On: 04/30/2019 13:02   [facility] C-arm 1-60 Min No Report  Result Date: 06/29/2019 Fluoro was used, but no Radiologist interpretation will be provided.  Please refer to "NOTES" tab for provider progress note.   Assessment  The primary encounter diagnosis was Chronic lower extremity pain (Primary Area of Pain) (Bilateral) (R>L). 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  Plan of Care  I have discontinued Fujiko L. Sivils's hydrOXYzine. I have also changed her gabapentin. Additionally, I am having her maintain her meloxicam, GNP Calcium 1200, sertraline, traZODone, mupirocin ointment, metroNIDAZOLE, triamcinolone cream, atorvastatin, Trelegy Ellipta, loratadine, Imbruvica, lamoTRIgine, omeprazole, Magnesium, albuterol, lidocaine-prilocaine, and tiZANidine.  Pharmacotherapy (Medications Ordered): Meds ordered this encounter  Medications  . tiZANidine (ZANAFLEX) 2 MG tablet    Sig: Take 1 tablet (2 mg total) by mouth every 8 (eight) hours as needed for muscle spasms.    Dispense:  90 tablet    Refill:  5    Fill one day early if pharmacy is closed on scheduled refill date. May substitute for generic if available.  . gabapentin (NEURONTIN) 100 MG capsule    Sig: Take 1 capsule (100 mg total) by mouth 3 (three) times daily for 15 days, THEN 1 capsule (100 mg total) 4 (four) times daily for 15 days. Follow titration schedule.Marland Kitchen    Dispense:  105 capsule    Refill:  0    Fill one day early if pharmacy is closed on scheduled refill date. May substitute for generic if available.   Orders:  Orders Placed This Encounter  Procedures  . MR LUMBAR SPINE WO CONTRAST    In addition to any acute findings, please report on degenerative changes related to: (Please specify level(s)) (1) ROM & instability (>74mm displacement) (2) Facet joint  (Zygoapophyseal Joint) (3) DDD and/or IVDD (4) Pars defects (5) Previous surgical changes (Include description of hardware and hardware status, if present) (6) Presence and degree of spondylolisthesis, spondylosis, and/or spondyloarthropathies)  (7) Old Fractures (8) Demineralization (9) Additional bone pathology (10) Stenosis (Central, Lateral Recess, Foraminal) (11) If at all possible, please provide AP diameter (mm) of foraminal and/or central canal.    Standing Status:   Future    Standing Expiration Date:   10/19/2019    Order Specific Question:   What is the patient's sedation requirement?    Answer:   No Sedation    Order Specific Question:   Does the patient have a pacemaker or implanted devices?    Answer:   No    Order Specific Question:   Preferred imaging location?  Answer:   [facility] (table limit-350lbs)    Order Specific Question:   Call Results- Best Contact Number?    Answer:   [phone] ([facility])    Order Specific Question:   Radiology Contrast Protocol - do NOT remove file path    Answer:   [email or web address]   Follow-up plan:   Return in about 1 month (around 08/18/2019) for (VV), (MM) to evaluate gabapentin titration and the results of her lumbar MRI.Marland Kitchen      Interventional management options: Planned, scheduled, and/or pending:   None at this time.   Considering:   Diagnostic/therapeutic right L3-4 LESI #3  Diagnostic/therapeutic right L4 TFESI #3  Diagnostic bilateral lumbar facet nerve block #1 Possible bilateral lumbar facet RFA   Palliative PRN treatment(s):   Palliative right L4 TFESI + right L3-4 LESI(IV sedation preferred)    Recent Visits Date Type Provider Dept  06/29/19 Procedure visit [name], MD [facility]  Showing recent visits within past 90 days and meeting all other requirements   Today's Visits Date Type Provider Dept  07/19/19 Telemedicine [name], MD [facility]  Showing today's visits and meeting all other requirements   Future Appointments No visits were found meeting these conditions.  Showing future appointments within next 90 days and meeting all other requirements   I discussed the assessment and treatment plan with the patient. The patient was provided an opportunity to ask questions and all were answered. The patient agreed with the plan and demonstrated an understanding of the instructions.  Patient advised to call back or seek an in-person evaluation if the symptoms or condition worsens.  Total duration of non-face-to-face encounter: 15 minutes.  Note by: Gaspar Cola, MD Date: 07/19/2019; Time: 12:59 PM  Note: This dictation was prepared with Dragon dictation. Any transcriptional errors that may result from this process are unintentional.  Disclaimer:  * Given the special circumstances of the COVID-19 pandemic, the federal government has announced that the Office for Civil Rights (OCR) will exercise its enforcement discretion and will not impose penalties on physicians using telehealth in the event of noncompliance with regulatory requirements under the [place] (HIPAA) in connection with the good faith provision of telehealth during the XX123456 national public health emergency. ([place])

## 2019-07-19 NOTE — Patient Instructions (Signed)
____________________________________________________________________________________________  Gabapentin Titration  Medication used: Gabapentin (Generic Name) or Neurontin (Brand Name) 100 mg tablets/capsules  Reasons to stop increasing the dose:  Reason 1: You get good relief of symptoms, in which case there is no need to increase the daily dose any further.    Reason 2: You develop some side effects, such as sleeping all of the time, difficulty concentrating, or becoming disoriented, in which case you need to go down on the dose, to the prior level, where you were not experiencing any side effects. Stay on that dose longer, to allow more time for your body to get use it, before attempting to increase it again.   Steps to increase medication: Step 1: Start by taking 1 (one) tablet at bedtime x 7 (seven) days.  Step 2: Increase dose to 2 (two) tablets at bedtime. Stay on this dose x 7 (seven) days.  Step 3: Next increase it to 3 (three) tablets at bedtime. Stay on this dose x another 7 (seven) days.  Step 4: Next, add 1 (one) tablet at noon with lunch. Continue this dose x another 7 (seven) days.  Step 5: Add 1 (one) tablet in the afternoon with dinner. Stay on this dose x another 7 (seven) days.  Step 6: At this point you should be taking the medicine 4 (four) times a day. This daily regimen of taking the medicine 4 (four) times a day, will be maintained from now on. You should not take any doses any sooner than every 6 (six) hours.  Step 7: After 7 (seven) days of taking 3 (three) tablet at bedtime, 1 (one) tablet at noon, 1 (one) tablet in the afternoon, and 1 (one) tablet in the morning, begin taking 2 (two) tablets at noon with lunch. Stay on this dose x another 7 (seven) days.   Step 8: After 7 (seven) days of taking 3 (three) tablet at bedtime, 2 (two) tablets at noon, 1 (one) tablet in the afternoon, and 1 (one) tablet in the morning, begin taking 2 (two) tablets in the afternoon  with dinner. Stay on this dose x another 7 (seven) days.   Step 9: After 7 (seven) days of taking 3 (three) tablet at bedtime, 2 (two) tablets at noon, 2 (two) tablets in the afternoon, and 1 (one) tablet in the morning, begin taking 2 (two) tablets in the morning with breakfast. Stay on this dose x another 7 (seven) days. At this point you should be taking the medicine 4 (four) times a day, or about every 6 (six) hours. This daily regimen of taking the medicine 4 (four) times a day, will be maintained from now on. You should not take any doses any sooner than every 6 (six) hours.  Step 10: After 7 (seven) days of taking 3 (three) tablet at bedtime, 2 (two) tablets at noon, 2 (two) tablets in the afternoon, and 2 (two) tablets in the morning, begin taking 3 (three) tablets at noon with lunch. Stay on this dose x another 7 (seven) days.   Step 11: After 7 (seven) days of taking 3 (three) tablet at bedtime, 3 (three) tablets at noon, 2 (two) tablets in the afternoon, and 2 (two) tablets in the morning, begin taking 3 (three) tablets in the afternoon with dinner. Stay on this dose x another 7 (seven) days.   Step 12: After 7 (seven) days of taking 3 (three) tablet at bedtime, 3 (three) tablets at noon, 3 (three) tablets in the afternoon, and 2 (two)  tablet in the morning, begin taking 3 (three) tablets in the morning with breakfast. Stay on this dose x another 7 (seven) days. At this point you should be taking the medicine 4 (four) times a day, or about every 6 (six) hours. This daily regimen of taking the medicine 4 (four) times a day, will be maintained from now on.   Endpoint: Once you have reached the maximum dose you can tolerate without side-effects, contact your physician so as to evaluate the results of the regimen.   Questions: Feel free to contact us for any questions or problems at [phone] ____________________________________________________________________________________________  

## 2019-07-20 MED FILL — IMBRUVICA 420 MG TAB: 420 | 28 days supply | Qty: 28 | Fill #0

## 2019-07-22 DIAGNOSIS — H5213 Myopia, bilateral: Secondary | ICD-10-CM | POA: Diagnosis not present

## 2019-07-28 ENCOUNTER — Telehealth: Payer: Self-pay

## 2019-07-28 ENCOUNTER — Other Ambulatory Visit: Payer: Self-pay | Admitting: Family Medicine

## 2019-07-28 MED ORDER — SPIRIVA HANDIHALER 18 MCG IN CAPS: 18.0000 ug | ORAL_CAPSULE | Freq: Every day | RESPIRATORY_TRACT | 3 refills | Status: DC

## 2019-07-28 MED ORDER — FLUTICASONE-SALMETEROL 250-50 MCG/DOSE IN AEPB: 1.0000 | INHALATION_SPRAY | Freq: Two times a day (BID) | RESPIRATORY_TRACT | 3 refills | Status: DC

## 2019-07-28 NOTE — Telephone Encounter (Signed)
Patient has Medicaid and does not cover Trelegy. Will cover Advair, Dulera, and Symbicort.

## 2019-07-30 ENCOUNTER — Other Ambulatory Visit: Payer: Self-pay

## 2019-07-30 NOTE — Progress Notes (Signed)
[facility]  [phone] [phone]) [phone]  ID: Monique [name] OB: Mar 29, 1971  MR#: [id number]  VOJ#:[id number]  Patient Care Team: [name], MD as PCP - General (Family Medicine) Lloyd Huger, MD as Consulting Physician (Oncology)  I connected with Monique [name] on 08/03/19 at  1:15 PM EST by video enabled telemedicine visit and verified that I am speaking with the correct person using two identifiers.   I discussed the limitations, risks, security and privacy concerns of performing an evaluation and management service by telemedicine and the availability of in-person appointments. I also discussed with the patient that there may be a patient responsible charge related to this service. The patient expressed understanding and agreed to proceed.   Other persons participating in the visit and their role in the encounter: Patient, MD  Patient's location: Home Provider's location: Clinic  CHIEF COMPLAINT: CLL.  INTERVAL HISTORY: Patient agreed to video enabled telemedicine visit for further evaluation and discussion of her lab results.  She currently feels well and is asymptomatic.  She is tolerating Imbruvica well without significant side effects.  She has no neurologic complaints.  She denies any fevers or night sweats.  She has a good appetite and denies weight loss.  She has no chest pain, shortness of breath, cough, or hemoptysis.  She denies any nausea, vomiting, constipation, or diarrhea.  She has no melena or hematochezia.  She has no urinary complaints.  Patient feels at her baseline offers no specific complaints today.  REVIEW OF SYSTEMS:   Review of Systems  Constitutional: Negative.  Negative for diaphoresis, fever, malaise/fatigue and weight loss.  HENT: Negative.  Negative for congestion.   Respiratory: Negative.  Negative for cough and shortness of breath.   Cardiovascular: Negative.  Negative for chest pain and leg swelling.   Gastrointestinal: Negative for abdominal pain and constipation.  Genitourinary: Negative.  Negative for dysuria and frequency.  Musculoskeletal: Positive for back pain. Negative for myalgias and neck pain.  Skin: Negative.   Neurological: Negative.  Negative for tingling, sensory change, focal weakness and weakness.  Psychiatric/Behavioral: Negative.  Negative for depression and suicidal ideas. The patient is not nervous/anxious.     As per HPI. Otherwise, a complete review of systems is negative.  PAST MEDICAL HISTORY: Past Medical History:  Diagnosis Date  . Anxiety   . Bursitis    leg pain  . Cervical cancer ([place])    hx LEEP over 18 years ago.   . Cervical intraepithelial neoplasia I   . Chronic lymphocytic leukemia ([place]) 2018   Dr Grayland Ormond  . Depression   . Eating disorder   . History of self-harm   . Insomnia   . Obsession   . Pap smear abnormality of cervix with LGSIL   . Tobacco abuse   . Vitamin B12 deficiency (non anemic)     PAST SURGICAL HISTORY: Past Surgical History:  Procedure Laterality Date  . BREAST BIOPSY Right 01/05/2018   US guided biopsy of 2 areas and 1 lymph node, MIXED INFLAMMATION AND GIANT CELL REACTION  . CERVICAL BIOPSY  W/ LOOP ELECTRODE EXCISION    . COLONOSCOPY WITH PROPOFOL N/A 04/21/2019   Procedure: COLONOSCOPY WITH PROPOFOL;  Surgeon: Virgel Manifold, MD;  Location: [facility] ENDOSCOPY;  Service: Gastroenterology;  Laterality: N/A;  . OTHER SURGICAL HISTORY     scar tissue removed from vocal cords  . TUBAL LIGATION      FAMILY HISTORY: Family History  Problem Relation Age of Onset  .  Cancer Mother        thyroid  . Alcohol abuse Father   . Alcohol abuse Brother   . Depression Brother   . Bipolar disorder Brother   . ADD / ADHD Son   . Breast cancer Neg Hx     ADVANCED DIRECTIVES (Y/N):  N  HEALTH MAINTENANCE: Social History   Tobacco Use  . Smoking status: Current Every Day Smoker    Packs/day: 0.50    Years: 31.00     Pack years: 15.50    Types: Cigarettes    Start date: 09/25/1986  . Smokeless tobacco: Never Used  Substance Use Topics  . Alcohol use: Yes    Alcohol/week: 0.0 standard drinks    Comment: rarely  . Drug use: Yes    Types: Marijuana     Colonoscopy:  PAP:  Bone density:  Lipid panel:  No Known Allergies  Current Outpatient Medications  Medication Sig Dispense Refill  . albuterol (VENTOLIN HFA) 108 (90 Base) MCG/ACT inhaler INHALE TWO (2) PUFFS BY MOUTH EVERY 6 HOURS AS NEEDED FOR SHORTNESS OF BREATH AND WHEEZING 8.5 g 0  . atorvastatin (LIPITOR) 10 MG tablet Take 1 tablet (10 mg total) by mouth daily. 90 tablet 1  . gabapentin (NEURONTIN) 100 MG capsule Take 1 capsule (100 mg total) by mouth 3 (three) times daily for 15 days, THEN 1 capsule (100 mg total) 4 (four) times daily for 15 days. Follow titration schedule.. 105 capsule 0  . lamoTRIgine (LAMICTAL) 100 MG tablet Take 1 tablet (100 mg total) by mouth 2 (two) times daily. . 180 tablet 1  . lidocaine-prilocaine (EMLA) cream Apply 1 application topically as needed. 30 g 1  . loratadine (CLARITIN) 10 MG tablet Take 1 tablet (10 mg total) by mouth every morning. 90 tablet 1  . Magnesium 500 MG CAPS Take 1 capsule (500 mg total) by mouth 2 (two) times daily at 8 am and 10 pm. 60 capsule 5  . omeprazole (PRILOSEC) 20 MG capsule TAKE 1 CAPSULE (20 MG TOTAL) BY MOUTH 2 (TWO) TIMES DAILY BEFORE A MEAL. 60 capsule 1  . sertraline (ZOLOFT) 25 MG tablet 75 mg.     Marland Kitchen tiZANidine (ZANAFLEX) 2 MG tablet Take 1 tablet (2 mg total) by mouth every 8 (eight) hours as needed for muscle spasms. 90 tablet 5  . traZODone (DESYREL) 50 MG tablet Take 75 mg by mouth at bedtime as needed.     . triamcinolone cream (KENALOG) 0.1 % Apply 1 application topically 2 (two) times daily. 80 g 0  . Calcium Carbonate-Vit D-Min (GNP CALCIUM 1200) 1200-1000 MG-UNIT CHEW Chew 1,200 mg by mouth daily with breakfast. Take in combination with vitamin D and magnesium. 30  tablet 5  . Fluticasone-Salmeterol (ADVAIR DISKUS) 250-50 MCG/DOSE AEPB Inhale 1 puff into the lungs 2 (two) times daily. 1 each 3  . Ibrutinib (IMBRUVICA) 420 MG TABS Take 1 tablet by mouth daily. 28 tablet 2  . meloxicam (MOBIC) 15 MG tablet Take 1 tablet (15 mg total) by mouth daily. 180 tablet 0  . metroNIDAZOLE (METROCREAM) 0.75 % cream APPLY A THIN LAYER TO FACE TWICE A DAY    . mupirocin ointment (BACTROBAN) 2 % Place 1 application into the nose 2 (two) times daily. (Patient not taking: Reported on 08/02/2019) 22 g 0  . tiotropium (SPIRIVA HANDIHALER) 18 MCG inhalation capsule Place 1 capsule (18 mcg total) into inhaler and inhale daily. 30 capsule 3   No current facility-administered medications for this  visit.     OBJECTIVE: There were no vitals filed for this visit.   There is no height or weight on file to calculate BMI.    ECOG FS:0 - Asymptomatic  General: Well-developed, well-nourished, no acute distress. HEENT: Normocephalic, no obvious lymphadenopathy. Neuro: Alert, answering all questions appropriately. Cranial nerves grossly intact. Psych: Normal affect.  LAB RESULTS:  Lab Results  Component Value Date   NA 137 08/02/2019   K 4.2 08/02/2019   CL 106 08/02/2019   CO2 23 08/02/2019   GLUCOSE 87 08/02/2019   BUN 20 08/02/2019   CREATININE 0.73 08/02/2019   CALCIUM 8.8 (L) 08/02/2019   PROT 6.9 08/02/2019   ALBUMIN 4.3 08/02/2019   AST 15 08/02/2019   ALT 10 08/02/2019   ALKPHOS 57 08/02/2019   BILITOT 0.8 08/02/2019   GFRNONAA >60 08/02/2019   GFRAA >60 08/02/2019    Lab Results  Component Value Date   WBC 12.9 (H) 08/02/2019   NEUTROABS 7.0 08/02/2019   HGB 14.0 08/02/2019   HCT 42.6 08/02/2019   MCV 93.0 08/02/2019   PLT 192 08/02/2019     STUDIES: No results found.  ASSESSMENT: CLL  PLAN:    1. CLL: Confirmed by peripheral blood flow cytometry.  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  CT scan results from April 30, 2019 reviewed independently with continued improvement of patient's known lymphadenopathy.  Patient completed cycle 3 of Rituxan plus Treanda on March 26, 2018, but was noted to have progressive disease therefore initiated 480 mg Imbruvica daily.  Continue current treatment indefinitely until progression of disease or intolerable side effects.  Return to clinic in 3 months with repeat laboratory work and imaging with follow-up 1 to 2 days later. 2.  Leukocytosis: Chronic and unchanged.  White count slightly elevated today.  Continue Imbruvica as above. 3.  Trigeminal neuralgia: Patient does not complain of this today.  Appears to be unrelated to CLL or Imbruvica.  MRI of the brain on Jan 25, 2019 was unremarkable.   4.  Depression/anxiety: Chronic.  Continue follow-up and treatment per primary care. 5.  Back pain: Continue evaluation and treatment per primary care.  I provided 15 minutes of face-to-face video visit time during this encounter, and > 50% was spent counseling as documented under my assessment & plan.   Patient expressed understanding and was in agreement with this plan. She also understands that She can call clinic at any time with any questions, concerns, or complaints.    Lloyd Huger, MD   08/03/2019 2:51 PM

## 2019-08-02 ENCOUNTER — Encounter: Payer: Self-pay | Admitting: Oncology

## 2019-08-02 ENCOUNTER — Inpatient Hospital Stay: Payer: Medicaid Other

## 2019-08-02 ENCOUNTER — Other Ambulatory Visit: Payer: Self-pay

## 2019-08-02 DIAGNOSIS — C9110 Chronic lymphocytic leukemia of B-cell type not having achieved remission: Secondary | ICD-10-CM

## 2019-08-02 DIAGNOSIS — M79602 Pain in left arm: Secondary | ICD-10-CM | POA: Diagnosis not present

## 2019-08-02 DIAGNOSIS — R29898 Other symptoms and signs involving the musculoskeletal system: Secondary | ICD-10-CM | POA: Diagnosis not present

## 2019-08-02 DIAGNOSIS — R200 Anesthesia of skin: Secondary | ICD-10-CM | POA: Diagnosis not present

## 2019-08-02 DIAGNOSIS — M79601 Pain in right arm: Secondary | ICD-10-CM | POA: Diagnosis not present

## 2019-08-02 LAB — CBC WITH DIFFERENTIAL/PLATELET
Abs Immature Granulocytes: 0 10*3/uL (ref 0.00–0.07)
Band Neutrophils: 1 %
Basophils Absolute: 0 10*3/uL (ref 0.0–0.1)
Basophils Relative: 0 %
Eosinophils Absolute: 0 10*3/uL (ref 0.0–0.5)
Eosinophils Relative: 0 %
HCT: 42.6 % (ref 36.0–46.0)
Hemoglobin: 14 g/dL (ref 12.0–15.0)
Lymphocytes Relative: 40 %
Lymphs Abs: 5.2 10*3/uL — ABNORMAL HIGH (ref 0.7–4.0)
MCH: 30.6 pg (ref 26.0–34.0)
MCHC: 32.9 g/dL (ref 30.0–36.0)
MCV: 93 fL (ref 80.0–100.0)
Monocytes Absolute: 0.8 10*3/uL (ref 0.1–1.0)
Monocytes Relative: 6 %
Neutro Abs: 7 10*3/uL (ref 1.7–7.7)
Neutrophils Relative %: 53 %
Platelets: 192 10*3/uL (ref 150–400)
RBC: 4.58 MIL/uL (ref 3.87–5.11)
RDW: 15.4 % (ref 11.5–15.5)
Smear Review: NORMAL
WBC Morphology: ABNORMAL
WBC: 12.9 10*3/uL — ABNORMAL HIGH (ref 4.0–10.5)
nRBC: 0 % (ref 0.0–0.2)

## 2019-08-02 LAB — COMPREHENSIVE METABOLIC PANEL
ALT: 10 U/L (ref 0–44)
AST: 15 U/L (ref 15–41)
Albumin: 4.3 g/dL (ref 3.5–5.0)
Alkaline Phosphatase: 57 U/L (ref 38–126)
Anion gap: 8 (ref 5–15)
BUN: 20 mg/dL (ref 6–20)
CO2: 23 mmol/L (ref 22–32)
Calcium: 8.8 mg/dL — ABNORMAL LOW (ref 8.9–10.3)
Chloride: 106 mmol/L (ref 98–111)
Creatinine, Ser: 0.73 mg/dL (ref 0.44–1.00)
GFR calc Af Amer: >60 mL/min (ref >60)
GFR calc non Af Amer: >60 mL/min (ref >60)
Glucose, Bld: 87 mg/dL (ref 70–99)
Potassium: 4.2 mmol/L (ref 3.5–5.1)
Sodium: 137 mmol/L (ref 135–145)
Total Bilirubin: 0.8 mg/dL (ref 0.3–1.2)
Total Protein: 6.9 g/dL (ref 6.5–8.1)

## 2019-08-02 NOTE — Telephone Encounter (Signed)
Spoke with patient and informed her we had to switched her inhalers to what is covered by medicaid. Also she asked why she had to pay $16 for Loratadine. I informed the patient it is covered and should only be her co-pay. After speaking with the pharmacist at [facility] the claim is being kicked back because Medicaid thinks she has another insurance that needs to be ran. Patient informed to call Medicaid and let them know she only has Medicaid. Then it should go to being covered.

## 2019-08-02 NOTE — Telephone Encounter (Signed)
Patient requesting call back regarding.

## 2019-08-02 NOTE — Progress Notes (Signed)
Patient prescreened for appointment. Patient has no concerns or questions.  

## 2019-08-03 ENCOUNTER — Inpatient Hospital Stay: Payer: Medicaid Other | Attending: Oncology | Admitting: Oncology

## 2019-08-03 DIAGNOSIS — C9110 Chronic lymphocytic leukemia of B-cell type not having achieved remission: Secondary | ICD-10-CM | POA: Diagnosis not present

## 2019-08-05 DIAGNOSIS — M79601 Pain in right arm: Secondary | ICD-10-CM | POA: Insufficient documentation

## 2019-08-05 DIAGNOSIS — R29898 Other symptoms and signs involving the musculoskeletal system: Secondary | ICD-10-CM | POA: Insufficient documentation

## 2019-08-05 DIAGNOSIS — R200 Anesthesia of skin: Secondary | ICD-10-CM | POA: Insufficient documentation

## 2019-08-10 ENCOUNTER — Ambulatory Visit (INDEPENDENT_AMBULATORY_CARE_PROVIDER_SITE_OTHER): Payer: Medicaid Other | Admitting: Gastroenterology

## 2019-08-10 ENCOUNTER — Other Ambulatory Visit: Payer: Self-pay

## 2019-08-10 ENCOUNTER — Encounter: Payer: Self-pay | Admitting: Gastroenterology

## 2019-08-10 VITALS — BP 104/69 | HR 87 | Temp 98.2°F | Ht 67.0 in | Wt 159.2 lb

## 2019-08-10 DIAGNOSIS — K581 Irritable bowel syndrome with constipation: Secondary | ICD-10-CM | POA: Diagnosis not present

## 2019-08-10 NOTE — Progress Notes (Signed)
Jonathon Bellows MD, MRCP(U.K) [address]  [place]  [place], [place] [address]  Main: [phone]  Fax: [phone]   Primary Care Physician: [name], MD  Primary Gastroenterologist:  Dr. Jonathon Bellows   Follow-up for IBS-C  HPI: Monique [name] is a 48 y.o. female   Summary of history :     She was initially referred and seen in May 2020 for change in bowel habits.She used to go every morning like clock work, then she says she started getting constipated, developed abdominal distension associated with pain .  Sometimes she has to strain a lot , at times its very sticky. Abdominal pain resolves with a bowel movement. She is on Gabapentin for the past 1 year.   Interval history   01/26/2019-08/10/2019  04/21/2019: Colonoscopy: 5 sessile polyps resected.  Not adenomas benign.  Since her last visit she is doing well.  No significant GI issues.  She did not tolerate Linzess as it caused severe diarrhea.  Her diet is low in fiber.  Not taking anything for her IBS at this point of time.  Current Outpatient Medications  Medication Sig Dispense Refill  . albuterol (VENTOLIN HFA) 108 (90 Base) MCG/ACT inhaler INHALE TWO (2) PUFFS BY MOUTH EVERY 6 HOURS AS NEEDED FOR SHORTNESS OF BREATH AND WHEEZING 8.5 g 0  . atorvastatin (LIPITOR) 10 MG tablet Take 1 tablet (10 mg total) by mouth daily. 90 tablet 1  . Calcium Carbonate-Vit D-Min (GNP CALCIUM 1200) 1200-1000 MG-UNIT CHEW Chew 1,200 mg by mouth daily with breakfast. Take in combination with vitamin D and magnesium. 30 tablet 5  . Fluticasone-Salmeterol (ADVAIR DISKUS) 250-50 MCG/DOSE AEPB Inhale 1 puff into the lungs 2 (two) times daily. 1 each 3  . gabapentin (NEURONTIN) 100 MG capsule Take 1 capsule (100 mg total) by mouth 3 (three) times daily for 15 days, THEN 1 capsule (100 mg total) 4 (four) times daily for 15 days. Follow titration schedule.. 105 capsule 0  . Ibrutinib (IMBRUVICA) 420 MG TABS Take 1 tablet by mouth daily. 28  tablet 2  . lamoTRIgine (LAMICTAL) 100 MG tablet Take 1 tablet (100 mg total) by mouth 2 (two) times daily. . 180 tablet 1  . lidocaine-prilocaine (EMLA) cream Apply 1 application topically as needed. 30 g 1  . loratadine (CLARITIN) 10 MG tablet Take 1 tablet (10 mg total) by mouth every morning. 90 tablet 1  . Magnesium 500 MG CAPS Take 1 capsule (500 mg total) by mouth 2 (two) times daily at 8 am and 10 pm. 60 capsule 5  . meloxicam (MOBIC) 15 MG tablet Take 1 tablet (15 mg total) by mouth daily. 180 tablet 0  . metroNIDAZOLE (METROCREAM) 0.75 % cream APPLY A THIN LAYER TO FACE TWICE A DAY    . mupirocin ointment (BACTROBAN) 2 % Place 1 application into the nose 2 (two) times daily. (Patient not taking: Reported on 08/02/2019) 22 g 0  . omeprazole (PRILOSEC) 20 MG capsule TAKE 1 CAPSULE (20 MG TOTAL) BY MOUTH 2 (TWO) TIMES DAILY BEFORE A MEAL. 60 capsule 1  . sertraline (ZOLOFT) 25 MG tablet 75 mg.     . tiotropium (SPIRIVA HANDIHALER) 18 MCG inhalation capsule Place 1 capsule (18 mcg total) into inhaler and inhale daily. 30 capsule 3  . tiZANidine (ZANAFLEX) 2 MG tablet Take 1 tablet (2 mg total) by mouth every 8 (eight) hours as needed for muscle spasms. 90 tablet 5  . traZODone (DESYREL) 50 MG tablet Take 75  mg by mouth at bedtime as needed.     . triamcinolone cream (KENALOG) 0.1 % Apply 1 application topically 2 (two) times daily. 80 g 0   No current facility-administered medications for this visit.     Allergies as of 08/10/2019  . (No Known Allergies)    ROS:  General: Negative for anorexia, weight loss, fever, chills, fatigue, weakness. ENT: Negative for hoarseness, difficulty swallowing , nasal congestion. CV: Negative for chest pain, angina, palpitations, dyspnea on exertion, peripheral edema.  Respiratory: Negative for dyspnea at rest, dyspnea on exertion, cough, sputum, wheezing.  GI: See history of present illness. GU:  Negative for dysuria, hematuria, urinary  incontinence, urinary frequency, nocturnal urination.  Endo: Negative for unusual weight change.    Physical Examination:   There were no vitals taken for this visit.  General: Well-nourished, well-developed in no acute distress.  Eyes: No icterus. Conjunctivae pink. Mouth: Oropharyngeal mucosa moist and pink , no lesions erythema or exudate. Lungs: Clear to auscultation bilaterally. Non-labored. Heart: Regular rate and rhythm, no murmurs rubs or gallops.  Abdomen: Bowel sounds are normal, nontender, nondistended, no hepatosplenomegaly or masses, no abdominal bruits or hernia , no rebound or guarding.   Extremities: No lower extremity edema. No clubbing or deformities. Neuro: Alert and oriented x 3.  Grossly intact. Skin: Warm and dry, no jaundice.   Psych: Alert and cooperative, normal mood and affect.   Imaging Studies: No results found.  Assessment and Plan:   Monique [name] is a 48 y.o. y/o female here to follow-up for IBS-C.  Presently doing well.  Plan :   1. Increase dietary fiber.  High-fiber diet information provided and discussed.  Given samples of fiber pills.  Suggest to use MiraLAX as needed.  Samples provided.  If symptoms worsen to come back and see me.  Dr Jonathon Bellows  MD,MRCP [facility]) Follow up in as needed

## 2019-08-10 NOTE — Patient Instructions (Signed)

## 2019-08-11 ENCOUNTER — Telehealth: Payer: Self-pay | Admitting: Pain Medicine

## 2019-08-11 ENCOUNTER — Encounter: Payer: Self-pay | Admitting: Pain Medicine

## 2019-08-11 NOTE — Telephone Encounter (Signed)
Returning Nurse Call

## 2019-08-15 NOTE — Progress Notes (Signed)
Pain Management Virtual Encounter Note - Virtual Visit via Telephone Telehealth (real-time audio visits between healthcare provider and patient).   Patient's Phone No. & Preferred Pharmacy:  [phone] (home); [phone] (mobile); (Preferred) [phone] [email or web address]  [id number] - [place], [place] - [address] [place] [address] Phone: [phone] Fax: [phone]    Pre-screening note:  Our staff contacted Monique [name] and offered her an "in person", "face-to-face" appointment versus a telephone encounter. She indicated preferring the telephone encounter, at this time.   Reason for Virtual Visit: COVID-19*  Social distancing based on CDC and AMA recommendations.   I contacted Monique [name] on 08/16/2019 via telephone.      I clearly identified myself as Gaspar Cola, MD. I verified that I was speaking with the correct person using two identifiers (Name: Monique [name], and date of birth: [birth year]).  Advanced Informed Consent I sought verbal advanced consent from Monique [name] for virtual visit interactions. I informed Monique [name] of possible security and privacy concerns, risks, and limitations associated with providing "not-in-person" medical evaluation and management services. I also informed Monique [name] of the availability of "in-person" appointments. Finally, I informed her that there would be a charge for the virtual visit and that she could be  personally, fully or partially, financially responsible for it. Monique [name] expressed understanding and agreed to proceed.   Historic Elements   Monique [name] is a 48 y.o. year old, female patient evaluated today after her last encounter by our practice on 08/11/2019. Monique [name]  has a past medical history of Anxiety, Bursitis, Cervical cancer ([place]), Cervical intraepithelial neoplasia I, Chronic lymphocytic leukemia ([place]) (2018), Depression, Eating disorder, History of self-harm,  Insomnia, Obsession, Pap smear abnormality of cervix with LGSIL, Tobacco abuse, and Vitamin B12 deficiency (non anemic). She also  has a past surgical history that includes Cervical biopsy w/ loop electrode excision; Tubal ligation; Breast biopsy (Right, 01/05/2018); OTHER SURGICAL HISTORY; and Colonoscopy with propofol (N/A, 04/21/2019). Monique [name] has a current medication list which includes the following prescription(s): albuterol, atorvastatin, fluticasone-salmeterol, gabapentin, imbruvica, lamotrigine, lidocaine-prilocaine, loratadine, magnesium, metronidazole, mupirocin ointment, omeprazole, sertraline, spiriva handihaler, tizanidine, trazodone, triamcinolone cream, gnp calcium 1200, and meloxicam. She  reports that she has been smoking cigarettes. She started smoking about 32 years ago. She has a 15.50 pack-year smoking history. She has never used smokeless tobacco. She reports current alcohol use. She reports current drug use. Drug: Marijuana. Monique [name] has No Known Allergies.   HPI  Today, she is being contacted for follow-up evaluation to evaluate gabapentin titration and the results of her lumbar MRI.  She indicates that she is up to 300 mg twice daily, but she still feels very sleepy and has to stop at that point.  She also indicates that she is not quite getting complete relief of her pain at that dose.  I reminded her that we still have quite a bit higher that we can go with that dose, but we have to do it slowly so as to allow her to get used to the medication and hopefully for that somnolence to go away.  She also asked about the MRI, but it would seem that he has not been scheduled yet.  Today I have sent a communication to [employer] to see if she can follow-up with the request on the MRI and find out if it has been approved and if so when.  Pharmacotherapy Assessment  Analgesic: No opioid analgesics prescribed  by our practice. See 05/13/2018 UDS (+) Undeclared THC.   Monitoring: Pharmacotherapy:  No side-effects or adverse reactions reported. [place] PMP: PDMP reviewed during this encounter.       Compliance: No problems identified. Effectiveness: Clinically acceptable. Plan: Refer to "POC".  UDS:  Summary  Date Value Ref Range Status  05/13/2018 FINAL  Final    Comment:    ==================================================================== TOXASSURE COMP DRUG ANALYSIS,UR ==================================================================== Test                             Result       Flag       Units Drug Present and Declared for Prescription Verification   Gabapentin                     PRESENT      EXPECTED Drug Present not Declared for Prescription Verification   Carboxy-THC                    213          UNEXPECTED ng/mg creat    Carboxy-THC is a metabolite of tetrahydrocannabinol  (THC).    Source of [facility] is most commonly illicit, but THC is also present    in a scheduled prescription medication.   Naproxen                       PRESENT      UNEXPECTED Drug Absent but Declared for Prescription Verification   Hydrocodone                    Not Detected UNEXPECTED ng/mg creat   Tramadol                       Not Detected UNEXPECTED ng/mg creat   Citalopram                     Not Detected UNEXPECTED   Prochlorperazine               Not Detected UNEXPECTED   Acetaminophen                  Not Detected UNEXPECTED    Acetaminophen, as indicated in the declared medication list, is    not always detected even when used as directed. ==================================================================== Test                      Result    Flag   Units      Ref Range   Creatinine              218              mg/dL      >=20 ==================================================================== Declared Medications:  The flagging and interpretation on this report are based on the  following declared medications.  Unexpected results may arise from  inaccuracies in the declared  medications.  **Note: The testing scope of this panel includes these medications:  Escitalopram (Lexapro)  Gabapentin (Neurontin)  Hydrocodone (Norco)  Prochlorperazine (Compazine)  Tramadol (Ultram)  **Note: The testing scope of this panel does not include small to  moderate amounts of these reported medications:  Acetaminophen (Norco)  **Note: The testing scope of this panel does not include following  reported medications:  Umeclidinium (Anoro Ellipta)  Varenicline (Chantix)  Vilanterol (Anoro Ellipta) ====================================================================  For clinical consultation, please call [phone]. ====================================================================    Laboratory Chemistry Profile (12 mo)  Renal: 08/02/2019: BUN 20; Creatinine, Ser 0.73  Lab Results  Component Value Date   GFRAA >60 08/02/2019   GFRNONAA >60 08/02/2019   Hepatic: 08/02/2019: Albumin 4.3 Lab Results  Component Value Date   AST 15 08/02/2019   ALT 10 08/02/2019   Other: 08/21/2018: CRP 0.7; Sed Rate 2 Note: Above Lab results reviewed.  Imaging  DG PAIN CLINIC C-ARM 1-60 MIN NO REPORT Fluoro was used, but no Radiologist interpretation will be provided.  Please refer to "NOTES" tab for provider progress note.   Assessment  Diagnoses of Chronic neuropathic pain, Trigeminal neuralgia, Hypocalcemia, Vitamin D insufficiency, and Lumbar facet arthropathy were pertinent to this visit.  Plan of Care  Problem-specific:  No problem-specific Assessment & Plan notes found for this encounter.  I have changed Monique [name]'s gabapentin. I am also having her maintain her sertraline, traZODone, mupirocin ointment, metroNIDAZOLE, triamcinolone cream, atorvastatin, loratadine, Imbruvica, lamoTRIgine, omeprazole, Magnesium, albuterol, lidocaine-prilocaine, tiZANidine, Fluticasone-Salmeterol, Spiriva HandiHaler, GNP Calcium 1200, and meloxicam.  Pharmacotherapy (Medications  Ordered): Meds ordered this encounter  Medications  . gabapentin (NEURONTIN) 100 MG capsule    Sig: Take 2-3 capsules (200-300 mg total) by mouth 3 (three) times daily. Follow titration schedule.    Dispense:  270 capsule    Refill:  2    Fill one day early if pharmacy is closed on scheduled refill date. May substitute for generic if available.  . Calcium Carbonate-Vit D-Min (GNP CALCIUM 1200) 1200-1000 MG-UNIT CHEW    Sig: Chew 1,200 mg by mouth daily with breakfast. Take in combination with vitamin D and magnesium.    Dispense:  90 tablet    Refill:  3    Do not place medication on "Automatic Refill".  May substitute with similar over-the-counter product.  . meloxicam (MOBIC) 15 MG tablet    Sig: Take 1 tablet (15 mg total) by mouth daily.    Dispense:  180 tablet    Refill:  0    Do not add this medication to the electronic "Automatic Refill" notification system. Patient may have prescription filled one day early if pharmacy is closed on scheduled refill date.   Orders:  No orders of the defined types were placed in this encounter.  Follow-up plan:   Return in about 6 months (around 02/13/2020) for (VV), (MM).      Interventional management options: Planned, scheduled, and/or pending:   None at this time.   Considering:   Diagnostic/therapeutic right L3-4 LESI #3  Diagnostic/therapeutic right L4 TFESI #3  Diagnostic bilateral lumbar facet nerve block #1 Possible bilateral lumbar facet RFA   Palliative PRN treatment(s):   Palliative right L4 TFESI + right L3-4 LESI(IV sedation preferred)     Recent Visits Date Type Provider Dept  07/19/19 Telemedicine [name], MD [facility]  06/29/19 Procedure visit [name], MD [facility]  Showing recent visits within past 90 days and meeting all other requirements   Today's Visits Date Type Provider Dept  08/16/19 Telemedicine [name], MD [facility]  Showing today's  visits and meeting all other requirements   Future Appointments No visits were found meeting these conditions.  Showing future appointments within next 90 days and meeting all other requirements   I discussed the assessment and treatment plan with the patient. The patient was provided an opportunity to ask questions and all were answered. The patient agreed with  the plan and demonstrated an understanding of the instructions.  Patient advised to call back or seek an in-person evaluation if the symptoms or condition worsens.  Total duration of non-face-to-face encounter: 15 minutes.  Note by: Gaspar Cola, MD Date: 08/16/2019; Time: 9:43 AM  Note: This dictation was prepared with Dragon dictation. Any transcriptional errors that may result from this process are unintentional.  Disclaimer:  * Given the special circumstances of the COVID-19 pandemic, the federal government has announced that the Office for Civil Rights (OCR) will exercise its enforcement discretion and will not impose penalties on physicians using telehealth in the event of noncompliance with regulatory requirements under the [place] (HIPAA) in connection with the good faith provision of telehealth during the XX123456 national public health emergency. ([place])

## 2019-08-16 ENCOUNTER — Other Ambulatory Visit: Payer: Self-pay

## 2019-08-16 ENCOUNTER — Ambulatory Visit: Payer: Medicaid Other | Attending: Pain Medicine | Admitting: Pain Medicine

## 2019-08-16 DIAGNOSIS — G8929 Other chronic pain: Secondary | ICD-10-CM

## 2019-08-16 DIAGNOSIS — G500 Trigeminal neuralgia: Secondary | ICD-10-CM

## 2019-08-16 DIAGNOSIS — M792 Neuralgia and neuritis, unspecified: Secondary | ICD-10-CM

## 2019-08-16 DIAGNOSIS — M47816 Spondylosis without myelopathy or radiculopathy, lumbar region: Secondary | ICD-10-CM

## 2019-08-16 DIAGNOSIS — E559 Vitamin D deficiency, unspecified: Secondary | ICD-10-CM | POA: Diagnosis not present

## 2019-08-16 MED ORDER — MELOXICAM 15 MG PO TABS: 15.0000 mg | ORAL_TABLET | Freq: Every day | ORAL | 0 refills | Status: DC

## 2019-08-16 MED ORDER — GABAPENTIN 100 MG PO CAPS: 200.0000 mg | ORAL_CAPSULE | Freq: Three times a day (TID) | ORAL | 2 refills | Status: DC

## 2019-08-16 MED ORDER — GNP CALCIUM 1200 1200-1000 MG-UNIT PO CHEW: 1200.0000 mg | CHEWABLE_TABLET | Freq: Every day | ORAL | 3 refills | Status: DC

## 2019-08-20 MED FILL — IMBRUVICA 420 MG TAB: 420 | 28 days supply | Qty: 28 | Fill #1

## 2019-08-23 ENCOUNTER — Telehealth: Payer: Self-pay | Admitting: *Deleted

## 2019-08-27 ENCOUNTER — Encounter: Payer: Self-pay | Admitting: Family Medicine

## 2019-08-27 ENCOUNTER — Ambulatory Visit (INDEPENDENT_AMBULATORY_CARE_PROVIDER_SITE_OTHER): Payer: Medicaid Other | Admitting: Family Medicine

## 2019-08-27 ENCOUNTER — Other Ambulatory Visit: Payer: Self-pay

## 2019-08-27 DIAGNOSIS — C9110 Chronic lymphocytic leukemia of B-cell type not having achieved remission: Secondary | ICD-10-CM

## 2019-08-27 DIAGNOSIS — I700 Atherosclerosis of aorta: Secondary | ICD-10-CM

## 2019-08-27 DIAGNOSIS — F330 Major depressive disorder, recurrent, mild: Secondary | ICD-10-CM

## 2019-08-27 DIAGNOSIS — G500 Trigeminal neuralgia: Secondary | ICD-10-CM

## 2019-08-27 DIAGNOSIS — F4310 Post-traumatic stress disorder, unspecified: Secondary | ICD-10-CM

## 2019-08-27 DIAGNOSIS — J438 Other emphysema: Secondary | ICD-10-CM

## 2019-08-27 DIAGNOSIS — K219 Gastro-esophageal reflux disease without esophagitis: Secondary | ICD-10-CM

## 2019-08-27 MED ORDER — OMEPRAZOLE 20 MG PO CPDR: 20.0000 mg | DELAYED_RELEASE_CAPSULE | Freq: Every day | ORAL | 1 refills | Status: DC

## 2019-08-27 MED ORDER — ALBUTEROL SULFATE HFA 108 (90 BASE) MCG/ACT IN AERS: 2.0000 | INHALATION_SPRAY | RESPIRATORY_TRACT | 0 refills | Status: DC | PRN

## 2019-08-27 MED ORDER — SPIRIVA HANDIHALER 18 MCG IN CAPS: 18.0000 ug | ORAL_CAPSULE | Freq: Every day | RESPIRATORY_TRACT | 3 refills | Status: DC

## 2019-08-27 MED ORDER — FLUTICASONE-SALMETEROL 250-50 MCG/DOSE IN AEPB: 1.0000 | INHALATION_SPRAY | Freq: Two times a day (BID) | RESPIRATORY_TRACT | 3 refills | Status: DC
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
## 2019-08-31 ENCOUNTER — Telehealth: Payer: Self-pay

## 2019-08-31 ENCOUNTER — Ambulatory Visit
Admission: RE | Admit: 2019-08-31 | Discharge: 2019-08-31 | Disposition: A | Discharge status: Home / Self Care | Payer: Medicaid Other | Source: Ambulatory Visit | Attending: Pain Medicine | Admitting: Pain Medicine

## 2019-08-31 ENCOUNTER — Other Ambulatory Visit: Payer: Self-pay | Admitting: Family Medicine

## 2019-08-31 ENCOUNTER — Other Ambulatory Visit (HOSPITAL_COMMUNITY)
Admission: RE | Admit: 2019-08-31 | Discharge: 2019-08-31 | Disposition: A | Discharge status: Home / Self Care | Payer: Medicaid Other | Source: Ambulatory Visit | Attending: Family Medicine | Admitting: Family Medicine

## 2019-08-31 ENCOUNTER — Other Ambulatory Visit: Payer: Self-pay

## 2019-08-31 ENCOUNTER — Encounter: Payer: Self-pay | Admitting: Family Medicine

## 2019-08-31 ENCOUNTER — Ambulatory Visit: Payer: Medicaid Other | Admitting: Family Medicine

## 2019-08-31 VITALS — BP 120/70 | HR 78 | Temp 97.1°F | Resp 16 | Ht 67.0 in | Wt 159.7 lb

## 2019-08-31 DIAGNOSIS — M79604 Pain in right leg: Secondary | ICD-10-CM | POA: Diagnosis present

## 2019-08-31 DIAGNOSIS — Z113 Encounter for screening for infections with a predominantly sexual mode of transmission: Secondary | ICD-10-CM

## 2019-08-31 DIAGNOSIS — Z0000 Encounter for general adult medical examination without abnormal findings: Secondary | ICD-10-CM

## 2019-08-31 DIAGNOSIS — M5442 Lumbago with sciatica, left side: Secondary | ICD-10-CM | POA: Insufficient documentation

## 2019-08-31 DIAGNOSIS — Z124 Encounter for screening for malignant neoplasm of cervix: Secondary | ICD-10-CM | POA: Insufficient documentation

## 2019-08-31 DIAGNOSIS — H524 Presbyopia: Secondary | ICD-10-CM | POA: Diagnosis not present

## 2019-08-31 DIAGNOSIS — E538 Deficiency of other specified B group vitamins: Secondary | ICD-10-CM

## 2019-08-31 DIAGNOSIS — M79605 Pain in left leg: Secondary | ICD-10-CM | POA: Diagnosis present

## 2019-08-31 DIAGNOSIS — Z131 Encounter for screening for diabetes mellitus: Secondary | ICD-10-CM

## 2019-08-31 DIAGNOSIS — M5416 Radiculopathy, lumbar region: Secondary | ICD-10-CM | POA: Diagnosis present

## 2019-08-31 DIAGNOSIS — Z1159 Encounter for screening for other viral diseases: Secondary | ICD-10-CM

## 2019-08-31 DIAGNOSIS — E559 Vitamin D deficiency, unspecified: Secondary | ICD-10-CM | POA: Diagnosis not present

## 2019-08-31 DIAGNOSIS — M5136 Other intervertebral disc degeneration, lumbar region: Secondary | ICD-10-CM

## 2019-08-31 DIAGNOSIS — M5441 Lumbago with sciatica, right side: Secondary | ICD-10-CM | POA: Diagnosis present

## 2019-08-31 DIAGNOSIS — M545 Low back pain: Secondary | ICD-10-CM | POA: Diagnosis not present

## 2019-08-31 DIAGNOSIS — Z1231 Encounter for screening mammogram for malignant neoplasm of breast: Secondary | ICD-10-CM

## 2019-08-31 DIAGNOSIS — M5126 Other intervertebral disc displacement, lumbar region: Secondary | ICD-10-CM | POA: Diagnosis present

## 2019-08-31 DIAGNOSIS — Z716 Tobacco abuse counseling: Secondary | ICD-10-CM

## 2019-08-31 DIAGNOSIS — G8929 Other chronic pain: Secondary | ICD-10-CM | POA: Diagnosis present

## 2019-08-31 DIAGNOSIS — Z13220 Encounter for screening for lipoid disorders: Secondary | ICD-10-CM

## 2019-08-31 DIAGNOSIS — R928 Other abnormal and inconclusive findings on diagnostic imaging of breast: Secondary | ICD-10-CM

## 2019-08-31 MED ORDER — CHANTIX STARTING MONTH PAK 0.5 MG X 11 & 1 MG X 42 PO TABS: ORAL_TABLET | ORAL | 0 refills | Status: DC
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
## 2019-08-31 NOTE — Telephone Encounter (Signed)
Copied from [place] [phone]. Topic: General - Other >> Aug 31, 2019  2:12 PM Monique [name] wrote: Reason for CRM: Pt stated that she was advised that she needs other things done at the breast cancer center and they will be sending paper work for Dr. Ancil Boozer to sign and if Dr. Ancil Boozer is able to sign paper work today Pt would be able to call back around 4:30 to get scheduled appt situated  Have you seen any paperwork.

## 2019-08-31 NOTE — Telephone Encounter (Signed)
I just checked the fax and the folder and we do not have anything on her yet

## 2019-09-01 ENCOUNTER — Other Ambulatory Visit: Payer: Self-pay | Admitting: Family Medicine

## 2019-09-01 DIAGNOSIS — Z21 Asymptomatic human immunodeficiency virus [HIV] infection status: Secondary | ICD-10-CM

## 2019-09-02 LAB — CYTOLOGY - PAP
Chlamydia: NEGATIVE
Comment: NEGATIVE
Comment: NEGATIVE
Comment: NORMAL
High risk HPV: POSITIVE — AB
Neisseria Gonorrhea: NEGATIVE

## 2019-09-03 NOTE — Telephone Encounter (Signed)
Never received any paperwork.

## 2019-09-08 LAB — LIPID PANEL
Cholesterol: 133 mg/dL (ref <200)
HDL: 57 mg/dL (ref >=50)
LDL Cholesterol (Calc): 63 mg/dL (calc)
Non-HDL Cholesterol (Calc): 76 mg/dL (calc) (ref <130)
Total CHOL/HDL Ratio: 2.3 (calc) (ref <5.0)
Triglycerides: 54 mg/dL (ref <150)

## 2019-09-08 LAB — HIV-1/2 AB - DIFFERENTIATION
HIV-1 antibody: NEGATIVE
HIV-2 Ab: NEGATIVE

## 2019-09-08 LAB — HEMOGLOBIN A1C
Hgb A1c MFr Bld: 5.1 % of total Hgb (ref <5.7)
Mean Plasma Glucose: 100 (calc)
eAG (mmol/L): 5.5 (calc)

## 2019-09-08 LAB — VITAMIN B12: Vitamin B-12: 399 pg/mL (ref 200–1100)

## 2019-09-08 LAB — HIV-1 RNA, QUALITATIVE, TMA: HIV-1 RNA, Qualitative, TMA: NOT DETECTED

## 2019-09-08 LAB — HEPATITIS C ANTIBODY
Hepatitis C Ab: NONREACTIVE
SIGNAL TO CUT-OFF: 0.01 (ref <1.00)

## 2019-09-08 LAB — HIV ANTIBODY (ROUTINE TESTING W REFLEX): HIV 1&2 Ab, 4th Generation: REACTIVE — AB

## 2019-09-08 LAB — RPR: RPR Ser Ql: NONREACTIVE

## 2019-09-08 LAB — VITAMIN D 25 HYDROXY (VIT D DEFICIENCY, FRACTURES): Vit D, 25-Hydroxy: 51 ng/mL (ref 30–100)

## 2019-09-08 NOTE — Progress Notes (Signed)
Consider: right paracentral L4-5 LESI.

## 2019-09-09 ENCOUNTER — Other Ambulatory Visit: Payer: Self-pay | Admitting: Family Medicine

## 2019-09-09 ENCOUNTER — Telehealth: Payer: Self-pay | Admitting: Family Medicine

## 2019-09-09 DIAGNOSIS — R87612 Low grade squamous intraepithelial lesion on cytologic smear of cervix (LGSIL): Secondary | ICD-10-CM

## 2019-09-09 NOTE — Telephone Encounter (Signed)
Pt wanted to let Dr. Ancil Boozer know that the OBGYN she went to before was Dr. Marcelline Mates with [facility] and if she can be sent or scheduled there.   Pt would like a call to go over Pap results

## 2019-09-13 ENCOUNTER — Ambulatory Visit
Admission: RE | Admit: 2019-09-13 | Discharge: 2019-09-13 | Disposition: A | Discharge status: Home / Self Care | Payer: Medicaid Other | Source: Ambulatory Visit | Attending: Family Medicine | Admitting: Family Medicine

## 2019-09-13 DIAGNOSIS — R928 Other abnormal and inconclusive findings on diagnostic imaging of breast: Secondary | ICD-10-CM

## 2019-09-17 DIAGNOSIS — U071 COVID-19: Secondary | ICD-10-CM

## 2019-09-17 HISTORY — DX: COVID-19: U07.1

## 2019-09-22 MED FILL — IMBRUVICA 420 MG TAB: 420 | 28 days supply | Qty: 28 | Fill #2

## 2019-09-25 IMAGING — MG US  BREAST BX W/ LOC DEV 1ST LESION IMG BX SPEC US GUIDE*R*
1 series · 8 of 8 positions shown · non-contrast
Comparison: Previous exam(s).

ADDENDUM:
The clip for lesion #2 is a wing shaped clip not a ribbon.

The 8 o'clock subareolar mass biopsy demonstrates no atypia or
malignancy. Mixed inflammation and giant cell reaction consistent
with infection or granulomatous mastitis is identified. The findings
are concordant. Recommend surgical consultation for management. I
favor granulomatous mastitis over infection given the lack of acute
symptoms prior to the biopsy.
The mass at 10 o'clock, 3 cm from the nipple demonstrates
fibrocystic changes. Imaging and pathology findings are concordant.
No follow-up necessary in this region.
The right axillary node demonstrates involvement with the patient's
known CLL/SLL. The findings are concordant. Recommend continued
oncologic follow up.
The patient also had a mass at 10 o'clock, 5 cm from the nipple
which was not biopsied. Recommend a six-month follow-up ultrasound
to ensure stability or resolution.
These findings will be called to the patient today.
CLINICAL DATA: Patient presents for ultrasound-guided core needle
biopsy of a 8 o'clock position retroareolar right breast mass,
initially found by palpation. A smaller adjacent 10 o'clock
position, 3 cm the nipple, mass was also discovered on diagnostic
imaging, also to undergo ultrasound-guided core needle biopsy. In
addition, patient had 6 enlarged abnormal right axillary lymph
nodes, 1 of which will be biopsied under ultrasound guidance.
Patient carries the presumptive diagnosis of CLL.
EXAM:
ULTRASOUND GUIDED RIGHT BREAST CORE NEEDLE BIOPSY: Lesion #1
ULTRASOUND GUIDED RIGHT BREAST CORE NEEDLE BIOPSY: Lesion #2
ULTRASOUND GUIDED RIGHT AXILLARY LYMPH NODE CORE NEEDLE BIOPSY:
Lesion #3

[Series 1: MG view · 0.06mm/px · 8 of 39 slices shown]
[im 1/39]
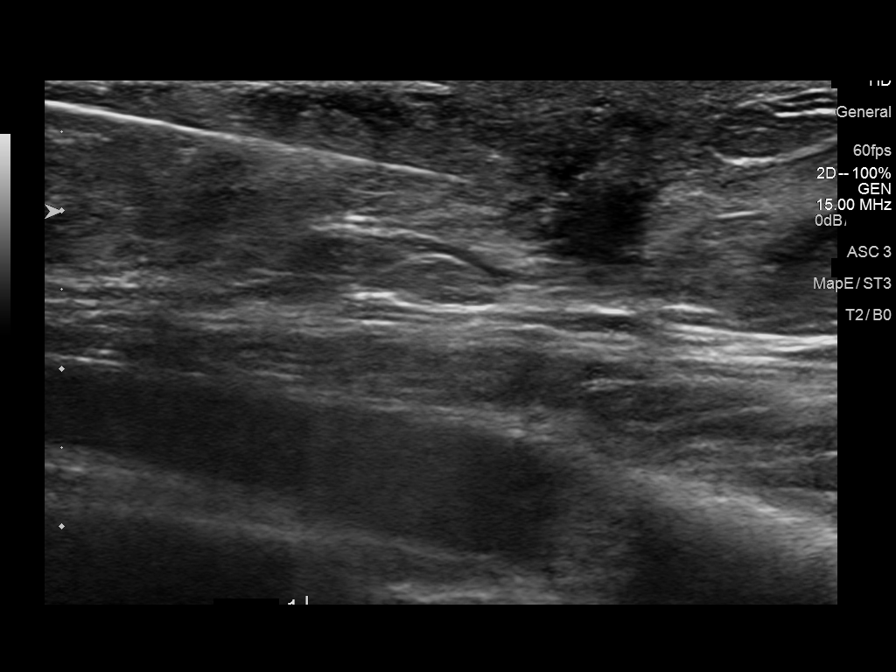
[im 6/39]
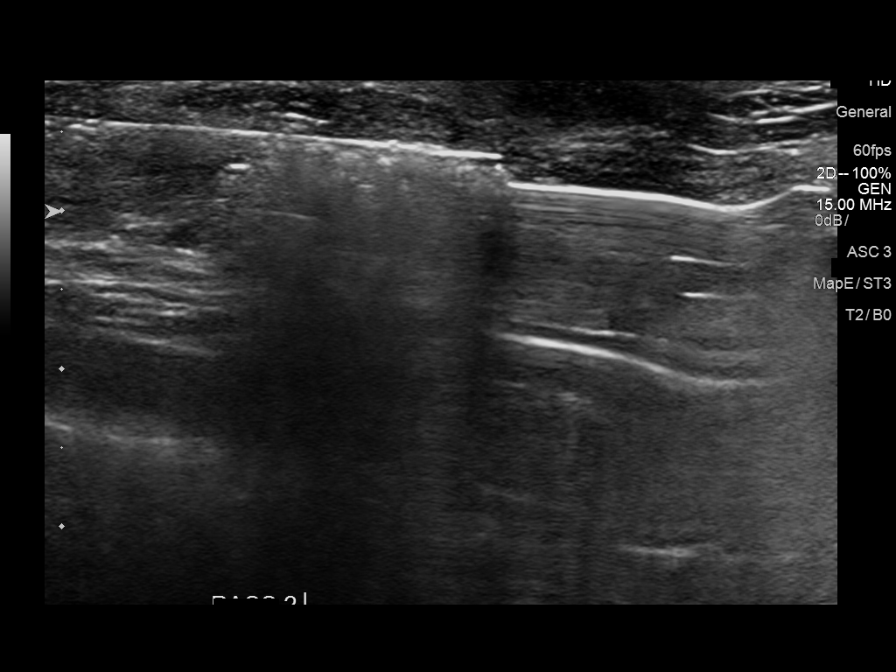
[im 11/39]
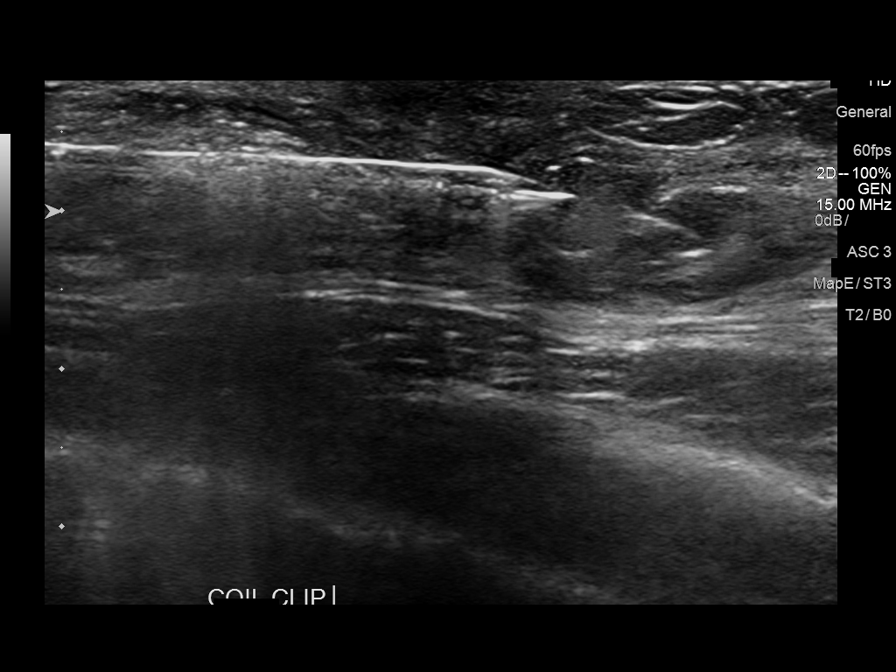
[im 17/39]
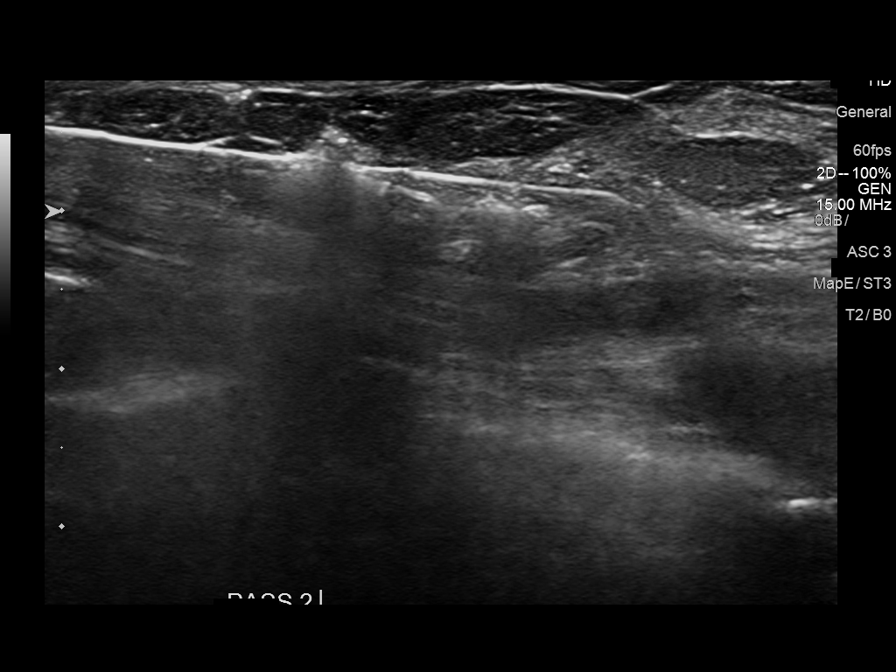
[im 22/39]
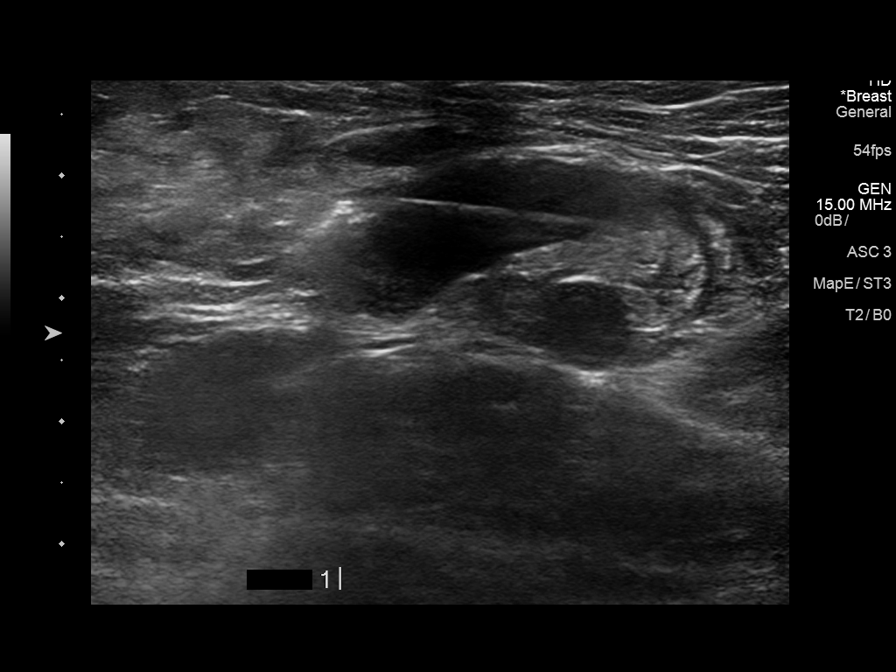
[im 28/39]
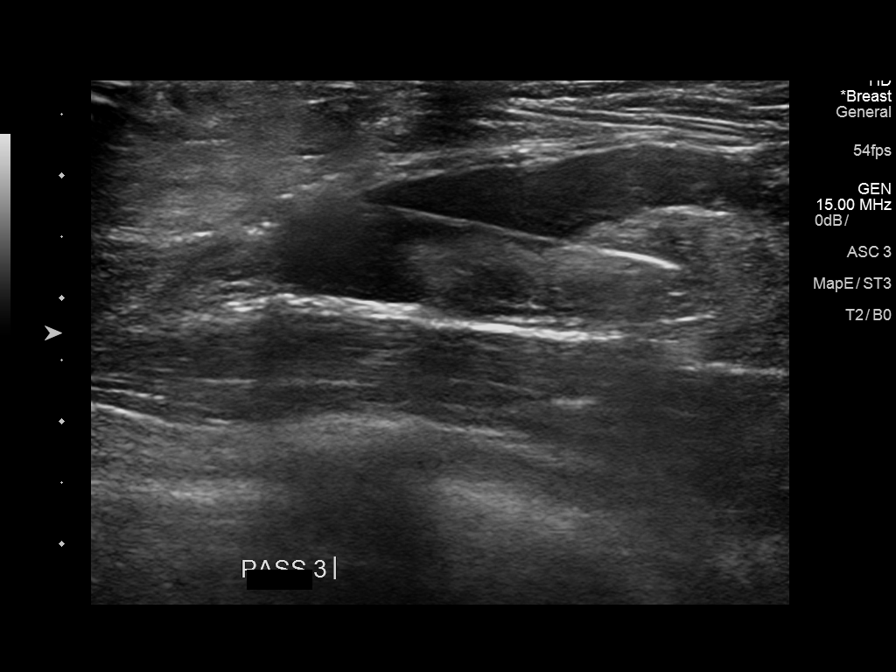
[im 33/39]
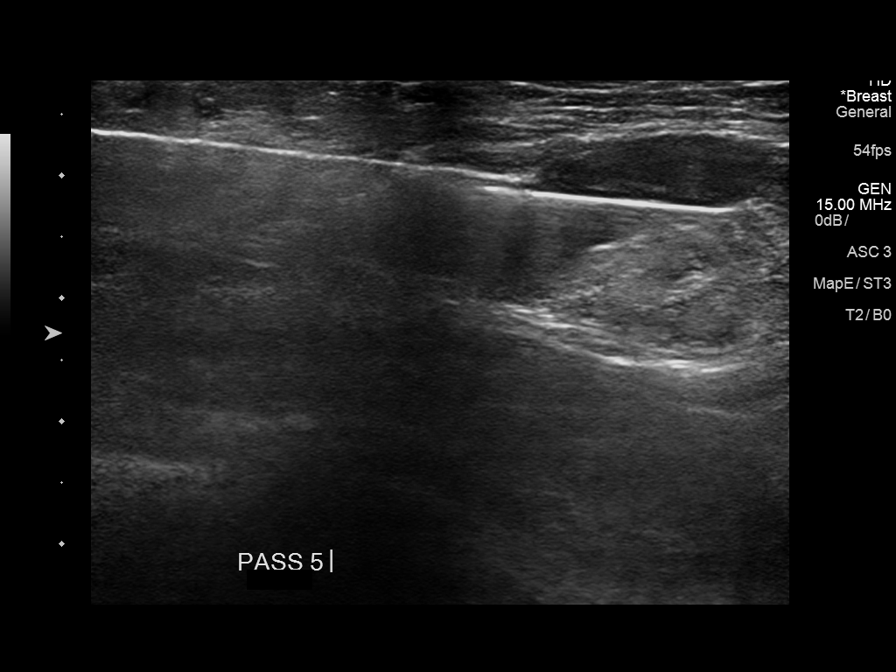
[im 39/39]
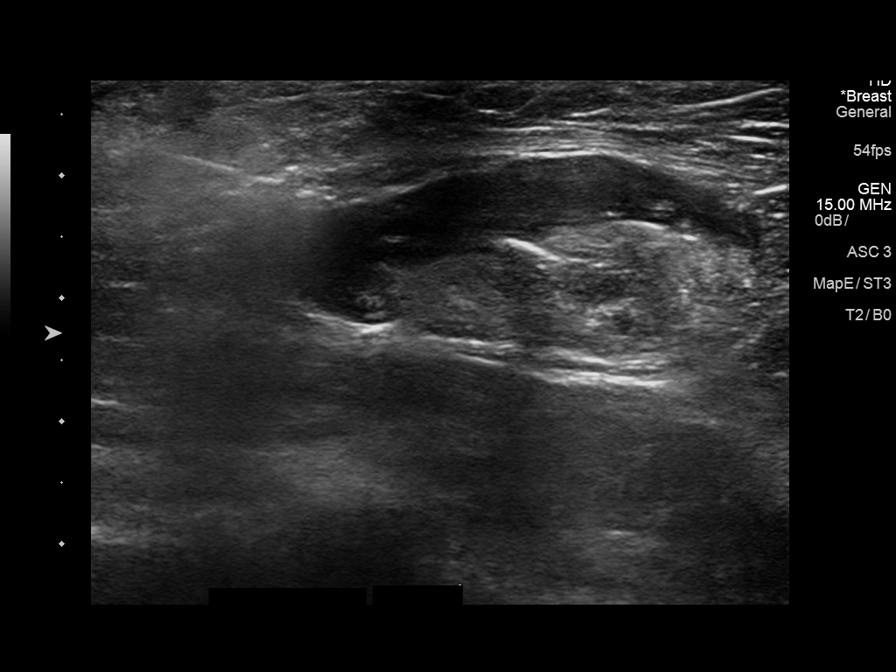

[8 of 8 positions shown; findings below may reference images not displayed]



Lesion #1: 8 o'clock retroareolar.

Lesion quadrant: Lower inner quadrant

Using sterile technique and 1% Lidocaine as local anesthetic, under
direct ultrasound visualization, a 12 gauge Cipolla device was
used to perform biopsy of the retroareolar 1.3 cm mass using a
lateral approach. At the conclusion of the procedure a coil shaped
tissue marker clip was deployed into the biopsy cavity.

Lesion #2: 10 o'clock, 3 cm from the nipple.

Lesion quadrant: Upper outer quadrant

Using sterile technique and 1% Lidocaine as local anesthetic, under
direct ultrasound visualization, a 12 gauge Cipolla device was
used to perform biopsy of the 10 o'clock position mass using an
inferior approach. At the conclusion of the procedure a ribbon
shaped tissue marker clip was deployed into the biopsy cavity.

Lesion #3: Abnormal right axillary lymph node.

Using sterile technique and 1% Lidocaine as local anesthetic, under
direct ultrasound visualization, a 14 gauge Cipolla device was
used to perform biopsy of 1 of the abnormal right axillary lymph
nodes using an inferior approach. At the conclusion of the procedure
a HydroMARK tissue marker clip was deployed into the biopsy cavity.

Follow up 2 view mammogram was performed and dictated separately.
IMPRESSION: Ultrasound guided biopsy of 3 lesions, 2 in the right breast and 1
abnormal right axillary lymph node. No apparent complications.

## 2019-10-04 DIAGNOSIS — E538 Deficiency of other specified B group vitamins: Secondary | ICD-10-CM | POA: Diagnosis not present

## 2019-10-04 DIAGNOSIS — Q998 Other specified chromosome abnormalities: Secondary | ICD-10-CM | POA: Diagnosis not present

## 2019-10-04 DIAGNOSIS — R519 Headache, unspecified: Secondary | ICD-10-CM | POA: Diagnosis not present

## 2019-10-04 DIAGNOSIS — E519 Thiamine deficiency, unspecified: Secondary | ICD-10-CM | POA: Diagnosis not present

## 2019-10-08 ENCOUNTER — Telehealth: Payer: Self-pay | Admitting: Oncology

## 2019-10-08 NOTE — Telephone Encounter (Signed)
Received communication from [facility] [facility] that a Anemia referral was sent on behalf of patient.  Called pateint to schedule appt for lab\md\feraheme. No answer. Left VM to contact office to schedule.

## 2019-10-15 ENCOUNTER — Other Ambulatory Visit: Payer: Self-pay | Admitting: Family Medicine

## 2019-10-15 DIAGNOSIS — D509 Iron deficiency anemia, unspecified: Secondary | ICD-10-CM | POA: Insufficient documentation

## 2019-10-15 DIAGNOSIS — A048 Other specified bacterial intestinal infections: Secondary | ICD-10-CM

## 2019-10-15 NOTE — Progress Notes (Signed)
[place]  [phone] [phone]  ID: Monique [name] OB: Dec 16, 1970  MR#: [id number]  TOI#:[id number]  Patient Care Team: [name], MD as PCP - General (Family Medicine) Lloyd Huger, MD as Consulting Physician (Oncology)  CHIEF COMPLAINT: CLL, iron deficiency.  INTERVAL HISTORY: Patient returns to clinic today for repeat laboratory patient returns to clinic today for repeat laboratory work and routine 42-monthfollow-up.  She has noticed increasing weakness and fatigue over the past several weeks, but otherwise has felt well.  She is found to have a ferritin of less than 5, but otherwise laboratory work was within normal limits.  She continues to tolerate Imbruvica without significant side effects. She has no neurologic complaints.  She denies any fevers or night sweats.  She has a good appetite and denies weight loss.  She has no chest pain, shortness of breath, cough, or hemoptysis.  She denies any nausea, vomiting, constipation, or diarrhea.  She has no melena or hematochezia.  She has no urinary complaints.  Patient offers no further specific complaints today.  REVIEW OF SYSTEMS:   Review of Systems  Constitutional: Positive for malaise/fatigue. Negative for diaphoresis, fever and weight loss.  HENT: Negative.  Negative for congestion.   Respiratory: Negative.  Negative for cough and shortness of breath.   Cardiovascular: Negative.  Negative for chest pain and leg swelling.  Gastrointestinal: Negative for abdominal pain and constipation.  Genitourinary: Negative.  Negative for dysuria and frequency.  Musculoskeletal: Negative.  Negative for back pain, myalgias and neck pain.  Skin: Negative.   Neurological: Positive for weakness. Negative for tingling, sensory change and focal weakness.  Psychiatric/Behavioral: Negative.  Negative for depression and suicidal ideas. The patient is not nervous/anxious.     As per HPI. Otherwise,  a complete review of systems is negative.  PAST MEDICAL HISTORY: Past Medical History:  Diagnosis Date  . Anxiety   . Bursitis    leg pain  . Carpal tunnel syndrome   . [place] over 18 years ago.   . Cervical intraepithelial neoplasia I   . [place]ocytic [place]   Dr FGrayland Ormond . Depression   . Eating disorder   . History of self-harm   . Insomnia   . Obsession   . Pap smear abnormality of cervix with LGSIL   . Tobacco abuse   . Vitamin B12 deficiency (non anemic)     PAST SURGICAL HISTORY: Past Surgical History:  Procedure Laterality Date  . BREAST BIOPSY Right 01/05/2018   [place] of 2 areas and 1 lymph node, MIXED INFLAMMATION AND GIANT CELL REACTION  . CERVICAL BIOPSY  W/ LOOP ELECTRODE EXCISION    . COLONOSCOPY WITH PROPOFOL N/A 04/21/2019   Procedure: COLONOSCOPY WITH PROPOFOL;  Surgeon: TVirgel Manifold MD;  Location: [facility] ENDOSCOPY;  Service: Gastroenterology;  Laterality: N/A;  . OTHER SURGICAL HISTORY     scar tissue removed from vocal cords  . TUBAL LIGATION      FAMILY HISTORY: Family History  Problem Relation Age of Onset  . Cancer Mother        thyroid  . Alcohol abuse Father   . Alcohol abuse Brother   . Depression Brother   . Bipolar disorder Brother   . ADD / ADHD Son   . Breast cancer Neg Hx     ADVANCED DIRECTIVES (Y/N):  N  HEALTH MAINTENANCE: Social History   Tobacco Use  .  Smoking status: Current Every Day Smoker    Packs/day: 0.50    Years: 31.00    Pack years: 15.50    Types: Cigarettes    Start date: 09/25/1986  . Smokeless tobacco: Never Used  Substance Use Topics  . Alcohol use: Not Currently    Alcohol/week: 0.0 standard drinks    Comment: rarely  . Drug use: Not Currently    Types: Marijuana     Colonoscopy:  PAP:  Bone density:  Lipid panel:  No Known Allergies  Current Outpatient Medications  Medication Sig Dispense Refill  . albuterol (VENTOLIN HFA) 108  (90 Base) MCG/ACT inhaler Inhale 2 puffs into the lungs every 4 (four) hours as needed for wheezing or shortness of breath. 8.5 g 0  . ALPRAZolam (XANAX) 0.25 MG tablet TAKE 1 OR 2 TABLETS BY MOUTH DAILY AS NEEDED    . atorvastatin (LIPITOR) 10 MG tablet Take 1 tablet (10 mg total) by mouth daily. 90 tablet 1  . Calcium Carbonate-Vit D-Min (GNP CALCIUM 1200) 1200-1000 MG-UNIT CHEW Chew 1,200 mg by mouth daily with breakfast. Take in combination with vitamin D and magnesium. 90 tablet 3  . Fluticasone-Salmeterol (ADVAIR DISKUS) 250-50 MCG/DOSE AEPB Inhale 1 puff into the lungs 2 (two) times daily. 1 each 3  . gabapentin (NEURONTIN) 100 MG capsule Take 2-3 capsules (200-300 mg total) by mouth 3 (three) times daily. Follow titration schedule. 270 capsule 2  . IMBRUVICA 420 MG TABS TAKE 1 TABLET BY MOUTH DAILY. 28 tablet 2  . lamoTRIgine (LAMICTAL) 100 MG tablet Take 1 tablet (100 mg total) by mouth 2 (two) times daily. . 180 tablet 1  . lidocaine-prilocaine (EMLA) cream Apply 1 application topically as needed. 30 g 1  . loratadine (CLARITIN) 10 MG tablet Take 1 tablet (10 mg total) by mouth every morning. 90 tablet 1  . Magnesium 500 MG CAPS Take 1 capsule (500 mg total) by mouth 2 (two) times daily at 8 am and 10 pm. 60 capsule 5  . meloxicam (MOBIC) 15 MG tablet Take 1 tablet (15 mg total) by mouth daily. 180 tablet 0  . metroNIDAZOLE (METROCREAM) 0.75 % cream APPLY A THIN LAYER TO FACE TWICE A DAY    . omeprazole (PRILOSEC) 20 MG capsule Take 1 capsule (20 mg total) by mouth daily. 90 capsule 1  . sertraline (ZOLOFT) 25 MG tablet 75 mg.     . tiotropium (SPIRIVA HANDIHALER) 18 MCG inhalation capsule Place 1 capsule (18 mcg total) into inhaler and inhale daily. 30 capsule 3  . tiZANidine (ZANAFLEX) 2 MG tablet Take 1 tablet (2 mg total) by mouth every 8 (eight) hours as needed for muscle spasms. 90 tablet 5  . traZODone (DESYREL) 50 MG tablet Take 75 mg by mouth at bedtime as needed.     .  triamcinolone cream (KENALOG) 0.1 % Apply 1 application topically 2 (two) times daily. 80 g 0  . varenicline (CHANTIX STARTING MONTH PAK) 0.5 MG X 11 & 1 MG X 42 tablet Take one 0.5 mg tablet by mouth once daily for 3 days, then increase to one 0.5 mg tablet twice daily for 4 days, then increase to one 1 mg tablet twice daily. (Patient not taking: Reported on 10/21/2019) 53 tablet 0   No current facility-administered medications for this visit.    OBJECTIVE: Vitals:   10/21/19 1344 10/21/19 1346  BP: (!) 86/61 102/66  Pulse: 81 78  Resp: 16   Temp: (!) 96.9 F (36.1 C)  Body mass index is 25.08 kg/m.    ECOG FS:0 - Asymptomatic  General: Well-developed, well-nourished, no acute distress. Eyes: Pink conjunctiva, anicteric sclera. HEENT: Normocephalic, moist mucous membranes. Lungs: No audible wheezing or coughing. Heart: Regular rate and rhythm. Abdomen: Soft, nontender, no obvious distention. Musculoskeletal: No edema, cyanosis, or clubbing. Neuro: Alert, answering all questions appropriately. Cranial nerves grossly intact. Skin: No rashes or petechiae noted. Psych: Normal affect.   LAB RESULTS:  Lab Results  Component Value Date   NA 136 10/21/2019   K 3.8 10/21/2019   CL 104 10/21/2019   CO2 24 10/21/2019   GLUCOSE 101 (H) 10/21/2019   BUN 19 10/21/2019   CREATININE 0.81 10/21/2019   CALCIUM 8.6 (L) 10/21/2019   PROT 6.4 (L) 10/21/2019   ALBUMIN 3.9 10/21/2019   AST 18 10/21/2019   ALT 13 10/21/2019   ALKPHOS 55 10/21/2019   BILITOT 0.7 10/21/2019   GFRNONAA >60 10/21/2019   GFRAA >60 10/21/2019    Lab Results  Component Value Date   WBC 10.3 10/21/2019   NEUTROABS 4.2 10/21/2019   HGB 12.7 10/21/2019   HCT 40.4 10/21/2019   MCV 94.2 10/21/2019   PLT 172 10/21/2019     STUDIES: No results found.  ASSESSMENT: CLL,  iron deficiency.  PLAN:    1. CLL: Confirmed by peripheral blood flow cytometry.  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  CT scan results from April 30, 2019 reviewed independently with continued improvement of patient's known lymphadenopathy.  Patient completed cycle 3 of Rituxan plus Treanda on March 26, 2018, but was noted to have progressive disease therefore initiated 480 mg Imbruvica daily.  Continue current treatment indefinitely until progression of disease or intolerable side effects.  If patient's weakness and fatigue is still persistent in 3 months, can consider dose reducing Imbruvica.  Return to clinic in 3 months with repeat laboratory work and further evaluation.  Can consider repeat imaging in the near future. 2.  Leukocytosis: Resolved.  Continue Imbruvica as above. 3.  Trigeminal neuralgia: Patient does not complain of this today.  Appears to be unrelated to CLL or Imbruvica.  MRI of the brain on Jan 25, 2019 was unremarkable.   4.  Depression/anxiety: Chronic.  Continue follow-up and treatment per primary care. 5.  Back pain: Continue evaluation and treatment per primary care. 6.  Iron deficiency: Patient has a reported ferritin of less than 5 with an otherwise normal iron panel and hemoglobin.  She is mildly symptomatic with weakness and fatigue, although it is unclear if these are related.  Proceed with 1 infusion of 510 mg IV Feraheme today.  Patient does not require second infusion.  Return to clinic as above with repeat laboratory work and further evaluation.   Patient expressed understanding and was in agreement with this plan. She also understands that She can call clinic at any time with any questions, concerns, or complaints.    Lloyd Huger, MD   10/21/2019 6:46 PM

## 2019-10-18 ENCOUNTER — Other Ambulatory Visit: Payer: Self-pay | Admitting: Oncology

## 2019-10-18 DIAGNOSIS — C9110 Chronic lymphocytic leukemia of B-cell type not having achieved remission: Secondary | ICD-10-CM

## 2019-10-19 ENCOUNTER — Ambulatory Visit (INDEPENDENT_AMBULATORY_CARE_PROVIDER_SITE_OTHER): Payer: Medicaid Other | Admitting: Obstetrics and Gynecology

## 2019-10-19 ENCOUNTER — Encounter: Payer: Self-pay | Admitting: Obstetrics and Gynecology

## 2019-10-19 ENCOUNTER — Other Ambulatory Visit: Payer: Self-pay | Admitting: Oncology

## 2019-10-19 ENCOUNTER — Other Ambulatory Visit: Payer: Self-pay

## 2019-10-19 ENCOUNTER — Other Ambulatory Visit (HOSPITAL_COMMUNITY)
Admission: RE | Admit: 2019-10-19 | Discharge: 2019-10-19 | Disposition: A | Discharge status: Home / Self Care | Payer: Medicaid Other | Source: Ambulatory Visit | Attending: Obstetrics and Gynecology | Admitting: Obstetrics and Gynecology

## 2019-10-19 VITALS — BP 110/74 | HR 77 | Ht 67.0 in | Wt 162.0 lb

## 2019-10-19 DIAGNOSIS — N870 Mild cervical dysplasia: Secondary | ICD-10-CM

## 2019-10-19 DIAGNOSIS — R87612 Low grade squamous intraepithelial lesion on cytologic smear of cervix (LGSIL): Secondary | ICD-10-CM | POA: Diagnosis not present

## 2019-10-19 DIAGNOSIS — Z87410 Personal history of cervical dysplasia: Secondary | ICD-10-CM | POA: Insufficient documentation

## 2019-10-19 DIAGNOSIS — N871 Moderate cervical dysplasia: Secondary | ICD-10-CM | POA: Diagnosis not present

## 2019-10-19 NOTE — Patient Instructions (Signed)

## 2019-10-19 NOTE — Progress Notes (Signed)
Pt present for colpo due ot having postivie hpv on pap. Pt stated that she was doing well no probblems.

## 2019-10-19 NOTE — Progress Notes (Signed)
    GYNECOLOGY CLINIC COLPOSCOPY PROCEDURE NOTE  50 y.o. [id number] here for colposcopy for low-grade squamous intraepithelial neoplasia (LGSIL - encompassing HPV,mild dysplasia,CIN I) pap smear on 08/31/2019. Discussed role for HPV in cervical dysplasia, need for surveillance.  Patient has had a history of abnormal pap smears in the past, also with h/o LEEP.   Patient given informed consent, signed copy in the chart, time out was performed.  Placed in lithotomy position. Cervix viewed with speculum and colposcope after application of acetic acid.   Colposcopy adequate? Yes  no mosaicism, no punctation, no abnormal vasculature and acetowhite lesion(s) noted at 6-7 o'clock; corresponding biopsies obtained at 7 o'clock.  ECC specimen obtained. All specimens were labeled and sent to pathology.  Patient was given post procedure instructions.  Will follow up pathology and manage accordingly; patient will be contacted with results and recommendations.  Routine preventative health maintenance measures emphasized.    Rubie Maid, MD [facility]

## 2019-10-21 ENCOUNTER — Inpatient Hospital Stay: Payer: Medicaid Other

## 2019-10-21 ENCOUNTER — Other Ambulatory Visit: Payer: Self-pay

## 2019-10-21 ENCOUNTER — Inpatient Hospital Stay (HOSPITAL_BASED_OUTPATIENT_CLINIC_OR_DEPARTMENT_OTHER): Payer: Medicaid Other | Admitting: Oncology

## 2019-10-21 ENCOUNTER — Inpatient Hospital Stay: Payer: Medicaid Other | Attending: Oncology

## 2019-10-21 ENCOUNTER — Encounter: Payer: Self-pay | Admitting: Oncology

## 2019-10-21 VITALS — BP 102/66 | HR 78 | Temp 96.9°F | Resp 16 | Wt 160.1 lb

## 2019-10-21 DIAGNOSIS — D509 Iron deficiency anemia, unspecified: Secondary | ICD-10-CM

## 2019-10-21 DIAGNOSIS — F17210 Nicotine dependence, cigarettes, uncomplicated: Secondary | ICD-10-CM | POA: Diagnosis not present

## 2019-10-21 DIAGNOSIS — C9110 Chronic lymphocytic leukemia of B-cell type not having achieved remission: Secondary | ICD-10-CM

## 2019-10-21 DIAGNOSIS — E611 Iron deficiency: Secondary | ICD-10-CM | POA: Diagnosis present

## 2019-10-21 LAB — CBC WITH DIFFERENTIAL/PLATELET
Abs Immature Granulocytes: 0.04 10*3/uL (ref 0.00–0.07)
Basophils Absolute: 0.1 10*3/uL (ref 0.0–0.1)
Basophils Relative: 1 %
Eosinophils Absolute: 0.1 10*3/uL (ref 0.0–0.5)
Eosinophils Relative: 1 %
HCT: 40.4 % (ref 36.0–46.0)
Hemoglobin: 12.7 g/dL (ref 12.0–15.0)
Immature Granulocytes: 0 %
Lymphocytes Relative: 51 %
Lymphs Abs: 5.3 10*3/uL — ABNORMAL HIGH (ref 0.7–4.0)
MCH: 29.6 pg (ref 26.0–34.0)
MCHC: 31.4 g/dL (ref 30.0–36.0)
MCV: 94.2 fL (ref 80.0–100.0)
Monocytes Absolute: 0.7 10*3/uL (ref 0.1–1.0)
Monocytes Relative: 7 %
Neutro Abs: 4.2 10*3/uL (ref 1.7–7.7)
Neutrophils Relative %: 40 %
Platelets: 172 10*3/uL (ref 150–400)
RBC: 4.29 MIL/uL (ref 3.87–5.11)
RDW: 14.7 % (ref 11.5–15.5)
Smear Review: NORMAL
WBC: 10.3 10*3/uL (ref 4.0–10.5)
nRBC: 0 % (ref 0.0–0.2)

## 2019-10-21 LAB — COMPREHENSIVE METABOLIC PANEL
ALT: 13 U/L (ref 0–44)
AST: 18 U/L (ref 15–41)
Albumin: 3.9 g/dL (ref 3.5–5.0)
Alkaline Phosphatase: 55 U/L (ref 38–126)
Anion gap: 8 (ref 5–15)
BUN: 19 mg/dL (ref 6–20)
CO2: 24 mmol/L (ref 22–32)
Calcium: 8.6 mg/dL — ABNORMAL LOW (ref 8.9–10.3)
Chloride: 104 mmol/L (ref 98–111)
Creatinine, Ser: 0.81 mg/dL (ref 0.44–1.00)
GFR calc Af Amer: >60 mL/min (ref >60)
GFR calc non Af Amer: >60 mL/min (ref >60)
Glucose, Bld: 101 mg/dL — ABNORMAL HIGH (ref 70–99)
Potassium: 3.8 mmol/L (ref 3.5–5.1)
Sodium: 136 mmol/L (ref 135–145)
Total Bilirubin: 0.7 mg/dL (ref 0.3–1.2)
Total Protein: 6.4 g/dL — ABNORMAL LOW (ref 6.5–8.1)

## 2019-10-21 LAB — SURGICAL PATHOLOGY

## 2019-10-21 MED ORDER — SODIUM CHLORIDE 0.9 % IV SOLN
510.0000 mg | Freq: Once | INTRAVENOUS | Status: AC
  Administered 2019-10-21: 510 mg via INTRAVENOUS
  Filled 2019-10-21: qty 17

## 2019-10-21 MED ORDER — SODIUM CHLORIDE 0.9 % IV SOLN
Freq: Once | INTRAVENOUS | Status: AC
  Filled 2019-10-21: qty 250

## 2019-10-21 NOTE — Progress Notes (Signed)
Pt in for follow up, reports "feeling very drained".

## 2019-10-22 MED FILL — IMBRUVICA 420 MG TAB: 420 | 28 days supply | Qty: 28 | Fill #0

## 2019-10-25 ENCOUNTER — Telehealth: Payer: Self-pay | Admitting: Family Medicine

## 2019-10-25 DIAGNOSIS — M79605 Pain in left leg: Secondary | ICD-10-CM | POA: Diagnosis not present

## 2019-10-25 DIAGNOSIS — M79604 Pain in right leg: Secondary | ICD-10-CM | POA: Diagnosis not present

## 2019-10-25 NOTE — Telephone Encounter (Signed)
Referral Request - Has patient seen PCP for this complaint? Yes.   *If NO, is insurance requiring patient see PCP for this issue before PCP can refer them? Referral for which specialty: Dr. Laurence Ferrari Dermatologist  Preferred provider/office: Reason for referral: for a rash on her face\  Pt's CB#  [phone]

## 2019-10-27 ENCOUNTER — Ambulatory Visit (INDEPENDENT_AMBULATORY_CARE_PROVIDER_SITE_OTHER): Payer: Medicaid Other | Admitting: Family Medicine

## 2019-10-27 ENCOUNTER — Encounter: Payer: Self-pay | Admitting: Family Medicine

## 2019-10-27 DIAGNOSIS — F330 Major depressive disorder, recurrent, mild: Secondary | ICD-10-CM

## 2019-10-27 DIAGNOSIS — F4310 Post-traumatic stress disorder, unspecified: Secondary | ICD-10-CM | POA: Diagnosis not present

## 2019-10-27 DIAGNOSIS — L719 Rosacea, unspecified: Secondary | ICD-10-CM | POA: Diagnosis not present

## 2019-10-27 DIAGNOSIS — D229 Melanocytic nevi, unspecified: Secondary | ICD-10-CM | POA: Diagnosis not present

## 2019-10-27 MED ORDER — SERTRALINE HCL 100 MG PO TABS: 100.0000 mg | ORAL_TABLET | Freq: Every day | ORAL | 0 refills | Status: DC
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
## 2019-10-27 NOTE — Telephone Encounter (Signed)
Virtual appt scheduled for today.  °

## 2019-11-01 ENCOUNTER — Telehealth: Payer: Self-pay | Admitting: *Deleted

## 2019-11-01 NOTE — Telephone Encounter (Signed)
Patient called asking if she can take [employer]. Please advise

## 2019-11-01 NOTE — Telephone Encounter (Signed)
Call returned to patient and advised per Dr Grayland Ormond ok to take SkinnyFit Collagen

## 2019-11-01 NOTE — Telephone Encounter (Signed)
That's fine

## 2019-11-01 NOTE — Telephone Encounter (Signed)
No idea what that is.

## 2019-11-01 NOTE — Telephone Encounter (Signed)
Per patient, it is something to aide with weight loss and help with her skin From [employer] [place] the signs of aging with our powerful & scrumptious Chocolate Cake Super Youth multi-collagen powder. Made with 5 types of food source collagen that promote youthful looking skin, hair, and nails, improve bone and joint health, promote healthy weight, and support digestion."

## 2019-11-03 DIAGNOSIS — R519 Headache, unspecified: Secondary | ICD-10-CM | POA: Diagnosis not present

## 2019-11-03 DIAGNOSIS — M79605 Pain in left leg: Secondary | ICD-10-CM | POA: Diagnosis not present

## 2019-11-03 DIAGNOSIS — M79604 Pain in right leg: Secondary | ICD-10-CM | POA: Diagnosis not present

## 2019-11-05 ENCOUNTER — Ambulatory Visit: Payer: Self-pay

## 2019-11-05 ENCOUNTER — Telehealth: Payer: Self-pay | Admitting: Family Medicine

## 2019-11-05 NOTE — Telephone Encounter (Signed)
Contacted patient to inform her that her referral to dermatology can't be processed due [facility]'s name not being listed as the PCP on her Medicaid card.  Informed patient that she would need to contact social services to have this information updated and once she does this to contact our office so that referral can be processed.  Patient verbalized understanding.

## 2019-11-05 NOTE — Telephone Encounter (Signed)
Incoming call from Patient with a complaint of feeling a burning sensation from her throat down to end of Rib cage.  Patient states that She took all of her medication at once.  Patient states that it felt like some one blew air into her body.  States when  it first  Began she thought it was a heart attack.  .  Denies Difficulty breathing.  States the burning sensation is easing some.  Recommended if Pain continues to go to either Urgent care or Ed.  Patient voices understanding.     Reason for Disposition . Caller has medication question, adult has minor symptoms, caller declines triage, AND triager answers question  Protocols used: MEDICATION QUESTION CALL-A-AH

## 2019-11-05 NOTE — Telephone Encounter (Signed)
Pt called, states has since went away took tums.

## 2019-11-22 ENCOUNTER — Other Ambulatory Visit: Payer: Self-pay | Admitting: Family Medicine

## 2019-11-22 DIAGNOSIS — F330 Major depressive disorder, recurrent, mild: Secondary | ICD-10-CM

## 2019-11-22 DIAGNOSIS — F4310 Post-traumatic stress disorder, unspecified: Secondary | ICD-10-CM

## 2019-11-22 NOTE — Telephone Encounter (Signed)
Requested medication (s) are due for refill today: yes  Requested medication (s) are on the active medication list: yes  Last refill:  10/27/2019  Future visit scheduled: yes  Notes to clinic:  requesting a 90 day supply   Requested Prescriptions  Pending Prescriptions Disp Refills   sertraline (ZOLOFT) 100 MG tablet [Pharmacy Med Name: SERTRALINE HCL 100 MG TABLET] 90 tablet 1    Sig: TAKE 1 TABLET BY [place]      Psychiatry:  Antidepressants - SSRI Passed - 11/22/2019  8:30 AM      Passed - Valid encounter within last 6 months    Recent Outpatient Visits           3 weeks ago [facility] [name], MD   2 months ago Well adult exam   [facility] [name], MD   2 months ago Other emphysema [facility])   [facility] [name], MD   4 months ago Trigeminal neuralgia of left side of face   [facility] [name], MD   6 months ago Trigeminal neuralgia of right side of face   [facility] [name], MD       Future Appointments             In 2 months [name], MD [place]   In 3 months Ancil Boozer, Drue Stager, MD [facility], [facility]

## 2019-11-24 DIAGNOSIS — R200 Anesthesia of skin: Secondary | ICD-10-CM | POA: Diagnosis not present

## 2019-11-29 ENCOUNTER — Telehealth: Payer: Self-pay

## 2019-11-29 NOTE — Telephone Encounter (Signed)
Copied from [place] [id number]. Topic: General - Other >> Nov 29, 2019 11:30 AM Rainey Pines A wrote: Patient stated that referral to dermatology can be placed now that's she has gotten in contact with Medicaid. Please advise

## 2019-12-01 NOTE — Telephone Encounter (Signed)
I can only send the referral if you all have received the new card and it has been scanned into her chart.

## 2019-12-01 NOTE — Telephone Encounter (Signed)
Ok, sounds good. Referral is ready to be sent once card has been scanned.

## 2019-12-01 NOTE — Telephone Encounter (Signed)
Just spoke to the pt. She is waiting for DSS to send her the  New card with [facility] name on it. She will bring it by the office once she receives it.

## 2019-12-02 MED FILL — IMBRUVICA 420 MG TAB: 420 | 28 days supply | Qty: 28 | Fill #1

## 2019-12-30 MED FILL — IMBRUVICA 420 MG TAB: 420 | 28 days supply | Qty: 28 | Fill #2

## 2020-01-02 ENCOUNTER — Other Ambulatory Visit: Payer: Self-pay | Admitting: Family Medicine

## 2020-01-02 DIAGNOSIS — J438 Other emphysema: Secondary | ICD-10-CM

## 2020-01-02 NOTE — Telephone Encounter (Signed)
Requested Prescriptions  Pending Prescriptions Disp Refills  . SPIRIVA HANDIHALER 18 MCG inhalation capsule [Pharmacy Med Name: SPIRIVA 18 MCG CP-HANDIHALER] 30 capsule 3    Sig: INHALE 1 CAPSULE VIA HANDIHALER ONCE DAILY AT THE SAME TIME EVERY DAY     Pulmonology:  Anticholinergic Agents Passed - 01/02/2020 11:43 AM      Passed - Valid encounter within last 12 months    Recent Outpatient Visits          2 months ago [facility] [name], MD   4 months ago Well adult exam   [facility] [name], MD   4 months ago Other emphysema [facility])   [facility] [name], MD   5 months ago Trigeminal neuralgia of left side of face   [facility] [name], MD   8 months ago Trigeminal neuralgia of right side of face   [facility] [name], MD      Future Appointments            In 1 month [name], MD [facility]   In 1 month Ancil Boozer, Drue Stager, MD [facility], [facility]

## 2020-01-21 NOTE — Progress Notes (Signed)
Monique [name]  [phone] [phone]) [phone]  ID: Monique [name] OB: Jan 15, 1971  MR#: [id number]  QPY#:[id number]  Patient Care Team: [name], MD as PCP - General (Family Medicine) Lloyd Huger, MD as Consulting Physician (Oncology)  CHIEF COMPLAINT: CLL, iron deficiency.  INTERVAL HISTORY: Patient returns to clinic today for repeat laboratory work and routine 93-monthevaluation.  She continues to have neuropathy pain of unclear etiology that is actively being evaluated and treatment by neurology.  She otherwise feels well.  She continues to tolerate Imbruvica without significant side effects. She has no other neurologic complaints.  She denies any fevers or night sweats.  She has a good appetite and denies weight loss.  She has no chest pain, shortness of breath, cough, or hemoptysis.  She denies any nausea, vomiting, constipation, or diarrhea.  She has no melena or hematochezia.  She has no urinary complaints.  Patient offers no further specific complaints today.  REVIEW OF SYSTEMS:   Review of Systems  Constitutional: Negative for diaphoresis, fever, malaise/fatigue and weight loss.  HENT: Negative.  Negative for congestion.   Respiratory: Negative.  Negative for cough and shortness of breath.   Cardiovascular: Negative.  Negative for chest pain and leg swelling.  Gastrointestinal: Negative for abdominal pain and constipation.  Genitourinary: Negative.  Negative for dysuria and frequency.  Musculoskeletal: Negative.  Negative for back pain, myalgias and neck pain.  Skin: Negative.   Neurological: Positive for sensory change. Negative for tingling, focal weakness and weakness.  Psychiatric/Behavioral: Negative.  Negative for depression and suicidal ideas. The patient is not nervous/anxious.     As per HPI. Otherwise, a complete review of systems is negative.  PAST MEDICAL HISTORY: Past Medical History:  Diagnosis Date  . Anxiety   .  Bursitis    leg pain  . Carpal tunnel syndrome   . [place]    h[place] over 18 years ago.   . Cervical intraepithelial neoplasia I   . [place] lymphocytic leukemia ([place]   Dr FGrayland Ormond . Depression   . Eating disorder   . History of self-harm   . Insomnia   . Obsession   . Pap smear abnormality of cervix with LGSIL   . Tobacco abuse   . Vitamin B12 deficiency (non anemic)     PAST SURGICAL HISTORY: Past Surgical History:  Procedure Laterality Date  . BREAST BIOPSY Right 01/05/2018   [place] of 2 areas and 1 lymph node, MIXED INFLAMMATION AND GIANT CELL REACTION  . CERVICAL BIOPSY  W/ LOOP ELECTRODE EXCISION    . COLONOSCOPY WITH PROPOFOL N/A 04/21/2019   Procedure: COLONOSCOPY WITH PROPOFOL;  Surgeon: TVirgel Manifold MD;  Location: [facility] ENDOSCOPY;  Service: Gastroenterology;  Laterality: N/A;  . OTHER SURGICAL HISTORY     scar tissue removed from vocal cords  . TUBAL LIGATION      FAMILY HISTORY: Family History  Problem Relation Age of Onset  . Cancer Mother        thyroid  . Alcohol abuse Father   . Alcohol abuse Brother   . Depression Brother   . Bipolar disorder Brother   . ADD / ADHD Son   . Breast cancer Neg Hx     ADVANCED DIRECTIVES (Y/N):  N  HEALTH MAINTENANCE: Social History   Tobacco Use  . Smoking status: Current Every Day Smoker    Packs/day: 0.50    Years: 31.00    Pack years: 15.50  Types: Cigarettes    Start date: 09/25/1986  . Smokeless tobacco: Never Used  Substance Use Topics  . Alcohol use: Not Currently    Alcohol/week: 0.0 standard drinks    Comment: rarely  . Drug use: Not Currently    Types: Marijuana     Colonoscopy:  PAP:  Bone density:  Lipid panel:  No Known Allergies  Current Outpatient Medications  Medication Sig Dispense Refill  . albuterol (VENTOLIN HFA) 108 (90 Base) MCG/ACT inhaler Inhale 2 puffs into the lungs every 4 (four) hours as needed for wheezing or shortness of breath.  8.5 g 0  . atorvastatin (LIPITOR) 10 MG tablet Take 1 tablet (10 mg total) by mouth daily. 90 tablet 1  . Calcium Carbonate-Vit D-Min (GNP CALCIUM 1200) 1200-1000 MG-UNIT CHEW Chew 1,200 mg by mouth daily with breakfast. Take in combination with vitamin D and magnesium. 90 tablet 3  . DULoxetine (CYMBALTA) 20 MG capsule Take 1 capsule by mouth 3 (three) times daily.    . Fluticasone-Salmeterol (ADVAIR DISKUS) 250-50 MCG/DOSE AEPB Inhale 1 puff into the lungs 2 (two) times daily. 1 each 3  . gabapentin (NEURONTIN) 100 MG capsule Take 2-3 capsules (200-300 mg total) by mouth 3 (three) times daily. Follow titration schedule. (Patient taking differently: Take 1,200 mg by mouth 3 (three) times daily. Follow titration schedule.) 270 capsule 2  . IMBRUVICA 420 MG TABS TAKE 1 TABLET BY MOUTH DAILY. 28 tablet 2  . lidocaine-prilocaine (EMLA) cream Apply 1 application topically as needed. 30 g 1  . loratadine (CLARITIN) 10 MG tablet Take 1 tablet (10 mg total) by mouth every morning. 90 tablet 1  . Magnesium 500 MG TABS Take by mouth daily.    . meloxicam (MOBIC) 15 MG tablet Take 1 tablet (15 mg total) by mouth daily. 180 tablet 0  . omeprazole (PRILOSEC) 20 MG capsule Take 1 capsule (20 mg total) by mouth daily. 90 capsule 1  . sertraline (ZOLOFT) 100 MG tablet TAKE 1 TABLET BY MOUTH EVERY DAY 90 tablet 0  . SPIRIVA HANDIHALER 18 MCG inhalation capsule INHALE 1 CAPSULE VIA HANDIHALER ONCE DAILY AT THE SAME TIME EVERY DAY 30 capsule 3  . tiZANidine (ZANAFLEX) 2 MG tablet Take 1 tablet (2 mg total) by mouth every 8 (eight) hours as needed for muscle spasms. 90 tablet 5  . traZODone (DESYREL) 50 MG tablet Take 75 mg by mouth at bedtime as needed.     . metroNIDAZOLE (METROCREAM) 0.75 % cream APPLY A THIN LAYER TO FACE TWICE A DAY     No current facility-administered medications for this visit.    OBJECTIVE: Vitals:   01/27/20 1331  BP: 96/74  Pulse: 72  Resp: 20  Temp: (!) 96 F (35.6 C)  SpO2: 96%      Body mass index is 24.34 kg/m.    ECOG FS:0 - Asymptomatic  General: Well-developed, well-nourished, no acute distress. Eyes: Pink conjunctiva, anicteric sclera. HEENT: Normocephalic, moist mucous membranes. Lungs: No audible wheezing or coughing. Heart: Regular rate and rhythm. Abdomen: Soft, nontender, no obvious distention. Musculoskeletal: No edema, cyanosis, or clubbing. Neuro: Alert, answering all questions appropriately. Cranial nerves grossly intact. Skin: No rashes or petechiae noted. Psych: Normal affect.   LAB RESULTS:  Lab Results  Component Value Date   NA 137 01/27/2020   K 4.2 01/27/2020   CL 103 01/27/2020   CO2 27 01/27/2020   GLUCOSE 86 01/27/2020   BUN 17 01/27/2020   CREATININE 0.81 01/27/2020   CALCIUM  8.5 (L) 01/27/2020   PROT 6.7 01/27/2020   ALBUMIN 4.0 01/27/2020   AST 18 01/27/2020   ALT 14 01/27/2020   ALKPHOS 56 01/27/2020   BILITOT 0.6 01/27/2020   GFRNONAA >60 01/27/2020   GFRAA >60 01/27/2020    Lab Results  Component Value Date   WBC 12.0 (H) 01/27/2020   NEUTROABS 4.0 01/27/2020   HGB 15.7 (H) 01/27/2020   HCT 46.6 (H) 01/27/2020   MCV 95.3 01/27/2020   PLT 177 01/27/2020     STUDIES: No results found.  ASSESSMENT: CLL,  iron deficiency.  PLAN:    1. CLL: Confirmed by peripheral blood flow cytometry.  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  CT scan results from April 30, 2019 reviewed independently with continued improvement of patient's known lymphadenopathy.  Patient completed cycle 3 of Rituxan plus Treanda on March 26, 2018, but was noted to have progressive disease therefore initiated 480 mg Imbruvica daily.  Continue current treatment indefinitely until progression of disease or intolerable side effects.  No further intervention is needed at this time.  Return to clinic in 3 months with repeat laboratory work and further evaluation.  Will  also repeat CT scan of chest, abdomen, and pelvis at that time for yearly update.   2.  Leukocytosis: White blood cell count has trended up slightly to 12.0.  Continue Imbruvica as above. 3.  Trigeminal neuralgia: Patient does not complain of this today.  Appears to be unrelated to CLL or Imbruvica.  MRI of the brain on Jan 25, 2019 was unremarkable.   4.  Depression/anxiety: Chronic.  Continue follow-up and treatment per primary care. 5.  Neuropathic pain: Continue gabapentin and follow-up with neurology as indicated. 6.  Iron deficiency: Resolved.  Patient received 1 dose of IV Feraheme on October 21, 2019.  Patient expressed understanding and was in agreement with this plan. She also understands that She can call clinic at any time with any questions, concerns, or complaints.    Lloyd Huger, MD   01/28/2020 11:42 AM

## 2020-01-24 ENCOUNTER — Other Ambulatory Visit: Payer: Self-pay | Admitting: Oncology

## 2020-01-24 ENCOUNTER — Other Ambulatory Visit: Payer: Medicaid Other

## 2020-01-24 ENCOUNTER — Ambulatory Visit: Payer: Medicaid Other | Admitting: Oncology

## 2020-01-24 DIAGNOSIS — C9110 Chronic lymphocytic leukemia of B-cell type not having achieved remission: Secondary | ICD-10-CM

## 2020-01-26 ENCOUNTER — Other Ambulatory Visit: Payer: Self-pay

## 2020-01-26 ENCOUNTER — Encounter: Payer: Self-pay | Admitting: Oncology

## 2020-01-26 NOTE — Progress Notes (Signed)
Pt reports that she has a knot on the back of her left leg thigh that has been there for a couple of months, about the size of a penny, nonpainful, nonmobile. Has been having neuropathy in legs and feet, as well as bilateral hand and arms.

## 2020-01-27 ENCOUNTER — Encounter: Payer: Self-pay | Admitting: Oncology

## 2020-01-27 ENCOUNTER — Inpatient Hospital Stay: Payer: Medicaid Other

## 2020-01-27 ENCOUNTER — Inpatient Hospital Stay: Payer: Medicaid Other | Admitting: Pharmacist

## 2020-01-27 ENCOUNTER — Inpatient Hospital Stay: Payer: Medicaid Other | Attending: Oncology | Admitting: Oncology

## 2020-01-27 VITALS — BP 96/74 | HR 72 | Temp 96.0°F | Resp 20 | Wt 155.4 lb

## 2020-01-27 DIAGNOSIS — F17210 Nicotine dependence, cigarettes, uncomplicated: Secondary | ICD-10-CM | POA: Insufficient documentation

## 2020-01-27 DIAGNOSIS — C9110 Chronic lymphocytic leukemia of B-cell type not having achieved remission: Secondary | ICD-10-CM

## 2020-01-27 DIAGNOSIS — F418 Other specified anxiety disorders: Secondary | ICD-10-CM | POA: Insufficient documentation

## 2020-01-27 DIAGNOSIS — G629 Polyneuropathy, unspecified: Secondary | ICD-10-CM | POA: Insufficient documentation

## 2020-01-27 LAB — COMPREHENSIVE METABOLIC PANEL
ALT: 14 U/L (ref 0–44)
AST: 18 U/L (ref 15–41)
Albumin: 4 g/dL (ref 3.5–5.0)
Alkaline Phosphatase: 56 U/L (ref 38–126)
Anion gap: 7 (ref 5–15)
BUN: 17 mg/dL (ref 6–20)
CO2: 27 mmol/L (ref 22–32)
Calcium: 8.5 mg/dL — ABNORMAL LOW (ref 8.9–10.3)
Chloride: 103 mmol/L (ref 98–111)
Creatinine, Ser: 0.81 mg/dL (ref 0.44–1.00)
GFR calc Af Amer: >60 mL/min (ref >60)
GFR calc non Af Amer: >60 mL/min (ref >60)
Glucose, Bld: 86 mg/dL (ref 70–99)
Potassium: 4.2 mmol/L (ref 3.5–5.1)
Sodium: 137 mmol/L (ref 135–145)
Total Bilirubin: 0.6 mg/dL (ref 0.3–1.2)
Total Protein: 6.7 g/dL (ref 6.5–8.1)

## 2020-01-27 LAB — CBC WITH DIFFERENTIAL/PLATELET
Abs Immature Granulocytes: 0.07 10*3/uL (ref 0.00–0.07)
Basophils Absolute: 0.1 10*3/uL (ref 0.0–0.1)
Basophils Relative: 1 %
Eosinophils Absolute: 0.1 10*3/uL (ref 0.0–0.5)
Eosinophils Relative: 1 %
HCT: 46.6 % — ABNORMAL HIGH (ref 36.0–46.0)
Hemoglobin: 15.7 g/dL — ABNORMAL HIGH (ref 12.0–15.0)
Immature Granulocytes: 1 %
Lymphocytes Relative: 56 %
Lymphs Abs: 6.8 10*3/uL — ABNORMAL HIGH (ref 0.7–4.0)
MCH: 32.1 pg (ref 26.0–34.0)
MCHC: 33.7 g/dL (ref 30.0–36.0)
MCV: 95.3 fL (ref 80.0–100.0)
Monocytes Absolute: 0.9 10*3/uL (ref 0.1–1.0)
Monocytes Relative: 8 %
Neutro Abs: 4 10*3/uL (ref 1.7–7.7)
Neutrophils Relative %: 33 %
Platelets: 177 10*3/uL (ref 150–400)
RBC: 4.89 MIL/uL (ref 3.87–5.11)
RDW: 14.6 % (ref 11.5–15.5)
WBC: 12 10*3/uL — ABNORMAL HIGH (ref 4.0–10.5)
nRBC: 0 % (ref 0.0–0.2)

## 2020-01-27 NOTE — Progress Notes (Signed)
[place]  [phone] [phone]  Patient Care Team: [name], MD as PCP - General (Family Medicine) Lloyd Huger, MD as Consulting Physician (Oncology)   Name of the patient: Monique [name]  [id number]  [birth year]   Date of visit: 01/27/20  HPI: Patient is a 49 y.o. female with CLL, treated with ibrutinib since 07/2018.  Reason for Consult: Oral chemotherapy follow-up for ibrutinib therapy.  PAST MEDICAL HISTORY: Past Medical History:  Diagnosis Date  . Anxiety   . Bursitis    leg pain  . Carpal tunnel syndrome   . Cervical cancer ([place])    hx LEEP over 18 years ago.   . Cervical intraepithelial neoplasia I   . Chronic lymphocytic leukemia ([place]) 2018   Dr Grayland Ormond  . Depression   . Eating disorder   . History of self-harm   . Insomnia   . Obsession   . Pap smear abnormality of cervix with LGSIL   . Tobacco abuse   . Vitamin B12 deficiency (non anemic)     PAST SURGICAL HISTORY:  Past Surgical History:  Procedure Laterality Date  . BREAST BIOPSY Right 01/05/2018   US guided biopsy of 2 areas and 1 lymph node, MIXED INFLAMMATION AND GIANT CELL REACTION  . CERVICAL BIOPSY  W/ LOOP ELECTRODE EXCISION    . COLONOSCOPY WITH PROPOFOL N/A 04/21/2019   Procedure: COLONOSCOPY WITH PROPOFOL;  Surgeon: Virgel Manifold, MD;  Location: [facility] ENDOSCOPY;  Service: Gastroenterology;  Laterality: N/A;  . OTHER SURGICAL HISTORY     scar tissue removed from vocal cords  . TUBAL LIGATION      HEMATOLOGY/ONCOLOGY HISTORY:  Oncology History Overview Note  Patient with recent CLL diagnosis by flow cytometry.  Previously not being treated in observation.  S/p 4 cycles of weekly Rituxan completing on 02/20/2017 scans from January 2018 revealed minimal progression.  Developed right breast mass in March 2019.  Had diagnostic mammogram performed on 12/26/2017 showed suspicious right breast mass.  Biopsy  revealed granulomatous mastitis.  She was treated with doxycycline 100 mg twice daily for 7 days.  Developed worsening adenopathy.  CT chest/abdomen/pelvis and soft tissue neck from May 2019 revealed progressive bulky adenopathy in the chest, abdomen and pelvis compatible with worsening disease.  [name] recommended beginning treatment with Rituxan and Treanda.  Received first cycle last week.  S/p 6 cycles of Rituxan and Treanda.  Tolerating well.  Last dose given 03/25/18.   CLL (chronic lymphocytic leukemia) ([place])  11/24/2016 Initial Diagnosis   CLL (chronic lymphocytic leukemia) ([place])   01/20/2018 -  Chemotherapy   The patient had palonosetron (ALOXI) injection 0.25 mg, 0.25 mg, Intravenous,  Once, 4 of 5 cycles Administration: 0.25 mg (01/28/2018), 0.25 mg (02/25/2018), 0.25 mg (03/25/2018) riTUXimab (RITUXAN) 700 mg in sodium chloride 0.9 % 250 mL (2.1875 mg/mL) infusion, 375 mg/m2 = 700 mg, Intravenous,  Once, 4 of 5 cycles Administration: 700 mg (01/28/2018) bendamustine (BENDEKA) 150 mg in sodium chloride 0.9 % 50 mL (2.6786 mg/mL) chemo infusion, 90 mg/m2 = 150 mg, Intravenous,  Once, 4 of 5 cycles Administration: 150 mg (01/28/2018), 150 mg (01/29/2018), 150 mg (02/25/2018), 150 mg (02/26/2018), 150 mg (03/25/2018), 150 mg (03/26/2018)  for chemotherapy treatment.      ALLERGIES:  has No Known Allergies.  MEDICATIONS:  Current Outpatient Medications  Medication Sig Dispense Refill  . albuterol (VENTOLIN HFA) 108 (90 Base) MCG/ACT inhaler Inhale 2 puffs into the lungs every 4 (four) hours  as needed for wheezing or shortness of breath. 8.5 g 0  . atorvastatin (LIPITOR) 10 MG tablet Take 1 tablet (10 mg total) by mouth daily. 90 tablet 1  . Calcium Carbonate-Vit D-Min (GNP CALCIUM 1200) 1200-1000 MG-UNIT CHEW Chew 1,200 mg by mouth daily with breakfast. Take in combination with vitamin D and magnesium. 90 tablet 3  . DULoxetine (CYMBALTA) 20 MG capsule Take 1 capsule by mouth 3  (three) times daily.    . Fluticasone-Salmeterol (ADVAIR DISKUS) 250-50 MCG/DOSE AEPB Inhale 1 puff into the lungs 2 (two) times daily. 1 each 3  . gabapentin (NEURONTIN) 100 MG capsule Take 2-3 capsules (200-300 mg total) by mouth 3 (three) times daily. Follow titration schedule. (Patient taking differently: Take 1,200 mg by mouth 3 (three) times daily. Follow titration schedule.) 270 capsule 2  . IMBRUVICA 420 MG TABS TAKE 1 TABLET BY MOUTH DAILY. 28 tablet 2  . lidocaine-prilocaine (EMLA) cream Apply 1 application topically as needed. 30 g 1  . loratadine (CLARITIN) 10 MG tablet Take 1 tablet (10 mg total) by mouth every morning. 90 tablet 1  . Magnesium 500 MG TABS Take by mouth daily.    . meloxicam (MOBIC) 15 MG tablet Take 1 tablet (15 mg total) by mouth daily. 180 tablet 0  . metroNIDAZOLE (METROCREAM) 0.75 % cream APPLY A THIN LAYER TO FACE TWICE A DAY    . omeprazole (PRILOSEC) 20 MG capsule Take 1 capsule (20 mg total) by mouth daily. 90 capsule 1  . sertraline (ZOLOFT) 100 MG tablet TAKE 1 TABLET BY MOUTH EVERY DAY 90 tablet 0  . SPIRIVA HANDIHALER 18 MCG inhalation capsule INHALE 1 CAPSULE VIA HANDIHALER ONCE DAILY AT THE SAME TIME EVERY DAY 30 capsule 3  . tiZANidine (ZANAFLEX) 2 MG tablet Take 1 tablet (2 mg total) by mouth every 8 (eight) hours as needed for muscle spasms. 90 tablet 5  . traZODone (DESYREL) 50 MG tablet Take 75 mg by mouth at bedtime as needed.      No current facility-administered medications for this visit.    VITAL SIGNS: There were no vitals taken for this visit. There were no vitals filed for this visit.  Estimated body mass index is 24.34 kg/m as calculated from the following:   Height as of 10/19/19: 5\' 7"  (1.702 m).   Weight as of an earlier encounter on 01/27/20: 70.5 kg (155 lb 6.4 oz).  LABS: CBC:    Component Value Date/Time   WBC 12.0 (H) 01/27/2020 1304   HGB 15.7 (H) 01/27/2020 1304   HCT 46.6 (H) 01/27/2020 1304   PLT 177 01/27/2020 1304    MCV 95.3 01/27/2020 1304   NEUTROABS 4.0 01/27/2020 1304   LYMPHSABS 6.8 (H) 01/27/2020 1304   MONOABS 0.9 01/27/2020 1304   EOSABS 0.1 01/27/2020 1304   BASOSABS 0.1 01/27/2020 1304   Comprehensive Metabolic Panel:    Component Value Date/Time   NA 137 01/27/2020 1304   NA 138 05/13/2018 1413   K 4.2 01/27/2020 1304   CL 103 01/27/2020 1304   CO2 27 01/27/2020 1304   BUN 17 01/27/2020 1304   BUN 15 05/13/2018 1413   CREATININE 0.81 01/27/2020 1304   CREATININE 1.07 12/04/2017 1156   GLUCOSE 86 01/27/2020 1304   CALCIUM 8.5 (L) 01/27/2020 1304   AST 18 01/27/2020 1304   ALT 14 01/27/2020 1304   ALKPHOS 56 01/27/2020 1304   BILITOT 0.6 01/27/2020 1304   BILITOT 0.4 05/13/2018 1413   PROT 6.7 01/27/2020  1304   PROT 6.3 05/13/2018 1413   ALBUMIN 4.0 01/27/2020 1304   ALBUMIN 4.1 05/13/2018 1413    RADIOGRAPHIC STUDIES: No results found.   Assessment and Plan-  Reviewed patient CMP and CBC, no relevant lab abnormalities that would require dose adjustments.   Oral Chemotherapy Side Effect/Intolerance: Patient reported occasional nausea. I asked is she takes her ibrutinib on an empty stomach, she said yes and that when she does take it her ibrutinib and other morning medications with food it does help prevent the nausea. I suggested she take her morning medications with at least a snack when possible.  Patient also reported really tired at times but she attributes this to her gabapentin which she takes 3 times daily.  Oral Chemotherapy Adherence: Patient reported no missed doses so far this month. She stated she removed the ibrutinib from the blister pack and places them in daily medication bags.   Medication Access Issues: no issues reported, Her ibrutinib is scheduled to be delivered from [facility] tomorrow.  Patient expressed understanding and was in agreement with this plan. She also understands that She can call clinic at any time with any questions,  concerns, or complaints.   Thank you for allowing me to participate in the care of this very pleasant patient.   Time Total: 10 mins  Visit consisted of counseling and education on dealing with issues of symptom management in the setting of serious and potentially life-threatening illness.Greater than 50%  of this time was spent counseling and coordinating care related to the above assessment and plan.  Signed by: [name], PharmD, BCPS, Salley Slaughter, CPP Hematology/Oncology Clinical Pharmacist Practitioner [facility]/HP/AP [facility] [phone]  01/27/2020 2:18 PM

## 2020-01-31 ENCOUNTER — Other Ambulatory Visit: Payer: Self-pay | Admitting: Family Medicine

## 2020-01-31 DIAGNOSIS — J438 Other emphysema: Secondary | ICD-10-CM

## 2020-02-04 ENCOUNTER — Other Ambulatory Visit: Payer: Self-pay | Admitting: Family Medicine

## 2020-02-04 DIAGNOSIS — L299 Pruritus, unspecified: Secondary | ICD-10-CM

## 2020-02-04 NOTE — Telephone Encounter (Signed)
Requested Prescriptions  Pending Prescriptions Disp Refills  . loratadine (CLARITIN) 10 MG tablet [Pharmacy Med Name: LORATADINE 10 MG TABLET] 90 tablet 1    Sig: TAKE 1 TABLET BY MOUTH ONCE DAILY IN THE MORNING     Ear, Nose, and Throat:  Antihistamines Passed - 02/04/2020 11:43 AM      Passed - Valid encounter within last 12 months    Recent Outpatient Visits          3 months ago [name], [facility]   5 months ago Well adult exam   [facility] [name], MD   5 months ago Other emphysema [facility])   [facility] [name], MD   7 months ago Trigeminal neuralgia of left side of face   [facility] [name], MD   9 months ago Trigeminal neuralgia of right side of face   [facility] [name], MD      Future Appointments            In 5 days [name], MD [place]   In 3 weeks Ancil Boozer, Drue Stager, MD [facility], [facility]

## 2020-02-07 NOTE — Progress Notes (Addendum)
Patient: Monique [name]  Service Category: E/M  Provider: Gaspar Cola, MD  DOB: August 25, 1971  DOS: 02/09/2020  Location: Office  MRN: [id number]  Setting: Ambulatory outpatient  Referring Provider: [name], MD  Type: Established Patient  Specialty: Interventional Pain Management  PCP: [name], MD  Location: Remote location  Delivery: TeleHealth     Virtual Encounter - Pain Management PROVIDER NOTE: Information contained herein reflects review and annotations entered in association with encounter. Interpretation of such information and data should be left to medically-trained personnel. Information provided to patient can be located elsewhere in the medical record under "Patient Instructions". Document created using STT-dictation technology, any transcriptional errors that may result from process are unintentional.    Contact & Pharmacy Preferred: [place]: [phone] (home) Mobile: [phone] (mobile) E-mail: [email or web address]  [id number] [place] [id number]: [phone]: [phone]  Pre-screening  Ms. SBarreiraoffered "in-person" vs "virtual" encounter. She indicated preferring virtual for this encounter.   Reason COVID-19*  Social distancing based on CDC and AMA recommendations.   I contacted Monique Hockeyon 02/09/2020 via telephone.      I clearly identified myself as FGaspar Cola MD. I verified that I was speaking with the correct person using two identifiers (Name: TNikolette [name] and date of birth: [birth year].  Consent I sought verbal advanced consent from Monique Hockeyfor virtual visit interactions. I informed Ms. SSzymborskiof possible security and privacy concerns, risks, and limitations associated with providing "not-in-person" medical evaluation and management services. I also informed Ms. SRuppelof the availability of "in-person" appointments. Finally, I  informed her that there would be a charge for the virtual visit and that she could be  personally, fully or partially, financially responsible for it. Ms. SOquinexpressed understanding and agreed to proceed.   Historic Elements   Ms. TKabrina Christianois a 49y.o. year old, female patient evaluated today after her last contact with our practice on 08/23/2019. Monique [name] has a past medical history of Anxiety, Bursitis, Carpal tunnel syndrome, [place]ervical [place], Cervical intraepithelial neoplasia I, Chronic lymphocytic leukemia ([place] (2018), Depression, Eating disorder, History of self-harm, Insomnia, Obsession, Pap smear abnormality of cervix with LGSIL, Tobacco abuse, and Vitamin B12 deficiency (non anemic). She also  has a past surgical history that includes Cervical biopsy w/ loop electrode excision; Tubal ligation; OTHER SURGICAL HISTORY; Colonoscopy with propofol (N/A, 04/21/2019); and Breast biopsy (Right, 01/05/2018). Ms. SBuseyhas a current medication list which includes the following prescription(s): advair diskus, albuterol, atorvastatin, gnp calcium 1200, duloxetine, imbruvica, lidocaine-prilocaine, loratadine, magnesium, meloxicam, omeprazole, sertraline, spiriva handihaler, trazodone, vitamelts energy vitamin b-12, gabapentin, and tizanidine. She  reports that she has been smoking cigarettes. She started smoking about 33 years ago. She has a 15.50 pack-year smoking history. She has never used smokeless tobacco. She reports previous alcohol use. She reports previous drug use. Drug: Marijuana. Ms. SDearcoshas No Known Allergies.   HPI  Today, she is being contacted for medication management.  Today I have requested clarification on how she is taking her gabapentin.  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 PMelrose Nakayamasuggested to her to increase her gabapentin and she is now taking 400 mg 3 times a day for a total of 1200 mg/day.  Patient  refers feeling somewhat tired because of the dose.  Today we had a  really long conversation since he was under the impression that we were giving her the gabapentin for her elbow bursitis.  I clarified for her that we were providing her prescriptions for the gabapentin for the purpose of treating her chronic neuropathic pain.  I then had to explain about that and look for the report on the EMG/PNCV done by Monique [name] since the patient indicated that she understood him to say that she would be ending up in a wheelchair.  I also reminded the patient that previously she had been diagnosed with a vitamin B12 deficiency which may be responsible for peripheral neuropathies.  I also pointed out that it is the only one that has a potential to improve or be reversed by taking supplemental vitamin B12.  Today I have given her some guidance as to how much she needs to take and I have reminded her that she needs to take it regularly.  Today we ended up reviewing a lot of her lab work and studies in order for me to be able to clarify for her that her current condition although it is chronic and it can progress, it does not necessarily mean that she will end up in a wheelchair.  She indicates currently having bilateral lower extremity burning sensations, which is a typical symptom of neuropathic pain.  However, neuropathic pain can occur because of many possible reasons.  The nerve conduction test suggested that she has that sensory polyneuropathy, but she also has some abnormalities at the level of her cervical and lumbar spine that could account for neurogenic pain.  Pharmacotherapy Assessment  Analgesic: No opioid analgesics prescribed by our practice. See 05/13/2018 UDS (+) Undeclared THC.   Monitoring: [place] PMP: PDMP reviewed during this encounter.       Pharmacotherapy: No side-effects or adverse reactions reported. Compliance: No problems identified. Effectiveness: Clinically acceptable. Plan: Refer to  "POC".  UDS:  Summary  Date Value Ref Range Status  05/13/2018 FINAL  Final    Comment:    ==================================================================== TOXASSURE COMP DRUG ANALYSIS,UR ==================================================================== Test                             Result       Flag       Units Drug Present and Declared for Prescription Verification   Gabapentin                     PRESENT      EXPECTED Drug Present not Declared for Prescription Verification   Carboxy-THC                    213          UNEXPECTED ng/mg creat    Carboxy-THC is a metabolite of tetrahydrocannabinol  (THC).    Source of [facility] is most commonly illicit, but THC is also present    in a scheduled prescription medication.   Naproxen                       PRESENT      UNEXPECTED Drug Absent but Declared for Prescription Verification   Hydrocodone                    Not Detected UNEXPECTED ng/mg creat   Tramadol  Not Detected UNEXPECTED ng/mg creat   Citalopram                     Not Detected UNEXPECTED   Prochlorperazine               Not Detected UNEXPECTED   Acetaminophen                  Not Detected UNEXPECTED    Acetaminophen, as indicated in the declared medication list, is    not always detected even when used as directed. ==================================================================== Test                      Result    Flag   Units      Ref Range   Creatinine              218              mg/dL      >=20 ==================================================================== Declared Medications:  The flagging and interpretation on this report are based on the  following declared medications.  Unexpected results may arise from  inaccuracies in the declared medications.  **Note: The testing scope of this panel includes these medications:  Escitalopram (Lexapro)  Gabapentin (Neurontin)  Hydrocodone (Norco)  Prochlorperazine (Compazine)  Tramadol  (Ultram)  **Note: The testing scope of this panel does not include small to  moderate amounts of these reported medications:  Acetaminophen (Norco)  **Note: The testing scope of this panel does not include following  reported medications:  Umeclidinium (Anoro Ellipta)  Varenicline (Chantix)  Vilanterol (Anoro Ellipta) ==================================================================== For clinical consultation, please call [phone]. ====================================================================    Laboratory Chemistry Profile   Renal Lab Results  Component Value Date   BUN 17 01/27/2020   CREATININE 0.81 01/27/2020   BCR 22 05/13/2018   GFRAA >60 01/27/2020   GFRNONAA >60 01/27/2020     Hepatic Lab Results  Component Value Date   AST 18 01/27/2020   ALT 14 01/27/2020   ALBUMIN 4.0 01/27/2020   ALKPHOS 56 01/27/2020   HCVAB <0.1 01/22/2017     Electrolytes Lab Results  Component Value Date   NA 137 01/27/2020   K 4.2 01/27/2020   CL 103 01/27/2020   CALCIUM 8.5 (L) 01/27/2020   MG 2.1 01/27/2019   PHOS 2.7 01/27/2019     Bone Lab Results  Component Value Date   VD25OH 51 08/31/2019   25OHVITD1 27 (L) 05/13/2018   25OHVITD2 <1.0 05/13/2018   25OHVITD3 27 05/13/2018     Inflammation (CRP: Acute Phase) (ESR: Chronic Phase) Lab Results  Component Value Date   CRP 0.7 08/21/2018   ESRSEDRATE 2 08/21/2018       Note: Above Lab results reviewed.  Imaging  MM DIAG BREAST TOMO BILATERAL CLINICAL DATA:  49 year old female with history of 2 benign core biopsies of the RIGHT breast in 2019. At that time, surgical consultation was recommended given increase in size.  EXAM: DIGITAL DIAGNOSTIC BILATERAL MAMMOGRAM WITH CAD AND TOMO  COMPARISON:  Previous exam(s).  ACR Breast Density Category b: There are scattered areas of fibroglandular density.  FINDINGS: 2D/3D full field views of both breasts demonstrate no suspicious mass, distortion or  worrisome calcifications. The masses within the RIGHT breast which were biopsied in 2019 are now much smaller and much less conspicuous. Biopsy clips in these areas again noted.  Mammographic images were processed with CAD.  IMPRESSION: No mammographic evidence of breast malignancy.  No further follow-up of RIGHT breast biopsies recommended.  RECOMMENDATION: Bilateral screening mammogram in 1 year.  I have discussed the findings and recommendations with the patient. If applicable, a reminder letter will be sent to the patient regarding the next appointment.  BI-RADS CATEGORY  2: Benign.  Electronically Signed   By: Margarette Canada M.D.   On: 09/13/2019 11:22  Assessment  The primary encounter diagnosis was Chronic pain syndrome. Diagnoses of Chronic lower extremity pain (Primary Area of Pain) (Bilateral) (R>L), Chronic low back pain (Secondary Area of Pain) (Bilateral) (R>L) w/ sciatica (Bilateral), Chronic musculoskeletal pain, Chronic neuropathic pain, Trigeminal neuralgia, Lumbar facet arthropathy, 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), and B12 deficiency were also pertinent to this visit.  Plan of Care  Problem-specific:  No problem-specific Assessment & Plan notes found for this encounter.  Ms. Wing Gfeller has a current medication list which includes the following long-term medication(s): advair diskus, albuterol, atorvastatin, gnp calcium 1200, loratadine, meloxicam, omeprazole, sertraline, spiriva handihaler, trazodone, gabapentin, and tizanidine.  Pharmacotherapy (Medications Ordered): Meds ordered this encounter  Medications  . tiZANidine (ZANAFLEX) 2 MG tablet    Sig: Take 1 tablet (2 mg total) by mouth every 8 (eight) hours as needed for muscle spasms.    Dispense:  90 tablet    Refill:  5    Fill one day early if pharmacy is closed on scheduled refill date. May substitute for generic if available.  . gabapentin (NEURONTIN) 400 MG capsule    Sig: Take 1 capsule (400  mg total) by mouth 3 (three) times daily. Follow titration schedule.    Dispense:  90 capsule    Refill:  2    Fill one day early if pharmacy is closed on scheduled refill date. May substitute for generic if available.  . meloxicam (MOBIC) 15 MG tablet    Sig: Take 1 tablet (15 mg total) by mouth daily.    Dispense:  90 tablet    Refill:  1    Do not add this medication to the electronic "Automatic Refill" notification system. Patient may have prescription filled one day early if pharmacy is closed on scheduled refill date.  . Cyanocobalamin (VITAMELTS ENERGY VITAMIN B-12) 1500 MCG TBDP    Sig: Take 1 tablet by mouth daily.    Dispense:  90 tablet    Refill:  0    OTC Medication. Do not place medication on "Automatic Refill". Fill one day early if pharmacy is closed on scheduled refill date.   Orders:  No orders of the defined types were placed in this encounter.  Follow-up plan:   Return in about 6 months (around 08/07/2020) for (F2F), (MM).      Interventional management options: Planned, scheduled, and/or pending:   None at this time.   Considering:   Diagnostic/therapeutic right L3-4 LESI #3  Diagnostic/therapeutic right L4 TFESI #3  Diagnostic bilateral lumbar facet nerve block #1 Possible bilateral lumbar facet RFA   Palliative PRN treatment(s):   Palliative right L4 TFESI + right L3-4 LESI(IV sedation preferred)      Recent Visits No visits were found meeting these conditions.  Showing recent visits within past 90 days and meeting all other requirements   Today's Visits Date Type Provider Dept  02/09/20 Telemedicine [name], MD [facility]  Showing today's visits and meeting all other requirements   Future Appointments No visits were found meeting these conditions.  Showing future appointments within next 90 days and meeting all other requirements  I discussed the assessment and treatment plan with the patient. The patient was provided  an opportunity to ask questions and all were answered. The patient agreed with the plan and demonstrated an understanding of the instructions.  Patient advised to call back or seek an in-person evaluation if the symptoms or condition worsens.  Duration of encounter: 25 minutes.  Note by: Gaspar Cola, MD Date: 02/09/2020; Time: 8:59 AM

## 2020-02-09 ENCOUNTER — Other Ambulatory Visit: Payer: Self-pay

## 2020-02-09 ENCOUNTER — Ambulatory Visit: Payer: Medicaid Other | Attending: Pain Medicine | Admitting: Pain Medicine

## 2020-02-09 DIAGNOSIS — E538 Deficiency of other specified B group vitamins: Secondary | ICD-10-CM

## 2020-02-09 DIAGNOSIS — G894 Chronic pain syndrome: Secondary | ICD-10-CM | POA: Diagnosis not present

## 2020-02-09 DIAGNOSIS — M79605 Pain in left leg: Secondary | ICD-10-CM

## 2020-02-09 DIAGNOSIS — M7918 Myalgia, other site: Secondary | ICD-10-CM | POA: Diagnosis not present

## 2020-02-09 DIAGNOSIS — M5442 Lumbago with sciatica, left side: Secondary | ICD-10-CM | POA: Diagnosis not present

## 2020-02-09 DIAGNOSIS — M47816 Spondylosis without myelopathy or radiculopathy, lumbar region: Secondary | ICD-10-CM

## 2020-02-09 DIAGNOSIS — G608 Other hereditary and idiopathic neuropathies: Secondary | ICD-10-CM

## 2020-02-09 DIAGNOSIS — G500 Trigeminal neuralgia: Secondary | ICD-10-CM | POA: Diagnosis not present

## 2020-02-09 DIAGNOSIS — M79604 Pain in right leg: Secondary | ICD-10-CM

## 2020-02-09 DIAGNOSIS — G8929 Other chronic pain: Secondary | ICD-10-CM

## 2020-02-09 DIAGNOSIS — M792 Neuralgia and neuritis, unspecified: Secondary | ICD-10-CM | POA: Diagnosis not present

## 2020-02-09 DIAGNOSIS — M5441 Lumbago with sciatica, right side: Secondary | ICD-10-CM

## 2020-02-09 MED ORDER — MELOXICAM 15 MG PO TABS: 15.0000 mg | ORAL_TABLET | Freq: Every day | ORAL | 1 refills | Status: DC

## 2020-02-09 MED ORDER — VITAMELTS ENERGY VITAMIN B-12 1500 MCG PO TBDP: 1.0000 | ORAL_TABLET | Freq: Every day | ORAL | 0 refills | Status: AC

## 2020-02-09 MED ORDER — TIZANIDINE HCL 2 MG PO TABS: 2.0000 mg | ORAL_TABLET | Freq: Three times a day (TID) | ORAL | 5 refills | Status: DC | PRN

## 2020-02-09 MED ORDER — GABAPENTIN 400 MG PO CAPS: 400.0000 mg | ORAL_CAPSULE | Freq: Three times a day (TID) | ORAL | 2 refills | Status: DC

## 2020-02-09 NOTE — Patient Instructions (Signed)
____________________________________________________________________________________________  Muscle Spasms & Cramps  Cause:  The most common cause of muscle spasms and cramps is vitamin and/or electrolyte (calcium, potassium, sodium, etc.) deficiencies.  Possible triggers: Sweating - causes loss of electrolytes thru the skin. Steroids - causes loss of electrolytes thru the urine.  Treatment: 1. Gatorade (or any other electrolyte-replenishing drink) - Take 1, 8 oz glass with each meal (3 times a day). 2. OTC (over-the-counter) Magnesium 400 to 500 mg - Take 1 tablet twice a day (one with breakfast and one before bedtime). If you have kidney problems, talk to your primary care physician before taking any Magnesium. 3. Tonic Water with quinine - Take 1, 8 oz glass before bedtime.   ____________________________________________________________________________________________    

## 2020-02-28 ENCOUNTER — Other Ambulatory Visit: Payer: Self-pay | Admitting: Family Medicine

## 2020-02-28 DIAGNOSIS — J438 Other emphysema: Secondary | ICD-10-CM

## 2020-02-28 NOTE — Telephone Encounter (Signed)
Last ordered on 01/31/20 with one refill.  Taken twice daily, one diskus should last one month. Should have a refill on file to last until 03/02/20. Refusing due to requesting too soon.

## 2020-02-29 ENCOUNTER — Ambulatory Visit: Payer: Medicaid Other | Admitting: Family Medicine

## 2020-02-29 ENCOUNTER — Other Ambulatory Visit: Payer: Self-pay

## 2020-02-29 ENCOUNTER — Encounter: Payer: Self-pay | Admitting: Family Medicine

## 2020-02-29 VITALS — BP 82/62 | HR 98 | Temp 97.9°F | Resp 16 | Ht 67.0 in | Wt 153.2 lb

## 2020-02-29 DIAGNOSIS — F4310 Post-traumatic stress disorder, unspecified: Secondary | ICD-10-CM | POA: Diagnosis not present

## 2020-02-29 DIAGNOSIS — J432 Centrilobular emphysema: Secondary | ICD-10-CM

## 2020-02-29 DIAGNOSIS — I700 Atherosclerosis of aorta: Secondary | ICD-10-CM

## 2020-02-29 DIAGNOSIS — R42 Dizziness and giddiness: Secondary | ICD-10-CM | POA: Diagnosis not present

## 2020-02-29 DIAGNOSIS — E538 Deficiency of other specified B group vitamins: Secondary | ICD-10-CM | POA: Diagnosis not present

## 2020-02-29 DIAGNOSIS — K219 Gastro-esophageal reflux disease without esophagitis: Secondary | ICD-10-CM

## 2020-02-29 DIAGNOSIS — G8929 Other chronic pain: Secondary | ICD-10-CM

## 2020-02-29 DIAGNOSIS — G500 Trigeminal neuralgia: Secondary | ICD-10-CM | POA: Diagnosis not present

## 2020-02-29 DIAGNOSIS — W57XXXA Bitten or stung by nonvenomous insect and other nonvenomous arthropods, initial encounter: Secondary | ICD-10-CM

## 2020-02-29 DIAGNOSIS — M792 Neuralgia and neuritis, unspecified: Secondary | ICD-10-CM

## 2020-02-29 DIAGNOSIS — F331 Major depressive disorder, recurrent, moderate: Secondary | ICD-10-CM

## 2020-02-29 DIAGNOSIS — C9110 Chronic lymphocytic leukemia of B-cell type not having achieved remission: Secondary | ICD-10-CM | POA: Diagnosis not present

## 2020-02-29 DIAGNOSIS — E559 Vitamin D deficiency, unspecified: Secondary | ICD-10-CM | POA: Diagnosis not present

## 2020-02-29 MED ORDER — ATORVASTATIN CALCIUM 10 MG PO TABS: 10.0000 mg | ORAL_TABLET | Freq: Every day | ORAL | 1 refills | Status: DC

## 2020-02-29 MED ORDER — TRIAMCINOLONE ACETONIDE 0.1 % EX CREA: 1.0000 "application " | TOPICAL_CREAM | Freq: Two times a day (BID) | CUTANEOUS | 0 refills | Status: DC

## 2020-02-29 MED ORDER — OMEPRAZOLE 20 MG PO CPDR: 20.0000 mg | DELAYED_RELEASE_CAPSULE | Freq: Every day | ORAL | 1 refills | Status: DC

## 2020-02-29 MED ORDER — SERTRALINE HCL 100 MG PO TABS: 150.0000 mg | ORAL_TABLET | Freq: Every day | ORAL | 0 refills | Status: DC

## 2020-02-29 MED ORDER — ALBUTEROL SULFATE HFA 108 (90 BASE) MCG/ACT IN AERS: 2.0000 | INHALATION_SPRAY | RESPIRATORY_TRACT | 0 refills | Status: DC | PRN

## 2020-02-29 MED ORDER — BREZTRI AEROSPHERE 160-9-4.8 MCG/ACT IN AERO: 2.0000 | INHALATION_SPRAY | Freq: Two times a day (BID) | RESPIRATORY_TRACT | 5 refills | Status: DC
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
## 2020-03-02 ENCOUNTER — Other Ambulatory Visit: Payer: Self-pay | Admitting: Family Medicine

## 2020-03-02 ENCOUNTER — Telehealth: Payer: Self-pay

## 2020-03-02 DIAGNOSIS — G8929 Other chronic pain: Secondary | ICD-10-CM

## 2020-03-02 DIAGNOSIS — J432 Centrilobular emphysema: Secondary | ICD-10-CM

## 2020-03-02 DIAGNOSIS — C9110 Chronic lymphocytic leukemia of B-cell type not having achieved remission: Secondary | ICD-10-CM

## 2020-03-02 DIAGNOSIS — M792 Neuralgia and neuritis, unspecified: Secondary | ICD-10-CM

## 2020-03-10 NOTE — Telephone Encounter (Signed)
Pt contacted and aware referral was placed.

## 2020-03-15 ENCOUNTER — Ambulatory Visit: Payer: Medicaid Other | Admitting: Physical Therapy

## 2020-03-15 ENCOUNTER — Telehealth: Payer: Self-pay | Admitting: Family Medicine

## 2020-03-15 NOTE — Telephone Encounter (Signed)
[employer], from cover my meds, called stating that the pt is needing a PA for the medication [employer]. Please advise.     Callback # [id number]

## 2020-03-16 DIAGNOSIS — Z419 Encounter for procedure for purposes other than remedying health state, unspecified: Secondary | ICD-10-CM | POA: Diagnosis not present

## 2020-03-21 NOTE — Telephone Encounter (Signed)
Forms for PA was printed off from [facility] and placed in your green folder to complete and sign.

## 2020-03-22 ENCOUNTER — Ambulatory Visit: Payer: Medicaid Other | Admitting: Physical Therapy

## 2020-03-27 NOTE — Telephone Encounter (Signed)
Prior Authorization was initiated for [employer] and Approved. The medication is approved through 03/22/2020- until further notice.

## 2020-03-29 ENCOUNTER — Encounter: Payer: Medicaid Other | Admitting: Physical Therapy

## 2020-03-29 ENCOUNTER — Telehealth: Payer: Self-pay | Admitting: Pharmacy Technician

## 2020-03-29 MED FILL — IMBRUVICA 420 MG TAB: 420 | 28 days supply | Qty: 28 | Fill #2

## 2020-03-29 NOTE — Telephone Encounter (Signed)
Oral Oncology Patient Advocate Encounter  Received notification from [facility] that prior authorization for Imbruvica is required.  PA submitted on CoverMyMeds Key [id number] Status is pending  Oral Oncology Clinic will continue to follow.  [place] Patient [place] Phone [phone] Fax [phone] 03/29/2020 12:58 PM

## 2020-03-29 NOTE — Telephone Encounter (Signed)
Oral Oncology Patient Advocate Encounter  Prior Authorization for Monique [name] has been approved.    PA# [id number] Effective dates: 03/27/20 - until further notice  Patients co-pay is $0.00  Oral Oncology Clinic will continue to follow.   [place] Patient [place] Phone [phone] Fax [phone] 03/29/2020 12:58 PM

## 2020-04-06 ENCOUNTER — Encounter: Payer: Medicaid Other | Admitting: Physical Therapy

## 2020-04-07 ENCOUNTER — Other Ambulatory Visit: Payer: Self-pay

## 2020-04-07 ENCOUNTER — Encounter: Payer: Self-pay | Admitting: Physical Therapy

## 2020-04-07 ENCOUNTER — Other Ambulatory Visit: Payer: Self-pay | Admitting: Family Medicine

## 2020-04-07 ENCOUNTER — Ambulatory Visit: Payer: Medicaid Other | Attending: Family Medicine | Admitting: Physical Therapy

## 2020-04-07 DIAGNOSIS — M79605 Pain in left leg: Secondary | ICD-10-CM | POA: Diagnosis not present

## 2020-04-07 DIAGNOSIS — G8929 Other chronic pain: Secondary | ICD-10-CM | POA: Diagnosis not present

## 2020-04-07 DIAGNOSIS — M6281 Muscle weakness (generalized): Secondary | ICD-10-CM | POA: Diagnosis not present

## 2020-04-07 DIAGNOSIS — R269 Unspecified abnormalities of gait and mobility: Secondary | ICD-10-CM | POA: Insufficient documentation

## 2020-04-07 DIAGNOSIS — J438 Other emphysema: Secondary | ICD-10-CM

## 2020-04-07 DIAGNOSIS — M79604 Pain in right leg: Secondary | ICD-10-CM | POA: Insufficient documentation

## 2020-04-07 DIAGNOSIS — M792 Neuralgia and neuritis, unspecified: Secondary | ICD-10-CM | POA: Diagnosis not present

## 2020-04-09 NOTE — Therapy (Signed)
[facility] [address] [place], [place], [address] Phone: [phone]   Fax:  [phone]  Physical Therapy Evaluation  Patient Details  Name: Krystelle Prashad MRN: [id number] Date of Birth: 08/01/1971 Referring Provider (PT): Dr. [name]   Encounter Date: 04/07/2020   PT End of Session - 04/09/20 1624    Visit Number 1    Number of Visits 1    Date for PT Re-Evaluation 04/08/20    PT Start Time 0806    PT Stop Time 0939    PT Time Calculation (min) 93 min    Activity Tolerance Patient tolerated treatment well;Patient limited by fatigue;Patient limited by pain    Behavior During Therapy [facility] for tasks assessed/performed           Past Medical History:  Diagnosis Date  . Anxiety   . Bursitis    leg pain  . Carpal tunnel syndrome   . Cervical cancer ([place])    hx LEEP over 18 years ago.   . Cervical intraepithelial neoplasia I   . Chronic lymphocytic leukemia ([place]) 2018   Dr Grayland Ormond  . Depression   . Eating disorder   . History of self-harm   . Insomnia   . Obsession   . Pap smear abnormality of cervix with LGSIL   . Tobacco abuse   . Vitamin B12 deficiency (non anemic)     Past Surgical History:  Procedure Laterality Date  . BREAST BIOPSY Right 01/05/2018   US guided biopsy of 2 areas and 1 lymph node, MIXED INFLAMMATION AND GIANT CELL REACTION  . CERVICAL BIOPSY  W/ LOOP ELECTRODE EXCISION    . COLONOSCOPY WITH PROPOFOL N/A 04/21/2019   Procedure: COLONOSCOPY WITH PROPOFOL;  Surgeon: Virgel Manifold, MD;  Location: [facility] ENDOSCOPY;  Service: Gastroenterology;  Laterality: N/A;  . OTHER SURGICAL HISTORY     scar tissue removed from vocal cords  . TUBAL LIGATION      There were no vitals filed for this visit.        [facility] PT Assessment - 04/09/20 0001      Assessment   Medical Diagnosis Centrilobular emphysema/ Chronic neuropathic pain/ Chronic lymphocytic leukemia      Referring Provider  (PT) Dr. [name]    Onset Date/Surgical Date 09/16/25      Prior Function   Level of Independence Independent      Cognition   Overall Cognitive Status Within Functional Limits for tasks assessed                     Plan - 04/09/20 1625    Clinical Impression Statement Overall Level of Work: Falls within the Sedentary range.  Exerting up to 10 pounds of force occasionally (Occasionally: activity or condition exist up to 1/3 or the time) and/or a negligible amount of force frequently (Frequently: activity or condition exist from 1/3 to 2/3 or the time) to lift, carry, push, pull, or otherwise move objects, including the human body.  Sedentary work involves sitting most of the time, but may involve walking or standing for brief periods of time.  Jobs are sedentary if walking and standing are required only occasionally and all other sedentary criteria are met.    Please see the Task Performance Table for specific abilities.  Tolerance for the 8-Hour Day: Due to the client's limited ability to sit, she would have to alternate between sitting and other tasks as listed in the task performance  table to tolerate the Sedentary level of work for the 8-hour day/40-hour week.  Due to the limited sitting tolerance, however, the client will need to alternate among other tasks listed in the task performance table to maximize work tolerance to the 8-hour day.    Stability/Clinical Decision Making Evolving/Moderate complexity    Clinical Decision Making Moderate    Rehab Potential Fair    PT Frequency One time visit    PT Treatment/Interventions ADLs/Self Care Home Management;Stair training;Functional mobility training;Therapeutic activities;Therapeutic exercise;Patient/family education;Neuromuscular re-education;Balance training    PT Next Visit Plan FCE only           Patient will benefit from skilled therapeutic intervention in order to improve the following deficits and impairments:  Pain,  Impaired UE functional use, Decreased activity tolerance, Difficulty walking, Decreased endurance  Visit Diagnosis: Bilateral leg pain  Chronic neuropathic pain  Muscle weakness (generalized)  Gait difficulty     Problem List Patient Active Problem List   Diagnosis Date Noted  . 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 Bilateral leg pain 11/03/2019  . Iron deficiency anemia 10/15/2019  . Bilateral arm pain 08/05/2019  . Bilateral hand numbness 08/05/2019  . Weakness of both hands 08/05/2019  . Chronic musculoskeletal pain 07/19/2019  . Chronic neuropathic pain 07/19/2019  . 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 Special screening for malignant neoplasms, colon   . Polyp of sigmoid colon   . Hemorrhoids   . Trigeminal neuralgia 10/05/2018  . Lumbar radiculitis (Right) 09/24/2018  . Hypocalcemia 06/03/2018  . Vitamin D insufficiency 06/03/2018  . Abnormal MRI, lumbar spine (04/20/2017) 06/03/2018  . Chronic hip pain (Right) 06/03/2018  . Spondylosis without myelopathy or radiculopathy, lumbar region 06/03/2018  . DDD (degenerative disc disease), lumbar 06/02/2018  . Lumbar facet hypertrophy 06/02/2018  . Lumbar facet arthropathy 06/02/2018  . Lumbar facet syndrome (Bilateral) (R>L) 06/02/2018  . Marijuana use 05/19/2018  . Chronic lower extremity pain (Primary Area of Pain) (Bilateral) (R>L) 05/13/2018  . Chronic low back pain (Secondary Area of Pain) (Bilateral) (R>L) w/ sciatica (Bilateral) 05/13/2018  . Chronic pain syndrome 05/13/2018  . Opiate use 05/13/2018  . Disorder of skeletal system 05/13/2018  . Pharmacologic therapy 05/13/2018  . Problems influencing health status 05/13/2018  . Mastalgia 05/05/2018  . Breast mass, right 01/07/2018  . Face pain 12/11/2017  . Tingling 12/11/2017  . Thoracic aortic atherosclerosis ([place]) 08/20/2017  . Emphysema of lung ([place]) 08/20/2017  . Adductor tendinitis 04/23/2017  . Trochanteric bursitis of right hip  04/23/2017  . GERD without esophagitis 12/11/2016  . CLL (chronic lymphocytic leukemia) ([place]) 11/24/2016  . Pap smear abnormality of cervix/human papillomavirus (HPV) positive 09/12/2016  . Stress incontinence 09/04/2016  . B12 deficiency 07/10/2015  . Insomnia, persistent 07/10/2015  . Anorexia nervosa, restricting type 07/10/2015  . Anxiety, generalized 07/10/2015  . H/O suicide attempt 07/10/2015  . Lymphocytosis 07/10/2015  . Obsessive-compulsive disorder 07/10/2015  . Tobacco use 07/10/2015  . History of cervical dysplasia 07/18/2014   Pura Spice, PT, DPT # [phone] 04/09/2020, 4:28 PM  [facility] [address] [place], [place], [address] Phone: [phone]   Fax:  [phone]  Name: Berdina Cheever MRN: [id number] Date of Birth: 05/03/1971

## 2020-04-12 ENCOUNTER — Encounter: Payer: Medicaid Other | Admitting: Physical Therapy

## 2020-04-16 DIAGNOSIS — Z419 Encounter for procedure for purposes other than remedying health state, unspecified: Secondary | ICD-10-CM | POA: Diagnosis not present

## 2020-04-19 ENCOUNTER — Encounter: Payer: Medicaid Other | Admitting: Physical Therapy

## 2020-04-26 ENCOUNTER — Encounter: Payer: Medicaid Other | Admitting: Physical Therapy

## 2020-04-28 ENCOUNTER — Other Ambulatory Visit: Payer: Self-pay | Admitting: Oncology

## 2020-04-28 DIAGNOSIS — C9110 Chronic lymphocytic leukemia of B-cell type not having achieved remission: Secondary | ICD-10-CM

## 2020-04-28 MED FILL — IMBRUVICA 420 MG TAB: 420 | 28 days supply | Qty: 28 | Fill #0

## 2020-04-28 NOTE — Progress Notes (Deleted)
Monique [name]  [phone] [phone]  ID: Monique [name] OB: [birth year]  MR#: [id number]  LPN#:[id number]  Patient Care Team: [name], MD as PCP - General (Family Medicine) Lloyd Huger, MD as Consulting Physician (Oncology)  CHIEF COMPLAINT: CLL, iron deficiency.  INTERVAL HISTORY: Patient returns to clinic today for repeat laboratory work and routine 11-monthevaluation.  She continues to have neuropathy pain of unclear etiology that is actively being evaluated and treatment by neurology.  She otherwise feels well.  She continues to tolerate Imbruvica without significant side effects. She has no other neurologic complaints.  She denies any fevers or night sweats.  She has a good appetite and denies weight loss.  She has no chest pain, shortness of breath, cough, or hemoptysis.  She denies any nausea, vomiting, constipation, or diarrhea.  She has no melena or hematochezia.  She has no urinary complaints.  Patient offers no further specific complaints today.  REVIEW OF SYSTEMS:   Review of Systems  Constitutional: Negative for diaphoresis, fever, malaise/fatigue and weight loss.  HENT: Negative.  Negative for congestion.   Respiratory: Negative.  Negative for cough and shortness of breath.   Cardiovascular: Negative.  Negative for chest pain and leg swelling.  Gastrointestinal: Negative for abdominal pain and constipation.  Genitourinary: Negative.  Negative for dysuria and frequency.  Musculoskeletal: Negative.  Negative for back pain, myalgias and neck pain.  Skin: Negative.   Neurological: Positive for sensory change. Negative for tingling, focal weakness and weakness.  Psychiatric/Behavioral: Negative.  Negative for depression and suicidal ideas. The patient is not nervous/anxious.     As per HPI. Otherwise, a complete review of systems is negative.  PAST MEDICAL HISTORY: Past Medical History:  Diagnosis Date  . Anxiety   .  Bursitis    leg pain  . Carpal tunnel syndrome   . Cervical cancer ([facility]    hx LEEP over 18 years ago.   . Cervical intraepithelial neoplasia I   . Chronic lymphocytic leukemia ([place] 2018   Dr FGrayland Ormond . Depression   . Eating disorder   . History of self-harm   . Insomnia   . Obsession   . Pap smear abnormality of cervix with LGSIL   . Tobacco abuse   . Vitamin B12 deficiency (non anemic)     PAST SURGICAL HISTORY: Past Surgical History:  Procedure Laterality Date  . BREAST BIOPSY Right 01/05/2018   [place] of 2 areas and 1 lymph node, MIXED INFLAMMATION AND GIANT CELL REACTION  . CERVICAL BIOPSY  W/ LOOP ELECTRODE EXCISION    . COLONOSCOPY WITH PROPOFOL N/A 04/21/2019   Procedure: COLONOSCOPY WITH PROPOFOL;  Surgeon: TVirgel Manifold MD;  Location: [facility] ENDOSCOPY;  Service: Gastroenterology;  Laterality: N/A;  . OTHER SURGICAL HISTORY     scar tissue removed from vocal cords  . TUBAL LIGATION      FAMILY HISTORY: Family History  Problem Relation Age of Onset  . Cancer Mother        thyroid  . Alcohol abuse Father   . Alcohol abuse Brother   . Depression Brother   . Bipolar disorder Brother   . ADD / ADHD Son   . Breast cancer Neg Hx     ADVANCED DIRECTIVES (Y/N):  N  HEALTH MAINTENANCE: Social History   Tobacco Use  . Smoking status: Current Every Day Smoker    Packs/day: 0.50    Years: 31.00    Pack years: 15.50  Types: Cigarettes    Start date: 09/25/1986  . Smokeless tobacco: Never Used  Vaping Use  . Vaping Use: Former  Substance Use Topics  . Alcohol use: Not Currently    Alcohol/week: 0.0 standard drinks    Comment: rarely  . Drug use: Not Currently    Types: Marijuana     Colonoscopy:  PAP:  Bone density:  Lipid panel:  No Known Allergies  Current Outpatient Medications  Medication Sig Dispense Refill  . albuterol (VENTOLIN HFA) 108 (90 Base) MCG/ACT inhaler Inhale 2 puffs into the lungs every 4 (four) hours as needed  for wheezing or shortness of breath. 8.5 g 0  . atorvastatin (LIPITOR) 10 MG tablet Take 1 tablet (10 mg total) by mouth daily. 90 tablet 1  . Budeson-Glycopyrrol-Formoterol (BREZTRI AEROSPHERE) 160-9-4.8 MCG/ACT AERO Inhale 2 puffs into the lungs in the morning and at bedtime. 10.7 g 5  . Calcium Carbonate-Vit D-Min (GNP CALCIUM 1200) 1200-1000 MG-UNIT CHEW Chew 1,200 mg by mouth daily with breakfast. Take in combination with vitamin D and magnesium. 90 tablet 3  . Cyanocobalamin (VITAMELTS ENERGY VITAMIN B-12) 1500 MCG TBDP Take 1 tablet by mouth daily. 90 tablet 0  . DULoxetine (CYMBALTA) 20 MG capsule Take 1 capsule by mouth 3 (three) times daily.    Marland Kitchen gabapentin (NEURONTIN) 400 MG capsule Take 1 capsule (400 mg total) by mouth 3 (three) times daily. Follow titration schedule. 90 capsule 2  . IMBRUVICA 420 MG TABS TAKE 1 TABLET BY MOUTH DAILY. 28 tablet 2  . lidocaine-prilocaine (EMLA) cream Apply 1 application topically as needed. 30 g 1  . loratadine (CLARITIN) 10 MG tablet TAKE 1 TABLET BY MOUTH ONCE DAILY IN THE MORNING 90 tablet 1  . Magnesium 500 MG TABS Take by mouth daily.    . meloxicam (MOBIC) 15 MG tablet Take 1 tablet (15 mg total) by mouth daily. 90 tablet 1  . omeprazole (PRILOSEC) 20 MG capsule Take 1 capsule (20 mg total) by mouth daily. 90 capsule 1  . sertraline (ZOLOFT) 100 MG tablet Take 1.5 tablets (150 mg total) by mouth daily. 135 tablet 0  . SPIRIVA HANDIHALER 18 MCG inhalation capsule INHALE 1 CAPSULE VIA HANDIHALER ONCE DAILY AT THE SAME TIME EVERY DAY 30 capsule 3  . tiZANidine (ZANAFLEX) 2 MG tablet Take 1 tablet (2 mg total) by mouth every 8 (eight) hours as needed for muscle spasms. 90 tablet 5  . traZODone (DESYREL) 50 MG tablet Take 75 mg by mouth at bedtime as needed.     . triamcinolone cream (KENALOG) 0.1 % Apply 1 application topically 2 (two) times daily. 30 g 0   No current facility-administered medications for this visit.    OBJECTIVE: There were no  vitals filed for this visit.   There is no height or weight on file to calculate BMI.    ECOG FS:0 - Asymptomatic  General: Well-developed, well-nourished, no acute distress. Eyes: Pink conjunctiva, anicteric sclera. HEENT: Normocephalic, moist mucous membranes. Lungs: No audible wheezing or coughing. Heart: Regular rate and rhythm. Abdomen: Soft, nontender, no obvious distention. Musculoskeletal: No edema, cyanosis, or clubbing. Neuro: Alert, answering all questions appropriately. Cranial nerves grossly intact. Skin: No rashes or petechiae noted. Psych: Normal affect.   LAB RESULTS:  Lab Results  Component Value Date   NA 137 01/27/2020   K 4.2 01/27/2020   CL 103 01/27/2020   CO2 27 01/27/2020   GLUCOSE 86 01/27/2020   BUN 17 01/27/2020   CREATININE 0.81 01/27/2020  CALCIUM 8.5 (L) 01/27/2020   PROT 6.7 01/27/2020   ALBUMIN 4.0 01/27/2020   AST 18 01/27/2020   ALT 14 01/27/2020   ALKPHOS 56 01/27/2020   BILITOT 0.6 01/27/2020   GFRNONAA >60 01/27/2020   GFRAA >60 01/27/2020    Lab Results  Component Value Date   WBC 12.0 (H) 01/27/2020   NEUTROABS 4.0 01/27/2020   HGB 15.7 (H) 01/27/2020   HCT 46.6 (H) 01/27/2020   MCV 95.3 01/27/2020   PLT 177 01/27/2020     STUDIES: No results found.  ASSESSMENT: CLL,  iron deficiency.  PLAN:    1. CLL: Confirmed by peripheral blood flow cytometry.  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  CT scan results from April 30, 2019 reviewed independently with continued improvement of patient's known lymphadenopathy.  Patient completed cycle 3 of Rituxan plus Treanda on March 26, 2018, but was noted to have progressive disease therefore initiated 480 mg Imbruvica daily.  Continue current treatment indefinitely until progression of disease or intolerable side effects.  No further intervention is needed at this time.  Return to clinic in 3 months with  repeat laboratory work and further evaluation.  Will also repeat CT scan of chest, abdomen, and pelvis at that time for yearly update.   2.  Leukocytosis: White blood cell count has trended up slightly to 12.0.  Continue Imbruvica as above. 3.  Trigeminal neuralgia: Patient does not complain of this today.  Appears to be unrelated to CLL or Imbruvica.  MRI of the brain on Jan 25, 2019 was unremarkable.   4.  Depression/anxiety: Chronic.  Continue follow-up and treatment per primary care. 5.  Neuropathic pain: Continue gabapentin and follow-up with neurology as indicated. 6.  Iron deficiency: Resolved.  Patient received 1 dose of IV Feraheme on October 21, 2019.  Patient expressed understanding and was in agreement with this plan. She also understands that She can call clinic at any time with any questions, concerns, or complaints.    Lloyd Huger, MD   04/28/2020 8:26 AM

## 2020-05-02 ENCOUNTER — Ambulatory Visit: Admission: RE | Admit: 2020-05-02 | Payer: Medicaid Other | Source: Ambulatory Visit

## 2020-05-02 ENCOUNTER — Other Ambulatory Visit: Payer: Self-pay | Admitting: Neurology

## 2020-05-02 DIAGNOSIS — G8929 Other chronic pain: Secondary | ICD-10-CM

## 2020-05-02 DIAGNOSIS — M545 Low back pain, unspecified: Secondary | ICD-10-CM

## 2020-05-04 ENCOUNTER — Inpatient Hospital Stay: Payer: Medicaid Other | Admitting: Oncology

## 2020-05-04 ENCOUNTER — Other Ambulatory Visit: Payer: Self-pay | Admitting: Nurse Practitioner

## 2020-05-04 ENCOUNTER — Inpatient Hospital Stay: Payer: Medicaid Other

## 2020-05-04 DIAGNOSIS — E559 Vitamin D deficiency, unspecified: Secondary | ICD-10-CM

## 2020-05-07 ENCOUNTER — Other Ambulatory Visit: Payer: Self-pay | Admitting: Nurse Practitioner

## 2020-05-07 DIAGNOSIS — E559 Vitamin D deficiency, unspecified: Secondary | ICD-10-CM

## 2020-05-08 NOTE — Telephone Encounter (Signed)
Can she receive a refill?

## 2020-05-17 ENCOUNTER — Ambulatory Visit: Payer: Medicaid Other

## 2020-05-17 DIAGNOSIS — Z419 Encounter for procedure for purposes other than remedying health state, unspecified: Secondary | ICD-10-CM | POA: Diagnosis not present

## 2020-05-23 ENCOUNTER — Other Ambulatory Visit: Payer: Self-pay | Admitting: Family Medicine

## 2020-05-23 DIAGNOSIS — F4310 Post-traumatic stress disorder, unspecified: Secondary | ICD-10-CM

## 2020-05-23 DIAGNOSIS — F331 Major depressive disorder, recurrent, moderate: Secondary | ICD-10-CM

## 2020-05-25 MED FILL — IMBRUVICA 420 MG TAB: 420 | 28 days supply | Qty: 28 | Fill #1

## 2020-05-30 ENCOUNTER — Ambulatory Visit: Admission: RE | Admit: 2020-05-30 | Payer: Medicaid Other | Source: Ambulatory Visit

## 2020-05-30 ENCOUNTER — Telehealth: Payer: Self-pay

## 2020-05-30 NOTE — Telephone Encounter (Signed)
Patient called and left message with triage nurse that she had recently been exposed to [place]. She wanted to know if she still needed to go to her CT that was scheduled this morning.   Consulted with provider, he recommended for her to get tested since she has had exposure and to reschedule CT at this time.  Informed patient of provider's recommendations. She informed me that she had also tested positive also. She stated that since she had tested positive last month, she was told she could still test positive for COVID at least 3 months after exposure. Patient wanted just reschedule appointments at this time. She states there is no need for her to get tested if she is going to test positive.

## 2020-06-02 ENCOUNTER — Other Ambulatory Visit: Payer: Self-pay

## 2020-06-02 ENCOUNTER — Ambulatory Visit
Admission: RE | Admit: 2020-06-02 | Discharge: 2020-06-02 | Disposition: A | Discharge status: Home / Self Care | Payer: Medicaid Other | Source: Ambulatory Visit | Attending: Neurology | Admitting: Neurology

## 2020-06-02 DIAGNOSIS — M47816 Spondylosis without myelopathy or radiculopathy, lumbar region: Secondary | ICD-10-CM | POA: Diagnosis not present

## 2020-06-02 DIAGNOSIS — M5126 Other intervertebral disc displacement, lumbar region: Secondary | ICD-10-CM | POA: Diagnosis not present

## 2020-06-02 DIAGNOSIS — M25551 Pain in right hip: Secondary | ICD-10-CM | POA: Diagnosis not present

## 2020-06-02 DIAGNOSIS — M25552 Pain in left hip: Secondary | ICD-10-CM | POA: Diagnosis not present

## 2020-06-02 DIAGNOSIS — G8929 Other chronic pain: Secondary | ICD-10-CM | POA: Insufficient documentation

## 2020-06-02 DIAGNOSIS — M545 Low back pain, unspecified: Secondary | ICD-10-CM

## 2020-06-05 ENCOUNTER — Telehealth: Payer: Self-pay | Admitting: *Deleted

## 2020-06-05 DIAGNOSIS — M25551 Pain in right hip: Secondary | ICD-10-CM | POA: Diagnosis not present

## 2020-06-05 DIAGNOSIS — R519 Headache, unspecified: Secondary | ICD-10-CM | POA: Diagnosis not present

## 2020-06-05 DIAGNOSIS — M79604 Pain in right leg: Secondary | ICD-10-CM | POA: Diagnosis not present

## 2020-06-05 DIAGNOSIS — R202 Paresthesia of skin: Secondary | ICD-10-CM | POA: Diagnosis not present

## 2020-06-05 NOTE — Telephone Encounter (Signed)
Returned patient call re; LESI.  She  Has had these in the past for periperhal neuropathy and neurologist has suggested she have another to see if it will help pain this time, as the last one did not give much benefit.  Explained to patient that Dr Dossie Arbour is out of the office for next 2 weeks but we could potentially get her into see Dr Holley Raring.  Patient states she will wait for Dr Dossie Arbour to return.  Sent up to scheduling for appt with Dr Dossie Arbour to discuss scheduling LESI.

## 2020-06-07 ENCOUNTER — Ambulatory Visit: Payer: Medicaid Other | Admitting: Oncology

## 2020-06-07 ENCOUNTER — Other Ambulatory Visit: Payer: Medicaid Other

## 2020-06-07 DIAGNOSIS — M25851 Other specified joint disorders, right hip: Secondary | ICD-10-CM | POA: Diagnosis not present

## 2020-06-07 DIAGNOSIS — M25551 Pain in right hip: Secondary | ICD-10-CM | POA: Diagnosis not present

## 2020-06-09 ENCOUNTER — Ambulatory Visit: Payer: Medicaid Other | Admitting: Oncology

## 2020-06-09 NOTE — Progress Notes (Signed)
[place]  [phone]) [phone] Fax:(336) [phone]  ID: Monique [name] OB: 09/12/1971  MR#: [id number]  MCR#:[id number]  Patient Care Team: Monique Sizer, MD as PCP - General (Family Medicine) Monique Huger, MD as Consulting Physician (Oncology)  I connected with Monique [name] on 06/16/20 at  2:45 PM EDT by video enabled telemedicine visit and verified that I am speaking with the correct person using two identifiers.   I discussed the limitations, risks, security and privacy concerns of performing an evaluation and management service by telemedicine and the availability of in-person appointments. I also discussed with the patient that there may be a patient responsible charge related to this service. The patient expressed understanding and agreed to proceed.   Other persons participating in the visit and their role in the encounter: Patient, MD.  Patient's location: Home. Provider's location: Clinic.  CHIEF COMPLAINT: CLL, iron deficiency.  INTERVAL HISTORY: Patient agreed to video assisted telemedicine visit for routine 25-monthevaluation and discussion of her imaging and laboratory results.  She continues to have a peripheral neuropathy and other neurologic complaints that are actively being evaluated and treated by neurology.  She otherwise feels well.  She continues to tolerate Imbruvica without significant side effects.  She denies any fevers or night sweats.  She has a good appetite and denies weight loss.  She has no chest pain, shortness of breath, cough, or hemoptysis.  She denies any nausea, vomiting, constipation, or diarrhea.  She has no melena or hematochezia.  She has no urinary complaints.  Patient offers no further specific complaints today.  REVIEW OF SYSTEMS:   Review of Systems  Constitutional: Negative for diaphoresis, fever, malaise/fatigue and weight loss.  HENT: Negative.  Negative for congestion.   Respiratory: Negative.   Negative for cough and shortness of breath.   Cardiovascular: Negative.  Negative for chest pain and leg swelling.  Gastrointestinal: Negative for abdominal pain and constipation.  Genitourinary: Negative.  Negative for dysuria and frequency.  Musculoskeletal: Negative.  Negative for back pain, myalgias and neck pain.  Skin: Negative.   Neurological: Positive for sensory change. Negative for tingling, focal weakness and weakness.  Psychiatric/Behavioral: Negative.  Negative for depression and suicidal ideas. The patient is not nervous/anxious.     As per HPI. Otherwise, a complete review of systems is negative.  PAST MEDICAL HISTORY: Past Medical History:  Diagnosis Date  . Anxiety   . Bursitis    leg pain  . Carpal tunnel syndrome   . [place]ervical cancer [place]    hx LEEP over 18 years ago.   . Cervical intraepithelial neoplasia I   . [place]   Dr [name] . Depression   . Eating disorder   . History of self-harm   . Insomnia   . Obsession   . Pap smear abnormality of cervix with LGSIL   . Tobacco abuse   . Vitamin B12 deficiency (non anemic)     PAST SURGICAL HISTORY: Past Surgical History:  Procedure Laterality Date  . BREAST BIOPSY Right 01/05/2018   [place] of 2 areas and 1 lymph node, MIXED INFLAMMATION AND GIANT CELL REACTION  . CERVICAL BIOPSY  W/ LOOP ELECTRODE EXCISION    . COLONOSCOPY WITH PROPOFOL N/A 04/21/2019   Procedure: COLONOSCOPY WITH PROPOFOL;  Surgeon: Monique Manifold MD;  Location: [facility] ENDOSCOPY;  Service: Gastroenterology;  Laterality: N/A;  . OTHER SURGICAL HISTORY     scar tissue removed from vocal cords  .  TUBAL LIGATION      FAMILY HISTORY: Family History  Problem Relation Age of Onset  . Cancer Mother        thyroid  . Alcohol abuse Father   . Alcohol abuse Brother   . Depression Brother   . Bipolar disorder Brother   . ADD / ADHD Son   . Breast cancer Neg Hx     ADVANCED DIRECTIVES  (Y/N):  N  HEALTH MAINTENANCE: Social History   Tobacco Use  . Smoking status: Current Every Day Smoker    Packs/day: 0.50    Years: 31.00    Pack years: 15.50    Types: Cigarettes    Start date: 09/25/1986  . Smokeless tobacco: Never Used  Vaping Use  . Vaping Use: Former  Substance Use Topics  . Alcohol use: Not Currently    Alcohol/week: 0.0 standard drinks    Comment: rarely  . Drug use: Not Currently    Types: Marijuana     Colonoscopy:  PAP:  Bone density:  Lipid panel:  No Known Allergies  Current Outpatient Medications  Medication Sig Dispense Refill  . albuterol (VENTOLIN HFA) 108 (90 Base) MCG/ACT inhaler Inhale 2 puffs into the lungs every 4 (four) hours as needed for wheezing or shortness of breath. 8.5 g 0  . atorvastatin (LIPITOR) 10 MG tablet Take 1 tablet (10 mg total) by mouth daily. 90 tablet 1  . Budeson-Glycopyrrol-Formoterol (BREZTRI AEROSPHERE) 160-9-4.8 MCG/ACT AERO Inhale 2 puffs into the lungs in the morning and at bedtime. 10.7 g 5  . Calcium Carbonate-Vit D-Min (GNP CALCIUM 1200) 1200-1000 MG-UNIT CHEW Chew 1,200 mg by mouth daily with breakfast. Take in combination with vitamin D and magnesium. 90 tablet 3  . DULoxetine (CYMBALTA) 20 MG capsule Take 1 capsule by mouth 3 (three) times daily.    . IMBRUVICA 420 MG TABS TAKE 1 TABLET BY MOUTH DAILY. 28 tablet 2  . lidocaine-prilocaine (EMLA) cream Apply 1 application topically as needed. 30 g 1  . loratadine (CLARITIN) 10 MG tablet TAKE 1 TABLET BY MOUTH ONCE DAILY IN THE MORNING 90 tablet 1  . Magnesium 500 MG TABS Take by mouth daily.    . meloxicam (MOBIC) 15 MG tablet Take 1 tablet (15 mg total) by mouth daily. 90 tablet 1  . omeprazole (PRILOSEC) 20 MG capsule Take 1 capsule (20 mg total) by mouth daily. 90 capsule 1  . pregabalin (LYRICA) 100 MG capsule Take 100 mg by mouth 3 (three) times daily.    . sertraline (ZOLOFT) 100 MG tablet TAKE 1 AND 1/2 TABLETS BY MOUTH DAILY 135 tablet 0  .  SPIRIVA HANDIHALER 18 MCG inhalation capsule INHALE 1 CAPSULE VIA HANDIHALER ONCE DAILY AT THE SAME TIME EVERY DAY 30 capsule 3  . tiZANidine (ZANAFLEX) 2 MG tablet Take 1 tablet (2 mg total) by mouth every 8 (eight) hours as needed for muscle spasms. 90 tablet 5  . traZODone (DESYREL) 50 MG tablet Take 75 mg by mouth at bedtime as needed.     . triamcinolone cream (KENALOG) 0.1 % Apply 1 application topically 2 (two) times daily. 30 g 0   No current facility-administered medications for this visit.    OBJECTIVE: There were no vitals filed for this visit.   There is no height or weight on file to calculate BMI.    ECOG FS:0 - Asymptomatic  General: Well-developed, well-nourished, no acute distress. HEENT: Normocephalic. Neuro: Alert, answering all questions appropriately. Cranial nerves grossly intact. Psych: Normal affect.  LAB RESULTS:  Lab Results  Component Value Date   NA 139 06/13/2020   K 4.1 06/13/2020   CL 104 06/13/2020   CO2 27 06/13/2020   GLUCOSE 72 06/13/2020   BUN 22 (H) 06/13/2020   CREATININE 0.81 06/13/2020   CALCIUM 8.5 (L) 06/13/2020   PROT 6.5 06/13/2020   ALBUMIN 3.9 06/13/2020   AST 23 06/13/2020   ALT 19 06/13/2020   ALKPHOS 61 06/13/2020   BILITOT 0.6 06/13/2020   GFRNONAA >60 06/13/2020   GFRAA >60 06/13/2020    Lab Results  Component Value Date   WBC 10.2 06/13/2020   NEUTROABS 3.3 06/13/2020   HGB 14.0 06/13/2020   HCT 41.1 06/13/2020   MCV 95.1 06/13/2020   PLT 166 06/13/2020     STUDIES: CT SOFT TISSUE NECK W CONTRAST  Result Date: 06/13/2020 CLINICAL DATA:  CLL (chronic lymphocytic leukemia).  Restaging CLL. EXAM: CT NECK WITH CONTRAST TECHNIQUE: Multidetector CT imaging of the neck was performed using the standard protocol following the bolus administration of intravenous contrast. CONTRAST:  18m OMNIPAQUE IOHEXOL 300 MG/ML  SOLN COMPARISON:  Neck CT 04/30/2019. FINDINGS: Pharynx and larynx: No appreciable swelling or discrete  mass within the oral cavity, pharynx or larynx. Salivary glands: No inflammation, mass, or stone. Thyroid: Unremarkable. Lymph nodes: No pathologically enlarged cervical chain lymph nodes. As before, there are small lymph nodes within the bilateral neck measuring less than 5 mm in short axis. Specifically, no enlarged lymph node or other abnormality is identified deep to the left-sided skin surface marker. Bilateral retropectoral lymph nodes may be slightly increased in size as compared to the neck CT of 04/30/2019. For instance, a right retropectoral lymph node now measures up to 11 mm in short axis (series 5, image 44) (previously 10 mm). A left retropectoral lymph node now measures up to 16 mm in short axis (series 3, image 90) (previously 15 mm). Vascular: The major vascular structures of the neck are patent. Limited intracranial: No acute intracranial abnormality identified. Visualized orbits: No orbital mass. Mastoids and visualized paranasal sinuses: No significant paranasal sinus disease or mastoid effusion. Skeleton: No acute bony abnormality or aggressive osseous lesion. Reversal of the expected cervical lordosis. Upper chest: Reported separately on concurrently performed CT chest/abdomen/pelvis. IMPRESSION: No pathologically enlarged cervical chain lymph nodes. As before, there are small lymph nodes within the bilateral neck measuring less than 5 mm in short axis. Specifically, no enlarged lymph node or abnormality is identified deep to the left-sided skin surface marker. Mildly enlarged bilateral retropectoral lymph nodes may be slightly increased in size as compared to the prior examination of 04/30/2011, now measuring up to 11 mm in short axis on the right and 16 mm in short axis on the left. Please refer to the separately reported CT chest/abdomen/pelvis for additional findings. Electronically Signed   By: KKellie SimmeringDO   On: 06/13/2020 12:23   MR LUMBAR SPINE WO CONTRAST  Result Date:  06/02/2020 CLINICAL DATA:  Central back pain and right greater than left hip pain EXAM: MRI LUMBAR SPINE WITHOUT CONTRAST TECHNIQUE: Multiplanar, multisequence MR imaging of the lumbar spine was performed. No intravenous contrast was administered. COMPARISON:  August 31, 2019 FINDINGS: Segmentation: There are 5 non-rib bearing lumbar type vertebral bodies with the last intervertebral disc space labeled as L5-S1. Alignment:  Normal Vertebrae: The vertebral body heights are well maintained. No fracture, marrow edema,or pathologic marrow infiltration. There is diffusely decreased T1 signal seen throughout the osseous structures. Mildly increased STIR  signal seen at the endplate of L5. Conus medullaris and cauda equina: Conus extends to the L1 level. Conus and cauda equina appear normal. Paraspinal and other soft tissues: The paraspinal soft tissues are unremarkable. Again noted are extensive para aortic lymphadenopathy as prior exam and grossly unchanged a CT dating back to April 30, 2019. The sacroiliac joints are intact. Disc levels: T12-L1:  No significant canal or neural foraminal narrowing. L1-L2:   No significant canal or neural foraminal narrowing. L2-L3:   No significant canal or neural foraminal narrowing. L3-L4: Minimal broad-based disc bulge with ligamentum flavum hypertrophy, however no significant canal or neural foraminal narrowing. L4-L5: There is a broad-based disc bulge with a right paracentral disc protrusion which contacts bilateral descending L5 nerve roots without impingement. There is mild bilateral neural foraminal narrowing present and mild central canal stenosis. L5-S1:   No significant canal or neural foraminal narrowing. IMPRESSION: 1. 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 Also there is mild bilateral neural foraminal narrowing  and mild central canal stenosis. 2. Stable retroperitoneal lymphadenopathy. 3. Diffuse marrow changes most consistent with marrow reconversion, stable from prior exam. Electronically Signed   By: Prudencio Pair M.D.   On: 06/02/2020 23:34   CT CHEST ABDOMEN PELVIS W CONTRAST  Result Date: 06/13/2020 CLINICAL DATA:  CLL. EXAM: CT CHEST, ABDOMEN, AND PELVIS WITH CONTRAST TECHNIQUE: Multidetector CT imaging of the chest, abdomen and pelvis was performed following the standard protocol during bolus administration of intravenous contrast. CONTRAST:  121m OMNIPAQUE IOHEXOL 300 MG/ML  SOLN COMPARISON:  04/30/2019 and multiple prior studies FINDINGS: CT CHEST FINDINGS Cardiovascular: Minimal calcified atheromatous plaque of the thoracic aorta. No aneurysmal dilation. Central pulmonary vasculature normal on venous phase assessment. Heart size normal without pericardial effusion. Mediastinum/Nodes: Thoracic inlet structures are unremarkable. Bilateral axillary nodal enlargement. LEFT axillary lymph node only mildly enlarged at 9 mm previously 10 mm (image 15 of series 4) Slight increase in size of subpectoral lymph nodes compared to previous imaging and high LEFT axillary lymph node (image 12, series 4) 10 mm LEFT axillary lymph node with rounded morphology on the prior study at 6 mm. RIGHT subpectoral and axillary lymph nodes as well with mild enlargement (image 13 of series 4) 9 mm previously 7 mm. RIGHT subpectoral lymph node also 9 mm previously 6-7 mm. No hilar adenopathy. No mediastinal adenopathy. The esophagus grossly normal. Lungs/Pleura: Centrilobular predominant pulmonary emphysema worse at the lung apices. Stable juxtapleural nodule in the RIGHT chest on image 46 of series 6 in the RIGHT upper lobe approximately 3-4 mm. Small perifissural nodules in the RIGHT mid chest along the minor fissure are unchanged as well, the largest appears to represent an intrapulmonary lymph node. A smaller area of nodularity on  image 82 of series 6 with central lucency, not changed in size approximately 4-5 mm. Airways are patent. No consolidation.  No pleural effusion. Musculoskeletal: No chest wall lesion. Postoperative changes in the RIGHT breast similar to the prior study. See below for full musculoskeletal detail. CT ABDOMEN PELVIS FINDINGS Hepatobiliary: No focal, suspicious hepatic lesion. No pericholecystic stranding. No biliary duct dilation. Pancreas: Pancreas normal without ductal dilation or sign of inflammation. Spleen: Spleen normal in size and contour. Adrenals/Urinary Tract: Adrenal thickening bilaterally with smooth nodularity of LEFT adrenal gland unchanged. Symmetric renal enhancement. No hydronephrosis. Urinary bladder under distended without focal abnormality Stomach/Bowel: Stomach unremarkable. Small  bowel without acute process. Appendix is normal. Stool fills much of the colon with moderately large volume of stool in the colon. Vascular/Lymphatic: Calcified atheromatous plaque and noncalcified plaque of the abdominal aorta, mild and without aneurysmal dilation. LEFT para-aortic nodal tissue remains enlarged (image 82 of series 4) 1.7 cm, previously approximately 2.2 cm. Similar mild enlargement of intra-aortocaval nodal tissue. Juxta caval lymph node in the RIGHT retroperitoneum stable at approximately 7 mm (image 88 of series 4). Lymph nodes track to common iliac chains bilaterally and in the external iliac chains. Nodes remain increased in number throughout the pelvis with the largest lymph node along the LEFT pelvic sidewall measuring 1.1 cm short axis, previously 1.1 cm. Similar RIGHT pelvic sidewall lymph node also unchanged from previous imaging. No inguinal adenopathy Reproductive: No adnexal mass. Other: No ascites.  No free air.  No hernia. Musculoskeletal: No acute bone finding. No destructive bone process. IMPRESSION: 1. Generalized nodal enlargement with similar appearance to the prior study most notably  in the retroperitoneum in the abdomen. Slight increasing conspicuity though less than a cm with respect to short axis of lymph nodes in the chest and decreased size with respect to short axis of dominant lymph node along the LEFT periaortic chain in the abdomen. On balance this is stable to slightly improved as compared to the previous exam. 2. Stable small pulmonary nodules many of these appear to represent small intrapulmonary lymph nodes. Attention on follow-up. 3. Emphysema and aortic atherosclerosis. Aortic Atherosclerosis (ICD10-I70.0) and Emphysema (ICD10-J43.9). Electronically Signed   By: Zetta Bills M.D.   On: 06/13/2020 16:34    ASSESSMENT: CLL,  iron deficiency.  PLAN:    1. CLL: Confirmed by peripheral blood flow cytometry.  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 Patient completed cycle 3 of Rituxan plus Treanda on March 26, 2018, but was noted to have progressive disease.  She was then initiated on 480 mg Imbruvica in November 2019.  Her most recent imaging on June 13, 2020 reviewed independently and reported as above with no obvious recurrent or progressive disease.  Repeat in September 2022.  Patient's white blood cell count is now within normal limits.  No further intervention is needed at this time.  Return to clinic in 3 months with repeat laboratory work and further evaluation.    2.  Leukocytosis: Resolved. 3.  Trigeminal neuralgia: Patient does not complain of this today.  Appears to be unrelated to CLL or Imbruvica.  MRI of the brain on Jan 25, 2019 was unremarkable.   4.  Depression/anxiety: Chronic.  Continue follow-up and treatment per primary care. 5.  Neuropathic pain/peripheral neuropathy: Continue current follow-up and treatment as per neurology. 6.  Iron deficiency: Resolved.  Patient received 1 dose of IV Feraheme on October 21, 2019.  I provided 30 minutes of face-to-face video visit  time during this encounter which included chart review, counseling, and coordination of care as documented above.   Patient expressed understanding and was in agreement with this plan. She also understands that She can call clinic at any time with any questions, concerns, or complaints.    Monique Huger, MD   06/16/2020 3:07 PM

## 2020-06-12 ENCOUNTER — Other Ambulatory Visit: Payer: Self-pay | Admitting: *Deleted

## 2020-06-12 DIAGNOSIS — C9110 Chronic lymphocytic leukemia of B-cell type not having achieved remission: Secondary | ICD-10-CM

## 2020-06-13 ENCOUNTER — Inpatient Hospital Stay: Payer: Medicaid Other | Attending: Oncology

## 2020-06-13 ENCOUNTER — Ambulatory Visit
Admission: RE | Admit: 2020-06-13 | Discharge: 2020-06-13 | Disposition: A | Discharge status: Home / Self Care | Payer: Medicaid Other | Source: Ambulatory Visit | Attending: Oncology | Admitting: Oncology

## 2020-06-13 ENCOUNTER — Other Ambulatory Visit: Payer: Self-pay | Admitting: Oncology

## 2020-06-13 ENCOUNTER — Other Ambulatory Visit: Payer: Self-pay

## 2020-06-13 DIAGNOSIS — C9110 Chronic lymphocytic leukemia of B-cell type not having achieved remission: Secondary | ICD-10-CM

## 2020-06-13 DIAGNOSIS — E278 Other specified disorders of adrenal gland: Secondary | ICD-10-CM | POA: Diagnosis not present

## 2020-06-13 DIAGNOSIS — I700 Atherosclerosis of aorta: Secondary | ICD-10-CM | POA: Diagnosis not present

## 2020-06-13 DIAGNOSIS — N3289 Other specified disorders of bladder: Secondary | ICD-10-CM | POA: Diagnosis not present

## 2020-06-13 DIAGNOSIS — R590 Localized enlarged lymph nodes: Secondary | ICD-10-CM | POA: Diagnosis not present

## 2020-06-13 DIAGNOSIS — J432 Centrilobular emphysema: Secondary | ICD-10-CM | POA: Diagnosis not present

## 2020-06-13 LAB — CBC WITH DIFFERENTIAL/PLATELET
Abs Immature Granulocytes: 0.06 10*3/uL (ref 0.00–0.07)
Basophils Absolute: 0.1 10*3/uL (ref 0.0–0.1)
Basophils Relative: 1 %
Eosinophils Absolute: 0.1 10*3/uL (ref 0.0–0.5)
Eosinophils Relative: 1 %
HCT: 41.1 % (ref 36.0–46.0)
Hemoglobin: 14 g/dL (ref 12.0–15.0)
Immature Granulocytes: 1 %
Lymphocytes Relative: 57 %
Lymphs Abs: 5.8 10*3/uL — ABNORMAL HIGH (ref 0.7–4.0)
MCH: 32.4 pg (ref 26.0–34.0)
MCHC: 34.1 g/dL (ref 30.0–36.0)
MCV: 95.1 fL (ref 80.0–100.0)
Monocytes Absolute: 0.9 10*3/uL (ref 0.1–1.0)
Monocytes Relative: 8 %
Neutro Abs: 3.3 10*3/uL (ref 1.7–7.7)
Neutrophils Relative %: 32 %
Platelets: 166 10*3/uL (ref 150–400)
RBC: 4.32 MIL/uL (ref 3.87–5.11)
RDW: 13.2 % (ref 11.5–15.5)
WBC: 10.2 10*3/uL (ref 4.0–10.5)
nRBC: 0 % (ref 0.0–0.2)

## 2020-06-13 LAB — COMPREHENSIVE METABOLIC PANEL
ALT: 19 U/L (ref 0–44)
AST: 23 U/L (ref 15–41)
Albumin: 3.9 g/dL (ref 3.5–5.0)
Alkaline Phosphatase: 61 U/L (ref 38–126)
Anion gap: 8 (ref 5–15)
BUN: 22 mg/dL — ABNORMAL HIGH (ref 6–20)
CO2: 27 mmol/L (ref 22–32)
Calcium: 8.5 mg/dL — ABNORMAL LOW (ref 8.9–10.3)
Chloride: 104 mmol/L (ref 98–111)
Creatinine, Ser: 0.81 mg/dL (ref 0.44–1.00)
GFR calc Af Amer: >60 mL/min (ref >60)
GFR calc non Af Amer: >60 mL/min (ref >60)
Glucose, Bld: 72 mg/dL (ref 70–99)
Potassium: 4.1 mmol/L (ref 3.5–5.1)
Sodium: 139 mmol/L (ref 135–145)
Total Bilirubin: 0.6 mg/dL (ref 0.3–1.2)
Total Protein: 6.5 g/dL (ref 6.5–8.1)

## 2020-06-13 MED ORDER — IOHEXOL 300 MG/ML  SOLN
100.0000 mL | Freq: Once | INTRAMUSCULAR | Status: AC | PRN
  Administered 2020-06-13: 100 mL via INTRAVENOUS

## 2020-06-14 ENCOUNTER — Ambulatory Visit: Payer: Self-pay

## 2020-06-14 NOTE — Telephone Encounter (Signed)
   TS 2  Monique [name] Female, 49 y.o., 04-05-1971 MRN:  [id number] Phone:  [phone] Monique [name]) PCP:  [name], MD Coverage:  [place] Medicaid Prepaid Health Plan/[facility] Next Appt With Internal Medicine 06/15/2020 at 8:20 AM Message from Beverley Fiedler sent at 06/14/2020 12:03 PM EDT  Summary: Clinical Advice   Patient would like to speak with nurse about her nail bed color being white. Please advise         Call History   Type Contact Phone  06/14/2020 12:03 PM EDT Phone (Incoming) Monique, [name] (Self) [phone] (H)  User: Rainey Pines A   Pt. reports this morning she noted her nailbeds are white in color. She has noticed tingling and numbness in fingertips "for awhile." No other symptoms. Has an appointment tomorrow for a referral. Took a picture of her fingers. Wants to know what PCP thinks. Declined a visit for today. Please advise pt. Answer Assessment - Initial Assessment Questions 1. SYMPTOM: "What is the main symptom you are concerned about?" (e.g., weakness, numbness)     Finger tips numb and tingly 2. ONSET: "When did this start?" (minutes, hours, days; while sleeping)     Today 3. LAST NORMAL: "When was the last time you were normal (no symptoms)?"     Yesterday 4. PATTERN "Does this come and go, or has it been constant since it started?"  "Is it present now?"     Comes and goes 5. CARDIAC SYMPTOMS: "Have you had any of the following symptoms: chest pain, difficulty breathing, palpitations?"     No 6. NEUROLOGIC SYMPTOMS: "Have you had any of the following symptoms: headache, dizziness, vision loss, double vision, changes in speech, unsteady on your feet?"     Nailbeds are white 7. OTHER SYMPTOMS: "Do you have any other symptoms?"     No 8. PREGNANCY: "Is there any chance you are pregnant?" "When was your last menstrual period?"     No  Protocols used: NEUROLOGIC DEFICIT-A-AH

## 2020-06-14 NOTE — Progress Notes (Signed)
Patient ID: Monique [name], female    DOB: July 22, 1971, 49 y.o.   MRN: [id number]  PCP: [name], MD  Chief Complaint  Patient presents with  . Referral    dermatology [facility]  . Nail Problem   Virtual Visit via Video Note  I connected with [name] on 06/15/20 at  8:20 AM EDT by a video enabled telemedicine application and verified that I am speaking with the correct person using two identifiers.  Location: Patient: home  Provider: [facility] clinic   I discussed the limitations of evaluation and management by telemedicine and the availability of in person appointments. The patient expressed understanding and agreed to proceed.     Subjective:   Monique [name] is a 49 y.o. female, presents to clinic with CC of the following:  HPI  Pt presents with rash and peeling to nose and face, she reports is r/t rosacea, used to have meds and creams from dermatology to manage, but she needed another referral in order to get back in. She also note hard bumps scattered to her hands and fingers - unable to see on video And she asks about abnormal color on her nailbeds after getting nails removed about a month ago.  Skin on face/nose - she states is "peeling off in sheet" she denies any recent sunburn, new creams, facial lotions/washes or medications.  No rash or bumps to face right now.   Patient Active Problem List   Diagnosis Date Noted  . 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 Bilateral leg pain 11/03/2019  . Iron deficiency anemia 10/15/2019  . Bilateral arm pain 08/05/2019  . Bilateral hand numbness 08/05/2019  . Weakness of both hands 08/05/2019  . Chronic musculoskeletal pain 07/19/2019  . Chronic neuropathic pain 07/19/2019  . 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 Special screening for malignant neoplasms, colon   . Polyp of sigmoid colon   . Hemorrhoids   . Trigeminal neuralgia 10/05/2018  . Lumbar radiculitis (Right) 09/24/2018  .  Hypocalcemia 06/03/2018  . Vitamin D insufficiency 06/03/2018  . Abnormal MRI, lumbar spine (04/20/2017) 06/03/2018  . Chronic hip pain (Right) 06/03/2018  . Spondylosis without myelopathy or radiculopathy, lumbar region 06/03/2018  . DDD (degenerative disc disease), lumbar 06/02/2018  . Lumbar facet hypertrophy 06/02/2018  . Lumbar facet arthropathy 06/02/2018  . Lumbar facet syndrome (Bilateral) (R>L) 06/02/2018  . Marijuana use 05/19/2018  . Chronic lower extremity pain (Primary Area of Pain) (Bilateral) (R>L) 05/13/2018  . Chronic low back pain (Secondary Area of Pain) (Bilateral) (R>L) w/ sciatica (Bilateral) 05/13/2018  . Chronic pain syndrome 05/13/2018  . Opiate use 05/13/2018  . Disorder of skeletal system 05/13/2018  . Pharmacologic therapy 05/13/2018  . Problems influencing health status 05/13/2018  . Mastalgia 05/05/2018  . Breast mass, right 01/07/2018  . Face pain 12/11/2017  . Tingling 12/11/2017  . Thoracic aortic atherosclerosis ([place]) 08/20/2017  . Emphysema of lung ([place]) 08/20/2017  . Adductor tendinitis 04/23/2017  . Trochanteric bursitis of right hip 04/23/2017  . GERD without esophagitis 12/11/2016  . CLL (chronic lymphocytic leukemia) ([place]) 11/24/2016  . Pap smear abnormality of cervix/human papillomavirus (HPV) positive 09/12/2016  . Stress incontinence 09/04/2016  . B12 deficiency 07/10/2015  . Insomnia, persistent 07/10/2015  . Anorexia nervosa, restricting type 07/10/2015  . Anxiety, generalized 07/10/2015  . H/O suicide attempt 07/10/2015  . Lymphocytosis 07/10/2015  . Obsessive-compulsive disorder 07/10/2015  . Tobacco use 07/10/2015  . History of cervical dysplasia 07/18/2014  Current Outpatient Medications:  .  albuterol (VENTOLIN HFA) 108 (90 Base) MCG/ACT inhaler, Inhale 2 puffs into the lungs every 4 (four) hours as needed for wheezing or shortness of breath., Disp: 8.5 g, Rfl: 0 .  atorvastatin (LIPITOR) 10 MG tablet, Take 1 tablet (10  mg total) by mouth daily., Disp: 90 tablet, Rfl: 1 .  Budeson-Glycopyrrol-Formoterol (BREZTRI AEROSPHERE) 160-9-4.8 MCG/ACT AERO, Inhale 2 puffs into the lungs in the morning and at bedtime., Disp: 10.7 g, Rfl: 5 .  Calcium Carbonate-Vit D-Min (GNP CALCIUM 1200) 1200-1000 MG-UNIT CHEW, Chew 1,200 mg by mouth daily with breakfast. Take in combination with vitamin D and magnesium., Disp: 90 tablet, Rfl: 3 .  DULoxetine (CYMBALTA) 20 MG capsule, Take 1 capsule by mouth 3 (three) times daily., Disp: , Rfl:  .  IMBRUVICA 420 MG TABS, TAKE 1 TABLET BY MOUTH DAILY., Disp: 28 tablet, Rfl: 2 .  lidocaine-prilocaine (EMLA) cream, Apply 1 application topically as needed., Disp: 30 g, Rfl: 1 .  loratadine (CLARITIN) 10 MG tablet, TAKE 1 TABLET BY MOUTH ONCE DAILY IN THE MORNING, Disp: 90 tablet, Rfl: 1 .  Magnesium 500 MG TABS, Take by mouth daily., Disp: , Rfl:  .  meloxicam (MOBIC) 15 MG tablet, Take 1 tablet (15 mg total) by mouth daily., Disp: 90 tablet, Rfl: 1 .  omeprazole (PRILOSEC) 20 MG capsule, Take 1 capsule (20 mg total) by mouth daily., Disp: 90 capsule, Rfl: 1 .  sertraline (ZOLOFT) 100 MG tablet, TAKE 1 AND 1/2 TABLETS BY MOUTH DAILY, Disp: 135 tablet, Rfl: 0 .  SPIRIVA HANDIHALER 18 MCG inhalation capsule, INHALE 1 CAPSULE VIA HANDIHALER ONCE DAILY AT THE SAME TIME EVERY DAY, Disp: 30 capsule, Rfl: 3 .  tiZANidine (ZANAFLEX) 2 MG tablet, Take 1 tablet (2 mg total) by mouth every 8 (eight) hours as needed for muscle spasms., Disp: 90 tablet, Rfl: 5 .  traZODone (DESYREL) 50 MG tablet, Take 75 mg by mouth at bedtime as needed. , Disp: , Rfl:  .  triamcinolone cream (KENALOG) 0.1 %, Apply 1 application topically 2 (two) times daily., Disp: 30 g, Rfl: 0 .  gabapentin (NEURONTIN) 400 MG capsule, Take 1 capsule (400 mg total) by mouth 3 (three) times daily. Follow titration schedule., Disp: 90 capsule, Rfl: 2   Not on File   Social History   Tobacco Use  . Smoking status: Current Every Day  Smoker    Packs/day: 0.50    Years: 31.00    Pack years: 15.50    Types: Cigarettes    Start date: 09/25/1986  . Smokeless tobacco: Never Used  Vaping Use  . Vaping Use: Former  Substance Use Topics  . Alcohol use: Not Currently    Alcohol/week: 0.0 standard drinks    Comment: rarely  . Drug use: Not Currently    Types: Marijuana      Chart Review Today: I personally reviewed active problem list, medication list, allergies, family history, social history, health maintenance, notes from last encounter, lab results, imaging with the patient/caregiver today. I reviewed the chart I can see prior dermatology referral but I cannot see any dermatology appointments or past prescribed medications.  Review of Systems 10 Systems reviewed and are negative for acute change except as noted in the HPI.     Objective:   There were no vitals filed for this visit.  There is no height or weight on file to calculate BMI.  Physical Exam Vitals and nursing note reviewed.  Constitutional:  General: She is not in acute distress.    Appearance: Normal appearance. She is not ill-appearing, toxic-appearing or diaphoretic.     Comments: Well-appearing female appears stated age  Skin:    Comments: Patient was unable to hold her device steady enough for me to see any clear resolution of her skin and the various areas that she reported symptoms of I could not appreciate any redness swelling or peeling on her nose or face did not visualize any bumps or nodules on her hands, nails can see indentations of lines from removed nails and some ridges but cannot see discoloration at proximal nail bed  Neurological:     Mental Status: She is alert.  Psychiatric:        Mood and Affect: Mood normal.      Results for orders placed or performed in visit on 06/13/20  Comprehensive metabolic panel  Result Value Ref Range   Sodium 139 135 - 145 mmol/L   Potassium 4.1 3.5 - 5.1 mmol/L   Chloride 104 98 - 111  mmol/L   CO2 27 22 - 32 mmol/L   Glucose, Bld 72 70 - 99 mg/dL   BUN 22 (H) 6 - 20 mg/dL   Creatinine, Ser 0.81 0.44 - 1.00 mg/dL   Calcium 8.5 (L) 8.9 - 10.3 mg/dL   Total Protein 6.5 6.5 - 8.1 g/dL   Albumin 3.9 3.5 - 5.0 g/dL   AST 23 15 - 41 U/L   ALT 19 0 - 44 U/L   Alkaline Phosphatase 61 38 - 126 U/L   Total Bilirubin 0.6 0.3 - 1.2 mg/dL   GFR calc non Af Amer >60 >60 mL/min   GFR calc Af Amer >60 >60 mL/min   Anion gap 8 5 - 15  CBC with Differential/Platelet  Result Value Ref Range   WBC 10.2 4.0 - 10.5 K/uL   RBC 4.32 3.87 - 5.11 MIL/uL   Hemoglobin 14.0 12.0 - 15.0 g/dL   HCT 41.1 36 - 46 %   MCV 95.1 80.0 - 100.0 fL   MCH 32.4 26.0 - 34.0 pg   MCHC 34.1 30.0 - 36.0 g/dL   RDW 13.2 11.5 - 15.5 %   Platelets 166 150 - 400 K/uL   nRBC 0.0 0.0 - 0.2 %   Neutrophils Relative % 32 %   Neutro Abs 3.3 1.7 - 7.7 K/uL   Lymphocytes Relative 57 %   Lymphs Abs 5.8 (H) 0.7 - 4.0 K/uL   Monocytes Relative 8 %   Monocytes Absolute 0.9 0 - 1 K/uL   Eosinophils Relative 1 %   Eosinophils Absolute 0.1 0 - 0 K/uL   Basophils Relative 1 %   Basophils Absolute 0.1 0 - 0 K/uL   Immature Granulocytes 1 %   Abs Immature Granulocytes 0.06 0.00 - 0.07 K/uL       Assessment & Plan:     ICD-10-CM   1. Rash and nonspecific skin eruption  R21 Ambulatory referral to Dermatology  2. Rosacea  L71.9 Ambulatory referral to Dermatology   needed referral to f/up with Dermatology - previously established with [facility], saw Dr. Nicole Kindred - out of meds, racial rash, peeling to nose   3. Nail disorder, unspecified  L60.9 Ambulatory referral to Dermatology   pt will send picture per mychart - unable to see in video today    I discussed the assessment and treatment plan with the patient. The patient was provided an opportunity to ask questions and all  were answered. The patient agreed with the plan and demonstrated an understanding of the instructions.   The patient was advised to call back  or seek an in-person evaluation if the symptoms worsen or if the condition fails to improve as anticipated.  I provided 20+ minutes of non-face-to-face time during this encounter.     Delsa Grana, PA-C 06/15/20 12:55 PM

## 2020-06-15 ENCOUNTER — Encounter: Payer: Self-pay | Admitting: Family Medicine

## 2020-06-15 ENCOUNTER — Telehealth (INDEPENDENT_AMBULATORY_CARE_PROVIDER_SITE_OTHER): Payer: Medicaid Other | Admitting: Family Medicine

## 2020-06-15 ENCOUNTER — Other Ambulatory Visit: Payer: Self-pay

## 2020-06-15 DIAGNOSIS — L609 Nail disorder, unspecified: Secondary | ICD-10-CM | POA: Diagnosis not present

## 2020-06-15 DIAGNOSIS — L719 Rosacea, unspecified: Secondary | ICD-10-CM

## 2020-06-15 DIAGNOSIS — R21 Rash and other nonspecific skin eruption: Secondary | ICD-10-CM | POA: Diagnosis not present

## 2020-06-16 ENCOUNTER — Inpatient Hospital Stay: Payer: Medicaid Other | Attending: Oncology | Admitting: Oncology

## 2020-06-16 ENCOUNTER — Encounter: Payer: Self-pay | Admitting: Oncology

## 2020-06-16 ENCOUNTER — Other Ambulatory Visit: Payer: Self-pay

## 2020-06-16 DIAGNOSIS — Z419 Encounter for procedure for purposes other than remedying health state, unspecified: Secondary | ICD-10-CM | POA: Diagnosis not present

## 2020-06-16 DIAGNOSIS — C9110 Chronic lymphocytic leukemia of B-cell type not having achieved remission: Secondary | ICD-10-CM

## 2020-06-16 NOTE — Progress Notes (Signed)
Patient reports weakness and some pain in arms and shoulders. Also states she feels tingling in legs and feels something crawling under skin. Denies other concerns at this time.

## 2020-06-21 ENCOUNTER — Encounter: Payer: Self-pay | Admitting: Pain Medicine

## 2020-06-22 ENCOUNTER — Other Ambulatory Visit: Payer: Self-pay | Admitting: Family Medicine

## 2020-06-22 ENCOUNTER — Ambulatory Visit: Payer: Medicaid Other | Attending: Pain Medicine | Admitting: Pain Medicine

## 2020-06-22 ENCOUNTER — Other Ambulatory Visit: Payer: Self-pay

## 2020-06-22 DIAGNOSIS — G894 Chronic pain syndrome: Secondary | ICD-10-CM

## 2020-06-22 DIAGNOSIS — M5441 Lumbago with sciatica, right side: Secondary | ICD-10-CM

## 2020-06-22 DIAGNOSIS — M5442 Lumbago with sciatica, left side: Secondary | ICD-10-CM

## 2020-06-22 DIAGNOSIS — M5136 Other intervertebral disc degeneration, lumbar region: Secondary | ICD-10-CM | POA: Diagnosis not present

## 2020-06-22 DIAGNOSIS — M79605 Pain in left leg: Secondary | ICD-10-CM | POA: Diagnosis not present

## 2020-06-22 DIAGNOSIS — M79604 Pain in right leg: Secondary | ICD-10-CM

## 2020-06-22 DIAGNOSIS — M5416 Radiculopathy, lumbar region: Secondary | ICD-10-CM

## 2020-06-22 DIAGNOSIS — J438 Other emphysema: Secondary | ICD-10-CM

## 2020-06-22 DIAGNOSIS — G8929 Other chronic pain: Secondary | ICD-10-CM | POA: Diagnosis not present

## 2020-06-22 DIAGNOSIS — M5126 Other intervertebral disc displacement, lumbar region: Secondary | ICD-10-CM

## 2020-06-22 MED FILL — IMBRUVICA 420 MG TAB: 420 | 28 days supply | Qty: 28 | Fill #2

## 2020-06-22 NOTE — Progress Notes (Signed)
Patient: Monique [name]  Service Category: E/M  Provider: Gaspar Cola, MD  DOB: 1971-06-14  DOS: 06/22/2020  Location: Office  MRN: [id number]  Setting: Ambulatory outpatient  Referring Provider: [name], MD  Type: Established Patient  Specialty: Interventional Pain Management  PCP: [name], MD  Location: Remote location  Delivery: TeleHealth     Virtual Encounter - Pain Management PROVIDER NOTE: Information contained herein reflects review and annotations entered in association with encounter. Interpretation of such information and data should be left to medically-trained personnel. Information provided to patient can be located elsewhere in the medical record under "Patient Instructions". Document created using STT-dictation technology, any transcriptional errors that may result from process are unintentional.    Contact & Pharmacy Preferred: [phone] Home: [phone] (home) Mobile: [phone] (mobile) E-mail: [email or web address]  [id number] [place] [id number]: [phone]: [address] [place]Phone: [phone]: [phone]  Pre-screening  [name] "in-person" vs "virtual" encounter. She indicated preferring virtual for this encounter.   Reason COVID-19*  Social distancing based on CDC and AMA recommendations.   I contacted TRoxan Hockeyon 06/22/2020 via telephone.      I clearly identified myself as FGaspar Cola MD. I verified that I was speaking with the correct person using two identifiers (Name: Monique [name] and date of birth: [birth year].  Consent I sought verbal advanced consent from TRoxan Hockeyfor virtual visit interactions. I informed Ms. SJolliffeof possible security and privacy concerns, risks, and limitations associated  with providing "not-in-person" medical evaluation and management services. I also informed Ms. SKisterof the availability of "in-person" appointments. Finally, I informed her that there would be a charge for the virtual visit and that she could be  personally, fully or partially, financially responsible for it. Ms. SMergenthalerexpressed understanding and agreed to proceed.   Historic Elements   Ms. TOdean [name] a 49y.o. year old, female patient evaluated today after our last contact on Visit date not found. [name] has a past medical history of Anxiety, Bursitis, Carpal tunnel syndrome, [place], Cervical intraepithelial neoplasia I, Chronic lymphocytic leukemia ([place] ([place]), Depression, Eating disorder, History of self-harm, Insomnia, Obsession, Pap smear abnormality of cervix with LGSIL, Tobacco abuse, and Vitamin B12 deficiency (non anemic). She also  has a past surgical history that includes Cervical biopsy w/ loop electrode excision; Tubal ligation; OTHER SURGICAL HISTORY; Colonoscopy with propofol (N/A, 04/21/2019); and Breast biopsy (Right, 01/05/2018). [name] a current medication list which includes the following prescription(s): advair diskus, albuterol, vitamin c, atorvastatin, betamethasone dipropionate, gnp calcium 1200, duloxetine, imbruvica, lidocaine-prilocaine, loratadine, magnesium, meloxicam, mupirocin ointment, omeprazole, pregabalin, sertraline, tizanidine, triamcinolone cream, vitamin b-12, breztri aerosphere, spiriva handihaler, and trazodone. She  reports that she has been smoking cigarettes. She started smoking about 33 years ago. She has a 15.50 pack-year smoking history. She has never used smokeless tobacco. She reports previous alcohol use. She reports previous drug use. Drug: Marijuana. [name] No Known Allergies.   HPI  Today, she is being contacted for worsening of previously known (established) problem.  The patient indicates recurrence of her  right-sided low back pain and hip pain with severe burning in [place].  On 06/29/2019 we did a right-sided L4 transforaminal epidural steroid injection +  a right-sided L3-4 interlaminar LESI #2 under fluoroscopic guidance and IV sedation which provided her with 100% relief of the pain.  She calls indicating that she has talked to her physicians including her neurologist and they have recommended to proceed with a repeat injection.  We will go ahead and make arrangements for this.  Pharmacotherapy Assessment  Analgesic: No opioid analgesics prescribed by our practice. See 05/13/2018 UDS (+) Undeclared THC.   Monitoring: [place] PMP: PDMP reviewed during this encounter.       Pharmacotherapy: No side-effects or adverse reactions reported. Compliance: No problems identified. Effectiveness: Clinically acceptable. Plan: Refer to "POC".  UDS:  Summary  Date Value Ref Range Status  05/13/2018 FINAL  Final    Comment:    ==================================================================== TOXASSURE COMP DRUG ANALYSIS,UR ==================================================================== Test                             Result       Flag       Units Drug Present and Declared for Prescription Verification   Gabapentin                     PRESENT      EXPECTED Drug Present not Declared for Prescription Verification   Carboxy-THC                    213          UNEXPECTED ng/mg creat    Carboxy-THC is a metabolite of tetrahydrocannabinol  (THC).    Source of [facility] is most commonly illicit, but THC is also present    in a scheduled prescription medication.   Naproxen                       PRESENT      UNEXPECTED Drug Absent but Declared for Prescription Verification   Hydrocodone                    Not Detected UNEXPECTED ng/mg creat   Tramadol                       Not Detected UNEXPECTED ng/mg creat   Citalopram                     Not Detected UNEXPECTED   Prochlorperazine                Not Detected UNEXPECTED   Acetaminophen                  Not Detected UNEXPECTED    Acetaminophen, as indicated in the declared medication list, is    not always detected even when used as directed. ==================================================================== Test                      Result    Flag   Units      Ref Range   Creatinine              218              mg/dL      >=20 ==================================================================== Declared Medications:  The flagging and interpretation on this report are based on the  following declared medications.  Unexpected results may arise from  inaccuracies in the declared medications.  **Note: The testing scope of this panel includes these medications:  Escitalopram (  Lexapro)  Gabapentin (Neurontin)  Hydrocodone (Norco)  Prochlorperazine (Compazine)  Tramadol (Ultram)  **Note: The testing scope of this panel does not include small to  moderate amounts of these reported medications:  Acetaminophen (Norco)  **Note: The testing scope of this panel does not include following  reported medications:  Umeclidinium (Anoro Ellipta)  Varenicline (Chantix)  Vilanterol (Anoro Ellipta) ==================================================================== For clinical consultation, please call [phone]. ====================================================================     Laboratory Chemistry Profile   Renal Lab Results  Component Value Date   BUN 22 (H) 06/13/2020   CREATININE 0.81 06/13/2020   BCR 22 05/13/2018   GFRAA >60 06/13/2020   GFRNONAA >60 06/13/2020     Hepatic Lab Results  Component Value Date   AST 23 06/13/2020   ALT 19 06/13/2020   ALBUMIN 3.9 06/13/2020   ALKPHOS 61 06/13/2020   HCVAB <0.1 01/22/2017     Electrolytes Lab Results  Component Value Date   NA 139 06/13/2020   K 4.1 06/13/2020   CL 104 06/13/2020   CALCIUM 8.5 (L) 06/13/2020   MG 2.1 01/27/2019   PHOS 2.7 01/27/2019      Bone Lab Results  Component Value Date   VD25OH 51 08/31/2019   25OHVITD1 27 (L) 05/13/2018   25OHVITD2 <1.0 05/13/2018   25OHVITD3 27 05/13/2018     Inflammation (CRP: Acute Phase) (ESR: Chronic Phase) Lab Results  Component Value Date   CRP 0.7 08/21/2018   ESRSEDRATE 2 08/21/2018       Note: Above Lab results reviewed.  Imaging  CT CHEST ABDOMEN PELVIS W CONTRAST CLINICAL DATA:  CLL.  EXAM: CT CHEST, ABDOMEN, AND PELVIS WITH CONTRAST  TECHNIQUE: Multidetector CT imaging of the chest, abdomen and pelvis was performed following the standard protocol during bolus administration of intravenous contrast.  CONTRAST:  159m OMNIPAQUE IOHEXOL 300 MG/ML  SOLN  COMPARISON:  04/30/2019 and multiple prior studies  FINDINGS: CT CHEST FINDINGS  Cardiovascular: Minimal calcified atheromatous plaque of the thoracic aorta. No aneurysmal dilation. Central pulmonary vasculature normal on venous phase assessment. Heart size normal without pericardial effusion.  Mediastinum/Nodes: Thoracic inlet structures are unremarkable.  Bilateral axillary nodal enlargement. LEFT axillary lymph node only mildly enlarged at 9 mm previously 10 mm (image 15 of series 4)  Slight increase in size of subpectoral lymph nodes compared to previous imaging and high LEFT axillary lymph node (image 12, series 4) 10 mm LEFT axillary lymph node with rounded morphology on the prior study at 6 mm.  RIGHT subpectoral and axillary lymph nodes as well with mild enlargement (image 13 of series 4) 9 mm previously 7 mm.  RIGHT subpectoral lymph node also 9 mm previously 6-7 mm.  No hilar adenopathy. No mediastinal adenopathy. The esophagus grossly normal.  Lungs/Pleura: Centrilobular predominant pulmonary emphysema worse at the lung apices. Stable juxtapleural nodule in the RIGHT chest on image 46 of series 6 in the RIGHT upper lobe approximately 3-4 mm.  Small perifissural nodules in the RIGHT mid  chest along the minor fissure are unchanged as well, the largest appears to represent an intrapulmonary lymph node. A smaller area of nodularity on image 82 of series 6 with central lucency, not changed in size approximately 4-5 mm. Airways are patent.  No consolidation.  No pleural effusion.  Musculoskeletal: No chest wall lesion. Postoperative changes in the RIGHT breast similar to the prior study. See below for full musculoskeletal detail.  CT ABDOMEN PELVIS FINDINGS  Hepatobiliary: No focal, suspicious hepatic lesion. No pericholecystic stranding. No biliary  duct dilation.  Pancreas: Pancreas normal without ductal dilation or sign of inflammation.  Spleen: Spleen normal in size and contour.  Adrenals/Urinary Tract: Adrenal thickening bilaterally with smooth nodularity of LEFT adrenal gland unchanged. Symmetric renal enhancement. No hydronephrosis. Urinary bladder under distended without focal abnormality  Stomach/Bowel: Stomach unremarkable. Small bowel without acute process. Appendix is normal. Stool fills much of the colon with moderately large volume of stool in the colon.  Vascular/Lymphatic: Calcified atheromatous plaque and noncalcified plaque of the abdominal aorta, mild and without aneurysmal dilation.  LEFT para-aortic nodal tissue remains enlarged (image 82 of series 4) 1.7 cm, previously approximately 2.2 cm.  Similar mild enlargement of intra-aortocaval nodal tissue. Juxta caval lymph node in the RIGHT retroperitoneum stable at approximately 7 mm (image 88 of series 4). Lymph nodes track to common iliac chains bilaterally and in the external iliac chains.  Nodes remain increased in number throughout the pelvis with the largest lymph node along the LEFT pelvic sidewall measuring 1.1 cm short axis, previously 1.1 cm.  Similar RIGHT pelvic sidewall lymph node also unchanged from previous imaging. No inguinal adenopathy  Reproductive: No adnexal  mass.  Other: No ascites.  No free air.  No hernia.  Musculoskeletal: No acute bone finding. No destructive bone process.  IMPRESSION: 1. Generalized nodal enlargement with similar appearance to the prior study most notably in the retroperitoneum in the abdomen. Slight increasing conspicuity though less than a cm with respect to short axis of lymph nodes in the chest and decreased size with respect to short axis of dominant lymph node along the LEFT periaortic chain in the abdomen. On balance this is stable to slightly improved as compared to the previous exam. 2. Stable small pulmonary nodules many of these appear to represent small intrapulmonary lymph nodes. Attention on follow-up. 3. Emphysema and aortic atherosclerosis.  Aortic Atherosclerosis (ICD10-I70.0) and Emphysema (ICD10-J43.9).  Electronically Signed   By: Zetta Bills M.D.   On: 06/13/2020 16:34 CT SOFT TISSUE NECK W CONTRAST CLINICAL DATA:  CLL (chronic lymphocytic leukemia).  Restaging CLL.  EXAM: CT NECK WITH CONTRAST  TECHNIQUE: Multidetector CT imaging of the neck was performed using the standard protocol following the bolus administration of intravenous contrast.  CONTRAST:  19m OMNIPAQUE IOHEXOL 300 MG/ML  SOLN  COMPARISON:  Neck CT 04/30/2019.  FINDINGS: Pharynx and larynx: No appreciable swelling or discrete mass within the oral cavity, pharynx or larynx.  Salivary glands: No inflammation, mass, or stone.  Thyroid: Unremarkable.  Lymph nodes: No pathologically enlarged cervical chain lymph nodes. As before, there are small lymph nodes within the bilateral neck measuring less than 5 mm in short axis. Specifically, no enlarged lymph node or other abnormality is identified deep to the left-sided skin surface marker.  Bilateral retropectoral lymph nodes may be slightly increased in size as compared to the neck CT of 04/30/2019. For instance, a right retropectoral lymph node now measures up  to 11 mm in short axis (series 5, image 44) (previously 10 mm). A left retropectoral lymph node now measures up to 16 mm in short axis (series 3, image 90) (previously 15 mm).  Vascular: The major vascular structures of the neck are patent.  Limited intracranial: No acute intracranial abnormality identified.  Visualized orbits: No orbital mass.  Mastoids and visualized paranasal sinuses: No significant paranasal sinus disease or mastoid effusion.  Skeleton: No acute bony abnormality or aggressive osseous lesion. Reversal of the expected cervical lordosis.  Upper chest: Reported separately on concurrently performed CT chest/abdomen/pelvis.  IMPRESSION: No pathologically enlarged cervical chain lymph nodes. As before, there are small lymph nodes within the bilateral neck measuring less than 5 mm in short axis. Specifically, no enlarged lymph node or abnormality is identified deep to the left-sided skin surface marker.  Mildly enlarged bilateral retropectoral lymph nodes may be slightly increased in size as compared to the prior examination of 04/30/2011, now measuring up to 11 mm in short axis on the right and 16 mm in short axis on the left.  Please refer to the separately reported CT chest/abdomen/pelvis for additional findings.  Electronically Signed   By: Kellie Simmering DO   On: 06/13/2020 12:23  Assessment  The primary encounter diagnosis was Chronic pain syndrome. 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  Plan of Care  Problem-specific:  No problem-specific Assessment & Plan notes found for this encounter.  Ms. Gillie Fleites has a current medication list which includes the following long-term  medication(s): albuterol, atorvastatin, gnp calcium 1200, loratadine, meloxicam, omeprazole, pregabalin, sertraline, tizanidine, spiriva handihaler, and trazodone.  Pharmacotherapy (Medications Ordered): No orders of the defined types were placed in this encounter.  Orders:  Orders Placed This Encounter  Procedures  . Lumbar Transforaminal Epidural    Standing Status:   Future    Standing Expiration Date:   07/23/2020    Scheduling Instructions:     Side: Right-sided     Level: L4     Sedation: Patient's choice.     Timeframe: ASAP    Order Specific Question:   Where will this procedure be performed?    Answer:   [facility]  . Lumbar Epidural Injection    Standing Status:   Future    Standing Expiration Date:   07/23/2020    Scheduling Instructions:     Procedure: Interlaminar Lumbar Epidural Steroid injection (LESI)  L3-4     Laterality: Right-sided     Sedation: Patient's choice.     Timeframe: ASAA    Order Specific Question:   Where will this procedure be performed?    Answer:   [facility]   Follow-up plan:   Return for Procedure (w/ sedation): (R) L4 TFESI #3 + (R) L3-4 LESI #3.      Interventional management options: Planned, scheduled, and/or pending:   None at this time.   Considering:   Diagnostic/therapeutic right L3-4 LESI #3  Diagnostic/therapeutic right L4 TFESI #3  Diagnostic bilateral lumbar facet nerve block #1 Possible bilateral lumbar facet RFA   Palliative PRN treatment(s):   Palliative right L4 TFESI + right L3-4 LESI(IV sedation preferred)       Recent Visits No visits were found meeting these conditions. Showing recent visits within past 90 days and meeting all other requirements Today's Visits Date Type Provider Dept  06/22/20 Telemedicine [name], MD [facility]  Showing today's visits and meeting all other requirements Future Appointments Date Type Provider Dept  08/07/20 Appointment [name], MD [facility]  Showing future appointments within next 90 days and meeting all other requirements  I discussed the assessment and treatment plan with the patient. The patient was provided an opportunity to ask questions and all were answered. The patient agreed with the plan and demonstrated an understanding of the instructions.  Patient  advised to call back or seek an in-person evaluation if the symptoms or condition worsens.  Duration of encounter: 12 minutes.  Note by: Gaspar Cola, MD Date: 06/22/2020; Time: 8:43 AM

## 2020-06-22 NOTE — Patient Instructions (Signed)
____________________________________________________________________________________________  Preparing for Procedure with Sedation  Procedure appointments are limited to planned procedures: . No Prescription Refills. . No disability issues will be discussed. . No medication changes will be discussed.  Instructions: . Oral Intake: Do not eat or drink anything for at least 8 hours prior to your procedure. (Exception: Blood Pressure Medication. See below.) . Transportation: Unless otherwise stated by your physician, you may drive yourself after the procedure. . Blood Pressure Medicine: Do not forget to take your blood pressure medicine with a sip of water the morning of the procedure. If your Diastolic (lower reading)is above 100 mmHg, elective cases will be cancelled/rescheduled. . Blood thinners: These will need to be stopped for procedures. Notify our staff if you are taking any blood thinners. Depending on which one you take, there will be specific instructions on how and when to stop it. . Diabetics on insulin: Notify the staff so that you can be scheduled 1st case in the morning. If your diabetes requires high dose insulin, take only  of your normal insulin dose the morning of the procedure and notify the staff that you have done so. . Preventing infections: Shower with an antibacterial soap the morning of your procedure. . Build-up your immune system: Take 1000 mg of Vitamin C with every meal (3 times a day) the day prior to your procedure. . Antibiotics: Inform the staff if you have a condition or reason that requires you to take antibiotics before dental procedures. . Pregnancy: If you are pregnant, call and cancel the procedure. . Sickness: If you have a cold, fever, or any active infections, call and cancel the procedure. . Arrival: You must be in the facility at least 30 minutes prior to your scheduled procedure. . Children: Do not bring children with you. . Dress appropriately:  Bring dark clothing that you would not mind if they get stained. . Valuables: Do not bring any jewelry or valuables.  Reasons to call and reschedule or cancel your procedure: (Following these recommendations will minimize the risk of a serious complication.) . Surgeries: Avoid having procedures within 2 weeks of any surgery. (Avoid for 2 weeks before or after any surgery). . Flu Shots: Avoid having procedures within 2 weeks of a flu shots or . (Avoid for 2 weeks before or after immunizations). . Barium: Avoid having a procedure within 7-10 days after having had a radiological study involving the use of radiological contrast. (Myelograms, Barium swallow or enema study). . Heart attacks: Avoid any elective procedures or surgeries for the initial 6 months after a "Myocardial Infarction" (Heart Attack). . Blood thinners: It is imperative that you stop these medications before procedures. Let us know if you if you take any blood thinner.  . Infection: Avoid procedures during or within two weeks of an infection (including chest colds or gastrointestinal problems). Symptoms associated with infections include: Localized redness, fever, chills, night sweats or profuse sweating, burning sensation when voiding, cough, congestion, stuffiness, runny nose, sore throat, diarrhea, nausea, vomiting, cold or Flu symptoms, recent or current infections. It is specially important if the infection is over the area that we intend to treat. . Heart and lung problems: Symptoms that may suggest an active cardiopulmonary problem include: cough, chest pain, breathing difficulties or shortness of breath, dizziness, ankle swelling, uncontrolled high or unusually low blood pressure, and/or palpitations. If you are experiencing any of these symptoms, cancel your procedure and contact your primary care physician for an evaluation.  Remember:  Regular Business hours are:    Monday to Thursday 8:00 AM to 4:00 PM  Provider's  Schedule: Janaiya Beauchesne, MD:  Procedure days: Tuesday and Thursday 7:30 AM to 4:00 PM  Bilal Lateef, MD:  Procedure days: Monday and Wednesday 7:30 AM to 4:00 PM ____________________________________________________________________________________________    

## 2020-06-22 NOTE — Telephone Encounter (Signed)
Requested medication (s) are due for refill today -unsure  Requested medication (s) are on the active medication list -yes  Future visit scheduled -yes  Last refill: 01/02/20 3RF  Notes to clinic: Warning for duplicate therapy- [place] 02/29/20 5RF- patient reported not taking both rx 06/21/20 in Rx notes- sent for review  Requested Prescriptions  Pending Prescriptions Disp Refills   SPIRIVA HANDIHALER 18 MCG inhalation capsule [Pharmacy Med Name: SPIRIVA 18 MCG CP-HANDIHALER] 90 capsule 1    Sig: INHALE 1 CAPSULE VIA HANDIHALER ONCE DAILY AT [place]      Pulmonology:  Anticholinergic Agents Passed - 06/22/2020  2:18 PM      Passed - Valid encounter within last 12 months    Recent Outpatient Visits           1 week ago Rash and nonspecific skin eruption   [facility] Delsa Grana, PA-C   3 months ago Centrilobular emphysema [facility])   [facility] [name], MD   7 months ago [facility] [name], MD   9 months ago Well adult exam   [facility] [name], MD   10 months ago Other emphysema [facility])   [facility] [name], MD       Future Appointments             In 2 months [name], MD [facility], [place]   In 2 months [facility], [place], MD [place]                Requested Prescriptions  Pending Prescriptions Disp Refills   [place] 18 MCG inhalation capsule [Pharmacy Med Name: SPIRIVA 18 MCG CP-HANDIHALER] 90 capsule 1    Sig: INHALE 1 CAPSULE VIA HANDIHALER ONCE DAILY AT [place]      Pulmonology:  Anticholinergic Agents Passed - 06/22/2020  2:18 PM      Passed - Valid encounter within last 12 months    Recent Outpatient Visits           1 week ago Rash and nonspecific skin eruption   [facility] Delsa Grana, PA-C   3 months ago  Centrilobular emphysema [facility])   [facility] [name], MD   7 months ago [facility] [name], MD   9 months ago Well adult exam   [facility] [name], MD   10 months ago Other emphysema [facility])   [facility] [name], MD       Future Appointments             In 2 months Ancil Boozer, Drue Stager, MD [facility], [place]   In 2 months [facility], [place], MD [place]

## 2020-06-26 ENCOUNTER — Other Ambulatory Visit: Payer: Self-pay

## 2020-06-26 MED ORDER — METROCREAM 0.75 % EX CREA: TOPICAL_CREAM | CUTANEOUS | 0 refills | Status: DC

## 2020-06-26 NOTE — Progress Notes (Signed)
Refill from [place] to get her to her appt on 09/06/20

## 2020-07-13 ENCOUNTER — Other Ambulatory Visit: Payer: Self-pay | Admitting: Oncology

## 2020-07-13 DIAGNOSIS — C9110 Chronic lymphocytic leukemia of B-cell type not having achieved remission: Secondary | ICD-10-CM

## 2020-07-17 DIAGNOSIS — Z419 Encounter for procedure for purposes other than remedying health state, unspecified: Secondary | ICD-10-CM | POA: Diagnosis not present

## 2020-07-18 ENCOUNTER — Other Ambulatory Visit: Payer: Self-pay | Admitting: Pain Medicine

## 2020-07-18 DIAGNOSIS — G8929 Other chronic pain: Secondary | ICD-10-CM

## 2020-07-18 DIAGNOSIS — M7918 Myalgia, other site: Secondary | ICD-10-CM

## 2020-07-24 MED FILL — IMBRUVICA 420 MG TAB: 420 | 28 days supply | Qty: 28 | Fill #0

## 2020-07-26 ENCOUNTER — Other Ambulatory Visit: Payer: Self-pay | Admitting: Family Medicine

## 2020-07-26 DIAGNOSIS — L299 Pruritus, unspecified: Secondary | ICD-10-CM

## 2020-07-26 NOTE — Telephone Encounter (Signed)
Requested Prescriptions  Pending Prescriptions Disp Refills  . loratadine (CLARITIN) 10 MG tablet [Pharmacy Med Name: LORATADINE 10 MG TABLET] 90 tablet 1    Sig: TAKE 1 TABLET BY MOUTH ONCE DAILY IN THE MORNING     Ear, Nose, and Throat:  Antihistamines Passed - 07/26/2020  1:46 AM      Passed - Valid encounter within last 12 months    Recent Outpatient Visits          1 month ago Rash and nonspecific skin eruption   [facility] [place], Kristeen Miss, PA-C   4 months ago Centrilobular emphysema [facility])   [facility] [name], MD   9 months ago [facility] [name], MD   11 months ago Well adult exam   [facility] [name], MD   11 months ago Other emphysema [facility])   [facility] [name], MD      Future Appointments            In 1 month Ancil Boozer, Drue Stager, MD [facility], [place]   In 1 month [facility], [place], [place]

## 2020-08-01 DIAGNOSIS — R519 Headache, unspecified: Secondary | ICD-10-CM | POA: Diagnosis not present

## 2020-08-01 DIAGNOSIS — M79602 Pain in left arm: Secondary | ICD-10-CM | POA: Diagnosis not present

## 2020-08-01 DIAGNOSIS — M79601 Pain in right arm: Secondary | ICD-10-CM | POA: Diagnosis not present

## 2020-08-01 DIAGNOSIS — M79604 Pain in right leg: Secondary | ICD-10-CM | POA: Diagnosis not present

## 2020-08-07 ENCOUNTER — Encounter: Payer: Self-pay | Admitting: Pain Medicine

## 2020-08-07 ENCOUNTER — Other Ambulatory Visit: Payer: Self-pay

## 2020-08-07 ENCOUNTER — Ambulatory Visit: Payer: Medicaid Other | Attending: Pain Medicine | Admitting: Pain Medicine

## 2020-08-07 VITALS — BP 98/62 | HR 63 | Temp 97.2°F | Ht 67.0 in | Wt 165.0 lb

## 2020-08-07 DIAGNOSIS — E559 Vitamin D deficiency, unspecified: Secondary | ICD-10-CM

## 2020-08-07 DIAGNOSIS — R937 Abnormal findings on diagnostic imaging of other parts of musculoskeletal system: Secondary | ICD-10-CM

## 2020-08-07 DIAGNOSIS — G8929 Other chronic pain: Secondary | ICD-10-CM

## 2020-08-07 DIAGNOSIS — M51369 Other intervertebral disc degeneration, lumbar region without mention of lumbar back pain or lower extremity pain: Secondary | ICD-10-CM

## 2020-08-07 DIAGNOSIS — M7918 Myalgia, other site: Secondary | ICD-10-CM | POA: Insufficient documentation

## 2020-08-07 DIAGNOSIS — M5136 Other intervertebral disc degeneration, lumbar region: Secondary | ICD-10-CM | POA: Insufficient documentation

## 2020-08-07 DIAGNOSIS — M5442 Lumbago with sciatica, left side: Secondary | ICD-10-CM

## 2020-08-07 DIAGNOSIS — M47816 Spondylosis without myelopathy or radiculopathy, lumbar region: Secondary | ICD-10-CM | POA: Diagnosis not present

## 2020-08-07 DIAGNOSIS — M5441 Lumbago with sciatica, right side: Secondary | ICD-10-CM | POA: Diagnosis not present

## 2020-08-07 DIAGNOSIS — M79605 Pain in left leg: Secondary | ICD-10-CM | POA: Diagnosis not present

## 2020-08-07 DIAGNOSIS — M79604 Pain in right leg: Secondary | ICD-10-CM

## 2020-08-07 MED ORDER — MELOXICAM 15 MG PO TABS: 15.0000 mg | ORAL_TABLET | Freq: Every day | ORAL | 2 refills | Status: DC

## 2020-08-07 MED ORDER — GNP CALCIUM 1200 1200-1000 MG-UNIT PO CHEW: 1200.0000 mg | CHEWABLE_TABLET | Freq: Every day | ORAL | 3 refills | Status: DC

## 2020-08-07 MED ORDER — TIZANIDINE HCL 2 MG PO TABS: 2.0000 mg | ORAL_TABLET | Freq: Three times a day (TID) | ORAL | 2 refills | Status: DC | PRN

## 2020-08-07 NOTE — Patient Instructions (Signed)
____________________________________________________________________________________________  Preparing for Procedure with Sedation  Procedure appointments are limited to planned procedures: . No Prescription Refills. . No disability issues will be discussed. . No medication changes will be discussed.  Instructions: . Oral Intake: Do not eat or drink anything for at least 8 hours prior to your procedure. (Exception: Blood Pressure Medication. See below.) . Transportation: Unless otherwise stated by your physician, you may drive yourself after the procedure. . Blood Pressure Medicine: Do not forget to take your blood pressure medicine with a sip of water the morning of the procedure. If your Diastolic (lower reading)is above 100 mmHg, elective cases will be cancelled/rescheduled. . Blood thinners: These will need to be stopped for procedures. Notify our staff if you are taking any blood thinners. Depending on which one you take, there will be specific instructions on how and when to stop it. . Diabetics on insulin: Notify the staff so that you can be scheduled 1st case in the morning. If your diabetes requires high dose insulin, take only  of your normal insulin dose the morning of the procedure and notify the staff that you have done so. . Preventing infections: Shower with an antibacterial soap the morning of your procedure. . Build-up your immune system: Take 1000 mg of Vitamin C with every meal (3 times a day) the day prior to your procedure. . Antibiotics: Inform the staff if you have a condition or reason that requires you to take antibiotics before dental procedures. . Pregnancy: If you are pregnant, call and cancel the procedure. . Sickness: If you have a cold, fever, or any active infections, call and cancel the procedure. . Arrival: You must be in the facility at least 30 minutes prior to your scheduled procedure. . Children: Do not bring children with you. . Dress appropriately:  Bring dark clothing that you would not mind if they get stained. . Valuables: Do not bring any jewelry or valuables.  Reasons to call and reschedule or cancel your procedure: (Following these recommendations will minimize the risk of a serious complication.) . Surgeries: Avoid having procedures within 2 weeks of any surgery. (Avoid for 2 weeks before or after any surgery). . Flu Shots: Avoid having procedures within 2 weeks of a flu shots or . (Avoid for 2 weeks before or after immunizations). . Barium: Avoid having a procedure within 7-10 days after having had a radiological study involving the use of radiological contrast. (Myelograms, Barium swallow or enema study). . Heart attacks: Avoid any elective procedures or surgeries for the initial 6 months after a "Myocardial Infarction" (Heart Attack). . Blood thinners: It is imperative that you stop these medications before procedures. Let us know if you if you take any blood thinner.  . Infection: Avoid procedures during or within two weeks of an infection (including chest colds or gastrointestinal problems). Symptoms associated with infections include: Localized redness, fever, chills, night sweats or profuse sweating, burning sensation when voiding, cough, congestion, stuffiness, runny nose, sore throat, diarrhea, nausea, vomiting, cold or Flu symptoms, recent or current infections. It is specially important if the infection is over the area that we intend to treat. . Heart and lung problems: Symptoms that may suggest an active cardiopulmonary problem include: cough, chest pain, breathing difficulties or shortness of breath, dizziness, ankle swelling, uncontrolled high or unusually low blood pressure, and/or palpitations. If you are experiencing any of these symptoms, cancel your procedure and contact your primary care physician for an evaluation.  Remember:  Regular Business hours are:    Monday to Thursday 8:00 AM to 4:00 PM  Provider's  Schedule: Poppy Mcafee, MD:  Procedure days: Tuesday and Thursday 7:30 AM to 4:00 PM  Bilal Lateef, MD:  Procedure days: Monday and Wednesday 7:30 AM to 4:00 PM ____________________________________________________________________________________________    

## 2020-08-07 NOTE — Progress Notes (Signed)
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 refill. Do not send renewal requests.  . meloxicam (MOBIC) 15 MG tablet    Sig: Take 1 tablet (15 mg total) by mouth daily.    Dispense:  30 tablet    Refill:  2    Fill 1 day early if pharmacy is closed on scheduled refill date. Generic permitted. Void old duplicate prescription / refill. Do not send renewal requests.  . Calcium Carbonate-Vit D-Min (GNP CALCIUM 1200) 1200-1000 MG-UNIT CHEW    Sig: Chew 1,200 mg by mouth daily with breakfast. Take in combination with vitamin D and magnesium.    Dispense:  90 tablet    Refill:  3    Do not place medication on "Automatic Refill".  May substitute with similar over-the-counter  product.   Orders:  Orders Placed This Encounter  Procedures  . Lumbar Epidural Injection    Standing Status:   Future    Standing Expiration Date:   09/06/2020    Scheduling Instructions:     Procedure: Interlaminar Lumbar Epidural Steroid injection (LESI)   L4-5     Laterality: Left-sided     Sedation: With Sedation.     Timeframe: ASAA    Order Specific Question:   Where will this procedure be performed?    Answer:   [facility]  . Lumbar Transforaminal Epidural    Standing Status:   Future    Standing Expiration Date:   09/06/2020    Scheduling Instructions:     Side: Bilateral     Level: L5     Sedation: With Sedation.     Timeframe: ASAA    Order Specific Question:   Where will this procedure be performed?    Answer:   [facility]   Follow-up plan:   Return for Procedure (w/ sedation): (L) L4-5 LESI + (B) L5 TFESI.      Interventional management options: Planned, scheduled, and/or pending:   None at this time.   Considering:   Diagnostic/therapeutic right L3-4 LESI #3  Diagnostic/therapeutic right L4 TFESI #3  Diagnostic bilateral lumbar facet nerve block #1 Possible bilateral lumbar facet RFA   Palliative PRN treatment(s):   Palliative right L4 TFESI + right L3-4 LESI(IV sedation preferred)    Recent Visits Date Type Provider Dept  06/22/20 Telemedicine [name], MD [facility]  Showing recent visits within past 90 days and meeting all other requirements Today's Visits Date Type Provider Dept  08/07/20 Office Visit [name], MD [facility]  Showing today's visits and meeting all other requirements Future Appointments No visits were found meeting these conditions. Showing future appointments within next 90 days and meeting all other requirements  I discussed the assessment and treatment plan with the patient. The patient was provided an opportunity to ask questions and all were answered. The patient agreed with the plan and demonstrated an understanding of the instructions.  Patient advised to call back or seek an in-person evaluation if the symptoms or condition worsens.  Duration of encounter: 48 minutes.  Note by: Gaspar Cola, MD Date:  08/07/2020; Time: 11:33 AM

## 2020-08-07 NOTE — Progress Notes (Signed)
Safety precautions to be maintained throughout the outpatient stay will include: orient to surroundings, keep bed in low position, maintain call bell within reach at all times, provide assistance with transfer out of bed and ambulation.  

## 2020-08-09 ENCOUNTER — Other Ambulatory Visit: Payer: Self-pay

## 2020-08-09 NOTE — Patient Outreach (Signed)
Care Coordination  08/09/2020  Monique [name] 1971/03/11 [id number]   An unsuccessful telephone outreach was attempted today. The patient was referred to the case management team for assistance with care management and care coordination.   Follow Up Plan: The Managed Medicaid care management team will reach out to the patient again over the next 7-14 days.   Aida Raider RN, BSN [place]  [facility] - Managed Medicaid High Risk [phone].

## 2020-08-09 NOTE — Patient Instructions (Signed)
Hi Ms. Garmany, sorry we were unable to reach you today - as a part of your Medicaid benefit, you are eligible for care management and care coordination services at no cost or copay. I was unable to reach you by phone today but would be happy to help you with your health related needs. Please feel free to call me at [phone].   A member of the Managed Medicaid care management team will reach out to you again over the next 7-14 days.  Aida Raider RN, BSN [place]  [facility] - Managed Medicaid High Risk [phone].

## 2020-08-16 DIAGNOSIS — Z419 Encounter for procedure for purposes other than remedying health state, unspecified: Secondary | ICD-10-CM | POA: Diagnosis not present

## 2020-08-17 ENCOUNTER — Telehealth: Payer: Self-pay

## 2020-08-17 NOTE — Telephone Encounter (Signed)
Her insurance denied auth for epidurals you ordered

## 2020-08-21 ENCOUNTER — Other Ambulatory Visit: Payer: Self-pay

## 2020-08-21 NOTE — Patient Instructions (Signed)
Hi Ms. Nicole, sorry we missed you today - as a part of your Medicaid benefit, you are eligible for care management and care coordination services at no cost or copay. I was unable to reach you by phone today but would be happy to help you with your health related needs. Please feel free to call me at [phone].  A member of the Managed Medicaid care management team will reach out to you again over the next 7-14 days.  Aida Raider RN, BSN [place]  [facility] - Managed Medicaid High Risk [phone].

## 2020-08-21 NOTE — Patient Outreach (Signed)
Care Coordination  08/21/2020  Monique [name] 1971-09-15 [id number]    Medicaid Managed Care   Unsuccessful Outreach Note  08/21/2020 Name: Monique [name] MRN: [id number] DOB: [birth year]  Referred by: [name], MD Reason for referral : High Risk Managed Medicaid (follow up)   Monique [name] is enrolled in a Managed Medicaid Health Plan: Yes  A second unsuccessful telephone outreach was attempted today. The patient was referred to the case management team for assistance with care management and care coordination.   Follow Up Plan: The Managed Medicaid care management team will reach out to the patient again over the next 7-14 days.   Aida Raider RN, BSN [place]  [facility] - Managed Medicaid High Risk [phone].

## 2020-08-22 MED FILL — IMBRUVICA 420 MG TAB: 420 | 28 days supply | Qty: 28 | Fill #1
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
## 2020-08-30 ENCOUNTER — Encounter: Payer: Self-pay | Admitting: Family Medicine

## 2020-08-30 ENCOUNTER — Telehealth (INDEPENDENT_AMBULATORY_CARE_PROVIDER_SITE_OTHER): Payer: Medicaid Other | Admitting: Family Medicine

## 2020-08-30 ENCOUNTER — Other Ambulatory Visit: Payer: Self-pay

## 2020-08-30 VITALS — Ht 67.0 in | Wt 165.0 lb

## 2020-08-30 DIAGNOSIS — F4310 Post-traumatic stress disorder, unspecified: Secondary | ICD-10-CM

## 2020-08-30 DIAGNOSIS — G8929 Other chronic pain: Secondary | ICD-10-CM

## 2020-08-30 DIAGNOSIS — F331 Major depressive disorder, recurrent, moderate: Secondary | ICD-10-CM | POA: Diagnosis not present

## 2020-08-30 DIAGNOSIS — E538 Deficiency of other specified B group vitamins: Secondary | ICD-10-CM

## 2020-08-30 DIAGNOSIS — R630 Anorexia: Secondary | ICD-10-CM

## 2020-08-30 DIAGNOSIS — G2581 Restless legs syndrome: Secondary | ICD-10-CM

## 2020-08-30 DIAGNOSIS — K219 Gastro-esophageal reflux disease without esophagitis: Secondary | ICD-10-CM

## 2020-08-30 DIAGNOSIS — E559 Vitamin D deficiency, unspecified: Secondary | ICD-10-CM

## 2020-08-30 DIAGNOSIS — J432 Centrilobular emphysema: Secondary | ICD-10-CM

## 2020-08-30 DIAGNOSIS — M792 Neuralgia and neuritis, unspecified: Secondary | ICD-10-CM | POA: Diagnosis not present

## 2020-08-30 DIAGNOSIS — I700 Atherosclerosis of aorta: Secondary | ICD-10-CM

## 2020-08-30 DIAGNOSIS — G500 Trigeminal neuralgia: Secondary | ICD-10-CM

## 2020-08-30 DIAGNOSIS — C9110 Chronic lymphocytic leukemia of B-cell type not having achieved remission: Secondary | ICD-10-CM | POA: Diagnosis not present

## 2020-08-30 DIAGNOSIS — E611 Iron deficiency: Secondary | ICD-10-CM

## 2020-08-30 MED ORDER — OMEPRAZOLE 20 MG PO CPDR: 20.0000 mg | DELAYED_RELEASE_CAPSULE | Freq: Every day | ORAL | 1 refills | Status: DC

## 2020-08-30 MED ORDER — ADVAIR DISKUS 250-50 MCG/DOSE IN AEPB: 1.0000 | INHALATION_SPRAY | Freq: Two times a day (BID) | RESPIRATORY_TRACT | 1 refills | Status: DC

## 2020-08-30 MED ORDER — FERROUS SULFATE 324 (65 FE) MG PO TBEC: 1.0000 | DELAYED_RELEASE_TABLET | Freq: Three times a day (TID) | ORAL | 0 refills | Status: DC

## 2020-08-30 MED ORDER — ATORVASTATIN CALCIUM 10 MG PO TABS: 10.0000 mg | ORAL_TABLET | Freq: Every day | ORAL | 1 refills | Status: DC

## 2020-08-31 ENCOUNTER — Other Ambulatory Visit: Payer: Self-pay

## 2020-08-31 ENCOUNTER — Other Ambulatory Visit: Payer: Self-pay | Admitting: Obstetrics and Gynecology

## 2020-08-31 NOTE — Patient Outreach (Signed)
Care Coordination - Case Manager  08/31/2020  Monique [name] 09/13/1971 [id number]  Subjective:  Monique [name] is an 49 y.o. year old female who is a primary patient of Monique Sizer, MD.  [name] was given information about Medicaid Managed Care team care coordination services today. Monique [name] agreed to services and verbal consent obtained  Review of patient status, laboratory and other test data was performed as part of evaluation for provision of services.  SDOH: SDOH Screenings   Alcohol Screen: Low Risk   . Last Alcohol Screening Score (AUDIT): 0  Depression (PHQ2-9): Medium Risk  . PHQ-2 Score: 5  Financial Resource Strain: Not on file  Food Insecurity: Not on file  Housing: Not on file  Physical Activity: Not on file  Social Connections: Not on file  Stress: Not on file  Tobacco Use: High Risk  . Smoking Tobacco Use: Current Every Day Smoker  . Smokeless Tobacco Use: Never Used  Transportation Needs: Not on file     Objective:    No Known Allergies  Medications:    Medications Reviewed Today    Reviewed by Gayla Medicus, RN (Registered Nurse) on 08/31/20 at 1447  Med List Status: <None>  Medication Order Taking? Sig Documenting Provider Last Dose Status Informant  ADVAIR DISKUS 250-50 MCG/DOSE AEPB [id number]  Inhale 1 puff into the lungs 2 (two) times daily. Monique Sizer, MD  Active   albuterol (VENTOLIN HFA) 108 (90 Base) MCG/ACT inhaler [id number] No Inhale 2 puffs into the lungs every 4 (four) hours as needed for wheezing or shortness of breath. Monique Sizer, MD Taking Active   Ascorbic Acid (VITAMIN C) 1000 MG tablet [id number] No Take 1,000 mg by mouth daily. [provider] Taking Active   atorvastatin (LIPITOR) 10 MG tablet [id number]  Take 1 tablet (10 mg total) by mouth daily. Monique Sizer, MD  Active   betamethasone dipropionate 0.05 % cream [id number] No APPLY TOPICALLY TWICE DAILY TO AFFECTED AREA AS NEEDED  [provider] Taking Active   Calcium Carbonate-Vit D-Min (GNP CALCIUM 1200) 1200-1000 MG-UNIT CHEW [id number] No Chew 1,200 mg by mouth daily with breakfast. Take in combination with vitamin D and magnesium. [name], MD Taking Active   DULoxetine (CYMBALTA) 20 MG capsule [id number]  Take 20 mg by mouth in the morning, at noon, and at bedtime. Anabel Bene, MD  Active   ferrous sulfate 324 (65 Fe) MG TBEC [id number]  Take 1 tablet (325 mg total) by mouth in the morning, at noon, and at bedtime. Monique Sizer, MD  Active   IMBRUVICA 420 MG TABS [id number] No TAKE 1 TABLET BY MOUTH DAILY. Lloyd Huger, MD Taking Active   lidocaine-prilocaine (EMLA) cream [id number] No Apply 1 application topically as needed. Monique Sizer, MD Taking Active   loratadine (CLARITIN) 10 MG tablet [id number] No TAKE 1 TABLET BY MOUTH ONCE DAILY IN THE Dyanne Iha, Drue Stager, MD Taking Active   Magnesium 500 MG TABS [id number] No Take by mouth daily. [provider] Taking Active   meloxicam (MOBIC) 15 MG tablet [id number] No Take 1 tablet (15 mg total) by mouth daily. [name], MD Taking Active   METROCREAM 0.75 % cream [id number] No Apply thin coat to face twice a day as directed. Moye, [place], MD Taking Active   mupirocin ointment (BACTROBAN) 2 % [id number] No APPLY OINTMENT TOPICALLY TWICE DAILY TO AFFECTED AREA AS NEEDED [provider] Taking Active   omeprazole (PRILOSEC) 20 MG capsule [id number]  Take 1 capsule (20 mg total) by mouth daily. Monique Sizer, MD  Active   pregabalin (LYRICA) 100 MG capsule [id number] No Take 100 mg by mouth 3 (three) times daily. [provider] Taking Active   sertraline (ZOLOFT) 100 MG tablet [id number] No TAKE 1 AND 1/2 TABLETS BY MOUTH DAILY Sowles, Drue Stager, MD Taking Active   tiZANidine (ZANAFLEX) 2 MG tablet [id number] No Take 1 tablet (2 mg total) by mouth every 8 (eight) hours as needed for muscle spasms. [name], MD Taking Active   triamcinolone cream (KENALOG) 0.1 % [id number] No Apply 1 application topically 2 (two) times daily. Monique Sizer, MD Taking Active   vitamin B-12 (CYANOCOBALAMIN) 500 MCG tablet [id number] No Take 500 mcg by mouth daily. [provider] Taking Active   Med List Note [name], RPH-CPP 02/04/19 1518): Ibrutinib filled at [facility] 05-13-18 09-29-18 Received permission from [name] to stop Imbruvica 7 days for procedure.         Assessment:   Goals Addressed            This Visit's Progress   . Cope with Chronic Pain       Timeframe:  Long-Range Goal Priority:  High Start Date:     08/31/20                        Expected End Date:     11/29/20                  Follow Up Date 10/01/20   - practice relaxation or meditation daily - use distraction techniques - use relaxation during pain              Long-Range Goal: Chronic Pain Managed   Start Date: 08/31/2020  Expected End Date: 11/29/2020  This Visit's Progress: Not on track  Priority: High  Note:   Current Barriers:  . Care Coordination needs related to chronic pain.  Patient needs MRI and epidural injection.  To date, insurance has denied approval.  Nurse Case Manager Clinical Goal(s):  Marland Kitchen Over the next 30 days, patient will work with provider to address needs related to approval of needed services. . Over the next 90 days, patient will attend all scheduled medical appointments:   Interventions:  . Inter-disciplinary care team collaboration (see longitudinal plan of care) . Evaluation of current treatment plan related to chronic pain and patient's adherence to plan as established by provider. . Reviewed medications with patient. . Discussed plans with patient for ongoing care management follow up and provided patient with direct contact information for care management team . Reviewed scheduled/upcoming provider appointments.  Patient  Goals/Self-Care Activities Over the next 30 days, patient will:  -Attends all scheduled provider appointments Calls provider office for new concerns or questions  Follow Up Plan: The Managed Medicaid care management team will reach out to the patient again over the next 30 days.  The patient has been provided with contact information for the Managed Medicaid care management team and has been advised to call with any health related questions or concerns.               Plan: RNCM will follow up with patient within 30 days. Follow-up:  Patient agrees to Care Plan and Follow-up.

## 2020-08-31 NOTE — Patient Instructions (Signed)
Hi Monique [name], thank you for speaking with me today.  Monique [name] was given information about Medicaid Managed Care team care coordination services as a part of their [facility] Medicaid benefit. Monique [name] verbally consented to engagement with the [facility] Managed Care team.   For questions related to your [facility] health plan, please call: [phone]  If you would like to schedule transportation through your [facility] plan, please call the following number at least 2 days in advance of your appointment: [phone]  Goals Addressed            This Visit's Progress   . Cope with Chronic Pain       Timeframe:  Long-Range Goal Priority:  High Start Date:     08/31/20                        Expected End Date:     11/29/20                  Follow Up Date 10/01/20   - practice relaxation or meditation daily - use distraction techniques - use relaxation during pain              Long-Range Goal: Chronic Pain Managed   Start Date: 08/31/2020  Expected End Date: 11/29/2020  This Visit's Progress: Not on track  Priority: High  Note:   Current Barriers:  . Care Coordination needs related to chronic pain.  Patient needs MRI and epidural injection.  To date, insurance has denied approval.  Nurse Case Manager Clinical Goal(s):  Marland Kitchen Over the next 30 days, patient will work with provider to address needs related to approval of needed services. . Over the next 90 days, patient will attend all scheduled medical appointments:   Interventions:  . Inter-disciplinary care team collaboration (see longitudinal plan of care) . Evaluation of current treatment plan related to chronic pain and patient's adherence to plan as established by provider. . Reviewed medications with patient. . Discussed plans with patient for ongoing care management follow up and provided patient with direct contact information for care management team . Reviewed scheduled/upcoming provider  appointments.  Patient Goals/Self-Care Activities Over the next 30 days, patient will:  -Attends all scheduled provider appointments Calls provider office for new concerns or questions  Follow Up Plan: The Managed Medicaid care management team will reach out to the patient again over the next 30 days.  The patient has been provided with contact information for the Managed Medicaid care management team and has been advised to call with any health related questions or concerns.           Patient verbalizes understanding of instructions provided today.   The Managed Medicaid care management team will reach out to the patient again over the next 30 days.  The patient has been provided with contact information for the Managed Medicaid care management team and has been advised to call with any health related questions or concerns.   Aida Raider RN, BSN [place]  [facility] [employer] - Managed Medicaid High Risk [phone].

## 2020-09-06 ENCOUNTER — Ambulatory Visit: Payer: Medicaid Other | Admitting: Dermatology

## 2020-09-06 ENCOUNTER — Other Ambulatory Visit: Payer: Self-pay

## 2020-09-06 DIAGNOSIS — Z840 Family history of diseases of the skin and subcutaneous tissue: Secondary | ICD-10-CM | POA: Diagnosis not present

## 2020-09-06 DIAGNOSIS — L409 Psoriasis, unspecified: Secondary | ICD-10-CM | POA: Diagnosis not present

## 2020-09-06 DIAGNOSIS — L719 Rosacea, unspecified: Secondary | ICD-10-CM

## 2020-09-06 MED ORDER — RHOFADE 1 % EX CREA: TOPICAL_CREAM | CUTANEOUS | 3 refills | Status: DC

## 2020-09-06 MED ORDER — ENSTILAR 0.005-0.064 % EX FOAM: CUTANEOUS | 3 refills | Status: DC

## 2020-09-06 NOTE — Progress Notes (Signed)
   Follow-Up Visit   Subjective  Monique [name] is a 48 y.o. female who presents for the following: Rosacea (Patient not currently using medication because she has been out for over a year. Patient states that Metrocream is not covered by her insurance, and she would like a medication that is. ) and bumps on the palms (Has been present for years - she picks at them with tweezers, she would like to discuss treatment options to get rid of them).  The following portions of the chart were reviewed this encounter and updated as appropriate:   Tobacco  Allergies  Meds  Problems  Med Hx  Surg Hx  Fam Hx     Review of Systems:  No other skin or systemic complaints except as noted in HPI or Assessment and Plan.  Objective  Well appearing patient in no apparent distress; mood and affect are within normal limits.  A focused examination was performed including the face and hands. Relevant physical exam findings are noted in the Assessment and Plan.  Objective  Head - Anterior (Face): Dilated vessels of the face.   Objective  B/L hands and feet: Roughness on the palms. Peeling on the feet. Elbows, scalp, knees all clear.    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 Recommend Rhofade or Mirvaso gel QAM.  Rosacea is a chronic progressive skin condition usually affecting the face of adults, causing redness and/or acne bumps. It is treatable but not curable. It sometimes affects the eyes (ocular rosacea) as well. It may respond to topical and/or systemic medication and can flare with stress, sun exposure, alcohol, exercise and some foods.  Daily application of broad spectrum spf 30+ sunscreen to face is recommended to reduce flares.  Oxymetazoline HCl (RHOFADE) 1 % CREA - Head - Anterior (Face)  Psoriasis B/L hands and feet Vs eczema/dyshidrosis of hands.  - Fhx of psoriasis in aunt.  Psoriasis is a chronic non-curable,  but treatable genetic/hereditary disease that may have other systemic features affecting other organ systems such as joints (Psoriatic Arthritis). It is associated with an increased risk of inflammatory bowel disease, heart disease, non-alcoholic fatty liver disease, and depression.    Start Enstilar foam BID x 2 weeks then decrease use to QD 5d/wk.   Calcipotriene-Betameth Diprop (ENSTILAR) 0.005-0.064 % FOAM - B/L hands and feet  Return in about 3 months (around 12/05/2020) for psoriasis follow up .  Luther Redo, CMA, am acting as scribe for [name], MD .  Documentation: I have reviewed the above documentation for accuracy and completeness, and I agree with the above.  [name], MD

## 2020-09-14 ENCOUNTER — Encounter: Payer: Self-pay | Admitting: Dermatology

## 2020-09-16 DIAGNOSIS — Z419 Encounter for procedure for purposes other than remedying health state, unspecified: Secondary | ICD-10-CM | POA: Diagnosis not present

## 2020-09-20 MED FILL — IMBRUVICA 420 MG TAB: 420 | 28 days supply | Qty: 28 | Fill #2

## 2020-09-23 NOTE — Progress Notes (Signed)
[facility]  [phone] [phone]  ID: [name] OB: 08-Jun-1971  MR#: [id number]  NLZ#:[id number]  Patient Care Team: [name], MD as PCP - General (Family Medicine) Lloyd Huger, MD as Consulting Physician (Oncology) Craft, Lorel Monaco, RN as Case Manager   CHIEF COMPLAINT: CLL, iron deficiency.  INTERVAL HISTORY: Patient returns to clinic today for routine 59-monthevaluation.  She continues to tolerate Imbruvica well without significant side effects.  She continues to have multiple neurologic complaints including neuropathy that is being evaluated and managed by neurology.  Patient reports she is now on disability.  She otherwise feels well.  She denies any fevers or night sweats.  She has a good appetite and denies weight loss.  She has no chest pain, shortness of breath, cough, or hemoptysis.  She denies any nausea, vomiting, constipation, or diarrhea.  She has no melena or hematochezia.  She has no urinary complaints.  Patient offers no further specific complaints today.  REVIEW OF SYSTEMS:   Review of Systems  Constitutional: Negative for diaphoresis, fever, malaise/fatigue and weight loss.  HENT: Negative.  Negative for congestion.   Respiratory: Negative.  Negative for cough and shortness of breath.   Cardiovascular: Negative.  Negative for chest pain and leg swelling.  Gastrointestinal: Negative for abdominal pain and constipation.  Genitourinary: Negative.  Negative for dysuria and frequency.  Musculoskeletal: Negative.  Negative for back pain, myalgias and neck pain.  Skin: Negative.   Neurological: Positive for sensory change. Negative for tingling, focal weakness and weakness.  Psychiatric/Behavioral: Negative.  Negative for depression and suicidal ideas. The patient is not nervous/anxious.     As per HPI. Otherwise, a complete review of systems is negative.  PAST MEDICAL HISTORY: Past Medical History:   Diagnosis Date  . Anxiety   . Bursitis    leg pain  . Carpal tunnel syndrome   . Cervical cancer ([place]    hx LEEP over 18 years ago.   . Cervical intraepithelial neoplasia I   . Chronic lymphocytic leukemia ([facility] 2018   Dr FGrayland Ormond . Depression   . Eating disorder   . History of self-harm   . Insomnia   . Obsession   . Pap smear abnormality of cervix with LGSIL   . Tobacco abuse   . Vitamin B12 deficiency (non anemic)     PAST SURGICAL HISTORY: Past Surgical History:  Procedure Laterality Date  . BREAST BIOPSY Right 01/05/2018   [place] biopsy of 2 areas and 1 lymph node, MIXED INFLAMMATION AND GIANT CELL REACTION  . CERVICAL BIOPSY  W/ LOOP ELECTRODE EXCISION    . COLONOSCOPY WITH PROPOFOL N/A 04/21/2019   Procedure: COLONOSCOPY WITH PROPOFOL;  Surgeon: TVirgel Manifold MD;  Location: [facility] ENDOSCOPY;  Service: Gastroenterology;  Laterality: N/A;  . OTHER SURGICAL HISTORY     scar tissue removed from vocal cords  . TUBAL LIGATION      FAMILY HISTORY: Family History  Problem Relation Age of Onset  . Cancer Mother        thyroid  . Alcohol abuse Father   . Alcohol abuse Brother   . Depression Brother   . Bipolar disorder Brother   . ADD / ADHD Son   . Breast cancer Neg Hx     ADVANCED DIRECTIVES (Y/N):  N  HEALTH MAINTENANCE: Social History   Tobacco Use  . Smoking status: Current Every Day Smoker    Packs/day: 0.50    Years: 31.00  Pack years: 15.50    Types: Cigarettes    Start date: 09/25/1986  . Smokeless tobacco: Never Used  Vaping Use  . Vaping Use: Former  Substance Use Topics  . Alcohol use: Not Currently    Alcohol/week: 0.0 standard drinks    Comment: rarely  . Drug use: Not Currently    Types: Marijuana     Colonoscopy:  PAP:  Bone density:  Lipid panel:  No Known Allergies  Current Outpatient Medications  Medication Sig Dispense Refill  . ADVAIR DISKUS 250-50 MCG/DOSE AEPB Inhale 1 puff into the lungs 2 (two) times  daily. 180 each 1  . albuterol (VENTOLIN HFA) 108 (90 Base) MCG/ACT inhaler Inhale 2 puffs into the lungs every 4 (four) hours as needed for wheezing or shortness of breath. 8.5 g 0  . Ascorbic Acid (VITAMIN C) 1000 MG tablet Take 1,000 mg by mouth daily.    Marland Kitchen atorvastatin (LIPITOR) 10 MG tablet Take 1 tablet (10 mg total) by mouth daily. 90 tablet 1  . betamethasone dipropionate 0.05 % cream APPLY TOPICALLY TWICE DAILY TO AFFECTED AREA AS NEEDED    . Calcipotriene-Betameth Diprop (ENSTILAR) 0.005-0.064 % FOAM Apply to aa's of the hands and feet BID x 2 weeks. Then decrease use to QD 5d/wk PRN. 60 g 3  . Calcium Carbonate-Vit D-Min (GNP CALCIUM 1200) 1200-1000 MG-UNIT CHEW Chew 1,200 mg by mouth daily with breakfast. Take in combination with vitamin D and magnesium. 90 tablet 3  . DULoxetine (CYMBALTA) 20 MG capsule Take 20 mg by mouth in the morning, at noon, and at bedtime.    . ferrous sulfate 324 (65 Fe) MG TBEC Take 1 tablet (325 mg total) by mouth in the morning, at noon, and at bedtime. (Patient taking differently: Take 1 tablet by mouth in the morning and at bedtime.) 270 tablet 0  . IMBRUVICA 420 MG TABS TAKE 1 TABLET BY MOUTH DAILY. 28 tablet 2  . lidocaine-prilocaine (EMLA) cream Apply 1 application topically as needed. 30 g 1  . loratadine (CLARITIN) 10 MG tablet TAKE 1 TABLET BY MOUTH ONCE DAILY IN THE MORNING 90 tablet 3  . Magnesium 500 MG TABS Take by mouth daily.    . meloxicam (MOBIC) 15 MG tablet Take 1 tablet (15 mg total) by mouth daily. 30 tablet 2  . METROCREAM 0.75 % cream Apply thin coat to face twice a day as directed. 45 g 0  . mupirocin ointment (BACTROBAN) 2 % APPLY OINTMENT TOPICALLY TWICE DAILY TO AFFECTED AREA AS NEEDED    . omeprazole (PRILOSEC) 20 MG capsule Take 1 capsule (20 mg total) by mouth daily. 90 capsule 1  . Oxymetazoline HCl (RHOFADE) 1 % CREA Apply a thin coat to the entire face every morning. 30 g 3  . pregabalin (LYRICA) 100 MG capsule Take 100 mg by  mouth 3 (three) times daily.    . sertraline (ZOLOFT) 100 MG tablet TAKE 1 AND 1/2 TABLETS BY MOUTH DAILY 135 tablet 0  . tiZANidine (ZANAFLEX) 2 MG tablet Take 1 tablet (2 mg total) by mouth every 8 (eight) hours as needed for muscle spasms. 90 tablet 2  . triamcinolone cream (KENALOG) 0.1 % Apply 1 application topically 2 (two) times daily. 30 g 0  . vitamin B-12 (CYANOCOBALAMIN) 500 MCG tablet Take 500 mcg by mouth daily.     No current facility-administered medications for this visit.    OBJECTIVE: Vitals:   09/28/20 1520  BP: 103/74  Pulse: 74  Resp: 20  Temp: (!) 97.5 F (36.4 C)     Body mass index is 26.63 kg/m.    ECOG FS:0 - Asymptomatic  General: Well-developed, well-nourished, no acute distress. Eyes: Pink conjunctiva, anicteric sclera. HEENT: Normocephalic, moist mucous membranes. Lungs: No audible wheezing or coughing. Heart: Regular rate and rhythm. Abdomen: Soft, nontender, no obvious distention. Musculoskeletal: No edema, cyanosis, or clubbing. Neuro: Alert, answering all questions appropriately. Cranial nerves grossly intact. Skin: No rashes or petechiae noted. Psych: Normal affect. Lymphatics: No palpable lymphadenopathy.   LAB RESULTS:  Lab Results  Component Value Date   NA 137 09/28/2020   K 3.7 09/28/2020   CL 105 09/28/2020   CO2 24 09/28/2020   GLUCOSE 122 (H) 09/28/2020   BUN 16 09/28/2020   CREATININE 0.86 09/28/2020   CALCIUM 8.4 (L) 09/28/2020   PROT 6.0 (L) 09/28/2020   ALBUMIN 3.6 09/28/2020   AST 20 09/28/2020   ALT 13 09/28/2020   ALKPHOS 53 09/28/2020   BILITOT 0.4 09/28/2020   GFRNONAA >60 09/28/2020   GFRAA >60 06/13/2020    Lab Results  Component Value Date   WBC 10.6 (H) 09/28/2020   NEUTROABS 4.8 09/28/2020   HGB 14.3 09/28/2020   HCT 42.0 09/28/2020   MCV 94.8 09/28/2020   PLT 146 (L) 09/28/2020     STUDIES: No results found.  ASSESSMENT: CLL,  iron deficiency.  PLAN:    1. CLL: Confirmed by peripheral  blood flow cytometry.  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 Patient completed cycle 3 of Rituxan plus Treanda on March 26, 2018, but was noted to have progressive disease.  She was then initiated on 480 mg Imbruvica in November 2019.  Her most recent imaging on June 13, 2020 reviewed independently with no obvious recurrent or progressive disease.  Repeat in September 2022.  Previously, patient discontinued Imbruvica temporarily and was noted to have progression of disease.  Her white blood cell count is mildly elevated today.  No intervention is needed at this time.  Return to clinic in 3 months with repeat laboratory work and further evaluation.  2.  Leukocytosis: Mild, monitor. 3.  Trigeminal neuralgia: Patient does not complain of this today.  Appears to be unrelated to CLL or Imbruvica.  MRI of the brain on Jan 25, 2019 was unremarkable.   4.  Depression/anxiety: Chronic.  Continue follow-up and treatment per primary care. 5.  Neuropathic pain/peripheral neuropathy: Chronic and unchanged.  Patient reports she is now on disability.  Continue current follow-up and treatment as per neurology. 6.  Iron deficiency: Resolved.  Patient received 1 dose of IV Feraheme on October 21, 2019.  I spent a total of 30 minutes reviewing chart data, face-to-face evaluation with the patient, counseling and coordination of care as detailed above.    Patient expressed understanding and was in agreement with this plan. She also understands that She can call clinic at any time with any questions, concerns, or complaints.    Lloyd Huger, MD   09/29/2020 11:03 AM

## 2020-09-25 ENCOUNTER — Other Ambulatory Visit: Payer: Self-pay

## 2020-09-25 ENCOUNTER — Other Ambulatory Visit: Payer: Self-pay | Admitting: Obstetrics and Gynecology

## 2020-09-25 NOTE — Patient Instructions (Signed)
Hi Ms. Safi, thank you for speaking with me today.  Ms. Filippone was given information about Medicaid Managed Care team care coordination services as a part of their [facility] Medicaid benefit. Yessenia Maillet verbally consented to engagement with the [facility] Managed Care team.   For questions related to your [facility] health plan, please call: [phone]  If you would like to schedule transportation through your [facility] plan, please call the following number at least 2 days in advance of your appointment: [phone]   Patient Care Plan: Chronic Pain (Adult)    Problem Identified: Chronic Pain Management (Chronic Pain)     Long-Range Goal: Chronic Pain Managed   Start Date: 08/31/2020  Expected End Date: 11/29/2020  Recent Progress: Not on track  Priority: High  Note:    Long-Range Goal: Chronic Pain Managed      Start Date: 08/31/2020   Expected End Date: 11/29/2020   This Visit's Progress: Not on track   Priority: High   Note:    Current Barriers:   Care Coordination needs related to chronic pain.  Patient needs MRI and epidural injection.  To date, insurance has denied approval.     Nurse Case Manager Clinical Goal(s):   Over the next 30 days, patient will work with provider to address needs related to approval of needed services.  Update 09/25/20:  No approval of MRI and epidural to date by insurance.  Continues to take medications as needed.  Over the next 90 days, patient will attend all scheduled medical appointments:      Interventions:   Inter-disciplinary care team collaboration (see longitudinal plan of care)  Evaluation of current treatment plan related to chronic pain and patient's adherence to plan as established by provider.  Reviewed medications with patient.  Discussed plans with patient for ongoing care management follow up and provided patient with direct contact information for care management team  Reviewed  scheduled/upcoming provider appointments.     Patient Goals/Self-Care Activities  Over the next 30 days, patient will:   -Attends all scheduled provider appointments  Calls provider office for new concerns or questions     Follow Up Plan: The Managed Medicaid care management team will reach out to the patient again over the next 30 days.   The patient has been provided with contact information for the Managed Medicaid care management team and has been advised to call with any health related questions or concerns.   RNCM will follow up with provider regarding patient request for DME, chair that provides massage to legs.  Patient feels like this would help her chronic leg pain.                          Plan: RNCM will follow up with patient within 30 days.  Follow-up:  Patient agrees to Care Plan and Follow-up.     Patient Care Plan: Chronic Pain (Adult)    Problem Identified: Pain Management Plan (Chronic Pain)     Goal: Pain Management Plan Developed   Note:   Evidence-based guidance:   Acknowledge patient as the expert in pain self-management.   Partner to set a comfort goal that allows for return to work, school, usual activity and acceptable quality of life.   Assess pain level, usual behavior with and without pain and symptoms that commonly occur with chronic pain, such as fatigue, sleep problems, cognitive and mood disturbance; use a consistent pain scale.   Determine if pain  is associated with mobility or at rest, location, intensity, frequency, duration, recurrence, pattern, triggers, relieving factors and description, such as cramping, burning or aching.    Mutually develop an interdisciplinary, multi-modal and detailed pain management plan with patient and family or caregiver; track the patient's response to pain management and adjust plan as needed.         Patient verbalizes understanding of instructions provided today.   RN Care Manager will  follow up with provider regarding patient request for DME. The Managed Medicaid care management team will reach out to the patient again over the next 30 days.  The patient has been provided with contact information for the Managed Medicaid care management team and has been advised to call with any health related questions or concerns.   Aida Raider RN, BSN [place]  [facility] - Managed Medicaid High Risk [phone].

## 2020-09-25 NOTE — Patient Outreach (Signed)
Medicaid Managed Care   Nurse Care Manager Note  09/25/2020 Name:  Monique [name] MRN:  [id number] DOB:  Dec 12, 1970  Monique [name] is an 50 y.o. year old female who is a primary patient of [name], MD.  The [facility] Managed Care Coordination team was consulted for assistance with:    chronic healthcare management needs.  Ms. Christ was given information about Medicaid Managed Care Coordination team services today. [name] agreed to services and verbal consent obtained.  Engaged with patient by telephone for follow up visit in response to provider referral for case management and/or care coordination services.   Assessments/Interventions:  Review of past medical history, allergies, medications, health status, including review of consultants reports, laboratory and other test data, was performed as part of comprehensive evaluation and provision of chronic care management services.  SDOH (Social Determinants of Health) assessments and interventions performed:   Care Plan  No Known Allergies  Medications Reviewed Today    Reviewed by Gayla Medicus, RN (Registered Nurse) on 09/25/20 at 1104  Med List Status: <None>  Medication Order Taking? Sig Documenting Provider Last Dose Status Informant  ADVAIR DISKUS 250-50 MCG/DOSE AEPB [id number] No Inhale 1 puff into the lungs 2 (two) times daily. [name], MD Taking Active   albuterol (VENTOLIN HFA) 108 (90 Base) MCG/ACT inhaler [id number] No Inhale 2 puffs into the lungs every 4 (four) hours as needed for wheezing or shortness of breath. [name], MD Taking Active   Ascorbic Acid (VITAMIN C) 1000 MG tablet [id number] No Take 1,000 mg by mouth daily. [provider] Taking Active   atorvastatin (LIPITOR) 10 MG tablet [id number] No Take 1 tablet (10 mg total) by mouth daily. [name], MD Taking Active   betamethasone dipropionate 0.05 % cream [id number] No APPLY TOPICALLY TWICE DAILY TO AFFECTED AREA AS  NEEDED [provider] Taking Active   Calcipotriene-Betameth Diprop (ENSTILAR) 0.005-0.064 % FOAM [id number]  Apply to aa's of the hands and feet BID x 2 weeks. Then decrease use to QD 5d/wk PRN. [name], MD  Active   Calcium Carbonate-Vit D-Min [facility] CALCIUM 1200) 1200-1000 MG-UNIT CHEW [id number] No Chew 1,200 mg by mouth daily with breakfast. Take in combination with vitamin D and magnesium. [name], MD Taking Active   DULoxetine (CYMBALTA) 20 MG capsule [id number] No Take 20 mg by mouth in the morning, at noon, and at bedtime. Anabel Bene, MD Taking Active   ferrous sulfate 324 (65 Fe) MG TBEC [id number] No Take 1 tablet (325 mg total) by mouth in the morning, at noon, and at bedtime. [name], MD Taking Active   IMBRUVICA 420 MG TABS [id number] No TAKE 1 TABLET BY MOUTH DAILY. Lloyd Huger, MD Taking Active   lidocaine-prilocaine (EMLA) cream [id number] No Apply 1 application topically as needed. [name], MD Taking Active   loratadine (CLARITIN) 10 MG tablet [id number] No TAKE 1 TABLET BY MOUTH ONCE DAILY IN THE Dyanne Iha, Drue Stager, MD Taking Active   Magnesium 500 MG TABS [id number] No Take by mouth daily. [provider] Taking Active   meloxicam (MOBIC) 15 MG tablet [id number] No Take 1 tablet (15 mg total) by mouth daily. [name], MD Taking Active   METROCREAM 0.75 % cream [id number] No Apply thin coat to face twice a day as directed.  Patient not taking: Reported on 09/06/2020   Laurence Ferrari [place], MD Not Taking Active   mupirocin ointment (BACTROBAN) 2 % [id number] No APPLY OINTMENT TOPICALLY  TWICE DAILY TO AFFECTED AREA AS NEEDED [provider] Taking Active   omeprazole (PRILOSEC) 20 MG capsule [id number] No Take 1 capsule (20 mg total) by mouth daily. Alba CorySowles, Krichna, MD Taking Active   Oxymetazoline HCl (RHOFADE) 1 % CREA [id number]  Apply a thin coat to the entire face every morning. Deirdre EvenerKowalski, David C, MD   Active   pregabalin (LYRICA) 100 MG capsule [id number] No Take 100 mg by mouth 3 (three) times daily. [provider] Taking Active   sertraline (ZOLOFT) 100 MG tablet [id number] No TAKE 1 AND 1/2 TABLETS BY MOUTH DAILY Sowles, Danna HeftyKrichna, MD Taking Active   tiZANidine (ZANAFLEX) 2 MG tablet [id number] No Take 1 tablet (2 mg total) by mouth every 8 (eight) hours as needed for muscle spasms. Delano MetzNaveira, Francisco, MD Taking Active   triamcinolone cream (KENALOG) 0.1 % [id number] No Apply 1 application topically 2 (two) times daily. Alba CorySowles, Krichna, MD Taking Active   vitamin B-12 (CYANOCOBALAMIN) 500 MCG tablet [id number] No Take 500 mcg by mouth daily. [provider] Taking Active   Med List Note [name](Leonard, Alyson N, RPH-CPP 02/04/19 1518): [facility] UDS 05-13-18 09-29-18 Received permission from Dr. Orlie DakinFinnegan to stop Imbruvica 7 days for procedure.          Patient Active Problem List   Diagnosis Date Noted  . Acute right hip pain 06/05/2020  . 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 Bilateral leg pain 11/03/2019  . Iron deficiency anemia 10/15/2019  . Bilateral arm pain 08/05/2019  . Bilateral hand numbness 08/05/2019  . Weakness of both hands 08/05/2019  . Chronic musculoskeletal pain 07/19/2019  . Chronic neuropathic pain 07/19/2019  . 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 Special screening for malignant neoplasms, colon   . Polyp of sigmoid colon   . Hemorrhoids   . Trigeminal neuralgia 10/05/2018  . Lumbar radiculitis (Right) 09/24/2018  . Hypocalcemia 06/03/2018  . Vitamin D insufficiency 06/03/2018  . Abnormal MRI, lumbar spine (06/02/2020) 06/03/2018  . Chronic hip pain (Right) 06/03/2018  . Spondylosis without myelopathy or radiculopathy, lumbar region 06/03/2018  . DDD (degenerative disc disease), lumbar 06/02/2018  . Lumbar facet hypertrophy 06/02/2018  . Lumbar facet arthropathy 06/02/2018  . Lumbar facet syndrome  (Bilateral) (R>L) 06/02/2018  . Marijuana use 05/19/2018  . Chronic lower extremity pain (1ry area of Pain) (Bilateral) (R>L) 05/13/2018  . Chronic low back pain (2ry area of Pain) (Bilateral) (R>L) w/ sciatica (Bilateral) 05/13/2018  . Chronic pain syndrome 05/13/2018  . Opiate use 05/13/2018  . Disorder of skeletal system 05/13/2018  . Pharmacologic therapy 05/13/2018  . Problems influencing health status 05/13/2018  . Mastalgia 05/05/2018  . Breast mass, right 01/07/2018  . Face pain 12/11/2017  . Tingling 12/11/2017  . Thoracic aortic atherosclerosis (HCC) 08/20/2017  . Emphysema of lung (HCC) 08/20/2017  . Adductor tendinitis 04/23/2017  . Trochanteric bursitis of right hip 04/23/2017  . GERD without esophagitis 12/11/2016  . CLL (chronic lymphocytic leukemia) (HCC) 11/24/2016  . Pap smear abnormality of cervix/human papillomavirus (HPV) positive 09/12/2016  . Stress incontinence 09/04/2016  . B12 deficiency 07/10/2015  . Insomnia, persistent 07/10/2015  . Anorexia nervosa, restricting type 07/10/2015  . Anxiety, generalized 07/10/2015  . H/O suicide attempt 07/10/2015  . Lymphocytosis 07/10/2015  . Obsessive-compulsive disorder 07/10/2015  . Tobacco use 07/10/2015  . History of cervical dysplasia 07/18/2014    Conditions to be addressed/monitored per PCP order:  as stated above.  Patient Care Plan: Chronic Pain (Adult)  Problem Identified: Chronic Pain Management (Chronic Pain)     Long-Range Goal: Chronic Pain Managed   Start Date: 08/31/2020  Expected End Date: 11/29/2020  Recent Progress: Not on track  Priority: High  Note:    Long-Range Goal: Chronic Pain Managed      Start Date: 08/31/2020   Expected End Date: 11/29/2020   This Visit's Progress: Not on track   Priority: High   Note:    Current Barriers:   Care Coordination needs related to chronic pain.  Patient needs MRI and epidural injection.  To date, insurance has denied approval.      Nurse Case Manager Clinical Goal(s):   Over the next 30 days, patient will work with provider to address needs related to approval of needed services.  Update 09/25/20:  No approval of MRI and epidural to date by insurance.  Continues to take medications as needed.  Over the next 90 days, patient will attend all scheduled medical appointments:      Interventions:   Inter-disciplinary care team collaboration (see longitudinal plan of care)  Evaluation of current treatment plan related to chronic pain and patient's adherence to plan as established by provider.  Reviewed medications with patient.  Discussed plans with patient for ongoing care management follow up and provided patient with direct contact information for care management team  Reviewed scheduled/upcoming provider appointments.     Patient Goals/Self-Care Activities  Over the next 30 days, patient will:   -Attends all scheduled provider appointments  Calls provider office for new concerns or questions     Follow Up Plan: The Managed Medicaid care management team will reach out to the patient again over the next 30 days.   The patient has been provided with contact information for the Managed Medicaid care management team and has been advised to call with any health related questions or concerns.   RNCM will follow up with provider regarding patient request for DME, chair that provides massage to legs.  Patient feels like this would help her chronic leg pain.                         Plan: RNCM will follow up with patient within 30 days.  Follow-up:  Patient agrees to Care Plan and Follow-up.     Patient Care Plan: Chronic Pain (Adult)    Problem Identified: Pain Management Plan (Chronic Pain)     Goal: Pain Management Plan Developed   Note:   Evidence-based guidance:   Acknowledge patient as the expert in pain self-management.   Partner to set a comfort goal that allows for return to  work, school, usual activity and acceptable quality of life.   Assess pain level, usual behavior with and without pain and symptoms that commonly occur with chronic pain, such as fatigue, sleep problems, cognitive and mood disturbance; use a consistent pain scale.   Determine if pain is associated with mobility or at rest, location, intensity, frequency, duration, recurrence, pattern, triggers, relieving factors and description, such as cramping, burning or aching.    Mutually develop an interdisciplinary, multi-modal and detailed pain management plan with patient and family or caregiver; track the patient's response to pain management and adjust plan as needed.       Follow Up:  Patient agrees to Care Plan and Follow-up.  Plan: The Managed Medicaid care management team will reach out to the patient again over the next 30 days. and The patient has been provided with  contact information for the Managed Medicaid care management team and has been advised to call with any health related questions or concerns.  Date/time of next scheduled RN care management/care coordination outreach:  10/25/20 at 0900.

## 2020-09-26 ENCOUNTER — Other Ambulatory Visit: Payer: Self-pay | Admitting: Obstetrics and Gynecology

## 2020-09-26 NOTE — Patient Outreach (Signed)
Care Coordination  09/26/2020  Monique [name] Jul 22, 1971 [id number]   RNCM called patient to update her regarding DME request.  Patient was given information from Dr. Ancil Boozer.  Patient will follow up with Dr. Ancil Boozer at her next appointment.    Aida Raider RN, BSN [place]  [facility] - Managed Medicaid High Risk [phone].

## 2020-09-28 ENCOUNTER — Inpatient Hospital Stay (HOSPITAL_BASED_OUTPATIENT_CLINIC_OR_DEPARTMENT_OTHER): Payer: Medicaid Other | Admitting: Oncology

## 2020-09-28 ENCOUNTER — Encounter: Payer: Self-pay | Admitting: Oncology

## 2020-09-28 ENCOUNTER — Inpatient Hospital Stay: Payer: Medicaid Other | Attending: Oncology

## 2020-09-28 VITALS — BP 103/74 | HR 74 | Temp 97.5°F | Resp 20 | Wt 170.0 lb

## 2020-09-28 DIAGNOSIS — F32A Depression, unspecified: Secondary | ICD-10-CM | POA: Insufficient documentation

## 2020-09-28 DIAGNOSIS — G620 Drug-induced polyneuropathy: Secondary | ICD-10-CM | POA: Diagnosis not present

## 2020-09-28 DIAGNOSIS — C9110 Chronic lymphocytic leukemia of B-cell type not having achieved remission: Secondary | ICD-10-CM

## 2020-09-28 DIAGNOSIS — F17210 Nicotine dependence, cigarettes, uncomplicated: Secondary | ICD-10-CM | POA: Diagnosis not present

## 2020-09-28 DIAGNOSIS — F419 Anxiety disorder, unspecified: Secondary | ICD-10-CM | POA: Diagnosis not present

## 2020-09-28 LAB — CBC WITH DIFFERENTIAL/PLATELET
Abs Immature Granulocytes: 0.04 10*3/uL (ref 0.00–0.07)
Basophils Absolute: 0.1 10*3/uL (ref 0.0–0.1)
Basophils Relative: 1 %
Eosinophils Absolute: 0.1 10*3/uL (ref 0.0–0.5)
Eosinophils Relative: 1 %
HCT: 42 % (ref 36.0–46.0)
Hemoglobin: 14.3 g/dL (ref 12.0–15.0)
Immature Granulocytes: 0 %
Lymphocytes Relative: 46 %
Lymphs Abs: 4.9 10*3/uL — ABNORMAL HIGH (ref 0.7–4.0)
MCH: 32.3 pg (ref 26.0–34.0)
MCHC: 34 g/dL (ref 30.0–36.0)
MCV: 94.8 fL (ref 80.0–100.0)
Monocytes Absolute: 0.7 10*3/uL (ref 0.1–1.0)
Monocytes Relative: 7 %
Neutro Abs: 4.8 10*3/uL (ref 1.7–7.7)
Neutrophils Relative %: 45 %
Platelets: 146 10*3/uL — ABNORMAL LOW (ref 150–400)
RBC: 4.43 MIL/uL (ref 3.87–5.11)
RDW: 13.2 % (ref 11.5–15.5)
WBC: 10.6 10*3/uL — ABNORMAL HIGH (ref 4.0–10.5)
nRBC: 0 % (ref 0.0–0.2)

## 2020-09-28 LAB — COMPREHENSIVE METABOLIC PANEL
ALT: 13 U/L (ref 0–44)
AST: 20 U/L (ref 15–41)
Albumin: 3.6 g/dL (ref 3.5–5.0)
Alkaline Phosphatase: 53 U/L (ref 38–126)
Anion gap: 8 (ref 5–15)
BUN: 16 mg/dL (ref 6–20)
CO2: 24 mmol/L (ref 22–32)
Calcium: 8.4 mg/dL — ABNORMAL LOW (ref 8.9–10.3)
Chloride: 105 mmol/L (ref 98–111)
Creatinine, Ser: 0.86 mg/dL (ref 0.44–1.00)
GFR, Estimated: >60 mL/min (ref >60)
Glucose, Bld: 122 mg/dL — ABNORMAL HIGH (ref 70–99)
Potassium: 3.7 mmol/L (ref 3.5–5.1)
Sodium: 137 mmol/L (ref 135–145)
Total Bilirubin: 0.4 mg/dL (ref 0.3–1.2)
Total Protein: 6 g/dL — ABNORMAL LOW (ref 6.5–8.1)

## 2020-09-28 LAB — PHOSPHORUS: Phosphorus: 3 mg/dL (ref 2.5–4.6)

## 2020-09-28 LAB — MAGNESIUM: Magnesium: 1.9 mg/dL (ref 1.7–2.4)

## 2020-10-02 LAB — COMP PANEL: LEUKEMIA/LYMPHOMA: Immunophenotypic Profile: 22

## 2020-10-10 ENCOUNTER — Other Ambulatory Visit: Payer: Self-pay | Admitting: Oncology

## 2020-10-10 DIAGNOSIS — C9110 Chronic lymphocytic leukemia of B-cell type not having achieved remission: Secondary | ICD-10-CM

## 2020-10-20 MED FILL — IMBRUVICA 420 MG TAB: 420 | 28 days supply | Qty: 28 | Fill #0

## 2020-10-23 ENCOUNTER — Encounter: Payer: Self-pay | Admitting: Family Medicine

## 2020-10-23 ENCOUNTER — Ambulatory Visit: Payer: Self-pay | Admitting: *Deleted

## 2020-10-23 ENCOUNTER — Other Ambulatory Visit: Payer: Self-pay

## 2020-10-23 ENCOUNTER — Ambulatory Visit: Payer: Medicaid Other | Admitting: Family Medicine

## 2020-10-23 VITALS — BP 118/70 | HR 81 | Temp 98.3°F | Resp 16 | Ht 67.0 in | Wt 170.8 lb

## 2020-10-23 DIAGNOSIS — L02811 Cutaneous abscess of head [any part, except face]: Secondary | ICD-10-CM

## 2020-10-23 DIAGNOSIS — F419 Anxiety disorder, unspecified: Secondary | ICD-10-CM | POA: Diagnosis not present

## 2020-10-23 DIAGNOSIS — C9110 Chronic lymphocytic leukemia of B-cell type not having achieved remission: Secondary | ICD-10-CM

## 2020-10-23 MED ORDER — AMOXICILLIN-POT CLAVULANATE 875-125 MG PO TABS: 1.0000 | ORAL_TABLET | Freq: Two times a day (BID) | ORAL | 0 refills | Status: DC

## 2020-10-23 MED ORDER — HYDROCODONE-ACETAMINOPHEN 5-300 MG PO TABS: 1.0000 | ORAL_TABLET | Freq: Four times a day (QID) | ORAL | 0 refills | Status: AC | PRN

## 2020-10-23 NOTE — Telephone Encounter (Signed)
Summary: Clinical advice   PT states she has an infection on the side of her head. PT was upset because she wanted an appt today but there were no openings, PT has physical scheduled for 2/10 but does not want to wait that long. PT states she is a cancer survivor and needs to talk to someone ASAP. I mentioned urgent care but PT does not want to go there.     Call to patient- reports she has cyst/boil- left side of head above the ear- very painful. 1/2 inch wide with surrounding redness-1-2 inches.Advised warm compress- until seen- call to office- appointment scheduled.  Reason for Disposition . SEVERE pain (e.g., excruciating)  Answer Assessment - Initial Assessment Questions 1. APPEARANCE of BOIL: "What does the boil look like?"      Red 1-2 inches- knot with white head on it 2. LOCATION: "Where is the boil located?"      Left side of head above the ear 3. NUMBER: "How many boils are there?"      1  4. SIZE: "How big is the boil?" (e.g., inches, cm; compare to size of a coin or other object)     1/2 inch 5. ONSET: "When did the boil start?"     3-4 days 6. PAIN: "Is there any pain?" If Yes, ask: "How bad is the pain?"   (Scale 1-10; or mild, moderate, severe)     Yes- severe 7. FEVER: "Do you have a fever?" If Yes, ask: "What is it, how was it measured, and when did it start?"      No fever 8. SOURCE: "Have you been around anyone with boils or other Staph infections?" "Have you ever had boils before?"     no 9. OTHER SYMPTOMS: "Do you have any other symptoms?" (e.g., shaking chills, weakness, rash elsewhere on body)     no 10. PREGNANCY: "Is there any chance you are pregnant?" "When was your last menstrual period?"       n/a  Protocols used: BOIL (SKIN ABSCESS)-A-AH

## 2020-10-23 NOTE — Progress Notes (Signed)
Name: Monique [name]   MRN: [id number]    DOB: [birth year]   Date:10/23/2020       Progress Note  Subjective  Chief Complaint  Acute visit for boil/cyst  HPI  CLL: under the care of [name], she is on Imbruvica and tolerates it well. Last white count a couple of weeks ago was 10.6 Denies fever and chills.   MDD: she states she have been very forgetful, explained anxiety and depression can do that, reminded her that I can place referral to psychiatrist   Cyst scalp: she noticed a growth on side of head a few days ago, initially felt a like a bruise but is throbbing now, last night she applied hot compresses and daughter squeezed a little purulent discharge last night. It is affecting her ability to sleep because the pain is so intense  Patient Active Problem List   Diagnosis Date Noted  . Acute right hip pain 06/05/2020  . 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 Bilateral leg pain 11/03/2019  . Iron deficiency anemia 10/15/2019  . Bilateral arm pain 08/05/2019  . Bilateral hand numbness 08/05/2019  . Weakness of both hands 08/05/2019  . Chronic musculoskeletal pain 07/19/2019  . Chronic neuropathic pain 07/19/2019  . 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 Special screening for malignant neoplasms, colon   . Polyp of sigmoid colon   . Hemorrhoids   . Trigeminal neuralgia 10/05/2018  . Lumbar radiculitis (Right) 09/24/2018  . Hypocalcemia 06/03/2018  . Vitamin D insufficiency 06/03/2018  . Abnormal MRI, lumbar spine (06/02/2020) 06/03/2018  . Chronic hip pain (Right) 06/03/2018  . Spondylosis without myelopathy or radiculopathy, lumbar region 06/03/2018  . DDD (degenerative disc disease), lumbar 06/02/2018  . Lumbar facet hypertrophy 06/02/2018  . Lumbar facet arthropathy 06/02/2018  . Lumbar facet syndrome (Bilateral) (R>L) 06/02/2018  . Marijuana use 05/19/2018  . Chronic lower extremity pain (1ry area of Pain) (Bilateral) (R>L) 05/13/2018  . Chronic low  back pain (2ry area of Pain) (Bilateral) (R>L) w/ sciatica (Bilateral) 05/13/2018  . Chronic pain syndrome 05/13/2018  . Opiate use 05/13/2018  . Disorder of skeletal system 05/13/2018  . Pharmacologic therapy 05/13/2018  . Problems influencing health status 05/13/2018  . Mastalgia 05/05/2018  . Breast mass, right 01/07/2018  . Face pain 12/11/2017  . Tingling 12/11/2017  . Thoracic aortic atherosclerosis ([place]) 08/20/2017  . Emphysema of lung ([place]) 08/20/2017  . Adductor tendinitis 04/23/2017  . Trochanteric bursitis of right hip 04/23/2017  . GERD without esophagitis 12/11/2016  . CLL (chronic lymphocytic leukemia) ([place]) 11/24/2016  . Pap smear abnormality of cervix/human papillomavirus (HPV) positive 09/12/2016  . Stress incontinence 09/04/2016  . B12 deficiency 07/10/2015  . Insomnia, persistent 07/10/2015  . Anorexia nervosa, restricting type 07/10/2015  . Anxiety, generalized 07/10/2015  . H/O suicide attempt 07/10/2015  . Lymphocytosis 07/10/2015  . Obsessive-compulsive disorder 07/10/2015  . Tobacco use 07/10/2015  . History of cervical dysplasia 07/18/2014    Past Surgical History:  Procedure Laterality Date  . BREAST BIOPSY Right 01/05/2018   US guided biopsy of 2 areas and 1 lymph node, MIXED INFLAMMATION AND GIANT CELL REACTION  . CERVICAL BIOPSY  W/ LOOP ELECTRODE EXCISION    . COLONOSCOPY WITH PROPOFOL N/A 04/21/2019   Procedure: COLONOSCOPY WITH PROPOFOL;  Surgeon: Virgel Manifold, MD;  Location: [facility];  Service: Gastroenterology;  Laterality: N/A;  . OTHER SURGICAL HISTORY     scar tissue removed from vocal cords  . TUBAL LIGATION  Family History  Problem Relation Age of Onset  . Cancer Mother        thyroid  . Alcohol abuse Father   . Alcohol abuse Brother   . Depression Brother   . Bipolar disorder Brother   . ADD / ADHD Son   . Breast cancer Neg Hx     Social History   Tobacco Use  . Smoking status: Current Every Day Smoker     Packs/day: 0.50    Years: 31.00    Pack years: 15.50    Types: Cigarettes    Start date: 09/25/1986  . Smokeless tobacco: Never Used  Substance Use Topics  . Alcohol use: Not Currently    Alcohol/week: 0.0 standard drinks    Comment: rarely     Current Outpatient Medications:  .  ADVAIR DISKUS 250-50 MCG/DOSE AEPB, Inhale 1 puff into the lungs 2 (two) times daily., Disp: 180 each, Rfl: 1 .  albuterol (VENTOLIN HFA) 108 (90 Base) MCG/ACT inhaler, Inhale 2 puffs into the lungs every 4 (four) hours as needed for wheezing or shortness of breath., Disp: 8.5 g, Rfl: 0 .  Ascorbic Acid (VITAMIN C) 1000 MG tablet, Take 1,000 mg by mouth daily., Disp: , Rfl:  .  atorvastatin (LIPITOR) 10 MG tablet, Take 1 tablet (10 mg total) by mouth daily., Disp: 90 tablet, Rfl: 1 .  betamethasone dipropionate 0.05 % cream, APPLY TOPICALLY TWICE DAILY TO AFFECTED AREA AS NEEDED, Disp: , Rfl:  .  Calcipotriene-Betameth Diprop (ENSTILAR) 0.005-0.064 % FOAM, Apply to aa's of the hands and feet BID x 2 weeks. Then decrease use to QD 5d/wk PRN., Disp: 60 g, Rfl: 3 .  Calcium Carbonate-Vit D-Min (GNP CALCIUM 1200) 1200-1000 MG-UNIT CHEW, Chew 1,200 mg by mouth daily with breakfast. Take in combination with vitamin D and magnesium., Disp: 90 tablet, Rfl: 3 .  DULoxetine (CYMBALTA) 20 MG capsule, Take 20 mg by mouth in the morning, at noon, and at bedtime., Disp: , Rfl:  .  ferrous sulfate 324 (65 Fe) MG TBEC, Take 1 tablet (325 mg total) by mouth in the morning, at noon, and at bedtime. (Patient taking differently: Take 1 tablet by mouth in the morning and at bedtime.), Disp: 270 tablet, Rfl: 0 .  IMBRUVICA 420 MG TABS, TAKE 1 TABLET BY MOUTH DAILY., Disp: 28 tablet, Rfl: 2 .  lidocaine-prilocaine (EMLA) cream, Apply 1 application topically as needed., Disp: 30 g, Rfl: 1 .  loratadine (CLARITIN) 10 MG tablet, TAKE 1 TABLET BY MOUTH ONCE DAILY IN THE MORNING, Disp: 90 tablet, Rfl: 3 .  Magnesium 500 MG TABS, Take by mouth  daily., Disp: , Rfl:  .  meloxicam (MOBIC) 15 MG tablet, Take 1 tablet (15 mg total) by mouth daily., Disp: 30 tablet, Rfl: 2 .  METROCREAM 0.75 % cream, Apply thin coat to face twice a day as directed., Disp: 45 g, Rfl: 0 .  mupirocin ointment (BACTROBAN) 2 %, APPLY OINTMENT TOPICALLY TWICE DAILY TO AFFECTED AREA AS NEEDED, Disp: , Rfl:  .  omeprazole (PRILOSEC) 20 MG capsule, Take 1 capsule (20 mg total) by mouth daily., Disp: 90 capsule, Rfl: 1 .  Oxymetazoline HCl (RHOFADE) 1 % CREA, Apply a thin coat to the entire face every morning., Disp: 30 g, Rfl: 3 .  pregabalin (LYRICA) 100 MG capsule, Take 100 mg by mouth 3 (three) times daily., Disp: , Rfl:  .  sertraline (ZOLOFT) 100 MG tablet, TAKE 1 AND 1/2 TABLETS BY MOUTH DAILY, Disp: 135 tablet,  Rfl: 0 .  tiZANidine (ZANAFLEX) 2 MG tablet, Take 1 tablet (2 mg total) by mouth every 8 (eight) hours as needed for muscle spasms., Disp: 90 tablet, Rfl: 2 .  triamcinolone cream (KENALOG) 0.1 %, Apply 1 application topically 2 (two) times daily., Disp: 30 g, Rfl: 0 .  vitamin B-12 (CYANOCOBALAMIN) 500 MCG tablet, Take 500 mcg by mouth daily., Disp: , Rfl:   No Known Allergies  I personally reviewed active problem list, medication list, allergies, family history, social history with the patient/caregiver today.   ROS  Ten systems reviewed and is negative except as mentioned in HPI   Objective  Vitals:   10/23/20 1006  BP: 118/70  Pulse: 81  Resp: 16  Temp: 98.3 F (36.8 C)  TempSrc: Oral  SpO2: 97%  Weight: 170 lb 12.8 oz (77.5 kg)  Height: 5' 7"  (1.702 m)    Body mass index is 26.75 kg/m.  Physical Exam  Constitutional: Patient appears well-developed and well-nourished.No distress.  HEENT: head atraumatic, normocephalic, pupils equal and reactive to light, neck supple Cardiovascular: Normal rate, regular rhythm and normal heart sounds.  No murmur heard. No BLE edema. Pulmonary/Chest: Effort normal and breath sounds normal. No  respiratory distress. Abdominal: Soft.  There is no tenderness. Skin: scalp on left side of scalp, some drainage, but very small amount, discussed I&D, she will follow up in 3 days  Psychiatric: Patient has a normal mood and affect. behavior is normal. Judgment and thought content normal.   Recent Results (from the past 2160 hour(s))  Magnesium     Status: None   Collection Time: 09/28/20  2:52 PM  Result Value Ref Range   Magnesium 1.9 1.7 - 2.4 mg/dL    Comment: Performed at [facility], [address] [place], [place] [address]  Phosphorus     Status: None   Collection Time: 09/28/20  2:52 PM  Result Value Ref Range   Phosphorus 3.0 2.5 - 4.6 mg/dL    Comment: Performed at [facility], [address] [place], [place] [address]  Flow cytometry panel-leukemia/lymphoma work-up     Status: None   Collection Time: 09/28/20  2:52 PM  Result Value Ref Range   PATH INTERP XXX-IMP Comment     Comment: (NOTE) Involvement by a CD5+, CD23+, CD20+(very dim), CD22+ clonal B-cell population < 5,000/ul, CLL/SLL phenotype, see comment.    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 A prior phenotyping result showed CLL/SLL phenotype.    CLINICAL INFO Comment     Comment: (NOTE) Accompanying CBC dated 09/28/2020 shows: WBC count 10.6, Neu 4.8, Lym 4.9, Mon 0.7    Specimen Type Comment     Comment: Peripheral blood   ASSESSMENT OF LEUKOCYTES Comment     Comment: (NOTE) A CD5+, CD23+, CD38- monoclonal B cell population is detected with lambda light chain restriction. There is no loss of, or aberrant expression of, the pan T cell antigens to suggest a neoplastic T cell process. CD4:CD8 ratio 1.2 A small population (6% of the T cells) of double positive (CD4+/CD8+) T cells is detected. Double positive T cells have been  described in association with chronic viral infections, autoimmune disorders, chronic inflammatory disorders, and immunodeficiency states. No circulating blasts are detected. There is no immunophenotypic  evidence of abnormal myeloid maturation. Analysis of the leukocyte population shows: granulocytes 56%, monocytes 5%, lymphocytes 39%,  blasts <0.1%, B cells 22%, T cells 16%, NK cells 1%.    % Viable Cells Comment     Comment: 96%   Immunophenotypic Profile 22% of total cells (Phenotype below)     Comment: Comment Abnormal cell population: present    ANALYSIS AND GATING STRATEGY Comment     Comment: (NOTE) The notes are corrections to mistakes I have found in your data entry. I have also received cases back from the hemepaths that need corrections to your data entry from the last few times I have covered for [facility].    IMMUNOPHENOTYPING STUDY Comment     Comment: (NOTE) CD2       See Text       CD3       (-) CD4       (-)            CD5       (+) CD7       (-)            CD8       (-) CD10      (-)            CD11b     (-) CD11c     (+) Dim        CD13      (-) CD14      (-)            CD15      (-) CD16      (-)            CD19      (+) CD20      (+) Dim        CD22      (+) Dim CD23      (+) Bright     CD33      (-) CD34      (-)            CD38      (-) CD45      (+)            CD56      (-) CD57      (-)            CD103     (-) CD117     (-)            FMC-7     (-) HLA-DR    (+)            KAPPA     (-) LAMBDA    (+) Dim        CD64      (-)    PATHOLOGIST NAME Comment     Comment: Henrietta Hoover, M.D.   COMMENT: Comment     Comment: (NOTE) Each antibody in this assay was utilized to assess for potential abnormalities of studied cell populations or to characterize identified abnormalities. This test was developed and its performance characteristics determined by [facility].  It has not been cleared or approved by the U.S. Food and Drug Administration. The FDA has  determined that such clearance or approval is not necessary. This test is used for clinical purposes.  It should not be regarded as investigational or for research. Performed At: [facility] [address] [place], [place] [id number] Katina Degree MDPhD [id number] Performed At: [facility] [address] [place], [place] [id number] Katina Degree MDPhD [id number]  Comprehensive metabolic panel     Status: Abnormal   Collection Time: 09/28/20  2:52 PM  Result Value Ref Range   Sodium 137 135 - 145 mmol/L   Potassium 3.7 3.5 - 5.1 mmol/L   Chloride 105 98 - 111 mmol/L   CO2 24 22 - 32 mmol/L   Glucose, Bld 122 (H) 70 - 99 mg/dL    Comment: Glucose reference range applies only to samples taken after fasting for at least 8 hours.   BUN 16 6 - 20 mg/dL   Creatinine, Ser 0.86 0.44 - 1.00 mg/dL   Calcium 8.4 (L) 8.9 - 10.3 mg/dL   Total Protein 6.0 (L) 6.5 - 8.1 g/dL   Albumin 3.6 3.5 - 5.0 g/dL   AST 20 15 - 41 U/L   ALT 13 0 - 44 U/L   Alkaline Phosphatase 53 38 - 126 U/L   Total Bilirubin 0.4 0.3 - 1.2 mg/dL   GFR, Estimated >60 >60 mL/min    Comment: (NOTE) Calculated using the CKD-EPI Creatinine Equation (2021)    Anion gap 8 5 - 15    Comment: Performed at [facility], [address] [place], [place] [address]  CBC with Differential/Platelet     Status: Abnormal   Collection Time: 09/28/20  2:52 PM  Result Value Ref Range   WBC 10.6 (H) 4.0 - 10.5 K/uL   RBC 4.43 3.87 - 5.11 MIL/uL   Hemoglobin 14.3 12.0 - 15.0 g/dL   HCT 42.0 36.0 - 46.0 %   MCV 94.8 80.0 - 100.0 fL   MCH 32.3 26.0 - 34.0 pg   MCHC 34.0 30.0 - 36.0 g/dL   RDW 13.2 11.5 - 15.5 %   Platelets 146 (L) 150 - 400 K/uL   nRBC 0.0 0.0 - 0.2 %   Neutrophils Relative % 45 %   Neutro Abs 4.8 1.7 - 7.7 K/uL   Lymphocytes Relative 46 %   Lymphs Abs 4.9 (H) 0.7 - 4.0 K/uL   Monocytes Relative 7 %   Monocytes Absolute 0.7 0.1 - 1.0 K/uL   Eosinophils Relative 1 %   Eosinophils Absolute 0.1  0.0 - 0.5 K/uL   Basophils Relative 1 %   Basophils Absolute 0.1 0.0 - 0.1 K/uL   Immature Granulocytes 0 %   Abs Immature Granulocytes 0.04 0.00 - 0.07 K/uL    Comment: Performed at [facility], [address] [place], [place] [address]      PHQ2/9: Depression screen [facility] 2/9 10/23/2020 08/30/2020 06/15/2020 02/29/2020 10/27/2019  Decreased Interest 0 0 0 1 3  Down, Depressed, Hopeless 0 0 0 3 3  PHQ - 2 Score 0 0 0 4 6  Altered sleeping 2 2 - 0 0  Tired, decreased energy 0 3 - 3 3  Change in appetite 0 0 - 3 0  Feeling bad or failure about yourself  0 0 - 1 0  Trouble concentrating 0 0 - 3 0  Moving slowly or fidgety/restless 0 0 - 3 0  Suicidal thoughts 0 0 - 0 0  PHQ-9 Score 2 5 - 17 9  Difficult doing work/chores - Somewhat difficult - Somewhat difficult Not difficult at all  Some recent data might be hidden    phq 9 is negative   Fall Risk: Fall Risk  10/23/2020 08/30/2020 08/07/2020 06/15/2020 02/29/2020  Falls in the past year? 1 1 0 0 0  Number falls in past yr: 1 1 - 0 0  Injury with  Fall? 0 0 - 0 -  Risk for fall due to : - History of fall(s) - - Impaired balance/gait  Follow up - - - Falls evaluation completed -     Functional Status Survey: Is the patient deaf or have difficulty hearing?: No Does the patient have difficulty seeing, even when wearing glasses/contacts?: Yes Does the patient have difficulty concentrating, remembering, or making decisions?: Yes Does the patient have difficulty walking or climbing stairs?: Yes Does the patient have difficulty dressing or bathing?: No Does the patient have difficulty doing errands alone such as visiting a doctor's office or shopping?: No    Assessment & Plan  1. CLL (chronic lymphocytic leukemia) ([place])   2. Anxiety  - Ambulatory referral to Psychiatry  3. Abscess, scalp   Consent signed: YES  Procedure: Incision and drainage of abscess Location: left side scalp towards occipital area  Equipment  used: sterile scalpel, tweezers, curved scissors Anesthesia:  1% Lidocaine w/o Epinephrine  Cleaned and prepped: Betadine  After consent signed, affected area of skin prepped with betadine. Lidocaine w/o epinephrine injected into surround areas and area of intended incisions directly over greatest area of fluctuance of abscess. After properly numbed, 1cm incision with #10 blade made over abscess. Purulent material expressed, abscess cavity and loculations debrided.   Covered area with sterile guaze and taped. Instructed on dressing changes, materials given, pain medications prescribed. F/U for nursing visit for packing change if needed.

## 2020-10-23 NOTE — Telephone Encounter (Signed)
Pt is scheduled to see Dr. Ancil Boozer @ 3:40pm today, 10/23/2020.
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
## 2020-10-25 ENCOUNTER — Other Ambulatory Visit: Payer: Self-pay | Admitting: Obstetrics and Gynecology

## 2020-10-25 ENCOUNTER — Other Ambulatory Visit: Payer: Self-pay

## 2020-10-25 NOTE — Patient Outreach (Signed)
Medicaid Managed Care   Nurse Care Manager Note  10/25/2020 Name:  Monique [name] MRN:  [id number] DOB:  [birth year]  Monique [name] is an 50 y.o. year old female who is a primary patient of [name], MD.  The [facility] Managed Care Coordination team was consulted for assistance with:    chronic healthcare management needs  Monique [name] was given information about Medicaid Managed Care Coordination team services today. Monique [name] agreed to services and verbal consent obtained.  Engaged with patient by telephone for follow up visit in response to provider referral for case management and/or care coordination services.   Assessments/Interventions:  Review of past medical history, allergies, medications, health status, including review of consultants reports, laboratory and other test data, was performed as part of comprehensive evaluation and provision of chronic care management services.  SDOH (Social Determinants of Health) assessments and interventions performed:   Care Plan  No Known Allergies  Medications Reviewed Today    Reviewed by Monique Medicus, RN (Registered Nurse) on 10/25/20 at [id number]  Med List Status: <None>  Medication Order Taking? Sig Documenting Provider Last Dose Status Informant  ADVAIR DISKUS 250-50 MCG/DOSE AEPB [id number] No Inhale 1 puff into the lungs 2 (two) times daily. [name], MD Taking Active   albuterol (VENTOLIN HFA) 108 (90 Base) MCG/ACT inhaler [id number] No Inhale 2 puffs into the lungs every 4 (four) hours as needed for wheezing or shortness of breath. [name], MD Taking Active   amoxicillin-clavulanate (AUGMENTIN) 875-125 MG tablet [id number]  Take 1 tablet by mouth 2 (two) times daily. [name], MD  Active   Ascorbic Acid (VITAMIN C) 1000 MG tablet [id number] No Take 1,000 mg by mouth daily. [provider] Taking Active   atorvastatin (LIPITOR) 10 MG tablet [id number] No Take 1 tablet (10 mg total) by mouth daily.  [name], MD Taking Active   betamethasone dipropionate 0.05 % cream [id number] No APPLY TOPICALLY TWICE DAILY TO AFFECTED AREA AS NEEDED [provider] Taking Active   Calcipotriene-Betameth Diprop (ENSTILAR) 0.005-0.064 % FOAM [id number] No Apply to aa's of the hands and feet BID x 2 weeks. Then decrease use to QD 5d/wk PRN. [name], MD Taking Active   Calcium Carbonate-Vit D-[facility] CALCIUM 1200) 1200-1000 MG-UNIT CHEW [id number] No Chew 1,200 mg by mouth daily with breakfast. Take in combination with vitamin D and magnesium. [name], MD Taking Active   DULoxetine (CYMBALTA) 20 MG capsule [id number] No Take 20 mg by mouth in the morning, at noon, and at bedtime. Anabel Bene, MD Taking Active   ferrous sulfate 324 (65 Fe) MG TBEC [id number] No Take 1 tablet (325 mg total) by mouth in the morning, at noon, and at bedtime.  Patient taking differently: Take 1 tablet by mouth in the morning and at bedtime.   [name], MD Taking Active   HYDROcodone-Acetaminophen 5-300 MG TABS [id number]  Take 1 tablet by mouth 4 (four) times daily as needed for up to 3 days. [name], MD  Active   IMBRUVICA 420 MG TABS [id number] No TAKE 1 TABLET BY MOUTH DAILY. Lloyd Huger, MD Taking Active   lidocaine-prilocaine (EMLA) cream [id number] No Apply 1 application topically as needed. [name], MD Taking Active   loratadine (CLARITIN) 10 MG tablet [id number] No TAKE 1 TABLET BY MOUTH ONCE DAILY IN THE Dyanne Iha, Drue Stager, MD Taking Active   Magnesium 500 MG TABS [id number] No Take by mouth daily. [provider] Taking  Active   meloxicam (MOBIC) 15 MG tablet [id number] No Take 1 tablet (15 mg total) by mouth daily. [name], MD Taking Active   METROCREAM 0.75 % cream [id number] No Apply thin coat to face twice a day as directed. Moye, [place], MD Taking Active   mupirocin ointment (BACTROBAN) 2 % [id number] No APPLY OINTMENT TOPICALLY TWICE  DAILY TO AFFECTED AREA AS NEEDED [provider] Taking Active   omeprazole (PRILOSEC) 20 MG capsule [id number] No Take 1 capsule (20 mg total) by mouth daily. [name], MD Taking Active   Oxymetazoline HCl (RHOFADE) 1 % CREA [id number] No Apply a thin coat to the entire face every morning. [name], MD Taking Active   pregabalin (LYRICA) 100 MG capsule [id number] No Take 100 mg by mouth 3 (three) times daily. [provider] Taking Active   sertraline (ZOLOFT) 100 MG tablet [id number] No TAKE 1 AND 1/2 TABLETS BY MOUTH DAILY Sowles, Drue Stager, MD Taking Active   tiZANidine (ZANAFLEX) 2 MG tablet [id number] No Take 1 tablet (2 mg total) by mouth every 8 (eight) hours as needed for muscle spasms. [name], MD Taking Active   triamcinolone cream (KENALOG) 0.1 % [id number] No Apply 1 application topically 2 (two) times daily. [name], MD Taking Active   vitamin B-12 (CYANOCOBALAMIN) 500 MCG tablet [id number] No Take 500 mcg by mouth daily. [provider] Taking Active   Med List Note [name], RPH-CPP 02/04/19 1518): Ibrutinib filled at [facility] 05-13-18 09-29-18 Received permission from [name] to stop Imbruvica 7 days for procedure.          Patient Active Problem List   Diagnosis Date Noted  . Acute right hip pain 06/05/2020  . 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 Bilateral leg pain 11/03/2019  . Iron deficiency anemia 10/15/2019  . Bilateral arm pain 08/05/2019  . Bilateral hand numbness 08/05/2019  . Weakness of both hands 08/05/2019  . Chronic musculoskeletal pain 07/19/2019  . Chronic neuropathic pain 07/19/2019  . 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 Special screening for malignant neoplasms, colon   . Polyp of sigmoid colon   . Hemorrhoids   . Trigeminal neuralgia 10/05/2018  . Lumbar radiculitis (Right) 09/24/2018  . Hypocalcemia 06/03/2018  . Vitamin D insufficiency  06/03/2018  . Abnormal MRI, lumbar spine (06/02/2020) 06/03/2018  . Chronic hip pain (Right) 06/03/2018  . Spondylosis without myelopathy or radiculopathy, lumbar region 06/03/2018  . DDD (degenerative disc disease), lumbar 06/02/2018  . Lumbar facet hypertrophy 06/02/2018  . Lumbar facet arthropathy 06/02/2018  . Lumbar facet syndrome (Bilateral) (R>L) 06/02/2018  . Marijuana use 05/19/2018  . Chronic lower extremity pain (1ry area of Pain) (Bilateral) (R>L) 05/13/2018  . Chronic low back pain (2ry area of Pain) (Bilateral) (R>L) w/ sciatica (Bilateral) 05/13/2018  . Chronic pain syndrome 05/13/2018  . Opiate use 05/13/2018  . Disorder of skeletal system 05/13/2018  . Pharmacologic therapy 05/13/2018  . Problems influencing health status 05/13/2018  . Mastalgia 05/05/2018  . Breast mass, right 01/07/2018  . Face pain 12/11/2017  . Tingling 12/11/2017  . Thoracic aortic atherosclerosis ([place]) 08/20/2017  . Emphysema of lung ([place]) 08/20/2017  . Adductor tendinitis 04/23/2017  . Trochanteric bursitis of right hip 04/23/2017  . GERD without esophagitis 12/11/2016  . CLL (chronic lymphocytic leukemia) ([place]) 11/24/2016  . Pap smear abnormality of cervix/human papillomavirus (HPV) positive 09/12/2016  . Stress incontinence 09/04/2016  . B12 deficiency 07/10/2015  . Insomnia, persistent 07/10/2015  .  Anorexia nervosa, restricting type 07/10/2015  . Anxiety, generalized 07/10/2015  . H/O suicide attempt 07/10/2015  . Lymphocytosis 07/10/2015  . Obsessive-compulsive disorder 07/10/2015  . Tobacco use 07/10/2015  . History of cervical dysplasia 07/18/2014    Conditions to be addressed/monitored per PCP order:  chronic healthcare management needs, Carpal tunnel syndrome, Cervical cancer Cervical intraepithelial neoplasia I, Chronic lymphocytic leukemia (2018), Depression, Eating disorder, History of self-harm, Insomnia, Obsession, Pap smear abnormality of cervix with LGSIL, Tobacco abuse,  and Vitamin B12 deficiency (non anemic).  Care Plan : Chronic Pain (Adult)  Updates made by Monique Medicus, RN since 10/25/2020 12:00 AM    Problem: Chronic Pain Management (Chronic Pain)     Long-Range Goal: Chronic Pain Managed   Start Date: 08/31/2020  Expected End Date: 11/29/2020  Recent Progress: Not on track  Priority: High  Note:    Long-Range Goal: Chronic Pain Managed      Start Date: 08/31/2020   Expected End Date: 11/29/2020   This Visit's Progress: Not on track   Priority: High   Note:    Current Barriers:   Care Coordination needs related to chronic pain.  Patient needs MRI and epidural injection.  To date, insurance has denied approval.   Nurse Case Manager Clinical Goal(s):   Over the next 30 days, patient will work with provider to address needs related to approval of needed services.  Update 09/25/20:  No approval of MRI and epidural to date by insurance.  Continues to take medications as needed.  Update 10/25/20: Patient states she will call her Provider to follow up on status of MRI and epidural.  Over the next 90 days, patient will attend all scheduled medical appointments:      Interventions:   Inter-disciplinary care team collaboration (see longitudinal plan of care)  Evaluation of current treatment plan related to chronic pain and patient's adherence to plan as established by provider.  Reviewed medications with patient.  Discussed plans with patient for ongoing care management follow up and provided patient with direct contact information for care management team  Reviewed scheduled/upcoming provider appointments.     Patient Goals/Self-Care Activities  Over the next 30 days, patient will:   -Attends all scheduled provider appointments  Calls provider office for new concerns or questions     Follow Up Plan: The Managed Medicaid care management team will reach out to the patient again over the next 30 days.   The patient has been  provided with contact information for the Managed Medicaid care management team and has been advised to call with any health related questions or concerns.   RNCM will follow up with provider regarding patient request for DME, chair that provides massage to legs.  Patient feels like this would help her chronic leg pain.  Update 10/25/20:  Patient has appointment with PCP 10/26/20 and will follow up with her regarding DME.                 Plan: RNCM will follow up with patient within 30 days.  Follow-up:  Patient agrees to Care Plan and Follow-up.    Follow Up:  Patient agrees to Care Plan and Follow-up.  Plan: The Managed Medicaid care management team will reach out to the patient again over the next 30 days. and The patient has been provided with contact information for the Managed Medicaid care management team and has been advised to call with any health related questions or concerns.  Date/time of next scheduled RN  care management/care coordination outreach:  11/22/20 at 0900.

## 2020-10-25 NOTE — Patient Instructions (Signed)
Hi Ms. Bayon, thank you for speaking with me today.  Ms. Cadmus was given information about Medicaid Managed Care team care coordination services as a part of their [facility] Medicaid benefit. Khalil Belote verbally consented to engagement with the [facility] Managed Care team.   For questions related to your [facility] health plan, please call: [phone]  If you would like to schedule transportation through your [facility] plan, please call the following number at least 2 days in advance of your appointment: [phone]  Ms. Heitzenrater - following are the goals we discussed in your visit today:  Goals Addressed            This Visit's Progress   . Cope with Chronic Pain       Timeframe:  Long-Range Goal Priority:  High Start Date:     08/31/20                        Expected End Date:     11/29/20                  Follow Up Date 11/22/20   - practice relaxation or meditation daily - use distraction techniques - use relaxation during pain  Will follow up regarding MRI and epidural.         Patient verbalizes understanding of instructions provided today.   The Managed Medicaid care management team will reach out to the patient again over the next 30 days.  The patient has been provided with contact information for the Managed Medicaid care management team and has been advised to call with any health related questions or concerns.   Aida Raider RN, BSN [place]  [facility] - Managed Medicaid High Risk [phone].  Following is a copy of your plan of care:  Patient Care Plan: Chronic Pain (Adult)    Problem Identified: Chronic Pain Management (Chronic Pain)     Long-Range Goal: Chronic Pain Managed   Start Date: 08/31/2020  Expected End Date: 11/29/2020  Recent Progress: Not on track  Priority: High  Note:    Long-Range Goal: Chronic Pain Managed      Start Date: 08/31/2020   Expected End Date: 11/29/2020    This Visit's Progress: Not on track   Priority: High   Note:    Current Barriers:   Care Coordination needs related to chronic pain.  Patient needs MRI and epidural injection.  To date, insurance has denied approval.   Nurse Case Manager Clinical Goal(s):   Over the next 30 days, patient will work with provider to address needs related to approval of needed services.  Update 09/25/20:  No approval of MRI and epidural to date by insurance.  Continues to take medications as needed.  Update 10/25/20: Patient states she will call her Provider to follow up on status of MRI and epidural.  Over the next 90 days, patient will attend all scheduled medical appointments:      Interventions:   Inter-disciplinary care team collaboration (see longitudinal plan of care)  Evaluation of current treatment plan related to chronic pain and patient's adherence to plan as established by provider.  Reviewed medications with patient.  Discussed plans with patient for ongoing care management follow up and provided patient with direct contact information for care management team  Reviewed scheduled/upcoming provider appointments.     Patient Goals/Self-Care Activities  Over the next 30 days, patient will:   -Attends all scheduled provider  appointments  Calls provider office for new concerns or questions     Follow Up Plan: The Managed Medicaid care management team will reach out to the patient again over the next 30 days.   The patient has been provided with contact information for the Managed Medicaid care management team and has been advised to call with any health related questions or concerns.   RNCM will follow up with provider regarding patient request for DME, chair that provides massage to legs.  Patient feels like this would help her chronic leg pain.  Update 10/25/20:  Patient has appointment with PCP 10/26/20 and will follow up with her regarding DME.                  Plan: RNCM will follow up with patient within 30 days.  Follow-up:  Patient agrees to Care Plan and Follow-up.

## 2020-10-26 ENCOUNTER — Other Ambulatory Visit (HOSPITAL_COMMUNITY)
Admission: RE | Admit: 2020-10-26 | Discharge: 2020-10-26 | Disposition: A | Discharge status: Home / Self Care | Payer: Medicaid Other | Source: Ambulatory Visit | Attending: Family Medicine | Admitting: Family Medicine

## 2020-10-26 ENCOUNTER — Encounter: Payer: Self-pay | Admitting: Family Medicine

## 2020-10-26 ENCOUNTER — Other Ambulatory Visit: Payer: Self-pay

## 2020-10-26 ENCOUNTER — Other Ambulatory Visit: Payer: Self-pay | Admitting: Family Medicine

## 2020-10-26 ENCOUNTER — Ambulatory Visit: Payer: Medicaid Other | Admitting: Family Medicine

## 2020-10-26 VITALS — BP 118/78 | HR 79 | Temp 98.0°F | Resp 16 | Ht 67.0 in | Wt 170.0 lb

## 2020-10-26 DIAGNOSIS — Z23 Encounter for immunization: Secondary | ICD-10-CM | POA: Diagnosis not present

## 2020-10-26 DIAGNOSIS — Z0000 Encounter for general adult medical examination without abnormal findings: Secondary | ICD-10-CM | POA: Diagnosis not present

## 2020-10-26 DIAGNOSIS — Z22322 Carrier or suspected carrier of Methicillin resistant Staphylococcus aureus: Secondary | ICD-10-CM

## 2020-10-26 DIAGNOSIS — N393 Stress incontinence (female) (male): Secondary | ICD-10-CM

## 2020-10-26 DIAGNOSIS — R87618 Other abnormal cytological findings on specimens from cervix uteri: Secondary | ICD-10-CM | POA: Insufficient documentation

## 2020-10-26 DIAGNOSIS — Z1231 Encounter for screening mammogram for malignant neoplasm of breast: Secondary | ICD-10-CM

## 2020-10-26 MED ORDER — MUPIROCIN CALCIUM 2 % EX CREA: 1.0000 "application " | TOPICAL_CREAM | Freq: Two times a day (BID) | CUTANEOUS | 0 refills | Status: DC

## 2020-10-26 MED ORDER — MUPIROCIN 2 % EX OINT: 1.0000 "application " | TOPICAL_OINTMENT | Freq: Two times a day (BID) | CUTANEOUS | 0 refills | Status: DC

## 2020-10-26 MED ORDER — SULFAMETHOXAZOLE-TRIMETHOPRIM 800-160 MG PO TABS: 1.0000 | ORAL_TABLET | Freq: Two times a day (BID) | ORAL | 0 refills | Status: DC

## 2020-10-26 NOTE — Patient Instructions (Signed)
MRSA Infection, Diagnosis, Adult Methicillin-resistant Staphylococcus aureus (MRSA) infection is caused by bacteria called Staphylococcus aureus, or staph, that no longer respond to common antibiotic medicines (drug-resistant bacteria). MRSA infection can be hard to treat. Most of the time, MRSA can be on the skin or in the nose without causing problems (colonized). However, if MRSA enters the body through a cut, a sore, or an invasive medical device, it can cause a serious infection. What are the causes? This condition is caused by staph bacteria. Illness may develop after exposure to the bacteria through:  Skin-to-skin contact with someone who is infected with MRSA.  Touching surfaces that have the bacteria on them.  Having a procedure or using equipment that allows MRSA to enter the body.  Having MRSA that lives on your skin and then enters your body through: ? A cut or scratch. ? A surgery or procedure. ? The use of a medical device. Contact with the bacteria may occur:  During a stay in a hospital, rehabilitation facility, nursing home, or other health care facility (health care-associated MRSA).  In daily activities where there is close contact with others, such as sports, child care centers, or at home (community-associated MRSA). What increases the risk? You are more likely to develop this condition if you:  Have a surgery or procedure.  Have an IV or a thin tube (catheter) placed in your body.  Are elderly.  Are on kidney dialysis.  Have recently taken an antibiotic medicine.  Live in a long-term care facility.  Have a chronic wound or skin ulcer.  Have a weak body defense system (immune system).  Play sports that involve skin-to-skin contact.  Live in a crowded place, like a dormitory or [employer]  Share towels, razors, or sports equipment with other people.  Have a history of MRSA infection or colonization. What are the signs or symptoms? Symptoms  of this condition depend on the area that is affected. Symptoms may include:  A pus-filled pimple or boil.  Pus that drains from your skin.  A sore (abscess) under your skin or somewhere in your body.  Fever with or without chills.  Difficulty breathing.  Coughing up blood.  Redness, warmth, swelling, or pain in the affected area. How is this diagnosed? This condition may be diagnosed based on:  A physical exam.  Your medical history.  Taking a sample from the infected area and growing it in a lab (culture). You may also have other tests, including:  Imaging tests, such as X-rays, a CT scan, or an MRI.  Lab tests, such as blood, urine, or phlegm (sputum) tests. You skin or nose may be swabbed when you are admitted to a health care facility for a procedure. This is to screen for MRSA. How is this treated? Treatment depends on the type of MRSA infection you have and how severe, deep, or extensive it is. Treatment may include:  Antibiotic medicines.  Surgery to drain pus from the infected area. Severe infections may require a hospital stay. Follow these instructions at home: Medicines  Take over-the-counter and prescription medicines only as told by your health care provider.  If you were prescribed an antibiotic medicine, use it as told by your health care provider. Do not stop using the antibiotic even if you start to feel better. Prevention Follow these instructions to avoid spreading the infection to others:  Wash your hands frequently with soap and water. If soap and water are not available, use an alcohol-based hand sanitizer.  Avoid close contact with those around you as much as possible. Do not use towels, razors, toothbrushes, bedding, or other items that will be used by others.  Wash towels, bedding, and clothes in the washing machine with detergent and hot water. Dry them in a hot dryer.  Clean surfaces regularly to remove germs (disinfection). Use products  or solutions that contain bleach. Make sure you disinfect bathroom surfaces, food preparation areas, exercise equipment, and doorknobs.   General instructions  If you have a wound, follow instructions from your health care provider about how to take care of your wound. ? Do not pick at scabs. ? Do not try to drain any infection sites or pimples.  Tell all your health care providers that you have MRSA, or if you have ever had a MRSA infection.  Keep all follow-up visits as told by your health care provider. This is important. Contact a health care provider if you:  Do not get better.  Have symptoms that get worse.  Have new symptoms. Get help right away if you have:  Nausea or vomiting, or if you cannot take medicine without vomiting.  Trouble breathing.  Chest pain. These symptoms may represent a serious problem that is an emergency. Do not wait to see if the symptoms will go away. Get medical help right away. Call your local emergency services (911 in the [place] Do not drive yourself to the hospital. Summary  MRSA infection is caused by bacteria called Staphylococcus aureus, or staph, that no longer respond to common antibiotic medicines.  Treatment for this condition depends on the type of MRSA infection you have and how severe, deep, and extensive it is.  If you were prescribed an antibiotic medicine, use it as told by your health care provider. Do not stop using the antibiotic even if you start to feel better.  Follow instructions from your health care provider to avoid spreading the infection to others. This information is not intended to replace advice given to you by your health care provider. Make sure you discuss any questions you have with your health care provider. Document Revised: 11/19/2018 Document Reviewed: 11/20/2018 Elsevier Patient Education  2021 [employer] Inc[employer] Preventive Care 94-29 Years Old, Female Preventive care refers to lifestyle choices and visits with  your health care provider that can promote health and wellness. This includes:  A yearly physical exam. This is also called an annual wellness visit.  Regular dental and eye exams.  Immunizations.  Screening for certain conditions.  Healthy lifestyle choices, such as: ? Eating a healthy diet. ? Getting regular exercise. ? Not using drugs or products that contain nicotine and tobacco. ? Limiting alcohol use. What can I expect for my preventive care visit? Physical exam Your health care provider will check your:  Height and weight. These may be used to calculate your BMI (body mass index). BMI is a measurement that tells if you are at a healthy weight.  Heart rate and blood pressure.  Body temperature.  Skin for abnormal spots. Counseling Your health care provider may ask you questions about your:  Past medical problems.  Family's medical history.  Alcohol, tobacco, and drug use.  Emotional well-being.  Home life and relationship well-being.  Sexual activity.  Diet, exercise, and sleep habits.  Work and work Statistician.  Access to firearms.  Method of birth control.  Menstrual cycle.  Pregnancy history. What immunizations do I need? Vaccines are usually given at various ages, according to a schedule. Your health care  provider will recommend vaccines for you based on your age, medical history, and lifestyle or other factors, such as travel or where you work.   What tests do I need? Blood tests  Lipid and cholesterol levels. These may be checked every 5 years, or more often if you are over 36 years old.  Hepatitis C test.  Hepatitis B test. Screening  Lung cancer screening. You may have this screening every year starting at age 28 if you have a 30-pack-year history of smoking and currently smoke or have quit within the past 15 years.  Colorectal cancer screening. ? All adults should have this screening starting at age 26 and continuing until age  81. ? Your health care provider may recommend screening at age 65 if you are at increased risk. ? You will have tests every 1-10 years, depending on your results and the type of screening test.  Diabetes screening. ? This is done by checking your blood sugar (glucose) after you have not eaten for a while (fasting). ? You may have this done every 1-3 years.  Mammogram. ? This may be done every 1-2 years. ? Talk with your health care provider about when you should start having regular mammograms. This may depend on whether you have a family history of breast cancer.  BRCA-related cancer screening. This may be done if you have a family history of breast, ovarian, tubal, or peritoneal cancers.  Pelvic exam and Pap test. ? This may be done every 3 years starting at age 98. ? Starting at age 41, this may be done every 5 years if you have a Pap test in combination with an HPV test. Other tests  STD (sexually transmitted disease) testing, if you are at risk.  Bone density scan. This is done to screen for osteoporosis. You may have this scan if you are at high risk for osteoporosis. Talk with your health care provider about your test results, treatment options, and if necessary, the need for more tests. Follow these instructions at home: Eating and drinking  Eat a diet that includes fresh fruits and vegetables, whole grains, lean protein, and low-fat dairy products.  Take vitamin and mineral supplements as recommended by your health care provider.  Do not drink alcohol if: ? Your health care provider tells you not to drink. ? You are pregnant, may be pregnant, or are planning to become pregnant.  If you drink alcohol: ? Limit how much you have to 0-1 drink a day. ? Be aware of how much alcohol is in your drink. In the [place] one drink equals one 12 oz bottle of beer (355 mL), one 5 oz glass of wine (148 mL), or one 1 oz glass of hard liquor (44 mL).   Lifestyle  Take daily care of your  teeth and gums. Brush your teeth every morning and night with fluoride toothpaste. Floss one time each day.  Stay active. Exercise for at least 30 minutes 5 or more days each week.  Do not use any products that contain nicotine or tobacco, such as cigarettes, e-cigarettes, and chewing tobacco. If you need help quitting, ask your health care provider.  Do not use drugs.  If you are sexually active, practice safe sex. Use a condom or other form of protection to prevent STIs (sexually transmitted infections).  If you do not wish to become pregnant, use a form of birth control. If you plan to become pregnant, see your health care provider for a prepregnancy visit.  If told by your health care provider, take low-dose aspirin daily starting at age 61.  Find healthy ways to cope with stress, such as: ? Meditation, yoga, or listening to music. ? Journaling. ? Talking to a trusted person. ? Spending time with friends and family. Safety  Always wear your seat belt while driving or riding in a vehicle.  Do not drive: ? If you have been drinking alcohol. Do not ride with someone who has been drinking. ? When you are tired or distracted. ? While texting.  Wear a helmet and other protective equipment during sports activities.  If you have firearms in your house, make sure you follow all gun safety procedures. What's next?  Visit your health care provider once a year for an annual wellness visit.  Ask your health care provider how often you should have your eyes and teeth checked.  Stay up to date on all vaccines. This information is not intended to replace advice given to you by your health care provider. Make sure you discuss any questions you have with your health care provider. Document Revised: 06/06/2020 Document Reviewed: 05/14/2018 Elsevier Patient Education  2021 [employer].

## 2020-10-26 NOTE — Telephone Encounter (Signed)
   Notes to clinic:  Alternative Requested:NOT COVERED BY INSURANCE   Requested Prescriptions  Pending Prescriptions Disp Refills   mupirocin ointment (BACTROBAN) 2 % [Pharmacy Med Name: MUPIROCIN 2% OINTMENT]  0      Off-Protocol Failed - 10/26/2020 11:05 AM      Failed - Medication not assigned to a protocol, review manually.      Passed - Valid encounter within last 12 months    Recent Outpatient Visits           Today Well adult exam   [facility] [name], MD   3 days ago [facility] [name], MD   1 month ago Centrilobular emphysema [facility])   [facility] [name], MD   4 months ago Rash and nonspecific skin eruption   [facility] [place], [place], PA-C   8 months ago Centrilobular emphysema [facility])   [facility] [name], MD       Future Appointments             In 1 month [name], MD [facility], [place]   In 1 month [name], MD [place]

## 2020-10-27 ENCOUNTER — Other Ambulatory Visit: Payer: Self-pay | Admitting: Family Medicine

## 2020-10-27 DIAGNOSIS — E611 Iron deficiency: Secondary | ICD-10-CM

## 2020-10-27 LAB — CYTOLOGY - PAP
Comment: NEGATIVE
High risk HPV: POSITIVE — AB

## 2020-10-27 NOTE — Telephone Encounter (Signed)
Requested medication (s) are due for refill today:   Yes  Requested medication (s) are on the active medication list:   Yes  Future visit scheduled:   Yes     Was seen yesterday by Dr. Ancil Boozer   Last ordered: 08/30/2020 #270, 0 refills  Clinic note:   Returned for dose clarification.   Order is for 3 times a day but it's noted pt is taking it twice a day.       Requested Prescriptions  Pending Prescriptions Disp Refills   ferrous sulfate 324 (65 Fe) MG TBEC [Pharmacy Med Name: FERROUS SULF EC 324 MG TABLET] 90 tablet 2    Sig: TAKE 1 TABLET (325 MG TOTAL) BY MOUTH IN THE MORNING, AT NOON, AND AT BEDTIME.      Endocrinology:  Minerals - Iron Supplementation Failed - 10/27/2020  8:31 AM      Failed - Fe (serum) in normal range and within 360 days    No results found for: IRON, IRONPCTSAT        Failed - Ferritin in normal range and within 360 days    No results found for: FERRITIN        Passed - HGB in normal range and within 360 days    Hemoglobin  Date Value Ref Range Status  09/28/2020 14.3 12.0 - 15.0 g/dL Final          Passed - HCT in normal range and within 360 days    HCT  Date Value Ref Range Status  09/28/2020 42.0 36.0 - 46.0 % Final          Passed - RBC in normal range and within 360 days    RBC  Date Value Ref Range Status  09/28/2020 4.43 3.87 - 5.11 MIL/uL Final          Passed - Valid encounter within last 12 months    Recent Outpatient Visits           Yesterday Well adult exam   [facility] [name], MD   4 days ago [facility] [name], MD   1 month ago Centrilobular emphysema [facility])   [facility] [name], MD   4 months ago Rash and nonspecific skin eruption   [facility] [place], [place], PA-C   8 months ago Centrilobular emphysema [facility])   [facility] [name], MD       Future Appointments              In 1 month [name], MD [facility], [place]   In 1 month [name], MD [place]   In 1 year [name], MD [place], [facility]

## 2020-10-29 LAB — ANAEROBIC AND AEROBIC CULTURE
MICRO NUMBER:: 11506449
MICRO NUMBER:: 11506450
SPECIMEN QUALITY:: ADEQUATE
SPECIMEN QUALITY:: ADEQUATE

## 2020-10-31 ENCOUNTER — Telehealth: Payer: Self-pay

## 2020-10-31 NOTE — Telephone Encounter (Signed)
Pt called no answer LM via Vm that I was calling to see if she could come in tomorrow at 10:30am instead of 10:45am. Pt was advised to please contact the office to let us know if she was able to do that appointment changed. If not we would have to reschedule her for a colpo with a 30 minute slot instead of a 15 min slot.

## 2020-11-01 ENCOUNTER — Other Ambulatory Visit (HOSPITAL_COMMUNITY)
Admission: RE | Admit: 2020-11-01 | Discharge: 2020-11-01 | Disposition: A | Discharge status: Home / Self Care | Payer: Medicaid Other | Source: Ambulatory Visit | Attending: Obstetrics and Gynecology | Admitting: Obstetrics and Gynecology

## 2020-11-01 ENCOUNTER — Other Ambulatory Visit: Payer: Self-pay

## 2020-11-01 ENCOUNTER — Ambulatory Visit (INDEPENDENT_AMBULATORY_CARE_PROVIDER_SITE_OTHER): Payer: Medicaid Other | Admitting: Obstetrics and Gynecology

## 2020-11-01 ENCOUNTER — Encounter: Payer: Self-pay | Admitting: Obstetrics and Gynecology

## 2020-11-01 VITALS — BP 96/62 | HR 81 | Ht 67.0 in | Wt 172.3 lb

## 2020-11-01 DIAGNOSIS — R87612 Low grade squamous intraepithelial lesion on cytologic smear of cervix (LGSIL): Secondary | ICD-10-CM

## 2020-11-01 DIAGNOSIS — N871 Moderate cervical dysplasia: Secondary | ICD-10-CM | POA: Diagnosis not present

## 2020-11-01 NOTE — Progress Notes (Signed)
    GYNECOLOGY OFFICE COLPOSCOPY PROCEDURE NOTE  50 y.o. [id number] here for colposcopy for low-grade squamous intraepithelial neoplasia (LGSIL - encompassing HPV,mild dysplasia,CIN I) pap smear on 10/26/2020. 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 She has a history of abnormal pap smears, also with remote h/o LEEP ~ 20+ years ago.  Discussed role for HPV in cervical dysplasia, need for surveillance. Also discussed risk factors associated with abnormal pap smears and HPV.   Patient gave informed written consent, time out was performed.  Placed in lithotomy position. Cervix viewed with speculum and colposcope after application of acetic acid.   Colposcopy adequate? Yes  acetowhite lesion(s) noted at 12 o'clock; corresponding biopsies obtained.  ECC specimen obtained. All specimens were labeled and sent to pathology.  Patient was given post procedure instructions.  Will follow up pathology and manage accordingly; patient will be contacted with results and recommendations.  Will likely need to have another LEEP procedure based on findings today. Routine preventative health maintenance measures emphasized.   Rubie Maid, MD [facility]

## 2020-11-01 NOTE — Patient Instructions (Signed)
[email or web address]  Colposcopy, Care After This sheet gives you information about how to care for yourself after your procedure. Your health care provider may also give you more specific instructions. If you have problems or questions, contact your health care provider. What can I expect after the procedure? If you had a colposcopy without a biopsy, you can expect to feel fine right away after your procedure. However, you may have some spotting of blood for a few days. You can return to your normal activities. If you had a colposcopy with a biopsy, it is common after the procedure to have:  Soreness and mild pain. These may last for a few days.  Light-headedness.  Mild vaginal bleeding or discharge that is dark-colored and grainy. This may last for a few days. The discharge may be caused by a liquid (solution) that was used during the procedure. You may need to wear a sanitary pad during this time.  Spotting of blood for at least 48 hours after the procedure. Follow these instructions at home: Medicines  Take over-the-counter and prescription medicines only as told by your health care provider.  Talk with your health care provider about what type of over-the-counter pain medicine and prescription medicine you can start to take again. It is especially important to talk with your health care provider if you take blood thinners. Activity  Limit your physical activity for the first day after your procedure as told by your health care provider.  Avoid using douche products, using tampons, or having sex for at least 3 days after the procedure or for as long as told.  Return to your normal activities as told by your health care provider. Ask your health care provider what activities are safe for you. General instructions  Drink enough fluid to keep your urine pale yellow.  Ask your health care provider if you may take baths, swim, or use a hot tub. You may take  showers.  If you use birth control (contraception), continue to use it.  Keep all follow-up visits as told by your health care provider. This is important.   Contact a health care provider if:  You develop a skin rash. Get help right away if:  You bleed a lot from your vagina or pass blood clots. This includes using more than one sanitary pad each hour for 2 hours in a row.  You have a fever or chills.  You have vaginal discharge that is abnormal, is yellow in color, or smells bad. This could be a sign of infection.  You have severe pain or cramps in your lower abdomen that do not go away with medicine.  You faint. Summary  If you had a colposcopy without a biopsy, you can expect to feel fine right away, but you may have some spotting of blood for a few days. You can return to your normal activities.  If you had a colposcopy with a biopsy, it is common to have mild pain for a few days and spotting for 48 hours after the procedure.  Avoid using douche products, using tampons, and having sex for at least 3 days after the procedure or for as long as told by your health care provider.  Get help right away if you have heavy bleeding, severe pain, or signs of infection. This information is not intended to replace advice given to you by your health care provider. Make sure you discuss any questions you have with your health care provider. Document Revised: 09/01/2019 Document Reviewed:   09/01/2019 Elsevier Patient Education  2021 Elsevier Inc.  

## 2020-11-01 NOTE — Progress Notes (Signed)
Pt present for colpo. Pt stated that she was doing well no problems.

## 2020-11-02 LAB — SURGICAL PATHOLOGY

## 2020-11-02 NOTE — Progress Notes (Signed)
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 Will need repeat pap smear in 1 year.

## 2020-11-14 ENCOUNTER — Other Ambulatory Visit: Payer: Self-pay | Admitting: Pain Medicine

## 2020-11-14 DIAGNOSIS — M7918 Myalgia, other site: Secondary | ICD-10-CM

## 2020-11-14 DIAGNOSIS — G8929 Other chronic pain: Secondary | ICD-10-CM

## 2020-11-14 DIAGNOSIS — M5442 Lumbago with sciatica, left side: Secondary | ICD-10-CM

## 2020-11-15 MED FILL — IMBRUVICA 420 MG TAB: 420 | 28 days supply | Qty: 28 | Fill #1

## 2020-11-22 ENCOUNTER — Other Ambulatory Visit: Payer: Self-pay

## 2020-11-22 ENCOUNTER — Other Ambulatory Visit: Payer: Self-pay | Admitting: Obstetrics and Gynecology

## 2020-11-22 NOTE — Patient Outreach (Signed)
Care Coordination  11/22/2020  Monique [name] [birth year] [phone]   RNCM called patient at scheduled time.  Patient stated she was having trouble "focusing" and asked to talk at another time.  RNCM will reschedule appointment at patient's request.  Aida Raider RN, BSN [place] Management Coordinator - Managed [place] [facility] [phone].
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
## 2020-11-28 ENCOUNTER — Ambulatory Visit: Payer: Medicaid Other | Admitting: Family Medicine

## 2020-11-30 ENCOUNTER — Telehealth: Payer: Self-pay | Admitting: Obstetrics and Gynecology

## 2020-11-30 NOTE — Telephone Encounter (Signed)
New message    Seen Dr. Marcelline Mates on  2.16.22  Checking on the status of Leep procedure or Hysterectomy   Please advise

## 2020-12-01 NOTE — Telephone Encounter (Signed)
Attempted to contact the pt several times using number on file. Each time the number was did not dial or ring just air.  Sent pt a Therapist, music.

## 2020-12-04 ENCOUNTER — Ambulatory Visit (INDEPENDENT_AMBULATORY_CARE_PROVIDER_SITE_OTHER): Payer: Medicaid Other | Admitting: Dermatology

## 2020-12-04 ENCOUNTER — Ambulatory Visit: Payer: Medicaid Other | Admitting: Dermatology

## 2020-12-04 ENCOUNTER — Other Ambulatory Visit: Payer: Self-pay

## 2020-12-04 DIAGNOSIS — L409 Psoriasis, unspecified: Secondary | ICD-10-CM

## 2020-12-04 DIAGNOSIS — L719 Rosacea, unspecified: Secondary | ICD-10-CM

## 2020-12-04 DIAGNOSIS — L209 Atopic dermatitis, unspecified: Secondary | ICD-10-CM | POA: Diagnosis not present

## 2020-12-04 MED ORDER — EUCRISA 2 % EX OINT: 1.0000 "application " | TOPICAL_OINTMENT | Freq: Two times a day (BID) | CUTANEOUS | 3 refills | Status: DC
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
## 2020-12-04 NOTE — Progress Notes (Signed)
   Follow-Up Visit   Subjective  Monique [name] is a 50 y.o. female who presents for the following: Follow-up (Psoriasis vs eczema of hands and feet - Monique [name] is not helping) and Rosacea (Follow up - treating with Rhofade which is working very well.).  The following portions of the chart were reviewed this encounter and updated as appropriate:   Tobacco  Allergies  Meds  Problems  Med Hx  Surg Hx  Fam Hx     Review of Systems:  No other skin or systemic complaints except as noted in HPI or Assessment and Plan.  Objective  Well appearing patient in no apparent distress; mood and affect are within normal limits.  A focused examination was performed including face, hands, feet. Relevant physical exam findings are noted in the Assessment and Plan.  Objective  Left Lateral Heel: Fissure and scaling of bilateral soles. Pinkness and scaling of palms.  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  Continue Enstilar qhs to palms until finished with current prescription - advised patient not to refill.  Start Eucrisa oint bid to feet and hands - will consider Opzelura if Monique [name] is not covered.  Psoriasis is a chronic non-curable, but treatable genetic/hereditary disease that may have other systemic features affecting other organ systems such as joints (Psoriatic Arthritis). It is associated with an increased risk of inflammatory bowel disease, heart disease, non-alcoholic fatty liver disease, and depression.    Atopic dermatitis (eczema) is a chronic, relapsing, pruritic condition that can significantly affect quality of life. It is often associated with allergic rhinitis and/or asthma and can require treatment with topical medications, phototherapy, or in severe cases a biologic medication called Dupixent in older children and adults.    Crisaborole (EUCRISA) 2 % OINT - Left Lateral Heel  Other Related Medications Calcipotriene-Betameth Diprop (ENSTILAR) 0.005-0.064 % FOAM  Rosacea Head - Anterior (Face)  Rosacea is a chronic progressive skin condition usually affecting the face of adults, causing redness and/or acne bumps. It is treatable but not curable. It sometimes affects the eyes (ocular rosacea) as well. It may respond to topical and/or systemic medication and can flare with stress, sun exposure, alcohol, exercise and some foods.  Daily application of broad spectrum spf 30+ sunscreen to face is recommended to reduce flares.  Continue Rhofade qd  Discussed BBL/laser  Other Related Medications Oxymetazoline HCl (RHOFADE) 1 % CREA  Return in about 6 weeks (around 01/15/2021) for Follow up.   I, Ashok Cordia, CMA, am acting as scribe for [name], MD .  Documentation: I have reviewed the above documentation for accuracy and completeness, and I agree with the above.  [name], MD

## 2020-12-05 ENCOUNTER — Telehealth: Payer: Self-pay | Admitting: *Deleted

## 2020-12-05 ENCOUNTER — Encounter: Payer: Self-pay | Admitting: Dermatology

## 2020-12-05 ENCOUNTER — Other Ambulatory Visit: Payer: Self-pay | Admitting: Obstetrics and Gynecology

## 2020-12-05 DIAGNOSIS — G8929 Other chronic pain: Secondary | ICD-10-CM

## 2020-12-05 DIAGNOSIS — M5442 Lumbago with sciatica, left side: Secondary | ICD-10-CM

## 2020-12-05 NOTE — Patient Instructions (Signed)
Hi Ms. Lennox, thank you for speaking with me.  I will make the referrals we talked about.  Have a nice day!  Ms. Lariccia was given information about Medicaid Managed Care team care coordination services as a part of their [facility] Medicaid benefit. Haizley Cannella verbally consented to engagement with the [facility] Managed Care team.   For questions related to your [facility] health plan, please call: [phone]  If you would like to schedule transportation through your [facility] plan, please call the following number at least 2 days in advance of your appointment: [phone]   Call the [facility] at [phone], at any time, 24 hours a day, 7 days a week. If you are in danger or need immediate medical attention call 911.  Ms. Lichtenberger - following are the goals we discussed in your visit today:  Goals Addressed            This Visit's Progress   . Cope with Chronic Pain       Timeframe:  Long-Range Goal Priority:  High Start Date:     08/31/20                        Expected End Date:     03/07/21               Follow Up Date: 01/05/21   - practice relaxation or meditation daily - use distraction techniques - use relaxation during pain  Will follow up regarding MRI and epidural.   Update 12/05/20:  Patient has not heard back from Dr. Adalberto Cole office regarding approval for MRI and epidural-patient will call office.  Also, patient is out of [place] trouble getting refilled. Pharmacy referral for patient to assist with medications.  Needs DME to assist with burning in her legs-will check with [facility] to see what they will cover.       Patient verbalizes understanding of instructions provided today.   The Managed Medicaid care management team will reach out to the patient again over the next 30 days.  The patient has been provided with contact information for the Managed Medicaid care management team and has been advised to call with any health  related questions or concerns.  Aida Raider RN, BSN [place]  [facility]ator - Managed Medicaid High Risk [phone].   Following is a copy of your plan of care:  Patient Care Plan: Chronic Pain (Adult)    Problem Identified: Chronic Pain Management (Chronic Pain)     Long-Range Goal: Chronic Pain Managed   Start Date: 08/31/2020  Expected End Date: 03/07/2021  Recent Progress: Not on track  Priority: High  Note:    Long-Range Goal: Chronic Pain Managed      Start Date: 08/31/2020   Expected End Date: 03/07/21   This Visit's Progress: Not on track   Priority: High   Note:    Current Barriers:   Care Coordination needs related to chronic pain.  Patient needs MRI and epidural injection.  To date, insurance has denied approval.  Patient is out of [place] trouble getting refilled.   Nurse Case Manager Clinical Goal(s):   Over the next 30 days, patient will work with provider to address needs related to approval of needed services.  Update 09/25/20:  No approval of MRI and epidural to date by insurance.  Continues to take medications as needed.  Update 10/25/20: Patient states she will call her Provider to  follow up on status of MRI and epidural.  Update 12/05/20:  No updates on MRI or epidural -patient has not heard anything from Dr. Adalberto Cole office and states she will call for an update as it has been since November that she has heard anything.  Over the next 90 days, patient will attend all scheduled medical appointments:     Interventions:   Inter-disciplinary care team collaboration (see longitudinal plan of care)  Evaluation of current treatment plan related to chronic pain and patient's adherence to plan as established by provider.  Reviewed medications with patient.  Discussed plans with patient for ongoing care management follow up and provided patient with direct contact information for care management  team  Reviewed scheduled/upcoming provider appointments.  Pharmacy referral to review medications.  Social Work referral for anxiety/depression, h/o eating disorder and self harm.  Care Guide referral for DME  resources for patient in which [facility] will help pay for.     Patient Goals/Self-Care Activities  Over the next 30 days, patient will:   -Attends all scheduled provider appointments  Calls provider office for new concerns or questions.  Follow up with Dr. Dossie Arbour regarding MRI/epidural approval.     Follow Up Plan: The Managed Medicaid care management team will reach out to the patient again over the next 30 days.   The patient has been provided with contact information for the Managed Medicaid care management team and has been advised to call with any health related questions or concerns.   RNCM will follow up with provider regarding patient request for DME, chair that provides massage to legs.  Patient feels like this would help her chronic leg pain.  Update 10/25/20:  Patient has appointment with PCP 10/26/20 and will follow up with her regarding DME.                 Plan: RNCM will follow up with patient within 30 days.  Follow-up:  Patient agrees to Care Plan and Follow-up.   Patient Care Plan: Chronic Pain (Adult)    Problem Identified: Pain Management Plan (Chronic Pain)     Long-Range Goal: Pain Management Plan Developed   Start Date: 08/31/2020  Expected End Date: 03/07/2021  This Visit's Progress: Not on track  Priority: High  Note:   Evidence-based guidance:   Acknowledge patient as the expert in pain self-management.   Partner to set a comfort goal that allows for return to work, school, usual activity and acceptable quality of life.   Assess pain level, usual behavior with and without pain and symptoms that commonly occur with chronic pain, such as fatigue, sleep problems, cognitive and mood disturbance; use a consistent pain scale.    Determine if pain is associated with mobility or at rest, location, intensity, frequency, duration, recurrence, pattern, triggers, relieving factors and description, such as cramping, burning or aching.    Mutually develop an interdisciplinary, multi-modal and detailed pain management plan with patient and family or caregiver; track the patient's response to pain management and adjust plan as needed.       Problem Identified: Harm or Injury (Chronic Pain)     Long-Range Goal: Harm or Injury Prevented   Start Date: 08/31/2020  Expected End Date: 03/07/2021  This Visit's Progress: On track  Priority: High  Note:   Evidence-based guidance:   Address risk for personal injury, such as falls, motor vehicle accidents, self-harm due to medication side effects, compromised mobility or diminished perception, reasoning, decision-making and judgment.  Engage family in providing a safe home environment and closely monitoring changes in the patient's level of sedation and emotional state, such as negativity, hopelessness and suicidal ideation.   Explore suicidal tendencies compassionately, yet directly, by asking about suicidal ideation, attempt history and family history.   Maintain frequent, structured and supportive contact, such as by phone, office or home visit; collaborate closely with behavioral health specialists or psychiatry.   Make immediate arrangements for evaluation at emergency department, community mental health agency or other psychiatric service when patient expresses positive suicidal ideation with a plan and access to lethal means.   Promote fall risk prevention by making home and environmental adjustments; provide clear instructions regarding ability to drive.   Assess for opioid-induced constipation; provide anticipatory guidance regarding prevention when beginning opioid use by using stool softener or laxative, as well as increasing fluids and dietary fiber.   When  opioid-induced constipation is noted, optimize lifestyle interventions and anticipate the use of opioid antagonist as well as tapering, discontinuation or change of opioid.   Encourage frequent oral hygiene (brushing and rinsing), the use of xylitol-containing gum as well as sugar-free and decaffeinated beverages when dry mouth is reported.   Assess for signs and symptoms of opioid endocrinopathy, such as sexual dysfunction, decreased libido, osteoporosis, osteopenia or infertility.   When endocrinopathy is present, anticipate changing opioid medication, tapering or discontinuation of opioid or initiation of hormone supplementation.   Assess for signs/symptoms of sleep-disordered breathing.   Anticipate referral for sleep study and potential tapering or discontinuation of opioid and initiation of noninvasive positive pressure breathing or adaptive servo ventilation device.   Evaluate risk of opioid misuse prior to or early in treatment with opioids.   Provide anticipatory guidance regarding use and misuse of opioid medication, including not crushing pills, dissolving in juice or mixing with applesauce; consider change to crush-resistant opioid.   Complete periodic screening for opioid misuse and/or signs of substance tolerance (increased dose to reach desired effect, decreased effect with same dose).   Monitor drug-taking behaviors, such as hoarding medication, independently increasing dose, using for other than analgesia and seeing multiple physicians for prescriptions; review state prescription drug monitoring database.   Consider written agreement if patient has used opioids for more than 30 days or episodically over 1 year; required use of nonpharmacologic therapy, urine testing, pill counting, banning sharing or selling of opioids.   When tapering or stopping opioids, frame the discussion in terms of safety and efficacy; reaffirm commitment to patient's health and new treatment plan;  respond to fear with empathy; maintain consistent message and approach.

## 2020-12-05 NOTE — Telephone Encounter (Signed)
   Telephone encounter was:  Successful.  12/05/2020 Name: Monique [name] MRN: [id number] DOB: 03-Sep-1971  Monique [name] is a 50 y.o. year old female who is a primary care patient of [name], MD . The community resource team was consulted for assistance with pain method   Care guide performed the following interventions: Patient provided with information about care guide support team and interviewed to confirm resource needs Discussed resources to assist with pain  Follow up call placed to community resources to determine status of patients referral Follow up call placed to the patient to discuss status of referral.  Follow Up Plan:  Care guide will follow up with patient by phone over the next pain [place] , [place], Care Management  [phone] [address] , [place] [address] Email : [email or web address]

## 2020-12-05 NOTE — Patient Outreach (Signed)
Medicaid Managed Care   Nurse Care Manager Note  12/05/2020 Name:  Monique [name] MRN:  [id number] DOB:  04/09/1971  Monique [name] is an 50 y.o. year old female who is a primary patient of [name], MD.  The [facility] Managed Care Coordination team was consulted for assistance with:    chronic healthcare management needs.  Monique [name] was given information about Medicaid Managed Care Coordination team services today. Monique [name] agreed to services and verbal consent obtained.  Engaged with patient by telephone for follow up visit in response to provider referral for case management and/or care coordination services.   Assessments/Interventions:  Review of past medical history, allergies, medications, health status, including review of consultants reports, laboratory and other test data, was performed as part of comprehensive evaluation and provision of chronic care management services.  SDOH (Social Determinants of Health) assessments and interventions performed:   Care Plan  No Known Allergies  Medications Reviewed Today    Reviewed by Gayla Medicus, RN (Registered Nurse) on 12/05/20 at [place] List Status: <None>  Medication Order Taking? Sig Documenting Provider Last Dose Status Informant  ADVAIR DISKUS 250-50 MCG/DOSE AEPB [id number] Yes Inhale 1 puff into the lungs 2 (two) times daily. [name], MD Taking Active   albuterol (VENTOLIN HFA) 108 (90 Base) MCG/ACT inhaler [id number] Yes Inhale 2 puffs into the lungs every 4 (four) hours as needed for wheezing or shortness of breath. [name], MD Taking Active   Ascorbic Acid (VITAMIN C) 1000 MG tablet [id number] Yes Take 1,000 mg by mouth daily. [provider] Taking Active   atorvastatin (LIPITOR) 10 MG tablet [id number] Yes Take 1 tablet (10 mg total) by mouth daily. [name], MD Taking Active   betamethasone dipropionate 0.05 % cream [id number] Yes APPLY TOPICALLY TWICE DAILY TO AFFECTED  AREA AS NEEDED [provider] Taking Active   Calcipotriene-Betameth Diprop (ENSTILAR) 0.005-0.064 % FOAM [id number] Yes Apply to aa's of the hands and feet BID x 2 weeks. Then decrease use to QD 5d/wk PRN. Monique Bathe, MD Taking Active   Calcium Carbonate-Vit D-Min [facility] CALCIUM 1200) 1200-1000 MG-UNIT CHEW [id number] Yes Chew 1,200 mg by mouth daily with breakfast. Take in combination with vitamin D and magnesium. Monique Pointer, MD Taking Active   [facility]) 2 % Kerby Moors [id number]  Apply 1 application topically 2 (two) times daily. Monique Bathe, MD  Active   DULoxetine (CYMBALTA) 20 MG capsule [id number] Yes Take 20 mg by mouth in the morning, at noon, and at bedtime. Monique Bene, MD Taking Active   ferrous sulfate 324 (65 Fe) MG TBEC [id number] Yes Take 1 tablet (325 mg total) by mouth daily. [name], MD Taking Active   IMBRUVICA 420 MG TABS [id number] Yes TAKE 1 TABLET BY MOUTH DAILY. Lloyd Huger, MD Taking Active   lidocaine-prilocaine (EMLA) cream [id number] Yes Apply 1 application topically as needed. [name], MD Taking Active   loratadine (CLARITIN) 10 MG tablet [id number] Yes TAKE 1 TABLET BY MOUTH ONCE DAILY IN THE Jessee Avers, MD Taking Active   Magnesium 500 MG TABS [id number] Yes Take by mouth daily. [provider] Taking Active   meloxicam (MOBIC) 15 MG tablet [id number]  Take 1 tablet (15 mg total) by mouth daily. Monique Pointer, MD  Expired 11/05/20 2359   METROCREAM 0.75 % cream [id number] Yes Apply thin coat to face twice a day as directed. Monique [name], [place], MD Taking Active   mupirocin ointment (  BACTROBAN) 2 % [id number] No Apply 1 application topically 2 (two) times daily.  Patient not taking: Reported on 12/05/2020   [name], MD Not Taking Active   omeprazole (PRILOSEC) 20 MG capsule [id number] Yes Take 1 capsule (20 mg total) by mouth daily. [name], MD Taking Active   Oxymetazoline HCl  (RHOFADE) 1 % CREA [id number] Yes Apply a thin coat to the entire face every morning. Monique Bathe, MD Taking Active   pregabalin (LYRICA) 100 MG capsule [id number] No Take 100 mg by mouth 3 (three) times daily.  Patient not taking: Reported on 12/05/2020   [provider] Not Taking Active Self  sertraline (ZOLOFT) 100 MG tablet [id number] Yes TAKE 1 AND 1/2 TABLETS BY MOUTH DAILY Monique, Drue Stager, MD Taking Active   sulfamethoxazole-trimethoprim (BACTRIM DS) 800-160 MG tablet [id number] No Take 1 tablet by mouth 2 (two) times daily.  Patient not taking: Reported on 12/05/2020   [name], MD Not Taking Active   tiZANidine (ZANAFLEX) 2 MG tablet [id number]  Take 1 tablet (2 mg total) by mouth every 8 (eight) hours as needed for muscle spasms. Monique Pointer, MD  Expired 11/05/20 2359   triamcinolone cream (KENALOG) 0.1 % [id number] Yes Apply 1 application topically 2 (two) times daily. [name], MD Taking Active   vitamin B-12 (CYANOCOBALAMIN) 500 MCG tablet [id number] Yes Take 500 mcg by mouth daily. [provider] Taking Active   Med List Note Monique [name], RPH-CPP 02/04/19 1518): Ibrutinib filled at [facility] 05-13-18 09-29-18 Received permission from [name] to stop Imbruvica 7 days for procedure.          Patient Active Problem List   Diagnosis Date Noted  . Acute right hip pain 06/05/2020  . 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 Bilateral leg pain 11/03/2019  . Iron deficiency anemia 10/15/2019  . Bilateral arm pain 08/05/2019  . Bilateral hand numbness 08/05/2019  . Weakness of both hands 08/05/2019  . Chronic musculoskeletal pain 07/19/2019  . Chronic neuropathic pain 07/19/2019  . 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 Special screening for malignant neoplasms, colon   . Polyp of sigmoid colon   . Hemorrhoids   . Trigeminal neuralgia 10/05/2018  . Lumbar radiculitis (Right) 09/24/2018  . Hypocalcemia  06/03/2018  . Vitamin D insufficiency 06/03/2018  . Abnormal MRI, lumbar spine (06/02/2020) 06/03/2018  . Chronic hip pain (Right) 06/03/2018  . Spondylosis without myelopathy or radiculopathy, lumbar region 06/03/2018  . DDD (degenerative disc disease), lumbar 06/02/2018  . Lumbar facet hypertrophy 06/02/2018  . Lumbar facet arthropathy 06/02/2018  . Lumbar facet syndrome (Bilateral) (R>L) 06/02/2018  . Marijuana use 05/19/2018  . Chronic lower extremity pain (1ry area of Pain) (Bilateral) (R>L) 05/13/2018  . Chronic low back pain (2ry area of Pain) (Bilateral) (R>L) w/ sciatica (Bilateral) 05/13/2018  . Chronic pain syndrome 05/13/2018  . Opiate use 05/13/2018  . Disorder of skeletal system 05/13/2018  . Pharmacologic therapy 05/13/2018  . Problems influencing health status 05/13/2018  . Mastalgia 05/05/2018  . Breast mass, right 01/07/2018  . Face pain 12/11/2017  . Tingling 12/11/2017  . Thoracic aortic atherosclerosis ([place]) 08/20/2017  . Emphysema of lung ([place]) 08/20/2017  . Adductor tendinitis 04/23/2017  . Trochanteric bursitis of right hip 04/23/2017  . GERD without esophagitis 12/11/2016  . CLL (chronic lymphocytic leukemia) ([place]) 11/24/2016  . Pap smear abnormality of cervix/human papillomavirus (HPV) positive 09/12/2016  . Stress incontinence 09/04/2016  . B12 deficiency 07/10/2015  . Insomnia,  persistent 07/10/2015  . Anorexia nervosa, restricting type 07/10/2015  . Anxiety, generalized 07/10/2015  . H/O suicide attempt 07/10/2015  . Lymphocytosis 07/10/2015  . Obsessive-compulsive disorder 07/10/2015  . Tobacco use 07/10/2015  . History of cervical dysplasia 07/18/2014    Conditions to be addressed/monitored per PCP order:  chronic healthcare management needs, anxiety, depression, chronic pain, h/o leukemia, cervical cancer, degenerative disc disease.  Care Plan : Chronic Pain (Adult)  Updates made by Gayla Medicus, RN since 12/05/2020 12:00 AM    Problem:  Chronic Pain Management (Chronic Pain)     Long-Range Goal: Chronic Pain Managed   Start Date: 08/31/2020  Expected End Date: 03/07/2021  Recent Progress: Not on track  Priority: High  Note:    Long-Range Goal: Chronic Pain Managed      Start Date: 08/31/2020   Expected End Date: 03/07/21   This Visit's Progress: Not on track   Priority: High   Note:    Current Barriers:   Care Coordination needs related to chronic pain.  Patient needs MRI and epidural injection.  To date, insurance has denied approval.  Patient is out of [place] trouble getting refilled.   Nurse Case Manager Clinical Goal(s):   Over the next 30 days, patient will work with provider to address needs related to approval of needed services.  Update 09/25/20:  No approval of MRI and epidural to date by insurance.  Continues to take medications as needed.  Update 10/25/20: Patient states she will call her Provider to follow up on status of MRI and epidural.  Update 12/05/20:  No updates on MRI or epidural -patient has not heard anything from Dr. Adalberto Cole office and states she will call for an update as it has been since November that she has heard anything.  Over the next 90 days, patient will attend all scheduled medical appointments:     Interventions:   Inter-disciplinary care team collaboration (see longitudinal plan of care)  Evaluation of current treatment plan related to chronic pain and patient's adherence to plan as established by provider.  Reviewed medications with patient.  Discussed plans with patient for ongoing care management follow up and provided patient with direct contact information for care management team  Reviewed scheduled/upcoming provider appointments.  Pharmacy referral to review medications.  Social Work referral for anxiety/depression, h/o eating disorder and self harm.  Care Guide referral for DME  resources for patient in which [facility] will help pay  for.     Patient Goals/Self-Care Activities  Over the next 30 days, patient will:   -Attends all scheduled provider appointments  Calls provider office for new concerns or questions.  Follow up with Dr. Dossie Arbour regarding MRI/epidural approval.     Follow Up Plan: The Managed Medicaid care management team will reach out to the patient again over the next 30 days.   The patient has been provided with contact information for the Managed Medicaid care management team and has been advised to call with any health related questions or concerns.   RNCM will follow up with provider regarding patient request for DME, chair that provides massage to legs.  Patient feels like this would help her chronic leg pain.  Update 10/25/20:  Patient has appointment with PCP 10/26/20 and will follow up with her regarding DME.                 Plan: RNCM will follow up with patient within 30 days.  Follow-up:  Patient agrees to Care Plan  and Follow-up.   Care Plan : Chronic Pain (Adult)  Updates made by Gayla Medicus, RN since 12/05/2020 12:00 AM    Problem: Pain Management Plan (Chronic Pain)     Long-Range Goal: Pain Management Plan Developed   Start Date: 08/31/2020  Expected End Date: 03/07/2021  This Visit's Progress: Not on track  Priority: High  Note:   Evidence-based guidance:   Acknowledge patient as the expert in pain self-management.   Partner to set a comfort goal that allows for return to work, school, usual activity and acceptable quality of life.   Assess pain level, usual behavior with and without pain and symptoms that commonly occur with chronic pain, such as fatigue, sleep problems, cognitive and mood disturbance; use a consistent pain scale.   Determine if pain is associated with mobility or at rest, location, intensity, frequency, duration, recurrence, pattern, triggers, relieving factors and description, such as cramping, burning or aching.    Mutually develop an  interdisciplinary, multi-modal and detailed pain management plan with patient and family or caregiver; track the patient's response to pain management and adjust plan as needed.       Problem: Harm or Injury (Chronic Pain)     Long-Range Goal: Harm or Injury Prevented   Start Date: 08/31/2020  Expected End Date: 03/07/2021  This Visit's Progress: On track  Priority: High  Note:   Evidence-based guidance:   Address risk for personal injury, such as falls, motor vehicle accidents, self-harm due to medication side effects, compromised mobility or diminished perception, reasoning, decision-making and judgment.   Engage family in providing a safe home environment and closely monitoring changes in the patient's level of sedation and emotional state, such as negativity, hopelessness and suicidal ideation.   Explore suicidal tendencies compassionately, yet directly, by asking about suicidal ideation, attempt history and family history.   Maintain frequent, structured and supportive contact, such as by phone, office or home visit; collaborate closely with behavioral health specialists or psychiatry.   Make immediate arrangements for evaluation at emergency department, community mental health agency or other psychiatric service when patient expresses positive suicidal ideation with a plan and access to lethal means.   Promote fall risk prevention by making home and environmental adjustments; provide clear instructions regarding ability to drive.   Assess for opioid-induced constipation; provide anticipatory guidance regarding prevention when beginning opioid use by using stool softener or laxative, as well as increasing fluids and dietary fiber.   When opioid-induced constipation is noted, optimize lifestyle interventions and anticipate the use of opioid antagonist as well as tapering, discontinuation or change of opioid.   Encourage frequent oral hygiene (brushing and rinsing), the use of  xylitol-containing gum as well as sugar-free and decaffeinated beverages when dry mouth is reported.   Assess for signs and symptoms of opioid endocrinopathy, such as sexual dysfunction, decreased libido, osteoporosis, osteopenia or infertility.   When endocrinopathy is present, anticipate changing opioid medication, tapering or discontinuation of opioid or initiation of hormone supplementation.   Assess for signs/symptoms of sleep-disordered breathing.   Anticipate referral for sleep study and potential tapering or discontinuation of opioid and initiation of noninvasive positive pressure breathing or adaptive servo ventilation device.   Evaluate risk of opioid misuse prior to or early in treatment with opioids.   Provide anticipatory guidance regarding use and misuse of opioid medication, including not crushing pills, dissolving in juice or mixing with applesauce; consider change to crush-resistant opioid.   Complete periodic screening for opioid misuse and/or signs  of substance tolerance (increased dose to reach desired effect, decreased effect with same dose).   Monitor drug-taking behaviors, such as hoarding medication, independently increasing dose, using for other than analgesia and seeing multiple physicians for prescriptions; review state prescription drug monitoring database.   Consider written agreement if patient has used opioids for more than 30 days or episodically over 1 year; required use of nonpharmacologic therapy, urine testing, pill counting, banning sharing or selling of opioids.   When tapering or stopping opioids, frame the discussion in terms of safety and efficacy; reaffirm commitment to patient's health and new treatment plan; respond to fear with empathy; maintain consistent message and approach.      Follow Up:  Patient agrees to Care Plan and Follow-up.  Plan: The Managed Medicaid care management team will reach out to the patient again over the next 30 days. and  The patient has been provided with contact information for the Managed Medicaid care management team and has been advised to call with any health related questions or concerns.  Date/time of next scheduled RN care management/care coordination outreach:  01/05/21 at 0900.

## 2020-12-06 ENCOUNTER — Encounter: Payer: Self-pay | Admitting: Family Medicine

## 2020-12-06 ENCOUNTER — Other Ambulatory Visit: Payer: Self-pay

## 2020-12-06 ENCOUNTER — Ambulatory Visit: Payer: Medicaid Other | Admitting: Family Medicine

## 2020-12-06 VITALS — BP 112/68 | HR 103 | Temp 98.1°F | Resp 16 | Ht 67.0 in | Wt 168.0 lb

## 2020-12-06 DIAGNOSIS — I700 Atherosclerosis of aorta: Secondary | ICD-10-CM | POA: Diagnosis not present

## 2020-12-06 DIAGNOSIS — M792 Neuralgia and neuritis, unspecified: Secondary | ICD-10-CM

## 2020-12-06 DIAGNOSIS — R87612 Low grade squamous intraepithelial lesion on cytologic smear of cervix (LGSIL): Secondary | ICD-10-CM

## 2020-12-06 DIAGNOSIS — C9110 Chronic lymphocytic leukemia of B-cell type not having achieved remission: Secondary | ICD-10-CM | POA: Diagnosis not present

## 2020-12-06 DIAGNOSIS — F331 Major depressive disorder, recurrent, moderate: Secondary | ICD-10-CM | POA: Diagnosis not present

## 2020-12-06 DIAGNOSIS — K219 Gastro-esophageal reflux disease without esophagitis: Secondary | ICD-10-CM | POA: Diagnosis not present

## 2020-12-06 DIAGNOSIS — E559 Vitamin D deficiency, unspecified: Secondary | ICD-10-CM

## 2020-12-06 DIAGNOSIS — Z79899 Other long term (current) drug therapy: Secondary | ICD-10-CM

## 2020-12-06 DIAGNOSIS — E538 Deficiency of other specified B group vitamins: Secondary | ICD-10-CM | POA: Diagnosis not present

## 2020-12-06 DIAGNOSIS — G8929 Other chronic pain: Secondary | ICD-10-CM

## 2020-12-06 DIAGNOSIS — J432 Centrilobular emphysema: Secondary | ICD-10-CM | POA: Diagnosis not present

## 2020-12-06 NOTE — Patient Outreach (Signed)
Medicaid Managed Care    Pharmacy Note  12/06/2020 Name: Monique [name] MRN: [id number] DOB: [birth year]  Monique [name] is a 50 y.o. year old female who is a primary care patient of [name], MD. The [facility] Managed Care Coordination team was consulted for assistance with disease management and care coordination needs.    Engaged with patient Engaged with patient by telephone for initial visit in response to referral for case management and/or care coordination services.  Monique [name] was given information about Managed Medicaid Care Coordination team services today. [name] agreed to services and verbal consent obtained.   Objective:  Lab Results  Component Value Date   CREATININE 0.86 09/28/2020   CREATININE 0.81 06/13/2020   CREATININE 0.81 01/27/2020    Lab Results  Component Value Date   HGBA1C 5.1 08/31/2019       Component Value Date/Time   CHOL 133 08/31/2019 0000   TRIG 54 08/31/2019 0000   HDL 57 08/31/2019 0000   CHOLHDL 2.3 08/31/2019 0000   VLDL 15 09/12/2016 0948   LDLCALC 63 08/31/2019 0000    Other: (TSH, CBC, Vit D, etc.)  Clinical ASCVD: No  The 10-year ASCVD risk score [name], et al., 2013) is: 1.2%   Values used to calculate the score:     Age: 50 years     Sex: Female     Is Non-Hispanic African American: No     Diabetic: No     Tobacco smoker: Yes     Systolic Blood Pressure: 96 mmHg     Is BP treated: No     HDL Cholesterol: 57 mg/dL     Total Cholesterol: 133 mg/dL    Other: (CHADS2VASc if Afib, PHQ9 if depression, MMRC or CAT for COPD, ACT, DEXA)  BP Readings from Last 3 Encounters:  11/01/20 96/62  10/26/20 118/78  10/23/20 118/70    Assessment/Interventions: Review of patient past medical history, allergies, medications, health status, including review of consultants reports, laboratory and other test data, was performed as part of comprehensive evaluation and provision of chronic care management services.    Pain: Patient in extreme amount of pain, unable to get Lyrica refills for 2 weeks. Could barely talk on the phone Plan: Cut visit short to coordinate with Rx and Neuro to get script sent in today. Will F/U at later date regarding full assessment  SDOH (Social Determinants of Health) assessments and interventions performed:    Care Plan  No Known Allergies  Medications Reviewed Today    Reviewed by [name], MD (Physician) on 12/05/20 at 1308  Med List Status: <None>  Medication Order Taking? Sig Documenting Provider Last Dose Status Informant  ADVAIR DISKUS 250-50 MCG/DOSE AEPB [id number] No Inhale 1 puff into the lungs 2 (two) times daily. [name], MD Taking Active   albuterol (VENTOLIN HFA) 108 (90 Base) MCG/ACT inhaler [id number] No Inhale 2 puffs into the lungs every 4 (four) hours as needed for wheezing or shortness of breath. [name], MD Taking Active   Ascorbic Acid (VITAMIN C) 1000 MG tablet [id number] No Take 1,000 mg by mouth daily. [provider] Taking Active   atorvastatin (LIPITOR) 10 MG tablet [id number] No Take 1 tablet (10 mg total) by mouth daily. [name], MD Taking Active   betamethasone dipropionate 0.05 % cream [id number] No APPLY TOPICALLY TWICE DAILY TO AFFECTED AREA AS NEEDED [provider] Taking Active   Calcipotriene-Betameth Diprop (ENSTILAR) 0.005-0.064 % FOAM [id number] No  Apply to aa's of the hands and feet BID x 2 weeks. Then decrease use to QD 5d/wk PRN. [name], MD Taking Active   Calcium Carbonate-Vit D-Min [facility] CALCIUM 1200) 1200-1000 MG-UNIT CHEW [id number] No Chew 1,200 mg by mouth daily with breakfast. Take in combination with vitamin D and magnesium. [name], MD Taking Active   Crisaborole [facility]) 2 % OINT [id number] Yes Apply 1 application topically 2 (two) times daily. [name], MD  Active   DULoxetine (CYMBALTA) 20 MG capsule [id number] No Take 20 mg by mouth in the morning,  at noon, and at bedtime. Anabel Bene, MD Taking Active   ferrous sulfate 324 (65 Fe) MG TBEC [id number] No Take 1 tablet (325 mg total) by mouth daily. [name], MD Taking Active   IMBRUVICA 420 MG TABS [id number] No TAKE 1 TABLET BY MOUTH DAILY. Lloyd Huger, MD Taking Active   lidocaine-prilocaine (EMLA) cream [id number] No Apply 1 application topically as needed. [name], MD Taking Active   loratadine (CLARITIN) 10 MG tablet [id number] No TAKE 1 TABLET BY MOUTH ONCE DAILY IN THE Dyanne Iha, Drue Stager, MD Taking Active   Magnesium 500 MG TABS [id number] No Take by mouth daily. [provider] Taking Active   meloxicam (MOBIC) 15 MG tablet [id number] No Take 1 tablet (15 mg total) by mouth daily. [name], MD Taking Expired 11/05/20 2359   METROCREAM 0.75 % cream [id number] No Apply thin coat to face twice a day as directed. Moye, [place], MD Taking Active   mupirocin ointment (BACTROBAN) 2 % [id number] No Apply 1 application topically 2 (two) times daily.  Patient not taking: Reported on 12/05/2020   [name], MD Not Taking Active   omeprazole (PRILOSEC) 20 MG capsule [id number] No Take 1 capsule (20 mg total) by mouth daily. [name], MD Taking Active   Oxymetazoline HCl (RHOFADE) 1 % CREA [id number] No Apply a thin coat to the entire face every morning. [name], MD Taking Active   pregabalin (LYRICA) 100 MG capsule [id number] No Take 100 mg by mouth 3 (three) times daily.  Patient not taking: Reported on 12/05/2020   [provider] Not Taking Active Self  sertraline (ZOLOFT) 100 MG tablet [id number] No TAKE 1 AND 1/2 TABLETS BY MOUTH DAILY Sowles, Drue Stager, MD Taking Active   sulfamethoxazole-trimethoprim (BACTRIM DS) 800-160 MG tablet [id number] No Take 1 tablet by mouth 2 (two) times daily.  Patient not taking: Reported on 12/05/2020   [name], MD Not Taking Active   tiZANidine (ZANAFLEX) 2 MG tablet [id number] No  Take 1 tablet (2 mg total) by mouth every 8 (eight) hours as needed for muscle spasms. [name], MD Taking Expired 11/05/20 2359   triamcinolone cream (KENALOG) 0.1 % [id number] No Apply 1 application topically 2 (two) times daily. [name], MD Taking Active   vitamin B-12 (CYANOCOBALAMIN) 500 MCG tablet [id number] No Take 500 mcg by mouth daily. [provider] Taking Active   Med List Note [name], RPH-CPP 02/04/19 1518): Ibrutinib filled at [facility] 05-13-18 09-29-18 Received permission from [name] to stop Imbruvica 7 days for procedure.          Patient Active Problem List   Diagnosis Date Noted  . Acute right hip pain 06/05/2020  . 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 Bilateral leg pain 11/03/2019  . Iron deficiency anemia 10/15/2019  . Bilateral arm pain 08/05/2019  . Bilateral hand numbness 08/05/2019  .  Weakness of both hands 08/05/2019  . Chronic musculoskeletal pain 07/19/2019  . Chronic neuropathic pain 07/19/2019  . 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 Special screening for malignant neoplasms, colon   . Polyp of sigmoid colon   . Hemorrhoids   . Trigeminal neuralgia 10/05/2018  . Lumbar radiculitis (Right) 09/24/2018  . Hypocalcemia 06/03/2018  . Vitamin D insufficiency 06/03/2018  . Abnormal MRI, lumbar spine (06/02/2020) 06/03/2018  . Chronic hip pain (Right) 06/03/2018  . Spondylosis without myelopathy or radiculopathy, lumbar region 06/03/2018  . DDD (degenerative disc disease), lumbar 06/02/2018  . Lumbar facet hypertrophy 06/02/2018  . Lumbar facet arthropathy 06/02/2018  . Lumbar facet syndrome (Bilateral) (R>L) 06/02/2018  . Marijuana use 05/19/2018  . Chronic lower extremity pain (1ry area of Pain) (Bilateral) (R>L) 05/13/2018  . Chronic low back pain (2ry area of Pain) (Bilateral) (R>L) w/ sciatica (Bilateral) 05/13/2018  . Chronic pain syndrome 05/13/2018  . Opiate use 05/13/2018   . Disorder of skeletal system 05/13/2018  . Pharmacologic therapy 05/13/2018  . Problems influencing health status 05/13/2018  . Mastalgia 05/05/2018  . Breast mass, right 01/07/2018  . Face pain 12/11/2017  . Tingling 12/11/2017  . Thoracic aortic atherosclerosis ([place]) 08/20/2017  . Emphysema of lung ([place]) 08/20/2017  . Adductor tendinitis 04/23/2017  . Trochanteric bursitis of right hip 04/23/2017  . GERD without esophagitis 12/11/2016  . CLL (chronic lymphocytic leukemia) ([place]) 11/24/2016  . Pap smear abnormality of cervix/human papillomavirus (HPV) positive 09/12/2016  . Stress incontinence 09/04/2016  . B12 deficiency 07/10/2015  . Insomnia, persistent 07/10/2015  . Anorexia nervosa, restricting type 07/10/2015  . Anxiety, generalized 07/10/2015  . H/O suicide attempt 07/10/2015  . Lymphocytosis 07/10/2015  . Obsessive-compulsive disorder 07/10/2015  . Tobacco use 07/10/2015  . History of cervical dysplasia 07/18/2014    Conditions to be addressed/monitored: Pain  Patient Care Plan: Chronic Pain (Adult)    Problem Identified: Chronic Pain Management (Chronic Pain)     Long-Range Goal: Chronic Pain Managed   Start Date: 08/31/2020  Expected End Date: 03/07/2021  Recent Progress: Not on track  Priority: High  Note:    Long-Range Goal: Chronic Pain Managed      Start Date: 08/31/2020   Expected End Date: 03/07/21   This Visit's Progress: Not on track   Priority: High   Note:    Current Barriers:   Care Coordination needs related to chronic pain.  Patient needs MRI and epidural injection.  To date, insurance has denied approval.  Patient is out of [place] trouble getting refilled.   Nurse Case Manager Clinical Goal(s):   Over the next 30 days, patient will work with provider to address needs related to approval of needed services.  Update 09/25/20:  No approval of MRI and epidural to date by insurance.  Continues to take medications as  needed.  Update 10/25/20: Patient states she will call her Provider to follow up on status of MRI and epidural.  Update 12/05/20:  No updates on MRI or epidural -patient has not heard anything from Dr. Adalberto Cole office and states she will call for an update as it has been since November that she has heard anything.  Over the next 90 days, patient will attend all scheduled medical appointments:     Interventions:   Inter-disciplinary care team collaboration (see longitudinal plan of care)  Evaluation of current treatment plan related to chronic pain and patient's adherence to plan as established by provider.  Reviewed medications with patient.  Discussed plans  with patient for ongoing care management follow up and provided patient with direct contact information for care management team  Reviewed scheduled/upcoming provider appointments.  Pharmacy referral to review medications.  Social Work referral for anxiety/depression, h/o eating disorder and self harm.  Care Guide referral for DME  resources for patient in which [facility] will help pay for.     Patient Goals/Self-Care Activities  Over the next 30 days, patient will:   -Attends all scheduled provider appointments  Calls provider office for new concerns or questions.  Follow up with Dr. Dossie Arbour regarding MRI/epidural approval.     Follow Up Plan: The Managed Medicaid care management team will reach out to the patient again over the next 30 days.   The patient has been provided with contact information for the Managed Medicaid care management team and has been advised to call with any health related questions or concerns.   RNCM will follow up with provider regarding patient request for DME, chair that provides massage to legs.  Patient feels like this would help her chronic leg pain.  Update 10/25/20:  Patient has appointment with PCP 10/26/20 and will follow up with her regarding DME.                 Plan:  RNCM will follow up with patient within 30 days.  Follow-up:  Patient agrees to Care Plan and Follow-up.   Patient Care Plan: Chronic Pain (Adult)    Problem Identified: Pain Management Plan (Chronic Pain)     Long-Range Goal: Pain Management Plan Developed   Start Date: 08/31/2020  Expected End Date: 03/07/2021  This Visit's Progress: Not on track  Priority: High  Note:   Evidence-based guidance:   Acknowledge patient as the expert in pain self-management.   Partner to set a comfort goal that allows for return to work, school, usual activity and acceptable quality of life.   Assess pain level, usual behavior with and without pain and symptoms that commonly occur with chronic pain, such as fatigue, sleep problems, cognitive and mood disturbance; use a consistent pain scale.   Determine if pain is associated with mobility or at rest, location, intensity, frequency, duration, recurrence, pattern, triggers, relieving factors and description, such as cramping, burning or aching.    Mutually develop an interdisciplinary, multi-modal and detailed pain management plan with patient and family or caregiver; track the patient's response to pain management and adjust plan as needed.     Task: Partner to Develop Chronic Pain Management Plan   Note:   Care Management Activities:        Notes:    Problem Identified: Chronic Pain Management (Chronic Pain)     Long-Range Goal: Chronic Pain Managed   Start Date: 08/31/2020  Expected End Date: 03/07/2021  Recent Progress: Not on track  Priority: High  Note:   Current Barriers:  . Care Coordination needs related to chronic pain.  Patient needs MRI and epidural injection.  To date, insurance has denied approval.  Nurse Case Manager Clinical Goal(s):  Marland Kitchen Over the next 30 days, patient will work with provider to address needs related to approval of needed services. . Over the next 90 days, patient will attend all scheduled medical appointments:    Interventions:  . Inter-disciplinary care team collaboration (see longitudinal plan of care) . Evaluation of current treatment plan related to chronic pain and patient's adherence to plan as established by provider. . Reviewed medications with patient. . Discussed plans with patient for ongoing  care management follow up and provided patient with direct contact information for care management team . Reviewed scheduled/upcoming provider appointments.  Patient Goals/Self-Care Activities Over the next 30 days, patient will:  -Attends all scheduled provider appointments Calls provider office for new concerns or questions  Follow Up Plan: The Managed Medicaid care management team will reach out to the patient again over the next 30 days.  The patient has been provided with contact information for the Managed Medicaid care management team and has been advised to call with any health related questions or concerns.       Task: Alleviate Barriers to Chronic Pain Management   Note:   Care Management Activities:    - strategies to manage constipation promoted    Notes:    Problem Identified: Harm or Injury (Chronic Pain)     Long-Range Goal: Harm or Injury Prevented   Start Date: 08/31/2020  Expected End Date: 03/07/2021  This Visit's Progress: On track  Priority: High  Note:   Evidence-based guidance:   Address risk for personal injury, such as falls, motor vehicle accidents, self-harm due to medication side effects, compromised mobility or diminished perception, reasoning, decision-making and judgment.   Engage family in providing a safe home environment and closely monitoring changes in the patient's level of sedation and emotional state, such as negativity, hopelessness and suicidal ideation.   Explore suicidal tendencies compassionately, yet directly, by asking about suicidal ideation, attempt history and family history.   Maintain frequent, structured and supportive contact,  such as by phone, office or home visit; collaborate closely with behavioral health specialists or psychiatry.   Make immediate arrangements for evaluation at emergency department, community mental health agency or other psychiatric service when patient expresses positive suicidal ideation with a plan and access to lethal means.   Promote fall risk prevention by making home and environmental adjustments; provide clear instructions regarding ability to drive.   Assess for opioid-induced constipation; provide anticipatory guidance regarding prevention when beginning opioid use by using stool softener or laxative, as well as increasing fluids and dietary fiber.   When opioid-induced constipation is noted, optimize lifestyle interventions and anticipate the use of opioid antagonist as well as tapering, discontinuation or change of opioid.   Encourage frequent oral hygiene (brushing and rinsing), the use of xylitol-containing gum as well as sugar-free and decaffeinated beverages when dry mouth is reported.   Assess for signs and symptoms of opioid endocrinopathy, such as sexual dysfunction, decreased libido, osteoporosis, osteopenia or infertility.   When endocrinopathy is present, anticipate changing opioid medication, tapering or discontinuation of opioid or initiation of hormone supplementation.   Assess for signs/symptoms of sleep-disordered breathing.   Anticipate referral for sleep study and potential tapering or discontinuation of opioid and initiation of noninvasive positive pressure breathing or adaptive servo ventilation device.   Evaluate risk of opioid misuse prior to or early in treatment with opioids.   Provide anticipatory guidance regarding use and misuse of opioid medication, including not crushing pills, dissolving in juice or mixing with applesauce; consider change to crush-resistant opioid.   Complete periodic screening for opioid misuse and/or signs of substance tolerance  (increased dose to reach desired effect, decreased effect with same dose).   Monitor drug-taking behaviors, such as hoarding medication, independently increasing dose, using for other than analgesia and seeing multiple physicians for prescriptions; review state prescription drug monitoring database.   Consider written agreement if patient has used opioids for more than 30 days or episodically over 1 year; required  use of nonpharmacologic therapy, urine testing, pill counting, banning sharing or selling of opioids.   When tapering or stopping opioids, frame the discussion in terms of safety and efficacy; reaffirm commitment to patient's health and new treatment plan; respond to fear with empathy; maintain consistent message and approach.     Task: Identify and Reduce Risks for Harm or Injury   Note:   Care Management Activities:    - strategies to improve or maintain safety promoted    Notes:      Medication Assistance: Unable to get Lyrica Refills  Follow up: Agree/  Plan: The care management team will reach out to the patient again over the next 30 days.   Date/time of next scheduled Pharmacist care management/care coordination outreach:    [place] Constable, Pharm.D., Managed Medicaid Pharmacist - [phone]

## 2020-12-06 NOTE — Patient Instructions (Signed)
Visit Information  Ms. Mcginty was given information about Medicaid Managed Care team care coordination services as a part of their [facility] Medicaid benefit. Aryanah Enslow verbally consented to engagement with the [facility] Managed Care team.   For questions related to your [facility] health plan, please call: [phone]  If you would like to schedule transportation through your [facility] plan, please call the following number at least 2 days in advance of your appointment: [phone]   Call the [place] at [phone], at any time, 24 hours a day, 7 days a week. If you are in danger or need immediate medical attention call 911.  Ms. Duross - following are the goals we discussed in your visit today:  Goals Addressed            This Visit's Progress   . Manage My Medicine       Timeframe:  Short-Term Goal Priority:  High Start Date:                             Expected End Date:                       Follow Up Date Monthly   - call for medicine refill 2 or 3 days before it runs out - call if I am sick and can't take my medicine    Why is this important?   . These steps will help you keep on track with your medicines.   Notes: Patient having trouble getting refills from Rx, will call me if more issues arise       Please see education materials related to Pain provided as print materials.   Patient verbalizes understanding of instructions provided today.   The Managed Medicaid care management team will reach out to the patient again over the next 30 days.   [place] Constable, Pharm.D., Managed Medicaid Pharmacist [phone]   Following is a copy of your plan of care:  Patient Care Plan: Chronic Pain (Adult)    Problem Identified: Chronic Pain Management (Chronic Pain)     Long-Range Goal: Chronic Pain Managed   Start Date: 08/31/2020  Expected End Date: 03/07/2021  Recent Progress: Not on track  Priority: High  Note:     Long-Range Goal: Chronic Pain Managed      Start Date: 08/31/2020   Expected End Date: 03/07/21   This Visit's Progress: Not on track   Priority: High   Note:    Current Barriers:   Care Coordination needs related to chronic pain.  Patient needs MRI and epidural injection.  To date, insurance has denied approval.  Patient is out of [place] trouble getting refilled.   Nurse Case Manager Clinical Goal(s):   Over the next 30 days, patient will work with provider to address needs related to approval of needed services.  Update 09/25/20:  No approval of MRI and epidural to date by insurance.  Continues to take medications as needed.  Update 10/25/20: Patient states she will call her Provider to follow up on status of MRI and epidural.  Update 12/05/20:  No updates on MRI or epidural -patient has not heard anything from Dr. Adalberto Cole office and states she will call for an update as it has been since November that she has heard anything.  Over the next 90 days, patient will attend all scheduled medical appointments:     Interventions:  Inter-disciplinary care team collaboration (see longitudinal plan of care)  Evaluation of current treatment plan related to chronic pain and patient's adherence to plan as established by provider.  Reviewed medications with patient.  Discussed plans with patient for ongoing care management follow up and provided patient with direct contact information for care management team  Reviewed scheduled/upcoming provider appointments.  Pharmacy referral to review medications.  Social Work referral for anxiety/depression, h/o eating disorder and self harm.  Care Guide referral for DME  resources for patient in which [facility] will help pay for.     Patient Goals/Self-Care Activities  Over the next 30 days, patient will:   -Attends all scheduled provider appointments  Calls provider office for new concerns or questions.  Follow up  with Dr. Dossie Arbour regarding MRI/epidural approval.     Follow Up Plan: The Managed Medicaid care management team will reach out to the patient again over the next 30 days.   The patient has been provided with contact information for the Managed Medicaid care management team and has been advised to call with any health related questions or concerns.   RNCM will follow up with provider regarding patient request for DME, chair that provides massage to legs.  Patient feels like this would help her chronic leg pain.  Update 10/25/20:  Patient has appointment with PCP 10/26/20 and will follow up with her regarding DME.                 Plan: RNCM will follow up with patient within 30 days.  Follow-up:  Patient agrees to Care Plan and Follow-up.   Patient Care Plan: Chronic Pain (Adult)    Problem Identified: Pain Management Plan (Chronic Pain)     Long-Range Goal: Pain Management Plan Developed   Start Date: 08/31/2020  Expected End Date: 03/07/2021  This Visit's Progress: Not on track  Priority: High  Note:   Evidence-based guidance:   Acknowledge patient as the expert in pain self-management.   Partner to set a comfort goal that allows for return to work, school, usual activity and acceptable quality of life.   Assess pain level, usual behavior with and without pain and symptoms that commonly occur with chronic pain, such as fatigue, sleep problems, cognitive and mood disturbance; use a consistent pain scale.   Determine if pain is associated with mobility or at rest, location, intensity, frequency, duration, recurrence, pattern, triggers, relieving factors and description, such as cramping, burning or aching.    Mutually develop an interdisciplinary, multi-modal and detailed pain management plan with patient and family or caregiver; track the patient's response to pain management and adjust plan as needed.     Task: Partner to Develop Chronic Pain Management Plan   Note:   Care  Management Activities:    - sharing of pain management plan with teachers and other caregivers encouraged    Notes:    Problem Identified: Chronic Pain Management (Chronic Pain)     Long-Range Goal: Chronic Pain Managed   Start Date: 08/31/2020  Expected End Date: 03/07/2021  Recent Progress: Not on track  Priority: High  Note:   Current Barriers:  . Care Coordination needs related to chronic pain.  Patient needs MRI and epidural injection.  To date, insurance has denied approval.  Nurse Case Manager Clinical Goal(s):  Marland Kitchen Over the next 30 days, patient will work with provider to address needs related to approval of needed services. . Over the next 90 days, patient will attend all scheduled  medical appointments:   Interventions:  . Inter-disciplinary care team collaboration (see longitudinal plan of care) . Evaluation of current treatment plan related to chronic pain and patient's adherence to plan as established by provider. . Reviewed medications with patient. . Discussed plans with patient for ongoing care management follow up and provided patient with direct contact information for care management team . Reviewed scheduled/upcoming provider appointments.  Patient Goals/Self-Care Activities Over the next 30 days, patient will:  -Attends all scheduled provider appointments Calls provider office for new concerns or questions  Follow Up Plan: The Managed Medicaid care management team will reach out to the patient again over the next 30 days.  The patient has been provided with contact information for the Managed Medicaid care management team and has been advised to call with any health related questions or concerns.       Task: Alleviate Barriers to Chronic Pain Management   Note:   Care Management Activities:    - strategies to manage constipation promoted    Notes:    Problem Identified: Harm or Injury (Chronic Pain)     Long-Range Goal: Harm or Injury Prevented    Start Date: 08/31/2020  Expected End Date: 03/07/2021  This Visit's Progress: On track  Priority: High  Note:   Evidence-based guidance:   Address risk for personal injury, such as falls, motor vehicle accidents, self-harm due to medication side effects, compromised mobility or diminished perception, reasoning, decision-making and judgment.   Engage family in providing a safe home environment and closely monitoring changes in the patient's level of sedation and emotional state, such as negativity, hopelessness and suicidal ideation.   Explore suicidal tendencies compassionately, yet directly, by asking about suicidal ideation, attempt history and family history.   Maintain frequent, structured and supportive contact, such as by phone, office or home visit; collaborate closely with behavioral health specialists or psychiatry.   Make immediate arrangements for evaluation at emergency department, community mental health agency or other psychiatric service when patient expresses positive suicidal ideation with a plan and access to lethal means.   Promote fall risk prevention by making home and environmental adjustments; provide clear instructions regarding ability to drive.   Assess for opioid-induced constipation; provide anticipatory guidance regarding prevention when beginning opioid use by using stool softener or laxative, as well as increasing fluids and dietary fiber.   When opioid-induced constipation is noted, optimize lifestyle interventions and anticipate the use of opioid antagonist as well as tapering, discontinuation or change of opioid.   Encourage frequent oral hygiene (brushing and rinsing), the use of xylitol-containing gum as well as sugar-free and decaffeinated beverages when dry mouth is reported.   Assess for signs and symptoms of opioid endocrinopathy, such as sexual dysfunction, decreased libido, osteoporosis, osteopenia or infertility.   When endocrinopathy is present,  anticipate changing opioid medication, tapering or discontinuation of opioid or initiation of hormone supplementation.   Assess for signs/symptoms of sleep-disordered breathing.   Anticipate referral for sleep study and potential tapering or discontinuation of opioid and initiation of noninvasive positive pressure breathing or adaptive servo ventilation device.   Evaluate risk of opioid misuse prior to or early in treatment with opioids.   Provide anticipatory guidance regarding use and misuse of opioid medication, including not crushing pills, dissolving in juice or mixing with applesauce; consider change to crush-resistant opioid.   Complete periodic screening for opioid misuse and/or signs of substance tolerance (increased dose to reach desired effect, decreased effect with same dose).   Monitor  drug-taking behaviors, such as hoarding medication, independently increasing dose, using for other than analgesia and seeing multiple physicians for prescriptions; review state prescription drug monitoring database.   Consider written agreement if patient has used opioids for more than 30 days or episodically over 1 year; required use of nonpharmacologic therapy, urine testing, pill counting, banning sharing or selling of opioids.   When tapering or stopping opioids, frame the discussion in terms of safety and efficacy; reaffirm commitment to patient's health and new treatment plan; respond to fear with empathy; maintain consistent message and approach.     Task: Identify and Reduce Risks for Harm or Injury   Note:   Care Management Activities:    - strategies to improve or maintain safety promoted    Notes:    Patient Care Plan: Medication management    Problem Identified: Health Promotion or Disease Self-Management (General Plan of Care)     Goal: Self-Management Plan Developed   Note:   Current Barriers:  . Unable to get refills from Rx .   Pharmacist Clinical Goal(s):  Marland Kitchen Over the  next 30 days, patient will contact provider office for questions/concerns as evidenced notation of same in electronic health record through collaboration with PharmD and provider.  .   Interventions: . Inter-disciplinary care team collaboration (see longitudinal plan of care) . Comprehensive medication review performed; medication list updated in electronic medical record  Health Maintenance  Patient Goals/Self-Care Activities . Over the next 30 days, patient will:  - collaborate with provider on medication access solutions  Follow Up Plan: The patient has been provided with contact information for the care management team and has been advised to call with any health related questions or concerns.     Task: Mutually Develop and Royce Macadamia Achievement of Patient Goals   Note:   Care Management Activities:    - verbalization of feelings encouraged    Notes:

## 2020-12-07 ENCOUNTER — Ambulatory Visit: Payer: Medicaid Other | Admitting: Family Medicine

## 2020-12-07 LAB — VITAMIN B12: Vitamin B-12: >2000 pg/mL — ABNORMAL HIGH (ref 200–1100)

## 2020-12-07 LAB — LIPID PANEL
Cholesterol: 142 mg/dL (ref <200)
HDL: 44 mg/dL — ABNORMAL LOW (ref >=50)
LDL Cholesterol (Calc): 80 mg/dL (calc)
Non-HDL Cholesterol (Calc): 98 mg/dL (calc) (ref <130)
Total CHOL/HDL Ratio: 3.2 (calc) (ref <5.0)
Triglycerides: 94 mg/dL (ref <150)

## 2020-12-07 LAB — COMPLETE METABOLIC PANEL WITH GFR
AG Ratio: 1.9 (calc) (ref 1.0–2.5)
ALT: 13 U/L (ref 6–29)
AST: 15 U/L (ref 10–35)
Albumin: 4 g/dL (ref 3.6–5.1)
Alkaline phosphatase (APISO): 64 U/L (ref 37–153)
BUN: 19 mg/dL (ref 7–25)
CO2: 26 mmol/L (ref 20–32)
Calcium: 9.4 mg/dL (ref 8.6–10.4)
Chloride: 107 mmol/L (ref 98–110)
Creat: 0.96 mg/dL (ref 0.50–1.05)
GFR, Est African American: 79 mL/min/{1.73_m2} (ref >=60)
GFR, Est Non African American: 68 mL/min/{1.73_m2} (ref >=60)
Globulin: 2.1 g/dL (calc) (ref 1.9–3.7)
Glucose, Bld: 102 mg/dL — ABNORMAL HIGH (ref 65–99)
Potassium: 4.2 mmol/L (ref 3.5–5.3)
Sodium: 140 mmol/L (ref 135–146)
Total Bilirubin: 0.4 mg/dL (ref 0.2–1.2)
Total Protein: 6.1 g/dL (ref 6.1–8.1)

## 2020-12-08 ENCOUNTER — Other Ambulatory Visit: Payer: Self-pay

## 2020-12-08 ENCOUNTER — Other Ambulatory Visit: Payer: Self-pay | Admitting: Family Medicine

## 2020-12-08 DIAGNOSIS — Z1231 Encounter for screening mammogram for malignant neoplasm of breast: Secondary | ICD-10-CM

## 2020-12-08 NOTE — Patient Outreach (Signed)
Care Coordination  12/08/2020  Monique [name] 19-Jul-1971 [id number]   Medicaid Managed Care   Unsuccessful Outreach Note  12/08/2020 Name: Monique [name] MRN: [id number] DOB: 04-04-1971  Referred by: [name], MD Reason for referral : High Risk Managed Medicaid (MM Social Work Unsuccessful [employer])   An unsuccessful telephone outreach was attempted today. The patient was referred to the case management team for assistance with care management and care coordination.   Follow Up Plan: The care management team will reach out to the patient again over the next 7 days.   Mickel Fuchs, BSW, [place]  High Risk Managed Medicaid Team

## 2020-12-08 NOTE — Patient Instructions (Addendum)
Visit Information  Monique [name] was given information about Medicaid Managed Care team care coordination services as a part of their [facility] Medicaid benefit. Monique [name] verbally consented to engagement with the [facility] Managed Care team.   For questions related to your [facility] health plan, please call: [phone]  If you would like to schedule transportation through your [facility] plan, please call the following number at least 2 days in advance of your appointment: [phone]   Call the [place] at [phone], at any time, 24 hours a day, 7 days a week. If you are in danger or need immediate medical attention call 911.  Monique [name] - following are the goals we discussed in your visit today:  Goals Addressed   None     Social Worker will follow up in 12/13/20.   Mickel Fuchs, BSW, [place]  High Risk Managed Medicaid Team   Following is a copy of your plan of care:

## 2020-12-08 NOTE — Patient Outreach (Signed)
Medicaid Managed Care Social Work Note  12/08/2020 Name:  Monique [name] MRN:  [id number] DOB:  11-23-1970  Monique [name] is an 50 y.o. year old female who is a primary patient of [name], MD.  The Medicaid Managed Care Coordination team was consulted for assistance with:  [place] and Resources  Monique [name] was given information about Medicaid Managed CareCoordination services today. Monique [name] agreed to services and verbal consent obtained.  Engaged with patient  for by telephone forinitial visit in response to referral for case management and/or care coordination services.   Assessments/Interventions:  Review of past medical history, allergies, medications, health status, including review of consultants reports, laboratory and other test data, was performed as part of comprehensive evaluation and provision of chronic care management services.  SDOH: (Social Determinant of Health) assessments and interventions performed:  BSW spoke with patient about attending therapy, patient stated that was something she was interested in doing. BSW sent a referral to [employer] via fax to [phone]. No other resources needed at this time.  Advanced Directives Status:  Not addressed in this encounter.  Care Plan                 No Known Allergies  Medications Reviewed Today    Reviewed by Carlene Coria, CMA (Certified Medical Assistant) on 12/06/20 at 1519  Med List Status: <None>  Medication Order Taking? Sig Documenting Provider Last Dose Status Informant  ADVAIR DISKUS 250-50 MCG/DOSE AEPB [id number]  Inhale 1 puff into the lungs 2 (two) times daily. [name], MD  Active   albuterol (VENTOLIN HFA) 108 (90 Base) MCG/ACT inhaler [id number]  Inhale 2 puffs into the lungs every 4 (four) hours as needed for wheezing or shortness of breath. [name], MD  Active   Ascorbic Acid (VITAMIN C) 1000 MG tablet [id number]  Take 1,000 mg by mouth daily. [provider]  Active   atorvastatin (LIPITOR) 10 MG tablet [id number]  Take 1 tablet (10 mg total) by mouth daily. [name], MD  Active   betamethasone dipropionate 0.05 % cream [id number]  APPLY TOPICALLY TWICE DAILY TO AFFECTED AREA AS NEEDED [provider]  Active   Calcipotriene-Betameth Diprop (ENSTILAR) 0.005-0.064 % FOAM [id number]  Apply to aa's of the hands and feet BID x 2 weeks. Then decrease use to QD 5d/wk PRN. [name], MD  Active   Calcium Carbonate-Vit D-Min [facility] CALCIUM 1200) 1200-1000 MG-UNIT CHEW [id number]  Chew 1,200 mg by mouth daily with breakfast. Take in combination with vitamin D and magnesium. [name], MD  Active   Crisaborole (EUCRISA) 2 % Kerby Moors [id number]  Apply 1 application topically 2 (two) times daily. [name], MD  Active   DULoxetine (CYMBALTA) 20 MG capsule [id number]  Take 20 mg by mouth in the morning, at noon, and at bedtime. Anabel Bene, MD  Active   ferrous sulfate 324 (65 Fe) MG TBEC [id number]  Take 1 tablet (325 mg total) by mouth daily. [name], MD  Active   IMBRUVICA 420 MG TABS [id number]  TAKE 1 TABLET BY MOUTH DAILY. Lloyd Huger, MD  Active   lidocaine-prilocaine (EMLA) cream [id number]  Apply 1 application topically as needed. [name], MD  Active   loratadine (CLARITIN) 10 MG tablet [id number]  TAKE 1 TABLET BY MOUTH ONCE DAILY IN THE Jessee Avers, MD  Active   Magnesium 500 MG TABS [id number]  Take by mouth daily. [provider]  Active  meloxicam (MOBIC) 15 MG tablet [id number]  Take 1 tablet (15 mg total) by mouth daily. [name], MD  Expired 11/05/20 2359   METROCREAM 0.75 % cream [id number]  Apply thin coat to face twice a day as directed. Moye, [place], MD  Active   mupirocin ointment (BACTROBAN) 2 % [id number] No Apply 1 application topically 2 (two) times daily.  Patient not taking: No sig reported   [name], MD Not Taking Consider  Medication Status and Discontinue   omeprazole (PRILOSEC) 20 MG capsule [id number]  Take 1 capsule (20 mg total) by mouth daily. [name], MD  Active   Oxymetazoline HCl (RHOFADE) 1 % CREA [id number]  Apply a thin coat to the entire face every morning. [name], MD  Active   pregabalin (LYRICA) 100 MG capsule [id number] No Take 100 mg by mouth 3 (three) times daily.  Patient not taking: No sig reported   [provider] Not Taking Consider Medication Status and Discontinue Self  sertraline (ZOLOFT) 100 MG tablet [id number]  TAKE 1 AND 1/2 TABLETS BY MOUTH DAILY Sowles, Drue Stager, MD  Active   sulfamethoxazole-trimethoprim (BACTRIM DS) 800-160 MG tablet [id number] No Take 1 tablet by mouth 2 (two) times daily.  Patient not taking: No sig reported   [name], MD Not Taking Consider Medication Status and Discontinue   tiZANidine (ZANAFLEX) 2 MG tablet [id number]  Take 1 tablet (2 mg total) by mouth every 8 (eight) hours as needed for muscle spasms. [name], MD  Expired 11/05/20 2359   triamcinolone cream (KENALOG) 0.1 % [id number]  Apply 1 application topically 2 (two) times daily. [name], MD  Active   vitamin B-12 (CYANOCOBALAMIN) 500 MCG tablet [id number]  Take 500 mcg by mouth daily. [provider]  Active   Med List Note [name], RPH-CPP 02/04/19 1518): Ibrutinib filled at [place] [facility] 05-13-18 09-29-18 Received permission from [name] to stop Imbruvica 7 days for procedure.          Patient Active Problem List   Diagnosis Date Noted  . Acute right hip pain 06/05/2020  . 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 Bilateral leg pain 11/03/2019  . Iron deficiency anemia 10/15/2019  . Bilateral arm pain 08/05/2019  . Bilateral hand numbness 08/05/2019  . Weakness of both hands 08/05/2019  . Chronic musculoskeletal pain 07/19/2019  . Chronic neuropathic pain 07/19/2019  . 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 Special screening for malignant neoplasms, colon   . Polyp of sigmoid colon   . Hemorrhoids   . Trigeminal neuralgia 10/05/2018  . Lumbar radiculitis (Right) 09/24/2018  . Hypocalcemia 06/03/2018  . Vitamin D insufficiency 06/03/2018  . Abnormal MRI, lumbar spine (06/02/2020) 06/03/2018  . Chronic hip pain (Right) 06/03/2018  . Spondylosis without myelopathy or radiculopathy, lumbar region 06/03/2018  . DDD (degenerative disc disease), lumbar 06/02/2018  . Lumbar facet hypertrophy 06/02/2018  . Lumbar facet arthropathy 06/02/2018  . Lumbar facet syndrome (Bilateral) (R>L) 06/02/2018  . Marijuana use 05/19/2018  . Chronic lower extremity pain (1ry area of Pain) (Bilateral) (R>L) 05/13/2018  . Chronic low back pain (2ry area of Pain) (Bilateral) (R>L) w/ sciatica (Bilateral) 05/13/2018  . Chronic pain syndrome 05/13/2018  . Opiate use 05/13/2018  . Disorder of skeletal system 05/13/2018  . Pharmacologic therapy 05/13/2018  . Problems influencing health status 05/13/2018  . Mastalgia 05/05/2018  . Breast mass, right 01/07/2018  . Face pain 12/11/2017  . Tingling 12/11/2017  . Thoracic  aortic atherosclerosis ([place]) 08/20/2017  . Emphysema of lung ([place]) 08/20/2017  . Adductor tendinitis 04/23/2017  . Trochanteric bursitis of right hip 04/23/2017  . GERD without esophagitis 12/11/2016  . CLL (chronic lymphocytic leukemia) ([place]) 11/24/2016  . Pap smear abnormality of cervix/human papillomavirus (HPV) positive 09/12/2016  . Stress incontinence 09/04/2016  . B12 deficiency 07/10/2015  . Insomnia, persistent 07/10/2015  . Anorexia nervosa, restricting type 07/10/2015  . Anxiety, generalized 07/10/2015  . H/O suicide attempt 07/10/2015  . Lymphocytosis 07/10/2015  . Obsessive-compulsive disorder 07/10/2015  . Tobacco use 07/10/2015  . History of cervical dysplasia 07/18/2014    Conditions to be addressed/monitored per PCP order:  Anxiety and Depression  There are no  care plans that you recently modified to display for this patient.   Follow up:  Patient agrees to Care Plan and Follow-up.  Plan: The Managed Medicaid care management team will reach out to the patient again over the next 14 days.  Date/time of next scheduled Social Work care management/care coordination outreach:  12/19/20  Mickel Fuchs, [place], [place]  High Risk Managed Medicaid Team

## 2020-12-12 ENCOUNTER — Other Ambulatory Visit: Payer: Self-pay

## 2020-12-12 ENCOUNTER — Other Ambulatory Visit: Payer: Self-pay | Admitting: Family Medicine

## 2020-12-12 DIAGNOSIS — J432 Centrilobular emphysema: Secondary | ICD-10-CM

## 2020-12-12 DIAGNOSIS — G500 Trigeminal neuralgia: Secondary | ICD-10-CM

## 2020-12-12 DIAGNOSIS — F330 Major depressive disorder, recurrent, mild: Secondary | ICD-10-CM

## 2020-12-12 DIAGNOSIS — E611 Iron deficiency: Secondary | ICD-10-CM

## 2020-12-12 DIAGNOSIS — K219 Gastro-esophageal reflux disease without esophagitis: Secondary | ICD-10-CM

## 2020-12-12 DIAGNOSIS — I700 Atherosclerosis of aorta: Secondary | ICD-10-CM

## 2020-12-12 DIAGNOSIS — L299 Pruritus, unspecified: Secondary | ICD-10-CM

## 2020-12-12 DIAGNOSIS — F419 Anxiety disorder, unspecified: Secondary | ICD-10-CM

## 2020-12-12 DIAGNOSIS — E538 Deficiency of other specified B group vitamins: Secondary | ICD-10-CM

## 2020-12-12 MED ORDER — ATORVASTATIN CALCIUM 10 MG PO TABS: 10.0000 mg | ORAL_TABLET | Freq: Every day | ORAL | 1 refills | Status: DC

## 2020-12-12 MED ORDER — ADVAIR DISKUS 250-50 MCG/DOSE IN AEPB: 1.0000 | INHALATION_SPRAY | Freq: Two times a day (BID) | RESPIRATORY_TRACT | 1 refills | Status: DC

## 2020-12-12 MED ORDER — LIDOCAINE-PRILOCAINE 2.5-2.5 % EX CREA: 1.0000 "application " | TOPICAL_CREAM | CUTANEOUS | 1 refills | Status: DC | PRN

## 2020-12-12 MED ORDER — DULOXETINE HCL 60 MG PO CPEP: 60.0000 mg | ORAL_CAPSULE | Freq: Every day | ORAL | 0 refills | Status: DC

## 2020-12-12 MED ORDER — LORATADINE 10 MG PO TABS: 10.0000 mg | ORAL_TABLET | Freq: Every morning | ORAL | 3 refills | Status: DC

## 2020-12-12 MED ORDER — ALBUTEROL SULFATE HFA 108 (90 BASE) MCG/ACT IN AERS: 2.0000 | INHALATION_SPRAY | RESPIRATORY_TRACT | 0 refills | Status: DC | PRN

## 2020-12-12 MED ORDER — B-12 500 MCG SL SUBL: 1.0000 | SUBLINGUAL_TABLET | Freq: Every day | SUBLINGUAL | 1 refills | Status: DC

## 2020-12-12 MED ORDER — FERROUS SULFATE 324 (65 FE) MG PO TBEC: 1.0000 | DELAYED_RELEASE_TABLET | Freq: Every day | ORAL | 1 refills | Status: DC

## 2020-12-12 MED ORDER — OMEPRAZOLE 20 MG PO CPDR: 20.0000 mg | DELAYED_RELEASE_CAPSULE | Freq: Every day | ORAL | 1 refills | Status: DC

## 2020-12-12 NOTE — Patient Outreach (Signed)
Called patient to make sure she got Lyrica. She did and she also asked if I could do this all the time for her. I informed her I could.  Verbal consent obtained for [facility] enhanced pharmacy services (medication synchronization, adherence packaging, delivery coordination). A medication sync plan was created to allow patient to get all medications delivered once every 30 to 90 days per patient preference. Patient understands they have freedom to choose pharmacy and clinical pharmacist will coordinate care between all prescribers and [facility].   Coordinated with Pharmacy, Providers, and [facility] to organize medications for synchronization.

## 2020-12-13 ENCOUNTER — Ambulatory Visit (INDEPENDENT_AMBULATORY_CARE_PROVIDER_SITE_OTHER): Payer: Medicaid Other | Admitting: Obstetrics and Gynecology

## 2020-12-13 ENCOUNTER — Encounter: Payer: Self-pay | Admitting: Obstetrics and Gynecology

## 2020-12-13 ENCOUNTER — Other Ambulatory Visit: Payer: Self-pay

## 2020-12-13 VITALS — BP 107/69 | HR 85 | Ht 67.0 in | Wt 172.4 lb

## 2020-12-13 DIAGNOSIS — N951 Menopausal and female climacteric states: Secondary | ICD-10-CM

## 2020-12-13 DIAGNOSIS — F419 Anxiety disorder, unspecified: Secondary | ICD-10-CM

## 2020-12-13 DIAGNOSIS — Z87410 Personal history of cervical dysplasia: Secondary | ICD-10-CM | POA: Diagnosis not present

## 2020-12-13 DIAGNOSIS — C9110 Chronic lymphocytic leukemia of B-cell type not having achieved remission: Secondary | ICD-10-CM

## 2020-12-13 NOTE — Progress Notes (Signed)
Pt present to discuss having an hysterectomy.

## 2020-12-13 NOTE — Patient Instructions (Addendum)
Vaginal Hysterectomy  A vaginal hysterectomy is a procedure to remove all or part of the uterus through a small incision in the vagina. In this procedure, your health care provider may remove your entire uterus, including the cervix. The cervix is the opening and bottom part of the uterus and is located between the vagina and the uterus. Sometimes, the ovaries and fallopian tubes are also removed. This surgery may be done to treat problems such as:  Noncancerous growths in the uterus (uterine fibroids) that cause symptoms.  A condition that causes the lining of the uterus to grow in other areas (endometriosis).  Problems with pelvic support.  Cancer of the cervix, ovaries, uterus, or tissue that lines the uterus (endometrium).  Excessive bleeding in the uterus. When removing your uterus, your health care provider may also remove the ovaries and the fallopian tubes. After this procedure, you will no longer be able to have a baby, and you will no longer have a menstrual period. Tell a health care provider about:  Any allergies you have.  All medicines you are taking, including vitamins, herbs, eye drops, creams, and over-the-counter medicines.  Any problems you or family members have had with anesthetic medicines.  Any blood disorders you have.  Any surgeries you have had.  Any medical conditions you have.  Whether you are pregnant or may be pregnant. What are the risks? Generally, this is a safe procedure. However, problems may occur, including:  Bleeding.  Infection.  Blood clots in the legs or lungs.  Damage to nearby structures or organs.  Pain during sex.  Allergic reactions to medicines. What happens before the procedure? Staying hydrated Follow instructions from your health care provider about hydration, which may include:  Up to 2 hours before the procedure - you may continue to drink clear liquids, such as water, clear fruit juice, black coffee, and plain tea.    Eating and drinking restrictions Follow instructions from your health care provider about eating and drinking, which may include:  8 hours before the procedure - stop eating heavy meals or foods, such as meat, fried foods, or fatty foods.  6 hours before the procedure - stop eating light meals or foods, such as toast or cereal.  6 hours before the procedure - stop drinking milk or drinks that contain milk.  2 hours before the procedure - stop drinking clear liquids. Medicines  Ask your health care provider about: ? Changing or stopping your regular medicines. This is especially important if you are taking diabetes medicines or blood thinners. ? Taking medicines such as aspirin and ibuprofen. These medicines can thin your blood. Do not take these medicines unless your health care provider tells you to take them. ? Taking over-the-counter medicines, vitamins, herbs, and supplements.  You may be asked to take a medicine to empty your colon (bowel preparation). General instructions  If you were asked to do a bowel preparation before the procedure, follow instructions from your health care provider.  This procedure can affect the way you feel about yourself. Talk with your health care provider about the physical and emotional changes hysterectomy may cause.  Do not use any products that contain nicotine or tobacco for at least 4 weeks before the procedure. These products include cigarettes, e-cigarettes, and chewing tobacco. If you need help quitting, ask your health care provider.  Plan to have a responsible adult take you home from the hospital or clinic.  Plan to have a responsible adult care for you for  the time you are told after you leave the hospital or clinic. This is important. Surgery safety Ask your health care provider:  How your surgery site will be marked.  What steps will be taken to help prevent infection. These may include: ? Removing hair at the surgery  site. ? Washing skin with a germ-killing soap. ? Receiving antibiotic medicine. What happens during the procedure?  An IV will be inserted into one of your veins.  You will be given one or more of the following: ? A medicine to help you relax (sedative). ? A medicine to numb the area (local anesthetic). ? A medicine to make you fall asleep (general anesthetic). ? A medicine that is injected into your spine to numb the area below and slightly above the injection site (spinal anesthetic). ? A medicine that is injected into an area of your body to numb everything below the injection site (regional anesthetic).  Your surgeon will make an incision in your vagina.  Your surgeon will locate and remove all or part of your uterus. Part or all of the uterus will be removed through the vagina.  Your ovaries and fallopian tubes may be removed at the same time.  The incision in your vagina will be closed with stitches (sutures) that dissolve over time. The procedure may vary among health care providers and hospitals. What happens after the procedure?  Your blood pressure, heart rate, breathing rate, and blood oxygen level will be monitored until you leave the hospital or clinic.  You will be encouraged to walk as soon as possible. You will also use a device or do breathing exercises to keep your lungs clear.  You may have to wear compression stockings. These stockings help to prevent blood clots and reduce swelling in your legs.  You will be given pain medicine as needed.  You will need to wear a sanitary pad for vaginal discharge or bleeding. Summary  A vaginal hysterectomy is a procedure to remove all or part of the uterus through the vagina.  You may need a vaginal hysterectomy to treat a variety of abnormalities of the uterus.  Plan to have a responsible adult take you home from the hospital or clinic.  Plan to have a responsible adult care for you for the time you are told after you  leave the hospital or clinic. This is important. This information is not intended to replace advice given to you by your health care provider. Make sure you discuss any questions you have with your health care provider. Document Revised: 05/05/2020 Document Reviewed: 05/05/2020 Elsevier Patient Education  [place].

## 2020-12-13 NOTE — Progress Notes (Signed)
GYNECOLOGY PROGRESS NOTE  Subjective:    Patient ID: Monique [name], female    DOB: Feb 08, 1971, 50 y.o.   MRN: [id number]  HPI  Patient is a 50 y.o. female who presents for discussion of definitive surgical management with hysterectomy. She has a long-standing history of abnormal pap smears (~ 20 years intermittently), also with history of a LEEP.  She reports that she has grown tired of the continuous follow up of her abnormal pap smears, and is developing anxiety about the possibility of this progressing to cancer as she has already been diagnosed in the past with a different type of cancer (CLL). Also has been dealing with call backs for abnormal mammograms over the past few years. She would like to reduce her risk.   Pap History:  10/26/2020: LGSIL.   08/31/2019: LGSIL. 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  09/12/2016: ASCUS HPV+. Colposcopy performed 10/17/2016 with benign ECC.  08/16/2015: LGSIL. Negative biopsy.   Patient also desires to report having frequent hot flashes and night sweats.  She has been experiencing these over the past 2-3 years. Has not tried anything for them and has just been dealing with them. Reports ~ 15-20 flashes per day.   The following portions of the patient's history were reviewed and updated as appropriate: allergies, current medications, past family history, past medical history, past social history, past surgical history and problem list.  Review of Systems Pertinent items noted in HPI and remainder of comprehensive ROS otherwise negative.   Objective:   Blood pressure 107/69, pulse 85, height 5\' 7"  (1.702 m), weight 172 lb 6.4 oz (78.2 kg). No LMP recorded. Patient is perimenopausal.  General appearance: alert and no distress See H&P for details of exam.    Assessment:   Cervical dysplasia (moderate) History of cancer (CLL) Anxiety Menopausal asomotor symptoms.  Plan:   - Patient desires  definitive management with hysterectomy. Discussed that in general definitive management is typically offered with persistent severe dysplasia, however in light of increasing anxiety with prior history of anxiety and depression and fear of development of another cancer as she has already been previously diagnosed with CLL in the past, can have discussion today.  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 Patient has a history of LEEP in the past. Would just like to proceed with definitive management.  - Patient desires definitive management with hysterectomy.  I proposed doing a  total vaginal hysterectomy (TVH) and prophylactic bilateral salpingectomy.  No indication for oophorectomy at this time.  Patient agrees with this proposed surgery.  The risks of surgery were discussed in detail with the patient including but not limited to: bleeding which may require transfusion or reoperation; infection which may require antibiotics; injury to bowel, bladder, ureters or other surrounding organs; need for additional procedures including laparotomy or subsequent procedures secondary to abnormal pathology; formation of adhesions; thromboembolic phenomenon; incisional problems and other postoperative/anesthesia complications.  Patient was also advised that she will remain in house for 1 night; and expected recovery time after a hysterectomy is 6-8 weeks.  Patient was told that the likelihood that her condition and symptoms will be treated effectively with this surgical management was very high; the postoperative expectations were also discussed in detail. The patient also understands the alternative treatment options which were discussed  in full. All questions were answered.  She was told that she will be contacted by our surgical scheduler regarding the time and date of her surgery; routine preoperative  instructions will be given to her by the preoperative nursing team.   She is aware of need for preoperative COVID testing and subsequent quarantine from time of test to time of surgery; she will be given further preoperative instructions at that [place] screening visit.   Routine postoperative instructions will be reviewed with the patient in detail after surgery. Printed patient education handouts about the procedure was given to the patient to review at home.  Will schedule surgery in the next 2-3 weeks.  - Patient with bothersome menopausal vasomotor symptoms. Discussed lifestyle interventions such as wearing light clothing, remaining in cool environments, having fan/air conditioner in the room, avoiding hot beverages etc.  Discussed using hormone therapy and concerns about increased risk of heart disease, cerebrovascular disease, thromboembolic disease,  and breast cancer.  Also discussed other medical options such as Paxil, Effexor, Brisdelle, Clonidine,  or Neurontin.   Also discussed alternative therapies such as herbal remedies but cautioned that most of the products contained phytoestrogens (plant estrogens) in unregulated amounts which can have the same effects on the body as the pharmaceutical estrogen preparations.  Also referred her to www.[email or web address].org for other alternative options.  Patient opted for estrogen therapy (patient would really need combined estrogen/progestin therapy however will have a hysterectomy in a few weeks and will no longer require endometrial protection). Given option to try the oral or transdermal formulation (patch or gel). Given samples of Divigel. Will follow up at time of surgery and subsequent post-operative visits.    A total of 25 minutes were spent face-to-face with the patient during this encounter and over half of that time dealt with counseling and coordination of care.   Rubie Maid, MD [facility]

## 2020-12-13 NOTE — Patient Outreach (Addendum)
Medicaid Managed Care Social Work Note  12/13/2020 Name:  Monique [name] MRN:  [id number] DOB:  09/30/1970  Monique [name] is an 50 y.o. year old female who is a primary patient of Monique Sizer, MD.  The Medicaid Managed Care Coordination team was consulted for assistance with:  [place] and Resources  [name] was given information about Medicaid Managed CareCoordination services today. [name] agreed to services and verbal consent obtained.  Engaged with patient  for Other:referral check forfollow up visit in response to referral for case management and/or care coordination services.   Assessments/Interventions:  Review of past medical history, allergies, medications, health status, including review of consultants reports, laboratory and other test data, was performed as part of comprehensive evaluation and provision of chronic care management services.  SDOH: (Social Determinant of Health) assessments and interventions performed:  BSW contact [employer] to check on referral sent on 12/08/20. BSW was unable to leave a voicemail.   Advanced Directives Status:  Not addressed in this encounter.  Care Plan                 No Known Allergies  Medications Reviewed Today    Reviewed by Monique Sizer, MD (Physician) on 12/12/20 at 1646  Med List Status: <None>  Medication Order Taking? Sig Documenting Provider Last Dose Status Informant  ADVAIR DISKUS 250-50 MCG/DOSE AEPB [id number] No Inhale 1 puff into the lungs 2 (two) times daily. Monique Sizer, MD Taking Active   albuterol (VENTOLIN HFA) 108 (90 Base) MCG/ACT inhaler [id number] No Inhale 2 puffs into the lungs every 4 (four) hours as needed for wheezing or shortness of breath. Monique Sizer, MD Taking Active   Ascorbic Acid (VITAMIN C) 1000 MG tablet [id number] No Take 1,000 mg by mouth daily. [provider] Taking Active   atorvastatin (LIPITOR) 10 MG tablet [id number] No Take 1 tablet (10 mg  total) by mouth daily. Monique Sizer, MD Taking Active   betamethasone dipropionate 0.05 % cream [id number] No APPLY TOPICALLY TWICE DAILY TO AFFECTED AREA AS NEEDED [provider] Taking Active   Calcipotriene-Betameth Diprop (ENSTILAR) 0.005-0.064 % FOAM [id number] No Apply to aa's of the hands and feet BID x 2 weeks. Then decrease use to QD 5d/wk PRN. [name], MD Taking Active   Calcium Carbonate-Vit D-Min [facility] CALCIUM 1200) 1200-1000 MG-UNIT CHEW [id number] No Chew 1,200 mg by mouth daily with breakfast. Take in combination with vitamin D and magnesium. [name], MD Taking Active   Crisaborole [facility]) 2 % OINT [id number] No Apply 1 application topically 2 (two) times daily. [name], MD Taking Active   DULoxetine (CYMBALTA) 20 MG capsule [id number] No Take 20 mg by mouth in the morning, at noon, and at bedtime. Anabel Bene, MD Taking Active   ferrous sulfate 324 (65 Fe) MG TBEC [id number] No Take 1 tablet (325 mg total) by mouth daily. Monique Sizer, MD Taking Active   IMBRUVICA 420 MG TABS [id number] No TAKE 1 TABLET BY MOUTH DAILY. Lloyd Huger, MD Taking Active   lidocaine-prilocaine (EMLA) cream [id number] No Apply 1 application topically as needed. Monique Sizer, MD Taking Active   loratadine (CLARITIN) 10 MG tablet [id number] No TAKE 1 TABLET BY MOUTH ONCE DAILY IN THE Dyanne Iha, Drue Stager, MD Taking Active   Magnesium 500 MG TABS [id number] No Take by mouth daily. [provider] Taking Active   meloxicam (MOBIC) 15 MG tablet [id number] No Take 1 tablet (15 mg total) by mouth  daily. [name], MD Taking Expired 11/05/20 2359   omeprazole (PRILOSEC) 20 MG capsule [id number] No Take 1 capsule (20 mg total) by mouth daily. Monique Sizer, MD Taking Active   Oxymetazoline HCl (RHOFADE) 1 % CREA [id number] No Apply a thin coat to the entire face every morning. [name], MD Taking Active   pregabalin (LYRICA) 100 MG  capsule [id number] No Take 100 mg by mouth 3 (three) times daily.  Patient not taking: No sig reported   [provider] Not Taking Active Self        Discontinued 12/12/20 1646 (Change in therapy)   tiZANidine (ZANAFLEX) 2 MG tablet [id number] No Take 1 tablet (2 mg total) by mouth every 8 (eight) hours as needed for muscle spasms. [name], MD Taking Expired 11/05/20 2359   triamcinolone cream (KENALOG) 0.1 % [id number] No Apply 1 application topically 2 (two) times daily. Monique Sizer, MD Taking Active   vitamin B-12 (CYANOCOBALAMIN) 500 MCG tablet [id number] No Take 500 mcg by mouth daily. [provider] Taking Active   Med List Note [name], RPH-CPP 02/04/19 1518): Ibrutinib filled at [facility] 05-13-18 09-29-18 Received permission from [name] to stop Imbruvica 7 days for procedure.          Patient Active Problem List   Diagnosis Date Noted  . Acute right hip pain 06/05/2020  . 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 Bilateral leg pain 11/03/2019  . Iron deficiency anemia 10/15/2019  . Bilateral arm pain 08/05/2019  . Bilateral hand numbness 08/05/2019  . Weakness of both hands 08/05/2019  . Chronic musculoskeletal pain 07/19/2019  . Chronic neuropathic pain 07/19/2019  . 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 Special screening for malignant neoplasms, colon   . Polyp of sigmoid colon   . Hemorrhoids   . Trigeminal neuralgia 10/05/2018  . Lumbar radiculitis (Right) 09/24/2018  . Hypocalcemia 06/03/2018  . Vitamin D insufficiency 06/03/2018  . Abnormal MRI, lumbar spine (06/02/2020) 06/03/2018  . Chronic hip pain (Right) 06/03/2018  . Spondylosis without myelopathy or radiculopathy, lumbar region 06/03/2018  . DDD (degenerative disc disease), lumbar 06/02/2018  . Lumbar facet hypertrophy 06/02/2018  . Lumbar facet arthropathy 06/02/2018  . Lumbar facet syndrome (Bilateral) (R>L) 06/02/2018  .  Marijuana use 05/19/2018  . Chronic lower extremity pain (1ry area of Pain) (Bilateral) (R>L) 05/13/2018  . Chronic low back pain (2ry area of Pain) (Bilateral) (R>L) w/ sciatica (Bilateral) 05/13/2018  . Chronic pain syndrome 05/13/2018  . Opiate use 05/13/2018  . Disorder of skeletal system 05/13/2018  . Pharmacologic therapy 05/13/2018  . Problems influencing health status 05/13/2018  . Mastalgia 05/05/2018  . Breast mass, right 01/07/2018  . Face pain 12/11/2017  . Tingling 12/11/2017  . Thoracic aortic atherosclerosis ([place]) 08/20/2017  . Emphysema of lung ([place]) 08/20/2017  . Adductor tendinitis 04/23/2017  . Trochanteric bursitis of right hip 04/23/2017  . GERD without esophagitis 12/11/2016  . CLL (chronic lymphocytic leukemia) ([place]) 11/24/2016  . Pap smear abnormality of cervix/human papillomavirus (HPV) positive 09/12/2016  . Stress incontinence 09/04/2016  . B12 deficiency 07/10/2015  . Insomnia, persistent 07/10/2015  . Anorexia nervosa, restricting type 07/10/2015  . Anxiety, generalized 07/10/2015  . H/O suicide attempt 07/10/2015  . Lymphocytosis 07/10/2015  . Obsessive-compulsive disorder 07/10/2015  . Tobacco use 07/10/2015  . History of cervical dysplasia 07/18/2014    Conditions to be addressed/monitored per PCP order:  Anxiety and Depression  There are no care plans that  you recently modified to display for this patient.   Follow up:  Patient agrees to Care Plan and Follow-up.  Plan: The Managed Medicaid care management team will reach out to the patient again over the next 14 days.    Mickel Fuchs, BSW, [place]  High Risk Managed Medicaid Team

## 2020-12-14 ENCOUNTER — Other Ambulatory Visit (HOSPITAL_COMMUNITY): Payer: Self-pay

## 2020-12-16 ENCOUNTER — Other Ambulatory Visit (HOSPITAL_COMMUNITY): Payer: Self-pay

## 2020-12-16 MED FILL — Ibrutinib Tab 420 MG: ORAL | 28 days supply | Qty: 28 | Fill #0 | Status: AC

## 2020-12-17 ENCOUNTER — Encounter: Payer: Self-pay | Admitting: Obstetrics and Gynecology
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
## 2020-12-18 ENCOUNTER — Other Ambulatory Visit (HOSPITAL_COMMUNITY): Payer: Self-pay

## 2020-12-19 ENCOUNTER — Other Ambulatory Visit (HOSPITAL_COMMUNITY): Payer: Self-pay

## 2020-12-19 ENCOUNTER — Telehealth: Payer: Self-pay | Admitting: *Deleted

## 2020-12-19 ENCOUNTER — Ambulatory Visit: Payer: Medicaid Other

## 2020-12-19 NOTE — Telephone Encounter (Signed)
   Telephone encounter was:  Successful.  12/19/2020 Name: [name] MRN: [id number] DOB: [birth year]  [name] is a 51 y.o. year old female who is a primary care patient of [name], MD . The community resource team was consulted for assistance with massage eqiupment   Care guide performed the following interventions: Patient provided with information about care guide support team and interviewed to confirm resource needs Follow up call placed to community resources to determine status of patients referral.  Follow Up Plan:  Care guide will outreach resources to assist patient with massage equipment  [place], Care Management  [phone] [address] , [place] [address] Email : [email or web address]

## 2020-12-19 NOTE — Telephone Encounter (Signed)
   Telephone encounter was:  Successful.  12/19/2020 Name: Monique [name] MRN: [id number] DOB: 1971-06-12  Monique [name] is a 50 y.o. year old female who is a primary care patient of [name], MD . The community resource team was consulted for assistance with equipment used for pain control  Care guide performed the following interventions: Patient provided with information about care guide support team and interviewed to confirm resource needs Follow up call placed to community resources to determine status of patients referral Follow up call placed to the patient to discuss status of referral.  Follow Up Plan:  Client will call back when she finds something  [place], Care Management  [phone] [address] , [place] [address] Email : [email or web address]

## 2020-12-20 MED ORDER — DIVIGEL 1.25 MG/1.25GM TD GEL: 1.0000 "application " | Freq: Every morning | TRANSDERMAL | 11 refills | Status: DC

## 2020-12-20 NOTE — Telephone Encounter (Signed)
Please advise 

## 2020-12-21 ENCOUNTER — Other Ambulatory Visit
Admission: RE | Admit: 2020-12-21 | Discharge: 2020-12-21 | Disposition: A | Discharge status: Home / Self Care | Payer: Medicaid Other | Source: Ambulatory Visit | Attending: Obstetrics and Gynecology | Admitting: Obstetrics and Gynecology

## 2020-12-21 ENCOUNTER — Other Ambulatory Visit: Payer: Self-pay

## 2020-12-21 DIAGNOSIS — Z20822 Contact with and (suspected) exposure to covid-19: Secondary | ICD-10-CM | POA: Diagnosis not present

## 2020-12-21 DIAGNOSIS — Z01818 Encounter for other preprocedural examination: Secondary | ICD-10-CM | POA: Diagnosis not present

## 2020-12-21 NOTE — Patient Instructions (Addendum)
Your procedure is scheduled on:12/25/20- Monday Report to the Registration Desk on the 1st floor of the [facility]. To find out your arrival time, please call [phone] between 1PM - 3PM on: 12/22/20 - Friday  REMEMBER: Instructions that are not followed completely may result in serious medical risk, up to and including death; or upon the discretion of your surgeon and anesthesiologist your surgery may need to be rescheduled.  Do not eat food after midnight the night before surgery.  No gum chewing, lozengers or hard candies.  You may however, drink CLEAR liquids up to 2 hours before you are scheduled to arrive for your surgery. Do not drink anything within 2 hours of your scheduled arrival time.  Clear liquids include: - water  - apple juice without pulp - gatorade (not RED, PURPLE, OR BLUE) - black coffee or tea (Do NOT add milk or creamers to the coffee or tea) Do NOT drink anything that is not on this list.  Type 1 and Type 2 diabetics should only drink water.  In addition, your doctor has ordered for you to drink the provided  Ensure Pre-Surgery Clear Carbohydrate Drink  Drinking this carbohydrate drink up to two hours before surgery helps to reduce insulin resistance and improve patient outcomes. Please complete drinking 2 hours prior to scheduled arrival time.  TAKE THESE MEDICATIONS THE MORNING OF SURGERY WITH A SIP OF WATER: - Magnesium 500 MG CAPS   - ADVAIR DISKUS 250-50 MCG/DOSE AEPB - atorvastatin (LIPITOR) 10 MG tablet - DULoxetine (CYMBALTA) 20 MG capsule - ibrutinib 420 MG TABS - loratadine (CLARITIN) 10 MG tablet - omeprazole (PRILOSEC) 20 MG capsule, take one the night before and one on the morning of surgery - helps to prevent nausea after surgery. - pregabalin (LYRICA) 100 MG capsule - sertraline (ZOLOFT) 100 MG tablet  Use inhaler albuterol (VENTOLIN HFA) 108 (90 Base) MCG/ACT inhaler on the day of surgery and bring to the hospital.  One week prior  to surgery: Stop taking beginning 12/21/20 - meloxicam (MOBIC) 15 MG tablet Stop Anti-inflammatories (NSAIDS) such as Advil, Aleve, Ibuprofen, Motrin, Naproxen, Naprosyn and Aspirin based products such as Excedrin, Goodys Powder, BC Powder.  Stop ANY OVER THE COUNTER supplements until after surgery.  No Alcohol for 24 hours before or after surgery.  No Smoking including e-cigarettes for 24 hours prior to surgery.  No chewable tobacco products for at least 6 hours prior to surgery.  No nicotine patches on the day of surgery.  Do not use any "recreational" drugs for at least a week prior to your surgery.  Please be advised that the combination of cocaine and anesthesia may have negative outcomes, up to and including death. If you test positive for cocaine, your surgery will be cancelled.  On the morning of surgery brush your teeth with toothpaste and water, you may rinse your mouth with mouthwash if you wish. Do not swallow any toothpaste or mouthwash.  Do not wear jewelry, make-up, hairpins, clips or nail polish.  Do not wear lotions, powders, or perfumes.   Do not shave body from the neck down 48 hours prior to surgery just in case you cut yourself which could leave a site for infection.  Also, freshly shaved skin may become irritated if using the CHG soap.  Contact lenses, hearing aids and dentures may not be worn into surgery.  Do not bring valuables to the hospital. [facility] is not responsible for any missing/lost belongings or valuables.   Use CHG  Soap or wipes as directed on instruction sheet.  Notify your doctor if there is any change in your medical condition (cold, fever, infection).  Wear comfortable clothing (specific to your surgery type) to the hospital.  Plan for stool softeners for home use; pain medications have a tendency to cause constipation. You can also help prevent constipation by eating foods high in fiber such as fruits and vegetables and drinking plenty  of fluids as your diet allows.  After surgery, you can help prevent lung complications by doing breathing exercises.  Take deep breaths and cough every 1-2 hours. Your doctor may order a device called an Incentive Spirometer to help you take deep breaths. When coughing or sneezing, hold a pillow firmly against your incision with both hands. This is called "splinting." Doing this helps protect your incision. It also decreases belly discomfort.  If you are being admitted to the hospital overnight, leave your suitcase in the car. After surgery it may be brought to your room.  If you are being discharged the day of surgery, you will not be allowed to drive home. You will need a responsible adult (18 years or older) to drive you home and stay with you that night.   If you are taking public transportation, you will need to have a responsible adult (18 years or older) with you. Please confirm with your physician that it is acceptable to use public transportation.   Please call the [facility] Dept. at [phone] if you have any questions about these instructions.  Surgery Visitation Policy:  Patients undergoing a surgery or procedure may have one family member or support person with them as long as that person is not COVID-19 positive or experiencing its symptoms.  That person may remain in the waiting area during the procedure.  Inpatient Visitation:    Visiting hours are 7 a.m. to 8 p.m. Inpatients will be allowed two visitors daily. The visitors may change each day during the patient's stay. No visitors under the age of 22. Any visitor under the age of 81 must be accompanied by an adult. The visitor must pass COVID-19 screenings, use hand sanitizer when entering and exiting the patient's room and wear a mask at all times, including in the patient's room. Patients must also wear a mask when staff or their visitor are in the room. Masking is required regardless of vaccination  status.

## 2020-12-21 NOTE — Patient Instructions (Signed)
Your procedure is scheduled on: 12/25/20-  Monday Report to the Registration Desk on the 1st floor of the [facility]. To find out your arrival time, please call [phone] between 1PM - 3PM on: 12/22/20 Friday  REMEMBER: Instructions that are not followed completely may result in serious medical risk, up to and including death; or upon the discretion of your surgeon and anesthesiologist your surgery may need to be rescheduled.  Do not eat food after midnight the night before surgery.  No gum chewing, lozengers or hard candies.  You may however, drink CLEAR liquids up to 2 hours before you are scheduled to arrive for your surgery. Do not drink anything within 2 hours of your scheduled arrival time.  Clear liquids include: - water  - apple juice without pulp - gatorade (not RED, PURPLE, OR BLUE) - black coffee or tea (Do NOT add milk or creamers to the coffee or tea) Do NOT drink anything that is not on this list.  Type 1 and Type 2 diabetics should only drink water.  In addition, your doctor has ordered for you to drink the provided  Ensure Pre-Surgery Clear Carbohydrate Drink Drinking this carbohydrate drink up to two hours before surgery helps to reduce insulin resistance and improve patient outcomes. Please complete drinking 2 hours prior to scheduled arrival time.  TAKE THESE MEDICATIONS THE MORNING OF SURGERY WITH A SIP OF WATER: - ADVAIR DISKUS 250-50 MCG/DOSE AEPB - atorvastatin (LIPITOR) 10 MG tablet - DULoxetine (CYMBALTA) 20 MG capsule - ibrutinib 420 MG TABS - Magnesium 500 MG CAPS - omeprazole (PRILOSEC) 20 MG capsule, take one the night before and one on the morning of surgery - helps to prevent nausea after surgery. - loratadine (CLARITIN) 10 MG tablet - pregabalin (LYRICA) 100 MG capsule - sertraline (ZOLOFT) 100 MG tablet  Use inhaler albuterol (VENTOLIN HFA) 108 (90 Base) MCG/ACT inhaleron the day of surgery and bring to the hospital.  One week prior to  surgery: Stop taking beginning 12/21/20 - meloxicam (MOBIC) 15 MG tablet Stop Anti-inflammatories (NSAIDS) such as Advil, Aleve, Ibuprofen, Motrin, Naproxen, Naprosyn and Aspirin based products such as Excedrin, Goodys Powder, BC Powder.  Stop ANY OVER THE COUNTER supplements until after surgery.  No Alcohol for 24 hours before or after surgery.  No Smoking including e-cigarettes for 24 hours prior to surgery.  No chewable tobacco products for at least 6 hours prior to surgery.  No nicotine patches on the day of surgery.  Do not use any "recreational" drugs for at least a week prior to your surgery.  Please be advised that the combination of cocaine and anesthesia may have negative outcomes, up to and including death. If you test positive for cocaine, your surgery will be cancelled.  On the morning of surgery brush your teeth with toothpaste and water, you may rinse your mouth with mouthwash if you wish. Do not swallow any toothpaste or mouthwash.  Do not wear jewelry, make-up, hairpins, clips or nail polish.  Do not wear lotions, powders, or perfumes.   Do not shave body from the neck down 48 hours prior to surgery just in case you cut yourself which could leave a site for infection.  Also, freshly shaved skin may become irritated if using the CHG soap.  Contact lenses, hearing aids and dentures may not be worn into surgery.  Do not bring valuables to the hospital. [facility] is not responsible for any missing/lost belongings or valuables.   Use CHG Soap or wipes  as directed on instruction sheet.  Notify your doctor if there is any change in your medical condition (cold, fever, infection).  Wear comfortable clothing (specific to your surgery type) to the hospital.  Plan for stool softeners for home use; pain medications have a tendency to cause constipation. You can also help prevent constipation by eating foods high in fiber such as fruits and vegetables and drinking plenty of  fluids as your diet allows.  After surgery, you can help prevent lung complications by doing breathing exercises.  Take deep breaths and cough every 1-2 hours. Your doctor may order a device called an Incentive Spirometer to help you take deep breaths. When coughing or sneezing, hold a pillow firmly against your incision with both hands. This is called "splinting." Doing this helps protect your incision. It also decreases belly discomfort.  If you are being admitted to the hospital overnight, leave your suitcase in the car. After surgery it may be brought to your room.  If you are being discharged the day of surgery, you will not be allowed to drive home. You will need a responsible adult (18 years or older) to drive you home and stay with you that night.   If you are taking public transportation, you will need to have a responsible adult (18 years or older) with you. Please confirm with your physician that it is acceptable to use public transportation.   Please call the [facility] at [phone] if you have any questions about these instructions.  Surgery Visitation Policy:  Patients undergoing a surgery or procedure may have one family member or support person with them as long as that person is not COVID-19 positive or experiencing its symptoms.  That person may remain in the waiting area during the procedure.  Inpatient Visitation:    Visiting hours are 7 a.m. to 8 p.m. Inpatients will be allowed two visitors daily. The visitors may change each day during the patient's stay. No visitors under the age of 39. Any visitor under the age of 88 must be accompanied by an adult. The visitor must pass COVID-19 screenings, use hand sanitizer when entering and exiting the patient's room and wear a mask at all times, including in the patient's room. Patients must also wear a mask when staff or their visitor are in the room. Masking is required regardless of vaccination  status.

## 2020-12-22 ENCOUNTER — Other Ambulatory Visit
Admission: RE | Admit: 2020-12-22 | Discharge: 2020-12-22 | Disposition: A | Discharge status: Home / Self Care | Payer: Medicaid Other | Source: Ambulatory Visit | Attending: Obstetrics and Gynecology | Admitting: Obstetrics and Gynecology

## 2020-12-22 ENCOUNTER — Inpatient Hospital Stay
Admission: RE | Admit: 2020-12-22 | Discharge: 2020-12-22 | Disposition: A | Discharge status: Home / Self Care | Payer: Medicaid Other | Source: Ambulatory Visit

## 2020-12-22 DIAGNOSIS — Z01818 Encounter for other preprocedural examination: Secondary | ICD-10-CM | POA: Diagnosis not present

## 2020-12-22 DIAGNOSIS — Z20822 Contact with and (suspected) exposure to covid-19: Secondary | ICD-10-CM | POA: Diagnosis not present

## 2020-12-22 DIAGNOSIS — Z136 Encounter for screening for cardiovascular disorders: Secondary | ICD-10-CM | POA: Diagnosis not present

## 2020-12-22 HISTORY — DX: Chronic obstructive pulmonary disease, unspecified: J44.9

## 2020-12-22 HISTORY — DX: Gastro-esophageal reflux disease without esophagitis: K21.9

## 2020-12-22 LAB — RAPID HIV SCREEN (HIV 1/2 AB+AG)
HIV 1/2 Antibodies: NONREACTIVE
HIV-1 P24 Antigen - HIV24: NONREACTIVE

## 2020-12-22 LAB — COMPREHENSIVE METABOLIC PANEL
ALT: 15 U/L (ref 0–44)
AST: 15 U/L (ref 15–41)
Albumin: 3.8 g/dL (ref 3.5–5.0)
Alkaline Phosphatase: 59 U/L (ref 38–126)
Anion gap: 8 (ref 5–15)
BUN: 17 mg/dL (ref 6–20)
CO2: 25 mmol/L (ref 22–32)
Calcium: 8.8 mg/dL — ABNORMAL LOW (ref 8.9–10.3)
Chloride: 109 mmol/L (ref 98–111)
Creatinine, Ser: 0.89 mg/dL (ref 0.44–1.00)
GFR, Estimated: >60 mL/min (ref >60)
Glucose, Bld: 83 mg/dL (ref 70–99)
Potassium: 4.2 mmol/L (ref 3.5–5.1)
Sodium: 142 mmol/L (ref 135–145)
Total Bilirubin: 0.5 mg/dL (ref 0.3–1.2)
Total Protein: 6.3 g/dL — ABNORMAL LOW (ref 6.5–8.1)

## 2020-12-22 LAB — TYPE AND SCREEN
ABO/RH(D): A POS
Antibody Screen: NEGATIVE

## 2020-12-22 LAB — CBC
HCT: 43.9 % (ref 36.0–46.0)
Hemoglobin: 14.5 g/dL (ref 12.0–15.0)
MCH: 32.1 pg (ref 26.0–34.0)
MCHC: 33 g/dL (ref 30.0–36.0)
MCV: 97.1 fL (ref 80.0–100.0)
Platelets: 158 10*3/uL (ref 150–400)
RBC: 4.52 MIL/uL (ref 3.87–5.11)
RDW: 14.6 % (ref 11.5–15.5)
WBC: 13.3 10*3/uL — ABNORMAL HIGH (ref 4.0–10.5)
nRBC: 0 % (ref 0.0–0.2)

## 2020-12-22 LAB — SARS CORONAVIRUS 2 (TAT 6-24 HRS): SARS Coronavirus 2: NEGATIVE

## 2020-12-25 ENCOUNTER — Encounter: Admission: RE | Payer: Self-pay | Source: Home / Self Care

## 2020-12-25 ENCOUNTER — Ambulatory Visit
Admission: RE | Admit: 2020-12-25 | Payer: Medicaid Other | Source: Home / Self Care | Admitting: Obstetrics and Gynecology

## 2020-12-25 SURGERY — HYSTERECTOMY, VAGINAL
Anesthesia: General

## 2020-12-25 MED ORDER — POVIDONE-IODINE 10 % EX SWAB: 2.0000 "application " | Freq: Once | CUTANEOUS | Status: DC

## 2020-12-25 MED ORDER — CEFAZOLIN SODIUM-DEXTROSE 2-4 GM/100ML-% IV SOLN: 2.0000 g | INTRAVENOUS | Status: DC

## 2020-12-25 MED ORDER — CHLORHEXIDINE GLUCONATE 0.12 % MT SOLN: 15.0000 mL | Freq: Once | OROMUCOSAL | Status: DC

## 2020-12-25 MED ORDER — LACTATED RINGERS IV SOLN: INTRAVENOUS | Status: DC

## 2020-12-25 MED ORDER — CELECOXIB 200 MG PO CAPS: 400.0000 mg | ORAL_CAPSULE | ORAL | Status: DC

## 2020-12-25 MED ORDER — GABAPENTIN 300 MG PO CAPS: 300.0000 mg | ORAL_CAPSULE | ORAL | Status: DC

## 2020-12-25 MED ORDER — ACETAMINOPHEN 500 MG PO TABS: 1000.0000 mg | ORAL_TABLET | ORAL | Status: DC

## 2020-12-25 MED ORDER — ENOXAPARIN SODIUM 40 MG/0.4ML ~~LOC~~ SOLN: 40.0000 mg | SUBCUTANEOUS | Status: DC

## 2020-12-25 MED ORDER — ORAL CARE MOUTH RINSE: 15.0000 mL | Freq: Once | OROMUCOSAL | Status: DC

## 2020-12-26 ENCOUNTER — Encounter: Payer: Self-pay | Admitting: Family Medicine

## 2020-12-27 ENCOUNTER — Ambulatory Visit: Payer: Medicaid Other | Admitting: Physician Assistant

## 2020-12-27 ENCOUNTER — Encounter: Payer: Self-pay | Admitting: Physician Assistant

## 2020-12-27 ENCOUNTER — Other Ambulatory Visit: Payer: Self-pay

## 2020-12-27 VITALS — BP 124/72 | HR 99 | Temp 98.3°F | Resp 18 | Ht 67.0 in | Wt 172.7 lb

## 2020-12-27 DIAGNOSIS — R2243 Localized swelling, mass and lump, lower limb, bilateral: Secondary | ICD-10-CM

## 2020-12-27 NOTE — Progress Notes (Signed)
Established patient visit   Patient: Monique [name]   DOB: [birth year]   50 y.o. Female  MRN: [id number] Visit Date: 12/27/2020  Today's healthcare provider: Trinna Post, PA-C   Chief Complaint  Patient presents with  . Foot Swelling    bilateral   Subjective    HPI HPI    Foot Swelling    Comments: bilateral       Last edited by Hollie Salk, RMA on 12/27/2020  2:25 PM. (History)      Patient presents today with bilateral leg swelling ongoing for one week. Mostly is sitting or standing most of the day. Seems to get better over night. She had some pain on the right shin. She does smoke and has a 31 pack year smoking history. She reports some burning sensation on the back of her left leg. She feels like there is a rash there but there is not. She also reports some left leg groin pain. 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 She is currently undergoing treatment for imbrevica. No Chest pain Some SOB with COPD.      Medications: Outpatient Medications Prior to Visit  Medication Sig  . ADVAIR DISKUS 250-50 MCG/DOSE AEPB Inhale 1 puff into the lungs 2 (two) times daily.  Marland Kitchen albuterol (VENTOLIN HFA) 108 (90 Base) MCG/ACT inhaler Inhale 2 puffs into the lungs every 4 (four) hours as needed for wheezing or shortness of breath.  . Ascorbic Acid (VITAMIN C) 1000 MG tablet Take 1,000 mg by mouth daily as needed (immune health).  Marland Kitchen atorvastatin (LIPITOR) 10 MG tablet Take 1 tablet (10 mg total) by mouth daily. (Patient taking differently: Take 10 mg by mouth in the morning.)  . Calcipotriene-Betameth Diprop (ENSTILAR) 0.005-0.064 % FOAM Apply to aa's of the hands and feet BID x 2 weeks. Then decrease use to QD 5d/wk PRN.  . Calcium Carbonate-Vit D-Min (GNP CALCIUM 1200) 1200-1000 MG-UNIT CHEW Chew 1,200 mg by mouth daily with breakfast. Take in combination with vitamin D and magnesium. (Patient taking differently:  Chew 2 tablets by mouth in the morning and at bedtime. Take in combination with vitamin D and magnesium.)  . Crisaborole (EUCRISA) 2 % OINT Apply 1 application topically 2 (two) times daily. (Patient taking differently: Apply 1 application topically 2 (two) times daily. Hands and feet)  . Cyanocobalamin (B-12) 500 MCG SUBL Place 1 tablet under the tongue daily.  . DULoxetine (CYMBALTA) 20 MG capsule Take 20 mg by mouth in the morning, at noon, and at bedtime.  . Estradiol (DIVIGEL) 1.25 QP/5.91MB GEL Place 1 application onto the skin in the morning. Applied to inner thigh  . ferrous sulfate 324 (65 Fe) MG TBEC Take 1 tablet (325 mg total) by mouth daily. (Patient taking differently: Take 325 mg by mouth 4 (four) times a week.)  . ibrutinib 420 MG TABS TAKE 1 TABLET BY MOUTH DAILY. (Patient taking differently: Take 420 mg by mouth in the morning.)  . lidocaine-prilocaine (EMLA) cream Apply 1 application topically as needed. (Patient taking differently: Apply 1 application topically as needed (skin irritation).)  . loratadine (CLARITIN) 10 MG tablet Take 1 tablet (10 mg total) by mouth every morning.  . Magnesium 500 MG CAPS Take 500 mg by mouth in the morning.  Marland Kitchen omeprazole (PRILOSEC) 20 MG capsule Take 1 capsule (20 mg total) by mouth daily. (Patient taking differently: Take 20 mg by mouth in  the morning.)  . Oxymetazoline HCl (RHOFADE) 1 % CREA Apply a thin coat to the entire face every morning. (Patient taking differently: Apply 1 application topically in the morning. Apply a thin coat to the entire face every morning.)  . pregabalin (LYRICA) 100 MG capsule Take 100 mg by mouth 3 (three) times daily.  . sertraline (ZOLOFT) 100 MG tablet Take 150 mg by mouth in the morning.  . DULoxetine (CYMBALTA) 60 MG capsule Take 1 capsule (60 mg total) by mouth daily. (Patient not taking: Reported on 12/27/2020)  . meloxicam (MOBIC) 15 MG tablet Take 1 tablet (15 mg total) by mouth daily. (Patient taking  differently: Take 15 mg by mouth in the morning.)  . tiZANidine (ZANAFLEX) 2 MG tablet Take 1 tablet (2 mg total) by mouth every 8 (eight) hours as needed for muscle spasms. (Patient taking differently: Take 2 mg by mouth 3 (three) times daily.)   No facility-administered medications prior to visit.    Review of Systems     Objective    BP 124/72   Pulse 99   Temp 98.3 F (36.8 C) (Oral)   Resp 18   Ht 5\' 7"  (1.702 m)   Wt 172 lb 11.2 oz (78.3 kg)   LMP  (LMP Unknown)   SpO2 96%   BMI 27.05 kg/m     Physical Exam Constitutional:      Appearance: Normal appearance.  Cardiovascular:     Rate and Rhythm: Normal rate and regular rhythm.     Heart sounds: Normal heart sounds.  Pulmonary:     Effort: Pulmonary effort is normal.     Breath sounds: Normal breath sounds.  Musculoskeletal:     Right lower leg: Edema present.     Left lower leg: Edema present.     Comments: Mild nonpitting bilateral lower extremity edema. No calf pain with palpation of deep venous system.   Skin:    General: Skin is warm and dry.  Neurological:     Mental Status: She is alert and oriented to person, place, and time. Mental status is at baseline.  Psychiatric:        Mood and Affect: Mood normal.        Behavior: Behavior normal.       No results found for any visits on 12/27/20.  Assessment & Plan    1. Localized swelling of both lower legs  Suspect side effects from imbrevica vs. Venous dysfunction vs. Dependent edema. Low concern for blood clot or heart failure, especially given normal appearing heart on 07/2020 ct chest. Advised on elevation and compression socks. Return if not improving or worsening.    Return if symptoms worsen or fail to improve.        Trinna Post, PA-C  [facility] [phone] (phone) [phone] (fax)  [place]

## 2020-12-27 NOTE — Patient Instructions (Signed)
[email or web address] [email or web address]

## 2020-12-28 ENCOUNTER — Other Ambulatory Visit: Payer: Medicaid Other

## 2020-12-28 ENCOUNTER — Other Ambulatory Visit: Payer: Self-pay

## 2020-12-28 NOTE — Patient Instructions (Signed)
Visit Information  [name]  - as a part of your Medicaid benefit, you are eligible for care management and care coordination services at no cost or copay. I was unable to reach you by phone today but would be happy to help you with your health related needs. Please feel free to call me @ [phone].   A member of the Managed Medicaid care management team will reach out to you again over the next 14 days.   Mickel Fuchs, BSW, [place]  High Risk Managed Medicaid Team

## 2020-12-28 NOTE — Patient Outreach (Signed)
Pain Dr. Christel Mormon Meloxicam refills because patient not seen in 7 months  Requested from PCP per Patient's wishes.  Also requested medication for Hot Flashes to be sent to [facility].

## 2020-12-28 NOTE — Patient Outreach (Signed)
Care Coordination  12/28/2020  Monique [name] May 14, 1971 [id number]   Medicaid Managed Care   Unsuccessful Outreach Note  12/28/2020 Name: Monique [name] MRN: [id number] DOB: August 28, 1971  Referred by: [name], MD Reason for referral : High Risk Managed Medicaid (SW MM unsuccessful Telephone Outreach)   An unsuccessful telephone outreach was attempted today. The patient was referred to the case management team for assistance with care management and care coordination.   Follow Up Plan: The care management team will reach out to the patient again over the next 14 days.   Mickel Fuchs, BSW, [place]  High Risk Managed Medicaid Team

## 2021-01-01 ENCOUNTER — Encounter: Payer: Self-pay | Admitting: Oncology

## 2021-01-01 ENCOUNTER — Other Ambulatory Visit: Payer: Self-pay

## 2021-01-01 ENCOUNTER — Inpatient Hospital Stay: Payer: Medicaid Other | Attending: Oncology

## 2021-01-01 ENCOUNTER — Telehealth: Payer: Self-pay | Admitting: *Deleted

## 2021-01-01 ENCOUNTER — Inpatient Hospital Stay: Payer: Medicaid Other | Admitting: Pharmacist

## 2021-01-01 ENCOUNTER — Inpatient Hospital Stay (HOSPITAL_BASED_OUTPATIENT_CLINIC_OR_DEPARTMENT_OTHER): Payer: Medicaid Other | Admitting: Oncology

## 2021-01-01 VITALS — BP 98/59 | HR 74 | Temp 97.3°F | Resp 17 | Ht 67.0 in | Wt 173.6 lb

## 2021-01-01 DIAGNOSIS — C9110 Chronic lymphocytic leukemia of B-cell type not having achieved remission: Secondary | ICD-10-CM

## 2021-01-01 DIAGNOSIS — F419 Anxiety disorder, unspecified: Secondary | ICD-10-CM | POA: Diagnosis not present

## 2021-01-01 DIAGNOSIS — F17210 Nicotine dependence, cigarettes, uncomplicated: Secondary | ICD-10-CM | POA: Diagnosis not present

## 2021-01-01 DIAGNOSIS — D509 Iron deficiency anemia, unspecified: Secondary | ICD-10-CM | POA: Diagnosis not present

## 2021-01-01 DIAGNOSIS — F32A Depression, unspecified: Secondary | ICD-10-CM | POA: Diagnosis not present

## 2021-01-01 LAB — COMPREHENSIVE METABOLIC PANEL
ALT: 13 U/L (ref 0–44)
AST: 18 U/L (ref 15–41)
Albumin: 3.9 g/dL (ref 3.5–5.0)
Alkaline Phosphatase: 59 U/L (ref 38–126)
Anion gap: 10 (ref 5–15)
BUN: 16 mg/dL (ref 6–20)
CO2: 23 mmol/L (ref 22–32)
Calcium: 8.4 mg/dL — ABNORMAL LOW (ref 8.9–10.3)
Chloride: 104 mmol/L (ref 98–111)
Creatinine, Ser: 0.88 mg/dL (ref 0.44–1.00)
GFR, Estimated: >60 mL/min (ref >60)
Glucose, Bld: 124 mg/dL — ABNORMAL HIGH (ref 70–99)
Potassium: 3.8 mmol/L (ref 3.5–5.1)
Sodium: 137 mmol/L (ref 135–145)
Total Bilirubin: 0.7 mg/dL (ref 0.3–1.2)
Total Protein: 6.3 g/dL — ABNORMAL LOW (ref 6.5–8.1)

## 2021-01-01 LAB — IRON AND TIBC
Iron: 67 ug/dL (ref 28–170)
Saturation Ratios: 18 % (ref 10.4–31.8)
TIBC: 370 ug/dL (ref 250–450)
UIBC: 303 ug/dL

## 2021-01-01 LAB — CBC WITH DIFFERENTIAL/PLATELET
Abs Immature Granulocytes: 0.07 10*3/uL (ref 0.00–0.07)
Basophils Absolute: 0.1 10*3/uL (ref 0.0–0.1)
Basophils Relative: 0 %
Eosinophils Absolute: 0.1 10*3/uL (ref 0.0–0.5)
Eosinophils Relative: 1 %
HCT: 44.6 % (ref 36.0–46.0)
Hemoglobin: 15.2 g/dL — ABNORMAL HIGH (ref 12.0–15.0)
Immature Granulocytes: 1 %
Lymphocytes Relative: 45 %
Lymphs Abs: 5.4 10*3/uL — ABNORMAL HIGH (ref 0.7–4.0)
MCH: 32.6 pg (ref 26.0–34.0)
MCHC: 34.1 g/dL (ref 30.0–36.0)
MCV: 95.7 fL (ref 80.0–100.0)
Monocytes Absolute: 0.8 10*3/uL (ref 0.1–1.0)
Monocytes Relative: 7 %
Neutro Abs: 5.6 10*3/uL (ref 1.7–7.7)
Neutrophils Relative %: 46 %
Platelets: 150 10*3/uL (ref 150–400)
RBC: 4.66 MIL/uL (ref 3.87–5.11)
RDW: 13.7 % (ref 11.5–15.5)
WBC: 12.1 10*3/uL — ABNORMAL HIGH (ref 4.0–10.5)
nRBC: 0 % (ref 0.0–0.2)

## 2021-01-01 LAB — FERRITIN: Ferritin: 10 ng/mL — ABNORMAL LOW (ref 11–307)

## 2021-01-01 MED ORDER — IBRUTINIB 420 MG PO TABS
420.0000 mg | ORAL_TABLET | Freq: Every day | ORAL | 5 refills | Status: DC
  Filled 2021-01-01: qty 28, fill #0
  Filled 2021-01-12: qty 28, 28d supply, fill #0
  Filled 2021-03-07: qty 28, 28d supply, fill #1
  Filled 2021-04-02: qty 28, 28d supply, fill #2
  Filled 2021-05-02: qty 28, 28d supply, fill #3
  Filled 2021-06-25: qty 28, 28d supply, fill #4
  Filled 2021-07-20: qty 28, 28d supply, fill #5

## 2021-01-01 NOTE — Progress Notes (Signed)
[place]  [phone] Fax:(336) [phone]  ID: Monique [name] OB: 07-Oct-1970  MR#: [id number]  EYC#:[id number]  Patient Care Team: Monique Sizer, MD as PCP - General (Family Medicine) Monique Huger, MD as Consulting Physician (Oncology) [name], Monique Monaco, RN as Case Manager Monique [name], [facility] as Pharmacist (Pharmacist)   CHIEF COMPLAINT: CLL, iron deficiency.  INTERVAL HISTORY: Patient returns to clinic today for routine 61-monthevaluation.  She was last seen in clinic on 09/28/2020.   She is followed fairly closely by her PCP Dr. SAncil Boozerand has been seen for various complaints in the interim.  She was treated for MRSA positive scalp lesion which was excised.  She was placed on Bactrim DS and Bactroban topical cream.  She was referred to psychiatry for anxiety.  She was seen by Dr. 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  Dr. TCoralyn Markrecommended hysterectomy instead of yearly Pap smears. She is scheduled for next month.   She was seen by Dr. 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  She continues to tolerate Imbruvica well without any side effects.  She has chronic issues with her wrist hands and ankles.  She is still on disability.  Overall feels well.  States the IKate [name]"makes her tired" but otherwise denies any side effects. Is not taking iron tablets because they cause constipation and stopped b12 supplements d/t her levels being high.   REVIEW OF SYSTEMS:   Review of Systems  Constitutional: Negative for diaphoresis, fever, malaise/fatigue and weight loss.  HENT: Negative.  Negative for congestion.   Respiratory: Negative.  Negative for cough and shortness of breath.   Cardiovascular: Negative.  Negative for chest pain and leg swelling.  Gastrointestinal: Negative for abdominal pain and constipation.  Genitourinary:  Negative.  Negative for dysuria and frequency.  Musculoskeletal: Positive for joint pain. Negative for back pain, myalgias and neck pain.  Skin: Negative.   Neurological: Positive for sensory change. Negative for tingling, focal weakness and weakness.  Psychiatric/Behavioral: Negative.  Negative for depression and suicidal ideas. The patient is not nervous/anxious.     As per HPI. Otherwise, a complete review of systems is negative.  PAST MEDICAL HISTORY: Past Medical History:  Diagnosis Date  . Anxiety   . Bursitis    leg pain  . Carpal tunnel syndrome   . Cervical dysplasia    hx LEEP over 18 years ago.   . [place]   [name] . [place]   . COVID-19 2021  . Depression   . Eating disorder   . GERD (gastroesophageal reflux disease)   . History of self-harm   . Insomnia   . Obsession   . Tobacco abuse   . Vitamin B12 deficiency (non anemic)     PAST SURGICAL HISTORY: Past Surgical History:  Procedure Laterality Date  . BREAST BIOPSY Right 01/05/2018   [place] of 2 areas and 1 lymph node, MIXED INFLAMMATION AND GIANT CELL REACTION  . CERVICAL BIOPSY  W/ LOOP ELECTRODE EXCISION    . COLONOSCOPY WITH PROPOFOL N/A 04/21/2019   Procedure: COLONOSCOPY WITH PROPOFOL;  Surgeon: Monique Manifold MD;  Location: [facility] ENDOSCOPY;  Service: Gastroenterology;  Laterality: N/A;  . OTHER SURGICAL HISTORY     scar tissue removed from vocal cords  . TUBAL LIGATION    .  vocal cord surgery  2005    FAMILY HISTORY: Family History  Problem Relation Age of Onset  . Cancer Mother        thyroid  . Alcohol abuse Father   . Alcohol abuse Brother   . Depression Brother   . Bipolar disorder Brother   . ADD / ADHD Son   . Breast cancer Neg Hx     ADVANCED DIRECTIVES (Y/N):  N  HEALTH MAINTENANCE: Social History   Tobacco Use  . Smoking status: Current Every Day Smoker    Packs/day: 1.00     Years: 31.00    Pack years: 31.00    Types: Cigarettes    Start date: 09/25/1986  . Smokeless tobacco: Never Used  Vaping Use  . Vaping Use: Former  Substance Use Topics  . Alcohol use: Not Currently    Alcohol/week: 0.0 standard drinks    Comment: rarely  . Drug use: Yes    Types: Marijuana     Colonoscopy:  PAP:  Bone density:  Lipid panel:  No Known Allergies  Current Outpatient Medications  Medication Sig Dispense Refill  . ADVAIR DISKUS 250-50 MCG/DOSE AEPB Inhale 1 puff into the lungs 2 (two) times daily. 180 each 1  . albuterol (VENTOLIN HFA) 108 (90 Base) MCG/ACT inhaler Inhale 2 puffs into the lungs every 4 (four) hours as needed for wheezing or shortness of breath. 8.5 g 0  . Ascorbic Acid (VITAMIN C) 1000 MG tablet Take 1,000 mg by mouth daily as needed (immune health).    Marland Kitchen atorvastatin (LIPITOR) 10 MG tablet Take 1 tablet (10 mg total) by mouth daily. (Patient taking differently: Take 10 mg by mouth in the morning.) 90 tablet 1  . Calcipotriene-Betameth Diprop (ENSTILAR) 0.005-0.064 % FOAM Apply to aa's of the hands and feet BID x 2 weeks. Then decrease use to QD 5d/wk PRN. 60 g 3  . Calcium Carbonate-Vit D-Min (GNP CALCIUM 1200) 1200-1000 MG-UNIT CHEW Chew 1,200 mg by mouth daily with breakfast. Take in combination with vitamin D and magnesium. (Patient taking differently: Chew 2 tablets by mouth in the morning and at bedtime. Take in combination with vitamin D and magnesium.) 90 tablet 3  . Crisaborole (EUCRISA) 2 % OINT Apply 1 application topically 2 (two) times daily. (Patient taking differently: Apply 1 application topically 2 (two) times daily. Hands and feet) 60 g 3  . Cyanocobalamin (B-12) 500 MCG SUBL Place 1 tablet under the tongue daily. 90 tablet 1  . DULoxetine (CYMBALTA) 20 MG capsule Take 20 mg by mouth in the morning, at noon, and at bedtime.    . DULoxetine (CYMBALTA) 60 MG capsule Take 1 capsule (60 mg total) by mouth daily. (Patient not taking:  Reported on 12/27/2020) 90 capsule 0  . Estradiol (DIVIGEL) 1.25 RF/1.63WG GEL Place 1 application onto the skin in the morning. Applied to inner thigh 1.25 g 11  . ferrous sulfate 324 (65 Fe) MG TBEC Take 1 tablet (325 mg total) by mouth daily. (Patient taking differently: Take 325 mg by mouth 4 (four) times a week.) 90 tablet 1  . ibrutinib 420 MG TABS TAKE 1 TABLET BY MOUTH DAILY. (Patient taking differently: Take 420 mg by mouth in the morning.) 28 tablet 2  . lidocaine-prilocaine (EMLA) cream Apply 1 application topically as needed. (Patient taking differently: Apply 1 application topically as needed (skin irritation).) 30 g 1  . loratadine (CLARITIN) 10 MG tablet Take 1 tablet (10 mg total) by mouth every morning. 90 tablet  3  . Magnesium 500 MG CAPS Take 500 mg by mouth in the morning.    . meloxicam (MOBIC) 15 MG tablet Take 1 tablet (15 mg total) by mouth daily. (Patient taking differently: Take 15 mg by mouth in the morning.) 30 tablet 2  . omeprazole (PRILOSEC) 20 MG capsule Take 1 capsule (20 mg total) by mouth daily. (Patient taking differently: Take 20 mg by mouth in the morning.) 90 capsule 1  . Oxymetazoline HCl (RHOFADE) 1 % CREA Apply a thin coat to the entire face every morning. (Patient taking differently: Apply 1 application topically in the morning. Apply a thin coat to the entire face every morning.) 30 g 3  . pregabalin (LYRICA) 100 MG capsule Take 100 mg by mouth 3 (three) times daily.    . sertraline (ZOLOFT) 100 MG tablet Take 150 mg by mouth in the morning.    Marland Kitchen tiZANidine (ZANAFLEX) 2 MG tablet Take 1 tablet (2 mg total) by mouth every 8 (eight) hours as needed for muscle spasms. (Patient taking differently: Take 2 mg by mouth 3 (three) times daily.) 90 tablet 2   No current facility-administered medications for this visit.    OBJECTIVE: There were no vitals filed for this visit.   There is no height or weight on file to calculate BMI.    ECOG FS:0 -  Asymptomatic   Physical Exam Constitutional:      Appearance: Normal appearance.  HENT:     Head: Normocephalic and atraumatic.  Eyes:     Pupils: Pupils are equal, round, and reactive to light.  Cardiovascular:     Rate and Rhythm: Normal rate and regular rhythm.     Heart sounds: Normal heart sounds. No murmur heard.   Pulmonary:     Effort: Pulmonary effort is normal.     Breath sounds: Normal breath sounds. No wheezing.  Abdominal:     General: Bowel sounds are normal. There is no distension.     Palpations: Abdomen is soft.     Tenderness: There is no abdominal tenderness.  Musculoskeletal:        General: Normal range of motion.     Cervical back: Normal range of motion.  Skin:    General: Skin is warm and dry.     Findings: No rash.  Neurological:     Mental Status: She is alert and oriented to person, place, and time.  Psychiatric:        Judgment: Judgment normal.      LAB RESULTS:  Lab Results  Component Value Date   NA 142 12/22/2020   K 4.2 12/22/2020   CL 109 12/22/2020   CO2 25 12/22/2020   GLUCOSE 83 12/22/2020   BUN 17 12/22/2020   CREATININE 0.89 12/22/2020   CALCIUM 8.8 (L) 12/22/2020   PROT 6.3 (L) 12/22/2020   ALBUMIN 3.8 12/22/2020   AST 15 12/22/2020   ALT 15 12/22/2020   ALKPHOS 59 12/22/2020   BILITOT 0.5 12/22/2020   GFRNONAA >60 12/22/2020   GFRAA 79 12/06/2020    Lab Results  Component Value Date   WBC 13.3 (H) 12/22/2020   NEUTROABS 4.8 09/28/2020   HGB 14.5 12/22/2020   HCT 43.9 12/22/2020   MCV 97.1 12/22/2020   PLT 158 12/22/2020     STUDIES: No results found.  ASSESSMENT: CLL,  iron deficiency.  PLAN:    1. CLL:  -Confirmed by peripheral flow cytometry.  CLL FISH panel revealed deletion of the T p53 gene  on chromosome 64 which is associated with a more adverse prognosis. -She completed 3 cycles of Rituxan plus Treanda on 03/26/2018 but had progressive disease. -She was started on Imbruvica in November  2019. -Most recent imaging CT chest abdomen pelvis from 06/13/20 did not reveal any progression of disease. -Repeat CT scan in September 2022. -Labs from 01/01/2021 show a WBC of 12.1 and hemoglobin of 15.2. -RTC in 3 months with repeat labs and 6 months with labs, md assessment and CT scan results..  2.  Leukocytosis: -Labs from 01/01/2021 show a white blood cell count of 12.1. -We will continue to monitor.  3.  Abnormal Pap smear: -She is scheduled to have a total hysterectomy on 01/22/2021 with Dr. Marcelline [name]. -She will hold Imbruvica 1 week prior to surgery.  Nuala Alpha, oral chemo pharmacist to discuss with patient.  4.  Depression/anxiety: -She has been referred to psychiatry. -She is currently on Cymbalta, Zoloft-stable  5. IDA: -She is currently not taking oral iron supplements secondary to constipation. -She received 1 dose of IV Feraheme on 10/21/2019. -Labs from 01/01/2021 showed ferritin of 10 with an iron saturation of 18%. -Recommend restarting iron tablets with stool softener. -Can try a different type of iron (ferrous sulfate vs. Ferrous gluconate) daily or every other day dosing to see if she can tolerate versus additional IV iron.  She will let us know if she is unable to tolerate.  Disposition: -RTC in 3 months for labs only (CBC, CMP, iron, ferritin, B12). -RTC in 6 months for lab work (CBC, CMP, iron, ferritin), MD assessment and results of CT CAP.   Greater than 50% was spent in counseling and coordination of care with this patient including but not limited to discussion of the relevant topics above (See A&P) including, but not limited to diagnosis and management of acute and chronic medical conditions.   Patient expressed understanding and was in agreement with this plan. She also understands that She can call clinic at any time with any questions, concerns, or complaints.    Jacquelin Hawking, NP   01/01/2021 1:30 PM

## 2021-01-01 NOTE — Telephone Encounter (Deleted)
   Telephone encounter was:  Successful.  01/01/2021 Name: Monique [name] MRN: [id number] DOB: [birth year]     Telephone encounter was:  Successful.  01/01/2021 Name: Monique [name] MRN: [id number] DOB: [birth year]  Monique [name] is a 50 y.o. year old female who is a primary care patient of Monique Sizer, MD . The community resource team was consulted for assistance with {CRR CG Referral Reasons:25095}  Care guide performed the following interventions: {CG INTERVENTIONS:25096}.  Follow Up Plan:  {CG FOLLOW UP YWVX:42767}  SIG***   Monique [name] is a 49 y.o. year old female who is a primary care patient of Monique Sizer, MD . The community resource team was consulted for assistance with massager for chronic pain   Care guide performed the following interventions: Patient provided with information about care guide support team and interviewed to confirm resource needs Follow up call placed to community resources to determine status of patients referral Follow up call placed to the patient to discuss status of referral.  Follow Up Plan:  No further follow up planned at this time. The patient has been provided with needed resources.Will email when receives [place], Care Management  [phone] [address] , [place] [address] Email : [name]

## 2021-01-01 NOTE — Progress Notes (Signed)
[place]  Telephone:([phone] Fax:(336) [phone]  Patient Care Team: [name], MD as PCP - General (Family Medicine) Lloyd Huger, MD as Consulting Physician (Oncology) Craft, Lorel Monaco, RN as Case Manager Lane Hacker, [facility] as Pharmacist (Pharmacist)   Name of the patient: Monique [name]  [id number]  Nov 22, 1970   Date of visit: 01/01/21  HPI: Patient is a 50 y.o. female with CLL. Currently treated with ibrutinib. She has been on treatment since 07/2018.  Reason for Consult: Oral chemotherapy follow-up for ibrutinib therapy.   PAST MEDICAL HISTORY: Past Medical History:  Diagnosis Date  . Anxiety   . Bursitis    leg pain  . Carpal tunnel syndrome   . Cervical dysplasia    hx LEEP over 18 years ago.   . Chronic lymphocytic leukemia ([place]) 2018   Dr Grayland Ormond  . COPD (chronic obstructive pulmonary disease) ([place])   . COVID-19 2021  . Depression   . Eating disorder   . GERD (gastroesophageal reflux disease)   . History of self-harm   . Insomnia   . Obsession   . Tobacco abuse   . Vitamin B12 deficiency (non anemic)     HEMATOLOGY/ONCOLOGY HISTORY:  Oncology History Overview Note  Patient with recent CLL diagnosis by flow cytometry.  Previously not being treated in observation.  S/p 4 cycles of weekly Rituxan completing on 02/20/2017 scans from January 2018 revealed minimal progression.  Developed right breast mass in March 2019.  Had diagnostic mammogram performed on 12/26/2017 showed suspicious right breast mass.  Biopsy revealed granulomatous mastitis.  She was treated with doxycycline 100 mg twice daily for 7 days.  Developed worsening adenopathy.  CT chest/abdomen/pelvis and soft tissue neck from May 2019 revealed progressive bulky adenopathy in the chest, abdomen and pelvis compatible with worsening disease.  [name] recommended beginning treatment with Rituxan and Treanda.  Received first  cycle last week.  S/p 6 cycles of Rituxan and Treanda.  Tolerating well.  Last dose given 03/25/18.   CLL (chronic lymphocytic leukemia) ([place])  11/24/2016 Initial Diagnosis   CLL (chronic lymphocytic leukemia) ([place])   01/21/2018 - 03/26/2018 Chemotherapy   The patient had palonosetron (ALOXI) injection 0.25 mg, 0.25 mg, Intravenous,  Once, 4 of 5 cycles Administration: 0.25 mg (01/28/2018), 0.25 mg (02/25/2018), 0.25 mg (03/25/2018) riTUXimab (RITUXAN) 700 mg in sodium chloride 0.9 % 250 mL (2.1875 mg/mL) infusion, 375 mg/m2 = 700 mg, Intravenous,  Once, 4 of 5 cycles Administration: 700 mg (01/28/2018) bendamustine (BENDEKA) 150 mg in sodium chloride 0.9 % 50 mL (2.6786 mg/mL) chemo infusion, 90 mg/m2 = 150 mg, Intravenous,  Once, 4 of 5 cycles Administration: 150 mg (01/28/2018), 150 mg (01/29/2018), 150 mg (02/25/2018), 150 mg (02/26/2018), 150 mg (03/25/2018), 150 mg (03/26/2018)  for chemotherapy treatment.      ALLERGIES:  has No Known Allergies.  MEDICATIONS:  Current Outpatient Medications  Medication Sig Dispense Refill  . ADVAIR DISKUS 250-50 MCG/DOSE AEPB Inhale 1 puff into the lungs 2 (two) times daily. 180 each 1  . albuterol (VENTOLIN HFA) 108 (90 Base) MCG/ACT inhaler Inhale 2 puffs into the lungs every 4 (four) hours as needed for wheezing or shortness of breath. 8.5 g 0  . Ascorbic Acid (VITAMIN C) 1000 MG tablet Take 1,000 mg by mouth daily as needed (immune health).    Marland Kitchen atorvastatin (LIPITOR) 10 MG tablet Take 1 tablet (10 mg total) by mouth daily. (Patient taking differently: Take 10 mg by mouth  in the morning.) 90 tablet 1  . Calcipotriene-Betameth Diprop (ENSTILAR) 0.005-0.064 % FOAM Apply to aa's of the hands and feet BID x 2 weeks. Then decrease use to QD 5d/wk PRN. (Patient not taking: Reported on 01/01/2021) 60 g 3  . Calcium Carbonate-Vit D-Min (GNP CALCIUM 1200) 1200-1000 MG-UNIT CHEW Chew 1,200 mg by mouth daily with breakfast. Take in combination with vitamin D and  magnesium. (Patient taking differently: Chew 2 tablets by mouth in the morning and at bedtime. Take in combination with vitamin D and magnesium.) 90 tablet 3  . Crisaborole (EUCRISA) 2 % OINT Apply 1 application topically 2 (two) times daily. (Patient taking differently: Apply 1 application topically 2 (two) times daily. Hands and feet) 60 g 3  . Cyanocobalamin (B-12) 500 MCG SUBL Place 1 tablet under the tongue daily. 90 tablet 1  . DULoxetine (CYMBALTA) 20 MG capsule Take 20 mg by mouth in the morning, at noon, and at bedtime.    . DULoxetine (CYMBALTA) 60 MG capsule Take 1 capsule (60 mg total) by mouth daily. (Patient not taking: Reported on 01/01/2021) 90 capsule 0  . Estradiol (DIVIGEL) 1.25 DT/2.67TI GEL Place 1 application onto the skin in the morning. Applied to inner thigh 1.25 g 11  . ferrous sulfate 324 (65 Fe) MG TBEC Take 1 tablet (325 mg total) by mouth daily. (Patient taking differently: Take 325 mg by mouth 4 (four) times a week.) 90 tablet 1  . ibrutinib 420 MG TABS TAKE 1 TABLET BY MOUTH DAILY. (Patient taking differently: Take 420 mg by mouth in the morning.) 28 tablet 2  . lidocaine-prilocaine (EMLA) cream Apply 1 application topically as needed. (Patient taking differently: Apply 1 application topically as needed (skin irritation).) 30 g 1  . loratadine (CLARITIN) 10 MG tablet Take 1 tablet (10 mg total) by mouth every morning. 90 tablet 3  . Magnesium 500 MG CAPS Take 500 mg by mouth in the morning.    . meloxicam (MOBIC) 15 MG tablet Take 1 tablet (15 mg total) by mouth daily. (Patient taking differently: Take 15 mg by mouth in the morning.) 30 tablet 2  . omeprazole (PRILOSEC) 20 MG capsule Take 1 capsule (20 mg total) by mouth daily. (Patient taking differently: Take 20 mg by mouth in the morning.) 90 capsule 1  . Oxymetazoline HCl (RHOFADE) 1 % CREA Apply a thin coat to the entire face every morning. (Patient taking differently: Apply 1 application topically in the morning.  Apply a thin coat to the entire face every morning.) 30 g 3  . pregabalin (LYRICA) 100 MG capsule Take 100 mg by mouth 3 (three) times daily.    . sertraline (ZOLOFT) 100 MG tablet Take 150 mg by mouth in the morning.    Marland Kitchen tiZANidine (ZANAFLEX) 2 MG tablet Take 1 tablet (2 mg total) by mouth every 8 (eight) hours as needed for muscle spasms. (Patient taking differently: Take 2 mg by mouth 3 (three) times daily.) 90 tablet 2   No current facility-administered medications for this visit.    VITAL SIGNS: LMP  (LMP Unknown)  There were no vitals filed for this visit.  Estimated body mass index is 27.19 kg/m as calculated from the following:   Height as of an earlier encounter on 01/01/21: 5\' 7"  (1.702 m).   Weight as of an earlier encounter on 01/01/21: 78.7 kg (173 lb 9.6 oz).  LABS: CBC:    Component Value Date/Time   WBC 12.1 (H) 01/01/2021 1440   HGB  15.2 (H) 01/01/2021 1440   HCT 44.6 01/01/2021 1440   PLT 150 01/01/2021 1440   MCV 95.7 01/01/2021 1440   NEUTROABS 5.6 01/01/2021 1440   LYMPHSABS 5.4 (H) 01/01/2021 1440   MONOABS 0.8 01/01/2021 1440   EOSABS 0.1 01/01/2021 1440   BASOSABS 0.1 01/01/2021 1440   Comprehensive Metabolic Panel:    Component Value Date/Time   NA 137 01/01/2021 1440   NA 138 05/13/2018 1413   K 3.8 01/01/2021 1440   CL 104 01/01/2021 1440   CO2 23 01/01/2021 1440   BUN 16 01/01/2021 1440   BUN 15 05/13/2018 1413   CREATININE 0.88 01/01/2021 1440   CREATININE 0.96 12/06/2020 1646   GLUCOSE 124 (H) 01/01/2021 1440   CALCIUM 8.4 (L) 01/01/2021 1440   AST 18 01/01/2021 1440   ALT 13 01/01/2021 1440   ALKPHOS 59 01/01/2021 1440   BILITOT 0.7 01/01/2021 1440   BILITOT 0.4 05/13/2018 1413   PROT 6.3 (L) 01/01/2021 1440   PROT 6.3 05/13/2018 1413   ALBUMIN 3.9 01/01/2021 1440   ALBUMIN 4.1 05/13/2018 1413     Assessment and Plan-  CBC and CMP reviewed, ALC stable and close to WNL. Continue ibrutinib 420mg  daily.  She reports fatigue and  sweats. She attributes her fatigue to her depression and sweats, which occur during the day and at night, to her menopause. She has a hysterectomy planned for 01/22/21. She has not noticed any enlarged lymph nodes.  Instructed Ms. Klimaszewski to hold her ibruitnib at least one week prior to her surgery and for at least one week after surgery depending on how her surgeon feels she is healing.    Oral Chemotherapy Side Effect/Intolerance:  No reported constipation, diarrhea, or nausea Edema: she has some edema in her feet and ankles. I suggested she elevate her feet when sitting or laying down and wearing support stocks. I suggest she start by wearing her support stocking when she goes walking. She walks about once a day with her dogs.  Other: Dry mouth: she recently picked up lozenges to up with this  Cough and nasal drainage: a couple weeks ago, she used Mucinex DM to help with this. She takes daily allergy medication  Oral Chemotherapy Adherence: no report missed doses No patient barriers to medication adherence identified.   New medications: none reported  Medication Access Issues: none refill sent to [place]  Patient expressed understanding and was in agreement with this plan. She also understands that She can call clinic at any time with any questions, concerns, or complaints.   Thank you for allowing me to participate in the care of this very pleasant patient.   Time Total: 15 mins  Visit consisted of counseling and education on dealing with issues of symptom management in the setting of serious and potentially life-threatening illness.Greater than 50%  of this time was spent counseling and coordinating care related to the above assessment and plan.  Signed by: [name], PharmD, BCPS, Salley Slaughter, CPP Hematology/Oncology Clinical Pharmacist Practitioner [facility]/AP [facility] [phone]  01/01/2021 3:53 PM

## 2021-01-01 NOTE — Telephone Encounter (Signed)
   Telephone encounter was:  Successful.  01/01/2021 Name: Monique [name] MRN: [id number] DOB: 1971/07/25  [name] is a 50 y.o. year old female who is a primary care patient of [name], MD . The community resource team was consulted for assistance with Financial Difficulties related to dme  and [place] guide performed the following interventions: Patient provided with information about care guide support team and interviewed to confirm resource needs Follow up call placed to community resources to determine status of patients referral Follow up call placed to the patient to discuss status of referral.  Follow Up Plan:  No further follow up planned at this time. The patient has been provided with needed resources.  [place], Care Management  [phone] [address] , [place] [address] Email : [email or web address]

## 2021-01-02 ENCOUNTER — Other Ambulatory Visit (HOSPITAL_COMMUNITY): Payer: Self-pay

## 2021-01-02 ENCOUNTER — Encounter: Payer: Self-pay | Admitting: Oncology

## 2021-01-04 ENCOUNTER — Telehealth: Payer: Self-pay | Admitting: *Deleted

## 2021-01-04 ENCOUNTER — Telehealth: Payer: Self-pay | Admitting: Licensed Clinical Social Worker

## 2021-01-04 ENCOUNTER — Other Ambulatory Visit: Payer: Self-pay | Admitting: Licensed Clinical Social Worker

## 2021-01-04 NOTE — Telephone Encounter (Signed)
LCSW received call from [name], Education officer, museum from [employer].

## 2021-01-04 NOTE — Patient Instructions (Addendum)
Visit Information  Ms. Kuehne was given information about Medicaid Managed Care team care coordination services as a part of their [facility] Medicaid benefit. Terrianna Holsclaw verbally consented to engagement with the [facility] Managed Care team.   For questions related to your [facility] health plan, please call: [phone]  If you would like to schedule transportation through your [facility] plan, please call the following number at least 2 days in advance of your appointment: [phone]   Call the [place] at [phone], at any time, 24 hours a day, 7 days a week. If you are in danger or need immediate medical attention call 911.  Ms. Gewirtz - following are the goals we discussed in your visit today:  Goals Addressed            This Visit's Progress   . Manage My Emotions       Eman Rynders is a 50 y.o. year old female who sees [name], MD for primary care. The  [facility] Managed Care team was consulted for assistance with Mental Health Concerns . Ms. Nebel was given information about Care Management services, agreed to services, and verbal consent for services was obtained.  Timeframe:  Long-Range Goal Priority:  Medium Start Date:   01/04/21                      Expected End Date:  03/06/21                    Follow Up Date - 01/09/21   - begin personal counseling - call and visit an old friend - check out volunteer opportunities - join a support group - laugh; watch a funny movie or comedian - learn and use visualization or guided imagery - perform a random act of kindness - practice relaxation or meditation daily - start or continue a personal journal - talk about feelings with a friend, family or spiritual advisor - practice positive thinking and self-talk    Why is this important?    When you are stressed, down or upset, your body reacts too.   For example, your blood pressure may get higher; you may have a headache or  stomachache.   When your emotions get the best of you, your body's ability to fight off cold and flu gets weak.   These steps will help you manage your emotions.     Current Barriers:  . Chronic Mental Health needs related to Anxiety and Depression . Limited social support, ADL IADL limitations, Mental Health Concerns , and Social Isolation . Suicidal Ideation/Homicidal Ideation: No  Clinical Social Work Goal(s):  Marland Kitchen Over the next 120 days, patient will work with SW monthly by telephone or in person to reduce or manage symptoms related to anxiety and depression . patient will work with CCM LCSW to address needs related to finding a long term therapist  . patient will experience decrease in ED visits as evidenced by patient report and review of EMR. ED visits in last 6 months. . patient will attend all scheduled medical appointments as evidenced by patient report and care team review of appointment completion in EMR.   Interventions: . Patient interviewed and appropriate assessments performed: brief mental health assessment and PHQ 2-9 completed . Provided mental health counseling with regard to mental health management. Patient is experiencing ongoing depression and anxiety and is having a difficult time finding a mental health provider that will accept her for  services. Patient was informed that [employer] declined her referral on 12/11/20. Patient has no current psychiatrist or counselor but reports that her PCP is agreeable to keep prescribing her psychotropic medications. Patient reports that she worked previously with a Milton Ferguson at the [facility] Department and wishes for CCM LCSW to contact her to ask if she would consider seeing her again.  . Provided patient with information about available mental health providers that accept Medicaid such as [facility] [phone], [facility] [phone], [facility] [phone], [place], [facility] [phone] and [facility] [phone]. Patient wishes to gain therapy with her previous provider. CCM LCSW contacted Estill Bamberg and spoke with her directly. She is agreeable to start talk therapy again with patient and ask that patient contact her to get on her schedule. CCM LCSW completed return call back to patient and provided update and scheduled follow up appointment. Patient is agreeable to contact Estill Bamberg to schedule evaluation and initiate services.  . Discussed plans with patient for ongoing care management follow up and provided patient with direct contact information for care management team . Assisted patient/caregiver with obtaining information about health plan benefits . Provided education and assistance to client regarding Advanced Directives. . Emotional Supportive Provided, Problem Solving [place] , Psychoeducation /Health Education, and Motivational Interviewing  Patient Self Care Activities:  . Attends all scheduled provider appointments . Ability for insight . Independent living . Motivation for treatment  Patient Coping Strengths:  . Hopefulness . Self Advocate . Able to Communicate Effectively  Patient Self Care Deficits:  . Lacks social connections  Patient Goals:   Initial goal documentation            [name], BSW, MSW, [employer] Managed Medicaid LCSW [email or web address] Phone: [phone]

## 2021-01-04 NOTE — Telephone Encounter (Signed)
   Telephone encounter was:  Successful.  01/04/2021 Name: Monique [name] MRN: [id number] DOB: Sep 10, 1971  [name] is a 50 y.o. year old female who is a primary care patient of [name], MD . The community resource team was consulted for assistance with dme needs  Care guide performed the following interventions: Patient provided with information about care guide support team and interviewed to confirm resource needs Follow up call placed to the patient to discuss status of referral.  Follow Up Plan:  No further follow up planned at this time. The patient has been provided with needed resources. patient received massager for her neuropathy and is satisfied presently   [place], Care Management  [phone] [address] , [place] [address] Email : [email or web address]

## 2021-01-04 NOTE — Patient Outreach (Signed)
Medicaid Managed Care Social Work Note  01/04/2021 Name:  Monique [name] MRN:  [id number] DOB:  [birth year]  Monique [name] is an 50 y.o. year old female who is a primary patient of Monique Sizer, MD.  The Medicaid Managed Care Coordination team was consulted for assistance with:  [place] and Resources  Monique [name] was given information about Medicaid Managed CareCoordination services today. Monique [name] agreed to services and verbal consent obtained.  Engaged with patient  for by telephone forinitial visit in response to referral for case management and/or care coordination services.   Assessments/Interventions:  Review of past medical history, allergies, medications, health status, including review of consultants reports, laboratory and other test data, was performed as part of comprehensive evaluation and provision of chronic care management services.  SDOH: (Social Determinant of Health) assessments and interventions performed: SDOH Interventions   Flowsheet Row Most Recent Value  SDOH Interventions   Depression Interventions/Treatment  Medication, Counseling, Currently on Treatment      Advanced Directives Status:  See Care Plan for related entries.  Care Plan                 No Known Allergies  Medications Reviewed Today    Reviewed by Betha Loa, CMA (Certified Medical Assistant) on 01/01/21 at 1457  Med List Status: <None>  Medication Order Taking? Sig Documenting Provider Last Dose Status Informant  ADVAIR DISKUS 250-50 MCG/DOSE AEPB [id number] Yes Inhale 1 puff into the lungs 2 (two) times daily. Monique Sizer, MD Taking Active Self  albuterol (VENTOLIN HFA) 108 (90 Base) MCG/ACT inhaler [id number] Yes Inhale 2 puffs into the lungs every 4 (four) hours as needed for wheezing or shortness of breath. Monique Sizer, MD Taking Active Self  Ascorbic Acid (VITAMIN C) 1000 MG tablet [id number] Yes Take 1,000 mg by mouth daily as needed (immune  health). [provider] Taking Active Self  atorvastatin (LIPITOR) 10 MG tablet [id number] Yes Take 1 tablet (10 mg total) by mouth daily.  Patient taking differently: Take 10 mg by mouth in the morning.   Monique Sizer, MD Taking Active   Calcipotriene-Betameth Diprop (ENSTILAR) 0.005-0.064 % FOAM [id number] No Apply to aa's of the hands and feet BID x 2 weeks. Then decrease use to QD 5d/wk PRN.  Patient not taking: Reported on 01/01/2021   [name], MD Not Taking Active   Calcium Carbonate-Vit D-Min [facility] CALCIUM 1200) 1200-1000 MG-UNIT CHEW [id number] Yes Chew 1,200 mg by mouth daily with breakfast. Take in combination with vitamin D and magnesium.  Patient taking differently: Chew 2 tablets by mouth in the morning and at bedtime. Take in combination with vitamin D and magnesium.   [name], MD Taking Active   Crisaborole [facility]) 2 % OINT [id number] Yes Apply 1 application topically 2 (two) times daily.  Patient taking differently: Apply 1 application topically 2 (two) times daily. Hands and feet   [name], MD Taking Active   Cyanocobalamin (B-12) 500 MCG SUBL [id number] Yes Place 1 tablet under the tongue daily. Monique Sizer, MD Taking Active   DULoxetine (CYMBALTA) 20 MG capsule [id number] Yes Take 20 mg by mouth in the morning, at noon, and at bedtime. [provider] Taking Active Self  DULoxetine (CYMBALTA) 60 MG capsule [id number] No Take 1 capsule (60 mg total) by mouth daily.  Patient not taking: Reported on 01/01/2021   Monique Sizer, MD Not Taking Active   Estradiol (DIVIGEL) 1.25 MG/1.25GM GEL [id number] Yes Place 1  application onto the skin in the morning. Applied to inner thigh Rubie Maid, MD Taking Active   ferrous sulfate 324 (65 Fe) MG TBEC [id number] Yes Take 1 tablet (325 mg total) by mouth daily.  Patient taking differently: Take 325 mg by mouth 4 (four) times a week.   Monique Sizer, MD Taking Active   ibrutinib 420 MG  TABS [id number] Yes TAKE 1 TABLET BY MOUTH DAILY.  Patient taking differently: Take 420 mg by mouth in the morning.   Lloyd Huger, MD Taking Active   lidocaine-prilocaine (EMLA) cream [id number] Yes Apply 1 application topically as needed.  Patient taking differently: Apply 1 application topically as needed (skin irritation).   Monique Sizer, MD Taking Active   loratadine (CLARITIN) 10 MG tablet [id number] Yes Take 1 tablet (10 mg total) by mouth every morning. Monique Sizer, MD Taking Active Self  Magnesium 500 MG CAPS [id number] Yes Take 500 mg by mouth in the morning. [provider] Taking Active Self  meloxicam (MOBIC) 15 MG tablet [id number]  Take 1 tablet (15 mg total) by mouth daily.  Patient taking differently: Take 15 mg by mouth in the morning.   [name], MD  Expired 11/05/20 2359   omeprazole (PRILOSEC) 20 MG capsule [id number] Yes Take 1 capsule (20 mg total) by mouth daily.  Patient taking differently: Take 20 mg by mouth in the morning.   Monique Sizer, MD Taking Active   Oxymetazoline HCl (RHOFADE) 1 % CREA [id number] Yes Apply a thin coat to the entire face every morning.  Patient taking differently: Apply 1 application topically in the morning. Apply a thin coat to the entire face every morning.   [name], MD Taking Active   pregabalin (LYRICA) 100 MG capsule [id number] Yes Take 100 mg by mouth 3 (three) times daily. [provider] Taking Active Self  sertraline (ZOLOFT) 100 MG tablet [id number] Yes Take 150 mg by mouth in the morning. [provider] Taking Active Self  tiZANidine (ZANAFLEX) 2 MG tablet [id number]  Take 1 tablet (2 mg total) by mouth every 8 (eight) hours as needed for muscle spasms.  Patient taking differently: Take 2 mg by mouth 3 (three) times daily.   [name], MD  Expired 11/05/20 2359   Med List Note [name], [place] 02/04/19 1518): Ibrutinib filled at [facility]  05-13-18 09-29-18 Received permission from [name] to stop Imbruvica 7 days for procedure.          Patient Active Problem List   Diagnosis Date Noted  . Acute right hip pain 06/05/2020  . 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 Bilateral leg pain 11/03/2019  . Iron deficiency anemia 10/15/2019  . Bilateral arm pain 08/05/2019  . Bilateral hand numbness 08/05/2019  . Weakness of both hands 08/05/2019  . Chronic musculoskeletal pain 07/19/2019  . Chronic neuropathic pain 07/19/2019  . 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 Special screening for malignant neoplasms, colon   . Polyp of sigmoid colon   . Hemorrhoids   . Trigeminal neuralgia 10/05/2018  . Lumbar radiculitis (Right) 09/24/2018  . Hypocalcemia 06/03/2018  . Vitamin D insufficiency 06/03/2018  . Abnormal MRI, lumbar spine (06/02/2020) 06/03/2018  . Chronic hip pain (Right) 06/03/2018  . Spondylosis without myelopathy or radiculopathy, lumbar region 06/03/2018  . DDD (degenerative disc disease), lumbar 06/02/2018  . Lumbar facet hypertrophy 06/02/2018  . Lumbar facet arthropathy 06/02/2018  . Lumbar facet syndrome (Bilateral) (R>L) 06/02/2018  . Marijuana use  05/19/2018  . Chronic lower extremity pain (1ry area of Pain) (Bilateral) (R>L) 05/13/2018  . Chronic low back pain (2ry area of Pain) (Bilateral) (R>L) w/ sciatica (Bilateral) 05/13/2018  . Chronic pain syndrome 05/13/2018  . Opiate use 05/13/2018  . Disorder of skeletal system 05/13/2018  . Pharmacologic therapy 05/13/2018  . Problems influencing health status 05/13/2018  . Mastalgia 05/05/2018  . Breast mass, right 01/07/2018  . Face pain 12/11/2017  . Tingling 12/11/2017  . Thoracic aortic atherosclerosis ([place]) 08/20/2017  . Emphysema of lung ([place]) 08/20/2017  . Adductor tendinitis 04/23/2017  . Trochanteric bursitis of right hip 04/23/2017  . GERD without esophagitis 12/11/2016  . CLL (chronic lymphocytic leukemia) ([place]) 11/24/2016  .  Pap smear abnormality of cervix/human papillomavirus (HPV) positive 09/12/2016  . Stress incontinence 09/04/2016  . B12 deficiency 07/10/2015  . Insomnia, persistent 07/10/2015  . Anorexia nervosa, restricting type 07/10/2015  . Anxiety, generalized 07/10/2015  . H/O suicide attempt 07/10/2015  . Lymphocytosis 07/10/2015  . Obsessive-compulsive disorder 07/10/2015  . Tobacco use 07/10/2015  . History of cervical dysplasia 07/18/2014    Conditions to be addressed/monitored per PCP order:  Anxiety and Depression  Care Plan : General Social Work (Adult)  Updates made by Greg Cutter, LCSW since 01/04/2021 12:00 AM    Problem: Anxiety Identification (Anxiety)     Long-Range Goal: Patient is in need of therapy   Start Date: 01/04/2021  Note:   Karlea Mckibbin is a 50 y.o. year old female who sees Monique Sizer, MD for primary care. The  [facility] Managed Care team was consulted for assistance with Mental Health Concerns . Ms. Berrian was given information about Care Management services, agreed to services, and verbal consent for services was obtained.  Timeframe:  Long-Range Goal Priority:  Medium Start Date:   01/04/21                      Expected End Date:  03/06/21                    Follow Up Date - 01/09/21   - begin personal counseling - call and visit an old friend - check out volunteer opportunities - join a support group - laugh; watch a funny movie or comedian - learn and use visualization or guided imagery - perform a random act of kindness - practice relaxation or meditation daily - start or continue a personal journal - talk about feelings with a friend, family or spiritual advisor - practice positive thinking and self-talk    Why is this important?    When you are stressed, down or upset, your body reacts too.   For example, your blood pressure may get higher; you may have a headache or stomachache.   When your emotions get the best of you, your body's  ability to fight off cold and flu gets weak.   These steps will help you manage your emotions.     Current Barriers:  . Chronic Mental Health needs related to Anxiety and Depression . Limited social support, ADL IADL limitations, Mental Health Concerns , and Social Isolation . Suicidal Ideation/Homicidal Ideation: No  Clinical Social Work Goal(s):  Marland Kitchen Over the next 120 days, patient will work with SW monthly by telephone or in person to reduce or manage symptoms related to anxiety and depression . patient will work with CCM LCSW to address needs related to finding a long term therapist  . patient will experience  decrease in ED visits as evidenced by patient report and review of EMR. ED visits in last 6 months. . patient will attend all scheduled medical appointments as evidenced by patient report and care team review of appointment completion in EMR.   Interventions: . Patient interviewed and appropriate assessments performed: brief mental health assessment and PHQ 2 and 9 completed.  . Patient interviewed and appropriate assessments performed . Provided mental health counseling with regard to mental health management. Patient is experiencing ongoing depression and anxiety and is having a difficult time finding a mental health provider that will accept her for services. Patient was informed that [employer] declined her referral on 12/11/20. Patient has no current psychiatrist or counselor but reports that her PCP is agreeable to keep prescribing her psychotropic medications. Patient reports that she worked previously with a Milton Ferguson at the [facility] and wishes for CCM LCSW to contact her to ask if she would consider seeing her again.  . Provided patient with information about available mental health providers that accept Medicaid such as [facility] [phone], [facility] [phone], [facility] [phone], [place], [facility] [phone] and [facility] [phone]. Patient wishes to gain therapy with her previous provider. CCM LCSW contacted Estill Bamberg and spoke with her directly. She is agreeable to start talk therapy again with patient and ask that patient contact her to get on her schedule. CCM LCSW completed return call back to patient and provided update and scheduled follow up appointment. Patient is agreeable to contact Estill Bamberg to schedule evaluation and initiate services.  . Discussed plans with patient for ongoing care management follow up and provided patient with direct contact information for care management team . Assisted patient/caregiver with obtaining information about health plan benefits . Provided education and assistance to client regarding Advanced Directives. . Emotional Supportive Provided, Problem Solving [place] , Psychoeducation /Health Education, and Motivational Interviewing  Patient Self Care Activities:  . Attends all scheduled provider appointments . Ability for insight . Independent living . Motivation for treatment  Patient Coping Strengths:  . Hopefulness . Self Advocate . Able to Communicate Effectively  Patient Self Care Deficits:  . Lacks social connections  Patient Goals:   Initial goal documentation      Follow up:  Patient agrees to Care Plan and Follow-up.  Plan: The Managed Medicaid care management team will reach out to the patient again over the next 14 days.  Date/time of next scheduled Social Work care management/care coordination outreach:  01/11/21  [name], BSW, MSW, LCSW Managed Medicaid LCSW [email or web address] Phone: [phone]

## 2021-01-05 ENCOUNTER — Other Ambulatory Visit: Payer: Self-pay | Admitting: Obstetrics and Gynecology

## 2021-01-05 ENCOUNTER — Other Ambulatory Visit: Payer: Self-pay

## 2021-01-05 NOTE — Patient Instructions (Signed)
Hi Ms. Tindol, thank you for speaking with me today, I am glad you are feeling better.  Ms. Saltz was given information about Medicaid Managed Care team care coordination services as a part of their [facility] Medicaid benefit. Izumi Mixon verbally consented to engagement with the [facility] Managed Care team.   For questions related to your [facility] health plan, please call: [phone]  If you would like to schedule transportation through your [facility] plan, please call the following number at least 2 days in advance of your appointment: [phone]   Call the [place] at [phone], at any time, 24 hours a day, 7 days a week. If you are in danger or need immediate medical attention call 911.  Ms. Haddix - following are the goals we discussed in your visit today:  Goals Addressed            This Visit's Progress   . Cope with Chronic Pain       Timeframe:  Long-Range Goal Priority:  High Start Date:     08/31/20                        Expected End Date:     03/07/21               Follow Up Date: 02/04/21   - practice relaxation or meditation daily - use distraction techniques - use relaxation during pain  Will follow up regarding MRI and epidural.   Update 12/05/20:  Patient has not heard back from Dr. Adalberto Cole office regarding approval for MRI and epidural-patient will call office.  Also, patient is out of [place] trouble getting refilled. Pharmacy referral for patient to assist with medications.  Needs DME to assist with burning in her legs-will check with [facility] to see what they will cover. Update 01/05/21:  Patient has received no new updates on MRI/epidural. Patient has her medications and is taking as directed and patient has received DME-massager.     Patient verbalizes understanding of instructions provided today.   The Managed Medicaid care management team will reach out to the patient again over the next 30 days.  The  patient has been provided with contact information for the Managed Medicaid care management team and has been advised to call with any health related questions or concerns.   Aida Raider RN, BSN [place]  [facility] - Managed Medicaid High Risk [phone].  Following is a copy of your plan of care:  Patient Care Plan: Chronic Pain (Adult)    Problem Identified: Chronic Pain Management (Chronic Pain)   Priority: High  Onset Date: 01/05/2021    Long-Range Goal: Chronic Pain Managed   Start Date: 08/31/2020  Expected End Date: 03/07/2021  Recent Progress: Not on track  Priority: High  Note:    Long-Range Goal: Chronic Pain Managed      Start Date: 08/31/2020   Expected End Date: 03/07/21   This Visit's Progress: On track   Priority: High   Note:    Current Barriers:   Care Coordination needs related to chronic pain.  Patient needs MRI and epidural injection.  To date, insurance has denied approval.  Patient is out of [place] trouble getting refilled.  Update 01/05/21:  Patient has received refill of medications and is taking as directed.   Nurse Case Manager Clinical Goal(s):   Over the next 30 days, patient will work with provider to address needs  related to approval of needed services.  Update 09/25/20:  No approval of MRI and epidural to date by insurance.  Continues to take medications as needed.  Update 10/25/20: Patient states she will call her Provider to follow up on status of MRI and epidural.  Update 12/05/20:  No updates on MRI or epidural -patient has not heard anything from Dr. Adalberto Cole office and states she will call for an update as it has been since November that she has heard anything.  Update 01/05/21:  No new updates from patient.  Over the next 90 days, patient will attend all scheduled medical appointments:     Interventions:   Inter-disciplinary care team collaboration (see longitudinal plan of  care)  Evaluation of current treatment plan related to chronic pain and patient's adherence to plan as established by provider.  Reviewed medications with patient.  Discussed plans with patient for ongoing care management follow up and provided patient with direct contact information for care management team  Reviewed scheduled/upcoming provider appointments.  Pharmacy referral to review medications.  Update 01/05/21:  Patient has met with Pharmacist and continues to follow  Social Work referral for anxiety/depression, h/o eating disorder and self harm.  Update 01/05/21:  Patient has met with SW and therapy sessions scheduled.  Care Guide referral for DME  resources for patient in which [facility] will help pay for.  Update 01/05/21: Patient has received DME.   Patient Goals/Self-Care Activities  Over the next 30 days, patient will:   -Attends all scheduled provider appointments  Calls provider office for new concerns or questions.  Follow up with Dr. Dossie Arbour regarding MRI/epidural approval.     Follow Up Plan: The Managed Medicaid care management team will reach out to the patient again over the next 30 days.   The patient has been provided with contact information for the Managed Medicaid care management team and has been advised to call with any health related questions or concerns.   RNCM will follow up with provider regarding patient request for DME, chair that provides massage to legs.  Patient feels like this would help her chronic leg pain.  Update 10/25/20:  Patient has appointment with PCP 10/26/20 and will follow up with her regarding DME.    Update 01/05/21:  Patient has received DME.             Plan: RNCM will follow up with patient within 30 days.  Follow-up:  Patient agrees to Care Plan and Follow-up.

## 2021-01-05 NOTE — Patient Outreach (Signed)
Medicaid Managed Care   Nurse Care Manager Note  01/05/2021 Name:  Monique [name] MRN:  [id number] DOB:  1970/10/11  Monique [name] is an 50 y.o. year old female who is a primary patient of [name], MD.  The [facility] Managed Care Coordination team was consulted for assistance with:    chronic healthcare management needs.  Ms. Griffith was given information about Medicaid Managed Care Coordination team services today. Monique [name] agreed to services and verbal consent obtained.  Engaged with patient by telephone for follow up visit in response to provider referral for case management and/or care coordination services.   Assessments/Interventions:  Review of past medical history, allergies, medications, health status, including review of consultants reports, laboratory and other test data, was performed as part of comprehensive evaluation and provision of chronic care management services.  SDOH (Social Determinants of Health) assessments and interventions performed:   Care Plan  No Known Allergies  Medications Reviewed Today    Reviewed by Gayla Medicus, RN (Registered Nurse) on 01/05/21 at [place] List Status: <None>  Medication Order Taking? Sig Documenting Provider Last Dose Status Informant  ADVAIR DISKUS 250-50 MCG/DOSE AEPB [id number] No Inhale 1 puff into the lungs 2 (two) times daily. [name], MD Taking Active Self  albuterol (VENTOLIN HFA) 108 (90 Base) MCG/ACT inhaler [id number] No Inhale 2 puffs into the lungs every 4 (four) hours as needed for wheezing or shortness of breath. [name], MD Taking Active Self  Ascorbic Acid (VITAMIN C) 1000 MG tablet [id number] No Take 1,000 mg by mouth daily as needed (immune health). [provider] Taking Active Self  atorvastatin (LIPITOR) 10 MG tablet [id number] No Take 1 tablet (10 mg total) by mouth daily.  Patient taking differently: Take 10 mg by mouth in the morning.   [name], MD Taking  Active   Calcipotriene-Betameth Diprop (ENSTILAR) 0.005-0.064 % FOAM [id number] No Apply to aa's of the hands and feet BID x 2 weeks. Then decrease use to QD 5d/wk PRN.  Patient not taking: Reported on 01/01/2021   [name], MD Not Taking Active   Calcium Carbonate-Vit D-Min [facility] CALCIUM 1200) 1200-1000 MG-UNIT CHEW [id number] No Chew 1,200 mg by mouth daily with breakfast. Take in combination with vitamin D and magnesium.  Patient taking differently: Chew 2 tablets by mouth in the morning and at bedtime. Take in combination with vitamin D and magnesium.   [name], MD Taking Active   Crisaborole [facility]) 2 % OINT [id number] No Apply 1 application topically 2 (two) times daily.  Patient taking differently: Apply 1 application topically 2 (two) times daily. Hands and feet   [name], MD Taking Active   Cyanocobalamin (B-12) 500 MCG SUBL [id number] No Place 1 tablet under the tongue daily. [name], MD Taking Active   DULoxetine (CYMBALTA) 20 MG capsule [id number] No Take 20 mg by mouth in the morning, at noon, and at bedtime. [provider] Taking Active Self  DULoxetine (CYMBALTA) 60 MG capsule [id number] No Take 1 capsule (60 mg total) by mouth daily.  Patient not taking: Reported on 01/01/2021   [name], MD Not Taking Active   Estradiol (DIVIGEL) 1.25 MG/1.25GM GEL [id number] No Place 1 application onto the skin in the morning. Applied to inner thigh Monique Maid, MD Taking Active   ferrous sulfate 324 (65 Fe) MG TBEC [id number] No Take 1 tablet (325 mg total) by mouth daily.  Patient taking differently: Take 325 mg by mouth  4 (four) times a week.   [name], MD Taking Active   ibrutinib 420 MG TABS [id number]  Take 1 tablet (420 mg) by mouth daily. [name], RPH-CPP  Active   lidocaine-prilocaine (EMLA) cream [id number] No Apply 1 application topically as needed.  Patient taking differently: Apply 1 application topically as needed  (skin irritation).   [name], MD Taking Active   loratadine (CLARITIN) 10 MG tablet [id number] No Take 1 tablet (10 mg total) by mouth every morning. [name], MD Taking Active Self  Magnesium 500 MG CAPS [id number] No Take 500 mg by mouth in the morning. [provider] Taking Active Self  meloxicam (MOBIC) 15 MG tablet [id number] No Take 1 tablet (15 mg total) by mouth daily.  Patient taking differently: Take 15 mg by mouth in the morning.   [name], MD Taking Expired 11/05/20 2359   omeprazole (PRILOSEC) 20 MG capsule [id number] No Take 1 capsule (20 mg total) by mouth daily.  Patient taking differently: Take 20 mg by mouth in the morning.   [name], MD Taking Active   Oxymetazoline HCl (RHOFADE) 1 % CREA [id number] No Apply a thin coat to the entire face every morning.  Patient taking differently: Apply 1 application topically in the morning. Apply a thin coat to the entire face every morning.   [name], MD Taking Active   pregabalin (LYRICA) 100 MG capsule [id number] No Take 100 mg by mouth 3 (three) times daily. [provider] Taking Active Self  sertraline (ZOLOFT) 100 MG tablet [id number] No Take 150 mg by mouth in the morning. [provider] Taking Active Self  tiZANidine (ZANAFLEX) 2 MG tablet [id number] No Take 1 tablet (2 mg total) by mouth every 8 (eight) hours as needed for muscle spasms.  Patient taking differently: Take 2 mg by mouth 3 (three) times daily.   [name], MD Taking Expired 11/05/20 2359   Med List Note [name], [place] 02/04/19 1518): Ibrutinib filled at [facility] 05-13-18 09-29-18 Received permission from [name] to stop Imbruvica 7 days for procedure.          Patient Active Problem List   Diagnosis Date Noted  . Acute right hip pain 06/05/2020  . 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 Bilateral leg pain 11/03/2019  . Iron deficiency  anemia 10/15/2019  . Bilateral arm pain 08/05/2019  . Bilateral hand numbness 08/05/2019  . Weakness of both hands 08/05/2019  . Chronic musculoskeletal pain 07/19/2019  . Chronic neuropathic pain 07/19/2019  . 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 Special screening for malignant neoplasms, colon   . Polyp of sigmoid colon   . Hemorrhoids   . Trigeminal neuralgia 10/05/2018  . Lumbar radiculitis (Right) 09/24/2018  . Hypocalcemia 06/03/2018  . Vitamin D insufficiency 06/03/2018  . Abnormal MRI, lumbar spine (06/02/2020) 06/03/2018  . Chronic hip pain (Right) 06/03/2018  . Spondylosis without myelopathy or radiculopathy, lumbar region 06/03/2018  . DDD (degenerative disc disease), lumbar 06/02/2018  . Lumbar facet hypertrophy 06/02/2018  . Lumbar facet arthropathy 06/02/2018  . Lumbar facet syndrome (Bilateral) (R>L) 06/02/2018  . Marijuana use 05/19/2018  . Chronic lower extremity pain (1ry area of Pain) (Bilateral) (R>L) 05/13/2018  . Chronic low back pain (2ry area of Pain) (Bilateral) (R>L) w/ sciatica (Bilateral) 05/13/2018  . Chronic pain syndrome 05/13/2018  . Opiate use 05/13/2018  . Disorder of skeletal system 05/13/2018  . Pharmacologic therapy 05/13/2018  . Problems influencing  health status 05/13/2018  . Mastalgia 05/05/2018  . Breast mass, right 01/07/2018  . Face pain 12/11/2017  . Tingling 12/11/2017  . Thoracic aortic atherosclerosis ([place]) 08/20/2017  . Emphysema of lung ([place]) 08/20/2017  . Adductor tendinitis 04/23/2017  . Trochanteric bursitis of right hip 04/23/2017  . GERD without esophagitis 12/11/2016  . CLL (chronic lymphocytic leukemia) ([place]) 11/24/2016  . Pap smear abnormality of cervix/human papillomavirus (HPV) positive 09/12/2016  . Stress incontinence 09/04/2016  . B12 deficiency 07/10/2015  . Insomnia, persistent 07/10/2015  . Anorexia nervosa, restricting type 07/10/2015  . Anxiety, generalized 07/10/2015  . H/O suicide attempt 07/10/2015   . Lymphocytosis 07/10/2015  . Obsessive-compulsive disorder 07/10/2015  . Tobacco use 07/10/2015  . History of cervical dysplasia 07/18/2014    Conditions to be addressed/monitored per PCP order:  chronic healthcare management needs, anxiety, depression, carpal tunnel syndrome, bursitis, cervical cancer, CLL, tobacco use, chronic pain.  Care Plan : Chronic Pain (Adult)  Updates made by Gayla Medicus, RN since 01/05/2021 12:00 AM    Problem: Chronic Pain Management (Chronic Pain)   Priority: High  Onset Date: 01/05/2021    Long-Range Goal: Chronic Pain Managed   Start Date: 08/31/2020  Expected End Date: 03/07/2021  Recent Progress: Not on track  Priority: High  Note:    Long-Range Goal: Chronic Pain Managed      Start Date: 08/31/2020   Expected End Date: 03/07/21   This Visit's Progress: On track   Priority: High   Note:    Current Barriers:   Care Coordination needs related to chronic pain.  Patient needs MRI and epidural injection.  To date, insurance has denied approval.  Patient is out of [place] trouble getting refilled.  Update 01/05/21:  Patient has received refill of medications and is taking as directed.   Nurse Case Manager Clinical Goal(s):   Over the next 30 days, patient will work with provider to address needs related to approval of needed services.  Update 09/25/20:  No approval of MRI and epidural to date by insurance.  Continues to take medications as needed.  Update 10/25/20: Patient states she will call her Provider to follow up on status of MRI and epidural.  Update 12/05/20:  No updates on MRI or epidural -patient has not heard anything from Dr. Adalberto Cole office and states she will call for an update as it has been since November that she has heard anything.  Update 01/05/21:  No new updates from patient.  Over the next 90 days, patient will attend all scheduled medical appointments:     Interventions:   Inter-disciplinary care team  collaboration (see longitudinal plan of care)  Evaluation of current treatment plan related to chronic pain and patient's adherence to plan as established by provider.  Reviewed medications with patient.  Discussed plans with patient for ongoing care management follow up and provided patient with direct contact information for care management team  Reviewed scheduled/upcoming provider appointments.  Pharmacy referral to review medications.  Update 01/05/21:  Patient has met with Pharmacist and continues to follow  Social Work referral for anxiety/depression, h/o eating disorder and self harm.  Update 01/05/21:  Patient has met with SW and therapy sessions scheduled.  Care Guide referral for DME  resources for patient in which [facility] will help pay for.  Update 01/05/21: Patient has received DME.   Patient Goals/Self-Care Activities  Over the next 30 days, patient will:   -Attends all scheduled provider appointments  Calls provider office for  new concerns or questions.  Follow up with Dr. Dossie Arbour regarding MRI/epidural approval.     Follow Up Plan: The Managed Medicaid care management team will reach out to the patient again over the next 30 days.   The patient has been provided with contact information for the Managed Medicaid care management team and has been advised to call with any health related questions or concerns.   RNCM will follow up with provider regarding patient request for DME, chair that provides massage to legs.  Patient feels like this would help her chronic leg pain.  Update 10/25/20:  Patient has appointment with PCP 10/26/20 and will follow up with her regarding DME.    Update 01/05/21:  Patient has received DME.             Plan: RNCM will follow up with patient within 30 days.  Follow-up:  Patient agrees to Care Plan and Follow-up.      Follow Up:  Patient agrees to Care Plan and Follow-up.  Plan: The Managed Medicaid care management  team will reach out to the patient again over the next 30 days. and The patient has been provided with contact information for the Managed Medicaid care management team and has been advised to call with any health related questions or concerns.  Date/time of next scheduled RN care management/care coordination outreach:  02/01/21 at 0900.

## 2021-01-10 ENCOUNTER — Other Ambulatory Visit: Payer: Self-pay | Admitting: Licensed Clinical Social Worker

## 2021-01-10 ENCOUNTER — Telehealth: Payer: Self-pay | Admitting: Pharmacist

## 2021-01-10 NOTE — Patient Instructions (Signed)
Visit Information  Ms. Petrucci was given information about Medicaid Managed Care team care coordination services as a part of their [employer] Medicaid benefit. Cari Burgo verbally consented to engagement with the [facility] Managed Care team.   For questions related to your [facility] health plan, please call: [phone]  If you would like to schedule transportation through your [facility] plan, please call the following number at least 2 days in advance of your appointment: [phone]   Call the [place] at [phone], at any time, 24 hours a day, 7 days a week. If you are in danger or need immediate medical attention call 911.  Ms. Canty - following are the goals we discussed in your visit today:  Goals Addressed            This Visit's Progress   . Manage My Emotions       Donell Sliwinski is a 50 y.o. year old female who sees [name], MD for primary care. The  [facility] Managed Care team was consulted for assistance with Mental Health Concerns . Ms. Copenhaver was given information about Care Management services, agreed to services, and verbal consent for services was obtained.  Timeframe:  Long-Range Goal Priority:  Medium Start Date:   01/04/21                      Expected End Date:  03/06/21                    Follow Up Date - 01/09/21   - begin personal counseling - call and visit an old friend - check out volunteer opportunities - join a support group - laugh; watch a funny movie or comedian - learn and use visualization or guided imagery - perform a random act of kindness - practice relaxation or meditation daily - start or continue a personal journal - talk about feelings with a friend, family or spiritual advisor - practice positive thinking and self-talk    Why is this important?    When you are stressed, down or upset, your body reacts too.   For example, your blood pressure may get higher; you may have a headache or  stomachache.   When your emotions get the best of you, your body's ability to fight off cold and flu gets weak.   These steps will help you manage your emotions.     Current Barriers:  . Chronic Mental Health needs related to Anxiety and Depression . Limited social support, ADL IADL limitations, Mental Health Concerns , and Social Isolation . Suicidal Ideation/Homicidal Ideation: No  Clinical Social Work Goal(s):  Marland Kitchen Over the next 120 days, patient will work with SW monthly by telephone or in person to reduce or manage symptoms related to anxiety and depression . patient will work with CCM LCSW to address needs related to finding a long term therapist  . patient will experience decrease in ED visits as evidenced by patient report and review of EMR. ED visits in last 6 months. . patient will attend all scheduled medical appointments as evidenced by patient report and care team review of appointment completion in EMR.   Interventions: . Patient interviewed and appropriate assessments performed: brief mental health assessment and PHQ 2 and 9 completed.  . Patient interviewed and appropriate assessments performed . Provided mental health counseling with regard to mental health management. Patient is experiencing ongoing depression and anxiety and is having a difficult time  finding a mental health provider that will accept her for services. Patient was informed that [employer] declined her referral on 12/11/20. Patient has no current psychiatrist or counselor but reports that her PCP is agreeable to keep prescribing her psychotropic medications. Patient reports that she worked previously with a Milton Ferguson at the [facility] and wishes for CCM LCSW to contact her to ask if she would consider seeing her again. Update 01/10/21- Patient confirmed that she has her evaluation and counseling appointment with Milton Ferguson on 01/17/21. She reports that this appointment is in person and  confirms stable transportation to and from this.  . Provided patient with information about available mental health providers that accept Medicaid such as [facility] [phone], [facility] [phone], [facility] [phone], [place], [employer] [facility] [phone] and [facility] [phone]. Patient wishes to gain therapy with her previous provider. CCM LCSW contacted Estill Bamberg and spoke with her directly. She is agreeable to start talk therapy again with patient and ask that patient contact her to get on her schedule. CCM LCSW completed return call back to patient and provided update and scheduled follow up appointment. Patient is agreeable to contact Estill Bamberg to schedule evaluation and initiate services. [facility] LCSW confirmed with patient that appointment has been set for 01/17/21.  Marland Kitchen Discussed plans with patient for ongoing care management follow up and provided patient with direct contact information for care management team . LCSW discussed coping skills for anxiety. SW used empathetic and active and reflective listening, validated patient's feelings/concerns, and provided emotional support. LCSW provided self-care examples to help patient manage their multiple health conditions and improve her mood.  . Assisted patient/caregiver with obtaining information about health plan benefits . Provided education and assistance to client regarding Advanced Directives. . Emotional Supportive Provided, Problem Solving [place] , Psychoeducation /Health Education, and Motivational Interviewing  Patient Self Care Activities:  . Attends all scheduled provider appointments . Ability for insight . Independent living . Motivation for treatment  Patient Coping Strengths:  . Hopefulness . Self Advocate . Able to Communicate Effectively  Patient Self Care Deficits:  . Lacks social connections       [name], BSW,  MSW, [employer] Managed Medicaid LCSW [email or web address] Phone: [phone]

## 2021-01-10 NOTE — Telephone Encounter (Signed)
Oral Chemotherapy Pharmacist Encounter   Received phone call regarding patient's oral chemotherapy medication: Imbruvica (ibrutinib)  Patient reports that she had stopped taking medication on 01/02/21 due for upcoming surgery on 01/22/21. Re-educated patient that she was to only stop ibrutinib 7 days prior to surgery as previously educated and documented in notes from 01/01/21. Patient verbalized understanding of resuming ibrutinib today (01/10/21) with last dose being on 01/14/21. Patient will then hold medication 7 days prior to surgery, starting with first dose being held on 01/15/21.  Patient knows to call the office with questions or concerns.  Leron Croak, PharmD, BCPS Hematology/Oncology Clinical Pharmacist [facility] [phone] 01/10/2021 3:11 PM

## 2021-01-10 NOTE — Patient Outreach (Signed)
Medicaid Managed Care Social Work Note  01/10/2021 Name:  Monique [name] MRN:  [id number] DOB:  23-Mar-1971  Monique [name] is an 50 y.o. year old female who is a primary patient of Monique Sizer, Monique [name].  The Medicaid Managed Care Coordination team was consulted for assistance with:  [place] and Resources  Monique [name] was given information about Medicaid Managed CareCoordination services today. Monique [name] to services and verbal consent obtained.  Engaged with patient  for by telephone forfollow up visit in response to referral for case management and/or care coordination services.   Assessments/Interventions:  Review of past medical history, allergies, medications, health status, including review of consultants reports, laboratory and other test data, was performed as part of comprehensive evaluation and provision of chronic care management services.  SDOH: (Social Determinant of Health) assessments and interventions performed: SDOH Interventions   Flowsheet Row Most Recent Value  SDOH Interventions   SDOH Interventions for the Following Domains Depression, Stress  Stress Interventions Provide Counseling  Depression Interventions/Treatment  Currently on Treatment, Counseling      Advanced Directives Status:  Not addressed in this encounter.  Care Plan                 No Known Allergies  Medications Reviewed Today    Reviewed by Monique [name], Monique [name] (Pharmacy Technician) on 01/09/21 at 56  Med List Status: Complete  Medication Order Taking? Sig Documenting Provider Last Dose Status Informant  ADVAIR DISKUS 250-50 MCG/DOSE AEPB [id number] Yes Inhale 1 puff into the lungs 2 (two) times daily.  Patient taking differently: Inhale 1 puff into the lungs in the morning.   Monique Sizer, Monique [name]  Active Self  albuterol (VENTOLIN HFA) 108 (90 Base) MCG/ACT inhaler [id number] Yes Inhale 2 puffs into the lungs every 4 (four) hours as needed for wheezing or  shortness of breath. Monique Sizer, Monique [name]  Active Self  Ascorbic Acid (VITAMIN C) 1000 MG tablet [id number] Yes Take 1,000 mg by mouth daily as needed (immune health). Provider, Historical, Monique [name]  Active Self  atorvastatin (LIPITOR) 10 MG tablet [id number] Yes Take 1 tablet (10 mg total) by mouth daily.  Patient taking differently: Take 10 mg by mouth in the morning.   Monique Sizer, Monique [name]  Active Self  Calcipotriene-Betameth Diprop (ENSTILAR) 0.005-0.064 % FOAM [id number] No Apply to aa's of the hands and feet BID x 2 weeks. Then decrease use to QD 5d/wk PRN.  Patient not taking: Reported on 01/09/2021   [name], Monique [name] Not Taking Unknown time Consider Medication Status and Discontinue (Patient Preference) Self  Calcium Carbonate-Vit D-Min [facility] CALCIUM 1200) 1200-1000 MG-UNIT CHEW [id number] Yes Chew 1,200 mg by mouth daily with breakfast. Take in combination with vitamin D and magnesium.  Patient taking differently: Chew 2 tablets by mouth in the morning, at noon, and at bedtime. Take in combination with vitamin D and magnesium.   [name], Monique [name]  Active Self  Crisaborole (EUCRISA) 2 % OINT [id number] Yes Apply 1 application topically 2 (two) times daily.  Patient taking differently: Apply 1 application topically 2 (two) times daily. Hands and feet   [name], Monique [name]  Active Self  Cyanocobalamin (B-12) 500 MCG SUBL [id number] No Place 1 tablet under the tongue daily.  Patient not taking: Reported on 01/09/2021   Monique Sizer, Monique [name] Not Taking Unknown time Consider Medication Status and Discontinue (Patient Preference) Self  DULoxetine (CYMBALTA) 20 MG capsule [id number] Yes Take 20 mg by mouth in the  morning, at noon, and at bedtime. Provider, Historical, Monique [name]  Active Self  DULoxetine (CYMBALTA) 60 MG capsule [id number]  Take 1 capsule (60 mg total) by mouth daily. Monique Sizer, Monique [name]  Consider Medication Status and Discontinue (Dose change) Self  Estradiol (DIVIGEL) 1.25 MG/1.25GM GEL  [id number] Yes Place 1 application onto the skin in the morning. Applied to inner thigh Rubie Maid, Monique [name]  Active Self  ferrous sulfate 324 (65 Fe) MG TBEC [id number] Yes Take 1 tablet (325 mg total) by mouth daily.  Patient taking differently: Take 324 mg by mouth 4 (four) times a week.   Monique Sizer, Monique [name]  Active Self  ibrutinib 420 MG TABS [id number]  Take 1 tablet (420 mg) by mouth daily. [name], RPH-CPP  Active Self           Med Note Kenton Kingfisher, Earley Favor   Tue Jan 09, 2021  1:41 PM) On hold per Monique [name] instruction  lidocaine-prilocaine (EMLA) cream [id number] No Apply 1 application topically as needed.  Patient not taking: Reported on 01/09/2021   Monique Sizer, Monique [name] Not Taking Unknown time Consider Medication Status and Discontinue (No longer needed (for PRN medications)) Self  loratadine (CLARITIN) 10 MG tablet [id number] Yes Take 1 tablet (10 mg total) by mouth every morning. Monique Sizer, Monique [name]  Active Self  Magnesium 500 MG CAPS [id number] Yes Take 500 mg by mouth in the morning. Provider, Historical, Monique [name]  Active Self  meloxicam (MOBIC) 15 MG tablet [id number] Yes Take 1 tablet (15 mg total) by mouth daily.  Patient taking differently: Take 15 mg by mouth in the morning.   [name], Monique [name]  Expired 11/05/20 2359   omeprazole (PRILOSEC) 20 MG capsule [id number] Yes Take 1 capsule (20 mg total) by mouth daily.  Patient taking differently: Take 20 mg by mouth in the morning.   Monique Sizer, Monique [name]  Active Self  Oxymetazoline HCl (RHOFADE) 1 % CREA [id number] Yes Apply a thin coat to the entire face every morning.  Patient taking differently: Apply 1 application topically in the morning. Apply a thin coat to the entire face every morning.   [name], Monique [name]  Active Self  pregabalin (LYRICA) 100 MG capsule [id number] Yes Take 100 mg by mouth 3 (three) times daily. Provider, Historical, Monique [name]  Active Self  sertraline (ZOLOFT) 100 MG tablet [id number] Yes Take 150 mg by mouth in the  morning. Provider, Historical, Monique [name]  Active Self  tiZANidine (ZANAFLEX) 2 MG tablet [id number] Yes Take 1 tablet (2 mg total) by mouth every 8 (eight) hours as needed for muscle spasms.  Patient taking differently: Take 2 mg by mouth 3 (three) times daily.   [name], Monique [name]  Expired 11/05/20 2359   Med List Note [name], [place] 02/04/19 1518): Ibrutinib filled at [facility] 05-13-18 09-29-18 Received permission from [name] to stop Imbruvica 7 days for procedure.          Patient Active Problem List   Diagnosis Date Noted  . Acute right hip pain 06/05/2020  . 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 Bilateral leg pain 11/03/2019  . Iron deficiency anemia 10/15/2019  . Bilateral arm pain 08/05/2019  . Bilateral hand numbness 08/05/2019  . Weakness of both hands 08/05/2019  . Chronic musculoskeletal pain 07/19/2019  . Chronic neuropathic pain 07/19/2019  . 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 Special screening for malignant neoplasms, colon   . Polyp of sigmoid colon   . Hemorrhoids   .  Trigeminal neuralgia 10/05/2018  . Lumbar radiculitis (Right) 09/24/2018  . Hypocalcemia 06/03/2018  . Vitamin D insufficiency 06/03/2018  . Abnormal MRI, lumbar spine (06/02/2020) 06/03/2018  . Chronic hip pain (Right) 06/03/2018  . Spondylosis without myelopathy or radiculopathy, lumbar region 06/03/2018  . DDD (degenerative disc disease), lumbar 06/02/2018  . Lumbar facet hypertrophy 06/02/2018  . Lumbar facet arthropathy 06/02/2018  . Lumbar facet syndrome (Bilateral) (R>L) 06/02/2018  . Marijuana use 05/19/2018  . Chronic lower extremity pain (1ry area of Pain) (Bilateral) (R>L) 05/13/2018  . Chronic low back pain (2ry area of Pain) (Bilateral) (R>L) w/ sciatica (Bilateral) 05/13/2018  . Chronic pain syndrome 05/13/2018  . Opiate use 05/13/2018  . Disorder of skeletal system 05/13/2018  . Pharmacologic therapy 05/13/2018  . Problems  influencing health status 05/13/2018  . Mastalgia 05/05/2018  . Breast mass, right 01/07/2018  . Face pain 12/11/2017  . Tingling 12/11/2017  . Thoracic aortic atherosclerosis ([place]) 08/20/2017  . Emphysema of lung ([place]) 08/20/2017  . Adductor tendinitis 04/23/2017  . Trochanteric bursitis of right hip 04/23/2017  . GERD without esophagitis 12/11/2016  . CLL (chronic lymphocytic leukemia) ([place]) 11/24/2016  . Pap smear abnormality of cervix/human papillomavirus (HPV) positive 09/12/2016  . Stress incontinence 09/04/2016  . B12 deficiency 07/10/2015  . Insomnia, persistent 07/10/2015  . Anorexia nervosa, restricting type 07/10/2015  . Anxiety, generalized 07/10/2015  . H/O suicide attempt 07/10/2015  . Lymphocytosis 07/10/2015  . Obsessive-compulsive disorder 07/10/2015  . Tobacco use 07/10/2015  . History of cervical dysplasia 07/18/2014    Conditions to be addressed/monitored per PCP order:  Anxiety and Depression  Care Plan : General Social Work (Adult)  Updates made by Monique Cutter, Monique [name] since 01/10/2021 12:00 AM    Problem: Anxiety Identification (Anxiety)     Long-Range Goal: Patient is in need of therapy   Start Date: 01/04/2021  Note:   Libra Gatz is a 50 y.o. year old female who sees Monique Sizer, Monique [name] for primary care. The  [facility] Managed Care team was consulted for assistance with Mental Health Concerns . Ms. Gilham was given information about Care Management services, [name] to services, and verbal consent for services was obtained.  Timeframe:  Long-Range Goal Priority:  Medium Start Date:   01/04/21                      Expected End Date:  03/06/21                    Follow Up Date - 01/09/21   - begin personal counseling - call and visit an old friend - check out volunteer opportunities - join a support group - laugh; watch a funny movie or comedian - learn and use visualization or guided imagery - perform a random act of kindness - practice  relaxation or meditation daily - start or continue a personal journal - talk about feelings with a friend, family or spiritual advisor - practice positive thinking and self-talk    Why is this important?    When you are stressed, down or upset, your body reacts too.   For example, your blood pressure may get higher; you may have a headache or stomachache.   When your emotions get the best of you, your body's ability to fight off cold and flu gets weak.   These steps will help you manage your emotions.     Current Barriers:  . Chronic Mental Health needs related to Anxiety  and Depression . Limited social support, ADL IADL limitations, Mental Health Concerns , and Social Isolation . Suicidal Ideation/Homicidal Ideation: No  Clinical Social Work Goal(s):  Marland Kitchen Over the next 120 days, patient will work with SW monthly by telephone or in person to reduce or manage symptoms related to anxiety and depression . patient will work with CCM Monique [name] to address needs related to finding a long term therapist  . patient will experience decrease in ED visits as evidenced by patient report and review of EMR. ED visits in last 6 months. . patient will attend all scheduled medical appointments as evidenced by patient report and care team review of appointment completion in EMR.   Interventions: . Patient interviewed and appropriate assessments performed: brief mental health assessment and PHQ 2 and 9 completed.  . Patient interviewed and appropriate assessments performed . Provided mental health counseling with regard to mental health management. Patient is experiencing ongoing depression and anxiety and is having a difficult time finding a mental health provider that will accept her for services. Patient was informed that [employer] declined her referral on 12/11/20. Patient has no current psychiatrist or counselor but reports that her PCP is agreeable to keep prescribing her psychotropic medications. Patient reports  that she worked previously with a Milton Ferguson at the [facility] and wishes for CCM Monique [name] to contact her to ask if she would consider seeing her again. Update 01/10/21- Patient confirmed that she has her evaluation and counseling appointment with Milton Ferguson on 01/17/21. She reports that this appointment is in person and confirms stable transportation to and from this.  . Provided patient with information about available mental health providers that accept Medicaid such as [facility] [phone], [facility] [phone], [facility] [phone], [place], [facility] [phone] and [facility] [phone]. Patient wishes to gain therapy with her previous provider. CCM Monique [name] contacted Estill Bamberg and spoke with her directly. She is agreeable to start talk therapy again with patient and ask that patient contact her to get on her schedule. CCM Monique [name] completed return call back to patient and provided update and scheduled follow up appointment. Patient is agreeable to contact Estill Bamberg to schedule evaluation and initiate services. [facility] Monique [name] confirmed with patient that appointment has been set for 01/17/21.  Marland Kitchen Discussed plans with patient for ongoing care management follow up and provided patient with direct contact information for care management team . Monique [name] discussed coping skills for anxiety. SW used empathetic and active and reflective listening, validated patient's feelings/concerns, and provided emotional support. Monique [name] provided self-care examples to help patient manage their multiple health conditions and improve her mood.  . Assisted patient/caregiver with obtaining information about health plan benefits . Provided education and assistance to client regarding Advanced Directives. . Emotional Supportive Provided, Problem Solving [place] , Psychoeducation /Health Education, and Motivational  Interviewing  Patient Self Care Activities:  . Attends all scheduled provider appointments . Ability for insight . Independent living . Motivation for treatment  Patient Coping Strengths:  . Hopefulness . Self Advocate . Able to Communicate Effectively  Patient Self Care Deficits:  . Lacks social connections        Follow up:  Patient agrees to Care Plan and Follow-up.  Plan: The Managed Medicaid care management team will reach out to the patient again over the next 7 days.  [name], BSW, MSW, [employer] Managed Medicaid Monique [name] [email or web address] Phone: [phone]

## 2021-01-11 ENCOUNTER — Ambulatory Visit: Payer: Self-pay

## 2021-01-12 ENCOUNTER — Other Ambulatory Visit (HOSPITAL_COMMUNITY): Payer: Self-pay

## 2021-01-15 ENCOUNTER — Other Ambulatory Visit (HOSPITAL_COMMUNITY): Payer: Self-pay

## 2021-01-15 ENCOUNTER — Other Ambulatory Visit: Payer: Self-pay

## 2021-01-15 ENCOUNTER — Telehealth: Payer: Self-pay | Admitting: Pain Medicine

## 2021-01-15 ENCOUNTER — Ambulatory Visit (INDEPENDENT_AMBULATORY_CARE_PROVIDER_SITE_OTHER): Payer: Medicaid Other | Admitting: Dermatology

## 2021-01-15 DIAGNOSIS — L209 Atopic dermatitis, unspecified: Secondary | ICD-10-CM

## 2021-01-15 DIAGNOSIS — L719 Rosacea, unspecified: Secondary | ICD-10-CM | POA: Diagnosis not present

## 2021-01-15 DIAGNOSIS — L409 Psoriasis, unspecified: Secondary | ICD-10-CM | POA: Diagnosis not present

## 2021-01-15 MED ORDER — ESTRADIOL 0.05 MG/24HR TD PTWK
0.0500 mg | MEDICATED_PATCH | TRANSDERMAL | 12 refills | Status: DC
  Filled 2021-01-15: qty 4, 28d supply, fill #0

## 2021-01-15 MED ORDER — OPZELURA 1.5 % EX CREA: 1.0000 "application " | TOPICAL_CREAM | Freq: Every day | CUTANEOUS | 2 refills | Status: DC

## 2021-01-15 MED ORDER — RHOFADE 1 % EX CREA: TOPICAL_CREAM | CUTANEOUS | 3 refills | Status: DC

## 2021-01-15 NOTE — Patient Instructions (Signed)

## 2021-01-15 NOTE — Progress Notes (Signed)
   Follow-Up Visit   Subjective  Monique [name] is a 50 y.o. female who presents for the following: Follow-up (Patient here today for 6 week psoriasis and rosacea follow up. She is using [place] at hands and feet for psoriasis and is doing well. For rosacea she is using Rhofade daily. ).  The following portions of the chart were reviewed this encounter and updated as appropriate:   Tobacco  Allergies  Meds  Problems  Med Hx  Surg Hx  Fam Hx     Review of Systems:  No other skin or systemic complaints except as noted in HPI or Assessment and Plan.  Objective  Well appearing patient in no apparent distress; mood and affect are within normal limits.  A focused examination was performed including face; hands.. Relevant physical exam findings are noted in the Assessment and Plan.  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  May use both. If not improving, consider a trial of Otezla.   Discussed Adbry vs Dupixent vs Rinvoq for AD, biologics vs Otezla for psoriasis  Atopic dermatitis (eczema) is a chronic, relapsing, pruritic condition that can significantly affect quality of life. It is often associated with allergic rhinitis and/or asthma and can require treatment with topical medications, phototherapy, or in severe cases a biologic medication called Dupixent in older children and adults.  Psoriasis is a chronic non-curable, but treatable genetic/hereditary disease that may have other systemic features affecting other organ systems such as joints (Psoriatic Arthritis). It is associated with an increased risk of inflammatory  bowel disease, heart disease, non-alcoholic fatty liver disease, and depression.    Ordered Medications: Ruxolitinib Phosphate (OPZELURA) 1.5 % CREA  Other Related Medications Calcipotriene-Betameth Diprop (ENSTILAR) 0.005-0.064 % FOAM Crisaborole (EUCRISA) 2 % OINT  Rosacea face Continue Rhofade  Discussed BBL to treat redness Rosacea is a chronic progressive skin condition usually affecting the face of adults, causing redness and/or acne bumps. It is treatable but not curable. It sometimes affects the eyes (ocular rosacea) as well. It may respond to topical and/or systemic medication and can flare with stress, sun exposure, alcohol, exercise and some foods.  Daily application of broad spectrum spf 30+ sunscreen to face is recommended to reduce flares.  Reordered Medications Oxymetazoline HCl (RHOFADE) 1 % CREA  Return in about 2 months (around 03/17/2021) for Psoriasis, Rosacea.  Graciella Belton, RMA, am acting as scribe for [name], MD . Documentation: I have reviewed the above documentation for accuracy and completeness, and I agree with the above.  [name], MD

## 2021-01-15 NOTE — Telephone Encounter (Signed)
We have not seen this patient in 6 months. I have no idea who Ovid Curd is. Patient will need to either schedule an appointment or contact her PCP to refill this medication.

## 2021-01-16 ENCOUNTER — Encounter: Payer: Self-pay | Admitting: Dermatology

## 2021-01-16 ENCOUNTER — Telehealth: Payer: Self-pay

## 2021-01-16 ENCOUNTER — Ambulatory Visit
Admission: RE | Admit: 2021-01-16 | Discharge: 2021-01-16 | Disposition: A | Discharge status: Home / Self Care | Payer: Medicaid Other | Source: Ambulatory Visit | Attending: Family Medicine | Admitting: Family Medicine

## 2021-01-16 ENCOUNTER — Other Ambulatory Visit (HOSPITAL_COMMUNITY): Payer: Self-pay

## 2021-01-16 DIAGNOSIS — Z1231 Encounter for screening mammogram for malignant neoplasm of breast: Secondary | ICD-10-CM | POA: Diagnosis not present

## 2021-01-16 NOTE — Telephone Encounter (Signed)
Pt need a refill on tiZANidine

## 2021-01-17 ENCOUNTER — Encounter: Payer: Self-pay | Admitting: Licensed Clinical Social Worker

## 2021-01-17 ENCOUNTER — Ambulatory Visit: Payer: Medicaid Other | Admitting: Licensed Clinical Social Worker

## 2021-01-17 DIAGNOSIS — F331 Major depressive disorder, recurrent, moderate: Secondary | ICD-10-CM

## 2021-01-17 DIAGNOSIS — F4310 Post-traumatic stress disorder, unspecified: Secondary | ICD-10-CM

## 2021-01-17 DIAGNOSIS — F411 Generalized anxiety disorder: Secondary | ICD-10-CM
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
## 2021-01-18 ENCOUNTER — Other Ambulatory Visit
Admission: RE | Admit: 2021-01-18 | Discharge: 2021-01-18 | Disposition: A | Discharge status: Home / Self Care | Payer: Medicaid Other | Source: Ambulatory Visit | Attending: Obstetrics and Gynecology | Admitting: Obstetrics and Gynecology

## 2021-01-18 ENCOUNTER — Other Ambulatory Visit: Payer: Self-pay

## 2021-01-18 DIAGNOSIS — Z20822 Contact with and (suspected) exposure to covid-19: Secondary | ICD-10-CM | POA: Insufficient documentation

## 2021-01-18 DIAGNOSIS — Z01812 Encounter for preprocedural laboratory examination: Secondary | ICD-10-CM | POA: Diagnosis not present

## 2021-01-18 LAB — TYPE AND SCREEN
ABO/RH(D): A POS
Antibody Screen: NEGATIVE

## 2021-01-18 LAB — SARS CORONAVIRUS 2 (TAT 6-24 HRS): SARS Coronavirus 2: NEGATIVE

## 2021-01-22 ENCOUNTER — Observation Stay
Admission: RE | Admit: 2021-01-22 | Discharge: 2021-01-23 | Disposition: A | Discharge status: Home / Self Care | Payer: Medicaid Other | Attending: Obstetrics and Gynecology | Admitting: Obstetrics and Gynecology

## 2021-01-22 ENCOUNTER — Encounter: Payer: Self-pay | Admitting: Obstetrics and Gynecology

## 2021-01-22 ENCOUNTER — Other Ambulatory Visit: Payer: Self-pay

## 2021-01-22 ENCOUNTER — Encounter
Admission: RE | Disposition: A | Discharge status: Home / Self Care | Payer: Self-pay | Source: Home / Self Care | Attending: Obstetrics and Gynecology

## 2021-01-22 ENCOUNTER — Ambulatory Visit: Payer: Medicaid Other | Admitting: Urgent Care

## 2021-01-22 DIAGNOSIS — N871 Moderate cervical dysplasia: Secondary | ICD-10-CM | POA: Diagnosis not present

## 2021-01-22 DIAGNOSIS — F17210 Nicotine dependence, cigarettes, uncomplicated: Secondary | ICD-10-CM | POA: Diagnosis not present

## 2021-01-22 DIAGNOSIS — Z8616 Personal history of COVID-19: Secondary | ICD-10-CM | POA: Diagnosis not present

## 2021-01-22 DIAGNOSIS — Z856 Personal history of leukemia: Secondary | ICD-10-CM | POA: Insufficient documentation

## 2021-01-22 DIAGNOSIS — J449 Chronic obstructive pulmonary disease, unspecified: Secondary | ICD-10-CM | POA: Diagnosis not present

## 2021-01-22 DIAGNOSIS — N879 Dysplasia of cervix uteri, unspecified: Secondary | ICD-10-CM | POA: Diagnosis not present

## 2021-01-22 DIAGNOSIS — N8500 Endometrial hyperplasia, unspecified: Secondary | ICD-10-CM | POA: Insufficient documentation

## 2021-01-22 DIAGNOSIS — R87613 High grade squamous intraepithelial lesion on cytologic smear of cervix (HGSIL): Secondary | ICD-10-CM | POA: Diagnosis not present

## 2021-01-22 DIAGNOSIS — Z79899 Other long term (current) drug therapy: Secondary | ICD-10-CM | POA: Insufficient documentation

## 2021-01-22 DIAGNOSIS — Z90710 Acquired absence of both cervix and uterus: Secondary | ICD-10-CM | POA: Diagnosis present

## 2021-01-22 DIAGNOSIS — C9110 Chronic lymphocytic leukemia of B-cell type not having achieved remission: Secondary | ICD-10-CM

## 2021-01-22 HISTORY — PX: VAGINAL HYSTERECTOMY: SHX2639

## 2021-01-22 LAB — ABO/RH: ABO/RH(D): A POS

## 2021-01-22 LAB — PREGNANCY, URINE: Preg Test, Ur: NEGATIVE

## 2021-01-22 LAB — URINE DRUG SCREEN, QUALITATIVE (ARMC ONLY)
Amphetamines, Ur Screen: NOT DETECTED
Barbiturates, Ur Screen: NOT DETECTED
Benzodiazepine, Ur Scrn: NOT DETECTED
Cannabinoid 50 Ng, Ur ~~LOC~~: POSITIVE — AB
Cocaine Metabolite,Ur ~~LOC~~: NOT DETECTED
MDMA (Ecstasy)Ur Screen: NOT DETECTED
Methadone Scn, Ur: NOT DETECTED
Opiate, Ur Screen: NOT DETECTED
Phencyclidine (PCP) Ur S: NOT DETECTED
Tricyclic, Ur Screen: NOT DETECTED

## 2021-01-22 LAB — POCT PREGNANCY, URINE: Preg Test, Ur: NEGATIVE

## 2021-01-22 SURGERY — HYSTERECTOMY, VAGINAL
Anesthesia: General | Site: Vagina

## 2021-01-22 MED ORDER — FAMOTIDINE 20 MG PO TABS
ORAL_TABLET | ORAL | Status: AC
  Administered 2021-01-22: 20 mg
  Filled 2021-01-22: qty 1

## 2021-01-22 MED ORDER — OXYCODONE HCL 5 MG/5ML PO SOLN: 5.0000 mg | Freq: Once | ORAL | Status: DC | PRN

## 2021-01-22 MED ORDER — KETOROLAC TROMETHAMINE 30 MG/ML IJ SOLN
30.0000 mg | Freq: Four times a day (QID) | INTRAMUSCULAR | Status: DC
  Administered 2021-01-22 – 2021-01-23 (×3): 30 mg via INTRAVENOUS
  Filled 2021-01-22 (×3): qty 1

## 2021-01-22 MED ORDER — GABAPENTIN 300 MG PO CAPS
ORAL_CAPSULE | ORAL | Status: AC
  Filled 2021-01-22: qty 1

## 2021-01-22 MED ORDER — PHENYLEPHRINE HCL (PRESSORS) 10 MG/ML IV SOLN
INTRAVENOUS | Status: DC | PRN
  Administered 2021-01-22: 100 ug via INTRAVENOUS
  Administered 2021-01-22 (×2): 200 ug via INTRAVENOUS
  Administered 2021-01-22 (×3): 100 ug via INTRAVENOUS

## 2021-01-22 MED ORDER — MORPHINE SULFATE (PF) 4 MG/ML IV SOLN
INTRAVENOUS | Status: AC
  Filled 2021-01-22: qty 1

## 2021-01-22 MED ORDER — PROPOFOL 10 MG/ML IV BOLUS
INTRAVENOUS | Status: AC
  Filled 2021-01-22: qty 20

## 2021-01-22 MED ORDER — MORPHINE SULFATE (PF) 2 MG/ML IV SOLN: 1.0000 mg | INTRAVENOUS | Status: DC | PRN

## 2021-01-22 MED ORDER — KETOROLAC TROMETHAMINE 30 MG/ML IJ SOLN
INTRAMUSCULAR | Status: AC
  Filled 2021-01-22: qty 1

## 2021-01-22 MED ORDER — KETOROLAC TROMETHAMINE 30 MG/ML IJ SOLN
INTRAMUSCULAR | Status: DC | PRN
  Administered 2021-01-22: 30 mg via INTRAVENOUS

## 2021-01-22 MED ORDER — EPHEDRINE SULFATE 50 MG/ML IJ SOLN
INTRAMUSCULAR | Status: DC | PRN
  Administered 2021-01-22: 10 mg via INTRAVENOUS

## 2021-01-22 MED ORDER — MAGNESIUM CITRATE PO SOLN
1.0000 | Freq: Once | ORAL | Status: DC | PRN
  Filled 2021-01-22: qty 296

## 2021-01-22 MED ORDER — LIDOCAINE-EPINEPHRINE 1 %-1:100000 IJ SOLN
INTRAMUSCULAR | Status: AC
  Filled 2021-01-22: qty 1

## 2021-01-22 MED ORDER — DEXMEDETOMIDINE (PRECEDEX) IN NS 20 MCG/5ML (4 MCG/ML) IV SYRINGE
PREFILLED_SYRINGE | INTRAVENOUS | Status: AC
  Filled 2021-01-22: qty 5

## 2021-01-22 MED ORDER — ZOLPIDEM TARTRATE 5 MG PO TABS: 5.0000 mg | ORAL_TABLET | Freq: Every evening | ORAL | Status: DC | PRN

## 2021-01-22 MED ORDER — MIDAZOLAM HCL 2 MG/2ML IJ SOLN
INTRAMUSCULAR | Status: AC
  Filled 2021-01-22: qty 2

## 2021-01-22 MED ORDER — ONDANSETRON HCL 4 MG/2ML IJ SOLN
INTRAMUSCULAR | Status: DC | PRN
  Administered 2021-01-22: 4 mg via INTRAVENOUS

## 2021-01-22 MED ORDER — ACETAMINOPHEN 500 MG PO TABS
1000.0000 mg | ORAL_TABLET | Freq: Four times a day (QID) | ORAL | Status: DC
  Administered 2021-01-22 – 2021-01-23 (×4): 1000 mg via ORAL
  Filled 2021-01-22 (×4): qty 2

## 2021-01-22 MED ORDER — CELECOXIB 200 MG PO CAPS
ORAL_CAPSULE | ORAL | Status: AC
  Administered 2021-01-22: 400 mg via ORAL
  Filled 2021-01-22: qty 2

## 2021-01-22 MED ORDER — EPHEDRINE 5 MG/ML INJ
INTRAVENOUS | Status: AC
  Filled 2021-01-22: qty 10

## 2021-01-22 MED ORDER — FENTANYL CITRATE (PF) 100 MCG/2ML IJ SOLN
INTRAMUSCULAR | Status: AC
  Filled 2021-01-22: qty 2

## 2021-01-22 MED ORDER — MORPHINE SULFATE (PF) 4 MG/ML IV SOLN
2.0000 mg | INTRAVENOUS | Status: DC | PRN
  Administered 2021-01-22 (×2): 2 mg via INTRAVENOUS

## 2021-01-22 MED ORDER — BISACODYL 10 MG RE SUPP: 10.0000 mg | Freq: Every day | RECTAL | Status: DC | PRN

## 2021-01-22 MED ORDER — DOCUSATE SODIUM 100 MG PO CAPS
100.0000 mg | ORAL_CAPSULE | Freq: Two times a day (BID) | ORAL | Status: DC
  Administered 2021-01-22 – 2021-01-23 (×2): 100 mg via ORAL
  Filled 2021-01-22 (×2): qty 1

## 2021-01-22 MED ORDER — FENTANYL CITRATE (PF) 100 MCG/2ML IJ SOLN
INTRAMUSCULAR | Status: AC
  Administered 2021-01-22: 50 ug via INTRAVENOUS
  Filled 2021-01-22: qty 2

## 2021-01-22 MED ORDER — ALUM & MAG HYDROXIDE-SIMETH 200-200-20 MG/5ML PO SUSP: 30.0000 mL | ORAL | Status: DC | PRN

## 2021-01-22 MED ORDER — LACTATED RINGERS IV SOLN: INTRAVENOUS | Status: DC

## 2021-01-22 MED ORDER — DULOXETINE HCL 20 MG PO CPEP
20.0000 mg | ORAL_CAPSULE | Freq: Three times a day (TID) | ORAL | Status: DC
  Administered 2021-01-22 – 2021-01-23 (×3): 20 mg via ORAL
  Filled 2021-01-22 (×5): qty 1

## 2021-01-22 MED ORDER — NICOTINE 21 MG/24HR TD PT24
21.0000 mg | MEDICATED_PATCH | Freq: Every day | TRANSDERMAL | Status: DC
  Administered 2021-01-22: 21 mg via TRANSDERMAL
  Filled 2021-01-22: qty 1

## 2021-01-22 MED ORDER — PROPOFOL 10 MG/ML IV BOLUS
INTRAVENOUS | Status: DC | PRN
  Administered 2021-01-22: 150 mg via INTRAVENOUS
  Administered 2021-01-22: 30 mg via INTRAVENOUS

## 2021-01-22 MED ORDER — MIDAZOLAM HCL 2 MG/2ML IJ SOLN
INTRAMUSCULAR | Status: DC | PRN
  Administered 2021-01-22: 2 mg via INTRAVENOUS

## 2021-01-22 MED ORDER — GABAPENTIN 300 MG PO CAPS: 300.0000 mg | ORAL_CAPSULE | Freq: Once | ORAL | Status: DC

## 2021-01-22 MED ORDER — TIZANIDINE HCL 2 MG PO TABS
2.0000 mg | ORAL_TABLET | Freq: Three times a day (TID) | ORAL | Status: DC
  Administered 2021-01-22 – 2021-01-23 (×3): 2 mg via ORAL
  Filled 2021-01-22 (×5): qty 1

## 2021-01-22 MED ORDER — LIDOCAINE HCL (CARDIAC) PF 100 MG/5ML IV SOSY
PREFILLED_SYRINGE | INTRAVENOUS | Status: DC | PRN
  Administered 2021-01-22: 80 mg via INTRAVENOUS

## 2021-01-22 MED ORDER — SEVOFLURANE IN SOLN
RESPIRATORY_TRACT | Status: AC
  Filled 2021-01-22: qty 250

## 2021-01-22 MED ORDER — IBRUTINIB 420 MG PO TABS: 420.0000 mg | ORAL_TABLET | Freq: Every day | ORAL | Status: DC

## 2021-01-22 MED ORDER — VASOPRESSIN 20 UNIT/ML IV SOLN: INTRAVENOUS | Status: DC | PRN

## 2021-01-22 MED ORDER — ATORVASTATIN CALCIUM 10 MG PO TABS
10.0000 mg | ORAL_TABLET | Freq: Every morning | ORAL | Status: DC
  Administered 2021-01-23: 10 mg via ORAL
  Filled 2021-01-22 (×2): qty 1

## 2021-01-22 MED ORDER — SERTRALINE HCL 50 MG PO TABS: 150.0000 mg | ORAL_TABLET | Freq: Every morning | ORAL | Status: DC

## 2021-01-22 MED ORDER — ACETAMINOPHEN 500 MG PO TABS
ORAL_TABLET | ORAL | Status: AC
  Administered 2021-01-22: 1000 mg via ORAL
  Filled 2021-01-22: qty 2

## 2021-01-22 MED ORDER — SERTRALINE HCL 100 MG PO TABS
150.0000 mg | ORAL_TABLET | Freq: Every morning | ORAL | Status: DC
  Administered 2021-01-22 – 2021-01-23 (×2): 150 mg via ORAL
  Filled 2021-01-22 (×2): qty 2

## 2021-01-22 MED ORDER — DEXAMETHASONE SODIUM PHOSPHATE 10 MG/ML IJ SOLN
INTRAMUSCULAR | Status: DC | PRN
  Administered 2021-01-22: 10 mg via INTRAVENOUS

## 2021-01-22 MED ORDER — ENOXAPARIN SODIUM 40 MG/0.4ML IJ SOSY: 40.0000 mg | PREFILLED_SYRINGE | Freq: Once | INTRAMUSCULAR | Status: AC

## 2021-01-22 MED ORDER — ACETAMINOPHEN 500 MG PO TABS: 1000.0000 mg | ORAL_TABLET | Freq: Once | ORAL | Status: AC

## 2021-01-22 MED ORDER — FENTANYL CITRATE (PF) 100 MCG/2ML IJ SOLN
25.0000 ug | INTRAMUSCULAR | Status: DC | PRN
  Administered 2021-01-22: 50 ug via INTRAVENOUS

## 2021-01-22 MED ORDER — FAMOTIDINE 20 MG PO TABS: 20.0000 mg | ORAL_TABLET | Freq: Once | ORAL | Status: AC

## 2021-01-22 MED ORDER — ONDANSETRON HCL 4 MG/2ML IJ SOLN: 4.0000 mg | Freq: Once | INTRAMUSCULAR | Status: DC | PRN

## 2021-01-22 MED ORDER — SODIUM BICARBONATE 8.4 % IV SOLN
INTRAVENOUS | Status: AC
  Filled 2021-01-22: qty 50

## 2021-01-22 MED ORDER — VASOPRESSIN 20 UNIT/ML IV SOLN
INTRAVENOUS | Status: AC
  Filled 2021-01-22: qty 1

## 2021-01-22 MED ORDER — CELECOXIB 200 MG PO CAPS: 400.0000 mg | ORAL_CAPSULE | Freq: Once | ORAL | Status: AC

## 2021-01-22 MED ORDER — ENOXAPARIN SODIUM 40 MG/0.4ML IJ SOSY
PREFILLED_SYRINGE | INTRAMUSCULAR | Status: AC
  Administered 2021-01-22: 40 mg via SUBCUTANEOUS
  Filled 2021-01-22: qty 0.4

## 2021-01-22 MED ORDER — MENTHOL 3 MG MT LOZG
1.0000 | LOZENGE | OROMUCOSAL | Status: DC | PRN
  Filled 2021-01-22: qty 9

## 2021-01-22 MED ORDER — CEFAZOLIN SODIUM-DEXTROSE 2-4 GM/100ML-% IV SOLN
INTRAVENOUS | Status: AC
  Filled 2021-01-22: qty 100

## 2021-01-22 MED ORDER — MOMETASONE FURO-FORMOTEROL FUM 200-5 MCG/ACT IN AERO
2.0000 | INHALATION_SPRAY | Freq: Two times a day (BID) | RESPIRATORY_TRACT | Status: DC
  Administered 2021-01-22 – 2021-01-23 (×2): 2 via RESPIRATORY_TRACT
  Filled 2021-01-22: qty 8.8

## 2021-01-22 MED ORDER — BUPIVACAINE HCL (PF) 0.5 % IJ SOLN
INTRAMUSCULAR | Status: AC
  Filled 2021-01-22: qty 30

## 2021-01-22 MED ORDER — SENNOSIDES-DOCUSATE SODIUM 8.6-50 MG PO TABS
1.0000 | ORAL_TABLET | Freq: Every evening | ORAL | Status: DC | PRN
  Administered 2021-01-22: 1 via ORAL
  Filled 2021-01-22: qty 1

## 2021-01-22 MED ORDER — SODIUM CHLORIDE (PF) 0.9 % IJ SOLN
INTRAMUSCULAR | Status: AC
  Filled 2021-01-22: qty 50

## 2021-01-22 MED ORDER — OXYCODONE HCL 5 MG PO TABS: 5.0000 mg | ORAL_TABLET | Freq: Once | ORAL | Status: DC | PRN

## 2021-01-22 MED ORDER — LACTATED RINGERS IV SOLN
INTRAVENOUS | Status: DC
  Administered 2021-01-22: 100 mL/h via INTRAVENOUS

## 2021-01-22 MED ORDER — PREGABALIN 50 MG PO CAPS
100.0000 mg | ORAL_CAPSULE | Freq: Three times a day (TID) | ORAL | Status: DC
  Administered 2021-01-22 – 2021-01-23 (×3): 100 mg via ORAL
  Filled 2021-01-22 (×3): qty 2

## 2021-01-22 MED ORDER — ALBUTEROL SULFATE HFA 108 (90 BASE) MCG/ACT IN AERS
2.0000 | INHALATION_SPRAY | RESPIRATORY_TRACT | Status: DC | PRN
  Filled 2021-01-22: qty 6.7

## 2021-01-22 MED ORDER — SIMETHICONE 80 MG PO CHEW: 80.0000 mg | CHEWABLE_TABLET | Freq: Four times a day (QID) | ORAL | Status: DC | PRN

## 2021-01-22 MED ORDER — VASOPRESSIN 20 UNIT/ML IV SOLN
INTRAVENOUS | Status: DC | PRN
  Administered 2021-01-22: 10 mL via INTRAMUSCULAR

## 2021-01-22 MED ORDER — OXYCODONE HCL 5 MG PO TABS: 5.0000 mg | ORAL_TABLET | ORAL | Status: DC | PRN

## 2021-01-22 MED ORDER — CHLORHEXIDINE GLUCONATE 0.12 % MT SOLN
OROMUCOSAL | Status: AC
  Administered 2021-01-22: 15 mL
  Filled 2021-01-22: qty 15

## 2021-01-22 MED ORDER — PROMETHAZINE HCL 25 MG/ML IJ SOLN: 6.2500 mg | INTRAMUSCULAR | Status: DC | PRN

## 2021-01-22 MED ORDER — ROCURONIUM BROMIDE 100 MG/10ML IV SOLN
INTRAVENOUS | Status: DC | PRN
  Administered 2021-01-22: 50 mg via INTRAVENOUS
  Administered 2021-01-22: 20 mg via INTRAVENOUS

## 2021-01-22 MED ORDER — GLYCOPYRROLATE 0.2 MG/ML IJ SOLN
INTRAMUSCULAR | Status: DC | PRN
  Administered 2021-01-22: .2 mg via INTRAVENOUS

## 2021-01-22 MED ORDER — ONDANSETRON HCL 4 MG/2ML IJ SOLN: 4.0000 mg | Freq: Four times a day (QID) | INTRAMUSCULAR | Status: DC | PRN

## 2021-01-22 MED ORDER — FENTANYL CITRATE (PF) 100 MCG/2ML IJ SOLN
INTRAMUSCULAR | Status: DC | PRN
  Administered 2021-01-22: 50 ug via INTRAVENOUS
  Administered 2021-01-22: 25 ug via INTRAVENOUS
  Administered 2021-01-22 (×2): 50 ug via INTRAVENOUS

## 2021-01-22 MED ORDER — FENTANYL CITRATE (PF) 100 MCG/2ML IJ SOLN
25.0000 ug | INTRAMUSCULAR | Status: DC | PRN
  Administered 2021-01-22: 25 ug via INTRAVENOUS

## 2021-01-22 MED ORDER — ESTROGENS, CONJUGATED 0.625 MG/GM VA CREA
TOPICAL_CREAM | VAGINAL | Status: AC
  Filled 2021-01-22: qty 30

## 2021-01-22 MED ORDER — ONDANSETRON HCL 4 MG PO TABS: 4.0000 mg | ORAL_TABLET | Freq: Four times a day (QID) | ORAL | Status: DC | PRN

## 2021-01-22 MED ORDER — IBUPROFEN 600 MG PO TABS: 600.0000 mg | ORAL_TABLET | Freq: Four times a day (QID) | ORAL | Status: DC

## 2021-01-22 MED ORDER — PANTOPRAZOLE SODIUM 40 MG PO TBEC
40.0000 mg | DELAYED_RELEASE_TABLET | Freq: Every day | ORAL | Status: DC
  Administered 2021-01-22 – 2021-01-23 (×2): 40 mg via ORAL
  Filled 2021-01-22 (×2): qty 1

## 2021-01-22 MED ORDER — DEXMEDETOMIDINE (PRECEDEX) IN NS 20 MCG/5ML (4 MCG/ML) IV SYRINGE
PREFILLED_SYRINGE | INTRAVENOUS | Status: DC | PRN
  Administered 2021-01-22 (×2): 20 ug via INTRAVENOUS

## 2021-01-22 MED ORDER — CEFAZOLIN SODIUM-DEXTROSE 2-4 GM/100ML-% IV SOLN
2.0000 g | Freq: Once | INTRAVENOUS | Status: AC
  Administered 2021-01-22: 2 g via INTRAVENOUS

## 2021-01-22 SURGICAL SUPPLY — 64 items
BAG COUNTER SPONGE EZ (MISCELLANEOUS) IMPLANT
BAG URINE DRAIN 2000ML AR STRL (UROLOGICAL SUPPLIES) ×3 IMPLANT
BLADE SURG SZ11 CARB STEEL (BLADE) ×3 IMPLANT
CANISTER SUCT 1200ML W/VALVE (MISCELLANEOUS) ×3 IMPLANT
CATH FOLEY 2WAY  5CC 16FR (CATHETERS)
CATH ROBINSON RED A/P 16FR (CATHETERS) ×3 IMPLANT
CATH URTH 16FR FL 2W BLN LF (CATHETERS) IMPLANT
CHLORAPREP W/TINT 26 (MISCELLANEOUS) ×3 IMPLANT
CORD MONOPOLAR M/FML 12FT (MISCELLANEOUS) IMPLANT
COVER WAND RF STERILE (DRAPES) ×3 IMPLANT
DERMABOND ADVANCED (GAUZE/BANDAGES/DRESSINGS)
DERMABOND ADVANCED .7 DNX12 (GAUZE/BANDAGES/DRESSINGS) IMPLANT
DRAPE 3/4 80X56 (DRAPES) ×3 IMPLANT
DRAPE PERI LITHO V/GYN (MISCELLANEOUS) ×3 IMPLANT
ELECT CAUTERY BLADE 6.4 (BLADE) ×3 IMPLANT
ELECT REM PT RETURN 9FT ADLT (ELECTROSURGICAL) ×3
ELECTRODE REM PT RTRN 9FT ADLT (ELECTROSURGICAL) ×2 IMPLANT
GAUZE 4X4 16PLY RFD (DISPOSABLE) ×3 IMPLANT
GAUZE PACK 2X3YD (PACKING) IMPLANT
GLOVE SURG ENC MOIS LTX SZ6.5 (GLOVE) ×3 IMPLANT
GLOVE SURG ENC MOIS LTX SZ8 (GLOVE) ×3 IMPLANT
GLOVE SURG UNDER LTX SZ7 (GLOVE) ×3 IMPLANT
GOWN STRL REUS W/ TWL LRG LVL3 (GOWN DISPOSABLE) ×4 IMPLANT
GOWN STRL REUS W/TWL LRG LVL3 (GOWN DISPOSABLE) ×2
GOWN STRL REUS W/TWL XL LVL4 (GOWN DISPOSABLE) ×3 IMPLANT
GRASPER SUT TROCAR 14GX15 (MISCELLANEOUS) IMPLANT
IRRIGATION STRYKERFLOW (MISCELLANEOUS) IMPLANT
IRRIGATOR STRYKERFLOW (MISCELLANEOUS)
IV LACTATED RINGERS 1000ML (IV SOLUTION) IMPLANT
KIT PINK PAD W/HEAD ARE REST (MISCELLANEOUS) ×3
KIT PINK PAD W/HEAD ARM REST (MISCELLANEOUS) ×2 IMPLANT
KIT TURNOVER CYSTO (KITS) ×3 IMPLANT
LABEL OR SOLS (LABEL) ×3 IMPLANT
MANIFOLD NEPTUNE II (INSTRUMENTS) ×3 IMPLANT
NEEDLE HYPO 25GX1 SAFETY (NEEDLE) ×3 IMPLANT
NS IRRIG 500ML POUR BTL (IV SOLUTION) ×3 IMPLANT
PACK BASIN MINOR ARMC (MISCELLANEOUS) ×3 IMPLANT
PACK GYN LAPAROSCOPIC (MISCELLANEOUS) IMPLANT
PAD OB MATERNITY 4.3X12.25 (PERSONAL CARE ITEMS) ×3 IMPLANT
PAD PREP 24X41 OB/GYN DISP (PERSONAL CARE ITEMS) ×3 IMPLANT
POUCH ENDO CATCH 10MM SPEC (MISCELLANEOUS) IMPLANT
POUCH SPECIMEN RETRIEVAL 10MM (ENDOMECHANICALS) IMPLANT
RETRACTOR PHONTONGUIDE ADAPT (ADAPTER) IMPLANT
RETRACTOR YANK SUCT EIGR SABER (INSTRUMENTS) IMPLANT
SCISSORS METZENBAUM CVD 33 (INSTRUMENTS) IMPLANT
SET TUBE SMOKE EVAC HIGH FLOW (TUBING) IMPLANT
SHEARS HARMONIC ACE PLUS 36CM (ENDOMECHANICALS) IMPLANT
SLEEVE ENDOPATH XCEL 5M (ENDOMECHANICALS) IMPLANT
SOL PREP PVP 2OZ (MISCELLANEOUS) ×3
SOLUTION PREP PVP 2OZ (MISCELLANEOUS) ×2 IMPLANT
SUT CHROMIC 0 CT 1 (SUTURE) IMPLANT
SUT CHROMIC 1-0 (SUTURE) IMPLANT
SUT CHROMIC 2 0 CT 1 (SUTURE) IMPLANT
SUT VIC AB 0 CT1 27 (SUTURE) ×3
SUT VIC AB 0 CT1 27XCR 8 STRN (SUTURE) ×6 IMPLANT
SUT VIC AB 0 CT1 36 (SUTURE) ×3 IMPLANT
SUT VIC AB 3-0 SH 27 (SUTURE)
SUT VIC AB 3-0 SH 27X BRD (SUTURE) IMPLANT
SUT VICRYL 0 AB UR-6 (SUTURE) IMPLANT
SYR 10ML LL (SYRINGE) ×3 IMPLANT
SYR CONTROL 10ML LL (SYRINGE) ×3 IMPLANT
TROCAR ENDO BLADELESS 11MM (ENDOMECHANICALS) IMPLANT
TROCAR XCEL NON-BLD 5MMX100MML (ENDOMECHANICALS) IMPLANT
TROCAR XCEL UNIV SLVE 11M 100M (ENDOMECHANICALS) IMPLANT

## 2021-01-22 NOTE — H&P (Signed)
GYNECOLOGY PREOPERATIVE HISTORY AND PHYSICAL (Updated from 12/13/2020)  Subjective:  Monique [name] is a 50 y.o. [phone] postmenopausal female here for surgical management of moderate cervical dysplasia. She has a long-standing history of abnormal pap smears (~ 20 years intermittently), also with history of a LEEP.  She reports that she has grown tired of the continuous follow up of her abnormal pap smears, and is developing anxiety about the possibility of this progressing to cancer as she has already been diagnosed in the past with a different type of cancer (CLL). She would like to reduce her risk.   Previously scheduled for surgery last month, however reported diagnosis of the flu 1-2 days before her surgery was to occur.   Pap History:  10/26/2020: LGSIL.   08/31/2019: LGSIL. 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  09/12/2016: ASCUS HPV+. Colposcopy performed 10/17/2016 with benign ECC.  08/16/2015: LGSIL. Negative biopsy.    Proposed surgery: Total vaginal hysterectomy with bilateral salpingectomy   Pertinent Gynecological History: Menses: post-menopausal Last mammogram: normal Date: 08/2019.  Last pap: (see above)    OB History  Gravida Para Term Preterm AB Living  8 4 4   4 4   SAB IAB Ectopic Multiple Live Births  4       4    # Outcome Date GA Lbr Len/2nd Weight Sex Delivery Anes PTL Lv  8 SAB           7 SAB           6 SAB           5 SAB           4 Term           3 Term           2 Term           1 Term             Obstetric Comments  1st Menstrual Cycle:  10   1st Pregnancy:  19    Past Medical History:  Diagnosis Date  . Anxiety   . Bursitis    leg pain  . Carpal tunnel syndrome   . Cervical dysplasia    hx LEEP over 18 years ago.   . Chronic lymphocytic leukemia ([place]) 2018   Dr Grayland Ormond.  (in lymph nodes)  . COPD (chronic obstructive pulmonary disease) ([place])   . COVID-19 2021  . Depression   .  Eating disorder   . GERD (gastroesophageal reflux disease)   . History of self-harm   . Insomnia   . Obsession   . Tobacco abuse   . Vitamin B12 deficiency (non anemic)     Past Surgical History:  Procedure Laterality Date  . BREAST BIOPSY Right 01/05/2018   US guided biopsy of 2 areas and 1 lymph node, MIXED INFLAMMATION AND GIANT CELL REACTION  . CERVICAL BIOPSY  W/ LOOP ELECTRODE EXCISION    . COLONOSCOPY WITH PROPOFOL N/A 04/21/2019   Procedure: COLONOSCOPY WITH PROPOFOL;  Surgeon: Virgel Manifold, MD;  Location: [facility] ENDOSCOPY;  Service: Gastroenterology;  Laterality: N/A;  . OTHER SURGICAL HISTORY     scar tissue removed from vocal cords  . TUBAL LIGATION    . vocal cord surgery  2005    Family History  Problem Relation Age of Onset  . Cancer Mother        thyroid  . Alcohol  abuse Father   . Alcohol abuse Brother   . Depression Brother   . Bipolar disorder Brother   . ADD / ADHD Son   . Breast cancer Neg Hx     Social History   Socioeconomic History  . Marital status: Divorced    Spouse name: NA  . Number of children: 4  . Years of education: 65  . Highest education level: 12th grade  Occupational History  . Occupation: Disabled   Tobacco Use  . Smoking status: Current Every Day Smoker    Packs/day: 1.00    Years: 31.00    Pack years: 31.00    Types: Cigarettes    Start date: 09/25/1986  . Smokeless tobacco: Never Used  Vaping Use  . Vaping Use: Former  Substance and Sexual Activity  . Alcohol use: Not Currently    Alcohol/week: 0.0 standard drinks    Comment: rarely  . Drug use: Yes    Types: Marijuana  . Sexual activity: Yes    Partners: Male    Birth control/protection: None  Other Topics Concern  . Not on file  Social History Narrative   Patient is single and she and her 10yo son live together. Patient is currently on social security disability due to health challenges.     Dad is here from [place] to help her briefly. 3 older daughters  live nearby.   Social Determinants of Health   Financial Resource Strain: Low Risk   . Difficulty of Paying Living Expenses: Not hard at all  Food Insecurity: No Food Insecurity  . Worried About Charity fundraiser in the Last Year: Never true  . Ran Out of Food in the Last Year: Never true  Transportation Needs: No Transportation Needs  . Lack of Transportation (Medical): No  . Lack of Transportation (Non-Medical): No  Physical Activity: Inactive  . Days of Exercise per Week: 0 days  . Minutes of Exercise per Session: 30 min  Stress: Stress Concern Present  . Feeling of Stress : Very much  Social Connections: Socially Isolated  . Frequency of Communication with Friends and Family: More than three times a week  . Frequency of Social Gatherings with Friends and Family: More than three times a week  . Attends Religious Services: Never  . Active Member of Clubs or Organizations: No  . Attends Archivist Meetings: Never  . Marital Status: Divorced  Human resources officer Violence: Not At Risk  . Fear of Current or Ex-Partner: No  . Emotionally Abused: No  . Physically Abused: No  . Sexually Abused: No    No current facility-administered medications on file prior to encounter.   Current Outpatient Medications on File Prior to Encounter  Medication Sig Dispense Refill  . ADVAIR DISKUS 250-50 MCG/DOSE AEPB Inhale 1 puff into the lungs 2 (two) times daily. (Patient taking differently: Inhale 1 puff into the lungs in the morning.) 180 each 1  . albuterol (VENTOLIN HFA) 108 (90 Base) MCG/ACT inhaler Inhale 2 puffs into the lungs every 4 (four) hours as needed for wheezing or shortness of breath. 8.5 g 0  . Ascorbic Acid (VITAMIN C) 1000 MG tablet Take 1,000 mg by mouth daily as needed (immune health).    Marland Kitchen atorvastatin (LIPITOR) 10 MG tablet Take 1 tablet (10 mg total) by mouth daily. (Patient taking differently: Take 10 mg by mouth in the morning.) 90 tablet 1  . Calcium  Carbonate-Vit D-Min (GNP CALCIUM 1200) 1200-1000 MG-UNIT CHEW Chew 1,200  mg by mouth daily with breakfast. Take in combination with vitamin D and magnesium. (Patient taking differently: Chew 2 tablets by mouth in the morning, at noon, and at bedtime. Take in combination with vitamin D and magnesium.) 90 tablet 3  . Crisaborole (EUCRISA) 2 % OINT Apply 1 application topically 2 (two) times daily. (Patient taking differently: Apply 1 application topically 2 (two) times daily. Hands and feet) 60 g 3  . DULoxetine (CYMBALTA) 20 MG capsule Take 20 mg by mouth in the morning, at noon, and at bedtime.    . ferrous sulfate 324 (65 Fe) MG TBEC Take 1 tablet (325 mg total) by mouth daily. (Patient taking differently: Take 324 mg by mouth 4 (four) times a week.) 90 tablet 1  . loratadine (CLARITIN) 10 MG tablet Take 1 tablet (10 mg total) by mouth every morning. 90 tablet 3  . Magnesium 500 MG CAPS Take 500 mg by mouth in the morning.    . meloxicam (MOBIC) 15 MG tablet Take 1 tablet (15 mg total) by mouth daily. (Patient taking differently: Take 15 mg by mouth in the morning.) 30 tablet 2  . omeprazole (PRILOSEC) 20 MG capsule Take 1 capsule (20 mg total) by mouth daily. (Patient taking differently: Take 20 mg by mouth in the morning.) 90 capsule 1  . pregabalin (LYRICA) 100 MG capsule Take 100 mg by mouth 3 (three) times daily.    . sertraline (ZOLOFT) 100 MG tablet Take 150 mg by mouth in the morning.    Marland Kitchen tiZANidine (ZANAFLEX) 2 MG tablet Take 1 tablet (2 mg total) by mouth every 8 (eight) hours as needed for muscle spasms. (Patient taking differently: Take 2 mg by mouth 3 (three) times daily.) 90 tablet 2  . Calcipotriene-Betameth Diprop (ENSTILAR) 0.005-0.064 % FOAM Apply to aa's of the hands and feet BID x 2 weeks. Then decrease use to QD 5d/wk PRN. (Patient not taking: Reported on 01/09/2021) 60 g 3  . Cyanocobalamin (B-12) 500 MCG SUBL Place 1 tablet under the tongue daily. (Patient not taking: Reported on  01/09/2021) 90 tablet 1  . DULoxetine (CYMBALTA) 60 MG capsule Take 1 capsule (60 mg total) by mouth daily. 90 capsule 0  . ibrutinib 420 MG TABS Take 1 tablet (420 mg) by mouth daily. 28 tablet 5  . lidocaine-prilocaine (EMLA) cream Apply 1 application topically as needed. (Patient not taking: Reported on 01/09/2021) 30 g 1   No Known Allergies    Review of Systems Constitutional: No recent fever/chills/sweats Respiratory: No recent cough/bronchitis Cardiovascular: No chest pain Gastrointestinal: No recent nausea/vomiting/diarrhea Genitourinary: No UTI symptoms Hematologic/lymphatic:No history of coagulopathy or recent blood thinner use    Objective:   Blood pressure (!) 101/56, pulse 84, temperature (!) 97 F (36.1 C), temperature source Oral, resp. rate 14, height 5\' 7"  (1.702 m), weight 77.1 kg, SpO2 97 %. CONSTITUTIONAL: Well-developed, well-nourished female in no acute distress.  HENT:  Normocephalic, atraumatic, External right and left ear normal. Oropharynx is clear and moist EYES: Conjunctivae and EOM are normal. Pupils are equal, round, and reactive to light. No scleral icterus.  NECK: Normal range of motion, supple, no masses SKIN: Skin is warm and dry. No rash noted. Not diaphoretic. No erythema. No pallor. NEUROLOGIC: Alert and oriented to person, place, and time. Normal reflexes, muscle tone coordination. No cranial nerve deficit noted. PSYCHIATRIC: Normal mood and affect. Normal behavior. Normal judgment and thought content. CARDIOVASCULAR: Normal heart rate noted, regular rhythm RESPIRATORY: Effort and breath sounds normal, no  problems with respiration noted ABDOMEN: Soft, nontender, nondistended. PELVIC: Deferred MUSCULOSKELETAL: Normal range of motion. No edema and no tenderness. 2+ distal pulses.    Labs: Results for orders placed or performed during the hospital encounter of 01/22/21 (from the past 336 hour(s))  Pregnancy, urine POC   Collection Time: 01/22/21   6:16 AM  Result Value Ref Range   Preg Test, Ur NEGATIVE NEGATIVE  Urine Drug Screen, Qualitative ([place] only)   Collection Time: 01/22/21  6:20 AM  Result Value Ref Range   Tricyclic, Ur Screen NONE DETECTED NONE DETECTED   Amphetamines, Ur Screen NONE DETECTED NONE DETECTED   MDMA (Ecstasy)Ur Screen NONE DETECTED NONE DETECTED   Cocaine Metabolite,Ur [place] NONE DETECTED NONE DETECTED   Opiate, Ur Screen NONE DETECTED NONE DETECTED   Phencyclidine (PCP) Ur S NONE DETECTED NONE DETECTED   Cannabinoid 50 Ng, Ur [place] POSITIVE (A) NONE DETECTED   Barbiturates, Ur Screen NONE DETECTED NONE DETECTED   Benzodiazepine, Ur Scrn NONE DETECTED NONE DETECTED   Methadone Scn, Ur NONE DETECTED NONE DETECTED  ABO/Rh   Collection Time: 01/22/21  6:28 AM  Result Value Ref Range   ABO/RH(D) PENDING   Results for orders placed or performed during the hospital encounter of 01/18/21 (from the past 336 hour(s))  SARS CORONAVIRUS 2 (TAT 6-24 HRS) Nasopharyngeal Nasopharyngeal Swab   Collection Time: 01/18/21  8:57 AM   Specimen: Nasopharyngeal Swab  Result Value Ref Range   SARS Coronavirus 2 NEGATIVE NEGATIVE  Type and screen [place]   Collection Time: 01/18/21  9:08 AM  Result Value Ref Range   ABO/RH(D) A POS    Antibody Screen NEG    Sample Expiration 02/01/2021,2359    Extend sample reason      NO TRANSFUSIONS OR PREGNANCY IN THE PAST 3 MONTHS Performed at [facility], [address], [place], [place] [address]      Imaging Studies: MM 3D SCREEN BREAST BILATERAL  Result Date: 01/16/2021 CLINICAL DATA:  Screening. EXAM: DIGITAL SCREENING BILATERAL MAMMOGRAM WITH TOMOSYNTHESIS AND CAD TECHNIQUE: Bilateral screening digital craniocaudal and mediolateral oblique mammograms were obtained. Bilateral screening digital breast tomosynthesis was performed. The images were evaluated with computer-aided detection. COMPARISON:  Previous exam(s). ACR Breast Density Category  b: There are scattered areas of fibroglandular density. FINDINGS: There are no findings suspicious for malignancy. The images were evaluated with computer-aided detection. IMPRESSION: No mammographic evidence of malignancy. A result letter of this screening mammogram will be mailed directly to the patient. RECOMMENDATION: Screening mammogram in one year. (Code:SM-B-01Y) BI-RADS CATEGORY  1: Negative. Electronically Signed   By: Ammie Ferrier M.D.   On: 01/16/2021 11:45    Assessment:    1. History of cervical dysplasia   2. CLL (chronic lymphocytic leukemia) ([place])   3. Anxiety     Plan:   - Patient desires definitive management with hysterectomy. Previously discussed that in general definitive management is typically offered with persistent severe dysplasia, however in light of increasing anxiety with prior history of anxiety and depression and fear of development of another cancer as she has already been previously diagnosed with CLL in the past, can have discussion today.  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 Patient has a history of LEEP in the past. Would just like to proceed with definitive management.  - Counseling: Procedure, risks, reasons, benefits  and complications (including injury to bowel, bladder, major blood vessel, ureter, bleeding, possibility of transfusion, infection, or fistula formation) reviewed in detail. Likelihood of success in alleviating the patient's condition was discussed. Routine postoperative instructions will be reviewed with the patient and her family in detail after surgery.  The patient concurred with the proposed plan, giving informed written consent for the surgery.   - Preop testing ordered. May need Anesthesia clearance due to h/o Emphysema.  - Instructions reviewed, including NPO after midnight.   Rubie Maid, MD [facility]

## 2021-01-22 NOTE — Anesthesia Preprocedure Evaluation (Addendum)
Anesthesia Evaluation  Patient identified by MRN, date of birth, ID band Patient awake    Reviewed: Allergy & Precautions, NPO status , Patient's Chart, lab work & pertinent test results  History of Anesthesia Complications Negative for: history of anesthetic complications  Airway Mallampati: II       Dental  (+) Loose, Dental Advidsory Given, Teeth Intact   Pulmonary neg shortness of breath, neg sleep apnea, COPD,  COPD inhaler, neg recent URI, Current Smoker and Patient abstained from smoking.,           Cardiovascular (-) hypertension(-) Past MI and (-) CHF (-) dysrhythmias (-) Valvular Problems/Murmurs     Neuro/Psych neg Seizures PSYCHIATRIC DISORDERS Anxiety Depression  Neuromuscular disease    GI/Hepatic Neg liver ROS, GERD  Medicated,  Endo/Other  neg diabetes  Renal/GU negative Renal ROS     Musculoskeletal   Abdominal   Peds  Hematology   Anesthesia Other Findings Past Medical History: No date: Anxiety No date: Bursitis     Comment:  leg pain No date: Carpal tunnel syndrome No date: Cervical dysplasia     Comment:  hx LEEP over 18 years ago.  2018: Chronic lymphocytic leukemia ([place])     Comment:  Dr Grayland Ormond.  (in lymph nodes) No date: COPD (chronic obstructive pulmonary disease) ([place]) 2021: COVID-19 No date: Depression No date: Eating disorder No date: GERD (gastroesophageal reflux disease) No date: History of self-harm No date: Insomnia No date: Obsession No date: Tobacco abuse No date: Vitamin B12 deficiency (non anemic)   Reproductive/Obstetrics                             Anesthesia Physical  Anesthesia Plan  ASA: II  Anesthesia Plan: General   Post-op Pain Management:    Induction: Intravenous  PONV Risk Score and Plan: 2 and Ondansetron, Dexamethasone, Midazolam, Treatment may vary due to age or medical condition and Promethazine  Airway Management  Planned: Oral ETT  Additional Equipment:   Intra-op Plan:   Post-operative Plan: Extubation in OR  Informed Consent: I have reviewed the patients History and Physical, chart, labs and discussed the procedure including the risks, benefits and alternatives for the proposed anesthesia with the patient or authorized representative who has indicated his/her understanding and acceptance.       Plan Discussed with:   Anesthesia Plan Comments:         Anesthesia Quick Evaluation

## 2021-01-22 NOTE — Progress Notes (Addendum)
Pt transferred to rm 348 from PACU.  Pt states pain 7/10, VSS. Bed changed, patient washed up and able to ambulate to the bathroom with assistance. Abdominal binder placed. Shanon Brow, pts son notified and updated.

## 2021-01-22 NOTE — Progress Notes (Signed)
Patient states pain is a 8/10. Moving around in bed looks uncomfortable. When pain medication is given patient sleeps and oxygen drops. [name] places at 2L and wakes up moving around. Pain in lower abdomen.

## 2021-01-22 NOTE — Op Note (Signed)
Procedure(s): HYSTERECTOMY VAGINAL; BILATERAL SALPINGECTOMY Procedure Note  Monique [name] female 50 y.o. 01/22/2021  Indications: The patient is a 50 y.o. [id number] female with persistent cervical dysplasia (~ 20 years) and history of LEEP, desiring definitive management with hysterectomy . Last pap smear in 10/2020 was LGSIL. 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  Pre-operative Diagnosis: Persistent cervical dysplasia, history of LEEP. Previous tubal ligation surgery.  Post-operative Diagnosis: Same  Surgeon: Rubie Maid, MD  Assistants:  Jeannie Fend, MD. An experienced assistant was required given the standard of surgical care given the complexity of the case.  This assistant was needed for exposure, dissection, suctioning, retraction, instrument exchange, and for overall help during the procedure.  Anesthesia: General endotracheal anesthesia  ASA Class:   Procedure Details: The patient was seen in the Holding Room. The risks, benefits, complications, treatment options, and expected outcomes were discussed with the patient.  The patient concurred with the proposed plan, giving informed consent.  The site of surgery properly noted/marked. The patient was taken to the Operating Room, identified as Monique [name] and the procedure verified as Procedure(s) (LRB): HYSTERECTOMY VAGINAL; BILATERAL SALPINGECTOMY (N/A).  After induction of anesthesia, the patient was placed in dosal lithotomy position using candy cane stirrups, and draped and prepped in the usual sterile manner.  A straight catheterization was performed.   A weighted speculum was placed into the vagina and the cervix was grasped with a double-toothed tenaculum.  The cervix was then circumferentially injected with 10 cc of 1:10,000 epinephrine mixed with 50 ml normal saline.  The cervix was then circumferentially incised with the bovie.  The posterior cul-de  sac was then entered sharply with the Mayo scissors without difficulty.  The same procedure was performed anteriorly, and the bladder dissected of the pubovesical cervical fascia.   At this point, a Heaney clamp was placed over the uterosacaral ligaments on either side.  These were then transected and suture ligated with #0 Vicryl.  Hemostasis was assured.  The cardinal ligaments were then clamped on both sides, transected and suture ligated in a similar fashion. The uterine arteries and the broad ligament were then serially clamped with Heaney clamps, transected and suture ligated on both sides.  Both cornua were clamped with Heaney clamps, transected and the uterus was delivered.  These pedicles were then suture ligated.   Next, Babcock clamps were used to grasp the left and right fallopian tubes.The fallopian tubes had been previously surgically interrupted.  Curved Heaney clamps were placed across the fimbriated ends and curved scissors were used to excise the specimen from the Heaney clamp. The pedicles were doubly ligated bilaterally with 0 Vicryl and hemostasis noted to be achieved. No other abnormalities were noted in the pelvic cavity. After this, the posterior vaginal cuff was reperitonealized using a running locked suture of 0-chromic suture, incorporating the uterosacral ligaments. The vaginal peritoneum was then closed using a purse-string suture of -Vicryl. The remainder of the vaginal cuff was closed with figure-of-eight stitches of #0 Vicryl. Hemostasis was observed.   All instruments were then removed from the vagina.  The patient was then taken out of dorsal lithotomy position and awakened from general anesthesia.  The patient was taken to the PACU in stable condition.  Sponge, lap, needle, and instrument count was correct times two.     Findings: The uterus was approximately 6-7 week size. Fallopian tubes and ovaries appeared normal.  Estimated Blood Loss:  340 ml      Drains:  straight catheterization prior to procedure with  50 ml of clear urine         Total IV Fluids:  800 ml  Specimens: Uterus with cervix, segments of bilateral fallopian tubes         Implants: None         Complications:  None; patient tolerated the procedure well.         Disposition: PACU - hemodynamically stable.         Condition: stable   Rubie Maid, MD [facility]

## 2021-01-22 NOTE — Transfer of Care (Signed)
Immediate Anesthesia Transfer of Care Note  Patient: Monique [name]  Procedure(s) Performed: HYSTERECTOMY VAGINAL; BILATERAL SALPINGECTOMY (N/A Vagina )  Patient Location: PACU  Anesthesia Type:General  Level of Consciousness: drowsy  Airway & Oxygen Therapy: Patient Spontanous Breathing and Patient connected to face mask oxygen  Post-op Assessment: Report given to RN and Post -op Vital signs reviewed and stable  Post vital signs: Reviewed and stable  Last Vitals:  Vitals Value Taken Time  BP 110/55 01/22/21 1004  Temp 36.9 C 01/22/21 1004  Pulse 90 01/22/21 1010  Resp 28 01/22/21 1010  SpO2 98 % 01/22/21 1010  Vitals shown include unvalidated device data.  Last Pain:  Vitals:   01/22/21 1004  TempSrc:   PainSc: Asleep         Complications: No complications documented.

## 2021-01-22 NOTE — Anesthesia Procedure Notes (Signed)
Procedure Name: Intubation Date/Time: 01/22/2021 7:50 AM Performed by: Doreen Salvage, CRNA Pre-anesthesia Checklist: Patient identified, Patient being monitored, Timeout performed, Emergency Drugs available and Suction available Patient Re-evaluated:Patient Re-evaluated prior to induction Oxygen Delivery Method: Circle system utilized Preoxygenation: Pre-oxygenation with 100% oxygen Induction Type: IV induction Ventilation: Mask ventilation without difficulty Laryngoscope Size: Mac, 3 and McGraph Grade View: Grade I Tube type: Oral Tube size: 7.0 mm Number of attempts: 1 Airway Equipment and Method: Stylet Placement Confirmation: ETT inserted through vocal cords under direct vision,  positive ETCO2 and breath sounds checked- equal and bilateral Secured at: 21 cm Tube secured with: Tape Dental Injury: Teeth and Oropharynx as per pre-operative assessment

## 2021-01-23 ENCOUNTER — Encounter: Payer: Self-pay | Admitting: Obstetrics and Gynecology

## 2021-01-23 DIAGNOSIS — R87613 High grade squamous intraepithelial lesion on cytologic smear of cervix (HGSIL): Secondary | ICD-10-CM | POA: Diagnosis not present

## 2021-01-23 DIAGNOSIS — F17210 Nicotine dependence, cigarettes, uncomplicated: Secondary | ICD-10-CM | POA: Diagnosis not present

## 2021-01-23 DIAGNOSIS — N8500 Endometrial hyperplasia, unspecified: Secondary | ICD-10-CM | POA: Diagnosis not present

## 2021-01-23 DIAGNOSIS — Z856 Personal history of leukemia: Secondary | ICD-10-CM | POA: Diagnosis not present

## 2021-01-23 DIAGNOSIS — Z8616 Personal history of COVID-19: Secondary | ICD-10-CM | POA: Diagnosis not present

## 2021-01-23 DIAGNOSIS — J449 Chronic obstructive pulmonary disease, unspecified: Secondary | ICD-10-CM | POA: Diagnosis not present

## 2021-01-23 DIAGNOSIS — Z79899 Other long term (current) drug therapy: Secondary | ICD-10-CM | POA: Diagnosis not present

## 2021-01-23 LAB — CBC
HCT: 36.8 % (ref 36.0–46.0)
Hemoglobin: 12.4 g/dL (ref 12.0–15.0)
MCH: 31.9 pg (ref 26.0–34.0)
MCHC: 33.7 g/dL (ref 30.0–36.0)
MCV: 94.6 fL (ref 80.0–100.0)
Platelets: 143 10*3/uL — ABNORMAL LOW (ref 150–400)
RBC: 3.89 MIL/uL (ref 3.87–5.11)
RDW: 13.4 % (ref 11.5–15.5)
WBC: 9.7 10*3/uL (ref 4.0–10.5)
nRBC: 0 % (ref 0.0–0.2)

## 2021-01-23 LAB — CREATININE, SERUM
Creatinine, Ser: 0.88 mg/dL (ref 0.44–1.00)
GFR, Estimated: >60 mL/min (ref >60)

## 2021-01-23 MED ORDER — OXYCODONE HCL 5 MG PO TABS: 5.0000 mg | ORAL_TABLET | Freq: Four times a day (QID) | ORAL | 0 refills | Status: DC | PRN

## 2021-01-23 MED ORDER — ACETAMINOPHEN 500 MG PO TABS: 1000.0000 mg | ORAL_TABLET | Freq: Four times a day (QID) | ORAL | 0 refills | Status: DC | PRN

## 2021-01-23 MED ORDER — SERTRALINE HCL 100 MG PO TABS: 150.0000 mg | ORAL_TABLET | Freq: Every morning | ORAL | 0 refills | Status: DC

## 2021-01-23 NOTE — Progress Notes (Signed)
AVS was reviewed with patient and they verbalized undertstanding. Follow-up appointments, medications, and education were completed. Discharged to home and off the floor via axillary.

## 2021-01-23 NOTE — Progress Notes (Signed)
Post-Operative Day # 1, s/p total vaginal hysterectomy with bilateral salpingectomy. History of CLL, anxiety/depression, and moderate cervical dysplasia.   Subjective: no complaints, up ad lib, voiding, tolerating PO and + flatus  Objective: Temp:  [97.6 F (36.4 C)-98.4 F (36.9 C)] 97.6 F (36.4 C) (05/10 0258) Pulse Rate:  [63-97] 75 (05/10 0258) Resp:  [11-26] 18 (05/10 0258) BP: (99-129)/(46-87) 106/53 (05/10 0258) SpO2:  [93 %-100 %] 97 % (05/10 0258)  Physical Exam:  General: alert and no distress  Lungs: clear to auscultation bilaterally Heart: regular rate and rhythm, S1, S2 normal, no murmur, click, rub or gallop Abdomen: soft, non-tender; bowel sounds normal; no masses,  no organomegaly Pelvis:Bleeding: appropriate, scant Extremities: DVT Evaluation: No evidence of DVT seen on physical exam. Negative Homan's sign. No cords or calf tenderness. No significant calf/ankle edema.  CBC Latest Ref Rng & Units 01/23/2021 01/01/2021 12/22/2020  WBC 4.0 - 10.5 K/uL 9.7 12.1(H) 13.3(H)  Hemoglobin 12.0 - 15.0 g/dL 12.4 15.2(H) 14.5  Hematocrit 36.0 - 46.0 % 36.8 44.6 43.9  Platelets 150 - 400 K/uL 143(L) 150 158      Lab Results  Component Value Date   CREATININE 0.88 01/23/2021     Assessment/Plan: Doing well postoperatively. Regular diet as tolerated. Discontinue IVF.  Continue to encourage ambulation. Discharge home. Discharge planning discussed.      Rubie Maid, MD [facility]

## 2021-01-23 NOTE — Discharge Summary (Signed)
Gynecology Physician Postoperative Discharge Summary  Patient ID: Monique [name] MRN: [id number] DOB/AGE: 10/17/1970 50 y.o.  Admit Date: 01/22/2021 Discharge Date: 01/23/2021  Preoperative Diagnoses: Persistent moderate cervical dysplasia, Chronic lymphocytic leukemia, anxiety and depression  Procedures: Procedure(s) (LRB): HYSTERECTOMY VAGINAL; BILATERAL SALPINGECTOMY (N/A)  Hospital Course:  Monique [name] is a 50 y.o. [id number]  admitted for scheduled surgery.  She underwent the procedures as mentioned above, her operation was uncomplicated. For further details about surgery, please refer to the operative report. Patient had an uncomplicated postoperative course. By time of discharge on POD#1, her pain was controlled on oral pain medications; she was ambulating, voiding without difficulty, tolerating regular diet and passing flatus. She was deemed stable for discharge to home.   Significant Labs: CBC Latest Ref Rng & Units 01/23/2021 01/01/2021 12/22/2020  WBC 4.0 - 10.5 K/uL 9.7 12.1(H) 13.3(H)  Hemoglobin 12.0 - 15.0 g/dL 12.4 15.2(H) 14.5  Hematocrit 36.0 - 46.0 % 36.8 44.6 43.9  Platelets 150 - 400 K/uL 143(L) 150 158    Discharge Exam: Blood pressure (!) 106/53, pulse 75, temperature 97.6 F (36.4 C), temperature source Oral, resp. rate 18, height 5\' 7"  (1.702 m), weight 77.1 kg, SpO2 97 %. General appearance: alert and no distress  Resp: clear to auscultation bilaterally  Cardio: regular rate and rhythm  GI: soft, non-tender; bowel sounds normal; no masses, no organomegaly.  Incision: C/D/I, no erythema, no drainage noted Pelvic: scant blood on pad  Extremities: extremities normal, atraumatic, no cyanosis or edema and Homans sign is negative, no sign of DVT  Discharged Condition: Stable  Disposition: Discharge disposition: 01-Home or Self Care       Discharge Instructions    Discharge patient   Complete by: As directed    Discharge disposition: 01-Home or Self Care    Discharge patient date: 01/23/2021     Allergies as of 01/23/2021   No Known Allergies     Medication List    STOP taking these medications   B-12 500 MCG Subl     TAKE these medications   acetaminophen 500 MG tablet Commonly known as: TYLENOL Take 2 tablets (1,000 mg total) by mouth every 6 (six) hours as needed.   Advair Diskus 250-50 MCG/ACT Aepb Generic drug: fluticasone-salmeterol Inhale 1 puff into the lungs 2 (two) times daily. What changed: when to take this   albuterol 108 (90 Base) MCG/ACT inhaler Commonly known as: VENTOLIN HFA Inhale 2 puffs into the lungs every 4 (four) hours as needed for wheezing or shortness of breath.   atorvastatin 10 MG tablet Commonly known as: LIPITOR Take 1 tablet (10 mg total) by mouth daily. What changed: when to take this   DULoxetine 20 MG capsule Commonly known as: CYMBALTA Take 20 mg by mouth in the morning, at noon, and at bedtime.   DULoxetine 60 MG capsule Commonly known as: CYMBALTA Take 1 capsule (60 mg total) by mouth daily.   Enstilar 0.005-0.064 % Foam Generic drug: Calcipotriene-Betameth Diprop Apply to aa's of the hands and feet BID x 2 weeks. Then decrease use to QD 5d/wk PRN.   estradiol 0.05 mg/24hr patch Commonly known as: Climara Place 1 patch (0.05 mg total) onto the skin once a week. Use patch for 21 days on, then 7 days off until your hysterectomy, then can continue with no days off.   Eucrisa 2 % Oint Generic drug: Crisaborole Apply 1 application topically 2 (two) times daily. What changed: additional instructions   ferrous sulfate 324 (65  Fe) MG Tbec Take 1 tablet (325 mg total) by mouth daily. What changed:   how much to take  when to take this   GNP Calcium 1200 1200-1000 MG-UNIT Chew Chew 1,200 mg by mouth daily with breakfast. Take in combination with vitamin D and magnesium. What changed:   how much to take  when to take this   Imbruvica 420 MG Tabs Generic drug:  ibrutinib Take 1 tablet (420 mg) by mouth daily.   lidocaine-prilocaine cream Commonly known as: EMLA Apply 1 application topically as needed.   loratadine 10 MG tablet Commonly known as: CLARITIN Take 1 tablet (10 mg total) by mouth every morning.   Magnesium 500 MG Caps Take 500 mg by mouth in the morning.   meloxicam 15 MG tablet Commonly known as: MOBIC Take 1 tablet (15 mg total) by mouth daily. What changed: when to take this   omeprazole 20 MG capsule Commonly known as: PRILOSEC Take 1 capsule (20 mg total) by mouth daily. What changed: when to take this   Opzelura 1.5 % Crea Generic drug: Ruxolitinib Phosphate Apply 1 application topically daily. May use 1-2 times daily to hands and feet as needed.   oxyCODONE 5 MG immediate release tablet Commonly known as: Oxy IR/ROXICODONE Take 1 tablet (5 mg total) by mouth every 6 (six) hours as needed for moderate pain or severe pain (may take up to 2 tabs every 6 hrs for severe pain).   pregabalin 100 MG capsule Commonly known as: LYRICA Take 100 mg by mouth 3 (three) times daily.   Rhofade 1 % Crea Generic drug: Oxymetazoline HCl Apply a thin coat to the entire face every morning.   sertraline 100 MG tablet Commonly known as: ZOLOFT Take 1.5 tablets (150 mg total) by mouth in the morning.   tiZANidine 2 MG tablet Commonly known as: ZANAFLEX Take 1 tablet (2 mg total) by mouth every 8 (eight) hours as needed for muscle spasms. What changed: when to take this   vitamin C 1000 MG tablet Take 1,000 mg by mouth daily as needed (immune health).      Future Appointments  Date Time Provider [place]  01/31/2021 10:40 AM Milton Ferguson, LCSW [facility] None  02/01/2021  9:00 AM [facility] CARE MANAGER 2 [facility] None  03/13/2021  9:20 AM [name], MD [place] [facility]  04/02/2021  2:45 PM [facility] LAB [facility] None  04/04/2021  9:30 AM [name], MD [facility] None  05/22/2021  2:30 PM [facility]-CT [facility]  [facility]  07/05/2021  2:15 PM [facility] LAB [facility] None  07/05/2021  2:45 PM Grayland Ormond, Kathlene November, MD [facility] None  10/29/2021  9:40 AM [name], MD [facility]    Follow-up Information    Rubie Maid, MD. Schedule an appointment as soon as possible for a visit.   Specialties: Obstetrics and Gynecology, Radiology Why: 10-14 days for post-op check Contact information: [place] [address] [phone]               Total discharge time: 15 minutes   Signed:  Rubie Maid, MD [facility]

## 2021-01-23 NOTE — Discharge Instructions (Signed)
Vaginal Hysterectomy, Care After The following information offers guidance on how to care for yourself after your procedure. Your health care provider may also give you more specific instructions. If you have problems or questions, contact your health care provider. What can I expect after the procedure? After the procedure, it is common to have:  Pain in the lower abdomen and vagina.  Vaginal bleeding and discharge for up to 1 week. You will need to use a sanitary pad after this procedure.  Difficulty having a bowel movement (constipation).  Temporary problems emptying the bladder.  Tiredness (fatigue).  Poor appetite.  Less interest in sex.  Feelings of sadness or other emotions. If your ovaries were also removed, it is also common to have symptoms of menopause, such as hot flashes, night sweats, and lack of sleep (insomnia). Follow these instructions at home: Medicines  Take over-the-counter and prescription medicines only as told by your health care provider.  Do not take aspirin or NSAIDs, such as ibuprofen. These medicines can cause bleeding.  Ask your health care provider if the medicine prescribed to you: ? Requires you to avoid driving or using machinery. ? Can cause constipation. You may need to take these actions to prevent or treat constipation:  Drink enough fluid to keep your urine pale yellow.  Take over-the-counter or prescription medicines.  Eat foods that are high in fiber, such as beans, whole grains, and fresh fruits and vegetables.  Limit foods that are high in fat and processed sugars, such as fried or sweet foods.   Activity  Rest as told by your health care provider.  Return to your normal activities as told by your health care provider. Ask your health care provider what activities are safe for you  Avoid sitting for a long time without moving. Get up to take short walks every 1-2 hours. This is important to improve blood flow and breathing. Ask  for help if you feel weak or unsteady.  Try to have someone home with you for 1-2 weeks to help you with everyday chores.  Do not lift anything that is heavier than 10 lb (4.5 kg), or the limit that you are told, until your health care provider says that it is safe.  If you were given a sedative during the procedure, it can affect you for several hours. Do not drive or operate machinery until your health care provider says that it is safe.   Lifestyle  Do not use any products that contain nicotine or tobacco. These products include cigarettes, chewing tobacco, and vaping devices, such as e-cigarettes. These can delay healing after surgery. If you need help quitting, ask your health care provider.  Do not drink alcohol until your health care provider approves. General instructions  Do not douche, use tampons, or have sex for at least 6 weeks, or as told by your health care provider.  If you struggle with physical or emotional changes after your procedure, speak with your health care provider or a therapist.  The stitches inside your vagina will dissolve over time and do not need to be taken out.  Do not take baths, swim, or use a hot tub until your health care provider approves. You may only be allowed to take showers for 2-3 weeks  Wear compression stockings as told by your health care provider. These stockings help to prevent blood clots and reduce swelling in your legs.  Keep all follow-up visits. This is important. Contact a health care provider if:  Your pain medicine is not helping.  You have a fever.  You have nausea or vomiting that does not go away.  You feel dizzy.  You have blood, pus, or a bad-smelling discharge from your vagina more than 1 week after the procedure.  You continue to have trouble urinating 3-5 days after the procedure. Get help right away if:  You have severe pain in your abdomen or back.  You faint.  You have heavy vaginal bleeding and blood  clots, soaking through a sanitary pad in less than 1 hour.  You have chest pain or shortness of breath.  You have pain, swelling, or redness in your leg. These symptoms may represent a serious problem that is an emergency. Do not wait to see if the symptoms will go away. Get medical help right away. Call your local emergency services (911 in the [place] Do not drive yourself to the hospital. Summary  After the procedure, it is common to have pain, vaginal bleeding, constipation, temporary problems emptying your bladder, and feelings of sadness or other emotions.  Take over-the-counter and prescription medicines only as told by your health care provider.  Rest as told by your health care provider. Return to your normal activities as told by your health care provider.  Contact a health care provider if your pain medicine is not helping, or you have a fever, dizziness, or trouble urinating several days after the procedure.  Get help right away if you have severe pain in your abdomen or back, or if you faint, have heavy bleeding, or have chest pain or shortness of breath. This information is not intended to replace advice given to you by your health care provider. Make sure you discuss any questions you have with your health care provider. Document Revised: 05/05/2020 Document Reviewed: 05/05/2020 Elsevier Patient Education  [place].

## 2021-01-23 NOTE — Anesthesia Postprocedure Evaluation (Signed)
Anesthesia Post Note  Patient: Monique [name]  Procedure(s) Performed: HYSTERECTOMY VAGINAL; BILATERAL SALPINGECTOMY (N/A Vagina )  Patient location during evaluation: PACU Anesthesia Type: General Level of consciousness: awake and alert Pain management: pain level controlled Vital Signs Assessment: post-procedure vital signs reviewed and stable Respiratory status: spontaneous breathing, nonlabored ventilation and respiratory function stable Cardiovascular status: blood pressure returned to baseline and stable Postop Assessment: no apparent nausea or vomiting Anesthetic complications: no   No complications documented.   Last Vitals:  Vitals:   01/22/21 2307 01/23/21 0258  BP: (!) 102/59 (!) 106/53  Pulse: 68 75  Resp: 18 18  Temp: 36.4 C 36.4 C  SpO2: 93% 97%    Last Pain:  Vitals:   01/23/21 0258  TempSrc: Oral  PainSc:                  Tera Mater

## 2021-01-26 LAB — SURGICAL PATHOLOGY

## 2021-01-30 DIAGNOSIS — M79604 Pain in right leg: Secondary | ICD-10-CM | POA: Diagnosis not present

## 2021-01-30 DIAGNOSIS — G4489 Other headache syndrome: Secondary | ICD-10-CM | POA: Diagnosis not present

## 2021-01-30 DIAGNOSIS — M79601 Pain in right arm: Secondary | ICD-10-CM | POA: Diagnosis not present

## 2021-01-30 DIAGNOSIS — R202 Paresthesia of skin: Secondary | ICD-10-CM | POA: Diagnosis not present

## 2021-01-31 ENCOUNTER — Ambulatory Visit: Payer: Medicaid Other | Admitting: Licensed Clinical Social Worker

## 2021-02-01 ENCOUNTER — Ambulatory Visit (INDEPENDENT_AMBULATORY_CARE_PROVIDER_SITE_OTHER): Payer: Medicaid Other | Admitting: Obstetrics and Gynecology

## 2021-02-01 ENCOUNTER — Encounter: Payer: Self-pay | Admitting: Obstetrics and Gynecology

## 2021-02-01 ENCOUNTER — Other Ambulatory Visit: Payer: Self-pay | Admitting: Obstetrics and Gynecology

## 2021-02-01 ENCOUNTER — Other Ambulatory Visit: Payer: Self-pay

## 2021-02-01 VITALS — BP 114/54 | HR 103 | Ht 67.0 in | Wt 167.8 lb

## 2021-02-01 DIAGNOSIS — N871 Moderate cervical dysplasia: Secondary | ICD-10-CM

## 2021-02-01 DIAGNOSIS — Z90710 Acquired absence of both cervix and uterus: Secondary | ICD-10-CM

## 2021-02-01 DIAGNOSIS — K5909 Other constipation: Secondary | ICD-10-CM

## 2021-02-01 DIAGNOSIS — N951 Menopausal and female climacteric states: Secondary | ICD-10-CM

## 2021-02-01 DIAGNOSIS — Z09 Encounter for follow-up examination after completed treatment for conditions other than malignant neoplasm: Secondary | ICD-10-CM

## 2021-02-01 DIAGNOSIS — C9110 Chronic lymphocytic leukemia of B-cell type not having achieved remission: Secondary | ICD-10-CM

## 2021-02-01 MED ORDER — ESTRADIOL 0.06 MG/24HR TD PTWK: 1.0000 | MEDICATED_PATCH | TRANSDERMAL | 12 refills | Status: DC

## 2021-02-01 NOTE — Patient Instructions (Signed)
Hi Monique [name], thank you for speaking with me today.  Monique [name] was given information about Medicaid Managed Care team care coordination services as a part of their [facility] Medicaid benefit. Monique [name] verbally consented to engagement with the [facility] Managed Care team.   For questions related to your [facility] health plan, please call: [phone]  If you would like to schedule transportation through your [facility] plan, please call the following number at least 2 days in advance of your appointment: [phone].  Call the [place] at [phone], at any time, 24 hours a day, 7 days a week. If you are in danger or need immediate medical attention call 911.  Monique [name] - following are the goals we discussed in your visit today:  Goals Addressed            This Visit's Progress   . Cope with Chronic Pain       Timeframe:  Long-Range Goal Priority:  High Start Date:     08/31/20                        Expected End Date:     03/07/21               Follow Up Date: 03/04/21   - practice relaxation or meditation daily - use distraction techniques - use relaxation during pain  Will follow up regarding MRI and epidural.   Update 12/05/20:  Patient has not heard back from Dr. Adalberto Cole office regarding approval for MRI and epidural-patient will call office.  Also, patient is out of [place] trouble getting refilled. Pharmacy referral for patient to assist with medications.  Needs DME to assist with burning in her legs-will check with [facility] to see what they will cover. Update 01/05/21:  Patient has received no new updates on MRI/epidural. Patient has her medications and is taking as directed and patient has received DME-massager. Update 02/01/21:  No changes-stable-continues to use massager.       Patient verbalizes understanding of instructions provided today.   The Managed Medicaid care management team will reach out to the patient again  over the next 30 days.  The patient has been provided with contact information for the Managed Medicaid care management team and has been advised to call with any health related questions or concerns.   Aida Raider RN, BSN [place]  [facility] - Managed Medicaid High Risk [phone].  Following is a copy of your plan of care:  Patient Care Plan: Chronic Pain (Adult)    Problem Identified: Chronic Pain Management (Chronic Pain)   Priority: High  Onset Date: 01/05/2021    Long-Range Goal: Chronic Pain Managed   Start Date: 08/31/2020  Expected End Date: 03/07/2021  Recent Progress: Not on track  Priority: High  Note:    Long-Range Goal: Chronic Pain Managed      Start Date: 08/31/2020   Expected End Date: 03/07/21   This Visit's Progress: On track   Priority: High   Note:    Current Barriers:   Care Coordination needs related to chronic pain.  Patient needs MRI and epidural injection.  To date, insurance has denied approval.  Patient is out of [place] trouble getting refilled.  Update 01/05/21:  Patient has received refill of medications and is taking as directed.   Nurse Case Manager Clinical Goal(s):   Over the next 30 days, patient will work with provider to  address needs related to approval of needed services.  Update 09/25/20:  No approval of MRI and epidural to date by insurance.  Continues to take medications as needed.  Update 10/25/20: Patient states she will call her Provider to follow up on status of MRI and epidural.  Update 12/05/20:  No updates on MRI or epidural -patient has not heard anything from Dr. Adalberto Cole office and states she will call for an update as it has been since November that she has heard anything.  Update 01/05/21:  No new updates from patient.  Over the next 90 days, patient will attend all scheduled medical appointments:     Interventions:   Inter-disciplinary care team  collaboration (see longitudinal plan of care)  Evaluation of current treatment plan related to chronic pain and patient's adherence to plan as established by provider.  Reviewed medications with patient.  Discussed plans with patient for ongoing care management follow up and provided patient with direct contact information for care management team  Reviewed scheduled/upcoming provider appointments.  Pharmacy referral to review medications.  Update 01/05/21:  Patient has met with Pharmacist and continues to follow  Social Work referral for anxiety/depression, h/o eating disorder and self harm.  Update 01/05/21:  Patient has met with SW and therapy sessions scheduled.  Care Guide referral for DME  resources for patient in which [facility] will help pay for.  Update 01/05/21: Patient has received DME.   Patient Goals/Self-Care Activities  Over the next 30 days, patient will:   -Attends all scheduled provider appointments  Calls provider office for new concerns or questions.  Follow up with Dr. Dossie Arbour regarding MRI/epidural approval.     Follow Up Plan: The Managed Medicaid care management team will reach out to the patient again over the next 30 days.   The patient has been provided with contact information for the Managed Medicaid care management team and has been advised to call with any health related questions or concerns.   RNCM will follow up with provider regarding patient request for DME, chair that provides massage to legs.  Patient feels like this would help her chronic leg pain.  Update 10/25/20:  Patient has appointment with PCP 10/26/20 and will follow up with her regarding DME.    Update 01/05/21:  Patient has received DME.             Plan: RNCM will follow up with patient within 30 days.  Follow-up:  Patient agrees to Care Plan and Follow-up.     Problem Identified: Chronic Pain Management (Chronic Pain)   Priority: High  Onset Date: 01/05/2021     Long-Range Goal: Chronic Pain Managed   Start Date: 08/31/2020  Expected End Date: 03/07/2021  Recent Progress: Not on track  Priority: High  Note:   Current Barriers:  . Care Coordination needs related to chronic pain.  Patient needs MRI and epidural injection.  To date, insurance has denied approval.  Nurse Case Manager Clinical Goal(s):  Marland Kitchen Over the next 30 days, patient will work with provider to address needs related to approval of needed services. . Over the next 90 days, patient will attend all scheduled medical appointments:   Interventions:  . Inter-disciplinary care team collaboration (see longitudinal plan of care) . Evaluation of current treatment plan related to chronic pain and patient's adherence to plan as established by provider. Marland Kitchen Update 02/01/21:  Patient stable, using DME. Marland Kitchen Reviewed medications with patient. . Discussed plans with patient for ongoing care management follow  up and provided patient with direct contact information for care management team . Reviewed scheduled/upcoming provider appointments.  Patient Goals/Self-Care Activities Over the next 30 days, patient will:  -Attends all scheduled provider appointments Calls provider office for new concerns or questions  Follow Up Plan: The Managed Medicaid care management team will reach out to the patient again over the next 30 days.  The patient has been provided with contact information for the Managed Medicaid care management team and has been advised to call with any health related questions or concerns.

## 2021-02-01 NOTE — Progress Notes (Signed)
OBSTETRICS/GYNECOLOGY POST-OPERATIVE CLINIC VISIT  Subjective:     Monique [name] is a 50 y.o. female with CLL who presents to the clinic 2 weeks status post vaginal hysterectomy and bilateral salpingectomy for moderate cervical dysplasia. Eating a regular diet without difficulty. Bowel movements are abnormal with constipation despite use of stool softeners. The patient is not having any pain.  Does note some increased pelvic pressure.  Had some spotting but has resolved.   Reports that she has resumed her chemotherapeutic medications.  Also reports that her hot flushes have improved some on her hormonal patch. Would like to increase dose if possible.  The following portions of the patient's history were reviewed and updated as appropriate: allergies, current medications, past family history, past medical history, past social history, past surgical history and problem list.  Review of Systems Pertinent items noted in HPI and remainder of comprehensive ROS otherwise negative.    Objective:    BP (!) 114/54   Pulse (!) 103   Ht _0  (1.702 m)   Wt 167 lb 12.8 oz (76.1 kg)   LMP  (LMP Unknown)   BMI 26.28 kg/m  General:  alert and no distress  Abdomen: soft, bowel sounds active, non-tender  Pelvis:   deferred until 6 week check.    Pathology:  . SURGICAL PATHOLOGY 01/22/2021    Final-Edited                   Value:SURGICAL PATHOLOGY CASE: [id number] PATIENT: Monique [name] Surgical Pathology Report     Specimen Submitted: A. Uterus, cervix, bil fallopian tubes  Clinical History: Persistent cervical dysplasia      DIAGNOSIS: A. 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 SEE COMMENT. - ENDOMETRIUM: MILDLY PROLIFERATIVE. - NEGATIVE FOR ATYPIA / EIN AND MALIGNANCY. - MYOMETRIUM:      - NO SIGNIFICANT PATHOLOGIC ALTERATION. - BILATERAL  FALLOPIAN TUBES:      - NO SIGNIFICANT PATHOLOGIC ALTERATION.  Comment: The cervix is involved by the patient's known CLL/SLL.  The cells of interest are positive for CD5, CD20, and CD23.  They are negative for CD10 and cyclinD1. CD3 highlights background T cells.  Ki67 proliferation index appears elevated (approximately 70-80% of the lymphocytes), which may be concerning for more a more aggressive subtype of                          CLL. There is no definitive evidence of large cell/Richter transformation.  Clinical correlation is recommended.  IHC slides were prepared by [facility], [place], [place]. All controls stained appropriately.  This test was developed and its performance characteristics determined by [facility]. It has not been cleared or approved by the [place] Food and Drug Administration. The FDA does not require this test to go through premarket FDA review. This test is used for clinical purposes. It should not be regarded as investigational or for research. This laboratory is certified under the Clinical Laboratory Improvement Amendments (CLIA) as qualified to perform high complexity clinical laboratory testing.   GROSS DESCRIPTION: A. Labeled: Uterus with cervix and bilateral fallopian tubes Received: In formalin Collection time: 9:04 AM on 01/22/2021 Placed into formalin time: 9:26 AM on 01/22/2021 Weight: 70.7 grams Dimensions:      Fundus -7.1 cm (cervix  to fundus), 3.5 cm (cornu to cornu), 3.1 cm (anterior to posterior)      Cervix - 4.5 x 4.0 x 3.0 (length) cm Serosa: Serosa is red-tan and smooth with diffuse congestion. Cervix: Ectocervical mucosa is yellow-tan with mild focal congestion. Periphery of ectocervical mucosa appears tan-red and mildly congested. The slit-like cervical os measures 1.0 x 0.1 cm.  Anterior cervical margin is inked blue, posterior cervical margin is inked black Endocervix:  Endocervical canal is yellow-tan, ranging from 0.2 to 1.3 cm in diameter, dilating towards the cervical os.  No polyps or lesions are grossly identified. Endometrial cavity:      Dimensions -3.4 x 2.9 cm      Thickness -0.4 cm      Other findings -endometrium is yellow-tan, smooth and mildly glistening.  No polyps or lesions are grossly identified. Myometrium:     Thickness -1.9 cm     Other findings -myometrium is pink-tan, trabeculated, and glandular. No fibrotic nodules or definitive lesions are grossly identified.                          Adnexa:     Fallopian tube A            Measurements -2.0 cm in length x 2.2 cm in diameter (fimbriated portion), 0.9 cm in length x 0.3 cm in diameter (non-fimbriated segment)          Other findings -Received is a portion of fallopian tube with attached fimbriae. A second segment of fallopian tube is also identified, connecting to the previously mentioned segment by a portion of pink-tan soft tissue, displaying evidence of possible previous ligation.  Serosa is red-tan and congested, though otherwise grossly unremarkable.  Sectioning reveals an occluded lumen.      Left fallopian b           Measurements -1.0 cm in length x 0.5 cm in diameter (possible tube), 1.5 x 1.1 x 0.4 cm (entirety of tissue)           Other findings -Received is a single fragment of fimbriated tissue with possible tube.  A 0.5 cm paratubal cyst is identified, containing clear yellow fluid.  Serosa is red-tan.  Specimen is bisected to reveal a possible portion of tube. Other comments: No oth                         er tissue is identified within the container  Block summary: 1 -representative cervix, 12-3 o'clock 2-representative cervix, 3-6 o'clock 3-representative cervix, 6-9 o'clock 4-representative cervix, 9-12 o'clock 5-anterior endomyometrium, full-thickness 6-posterior endometrium, full-thickness 7-9 fallopian tube A, entirely submitted 10-fallopian tube  B, entirely submitted  BAS 01/22/21  Final Diagnosis performed by Betsy Pries, MD.   Electronically signed 01/26/2021 4:30:38PM The electronic signature indicates that the named Attending Pathologist has evaluated the specimen Technical component performed at [facility], [address] [place], [place] [address] Lab: [phone] Dir: Rush Farmer, MD, MMM  Professional component performed at [facility], [facility], [place], [place], [place] [address] Lab: [phone] Dir: Dellia Nims. Rubinas, MD    Assessment:   1. Postop check   2. S/P vaginal hysterectomy   3. Other constipation   4. Cervical dysplasia, moderate   5. CLL (chronic lymphocytic leukemia) ([place])   6. Menopausal vasomotor syndrome     Plan:   1. Advised on use of Miralax for constipation.  Will also increase patch dose  for vasomotor symptoms from 0.5 to 0.6 mg.  2. Wound care discussed. 3. Operative findings reviewed. Pathology report discussed. 4. Activity restrictions: no excessive bending, stooping, or squatting and no lifting more than 10-15 pounds 5. Anticipated return to work: 2 weeks if sedentary/work from home employment. 6 weeks for active physical employment. 6. Follow up: 4 weeks for final post-op check.    Rubie Maid, MD [facility]

## 2021-02-01 NOTE — Progress Notes (Signed)
Pt present for post op care after hysterectomy. Pt stated that she was doing well besides having left side abd pain.

## 2021-02-01 NOTE — Patient Instructions (Signed)
Constipation, Adult Constipation is when a person has trouble pooping (having a bowel movement). When you have this condition, you may poop fewer than 3 times a week. Your poop (stool) may also be dry, hard, or bigger than normal. Follow these instructions at home: Eating and drinking  Eat foods that have a lot of fiber, such as: ? Fresh fruits and vegetables. ? Whole grains. ? Beans.  Eat less of foods that are low in fiber and high in fat and sugar, such as: ? French fries. ? Hamburgers. ? Cookies. ? Candy. ? Soda.  Drink enough fluid to keep your pee (urine) pale yellow.   General instructions  Exercise regularly or as told by your doctor. Try to do 150 minutes of exercise each week.  Go to the restroom when you feel like you need to poop. Do not hold it in.  Take over-the-counter and prescription medicines only as told by your doctor. These include any fiber supplements.  When you poop: ? Do deep breathing while relaxing your lower belly (abdomen). ? Relax your pelvic floor. The pelvic floor is a group of muscles that support the rectum, bladder, and intestines (as well as the uterus in women).  Watch your condition for any changes. Tell your doctor if you notice any.  Keep all follow-up visits as told by your doctor. This is important. Contact a doctor if:  You have pain that gets worse.  You have a fever.  You have not pooped for 4 days.  You vomit.  You are not hungry.  You lose weight.  You are bleeding from the opening of the butt (anus).  You have thin, pencil-like poop. Get help right away if:  You have a fever, and your symptoms suddenly get worse.  You leak poop or have blood in your poop.  Your belly feels hard or bigger than normal (bloated).  You have very bad belly pain.  You feel dizzy or you faint. Summary  Constipation is when a person poops fewer than 3 times a week, has trouble pooping, or has poop that is dry, hard, or bigger than  normal.  Eat foods that have a lot of fiber.  Drink enough fluid to keep your pee (urine) pale yellow.  Take over-the-counter and prescription medicines only as told by your doctor. These include any fiber supplements. This information is not intended to replace advice given to you by your health care provider. Make sure you discuss any questions you have with your health care provider. Document Revised: 07/21/2019 Document Reviewed: 07/21/2019 Elsevier Patient Education  2021 Elsevier Inc.  

## 2021-02-01 NOTE — Patient Outreach (Signed)
Medicaid Managed Care   Nurse Care Manager Note  02/01/2021 Name:  Seymone Forlenza MRN:  [id number] DOB:  [birth year]  Monique [name] is an 50 y.o. year old female who is a primary patient of Monique Sizer, MD.  The [facility] Managed Care Coordination team was consulted for assistance with:    chronic healthcare management needs.  Monique [name] was given information about Medicaid Managed Care Coordination team services today. Monique [name] agreed to services and verbal consent obtained.  Engaged with patient by telephone for follow up visit in response to provider referral for case management and/or care coordination services.   Assessments/Interventions:  Review of past medical history, allergies, medications, health status, including review of consultants reports, laboratory and other test data, was performed as part of comprehensive evaluation and provision of chronic care management services.  SDOH (Social Determinants of Health) assessments and interventions performed:   Care Plan  No Known Allergies  Medications Reviewed Today    Reviewed by Gayla Medicus, RN (Registered Nurse) on 02/01/21 at [facility] List Status: <None>  Medication Order Taking? Sig Documenting Provider Last Dose Status Informant  acetaminophen (TYLENOL) 500 MG tablet [id number]  Take 2 tablets (1,000 mg total) by mouth every 6 (six) hours as needed. Monique Maid, MD  Active   ADVAIR DISKUS 250-50 MCG/DOSE AEPB [id number] No Inhale 1 puff into the lungs 2 (two) times daily.  Patient taking differently: Inhale 1 puff into the lungs in the morning.   Monique Sizer, MD 01/20/2021 Active   albuterol (VENTOLIN HFA) 108 (90 Base) MCG/ACT inhaler [id number] No Inhale 2 puffs into the lungs every 4 (four) hours as needed for wheezing or shortness of breath. Monique Sizer, MD 01/20/2021 Active Self  Ascorbic Acid (VITAMIN C) 1000 MG tablet [id number] No Take 1,000 mg by mouth daily as needed (immune health). [provider] Past Month Unknown time Active Self  atorvastatin (LIPITOR) 10 MG tablet [id number] No Take 1 tablet (10 mg total) by mouth daily.  Patient taking differently: Take 10 mg by mouth in the morning.   Monique Sizer, MD 01/20/2021 Active   Calcipotriene-Betameth Diprop (ENSTILAR) 0.005-0.064 % FOAM [id number] No Apply to aa's of the hands and feet BID x 2 weeks. Then decrease use to QD 5d/wk PRN.  Patient not taking: Reported on 01/09/2021   Monique Bathe, MD Not Taking Unknown time Active Self  Calcium Carbonate-Vit D-Min [facility] CALCIUM 1200) 1200-1000 MG-UNIT CHEW [id number] No Chew 1,200 mg by mouth daily with breakfast. Take in combination with vitamin D and magnesium.  Patient taking differently: Chew 2 tablets by mouth in the morning, at noon, and at bedtime. Take in combination with vitamin D and magnesium.   Monique Pointer, MD 01/20/2021 Active   Crisaborole (EUCRISA) 2 % OINT [id number] No Apply 1 application topically 2 (two) times daily.  Patient taking differently: Apply 1 application topically 2 (two) times daily. Hands and feet   Monique Bathe, MD 01/20/2021 Active   DULoxetine (CYMBALTA) 20 MG capsule [id number] No Take 20 mg by mouth in the morning, at noon, and at bedtime. [provider] Taking Active Self  DULoxetine (CYMBALTA) 60 MG capsule [id number] No Take 1 capsule (60 mg total) by mouth daily. Monique Sizer, MD 01/20/2021 Active Self  estradiol (CLIMARA) 0.05 mg/24hr patch [id number]  Place 1 patch (0.05 mg total) onto the skin once a week. Use patch for 21 days on, then 7 days off until your hysterectomy, then can continue  with no days off. Monique Maid, MD  Active   ferrous sulfate 324 (65 Fe) MG TBEC [id number] No Take 1 tablet (325 mg total) by mouth daily.  Patient taking differently: Take 324 mg by mouth 4 (four) times a week.   Monique Sizer, MD Past Week Unknown time Active   ibrutinib 420 MG TABS [id number]  Take 1 tablet (420 mg) by mouth  daily. Monique [name], RPH-CPP  Active Self           Med Note (Isha Seefeld, Shelby Dubin Feb 01, 2021  9:35 AM)    lidocaine-prilocaine (EMLA) cream [id number] No Apply 1 application topically as needed.  Patient not taking: Reported on 01/09/2021   Monique Sizer, MD Not Taking Unknown time Active Self  loratadine (CLARITIN) 10 MG tablet [id number] No Take 1 tablet (10 mg total) by mouth every morning. Monique Sizer, MD 01/20/2021 Active Self  Magnesium 500 MG CAPS [id number] No Take 500 mg by mouth in the morning. [provider] 01/20/2021 Active Self  meloxicam (MOBIC) 15 MG tablet [id number] No Take 1 tablet (15 mg total) by mouth daily.  Patient taking differently: Take 15 mg by mouth in the morning.   Monique Pointer, MD 01/20/2021 Expired 11/05/20 2359   omeprazole (PRILOSEC) 20 MG capsule [id number] No Take 1 capsule (20 mg total) by mouth daily.  Patient taking differently: Take 20 mg by mouth in the morning.   Monique Sizer, MD 01/20/2021 Active   oxyCODONE (OXY IR/ROXICODONE) 5 MG immediate release tablet [id number]  Take 1 tablet (5 mg total) by mouth every 6 (six) hours as needed for moderate pain or severe pain (may take up to 2 tabs every 6 hrs for severe pain). Monique Maid, MD  Active   Oxymetazoline HCl (RHOFADE) 1 % CREA [id number] No Apply a thin coat to the entire face every morning. Monique Bathe, MD 01/22/2021 0600 Active   pregabalin (LYRICA) 100 MG capsule [id number] No Take 100 mg by mouth 3 (three) times daily. [provider] 01/20/2021 Active Self  Ruxolitinib Phosphate (OPZELURA) 1.5 % CREA [id number] No Apply 1 application topically daily. May use 1-2 times daily to hands and feet as needed. Monique Bathe, MD 01/22/2021 Unknown time Active   sertraline (ZOLOFT) 100 MG tablet [id number]  Take 1.5 tablets (150 mg total) by mouth in the morning. Monique Maid, MD  Active   tiZANidine (ZANAFLEX) 2 MG tablet [id number] No Take 1 tablet (2 mg total) by mouth every  8 (eight) hours as needed for muscle spasms.  Patient taking differently: Take 2 mg by mouth 3 (three) times daily.   Monique Pointer, MD 01/20/2021 Expired 11/05/20 2359   Med List Note Monique [name], RPH-CPP 02/04/19 1518): Ibrutinib filled at [facility] 05-13-18 09-29-18 Received permission from [name] to stop Imbruvica 7 days for procedure.          Patient Active Problem List   Diagnosis Date Noted  . S/P vaginal hysterectomy 01/22/2021  . Acute right hip pain 06/05/2020  . 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 Bilateral leg pain 11/03/2019  . Iron deficiency anemia 10/15/2019  . Bilateral arm pain 08/05/2019  . Bilateral hand numbness 08/05/2019  . Weakness of both hands 08/05/2019  . Chronic musculoskeletal pain 07/19/2019  . Chronic neuropathic pain 07/19/2019  . 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 Special screening for malignant neoplasms, colon   . Polyp of sigmoid colon   . Hemorrhoids   .  Trigeminal neuralgia 10/05/2018  . Lumbar radiculitis (Right) 09/24/2018  . Hypocalcemia 06/03/2018  . Vitamin D insufficiency 06/03/2018  . Abnormal MRI, lumbar spine (06/02/2020) 06/03/2018  . Chronic hip pain (Right) 06/03/2018  . Spondylosis without myelopathy or radiculopathy, lumbar region 06/03/2018  . DDD (degenerative disc disease), lumbar 06/02/2018  . Lumbar facet hypertrophy 06/02/2018  . Lumbar facet arthropathy 06/02/2018  . Lumbar facet syndrome (Bilateral) (R>L) 06/02/2018  . Marijuana use 05/19/2018  . Chronic lower extremity pain (1ry area of Pain) (Bilateral) (R>L) 05/13/2018  . Chronic low back pain (2ry area of Pain) (Bilateral) (R>L) w/ sciatica (Bilateral) 05/13/2018  . Chronic pain syndrome 05/13/2018  . Opiate use 05/13/2018  . Disorder of skeletal system 05/13/2018  . Pharmacologic therapy 05/13/2018  . Problems influencing health status 05/13/2018  . Mastalgia 05/05/2018  . Breast mass, right 01/07/2018   . Face pain 12/11/2017  . Tingling 12/11/2017  . Thoracic aortic atherosclerosis ([place]) 08/20/2017  . Emphysema of lung ([place]) 08/20/2017  . Adductor tendinitis 04/23/2017  . Trochanteric bursitis of right hip 04/23/2017  . GERD without esophagitis 12/11/2016  . CLL (chronic lymphocytic leukemia) ([place]) 11/24/2016  . Pap smear abnormality of cervix/human papillomavirus (HPV) positive 09/12/2016  . Stress incontinence 09/04/2016  . B12 deficiency 07/10/2015  . Insomnia, persistent 07/10/2015  . Anorexia nervosa, restricting type 07/10/2015  . Anxiety, generalized 07/10/2015  . H/O suicide attempt 07/10/2015  . Lymphocytosis 07/10/2015  . Obsessive-compulsive disorder 07/10/2015  . Tobacco use 07/10/2015  . History of cervical dysplasia 07/18/2014    Conditions to be addressed/monitored per PCP order:  chronic healthcare management needs,  Anxiety, Bursitis, Carpal tunnel syndrome, Cervical cancer ([place]), Cervical intraepithelial neoplasia I, Chronic lymphocytic leukemia ([place]) (2018), Depression, Eating disorder, History of self-harm, Insomnia, Obsession, Pap smear abnormality of cervix with LGSIL, Tobacco abuse, and Vitamin B12 deficiency (non anemic).  Care Plan : Chronic Pain (Adult)  Updates made by Gayla Medicus, RN since 02/01/2021 12:00 AM    Problem: Chronic Pain Management (Chronic Pain)   Priority: High  Onset Date: 01/05/2021    Long-Range Goal: Chronic Pain Managed   Start Date: 08/31/2020  Expected End Date: 03/07/2021  Recent Progress: Not on track  Priority: High  Note:   Current Barriers:  . Care Coordination needs related to chronic pain.  Patient needs MRI and epidural injection.  To date, insurance has denied approval.  Nurse Case Manager Clinical Goal(s):  Marland Kitchen Over the next 30 days, patient will work with provider to address needs related to approval of needed services. . Over the next 90 days, patient will attend all scheduled medical appointments:    Interventions:  . Inter-disciplinary care team collaboration (see longitudinal plan of care) . Evaluation of current treatment plan related to chronic pain and patient's adherence to plan as established by provider. Marland Kitchen Update 02/01/21:  Patient stable, using DME. Marland Kitchen Reviewed medications with patient. . Discussed plans with patient for ongoing care management follow up and provided patient with direct contact information for care management team . Reviewed scheduled/upcoming provider appointments.  Patient Goals/Self-Care Activities Over the next 30 days, patient will:  -Attends all scheduled provider appointments Calls provider office for new concerns or questions  Follow Up Plan: The Managed Medicaid care management team will reach out to the patient again over the next 30 days.  The patient has been provided with contact information for the Managed Medicaid care management team and has been advised to call with any health related questions or concerns.  Follow Up:  Patient agrees to Care Plan and Follow-up.  Plan: The Managed Medicaid care management team will reach out to the patient again over the next 30 days. and The patient has been provided with contact information for the Managed Medicaid care management team and has been advised to call with any health related questions or concerns.  Date/time of next scheduled RN care management/care coordination outreach:  03/02/21 at 0900.

## 2021-02-07 ENCOUNTER — Telehealth (INDEPENDENT_AMBULATORY_CARE_PROVIDER_SITE_OTHER): Payer: Self-pay

## 2021-02-07 ENCOUNTER — Ambulatory Visit: Payer: Medicaid Other | Admitting: Licensed Clinical Social Worker

## 2021-02-07 DIAGNOSIS — F331 Major depressive disorder, recurrent, moderate: Secondary | ICD-10-CM

## 2021-02-07 DIAGNOSIS — F411 Generalized anxiety disorder: Secondary | ICD-10-CM

## 2021-02-07 DIAGNOSIS — F4310 Post-traumatic stress disorder, unspecified: Secondary | ICD-10-CM

## 2021-02-07 NOTE — Progress Notes (Signed)
Counselor/Therapist Progress Note  Patient ID: Monique [name], MRN: [id number],    Date: 02/07/2021  Time Spent: 20 minutes    Treatment Type: Psychotherapy  Reported Symptoms: continued vacillating from euthymia to low mood; anxiety, anxious thoughts  Mental Status Exam:  Appearance:   Well Groomed     Behavior:  Appropriate and Sharing  Motor:  Normal  Speech/Language:   Clear and Coherent and Normal Rate  Affect:  Congruent and Full Range  Mood:  normal  Thought process:  goal directed  Thought content:    WNL  Sensory/Perceptual disturbances:    WNL  Orientation:  oriented to person, place, time/date, situation and day of week  Attention:  Good  Concentration:  Good  Memory:  WNL  Fund of knowledge:   Good  Insight:    Good  Judgment:   Good  Impulse Control:  Good   Risk Assessment: Danger to Self:  No Self-injurious Behavior: No Danger to Others: No Duty to Warn:no Physical Aggression / Violence:No  Access to Firearms a concern: No  Gang Involvement:No   Subjective: Patient arrived late but was engaged and cooperative throughout the session using time effectively to discuss thoughts, feelings and treatment plan. Patient voices continued motivation for treatment and understanding of mood and anxiety issues. Patient is likely to benefit from future treatment because she is motivated to decrease symptoms and improve functioning.    Interventions: Cognitive Behavioral Therapy Checked in with patient and reviewed previous session, including assessment and goal of treatment. Reviewed behavior activation.  Explored patient's goal of treatment and worked collaboratively to develop CBT treatment plan. Praised patient for getting out to walk more regularly.  Provided support through active listening, validation of feelings, and highlighted patient's strengths.  Diagnosis:   ICD-10-CM   1. Major depressive disorder, recurrent episode, moderate (HCC)  F33.1   2. Generalized  anxiety disorder  F41.1   3. PTSD (post-traumatic stress disorder)  F43.10    Plan: Patient's goal of treatment is to feel better, to find out ways to get involved in things that will improve her emotional wellbeing.    Treatment Target: Increase activities - Behavioral activation      - Provide psychoeducation on Behavior Activation  - Behavior tracking  - Values clarification  - Identify activities/goals aligning with values - Discuss barriers and problem solve   Future Appointments  Date Time Provider University  02/21/2021 10:00 AM Milton Ferguson, LCSW [facility]-BH None  02/28/2021 10:30 AM Rubie Maid, MD [facility] None  03/02/2021  9:00 AM [facility] CCC-MM CARE MANAGER 2 [facility]-CCC None  03/13/2021  9:20 AM [name], MD [place] [facility]  04/02/2021  2:45 PM CCAR-MO LAB [facility]-MEDONC None  04/04/2021  9:30 AM [name], MD ASC-ASC None  05/22/2021  2:30 PM OPIC-CT OPIC-CT [facility]  07/05/2021  2:15 PM [facility]-MO LAB [facility]MEDONC None  07/05/2021  2:45 PM Lloyd Huger, MD [facility]AR-MEDONC None  10/29/2021  9:40 AM [name], MD [place]    Milton Ferguson, LCSW

## 2021-02-08 ENCOUNTER — Ambulatory Visit: Payer: Medicaid Other | Admitting: Licensed Clinical Social Worker

## 2021-02-08 ENCOUNTER — Encounter: Payer: Self-pay | Admitting: Oncology

## 2021-02-08 ENCOUNTER — Other Ambulatory Visit (HOSPITAL_COMMUNITY): Payer: Self-pay

## 2021-02-08 ENCOUNTER — Encounter: Payer: Self-pay | Admitting: Family Medicine

## 2021-02-08 NOTE — Telephone Encounter (Signed)
Documentation only.

## 2021-02-14 ENCOUNTER — Other Ambulatory Visit (INDEPENDENT_AMBULATORY_CARE_PROVIDER_SITE_OTHER): Payer: Self-pay | Admitting: Vascular Surgery

## 2021-02-14 DIAGNOSIS — I739 Peripheral vascular disease, unspecified: Secondary | ICD-10-CM | POA: Insufficient documentation

## 2021-02-14 DIAGNOSIS — L819 Disorder of pigmentation, unspecified: Secondary | ICD-10-CM

## 2021-02-14 NOTE — Progress Notes (Deleted)
MRN : [id number]  Monique [name] is a 50 y.o. (Sep 23, 1970) female who presents with chief complaint of No chief complaint on file. Marland Kitchen  History of Present Illness:   The patient is seen for evaluation of painful lower extremities. Patient notes the pain is variable and not always associated with activity.  The pain is somewhat consistent day to day occurring on most days. The patient notes the pain also occurs with standing and routinely seems worse as the day wears on. The pain has been progressive over the past several years. The patient states these symptoms are causing  a profound negative impact on quality of life and daily activities.  The patient denies rest pain or dangling of an extremity off the side of the bed during the night for relief. No open wounds or sores at this time. No history of DVT or phlebitis. No prior interventions or surgeries.  There is a  history of back problems and DJD of the lumbar and sacral spine.    No outpatient medications have been marked as taking for the 02/15/21 encounter (Appointment) with Delana Meyer, Dolores Lory, MD.    Past Medical History:  Diagnosis Date  . Anxiety   . Bursitis    leg pain  . Carpal tunnel syndrome   . Cervical dysplasia    hx LEEP over 18 years ago.   . Chronic lymphocytic leukemia ([place]) 2018   Dr Grayland Ormond.  (in lymph nodes)  . COPD (chronic obstructive pulmonary disease) ([place])   . COVID-19 2021  . Depression   . Eating disorder   . GERD (gastroesophageal reflux disease)   . History of self-harm   . Insomnia   . Obsession   . Tobacco abuse   . Vitamin B12 deficiency (non anemic)     Past Surgical History:  Procedure Laterality Date  . BREAST BIOPSY Right 01/05/2018   US guided biopsy of 2 areas and 1 lymph node, MIXED INFLAMMATION AND GIANT CELL REACTION  . CERVICAL BIOPSY  W/ LOOP ELECTRODE EXCISION    . COLONOSCOPY WITH PROPOFOL N/A 04/21/2019   Procedure: COLONOSCOPY WITH PROPOFOL;  Surgeon: Virgel Manifold, MD;  Location: [facility];  Service: Gastroenterology;  Laterality: N/A;  . OTHER SURGICAL HISTORY     scar tissue removed from vocal cords  . TUBAL LIGATION    . VAGINAL HYSTERECTOMY N/A 01/22/2021   Procedure: HYSTERECTOMY VAGINAL; BILATERAL SALPINGECTOMY;  Surgeon: Rubie Maid, MD;  Location: [facility] ORS;  Service: Gynecology;  Laterality: N/A;  . vocal cord surgery  2005    Social History Social History   Tobacco Use  . Smoking status: Current Every Day Smoker    Packs/day: 1.00    Years: 31.00    Pack years: 31.00    Types: Cigarettes    Start date: 09/25/1986  . Smokeless tobacco: Never Used  Vaping Use  . Vaping Use: Former  Substance Use Topics  . Alcohol use: Not Currently    Alcohol/week: 0.0 standard drinks    Comment: rarely  . Drug use: Not Currently    Types: Marijuana    Family History Family History  Problem Relation Age of Onset  . Cancer Mother        thyroid  . Alcohol abuse Father   . Alcohol abuse Brother   . Depression Brother   . Bipolar disorder Brother   . ADD / ADHD Son   . Breast cancer Neg Hx   No family history of bleeding/clotting disorders,  porphyria or autoimmune disease   No Known Allergies   REVIEW OF SYSTEMS (Negative unless checked)  Constitutional: [] Weight loss  [] Fever  [] Chills Cardiac: [] Chest pain   [] Chest pressure   [] Palpitations   [] Shortness of breath when laying flat   [] Shortness of breath with exertion. Vascular:  [] Pain in legs with walking   [x] Pain in legs at rest  [] History of DVT   [] Phlebitis   [] Swelling in legs   [] Varicose veins   [] Non-healing ulcers Pulmonary:   [] Uses home oxygen   [] Productive cough   [] Hemoptysis   [] Wheeze  [] COPD   [] Asthma Neurologic:  [] Dizziness   [] Seizures   [] History of stroke   [] History of TIA  [] Aphasia   [] Vissual changes   [] Weakness or numbness in arm   [x] Weakness or numbness in leg Musculoskeletal:   [] Joint swelling   [x] Joint pain   [x] Low back  pain Hematologic:  [] Easy bruising  [] Easy bleeding   [] Hypercoagulable state   [] Anemic Gastrointestinal:  [] Diarrhea   [] Vomiting  [x] Gastroesophageal reflux/heartburn   [] Difficulty swallowing. Genitourinary:  [] Chronic kidney disease   [] Difficult urination  [] Frequent urination   [] Blood in urine Skin:  [] Rashes   [] Ulcers  Psychological:  [] History of anxiety   []  History of major depression.  Physical Examination  There were no vitals filed for this visit. There is no height or weight on file to calculate BMI. Gen: WD/WN, NAD Head: [place]/AT, No temporalis wasting.  Ear/Nose/Throat: Hearing grossly intact, nares w/o erythema or drainage, poor dentition Eyes: PER, EOMI, sclera nonicteric.  Neck: Supple, no masses.  No bruit or JVD.  Pulmonary:  Good air movement, clear to auscultation bilaterally, no use of accessory muscles.  Cardiac: RRR, normal S1, S2, no Murmurs. Vascular: *** Vessel Right Left  Radial Palpable Palpable  PT Palpable Palpable  DP Palpable Palpable  Gastrointestinal: soft, non-distended. No guarding/no peritoneal signs.  Musculoskeletal: M/S 5/5 throughout.  No deformity or atrophy.  Neurologic: CN 2-12 intact. Pain and light touch intact in extremities.  Symmetrical.  Speech is fluent. Motor exam as listed above. Psychiatric: Judgment intact, Mood & affect appropriate for pt's clinical situation. Dermatologic: No rashes or ulcers noted.  No changes consistent with cellulitis.  CBC Lab Results  Component Value Date   WBC 9.7 01/23/2021   HGB 12.4 01/23/2021   HCT 36.8 01/23/2021   MCV 94.6 01/23/2021   PLT 143 (L) 01/23/2021    BMET    Component Value Date/Time   NA 137 01/01/2021 1440   NA 138 05/13/2018 1413   K 3.8 01/01/2021 1440   CL 104 01/01/2021 1440   CO2 23 01/01/2021 1440   GLUCOSE 124 (H) 01/01/2021 1440   BUN 16 01/01/2021 1440   BUN 15 05/13/2018 1413   CREATININE 0.88 01/23/2021 0623   CREATININE 0.96 12/06/2020 1646   CALCIUM  8.4 (L) 01/01/2021 1440   GFRNONAA >60 01/23/2021 0623   GFRNONAA 68 12/06/2020 1646   GFRAA 79 12/06/2020 1646   CrCl cannot be calculated (Patient's most recent lab result is older than the maximum 21 days allowed.).  COAG No results found for: INR, PROTIME  Radiology MM 3D SCREEN BREAST BILATERAL  Result Date: 01/16/2021 CLINICAL DATA:  Screening. EXAM: DIGITAL SCREENING BILATERAL MAMMOGRAM WITH TOMOSYNTHESIS AND CAD TECHNIQUE: Bilateral screening digital craniocaudal and mediolateral oblique mammograms were obtained. Bilateral screening digital breast tomosynthesis was performed. The images were evaluated with computer-aided detection. COMPARISON:  Previous exam(s). ACR Breast Density Category b: There are scattered  areas of fibroglandular density. FINDINGS: There are no findings suspicious for malignancy. The images were evaluated with computer-aided detection. IMPRESSION: No mammographic evidence of malignancy. A result letter of this screening mammogram will be mailed directly to the patient. RECOMMENDATION: Screening mammogram in one year. (Code:SM-B-01Y) BI-RADS CATEGORY  1: Negative. Electronically Signed   By: Ammie Ferrier M.D.   On: 01/16/2021 11:45     Assessment/Plan 1. PAD (peripheral artery disease) (HCC) ***  2. Pulmonary emphysema, unspecified emphysema type (HCC) ***  3. Lumbar radiculitis (Right) ***  4. GERD without esophagitis ***    Hortencia Pilar, MD  02/14/2021 5:43 PM

## 2021-02-15 ENCOUNTER — Encounter (INDEPENDENT_AMBULATORY_CARE_PROVIDER_SITE_OTHER): Payer: Self-pay

## 2021-02-15 ENCOUNTER — Encounter (INDEPENDENT_AMBULATORY_CARE_PROVIDER_SITE_OTHER): Payer: Self-pay | Admitting: Vascular Surgery

## 2021-02-15 DIAGNOSIS — J439 Emphysema, unspecified: Secondary | ICD-10-CM

## 2021-02-15 DIAGNOSIS — M5416 Radiculopathy, lumbar region: Secondary | ICD-10-CM

## 2021-02-15 DIAGNOSIS — K219 Gastro-esophageal reflux disease without esophagitis: Secondary | ICD-10-CM

## 2021-02-15 DIAGNOSIS — I739 Peripheral vascular disease, unspecified: Secondary | ICD-10-CM

## 2021-02-21 ENCOUNTER — Ambulatory Visit: Payer: Medicaid Other | Admitting: Licensed Clinical Social Worker

## 2021-02-21 DIAGNOSIS — F4310 Post-traumatic stress disorder, unspecified: Secondary | ICD-10-CM

## 2021-02-21 DIAGNOSIS — F411 Generalized anxiety disorder: Secondary | ICD-10-CM

## 2021-02-21 DIAGNOSIS — F331 Major depressive disorder, recurrent, moderate: Secondary | ICD-10-CM

## 2021-02-21 NOTE — Progress Notes (Signed)
Counselor/Therapist Progress Note  Patient ID: Monique [name], MRN: [id number],    Date: 02/21/2021  Time Spent: 50 minutes   Treatment Type: Psychotherapy  Reported Symptoms: Overall mood stability; anxiety, anxious thoughts, overwhelm, irritability   Mental Status Exam:  Appearance:   Neat and Well Groomed     Behavior:  Appropriate and Sharing  Motor:  Normal  Speech/Language:   Clear and Coherent and Normal Rate  Affect:  Appropriate, Congruent and Full Range  Mood:  euthymic  Thought process:  normal  Thought content:    WNL  Sensory/Perceptual disturbances:    WNL  Orientation:  oriented to person, place, time/date, situation and day of week  Attention:  Good  Concentration:  Good  Memory:  WNL  Fund of knowledge:   Good  Insight:    Good  Judgment:   Good  Impulse Control:  Good   Risk Assessment: Danger to Self:  No Self-injurious Behavior: No Danger to Others: No Duty to Warn:no Physical Aggression / Violence:No  Access to Firearms a concern: No  Gang Involvement:No   Subjective: Patient was engaged and cooperative throughout the session using time effectively to discuss thoughts and feelings. Patient voices continued motivation for treatment and understanding of depression and anxiety issues. Patient is likely to benefit from future treatment because she remains motivated to decrease symptoms and improve functioning.    Interventions: Cognitive Behavioral Therapy Checked in with patient regarding her week. Engaged patient in processing current psychosocial stressors, increased feelings of anxiety and overwhelm due to family interactions and demands. Validated patient feelings and identified points of intervention. Discussed possible points of intervention, including mindfulness, values clarification, cognitive strategies, and parenting techniques. Patient is going to practice regulating emotions before giving consequences to her child between now and the next  session. Provided support through active listening, validation of feelings, and highlighted patient's strengths.   Diagnosis:   ICD-10-CM   1. Major depressive disorder, recurrent episode, moderate (HCC)  F33.1   2. Generalized anxiety disorder  F41.1   3. PTSD (post-traumatic stress disorder)  F43.10    Plan: Patient's goal of treatment is to feel better, to find out ways to get involved in things that will improve her emotional wellbeing.   Treatment Target: Increase realistic balanced thinking  - Explore patient's thoughts, beliefs, automatic thoughts, assumptions  - Identify and replace thinking that leads to depression, anxiety - Process distress and allow for emotional release  - Cognitive reframing  - Questioning and challenging thoughts Treatment Target: Increase coping skills - Attention training - use of mindfulness  - Mindfulness practices  - Deep breathing  Treatment Target: Reducing vulnerability to "emotional mind" - Values clarification   - Address barriers to living with values - social skills, boundaries, assertiveness communication  - Self-care - nutrition, sleep, exercise  - Increase positive events  o Activity planning   Future Appointments  Date Time Provider [place]  02/28/2021 10:30 AM Rubie Maid, MD EWC-EWC None  03/02/2021  9:00 AM [facility] CCC-MM CARE MANAGER 2 THN-CCC None  03/05/2021  1:00 PM [facility] VASC 1 AVVS-IMG None  03/05/2021  1:45 PM Schnier, Dolores Lory, MD AVVS-AVVS None  03/08/2021 10:00 AM Milton Ferguson, LCSW AC-BH None  03/13/2021  9:20 AM [name], MD [place] [facility]  04/02/2021  2:45 PM CCAR-MO LAB [facility]AR-MEDONC None  04/04/2021  9:30 AM [name], MD ASC-ASC None  05/22/2021  2:30 PM OPIC-CT OPIC-CT OPIC-Outpati  07/05/2021  2:15 PM [facility]AR-MO LAB [facility]MEDONC None  07/05/2021  2:45 PM Lloyd Huger, MD [facility]-MEDONC None  10/29/2021  9:40 AM [name], MD [place] [facility]   Milton Ferguson, LCSW

## 2021-02-22 ENCOUNTER — Other Ambulatory Visit: Payer: Medicaid Other

## 2021-02-22 ENCOUNTER — Other Ambulatory Visit: Payer: Self-pay | Admitting: Family Medicine

## 2021-02-22 DIAGNOSIS — I700 Atherosclerosis of aorta: Secondary | ICD-10-CM

## 2021-02-22 MED ORDER — ATORVASTATIN CALCIUM 10 MG PO TABS: 10.0000 mg | ORAL_TABLET | Freq: Every morning | ORAL | 3 refills | Status: DC

## 2021-02-22 NOTE — Patient Outreach (Signed)
Compliant on meds, requesting refills for 02/28/21  Medication BB B L EM BT  Duloxetine 20 mg   1 1  1   Pregabalin 100 mg   1 1  1   Tizanidine 2 mg (VIALS FOR L AND BT, only packs for B)  1  Calcium Carbonate- D (1200-1000)  2 2  2   Ferrous Sulfate 324 mg (PATIENT WANTS TO HOLD)   1    Meloxicam 15 mg   1     Sertraline 100 mg   1.5     Omeprazole 20 mg   1     Atorvastatin 10 mg   1     Loratadine 10 mg   1     Magnesium 500 mg  1     Vitamin B12 500 mcg           Requested refills of Atorvastatin, Meloxicam, and Tizanidine from PCP (Patient told me to ask PCP for refills since specialist denied refills previous)

## 2021-02-28 ENCOUNTER — Encounter: Payer: Self-pay | Admitting: Obstetrics and Gynecology

## 2021-02-28 ENCOUNTER — Ambulatory Visit (INDEPENDENT_AMBULATORY_CARE_PROVIDER_SITE_OTHER): Payer: Medicaid Other | Admitting: Obstetrics and Gynecology

## 2021-02-28 ENCOUNTER — Other Ambulatory Visit: Payer: Self-pay

## 2021-02-28 VITALS — BP 94/61 | HR 77 | Resp 16 | Ht 67.0 in | Wt 172.3 lb

## 2021-02-28 DIAGNOSIS — Z87410 Personal history of cervical dysplasia: Secondary | ICD-10-CM

## 2021-02-28 DIAGNOSIS — Z09 Encounter for follow-up examination after completed treatment for conditions other than malignant neoplasm: Secondary | ICD-10-CM

## 2021-02-28 DIAGNOSIS — Z90710 Acquired absence of both cervix and uterus: Secondary | ICD-10-CM

## 2021-02-28 DIAGNOSIS — N951 Menopausal and female climacteric states: Secondary | ICD-10-CM

## 2021-02-28 DIAGNOSIS — C9110 Chronic lymphocytic leukemia of B-cell type not having achieved remission: Secondary | ICD-10-CM

## 2021-02-28 NOTE — Progress Notes (Signed)
OBSTETRICS/GYNECOLOGY POST-OPERATIVE CLINIC VISIT  Subjective:     Monique [name] is a 50 y.o. female with CLL who presents to the clinic 6 weeks status post vaginal hysterectomy and bilateral salpingectomy for  persistent moderate cervical dysplasia . Eating a regular diet without difficulty. Bowel movements are normal.  The patient is noting some occasional bouts of sharp pain in lower pelvis, lasting for a few seconds and then resolving.   Denies vaginal bleeding.   Reports that her hot flushes have improved with the increasing dose on her hormonal patch.   The following portions of the patient's history were reviewed and updated as appropriate: allergies, current medications, past family history, past medical history, past social history, past surgical history and problem list.  Review of Systems Pertinent items noted in HPI and remainder of comprehensive ROS otherwise negative.    Objective:    BP 94/61   Pulse 77   Resp 16   Ht 5' 7" (1.702 m)   Wt 172 lb 4.8 oz (78.2 kg)   LMP  (LMP Unknown)   BMI 26.99 kg/m  General:  alert and no distress  Abdomen: soft, bowel sounds active, non-tender  Pelvis:   external genitalia normal, rectovaginal septum normal.  Vagina with scant thin white discharge.  Uterus and cervix surgically absent.  Vaginal cuff well-healed.  Adnexae not assessed.    Pathology:   SURGICAL PATHOLOGY 01/22/2021    Final-Edited                   Value:SURGICAL PATHOLOGY CASE: [id number] PATIENT: Monique [name] Surgical Pathology Report     Specimen Submitted: A. Uterus, cervix, bil fallopian tubes  Clinical History: Persistent cervical dysplasia      DIAGNOSIS: A. 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 SEE COMMENT. - ENDOMETRIUM: MILDLY PROLIFERATIVE. - NEGATIVE FOR ATYPIA / EIN AND MALIGNANCY. -  MYOMETRIUM:      - NO SIGNIFICANT PATHOLOGIC ALTERATION. - BILATERAL FALLOPIAN TUBES:      - NO SIGNIFICANT PATHOLOGIC ALTERATION.  Comment: The cervix is involved by the patient's known CLL/SLL.  The cells of interest are positive for CD5, CD20, and CD23.  They are negative for CD10 and cyclinD1. CD3 highlights background T cells.  Ki67 proliferation index appears elevated (approximately 70-80% of the lymphocytes), which may be concerning for more a more aggressive subtype of                          CLL. There is no definitive evidence of large cell/Richter transformation.  Clinical correlation is recommended.  IHC slides were prepared by [facility], [place], [place]. All controls stained appropriately.  This test was developed and its performance characteristics determined by [facility]. It has not been cleared or approved by the [place] Food and Drug Administration. The FDA does not require this test to go through premarket FDA review. This test is used for clinical purposes. It should not be regarded as investigational or for research. This laboratory is certified under the Clinical Laboratory Improvement Amendments (CLIA) as qualified to perform high complexity clinical laboratory testing.   GROSS DESCRIPTION: A. Labeled: Uterus with cervix and bilateral fallopian tubes Received: In formalin Collection time: 9:04 AM on 01/22/2021 Placed into formalin time: 9:26 AM on 01/22/2021 Weight: 70.7 grams Dimensions:      Fundus -7.1  cm (cervix                          to fundus), 3.5 cm (cornu to cornu), 3.1 cm (anterior to posterior)      Cervix - 4.5 x 4.0 x 3.0 (length) cm Serosa: Serosa is red-tan and smooth with diffuse congestion. Cervix: Ectocervical mucosa is yellow-tan with mild focal congestion. Periphery of ectocervical mucosa appears tan-red and mildly congested. The slit-like cervical os measures 1.0 x 0.1 cm.  Anterior cervical margin is inked  blue, posterior cervical margin is inked black Endocervix: Endocervical canal is yellow-tan, ranging from 0.2 to 1.3 cm in diameter, dilating towards the cervical os.  No polyps or lesions are grossly identified. Endometrial cavity:      Dimensions -3.4 x 2.9 cm      Thickness -0.4 cm      Other findings -endometrium is yellow-tan, smooth and mildly glistening.  No polyps or lesions are grossly identified. Myometrium:     Thickness -1.9 cm     Other findings -myometrium is pink-tan, trabeculated, and glandular. No fibrotic nodules or definitive lesions are grossly identified.                          Adnexa:     Fallopian tube A            Measurements -2.0 cm in length x 2.2 cm in diameter (fimbriated portion), 0.9 cm in length x 0.3 cm in diameter (non-fimbriated segment)          Other findings -Received is a portion of fallopian tube with attached fimbriae. A second segment of fallopian tube is also identified, connecting to the previously mentioned segment by a portion of pink-tan soft tissue, displaying evidence of possible previous ligation.  Serosa is red-tan and congested, though otherwise grossly unremarkable.  Sectioning reveals an occluded lumen.      Left fallopian b           Measurements -1.0 cm in length x 0.5 cm in diameter (possible tube), 1.5 x 1.1 x 0.4 cm (entirety of tissue)           Other findings -Received is a single fragment of fimbriated tissue with possible tube.  A 0.5 cm paratubal cyst is identified, containing clear yellow fluid.  Serosa is red-tan.  Specimen is bisected to reveal a possible portion of tube. Other comments: No oth                         er tissue is identified within the container  Block summary: 1 -representative cervix, 12-3 o'clock 2-representative cervix, 3-6 o'clock 3-representative cervix, 6-9 o'clock 4-representative cervix, 9-12 o'clock 5-anterior endomyometrium, full-thickness 6-posterior endometrium,  full-thickness 7-9 fallopian tube A, entirely submitted 10-fallopian tube B, entirely submitted  BAS 01/22/21  Final Diagnosis performed by Betsy Pries, MD.   Electronically signed 01/26/2021 4:30:38PM The electronic signature indicates that the named Attending Pathologist has evaluated the specimen Technical component performed at [facility], [address] [place], [place] [address] Lab: [phone] Dir: Rush Farmer, MD, MMM  Professional component performed at [facility], [facility], [place], [place], [place] [address] Lab: [phone] Dir: Dellia Nims. Rubinas, MD    Assessment:   1. Postop check   2. S/P vaginal hysterectomy   3. CLL (chronic lymphocytic leukemia) ([place])   4. History of cervical dysplasia   5. Menopausal  vasomotor syndrome      Plan:   Has done well post-operatively.  Continue hormonal patch for vasomotor symptoms, currently on 0.6 mg.  Activity restrictions: none. Can resume all activities.  Follow up: as needed. 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  Patient can f/u yearly for surveillance of HRT with GYN.     Rubie Maid, MD [facility]

## 2021-03-01 ENCOUNTER — Other Ambulatory Visit (HOSPITAL_COMMUNITY): Payer: Self-pay

## 2021-03-02 ENCOUNTER — Other Ambulatory Visit: Payer: Self-pay | Admitting: Obstetrics and Gynecology

## 2021-03-02 ENCOUNTER — Other Ambulatory Visit: Payer: Self-pay

## 2021-03-02 NOTE — Patient Instructions (Signed)
Hi Ms. Height, thank you for speaking with me today.  Ms. Matthew was given information about Medicaid Managed Care team care coordination services as a part of their [facility] Medicaid benefit. Alorah Mcree verbally consented to engagement with the [facility] Managed Care team.   For questions related to your [facility] health plan, please call: [phone] or go here:[email or web address]  If you would like to schedule transportation through your [facility] plan, please call the following number at least 2 days in advance of your appointment: [phone].  Call the [facility] at [phone], at any time, 24 hours a day, 7 days a week. If you are in danger or need immediate medical attention call 911.  Ms. Cerone - following are the goals we discussed in your visit today:   Goals Addressed             This Visit's Progress    Cope with Chronic Pain       Timeframe:  Long-Range Goal Priority:  High Start Date:     08/31/20                        Expected End Date:   ongoing             Follow Up Date: 04/01/21   - practice relaxation or meditation daily - use distraction techniques - use relaxation during pain  Will follow up regarding MRI and epidural.   Update 12/05/20:  Patient has not heard back from Dr. Adalberto Cole office regarding approval for MRI and epidural-patient will call office.  Also, patient is out of [place] trouble getting refilled. Pharmacy referral for patient to assist with medications.  Needs DME to assist with burning in her legs-will check with [facility] to see what they will cover. Update 01/05/21:  Patient has received no new updates on MRI/epidural. Patient has her medications and is taking as directed and patient has received DME-massager. Update 02/01/21:  No changes-stable-continues to use massager. Update 03/02/21:  Patient doing well today-no complaints.          Patient verbalizes understanding of  instructions provided today.   The Managed Medicaid care management team will reach out to the patient again over the next 30 days.  The patient has been provided with contact information for the Managed Medicaid care management team and has been advised to call with any health related questions or concerns.   Aida Raider RN, BSN [place]  [facility] [employer] - Managed Medicaid High Risk [phone].    Following is a copy of your plan of care:  Patient Care Plan: Chronic Pain (Adult)     Problem Identified: Chronic Pain Management (Chronic Pain)   Priority: High  Onset Date: 01/05/2021     Long-Range Goal: Chronic Pain Managed   Start Date: 08/31/2020  Expected End Date: 06/02/2021  This Visit's Progress: On track  Recent Progress: Not on track  Priority: Medium  Note:   Long-Range Goal: Chronic Pain Managed    Start Date: 08/31/2020  Expected End Date: 03/07/21  This Visit's Progress: On track  Priority: High  Note:   Current Barriers:  Care Coordination needs related to chronic pain.  Patient needs MRI and epidural injection.  To date, insurance has denied approval.  Patient is out of [place] trouble getting refilled. Update 01/05/21:  Patient has received refill of medications and is taking as directed.  Nurse Case Manager Clinical Goal(s):  Over  the next 30 days, patient will work with provider to address needs related to approval of needed services. Update 09/25/20:  No approval of MRI and epidural to date by insurance.  Continues to take medications as needed. Update 10/25/20: Patient states she will call her Provider to follow up on status of MRI and epidural. Update 12/05/20:  No updates on MRI or epidural -patient has not heard anything from Dr. Adalberto Cole office and states she will call for an update as it has been since November that she has heard anything. Update 01/05/21:  No new updates from patient. Update 03/01/21:  No  changes-patient stable. Over the next 90 days, patient will attend all scheduled medical appointments:    Interventions:  Inter-disciplinary care team collaboration (see longitudinal plan of care) Evaluation of current treatment plan related to chronic pain and patient's adherence to plan as established by provider. Reviewed medications with patient. Discussed plans with patient for ongoing care management follow up and provided patient with direct contact information for care management team Reviewed scheduled/upcoming provider appointments. Pharmacy referral to review medications. Update 01/05/21:  Patient has met with Pharmacist and continues to follow Social Work referral for anxiety/depression, h/o eating disorder and self harm. Update 01/05/21:  Patient has met with SW and therapy sessions scheduled. Update 03/01/21:  Patient sees therapist as scheduled-every 2 weeks. Care Guide referral for DME  resources for patient in which [facility] will help pay for. Update 01/05/21: Patient has received DME.  Patient Goals/Self-Care Activities Over the next 30 days, patient will:  -Attends all scheduled provider appointments Calls provider office for new concerns or questions.  Follow up with Dr. Dossie Arbour regarding MRI/epidural approval.   Follow Up Plan: The Managed Medicaid care management team will reach out to the patient again over the next 30 days.  The patient has been provided with contact information for the Managed Medicaid care management team and has been advised to call with any health related questions or concerns.  RNCM will follow up with provider regarding patient request for DME, chair that provides massage to legs.  Patient feels like this would help her chronic leg pain. Update 10/25/20:  Patient has appointment with PCP 10/26/20 and will follow up with her regarding DME.   Update 01/05/21:  Patient has received DME.           Plan: RNCM will follow up with patient within  30 days. Follow-up:  Patient agrees to Care Plan and Follow-up.     Problem Identified: Chronic Pain Management (Chronic Pain)   Priority: Medium  Onset Date: 01/05/2021     Long-Range Goal: Chronic Pain Managed   Start Date: 08/31/2020  Expected End Date: 06/01/2021  This Visit's Progress: On track  Recent Progress: Not on track  Priority: Medium  Note:   Current Barriers:  Care Coordination needs related to chronic pain.  Patient needs MRI and epidural injection.  To date, insurance has denied approval.  Nurse Case Manager Clinical Goal(s):  Over the next 30 days, patient will work with provider to address needs related to approval of needed services. Over the next 90 days, patient will attend all scheduled medical appointments:   Interventions:  Inter-disciplinary care team collaboration (see longitudinal plan of care) Evaluation of current treatment plan related to chronic pain and patient's adherence to plan as established by provider. Update 02/01/21:  Patient stable, using DME. Update 03/01/21:  Patient remains stable-taking all medications. Reviewed medications with patient. Discussed plans with patient for ongoing  care management follow up and provided patient with direct contact information for care management team Reviewed scheduled/upcoming provider appointments.  Patient Goals/Self-Care Activities Over the next 30 days, patient will:  -Attends all scheduled provider appointments Calls provider office for new concerns or questions  Follow Up Plan: The Managed Medicaid care management team will reach out to the patient again over the next 30 days.  The patient has been provided with contact information for the Managed Medicaid care management team and has been advised to call with any health related questions or concerns.

## 2021-03-02 NOTE — Patient Outreach (Signed)
Medicaid Managed Care   Nurse Care Manager Note  03/02/2021 Name:  Monique [name] MRN:  [id number] DOB:  1971/08/09  Monique [name] is an 50 y.o. year old female who is a primary patient of Monique Sizer, MD.  The [facility] Managed Care Coordination team was consulted for assistance with:    Chronic healthcare management needs.  Monique [name] was given information about Medicaid Managed Care Coordination team services today. Monique [name] agreed to services and verbal consent obtained.  Engaged with patient by telephone for follow up visit in response to provider referral for case management and/or care coordination services.   Assessments/Interventions:  Review of past medical history, allergies, medications, health status, including review of consultants reports, laboratory and other test data, was performed as part of comprehensive evaluation and provision of chronic care management services.  SDOH (Social Determinants of Health) assessments and interventions performed:   Care Plan  No Known Allergies  Medications Reviewed Today     Reviewed by Monique Medicus, RN (Registered Nurse) on 03/02/21 at [phone]  Med List Status: <None>   Medication Order Taking? Sig Documenting Provider Last Dose Status Informant  acetaminophen (TYLENOL) 500 MG tablet [id number] No Take 2 tablets (1,000 mg total) by mouth every 6 (six) hours as needed. Monique Maid, MD Taking Active   ADVAIR DISKUS 250-50 MCG/DOSE AEPB [id number] No Inhale 1 puff into the lungs 2 (two) times daily.  Patient taking differently: Inhale 1 puff into the lungs in the morning.   Monique Sizer, MD Taking Active   Ascorbic Acid (VITAMIN C) 1000 MG tablet [id number] No Take 1,000 mg by mouth daily as needed (immune health). [provider] Taking Active Self  atorvastatin (LIPITOR) 10 MG tablet [id number] No Take 1 tablet (10 mg total) by mouth in the morning. Monique Sizer, MD Taking Active   Calcium Carbonate-Vit  D-Min [facility] CALCIUM 1200) 1200-1000 MG-UNIT CHEW [id number] No Chew 1,200 mg by mouth daily with breakfast. Take in combination with vitamin D and magnesium.  Patient taking differently: Chew 2 tablets by mouth in the morning, at noon, and at bedtime. Take in combination with vitamin D and magnesium.   Monique Pointer, MD Taking Active   Crisaborole [facility]) 2 % OINT [id number] No Apply 1 application topically 2 (two) times daily.  Patient taking differently: Apply 1 application topically 2 (two) times daily. Hands and feet   Monique Bathe, MD Taking Active   docusate sodium (COLACE) 100 MG capsule [id number] No Take 100 mg by mouth 2 (two) times daily. 1-2 daily [provider] Taking Active   DULoxetine (CYMBALTA) 20 MG capsule [id number] No Take 20 mg by mouth in the morning, at noon, and at bedtime. [provider] Taking Active Self  DULoxetine (CYMBALTA) 60 MG capsule [id number] No Take 1 capsule (60 mg total) by mouth daily. Monique Sizer, MD Taking Active Self  estradiol [facility]) 0.06 MG/24HR [id number] No Place 1 patch onto the skin once a week. Monique Maid, MD Taking Active   ferrous sulfate 324 (65 Fe) MG TBEC [id number] No Take 1 tablet (325 mg total) by mouth daily. Monique Sizer, MD Taking Active   gabapentin (NEURONTIN) 100 MG capsule [id number] No Take 100 -200 mg twice a day as needed for severe pain [provider] Taking Active   ibrutinib 420 MG TABS [id number] No Take 1 tablet (420 mg) by mouth daily. Monique [name], Monique [name] Taking Active Self  Med Note (Monique [name], Monique [name] Feb 01, 2021  9:35 AM)    lidocaine-prilocaine (EMLA) cream [id number] No Apply 1 application topically as needed. Monique Sizer, MD Taking Active Self  loratadine (CLARITIN) 10 MG tablet [id number] No Take 1 tablet (10 mg total) by mouth every morning. Monique Sizer, MD Taking Active Self  Magnesium 500 MG CAPS [id number] No Take 500 mg by mouth in the morning.  [provider] Taking Active Self  meloxicam (MOBIC) 15 MG tablet [id number]  Take 1 tablet (15 mg total) by mouth daily.  Patient taking differently: Take 15 mg by mouth in the morning.   Monique Pointer, MD  Expired 11/05/20 2359   omeprazole (PRILOSEC) 20 MG capsule [id number] No Take 1 capsule (20 mg total) by mouth daily.  Patient taking differently: Take 20 mg by mouth in the morning.   Monique Sizer, MD Taking Active   oxyCODONE (OXY IR/ROXICODONE) 5 MG immediate release tablet [id number] No Take 1 tablet (5 mg total) by mouth every 6 (six) hours as needed for moderate pain or severe pain (may take up to 2 tabs every 6 hrs for severe pain). Monique Maid, MD Taking Active   Oxymetazoline HCl (RHOFADE) 1 % CREA [id number] No Apply a thin coat to the entire face every morning. Monique Bathe, MD Taking Active   pregabalin (LYRICA) 100 MG capsule [id number] No Take 100 mg by mouth 3 (three) times daily. [provider] Taking Active Self  Ruxolitinib Phosphate (OPZELURA) 1.5 % CREA [id number] No Apply 1 application topically daily. May use 1-2 times daily to hands and feet as needed. Monique Bathe, MD Taking Active   sertraline (ZOLOFT) 100 MG tablet [id number] No Take 1.5 tablets (150 mg total) by mouth in the morning. Monique Maid, MD Taking Active   tiZANidine (ZANAFLEX) 2 MG tablet [id number] No Take 1 tablet (2 mg total) by mouth every 8 (eight) hours as needed for muscle spasms.  Patient taking differently: Take 2 mg by mouth 3 (three) times daily.   Monique Pointer, MD Taking Expired 11/05/20 2359   Med List Note Monique [name], [place] 02/04/19 1518): Ibrutinib filled at [place] [facility] 05-13-18 09-29-18 Received permission from Monique [name] to stop Imbruvica 7 days for procedure.            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   Special screening for malignant neoplasms, colon    Polyp of sigmoid colon    Hemorrhoids    Trigeminal neuralgia 10/05/2018   Lumbar radiculitis (Right) 09/24/2018   Hypocalcemia 06/03/2018   Vitamin D insufficiency 06/03/2018   Abnormal MRI, lumbar spine (06/02/2020) 06/03/2018   Chronic hip pain (Right) 06/03/2018   Spondylosis without myelopathy or radiculopathy, lumbar region 06/03/2018   DDD (degenerative disc disease), lumbar 06/02/2018   Lumbar facet hypertrophy 06/02/2018   Lumbar facet arthropathy 06/02/2018   Lumbar facet syndrome (Bilateral) (R>L) 06/02/2018   Marijuana use 05/19/2018   Chronic  lower extremity pain (1ry area of Pain) (Bilateral) (R>L) 05/13/2018   Chronic low back pain (2ry area of Pain) (Bilateral) (R>L) w/ sciatica (Bilateral) 05/13/2018   Chronic pain syndrome 05/13/2018   Opiate use 05/13/2018   Disorder of skeletal system 05/13/2018   Pharmacologic therapy 05/13/2018   Problems influencing health status 05/13/2018   Mastalgia 05/05/2018   Breast mass, right 01/07/2018   Face pain 12/11/2017   Tingling 12/11/2017   Thoracic aortic atherosclerosis ([place]) 08/20/2017   Emphysema of lung ([place]) 08/20/2017   Adductor tendinitis 04/23/2017   Trochanteric bursitis of right hip 04/23/2017   GERD without esophagitis 12/11/2016   CLL (chronic lymphocytic leukemia) ([place]) 11/24/2016   Pap smear abnormality of cervix/human papillomavirus (HPV) positive 09/12/2016   Stress incontinence 09/04/2016   B12 deficiency 07/10/2015    Insomnia, persistent 07/10/2015   Anorexia nervosa, restricting type 07/10/2015   Anxiety, generalized 07/10/2015   H/O suicide attempt 07/10/2015   Lymphocytosis 07/10/2015   Obsessive-compulsive disorder 07/10/2015   Tobacco use 07/10/2015   History of cervical dysplasia 07/18/2014    Conditions to be addressed/monitored per PCP order:   chronic healthcare management needs,   Anxiety, Bursitis, Carpal tunnel syndrome, Cervical cancer , Cervical intraepithelial neoplasia I, Chronic lymphocytic leukemia , Depression, Eating disorder, History of self-harm, Insomnia, Obsession, Pap smear abnormality of cervix with LGSIL, Tobacco abuse, and Vitamin B12 deficiency (non anemic).  Care Plan : Chronic Pain (Adult)  Updates made by Monique Medicus, RN since 03/02/2021 12:00 AM     Problem: Chronic Pain Management (Chronic Pain)   Priority: High  Onset Date: 01/05/2021     Long-Range Goal: Chronic Pain Managed   Start Date: 08/31/2020  Expected End Date: 06/02/2021  This Visit's Progress: On track  Recent Progress: Not on track  Priority: Medium  Note:   Long-Range Goal: Chronic Pain Managed    Start Date: 08/31/2020  Expected End Date: 03/07/21  This Visit's Progress: On track  Priority: High  Note:   Current Barriers:  Care Coordination needs related to chronic pain.  Patient needs MRI and epidural injection.  To date, insurance has denied approval.  Patient is out of [place] trouble getting refilled. Update 01/05/21:  Patient has received refill of medications and is taking as directed.  Nurse Case Manager Clinical Goal(s):  Over the next 30 days, patient will work with provider to address needs related to approval of needed services. Update 09/25/20:  No approval of MRI and epidural to date by insurance.  Continues to take medications as needed. Update 10/25/20: Patient states she will call her Provider to follow up on status of MRI and epidural. Update 12/05/20:  No updates on MRI  or epidural -patient has not heard anything from Dr. Adalberto Cole office and states she will call for an update as it has been since November that she has heard anything. Update 01/05/21:  No new updates from patient. Update 03/01/21:  No changes-patient stable. Over the next 90 days, patient will attend all scheduled medical appointments:    Interventions:  Inter-disciplinary care team collaboration (see longitudinal plan of care) Evaluation of current treatment plan related to chronic pain and patient's adherence to plan as established by provider. Reviewed medications with patient. Discussed plans with patient for ongoing care management follow up and provided patient with direct contact information for care management team Reviewed scheduled/upcoming provider appointments. Pharmacy referral to review medications. Update 01/05/21:  Patient has met with Pharmacist and continues to follow Social Work referral for  anxiety/depression, h/o eating disorder and self harm. Update 01/05/21:  Patient has met with SW and therapy sessions scheduled. Update 03/01/21:  Patient sees therapist as scheduled-every 2 weeks. Care Guide referral for DME  resources for patient in which [facility] will help pay for. Update 01/05/21: Patient has received DME.  Patient Goals/Self-Care Activities Over the next 30 days, patient will:  -Attends all scheduled provider appointments Calls provider office for new concerns or questions.  Follow up with Dr. Dossie Arbour regarding MRI/epidural approval.   Follow Up Plan: The Managed Medicaid care management team will reach out to the patient again over the next 30 days.  The patient has been provided with contact information for the Managed Medicaid care management team and has been advised to call with any health related questions or concerns.  RNCM will follow up with provider regarding patient request for DME, chair that provides massage to legs.  Patient feels like this  would help her chronic leg pain. Update 10/25/20:  Patient has appointment with PCP 10/26/20 and will follow up with her regarding DME.   Update 01/05/21:  Patient has received DME.           Plan: RNCM will follow up with patient within 30 days. Follow-up:  Patient agrees to Care Plan and Follow-up.    Problem: Chronic Pain Management (Chronic Pain)   Priority: Medium  Onset Date: 01/05/2021     Long-Range Goal: Chronic Pain Managed   Start Date: 08/31/2020  Expected End Date: 06/01/2021  This Visit's Progress: On track  Recent Progress: Not on track  Priority: Medium  Note:   Current Barriers:  Care Coordination needs related to chronic pain.  Patient needs MRI and epidural injection.  To date, insurance has denied approval.  Nurse Case Manager Clinical Goal(s):  Over the next 30 days, patient will work with provider to address needs related to approval of needed services. Over the next 90 days, patient will attend all scheduled medical appointments:   Interventions:  Inter-disciplinary care team collaboration (see longitudinal plan of care) Evaluation of current treatment plan related to chronic pain and patient's adherence to plan as established by provider. Update 02/01/21:  Patient stable, using DME. Update 03/01/21:  Patient remains stable-taking all medications. Reviewed medications with patient. Discussed plans with patient for ongoing care management follow up and provided patient with direct contact information for care management team Reviewed scheduled/upcoming provider appointments.  Patient Goals/Self-Care Activities Over the next 30 days, patient will:  -Attends all scheduled provider appointments Calls provider office for new concerns or questions  Follow Up Plan: The Managed Medicaid care management team will reach out to the patient again over the next 30 days.  The patient has been provided with contact information for the Managed Medicaid care management team  and has been advised to call with any health related questions or concerns.     Follow Up:  Patient agrees to Care Plan and Follow-up.  Plan: The Managed Medicaid care management team will reach out to the patient again over the next 30 days. and The patient has been provided with contact information for the Managed Medicaid care management team and has been advised to call with any health related questions or concerns.  Date/time of next scheduled RN care management/care coordination outreach: 03/28/21 at 1030.

## 2021-03-05 ENCOUNTER — Ambulatory Visit (INDEPENDENT_AMBULATORY_CARE_PROVIDER_SITE_OTHER): Payer: Medicaid Other

## 2021-03-05 ENCOUNTER — Ambulatory Visit (INDEPENDENT_AMBULATORY_CARE_PROVIDER_SITE_OTHER): Payer: Medicaid Other | Admitting: Vascular Surgery

## 2021-03-05 ENCOUNTER — Other Ambulatory Visit: Payer: Self-pay

## 2021-03-05 ENCOUNTER — Encounter (INDEPENDENT_AMBULATORY_CARE_PROVIDER_SITE_OTHER): Payer: Self-pay | Admitting: Vascular Surgery

## 2021-03-05 VITALS — BP 102/69 | HR 73 | Ht 67.0 in | Wt 171.0 lb

## 2021-03-05 DIAGNOSIS — M7989 Other specified soft tissue disorders: Secondary | ICD-10-CM | POA: Diagnosis not present

## 2021-03-05 DIAGNOSIS — M5136 Other intervertebral disc degeneration, lumbar region: Secondary | ICD-10-CM

## 2021-03-05 DIAGNOSIS — J439 Emphysema, unspecified: Secondary | ICD-10-CM

## 2021-03-05 DIAGNOSIS — R200 Anesthesia of skin: Secondary | ICD-10-CM | POA: Diagnosis not present

## 2021-03-05 DIAGNOSIS — M79669 Pain in unspecified lower leg: Secondary | ICD-10-CM

## 2021-03-05 DIAGNOSIS — I739 Peripheral vascular disease, unspecified: Secondary | ICD-10-CM

## 2021-03-05 DIAGNOSIS — K219 Gastro-esophageal reflux disease without esophagitis: Secondary | ICD-10-CM | POA: Diagnosis not present

## 2021-03-05 DIAGNOSIS — L819 Disorder of pigmentation, unspecified: Secondary | ICD-10-CM | POA: Diagnosis not present

## 2021-03-05 NOTE — Progress Notes (Signed)
MRN : [id number]  Monique [name] is a 50 y.o. (1971-07-09) female who presents with chief complaint of No chief complaint on file. Marland Kitchen  History of Present Illness:   The patient is seen for evaluation of painful lower extremities. Patient notes the pain is variable and not always associated with activity.  She also notes that her legs swell throughout the day.  The pain is somewhat consistent day to day occurring on most days. The patient notes the pain also occurs with standing and routinely seems worse as the day wears on. The pain has been progressive over the past several years. She has documented neuropathy and known lumbar spine pathology.  The patient denies rest pain or dangling of an extremity off the side of the bed during the night for relief. No open wounds or sores at this time. No history of DVT or phlebitis. No prior interventions or surgeries.  There is a  history of back problems and DJD of the lumbar and sacral spine.    ABI's are normal bilaterally  No outpatient medications have been marked as taking for the 03/05/21 encounter (Appointment) with Delana Meyer, Dolores Lory, MD.    Past Medical History:  Diagnosis Date   Anxiety    Bursitis    leg pain   Carpal tunnel syndrome    Cervical dysplasia    hx LEEP over 18 years ago.    Chronic lymphocytic leukemia ([place]) 2018   Dr Grayland Ormond.  (in lymph nodes)   COPD (chronic obstructive pulmonary disease) ([place])    COVID-19 2021   Depression    Eating disorder    GERD (gastroesophageal reflux disease)    History of self-harm    Insomnia    Obsession    Tobacco abuse    Vitamin B12 deficiency (non anemic)     Past Surgical History:  Procedure Laterality Date   BREAST BIOPSY Right 01/05/2018   US guided biopsy of 2 areas and 1 lymph node, MIXED INFLAMMATION AND GIANT CELL REACTION   CERVICAL BIOPSY  W/ LOOP ELECTRODE EXCISION     COLONOSCOPY WITH PROPOFOL N/A 04/21/2019   Procedure: COLONOSCOPY WITH PROPOFOL;   Surgeon: Virgel Manifold, MD;  Location: [facility];  Service: Gastroenterology;  Laterality: N/A;   OTHER SURGICAL HISTORY     scar tissue removed from vocal cords   TUBAL LIGATION     VAGINAL HYSTERECTOMY N/A 01/22/2021   Procedure: HYSTERECTOMY VAGINAL; BILATERAL SALPINGECTOMY;  Surgeon: Rubie Maid, MD;  Location: [facility];  Service: Gynecology;  Laterality: N/A;   vocal cord surgery  2005    Social History Social History   Tobacco Use   Smoking status: Every Day    Packs/day: 1.00    Years: 31.00    Pack years: 31.00    Types: Cigarettes    Start date: 09/25/1986   Smokeless tobacco: Never  Vaping Use   Vaping Use: Former  Substance Use Topics   Alcohol use: Not Currently    Alcohol/week: 0.0 standard drinks    Comment: rarely   Drug use: Not Currently    Types: Marijuana    Family History Family History  Problem Relation Age of Onset   Cancer Mother        thyroid   Alcohol abuse Father    Alcohol abuse Brother    Depression Brother    Bipolar disorder Brother    ADD / ADHD Son    Breast cancer Neg Hx   No family history  of bleeding/clotting disorders, porphyria or autoimmune disease   No Known Allergies   REVIEW OF SYSTEMS (Negative unless checked)  Constitutional: [] Weight loss  [] Fever  [] Chills Cardiac: [] Chest pain   [] Chest pressure   [] Palpitations   [] Shortness of breath when laying flat   [] Shortness of breath with exertion. Vascular:  [x] Pain in legs with walking   [x] Pain in legs at rest  [] History of DVT   [] Phlebitis   [x] Swelling in legs   [] Varicose veins   [] Non-healing ulcers Pulmonary:   [] Uses home oxygen   [] Productive cough   [] Hemoptysis   [] Wheeze  [] COPD   [] Asthma Neurologic:  [] Dizziness   [] Seizures   [] History of stroke   [] History of TIA  [] Aphasia   [] Vissual changes   [] Weakness or numbness in arm   [] Weakness or numbness in leg Musculoskeletal:   [] Joint swelling   [x] Joint pain   [x] Low back pain Hematologic:  [] Easy  bruising  [] Easy bleeding   [] Hypercoagulable state   [] Anemic Gastrointestinal:  [] Diarrhea   [] Vomiting  [x] Gastroesophageal reflux/heartburn   [] Difficulty swallowing. Genitourinary:  [] Chronic kidney disease   [] Difficult urination  [] Frequent urination   [] Blood in urine Skin:  [] Rashes   [] Ulcers  Psychological:  [] History of anxiety   []  History of major depression.  Physical Examination  There were no vitals filed for this visit. There is no height or weight on file to calculate BMI. Gen: WD/WN, NAD Head: [place]/AT, No temporalis wasting.  Ear/Nose/Throat: Hearing grossly intact, nares w/o erythema or drainage, poor dentition Eyes: PER, EOMI, sclera nonicteric.  Neck: Supple, no masses.  No bruit or JVD.  Pulmonary:  Good air movement, clear to auscultation bilaterally, no use of accessory muscles.  Cardiac: RRR, normal S1, S2, no Murmurs. Vascular:  trace edema Vessel Right Left  Radial Palpable Palpable  PT Not Palpable Not Palpable  DP Trace Palpable Trace Palpable  Gastrointestinal: soft, non-distended. No guarding/no peritoneal signs.  Musculoskeletal: M/S 5/5 throughout.  No deformity or atrophy.  Neurologic: CN 2-12 intact. Pain and light touch intact in extremities.  Symmetrical.  Speech is fluent. Motor exam as listed above. Psychiatric: Judgment intact, Mood & affect appropriate for pt's clinical situation. Dermatologic: No rashes or ulcers noted.  No changes consistent with cellulitis.   CBC Lab Results  Component Value Date   WBC 9.7 01/23/2021   HGB 12.4 01/23/2021   HCT 36.8 01/23/2021   MCV 94.6 01/23/2021   PLT 143 (L) 01/23/2021    BMET    Component Value Date/Time   NA 137 01/01/2021 1440   NA 138 05/13/2018 1413   K 3.8 01/01/2021 1440   CL 104 01/01/2021 1440   CO2 23 01/01/2021 1440   GLUCOSE 124 (H) 01/01/2021 1440   BUN 16 01/01/2021 1440   BUN 15 05/13/2018 1413   CREATININE 0.88 01/23/2021 0623   CREATININE 0.96 12/06/2020 1646    CALCIUM 8.4 (L) 01/01/2021 1440   GFRNONAA >60 01/23/2021 0623   GFRNONAA 68 12/06/2020 1646   GFRAA 79 12/06/2020 1646   CrCl cannot be calculated (Patient's most recent lab result is older than the maximum 21 days allowed.).  COAG No results found for: INR, PROTIME  Radiology No results found.   Assessment/Plan 1. Pain and swelling of lower leg, unspecified laterality Recommend:  I do not find evidence of Vascular pathology that would explain the patient's symptoms  The patient has atypical pain symptoms for vascular disease  I do not find evidence of Vascular  pathology that would explain the patient's symptoms and I suspect the patient is c/o pseudoclaudication.  Patient should have an evaluation of his LS spine which I defer to the primary service.  Noninvasive studies including ABI's of the legs do not identify vascular problems  The patient should continue walking and begin a more formal exercise program. The patient should continue his antiplatelet therapy and aggressive treatment of the lipid abnormalities. The patient should begin wearing graduated compression socks 10-15 mmHg strength to control her mild edema.  Patient will follow-up with me on a PRN basis  Further work-up of her lower extremity pain is deferred to the primary service      2. PAD (peripheral artery disease) (HCC) Recommend:  I do not find evidence of Vascular pathology that would explain the patient's symptoms  The patient has atypical pain symptoms for vascular disease  I do not find evidence of Vascular pathology that would explain the patient's symptoms and I suspect the patient is c/o pseudoclaudication.  Patient should have an evaluation of his LS spine which I defer to the primary service.  Noninvasive studies including ABI's of the legs do not identify vascular problems  The patient should continue walking and begin a more formal exercise program. The patient should continue his  antiplatelet therapy and aggressive treatment of the lipid abnormalities. The patient should begin wearing graduated compression socks 10-15 mmHg strength to control her mild edema.  Patient will follow-up with me on a PRN basis  Further work-up of her lower extremity pain is deferred to the primary service   3. DDD (degenerative disc disease), lumbar Recommend:  I do not find evidence of Vascular pathology that would explain the patient's symptoms  The patient has atypical pain symptoms for vascular disease  I do not find evidence of Vascular pathology that would explain the patient's symptoms and I suspect the patient is c/o pseudoclaudication.  Patient should have an evaluation of his LS spine which I defer to the primary service.  Noninvasive studies including ABI's of the legs do not identify vascular problems  The patient should continue walking and begin a more formal exercise program. The patient should continue his antiplatelet therapy and aggressive treatment of the lipid abnormalities. The patient should begin wearing graduated compression socks 10-15 mmHg strength to control her mild edema.  Patient will follow-up with me on a PRN basis  Further work-up of her lower extremity pain is deferred to the primary service   4. GERD without esophagitis Continue PPI as already ordered, this medication has been reviewed and there are no changes at this time.  Avoidence of caffeine and alcohol  Moderate elevation of the head of the bed    5. Pulmonary emphysema, unspecified emphysema type ([place]) Continue pulmonary medications and aerosols as already ordered, these medications have been reviewed and there are no changes at this time.     Hortencia Pilar, MD  03/05/2021 1:20 PM

## 2021-03-07 ENCOUNTER — Other Ambulatory Visit (HOSPITAL_COMMUNITY): Payer: Self-pay

## 2021-03-08 ENCOUNTER — Ambulatory Visit: Payer: Medicaid Other | Admitting: Licensed Clinical Social Worker

## 2021-03-08 DIAGNOSIS — F331 Major depressive disorder, recurrent, moderate: Secondary | ICD-10-CM

## 2021-03-08 DIAGNOSIS — F411 Generalized anxiety disorder: Secondary | ICD-10-CM

## 2021-03-08 DIAGNOSIS — F4310 Post-traumatic stress disorder, unspecified: Secondary | ICD-10-CM

## 2021-03-08 NOTE — Progress Notes (Signed)
Counselor/Therapist Progress Note  Patient ID: Monique [name], MRN: [id number],    Date: 03/08/2021  Time Spent: 50 minutes   Treatment Type: Psychotherapy  Reported Symptoms:  low mood  Mental Status Exam:  Appearance:   Well Groomed     Behavior:  Appropriate and Sharing  Motor:  Normal  Speech/Language:   Clear and Coherent  Affect:  Appropriate, Congruent, and Full Range  Mood:  normal  Thought process:  goal directed  Thought content:    WNL  Sensory/Perceptual disturbances:    WNL  Orientation:  oriented to person, place, time/date, situation, and day of week  Attention:  Good  Concentration:  Good  Memory:  WNL  Fund of knowledge:   Good  Insight:    Good  Judgment:   Good  Impulse Control:  Good   Risk Assessment: Danger to Self:  No Self-injurious Behavior: No Danger to Others: No Duty to Warn:no Physical Aggression / Violence:No  Access to Firearms a concern: No  Gang Involvement:No   Subjective: Patient was engaged and cooperative throughout the session using time effectively to discuss thoughts and feelings. Patient voices continued motivation for treatment and understanding of depression and anxiety issues. Patient is likely to benefit from future treatment because she remains motivated to decrease depressive symptoms and anxiety and reports benefit of regular sessions in addressing these symptoms.     Interventions: Cognitive Behavioral Therapy Checked in with patient regarding her week. Reviewed previous session regarding parenting and having clear rules, boundaries and consequences. Engaged patient in processing distress related to family of origin, validated patient's feelings and identifying origins of these feelings within the relationship and reframing unhelpful thoughts leading to increase sadness/hurt feelings. Praised patient for having open communication with family members. And encouraged patient to write out a list of boundaries to set with her  children. Provided support through active listening, validation of feelings, and highlighted patient's strengths.   Diagnosis:   ICD-10-CM   1. Major depressive disorder, recurrent episode, moderate (HCC)  F33.1     2. Generalized anxiety disorder  F41.1     3. PTSD (post-traumatic stress disorder)  F43.10      Plan: Patient's goal of treatment is to feel better, to find out ways to get involved in things that will improve her emotional wellbeing.     Treatment Target: Increase realistic balanced thinking Explore patient's thoughts, beliefs, automatic thoughts, assumptions Identify and replace thinking that leads to depression, anxiety Process distress and allow for emotional release Cognitive reframing Questioning and challenging thoughts Treatment Target: Increase coping skills Attention training - use of mindfulness Mindfulness practices Deep breathing Treatment Target: Reducing vulnerability to "emotional mind" Values clarification   Address barriers to living with values - social skills, boundaries, assertiveness communication Self-care - nutrition, sleep, exercise Increase positive events Activity planning   Future Appointments  Date Time Provider [place]  03/13/2021  9:20 AM [name], MD [place] [facility]  03/22/2021 10:00 AM Milton Ferguson, LCSW [facility] None  03/28/2021 10:30 AM [facility] CARE MANAGER 2 [facility] None  04/02/2021  2:45 PM [facility] None  04/04/2021  9:30 AM [name], MD [facility] None  05/22/2021  2:30 PM [facility]  07/05/2021  2:15 PM [facility] None  07/05/2021  2:45 PM Lloyd Huger, MD [facility] None  10/29/2021  9:40 AM [name], MD [place], LCSW

## 2021-03-12 NOTE — Progress Notes (Signed)
Name: Monique [name]   MRN: [id number]    DOB: 09/07/1971   Date:03/13/2021       Progress Note  Subjective  Chief Complaint  Follow Up  HPI   Knot on the left upper posterior thigh: she states she noticed a spot / know that was not tender, she felt by palpating the area, she states she noticed a long time ago and was given reassurance by [name]. It is not changing in size, no redness and no pain, seems to move around under the skin. Unchanged and no longer bothering her   Excoriation: she developed itchy bumps that she scratches and forms a small ulceration on back, left arms and shoulder, now on scalp and right abdomen and arm. She is allergic to insect bites and is avoiding going outside. Symptoms started 3 days ago and is spreading to the right side, rash is not painful but itchy.   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 last visit was in October 2021 , she is on oral medication Imbruvica daily. She has an appointment coming up soon    Low back pain and radiculitis/neuropahty : she is on Lyrica, duloxetine, Tizanidine and Meloxicam  per pain management - Dr. Wynona Canes  and neurologist. Pain is constant and stable. . She states tingling, numbness, pain comes and goes on both legs and arms, and sometimes stumbles when walking. She states symptoms have been stable    Emphysema: she has been smoking, she is stressed out and unable to quit,She has daily cough,  no wheezing but has intermittent sob -usually with activity. She is on Advair once a day and albuterol    Atherosclerosis: she has been taking Atorvastatin and no side effects. Last LDL was 80   Carpal Tunnel: had NCS done by Dr. 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 Marcelline Mates ) and also on Duloxetine ( for pain) , she states symptoms have improved, seeing a therapist and seems to be helping her process her issues also. 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/09/2020   Bilateral leg pain 11/03/2019   Iron deficiency anemia 10/15/2019   Bilateral arm pain 08/05/2019   Bilateral hand numbness 08/05/2019   Weakness of both hands 08/05/2019   Chronic musculoskeletal pain 07/19/2019   Chronic neuropathic pain 07/19/2019   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   Special screening for malignant neoplasms, colon    Polyp of sigmoid colon    Hemorrhoids    Trigeminal neuralgia 10/05/2018   Lumbar radiculitis (Right) 09/24/2018   Hypocalcemia 06/03/2018   Vitamin D insufficiency 06/03/2018   Abnormal MRI, lumbar spine (06/02/2020) 06/03/2018   Chronic hip pain (Right) 06/03/2018   Spondylosis without myelopathy or radiculopathy, lumbar region 06/03/2018  DDD (degenerative disc disease), lumbar 06/02/2018   Lumbar facet hypertrophy 06/02/2018   Lumbar facet arthropathy 06/02/2018   Lumbar facet syndrome (Bilateral) (R>L) 06/02/2018   Marijuana use 05/19/2018   Chronic lower extremity pain (1ry area of Pain) (Bilateral) (R>L) 05/13/2018   Chronic low back pain (2ry area of Pain) (Bilateral) (R>L) w/ sciatica (Bilateral) 05/13/2018   Chronic pain syndrome 05/13/2018   Disorder of skeletal system 05/13/2018   Pharmacologic therapy 05/13/2018   Problems influencing health status 05/13/2018   Mastalgia  05/05/2018   Breast mass, right 01/07/2018   Face pain 12/11/2017   Tingling 12/11/2017   Thoracic aortic atherosclerosis ([place]) 08/20/2017   Emphysema of lung ([place]) 08/20/2017   Adductor tendinitis 04/23/2017   Trochanteric bursitis of right hip 04/23/2017   GERD without esophagitis 12/11/2016   CLL (chronic lymphocytic leukemia) ([place]) 11/24/2016   Pap smear abnormality of cervix/human papillomavirus (HPV) positive 09/12/2016   Stress incontinence 09/04/2016   B12 deficiency 07/10/2015   Insomnia, persistent 07/10/2015   Anorexia nervosa, restricting type 07/10/2015   Anxiety, generalized 07/10/2015   H/O suicide attempt 07/10/2015   Lymphocytosis 07/10/2015   Obsessive-compulsive disorder 07/10/2015   Tobacco use 07/10/2015   History of cervical dysplasia 07/18/2014    Past Surgical History:  Procedure Laterality Date   BREAST BIOPSY Right 01/05/2018   US guided biopsy of 2 areas and 1 lymph node, MIXED INFLAMMATION AND GIANT CELL REACTION   CERVICAL BIOPSY  W/ LOOP ELECTRODE EXCISION     COLONOSCOPY WITH PROPOFOL N/A 04/21/2019   Procedure: COLONOSCOPY WITH PROPOFOL;  Surgeon: Virgel Manifold, MD;  Location: [facility] ENDOSCOPY;  Service: Gastroenterology;  Laterality: N/A;   OTHER SURGICAL HISTORY     scar tissue removed from vocal cords   TUBAL LIGATION     VAGINAL HYSTERECTOMY N/A 01/22/2021   Procedure: HYSTERECTOMY VAGINAL; BILATERAL SALPINGECTOMY;  Surgeon: Rubie Maid, MD;  Location: [facility] ORS;  Service: Gynecology;  Laterality: N/A;   vocal cord surgery  2005    Family History  Problem Relation Age of Onset   Cancer Mother        thyroid   Alcohol abuse Father    Alcohol abuse Brother    Depression Brother    Bipolar disorder Brother    ADD / ADHD Son    Breast cancer Neg Hx     Social History   Tobacco Use   Smoking status: Every Day    Packs/day: 1.00    Years: 31.00    Pack years: 31.00    Types: Cigarettes    Start date: 09/25/1986   Smokeless  tobacco: Never  Substance Use Topics   Alcohol use: Not Currently    Alcohol/week: 0.0 standard drinks    Comment: rarely     Current Outpatient Medications:    ADVAIR DISKUS 250-50 MCG/DOSE AEPB, Inhale 1 puff into the lungs 2 (two) times daily. (Patient taking differently: Inhale 1 puff into the lungs in the morning.), Disp: 180 each, Rfl: 1   Ascorbic Acid (VITAMIN C) 1000 MG tablet, Take 1,000 mg by mouth daily as needed (immune health)., Disp: , Rfl:    atorvastatin (LIPITOR) 10 MG tablet, Take 1 tablet (10 mg total) by mouth in the morning., Disp: 90 tablet, Rfl: 3   Calcium Carbonate-Vit D-Min (GNP CALCIUM 1200) 1200-1000 MG-UNIT CHEW, Chew 1,200 mg by mouth daily with breakfast. Take in combination with vitamin D and magnesium. (Patient taking differently: Chew 2 tablets by mouth in the morning, at noon, and  at bedtime. Take in combination with vitamin D and magnesium.), Disp: 90 tablet, Rfl: 3   Crisaborole (EUCRISA) 2 % OINT, Apply 1 application topically 2 (two) times daily. (Patient taking differently: Apply 1 application topically 2 (two) times daily. Hands and feet), Disp: 60 g, Rfl: 3   DULoxetine (CYMBALTA) 20 MG capsule, Take 20 mg by mouth in the morning, at noon, and at bedtime., Disp: , Rfl:    DULoxetine (CYMBALTA) 60 MG capsule, Take 1 capsule (60 mg total) by mouth daily., Disp: 90 capsule, Rfl: 0   estradiol (CLIMARA) 0.06 MG/24HR, Place 1 patch onto the skin once a week., Disp: 4 patch, Rfl: 12   ferrous sulfate 324 (65 Fe) MG TBEC, Take 1 tablet (325 mg total) by mouth daily., Disp: 90 tablet, Rfl: 1   gabapentin (NEURONTIN) 100 MG capsule, Take 100 -200 mg twice a day as needed for severe pain, Disp: , Rfl:    ibrutinib 420 MG TABS, Take 1 tablet (420 mg) by mouth daily., Disp: 28 tablet, Rfl: 5   loratadine (CLARITIN) 10 MG tablet, Take 1 tablet (10 mg total) by mouth every morning., Disp: 90 tablet, Rfl: 3   Magnesium 500 MG CAPS, Take 500 mg by mouth in the  morning., Disp: , Rfl:    omeprazole (PRILOSEC) 20 MG capsule, Take 1 capsule (20 mg total) by mouth daily. (Patient taking differently: Take 20 mg by mouth in the morning.), Disp: 90 capsule, Rfl: 1   Oxymetazoline HCl (RHOFADE) 1 % CREA, Apply a thin coat to the entire face every morning., Disp: 30 g, Rfl: 3   pregabalin (LYRICA) 100 MG capsule, Take 100 mg by mouth 3 (three) times daily., Disp: , Rfl:    Ruxolitinib Phosphate (OPZELURA) 1.5 % CREA, Apply 1 application topically daily. May use 1-2 times daily to hands and feet as needed., Disp: 60 g, Rfl: 2   sertraline (ZOLOFT) 100 MG tablet, Take 1.5 tablets (150 mg total) by mouth in the morning., Disp: 45 tablet, Rfl: 0   vitamin B-12 (CYANOCOBALAMIN) 500 MCG tablet, Take 250 mcg by mouth every morning., Disp: , Rfl:    acetaminophen (TYLENOL) 500 MG tablet, Take 2 tablets (1,000 mg total) by mouth every 6 (six) hours as needed. (Patient not taking: Reported on 03/13/2021), Disp: 60 tablet, Rfl: 0   docusate sodium (COLACE) 100 MG capsule, Take 100 mg by mouth 2 (two) times daily. 1-2 daily (Patient not taking: Reported on 03/13/2021), Disp: , Rfl:    lidocaine-prilocaine (EMLA) cream, Apply 1 application topically as needed. (Patient not taking: Reported on 03/13/2021), Disp: 30 g, Rfl: 1   meloxicam (MOBIC) 15 MG tablet, Take 1 tablet (15 mg total) by mouth daily. (Patient taking differently: Take 15 mg by mouth in the morning.), Disp: 30 tablet, Rfl: 2   oxyCODONE (OXY IR/ROXICODONE) 5 MG immediate release tablet, Take 1 tablet (5 mg total) by mouth every 6 (six) hours as needed for moderate pain or severe pain (may take up to 2 tabs every 6 hrs for severe pain). (Patient not taking: Reported on 03/13/2021), Disp: 10 tablet, Rfl: 0   tiZANidine (ZANAFLEX) 2 MG tablet, Take 1 tablet (2 mg total) by mouth every 8 (eight) hours as needed for muscle spasms. (Patient taking differently: Take 2 mg by mouth 3 (three) times daily.), Disp: 90 tablet, Rfl:  2  No Known Allergies  I personally reviewed active problem list, medication list, allergies, family history, social history, health maintenance with the patient/caregiver today.  ROS  Constitutional: Negative for fever or weight change.  Respiratory: Negative for cough and shortness of breath.   Cardiovascular: Negative for chest pain or palpitations.  Gastrointestinal: Negative for abdominal pain, no bowel changes.  Musculoskeletal: Negative for gait problem or joint swelling.  Skin: positive for rash.  Neurological: Negative for dizziness or headache.  No other specific complaints in a complete review of systems (except as listed in HPI above).   Objective  Vitals:   03/13/21 0919  BP: 100/60  Pulse: 83  Resp: 16  Temp: 98.1 F (36.7 C)  TempSrc: Oral  SpO2: 97%  Weight: 175 lb (79.4 kg)  Height: $Remove'5\' 7"'HHJMfCM$  (1.702 m)    Body mass index is 27.41 kg/m.  Physical Exam  Constitutional: Patient appears well-developed and well-nourished.  No distress.  HEENT: head atraumatic, normocephalic, pupils equal and reactive to light, neck supple Cardiovascular: Normal rate, regular rhythm and normal heart sounds.  No murmur heard. No BLE edema. Pulmonary/Chest: Effort normal and breath sounds normal. No respiratory distress. Abdominal: Soft.  There is no tenderness. Skin: some excoriation, lesions that seem to be from insect bites.  Psychiatric: Patient has a normal mood and affect. behavior is normal. Judgment and thought content normal.  Recent Results (from the past 2160 hour(s))  CBC     Status: Abnormal   Collection Time: 12/22/20  9:50 AM  Result Value Ref Range   WBC 13.3 (H) 4.0 - 10.5 K/uL   RBC 4.52 3.87 - 5.11 MIL/uL   Hemoglobin 14.5 12.0 - 15.0 g/dL   HCT 43.9 36.0 - 46.0 %   MCV 97.1 80.0 - 100.0 fL   MCH 32.1 26.0 - 34.0 pg   MCHC 33.0 30.0 - 36.0 g/dL   RDW 14.6 11.5 - 15.5 %   Platelets 158 150 - 400 K/uL   nRBC 0.0 0.0 - 0.2 %    Comment: Performed at  [facility], [address] [place], [place] [address]  Type and screen     Status: None   Collection Time: 12/22/20  9:50 AM  Result Value Ref Range   ABO/RH(D) A POS    Antibody Screen NEG    Sample Expiration 01/05/2021,2359    Extend sample reason      NO TRANSFUSIONS OR PREGNANCY IN THE PAST 3 MONTHS Performed at [facility], [address] [place], [place] [address]   Comprehensive metabolic panel     Status: Abnormal   Collection Time: 12/22/20  9:50 AM  Result Value Ref Range   Sodium 142 135 - 145 mmol/L   Potassium 4.2 3.5 - 5.1 mmol/L   Chloride 109 98 - 111 mmol/L   CO2 25 22 - 32 mmol/L   Glucose, Bld 83 70 - 99 mg/dL    Comment: Glucose reference range applies only to samples taken after fasting for at least 8 hours.   BUN 17 6 - 20 mg/dL   Creatinine, Ser 0.89 0.44 - 1.00 mg/dL   Calcium 8.8 (L) 8.9 - 10.3 mg/dL   Total Protein 6.3 (L) 6.5 - 8.1 g/dL   Albumin 3.8 3.5 - 5.0 g/dL   AST 15 15 - 41 U/L   ALT 15 0 - 44 U/L   Alkaline Phosphatase 59 38 - 126 U/L   Total Bilirubin 0.5 0.3 - 1.2 mg/dL   GFR, Estimated >60 >60 mL/min    Comment: (NOTE) Calculated using the CKD-EPI Creatinine Equation (2021)    Anion gap 8 5 -  15    Comment: Performed at [facility], [place], [place] [address]  Rapid HIV screen (HIV 1/2 Ab+Ag)     Status: None   Collection Time: 12/22/20  9:50 AM  Result Value Ref Range   HIV-1 P24 Antigen - HIV24 NON REACTIVE NON REACTIVE    Comment: (NOTE) Detection of p24 may be inhibited by biotin in the sample, causing false negative results in acute infection.    HIV 1/2 Antibodies NON REACTIVE NON REACTIVE   Interpretation (HIV Ag Ab)      A non reactive test result means that HIV 1 or HIV 2 antibodies and HIV 1 p24 antigen were not detected in the specimen.    Comment: Performed at [facility], [place], [place] [address]  SARS CORONAVIRUS 2 (TAT 6-24 HRS)  Nasopharyngeal Nasopharyngeal Swab     Status: None   Collection Time: 12/22/20  9:54 AM   Specimen: Nasopharyngeal Swab  Result Value Ref Range   SARS Coronavirus 2 NEGATIVE NEGATIVE    Comment: (NOTE) SARS-CoV-2 target nucleic acids are NOT DETECTED.  The SARS-CoV-2 RNA is generally detectable in upper and lower respiratory specimens during the acute phase of infection. Negative results do not preclude SARS-CoV-2 infection, do not rule out co-infections with other pathogens, and should not be used as the sole basis for treatment or other patient management decisions. Negative results must be combined with clinical observations, patient history, and epidemiological information. The expected result is Negative.  Fact Sheet for Patients: SugarRoll.be  Fact Sheet for Healthcare Providers: https://www.woods-mathews.com/  This test is not yet approved or cleared by the [place] FDA and  has been authorized for detection and/or diagnosis of SARS-CoV-2 by FDA under an Emergency Use Authorization (EUA). This EUA will remain  in effect (meaning this test can be used) for the duration of the COVID-19 declaration under Se ction 564(b)(1) of the Act, 21 U.S.C. section 360bbb-3(b)(1), unless the authorization is terminated or revoked sooner.  Performed at [facility], [place] [address], [place], [place] [address]   CBC with Differential     Status: Abnormal   Collection Time: 01/01/21  2:40 PM  Result Value Ref Range   WBC 12.1 (H) 4.0 - 10.5 K/uL   RBC 4.66 3.87 - 5.11 MIL/uL   Hemoglobin 15.2 (H) 12.0 - 15.0 g/dL   HCT 44.6 36.0 - 46.0 %   MCV 95.7 80.0 - 100.0 fL   MCH 32.6 26.0 - 34.0 pg   MCHC 34.1 30.0 - 36.0 g/dL   RDW 13.7 11.5 - 15.5 %   Platelets 150 150 - 400 K/uL   nRBC 0.0 0.0 - 0.2 %   Neutrophils Relative % 46 %   Neutro Abs 5.6 1.7 - 7.7 K/uL   Lymphocytes Relative 45 %   Lymphs Abs 5.4 (H) 0.7 - 4.0 K/uL    Monocytes Relative 7 %   Monocytes Absolute 0.8 0.1 - 1.0 K/uL   Eosinophils Relative 1 %   Eosinophils Absolute 0.1 0.0 - 0.5 K/uL   Basophils Relative 0 %   Basophils Absolute 0.1 0.0 - 0.1 K/uL   Immature Granulocytes 1 %   Abs Immature Granulocytes 0.07 0.00 - 0.07 K/uL    Comment: Performed at [facility], [address] [place], [place] [address]  Comprehensive metabolic panel     Status: Abnormal   Collection Time: 01/01/21  2:40 PM  Result Value Ref Range  Sodium 137 135 - 145 mmol/L   Potassium 3.8 3.5 - 5.1 mmol/L   Chloride 104 98 - 111 mmol/L   CO2 23 22 - 32 mmol/L   Glucose, Bld 124 (H) 70 - 99 mg/dL    Comment: Glucose reference range applies only to samples taken after fasting for at least 8 hours.   BUN 16 6 - 20 mg/dL   Creatinine, Ser 0.88 0.44 - 1.00 mg/dL   Calcium 8.4 (L) 8.9 - 10.3 mg/dL   Total Protein 6.3 (L) 6.5 - 8.1 g/dL   Albumin 3.9 3.5 - 5.0 g/dL   AST 18 15 - 41 U/L   ALT 13 0 - 44 U/L   Alkaline Phosphatase 59 38 - 126 U/L   Total Bilirubin 0.7 0.3 - 1.2 mg/dL   GFR, Estimated >60 >60 mL/min    Comment: (NOTE) Calculated using the CKD-EPI Creatinine Equation (2021)    Anion gap 10 5 - 15    Comment: Performed at [facility], [address] [place], [place] [address]  Iron and TIBC     Status: None   Collection Time: 01/01/21  2:41 PM  Result Value Ref Range   Iron 67 28 - 170 ug/dL   TIBC 370 250 - 450 ug/dL   Saturation Ratios 18 10.4 - 31.8 %   UIBC 303 ug/dL    Comment: Performed at [facility], [address] [place], [place] [address]  Ferritin     Status: Abnormal   Collection Time: 01/01/21  2:41 PM  Result Value Ref Range   Ferritin 10 (L) 11 - 307 ng/mL    Comment: Performed at [facility], [place], [place] [address]  SARS CORONAVIRUS 2 (TAT 6-24 HRS) Nasopharyngeal Nasopharyngeal Swab     Status: None   Collection Time: 01/18/21  8:57 AM   Specimen: Nasopharyngeal  Swab  Result Value Ref Range   SARS Coronavirus 2 NEGATIVE NEGATIVE    Comment: (NOTE) SARS-CoV-2 target nucleic acids are NOT DETECTED.  The SARS-CoV-2 RNA is generally detectable in upper and lower respiratory specimens during the acute phase of infection. Negative results do not preclude SARS-CoV-2 infection, do not rule out co-infections with other pathogens, and should not be used as the sole basis for treatment or other patient management decisions. Negative results must be combined with clinical observations, patient history, and epidemiological information. The expected result is Negative.  Fact Sheet for Patients: SugarRoll.be  Fact Sheet for Healthcare Providers: https://www.woods-mathews.com/  This test is not yet approved or cleared by the [place] FDA and  has been authorized for detection and/or diagnosis of SARS-CoV-2 by FDA under an Emergency Use Authorization (EUA). This EUA will remain  in effect (meaning this test can be used) for the duration of the COVID-19 declaration under Se ction 564(b)(1) of the Act, 21 U.S.C. section 360bbb-3(b)(1), unless the authorization is terminated or revoked sooner.  Performed at [facility], [place] [address], [place], [place] [address]   Type and screen [place]     Status: None   Collection Time: 01/18/21  9:08 AM  Result Value Ref Range   ABO/RH(D) A POS    Antibody Screen NEG    Sample Expiration 02/01/2021,2359    Extend sample reason      NO TRANSFUSIONS OR PREGNANCY IN THE PAST 3 MONTHS Performed at [facility], [address] [place], [place] [address]   Pregnancy, urine POC  Status: None   Collection Time: 01/22/21  6:16 AM  Result Value Ref Range   Preg Test, Ur NEGATIVE NEGATIVE    Comment:        THE SENSITIVITY OF THIS METHODOLOGY IS >24 mIU/mL   Pregnancy, urine     Status: None   Collection Time: 01/22/21  6:20 AM   Result Value Ref Range   Preg Test, Ur NEGATIVE NEGATIVE    Comment: Performed at [facility], [address], [place], [place] [address]  Urine Drug Screen, Qualitative ([facility] only)     Status: Abnormal   Collection Time: 01/22/21  6:20 AM  Result Value Ref Range   Tricyclic, Ur Screen NONE DETECTED NONE DETECTED   Amphetamines, Ur Screen NONE DETECTED NONE DETECTED   MDMA (Ecstasy)Ur Screen NONE DETECTED NONE DETECTED   Cocaine Metabolite,Ur [place] NONE DETECTED NONE DETECTED   Opiate, Ur Screen NONE DETECTED NONE DETECTED   Phencyclidine (PCP) Ur S NONE DETECTED NONE DETECTED   Cannabinoid 50 Ng, Ur [place] POSITIVE (A) NONE DETECTED   Barbiturates, Ur Screen NONE DETECTED NONE DETECTED   Benzodiazepine, Ur Scrn NONE DETECTED NONE DETECTED   Methadone Scn, Ur NONE DETECTED NONE DETECTED    Comment: (NOTE) Tricyclics + metabolites, urine    Cutoff 1000 ng/mL Amphetamines + metabolites, urine  Cutoff 1000 ng/mL MDMA (Ecstasy), urine              Cutoff 500 ng/mL Cocaine Metabolite, urine          Cutoff 300 ng/mL Opiate + metabolites, urine        Cutoff 300 ng/mL Phencyclidine (PCP), urine         Cutoff 25 ng/mL Cannabinoid, urine                 Cutoff 50 ng/mL Barbiturates + metabolites, urine  Cutoff 200 ng/mL Benzodiazepine, urine              Cutoff 200 ng/mL Methadone, urine                   Cutoff 300 ng/mL  The urine drug screen provides only a preliminary, unconfirmed analytical test result and should not be used for non-medical purposes. Clinical consideration and professional judgment should be applied to any positive drug screen result due to possible interfering substances. A more specific alternate chemical method must be used in order to obtain a confirmed analytical result. Gas chromatography / mass spectrometry (GC/MS) is the preferred confirm atory method. Performed at [facility], [address], [place], [place] [address]   ABO/Rh      Status: None   Collection Time: 01/22/21  6:28 AM  Result Value Ref Range   ABO/RH(D)      A POS Performed at [facility], [address] [place], [place] [address]   Surgical pathology     Status: None   Collection Time: 01/22/21  9:04 AM  Result Value Ref Range   SURGICAL PATHOLOGY      SURGICAL PATHOLOGY CASE: [id number] PATIENT: [name] Surgical Pathology Report     Specimen Submitted: A. Uterus, cervix, bil fallopian tubes  Clinical History: Persistent cervical dysplasia      DIAGNOSIS: A. 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 SEE COMMENT. - ENDOMETRIUM: MILDLY PROLIFERATIVE. - NEGATIVE FOR ATYPIA / EIN AND MALIGNANCY. - MYOMETRIUM:      -  NO SIGNIFICANT PATHOLOGIC ALTERATION. - BILATERAL FALLOPIAN TUBES:      - NO SIGNIFICANT PATHOLOGIC ALTERATION.  Comment: The cervix is involved by the patient's known CLL/SLL.  The cells of interest are positive for CD5, CD20, and CD23.  They are negative for CD10 and cyclinD1. CD3 highlights background T cells.  Ki67 proliferation index appears elevated (approximately 70-80% of the lymphocytes), which may be concerning for more a more aggressive subtype of  CLL. There is no definitive evidence of large cell/Richter transformation.  Clinical correlation is recommended.  IHC slides were prepared by [facility] for Molecular Biology and Pathology, [place], [place]. All controls stained appropriately.  This test was developed and its performance characteristics determined by [facility]. It has not been cleared or approved by the [place] Food and Drug Administration. The FDA does not require this test to go through premarket FDA review. This test is used for clinical purposes. It should not be regarded as investigational or for research. This laboratory is certified under the Clinical  Laboratory Improvement Amendments (CLIA) as qualified to perform high complexity clinical laboratory testing.   GROSS DESCRIPTION: A. Labeled: Uterus with cervix and bilateral fallopian tubes Received: In formalin Collection time: 9:04 AM on 01/22/2021 Placed into formalin time: 9:26 AM on 01/22/2021 Weight: 70.7 grams Dimensions:      Fundus -7.1 cm (cervix  to fundus), 3.5 cm (cornu to cornu), 3.1 cm (anterior to posterior)      Cervix - 4.5 x 4.0 x 3.0 (length) cm Serosa: Serosa is red-tan and smooth with diffuse congestion. Cervix: Ectocervical mucosa is yellow-tan with mild focal congestion. Periphery of ectocervical mucosa appears tan-red and mildly congested. The slit-like cervical os measures 1.0 x 0.1 cm.  Anterior cervical margin is inked blue, posterior cervical margin is inked black Endocervix: Endocervical canal is yellow-tan, ranging from 0.2 to 1.3 cm in diameter, dilating towards the cervical os.  No polyps or lesions are grossly identified. Endometrial cavity:      Dimensions -3.4 x 2.9 cm      Thickness -0.4 cm      Other findings -endometrium is yellow-tan, smooth and mildly glistening.  No polyps or lesions are grossly identified. Myometrium:     Thickness -1.9 cm     Other findings -myometrium is pink-tan, trabeculated, and glandular. No fibrotic nodules or definitive lesions are grossly identified.  Adnexa:     Fallopian tube A            Measurements -2.0 cm in length x 2.2 cm in diameter (fimbriated portion), 0.9 cm in length x 0.3 cm in diameter (non-fimbriated segment)          Other findings -Received is a portion of fallopian tube with attached fimbriae. A second segment of fallopian tube is also identified, connecting to the previously mentioned segment by a portion of pink-tan soft tissue, displaying evidence of possible previous ligation.  Serosa is red-tan and congested, though otherwise grossly unremarkable.  Sectioning reveals an occluded  lumen.      Left fallopian b           Measurements -1.0 cm in length x 0.5 cm in diameter (possible tube), 1.5 x 1.1 x 0.4 cm (entirety of tissue)           Other findings -Received is a single fragment of fimbriated tissue with possible tube.  A 0.5 cm paratubal cyst is identified, containing clear yellow fluid.  Serosa is red-tan.  Specimen is bisected to reveal a possible portion  of tube. Other comments: No oth er tissue is identified within the container  Block summary: 1 -representative cervix, 12-3 o'clock 2-representative cervix, 3-6 o'clock 3-representative cervix, 6-9 o'clock 4-representative cervix, 9-12 o'clock 5-anterior endomyometrium, full-thickness 6-posterior endometrium, full-thickness 7-9 fallopian tube A, entirely submitted 10-fallopian tube B, entirely submitted  BAS 01/22/21  Final Diagnosis performed by Betsy Pries, MD.   Electronically signed 01/26/2021 4:30:38PM The electronic signature indicates that the named Attending Pathologist has evaluated the specimen Technical component performed at [facility], [address], [place], [place] [address] Lab: [phone] Dir: Rush Farmer, MD, MMM  Professional component performed at [facility], [facility], [place], [place], [place] [address] Lab: [phone] Dir: Dellia Nims. Rubinas, MD   CBC     Status: Abnormal   Collection Time: 01/23/21  6:23 AM  Result Value Ref Range   WBC 9.7 4.0 - 10.5 K/uL   RBC 3.89 3.87 - 5.11 MIL/uL   Hemoglobin 12.4 12.0 - 15.0 g/dL   HCT 36.8 36.0 - 46.0 %   MCV 94.6 80.0 - 100.0 fL   MCH 31.9 26.0 - 34.0 pg   MCHC 33.7 30.0 - 36.0 g/dL   RDW 13.4 11.5 - 15.5 %   Platelets 143 (L) 150 - 400 K/uL   nRBC 0.0 0.0 - 0.2 %    Comment: Performed at [facility], [address] [place], [place] [address]  Creatinine, serum     Status: None   Collection Time: 01/23/21  6:23 AM  Result Value Ref Range   Creatinine, Ser 0.88 0.44 - 1.00 mg/dL   GFR,  Estimated >60 >60 mL/min    Comment: (NOTE) Calculated using the CKD-EPI Creatinine Equation (2021) Performed at [facility], [address] [place],  [address]      PHQ2/9: Depression screen [facility] 2/9 03/13/2021 01/04/2021 12/27/2020 12/06/2020 10/26/2020  Decreased Interest _0 Down, Depressed, Hopeless 0 _1 0  PHQ - 2 Score _2 Altered sleeping 0 _3 Tired, decreased energy _4 0  Change in appetite 0 _5 0  Feeling bad or failure about yourself  0 0 0 0 0  Trouble concentrating 0 1 1 0 0  Moving slowly or fidgety/restless 0 0 0 0 0  Suicidal thoughts 0 0 0 0 0  PHQ-9 Score _6 Difficult doing work/chores - Somewhat difficult Somewhat difficult - -  Some recent data might be hidden    phq 9 is positive   Fall Risk: Fall Risk  03/13/2021 02/28/2021 12/27/2020 12/06/2020 10/26/2020  Falls in the past year? 1 0 _7 Number falls in past yr: 1 0 _8 Injury with Fall? 0 0 0 0 0  Risk for fall due to : - - History of fall(s) - -  Follow up - - Falls evaluation completed - -     Functional Status Survey: Is the patient deaf or have difficulty hearing?: No Does the patient have difficulty seeing, even when wearing glasses/contacts?: Yes Does the patient have difficulty concentrating, remembering, or making decisions?: Yes (Remembering) Does the patient have difficulty walking or climbing stairs?: Yes Does the patient have difficulty dressing or bathing?: No Does the patient have difficulty doing errands alone such as visiting a doctor's office or shopping?: Yes (Daughter occasionally helps)   Assessment & Plan  1. Anxiety  - DULoxetine (  CYMBALTA) 60 MG capsule; Take 1 capsule (60 mg total) by mouth daily.  Dispense: 90 capsule; Refill: 0 - Ambulatory referral to Psychiatry  2. Mild episode of recurrent major depressive disorder (HCC)  - DULoxetine (CYMBALTA) 60 MG capsule; Take 1 capsule (60 mg total) by mouth daily.   Dispense: 90 capsule; Refill: 0 - Ambulatory referral to Psychiatry  3. Centrilobular emphysema (HCC)  Stable  4. Trigeminal neuralgia of left side of face  No pain at this time   5. GERD without esophagitis   6. CLL (chronic lymphocytic leukemia) ([place])  Under the care of hematologist   7. B12 deficiency   8. Vitamin D deficiency   9. History of MRSA infection  - mupirocin cream (BACTROBAN) 2 %; Apply 1 application topically 2 (two) times daily. On nostrils and also skin lesions  Dispense: 30 g; Refill: 0  10. Skin infection  - mupirocin cream (BACTROBAN) 2 %; Apply 1 application topically 2 (two) times daily. On nostrils and also skin lesions  Dispense: 30 g; Refill: 0  11. Thoracic aortic atherosclerosis ([place])  On statin therapy   12. Chronic neuropathic pain

## 2021-03-13 ENCOUNTER — Other Ambulatory Visit: Payer: Self-pay

## 2021-03-13 ENCOUNTER — Encounter: Payer: Self-pay | Admitting: Family Medicine

## 2021-03-13 ENCOUNTER — Ambulatory Visit: Payer: Medicaid Other | Admitting: Family Medicine

## 2021-03-13 VITALS — BP 100/60 | HR 83 | Temp 98.1°F | Resp 16 | Ht 67.0 in | Wt 175.0 lb

## 2021-03-13 DIAGNOSIS — E559 Vitamin D deficiency, unspecified: Secondary | ICD-10-CM

## 2021-03-13 DIAGNOSIS — I700 Atherosclerosis of aorta: Secondary | ICD-10-CM

## 2021-03-13 DIAGNOSIS — G500 Trigeminal neuralgia: Secondary | ICD-10-CM | POA: Diagnosis not present

## 2021-03-13 DIAGNOSIS — F330 Major depressive disorder, recurrent, mild: Secondary | ICD-10-CM

## 2021-03-13 DIAGNOSIS — E538 Deficiency of other specified B group vitamins: Secondary | ICD-10-CM

## 2021-03-13 DIAGNOSIS — J432 Centrilobular emphysema: Secondary | ICD-10-CM | POA: Diagnosis not present

## 2021-03-13 DIAGNOSIS — Z8614 Personal history of Methicillin resistant Staphylococcus aureus infection: Secondary | ICD-10-CM | POA: Diagnosis not present

## 2021-03-13 DIAGNOSIS — C9110 Chronic lymphocytic leukemia of B-cell type not having achieved remission: Secondary | ICD-10-CM

## 2021-03-13 DIAGNOSIS — F419 Anxiety disorder, unspecified: Secondary | ICD-10-CM

## 2021-03-13 DIAGNOSIS — G8929 Other chronic pain: Secondary | ICD-10-CM

## 2021-03-13 DIAGNOSIS — K219 Gastro-esophageal reflux disease without esophagitis: Secondary | ICD-10-CM | POA: Diagnosis not present

## 2021-03-13 DIAGNOSIS — M792 Neuralgia and neuritis, unspecified: Secondary | ICD-10-CM | POA: Diagnosis not present

## 2021-03-13 DIAGNOSIS — L089 Local infection of the skin and subcutaneous tissue, unspecified: Secondary | ICD-10-CM

## 2021-03-13 MED ORDER — MUPIROCIN CALCIUM 2 % EX CREA: 1.0000 "application " | TOPICAL_CREAM | Freq: Two times a day (BID) | CUTANEOUS | 0 refills | Status: DC

## 2021-03-13 MED ORDER — DULOXETINE HCL 60 MG PO CPEP: 60.0000 mg | ORAL_CAPSULE | Freq: Every day | ORAL | 0 refills | Status: DC

## 2021-03-19 ENCOUNTER — Other Ambulatory Visit: Payer: Self-pay | Admitting: Obstetrics and Gynecology

## 2021-03-19 ENCOUNTER — Other Ambulatory Visit: Payer: Self-pay | Admitting: Family Medicine

## 2021-03-19 DIAGNOSIS — Z8614 Personal history of Methicillin resistant Staphylococcus aureus infection: Secondary | ICD-10-CM

## 2021-03-19 DIAGNOSIS — L089 Local infection of the skin and subcutaneous tissue, unspecified: Secondary | ICD-10-CM

## 2021-03-20 NOTE — Telephone Encounter (Signed)
Requested medication (s) are due for refill today:  yes   Requested medication (s) are on the active medication list: yes   Last refill:  03/13/2021  Future visit scheduled: yes  Notes to clinic:  Medication not assigned to a protocol, review manually   Requested Prescriptions  Pending Prescriptions Disp Refills   mupirocin ointment (BACTROBAN) 2 % [Pharmacy Med Name: mupirocin 2 % topical ointment] 22 g 0    Sig: Apply 1 application topically 2 (two) times daily. On nostrils and also skin lesions      Off-Protocol Failed - 03/19/2021  3:33 PM      Failed - Medication not assigned to a protocol, review manually.      Passed - Valid encounter within last 12 months    Recent Outpatient Visits           1 week ago Centrilobular emphysema [facility])   [facility] [name], MD   2 months ago Localized swelling of both lower legs   [place], [place], [place]   3 months ago CLL (chronic lymphocytic leukemia) [facility])   [facility] [name], MD   4 months ago Well adult exam   [facility] [name], MD   4 months ago [facility] [name], MD       Future Appointments             In 2 weeks [name], MD [place]   In 2 months [name], MD [facility], [place]   In 7 months [name], MD [facility], [facility]

## 2021-03-22 ENCOUNTER — Ambulatory Visit: Payer: Medicaid Other | Admitting: Licensed Clinical Social Worker

## 2021-03-22 ENCOUNTER — Other Ambulatory Visit: Payer: Self-pay

## 2021-03-22 NOTE — Patient Outreach (Signed)
Tizanidine and Meloxicam denied last month by pain doctor until she is seen by provider. Asked PCP for script and she said patient has to get from pain specialist.  Spoke with patient and she asked me to ask Dr. Melrose Nakayama for refill of Tizanidine and Meloxicam. Spoke with Loma Sousa at Dr. Lannie Fields, they will let doctor know  Due 03/29/21  Magnesium Oxide  500 mg  1     Sd Calcium+D3   600mg -500u 2 2  2   Pregabalin   100 mg  1 1  1   Loratadine   10 mg  1     Duloxetine   60 mg     1  Atorvastatin   10 mg     1  Omeprazole   20 mg  1     Vitamin B12   50 mcg  0.5     Setraline   100 mg  1.5     Mupirocin Ointment  2%       Gabapentin   100 mg       Estradiol Patch   0.06mg        Iron

## 2021-03-23 ENCOUNTER — Other Ambulatory Visit: Payer: Self-pay | Admitting: Obstetrics and Gynecology

## 2021-03-28 ENCOUNTER — Other Ambulatory Visit: Payer: Self-pay | Admitting: Obstetrics and Gynecology

## 2021-03-28 NOTE — Patient Instructions (Signed)
HI Ms. Mckibbin, sorry I missed you today, I hope you are doing well - as a part of your Medicaid benefit, you are eligible for care management and care coordination services at no cost or copay. I was unable to reach you by phone today but would be happy to help you with your health related needs. Please feel free to call me at [phone].  A member of the Managed Medicaid care management team will reach out to you again over the next 7-14 days.   Aida Raider RN, BSN [place]  [facility] - Managed Medicaid High Risk [phone].

## 2021-03-28 NOTE — Progress Notes (Signed)
Patient: Monique [name]  Service Category: E/M  Provider: Gaspar Cola, MD  DOB: April 03, 1971  DOS: 03/29/2021  Location: Office  MRN: [id number]  Setting: Ambulatory outpatient  Referring Provider: [name], MD  Type: Established Patient  Specialty: Interventional Pain Management  PCP: [name], MD  Location: Remote location  Delivery: TeleHealth     Virtual Encounter - Pain Management PROVIDER NOTE: Information contained herein reflects review and annotations entered in association with encounter. Interpretation of such information and data should be left to medically-trained personnel. Information provided to patient can be located elsewhere in the medical record under "Patient Instructions". Document created using STT-dictation technology, any transcriptional errors that may result from process are unintentional.    Contact & Pharmacy Preferred: [phone] Home: [phone] (home) Mobile: [phone] (mobile) E-mail: [email or web address]  [facility] - [place], [place] - [address] [place] [address] Phone: [phone] Fax: [phone]  [place], [place] - [address] [place] [address] Phone: [phone] Fax: [phone]   Pre-screening  Monique [name] offered "in-person" vs "virtual" encounter. She indicated preferring virtual for this encounter.   Reason COVID-19*  Social distancing based on CDC and AMA recommendations.   I contacted Monique [name] on 03/29/2021 via telephone.      I clearly identified myself as Gaspar Cola, MD. I verified that I was speaking with the correct person using two identifiers (Name: Monique [name], and date of birth: [birth year]).  Consent I sought verbal advanced consent from Monique [name] for virtual visit interactions. I informed Monique [name] of possible security and privacy concerns, risks,  and limitations associated with providing "not-in-person" medical evaluation and management services. I also informed Monique [name] of the availability of "in-person" appointments. Finally, I informed her that there would be a charge for the virtual visit and that she could be  personally, fully or partially, financially responsible for it. Monique [name] expressed understanding and agreed to proceed.   Historic Elements   Monique [name] is a 50 y.o. year old, female patient evaluated today after our last contact on 01/15/2021. Monique [name]  has a past medical history of Anxiety, Bursitis, Carpal tunnel syndrome, Cervical dysplasia, Chronic lymphocytic leukemia ([place]) (2018), COPD (chronic obstructive pulmonary disease) ([place]), COVID-19 (2021), Depression, Eating disorder, GERD (gastroesophageal reflux disease), History of self-harm, Insomnia, Obsession, Tobacco abuse, and Vitamin B12 deficiency (non anemic). She also  has a past surgical history that includes Cervical biopsy w/ loop electrode excision; Tubal ligation; OTHER SURGICAL HISTORY; Colonoscopy with propofol (N/A, 04/21/2019); Breast biopsy (Right, 01/05/2018); vocal cord surgery (2005); and Vaginal hysterectomy (N/A, 01/22/2021). Monique [name] has a current medication list which includes the following prescription(s): mupirocin ointment, advair diskus, vitamin c, atorvastatin, gnp calcium 1200, eucrisa, duloxetine, estradiol, ferrous sulfate, ibrutinib, loratadine, magnesium, meloxicam, omeprazole, rhofade, pregabalin, opzelura, sertraline, tizanidine, and vitamin b-12. She  reports that she has been smoking cigarettes. She started smoking about 34 years ago. She has a 31.00 pack-year smoking history. She has never used smokeless tobacco. She reports previous alcohol use. She reports previous drug use. Drug: Marijuana. Monique [name] has No Known Allergies.   HPI  Today, she is being contacted for follow-up evaluation.  This patient was originally scheduled for a  face-to-face evaluation but apparently it was a later changed.  This patient was last seen on 08/07/2020 at which time I had schedule her to  return for a left-sided L4-5 LESI + a bilateral L5 transforaminal epidural steroid injection.  For some reason, this was not done.  Having said this, the patient indicates that the only reason why she wanted to contact me today was so that I could send a refill on her meloxicam and tizanidine.  I called her attention to "MY CHART" system where I sent a letter to her and to her PCP Dr. Etter Sjogren indicating on 07/18/2020 that we would be transferring those medications to the PCP.  We have done this on all of our patients in order to be able to continue providing services to the community but limited only to the opioid analgesics.  I have agreed to send send a 30-day prescription to the pharmacy, but I have reminded the patient that I will not continue refilling those medications.  The patient indicates doing well with the current medication regimen. No adverse reactions or side effects reported to the medications.   Today I took the time to explain to the patient that this will be the very last time that we will be sending a refill for these medicines.  Nonopioids transferred 08/07/2020 & 03/29/2021: Meloxicam and tizanidine  The patient indicates that she spoke to Dr. Etter Sjogren about the medications and apparently there was a misunderstanding or perhaps Dr. Ancil Boozer did not get my initial letter on 08/07/2020 indicating the transfer.  Today I will be sending another letter reminding her of the transfer.  As I stated before, she was last seen in our office on 08/07/2020 and scheduled to return for a left-sided L4-5 LESI + bilateral L5 transforaminal epidural steroid injection under fluoroscopic guidance and IV sedation.  She did not keep this appointment and I seriously doubt that she is intending to return.  For only intended purposes, we are really not following  up with this patient and much less for medication management of an NSAID and a muscle relaxant.  Pharmacotherapy Assessment   Analgesic: No opioid analgesics prescribed by our practice. See 05/13/2018 UDS (+) Undeclared THC.   Monitoring: [place] PMP: PDMP reviewed during this encounter.       Pharmacotherapy: No side-effects or adverse reactions reported. Compliance: No problems identified. Effectiveness: Clinically acceptable. Plan: Refer to "POC". UDS:  Summary  Date Value Ref Range Status  05/13/2018 FINAL  Final    Comment:    ==================================================================== TOXASSURE COMP DRUG ANALYSIS,UR ==================================================================== Test                             Result       Flag       Units Drug Present and Declared for Prescription Verification   Gabapentin                     PRESENT      EXPECTED Drug Present not Declared for Prescription Verification   Carboxy-THC                    213          UNEXPECTED ng/mg creat    Carboxy-THC is a metabolite of tetrahydrocannabinol  (THC).    Source of [facility] is most commonly illicit, but THC is also present    in a scheduled prescription medication.   Naproxen                       PRESENT  UNEXPECTED Drug Absent but Declared for Prescription Verification   Hydrocodone                    Not Detected UNEXPECTED ng/mg creat   Tramadol                       Not Detected UNEXPECTED ng/mg creat   Citalopram                     Not Detected UNEXPECTED   Prochlorperazine               Not Detected UNEXPECTED   Acetaminophen                  Not Detected UNEXPECTED    Acetaminophen, as indicated in the declared medication list, is    not always detected even when used as directed. ==================================================================== Test                      Result    Flag   Units      Ref Range   Creatinine              218              mg/dL       >=20 ==================================================================== Declared Medications:  The flagging and interpretation on this report are based on the  following declared medications.  Unexpected results may arise from  inaccuracies in the declared medications.  **Note: The testing scope of this panel includes these medications:  Escitalopram (Lexapro)  Gabapentin (Neurontin)  Hydrocodone (Norco)  Prochlorperazine (Compazine)  Tramadol (Ultram)  **Note: The testing scope of this panel does not include small to  moderate amounts of these reported medications:  Acetaminophen (Norco)  **Note: The testing scope of this panel does not include following  reported medications:  Umeclidinium (Anoro Ellipta)  Varenicline (Chantix)  Vilanterol (Anoro Ellipta) ==================================================================== For clinical consultation, please call [phone]. ====================================================================      Laboratory Chemistry Profile   Renal Lab Results  Component Value Date   BUN 16 01/01/2021   CREATININE 0.88 02/54/2706   BCR NOT APPLICABLE 23/76/2831   GFRAA 79 12/06/2020   GFRNONAA >60 01/23/2021    Hepatic Lab Results  Component Value Date   AST 18 01/01/2021   ALT 13 01/01/2021   ALBUMIN 3.9 01/01/2021   ALKPHOS 59 01/01/2021   HCVAB <0.1 01/22/2017    Electrolytes Lab Results  Component Value Date   NA 137 01/01/2021   K 3.8 01/01/2021   CL 104 01/01/2021   CALCIUM 8.4 (L) 01/01/2021   MG 1.9 09/28/2020   PHOS 3.0 09/28/2020    Bone Lab Results  Component Value Date   VD25OH 51 08/31/2019   25OHVITD1 27 (L) 05/13/2018   25OHVITD2 <1.0 05/13/2018   25OHVITD3 27 05/13/2018    Inflammation (CRP: Acute Phase) (ESR: Chronic Phase) Lab Results  Component Value Date   CRP 0.7 08/21/2018   ESRSEDRATE 2 08/21/2018         Note: Above Lab results reviewed.  Imaging  VAS [place] ABI WITH/WO TBI  LOWER  EXTREMITY DOPPLER STUDY  Patient Name:  Monique [name]  Date of Exam:   03/05/2021 Medical Rec #: [id number]         Accession #:    [id number] Date of Birth: [birth year]        Patient Gender: F Patient Age:  49Y Exam Location:  [facility] Procedure:      VAS [place] ABI WITH/WO TBI Referring Phys: [id number] GREGORY G SCHNIER  --------------------------------------------------------------------------------   Indications: Discoloration of Foot.   Performing Technologist: Almira Coaster RVS    Examination Guidelines: A complete evaluation includes at minimum, Doppler waveform signals and systolic blood pressure reading at the level of bilateral brachial, anterior tibial, and posterior tibial arteries, when vessel segments are accessible. Bilateral testing is considered an integral part of a complete examination. Photoelectric Plethysmograph (PPG) waveforms and toe systolic pressure readings are included as required and additional duplex testing as needed. Limited examinations for reoccurring indications may be performed as noted.    ABI Findings: +---------+------------------+-----+---------+--------+ Right    Rt Pressure (mmHg)IndexWaveform Comment  +---------+------------------+-----+---------+--------+ Brachial 123                                      +---------+------------------+-----+---------+--------+ ATA      127               1.03 triphasic         +---------+------------------+-----+---------+--------+ PTA      164               1.33 triphasic         +---------+------------------+-----+---------+--------+ Great Toe250               2.03 Normal            +---------+------------------+-----+---------+--------+  +---------+------------------+-----+---------+-------+ Left     Lt Pressure (mmHg)IndexWaveform Comment +---------+------------------+-----+---------+-------+ Brachial 116                                      +---------+------------------+-----+---------+-------+ ATA      133               1.08 triphasic        +---------+------------------+-----+---------+-------+ PTA      157               1.28 triphasic        +---------+------------------+-----+---------+-------+ Great Toe144               1.17 Normal           +---------+------------------+-----+---------+-------+  +-------+-----------+-----------+------------+------------+ ABI/TBIToday's ABIToday's TBIPrevious ABIPrevious TBI +-------+-----------+-----------+------------+------------+ Right  1.33       2.03                                +-------+-----------+-----------+------------+------------+ Left   1.28       1.17                                +-------+-----------+-----------+------------+------------+    Summary: Right: Resting right ankle-brachial index is within normal range. No evidence of significant right lower extremity arterial disease. The right toe-brachial index is normal.  Left: Resting left ankle-brachial index is within normal range. No evidence of significant left lower extremity arterial disease. The left toe-brachial index is normal.    *See table(s) above for measurements and observations.    Electronically signed by Hortencia Pilar MD on 03/05/2021 at 4:32:34 PM.       Final    Assessment  Diagnoses of Lumbar facet arthropathy, Chronic low back pain (  Secondary Area of Pain) (Bilateral) (R>L) w/ sciatica (Bilateral), and Chronic musculoskeletal pain were pertinent to this visit.  Plan of Care  Problem-specific:  No problem-specific Assessment & Plan notes found for this encounter.  Monique [name] has a current medication list which includes the following long-term medication(s): advair diskus, atorvastatin, gnp calcium 1200, duloxetine, estradiol, ferrous sulfate, loratadine, meloxicam, omeprazole, pregabalin, sertraline, and tizanidine.  Pharmacotherapy  (Medications Ordered): Meds ordered this encounter  Medications   meloxicam (MOBIC) 15 MG tablet    Sig: Take 1 tablet (15 mg total) by mouth daily.    Dispense:  30 tablet    Refill:  0    Fill 1 day early if pharmacy is closed on scheduled refill date. Generic permitted. Void old duplicate prescription / refill. Do not send renewal requests.   tiZANidine (ZANAFLEX) 2 MG tablet    Sig: Take 1 tablet (2 mg total) by mouth every 8 (eight) hours as needed for muscle spasms.    Dispense:  90 tablet    Refill:  0    Fill 1 day early if pharmacy is closed on scheduled refill date. Generic permitted. Void old duplicate prescription / refill. Do not send renewal requests.    Orders:  No orders of the defined types were placed in this encounter.  Follow-up plan:   Return if symptoms worsen or fail to improve.     Interventional management options: Planned, scheduled, and/or pending:   Procedure (w/ sedation): (L) L4-5 LESI + (B) L5 TFESI.    Considering:   Diagnostic/therapeutic right L3-4 LESI #3   Diagnostic/therapeutic right L4 TFESI #3  Diagnostic bilateral lumbar facet nerve block #1  Possible bilateral lumbar facet RFA    Palliative PRN treatment(s):   Palliative right L4 TFESI + right L3-4 LESI (IV sedation preferred)    Recent Visits No visits were found meeting these conditions. Showing recent visits within past 90 days and meeting all other requirements Today's Visits Date Type Provider Dept  03/29/21 Telemedicine [name], MD [facility]  Showing today's visits and meeting all other requirements Future Appointments No visits were found meeting these conditions. Showing future appointments within next 90 days and meeting all other requirements I discussed the assessment and treatment plan with the patient. The patient was provided an opportunity to ask questions and all were answered. The patient agreed with the plan and demonstrated an understanding  of the instructions.  Patient advised to call back or seek an in-person evaluation if the symptoms or condition worsens.  Duration of encounter: 15 minutes.  Note by: Gaspar Cola, MD Date: 03/29/2021; Time: 5:46 PM

## 2021-03-28 NOTE — Patient Outreach (Signed)
Care Coordination  03/28/2021  Monique [name] 04-28-1971 [id number]   Medicaid Managed Care   Unsuccessful Outreach Note  03/28/2021 Name: Monique [name] MRN: [id number] DOB: 1971/01/14  Referred by: [name], MD Reason for referral : High Risk Managed Medicaid (Unsuccessful telephone outreach)   An unsuccessful telephone outreach was attempted today. The patient was referred to the case management team for assistance with care management and care coordination.   Follow Up Plan: A member of the Managed Medicaid care management team will reach out to the patient again over the next 7-14 days.   Aida Raider RN, BSN [name]  [facility] - Managed Medicaid High Risk [phone].

## 2021-03-29 ENCOUNTER — Ambulatory Visit: Payer: Medicaid Other | Attending: Pain Medicine | Admitting: Pain Medicine

## 2021-03-29 ENCOUNTER — Other Ambulatory Visit: Payer: Self-pay

## 2021-03-29 DIAGNOSIS — M7918 Myalgia, other site: Secondary | ICD-10-CM | POA: Diagnosis not present

## 2021-03-29 DIAGNOSIS — M5442 Lumbago with sciatica, left side: Secondary | ICD-10-CM | POA: Diagnosis not present

## 2021-03-29 DIAGNOSIS — G8929 Other chronic pain: Secondary | ICD-10-CM | POA: Diagnosis not present

## 2021-03-29 DIAGNOSIS — M47816 Spondylosis without myelopathy or radiculopathy, lumbar region: Secondary | ICD-10-CM | POA: Diagnosis not present

## 2021-03-29 DIAGNOSIS — M5441 Lumbago with sciatica, right side: Secondary | ICD-10-CM

## 2021-03-29 MED ORDER — MELOXICAM 15 MG PO TABS: 15.0000 mg | ORAL_TABLET | Freq: Every day | ORAL | 0 refills | Status: DC

## 2021-03-29 MED ORDER — TIZANIDINE HCL 2 MG PO TABS: 2.0000 mg | ORAL_TABLET | Freq: Three times a day (TID) | ORAL | 0 refills | Status: DC | PRN

## 2021-04-02 ENCOUNTER — Other Ambulatory Visit: Payer: Medicaid Other

## 2021-04-02 ENCOUNTER — Other Ambulatory Visit (HOSPITAL_COMMUNITY): Payer: Self-pay

## 2021-04-04 ENCOUNTER — Ambulatory Visit: Payer: Medicaid Other | Admitting: Dermatology

## 2021-04-09 ENCOUNTER — Other Ambulatory Visit (HOSPITAL_COMMUNITY): Payer: Self-pay

## 2021-04-12 ENCOUNTER — Inpatient Hospital Stay: Payer: Medicaid Other | Attending: Oncology | Admitting: Oncology

## 2021-04-12 ENCOUNTER — Other Ambulatory Visit: Payer: Self-pay

## 2021-04-12 DIAGNOSIS — Z79899 Other long term (current) drug therapy: Secondary | ICD-10-CM | POA: Diagnosis not present

## 2021-04-12 DIAGNOSIS — D509 Iron deficiency anemia, unspecified: Secondary | ICD-10-CM

## 2021-04-12 LAB — COMPREHENSIVE METABOLIC PANEL
ALT: 10 U/L (ref 0–44)
AST: 15 U/L (ref 15–41)
Albumin: 3.7 g/dL (ref 3.5–5.0)
Alkaline Phosphatase: 78 U/L (ref 38–126)
Anion gap: 8 (ref 5–15)
BUN: 18 mg/dL (ref 6–20)
CO2: 26 mmol/L (ref 22–32)
Calcium: 8.6 mg/dL — ABNORMAL LOW (ref 8.9–10.3)
Chloride: 101 mmol/L (ref 98–111)
Creatinine, Ser: 0.85 mg/dL (ref 0.44–1.00)
GFR, Estimated: >60 mL/min (ref >60)
Glucose, Bld: 89 mg/dL (ref 70–99)
Potassium: 3.9 mmol/L (ref 3.5–5.1)
Sodium: 135 mmol/L (ref 135–145)
Total Bilirubin: 0.5 mg/dL (ref 0.3–1.2)
Total Protein: 6.4 g/dL — ABNORMAL LOW (ref 6.5–8.1)

## 2021-04-12 LAB — CBC
HCT: 42.1 % (ref 36.0–46.0)
Hemoglobin: 13.7 g/dL (ref 12.0–15.0)
MCH: 30.7 pg (ref 26.0–34.0)
MCHC: 32.5 g/dL (ref 30.0–36.0)
MCV: 94.4 fL (ref 80.0–100.0)
Platelets: 190 10*3/uL (ref 150–400)
RBC: 4.46 MIL/uL (ref 3.87–5.11)
RDW: 13.1 % (ref 11.5–15.5)
WBC: 15 10*3/uL — ABNORMAL HIGH (ref 4.0–10.5)
nRBC: 0 % (ref 0.0–0.2)

## 2021-04-12 LAB — IRON AND TIBC
Iron: 33 ug/dL (ref 28–170)
Saturation Ratios: 8 % — ABNORMAL LOW (ref 10.4–31.8)
TIBC: 407 ug/dL (ref 250–450)
UIBC: 374 ug/dL

## 2021-04-12 LAB — FERRITIN: Ferritin: 6 ng/mL — ABNORMAL LOW (ref 11–307)

## 2021-04-12 LAB — VITAMIN B12: Vitamin B-12: 678 pg/mL (ref 180–914)

## 2021-04-12 NOTE — Progress Notes (Signed)
Fabio Pierce,   Would you mind calling her and letting her know she need IV iron. I will send a schedule message to get her scheduled.   Thanks,   Sonia Baller

## 2021-04-16 NOTE — Progress Notes (Signed)
Patient is aware of the need for IV Venofer.  She will be receiving Feraheme.

## 2021-04-19 ENCOUNTER — Inpatient Hospital Stay: Payer: Medicare Other

## 2021-04-20 ENCOUNTER — Telehealth: Payer: Self-pay

## 2021-04-20 ENCOUNTER — Other Ambulatory Visit: Payer: Self-pay | Admitting: Obstetrics and Gynecology

## 2021-04-20 NOTE — Telephone Encounter (Signed)
Copied from [place] [phone]. Topic: General - Call Back - No Documentation >> Apr 20, 2021  1:34 PM Erick Blinks wrote: Reason for CRM:   Hailey from [facility] [phone] called requesting to speak with the clinic regarding the patient's iron Rx, she has questions about whether this will interfere with infusions that she is receiving. Please advise

## 2021-04-21 ENCOUNTER — Encounter: Payer: Self-pay | Admitting: Oncology

## 2021-04-23 ENCOUNTER — Other Ambulatory Visit: Payer: Self-pay | Admitting: Obstetrics and Gynecology

## 2021-04-23 NOTE — Patient Outreach (Signed)
Care Coordination  04/23/2021  Monique [name] April 07, 1971 [id number]   Medicaid Managed Care   Unsuccessful Outreach Note  04/23/2021 Name: Monique [name] MRN: [id number] DOB: 08-Sep-1971  Referred by: [name], MD Reason for referral : High Risk Managed Medicaid (Unsuccessful telephone outreach)   A second unsuccessful telephone outreach was attempted today. The patient was referred to the case management team for assistance with care management and care coordination.   Follow Up Plan: A member of the Managed Medicaid care management team will reach out to the patient again over the next 7-14 days.   Aida Raider RN, BSN [place]  [facility] - Managed Medicaid High Risk [phone].

## 2021-04-23 NOTE — Patient Instructions (Signed)
Hi Monique [name], I am sorry I missed you today, I hope you are doing okay- as a part of your Medicaid benefit, you are eligible for care management and care coordination services at no cost or copay. I was unable to reach you by phone today but would be happy to help you with your health related needs. Please feel free to call me at [phone].  A member of the Managed Medicaid care management team will reach out to you again over the next 7-14 days.   Aida Raider RN, BSN [employer]  [facility] - Managed Medicaid High Risk [phone].

## 2021-04-24 ENCOUNTER — Telehealth: Payer: Self-pay | Admitting: Family Medicine

## 2021-04-24 ENCOUNTER — Encounter: Payer: Self-pay | Admitting: Oncology

## 2021-04-24 NOTE — Telephone Encounter (Signed)
..   Medicaid Managed Care   Unsuccessful Outreach Note  04/24/2021 Name: Monique [name] MRN: [id number] DOB: 1971-04-06  Referred by: [name], MD Reason for referral : High Risk Managed Medicaid (Attempted to reach patient today to get her rescheduled for a phone visit with the MM RNCM. I was not able to leave a message and I am not certain her phone is in order. )   An unsuccessful telephone outreach was attempted today. The patient was referred to the case management team for assistance with care management and care coordination.   Follow Up Plan: The care management team will reach out to the patient again over the next 7-14 days.   [place]

## 2021-04-26 ENCOUNTER — Inpatient Hospital Stay: Payer: Medicare Other | Attending: Oncology

## 2021-04-26 VITALS — BP 108/68 | HR 72

## 2021-04-26 DIAGNOSIS — D509 Iron deficiency anemia, unspecified: Secondary | ICD-10-CM | POA: Insufficient documentation

## 2021-04-26 MED ORDER — SODIUM CHLORIDE 0.9 % IV SOLN
510.0000 mg | Freq: Once | INTRAVENOUS | Status: AC
  Administered 2021-04-26: 510 mg via INTRAVENOUS
  Filled 2021-04-26: qty 510

## 2021-04-26 MED ORDER — SODIUM CHLORIDE 0.9 % IV SOLN
Freq: Once | INTRAVENOUS | Status: AC
  Filled 2021-04-26: qty 250

## 2021-04-26 NOTE — Patient Instructions (Signed)
[facility] [place] [facility]  Discharge Instructions: Thank you for choosing [facility] to provide your oncology and hematology care.  If you have a lab appointment with the Cancer Center, please go directly to the Cancer Center and check in at the registration area.  Wear comfortable clothing and clothing appropriate for easy access to any Portacath or PICC line.   We strive to give you quality time with your provider. You may need to reschedule your appointment if you arrive late (15 or more minutes).  Arriving late affects you and other patients whose appointments are after yours.  Also, if you miss three or more appointments without notifying the office, you may be dismissed from the clinic at the provider's discretion.      For prescription refill requests, have your pharmacy contact our office and allow 72 hours for refills to be completed.    Today you received the following : Feraheme   To help prevent nausea and vomiting after your treatment, we encourage you to take your nausea medication as directed.  BELOW ARE SYMPTOMS THAT SHOULD BE REPORTED IMMEDIATELY: *FEVER GREATER THAN 100.4 F (38 C) OR HIGHER *CHILLS OR SWEATING *NAUSEA AND VOMITING THAT IS NOT CONTROLLED WITH YOUR NAUSEA MEDICATION *UNUSUAL SHORTNESS OF BREATH *UNUSUAL BRUISING OR BLEEDING *URINARY PROBLEMS (pain or burning when urinating, or frequent urination) *BOWEL PROBLEMS (unusual diarrhea, constipation, pain near the anus) TENDERNESS IN MOUTH AND THROAT WITH OR WITHOUT PRESENCE OF ULCERS (sore throat, sores in mouth, or a toothache) UNUSUAL RASH, SWELLING OR PAIN  UNUSUAL VAGINAL DISCHARGE OR ITCHING   Items with * indicate a potential emergency and should be followed up as soon as possible or go to the Emergency Department if any problems should occur.  Please show the CHEMOTHERAPY ALERT CARD or IMMUNOTHERAPY ALERT CARD at check-in to the Emergency Department and triage  nurse.  Should you have questions after your visit or need to cancel or reschedule your appointment, please contact [facility] [place] [facility]  [phone] and follow the prompts.  Office hours are 8:00 a.m. to 4:30 p.m. Monday - Friday. Please note that voicemails left after 4:00 p.m. may not be returned until the following business day.  We are closed weekends and major holidays. You have access to a nurse at all times for urgent questions. Please call the main number to the clinic [phone] and follow the prompts.  For any non-urgent questions, you may also contact your provider using MyChart. We now offer e-Visits for anyone 18 and older to request care online for non-urgent symptoms. For details visit [email or web address].   Also download the MyChart app! Go to the app store, search "MyChart", open the app, select [facility], and log in with your MyChart username and password.  Due to Covid, a mask is required upon entering the hospital/clinic. If you do not have a mask, one will be given to you upon arrival. For doctor visits, patients may have 1 support person aged 18 or older with them. For treatment visits, patients cannot have anyone with them due to current Covid guidelines and our immunocompromised population.  

## 2021-04-27 ENCOUNTER — Telehealth: Payer: Self-pay | Admitting: Family Medicine

## 2021-04-27 NOTE — Telephone Encounter (Signed)
..   Medicaid Managed Care   Unsuccessful Outreach Note  04/27/2021 Name: Monique [name] MRN: [id number] DOB: April 11, 1971  Referred by: [name], MD Reason for referral : High Risk Managed Medicaid (Attempted to reach Ms.Door today to get her rescheduled for a phone visit with the [facility]. Her phone rings several times and then it disconnects. )   A second unsuccessful telephone outreach was attempted today. The patient was referred to the case management team for assistance with care management and care coordination.   Follow Up Plan: The care management team will reach out to the patient again over the next 7-14 days.   [place]

## 2021-05-02 ENCOUNTER — Encounter: Payer: Self-pay | Admitting: Oncology

## 2021-05-02 ENCOUNTER — Other Ambulatory Visit (HOSPITAL_COMMUNITY): Payer: Self-pay

## 2021-05-02 ENCOUNTER — Other Ambulatory Visit: Payer: Self-pay | Admitting: Dermatology

## 2021-05-02 DIAGNOSIS — L719 Rosacea, unspecified: Secondary | ICD-10-CM

## 2021-05-03 ENCOUNTER — Ambulatory Visit: Payer: Medicaid Other

## 2021-05-03 ENCOUNTER — Inpatient Hospital Stay: Payer: Medicare Other

## 2021-05-03 ENCOUNTER — Telehealth: Payer: Self-pay | Admitting: Family Medicine

## 2021-05-03 VITALS — BP 115/59 | HR 74 | Temp 97.2°F | Resp 16

## 2021-05-03 DIAGNOSIS — D509 Iron deficiency anemia, unspecified: Secondary | ICD-10-CM

## 2021-05-03 MED ORDER — SODIUM CHLORIDE 0.9 % IV SOLN
INTRAVENOUS | Status: DC
  Filled 2021-05-03: qty 250

## 2021-05-03 MED ORDER — SODIUM CHLORIDE 0.9 % IV SOLN
510.0000 mg | Freq: Once | INTRAVENOUS | Status: AC
  Administered 2021-05-03: 510 mg via INTRAVENOUS
  Filled 2021-05-03: qty 510

## 2021-05-03 NOTE — Telephone Encounter (Signed)
..   Medicaid Managed Care   Unsuccessful Outreach Note  05/03/2021 Name: Monique [name] MRN: [id number] DOB: 1971/02/13  Referred by: [name], MD Reason for referral : High Risk Managed Medicaid (I attempted to reach the patient today to get her rescheduled for a  phone visit with the MM RNCM. Her phone rang several times and then disconnected.) and Appointment   Third unsuccessful telephone outreach was attempted today. The patient was referred to the case management team for assistance with care management and care coordination. The patient's primary care provider has been notified of our unsuccessful attempts to make or maintain contact with the patient. The care management team is pleased to engage with this patient at any time in the future should he/she be interested in assistance from the care management team.   Follow Up Plan: We have been unable to make contact with the patient for follow up. The care management team is available to follow up with the patient after provider conversation with the patient regarding recommendation for care management engagement and subsequent re-referral to the care management team.   [place], [place]

## 2021-05-10 ENCOUNTER — Ambulatory Visit: Payer: Medicaid Other

## 2021-05-15 ENCOUNTER — Other Ambulatory Visit (HOSPITAL_COMMUNITY): Payer: Self-pay

## 2021-05-17 ENCOUNTER — Other Ambulatory Visit (HOSPITAL_COMMUNITY): Payer: Self-pay

## 2021-05-19 ENCOUNTER — Emergency Department
Admission: EM | Admit: 2021-05-19 | Discharge: 2021-05-19 | Disposition: A | Discharge status: Home / Self Care | Payer: Medicare Other | Attending: Emergency Medicine | Admitting: Emergency Medicine

## 2021-05-19 ENCOUNTER — Emergency Department: Payer: Medicare Other

## 2021-05-19 ENCOUNTER — Other Ambulatory Visit: Payer: Self-pay

## 2021-05-19 ENCOUNTER — Encounter: Payer: Self-pay | Admitting: Emergency Medicine

## 2021-05-19 DIAGNOSIS — Z8616 Personal history of COVID-19: Secondary | ICD-10-CM | POA: Diagnosis not present

## 2021-05-19 DIAGNOSIS — S91209A Unspecified open wound of unspecified toe(s) with damage to nail, initial encounter: Secondary | ICD-10-CM

## 2021-05-19 DIAGNOSIS — S91202A Unspecified open wound of left great toe with damage to nail, initial encounter: Secondary | ICD-10-CM | POA: Insufficient documentation

## 2021-05-19 DIAGNOSIS — M7732 Calcaneal spur, left foot: Secondary | ICD-10-CM | POA: Diagnosis not present

## 2021-05-19 DIAGNOSIS — F17210 Nicotine dependence, cigarettes, uncomplicated: Secondary | ICD-10-CM | POA: Diagnosis not present

## 2021-05-19 DIAGNOSIS — Z23 Encounter for immunization: Secondary | ICD-10-CM | POA: Diagnosis not present

## 2021-05-19 DIAGNOSIS — W228XXA Striking against or struck by other objects, initial encounter: Secondary | ICD-10-CM | POA: Insufficient documentation

## 2021-05-19 DIAGNOSIS — J449 Chronic obstructive pulmonary disease, unspecified: Secondary | ICD-10-CM | POA: Diagnosis not present

## 2021-05-19 DIAGNOSIS — S99922A Unspecified injury of left foot, initial encounter: Secondary | ICD-10-CM | POA: Diagnosis not present

## 2021-05-19 MED ORDER — CEPHALEXIN 500 MG PO CAPS: 1000.0000 mg | ORAL_CAPSULE | Freq: Two times a day (BID) | ORAL | 0 refills | Status: DC

## 2021-05-19 MED ORDER — TETANUS-DIPHTH-ACELL PERTUSSIS 5-2.5-18.5 LF-MCG/0.5 IM SUSY
0.5000 mL | PREFILLED_SYRINGE | Freq: Once | INTRAMUSCULAR | Status: AC
  Administered 2021-05-19: 0.5 mL via INTRAMUSCULAR
  Filled 2021-05-19: qty 0.5

## 2021-05-19 MED ORDER — LIDOCAINE HCL (PF) 1 % IJ SOLN
10.0000 mL | Freq: Once | INTRAMUSCULAR | Status: AC
  Administered 2021-05-19: 10 mL
  Filled 2021-05-19: qty 10

## 2021-05-19 MED ORDER — SILVER NITRATE-POT NITRATE 75-25 % EX MISC
3.0000 | Freq: Once | CUTANEOUS | Status: AC
  Administered 2021-05-19: 3 via TOPICAL
  Filled 2021-05-19: qty 10

## 2021-05-19 NOTE — ED Provider Notes (Signed)
[facility] Emergency Department Provider Note  ____________________________________________  Time seen: Approximately 3:52 PM  I have reviewed the triage vital signs and the nursing notes.   HISTORY  Chief Complaint Toe Injury    HPI Monique [name] is a 50 y.o. female who presents the emergency department for evaluation of a nail avulsion to the great toe of the left foot.  Patient was moving a piece of exercise equipment when she accidentally kicked the edge of the equipment.  This caused an avulsion of the toenail.  This is almost completely avulsed.  She was able control the bleeding with direct pressure.  Full range of motion to the toe.  No other injury or complaint.  Last tetanus shot is unknown.       Past Medical History:  Diagnosis Date   Anxiety    Bursitis    leg pain   Carpal tunnel syndrome    Cervical dysplasia    hx LEEP over 18 years ago.    Chronic lymphocytic leukemia ([place]) 2018   Dr Grayland Ormond.  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/09/2020   Bilateral leg pain 11/03/2019   Iron deficiency anemia 10/15/2019   Bilateral arm pain 08/05/2019   Bilateral hand numbness 08/05/2019   Weakness of both hands 08/05/2019   Chronic musculoskeletal pain 07/19/2019   Chronic neuropathic pain 07/19/2019   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   Special screening for malignant neoplasms, colon    Polyp of sigmoid colon    Hemorrhoids     Trigeminal neuralgia 10/05/2018   Lumbar radiculitis (Right) 09/24/2018   Hypocalcemia 06/03/2018   Vitamin D insufficiency 06/03/2018   Abnormal MRI, lumbar spine (06/02/2020) 06/03/2018   Chronic hip pain (Right) 06/03/2018   Spondylosis without myelopathy or radiculopathy, lumbar region 06/03/2018   DDD (degenerative disc disease), lumbar 06/02/2018   Lumbar facet hypertrophy 06/02/2018   Lumbar facet arthropathy 06/02/2018   Lumbar facet syndrome (Bilateral) (R>L) 06/02/2018   Marijuana use 05/19/2018   Chronic lower extremity pain (1ry area of Pain) (Bilateral) (R>L) 05/13/2018   Chronic low back pain (2ry area of Pain) (Bilateral) (R>L) w/ sciatica (Bilateral) 05/13/2018   Chronic pain syndrome 05/13/2018   Disorder of skeletal system 05/13/2018   Pharmacologic therapy 05/13/2018   Problems influencing health status 05/13/2018   Mastalgia 05/05/2018   Breast mass, right 01/07/2018   Face pain 12/11/2017   Tingling 12/11/2017   Thoracic aortic atherosclerosis ([place]) 08/20/2017   Emphysema of lung ([place]) 08/20/2017   Adductor tendinitis 04/23/2017   Trochanteric bursitis of right hip 04/23/2017   GERD without esophagitis 12/11/2016   CLL (chronic lymphocytic leukemia) ([place]) 11/24/2016   Pap smear abnormality of cervix/human papillomavirus (HPV) positive 09/12/2016   Stress incontinence 09/04/2016   B12 deficiency 07/10/2015   Insomnia, persistent 07/10/2015   Anorexia nervosa, restricting type 07/10/2015   Anxiety, generalized 07/10/2015   H/O suicide attempt  07/10/2015   Lymphocytosis 07/10/2015   Obsessive-compulsive disorder 07/10/2015   Tobacco use 07/10/2015   History of cervical dysplasia 07/18/2014    Past Surgical History:  Procedure Laterality Date   BREAST BIOPSY Right 01/05/2018   US guided biopsy of 2 areas and 1 lymph node, MIXED INFLAMMATION AND GIANT CELL REACTION   CERVICAL BIOPSY  W/ LOOP ELECTRODE EXCISION     COLONOSCOPY WITH PROPOFOL N/A 04/21/2019    Procedure: COLONOSCOPY WITH PROPOFOL;  Surgeon: Virgel Manifold, MD;  Location: [facility] ENDOSCOPY;  Service: Gastroenterology;  Laterality: N/A;   OTHER SURGICAL HISTORY     scar tissue removed from vocal cords   TUBAL LIGATION     VAGINAL HYSTERECTOMY N/A 01/22/2021   Procedure: HYSTERECTOMY VAGINAL; BILATERAL SALPINGECTOMY;  Surgeon: Rubie Maid, MD;  Location: [facility] ORS;  Service: Gynecology;  Laterality: N/A;   vocal cord surgery  2005    Prior to Admission medications   Medication Sig Start Date End Date Taking? Authorizing Provider  cephALEXin (KEFLEX) 500 MG capsule Take 2 capsules (1,000 mg total) by mouth 2 (two) times daily. 05/19/21  Yes [place], Charline Bills, PA-C  mupirocin ointment (BACTROBAN) 2 % Apply 1 application topically 2 (two) times daily. On nostrils and also skin lesions 03/20/21   [name], MD  ADVAIR DISKUS 250-50 MCG/DOSE AEPB Inhale 1 puff into the lungs 2 (two) times daily. Patient taking differently: Inhale 1 puff into the lungs in the morning. 12/12/20   [name], MD  Ascorbic Acid (VITAMIN C) 1000 MG tablet Take 1,000 mg by mouth daily as needed (immune health).    [provider]  atorvastatin (LIPITOR) 10 MG tablet Take 1 tablet (10 mg total) by mouth in the morning. 02/22/21   [name], MD  Calcium Carbonate-Vit D-Min (GNP CALCIUM 1200) 1200-1000 MG-UNIT CHEW Chew 1,200 mg by mouth daily with breakfast. Take in combination with vitamin D and magnesium. Patient taking differently: Chew 2 tablets by mouth in the morning, at noon, and at bedtime. Take in combination with vitamin D and magnesium. 08/07/20 08/07/21  [name], MD  Crisaborole (EUCRISA) 2 % OINT Apply 1 application topically 2 (two) times daily. Patient taking differently: Apply 1 application topically 2 (two) times daily. Hands and feet 12/04/20   [name], MD  DULoxetine (CYMBALTA) 60 MG capsule Take 1 capsule (60 mg total) by mouth daily. 03/13/21    [name], MD  estradiol (CLIMARA) 0.06 MG/24HR Place 1 patch onto the skin once a week. 02/01/21   Rubie Maid, MD  ferrous sulfate 324 (65 Fe) MG TBEC Take 1 tablet (325 mg total) by mouth daily. 12/12/20   [name], MD  ibrutinib 420 MG TABS Take 1 tablet (420 mg) by mouth daily. 01/01/21   [name], RPH-CPP  loratadine (CLARITIN) 10 MG tablet Take 1 tablet (10 mg total) by mouth every morning. 12/12/20   [name], MD  Magnesium 500 MG CAPS Take 500 mg by mouth in the morning.    [provider]  meloxicam (MOBIC) 15 MG tablet Take 1 tablet (15 mg total) by mouth daily. 03/29/21 04/28/21  [name], MD  omeprazole (PRILOSEC) 20 MG capsule Take 1 capsule (20 mg total) by mouth daily. Patient taking differently: Take 20 mg by mouth in the morning. 12/12/20   [name], MD  pregabalin (LYRICA) 100 MG capsule Take 100 mg by mouth 3 (three) times daily. 06/05/20   [provider]  RHOFADE 1 % CREA APPLY  A THIN COAT TO THE ENTIRE FACE EVERY MORNING. 05/02/21   [name], MD  Ruxolitinib Phosphate (OPZELURA) 1.5 % CREA Apply 1 application topically daily. May use 1-2 times daily to hands and feet as needed. 01/15/21   [name], MD  sertraline (ZOLOFT) 100 MG tablet TAKE 1 AND 1/2 TABLETS BY MOUTH EVERY MORNING 04/20/21   Rubie Maid, MD  tiZANidine (ZANAFLEX) 2 MG tablet Take 1 tablet (2 mg total) by mouth every 8 (eight) hours as needed for muscle spasms. 03/29/21 04/28/21  [name], MD  vitamin B-12 (CYANOCOBALAMIN) 500 MCG tablet Take 250 mcg by mouth every morning. 02/26/21   [provider]    Allergies Patient has no known allergies.  Family History  Problem Relation Age of Onset   Cancer Mother        thyroid   Alcohol abuse Father    Alcohol abuse Brother    Depression Brother    Bipolar disorder Brother    ADD / ADHD Son    Breast cancer Neg Hx     Social History Social History   Tobacco Use    Smoking status: Every Day    Packs/day: 1.00    Years: 31.00    Pack years: 31.00    Types: Cigarettes    Start date: 09/25/1986   Smokeless tobacco: Never  Vaping Use   Vaping Use: Former  Substance Use Topics   Alcohol use: Not Currently    Alcohol/week: 0.0 standard drinks    Comment: rarely   Drug use: Not Currently    Types: Marijuana     Review of Systems  Constitutional: No fever/chills Eyes: No visual changes. No discharge ENT: No upper respiratory complaints. Cardiovascular: no chest pain. Respiratory: no cough. No SOB. Gastrointestinal: No abdominal pain.  No nausea, no vomiting.  No diarrhea.  No constipation. Musculoskeletal: Toe/toenail injury to the left foot Skin: Negative for rash, abrasions, lacerations, ecchymosis. Neurological: Negative for headaches, focal weakness or numbness.  10 System ROS otherwise negative.  ____________________________________________   PHYSICAL EXAM:  VITAL SIGNS: ED Triage Vitals [05/19/21 1455]  Enc Vitals Group     BP 135/65     Pulse Rate 82     Resp 18     Temp 98.3 F (36.8 C)     Temp Source Oral     SpO2 96 %     Weight 170 lb (77.1 kg)     Height '5\' 7"'$  (1.702 m)     Head Circumference      Peak Flow      Pain Score 10     Pain Loc      Pain Edu?      Excl. in [place]?      Constitutional: Alert and oriented. Well appearing and in no acute distress. Eyes: Conjunctivae are normal. PERRL. EOMI. Head: Atraumatic. ENT:      Ears:       Nose: No congestion/rhinnorhea.      Mouth/Throat: Mucous membranes are moist.  Neck: No stridor.    Cardiovascular: Normal rate, regular rhythm. Normal S1 and S2.  Good peripheral circulation. Respiratory: Normal respiratory effort without tachypnea or retractions. Lungs CTAB. Good air entry to the bases with no decreased or absent breath sounds. Musculoskeletal: Full range of motion to all extremities. No gross deformities appreciated.  Visualization of the left foot  reveals obvious avulsion of the toenail.  There is minimal attachment along the proximal nailbed but the remainder of the  toe has elevated off of the nailbed.  No active bleeding.  No visible foreign body.  Patient is able to flex and extend the toe at this time.  Capillary refill still intact.  Sensation still intact. Neurologic:  Normal speech and language. No gross focal neurologic deficits are appreciated.  Skin:  Skin is warm, dry and intact. No rash noted. Psychiatric: Mood and affect are normal. Speech and behavior are normal. Patient exhibits appropriate insight and judgement.   ____________________________________________   LABS (all labs ordered are listed, but only abnormal results are displayed)  Labs Reviewed - No data to display ____________________________________________  EKG   ____________________________________________  RADIOLOGY I personally viewed and evaluated these images as part of my medical decision making, as well as reviewing the written report by the radiologist.  ED Provider Interpretation: no indication of underlying fracture.  Toenail avulsion is also identified on x-ray.  DG Foot Complete Left  Result Date: 05/19/2021 CLINICAL DATA:  Struck foot on exercise machine, pain at distal phalanx great toe EXAM: LEFT FOOT - COMPLETE 3+ VIEW COMPARISON:  None FINDINGS: Osseous mineralization normal. Jewelry artifacts at ankle. Avulsion injury of great toe nail. Joint spaces preserved. No acute fracture, dislocation, or bone destruction. Small Achilles insertion calcaneal spur. IMPRESSION: No acute osseous abnormalities. Toenail avulsion LEFT great toe. Electronically Signed   By: Lavonia Dana M.D.   On: 05/19/2021 15:56    ____________________________________________    PROCEDURES  Procedure(s) performed:    .Nail Removal  Date/Time: 05/19/2021 5:23 PM Performed by: Darletta Moll, PA-C Authorized by: Darletta Moll, PA-C   Consent:     Consent obtained:  Verbal   Consent given by:  Patient   Risks discussed:  Bleeding, incomplete removal, infection, pain and permanent nail deformity   Alternatives discussed:  No treatment, referral and alternative treatment Universal protocol:    Procedure explained and questions answered to patient or proxy's satisfaction: yes     Immediately prior to procedure, a time out was called: yes     Patient identity confirmed:  Verbally with patient Location:    Foot:  L big toe Pre-procedure details:    Skin preparation:  Povidone-iodine   Preparation: Patient was prepped and draped in the usual sterile fashion   Anesthesia:    Anesthesia method:  Nerve block   Block location:  Great toe   Block needle gauge:  27 G   Block anesthetic:  Lidocaine 1% w/o epi   Block technique:  Digital block   Block injection procedure:  Anatomic landmarks identified, introduced needle, negative aspiration for blood and incremental injection   Block outcome:  Anesthesia achieved Nail Removal:    Nail removed:  Complete   Removed nail replaced and anchored: no   Post-procedure details:    Dressing:  Non-adhesive packing strip, petrolatum-impregnated gauze and gauze roll   Procedure completion:  Tolerated well, no immediate complications Comments:     Visualization of the wound had revealed that majority of toenail had been displaced.  There is a small margin of tissue retaining the toenail.  This is completely excised.  Given the deformity to the nail this was unable to be replaced.  I used packing to elevate the proximal nailbed.  Toe is wrapped.  Wound care instructions discussed with the patient.    Medications  lidocaine (PF) (XYLOCAINE) 1 % injection 10 mL (has no administration in time range)  silver nitrate applicators applicator 3 Stick (has no administration in time range)  Tdap (BOOSTRIX) injection 0.5 mL (has no administration in time range)      ____________________________________________   INITIAL IMPRESSION / ASSESSMENT AND PLAN / ED COURSE  Pertinent labs & imaging results that were available during my care of the patient were reviewed by me and considered in my medical decision making (see chart for details).  Review of the [place] CSRS was performed in accordance of the [place] prior to dispensing any controlled drugs.           Patient's diagnosis is consistent with toenail avulsion.  Patient presented to the emergency department with an injury to the great toe of the left foot.  Patient had obvious avulsion of the nail.  This was minimally intact along the proximal nailbed.  There is deformity of the nail as well.  Toenail was removed as described above.  Given the deformity to the nail this is not reattached.  Proximal nailbed is elevated with dressing.  I discussed prior to the procedure the risk versus benefits of complete removal.  There is a chance the patient will not regrow a toenail given the nature of the injury.  Patient verbalized understanding and would still prefer to have this completely removed.  Thankfully I did not have to use electrical or chemical cautery for bleeding cessation as patient has had no return bleeding.  Area is dressed, wound care instructions discussed with the patient.  Tetanus shot is updated today.  Patiently placed on antibiotics prophylactically.  Return precautions discussed with the patient..  Patient is given ED precautions to return to the ED for any worsening or new symptoms.     ____________________________________________  FINAL CLINICAL IMPRESSION(S) / ED DIAGNOSES  Final diagnoses:  Avulsion of toenail of left foot      NEW MEDICATIONS STARTED DURING THIS VISIT:  ED Discharge Orders          Ordered    cephALEXin (KEFLEX) 500 MG capsule  2 times daily        05/19/21 1731                This chart was dictated using voice recognition software/Dragon.  Despite best efforts to proofread, errors can occur which can change the meaning. Any change was purely unintentional.    Darletta Moll, PA-C 05/19/21 1731    Nance Pear, MD 05/19/21 2018

## 2021-05-19 NOTE — ED Triage Notes (Signed)
Pt from home via POV with c/o left toe pain, she ran into her exercise machine and her toenail on her left first digit is almost off. Bleeding under control

## 2021-05-22 ENCOUNTER — Telehealth: Payer: Self-pay

## 2021-05-22 ENCOUNTER — Other Ambulatory Visit: Payer: Self-pay

## 2021-05-22 ENCOUNTER — Other Ambulatory Visit: Payer: Self-pay | Admitting: Obstetrics and Gynecology

## 2021-05-22 ENCOUNTER — Telehealth: Payer: Self-pay | Admitting: *Deleted

## 2021-05-22 ENCOUNTER — Ambulatory Visit
Admission: RE | Admit: 2021-05-22 | Discharge: 2021-05-22 | Disposition: A | Discharge status: Home / Self Care | Payer: Medicare Other | Source: Ambulatory Visit | Attending: Oncology | Admitting: Oncology

## 2021-05-22 DIAGNOSIS — R590 Localized enlarged lymph nodes: Secondary | ICD-10-CM | POA: Diagnosis not present

## 2021-05-22 DIAGNOSIS — C9110 Chronic lymphocytic leukemia of B-cell type not having achieved remission: Secondary | ICD-10-CM | POA: Diagnosis not present

## 2021-05-22 DIAGNOSIS — R109 Unspecified abdominal pain: Secondary | ICD-10-CM | POA: Diagnosis not present

## 2021-05-22 DIAGNOSIS — R918 Other nonspecific abnormal finding of lung field: Secondary | ICD-10-CM | POA: Diagnosis not present

## 2021-05-22 LAB — POCT I-STAT CREATININE: Creatinine, Ser: 0.9 mg/dL (ref 0.44–1.00)

## 2021-05-22 MED ORDER — IOHEXOL 350 MG/ML SOLN
100.0000 mL | Freq: Once | INTRAVENOUS | Status: AC | PRN
  Administered 2021-05-22: 100 mL via INTRAVENOUS

## 2021-05-22 NOTE — Telephone Encounter (Signed)
Transition Care Management Unsuccessful Follow-up Telephone Call  Date of discharge and from where:  05/19/2021 from [facility]  Attempts:  1st Attempt  Reason for unsuccessful TCM follow-up call:  Unable to leave message

## 2021-05-22 NOTE — Telephone Encounter (Signed)
Haley from [employer] called re; Tizanidine and Mobic which was lasted filled on 03/29/21.  They were asking how we go about getting refills.  I told her the patient would need an appt.    After thinking about this, patient's PCP should have taken over these medications.   Called patient to let her know.   Patient is also inquiring about injections and if PA has come through.  Transferred to [place].

## 2021-05-23 NOTE — Telephone Encounter (Signed)
Please advise. Pt needs a refill until she meets with the new psychiatry. Thanks [employer]

## 2021-05-23 NOTE — Telephone Encounter (Signed)
Spoke to pt. Pt stated that she has not est with anyone at this time. Pt was informed that her PCP has send in a referral for a psychiatry on 03/13/2021 which had send out letters to her on 05/16/2021. Pt was given the name and phone number of the psychiatry to reach out to them.

## 2021-05-24 ENCOUNTER — Other Ambulatory Visit: Payer: Self-pay | Admitting: Obstetrics and Gynecology

## 2021-05-24 ENCOUNTER — Other Ambulatory Visit: Payer: Self-pay

## 2021-05-24 NOTE — Patient Instructions (Signed)
Visit Information  Ms. [name]  - as a part of your Medicaid benefit, you are eligible for care management and care coordination services at no cost or copay.  Please feel free to call me at [phone].  Aida Raider RN, BSN [place]  [facility] Curator - Managed Medicaid High Risk [phone].

## 2021-05-24 NOTE — Patient Outreach (Signed)
Care Coordination  05/24/2021  Monique [name] 1971-03-27 [id number]   Medicaid Managed Care   Unsuccessful Outreach Note  05/24/2021 Name: Monique [name] MRN: [id number] DOB: 1970-12-07  Referred by: [name], MD Reason for referral : High Risk Managed Medicaid (Unsuccessful telephone outreach)   Third unsuccessful telephone outreach was attempted . The patient was referred to the case management team for assistance with care management and care coordination. The patient's primary care provider has been notified of our unsuccessful attempts to make or maintain contact with the patient. The care management team is pleased to engage with this patient at any time in the future should he/she be interested in assistance from the care management team.   Follow Up Plan: We have been unable to make contact with the patient for follow up. The care management team is available to follow up with the patient after provider conversation with the patient regarding recommendation for care management engagement and subsequent re-referral to the care management team.   Aida Raider RN, BSN [place] Management Coordinator - Managed [facility] High Risk [phone].

## 2021-05-24 NOTE — Telephone Encounter (Signed)
Transition Care Management Follow-up Telephone Call Date of discharge and from where: 05/19/2021 [facility] ED How have you been since you were released from the hospital? "Doing better" Any questions or concerns? No  Items Reviewed: Did the pt receive and understand the discharge instructions provided? Yes  Medications obtained and verified? Yes  Other? No  Any new allergies since your discharge? No  Dietary orders reviewed? No Do you have support at home? Yes   H Functional Questionnaire: (I = Independent and D = Dependent) ADLs: I  Bathing/Dressing- I  Meal Prep- I  Eating- I  Maintaining continence- I  Transferring/Ambulation- I  Managing Meds- I  Follow up appointments reviewed:  PCP Hospital f/u appt confirmed? No  - has a routine appointment later this month [facility] f/u appt confirmed? No   Are transportation arrangements needed? No  If their condition worsens, is the pt aware to call PCP or go to the Emergency Dept.? Yes Was the patient provided with contact information for the PCP's office or ED? Yes Was to pt encouraged to call back with questions or concerns? Yes

## 2021-06-02 NOTE — Progress Notes (Deleted)
No-show to procedure appointment  

## 2021-06-05 ENCOUNTER — Ambulatory Visit: Payer: Medicare Other | Attending: Pain Medicine | Admitting: Pain Medicine

## 2021-06-05 DIAGNOSIS — M5417 Radiculopathy, lumbosacral region: Secondary | ICD-10-CM | POA: Insufficient documentation

## 2021-06-05 DIAGNOSIS — M48061 Spinal stenosis, lumbar region without neurogenic claudication: Secondary | ICD-10-CM | POA: Insufficient documentation

## 2021-06-06 ENCOUNTER — Encounter: Payer: Self-pay | Admitting: Oncology

## 2021-06-11 ENCOUNTER — Encounter: Payer: Self-pay | Admitting: Family Medicine

## 2021-06-12 ENCOUNTER — Other Ambulatory Visit (HOSPITAL_COMMUNITY): Payer: Self-pay

## 2021-06-12 NOTE — Progress Notes (Signed)
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 last visit was in October 2021 , she is on oral medication Imbruvica daily. She has an appointment next month    Low back pain and radiculitis/neuropahty : she is on Lyrica, duloxetine, Tizanidine and Meloxicam  per pain management - Dr. Wynona Canes  and neurologist, from now on I will give her the muscle relaxer and nsaid's and requested by Dr. Wynona Canes . Pain is constant and stable around 5/10   She states tingling, numbness, pain comes and goes on both legs and arms, and sometimes stumbles when walking. Current pain is zero    Emphysema: she has been smoking. She has daily cough,  no wheezing but has intermittent sob -usually with activity- she thinks usually when she has hot flashes. She is on Advair once a day and albuterol    Atherosclerosis: she has been taking Atorvastatin and no side effects. Last LDL was 80,. Unchanged    Carpal Tunnel: had NCS done by Dr. 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    MDD: phq 9 is much higher, she has not seen her therapist in a while, taking zoloft ( given by Dr. Marcelline Mates ) and also on Duloxetine ( for pain) , she has lack of motivation, she has an appointment with psychiatrist coming up and will try to resume therapy    Patient Active Problem List   Diagnosis Date Noted   Lumbar foraminal stenosis (L4-5) (Bilateral) (R>L) 06/05/2021   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 06/05/2021   Lumbosacral  radiculopathy/radiculitis at L5 (Bilateral) 06/05/2021   PAD (peripheral artery disease) ([place]) 02/14/2021   CIN II (cervical intraepithelial neoplasia II) 02/01/2021   S/P vaginal hysterectomy 01/22/2021   Acute right hip pain 06/05/2020   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/09/2020   Bilateral leg pain 11/03/2019   Iron deficiency anemia 10/15/2019   Bilateral arm pain 08/05/2019   Bilateral hand numbness 08/05/2019   Weakness of both hands 08/05/2019   Chronic musculoskeletal pain 07/19/2019   Chronic neuropathic pain 07/19/2019   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   Special screening for malignant neoplasms, colon    Polyp of sigmoid colon    Hemorrhoids    Trigeminal neuralgia 10/05/2018   Lumbar radiculitis (Right) 09/24/2018   Hypocalcemia 06/03/2018   Vitamin D insufficiency 06/03/2018   Abnormal MRI, lumbar spine (06/02/2020) 06/03/2018   Chronic hip pain (Right) 06/03/2018   Spondylosis without myelopathy or radiculopathy, lumbar region 06/03/2018   DDD (degenerative disc disease), lumbar 06/02/2018   Lumbar facet hypertrophy 06/02/2018   Lumbar facet arthropathy 06/02/2018   Lumbar facet syndrome (Bilateral) (R>L) 06/02/2018   Marijuana use 05/19/2018   Chronic lower extremity pain (1ry area of Pain) (Bilateral) (R>L) 05/13/2018   Chronic low back pain (2ry area of Pain) (Bilateral) (R>L) w/ sciatica (Bilateral) 05/13/2018   Chronic pain syndrome 05/13/2018   Disorder of skeletal system 05/13/2018   Pharmacologic therapy 05/13/2018   Problems influencing health  status 05/13/2018   Mastalgia 05/05/2018   Breast mass, right 01/07/2018   Face pain 12/11/2017   Tingling 12/11/2017   Thoracic aortic atherosclerosis ([place]) 08/20/2017   Emphysema of lung ([place]) 08/20/2017   Adductor tendinitis 04/23/2017   Trochanteric bursitis of right hip 04/23/2017   GERD without esophagitis 12/11/2016   CLL (chronic lymphocytic leukemia) ([place]) 11/24/2016   Pap smear abnormality  of cervix/human papillomavirus (HPV) positive 09/12/2016   Stress incontinence 09/04/2016   B12 deficiency 07/10/2015   Insomnia, persistent 07/10/2015   Anorexia nervosa, restricting type 07/10/2015   Anxiety, generalized 07/10/2015   H/O suicide attempt 07/10/2015   Lymphocytosis 07/10/2015   Obsessive-compulsive disorder 07/10/2015   Tobacco use 07/10/2015   History of cervical dysplasia 07/18/2014    Past Surgical History:  Procedure Laterality Date   BREAST BIOPSY Right 01/05/2018   US guided biopsy of 2 areas and 1 lymph node, MIXED INFLAMMATION AND GIANT CELL REACTION   CERVICAL BIOPSY  W/ LOOP ELECTRODE EXCISION     COLONOSCOPY WITH PROPOFOL N/A 04/21/2019   Procedure: COLONOSCOPY WITH PROPOFOL;  Surgeon: Virgel Manifold, MD;  Location: [facility];  Service: Gastroenterology;  Laterality: N/A;   OTHER SURGICAL HISTORY     scar tissue removed from vocal cords   TUBAL LIGATION     VAGINAL HYSTERECTOMY N/A 01/22/2021   Procedure: HYSTERECTOMY VAGINAL; BILATERAL SALPINGECTOMY;  Surgeon: Rubie Maid, MD;  Location: [facility] ORS;  Service: Gynecology;  Laterality: N/A;   vocal cord surgery  2005    Family History  Problem Relation Age of Onset   Cancer Mother        thyroid   Alcohol abuse Father    Alcohol abuse Brother    Depression Brother    Bipolar disorder Brother    ADD / ADHD Son    Breast cancer Neg Hx     Social History   Tobacco Use   Smoking status: Every Day    Packs/day: 1.00    Years: 31.00    Pack years: 31.00    Types: Cigarettes    Start date: 09/25/1986   Smokeless tobacco: Never  Substance Use Topics   Alcohol use: Not Currently    Alcohol/week: 0.0 standard drinks    Comment: rarely     Current Outpatient Medications:    ADVAIR DISKUS 250-50 MCG/DOSE AEPB, Inhale 1 puff into the lungs 2 (two) times daily. (Patient taking differently: Inhale 1 puff into the lungs in the morning.), Disp: 180 each, Rfl: 1   Ascorbic Acid (VITAMIN C)  1000 MG tablet, Take 1,000 mg by mouth daily as needed (immune health)., Disp: , Rfl:    atorvastatin (LIPITOR) 10 MG tablet, Take 1 tablet (10 mg total) by mouth in the morning., Disp: 90 tablet, Rfl: 3   Calcium Carbonate-Vit D-Min (GNP CALCIUM 1200) 1200-1000 MG-UNIT CHEW, Chew 1,200 mg by mouth daily with breakfast. Take in combination with vitamin D and magnesium. (Patient taking differently: Chew 2 tablets by mouth in the morning, at noon, and at bedtime. Take in combination with vitamin D and magnesium.), Disp: 90 tablet, Rfl: 3   cephALEXin (KEFLEX) 500 MG capsule, Take 2 capsules (1,000 mg total) by mouth 2 (two) times daily., Disp: 28 capsule, Rfl: 0   DULoxetine (CYMBALTA) 60 MG capsule, Take 1 capsule (60 mg total) by mouth daily., Disp: 90 capsule, Rfl: 0   ferrous sulfate 324 (65 Fe) MG TBEC, Take 1 tablet (325 mg total) by mouth daily., Disp: 90 tablet, Rfl: 1  ibrutinib 420 MG TABS, Take 1 tablet (420 mg) by mouth daily., Disp: 28 tablet, Rfl: 5   loratadine (CLARITIN) 10 MG tablet, Take 1 tablet (10 mg total) by mouth every morning., Disp: 90 tablet, Rfl: 3   Magnesium 500 MG CAPS, Take 500 mg by mouth in the morning., Disp: , Rfl:    omeprazole (PRILOSEC) 20 MG capsule, Take 1 capsule (20 mg total) by mouth daily. (Patient taking differently: Take 20 mg by mouth in the morning.), Disp: 90 capsule, Rfl: 1   pregabalin (LYRICA) 100 MG capsule, Take 100 mg by mouth 3 (three) times daily., Disp: , Rfl:    RHOFADE 1 % CREA, APPLY A THIN COAT TO THE ENTIRE FACE EVERY MORNING., Disp: 30 g, Rfl: 3   sertraline (ZOLOFT) 100 MG tablet, TAKE 1 AND 1/2 TABLETS BY MOUTH EVERY MORNING, Disp: 135 tablet, Rfl: 0   vitamin B-12 (CYANOCOBALAMIN) 500 MCG tablet, Take 250 mcg by mouth every morning., Disp: , Rfl:    Crisaborole (EUCRISA) 2 % OINT, Apply 1 application topically 2 (two) times daily. (Patient not taking: Reported on 06/13/2021), Disp: 60 g, Rfl: 3   estradiol (CLIMARA) 0.06 MG/24HR, Place  1 patch onto the skin once a week. (Patient not taking: Reported on 06/13/2021), Disp: 4 patch, Rfl: 12   Ruxolitinib Phosphate (OPZELURA) 1.5 % CREA, Apply 1 application topically daily. May use 1-2 times daily to hands and feet as needed. (Patient not taking: Reported on 06/13/2021), Disp: 60 g, Rfl: 2   tiZANidine (ZANAFLEX) 2 MG tablet, Take 1 tablet (2 mg total) by mouth every 8 (eight) hours as needed for muscle spasms., Disp: 90 tablet, Rfl: 0  No Known Allergies  I personally reviewed active problem list, medication list, allergies, family history, social history, health maintenance with the patient/caregiver today.   ROS  Constitutional: Negative for fever or weight change.  Respiratory: Negative for cough and shortness of breath.   Cardiovascular: Negative for chest pain or palpitations.  Gastrointestinal: Negative for abdominal pain, no bowel changes.  Musculoskeletal: Negative for gait problem or joint swelling.  Skin: Negative for rash.  Neurological: Negative for dizziness or headache.  No other specific complaints in a complete review of systems (except as listed in HPI above).   Objective  Vitals:   06/13/21 1037  BP: 112/66  Pulse: 89  Resp: 16  Temp: 98.2 F (36.8 C)  SpO2: 96%  Weight: 181 lb (82.1 kg)  Height: 5\' 7"  (1.702 m)    Body mass index is 28.35 kg/m.  Physical Exam  Constitutional: Patient appears well-developed and well-nourished. No distress.  HEENT: head atraumatic, normocephalic, pupils equal and reactive to light,  neck supple Cardiovascular: Normal rate, regular rhythm and normal heart sounds.  No murmur heard. No BLE edema. Pulmonary/Chest: Effort normal and breath sounds normal. No respiratory distress. Abdominal: Soft.  There is no tenderness. Psychiatric: Patient has a normal mood and affect. behavior is normal. Judgment and thought content normal.   Recent Results (from the past 2160 hour(s))  Iron and TIBC     Status: Abnormal    Collection Time: 04/12/21  3:13 PM  Result Value Ref Range   Iron 33 28 - 170 ug/dL   TIBC 407 250 - 450 ug/dL   Saturation Ratios 8 (L) 10.4 - 31.8 %   UIBC 374 ug/dL    Comment: Performed at [facility], [address] [place], [place] [address]  Ferritin     Status: Abnormal  Collection Time: 04/12/21  3:13 PM  Result Value Ref Range   Ferritin 6 (L) 11 - 307 ng/mL    Comment: Performed at [facility], [address] [place], [place] [address]  Vitamin B12     Status: None   Collection Time: 04/12/21  3:13 PM  Result Value Ref Range   Vitamin B-12 678 180 - 914 pg/mL    Comment: (NOTE) This assay is not validated for testing neonatal or myeloproliferative syndrome specimens for Vitamin B12 levels. Performed at [facility], [place] [address], [place], [place] [address]   Comprehensive metabolic panel     Status: Abnormal   Collection Time: 04/12/21  3:13 PM  Result Value Ref Range   Sodium 135 135 - 145 mmol/L   Potassium 3.9 3.5 - 5.1 mmol/L   Chloride 101 98 - 111 mmol/L   CO2 26 22 - 32 mmol/L   Glucose, Bld 89 70 - 99 mg/dL    Comment: Glucose reference range applies only to samples taken after fasting for at least 8 hours.   BUN 18 6 - 20 mg/dL   Creatinine, Ser 0.85 0.44 - 1.00 mg/dL   Calcium 8.6 (L) 8.9 - 10.3 mg/dL   Total Protein 6.4 (L) 6.5 - 8.1 g/dL   Albumin 3.7 3.5 - 5.0 g/dL   AST 15 15 - 41 U/L   ALT 10 0 - 44 U/L   Alkaline Phosphatase 78 38 - 126 U/L   Total Bilirubin 0.5 0.3 - 1.2 mg/dL   GFR, Estimated >60 >60 mL/min    Comment: (NOTE) Calculated using the CKD-EPI Creatinine Equation (2021)    Anion gap 8 5 - 15    Comment: Performed at [facility], [address] [place],  [address]  CBC     Status: Abnormal   Collection Time: 04/12/21  3:13 PM  Result Value Ref Range   WBC 15.0 (H) 4.0 - 10.5 K/uL   RBC 4.46 3.87 - 5.11 MIL/uL   Hemoglobin 13.7 12.0 - 15.0 g/dL   HCT 42.1 36.0 - 46.0 %   MCV  94.4 80.0 - 100.0 fL   MCH 30.7 26.0 - 34.0 pg   MCHC 32.5 30.0 - 36.0 g/dL   RDW 13.1 11.5 - 15.5 %   Platelets 190 150 - 400 K/uL   nRBC 0.0 0.0 - 0.2 %    Comment: Performed at [facility], [address] [place],  [address]  I-STAT creatinine     Status: None   Collection Time: 05/22/21  3:03 PM  Result Value Ref Range   Creatinine, Ser 0.90 0.44 - 1.00 mg/dL     PHQ2/9: Depression screen [facility] 2/9 06/13/2021 03/13/2021 01/04/2021 12/27/2020 12/06/2020  Decreased Interest 3 1 2 3 3   Down, Depressed, Hopeless 0 0 2 2 3   PHQ - 2 Score 3 1 4 5 6   Altered sleeping 0 0 2 2 3   Tired, decreased energy 3 3 2 3 3   Change in appetite 3 0 2 2 1   Feeling bad or failure about yourself  1 0 0 0 0  Trouble concentrating 3 0 1 1 0  Moving slowly or fidgety/restless 0 0 0 0 0  Suicidal thoughts 0 0 0 0 0  PHQ-9 Score 13 4 11 13 13   Difficult doing work/chores - - Somewhat difficult Somewhat difficult -  Some recent data might be hidden    phq 9 is positive   Fall Risk:  Fall Risk  06/13/2021 03/13/2021 02/28/2021 12/27/2020 12/06/2020  Falls in the past year? 0 1 0 1 1  Number falls in past yr: 0 1 0 1 1  Injury with Fall? 0 0 0 0 0  Risk for fall due to : No Fall Risks - - History of fall(s) -  Follow up Falls prevention discussed - - Falls evaluation completed -      Functional Status Survey: Is the patient deaf or have difficulty hearing?: No Does the patient have difficulty seeing, even when wearing glasses/contacts?: No Does the patient have difficulty concentrating, remembering, or making decisions?: Yes Does the patient have difficulty walking or climbing stairs?: No Does the patient have difficulty dressing or bathing?: No Does the patient have difficulty doing errands alone such as visiting a doctor's office or shopping?: No    Assessment & Plan  1. Need for immunization against influenza  - Flu Vaccine QUAD 49mo+IM (Fluarix, Fluzone & Alfiuria Quad PF)  2.  Anxiety  - DULoxetine (CYMBALTA) 60 MG capsule; Take 1 capsule (60 mg total) by mouth daily.  Dispense: 90 capsule; Refill: 0  3. Mild episode of recurrent major depressive disorder (HCC)  - DULoxetine (CYMBALTA) 60 MG capsule; Take 1 capsule (60 mg total) by mouth daily.  Dispense: 90 capsule; Refill: 0  4. GERD without esophagitis  - omeprazole (PRILOSEC) 20 MG capsule; Take 1 capsule (20 mg total) by mouth in the morning.  Dispense: 90 capsule; Refill: 1  5. Chronic low back pain (Secondary Area of Pain) (Bilateral) (R>L) w/ sciatica (Bilateral)  - tiZANidine (ZANAFLEX) 2 MG tablet; Take 1 tablet (2 mg total) by mouth every 8 (eight) hours as needed for muscle spasms.  Dispense: 90 tablet; Refill: 1  6. Chronic musculoskeletal pain  - tiZANidine (ZANAFLEX) 2 MG tablet; Take 1 tablet (2 mg total) by mouth every 8 (eight) hours as needed for muscle spasms.  Dispense: 90 tablet; Refill: 1    7. B12 deficiency   8. CLL (chronic lymphocytic leukemia) ([place])  Keep follow up with [name]   9. Vitamin D deficiency   10. Thoracic aortic atherosclerosis ([place])  On statin therapy   11. Chronic neuropathic pain

## 2021-06-13 ENCOUNTER — Other Ambulatory Visit: Payer: Self-pay

## 2021-06-13 ENCOUNTER — Encounter: Payer: Self-pay | Admitting: Family Medicine

## 2021-06-13 ENCOUNTER — Ambulatory Visit (INDEPENDENT_AMBULATORY_CARE_PROVIDER_SITE_OTHER): Payer: Medicare Other | Admitting: Family Medicine

## 2021-06-13 ENCOUNTER — Telehealth: Payer: Self-pay | Admitting: Obstetrics and Gynecology

## 2021-06-13 VITALS — BP 112/66 | HR 89 | Temp 98.2°F | Resp 16 | Ht 67.0 in | Wt 181.0 lb

## 2021-06-13 DIAGNOSIS — M5441 Lumbago with sciatica, right side: Secondary | ICD-10-CM

## 2021-06-13 DIAGNOSIS — M7918 Myalgia, other site: Secondary | ICD-10-CM

## 2021-06-13 DIAGNOSIS — E538 Deficiency of other specified B group vitamins: Secondary | ICD-10-CM

## 2021-06-13 DIAGNOSIS — M792 Neuralgia and neuritis, unspecified: Secondary | ICD-10-CM

## 2021-06-13 DIAGNOSIS — F330 Major depressive disorder, recurrent, mild: Secondary | ICD-10-CM

## 2021-06-13 DIAGNOSIS — E559 Vitamin D deficiency, unspecified: Secondary | ICD-10-CM

## 2021-06-13 DIAGNOSIS — M5442 Lumbago with sciatica, left side: Secondary | ICD-10-CM | POA: Diagnosis not present

## 2021-06-13 DIAGNOSIS — I700 Atherosclerosis of aorta: Secondary | ICD-10-CM | POA: Diagnosis not present

## 2021-06-13 DIAGNOSIS — G8929 Other chronic pain: Secondary | ICD-10-CM | POA: Diagnosis not present

## 2021-06-13 DIAGNOSIS — C9110 Chronic lymphocytic leukemia of B-cell type not having achieved remission: Secondary | ICD-10-CM | POA: Diagnosis not present

## 2021-06-13 DIAGNOSIS — F419 Anxiety disorder, unspecified: Secondary | ICD-10-CM

## 2021-06-13 DIAGNOSIS — Z23 Encounter for immunization: Secondary | ICD-10-CM

## 2021-06-13 DIAGNOSIS — K219 Gastro-esophageal reflux disease without esophagitis: Secondary | ICD-10-CM | POA: Diagnosis not present

## 2021-06-13 MED ORDER — DULOXETINE HCL 60 MG PO CPEP: 60.0000 mg | ORAL_CAPSULE | Freq: Every day | ORAL | 0 refills | Status: DC

## 2021-06-13 MED ORDER — MELOXICAM 15 MG PO TABS: 15.0000 mg | ORAL_TABLET | Freq: Every day | ORAL | 1 refills | Status: DC

## 2021-06-13 MED ORDER — OMEPRAZOLE 20 MG PO CPDR: 20.0000 mg | DELAYED_RELEASE_CAPSULE | Freq: Every morning | ORAL | 1 refills | Status: DC

## 2021-06-13 MED ORDER — TIZANIDINE HCL 2 MG PO TABS: 2.0000 mg | ORAL_TABLET | Freq: Three times a day (TID) | ORAL | 1 refills | Status: DC | PRN

## 2021-06-13 MED ORDER — FLUTICASONE-SALMETEROL 250-50 MCG/ACT IN AEPB: 1.0000 | INHALATION_SPRAY | Freq: Two times a day (BID) | RESPIRATORY_TRACT | 5 refills | Status: DC

## 2021-06-13 NOTE — Telephone Encounter (Signed)
Patient called in and states for 3 weeks now she has been experiencing pain in her vaginal and stomach areas.  Patient states "when I had sex, which was only one time, it hurt.  He isn't real big but I don't know if he hit something or what but I all of a sudden felt a sharp pain"  Patient states she went to her PCP since she had an appointment anyway and was told she needed to contact us since she had her surgery with [place].  Please advise.

## 2021-06-14 NOTE — Telephone Encounter (Signed)
Please advise. Thanks Zanden Colver 

## 2021-06-18 NOTE — Telephone Encounter (Signed)
Called patient twice.  Called on Friday and called this morning.  Unable to leave voicemail due to mailbox not being set up, so I sent a message via mychart.

## 2021-06-18 NOTE — Progress Notes (Signed)
-PROVIDER NOTE: Information contained herein reflects review and annotations entered in association with encounter. Interpretation of such information and data should be left to medically-trained personnel. Information provided to patient can be located elsewhere in the medical record under "Patient Instructions". Document created using STT-dictation technology, any transcriptional errors that may result from process are unintentional.    Patient: Monique [name]  Service Category: Procedure  Provider: Gaspar Cola, MD  DOB: 1971/07/21  DOS: 06/19/2021  Location: [facility]  MRN: [id number]  Setting: Ambulatory - outpatient  Referring Provider: [name], MD  Type: Established Patient  Specialty: Interventional Pain Management  PCP: [name], MD   Primary Reason for Visit: Interventional Pain Management Treatment. CC: Leg Pain (bilateral)    Procedure:          Anesthesia, Analgesia, Anxiolysis:  Type: Trans-Foraminal Epidural Steroid Injection          Purpose: Diagnostic/Therapeutic Region: Posterolateral Lumbosacral Target Area: The 6 o'clock position under the pedicle, on the affected side. Approach: Posterior Percutaneous Paravertebral approach. Level: L4 Level Laterality: Bilateral       Type: Local Anesthesia Local Anesthetic: Lidocaine 1-2% Sedation: Minimal Anxiolysis  Indication(s): Anxiety & Analgesia Route: Infiltration (Pavo/IM) IV Access: Available   Position: Prone   Indications: 1. 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 Lumbar radiculitis (Right)   5. Lumbosacral radiculopathy/radiculitis at L5 (Bilateral)   6. DDD (degenerative disc disease), lumbar   7. Chronic lower extremity pain (1ry area of Pain) (Bilateral) (R>L)   8. Chronic low back pain (2ry area of Pain) (Bilateral) (R>L) w/ sciatica (Bilateral)   9.  Chronic hip pain (Right)   10. Abnormal MRI, lumbar spine (06/02/2020)    Pain Score: Pre-procedure: 5 /10 Post-procedure: 0-No pain/10     Pre-op H&P Assessment:  Monique [name] is a 50 y.o. (year old), female patient, seen today for interventional treatment. She  has a past surgical history that includes Cervical biopsy w/ loop electrode excision; Tubal ligation; OTHER SURGICAL HISTORY; Colonoscopy with propofol (N/A, 04/21/2019); Breast biopsy (Right, 01/05/2018); vocal cord surgery (2005); and Vaginal hysterectomy (N/A, 01/22/2021). Monique [name] has a current medication list which includes the following prescription(s): vitamin c, atorvastatin, gnp calcium 1200, duloxetine, estradiol, ferrous sulfate, fluticasone-salmeterol, ibrutinib, loratadine, magnesium, meloxicam, omeprazole, pregabalin, rhofade, sertraline, tizanidine, and vitamin b-12. Her primarily concern today is the Leg Pain (bilateral)  Initial Vital Signs:  Pulse/HCG Rate: 96ECG Heart Rate: 86 Temp: 97.9 F (36.6 C) Resp: 18 BP: (!) 106/53 SpO2: 100 %  BMI: Estimated body mass index is 28.19 kg/m as calculated from the following:   Height as of this encounter: 5\' 7"  (1.702 m).   Weight as of this encounter: 180 lb (81.6 kg).  Risk Assessment: Allergies: Reviewed. She has No Known Allergies.  Allergy Precautions: None required Coagulopathies: Reviewed. None identified.  Blood-thinner therapy: None at this time Active Infection(s): Reviewed. None identified. Monique [name] is afebrile  Site Confirmation: Monique [name] was asked to confirm the procedure and laterality before marking the site Procedure checklist: Completed Consent: Before the procedure and under the influence of no sedative(s), amnesic(s), or anxiolytics, the patient was informed of the treatment options, risks and possible complications. To fulfill our ethical and legal obligations, as recommended by the American Medical Association's Code of Ethics, I have informed the  patient of my clinical impression; the nature and purpose  of the treatment or procedure; the risks, benefits, and possible complications of the intervention; the alternatives, including doing nothing; the risk(s) and benefit(s) of the alternative treatment(s) or procedure(s); and the risk(s) and benefit(s) of doing nothing. The patient was provided information about the general risks and possible complications associated with the procedure. These may include, but are not limited to: failure to achieve desired goals, infection, bleeding, organ or nerve damage, allergic reactions, paralysis, and death. In addition, the patient was informed of those risks and complications associated to Spine-related procedures, such as failure to decrease pain; infection (i.e.: Meningitis, epidural or intraspinal abscess); bleeding (i.e.: epidural hematoma, subarachnoid hemorrhage, or any other type of intraspinal or peri-dural bleeding); organ or nerve damage (i.e.: Any type of peripheral nerve, nerve root, or spinal cord injury) with subsequent damage to sensory, motor, and/or autonomic systems, resulting in permanent pain, numbness, and/or weakness of one or several areas of the body; allergic reactions; (i.e.: anaphylactic reaction); and/or death. Furthermore, the patient was informed of those risks and complications associated with the medications. These include, but are not limited to: allergic reactions (i.e.: anaphylactic or anaphylactoid reaction(s)); adrenal axis suppression; blood sugar elevation that in diabetics may result in ketoacidosis or comma; water retention that in patients with history of congestive heart failure may result in shortness of breath, pulmonary edema, and decompensation with resultant heart failure; weight gain; swelling or edema; medication-induced neural toxicity; particulate matter embolism and blood vessel occlusion with resultant organ, and/or nervous system infarction; and/or aseptic necrosis  of one or more joints. Finally, the patient was informed that Medicine is not an exact science; therefore, there is also the possibility of unforeseen or unpredictable risks and/or possible complications that may result in a catastrophic outcome. The patient indicated having understood very clearly. We have given the patient no guarantees and we have made no promises. Enough time was given to the patient to ask questions, all of which were answered to the patient's satisfaction. Ms. Printup has indicated that she wanted to continue with the procedure. Attestation: I, the ordering provider, attest that I have discussed with the patient the benefits, risks, side-effects, alternatives, likelihood of achieving goals, and potential problems during recovery for the procedure that I have provided informed consent. Date  Time: 06/19/2021  8:55 AM  Pre-Procedure Preparation:  Monitoring: As per clinic protocol. Respiration, ETCO2, SpO2, BP, heart rate and rhythm monitor placed and checked for adequate function Safety Precautions: Patient was assessed for positional comfort and pressure points before starting the procedure. Time-out: I initiated and conducted the "Time-out" before starting the procedure, as per protocol. The patient was asked to participate by confirming the accuracy of the "Time Out" information. Verification of the correct person, site, and procedure were performed and confirmed by me, the nursing staff, and the patient. "Time-out" conducted as per Joint Commission's Universal Protocol (UP.01.01.01). Time: 0937  Description of Procedure:          Area Prepped: Entire Posterior Lumbosacral Area DuraPrep (Iodine Povacrylex [0.7% available iodine] and Isopropyl Alcohol, 74% w/w) Safety Precautions: Aspiration looking for blood return was conducted prior to all injections. At no point did we inject any substances, as a needle was being advanced. No attempts were made at seeking any paresthesias.  Safe injection practices and needle disposal techniques used. Medications properly checked for expiration dates. SDV (single dose vial) medications used. Description of the Procedure: Protocol guidelines were followed. The patient was placed in position over the procedure table. The target area was  identified and the area prepped in the usual manner. Skin & deeper tissues infiltrated with local anesthetic. Appropriate amount of time allowed to pass for local anesthetics to take effect. The procedure needles were then advanced to the target area. Proper needle placement secured. Negative aspiration confirmed. Solution injected in intermittent fashion, asking for systemic symptoms every 0.5cc of injectate. The needles were then removed and the area cleansed, making sure to leave some of the prepping solution back to take advantage of its long term bactericidal properties.  Vitals:   06/19/21 0948 06/19/21 0958 06/19/21 1008 06/19/21 1018  BP: (!) 101/51 119/90 121/89 119/89  Pulse: 84     Resp: (!) 23 16 15 15   Temp:  (!) 97.4 F (36.3 C)  (!) 97.2 F (36.2 C)  TempSrc:      SpO2: 99% 99% 98% 99%  Weight:      Height:        Start Time: 0937 hrs. End Time: 0946 hrs.  Materials:  Needle(s) Type: Spinal Needle Gauge: 22G Length: 5-in Medication(s): Please see orders for medications and dosing details.  Imaging Guidance (Spinal):          Type of Imaging Technique: Fluoroscopy Guidance (Spinal) Indication(s): Assistance in needle guidance and placement for procedures requiring needle placement in or near specific anatomical locations not easily accessible without such assistance. Exposure Time: Please see nurses notes. Contrast: Before injecting any contrast, we confirmed that the patient did not have an allergy to iodine, shellfish, or radiological contrast. Once satisfactory needle placement was completed at the desired level, radiological contrast was injected. Contrast injected under  live fluoroscopy. No contrast complications. See chart for type and volume of contrast used. Fluoroscopic Guidance: I was personally present during the use of fluoroscopy. "Tunnel Vision Technique" used to obtain the best possible view of the target area. Parallax error corrected before commencing the procedure. "Direction-depth-direction" technique used to introduce the needle under continuous pulsed fluoroscopy. Once target was reached, antero-posterior, oblique, and lateral fluoroscopic projection used confirm needle placement in all planes. Images permanently stored in EMR. Interpretation: I personally interpreted the imaging intraoperatively. Adequate needle placement confirmed in multiple planes. Appropriate spread of contrast into desired area was observed. No evidence of afferent or efferent intravascular uptake. No intrathecal or subarachnoid spread observed. Permanent images saved into the patient's record.  Antibiotic Prophylaxis:   Anti-infectives (From admission, onward)    None      Indication(s): None identified  Post-operative Assessment:  Post-procedure Vital Signs:  Pulse/HCG Rate: 84 (NSR)89 Temp: (!) 97.2 F (36.2 C) Resp: 15 BP: 119/89 SpO2: 99 %  EBL: None  Complications: No immediate post-treatment complications observed by team, or reported by patient.  Note: The patient tolerated the entire procedure well. A repeat set of vitals were taken after the procedure and the patient was kept under observation following institutional policy, for this type of procedure. Post-procedural neurological assessment was performed, showing return to baseline, prior to discharge. The patient was provided with post-procedure discharge instructions, including a section on how to identify potential problems. Should any problems arise concerning this procedure, the patient was given instructions to immediately contact us, at any time, without hesitation. In any case, we plan to contact  the patient by telephone for a follow-up status report regarding this interventional procedure.  Comments:  No additional relevant information.  Plan of Care  Orders:  Orders Placed This Encounter  Procedures   Lumbar Transforaminal Epidural    Scheduling Instructions:  Side: Bilateral     Level: L4     Sedation: Patient's choice.     Timeframe: Today    Order Specific Question:   Where will this procedure be performed?    Answer:   [facility]   DG [facility] C-ARM 1-60 MIN NO REPORT    Intraoperative interpretation by procedural physician at [facility].    Standing Status:   Standing    Number of Occurrences:   1    Order Specific Question:   Reason for exam:    Answer:   Assistance in needle guidance and placement for procedures requiring needle placement in or near specific anatomical locations not easily accessible without such assistance.   Informed Consent Details: Physician/Practitioner Attestation; Transcribe to consent form and obtain patient signature    Provider Attestation: I, [place] Dossie Arbour, MD, (Pain Management Specialist), the physician/practitioner, attest that I have discussed with the patient the benefits, risks, side effects, alternatives, likelihood of achieving goals and potential problems during recovery for the procedure that I have provided informed consent.    Scheduling Instructions:     Note: Always confirm laterality of pain with Ms. Tamala Julian, before procedure.     Transcribe to consent form and obtain patient signature.    Order Specific Question:   Physician/Practitioner attestation of informed consent for procedure/surgical case    Answer:   I, the physician/practitioner, attest that I have discussed with the patient the benefits, risks, side effects, alternatives, likelihood of achieving goals and potential problems during recovery for the procedure that I have provided informed consent.    Order Specific Question:   Procedure     Answer:   Diagnostic lumbar transforaminal epidural steroid injection under fluoroscopic guidance. (See notes for level and laterality.)    Order Specific Question:   Physician/Practitioner performing the procedure    Answer:   Oshay Stranahan A. Dossie Arbour, MD    Order Specific Question:   Indication/Reason    Answer:   Lumbar radiculopathy/radiculitis associated with lumbar stenosis   Provide equipment / supplies at bedside    "Block Tray" (Disposable  single use) Needle type: SpinalSpinal Amount/quantity: 2 Size: Medium (5-inch) Gauge: 22G    Standing Status:   Standing    Number of Occurrences:   1    Order Specific Question:   Specify    Answer:   Block Tray    Chronic Opioid Analgesic:  No opioid analgesics prescribed by our practice. See 05/13/2018 UDS (+) Undeclared THC.   Medications ordered for procedure: Meds ordered this encounter  Medications   iohexol (OMNIPAQUE) 180 MG/ML injection 10 mL    Must be Myelogram-compatible. If not available, you may substitute with a water-soluble, non-ionic, hypoallergenic, myelogram-compatible radiological contrast medium.   lidocaine (XYLOCAINE) 2 % (with pres) injection 400 mg   pentafluoroprop-tetrafluoroeth (GEBAUERS) aerosol   lactated ringers infusion 1,000 mL   midazolam (VERSED) 5 MG/5ML injection 0.5-2 mg    Make sure Flumazenil is available in the pyxis when using this medication. If oversedation occurs, administer 0.2 mg IV over 15 sec. If after 45 sec no response, administer 0.2 mg again over 1 min; may repeat at 1 min intervals; not to exceed 4 doses (1 mg)   sodium chloride flush (NS) 0.9 % injection 2 mL   ropivacaine (PF) 2 mg/mL (0.2%) (NAROPIN) injection 2 mL   dexamethasone (DECADRON) injection 20 mg    Medications administered: We administered iohexol, lidocaine, pentafluoroprop-tetrafluoroeth, lactated ringers, midazolam, sodium chloride flush, ropivacaine (  PF) 2 mg/mL (0.2%), and dexamethasone.  See the medical  record for exact dosing, route, and time of administration.  Follow-up plan:   Return in about 2 weeks (around 07/03/2021) for Proc-day (T,Th), (F2F), (PPE).       Interventional management options: Planned, scheduled, and/or pending:   Procedure (w/ sedation): (L) L4-5 LESI + (B) L5 TFESI.    Considering:   Diagnostic/therapeutic right L3-4 LESI #3   Diagnostic/therapeutic right L4 TFESI #3  Diagnostic bilateral lumbar facet nerve block #1  Possible bilateral lumbar facet RFA    Palliative PRN treatment(s):   Palliative right L4 TFESI + right L3-4 LESI (IV sedation preferred)     Recent Visits Date Type Provider Dept  03/29/21 Telemedicine [name], MD [facility]  Showing recent visits within past 90 days and meeting all other requirements Today's Visits Date Type Provider Dept  06/19/21 Procedure visit [name], MD [facility]  Showing today's visits and meeting all other requirements Future Appointments Date Type Provider Dept  07/03/21 Appointment [name], MD [facility]  Showing future appointments within next 90 days and meeting all other requirements Disposition: Discharge home  Discharge (Date  Time): 06/19/2021; 1024 hrs.   Primary Care Physician: [name], MD Location: [facility] Note by: Gaspar Cola, MD Date: 06/19/2021; Time: 1:27 PM  Disclaimer:  Medicine is not an Chief Strategy Officer. The only guarantee in medicine is that nothing is guaranteed. It is important to note that the decision to proceed with this intervention was based on the information collected from the patient. The Data and conclusions were drawn from the patient's questionnaire, the interview, and the physical examination. Because the information was provided in large part by the patient, it cannot be guaranteed that it has not been purposely or unconsciously manipulated. Every effort has been made  to obtain as much relevant data as possible for this evaluation. It is important to note that the conclusions that lead to this procedure are derived in large part from the available data. Always take into account that the treatment will also be dependent on availability of resources and existing treatment guidelines, considered by other Pain Management Practitioners as being common knowledge and practice, at the time of the intervention. For Medico-Legal purposes, it is also important to point out that variation in procedural techniques and pharmacological choices are the acceptable norm. The indications, contraindications, technique, and results of the above procedure should only be interpreted and judged by a Board-Certified Interventional Pain Specialist with extensive familiarity and expertise in the same exact procedure and technique.

## 2021-06-19 ENCOUNTER — Ambulatory Visit (HOSPITAL_BASED_OUTPATIENT_CLINIC_OR_DEPARTMENT_OTHER): Payer: Medicare Other | Admitting: Pain Medicine

## 2021-06-19 ENCOUNTER — Encounter: Payer: Self-pay | Admitting: Pain Medicine

## 2021-06-19 ENCOUNTER — Ambulatory Visit
Admission: RE | Admit: 2021-06-19 | Discharge: 2021-06-19 | Disposition: A | Discharge status: Home / Self Care | Payer: Medicare Other | Source: Ambulatory Visit | Attending: Pain Medicine | Admitting: Pain Medicine

## 2021-06-19 ENCOUNTER — Other Ambulatory Visit: Payer: Self-pay

## 2021-06-19 VITALS — BP 119/89 | HR 84 | Temp 97.2°F | Resp 15 | Ht 67.0 in | Wt 180.0 lb

## 2021-06-19 DIAGNOSIS — M5442 Lumbago with sciatica, left side: Secondary | ICD-10-CM

## 2021-06-19 DIAGNOSIS — M5126 Other intervertebral disc displacement, lumbar region: Secondary | ICD-10-CM | POA: Diagnosis not present

## 2021-06-19 DIAGNOSIS — M5417 Radiculopathy, lumbosacral region: Secondary | ICD-10-CM | POA: Diagnosis not present

## 2021-06-19 DIAGNOSIS — M5136 Other intervertebral disc degeneration, lumbar region: Secondary | ICD-10-CM | POA: Diagnosis not present

## 2021-06-19 DIAGNOSIS — M48061 Spinal stenosis, lumbar region without neurogenic claudication: Secondary | ICD-10-CM | POA: Diagnosis not present

## 2021-06-19 DIAGNOSIS — G8929 Other chronic pain: Secondary | ICD-10-CM

## 2021-06-19 DIAGNOSIS — M79604 Pain in right leg: Secondary | ICD-10-CM | POA: Insufficient documentation

## 2021-06-19 DIAGNOSIS — R937 Abnormal findings on diagnostic imaging of other parts of musculoskeletal system: Secondary | ICD-10-CM | POA: Diagnosis not present

## 2021-06-19 DIAGNOSIS — M25551 Pain in right hip: Secondary | ICD-10-CM | POA: Diagnosis not present

## 2021-06-19 DIAGNOSIS — M79605 Pain in left leg: Secondary | ICD-10-CM | POA: Diagnosis not present

## 2021-06-19 DIAGNOSIS — M5441 Lumbago with sciatica, right side: Secondary | ICD-10-CM | POA: Insufficient documentation

## 2021-06-19 DIAGNOSIS — M5416 Radiculopathy, lumbar region: Secondary | ICD-10-CM | POA: Diagnosis not present

## 2021-06-19 MED ORDER — MIDAZOLAM HCL 5 MG/5ML IJ SOLN
0.5000 mg | Freq: Once | INTRAMUSCULAR | Status: AC
  Administered 2021-06-19: 2 mg via INTRAVENOUS

## 2021-06-19 MED ORDER — ROPIVACAINE HCL 2 MG/ML IJ SOLN
2.0000 mL | Freq: Once | INTRAMUSCULAR | Status: AC
  Administered 2021-06-19: 2 mL via EPIDURAL

## 2021-06-19 MED ORDER — MIDAZOLAM HCL 5 MG/5ML IJ SOLN
INTRAMUSCULAR | Status: AC
  Filled 2021-06-19: qty 5

## 2021-06-19 MED ORDER — SODIUM CHLORIDE (PF) 0.9 % IJ SOLN
INTRAMUSCULAR | Status: AC
  Filled 2021-06-19: qty 10

## 2021-06-19 MED ORDER — LIDOCAINE HCL 2 % IJ SOLN
INTRAMUSCULAR | Status: AC
  Filled 2021-06-19: qty 20

## 2021-06-19 MED ORDER — LACTATED RINGERS IV SOLN
1000.0000 mL | Freq: Once | INTRAVENOUS | Status: AC
  Administered 2021-06-19: 1000 mL via INTRAVENOUS

## 2021-06-19 MED ORDER — LIDOCAINE HCL 2 % IJ SOLN
20.0000 mL | Freq: Once | INTRAMUSCULAR | Status: AC
Start: 2021-06-19 — End: 2021-06-19
  Administered 2021-06-19: 400 mg

## 2021-06-19 MED ORDER — PENTAFLUOROPROP-TETRAFLUOROETH EX AERO
INHALATION_SPRAY | Freq: Once | CUTANEOUS | Status: AC
  Administered 2021-06-19: 30 via TOPICAL
  Filled 2021-06-19: qty 116

## 2021-06-19 MED ORDER — DEXAMETHASONE SODIUM PHOSPHATE 10 MG/ML IJ SOLN
20.0000 mg | Freq: Once | INTRAMUSCULAR | Status: AC
  Administered 2021-06-19: 20 mg

## 2021-06-19 MED ORDER — SODIUM CHLORIDE 0.9% FLUSH
2.0000 mL | Freq: Once | INTRAVENOUS | Status: AC
  Administered 2021-06-19: 2 mL

## 2021-06-19 MED ORDER — ROPIVACAINE HCL 2 MG/ML IJ SOLN
INTRAMUSCULAR | Status: AC
  Filled 2021-06-19: qty 20

## 2021-06-19 MED ORDER — IOHEXOL 180 MG/ML  SOLN
10.0000 mL | Freq: Once | INTRAMUSCULAR | Status: AC
  Administered 2021-06-19: 10 mL via EPIDURAL

## 2021-06-19 MED ORDER — DEXAMETHASONE SODIUM PHOSPHATE 10 MG/ML IJ SOLN
INTRAMUSCULAR | Status: AC
  Filled 2021-06-19: qty 2

## 2021-06-19 NOTE — Progress Notes (Signed)
Safety precautions to be maintained throughout the outpatient stay will include: orient to surroundings, keep bed in low position, maintain call bell within reach at all times, provide assistance with transfer out of bed and ambulation.  

## 2021-06-20 ENCOUNTER — Telehealth: Payer: Self-pay

## 2021-06-20 NOTE — Telephone Encounter (Signed)
Pt was called concerning her procedure on yesterday. No answer and no voice mail set up available.

## 2021-06-21 ENCOUNTER — Encounter: Payer: Self-pay | Admitting: Obstetrics and Gynecology

## 2021-06-21 ENCOUNTER — Other Ambulatory Visit: Payer: Self-pay

## 2021-06-21 ENCOUNTER — Ambulatory Visit (INDEPENDENT_AMBULATORY_CARE_PROVIDER_SITE_OTHER): Payer: Medicare Other | Admitting: Obstetrics and Gynecology

## 2021-06-21 VITALS — BP 116/61 | HR 93 | Ht 67.0 in | Wt 180.0 lb

## 2021-06-21 DIAGNOSIS — N952 Postmenopausal atrophic vaginitis: Secondary | ICD-10-CM

## 2021-06-21 DIAGNOSIS — N951 Menopausal and female climacteric states: Secondary | ICD-10-CM | POA: Diagnosis not present

## 2021-06-21 DIAGNOSIS — N941 Unspecified dyspareunia: Secondary | ICD-10-CM

## 2021-06-21 DIAGNOSIS — Z90710 Acquired absence of both cervix and uterus: Secondary | ICD-10-CM | POA: Diagnosis not present

## 2021-06-21 MED ORDER — ESTRADIOL 0.075 MG/24HR TD PTWK: 0.0750 mg | MEDICATED_PATCH | TRANSDERMAL | 12 refills | Status: DC

## 2021-06-21 NOTE — Progress Notes (Signed)
    GYNECOLOGY PROGRESS NOTE  Subjective:    Patient ID: Monique [name], female    DOB: [birth year], 50 y.o.   MRN: [id number]  HPI  Patient is a 50 y.o. [id number] female who presents for dyspareunia since her hysterectomy several months ago.  She has also been noting some cramping.  Denies any vaginal bleeding.  Also, Monique [name] is stil having hot flushes despite use of HRT patch. Also noting clitoral "regression", stating that her clitoris used to be more prominent and now seems to be more retracted behind the skin.  Wonders if this is normal.  The following portions of the patient's history were reviewed and updated as appropriate: allergies, current medications, past family history, past medical history, past social history, past surgical history, and problem list.  Review of Systems Pertinent items noted in HPI and remainder of comprehensive ROS otherwise negative.   Objective:   Blood pressure 116/61, pulse 93, height 5\' 7"  (1.702 m), weight 180 lb (81.6 kg). Body mass index is 28.19 kg/m. General appearance: alert and no distress Abdomen: soft, non-tender; bowel sounds normal; no masses,  no organomegaly Pelvic: external genitalia normal, rectovaginal septum normal.  Clitoris and clitoral hood overall appear normal, however slight decrease in elasticity of the tissues is noted.  Vagina is without discharge, mildly atrophic.   Uterus and cervix surgically absent. Vaginal cuff well-healed, however some mild point tenderness noted at base of the vaginal cuff with Q-tip.  Adnexae not examined.  Extremities: extremities normal, atraumatic, no cyanosis or edema Neurologic: Grossly normal   Assessment:   1. Dyspareunia in female   2. Vaginal atrophy   3. S/P vaginal hysterectomy      Plan:   1. Dyspareunia in female -May be secondary to mild point tenderness at the vaginal cuff.  Tenderness may be secondary to scar tissue or residual suture material internally.  Also patient does have  some mild vaginal atrophy present, which could also be contributing.  Will administer trial of Premarin cream to see if this will help symptoms.  We will follow-up in 2 to 3 weeks to reassess symptoms.  2. Vaginal atrophy - See above.  Will administer trial of local estrogen therapy to see if improvement occurs.  Given samples of Premarin cream in the office today.  3. S/P vaginal hysterectomy Patient is status post hysterectomy.   4.  Perimenopausal vasomotor symptoms -Patient currently on 0.06 mg of Climara patch.  Can increase dose to 0.075 mg.  New prescription given.  We will follow-up again on symptoms at next visit.    Monique Maid, MD [facility]

## 2021-06-25 ENCOUNTER — Encounter: Payer: Self-pay | Admitting: Obstetrics and Gynecology

## 2021-06-25 ENCOUNTER — Other Ambulatory Visit (HOSPITAL_COMMUNITY): Payer: Self-pay

## 2021-06-25 ENCOUNTER — Telehealth (INDEPENDENT_AMBULATORY_CARE_PROVIDER_SITE_OTHER): Payer: Medicare Other | Admitting: Family Medicine

## 2021-06-25 ENCOUNTER — Encounter: Payer: Self-pay | Admitting: Family Medicine

## 2021-06-25 DIAGNOSIS — Z91199 Patient's noncompliance with other medical treatment and regimen due to unspecified reason: Secondary | ICD-10-CM

## 2021-06-25 DIAGNOSIS — R051 Acute cough: Secondary | ICD-10-CM

## 2021-06-25 MED ORDER — BENZONATATE 100 MG PO CAPS: 200.0000 mg | ORAL_CAPSULE | Freq: Two times a day (BID) | ORAL | 0 refills | Status: DC | PRN

## 2021-06-25 NOTE — Progress Notes (Addendum)
Virtual Visit via Video Note  I connected with Monique [name] on 06/25/21 at  1:40 PM EDT by a video enabled telemedicine application and verified that I am speaking with the correct person using two identifiers.  Location: Patient: home Provider: [facility]   I discussed the limitations of evaluation and management by telemedicine and the availability of in person appointments. The patient expressed understanding and agreed to proceed.  History of Present Illness:  UPPER RESPIRATORY TRACT INFECTION - symptom onset 06/22/21 - h/o emphysema on prn albuterol, daily advair. Current smoker.  - hasn't taken chronic medications since Thursday, was afraid to take them with mucinex - flu shot 9/28  Worst symptom: Fever: no Cough: yes Shortness of breath:  chronic, no more than usual Chest pain: yes, with cough Chest tightness: no Chest congestion: yes Sinus pressure: yes Headache: no Ear pain: no  Ear pressure: no  Vomiting: no Sick contacts: yes Relief with OTC cold/cough medications:  yes   Treatments attempted: mucinex    Observations/Objective:  Tired appearing, in NAD. Speaks in full sentences, hoarse voice.  Assessment and Plan:  URI Doing well with mild sx. Would be a candidate for COVID treatment if positive, will test. Reviewed OTC symptom relief, self-quarantine guidelines, and emergency precautions. Advised to restart chronic medications. Rx tessalon sent. Recommend tobacco cessation.    I discussed the assessment and treatment plan with the patient. The patient was provided an opportunity to ask questions and all were answered. The patient agreed with the plan and demonstrated an understanding of the instructions.   The patient was advised to call back or seek an in-person evaluation if the symptoms worsen or if the condition fails to improve as anticipated.  I provided 16 minutes of non-face-to-face time during this encounter.   Myles Gip, DO

## 2021-06-25 NOTE — Patient Instructions (Signed)
Menopause and Hormone Replacement Therapy Menopause is a normal time of life when menstrual periods stop completely and the ovaries stop producing the female hormones estrogen and progesterone. Low levels of these hormones can affect your health and cause symptoms. Hormonereplacement therapy (HRT) can relieve some of those symptoms. HRT is the use of artificial (synthetic) hormones to replace hormones that your body has stopped producing because youhave reached menopause. Types of HRT  HRT may consist of the synthetic hormones estrogen and progestin, or it may consist of estrogen-only therapy. You and your health care provider will decidewhich form of HRT is best for you. If you choose to be on HRT and you have a uterus, estrogen and progestin are usually prescribed. Estrogen-only therapy is used for women who do not have auterus. Possible options for taking HRT include: Pills. Patches. Gels. Sprays. Vaginal cream. Vaginal rings. Vaginal inserts. The amount of hormones that you take and how long you take them varies according to your health. It is important to: Begin HRT with the lowest possible dosage. Stop HRT as soon as your health care provider tells you to stop. Work with your health care provider so that you feel informed and comfortable with your decisions. Tell a health care provider about: Any allergies you have. Whether you have had blood clots or know of any risk factors you may have for blood clots. Whether you or family members have had cancer, especially cancer of the breasts, ovaries, or uterus. Any surgeries you have had. All medicines you are taking, including vitamins, herbs, eye drops, creams, and over-the-counter medicines. Whether you are pregnant or may be pregnant. Any medical conditions you have. What are the benefits? HRT can reduce the frequency and severity of menopausal symptoms. Benefits of HRT vary according to the kind of symptoms that you have, how severe  they are, and your overall health. HRT may help to improve the following symptoms of menopause: Hot flashes and night sweats. These are sudden feelings of heat that spread over the face and body. The skin may turn red, like a blush. Night sweats are hot flashes that happen while you are sleeping or trying to sleep. Bone loss (osteoporosis). The body loses calcium more quickly after menopause, causing the bones to become weaker. This can increase the risk for bone breaks (fractures). Vaginal dryness. The lining of the vagina can become thin and dry, which can cause pain during sex or cause infection, burning, or itching. Urinary tract infections. Urinary incontinence. This is the inability to control when you urinate. Irritability. Short-term memory problems. What are the risks? Risks of HRT vary depending on your individual health and medical history. Risks of HRT also depend on whether you receive both estrogen and progestin or you receive estrogen only. HRT may increase the risk of: Spotting. This is when a small amount of blood leaks from the vagina unexpectedly. Endometrial cancer. This cancer is in the lining of the uterus (endometrium). Breast cancer. Increased density of breast tissue. This can make it harder to find breast cancer on a breast X-ray (mammogram). Stroke. Heart disease. Blood clots. Gallbladder disease or liver disease. Risks of HRT can increase if you have any of the following conditions: Endometrial cancer. Liver disease. Heart disease. Breast cancer. History of blood clots. History of stroke. Follow these instructions at home: Pap tests Have Pap tests done as often as told by your health care provider. A Pap test is sometimes called a Pap smear. It is a screening test that   is used to check for signs of cancer of the cervix and vagina. A Pap test can also identify the presence of infection or precancerous changes. Pap tests may be done: Every 3 years, starting at  age 21. Every 5 years, starting after age 30, in combination with testing for human papillomavirus (HPV). More often or less often depending on other medical conditions you have, your age, and other risk factors. It is up to you to get the results of your Pap test. Ask your health care provider, or the department that is doing the test, when your results will be ready. General instructions Take over-the-counter and prescription medicines only as told by your health care provider. Do not use any products that contain nicotine or tobacco. These products include cigarettes, chewing tobacco, and vaping devices, such as e-cigarettes. If you need help quitting, ask your health care provider. Get mammograms, pelvic exams, and medical checkups as often as told by your health care provider. Keep all follow-up visits. This is important. Contact a health care provider if you have: Pain or swelling in your legs. Lumps or changes in your breasts or armpits. Pain, burning, or bleeding when you urinate. Unusual vaginal bleeding. Dizziness or headaches. Pain in your abdomen. Get help right away if you have: Shortness of breath. Chest pain. Slurred speech. Weakness or numbness in any part of your arms or legs. These symptoms may represent a serious problem that is an emergency. Do not wait to see if the symptoms will go away. Get medical help right away. Call your local emergency services (911 in the [place] Do not drive yourself to the hospital. Summary Menopause is a normal time of life when menstrual periods stop completely and the ovaries stop producing the female hormones estrogen and progesterone. HRT can reduce the frequency and severity of menopausal symptoms. Risks of HRT vary depending on your individual health and medical history. This information is not intended to replace advice given to you by your health care provider. Make sure you discuss any questions you have with your healthcare  provider. Document Revised: 03/06/2020 Document Reviewed: 03/06/2020 Elsevier Patient Education  2022 [employer]  

## 2021-06-25 NOTE — Addendum Note (Signed)
Addended by: Myles Gip on: 06/25/2021 02:22 PM   Modules accepted: Orders, Level of Service

## 2021-06-25 NOTE — Patient Instructions (Addendum)
It was great to see you!  Our plans for today:  - See below for self-isolation guidelines. You may end your quarantine if your test is negative, or if positive, once you are 10 days from symptom onset and fever free for 24 hours without use of tylenol or ibuprofen. Wear a well-fitting N95 mask if you have to go out and about. - If your test is positive, we will start an antiviral to treat COVID. See [email or web address]  for more information about the medication, [email or web address]. - I recommend getting vaccinated once you are healed from your current infection. - I recommend you stop smoking. - Certainly, if you are having difficulties breathing or unable to keep down fluids, go to the Emergency Department.   Take care and seek immediate care sooner if you develop any concerns.   Dr. Ky Barban     Person Under Monitoring Name: Monique [name]  Location: [place] [id number]   Infection Prevention Recommendations for Individuals Confirmed to have, or Being Evaluated for, 2019 Novel Coronavirus (COVID-19) Infection Who Receive Care at Home  Individuals who are confirmed to have, or are being evaluated for, COVID-19 should follow the prevention steps below until a healthcare provider or local or state health department says they can return to normal activities.  Stay home except to get medical care You should restrict activities outside your home, except for getting medical care. Do not go to work, school, or public areas, and do not use public transportation or taxis.  Call ahead before visiting your doctor Before your medical appointment, call the healthcare provider and tell them that you have, or are being evaluated for, COVID-19 infection. This will help the healthcare provider's office take steps to keep other people from getting infected. Ask your healthcare provider to call the local or state health department.  Monitor your symptoms Seek  prompt medical attention if your illness is worsening (e.g., difficulty breathing). Before going to your medical appointment, call the healthcare provider and tell them that you have, or are being evaluated for, COVID-19 infection. Ask your healthcare provider to call the local or state health department.  Wear a facemask You should wear a facemask that covers your nose and mouth when you are in the same room with other people and when you visit a healthcare provider. People who live with or visit you should also wear a facemask while they are in the same room with you.  Separate yourself from other people in your home As much as possible, you should stay in a different room from other people in your home. Also, you should use a separate bathroom, if available.  Avoid sharing household items You should not share dishes, drinking glasses, cups, eating utensils, towels, bedding, or other items with other people in your home. After using these items, you should wash them thoroughly with soap and water.  Cover your coughs and sneezes Cover your mouth and nose with a tissue when you cough or sneeze, or you can cough or sneeze into your sleeve. Throw used tissues in a lined trash can, and immediately wash your hands with soap and water for at least 20 seconds or use an alcohol-based hand rub.  Wash your [facility] your hands often and thoroughly with soap and water for at least 20 seconds. You can use an alcohol-based hand sanitizer if soap and water are not available and if your hands are not visibly dirty. Avoid touching your eyes, nose, and mouth with  unwashed hands.   Prevention Steps for Caregivers and Household Members of Individuals Confirmed to have, or Being Evaluated for, COVID-19 Infection Being Cared for in the Home  If you live with, or provide care at home for, a person confirmed to have, or being evaluated for, COVID-19 infection please follow these guidelines to prevent  infection:  Follow healthcare provider's instructions Make sure that you understand and can help the patient follow any healthcare provider instructions for all care.  Provide for the patient's basic needs You should help the patient with basic needs in the home and provide support for getting groceries, prescriptions, and other personal needs.  Monitor the patient's symptoms If they are getting sicker, call his or her medical provider and tell them that the patient has, or is being evaluated for, COVID-19 infection. This will help the healthcare provider's office take steps to keep other people from getting infected. Ask the healthcare provider to call the local or state health department.  Limit the number of people who have contact with the patient If possible, have only one caregiver for the patient. Other household members should stay in another home or place of residence. If this is not possible, they should stay in another room, or be separated from the patient as much as possible. Use a separate bathroom, if available. Restrict visitors who do not have an essential need to be in the home.  Keep older adults, very young children, and other sick people away from the patient Keep older adults, very young children, and those who have compromised immune systems or chronic health conditions away from the patient. This includes people with chronic heart, lung, or kidney conditions, diabetes, and cancer.  Ensure good ventilation Make sure that shared spaces in the home have good air flow, such as from an air conditioner or an opened window, weather permitting.  Wash your hands often Wash your hands often and thoroughly with soap and water for at least 20 seconds. You can use an alcohol based hand sanitizer if soap and water are not available and if your hands are not visibly dirty. Avoid touching your eyes, nose, and mouth with unwashed hands. Use disposable paper towels to dry your  hands. If not available, use dedicated cloth towels and replace them when they become wet.  Wear a facemask and gloves Wear a disposable facemask at all times in the room and gloves when you touch or have contact with the patient's blood, body fluids, and/or secretions or excretions, such as sweat, saliva, sputum, nasal mucus, vomit, urine, or feces.  Ensure the mask fits over your nose and mouth tightly, and do not touch it during use. Throw out disposable facemasks and gloves after using them. Do not reuse. Wash your hands immediately after removing your facemask and gloves. If your personal clothing becomes contaminated, carefully remove clothing and launder. Wash your hands after handling contaminated clothing. Place all used disposable facemasks, gloves, and other waste in a lined container before disposing them with other household waste. Remove gloves and wash your hands immediately after handling these items.  Do not share dishes, glasses, or other household items with the patient Avoid sharing household items. You should not share dishes, drinking glasses, cups, eating utensils, towels, bedding, or other items with a patient who is confirmed to have, or being evaluated for, COVID-19 infection. After the person uses these items, you should wash them thoroughly with soap and water.  Wash laundry thoroughly Immediately remove and wash clothes or  bedding that have blood, body fluids, and/or secretions or excretions, such as sweat, saliva, sputum, nasal mucus, vomit, urine, or feces, on them. Wear gloves when handling laundry from the patient. Read and follow directions on labels of laundry or clothing items and detergent. In general, wash and dry with the warmest temperatures recommended on the label.  Clean all areas the individual has used often Clean all touchable surfaces, such as counters, tabletops, doorknobs, bathroom fixtures, toilets, phones, keyboards, tablets, and bedside tables,  every day. Also, clean any surfaces that may have blood, body fluids, and/or secretions or excretions on them. Wear gloves when cleaning surfaces the patient has come in contact with. Use a diluted bleach solution (e.g., dilute bleach with 1 part bleach and 10 parts water) or a household disinfectant with a label that says EPA-registered for coronaviruses. To make a bleach solution at home, add 1 tablespoon of bleach to 1 quart (4 cups) of water. For a larger supply, add  cup of bleach to 1 gallon (16 cups) of water. Read labels of cleaning products and follow recommendations provided on product labels. Labels contain instructions for safe and effective use of the cleaning product including precautions you should take when applying the product, such as wearing gloves or eye protection and making sure you have good ventilation during use of the product. Remove gloves and wash hands immediately after cleaning.  Monitor yourself for signs and symptoms of illness Caregivers and household members are considered close contacts, should monitor their health, and will be asked to limit movement outside of the home to the extent possible. Follow the monitoring steps for close contacts listed on the symptom monitoring form.   ? If you have additional questions, contact your local health department or call the epidemiologist on call at [phone] (available 24/7). ? This guidance is subject to change. For the most up-to-date guidance from [facility], please refer to their website: [email or web address]

## 2021-07-02 NOTE — Progress Notes (Signed)
[place]  [phone] [phone]  ID: Monique [name] OB: [birth year]  MR#: [id number]  WKM#:[id number]  Patient Care Team: [name], MD as PCP - General (Family Medicine) Lloyd Huger, MD as Consulting Physician (Oncology) Lane Hacker, [facility] as Pharmacist (Pharmacist) Greg Cutter, LCSW as [employer] (Licensed Clinical Social Worker)   CHIEF COMPLAINT: CLL, iron deficiency.  INTERVAL HISTORY: Patient returns to clinic today for repeat laboratory work, discussion of her imaging results, and continued evaluation.  She currently feels well and is asymptomatic.  She is tolerating Rubraca without significant side effects.  She denies any fevers or night sweats.  She has a good appetite and denies weight loss.  She has no chest pain, shortness of breath, cough, or hemoptysis.  She denies any nausea, vomiting, constipation, or diarrhea.  She has no melena or hematochezia.  She has no urinary complaints.  Patient offers no further specific complaints today.  REVIEW OF SYSTEMS:   Review of Systems  Constitutional:  Negative for diaphoresis, fever, malaise/fatigue and weight loss.  HENT: Negative.  Negative for congestion.   Respiratory: Negative.  Negative for cough and shortness of breath.   Cardiovascular: Negative.  Negative for chest pain and leg swelling.  Gastrointestinal:  Negative for abdominal pain and constipation.  Genitourinary: Negative.  Negative for dysuria and frequency.  Musculoskeletal: Negative.  Negative for back pain, myalgias and neck pain.  Skin: Negative.   Neurological:  Positive for sensory change. Negative for tingling, focal weakness and weakness.  Psychiatric/Behavioral: Negative.  Negative for depression and suicidal ideas. The patient is not nervous/anxious.    As per HPI. Otherwise, a complete review of systems is negative.  PAST MEDICAL HISTORY: Past Medical History:   Diagnosis Date   Anxiety    Bursitis    leg pain   Carpal tunnel syndrome    Cervical dysplasia    hx LEEP over 18 years ago.    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  (in lymph nodes)   COPD (chronic obstructive pulmonary disease) ([place])    COVID-19 2021   Depression    Eating disorder    GERD (gastroesophageal reflux disease)    History of self-harm    Insomnia    Obsession    Tobacco abuse    Vitamin B12 deficiency (non anemic)     PAST SURGICAL HISTORY: Past Surgical History:  Procedure Laterality Date   BREAST BIOPSY Right 01/05/2018   US guided biopsy of 2 areas and 1 lymph node, MIXED INFLAMMATION AND GIANT CELL REACTION   CERVICAL BIOPSY  W/ LOOP ELECTRODE EXCISION     COLONOSCOPY WITH PROPOFOL N/A 04/21/2019   Procedure: COLONOSCOPY WITH PROPOFOL;  Surgeon: Virgel Manifold, MD;  Location: [facility] ENDOSCOPY;  Service: Gastroenterology;  Laterality: N/A;   OTHER SURGICAL HISTORY     scar tissue removed from vocal cords   TUBAL LIGATION     VAGINAL HYSTERECTOMY N/A 01/22/2021   Procedure: HYSTERECTOMY VAGINAL; BILATERAL SALPINGECTOMY;  Surgeon: Rubie Maid, MD;  Location: [facility] ORS;  Service: Gynecology;  Laterality: N/A;   vocal cord surgery  2005    FAMILY HISTORY: Family History  Problem Relation Age of Onset   Cancer Mother        thyroid   Alcohol abuse Father    Alcohol abuse Brother    Depression Brother    Bipolar disorder Brother    ADD / ADHD Son    Breast  cancer Neg Hx     ADVANCED DIRECTIVES (Y/N):  N  HEALTH MAINTENANCE: Social History   Tobacco Use   Smoking status: Every Day    Packs/day: 1.00    Years: 31.00    Pack years: 31.00    Types: Cigarettes    Start date: 09/25/1986   Smokeless tobacco: Never  Vaping Use   Vaping Use: Former  Substance Use Topics   Alcohol use: Not Currently    Alcohol/week: 0.0 standard drinks    Comment: rarely   Drug use: Yes    Frequency: 7.0 times per week    Types: Marijuana      Colonoscopy:  PAP:  Bone density:  Lipid panel:  No Known Allergies  Current Outpatient Medications  Medication Sig Dispense Refill   Ascorbic Acid (VITAMIN C) 1000 MG tablet Take 1,000 mg by mouth daily as needed (immune health).     atorvastatin (LIPITOR) 10 MG tablet Take 1 tablet (10 mg total) by mouth in the morning. 90 tablet 3   benzonatate (TESSALON PERLES) 100 MG capsule Take 2 capsules (200 mg total) by mouth 2 (two) times daily as needed for cough. 20 capsule 0   Calcium Carbonate-Vit D-Min (GNP CALCIUM 1200) 1200-1000 MG-UNIT CHEW Chew 1,200 mg by mouth daily with breakfast. Take in combination with vitamin D and magnesium. (Patient taking differently: Chew 2 tablets by mouth in the morning, at noon, and at bedtime. Take in combination with vitamin D and magnesium.) 90 tablet 3   Crisaborole (EUCRISA) 2 % OINT Apply 1 application topically as directed. Qd to bid aa rash on hands and feet until clear, then prn flares 60 g 11   DULoxetine (CYMBALTA) 60 MG capsule Take 1 capsule (60 mg total) by mouth daily. 90 capsule 0   estradiol (CLIMARA) 0.075 mg/24hr patch Place 1 patch (0.075 mg total) onto the skin once a week. 4 patch 12   fluticasone-salmeterol (ADVAIR DISKUS) 250-50 MCG/ACT AEPB Inhale 1 puff into the lungs in the morning and at bedtime. 60 each 5   ibrutinib 420 MG TABS Take 1 tablet (420 mg) by mouth daily. 28 tablet 5   loratadine (CLARITIN) 10 MG tablet Take 1 tablet (10 mg total) by mouth every morning. 90 tablet 3   Magnesium 500 MG CAPS Take 500 mg by mouth in the morning.     meloxicam (MOBIC) 15 MG tablet Take 1 tablet (15 mg total) by mouth daily. 90 tablet 1   omeprazole (PRILOSEC) 20 MG capsule Take 1 capsule (20 mg total) by mouth in the morning. 90 capsule 1   Oxymetazoline HCl (RHOFADE) 1 % CREA Qam to face 30 g 11   pregabalin (LYRICA) 100 MG capsule Take 100 mg by mouth 3 (three) times daily.     Ruxolitinib Phosphate (OPZELURA) 1.5 % CREA Apply 1  application topically as directed. Qd to bid aa rash on hands and feet until clear, then prn flares 60 g 11   sertraline (ZOLOFT) 100 MG tablet TAKE 1 AND 1/2 TABLETS BY MOUTH EVERY MORNING 135 tablet 0   tiZANidine (ZANAFLEX) 2 MG tablet Take 1 tablet (2 mg total) by mouth every 8 (eight) hours as needed for muscle spasms. 90 tablet 1   vitamin B-12 (CYANOCOBALAMIN) 500 MCG tablet Take 250 mcg by mouth every morning.     No current facility-administered medications for this visit.    OBJECTIVE: Vitals:   07/05/21 1437  BP: 102/69  Pulse: 92  Resp: 16  Temp: (!)  97.3 F (36.3 C)     Body mass index is 27.36 kg/m.    ECOG FS:0 - Asymptomatic  General: Well-developed, well-nourished, no acute distress. Eyes: Pink conjunctiva, anicteric sclera. HEENT: Normocephalic, moist mucous membranes. Lungs: No audible wheezing or coughing. Heart: Regular rate and rhythm. Abdomen: Soft, nontender, no obvious distention. Musculoskeletal: No edema, cyanosis, or clubbing. Neuro: Alert, answering all questions appropriately. Cranial nerves grossly intact. Skin: No rashes or petechiae noted. Psych: Normal affect.   LAB RESULTS:  Lab Results  Component Value Date   NA 137 07/05/2021   K 3.7 07/05/2021   CL 104 07/05/2021   CO2 27 07/05/2021   GLUCOSE 183 (H) 07/05/2021   BUN 16 07/05/2021   CREATININE 0.83 07/05/2021   CALCIUM 8.9 07/05/2021   PROT 6.7 07/05/2021   ALBUMIN 4.1 07/05/2021   AST 20 07/05/2021   ALT 16 07/05/2021   ALKPHOS 59 07/05/2021   BILITOT 0.8 07/05/2021   GFRNONAA >60 07/05/2021   GFRAA 79 12/06/2020    Lab Results  Component Value Date   WBC 21.5 (H) 07/05/2021   NEUTROABS 4.0 07/05/2021   HGB 15.3 (H) 07/05/2021   HCT 45.3 07/05/2021   MCV 93.0 07/05/2021   PLT 176 07/05/2021     STUDIES: DG PAIN CLINIC C-ARM 1-60 MIN NO REPORT  Result Date: 06/19/2021 Fluoro was used, but no Radiologist interpretation will be provided. Please refer to "NOTES" tab  for provider progress note.   ASSESSMENT: CLL,  iron deficiency.  PLAN:    1. CLL: Confirmed by peripheral blood flow cytometry.  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 Patient completed cycle 3 of Rituxan plus Treanda on March 26, 2018, but was noted to have progressive disease.  She was then initiated on 480 mg Imbruvica in November 2019.  Her most recent imaging on May 22, 2021 did not reveal any obvious recurrent or progressive disease.  Repeat imaging in 6 months.  Previously, patient discontinued Imbruvica temporarily and was noted to have progression of disease.  Her white blood cell count has trended up slightly to 21.5.  Monitor.  Return to clinic in 3 months with repeat laboratory work and evaluation by clinical pharmacy. 2.  Leukocytosis: Slightly trended up with a lymphocyte predominance.  Monitor. 3.  Trigeminal neuralgia: Patient does not complain of this today.  Appears to be unrelated to CLL or Imbruvica.  MRI of the brain on Jan 25, 2019 was unremarkable.   4.  Depression/anxiety: Chronic.  Continue follow-up and treatment per primary care. 5.  Neuropathic pain/peripheral neuropathy: Chronic and unchanged.  Patient reports she is now on disability.  Continue current follow-up and treatment as per neurology. 6.  Iron deficiency: Resolved.  Patient received 1 dose of IV Feraheme on October 21, 2019.  I spent a total of 30 minutes reviewing chart data, face-to-face evaluation with the patient, counseling and coordination of care as detailed above.     Patient expressed understanding and was in agreement with this plan. She also understands that She can call clinic at any time with any questions, concerns, or complaints.    Lloyd Huger, MD   07/05/2021 4:05 PM

## 2021-07-03 ENCOUNTER — Other Ambulatory Visit: Payer: Self-pay

## 2021-07-03 ENCOUNTER — Encounter: Payer: Self-pay | Admitting: Dermatology

## 2021-07-03 ENCOUNTER — Ambulatory Visit: Payer: Medicare Other | Attending: Pain Medicine | Admitting: Pain Medicine

## 2021-07-03 ENCOUNTER — Ambulatory Visit (INDEPENDENT_AMBULATORY_CARE_PROVIDER_SITE_OTHER): Payer: Medicare Other | Admitting: Dermatology

## 2021-07-03 DIAGNOSIS — M48061 Spinal stenosis, lumbar region without neurogenic claudication: Secondary | ICD-10-CM

## 2021-07-03 DIAGNOSIS — M25551 Pain in right hip: Secondary | ICD-10-CM | POA: Diagnosis not present

## 2021-07-03 DIAGNOSIS — L301 Dyshidrosis [pompholyx]: Secondary | ICD-10-CM | POA: Diagnosis not present

## 2021-07-03 DIAGNOSIS — M5441 Lumbago with sciatica, right side: Secondary | ICD-10-CM | POA: Diagnosis not present

## 2021-07-03 DIAGNOSIS — M79605 Pain in left leg: Secondary | ICD-10-CM

## 2021-07-03 DIAGNOSIS — G8929 Other chronic pain: Secondary | ICD-10-CM

## 2021-07-03 DIAGNOSIS — L988 Other specified disorders of the skin and subcutaneous tissue: Secondary | ICD-10-CM | POA: Diagnosis not present

## 2021-07-03 DIAGNOSIS — L409 Psoriasis, unspecified: Secondary | ICD-10-CM

## 2021-07-03 DIAGNOSIS — M5126 Other intervertebral disc displacement, lumbar region: Secondary | ICD-10-CM | POA: Diagnosis not present

## 2021-07-03 DIAGNOSIS — M5417 Radiculopathy, lumbosacral region: Secondary | ICD-10-CM | POA: Diagnosis not present

## 2021-07-03 DIAGNOSIS — L719 Rosacea, unspecified: Secondary | ICD-10-CM

## 2021-07-03 DIAGNOSIS — R937 Abnormal findings on diagnostic imaging of other parts of musculoskeletal system: Secondary | ICD-10-CM | POA: Diagnosis not present

## 2021-07-03 DIAGNOSIS — M5136 Other intervertebral disc degeneration, lumbar region: Secondary | ICD-10-CM

## 2021-07-03 DIAGNOSIS — L209 Atopic dermatitis, unspecified: Secondary | ICD-10-CM | POA: Diagnosis not present

## 2021-07-03 DIAGNOSIS — M5442 Lumbago with sciatica, left side: Secondary | ICD-10-CM

## 2021-07-03 DIAGNOSIS — M5416 Radiculopathy, lumbar region: Secondary | ICD-10-CM

## 2021-07-03 DIAGNOSIS — M79604 Pain in right leg: Secondary | ICD-10-CM | POA: Diagnosis not present

## 2021-07-03 MED ORDER — RHOFADE 1 % EX CREA: TOPICAL_CREAM | CUTANEOUS | 11 refills | Status: DC

## 2021-07-03 MED ORDER — OPZELURA 1.5 % EX CREA
1.0000 | TOPICAL_CREAM | CUTANEOUS | 11 refills | Status: DC
Start: 2021-07-03 — End: 2022-03-08

## 2021-07-03 MED ORDER — EUCRISA 2 % EX OINT: 1.0000 "application " | TOPICAL_OINTMENT | CUTANEOUS | 11 refills | Status: DC

## 2021-07-03 NOTE — Patient Instructions (Addendum)
If you have any questions or concerns for your doctor, please call our main line at [phone] and press option 4 to reach your doctor's medical assistant. If no one answers, please leave a voicemail as directed and we will return your call as soon as possible. Messages left after 4 pm will be answered the following business day.   You may also send [place] a message via [place]. We typically respond to MyChart messages within 1-2 business days.  For prescription refills, please ask your pharmacy to contact our office. Our fax number is [phone].  If you have an urgent issue when the clinic is closed that cannot wait until the next business day, you can page your doctor at the number below.    Please note that while we do our best to be available for urgent issues outside of office hours, we are not available 24/7.   If you have an urgent issue and are unable to reach [place], you may choose to seek medical care at your doctor's office, retail clinic, urgent care center, or emergency room.  If you have a medical emergency, please immediately call 911 or go to the emergency department.  Pager Numbers  - Dr. Nehemiah Massed: [phone]  - Dr. Laurence Ferrari: [phone]  - Dr. Nicole Kindred: [phone]  In the event of inclement weather, please call our main line at [phone] for an update on the status of any delays or closures.  Dermatology Medication Tips: Please keep the boxes that topical medications come in in order to help keep track of the instructions about where and how to use these. Pharmacies typically print the medication instructions only on the boxes and not directly on the medication tubes.   If your medication is too expensive, please contact our office at [phone] option 4 or send [place] a message through [place].   We are unable to tell what your co-pay for medications will be in advance as this is different depending on your insurance coverage. However, we may be able to find a substitute  medication at lower cost or fill out paperwork to get insurance to cover a needed medication.   If a prior authorization is required to get your medication covered by your insurance company, please allow [place] 1-2 business days to complete this process.  Drug prices often vary depending on where the prescription is filled and some pharmacies may offer cheaper prices.  The website www.goodrx.com contains coupons for medications through different pharmacies. The prices here do not account for what the cost may be with help from insurance (it may be cheaper with your insurance), but the website can give you the price if you did not use any insurance.  - You can print the associated coupon and take it with your prescription to the pharmacy.  - You may also stop by our office during regular business hours and pick up a GoodRx coupon card.  - If you need your prescription sent electronically to a different pharmacy, notify our office through [place] or by phone at [phone] option 4.   Crepey skin on legs Recommend Alastin body cream on legs, then Cerave Cream over top at bedtime

## 2021-07-03 NOTE — Progress Notes (Signed)
Patient: Monique [name]  Service Category: E/M  Provider: Gaspar Cola, MD  DOB: [birth year]  DOS: 07/03/2021  Location: Office  MRN: [id number]  Setting: Ambulatory outpatient  Referring Provider: [name], MD  Type: Established Patient  Specialty: Interventional Pain Management  PCP: [name], MD  Location: Remote location  Delivery: TeleHealth     Virtual Encounter - Pain Management PROVIDER NOTE: Information contained herein reflects review and annotations entered in association with encounter. Interpretation of such information and data should be left to medically-trained personnel. Information provided to patient can be located elsewhere in the medical record under "Patient Instructions". Document created using STT-dictation technology, any transcriptional errors that may result from process are unintentional.    Contact & Pharmacy Preferred: [phone] Home: [phone] (home) Mobile: [phone] (mobile) E-mail: [email or web address]  [facility] - [place], [place] - [address] [place] [address] Phone: [phone] Fax: [phone]  [place], [place] - [name] [place] [address] Phone: [phone] Fax: [phone]  [id number]- [place] [address] [id number]: [phone]: [phone]  Pre-screening  Ms. SPringleoffered "in-person" vs "virtual" encounter. She indicated preferring virtual for this encounter.   Reason COVID-19*  Social distancing based on CDC and AMA recommendations.   I contacted Monique Hockeyon 07/03/2021 via telephone.      I clearly identified myself as FGaspar Cola MD. I verified that I was speaking with the correct person using two identifiers (Name: Monique [name] and date of birth: 104-18-1972.  Consent I sought verbal  advanced consent from Monique Hockeyfor virtual visit interactions. I informed Ms. SMethenyof possible security and privacy concerns, risks, and limitations associated with providing "not-in-person" medical evaluation and management services. I also informed [name] the availability of "in-person" appointments. Finally, I informed her that there would be a charge for the virtual visit and that she could be  personally, fully or partially, financially responsible for it. Ms. SHamblenexpressed understanding and agreed to proceed.   Historic Elements   [name] a 50y.o. year old, female patient evaluated today after our last contact on 06/19/2021. [name] has a past medical history of Anxiety, Bursitis, Carpal tunnel syndrome, Cervical dysplasia, Chronic lymphocytic leukemia ([place] (2018), COPD (chronic obstructive pulmonary disease) ([place], COVID-19 (2021), Depression, Eating disorder, GERD (gastroesophageal reflux disease), History of self-harm, Insomnia, Obsession, Tobacco abuse, and Vitamin B12 deficiency (non anemic). She also  has a past surgical history that includes Cervical biopsy w/ loop electrode excision; Tubal ligation; OTHER SURGICAL HISTORY; Colonoscopy with propofol (N/A, 04/21/2019); Breast biopsy (Right, 01/05/2018); vocal cord surgery (2005); and Vaginal hysterectomy (N/A, 01/22/2021). [name] a current medication list which includes the following prescription(s): vitamin c, atorvastatin, benzonatate, gnp calcium 1200, duloxetine, estradiol, fluticasone-salmeterol, ibrutinib, loratadine, magnesium, meloxicam, omeprazole, pregabalin, sertraline, tizanidine, vitamin b-12, eucrisa, rhofade, and opzelura. She  reports that she has been smoking cigarettes. She started smoking about 34 years ago. She has a 31.00 pack-year smoking history. She has never used smokeless tobacco. She reports that she does not currently use alcohol. She reports current drug use. Frequency: 7.00 times  per week. Drug: Marijuana. Ms. SSchromhas No Known Allergies.   HPI  Today, she is being contacted for a post-procedure assessment.  The patient indicates that  immediately after the procedure she had some shooting pains into the lower back.  She refers that this pain was (Midline).  This has now completely gone away.  Currently she is enjoying an ongoing 85 to 100% improvement of her leg pain.  The patient states that the pain has gone from being constant to being more intermittent.  She states that it feels a lot better than it did before and for now she feels that she does not need anything else.  The patient was encouraged to give [place] a call whenever the pain begins to come back rather than waiting until he gets really bad.  She understood and accepted.  Post-Procedure Evaluation  Procedure (06/19/2021):  Procedure:           Anesthesia, Analgesia, Anxiolysis:  Type: Trans-Foraminal Epidural Steroid Injection          Purpose: Diagnostic/Therapeutic Region: Posterolateral Lumbosacral Target Area: The 6 o'clock position under the pedicle, on the affected side. Approach: Posterior Percutaneous Paravertebral approach. Level: L4 Level Laterality: Bilateral        Type: Local Anesthesia Local Anesthetic: Lidocaine 1-2% Sedation: Minimal Anxiolysis  Indication(s): Anxiety & Analgesia Route: Infiltration ([place]/IM) IV Access: Available     Position: Prone    Indications: 1. 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 Lumbar radiculitis (Right)   5. Lumbosacral radiculopathy/radiculitis at L5 (Bilateral)   6. DDD (degenerative disc disease), lumbar   7. Chronic lower extremity pain (1ry area of Pain) (Bilateral) (R>L)   8. Chronic low back pain (2ry area of Pain) (Bilateral) (R>L) w/ sciatica (Bilateral)   9. Chronic hip pain (Right)   10. Abnormal MRI, lumbar spine (06/02/2020)      Pain Score: Pre-procedure: 5 /10 Post-procedure: 0-No pain/10   Anxiolysis: Please see nurses note.  Effectiveness during initial hour after procedure (Ultra-Short Term Relief): 85-90 %.  Local anesthetic used: Long-acting (4-6 hours) Effectiveness: Defined as any analgesic benefit obtained secondary to the administration of local anesthetics. This carries significant diagnostic value as to the etiological location, or anatomical origin, of the pain. Duration of benefit is expected to coincide with the duration of the local anesthetic used.  Effectiveness during initial 4-6 hours after procedure (Short-Term Relief): 85-90 %.  Long-term benefit: Defined as any relief past the pharmacologic duration of the local anesthetics.  Effectiveness past the initial 6 hours after procedure (Long-Term Relief): 95 %.  Benefits, current: Defined as benefit present at the time of this evaluation.   Analgesia: The patient indicates that the pain is currently 100% better than it was before, but she still having some discomfort.  She refers that now instead of being constant pain, it is only intermittent.  In fact, at the time of this call she indicated that she was not having any significant pain down the legs. Function: [name] reports improvement in function ROM: [name] reports improvement in ROM  Pharmacotherapy Assessment   Analgesic: No opioid analgesics prescribed by our practice. See 05/13/2018 UDS (+) Undeclared THC.   Monitoring: [place] PMP: PDMP reviewed during this encounter.       Pharmacotherapy: No side-effects or adverse reactions reported. Compliance: No problems identified. Effectiveness: Clinically acceptable. Plan: Refer to "POC". UDS:  Summary  Date Value Ref Range Status  05/13/2018 FINAL  Final    Comment:    ==================================================================== TOXASSURE COMP DRUG  ANALYSIS,UR ==================================================================== Test  Result       Flag       Units Drug Present and Declared for Prescription Verification   Gabapentin                     PRESENT      EXPECTED Drug Present not Declared for Prescription Verification   Carboxy-THC                    213          UNEXPECTED ng/mg creat    Carboxy-THC is a metabolite of tetrahydrocannabinol  (THC).    Source of [facility] is most commonly illicit, but THC is also present    in a scheduled prescription medication.   Naproxen                       PRESENT      UNEXPECTED Drug Absent but Declared for Prescription Verification   Hydrocodone                    Not Detected UNEXPECTED ng/mg creat   Tramadol                       Not Detected UNEXPECTED ng/mg creat   Citalopram                     Not Detected UNEXPECTED   Prochlorperazine               Not Detected UNEXPECTED   Acetaminophen                  Not Detected UNEXPECTED    Acetaminophen, as indicated in the declared medication list, is    not always detected even when used as directed. ==================================================================== Test                      Result    Flag   Units      Ref Range   Creatinine              218              mg/dL      >=20 ==================================================================== Declared Medications:  The flagging and interpretation on this report are based on the  following declared medications.  Unexpected results may arise from  inaccuracies in the declared medications.  **Note: The testing scope of this panel includes these medications:  Escitalopram (Lexapro)  Gabapentin (Neurontin)  Hydrocodone (Norco)  Prochlorperazine (Compazine)  Tramadol (Ultram)  **Note: The testing scope of this panel does not include small to  moderate amounts of these reported medications:  Acetaminophen (Norco)  **Note: The testing scope of this  panel does not include following  reported medications:  Umeclidinium (Anoro Ellipta)  Varenicline (Chantix)  Vilanterol (Anoro Ellipta) ==================================================================== For clinical consultation, please call [phone]. ====================================================================      Laboratory Chemistry Profile   Renal Lab Results  Component Value Date   BUN 18 04/12/2021   CREATININE 0.90 63/09/6008   BCR NOT APPLICABLE 93/23/5573   GFRAA 79 12/06/2020   GFRNONAA >60 04/12/2021    Hepatic Lab Results  Component Value Date   AST 15 04/12/2021   ALT 10 04/12/2021   ALBUMIN 3.7 04/12/2021   ALKPHOS 78 04/12/2021   HCVAB <0.1 01/22/2017    Electrolytes Lab Results  Component Value Date   NA 135 04/12/2021  K 3.9 04/12/2021   CL 101 04/12/2021   CALCIUM 8.6 (L) 04/12/2021   MG 1.9 09/28/2020   PHOS 3.0 09/28/2020    Bone Lab Results  Component Value Date   VD25OH 51 08/31/2019   25OHVITD1 27 (L) 05/13/2018   25OHVITD2 <1.0 05/13/2018   25OHVITD3 27 05/13/2018    Inflammation (CRP: Acute Phase) (ESR: Chronic Phase) Lab Results  Component Value Date   CRP 0.7 08/21/2018   ESRSEDRATE 2 08/21/2018         Note: Above Lab results reviewed.  Imaging  DG PAIN CLINIC C-ARM 1-60 MIN NO REPORT Fluoro was used, but no Radiologist interpretation will be provided.  Please refer to "NOTES" tab for provider progress note.  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  Plan of Care  Problem-specific:  No problem-specific Assessment & Plan notes found for this encounter.  [name] has a current medication list which includes the following long-term medication(s): atorvastatin, gnp calcium 1200, duloxetine, estradiol, fluticasone-salmeterol, loratadine, omeprazole, pregabalin, sertraline, and tizanidine.  Pharmacotherapy (Medications Ordered): No orders of the defined types were placed in this encounter.  Orders:  No orders of the defined types were placed in this encounter.  Follow-up plan:   Return if symptoms worsen or fail to improve.      Interventional Therapies  Risk  Complexity Considerations:   Estimated body mass index is 28.19 kg/m as calculated from the following:   Height as of 06/21/21: 5' 7"  (1.702 m).   Weight as of 06/21/21: 180 lb (81.6 kg). WNL   Planned  Pending:      Under consideration:   Diagnostic bilateral lumbar facet MBB #1  Possible bilateral lumbar facet RFA  Diagnostic/therapeutic left L4-5 LESI  Diagnostic/therapeutic bilateral L5 TFESI    Completed:   Therapeutic bilateral L4 TFESI x1 (06/19/2021) (85-90/85-90/95/95-100)  Therapeutic right L4 TFESI x2 (06/29/2019) (100/100/100/50)  Therapeutic right L3-4 LESI x2 (06/29/2019) (100/100/100/50)    Therapeutic  Palliative (PRN) options:   Palliative right L4 TFESI + right L3-4 LESI #3 (IV sedation preferred) Palliative bilateral L4 TFESI #2     Recent Visits Date Type Provider Dept  06/19/21 Procedure visit [name], MD [facility]  Showing recent visits within past 90 days and meeting all other requirements Today's Visits Date Type Provider Dept  07/03/21 Office Visit [name], MD [facility]  Showing today's visits and meeting all other requirements Future Appointments No visits were  found meeting these conditions. Showing future appointments within next 90 days and meeting all other requirements I discussed the assessment and treatment plan with the patient. The patient was provided an opportunity to ask questions and all were answered. The patient agreed with the plan and demonstrated an understanding of the instructions.  Patient advised to call back or seek an in-person evaluation if the symptoms or condition worsens.  Duration of encounter: 16 minutes.  Note by: Gaspar Cola, MD Date: 07/03/2021; Time: 1:15 PM

## 2021-07-03 NOTE — Progress Notes (Signed)
   Follow-Up Visit   Subjective  Monique [name] is a 50 y.o. female who presents for the following: Psoriasis (Hands, feet, 23m f/u not using any medications, not bothering her), Rosacea (Face, Rhofade qam), and crepey skin (Legs, discuss treatment).  The following portions of the chart were reviewed this encounter and updated as appropriate:   Tobacco  Allergies  Meds  Problems  Med Hx  Surg Hx  Fam Hx     Review of Systems:  No other skin or systemic complaints except as noted in HPI or Assessment and Plan.  Objective  Well appearing patient in no apparent distress; mood and affect are within normal limits.  A focused examination was performed including face, hands. Relevant physical exam findings are noted in the Assessment and Plan.  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 Qd to bid aa rash on hands and feet until clear, then prn flares  Ruxolitinib Phosphate (OPZELURA) 1.5 % CREA - hands, feet Apply 1 application topically as directed. Qd to bid aa rash on hands and feet until clear, then prn flares  Rosacea face Rosacea is a chronic progressive skin condition usually affecting the face of adults, causing redness and/or acne bumps. It is treatable but not curable. It sometimes affects the eyes (ocular rosacea) as well. It may respond to topical and/or systemic medication and can flare with stress, sun exposure, alcohol, exercise and some foods.  Daily application of broad spectrum spf 30+ sunscreen to face is recommended to reduce flares.  Discussed BBL, $350 per txt session,  several treatments for best results Cont Rhofade cr qam  Related Medications Oxymetazoline HCl (RHOFADE) 1 % CREA Qam to face  Elastosis of skin bil legs Recommend Alastin body cream qhs followed by Cereave cream over top  Return in about 1 year (around 07/03/2022) for rosacea, psoriasis/eczema f/u.  I, Othelia Pulling, RMA, am acting as scribe for [name], MD . Documentation: I have reviewed the above documentation for accuracy and completeness, and I agree with the above.  [name], MD

## 2021-07-04 ENCOUNTER — Other Ambulatory Visit: Payer: Self-pay

## 2021-07-04 ENCOUNTER — Encounter: Payer: Self-pay | Admitting: Oncology

## 2021-07-04 ENCOUNTER — Telehealth: Payer: Medicaid Other | Admitting: Psychiatry

## 2021-07-04 DIAGNOSIS — D509 Iron deficiency anemia, unspecified: Secondary | ICD-10-CM

## 2021-07-04 DIAGNOSIS — C9110 Chronic lymphocytic leukemia of B-cell type not having achieved remission: Secondary | ICD-10-CM

## 2021-07-05 ENCOUNTER — Other Ambulatory Visit: Payer: Self-pay

## 2021-07-05 ENCOUNTER — Inpatient Hospital Stay (HOSPITAL_BASED_OUTPATIENT_CLINIC_OR_DEPARTMENT_OTHER): Payer: Medicare Other | Admitting: Oncology

## 2021-07-05 ENCOUNTER — Inpatient Hospital Stay: Payer: Medicare Other | Attending: Oncology

## 2021-07-05 ENCOUNTER — Encounter: Payer: Self-pay | Admitting: Oncology

## 2021-07-05 VITALS — BP 102/69 | HR 92 | Temp 97.3°F | Resp 16 | Wt 174.7 lb

## 2021-07-05 DIAGNOSIS — F32A Depression, unspecified: Secondary | ICD-10-CM | POA: Insufficient documentation

## 2021-07-05 DIAGNOSIS — D509 Iron deficiency anemia, unspecified: Secondary | ICD-10-CM | POA: Diagnosis not present

## 2021-07-05 DIAGNOSIS — F17210 Nicotine dependence, cigarettes, uncomplicated: Secondary | ICD-10-CM | POA: Insufficient documentation

## 2021-07-05 DIAGNOSIS — C9110 Chronic lymphocytic leukemia of B-cell type not having achieved remission: Secondary | ICD-10-CM

## 2021-07-05 DIAGNOSIS — F419 Anxiety disorder, unspecified: Secondary | ICD-10-CM | POA: Diagnosis not present

## 2021-07-05 DIAGNOSIS — G629 Polyneuropathy, unspecified: Secondary | ICD-10-CM | POA: Insufficient documentation

## 2021-07-05 LAB — COMPREHENSIVE METABOLIC PANEL
ALT: 16 U/L (ref 0–44)
AST: 20 U/L (ref 15–41)
Albumin: 4.1 g/dL (ref 3.5–5.0)
Alkaline Phosphatase: 59 U/L (ref 38–126)
Anion gap: 6 (ref 5–15)
BUN: 16 mg/dL (ref 6–20)
CO2: 27 mmol/L (ref 22–32)
Calcium: 8.9 mg/dL (ref 8.9–10.3)
Chloride: 104 mmol/L (ref 98–111)
Creatinine, Ser: 0.83 mg/dL (ref 0.44–1.00)
GFR, Estimated: >60 mL/min (ref >60)
Glucose, Bld: 183 mg/dL — ABNORMAL HIGH (ref 70–99)
Potassium: 3.7 mmol/L (ref 3.5–5.1)
Sodium: 137 mmol/L (ref 135–145)
Total Bilirubin: 0.8 mg/dL (ref 0.3–1.2)
Total Protein: 6.7 g/dL (ref 6.5–8.1)

## 2021-07-05 LAB — CBC WITH DIFFERENTIAL/PLATELET
Abs Immature Granulocytes: 0.07 10*3/uL (ref 0.00–0.07)
Basophils Absolute: 0.1 10*3/uL (ref 0.0–0.1)
Basophils Relative: 1 %
Eosinophils Absolute: 0.2 10*3/uL (ref 0.0–0.5)
Eosinophils Relative: 1 %
HCT: 45.3 % (ref 36.0–46.0)
Hemoglobin: 15.3 g/dL — ABNORMAL HIGH (ref 12.0–15.0)
Immature Granulocytes: 0 %
Lymphocytes Relative: 60 %
Lymphs Abs: 13 10*3/uL — ABNORMAL HIGH (ref 0.7–4.0)
MCH: 31.4 pg (ref 26.0–34.0)
MCHC: 33.8 g/dL (ref 30.0–36.0)
MCV: 93 fL (ref 80.0–100.0)
Monocytes Absolute: 4.1 10*3/uL — ABNORMAL HIGH (ref 0.1–1.0)
Monocytes Relative: 19 %
Neutro Abs: 4 10*3/uL (ref 1.7–7.7)
Neutrophils Relative %: 19 %
Platelets: 176 10*3/uL (ref 150–400)
RBC: 4.87 MIL/uL (ref 3.87–5.11)
RDW: 15 % (ref 11.5–15.5)
Smear Review: NORMAL
WBC: 21.5 10*3/uL — ABNORMAL HIGH (ref 4.0–10.5)
nRBC: 0 % (ref 0.0–0.2)

## 2021-07-05 LAB — IRON AND TIBC
Iron: 114 ug/dL (ref 28–170)
Saturation Ratios: 39 % — ABNORMAL HIGH (ref 10.4–31.8)
TIBC: 294 ug/dL (ref 250–450)
UIBC: 180 ug/dL

## 2021-07-05 LAB — FERRITIN: Ferritin: 78 ng/mL (ref 11–307)

## 2021-07-05 NOTE — Progress Notes (Signed)
Patient denies new problems/concerns today.   °
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
## 2021-07-11 ENCOUNTER — Telehealth: Payer: Self-pay

## 2021-07-11 ENCOUNTER — Other Ambulatory Visit: Payer: Self-pay | Admitting: Family Medicine

## 2021-07-11 ENCOUNTER — Other Ambulatory Visit: Payer: Self-pay

## 2021-07-11 ENCOUNTER — Telehealth (INDEPENDENT_AMBULATORY_CARE_PROVIDER_SITE_OTHER): Payer: Medicare Other | Admitting: Psychiatry

## 2021-07-11 ENCOUNTER — Encounter: Payer: Self-pay | Admitting: Psychiatry

## 2021-07-11 DIAGNOSIS — F411 Generalized anxiety disorder: Secondary | ICD-10-CM

## 2021-07-11 DIAGNOSIS — F4310 Post-traumatic stress disorder, unspecified: Secondary | ICD-10-CM | POA: Diagnosis not present

## 2021-07-11 DIAGNOSIS — E611 Iron deficiency: Secondary | ICD-10-CM

## 2021-07-11 DIAGNOSIS — F331 Major depressive disorder, recurrent, moderate: Secondary | ICD-10-CM

## 2021-07-11 DIAGNOSIS — G8929 Other chronic pain: Secondary | ICD-10-CM

## 2021-07-11 MED ORDER — ARIPIPRAZOLE 2 MG PO TABS: 2.0000 mg | ORAL_TABLET | Freq: Every day | ORAL | 0 refills | Status: DC

## 2021-07-11 NOTE — Telephone Encounter (Signed)
Monique [name] is not covered as well at this time.

## 2021-07-11 NOTE — Telephone Encounter (Signed)
Patient has Medicare and Medicaid insurance. [place] not covered.

## 2021-07-11 NOTE — Patient Instructions (Addendum)
  Continue sertralline 150 mg daily Start Abilify 2 mg at night  Next appointment- 11/23 at 9 AM,  video Police discussed with their primary care to taper off duloxetine Please contact your therapist for follow-up appointment Please contact your therapist for a follow-up appointment

## 2021-07-16 ENCOUNTER — Encounter: Payer: Self-pay | Admitting: Oncology

## 2021-07-16 NOTE — Telephone Encounter (Signed)
Letter from Medicare states patient was supplied with a temporary supply.

## 2021-07-19 ENCOUNTER — Other Ambulatory Visit: Payer: Self-pay | Admitting: Family Medicine

## 2021-07-19 DIAGNOSIS — E611 Iron deficiency: Secondary | ICD-10-CM

## 2021-07-20 ENCOUNTER — Encounter: Payer: Medicare Other | Admitting: Obstetrics and Gynecology

## 2021-07-20 ENCOUNTER — Other Ambulatory Visit (HOSPITAL_COMMUNITY): Payer: Self-pay

## 2021-07-24 ENCOUNTER — Other Ambulatory Visit (HOSPITAL_COMMUNITY): Payer: Self-pay

## 2021-08-01 DIAGNOSIS — R200 Anesthesia of skin: Secondary | ICD-10-CM | POA: Diagnosis not present

## 2021-08-01 DIAGNOSIS — G5603 Carpal tunnel syndrome, bilateral upper limbs: Secondary | ICD-10-CM | POA: Diagnosis not present

## 2021-08-01 DIAGNOSIS — R202 Paresthesia of skin: Secondary | ICD-10-CM | POA: Diagnosis not present

## 2021-08-01 DIAGNOSIS — R2689 Other abnormalities of gait and mobility: Secondary | ICD-10-CM | POA: Diagnosis not present

## 2021-08-01 DIAGNOSIS — G4489 Other headache syndrome: Secondary | ICD-10-CM | POA: Diagnosis not present

## 2021-08-01 DIAGNOSIS — Z8669 Personal history of other diseases of the nervous system and sense organs: Secondary | ICD-10-CM | POA: Diagnosis not present

## 2021-08-01 DIAGNOSIS — R278 Other lack of coordination: Secondary | ICD-10-CM | POA: Diagnosis not present

## 2021-08-03 ENCOUNTER — Telehealth: Payer: Self-pay | Admitting: Family Medicine

## 2021-08-03 NOTE — Telephone Encounter (Signed)
Pt called and stated that Dr. Modesta Messing took her off of Sertraline an Duloxetine and has put her on  ARIPiprazole (ABILIFY) 2 MG tablet but the [facility] has sent her refills for the other two medications again and she just wanted to make sure she wasn't back on those 2 medications and that she should have received a refill for the ARIPiprazole (ABILIFY) 2 MG tablet  instead / please advise

## 2021-08-03 NOTE — Telephone Encounter (Signed)
Advised pt Abilify was received from [facility] and in process of being filled more than likely, and that the other 2 medications were most likely on an automatic refill.

## 2021-08-06 ENCOUNTER — Telehealth: Payer: Self-pay | Admitting: Psychiatry

## 2021-08-06 ENCOUNTER — Encounter: Payer: Self-pay | Admitting: Family Medicine

## 2021-08-06 ENCOUNTER — Telehealth (INDEPENDENT_AMBULATORY_CARE_PROVIDER_SITE_OTHER): Payer: Medicare Other | Admitting: Family Medicine

## 2021-08-06 DIAGNOSIS — J441 Chronic obstructive pulmonary disease with (acute) exacerbation: Secondary | ICD-10-CM

## 2021-08-06 MED ORDER — PREDNISONE 20 MG PO TABS: 40.0000 mg | ORAL_TABLET | Freq: Every day | ORAL | 0 refills | Status: AC

## 2021-08-06 MED ORDER — AZITHROMYCIN 250 MG PO TABS
ORAL_TABLET | ORAL | 0 refills | Status: AC
Start: 2021-08-06 — End: 2021-08-11

## 2021-08-06 MED ORDER — BENZONATATE 100 MG PO CAPS: 200.0000 mg | ORAL_CAPSULE | Freq: Two times a day (BID) | ORAL | 0 refills | Status: DC | PRN
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
## 2021-08-06 NOTE — Patient Instructions (Signed)
It was great to see you!  Our plans for today:  - Take the antibiotic and steroid as prescribed. - come back to see [place] in person if you are no better after finishing.   Take care and seek immediate care sooner if you develop any concerns.   Dr. Ky Barban

## 2021-08-06 NOTE — Telephone Encounter (Signed)
I see in the note that she contacted her PCP, stating that I advised her to discontinue both sertraline and duloxetine: which is not correct. Could you contact her to make sure to stay on sertraline 150 mg daily and stay on Abilify? The only medication I advised her to discontinue was duloxetine.

## 2021-08-06 NOTE — Progress Notes (Signed)
Virtual Visit via Video Note  I connected with Monique [name] on 08/06/21 at  9:40 AM EST by a video enabled telemedicine application and verified that I am speaking with the correct person using two identifiers.  Location: Patient: home Provider: [facility]   I discussed the limitations of evaluation and management by telemedicine and the availability of in person appointments. The patient expressed understanding and agreed to proceed.  History of Present Illness:  UPPER RESPIRATORY TRACT INFECTION - h/o trigeminal neuralgia - h/o CLL, follows with Onc - h/o emphysema on prn albuterol, daily advair. Current smoker. - previously seen 10/10 for same, recommended COVID testing, did not have done.   Fever: no Cough: yes, productive of phlegm Shortness of breath: not more than usual Chest pain: no Chest congestion: yes Nasal congestion: yes Sinus pressure: yes Face pain: yes Toothache: no Ear pain: no  Ear pressure: no  Vomiting: no Rash: no Context: fluctuating Recurrent sinusitis: no Relief with OTC cold/cough medications: some Treatments attempted: mucinex, tessalon    Observations/Objective:  Well appearing, in NAD. Speaks in full sentences, no resp distress.   Assessment and Plan:  COPD exacerbation Triggered by previous URI. Rx prednisone, Z pack. Refill for tessalon. F/u in person if no better for lung exam. Consider titrating inhaler therapy if needed on follow up.    I discussed the assessment and treatment plan with the patient. The patient was provided an opportunity to ask questions and all were answered. The patient agreed with the plan and demonstrated an understanding of the instructions.   The patient was advised to call back or seek an in-person evaluation if the symptoms worsen or if the condition fails to improve as anticipated.  I provided 11 minutes of non-face-to-face time during this encounter.   Myles Gip, DO

## 2021-08-07 ENCOUNTER — Other Ambulatory Visit: Payer: Self-pay | Admitting: Psychiatry

## 2021-08-07 MED ORDER — SERTRALINE HCL 100 MG PO TABS
150.0000 mg | ORAL_TABLET | Freq: Every day | ORAL | 0 refills | Status: DC
Start: 2021-08-07 — End: 2021-09-12

## 2021-08-07 NOTE — Telephone Encounter (Signed)
spoke with patient and she states that she needs [facility] in regards to the sertraline.    she also wants to know if you still want to see her tomorrow because she hasn't been on both the abilify nor the sertraline

## 2021-08-07 NOTE — Telephone Encounter (Signed)
Sertraline ordered to the pharmacy. Yes, please advise her to keep the appointment to check in/review medication.

## 2021-08-08 ENCOUNTER — Other Ambulatory Visit: Payer: Self-pay

## 2021-08-08 ENCOUNTER — Telehealth (INDEPENDENT_AMBULATORY_CARE_PROVIDER_SITE_OTHER): Payer: Medicare Other | Admitting: Psychiatry

## 2021-08-08 ENCOUNTER — Encounter: Payer: Self-pay | Admitting: Psychiatry

## 2021-08-08 DIAGNOSIS — F4310 Post-traumatic stress disorder, unspecified: Secondary | ICD-10-CM | POA: Diagnosis not present

## 2021-08-08 DIAGNOSIS — F411 Generalized anxiety disorder: Secondary | ICD-10-CM | POA: Diagnosis not present

## 2021-08-08 DIAGNOSIS — G4700 Insomnia, unspecified: Secondary | ICD-10-CM

## 2021-08-08 DIAGNOSIS — F331 Major depressive disorder, recurrent, moderate: Secondary | ICD-10-CM | POA: Diagnosis not present

## 2021-08-08 MED ORDER — ARIPIPRAZOLE 2 MG PO TABS: 2.0000 mg | ORAL_TABLET | Freq: Every day | ORAL | 0 refills | Status: DC

## 2021-08-08 NOTE — Patient Instructions (Addendum)
Start sertraline 50 mg daily for 3 days, then 100 mg daily for 3 days, then 150 mg daily Continue Abilify 2 mg at night  Next appointment- 12/15 at 2 PM

## 2021-08-13 ENCOUNTER — Other Ambulatory Visit: Payer: Self-pay | Admitting: Family Medicine

## 2021-08-13 DIAGNOSIS — K219 Gastro-esophageal reflux disease without esophagitis: Secondary | ICD-10-CM

## 2021-08-15 ENCOUNTER — Other Ambulatory Visit: Payer: Self-pay

## 2021-08-15 ENCOUNTER — Encounter: Payer: Self-pay | Admitting: Oncology

## 2021-08-15 ENCOUNTER — Ambulatory Visit: Payer: Medicare Other | Attending: Neurology | Admitting: Physical Therapy

## 2021-08-15 VITALS — BP 108/48 | HR 81

## 2021-08-15 DIAGNOSIS — M6281 Muscle weakness (generalized): Secondary | ICD-10-CM | POA: Insufficient documentation

## 2021-08-15 DIAGNOSIS — M5440 Lumbago with sciatica, unspecified side: Secondary | ICD-10-CM | POA: Diagnosis not present

## 2021-08-15 DIAGNOSIS — G8929 Other chronic pain: Secondary | ICD-10-CM | POA: Diagnosis not present

## 2021-08-15 DIAGNOSIS — R2681 Unsteadiness on feet: Secondary | ICD-10-CM | POA: Insufficient documentation

## 2021-08-15 NOTE — Therapy (Addendum)
[facility] [address] [place], [address] Phone: [phone]   Fax:  [phone]  Physical Therapy Evaluation  Patient Details  Name: Monique [name] MRN: [id number] Date of Birth: 13-Nov-1970 Referring Provider (PT): Gurney Maxin, MD   Encounter Date: 08/15/2021   PT End of Session  Row Name 08/15/21 1333      PT Visits / Re-Eval  Visit Number 1      Number of Visits 16      Date for PT Re-Evaluation 10/10/21      PT Time Calculation  PT Start Time 3244      PT Stop Time 1230      PT Time Calculation (min) 45 min      PT - End of Session  Equipment Utilized During Treatment Gait belt      Activity Tolerance Patient tolerated treatment well      Behavior During Therapy [facility] for tasks assessed/performed        Past Medical History:  Diagnosis Date   Anxiety    Bursitis    leg pain   Carpal tunnel syndrome    Cervical dysplasia    hx LEEP over 18 years ago.    Chronic lymphocytic leukemia ([place]) 2018   Dr Grayland Ormond.  (in lymph nodes)   COPD (chronic obstructive pulmonary disease) ([place])    COVID-19 2021   Depression    Eating disorder    GERD (gastroesophageal reflux disease)    History of self-harm    Insomnia    Obsession    Tobacco abuse    Vitamin B12 deficiency (non anemic)     Past Surgical History:  Procedure Laterality Date   BREAST BIOPSY Right 01/05/2018   US guided biopsy of 2 areas and 1 lymph node, MIXED INFLAMMATION AND GIANT CELL REACTION   CERVICAL BIOPSY  W/ LOOP ELECTRODE EXCISION     COLONOSCOPY WITH PROPOFOL N/A 04/21/2019   Procedure: COLONOSCOPY WITH PROPOFOL;  Surgeon: Virgel Manifold, MD;  Location: [facility] ENDOSCOPY;  Service: Gastroenterology;  Laterality: N/A;   OTHER SURGICAL HISTORY     scar tissue removed from vocal cords   TUBAL LIGATION     VAGINAL HYSTERECTOMY N/A 01/22/2021   Procedure: HYSTERECTOMY VAGINAL; BILATERAL SALPINGECTOMY;  Surgeon:  Rubie Maid, MD;  Location: [facility] ORS;  Service: Gynecology;  Laterality: N/A;   vocal cord surgery  2005    Vitals:   08/15/21 1149  BP: (!) 108/48  Pulse: 81  SpO2: 100%      Subjective Assessment - 08/15/21 1149     Subjective Pt reports near falls when walking around her home. She suddenly loses balance to her side. This is not accompanied by pain. This has been going on for several months. She cannot lay down on her back because of increase low back pain and must sleep on her sides.    Pertinent History TINGLING/ NUMBNESS  - Ongoing.  - Patient with numbness and tingling in hands and feet. Having some imbalance. Also with chronic back pain, receiving injections with pain clinic.  - EMG uppers showed moderate right and mild left carpal tunnel syndrome.  - Reviewed EMG lowers showing neuropathy.  - Imbalance likely due to sensory ataxia.  - Start wearing carpal tunnel braces nightly.  - Call if you want referral for carpal tunnel injections; patient declines today.   - Continue to follow with pain management.  - Start doing balance exercises for 5 minutes  a day.  - Referral to PT for gait and balance and for back pain.    Limitations Walking;Sitting    How long can you sit comfortably? Needs to extend knees otherwise has increased pain.    How long can you stand comfortably? Not for a long period of time. Feels burning sensation and tingling sensation through her legs. Feels cramping too.    How long can you walk comfortably? For short distances. She does have a cane or something to hold onto like cart, etc.    Diagnostic tests 06/02/20  EXAM: MRI LUMBAR SPINE WITHOUT CONTRAST   IMPRESSION:   1. 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 Also there is mild bilateral neural foraminal narrowing and mild central canal stenosis.   2. Stable  retroperitoneal lymphadenopathy.   3. Diffuse marrow changes most consistent with marrow reconversion, stable from prior exam.    Patient Stated Goals Little less pain in her low back.    Currently in Pain? Yes    Pain Score 10-Worst pain ever    Pain Location Back    Pain Orientation Right;Left    Pain Descriptors / Indicators Aching;Burning;Cramping    Pain Type Chronic pain    Pain Radiating Towards Towards both feet    Pain Onset More than a month ago    Pain Frequency Intermittent    Aggravating Factors  More activity or increased activity.    Pain Relieving Factors Elevating her legs or rubbing her legs. Meds help somewhat    Effect of Pain on Daily Activities Some days she literally cannot do anything                [facility] PT Assessment - 08/15/21 0001       Assessment   Medical Diagnosis Low Back Pain    Referring Provider (PT) Gurney Maxin, MD    Onset Date/Surgical Date 09/17/15    Prior Therapy Yes      Precautions   Precautions None      Restrictions   Weight Bearing Restrictions No      Balance Screen   Has the patient fallen in the past 6 months No    Has the patient had a decrease in activity level because of a fear of falling?  Yes    Is the patient reluctant to leave their home because of a fear of falling?  Yes      [place] Private residence    Living Arrangements Children    Type of [place] to enter    Entrance Stairs-Number of Steps 3    Entrance Stairs-Rails Right    [place] One level      Prior Function   Level of Independence Independent    Vocation On disability      Cognition   Overall Cognitive Status Within Functional Limits for tasks assessed            LOW BACK EVALUATION   - Cauda Equina Syndrome: Bladder/bowel dysfunction- Negative  Saddle anesthesia - Numbness and tingling  Sexual dysfunction- Negative  Possible neurological deficits in the lower limb (motor  or sensory loss, reflex change)   SENSATION: Numbness and tingling over lateral side of hip to knee along L2 and L3 dermatomes   AROM (in standing):  Lumbar flexion: 100%                ext: 100%                SB R/L: 100%/ 100%                Rot R/L: 100%/ 100%   AROM:                   R      L          [place]           Hip flexion:  120    120         120             ER:             45    45              45              IR:              20     20             45   AROM:                   R      L          [place]           Hip flexion:  120    120         120             ER:             45    45              45              IR:              20     20             45   Strength:              R     L                     Hip flex:      5/5     5/5               abd:        4/5     4/5               add:        4-/5    4-/5         Knee flex:    5/5    5/5               ext:        4/5     4/5          Ankle DF:   5/5     5/5     Great toe ext:    5/5     5/5   Symptom response to: No directional preference   Special Tests:          Slump test: Negative          SLR: Negative, muscular restriction at 70 deg hip  flex         FABERS: Negative          FADDIR: Negative          Thigh thrust: NT          Thomas Test: +B            Ely's Test: + B   *= Painful   THEREX:  Prone Quad Stretch 2 x 30 sec  Supine Bridges 1 x 10        Plan - 08/15/21 1718     Clinical Impression Statement Pt is a 50 yo female that presents for initial eval for chronic low back pain and unsteadiness. Pt demonstrates signs and symptoms that best classify her in the movement control treatment group for low back pain with general low to moderate pain and a stable symptom status. She has decreased hip strength and flexibility and unsteadiness that still has to be further examined during next visit. Lumbar radiculopathy ruled out during visit. She will benefit from skilled PT to improve her  balance and hip strength and flexibility and to decrease her low back pain, so that she can manage her pain to return ADL.    Personal Factors and Comorbidities Comorbidity 3+    Comorbidities CLL, PTSD, Neuropathy    Examination-Activity Limitations Stand;Stairs;Locomotion Level;Carry    Examination-Participation Restrictions Community Activity;Shop;Cleaning    Stability/Clinical Decision Making Stable/Uncomplicated    Clinical Decision Making Low    Rehab Potential Fair    PT Frequency 2x / week    PT Duration 8 weeks    PT Treatment/Interventions ADLs/Self Care Home Management;Aquatic Therapy;Electrical Stimulation;Cryotherapy;Moist Heat;Balance training;Neuromuscular re-education;Patient/family education;Dry needling;Passive range of motion;Stair training;Gait training;DME Instruction;Manual techniques;Joint Manipulations;Spinal Manipulations;Vestibular;Therapeutic exercise;Therapeutic activities    PT Next Visit Plan Finish eval with balance testing    PT Home Exercise Plan [place] and Agree with Plan of Care Patient             08/15/21 1747  PT SHORT TERM GOAL #1  Title Patient will demonstrate understanding of home exercise plan.  Baseline 11/30: NT  Time 2  Period Weeks  Status New  Target Date 08/29/21     08/15/21 1645  PT LONG TERM GOAL #1  Title Patient will have improved function and activity level as evidenced by an increase in his FOTO score to predicted score of 55.  Baseline 08/15/21: 52/55  Time 8  Period Weeks  Status New  Target Date 10/10/21  PT LONG TERM GOAL #2  Title Patient will improve her hip strength to 5/5 in order to improve low back pain and improve steadiness with gait.  Baseline 08/15/21: Hip Abduction R/L 4/4, Hip Adduction R/L 4/4 , Hip Ext R/L 4/4  Time 8  Period Weeks  Status New  Target Date 10/10/21  PT LONG TERM GOAL #3  Title Patient will score >=22 to demonstrate that she has balance that is within norms for her  age.  Baseline 11/30: NT  Time 8  Period Weeks    HEP includes the following:  Access Code: [email or web address]: [email or web address] Date: 08/15/2021 Prepared by: Bradly Chris  Exercises Prone Quadriceps Stretch with Strap - 1 x daily - 7 x weekly - 1 sets - 3 reps - 30 hold Supine Bridge - 1 x daily - 3 x weekly - 3 sets - 10 reps   Patient will benefit from skilled therapeutic intervention in order to improve the following deficits and impairments:  Abnormal gait, Pain, Impaired sensation, Impaired flexibility, Increased muscle spasms, Decreased strength, Decreased mobility, Decreased balance  Visit Diagnosis: Chronic bilateral low back pain with sciatica, sciatica laterality unspecified - Plan: PT plan of care cert/re-cert  Muscle weakness (generalized) - Plan: PT plan of care cert/re-cert  Unsteadiness on feet - Plan: PT plan of care cert/re-cert     Problem List Patient Active Problem List   Diagnosis Date Noted   Lumbar foraminal stenosis (L4-5) (Bilateral) (R>L) 06/05/2021   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 06/05/2021   Lumbosacral radiculopathy/radiculitis at L5 (Bilateral) 06/05/2021   PAD (peripheral artery disease) ([place]) 02/14/2021   CIN II (cervical intraepithelial neoplasia II) 02/01/2021   S/P vaginal hysterectomy 01/22/2021   Acute right hip pain 06/05/2020   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/09/2020   Bilateral leg pain 11/03/2019   Iron deficiency anemia 10/15/2019   Bilateral arm pain 08/05/2019   Bilateral hand numbness 08/05/2019   Weakness of both hands 08/05/2019   Chronic musculoskeletal pain 07/19/2019   Chronic neuropathic pain 07/19/2019   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   Special screening for malignant neoplasms, colon    Polyp of sigmoid colon    Hemorrhoids    Trigeminal neuralgia 10/05/2018   Lumbar radiculitis (Right) 09/24/2018   Hypocalcemia 06/03/2018   Vitamin D  insufficiency 06/03/2018   Abnormal MRI, lumbar spine (06/02/2020) 06/03/2018   Chronic hip pain (Right) 06/03/2018   Spondylosis without myelopathy or radiculopathy, lumbar region 06/03/2018   DDD (degenerative disc disease), lumbar 06/02/2018   Lumbar facet hypertrophy 06/02/2018   Lumbar facet arthropathy 06/02/2018   Lumbar facet syndrome (Bilateral) (R>L) 06/02/2018   Marijuana use 05/19/2018   Chronic lower extremity pain (1ry area of Pain) (Bilateral) (R>L) 05/13/2018   Chronic low back pain (2ry area of Pain) (Bilateral) (R>L) w/ sciatica (Bilateral) 05/13/2018   Chronic pain syndrome 05/13/2018   Disorder of skeletal system 05/13/2018   Pharmacologic therapy 05/13/2018   Problems influencing health status 05/13/2018   Mastalgia 05/05/2018   Breast mass, right 01/07/2018   Face pain 12/11/2017   Tingling 12/11/2017   Thoracic aortic atherosclerosis ([place]) 08/20/2017   Emphysema of lung ([place]) 08/20/2017   Adductor tendinitis 04/23/2017   Trochanteric bursitis of right hip 04/23/2017   GERD without esophagitis 12/11/2016   CLL (chronic lymphocytic leukemia) ([place]) 11/24/2016   Pap smear abnormality of cervix/human papillomavirus (HPV) positive 09/12/2016   Stress incontinence 09/04/2016   B12 deficiency 07/10/2015   Insomnia, persistent 07/10/2015   Anorexia nervosa, restricting type 07/10/2015   Anxiety, generalized 07/10/2015   H/O suicide attempt 07/10/2015   Lymphocytosis 07/10/2015   Obsessive-compulsive disorder 07/10/2015   Tobacco use 07/10/2015   History of cervical dysplasia 07/18/2014   Bradly Chris PT, DPT  08/15/2021, 5:48 PM  [facility] [address], [place], [address] Phone: [phone]   Fax:  [phone]  Name: Lakindra Wible MRN: [id number] Date of Birth: Oct 24, 1970

## 2021-08-15 NOTE — Progress Notes (Signed)
   08/15/21 1645  PT LONG TERM GOAL #1  Title Patient will have improved function and activity level as evidenced by an increase in his FOTO score to predicted score of 55.  Baseline 08/15/21: 52/55  Time 8  Period Weeks  Status New  Target Date 10/10/21  PT LONG TERM GOAL #2  Title Patient will improve her hip strength to 5/5 in order to improve low back pain and improve steadiness with gait.  Baseline 08/15/21: Hip Abduction R/L 4/4, Hip Adduction R/L 4/4 , Hip Ext R/L 4/4  Time 8  Period Weeks  Status New  Target Date 10/10/21  PT LONG TERM GOAL #3  Title Patient will score >=22 to demonstrate that she has balance that is within norms for her age.  Baseline 11/30: NT  Time 8  Period Weeks

## 2021-08-15 NOTE — Progress Notes (Signed)
   08/15/21 1747  PT SHORT TERM GOAL #1  Title Patient will demonstrate understanding of home exercise plan.  Baseline 11/30: NT  Time 2  Period Weeks  Status New  Target Date 08/29/21

## 2021-08-16 ENCOUNTER — Other Ambulatory Visit (HOSPITAL_COMMUNITY): Payer: Self-pay

## 2021-08-20 ENCOUNTER — Ambulatory Visit: Payer: Medicare Other | Attending: Neurology | Admitting: Physical Therapy

## 2021-08-20 DIAGNOSIS — M5440 Lumbago with sciatica, unspecified side: Secondary | ICD-10-CM | POA: Insufficient documentation

## 2021-08-20 DIAGNOSIS — R2681 Unsteadiness on feet: Secondary | ICD-10-CM

## 2021-08-20 DIAGNOSIS — M5441 Lumbago with sciatica, right side: Secondary | ICD-10-CM

## 2021-08-20 DIAGNOSIS — G8929 Other chronic pain: Secondary | ICD-10-CM | POA: Diagnosis not present

## 2021-08-20 DIAGNOSIS — M6281 Muscle weakness (generalized): Secondary | ICD-10-CM

## 2021-08-20 NOTE — Therapy (Signed)
[facility] [address] [place], [address] Phone: [phone]   Fax:  [phone]  Physical Therapy Treatment  Patient Details  Name: Monique [name] MRN: [id number] Date of Birth: Jun 04, 1971 Referring Provider (PT): Gurney Maxin, MD   Encounter Date: 08/20/2021   PT End of Session - 08/20/21 1628     Visit Number 2    Number of Visits 16    Date for PT Re-Evaluation 10/10/21    PT Start Time 1330    PT Stop Time 1415    PT Time Calculation (min) 45 min    Equipment Utilized During Treatment Gait belt    Activity Tolerance Patient tolerated treatment well    Behavior During Therapy WFL for tasks assessed/performed             Past Medical History:  Diagnosis Date   Anxiety    Bursitis    leg pain   Carpal tunnel syndrome    Cervical dysplasia    hx LEEP over 18 years ago.    Chronic lymphocytic leukemia ([place]) 2018   Dr Grayland Ormond.  (in lymph nodes)   COPD (chronic obstructive pulmonary disease) ([place])    COVID-19 2021   Depression    Eating disorder    GERD (gastroesophageal reflux disease)    History of self-harm    Insomnia    Obsession    Tobacco abuse    Vitamin B12 deficiency (non anemic)     Past Surgical History:  Procedure Laterality Date   BREAST BIOPSY Right 01/05/2018   US guided biopsy of 2 areas and 1 lymph node, MIXED INFLAMMATION AND GIANT CELL REACTION   CERVICAL BIOPSY  W/ LOOP ELECTRODE EXCISION     COLONOSCOPY WITH PROPOFOL N/A 04/21/2019   Procedure: COLONOSCOPY WITH PROPOFOL;  Surgeon: Virgel Manifold, MD;  Location: [facility];  Service: Gastroenterology;  Laterality: N/A;   OTHER SURGICAL HISTORY     scar tissue removed from vocal cords   TUBAL LIGATION     VAGINAL HYSTERECTOMY N/A 01/22/2021   Procedure: HYSTERECTOMY VAGINAL; BILATERAL SALPINGECTOMY;  Surgeon: Rubie Maid, MD;  Location: [facility] ORS;  Service: Gynecology;  Laterality: N/A;   vocal  cord surgery  2005    There were no vitals filed for this visit.   Subjective Assessment - 08/20/21 1334     Subjective Pt reports increased LE pain earlier today and she lost her balance a couple of times. Losses balance by swaying to the side and she has to catch herself on a counter.    Pertinent History TINGLING/ NUMBNESS  - Ongoing.  - Patient with numbness and tingling in hands and feet. Having some imbalance. Also with chronic back pain, receiving injections with pain clinic.  - EMG uppers showed moderate right and mild left carpal tunnel syndrome.  - Reviewed EMG lowers showing neuropathy.  - Imbalance likely due to sensory ataxia.  - Start wearing carpal tunnel braces nightly.  - Call if you want referral for carpal tunnel injections; patient declines today.   - Continue to follow with pain management.  - Start doing balance exercises for 5 minutes a day.  - Referral to PT for gait and balance and for back pain.    Limitations Walking;Sitting    How long can you sit comfortably? Needs to extend knees otherwise has increased pain.    How long can you stand comfortably? Not for a long period of time. Feels burning sensation  and tingling sensation through her legs. Feels cramping too.    How long can you walk comfortably? For short distances. She does have a cane or something to hold onto like cart, etc.    Diagnostic tests 06/02/20  EXAM: MRI LUMBAR SPINE WITHOUT CONTRAST   IMPRESSION:   1. 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 Also there is mild bilateral neural foraminal narrowing and mild central canal stenosis.   2. Stable retroperitoneal lymphadenopathy.   3. Diffuse marrow changes most consistent with marrow reconversion, stable from prior exam.    Patient Stated Goals Little less pain in her low back.    Currently in Pain? Yes     Pain Score 2     Pain Location Back    Pain Orientation Right;Left    Pain Descriptors / Indicators Aching;Burning;Cramping    Pain Type Chronic pain    Pain Radiating Towards Towards both feet    Pain Onset More than a month ago    Pain Frequency Intermittent            THEREX:   Dynamic Gait Index: Total Score 21/24 >22 = Safe Ambulator  Deficits include the following:  -Mild to Moderate Impairment for Steps; uses two feet per step when feeling unsteady, but can perform step over step when BLE are not weak  -Gait with vertical head turns; Mild Impairment: performs head turns smoothly with slight change in gait velocity.    Clinical Test of Sensory Interaction for Balance    (CTSIB):  CONDITION TIME STRATEGY SWAY  Eyes open, firm surface 30 seconds ankle Mild   Eyes closed, firm surface 30 seconds hip Moderate   Eyes open, foam surface 30 seconds ankle Mild   Eyes closed, foam surface 30 seconds hip            Moderate    Seated Isometric Hip Adduction 2 x 10 x 30 sec  Sidelying Hip Abduction 2 x 10  Side Step Down 2 x 10 with use of UE support    Updated HEP and educated patient on changes to exercises and addition of new exercises to include sidelying hip abduction and seated isometric hip adduction.                 PT Education - 08/20/21 1337     Education Details form and technique for appropriate exercise    Person(s) Educated Patient    Methods Explanation;Demonstration;Verbal cues;Handout    Comprehension Verbalized understanding;Returned demonstration;Verbal cues required              PT Short Term Goals - 08/20/21 1405       PT SHORT TERM GOAL #1   Title Patient will demonstrate understanding of home exercise plan.    Baseline 11/30: NT    Time 2    Period Weeks    Status New    Target Date 08/29/21               PT Long Term Goals - 08/20/21 1634       PT LONG TERM GOAL #1   Title Patient will have improved  function and activity level as evidenced by an increase in his FOTO score to predicted score of 55.    Baseline 08/15/21: 52/55    Time 8    Period Weeks    Status New  PT LONG TERM GOAL #2   Title Patient will improve her hip strength to 5/5 in order to improve low back pain and improve steadiness with gait.    Baseline 08/15/21: Hip Abduction R/L 4/4, Hip Adduction R/L 4/4 , Hip Ext R/L 4/4    Time 8    Period Weeks    Status On-going    Target Date 10/10/21      PT LONG TERM GOAL #3   Title Patient will score >=22 on DGI to demonstrate that she has balance that is within norms for her age.    Baseline 11/30: NT 12/5: 21/24    Time 8    Period Weeks    Status On-going    Target Date 10/10/21                   Plan - 08/20/21 1401     Clinical Impression Statement Pt presents for f/u for imbalance and LE pain and weakness in the setting of lumbar stenosis. Vestibular, visual, and somatosensory impairments as imbalance generators ruled out during visit with DGI performance and MCTSIB being close to age and gender related norms. The imbalance appears to be more related to onset of stenosis with it's sudden onset, LE weakness that is difficult reproduce, and general discomfort with lumbar extension positions. Pt will benefit from further neurological testing with finger to nose test and RAMS next session to rule out further neurological symptoms. She is highly motivated and she was able to complete all exercises without an increase in her LBP and LE weakness.  She will continue to benefit from skilled PT to improve her balance and hip strength and flexibility and to decrease her low back pain, so that she can manage her pain to return ADL.    Personal Factors and Comorbidities Comorbidity 3+    Comorbidities CLL, PTSD, Neuropathy    Examination-Activity Limitations Stand;Stairs;Locomotion Level;Carry    Examination-Participation Restrictions Community Activity;Shop;Cleaning     Stability/Clinical Decision Making Stable/Uncomplicated    Clinical Decision Making Low    Rehab Potential Fair    PT Frequency 2x / week    PT Duration 8 weeks    PT Treatment/Interventions ADLs/Self Care Home Management;Aquatic Therapy;Electrical Stimulation;Cryotherapy;Moist Heat;Balance training;Neuromuscular re-education;Patient/family education;Dry needling;Passive range of motion;Stair training;Gait training;DME Instruction;Manual techniques;Joint Manipulations;Spinal Manipulations;Vestibular;Therapeutic exercise;Therapeutic activities    PT Next Visit Plan Finger to nose test. RAMs. Futher determination if extension based positions are provoking. Progression of hip strengthening exercises    PT Home Exercise Plan [email or web address]    Consulted and Agree with Plan of Care Patient            HEP includes the following:   Access Code: [email or web address] URL: [email or web address] Date: 08/20/2021 Prepared by: Bradly Chris  Exercises Prone Quadriceps Stretch with Strap - 1 x daily - 7 x weekly - 1 sets - 3 reps - 30 hold Supine Bridge - 1 x daily - 3 x weekly - 3 sets - 10 reps Seated Hip Adduction Isometrics with Ball - 1 x daily - 3 x weekly - 3 sets - 10 reps - 5 hold Sidelying Hip Abduction - 1 x daily - 3 x weekly - 3 sets - 10 reps Lateral Step Down - 1 x daily - 3 x weekly - 2 sets - 10 reps  Patient will benefit from skilled therapeutic intervention in order to improve the following deficits and impairments:  Abnormal gait, Pain, Impaired sensation, Impaired flexibility, Increased muscle spasms, Decreased strength, Decreased mobility, Decreased  balance  Visit Diagnosis: Chronic bilateral low back pain with sciatica, sciatica laterality unspecified  Muscle weakness (generalized)  Unsteadiness on feet     Problem List Patient Active Problem List   Diagnosis Date Noted   Lumbar foraminal stenosis (L4-5) (Bilateral) (R>L) 06/05/2021   Lumbar central spinal  stenosis (L4-5) 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 06/05/2021   Lumbosacral radiculopathy/radiculitis at L5 (Bilateral) 06/05/2021   PAD (peripheral artery disease) ([place]) 02/14/2021   CIN II (cervical intraepithelial neoplasia II) 02/01/2021   S/P vaginal hysterectomy 01/22/2021   Acute right hip pain 06/05/2020   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/09/2020   Bilateral leg pain 11/03/2019   Iron deficiency anemia 10/15/2019   Bilateral arm pain 08/05/2019   Bilateral hand numbness 08/05/2019   Weakness of both hands 08/05/2019   Chronic musculoskeletal pain 07/19/2019   Chronic neuropathic pain 07/19/2019   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   Special screening for malignant neoplasms, colon    Polyp of sigmoid colon    Hemorrhoids    Trigeminal neuralgia 10/05/2018   Lumbar radiculitis (Right) 09/24/2018   Hypocalcemia 06/03/2018   Vitamin D insufficiency 06/03/2018   Abnormal MRI, lumbar spine (06/02/2020) 06/03/2018   Chronic hip pain (Right) 06/03/2018   Spondylosis without myelopathy or radiculopathy, lumbar region 06/03/2018   DDD (degenerative disc disease), lumbar 06/02/2018   Lumbar facet hypertrophy 06/02/2018   Lumbar facet arthropathy 06/02/2018   Lumbar facet syndrome (Bilateral) (R>L) 06/02/2018   Marijuana use 05/19/2018   Chronic lower extremity pain (1ry area of Pain) (Bilateral) (R>L) 05/13/2018   Chronic low back pain (2ry area of Pain) (Bilateral) (R>L) w/ sciatica (Bilateral) 05/13/2018   Chronic pain syndrome 05/13/2018   Disorder of skeletal system 05/13/2018   Pharmacologic therapy 05/13/2018   Problems influencing health status 05/13/2018   Mastalgia 05/05/2018   Breast mass, right 01/07/2018   Face pain 12/11/2017   Tingling 12/11/2017   Thoracic aortic atherosclerosis ([place]) 08/20/2017   Emphysema of lung ([place]) 08/20/2017   Adductor tendinitis 04/23/2017   Trochanteric bursitis of right hip 04/23/2017   GERD without esophagitis 12/11/2016   CLL  (chronic lymphocytic leukemia) ([place]) 11/24/2016   Pap smear abnormality of cervix/human papillomavirus (HPV) positive 09/12/2016   Stress incontinence 09/04/2016   B12 deficiency 07/10/2015   Insomnia, persistent 07/10/2015   Anorexia nervosa, restricting type 07/10/2015   Anxiety, generalized 07/10/2015   H/O suicide attempt 07/10/2015   Lymphocytosis 07/10/2015   Obsessive-compulsive disorder 07/10/2015   Tobacco use 07/10/2015   History of cervical dysplasia 07/18/2014   Bradly Chris PT, DPT   08/20/2021, 4:37 PM  [facility] [address] [place], [address] Phone: [phone]   Fax:  [phone]  Name: Monique [name] MRN: [id number] Date of Birth: 10-16-1970

## 2021-08-22 ENCOUNTER — Other Ambulatory Visit: Payer: Self-pay | Admitting: Family Medicine

## 2021-08-22 ENCOUNTER — Ambulatory Visit: Payer: Medicare Other | Admitting: Physical Therapy

## 2021-08-22 DIAGNOSIS — M7918 Myalgia, other site: Secondary | ICD-10-CM

## 2021-08-22 DIAGNOSIS — M5441 Lumbago with sciatica, right side: Secondary | ICD-10-CM

## 2021-08-22 DIAGNOSIS — G8929 Other chronic pain: Secondary | ICD-10-CM
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
## 2021-08-27 ENCOUNTER — Telehealth: Payer: Self-pay | Admitting: Physical Therapy

## 2021-08-27 ENCOUNTER — Ambulatory Visit: Payer: Medicare Other | Admitting: Physical Therapy

## 2021-08-27 NOTE — Telephone Encounter (Signed)
Called pt to inquiry about her absence. Did not reach so VM left instructing her to call back to reschedule.

## 2021-08-30 ENCOUNTER — Telehealth: Payer: Self-pay | Admitting: Psychiatry

## 2021-08-30 ENCOUNTER — Ambulatory Visit: Payer: Medicare Other | Admitting: Physical Therapy

## 2021-08-30 ENCOUNTER — Other Ambulatory Visit: Payer: Self-pay

## 2021-08-30 ENCOUNTER — Telehealth: Payer: Medicare Other | Admitting: Psychiatry

## 2021-08-30 ENCOUNTER — Telehealth: Payer: Self-pay | Admitting: Physical Therapy

## 2021-08-30 NOTE — Telephone Encounter (Signed)
Called pt to inquiry about absence. Did not reach pt so left VM instructing pt to call back to check in with clinic and that 3 No Shows result in a removal from clinic schedule.

## 2021-08-30 NOTE — Telephone Encounter (Signed)
Sent link for video visit through Epic. Patient did not sign in. Called the patient for appointment scheduled today. The patient did not answer the phone. Left voice message to contact the office ([phone]).   ?

## 2021-09-03 ENCOUNTER — Other Ambulatory Visit: Payer: Self-pay | Admitting: Pharmacist

## 2021-09-03 ENCOUNTER — Other Ambulatory Visit (HOSPITAL_COMMUNITY): Payer: Self-pay

## 2021-09-03 ENCOUNTER — Ambulatory Visit: Payer: Medicare Other | Admitting: Physical Therapy

## 2021-09-03 DIAGNOSIS — C9110 Chronic lymphocytic leukemia of B-cell type not having achieved remission: Secondary | ICD-10-CM

## 2021-09-03 MED ORDER — IMBRUVICA 420 MG PO TABS
420.0000 mg | ORAL_TABLET | Freq: Every day | ORAL | 5 refills | Status: DC
  Filled 2021-09-03: qty 28, 28d supply, fill #0
  Filled 2021-09-28: qty 28, 28d supply, fill #1
  Filled 2021-10-23: qty 28, 28d supply, fill #2
  Filled 2021-11-23: qty 28, 28d supply, fill #3
  Filled 2021-12-20: qty 28, 28d supply, fill #4
  Filled 2022-01-15: qty 28, 28d supply, fill #5

## 2021-09-05 ENCOUNTER — Telehealth: Payer: Self-pay | Admitting: Physical Therapy

## 2021-09-05 ENCOUNTER — Ambulatory Visit: Payer: Medicare Other | Admitting: Physical Therapy

## 2021-09-05 NOTE — Telephone Encounter (Signed)
Called pt to check in about whether she would make apt today. She did not pickup and could not leave VM.

## 2021-09-06 ENCOUNTER — Other Ambulatory Visit (HOSPITAL_COMMUNITY): Payer: Self-pay

## 2021-09-11 ENCOUNTER — Ambulatory Visit: Payer: Medicare Other | Admitting: Physical Therapy

## 2021-09-11 DIAGNOSIS — M5440 Lumbago with sciatica, unspecified side: Secondary | ICD-10-CM

## 2021-09-11 DIAGNOSIS — G8929 Other chronic pain: Secondary | ICD-10-CM | POA: Diagnosis not present

## 2021-09-11 DIAGNOSIS — R2681 Unsteadiness on feet: Secondary | ICD-10-CM

## 2021-09-11 DIAGNOSIS — M6281 Muscle weakness (generalized): Secondary | ICD-10-CM | POA: Diagnosis not present

## 2021-09-11 NOTE — Therapy (Signed)
[facility] [address], [place], [address] Phone: [phone]   Fax:  [phone]  Physical Therapy Treatment  Patient Details  Name: Monique [name] MRN: [id number] Date of Birth: 1971/06/23 Referring Provider (PT): Gurney Maxin, MD   Encounter Date: 09/11/2021   PT End of Session - 09/11/21 1325     Visit Number 3    Number of Visits 16    Date for PT Re-Evaluation 10/10/21    PT Start Time 1105    PT Stop Time 1145    PT Time Calculation (min) 40 min    Equipment Utilized During Treatment Gait belt    Activity Tolerance Patient tolerated treatment well    Behavior During Therapy WFL for tasks assessed/performed             Past Medical History:  Diagnosis Date   Anxiety    Bursitis    leg pain   Carpal tunnel syndrome    Cervical dysplasia    hx LEEP over 18 years ago.    Chronic lymphocytic leukemia ([place]) 2018   Dr Grayland Ormond.  (in lymph nodes)   COPD (chronic obstructive pulmonary disease) ([place])    COVID-19 2021   Depression    Eating disorder    GERD (gastroesophageal reflux disease)    History of self-harm    Insomnia    Obsession    Tobacco abuse    Vitamin B12 deficiency (non anemic)     Past Surgical History:  Procedure Laterality Date   BREAST BIOPSY Right 01/05/2018   US guided biopsy of 2 areas and 1 lymph node, MIXED INFLAMMATION AND GIANT CELL REACTION   CERVICAL BIOPSY  W/ LOOP ELECTRODE EXCISION     COLONOSCOPY WITH PROPOFOL N/A 04/21/2019   Procedure: COLONOSCOPY WITH PROPOFOL;  Surgeon: Virgel Manifold, MD;  Location: [facility];  Service: Gastroenterology;  Laterality: N/A;   OTHER SURGICAL HISTORY     scar tissue removed from vocal cords   TUBAL LIGATION     VAGINAL HYSTERECTOMY N/A 01/22/2021   Procedure: HYSTERECTOMY VAGINAL; BILATERAL SALPINGECTOMY;  Surgeon: Rubie Maid, MD;  Location: [facility] ORS;  Service: Gynecology;  Laterality: N/A;   vocal  cord surgery  2005    There were no vitals filed for this visit.   Subjective Assessment - 09/11/21 1107     Subjective Pt reports that she has been using elliptical for exercise. She is experiencing increased pain in bottom of feet and she has stiffness in her right knee.    Pertinent History TINGLING/ NUMBNESS  - Ongoing.  - Patient with numbness and tingling in hands and feet. Having some imbalance. Also with chronic back pain, receiving injections with pain clinic.  - EMG uppers showed moderate right and mild left carpal tunnel syndrome.  - Reviewed EMG lowers showing neuropathy.  - Imbalance likely due to sensory ataxia.  - Start wearing carpal tunnel braces nightly.  - Call if you want referral for carpal tunnel injections; patient declines today.   - Continue to follow with pain management.  - Start doing balance exercises for 5 minutes a day.  - Referral to PT for gait and balance and for back pain.    Limitations Walking;Sitting    How long can you sit comfortably? Needs to extend knees otherwise has increased pain.    How long can you stand comfortably? Not for a long period of time. Feels burning sensation and tingling sensation through her  legs. Feels cramping too.    How long can you walk comfortably? For short distances. She does have a cane or something to hold onto like cart, etc.    Diagnostic tests 06/02/20  EXAM: MRI LUMBAR SPINE WITHOUT CONTRAST   IMPRESSION:   1. 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 Also there is mild bilateral neural foraminal narrowing and mild central canal stenosis.   2. Stable retroperitoneal lymphadenopathy.   3. Diffuse marrow changes most consistent with marrow reconversion, stable from prior exam.    Patient Stated Goals Little less pain in her low back.    Currently in Pain? No/denies    Pain  Onset More than a month ago             THEREX:   Finger to Nose Test: Negative  RAM BUEs: Negative  BLE: Negative  Heel Slide: Negative    A[facility], Right, and Left 3 x 30   Seated Quad Stretch 3 x 60 sec   Mini-Squats 3 x 10  -min/mod VC to maintain knee behind toes   Abdominal Bicycles with hip and knee 90 and opposite arm to leg 3 x 10    Updated HEP and educated patient on changes to exercises and addition of new exercises abdominal bicycle crunches, mini-squats, seated quad stretch.              PT Education - 09/11/21 1108     Education Details form and technique for appropriate exercise    Person(s) Educated Patient    Methods Explanation;Demonstration;Verbal cues;Handout    Comprehension Verbalized understanding;Returned demonstration;Verbal cues required              PT Short Term Goals - 09/11/21 1110       PT SHORT TERM GOAL #1   Title Patient will demonstrate understanding of home exercise plan.    Baseline 11/30: NT    Time 2    Period Weeks    Status On-going    Target Date 08/29/21               PT Long Term Goals - 09/11/21 1110       PT LONG TERM GOAL #1   Title Patient will have improved function and activity level as evidenced by an increase in his FOTO score to predicted score of 55.    Baseline 08/15/21: 52/55    Time 8    Period Weeks    Status New      PT LONG TERM GOAL #2   Title Patient will improve her hip strength to 5/5 in order to improve low back pain and improve steadiness with gait.    Baseline 08/15/21: Hip Abduction R/L 4/4, Hip Adduction R/L 4/4 , Hip Ext R/L 4/4    Time 8    Period Weeks    Status On-going    Target Date 10/10/21      PT LONG TERM GOAL #3   Title Patient will score >=22 on DGI to demonstrate that she has balance that is within norms for her age.    Baseline 11/30: NT 12/5: 21/24    Time 8    Period Weeks    Status On-going    Target Date 10/10/21                    Plan -  09/11/21 1132     Clinical Impression Statement Pt presents for f/u for imbalance and LE pain and weakness in the setting of lumbar stenosis and neuropathy. Further neuorlogical testing conducted with coordination and dysmetria ruled out. Pt does not exhibit a directional preference that matches stenosis reporting increased neurological symptoms with flexion based exercises. She was able to complete all exercises without an increase in pain and requested a decrease in frequency due to time constraints with family care obligations.  She will continue to benefit from skilled PT to improve her balance and hip strength and flexibility and to decrease her low back pain, so that she can manage her pain to return ADL.    Personal Factors and Comorbidities Comorbidity 3+    Comorbidities CLL, PTSD, Neuropathy    Examination-Activity Limitations Stand;Stairs;Locomotion Level;Carry    Examination-Participation Restrictions Community Activity;Shop;Cleaning    Stability/Clinical Decision Making Stable/Uncomplicated    Rehab Potential Fair    PT Frequency 2x / week    PT Duration 8 weeks    PT Treatment/Interventions ADLs/Self Care Home Management;Aquatic Therapy;Electrical Stimulation;Cryotherapy;Moist Heat;Balance training;Neuromuscular re-education;Patient/family education;Dry needling;Passive range of motion;Stair training;Gait training;DME Instruction;Manual techniques;Joint Manipulations;Spinal Manipulations;Vestibular;Therapeutic exercise;Therapeutic activities    PT Next Visit Plan Progress hip strengthening exercises (SLS) and begin static balance exercises    PT Home Exercise Plan [email or web address]    Consulted and Agree with Plan of Care Patient             HEP includes the following:   Access Code: [email or web address] URL: [email or web address] Date: 09/11/2021 Prepared by: Bradly Chris  Exercises Prone Quadriceps Stretch with Strap - 1 x daily - 7 x weekly - 1  sets - 3 reps - 30 hold Seated Hip Adduction Isometrics with Ball - 1 x daily - 3 x weekly - 3 sets - 10 reps - 5 hold Sidelying Hip Abduction - 1 x daily - 3 x weekly - 3 sets - 10 reps Mini Squat - 1 x daily - 3 x weekly - 3 sets - 10 reps Lateral Step Down - 1 x daily - 3 x weekly - 2 sets - 10 reps Abdominal Crutches with hip and knee at 90 3 x 10      Patient will benefit from skilled therapeutic intervention in order to improve the following deficits and impairments:  Abnormal gait, Pain, Impaired sensation, Impaired flexibility, Increased muscle spasms, Decreased strength, Decreased mobility, Decreased balance  Visit Diagnosis: Chronic bilateral low back pain with sciatica, sciatica laterality unspecified  Muscle weakness (generalized)  Unsteadiness on feet     Problem List Patient Active Problem List   Diagnosis Date Noted   Lumbar foraminal stenosis (L4-5) (Bilateral) (R>L) 06/05/2021   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   Special screening for malignant neoplasms, colon    Polyp of sigmoid colon    Hemorrhoids    Trigeminal neuralgia 10/05/2018   Lumbar radiculitis (Right) 09/24/2018   Hypocalcemia 06/03/2018  Vitamin D insufficiency 06/03/2018   Abnormal MRI, lumbar spine (06/02/2020) 06/03/2018   Chronic hip pain (Right) 06/03/2018    Spondylosis without myelopathy or radiculopathy, lumbar region 06/03/2018   DDD (degenerative disc disease), lumbar 06/02/2018   Lumbar facet hypertrophy 06/02/2018   Lumbar facet arthropathy 06/02/2018   Lumbar facet syndrome (Bilateral) (R>L) 06/02/2018   Marijuana use 05/19/2018   Chronic lower extremity pain (1ry area of Pain) (Bilateral) (R>L) 05/13/2018   Chronic low back pain (2ry area of Pain) (Bilateral) (R>L) w/ sciatica (Bilateral) 05/13/2018   Chronic pain syndrome 05/13/2018   Disorder of skeletal system 05/13/2018   Pharmacologic therapy 05/13/2018   Problems influencing health status 05/13/2018   Mastalgia 05/05/2018   Breast mass, right 01/07/2018   Face pain 12/11/2017   Tingling 12/11/2017   Thoracic aortic atherosclerosis ([place]) 08/20/2017   Emphysema of lung ([place]) 08/20/2017   Adductor tendinitis 04/23/2017   Trochanteric bursitis of right hip 04/23/2017   GERD without esophagitis 12/11/2016   CLL (chronic lymphocytic leukemia) ([place]) 11/24/2016   Pap smear abnormality of cervix/human papillomavirus (HPV) positive 09/12/2016   Stress incontinence 09/04/2016   B12 deficiency 07/10/2015   Insomnia, persistent 07/10/2015   Anorexia nervosa, restricting type 07/10/2015   Anxiety, generalized 07/10/2015   H/O suicide attempt 07/10/2015   Lymphocytosis 07/10/2015   Obsessive-compulsive disorder 07/10/2015   Tobacco use 07/10/2015   History of cervical dysplasia 07/18/2014   Bradly Chris PT, DPT  09/11/2021, 1:26 PM  [facility] [address], [place], [address] Phone: [phone]   Fax:  [phone]  Name: Monique [name] MRN: [id number] Date of Birth: August 20, 1971

## 2021-09-12 ENCOUNTER — Ambulatory Visit (INDEPENDENT_AMBULATORY_CARE_PROVIDER_SITE_OTHER): Payer: Medicare Other | Admitting: Primary Care

## 2021-09-12 ENCOUNTER — Encounter: Payer: Self-pay | Admitting: Primary Care

## 2021-09-12 VITALS — BP 120/70 | HR 85 | Temp 97.7°F | Ht 67.0 in | Wt 171.4 lb

## 2021-09-12 DIAGNOSIS — Z9189 Other specified personal risk factors, not elsewhere classified: Secondary | ICD-10-CM

## 2021-09-12 DIAGNOSIS — G4700 Insomnia, unspecified: Secondary | ICD-10-CM | POA: Diagnosis not present

## 2021-09-12 DIAGNOSIS — G2581 Restless legs syndrome: Secondary | ICD-10-CM

## 2021-09-12 NOTE — Patient Instructions (Addendum)
Recommend checking sleep study to rule in/out sleep apnea. If negative we can discuss other treatment options for insomnia and restless leg at follow-up   Recommendations: Work on establish a consistent bedtime and routine (would recommend bedtime 9:30-10:30pm- waketime 5:30-6:30am) You can try taking 3-5mg  melatonin 30-60 mins before sleep Ok to take nap 30-60 mins once during the day Get some light physical exercise daily  Avoid blue lights/electronics an hour prior to bedtime Try sound machine, meditation app, bedtime stories or fan  You can also try massaging your feet and legs to help with neuropathy at bedtime   Orders: In lab sleep study re: at risk for sleep apnea/restless leg syndrome   Follow-up: Call once you have had sleep study to scheduled visit to review results/ plan    Insomnia Insomnia is a sleep disorder that makes it difficult to fall asleep or stay asleep. Insomnia can cause fatigue, low energy, difficulty concentrating, mood swings, and poor performance at work or school. There are three different ways to classify insomnia: Difficulty falling asleep. Difficulty staying asleep. Waking up too early in the morning. Any type of insomnia can be long-term (chronic) or short-term (acute). Both are common. Short-term insomnia usually lasts for three months or less. Chronic insomnia occurs at least three times a week for longer than three months. What are the causes? Insomnia may be caused by another condition, situation, or substance, such as: Anxiety. Certain medicines. Gastroesophageal reflux disease (GERD) or other gastrointestinal conditions. Asthma or other breathing conditions. Restless legs syndrome, sleep apnea, or other sleep disorders. Chronic pain. Menopause. Stroke. Abuse of alcohol, tobacco, or illegal drugs. Mental health conditions, such as depression. Caffeine. Neurological disorders, such as Alzheimer's disease. An overactive thyroid  (hyperthyroidism). Sometimes, the cause of insomnia may not be known. What increases the risk? Risk factors for insomnia include: Gender. Women are affected more often than men. Age. Insomnia is more common as you get older. Stress. Lack of exercise. Irregular work schedule or working night shifts. Traveling between different time zones. Certain medical and mental health conditions. What are the signs or symptoms? If you have insomnia, the main symptom is having trouble falling asleep or having trouble staying asleep. This may lead to other symptoms, such as: Feeling fatigued or having low energy. Feeling nervous about going to sleep. Not feeling rested in the morning. Having trouble concentrating. Feeling irritable, anxious, or depressed. How is this diagnosed? This condition may be diagnosed based on: Your symptoms and medical history. Your health care provider may ask about: Your sleep habits. Any medical conditions you have. Your mental health. A physical exam. How is this treated? Treatment for insomnia depends on the cause. Treatment may focus on treating an underlying condition that is causing insomnia. Treatment may also include: Medicines to help you sleep. Counseling or therapy. Lifestyle adjustments to help you sleep better. Follow these instructions at home: Eating and drinking  Limit or avoid alcohol, caffeinated beverages, and cigarettes, especially close to bedtime. These can disrupt your sleep. Do not eat a large meal or eat spicy foods right before bedtime. This can lead to digestive discomfort that can make it hard for you to sleep. Sleep habits  Keep a sleep diary to help you and your health care provider figure out what could be causing your insomnia. Write down: When you sleep. When you wake up during the night. How well you sleep. How rested you feel the next day. Any side effects of medicines you are taking. What you  eat and drink. Make your bedroom  a dark, comfortable place where it is easy to fall asleep. Put up shades or blackout curtains to block light from outside. Use a white noise machine to block noise. Keep the temperature cool. Limit screen use before bedtime. This includes: Watching TV. Using your smartphone, tablet, or computer. Stick to a routine that includes going to bed and waking up at the same times every day and night. This can help you fall asleep faster. Consider making a quiet activity, such as reading, part of your nighttime routine. Try to avoid taking naps during the day so that you sleep better at night. Get out of bed if you are still awake after 15 minutes of trying to sleep. Keep the lights down, but try reading or doing a quiet activity. When you feel sleepy, go back to bed. General instructions Take over-the-counter and prescription medicines only as told by your health care provider. Exercise regularly, as told by your health care provider. Avoid exercise starting several hours before bedtime. Use relaxation techniques to manage stress. Ask your health care provider to suggest some techniques that may work well for you. These may include: Breathing exercises. Routines to release muscle tension. Visualizing peaceful scenes. Make sure that you drive carefully. Avoid driving if you feel very sleepy. Keep all follow-up visits as told by your health care provider. This is important. Contact a health care provider if: You are tired throughout the day. You have trouble in your daily routine due to sleepiness. You continue to have sleep problems, or your sleep problems get worse. Get help right away if: You have serious thoughts about hurting yourself or someone else. If you ever feel like you may hurt yourself or others, or have thoughts about taking your own life, get help right away. You can go to your nearest emergency department or call: Your local emergency services (911 in the [place] A suicide crisis  helpline, such as the [facility] at 256-123-4766 or 988 in the [place]. This is open 24 hours a day. Summary Insomnia is a sleep disorder that makes it difficult to fall asleep or stay asleep. Insomnia can be long-term (chronic) or short-term (acute). Treatment for insomnia depends on the cause. Treatment may focus on treating an underlying condition that is causing insomnia. Keep a sleep diary to help you and your health care provider figure out what could be causing your insomnia. This information is not intended to replace advice given to you by your health care provider. Make sure you discuss any questions you have with your health care provider. Document Revised: 03/28/2021 Document Reviewed: 07/13/2020 Elsevier Patient Education  2022 [employer].
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
## 2021-09-13 ENCOUNTER — Ambulatory Visit: Payer: Medicare Other | Admitting: Physical Therapy

## 2021-09-18 ENCOUNTER — Encounter: Payer: Self-pay | Admitting: Oncology

## 2021-09-19 ENCOUNTER — Ambulatory Visit: Payer: Medicare Other | Attending: Neurology | Admitting: Physical Therapy

## 2021-09-19 ENCOUNTER — Other Ambulatory Visit: Payer: Self-pay | Admitting: Family Medicine

## 2021-09-19 ENCOUNTER — Telehealth: Payer: Self-pay | Admitting: Oncology

## 2021-09-19 DIAGNOSIS — Z9189 Other specified personal risk factors, not elsewhere classified: Secondary | ICD-10-CM | POA: Insufficient documentation

## 2021-09-19 DIAGNOSIS — G8929 Other chronic pain: Secondary | ICD-10-CM | POA: Diagnosis present

## 2021-09-19 DIAGNOSIS — M792 Neuralgia and neuritis, unspecified: Secondary | ICD-10-CM | POA: Diagnosis present

## 2021-09-19 DIAGNOSIS — M5440 Lumbago with sciatica, unspecified side: Secondary | ICD-10-CM | POA: Insufficient documentation

## 2021-09-19 DIAGNOSIS — M7918 Myalgia, other site: Secondary | ICD-10-CM

## 2021-09-19 DIAGNOSIS — M6281 Muscle weakness (generalized): Secondary | ICD-10-CM | POA: Diagnosis present

## 2021-09-19 DIAGNOSIS — R2681 Unsteadiness on feet: Secondary | ICD-10-CM | POA: Insufficient documentation

## 2021-09-19 DIAGNOSIS — M5442 Lumbago with sciatica, left side: Secondary | ICD-10-CM

## 2021-09-19 NOTE — Assessment & Plan Note (Signed)
-   Patient has trouble initiating sleep. Hx PTSD and RLS. Advised she try taking 3-5mg  melatonin 30-60 mins before sleep and establishing consistent bedtime routine. We review sleep hygiene recommendations at length and gave her patient educational material to review.

## 2021-09-19 NOTE — Assessment & Plan Note (Addendum)
-   Patient has symptoms of snoring, restless sleep and daytime sleepiness. At risk for sleep apnea d.t obesity. Recommend checking sleep study to rule in/out sleep apnea. She needs in lab study d/t restless leg symptoms.We reviewed risk of untreated sleep apnea including cardiac arrhthymias, stroke, pulm HTN and DM. If sleep study is negative for OSA we can discuss other treatment options for insomnia and restless leg at follow-up. Advised against driving if experiencing excessive daytime sleepiness. FU after sleep study to review results.

## 2021-09-19 NOTE — Therapy (Signed)
[facility] [address] [place], [address] Phone: [phone]   Fax:  [phone]  Physical Therapy Treatment  Patient Details  Name: Monique [name] MRN: [id number] Date of Birth: 26-May-1971 Referring Provider (PT): Gurney Maxin, MD   Encounter Date: 09/19/2021   PT End of Session - 09/19/21 1507     Visit Number 4    Number of Visits 16    Date for PT Re-Evaluation 10/10/21    PT Start Time 1504    PT Stop Time 1545    PT Time Calculation (min) 41 min    Equipment Utilized During Treatment Gait belt    Activity Tolerance Patient tolerated treatment well    Behavior During Therapy WFL for tasks assessed/performed             Past Medical History:  Diagnosis Date   Anxiety    Bursitis    leg pain   Carpal tunnel syndrome    Cervical dysplasia    hx LEEP over 18 years ago.    Chronic lymphocytic leukemia ([place]) 2018   Dr Grayland Ormond.  (in lymph nodes)   COPD (chronic obstructive pulmonary disease) ([place])    COVID-19 2021   Depression    Eating disorder    GERD (gastroesophageal reflux disease)    History of self-harm    Insomnia    Obsession    Tobacco abuse    Vitamin B12 deficiency (non anemic)     Past Surgical History:  Procedure Laterality Date   BREAST BIOPSY Right 01/05/2018   US guided biopsy of 2 areas and 1 lymph node, MIXED INFLAMMATION AND GIANT CELL REACTION   CERVICAL BIOPSY  W/ LOOP ELECTRODE EXCISION     COLONOSCOPY WITH PROPOFOL N/A 04/21/2019   Procedure: COLONOSCOPY WITH PROPOFOL;  Surgeon: Virgel Manifold, MD;  Location: [facility] ENDOSCOPY;  Service: Gastroenterology;  Laterality: N/A;   OTHER SURGICAL HISTORY     scar tissue removed from vocal cords   TUBAL LIGATION     VAGINAL HYSTERECTOMY N/A 01/22/2021   Procedure: HYSTERECTOMY VAGINAL; BILATERAL SALPINGECTOMY;  Surgeon: Rubie Maid, MD;  Location: [facility] ORS;  Service: Gynecology;  Laterality: N/A;   vocal  cord surgery  2005    There were no vitals filed for this visit.   Subjective Assessment - 09/19/21 1505     Subjective Pt reports that she is unable to use her elliptical for > 2 min because of the pain her feet. She had a close call where she was rounding a corner and then almost fell.    Pertinent History TINGLING/ NUMBNESS  - Ongoing.  - Patient with numbness and tingling in hands and feet. Having some imbalance. Also with chronic back pain, receiving injections with pain clinic.  - EMG uppers showed moderate right and mild left carpal tunnel syndrome.  - Reviewed EMG lowers showing neuropathy.  - Imbalance likely due to sensory ataxia.  - Start wearing carpal tunnel braces nightly.  - Call if you want referral for carpal tunnel injections; patient declines today.   - Continue to follow with pain management.  - Start doing balance exercises for 5 minutes a day.  - Referral to PT for gait and balance and for back pain.    Limitations Walking;Sitting    How long can you sit comfortably? Needs to extend knees otherwise has increased pain.    How long can you stand comfortably? Not for a long period of time.  Feels burning sensation and tingling sensation through her legs. Feels cramping too.    How long can you walk comfortably? For short distances. She does have a cane or something to hold onto like cart, etc.    Diagnostic tests 06/02/20  EXAM: MRI LUMBAR SPINE WITHOUT CONTRAST   IMPRESSION:   1. 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 Also there is mild bilateral neural foraminal narrowing and mild central canal stenosis.   2. Stable retroperitoneal lymphadenopathy.   3. Diffuse marrow changes most consistent with marrow reconversion, stable from prior exam.    Patient Stated Goals Little less pain in her low back.    Currently in Pain? Yes     Pain Score 2     Pain Location Leg    Pain Orientation Right    Pain Descriptors / Indicators Aching;Burning    Pain Type Chronic pain    Pain Onset More than a month ago    Multiple Pain Sites Yes    Pain Score 3    Pain Location Knee    Pain Orientation Left    Pain Descriptors / Indicators Aching    Pain Type Chronic pain             THEREX:   TM 4 min  -Terminated due to neuropathic pain on bilateral feet  Recumbent Bicycle 2 min  -Terminated due to neuropathic pain on bilateral fee   Standing Hip Abduction with BUE support 2 x 10   Standing Heel Raises  with 1 UE support 3 x 10  Standing Calf Stretch 3 x 30 sec   Mini-Squats with BUE 1 x 10  -Anterior pelvic tilt and increased back pain   Wall Squats 3 x 10  -min VC to maintain contact of low back against wall                    PT Education - 09/19/21 1517     Education Details form and technique for appropriate exercise    Person(s) Educated Patient    Methods Explanation;Demonstration;Verbal cues;Handout    Comprehension Verbalized understanding;Returned demonstration;Verbal cues required              PT Short Term Goals - 09/19/21 1510       PT SHORT TERM GOAL #1   Title Patient will demonstrate understanding of home exercise plan.    Baseline 11/30: NT    Time 2    Period Weeks    Status On-going    Target Date 08/29/21               PT Long Term Goals - 09/19/21 1512       PT LONG TERM GOAL #1   Title Patient will have improved function and activity level as evidenced by an increase in his FOTO score to predicted score of 55.    Baseline 08/15/21: 52/55    Time 8    Period Weeks    Status New      PT LONG TERM GOAL #2   Title Patient will improve her hip strength to 5/5 in order to improve low back pain and improve steadiness with gait.    Baseline 08/15/21: Hip Abduction R/L 4/4, Hip Adduction R/L 4/4 , Hip Ext R/L 4/4    Time 8    Period Weeks     Status  On-going    Target Date 10/10/21      PT LONG TERM GOAL #3   Title Patient will score >=22 on DGI to demonstrate that she has balance that is within norms for her age.    Baseline 11/30: NT 12/5: 21/24    Time 8    Period Weeks    Status On-going    Target Date 10/10/21                   Plan - 09/19/21 1507     Clinical Impression Statement Pt presents for f/u for imbalance and LE pain an weakness in the setting of lumbar stenosis and peripheral neuropathy. She continues to be limited in performing aerobic exercise due to increased peripheral neuropathy even in non- weight bearing positions. She was able to tolerate all strengthening exercises even with progression to single leg stance hip strengthening activities.   She will continue to benefit from skilled PT to improve her balance and hip strength and flexibility and to decrease her low back pain, so that she can manage her pain to return ADL.    Personal Factors and Comorbidities Comorbidity 3+    Comorbidities CLL, PTSD, Neuropathy    Examination-Activity Limitations Stand;Stairs;Locomotion Level;Carry    Examination-Participation Restrictions Community Activity;Shop;Cleaning    Stability/Clinical Decision Making Stable/Uncomplicated    Clinical Decision Making Low    Rehab Potential Fair    PT Frequency 2x / week    PT Duration 8 weeks    PT Treatment/Interventions ADLs/Self Care Home Management;Aquatic Therapy;Electrical Stimulation;Cryotherapy;Moist Heat;Balance training;Neuromuscular re-education;Patient/family education;Dry needling;Passive range of motion;Stair training;Gait training;DME Instruction;Manual techniques;Joint Manipulations;Spinal Manipulations;Vestibular;Therapeutic exercise;Therapeutic activities    PT Home Exercise Plan [place] and Agree with Plan of Care Patient            HEP includes the following:  Access Code: [place]: [email or web address] Date:  09/19/2021 Prepared by: Bradly Chris  Exercises Standing Gastroc Stretch on Step - 1 x daily - 7 x weekly - 1 sets - 3 reps - 30 hold Prone Quadriceps Stretch with Strap - 1 x daily - 7 x weekly - 1 sets - 3 reps - 30 hold Seated Hip Adduction Isometrics with Ball - 1 x daily - 3 x weekly - 3 sets - 10 reps - 5 hold Wall Squat - 1 x daily - 3 x weekly - 3 sets - 10 reps Standing Hip Abduction with Anterior Support - 1 x daily - 3 x weekly - 3 sets - 10 reps Standing Heel Raise with Support - 1 x daily - 3 x weekly - 3 sets - 10 reps   Patient will benefit from skilled therapeutic intervention in order to improve the following deficits and impairments:  Abnormal gait, Pain, Impaired sensation, Impaired flexibility, Increased muscle spasms, Decreased strength, Decreased mobility, Decreased balance  Visit Diagnosis: Chronic bilateral low back pain with sciatica, sciatica laterality unspecified  Muscle weakness (generalized)  Unsteadiness on feet     Problem List Patient Active Problem List   Diagnosis Date Noted   Lumbar foraminal stenosis (L4-5) (Bilateral) (R>L) 06/05/2021   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 06/05/2021   Lumbosacral radiculopathy/radiculitis at L5 (Bilateral) 06/05/2021   PAD (peripheral artery disease) ([place]) 02/14/2021   CIN II (cervical intraepithelial neoplasia II) 02/01/2021   S/P vaginal hysterectomy 01/22/2021   Acute right hip pain 06/05/2020   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/09/2020   Bilateral leg pain 11/03/2019   Iron deficiency  anemia 10/15/2019   Bilateral arm pain 08/05/2019   Bilateral hand numbness 08/05/2019   Weakness of both hands 08/05/2019   Chronic musculoskeletal pain 07/19/2019   Chronic neuropathic pain 07/19/2019   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   Special screening for malignant neoplasms, colon    Polyp of sigmoid colon    Hemorrhoids    Trigeminal neuralgia 10/05/2018   Lumbar  radiculitis (Right) 09/24/2018   Hypocalcemia 06/03/2018   Vitamin D insufficiency 06/03/2018   Abnormal MRI, lumbar spine (06/02/2020) 06/03/2018   Chronic hip pain (Right) 06/03/2018   Spondylosis without myelopathy or radiculopathy, lumbar region 06/03/2018   DDD (degenerative disc disease), lumbar 06/02/2018   Lumbar facet hypertrophy 06/02/2018   Lumbar facet arthropathy 06/02/2018   Lumbar facet syndrome (Bilateral) (R>L) 06/02/2018   Marijuana use 05/19/2018   Chronic lower extremity pain (1ry area of Pain) (Bilateral) (R>L) 05/13/2018   Chronic low back pain (2ry area of Pain) (Bilateral) (R>L) w/ sciatica (Bilateral) 05/13/2018   Chronic pain syndrome 05/13/2018   Disorder of skeletal system 05/13/2018   Pharmacologic therapy 05/13/2018   Problems influencing health status 05/13/2018   Mastalgia 05/05/2018   Breast mass, right 01/07/2018   Face pain 12/11/2017   Tingling 12/11/2017   Thoracic aortic atherosclerosis ([place]) 08/20/2017   Emphysema of lung ([place]) 08/20/2017   Adductor tendinitis 04/23/2017   Trochanteric bursitis of right hip 04/23/2017   GERD without esophagitis 12/11/2016   CLL (chronic lymphocytic leukemia) ([place]) 11/24/2016   Pap smear abnormality of cervix/human papillomavirus (HPV) positive 09/12/2016   Stress incontinence 09/04/2016   B12 deficiency 07/10/2015   Insomnia, persistent 07/10/2015   Anorexia nervosa, restricting type 07/10/2015   Anxiety, generalized 07/10/2015   H/O suicide attempt 07/10/2015   Lymphocytosis 07/10/2015   Obsessive-compulsive disorder 07/10/2015   Tobacco use 07/10/2015   History of cervical dysplasia 07/18/2014   Bradly Chris PT, DPT  09/19/2021, 4:01 PM  [facility] [address] [place], [address] Phone: [phone]   Fax:  [phone]  Name: Rosamary Boudreau MRN: [id number] Date of Birth: May 22, 1971

## 2021-09-19 NOTE — Telephone Encounter (Signed)
Pt called in stating that she has lab appt on 1-26. But Md appt. She wants to set up an appt. Call back at [phone]

## 2021-09-24 ENCOUNTER — Encounter: Payer: Medicaid Other | Admitting: Physical Therapy

## 2021-09-25 ENCOUNTER — Ambulatory Visit: Payer: Medicare Other | Admitting: Physical Therapy

## 2021-09-25 ENCOUNTER — Other Ambulatory Visit: Payer: Self-pay

## 2021-09-25 DIAGNOSIS — M6281 Muscle weakness (generalized): Secondary | ICD-10-CM

## 2021-09-25 DIAGNOSIS — M5440 Lumbago with sciatica, unspecified side: Secondary | ICD-10-CM | POA: Diagnosis not present

## 2021-09-25 DIAGNOSIS — R2681 Unsteadiness on feet: Secondary | ICD-10-CM

## 2021-09-25 DIAGNOSIS — G8929 Other chronic pain: Secondary | ICD-10-CM

## 2021-09-25 MED ORDER — ARIPIPRAZOLE 2 MG PO TABS: 2.0000 mg | ORAL_TABLET | Freq: Every day | ORAL | 0 refills | Status: DC

## 2021-09-25 NOTE — Telephone Encounter (Signed)
Has an appt 2/3

## 2021-09-25 NOTE — Therapy (Signed)
[facility] [address] [place], [address] Phone: [phone]   Fax:  [phone]  Physical Therapy Treatment  Patient Details  Name: Monique [name] MRN: [id number] Date of Birth: 1971-07-24 Referring Provider (PT): Gurney Maxin, MD   Encounter Date: 09/25/2021   PT End of Session - 09/25/21 1430     Visit Number 5    Number of Visits 16    Date for PT Re-Evaluation 10/10/21    PT Start Time 1700    PT Stop Time 1100    PT Time Calculation (min) 45 min    Equipment Utilized During Treatment Gait belt    Activity Tolerance Patient tolerated treatment well    Behavior During Therapy WFL for tasks assessed/performed             Past Medical History:  Diagnosis Date   Anxiety    Bursitis    leg pain   Carpal tunnel syndrome    Cervical dysplasia    hx LEEP over 18 years ago.    Chronic lymphocytic leukemia ([place]) 2018   Dr Grayland Ormond.  (in lymph nodes)   COPD (chronic obstructive pulmonary disease) ([place])    COVID-19 2021   Depression    Eating disorder    GERD (gastroesophageal reflux disease)    History of self-harm    Insomnia    Obsession    Tobacco abuse    Vitamin B12 deficiency (non anemic)     Past Surgical History:  Procedure Laterality Date   BREAST BIOPSY Right 01/05/2018   US guided biopsy of 2 areas and 1 lymph node, MIXED INFLAMMATION AND GIANT CELL REACTION   CERVICAL BIOPSY  W/ LOOP ELECTRODE EXCISION     COLONOSCOPY WITH PROPOFOL N/A 04/21/2019   Procedure: COLONOSCOPY WITH PROPOFOL;  Surgeon: Virgel Manifold, MD;  Location: [facility] ENDOSCOPY;  Service: Gastroenterology;  Laterality: N/A;   OTHER SURGICAL HISTORY     scar tissue removed from vocal cords   TUBAL LIGATION     VAGINAL HYSTERECTOMY N/A 01/22/2021   Procedure: HYSTERECTOMY VAGINAL; BILATERAL SALPINGECTOMY;  Surgeon: Rubie Maid, MD;  Location: [facility] ORS;  Service: Gynecology;  Laterality: N/A;   vocal  cord surgery  2005    There were no vitals filed for this visit.   Subjective Assessment - 09/25/21 1019     Subjective Pt reports that she felt a significant amount of pain after last session, and that she was able to do all exercises without an issue. She is inquiring about a gym membership to [employer] to continue completing exercises.    Pertinent History TINGLING/ NUMBNESS  - Ongoing.  - Patient with numbness and tingling in hands and feet. Having some imbalance. Also with chronic back pain, receiving injections with pain clinic.  - EMG uppers showed moderate right and mild left carpal tunnel syndrome.  - Reviewed EMG lowers showing neuropathy.  - Imbalance likely due to sensory ataxia.  - Start wearing carpal tunnel braces nightly.  - Call if you want referral for carpal tunnel injections; patient declines today.   - Continue to follow with pain management.  - Start doing balance exercises for 5 minutes a day.  - Referral to PT for gait and balance and for back pain.    Limitations Walking;Sitting    How long can you sit comfortably? Needs to extend knees otherwise has increased pain.    How long can you stand comfortably? Not for a long  period of time. Feels burning sensation and tingling sensation through her legs. Feels cramping too.    How long can you walk comfortably? For short distances. She does have a cane or something to hold onto like cart, etc.    Diagnostic tests 06/02/20  EXAM: MRI LUMBAR SPINE WITHOUT CONTRAST   IMPRESSION:   1. 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 Also there is mild bilateral neural foraminal narrowing and mild central canal stenosis.   2. Stable retroperitoneal lymphadenopathy.   3. Diffuse marrow changes most consistent with marrow reconversion, stable from prior exam.    Patient Stated Goals Little less pain  in her low back.    Currently in Pain? Yes    Pain Score 8     Pain Location Leg    Pain Orientation Right    Pain Descriptors / Indicators Aching;Burning    Pain Type Chronic pain    Pain Onset More than a month ago               THEREX:  Quadruped Hip Ext 2 x 10  Quadruped Hip Abduction 1 x 10  -Pt feels uncomfortable in side of hip   Sidelying Hip Abduction 3 x 10  -min VC to maintain straight   Sidelying Hip Adduction 3 x 10   Doorwary Rhomboid stretch 3 x 30 sec  -Pt reports increased pain in rhomboids  -Stretch relieves pain between patient's shoulder blades.             PT Education - 09/25/21 1429     Education Details form and technique for appropriate exercise    Person(s) Educated Patient    Methods Explanation;Demonstration;Verbal cues;Handout;Tactile cues    Comprehension Verbalized understanding;Returned demonstration;Verbal cues required;Tactile cues required              PT Short Term Goals - 09/25/21 1021       PT SHORT TERM GOAL #1   Title Patient will demonstrate understanding of home exercise plan.    Baseline 11/30: NT    Time 2    Period Weeks    Status On-going    Target Date 08/29/21               PT Long Term Goals - 09/25/21 1020       PT LONG TERM GOAL #1   Title Patient will have improved function and activity level as evidenced by an increase in his FOTO score to predicted score of 55.    Baseline 08/15/21: 52/55    Time 8    Period Weeks    Status New      PT LONG TERM GOAL #2   Title Patient will improve her hip strength to 5/5 in order to improve low back pain and improve steadiness with gait.    Baseline 08/15/21: Hip Abduction R/L 4/4, Hip Adduction R/L 4/4 , Hip Ext R/L 4/4    Time 8    Period Weeks    Status On-going    Target Date 10/10/21      PT LONG TERM GOAL #3   Title Patient will score >=22 on DGI to demonstrate that she has balance that is within norms for her age.    Baseline  11/30: NT 12/5: 21/24    Time 8    Period Weeks    Status On-going  Target Date 10/10/21                  Plan - 09/25/21 1430     Clinical Impression Statement Pt presents for f/u for imbalance and LE pain and weakness in the setting of lumbar stenosis and peripheral neuropathy. She continues to experience random flares of peripheral neuropathy that have no particular pattern. PT modified exercises with conversion of weight bearing exercises to non-weight bearing exercises to determine whether weight bearing exercise is worsening neuropathy. She was able to tolerate all non-weightbearing exercises without an increase in her pain or symptoms. She will continue to benefit from skilled PT to improve her balance and hip strength and flexibility and to decrease her low back pain, so that she can manage her pain to return ADL.    Personal Factors and Comorbidities Comorbidity 3+    Comorbidities CLL, PTSD, Neuropathy    Examination-Activity Limitations Stand;Stairs;Locomotion Level;Carry    Examination-Participation Restrictions Community Activity;Shop;Cleaning    Stability/Clinical Decision Making Stable/Uncomplicated    Clinical Decision Making Low    Rehab Potential Fair    PT Frequency 2x / week    PT Duration 8 weeks    PT Treatment/Interventions ADLs/Self Care Home Management;Aquatic Therapy;Electrical Stimulation;Cryotherapy;Moist Heat;Balance training;Neuromuscular re-education;Patient/family education;Dry needling;Passive range of motion;Stair training;Gait training;DME Instruction;Manual techniques;Joint Manipulations;Spinal Manipulations;Vestibular;Therapeutic exercise;Therapeutic activities    PT Next Visit Plan Reassess goals. Begin dynamic balance exercises and progress non-weight bearing hip exercises if lessens peripheral neuropathy symptom flare.    PT Home Exercise Plan [place] and Agree with Plan of Care Patient            HEP includes the  following:  Access Code: [place]: [email or web address] Date: 09/25/2021 Prepared by: Bradly Chris  Exercises Standing Gastroc Stretch on Step - 1 x daily - 7 x weekly - 1 sets - 3 reps - 30 hold Prone Quadriceps Stretch with Strap - 1 x daily - 7 x weekly - 1 sets - 3 reps - 30 hold Beginner Front Arm Support - 1 x daily - 3 x weekly - 3 sets - 10 reps Sidelying Hip Abduction - 1 x daily - 3 x weekly - 3 sets - 10 reps Sidelying Hip Adduction - 1 x daily - 3 x weekly - 3 sets - 10 reps Doorway Rhomboid Stretch - 1 x daily - 7 x weekly - 1 sets - 3 reps - 30 hold  Patient will benefit from skilled therapeutic intervention in order to improve the following deficits and impairments:  Abnormal gait, Pain, Impaired sensation, Impaired flexibility, Increased muscle spasms, Decreased strength, Decreased mobility, Decreased balance  Visit Diagnosis: Chronic bilateral low back pain with sciatica, sciatica laterality unspecified  Muscle weakness (generalized)  Unsteadiness on feet  Chronic neuropathic pain     Problem List 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 06/05/2021   Lumbosacral radiculopathy/radiculitis at L5 (Bilateral) 06/05/2021   PAD (peripheral artery disease) ([place]) 02/14/2021   CIN II (cervical intraepithelial neoplasia II) 02/01/2021   S/P vaginal hysterectomy 01/22/2021   Acute right hip pain 06/05/2020   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/09/2020   Bilateral leg pain 11/03/2019   Iron deficiency anemia 10/15/2019   Bilateral arm pain 08/05/2019   Bilateral hand numbness 08/05/2019   Weakness of both hands 08/05/2019   Chronic  musculoskeletal pain 07/19/2019   Chronic neuropathic pain 07/19/2019   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   Special screening for  malignant neoplasms, colon    Polyp of sigmoid colon    Hemorrhoids    Trigeminal neuralgia 10/05/2018   Lumbar radiculitis (Right) 09/24/2018   Hypocalcemia 06/03/2018   Vitamin D insufficiency 06/03/2018   Abnormal MRI, lumbar spine (06/02/2020) 06/03/2018   Chronic hip pain (Right) 06/03/2018   Spondylosis without myelopathy or radiculopathy, lumbar region 06/03/2018   DDD (degenerative disc disease), lumbar 06/02/2018   Lumbar facet hypertrophy 06/02/2018   Lumbar facet arthropathy 06/02/2018   Lumbar facet syndrome (Bilateral) (R>L) 06/02/2018   Marijuana use 05/19/2018   Chronic lower extremity pain (1ry area of Pain) (Bilateral) (R>L) 05/13/2018   Chronic low back pain (2ry area of Pain) (Bilateral) (R>L) w/ sciatica (Bilateral) 05/13/2018   Chronic pain syndrome 05/13/2018   Disorder of skeletal system 05/13/2018   Pharmacologic therapy 05/13/2018   Problems influencing health status 05/13/2018   Mastalgia 05/05/2018   Breast mass, right 01/07/2018   Face pain 12/11/2017   Tingling 12/11/2017   Thoracic aortic atherosclerosis ([place]) 08/20/2017   Emphysema of lung ([place]) 08/20/2017   Adductor tendinitis 04/23/2017   Trochanteric bursitis of right hip 04/23/2017   GERD without esophagitis 12/11/2016   CLL (chronic lymphocytic leukemia) ([place]) 11/24/2016   Pap smear abnormality of cervix/human papillomavirus (HPV) positive 09/12/2016   Stress incontinence 09/04/2016   B12 deficiency 07/10/2015   Insomnia 07/10/2015   Anorexia nervosa, restricting type 07/10/2015   Anxiety, generalized 07/10/2015   H/O suicide attempt 07/10/2015   Lymphocytosis 07/10/2015   Obsessive-compulsive disorder 07/10/2015   Tobacco use 07/10/2015   History of cervical dysplasia 07/18/2014   Bradly Chris PT, DPT  09/25/2021, 2:36 PM  [facility] [address] [place], [address] Phone: [phone]   Fax:   [phone]  Name: Monique [name] MRN: [id number] Date of Birth: 09/17/1970

## 2021-09-28 ENCOUNTER — Other Ambulatory Visit (HOSPITAL_COMMUNITY): Payer: Self-pay

## 2021-10-01 ENCOUNTER — Encounter: Payer: Self-pay | Admitting: Oncology

## 2021-10-01 ENCOUNTER — Ambulatory Visit: Payer: Medicare Other | Admitting: Physical Therapy

## 2021-10-01 ENCOUNTER — Other Ambulatory Visit (HOSPITAL_COMMUNITY): Payer: Self-pay

## 2021-10-03 ENCOUNTER — Other Ambulatory Visit: Payer: Self-pay | Admitting: *Deleted

## 2021-10-03 DIAGNOSIS — D509 Iron deficiency anemia, unspecified: Secondary | ICD-10-CM

## 2021-10-05 ENCOUNTER — Ambulatory Visit: Payer: Medicare Other | Admitting: Physical Therapy

## 2021-10-05 ENCOUNTER — Encounter: Payer: Self-pay | Admitting: Oncology

## 2021-10-09 ENCOUNTER — Ambulatory Visit: Payer: Medicare Other | Admitting: Physical Therapy

## 2021-10-11 ENCOUNTER — Inpatient Hospital Stay: Payer: Medicare Other | Attending: Oncology

## 2021-10-11 ENCOUNTER — Inpatient Hospital Stay: Payer: Medicare Other | Admitting: Pharmacist

## 2021-10-11 ENCOUNTER — Other Ambulatory Visit: Payer: Self-pay

## 2021-10-11 VITALS — BP 112/80 | HR 78 | Temp 98.3°F | Resp 16 | Wt 170.1 lb

## 2021-10-11 DIAGNOSIS — C9110 Chronic lymphocytic leukemia of B-cell type not having achieved remission: Secondary | ICD-10-CM | POA: Insufficient documentation

## 2021-10-11 DIAGNOSIS — D509 Iron deficiency anemia, unspecified: Secondary | ICD-10-CM

## 2021-10-11 LAB — COMPREHENSIVE METABOLIC PANEL
ALT: 12 U/L (ref 0–44)
AST: 18 U/L (ref 15–41)
Albumin: 4 g/dL (ref 3.5–5.0)
Alkaline Phosphatase: 57 U/L (ref 38–126)
Anion gap: 10 (ref 5–15)
BUN: 29 mg/dL — ABNORMAL HIGH (ref 6–20)
CO2: 24 mmol/L (ref 22–32)
Calcium: 8.9 mg/dL (ref 8.9–10.3)
Chloride: 106 mmol/L (ref 98–111)
Creatinine, Ser: 0.81 mg/dL (ref 0.44–1.00)
GFR, Estimated: >60 mL/min (ref >60)
Glucose, Bld: 81 mg/dL (ref 70–99)
Potassium: 4 mmol/L (ref 3.5–5.1)
Sodium: 140 mmol/L (ref 135–145)
Total Bilirubin: 0.4 mg/dL (ref 0.3–1.2)
Total Protein: 6.6 g/dL (ref 6.5–8.1)

## 2021-10-11 LAB — CBC WITH DIFFERENTIAL/PLATELET
Abs Immature Granulocytes: 0.03 10*3/uL (ref 0.00–0.07)
Basophils Absolute: 0.1 10*3/uL (ref 0.0–0.1)
Basophils Relative: 0 %
Eosinophils Absolute: 0.1 10*3/uL (ref 0.0–0.5)
Eosinophils Relative: 1 %
HCT: 46 % (ref 36.0–46.0)
Hemoglobin: 15.6 g/dL — ABNORMAL HIGH (ref 12.0–15.0)
Immature Granulocytes: 0 %
Lymphocytes Relative: 76 %
Lymphs Abs: 9.5 10*3/uL — ABNORMAL HIGH (ref 0.7–4.0)
MCH: 32.6 pg (ref 26.0–34.0)
MCHC: 33.9 g/dL (ref 30.0–36.0)
MCV: 96 fL (ref 80.0–100.0)
Monocytes Absolute: 0.5 10*3/uL (ref 0.1–1.0)
Monocytes Relative: 4 %
Neutro Abs: 2.5 10*3/uL (ref 1.7–7.7)
Neutrophils Relative %: 19 %
Platelets: 154 10*3/uL (ref 150–400)
RBC: 4.79 MIL/uL (ref 3.87–5.11)
RDW: 13.1 % (ref 11.5–15.5)
WBC: 12.7 10*3/uL — ABNORMAL HIGH (ref 4.0–10.5)
nRBC: 0.2 % (ref 0.0–0.2)

## 2021-10-11 LAB — IRON AND TIBC
Iron: 71 ug/dL (ref 28–170)
Saturation Ratios: 24 % (ref 10.4–31.8)
TIBC: 302 ug/dL (ref 250–450)
UIBC: 231 ug/dL

## 2021-10-11 LAB — FERRITIN: Ferritin: 33 ng/mL (ref 11–307)

## 2021-10-11 NOTE — Progress Notes (Signed)
[facility]  [phone] [phone]  Patient Care Team: [name], MD as PCP - General (Family Medicine) Lloyd Huger, MD as Consulting Physician (Oncology) Lane Hacker, [facility] as Pharmacist (Pharmacist) Alyce Pagan as [place] Management (Licensed Clinical Social Worker)   Name of the patient: Monique [name]  [id number]  12/03/1970   Date of visit: 10/11/21  HPI: Patient is a 51 y.o. female with CLL. Currently treated with ibrutinib. She has been on treatment since 07/2018.  Reason for Consult: Oral chemotherapy follow-up for ibrutinib therapy.   PAST MEDICAL HISTORY: Past Medical History:  Diagnosis Date   Anxiety    Bursitis    leg pain   Carpal tunnel syndrome    Cervical dysplasia    hx LEEP over 18 years ago.    Chronic lymphocytic leukemia ([place]) 2018   Dr Grayland Ormond.  (in lymph nodes)   COPD (chronic obstructive pulmonary disease) ([place])    COVID-19 2021   Depression    Eating disorder    GERD (gastroesophageal reflux disease)    History of self-harm    Insomnia    Obsession    Tobacco abuse    Vitamin B12 deficiency (non anemic)     HEMATOLOGY/ONCOLOGY HISTORY:  Oncology History Overview Note  Patient with recent CLL diagnosis by flow cytometry.  Previously not being treated in observation.   S/p 4 cycles of weekly Rituxan completing on 02/20/2017 scans from January 2018 revealed minimal progression.   Developed right breast mass in March 2019.  Had diagnostic mammogram performed on 12/26/2017 showed suspicious right breast mass.  Biopsy revealed granulomatous mastitis.  She was treated with doxycycline 100 mg twice daily for 7 days.   Developed worsening adenopathy.  CT chest/abdomen/pelvis and soft tissue neck from May 2019 revealed progressive bulky adenopathy in the chest, abdomen and pelvis compatible with worsening disease.   [name] recommended  beginning treatment with Rituxan and Treanda.  Received first cycle last week.  S/p 6 cycles of Rituxan and Treanda.  Tolerating well.  Last dose given 03/25/18.   CLL (chronic lymphocytic leukemia) ([place])  11/24/2016 Initial Diagnosis   CLL (chronic lymphocytic leukemia) ([place])   01/21/2018 - 03/26/2018 Chemotherapy   The patient had palonosetron (ALOXI) injection 0.25 mg, 0.25 mg, Intravenous,  Once, 4 of 5 cycles Administration: 0.25 mg (01/28/2018), 0.25 mg (02/25/2018), 0.25 mg (03/25/2018) riTUXimab (RITUXAN) 700 mg in sodium chloride 0.9 % 250 mL (2.1875 mg/mL) infusion, 375 mg/m2 = 700 mg, Intravenous,  Once, 4 of 5 cycles Administration: 700 mg (01/28/2018) bendamustine (BENDEKA) 150 mg in sodium chloride 0.9 % 50 mL (2.6786 mg/mL) chemo infusion, 90 mg/m2 = 150 mg, Intravenous,  Once, 4 of 5 cycles Administration: 150 mg (01/28/2018), 150 mg (01/29/2018), 150 mg (02/25/2018), 150 mg (02/26/2018), 150 mg (03/25/2018), 150 mg (03/26/2018)   for chemotherapy treatment.       ALLERGIES:  has No Known Allergies.  MEDICATIONS:  Current Outpatient Medications  Medication Sig Dispense Refill   ARIPiprazole (ABILIFY) 2 MG tablet Take 1 tablet (2 mg total) by mouth at bedtime. 30 tablet 0   Ascorbic Acid (VITAMIN C) 1000 MG tablet Take 1,000 mg by mouth daily as needed (immune health).     atorvastatin (LIPITOR) 10 MG tablet Take 1 tablet (10 mg total) by mouth in the morning. 90 tablet 3   benzonatate (TESSALON PERLES) 100 MG capsule Take 2 capsules (200 mg total) by mouth 2 (two)  times daily as needed for cough. 20 capsule 0   Crisaborole (EUCRISA) 2 % OINT Apply 1 application topically as directed. Qd to bid aa rash on hands and feet until clear, then prn flares 60 g 11   estradiol (CLIMARA) 0.075 mg/24hr patch Place 1 patch (0.075 mg total) onto the skin once a week. 4 patch 12   fluticasone-salmeterol (ADVAIR DISKUS) 250-50 MCG/ACT AEPB Inhale 1 puff into the lungs in the morning and at bedtime.  60 each 5   ibrutinib (IMBRUVICA) 420 MG TABS Take 1 tablet (420 mg) by mouth daily. 28 tablet 5   loratadine (CLARITIN) 10 MG tablet Take 1 tablet (10 mg total) by mouth every morning. 90 tablet 3   Magnesium 500 MG CAPS Take 500 mg by mouth in the morning.     meloxicam (MOBIC) 15 MG tablet Take 1 tablet (15 mg total) by mouth daily. 90 tablet 1   omeprazole (PRILOSEC) 20 MG capsule TAKE ONE CAPSULE BY MOUTH EVERY MORNING 90 capsule 1   Oxymetazoline HCl (RHOFADE) 1 % CREA Qam to face 30 g 11   pregabalin (LYRICA) 100 MG capsule Take 100 mg by mouth 3 (three) times daily.     Ruxolitinib Phosphate (OPZELURA) 1.5 % CREA Apply 1 application topically as directed. Qd to bid aa rash on hands and feet until clear, then prn flares 60 g 11   tiZANidine (ZANAFLEX) 2 MG tablet TAKE ONE TABLET BY MOUTH THREE TIMES DAILY 90 tablet 0   vitamin B-12 (CYANOCOBALAMIN) 500 MCG tablet Take 250 mcg by mouth every morning.     Calcium Carbonate-Vit D-Min (GNP CALCIUM 1200) 1200-1000 MG-UNIT CHEW Chew 1,200 mg by mouth daily with breakfast. Take in combination with vitamin D and magnesium. (Patient taking differently: Chew 2 tablets by mouth in the morning, at noon, and at bedtime. Take in combination with vitamin D and magnesium.) 90 tablet 3   DULoxetine (CYMBALTA) 60 MG capsule Take 1 capsule (60 mg total) by mouth daily. (Patient not taking: Reported on 10/11/2021) 90 capsule 0   sertraline (ZOLOFT) 100 MG tablet TAKE 1 AND 1/2 TABLETS BY MOUTH EVERY MORNING 135 tablet 0   No current facility-administered medications for this visit.    VITAL SIGNS: BP 112/80    Pulse 78    Temp 98.3 F (36.8 C)    Resp 16    Wt 77.2 kg (170 lb 1.6 oz)    LMP  (LMP Unknown)    SpO2 98%    BMI 26.64 kg/m  Filed Weights   10/11/21 1352  Weight: 77.2 kg (170 lb 1.6 oz)    Estimated body mass index is 26.64 kg/m as calculated from the following:   Height as of 09/12/21: 5\' 7"  (1.702 m).   Weight as of this encounter: 77.2  kg (170 lb 1.6 oz).  LABS: CBC:    Component Value Date/Time   WBC 12.7 (H) 10/11/2021 1335   HGB 15.6 (H) 10/11/2021 1335   HCT 46.0 10/11/2021 1335   PLT 154 10/11/2021 1335   MCV 96.0 10/11/2021 1335   NEUTROABS 2.5 10/11/2021 1335   LYMPHSABS 9.5 (H) 10/11/2021 1335   MONOABS 0.5 10/11/2021 1335   EOSABS 0.1 10/11/2021 1335   BASOSABS 0.1 10/11/2021 1335   Comprehensive Metabolic Panel:    Component Value Date/Time   NA 137 07/05/2021 1420   NA 138 05/13/2018 1413   K 3.7 07/05/2021 1420   CL 104 07/05/2021 1420   CO2 27 07/05/2021 1420  BUN 16 07/05/2021 1420   BUN 15 05/13/2018 1413   CREATININE 0.83 07/05/2021 1420   CREATININE 0.96 12/06/2020 1646   GLUCOSE 183 (H) 07/05/2021 1420   CALCIUM 8.9 07/05/2021 1420   AST 20 07/05/2021 1420   ALT 16 07/05/2021 1420   ALKPHOS 59 07/05/2021 1420   BILITOT 0.8 07/05/2021 1420   BILITOT 0.4 05/13/2018 1413   PROT 6.7 07/05/2021 1420   PROT 6.3 05/13/2018 1413   ALBUMIN 4.1 07/05/2021 1420   ALBUMIN 4.1 05/13/2018 1413     Assessment and Plan-  CBC and CMP reviewed with patient, ALC continues to show treatment response. Continue ibrutinib 420mg  daily. Patient reported occasional "knots" in her neck (left side) and under her arms. The knots always disappear. They are occasionally painful but for only a few minutes. I told her to report any knots she find that stay and increase in size   Oral Chemotherapy Side Effect/Intolerance:  Constipation/diarrhea: she occasionaly has both but does not require medications for either Edema: occasionally still occurs, she finds wearing loose fitting shoes helps. No edema present during today's visit Fatigue: present but no change since last office visit No reported nausea    Oral Chemotherapy Adherence: no report missed doses No patient barriers to medication adherence identified.   New medications: none reported  Medication Access Issues: none issues, fills at [facility]  Patient expressed understanding and was in agreement with this plan. She also understands that She can call clinic at any time with any questions, concerns, or complaints.   Thank you for allowing me to participate in the care of this very pleasant patient.   Time Total: 15 mins  Visit consisted of counseling and education on dealing with issues of symptom management in the setting of serious and potentially life-threatening illness.Greater than 50%  of this time was spent counseling and coordinating care related to the above assessment and plan.  Follow-up plan: RTC in 3 months following repeat imagining  Signed by: [name], PharmD, BCPS, BCOP, CPP Hematology/Oncology Clinical Pharmacist Practitioner [place]/HP/AP Oral [facility] [phone]  10/11/2021 1:57 PM

## 2021-10-11 NOTE — Progress Notes (Signed)
Pt reports feeling lumps under her arm and in neck area x2-3wks. Pt also states that she just "doesn't feel right on the inside of my body." X 2-3 days ago.

## 2021-10-16 ENCOUNTER — Ambulatory Visit: Payer: Medicare Other | Admitting: Physical Therapy

## 2021-10-18 ENCOUNTER — Other Ambulatory Visit: Payer: Self-pay | Admitting: Internal Medicine

## 2021-10-18 DIAGNOSIS — G8929 Other chronic pain: Secondary | ICD-10-CM

## 2021-10-18 NOTE — Telephone Encounter (Signed)
Requested medications are due for refill today.  yes  Requested medications are on the active medications list.  yes  Last refill. 09/19/2021  Future visit scheduled.   yes  Notes to clinic.  Medication not delegated.    Requested Prescriptions  Pending Prescriptions Disp Refills   tiZANidine (ZANAFLEX) 2 MG tablet [Pharmacy Med Name: tizanidine 2 mg tablet] 90 tablet 0    Sig: TAKE ONE TABLET BY MOUTH THREE TIMES DAILY     Not Delegated - Cardiovascular:  Alpha-2 Agonists - tizanidine Failed - 10/18/2021  4:27 PM      Failed - This refill cannot be delegated      Passed - Valid encounter within last 6 months    Recent Outpatient Visits           2 months ago COPD exacerbation [facility])   [place], DO   3 months ago No-show for appointment   [place], DO   4 months ago Need for immunization against influenza   [facility] [name], MD   7 months ago Centrilobular emphysema [facility])   [facility] [name], MD   9 months ago Localized swelling of both lower legs   [facility] Trinna Post, [place]       Future Appointments             In 1 week [name], MD [facility], [name]   In 8 months [name], MD [place]

## 2021-10-19 ENCOUNTER — Other Ambulatory Visit: Payer: Self-pay

## 2021-10-19 DIAGNOSIS — M5442 Lumbago with sciatica, left side: Secondary | ICD-10-CM

## 2021-10-19 DIAGNOSIS — G8929 Other chronic pain: Secondary | ICD-10-CM

## 2021-10-23 ENCOUNTER — Other Ambulatory Visit (HOSPITAL_COMMUNITY): Payer: Self-pay

## 2021-10-26 NOTE — Progress Notes (Signed)
Name: Monique [name]   MRN: [id number]    DOB: [birth year]   Date:10/29/2021       Progress Note  Subjective  Chief Complaint  Annual Exam  HPI  Patient presents for annual CPE.  Diet: she feels hungry all the time, but tries not to eat all day. Does not eat balanced  Exercise: discussed 150 minutes per week    [place] Office Visit from 12/27/2020 in [facility]  AUDIT-C Score 0      Depression: Phq 9 is positive, she cried during her visit, she said she did not want to say the truth because she does not want to be charged, explained answering question will not affect the cost of her visit   Depression screen [facility] 2/9 10/29/2021 08/06/2021 06/25/2021 06/19/2021 06/13/2021  Decreased Interest 0 0 0 0 3  Down, Depressed, Hopeless 0 0 0 0 0  PHQ - 2 Score 0 0 0 0 3  Altered sleeping 0 0 0 - 0  Tired, decreased energy 0 0 0 - 3  Change in appetite 0 0 0 - 3  Feeling bad or failure about yourself  0 0 0 - 1  Trouble concentrating 0 0 0 - 3  Moving slowly or fidgety/restless 0 0 0 - 0  Suicidal thoughts 0 0 0 - 0  PHQ-9 Score 0 0 0 - 13  Difficult doing work/chores - Not difficult at all Not difficult at all - -  Some encounter information is confidential and restricted. Go to Review Flowsheets activity to see all data.  Some recent data might be hidden   Hypertension: BP Readings from Last 3 Encounters:  10/29/21 120/68  10/11/21 112/80  09/12/21 120/70   Obesity: Wt Readings from Last 3 Encounters:  10/29/21 171 lb (77.6 kg)  10/11/21 170 lb 1.6 oz (77.2 kg)  09/12/21 171 lb 6.4 oz (77.7 kg)   BMI Readings from Last 3 Encounters:  10/29/21 26.78 kg/m  10/11/21 26.64 kg/m  09/12/21 26.85 kg/m     Vaccines:   HPV: N/A Tdap: up to date Shingrix: had 1 of 2, she does not want the booster  Pneumonia: 2015 Flu: up to date COVID-19: refused    Hep C Screening: 08/31/19 STD testing and prevention (HIV/chl/gon/syphilis):  08/31/19 Intimate partner violence: negative Sexual History : no problem with pain now  Menstrual History/LMP/Abnormal Bleeding: s/p vaginal hysterectomy  Incontinence Symptoms: Yes.  Occasional  - hard cough , discussed kegel exercise   Breast cancer:  - Last Mammogram: 01/16/21 - BRCA gene screening: N/A  Osteoporosis Prevention : Discussed high calcium and vitamin D supplementation, weight bearing exercises Bone density :not applicable  Cervical cancer screening: 10/26/20  Skin cancer: Discussed monitoring for atypical lesions  Colorectal cancer: 04/21/19   Lung cancer: she sees oncologist and will have CT chest soon   Advanced Care Planning: A voluntary discussion about advance care planning including the explanation and discussion of advance directives.  Discussed health care proxy and Living will, and the patient was able to identify a health care proxy as daughter - Traylynn .  Patient does not   Lipids: Lab Results  Component Value Date   CHOL 142 12/06/2020   CHOL 133 08/31/2019   CHOL 178 12/04/2017   Lab Results  Component Value Date   HDL 44 (L) 12/06/2020   HDL 57 08/31/2019   HDL 35 (L) 12/04/2017   Lab Results  Component Value Date   LDLCALC 80 12/06/2020  LDLCALC 63 08/31/2019   LDLCALC 110 (H) 12/04/2017   Lab Results  Component Value Date   TRIG 94 12/06/2020   TRIG 54 08/31/2019   TRIG 209 (H) 12/04/2017   Lab Results  Component Value Date   CHOLHDL 3.2 12/06/2020   CHOLHDL 2.3 08/31/2019   CHOLHDL 5.1 (H) 12/04/2017   No results found for: LDLDIRECT  Glucose: Glucose, Bld  Date Value Ref Range Status  10/11/2021 81 70 - 99 mg/dL Final    Comment:    Glucose reference range applies only to samples taken after fasting for at least 8 hours.  07/05/2021 183 (H) 70 - 99 mg/dL Final    Comment:    Glucose reference range applies only to samples taken after fasting for at least 8 hours.  04/12/2021 89 70 - 99 mg/dL Final    Comment:    Glucose  reference range applies only to samples taken after fasting for at least 8 hours.    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/09/2020   Bilateral leg pain 11/03/2019   Iron deficiency anemia 10/15/2019   Bilateral arm pain 08/05/2019   Bilateral hand numbness 08/05/2019   Weakness of both hands 08/05/2019   Chronic musculoskeletal pain 07/19/2019   Chronic neuropathic pain 07/19/2019   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   Special screening for malignant neoplasms, colon    Polyp of sigmoid colon    Hemorrhoids    Trigeminal neuralgia 10/05/2018   Lumbar radiculitis (Right) 09/24/2018   Hypocalcemia 06/03/2018   Vitamin D insufficiency 06/03/2018   Abnormal MRI, lumbar spine (06/02/2020) 06/03/2018   Chronic hip pain (Right) 06/03/2018   Spondylosis without myelopathy or radiculopathy, lumbar region 06/03/2018   DDD (degenerative disc disease), lumbar 06/02/2018   Lumbar facet hypertrophy 06/02/2018   Lumbar facet arthropathy 06/02/2018   Lumbar facet syndrome (Bilateral) (R>L) 06/02/2018   Marijuana use 05/19/2018   Chronic lower extremity pain (1ry area of Pain) (Bilateral) (R>L) 05/13/2018   Chronic low back pain (2ry area of Pain) (Bilateral) (R>L) w/ sciatica (Bilateral) 05/13/2018   Chronic pain syndrome 05/13/2018   Disorder of skeletal system 05/13/2018   Pharmacologic therapy 05/13/2018   Problems influencing health status 05/13/2018   Mastalgia 05/05/2018   Breast mass, right 01/07/2018    Face pain 12/11/2017   Tingling 12/11/2017   Thoracic aortic atherosclerosis ([place]) 08/20/2017   Emphysema of lung ([place]) 08/20/2017   Adductor tendinitis 04/23/2017   Trochanteric bursitis of right hip 04/23/2017   GERD without esophagitis 12/11/2016   CLL (chronic lymphocytic leukemia) ([place]) 11/24/2016   Pap smear abnormality of cervix/human papillomavirus (HPV) positive 09/12/2016   Stress incontinence 09/04/2016   B12 deficiency 07/10/2015   Insomnia 07/10/2015   Anorexia nervosa, restricting type 07/10/2015   Anxiety, generalized 07/10/2015   H/O suicide attempt 07/10/2015   Lymphocytosis 07/10/2015   Obsessive-compulsive disorder 07/10/2015   Tobacco use 07/10/2015   History of cervical dysplasia 07/18/2014    Past Surgical History:  Procedure Laterality Date   BREAST BIOPSY Right 01/05/2018   US guided biopsy  of 2 areas and 1 lymph node, MIXED INFLAMMATION AND GIANT CELL REACTION   CERVICAL BIOPSY  W/ LOOP ELECTRODE EXCISION     COLONOSCOPY WITH PROPOFOL N/A 04/21/2019   Procedure: COLONOSCOPY WITH PROPOFOL;  Surgeon: Virgel Manifold, MD;  Location: [facility] ENDOSCOPY;  Service: Gastroenterology;  Laterality: N/A;   OTHER SURGICAL HISTORY     scar tissue removed from vocal cords   TUBAL LIGATION     VAGINAL HYSTERECTOMY N/A 01/22/2021   Procedure: HYSTERECTOMY VAGINAL; BILATERAL SALPINGECTOMY;  Surgeon: Rubie Maid, MD;  Location: [facility] ORS;  Service: Gynecology;  Laterality: N/A;   vocal cord surgery  2005    Family History  Problem Relation Age of Onset   Depression Mother    Cancer Mother        thyroid   Alcohol abuse Father    Alcohol abuse Brother    Depression Brother    Bipolar disorder Brother    Suicidality Brother    ADD / ADHD Son    Breast cancer Neg Hx     Social History   Socioeconomic History   Marital status: Divorced    Spouse name: NA   Number of children: 4   Years of education: 12   Highest education level: 12th grade  Occupational  History   Occupation: Disabled   Tobacco Use   Smoking status: Every Day    Packs/day: 2.00    Years: 31.00    Pack years: 62.00    Types: Cigarettes    Start date: 09/25/1986   Smokeless tobacco: Never  Vaping Use   Vaping Use: Former  Substance and Sexual Activity   Alcohol use: Not Currently    Alcohol/week: 0.0 standard drinks    Comment: rarely   Drug use: Yes    Frequency: 7.0 times per week    Types: Marijuana   Sexual activity: Yes    Partners: Male    Birth control/protection: Surgical  Other Topics Concern   Not on file  Social History Narrative   Patient is single and she and her 28yo son live together. Patient is currently on social security disability due to health challenges.     Dad is here from [place] to help her briefly. 3 older daughters live nearby.   Social Determinants of Health   Financial Resource Strain: Low Risk    Difficulty of Paying Living Expenses: Not hard at all  Food Insecurity: No Food Insecurity   Worried About Charity fundraiser in the Last Year: Never true   [place] in the Last Year: Never true  Transportation Needs: No Transportation Needs   Lack of Transportation (Medical): No   Lack of Transportation (Non-Medical): No  Physical Activity: Insufficiently Active   Days of Exercise per Week: 3 days   Minutes of Exercise per Session: 10 min  Stress: Stress Concern Present   Feeling of Stress : Very much  Social Connections: Socially Isolated   Frequency of Communication with Friends and Family: More than three times a week   Frequency of Social Gatherings with Friends and Family: Once a week   Attends Religious Services: Never   Marine scientist or Organizations: No   Attends Archivist Meetings: Never   Marital Status: Divorced  Human resources officer Violence: Not At Risk   Fear of Current or Ex-Partner: No   Emotionally Abused: No   Physically Abused: No   Sexually Abused: No     Current Outpatient  Medications:    Ascorbic Acid (VITAMIN C) 1000 MG tablet, Take 1,000 mg by mouth daily as needed (immune health)., Disp: , Rfl:    atorvastatin (LIPITOR) 10 MG tablet, Take 1 tablet (10 mg total) by mouth in the morning., Disp: 90 tablet, Rfl: 3   benzonatate (TESSALON PERLES) 100 MG capsule, Take 2 capsules (200 mg total) by mouth 2 (two) times daily as needed for cough., Disp: 20 capsule, Rfl: 0   Crisaborole (EUCRISA) 2 % OINT, Apply 1 application topically as directed. Qd to bid aa rash on hands and feet until clear, then prn flares, Disp: 60 g, Rfl: 11   DULoxetine (CYMBALTA) 60 MG capsule, Take 1 capsule (60 mg total) by mouth daily., Disp: 90 capsule, Rfl: 0   estradiol (CLIMARA) 0.06 MG/24HR, 1 patch once a week., Disp: , Rfl:    estradiol (CLIMARA) 0.075 mg/24hr patch, Place 1 patch (0.075 mg total) onto the skin once a week., Disp: 4 patch, Rfl: 12   fluticasone-salmeterol (ADVAIR DISKUS) 250-50 MCG/ACT AEPB, Inhale 1 puff into the lungs in the morning and at bedtime., Disp: 60 each, Rfl: 5   ibrutinib (IMBRUVICA) 420 MG TABS, Take 1 tablet (420 mg) by mouth daily., Disp: 28 tablet, Rfl: 5   loratadine (CLARITIN) 10 MG tablet, Take 1 tablet (10 mg total) by mouth every morning., Disp: 90 tablet, Rfl: 3   Magnesium 500 MG CAPS, Take 500 mg by mouth in the morning., Disp: , Rfl:    meloxicam (MOBIC) 15 MG tablet, Take 1 tablet (15 mg total) by mouth daily., Disp: 90 tablet, Rfl: 1   omeprazole (PRILOSEC) 20 MG capsule, TAKE ONE CAPSULE BY MOUTH EVERY MORNING, Disp: 90 capsule, Rfl: 1   Oxymetazoline HCl (RHOFADE) 1 % CREA, Qam to face, Disp: 30 g, Rfl: 11   pregabalin (LYRICA) 100 MG capsule, Take 100 mg by mouth 3 (three) times daily., Disp: , Rfl:    Ruxolitinib Phosphate (OPZELURA) 1.5 % CREA, Apply 1 application topically as directed. Qd to bid aa rash on hands and feet until clear, then prn flares, Disp: 60 g, Rfl: 11   sertraline (ZOLOFT) 100 MG tablet, TAKE 1 AND 1/2 TABLETS BY MOUTH  EVERY MORNING, Disp: 135 tablet, Rfl: 0   tiZANidine (ZANAFLEX) 2 MG tablet, TAKE ONE TABLET BY MOUTH THREE TIMES DAILY, Disp: 90 tablet, Rfl: 0   vitamin B-12 (CYANOCOBALAMIN) 500 MCG tablet, Take 250 mcg by mouth every morning., Disp: , Rfl:    ARIPiprazole (ABILIFY) 2 MG tablet, Take 1 tablet (2 mg total) by mouth at bedtime., Disp: 30 tablet, Rfl: 0   Calcium Carbonate-Vit D-Min (GNP CALCIUM 1200) 1200-1000 MG-UNIT CHEW, Chew 1,200 mg by mouth daily with breakfast. Take in combination with vitamin D and magnesium. (Patient taking differently: Chew 2 tablets by mouth in the morning, at noon, and at bedtime. Take in combination with vitamin D and magnesium.), Disp: 90 tablet, Rfl: 3  No Known Allergies   ROS  Constitutional: Negative for fever or weight change.  Respiratory: Negative for cough and shortness of breath.   Cardiovascular: Negative for chest pain or palpitations.  Gastrointestinal: Negative for abdominal pain, no bowel changes.  Musculoskeletal: Negative for gait problem , knee pain both  Skin: Negative for rash.  Neurological: Negative for dizziness or headache.  No other specific complaints in a complete review of systems (except as listed in HPI above).   Objective  Vitals:   10/29/21 0928  BP: 120/68  Pulse: 72  Resp:  16  Weight: 171 lb (77.6 kg)  Height: 5' 7" (1.702 m)    Body mass index is 26.78 kg/m.  Physical Exam  Constitutional: Patient appears well-developed and well-nourished. No distress.  HENT: Head: Normocephalic and atraumatic. Ears: B TMs ok, no erythema or effusion; Nose: Not done . Mouth/Throat: not done  Eyes: Conjunctivae and EOM are normal. Pupils are equal, round, and reactive to light. No scleral icterus.  Neck: Normal range of motion. Neck supple. No JVD present. No thyromegaly present.  Cardiovascular: Normal rate, regular rhythm and normal heart sounds.  No murmur heard. No BLE edema. Pulmonary/Chest: Effort normal and breath sounds  normal. No respiratory distress. Abdominal: Soft. Bowel sounds are normal, no distension. There is no tenderness. no masses Breast:lumpy bilaterally ,  no nipple discharge or rashes FEMALE GENITALIA:  Not done  RECTAL: not done  Musculoskeletal: Normal range of motion, no joint effusions. No gross deformities Neurological: he is alert and oriented to person, place, and time. No cranial nerve deficit. Coordination, balance, strength, speech and gait are normal.  Skin: Skin is warm and dry. No rash noted. No erythema. Raised dark spot on right upper back seems to be a keratoma, advised to return for a biopsy, two skin tags on neck and back, also has a cyst on nuchal area left side Psychiatric: Patient has a normal mood and affect. behavior is normal. Judgment and thought content normal.   Recent Results (from the past 2160 hour(s))  Iron and TIBC([facility])     Status: None   Collection Time: 10/11/21  1:35 PM  Result Value Ref Range   Iron 71 28 - 170 ug/dL   TIBC 302 250 - 450 ug/dL   Saturation Ratios 24 10.4 - 31.8 %   UIBC 231 ug/dL    Comment: Performed at [facility], [address] [place], [place] [address]  Comprehensive metabolic panel     Status: Abnormal   Collection Time: 10/11/21  1:35 PM  Result Value Ref Range   Sodium 140 135 - 145 mmol/L   Potassium 4.0 3.5 - 5.1 mmol/L   Chloride 106 98 - 111 mmol/L   CO2 24 22 - 32 mmol/L   Glucose, Bld 81 70 - 99 mg/dL    Comment: Glucose reference range applies only to samples taken after fasting for at least 8 hours.   BUN 29 (H) 6 - 20 mg/dL   Creatinine, Ser 0.81 0.44 - 1.00 mg/dL   Calcium 8.9 8.9 - 10.3 mg/dL   Total Protein 6.6 6.5 - 8.1 g/dL   Albumin 4.0 3.5 - 5.0 g/dL   AST 18 15 - 41 U/L   ALT 12 0 - 44 U/L   Alkaline Phosphatase 57 38 - 126 U/L   Total Bilirubin 0.4 0.3 - 1.2 mg/dL   GFR, Estimated >60 >60 mL/min    Comment: (NOTE) Calculated using the CKD-EPI Creatinine Equation (2021)     Anion gap 10 5 - 15    Comment: Performed at [facility], [address] [place], [place] [address]  CBC with Differential/Platelet     Status: Abnormal   Collection Time: 10/11/21  1:35 PM  Result Value Ref Range   WBC 12.7 (H) 4.0 - 10.5 K/uL   RBC 4.79 3.87 - 5.11 MIL/uL   Hemoglobin 15.6 (H) 12.0 - 15.0 g/dL   HCT 46.0 36.0 - 46.0 %   MCV 96.0 80.0 - 100.0 fL   MCH 32.6 26.0 - 34.0 pg  MCHC 33.9 30.0 - 36.0 g/dL   RDW 13.1 11.5 - 15.5 %   Platelets 154 150 - 400 K/uL   nRBC 0.2 0.0 - 0.2 %   Neutrophils Relative % 19 %   Neutro Abs 2.5 1.7 - 7.7 K/uL   Lymphocytes Relative 76 %   Lymphs Abs 9.5 (H) 0.7 - 4.0 K/uL   Monocytes Relative 4 %   Monocytes Absolute 0.5 0.1 - 1.0 K/uL   Eosinophils Relative 1 %   Eosinophils Absolute 0.1 0.0 - 0.5 K/uL   Basophils Relative 0 %   Basophils Absolute 0.1 0.0 - 0.1 K/uL   Immature Granulocytes 0 %   Abs Immature Granulocytes 0.03 0.00 - 0.07 K/uL    Comment: Performed at [facility], [address] [place], [place] [address]  Ferritin     Status: None   Collection Time: 10/11/21  1:36 PM  Result Value Ref Range   Ferritin 33 11 - 307 ng/mL    Comment: Performed at [facility], [address] [place], [place] [address]     Fall Risk: Fall Risk  10/29/2021 08/06/2021 06/25/2021 06/19/2021 06/13/2021  Falls in the past year? 0 1 1 0 0  Number falls in past yr: 0 0 1 - 0  Injury with Fall? 0 0 0 - 0  Risk for fall due to : No Fall Risks Impaired balance/gait - - No Fall Risks  Follow up Falls prevention discussed Falls prevention discussed - - Falls prevention discussed     Functional Status Survey: Is the patient deaf or have difficulty hearing?: No Does the patient have difficulty seeing, even when wearing glasses/contacts?: No Does the patient have difficulty concentrating, remembering, or making decisions?: No Does the patient have difficulty walking or climbing stairs?: No Does the patient have  difficulty dressing or bathing?: No Does the patient have difficulty doing errands alone such as visiting a doctor's office or shopping?: No   Assessment & Plan  1. Well adult exam   2. Dyslipidemia  - Lipid panel    3. Breast cancer screening by mammogram  - MM 3D SCREEN BREAST BILATERAL; Future  4. Skin lesion of back  Advised to return for a biopsy   -USPSTF grade A and B recommendations reviewed with patient; age-appropriate recommendations, preventive care, screening tests, etc discussed and encouraged; healthy living encouraged; see AVS for patient education given to patient -Discussed importance of 150 minutes of physical activity weekly, eat two servings of fish weekly, eat one serving of tree nuts ( cashews, pistachios, pecans, almonds.Marland Kitchen) every other day, eat 6 servings of fruit/vegetables daily and drink plenty of water and avoid sweet beverages.   -Reviewed Health Maintenance: Yes.

## 2021-10-26 NOTE — Patient Instructions (Signed)
Preventive Care 40-53 Years Old, Female °Preventive care refers to lifestyle choices and visits with your health care provider that can promote health and wellness. Preventive care visits are also called wellness exams. °What can I expect for my preventive care visit? °Counseling °Your health care provider may ask you questions about your: °Medical history, including: °Past medical problems. °Family medical history. °Pregnancy history. °Current health, including: °Menstrual cycle. °Method of birth control. °Emotional well-being. °Home life and relationship well-being. °Sexual activity and sexual health. °Lifestyle, including: °Alcohol, nicotine or tobacco, and drug use. °Access to firearms. °Diet, exercise, and sleep habits. °Work and work environment. °Sunscreen use. °Safety issues such as seatbelt and bike helmet use. °Physical exam °Your health care provider will check your: °Height and weight. These may be used to calculate your BMI (body mass index). BMI is a measurement that tells if you are at a healthy weight. °Waist circumference. This measures the distance around your waistline. This measurement also tells if you are at a healthy weight and may help predict your risk of certain diseases, such as type 2 diabetes and high blood pressure. °Heart rate and blood pressure. °Body temperature. °Skin for abnormal spots. °What immunizations do I need? °Vaccines are usually given at various ages, according to a schedule. Your health care provider will recommend vaccines for you based on your age, medical history, and lifestyle or other factors, such as travel or where you work. °What tests do I need? °Screening °Your health care provider may recommend screening tests for certain conditions. This may include: °Lipid and cholesterol levels. °Diabetes screening. This is done by checking your blood sugar (glucose) after you have not eaten for a while (fasting). °Pelvic exam and Pap test. °Hepatitis B test. °Hepatitis C  test. °HIV (human immunodeficiency virus) test. °STI (sexually transmitted infection) testing, if you are at risk. °Lung cancer screening. °Colorectal cancer screening. °Mammogram. Talk with your health care provider about when you should start having regular mammograms. This may depend on whether you have a family history of breast cancer. °BRCA-related cancer screening. This may be done if you have a family history of breast, ovarian, tubal, or peritoneal cancers. °Bone density scan. This is done to screen for osteoporosis. °Talk with your health care provider about your test results, treatment options, and if necessary, the need for more tests. °Follow these instructions at home: °Eating and drinking ° °Eat a diet that includes fresh fruits and vegetables, whole grains, lean protein, and low-fat dairy products. °Take vitamin and mineral supplements as recommended by your health care provider. °Do not drink alcohol if: °Your health care provider tells you not to drink. °You are pregnant, may be pregnant, or are planning to become pregnant. °If you drink alcohol: °Limit how much you have to 0-1 drink a day. °Know how much alcohol is in your drink. In the [place] one drink equals one 12 oz bottle of beer (355 mL), one 5 oz glass of wine (148 mL), or one 1½ oz glass of hard liquor (44 mL). °Lifestyle °Brush your teeth every morning and night with fluoride toothpaste. Floss one time each day. °Exercise for at least 30 minutes 5 or more days each week. °Do not use any products that contain nicotine or tobacco. These products include cigarettes, chewing tobacco, and vaping devices, such as e-cigarettes. If you need help quitting, ask your health care provider. °Do not use drugs. °If you are sexually active, practice safe sex. Use a condom or other form of protection to prevent   STIs. If you do not wish to become pregnant, use a form of birth control. If you plan to become pregnant, see your health care provider for a  prepregnancy visit. Take aspirin only as told by your health care provider. Make sure that you understand how much to take and what form to take. Work with your health care provider to find out whether it is safe and beneficial for you to take aspirin daily. Find healthy ways to manage stress, such as: Meditation, yoga, or listening to music. Journaling. Talking to a trusted person. Spending time with friends and family. Minimize exposure to UV radiation to reduce your risk of skin cancer. Safety Always wear your seat belt while driving or riding in a vehicle. Do not drive: If you have been drinking alcohol. Do not ride with someone who has been drinking. When you are tired or distracted. While texting. If you have been using any mind-altering substances or drugs. Wear a helmet and other protective equipment during sports activities. If you have firearms in your house, make sure you follow all gun safety procedures. Seek help if you have been physically or sexually abused. What's next? Visit your health care provider once a year for an annual wellness visit. Ask your health care provider how often you should have your eyes and teeth checked. Stay up to date on all vaccines. This information is not intended to replace advice given to you by your health care provider. Make sure you discuss any questions you have with your health care provider. Document Revised: 02/28/2021 Document Reviewed: 02/28/2021 Elsevier Patient Education  [employer].

## 2021-10-29 ENCOUNTER — Encounter: Payer: Self-pay | Admitting: Family Medicine

## 2021-10-29 ENCOUNTER — Other Ambulatory Visit: Payer: Self-pay

## 2021-10-29 ENCOUNTER — Ambulatory Visit (INDEPENDENT_AMBULATORY_CARE_PROVIDER_SITE_OTHER): Payer: Medicare Other | Admitting: Family Medicine

## 2021-10-29 VITALS — BP 120/68 | HR 72 | Resp 16 | Ht 67.0 in | Wt 171.0 lb

## 2021-10-29 DIAGNOSIS — Z0000 Encounter for general adult medical examination without abnormal findings: Secondary | ICD-10-CM

## 2021-10-29 DIAGNOSIS — Z1231 Encounter for screening mammogram for malignant neoplasm of breast: Secondary | ICD-10-CM | POA: Diagnosis not present

## 2021-10-29 DIAGNOSIS — E785 Hyperlipidemia, unspecified: Secondary | ICD-10-CM

## 2021-10-29 DIAGNOSIS — L989 Disorder of the skin and subcutaneous tissue, unspecified: Secondary | ICD-10-CM | POA: Diagnosis not present

## 2021-10-29 LAB — LIPID PANEL
Cholesterol: 151 mg/dL (ref <200)
HDL: 33 mg/dL — ABNORMAL LOW (ref >=50)
LDL Cholesterol (Calc): 100 mg/dL (calc) — ABNORMAL HIGH
Non-HDL Cholesterol (Calc): 118 mg/dL (calc) (ref <130)
Total CHOL/HDL Ratio: 4.6 (calc) (ref <5.0)
Triglycerides: 86 mg/dL (ref <150)

## 2021-10-30 ENCOUNTER — Other Ambulatory Visit (HOSPITAL_COMMUNITY): Payer: Self-pay

## 2021-11-05 ENCOUNTER — Other Ambulatory Visit (HOSPITAL_COMMUNITY): Payer: Self-pay

## 2021-11-06 ENCOUNTER — Ambulatory Visit: Payer: Medicare Other | Attending: Pulmonary Disease

## 2021-11-06 DIAGNOSIS — G478 Other sleep disorders: Secondary | ICD-10-CM | POA: Diagnosis not present

## 2021-11-06 DIAGNOSIS — J449 Chronic obstructive pulmonary disease, unspecified: Secondary | ICD-10-CM | POA: Insufficient documentation

## 2021-11-06 DIAGNOSIS — G2581 Restless legs syndrome: Secondary | ICD-10-CM | POA: Insufficient documentation

## 2021-11-07 ENCOUNTER — Other Ambulatory Visit: Payer: Self-pay

## 2021-11-08 ENCOUNTER — Encounter: Payer: Self-pay | Admitting: Family Medicine

## 2021-11-12 ENCOUNTER — Other Ambulatory Visit: Payer: Self-pay | Admitting: Family Medicine

## 2021-11-16 ENCOUNTER — Other Ambulatory Visit: Payer: Self-pay | Admitting: Family Medicine

## 2021-11-19 ENCOUNTER — Telehealth (INDEPENDENT_AMBULATORY_CARE_PROVIDER_SITE_OTHER): Payer: Medicare Other | Admitting: Pulmonary Disease

## 2021-11-19 DIAGNOSIS — G4733 Obstructive sleep apnea (adult) (pediatric): Secondary | ICD-10-CM

## 2021-11-19 NOTE — Telephone Encounter (Signed)
Very mild OSA - AHI 3/h, RDI 8/h ,mostly supine position ?Does not need treatment except avoid supine if possible ?

## 2021-11-19 NOTE — Telephone Encounter (Signed)
Please let patient know sleep study showed very mild OSA mostly when sleeping on her back, does not need CPAP treatment. Advised side sleeping position, avoid back if able. If she has questions or concerns schedule visit

## 2021-11-20 DIAGNOSIS — G4733 Obstructive sleep apnea (adult) (pediatric): Secondary | ICD-10-CM

## 2021-11-20 NOTE — Telephone Encounter (Signed)
Called and relayed the sleep study results to patient. No questions noted from patient. Noting further needed  ?

## 2021-11-20 NOTE — Telephone Encounter (Signed)
Lm for patient.  

## 2021-11-23 ENCOUNTER — Other Ambulatory Visit: Payer: Self-pay | Admitting: Family Medicine

## 2021-11-23 ENCOUNTER — Other Ambulatory Visit (HOSPITAL_COMMUNITY): Payer: Self-pay

## 2021-11-23 ENCOUNTER — Telehealth: Payer: Self-pay | Admitting: Obstetrics and Gynecology

## 2021-11-23 DIAGNOSIS — M5441 Lumbago with sciatica, right side: Secondary | ICD-10-CM

## 2021-11-23 DIAGNOSIS — N63 Unspecified lump in unspecified breast: Secondary | ICD-10-CM

## 2021-11-23 DIAGNOSIS — G8929 Other chronic pain: Secondary | ICD-10-CM

## 2021-11-23 NOTE — Telephone Encounter (Signed)
Pt called requesting to switch from the patch (for hot flashes) to the pill as patch is causing skin irritation. Please Advise.  ?

## 2021-11-26 MED ORDER — ESTROGENS CONJUGATED 0.625 MG PO TABS: 0.6250 mg | ORAL_TABLET | Freq: Every day | ORAL | 3 refills | Status: DC

## 2021-11-26 NOTE — Telephone Encounter (Signed)
Please inform patient that I have changed her prescription to pills.  ?

## 2021-11-28 ENCOUNTER — Other Ambulatory Visit (HOSPITAL_COMMUNITY): Payer: Self-pay

## 2021-11-28 ENCOUNTER — Telehealth: Payer: Self-pay | Admitting: Family Medicine

## 2021-11-28 DIAGNOSIS — G8929 Other chronic pain: Secondary | ICD-10-CM

## 2021-11-28 DIAGNOSIS — K219 Gastro-esophageal reflux disease without esophagitis: Secondary | ICD-10-CM

## 2021-11-28 MED ORDER — OMEPRAZOLE 20 MG PO CPDR: 20.0000 mg | DELAYED_RELEASE_CAPSULE | Freq: Every morning | ORAL | 3 refills | Status: DC

## 2021-11-28 NOTE — Telephone Encounter (Signed)
Requested medications are due for refill today.  yes ? ?Requested medications are on the active medications list.  yes ? ?Last refill. 10/19/2021 #90 0 refills ? ?Future visit scheduled.   no ? ?Notes to clinic.  Medication is not delegated. ? ? ? ?Requested Prescriptions  ?Pending Prescriptions Disp Refills  ? tiZANidine (ZANAFLEX) 2 MG tablet 90 tablet 0  ?  Sig: Take 1 tablet (2 mg total) by mouth 3 (three) times daily.  ?  ? Not Delegated - Cardiovascular:  Alpha-2 Agonists - tizanidine Failed - 11/28/2021  4:25 PM  ?  ?  Failed - This refill cannot be delegated  ?  ?  Passed - Valid encounter within last 6 months  ?  Recent Outpatient Visits   ? ?      ? 1 month ago Well adult exam  ? [facility] [place], Drue Stager, MD  ? 3 months ago COPD exacerbation [facility])  ? Vincent, DO  ? 5 months ago No-show for appointment  ? White, DO  ? 5 months ago Need for immunization against influenza  ? [facility] [place], Drue Stager, MD  ? 8 months ago Centrilobular emphysema [facility])  ? [facility] [name], MD  ? ?  ?  ?Future Appointments   ? ?        ? In 5 months [name], MD [place]  ? In 7 months [name], MD [place]  ? ?  ? ?  ?  ?  ?Signed Prescriptions Disp Refills  ? omeprazole (PRILOSEC) 20 MG capsule 90 capsule 3  ?  Sig: Take 1 capsule (20 mg total) by mouth every morning.  ?  ? Gastroenterology: Proton Pump Inhibitors Passed - 11/28/2021  4:25 PM  ?  ?  Passed - Valid encounter within last 12 months  ?  Recent Outpatient Visits   ? ?      ? 1 month ago Well adult exam  ? [facility] [place], Drue Stager, MD  ? 3 months ago COPD exacerbation [facility])  ? [place], DO  ? 5 months ago No-show for appointment  ? [place], DO  ? 5 months ago Need  for immunization against influenza  ? [facility] [place], Drue Stager, MD  ? 8 months ago Centrilobular emphysema [facility])  ? [facility] [name], MD  ? ?  ?  ?Future Appointments   ? ?        ? In 5 months [name], MD [place]  ? In 7 months [name], MD [place]  ? ?  ? ?  ?  ?  ?  ?

## 2021-11-28 NOTE — Telephone Encounter (Signed)
Requested Prescriptions  ?Pending Prescriptions Disp Refills  ?? omeprazole (PRILOSEC) 20 MG capsule 90 capsule 3  ?  Sig: Take 1 capsule (20 mg total) by mouth every morning.  ?  ? Gastroenterology: Proton Pump Inhibitors Passed - 11/28/2021  4:25 PM  ?  ?  Passed - Valid encounter within last 12 months  ?  Recent Outpatient Visits   ?      ? 1 month ago Well adult exam  ? [facility] [place], Drue Stager, MD  ? 3 months ago COPD exacerbation [facility])  ? [place], DO  ? 5 months ago No-show for appointment  ? [place], DO  ? 5 months ago Need for immunization against influenza  ? [facility] [place], Drue Stager, MD  ? 8 months ago Centrilobular emphysema [facility])  ? [facility] [name], MD  ?  ?  ?Future Appointments   ?        ? In 5 months [name], MD [place]  ? In 7 months [name], MD [place]  ?  ? ?  ?  ?  ?? tiZANidine (ZANAFLEX) 2 MG tablet 90 tablet 0  ?  Sig: Take 1 tablet (2 mg total) by mouth 3 (three) times daily.  ?  ? Not Delegated - Cardiovascular:  Alpha-2 Agonists - tizanidine Failed - 11/28/2021  4:25 PM  ?  ?  Failed - This refill cannot be delegated  ?  ?  Passed - Valid encounter within last 6 months  ?  Recent Outpatient Visits   ?      ? 1 month ago Well adult exam  ? [facility] [place], Drue Stager, MD  ? 3 months ago COPD exacerbation [facility])  ? [place], DO  ? 5 months ago No-show for appointment  ? [place], DO  ? 5 months ago Need for immunization against influenza  ? [facility] [place], Drue Stager, MD  ? 8 months ago Centrilobular emphysema [facility])  ? [facility] [name], MD  ?  ?  ?Future Appointments   ?        ? In 5 months [name], MD [place]  ? In  7 months [name], MD [place]  ?  ? ?  ?  ?  ? ?

## 2021-11-28 NOTE — Telephone Encounter (Signed)
Copied from [place] [id number]. Topic: Quick Communication - Rx Refill/Question ?>> Nov 28, 2021  3:06 PM Leward Quan A wrote: ?Medication: tiZANidine (ZANAFLEX) 2 MG tablet, omeprazole (PRILOSEC) 20 MG capsule ? ?Has the patient contacted their pharmacy? Yes.  Pharmacy said Dr denied refill  ?(Agent: If no, request that the patient contact the pharmacy for the refill. If patient does not wish to contact the pharmacy document the reason why and proceed with request.) ?(Agent: If yes, when and what did the pharmacy advise?) ? ?Preferred Pharmacy (with phone number or street name): [facility] - [place], [place] - [place] [address]  ?Phone:  [phone] ?Fax:  [phone] ? ? ? ?Has the patient been seen for an appointment in the last year OR does the patient have an upcoming appointment? Yes.   ? ?Agent: Please be advised that RX refills may take up to 3 business days. We ask that you follow-up with your pharmacy. ?

## 2021-12-02 ENCOUNTER — Emergency Department
Admission: EM | Admit: 2021-12-02 | Discharge: 2021-12-02 | Disposition: A | Discharge status: Home / Self Care | Payer: Medicare Other | Attending: Emergency Medicine | Admitting: Emergency Medicine

## 2021-12-02 ENCOUNTER — Other Ambulatory Visit: Payer: Self-pay

## 2021-12-02 ENCOUNTER — Emergency Department: Payer: Medicare Other

## 2021-12-02 DIAGNOSIS — Z8616 Personal history of COVID-19: Secondary | ICD-10-CM | POA: Insufficient documentation

## 2021-12-02 DIAGNOSIS — W1839XA Other fall on same level, initial encounter: Secondary | ICD-10-CM | POA: Diagnosis not present

## 2021-12-02 DIAGNOSIS — J449 Chronic obstructive pulmonary disease, unspecified: Secondary | ICD-10-CM | POA: Insufficient documentation

## 2021-12-02 DIAGNOSIS — Z856 Personal history of leukemia: Secondary | ICD-10-CM | POA: Insufficient documentation

## 2021-12-02 DIAGNOSIS — S322XXA Fracture of coccyx, initial encounter for closed fracture: Secondary | ICD-10-CM | POA: Insufficient documentation

## 2021-12-02 DIAGNOSIS — S3992XA Unspecified injury of lower back, initial encounter: Secondary | ICD-10-CM | POA: Diagnosis present

## 2021-12-02 DIAGNOSIS — F17200 Nicotine dependence, unspecified, uncomplicated: Secondary | ICD-10-CM | POA: Diagnosis not present

## 2021-12-02 MED ORDER — OXYCODONE-ACETAMINOPHEN 5-325 MG PO TABS
1.0000 | ORAL_TABLET | Freq: Once | ORAL | Status: AC
  Administered 2021-12-02: 1 via ORAL
  Filled 2021-12-02: qty 1

## 2021-12-02 MED ORDER — IBUPROFEN 400 MG PO TABS
400.0000 mg | ORAL_TABLET | Freq: Once | ORAL | Status: AC
  Administered 2021-12-02: 400 mg via ORAL
  Filled 2021-12-02: qty 1

## 2021-12-02 MED ORDER — LIDOCAINE 5 % EX PTCH
1.0000 | MEDICATED_PATCH | CUTANEOUS | Status: DC
  Administered 2021-12-02: 1 via TRANSDERMAL
  Filled 2021-12-02: qty 1

## 2021-12-02 MED ORDER — LIDOCAINE 5 % EX PTCH: 1.0000 | MEDICATED_PATCH | CUTANEOUS | 0 refills | Status: DC

## 2021-12-02 MED ORDER — OXYCODONE HCL 5 MG PO TABS: 5.0000 mg | ORAL_TABLET | Freq: Three times a day (TID) | ORAL | 0 refills | Status: DC | PRN

## 2021-12-02 NOTE — ED Triage Notes (Signed)
Pt states she fell onto her buttocks on concrete around 1 am, pt denies hitting head or LOC. Pt states it was a mechanical fall. Pt states she took tylenol this am with no relief. Pt able to walk in triage, states it hurts worse to sit. Pt states pain is in tailbone area. Pt denies pain in hips or legs.  ?

## 2021-12-02 NOTE — Discharge Instructions (Addendum)
The x-ray was equivocal for possible fracture to your coccyx.  You can continue to take the meloxicam for your tailbone pain.  You can take the oxycodone as needed for breakthrough pain.  You can also use a Lidoderm patch. ?

## 2021-12-02 NOTE — ED Notes (Signed)
See triage note  states she took a fall this am   states she fell buttocks first on concrete  having pain to tailbone   ?

## 2021-12-02 NOTE — ED Provider Notes (Signed)
? ?[facility] ?Provider Note ? ? ? Event Date/Time  ? First MD Initiated Contact with Patient 12/02/21 1355   ?  (approximate) ? ? ?History  ? ?Fall ? ? ?HPI ? ?Monique [name] is a 51 y.o. female with past medical history of CLL, COPD, neuropathy, lumbar disc disease who presents with pain of her tailbone.  Patient had a mechanical fall yesterday evening.  She fell directly onto her buttocks on the concrete.  Denies hitting her head or other injury.  Since that time she has had pain in the gluteal fold.  Pain is worse with movement and with lying directly on the area.  Denies any new numbness or any weakness in her extremities no bowel or bladder incontinence or fever.  She takes gabapentin and meloxicam for her neuropathy.  Has otherwise tried Tylenol for the acute pain which is not working. ?  ? ?Past Medical History:  ?Diagnosis Date  ? Anxiety   ? Bursitis   ? leg pain  ? Carpal tunnel syndrome   ? Cervical dysplasia   ? hx LEEP over 18 years ago.   ? 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 Dr Grayland Ormond.  (in lymph nodes)  ? COPD (chronic obstructive pulmonary disease) ([place])   ? COVID-19 2021  ? Depression   ? Eating disorder   ? GERD (gastroesophageal reflux disease)   ? History of self-harm   ? Insomnia   ? Obsession   ? Tobacco abuse   ? Vitamin B12 deficiency (non anemic)   ? ? ?Patient Active Problem List  ? Diagnosis Date Noted  ? At risk for sleep apnea 09/19/2021  ? Lumbar foraminal stenosis (L4-5) (Bilateral) (R>L) 06/05/2021  ? 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 Lumbosacral radiculopathy/radiculitis at L5 (Bilateral) 06/05/2021  ? PAD (peripheral artery disease) ([place]) 02/14/2021  ? CIN II (cervical intraepithelial neoplasia II) 02/01/2021  ? S/P vaginal hysterectomy 01/22/2021  ? Acute right hip pain 06/05/2020  ? 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 Bilateral leg pain 11/03/2019  ? Iron deficiency anemia 10/15/2019   ? Bilateral arm pain 08/05/2019  ? Bilateral hand numbness 08/05/2019  ? Weakness of both hands 08/05/2019  ? Chronic musculoskeletal pain 07/19/2019  ? Chronic neuropathic pain 07/19/2019  ? 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 Special screening for malignant neoplasms, colon   ? Polyp of sigmoid colon   ? Hemorrhoids   ? Trigeminal neuralgia 10/05/2018  ? Lumbar radiculitis (Right) 09/24/2018  ? Hypocalcemia 06/03/2018  ? Vitamin D insufficiency 06/03/2018  ? Abnormal MRI, lumbar spine (06/02/2020) 06/03/2018  ? Chronic hip pain (Right) 06/03/2018  ? Spondylosis without myelopathy or radiculopathy, lumbar region 06/03/2018  ? DDD (degenerative disc disease), lumbar 06/02/2018  ? Lumbar facet hypertrophy 06/02/2018  ? Lumbar facet arthropathy 06/02/2018  ? Lumbar facet syndrome (Bilateral) (R>L) 06/02/2018  ? Marijuana use 05/19/2018  ? Chronic lower extremity pain (1ry area of Pain) (Bilateral) (R>L) 05/13/2018  ? Chronic low back pain (2ry area of Pain) (Bilateral) (R>L) w/ sciatica (Bilateral) 05/13/2018  ? Chronic pain syndrome 05/13/2018  ? Disorder of skeletal system 05/13/2018  ? Pharmacologic therapy 05/13/2018  ? Problems influencing health status 05/13/2018  ? Mastalgia 05/05/2018  ? Breast mass, right 01/07/2018  ? Face pain 12/11/2017  ? Tingling 12/11/2017  ? Thoracic aortic atherosclerosis ([place]) 08/20/2017  ? Emphysema of lung ([place]) 08/20/2017  ? Adductor tendinitis 04/23/2017  ? Trochanteric bursitis of right hip  04/23/2017  ? GERD without esophagitis 12/11/2016  ? CLL (chronic lymphocytic leukemia) ([place]) 11/24/2016  ? Stress incontinence 09/04/2016  ? B12 deficiency 07/10/2015  ? Insomnia 07/10/2015  ? Anorexia nervosa, restricting type 07/10/2015  ? Anxiety, generalized 07/10/2015  ? H/O suicide attempt 07/10/2015  ? Lymphocytosis 07/10/2015  ? Obsessive-compulsive disorder 07/10/2015  ? Tobacco use 07/10/2015  ? History of cervical dysplasia 07/18/2014  ? ? ? ?Physical Exam  ?Triage Vital  Signs: ?ED Triage Vitals  ?Enc Vitals Group  ?   BP 12/02/21 1338 (!) 121/49  ?   Pulse Rate 12/02/21 1338 70  ?   Resp 12/02/21 1338 20  ?   Temp 12/02/21 1338 98.9 ?F (37.2 ?C)  ?   Temp Source 12/02/21 1338 Oral  ?   SpO2 12/02/21 1338 99 %  ?   Weight 12/02/21 1339 170 lb (77.1 kg)  ?   Height 12/02/21 1339 '5\' 7"'$  (1.702 m)  ?   Head Circumference --   ?   Peak Flow --   ?   Pain Score 12/02/21 1339 10  ?   Pain Loc --   ?   Pain Edu? --   ?   Excl. in [place]? --   ? ? ?Most recent vital signs: ?Vitals:  ? 12/02/21 1338  ?BP: (!) 121/49  ?Pulse: 70  ?Resp: 20  ?Temp: 98.9 ?F (37.2 ?C)  ?SpO2: 99%  ? ? ? ?General: Awake, no distress.  ?CV:  Good peripheral perfusion.  ?Resp:  Normal effort.  ?Abd:  No distention.  ?Neuro:             Awake, Alert, Oriented x 3  ?Other:  TTP over the midline at the gluteal fold no overlying skin changes or ecchymosis ?5/5 strength in the bilateral extremities with dorsiflexion, plantarflexion and hip extension, sensation grossly intact in the bilateral lower extremities ? ? ?ED Results / Procedures / Treatments  ?Labs ?(all labs ordered are listed, but only abnormal results are displayed) ?Labs Reviewed - No data to display ? ? ?EKG ? ? ? ? ?RADIOLOGY ?I reviewed the x-ray of the sacrum/coccyx which is equivocal for a nondisplaced coccygeal fracture ? ? ?PROCEDURES: ? ?Critical Care performed: No ? ?Procedures ? ? ?MEDICATIONS ORDERED IN ED: ?Medications  ?lidocaine (LIDODERM) 5 % 1 patch (1 patch Transdermal Patch Applied 12/02/21 1427)  ?ibuprofen (ADVIL) tablet 400 mg (has no administration in time range)  ?oxyCODONE-acetaminophen (PERCOCET/ROXICET) 5-325 MG per tablet 1 tablet (has no administration in time range)  ? ? ? ?IMPRESSION / MDM / ASSESSMENT AND PLAN / ED COURSE  ?I reviewed the triage vital signs and the nursing notes. ?             ?               ? ?Differential diagnosis includes, but is not limited to, fracture of the coccyx, contusion ? ?The patient is a 51 year old  female presents 1 day after a fall directly onto the buttocks with pain over the coccyx region.  She has no signs or symptoms of cauda equina syndrome.  On exam she is tender really directly over the coccyx at the gluteal fold without ecchymosis.  She has good strength and sensation in her extremities.  X-ray obtained of the coccyx this is somewhat equivocal for nondisplaced upper coccygeal fracture.  Given the area of tenderness I suspect that this does represent a true fracture.  Patient already taking meloxicam advise she continue  Tylenol and will prescribe Lidoderm patch as well as as needed oxycodone.  Discussed supportive care and sitting on a donut cushion.  She is appropriate for discharge. ? ?Clinical Course as of 12/02/21 1429  ?Sun Dec 02, 2021  ?1417 DG Sacrum/Coccyx [KM]  ?  ?Clinical Course User Index ?[KM] Rada Hay, MD  ? ? ? ?FINAL CLINICAL IMPRESSION(S) / ED DIAGNOSES  ? ?Final diagnoses:  ?Closed fracture of coccyx, initial encounter ([place])  ? ? ? ?Rx / DC Orders  ? ?ED Discharge Orders   ? ?      Ordered  ?  oxyCODONE (ROXICODONE) 5 MG immediate release tablet  Every 8 hours PRN       ? 12/02/21 1423  ?  lidocaine (LIDODERM) 5 %  Every 24 hours       ? 12/02/21 1423  ? ?  ?  ? ?  ? ? ? ?Note:  This document was prepared using Dragon voice recognition software and may include unintentional dictation errors. ?  ?Rada Hay, MD ?12/02/21 1429 ? ?

## 2021-12-03 ENCOUNTER — Other Ambulatory Visit: Payer: Self-pay

## 2021-12-03 ENCOUNTER — Other Ambulatory Visit: Payer: Self-pay | Admitting: Family Medicine

## 2021-12-03 DIAGNOSIS — M7918 Myalgia, other site: Secondary | ICD-10-CM

## 2021-12-03 DIAGNOSIS — G8929 Other chronic pain: Secondary | ICD-10-CM

## 2021-12-03 MED ORDER — TIZANIDINE HCL 2 MG PO TABS: 2.0000 mg | ORAL_TABLET | Freq: Three times a day (TID) | ORAL | 0 refills | Status: DC

## 2021-12-03 NOTE — Telephone Encounter (Signed)
Copied from [place] [id number]. Topic: General - Other ?>> Dec 03, 2021  9:05 AM Tessa Lerner A wrote: ?Reason for CRM: The patient has called for an iupdate on their previously requested prescriptions of tiZANidine (ZANAFLEX) 2 MG tablet [[id number]]  and meloxicam (MOBIC) 15 MG tablet [[id number]]  ? ?The patient has stressed the urgency of their request  ? ?Please contact further when available ?

## 2021-12-03 NOTE — Telephone Encounter (Signed)
Spoke with patient and she has an appointment already scheduled with Serafina Royals for 3.23.2023. please call in her medication or give her enough to last until her appt. Pt was seen at ED due to broken tail bone

## 2021-12-04 NOTE — Telephone Encounter (Signed)
Pt is also needing refill on her meloxicam she is in pain

## 2021-12-06 ENCOUNTER — Encounter: Payer: Self-pay | Admitting: Nurse Practitioner

## 2021-12-06 ENCOUNTER — Ambulatory Visit (INDEPENDENT_AMBULATORY_CARE_PROVIDER_SITE_OTHER): Payer: Medicare Other | Admitting: Nurse Practitioner

## 2021-12-06 VITALS — BP 118/72 | HR 96 | Temp 98.5°F | Resp 16 | Ht 67.0 in | Wt 172.9 lb

## 2021-12-06 DIAGNOSIS — S322XXD Fracture of coccyx, subsequent encounter for fracture with routine healing: Secondary | ICD-10-CM

## 2021-12-06 DIAGNOSIS — G8929 Other chronic pain: Secondary | ICD-10-CM

## 2021-12-06 DIAGNOSIS — R051 Acute cough: Secondary | ICD-10-CM | POA: Diagnosis not present

## 2021-12-06 DIAGNOSIS — M5442 Lumbago with sciatica, left side: Secondary | ICD-10-CM | POA: Diagnosis not present

## 2021-12-06 DIAGNOSIS — M7918 Myalgia, other site: Secondary | ICD-10-CM | POA: Diagnosis not present

## 2021-12-06 DIAGNOSIS — M5441 Lumbago with sciatica, right side: Secondary | ICD-10-CM

## 2021-12-06 MED ORDER — BENZONATATE 100 MG PO CAPS: 100.0000 mg | ORAL_CAPSULE | Freq: Two times a day (BID) | ORAL | 0 refills | Status: DC | PRN

## 2021-12-06 MED ORDER — TIZANIDINE HCL 2 MG PO TABS: 2.0000 mg | ORAL_TABLET | Freq: Three times a day (TID) | ORAL | 0 refills | Status: DC

## 2021-12-06 MED ORDER — MELOXICAM 15 MG PO TABS: 15.0000 mg | ORAL_TABLET | Freq: Every day | ORAL | 1 refills | Status: DC

## 2021-12-06 MED ORDER — TRAMADOL HCL 50 MG PO TABS: 50.0000 mg | ORAL_TABLET | Freq: Three times a day (TID) | ORAL | 0 refills | Status: AC | PRN

## 2021-12-06 NOTE — Progress Notes (Signed)
? ?BP 118/72   Pulse 96   Temp 98.5 ?F (36.9 ?C) (Oral)   Resp 16   Ht '5\' 7"'$  (1.702 m)   Wt 172 lb 14.4 oz (78.4 kg)   LMP  (LMP Unknown)   SpO2 100%   BMI 27.08 kg/m?   ? ?Subjective:  ? ? Patient ID: Monique [name], female    DOB: Mar 23, 1971, 51 y.o.   MRN: [id number] ? ?HPI: ?Monique [name] is a 51 y.o. female, here alone ? ?Chief Complaint  ?Patient presents with  ? Hospitalization Follow-up  ?  Pt had a recent fall, pt states she fractured her tail bone.  ? ?ER follow-up:  Was seen on 12/02/2021 after a fall on her butt. She says she was walking around a parking lot and she tripped on something and landed on her butt. She was seen at [facility].  She had a sacrum/coccyx xray which showed Equivocal nondisplaced UPPER coccygeal fracture. She tried taking oxycodone but it did not help. Will try a course of tramadol to help with the pain for a short period of time. She does have a coccyx pillow to help with support.  Showed patient her xrays and discussed healing time. She verbalized understanding.  ? ?Chronic back pain/muscle pain: she takes meloxicam and tizanidine for her chronic back and muscle pain.  She says she needs some refills. Refills sent. She did say that her pain is increased due to the position she has to sit in due to her new fracture.  ? ?Cough: She says she has had a cough for awhile and would like tessalon perls to help with her cough.  She denies any shortness of breath or fever.  Will send in prescription of tessalon perls for her.  ? ?Relevant past medical, surgical, family and social history reviewed and updated as indicated. Interim medical history since our last visit reviewed. ?Allergies and medications reviewed and updated. ? ?Review of Systems ? ?Constitutional: Negative for fever or weight change.  ?Respiratory: positive  for cough and negative for shortness of breath.   ?Cardiovascular: Negative for chest pain or palpitations.  ?Gastrointestinal: Negative for abdominal pain, no  bowel changes.  ?Musculoskeletal: Negative for gait problem or joint swelling.  ?Skin: Negative for rash.  ?Neurological: Negative for dizziness or headache.  ?No other specific complaints in a complete review of systems (except as listed in HPI above).  ? ?   ?Objective:  ?  ?BP 118/72   Pulse 96   Temp 98.5 ?F (36.9 ?C) (Oral)   Resp 16   Ht '5\' 7"'$  (1.702 m)   Wt 172 lb 14.4 oz (78.4 kg)   LMP  (LMP Unknown)   SpO2 100%   BMI 27.08 kg/m?   ?Wt Readings from Last 3 Encounters:  ?12/06/21 172 lb 14.4 oz (78.4 kg)  ?12/02/21 170 lb (77.1 kg)  ?10/29/21 171 lb (77.6 kg)  ?  ?Physical Exam ? ?Constitutional: Patient appears well-developed and well-nourished. No distress.  ?HEENT: head atraumatic, normocephalic, pupils equal and reactive to light,  neck supple ?Cardiovascular: Normal rate, regular rhythm and normal heart sounds.  No murmur heard. No BLE edema. ?Pulmonary/Chest: Effort normal and breath sounds normal. No respiratory distress. ?Abdominal: Soft.  There is no tenderness. ?Psychiatric: Patient has a normal mood and affect. behavior is normal. Judgment and thought content normal.  ? ?Results for orders placed or performed in visit on 10/29/21  ?Lipid panel  ?Result Value Ref Range  ? Cholesterol 151 <200  mg/dL  ? HDL 33 (L) > OR = 50 mg/dL  ? Triglycerides 86 <150 mg/dL  ? LDL Cholesterol (Calc) 100 (H) mg/dL (calc)  ? Total CHOL/HDL Ratio 4.6 <5.0 (calc)  ? Non-HDL Cholesterol (Calc) 118 <130 mg/dL (calc)  ? ?   ?Assessment & Plan:  ? ?1. Closed fracture of coccyx with routine healing, subsequent encounter ?-do not take the oxycodone ?- traMADol (ULTRAM) 50 MG tablet; Take 1 tablet (50 mg total) by mouth every 8 (eight) hours as needed for up to 7 days.  Dispense: 20 tablet; Refill: 0 ? ?2. Chronic low back pain (Secondary Area of Pain) (Bilateral) (R>L) w/ sciatica (Bilateral) ? ?- meloxicam (MOBIC) 15 MG tablet; Take 1 tablet (15 mg total) by mouth daily.  Dispense: 90 tablet; Refill: 1 ?-  tiZANidine (ZANAFLEX) 2 MG tablet; Take 1 tablet (2 mg total) by mouth 3 (three) times daily.  Dispense: 30 tablet; Refill: 0 ? ?3. Chronic musculoskeletal pain ? ?- meloxicam (MOBIC) 15 MG tablet; Take 1 tablet (15 mg total) by mouth daily.  Dispense: 90 tablet; Refill: 1 ?- tiZANidine (ZANAFLEX) 2 MG tablet; Take 1 tablet (2 mg total) by mouth 3 (three) times daily.  Dispense: 30 tablet; Refill: 0 ? ?4. Acute cough ? ?- benzonatate (TESSALON) 100 MG capsule; Take 1 capsule (100 mg total) by mouth 2 (two) times daily as needed for cough.  Dispense: 20 capsule; Refill: 0  ? ?Follow up plan: ?Return if symptoms worsen or fail to improve. ? ? ? ? ? ?

## 2021-12-13 ENCOUNTER — Other Ambulatory Visit: Payer: Self-pay | Admitting: Family Medicine

## 2021-12-13 ENCOUNTER — Ambulatory Visit
Admission: RE | Admit: 2021-12-13 | Discharge: 2021-12-13 | Disposition: A | Discharge status: Home / Self Care | Payer: Medicare Other | Source: Ambulatory Visit | Attending: Family Medicine | Admitting: Family Medicine

## 2021-12-13 DIAGNOSIS — N63 Unspecified lump in unspecified breast: Secondary | ICD-10-CM | POA: Diagnosis present

## 2021-12-13 DIAGNOSIS — N6321 Unspecified lump in the left breast, upper outer quadrant: Secondary | ICD-10-CM | POA: Insufficient documentation

## 2021-12-13 DIAGNOSIS — N6311 Unspecified lump in the right breast, upper outer quadrant: Secondary | ICD-10-CM | POA: Diagnosis not present

## 2021-12-13 DIAGNOSIS — N6322 Unspecified lump in the left breast, upper inner quadrant: Secondary | ICD-10-CM | POA: Insufficient documentation

## 2021-12-13 DIAGNOSIS — N6312 Unspecified lump in the right breast, upper inner quadrant: Secondary | ICD-10-CM | POA: Diagnosis not present

## 2021-12-13 DIAGNOSIS — L299 Pruritus, unspecified: Secondary | ICD-10-CM

## 2021-12-18 ENCOUNTER — Other Ambulatory Visit: Payer: Self-pay | Admitting: Family Medicine

## 2021-12-20 ENCOUNTER — Other Ambulatory Visit (HOSPITAL_COMMUNITY): Payer: Self-pay

## 2021-12-25 ENCOUNTER — Other Ambulatory Visit (HOSPITAL_COMMUNITY): Payer: Self-pay

## 2021-12-31 ENCOUNTER — Ambulatory Visit: Payer: Self-pay

## 2021-12-31 NOTE — Telephone Encounter (Signed)
?  Chief Complaint: wound ?Symptoms: L knee wounds, redness around wound ?Frequency: 4 days ?Pertinent Negatives: NA ?Disposition: '[]'$ ED /'[]'$ Urgent Care (no appt availability in office) / '[x]'$ Appointment(In office/virtual)/ '[]'$  [facility]/ '[]'$ Home Care/ '[]'$ Refused Recommended Disposition /'[]'$ [place] [facility]/ '[]'$  Follow-up with PCP ?Additional Notes: pt has been cleaning the wounds on her L knee but has gotten worse and red. Pt didn't want to come in today so scheduled appt for 01/01/22 at 1020. ? ? ?Reason for Disposition ? [1] No prior tetanus shots (or is not fully vaccinated) AND [2] any wound (e.g., cut or scrape) ? ?Answer Assessment - Initial Assessment Questions ?1. MECHANISM: "How did the fall happen?" ?    Tripped and fell while taking trash can out ?3. ONSET: "When did the fall happen?" (e.g., minutes, hours, or days ago) ?    12/27/21 ?5. INJURY: "Did you hurt (injure) yourself when you fell?" If Yes, ask: "What did you injure? Tell me more about this?" (e.g., body area; type of injury; pain severity)" ?    L knee and R knee ?6. PAIN: "Is there any pain?" If Yes, ask: "How bad is the pain?" (e.g., Scale 1-10; or mild,  ?moderate, severe) ?  - NONE (0): No pain ?  - MILD (1-3): Doesn't interfere with normal activities  ?  - MODERATE (4-7): Interferes with normal activities or awakens from sleep  ?  - SEVERE (8-10): Excruciating pain, unable to do any normal activities  ?    7 ?7. SIZE: For cuts, bruises, or swelling, ask: "How large is it?" (e.g., inches or centimeters)  ?    2 L knee nickel and quarter  ?9. OTHER SYMPTOMS: "Do you have any other symptoms?" (e.g., dizziness, fever, weakness; new onset or worsening).  ?    No ? ?Protocols used: Falls and Falling-A-AH ? ?

## 2022-01-01 ENCOUNTER — Other Ambulatory Visit: Payer: Self-pay

## 2022-01-01 ENCOUNTER — Ambulatory Visit (INDEPENDENT_AMBULATORY_CARE_PROVIDER_SITE_OTHER): Payer: Medicare Other | Admitting: Family Medicine

## 2022-01-01 ENCOUNTER — Encounter: Payer: Self-pay | Admitting: Family Medicine

## 2022-01-01 VITALS — BP 124/76 | HR 93 | Temp 97.7°F | Resp 18 | Ht 67.0 in | Wt 170.8 lb

## 2022-01-01 DIAGNOSIS — F411 Generalized anxiety disorder: Secondary | ICD-10-CM

## 2022-01-01 DIAGNOSIS — Z9151 Personal history of suicidal behavior: Secondary | ICD-10-CM

## 2022-01-01 MED ORDER — ARIPIPRAZOLE 5 MG PO TABS: 5.0000 mg | ORAL_TABLET | Freq: Every day | ORAL | 0 refills | Status: DC

## 2022-01-01 MED ORDER — CEPHALEXIN 500 MG PO CAPS: 500.0000 mg | ORAL_CAPSULE | Freq: Four times a day (QID) | ORAL | 0 refills | Status: AC

## 2022-01-01 NOTE — Assessment & Plan Note (Signed)
Worsened. Will trial abilify dose increase. F/u in 4 weeks.  ?

## 2022-01-01 NOTE — Progress Notes (Signed)
? ?  SUBJECTIVE:  ? ?CHIEF COMPLAINT / HPI:  ? ?Anxiety ?- Medications: abilify ?- previoulsy on zoloft ?- h/o suicide attempt, no current SI ?- Taking: good compliance ?- Counseling: no ?- Symptoms: crying more recently ?- Current stressors: coccyx fracture, knee pain ? ? ?  01/01/2022  ? 10:28 AM 12/06/2021  ?  2:53 PM 10/29/2021  ?  9:27 AM  ?Depression screen PHQ 2/9  ?Decreased Interest 0 0 0  ?Down, Depressed, Hopeless 0 0 0  ?PHQ - 2 Score 0 0 0  ?Altered sleeping  0 0  ?Tired, decreased energy  0 0  ?Change in appetite  0 0  ?Feeling bad or failure about yourself   0 0  ?Trouble concentrating  0 0  ?Moving slowly or fidgety/restless  0 0  ?Suicidal thoughts  0 0  ?PHQ-9 Score  0 0  ?Difficult doing work/chores  Not difficult at all   ? ? ? ? ?  12/27/2020  ?  2:29 PM 08/30/2020  ?  8:51 AM 10/27/2019  ?  8:25 AM 08/27/2019  ?  8:15 AM  ?GAD 7 : Generalized Anxiety Score  ?Nervous, Anxious, on Edge 1 0 0 1  ?Control/stop worrying 2 0 0 1  ?Worry too much - different things 2 0 0 1  ?Trouble relaxing 1 0 0 1  ?Restless 0 0 0 1  ?Easily annoyed or irritable 2 0 0 1  ?Afraid - awful might happen 1 0 1 1  ?Total GAD 7 Score 9 0 1 7  ?Anxiety Difficulty Somewhat difficult  Not difficult at all Somewhat difficult  ? ?Knee wound ?- fell and hit knee on gravel when taking trash out. Rock was lodged into anterior knee, pulled it out.  ?- noticed surrounding redness and warmth since Saturday. ?- using peroxide, iodine, triple abx cream. Had a few pills of amoxicillin left over from previous prescription, taking once daily since Saturday. Noticed redness improving since. ? ? ?OBJECTIVE:  ? ?BP 124/76   Pulse 93   Temp 97.7 ?F (36.5 ?C) (Oral)   Resp 18   Ht '5\' 7"'$  (1.702 m)   Wt 170 lb 12.8 oz (77.5 kg)   LMP  (LMP Unknown)   SpO2 98%   BMI 26.75 kg/m?   ?Gen: well appearing, in NAD ?Knee: 2 abrasions to anterior L knee, ~1cm in diameter with slough. Few mm area granulation tissue present to most anterior abrasion. No  tunneling, purulence, active bleeding, fluctuance, foreign body. Slight surrounding warmth, erythema. ?Ext: WWP, no edema ?Psych: pleasant, tearful at times. ? ? ?ASSESSMENT/PLAN:  ? ?Anxiety, generalized ?Worsened. Will trial abilify dose increase. F/u in 4 weeks.  ?  ?Cellulitis ?Rx keflex x5 days. No symptoms or findings to concern for MRSA infection. F/u if no better by next week.  ? ?Myles Gip, DO ?

## 2022-01-03 ENCOUNTER — Ambulatory Visit: Admission: RE | Admit: 2022-01-03 | Payer: Medicare Other | Source: Ambulatory Visit

## 2022-01-03 ENCOUNTER — Inpatient Hospital Stay: Payer: Medicaid Other

## 2022-01-08 ENCOUNTER — Inpatient Hospital Stay: Payer: Medicaid Other | Admitting: Oncology

## 2022-01-14 ENCOUNTER — Ambulatory Visit: Payer: Medicaid Other

## 2022-01-15 ENCOUNTER — Other Ambulatory Visit (HOSPITAL_COMMUNITY): Payer: Self-pay

## 2022-01-17 ENCOUNTER — Ambulatory Visit: Payer: Medicaid Other | Admitting: Oncology

## 2022-01-19 NOTE — Progress Notes (Signed)
?[place]  ?[phone] [phone] ? ?ID: Monique [name] OB: 09/28/1970  MR#: [id number]  YTK#:[id number] ? ?Patient Care Team: ?[name], MD as PCP - General (Family Medicine) ?Lloyd Huger, MD as Consulting Physician (Oncology) ?Lane Hacker, [facility] as Pharmacist (Pharmacist) ?Greg Cutter, LCSW as [facility] (Licensed [employer] representative) ? ? ?CHIEF COMPLAINT: CLL, iron deficiency. ? ?INTERVAL HISTORY: Patient returns to clinic today for repeat laboratory work, further evaluation, and discussion of her imaging results.  She has noticed some increased lymphadenopathy in her neck and axilla, but otherwise feels well.  She continues to tolerate Imbruvica without significant side effects.  She denies any fevers or night sweats.  She has a good appetite and denies weight loss.  She has no chest pain, shortness of breath, cough, or hemoptysis.  She denies any nausea, vomiting, constipation, or diarrhea.  She has no melena or hematochezia.  She has no urinary complaints.  Patient offers no further specific complaints today.   ? ?REVIEW OF SYSTEMS:   ?Review of Systems  ?Constitutional:  Negative for diaphoresis, fever, malaise/fatigue and weight loss.  ?HENT: Negative.  Negative for congestion.   ?Respiratory: Negative.  Negative for cough and shortness of breath.   ?Cardiovascular: Negative.  Negative for chest pain and leg swelling.  ?Gastrointestinal:  Negative for abdominal pain and constipation.  ?Genitourinary: Negative.  Negative for dysuria and frequency.  ?Musculoskeletal: Negative.  Negative for back pain, myalgias and neck pain.  ?Skin: Negative.   ?Neurological:  Positive for sensory change. Negative for tingling, focal weakness and weakness.  ?Psychiatric/Behavioral: Negative.  Negative for depression and suicidal ideas. The patient is not nervous/anxious.   ? ?As per HPI. Otherwise, a complete review of systems  is negative. ? ?PAST MEDICAL HISTORY: ?Past Medical History:  ?Diagnosis Date  ? Anxiety   ? Bursitis   ? leg pain  ? Carpal tunnel syndrome   ? Cervical dysplasia   ? hx LEEP over 18 years ago.   ? 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 Dr Grayland Ormond.  (in lymph nodes)  ? COPD (chronic obstructive pulmonary disease) ([place])   ? COVID-19 2021  ? Depression   ? Eating disorder   ? GERD (gastroesophageal reflux disease)   ? History of self-harm   ? Insomnia   ? Obsession   ? Tobacco abuse   ? Vitamin B12 deficiency (non anemic)   ? ? ?PAST SURGICAL HISTORY: ?Past Surgical History:  ?Procedure Laterality Date  ? BREAST BIOPSY Right 01/05/2018  ? US guided biopsy of 2 areas and 1 lymph node, MIXED INFLAMMATION AND GIANT CELL REACTION  ? CERVICAL BIOPSY  W/ LOOP ELECTRODE EXCISION    ? COLONOSCOPY WITH PROPOFOL N/A 04/21/2019  ? Procedure: COLONOSCOPY WITH PROPOFOL;  Surgeon: Virgel Manifold, MD;  Location: [facility] ENDOSCOPY;  Service: Gastroenterology;  Laterality: N/A;  ? OTHER SURGICAL HISTORY    ? scar tissue removed from vocal cords  ? TUBAL LIGATION    ? VAGINAL HYSTERECTOMY N/A 01/22/2021  ? Procedure: HYSTERECTOMY VAGINAL; BILATERAL SALPINGECTOMY;  Surgeon: Rubie Maid, MD;  Location: [facility] ORS;  Service: Gynecology;  Laterality: N/A;  ? vocal cord surgery  2005  ? ? ?FAMILY HISTORY: ?Family History  ?Problem Relation Age of Onset  ? Depression Mother   ? Cancer Mother   ?     thyroid  ? Alcohol abuse Father   ? Alcohol abuse Brother   ? Depression Brother   ?  Bipolar disorder Brother   ? Suicidality Brother   ? ADD / ADHD Son   ? Breast cancer Neg Hx   ? ? ?ADVANCED DIRECTIVES (Y/N):  N ? ?HEALTH MAINTENANCE: ?Social History  ? ?Tobacco Use  ? Smoking status: Every Day  ?  Packs/day: 1.00  ?  Years: 31.00  ?  Pack years: 31.00  ?  Types: Cigarettes  ?  Start date: 09/25/1986  ? Smokeless tobacco: Never  ?Vaping Use  ? Vaping Use: Former  ?Substance Use Topics  ? Alcohol use: Not Currently  ?  Alcohol/week: 0.0  standard drinks  ?  Comment: rarely  ? Drug use: Yes  ?  Frequency: 7.0 times per week  ?  Types: Marijuana  ? ? ? Colonoscopy: ? PAP: ? Bone density: ? Lipid panel: ? ?No Known Allergies ? ?Current Outpatient Medications  ?Medication Sig Dispense Refill  ? ARIPiprazole (ABILIFY) 5 MG tablet Take 1 tablet (5 mg total) by mouth daily. 90 tablet 0  ? Ascorbic Acid (VITAMIN C) 1000 MG tablet Take 1,000 mg by mouth daily as needed (immune health).    ? atorvastatin (LIPITOR) 10 MG tablet Take 1 tablet (10 mg total) by mouth in the morning. 90 tablet 3  ? Calcium Carbonate-Vit D-Min (GNP CALCIUM 1200) 1200-1000 MG-UNIT CHEW Chew 1,200 mg by mouth daily with breakfast. Take in combination with vitamin D and magnesium. (Patient taking differently: Chew 2 tablets by mouth in the morning, at noon, and at bedtime. Take in combination with vitamin D and magnesium.) 90 tablet 3  ? Crisaborole (EUCRISA) 2 % OINT Apply 1 application topically as directed. Qd to bid aa rash on hands and feet until clear, then prn flares 60 g 11  ? DULoxetine (CYMBALTA) 60 MG capsule Take 1 capsule (60 mg total) by mouth daily. 90 capsule 0  ? estrogens, conjugated, (PREMARIN) 0.625 MG tablet Take 1 tablet (0.625 mg total) by mouth daily. Take daily for 21 days then do not take for 7 days. 90 tablet 3  ? fluticasone-salmeterol (ADVAIR DISKUS) 250-50 MCG/ACT AEPB Inhale 1 puff into the lungs in the morning and at bedtime. 60 each 5  ? ibrutinib (IMBRUVICA) 420 MG tablet Take 1 tablet (420 mg) by mouth daily. 28 tablet 5  ? loratadine (CLARITIN) 10 MG tablet TAKE ONE TABLET BY MOUTH EVERY MORNING 90 tablet 0  ? Magnesium 500 MG CAPS Take 500 mg by mouth in the morning.    ? meloxicam (MOBIC) 15 MG tablet Take 1 tablet (15 mg total) by mouth daily. 90 tablet 1  ? omeprazole (PRILOSEC) 20 MG capsule Take 1 capsule (20 mg total) by mouth every morning. 90 capsule 3  ? Oxymetazoline HCl (RHOFADE) 1 % CREA Qam to face 30 g 11  ? pregabalin (LYRICA) 100 MG  capsule Take 100 mg by mouth 3 (three) times daily.    ? Ruxolitinib Phosphate (OPZELURA) 1.5 % CREA Apply 1 application topically as directed. Qd to bid aa rash on hands and feet until clear, then prn flares 60 g 11  ? vitamin B-12 (CYANOCOBALAMIN) 500 MCG tablet Take 250 mcg by mouth every morning.    ? benzonatate (TESSALON) 100 MG capsule Take 1 capsule (100 mg total) by mouth 2 (two) times daily as needed for cough. (Patient not taking: Reported on 01/23/2022) 20 capsule 0  ? tiZANidine (ZANAFLEX) 2 MG tablet TAKE ONE TABLET by MOUTH THREE times daily 30 tablet 0  ? ?No current facility-administered medications for  this visit.  ? ? ?OBJECTIVE: ?Vitals:  ? 01/23/22 1111  ?BP: (!) 94/50  ?Pulse: 84  ?Resp: 16  ?Temp: (!) 96 ?F (35.6 ?C)  ?SpO2: 98%  ?   Body mass index is 26.94 kg/m?Marland Kitchen    ECOG FS:0 - Asymptomatic ? ?General: Well-developed, well-nourished, no acute distress. ?Eyes: Pink conjunctiva, anicteric sclera. ?HEENT: Normocephalic, moist mucous membranes. ?Lungs: No audible wheezing or coughing. ?Heart: Regular rate and rhythm. ?Abdomen: Soft, nontender, no obvious distention. ?Musculoskeletal: No edema, cyanosis, or clubbing. ?Neuro: Alert, answering all questions appropriately. Cranial nerves grossly intact. ?Skin: No rashes or petechiae noted. ?Psych: Normal affect. ?Lymphatics: Minimally palpable cervical lymphadenopathy. ? ? ?LAB RESULTS: ? ?Lab Results  ?Component Value Date  ? NA 136 01/21/2022  ? K 4.1 01/21/2022  ? CL 104 01/21/2022  ? CO2 26 01/21/2022  ? GLUCOSE 83 01/21/2022  ? BUN 32 (H) 01/21/2022  ? CREATININE 0.87 01/21/2022  ? CALCIUM 8.8 (L) 01/21/2022  ? PROT 7.0 01/21/2022  ? ALBUMIN 3.9 01/21/2022  ? AST 16 01/21/2022  ? ALT 15 01/21/2022  ? ALKPHOS 65 01/21/2022  ? BILITOT 0.5 01/21/2022  ? GFRNONAA >60 01/21/2022  ? GFRAA 79 12/06/2020  ? ? ?Lab Results  ?Component Value Date  ? WBC 23.3 (H) 01/21/2022  ? NEUTROABS 4.8 01/21/2022  ? HGB 16.1 (H) 01/21/2022  ? HCT 47.1 (H) 01/21/2022   ? MCV 94.2 01/21/2022  ? PLT 139 (L) 01/21/2022  ? ? ? ?STUDIES: ?CT CHEST ABDOMEN PELVIS W CONTRAST ? ?Result Date: 01/22/2022 ?CLINICAL DATA:  History of CLL, follow-up. * Tracking Code: BO * EXAM: CT

## 2022-01-21 ENCOUNTER — Ambulatory Visit
Admission: RE | Admit: 2022-01-21 | Discharge: 2022-01-21 | Disposition: A | Discharge status: Home / Self Care | Payer: Medicare Other | Source: Ambulatory Visit | Attending: Oncology | Admitting: Oncology

## 2022-01-21 ENCOUNTER — Inpatient Hospital Stay: Payer: Medicare Other | Attending: Oncology

## 2022-01-21 ENCOUNTER — Telehealth: Payer: Self-pay | Admitting: Primary Care

## 2022-01-21 DIAGNOSIS — G629 Polyneuropathy, unspecified: Secondary | ICD-10-CM | POA: Insufficient documentation

## 2022-01-21 DIAGNOSIS — Z79899 Other long term (current) drug therapy: Secondary | ICD-10-CM | POA: Insufficient documentation

## 2022-01-21 DIAGNOSIS — D509 Iron deficiency anemia, unspecified: Secondary | ICD-10-CM | POA: Insufficient documentation

## 2022-01-21 DIAGNOSIS — F17210 Nicotine dependence, cigarettes, uncomplicated: Secondary | ICD-10-CM | POA: Insufficient documentation

## 2022-01-21 DIAGNOSIS — D751 Secondary polycythemia: Secondary | ICD-10-CM | POA: Insufficient documentation

## 2022-01-21 DIAGNOSIS — F32A Depression, unspecified: Secondary | ICD-10-CM | POA: Insufficient documentation

## 2022-01-21 DIAGNOSIS — C9110 Chronic lymphocytic leukemia of B-cell type not having achieved remission: Secondary | ICD-10-CM | POA: Diagnosis present

## 2022-01-21 DIAGNOSIS — F419 Anxiety disorder, unspecified: Secondary | ICD-10-CM | POA: Insufficient documentation

## 2022-01-21 LAB — COMPREHENSIVE METABOLIC PANEL
ALT: 15 U/L (ref 0–44)
AST: 16 U/L (ref 15–41)
Albumin: 3.9 g/dL (ref 3.5–5.0)
Alkaline Phosphatase: 65 U/L (ref 38–126)
Anion gap: 6 (ref 5–15)
BUN: 32 mg/dL — ABNORMAL HIGH (ref 6–20)
CO2: 26 mmol/L (ref 22–32)
Calcium: 8.8 mg/dL — ABNORMAL LOW (ref 8.9–10.3)
Chloride: 104 mmol/L (ref 98–111)
Creatinine, Ser: 0.87 mg/dL (ref 0.44–1.00)
GFR, Estimated: >60 mL/min (ref >60)
Glucose, Bld: 83 mg/dL (ref 70–99)
Potassium: 4.1 mmol/L (ref 3.5–5.1)
Sodium: 136 mmol/L (ref 135–145)
Total Bilirubin: 0.5 mg/dL (ref 0.3–1.2)
Total Protein: 7 g/dL (ref 6.5–8.1)

## 2022-01-21 LAB — IRON AND TIBC
Iron: 100 ug/dL (ref 28–170)
Saturation Ratios: 28 % (ref 10.4–31.8)
TIBC: 353 ug/dL (ref 250–450)
UIBC: 253 ug/dL

## 2022-01-21 LAB — CBC WITH DIFFERENTIAL/PLATELET
Abs Immature Granulocytes: 0.09 10*3/uL — ABNORMAL HIGH (ref 0.00–0.07)
Basophils Absolute: 0.1 10*3/uL (ref 0.0–0.1)
Basophils Relative: 1 %
Eosinophils Absolute: 0.3 10*3/uL (ref 0.0–0.5)
Eosinophils Relative: 1 %
HCT: 47.1 % — ABNORMAL HIGH (ref 36.0–46.0)
Hemoglobin: 16.1 g/dL — ABNORMAL HIGH (ref 12.0–15.0)
Immature Granulocytes: 0 %
Lymphocytes Relative: 55 %
Lymphs Abs: 12.7 10*3/uL — ABNORMAL HIGH (ref 0.7–4.0)
MCH: 32.2 pg (ref 26.0–34.0)
MCHC: 34.2 g/dL (ref 30.0–36.0)
MCV: 94.2 fL (ref 80.0–100.0)
Monocytes Absolute: 5.3 10*3/uL — ABNORMAL HIGH (ref 0.1–1.0)
Monocytes Relative: 23 %
Neutro Abs: 4.8 10*3/uL (ref 1.7–7.7)
Neutrophils Relative %: 20 %
Platelets: 139 10*3/uL — ABNORMAL LOW (ref 150–400)
RBC: 5 MIL/uL (ref 3.87–5.11)
RDW: 12.4 % (ref 11.5–15.5)
Smear Review: NORMAL
WBC: 23.3 10*3/uL — ABNORMAL HIGH (ref 4.0–10.5)
nRBC: 0 % (ref 0.0–0.2)

## 2022-01-21 LAB — FERRITIN: Ferritin: 40 ng/mL (ref 11–307)

## 2022-01-21 MED ORDER — IOHEXOL 300 MG/ML  SOLN
100.0000 mL | Freq: Once | INTRAMUSCULAR | Status: AC | PRN
  Administered 2022-01-21: 100 mL via INTRAVENOUS

## 2022-01-21 NOTE — Telephone Encounter (Signed)
Sleep study received from [facility] and faxed to [facility] at [facility] office for review. ? ?Beth, please advise. Thanks ?

## 2022-01-22 ENCOUNTER — Other Ambulatory Visit: Payer: Self-pay | Admitting: Nurse Practitioner

## 2022-01-22 ENCOUNTER — Telehealth: Payer: Self-pay | Admitting: *Deleted

## 2022-01-22 ENCOUNTER — Other Ambulatory Visit: Payer: Self-pay | Admitting: Family Medicine

## 2022-01-22 DIAGNOSIS — G8929 Other chronic pain: Secondary | ICD-10-CM

## 2022-01-22 DIAGNOSIS — M7918 Myalgia, other site: Secondary | ICD-10-CM

## 2022-01-22 NOTE — Telephone Encounter (Signed)
Patient called with questions about her appointments something about CT and sleep apnea and this being mixed up. Please listen to the phone message regarding this ?

## 2022-01-23 ENCOUNTER — Inpatient Hospital Stay (HOSPITAL_BASED_OUTPATIENT_CLINIC_OR_DEPARTMENT_OTHER): Payer: Medicare Other | Admitting: Oncology

## 2022-01-23 ENCOUNTER — Telehealth: Payer: Self-pay | Admitting: Pharmacist

## 2022-01-23 ENCOUNTER — Other Ambulatory Visit (HOSPITAL_COMMUNITY): Payer: Self-pay

## 2022-01-23 ENCOUNTER — Telehealth: Payer: Self-pay | Admitting: Pharmacy Technician

## 2022-01-23 ENCOUNTER — Encounter: Payer: Self-pay | Admitting: Oncology

## 2022-01-23 VITALS — BP 94/50 | HR 84 | Temp 96.0°F | Resp 16 | Ht 67.0 in | Wt 172.0 lb

## 2022-01-23 DIAGNOSIS — C9110 Chronic lymphocytic leukemia of B-cell type not having achieved remission: Secondary | ICD-10-CM

## 2022-01-23 NOTE — Telephone Encounter (Signed)
Oral Oncology Pharmacist Encounter ? ?Received new prescription for Venclexta (venetoclax) for the treatment of progressive CLL, planned duration until disease progression or unacceptable drug toxicity. ? ?CBC/CMP from 01/21/22 assessed, ALC elevated. PAtient will be monitored for . Prescription dose and frequency assessed.  ? ?Current medication list in Epic reviewed, no DDIs with venetoclax identified. ? ?Evaluated chart and no patient barriers to medication adherence identified.  ? ?Prescription has been e-scribed to the [facility] for benefits analysis and approval. ? ?Oral Oncology Clinic will continue to follow for insurance authorization, copayment issues, initial counseling and start date. ? ?Patient agreed to treatment on 01/23/22 per MD documentation. ? ?[name], PharmD, BCPS, BCOP, CPP ?Hematology/Oncology Clinical Pharmacist Practitioner ?[place]/[facility]/AP Oral Chemotherapy Navigation Clinic ?[phone] ? ?01/23/2022 1:57 PM ? ?

## 2022-01-23 NOTE — Telephone Encounter (Signed)
Requested medication (s) are due for refill today: yes ? ?Requested medication (s) are on the active medication list: yes ? ?Last refill:  12/06/21 ? ?Future visit scheduled: yes ? ?Notes to clinic:  Unable to refill per protocol, cannot delegate. ? ? ? ?  ?Requested Prescriptions  ?Pending Prescriptions Disp Refills  ? tiZANidine (ZANAFLEX) 2 MG tablet [Pharmacy Med Name: tizanidine 2 mg tablet] 30 tablet 0  ?  Sig: TAKE ONE TABLET by MOUTH THREE times daily  ?  ? Not Delegated - Cardiovascular:  Alpha-2 Agonists - tizanidine Failed - 01/22/2022  1:33 PM  ?  ?  Failed - This refill cannot be delegated  ?  ?  Passed - Valid encounter within last 6 months  ?  Recent Outpatient Visits   ? ?      ? 3 weeks ago Anxiety, generalized  ? [place], DO  ? 1 month ago Closed fracture of coccyx with routine healing, subsequent encounter  ? [facility] Bo Merino, FNP  ? 2 months ago Well adult exam  ? [facility] [name], MD  ? 5 months ago COPD exacerbation [facility])  ? [place], DO  ? 7 months ago No-show for appointment  ? [place], DO  ? ?  ?  ?Future Appointments   ? ?        ? In 1 week Grayland Ormond, Kathlene November, MD [facility] at [place]  ? In 1 week [name], RPH-CPP [facility] at -Medical Oncology  ? In 1 month Sowles, Drue Stager, MD [facility], [place]  ? In 3 months [name], MD [place]  ? In 5 months [name], MD [place]  ? ?  ? ? ?  ?  ?  ? ? ?

## 2022-01-23 NOTE — Telephone Encounter (Signed)
Patient Advocate Encounter ? ?Pharmacist, community for [employer] completed. ? ?The current 30 day co-pay is, $0.  ? ?The patient is insured through [employer]. ? ?Lady Deutscher, CPhT-Adv ?Pharmacy Patient Advocate Specialist ?[place] Patient Advocate Team ?Direct Number: [phone]  Fax: [phone] ? ? ? ? ? ? ? ? ? ? ? ? ?

## 2022-01-24 ENCOUNTER — Encounter: Payer: Self-pay | Admitting: Oncology

## 2022-01-24 NOTE — Telephone Encounter (Signed)
I had Monique [name] check my folder and it is not in there, can we have it re-sent

## 2022-01-24 NOTE — Telephone Encounter (Signed)
Lm for Monique [name] with [facility] to request sleep study.  ?

## 2022-01-24 NOTE — Telephone Encounter (Signed)
Beth, have you received sleep study? ?

## 2022-01-25 NOTE — Telephone Encounter (Signed)
Sleep study on 11/06/21 showed mostly hypopneas (shallow breathing), no evidence of obstructive sleep apnea. Average O2 was 94%. Time spend below oxygen saturation of 90% was 0 mins. No indication for treatment for sleep disordered breathing. Avoid sleeping in supine position. Follow up with PCP regarding further work up if needed for daytime sleepiness.

## 2022-01-25 NOTE — Telephone Encounter (Signed)
Lm for patient.  

## 2022-01-25 NOTE — Telephone Encounter (Signed)
Sleep study received and re faxed to [facility] at [facility] office.  ?

## 2022-01-27 NOTE — Progress Notes (Signed)
[place]  Telephone:([phone] Fax:(336) [phone]  ID: Monique [name] OB: 1970-09-27  MR#: [id number]  GGY#:[id number]  Patient Care Team: [name], MD as PCP - General (Family Medicine) Lloyd Huger, MD as Consulting Physician (Oncology) Lane Hacker, [facility] as Pharmacist (Pharmacist) Greg Cutter, LCSW as [place] [employer] (Licensed Clinical Social Worker)  I connected with Monique [name] on 02/01/22 at  2:15 PM EDT by video enabled telemedicine visit and verified that I am speaking with the correct person using two identifiers.   I discussed the limitations, risks, security and privacy concerns of performing an evaluation and management service by telemedicine and the availability of in-person appointments. I also discussed with the patient that there may be a patient responsible charge related to this service. The patient expressed understanding and agreed to proceed.   Other persons participating in the visit and their role in the encounter: Patient, MD.  Patient's location: Home. Provider's location: Clinic.  CHIEF COMPLAINT: CLL, iron deficiency.  INTERVAL HISTORY: Patient agreed to video assisted telemedicine visit for further evaluation and treatment planning. She denies any fevers or night sweats.  She has a good appetite and denies weight loss.  She has no chest pain, shortness of breath, cough, or hemoptysis.  She denies any nausea, vomiting, constipation, or diarrhea.  She has no melena or hematochezia.  She has no urinary complaints.  Patient offers no further specific complaints today.  REVIEW OF SYSTEMS:   Review of Systems  Constitutional:  Negative for diaphoresis, fever, malaise/fatigue and weight loss.  HENT: Negative.  Negative for congestion.   Respiratory: Negative.  Negative for cough and shortness of breath.   Cardiovascular: Negative.  Negative for chest pain and leg swelling.   Gastrointestinal:  Negative for abdominal pain and constipation.  Genitourinary: Negative.  Negative for dysuria and frequency.  Musculoskeletal: Negative.  Negative for back pain, myalgias and neck pain.  Skin: Negative.   Neurological:  Positive for sensory change. Negative for tingling, focal weakness and weakness.  Psychiatric/Behavioral: Negative.  Negative for depression and suicidal ideas. The patient is not nervous/anxious.    As per HPI. Otherwise, a complete review of systems is negative.  PAST MEDICAL HISTORY: Past Medical History:  Diagnosis Date   Anxiety    Bursitis    leg pain   Carpal tunnel syndrome    Cervical dysplasia    hx LEEP over 18 years ago.    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  (in lymph nodes)   COPD (chronic obstructive pulmonary disease) ([place])    COVID-19 2021   Depression    Eating disorder    GERD (gastroesophageal reflux disease)    History of self-harm    Insomnia    Obsession    Tobacco abuse    Vitamin B12 deficiency (non anemic)     PAST SURGICAL HISTORY: Past Surgical History:  Procedure Laterality Date   BREAST BIOPSY Right 01/05/2018   US guided biopsy of 2 areas and 1 lymph node, MIXED INFLAMMATION AND GIANT CELL REACTION   CERVICAL BIOPSY  W/ LOOP ELECTRODE EXCISION     COLONOSCOPY WITH PROPOFOL N/A 04/21/2019   Procedure: COLONOSCOPY WITH PROPOFOL;  Surgeon: Virgel Manifold, MD;  Location: [facility] ENDOSCOPY;  Service: Gastroenterology;  Laterality: N/A;   OTHER SURGICAL HISTORY     scar tissue removed from vocal cords   TUBAL LIGATION     VAGINAL HYSTERECTOMY N/A 01/22/2021   Procedure:  HYSTERECTOMY VAGINAL; BILATERAL SALPINGECTOMY;  Surgeon: Rubie Maid, MD;  Location: [facility] ORS;  Service: Gynecology;  Laterality: N/A;   vocal cord surgery  2005    FAMILY HISTORY: Family History  Problem Relation Age of Onset   Depression Mother    Cancer Mother        thyroid   Alcohol abuse Father    Alcohol  abuse Brother    Depression Brother    Bipolar disorder Brother    Suicidality Brother    ADD / ADHD Son    Breast cancer Neg Hx     ADVANCED DIRECTIVES (Y/N):  N  HEALTH MAINTENANCE: Social History   Tobacco Use   Smoking status: Every Day    Packs/day: 1.00    Years: 31.00    Pack years: 31.00    Types: Cigarettes    Start date: 09/25/1986   Smokeless tobacco: Never  Vaping Use   Vaping Use: Former  Substance Use Topics   Alcohol use: Not Currently    Alcohol/week: 0.0 standard drinks    Comment: rarely   Drug use: Yes    Frequency: 7.0 times per week    Types: Marijuana     Colonoscopy:  PAP:  Bone density:  Lipid panel:  No Known Allergies  Current Outpatient Medications  Medication Sig Dispense Refill   ARIPiprazole (ABILIFY) 5 MG tablet Take 1 tablet (5 mg total) by mouth daily. 90 tablet 0   Ascorbic Acid (VITAMIN C) 1000 MG tablet Take 1,000 mg by mouth daily as needed (immune health).     atorvastatin (LIPITOR) 10 MG tablet Take 1 tablet (10 mg total) by mouth in the morning. 90 tablet 3   benzonatate (TESSALON) 100 MG capsule Take 1 capsule (100 mg total) by mouth 2 (two) times daily as needed for cough. 20 capsule 0   Crisaborole (EUCRISA) 2 % OINT Apply 1 application topically as directed. Qd to bid aa rash on hands and feet until clear, then prn flares 60 g 11   DULoxetine (CYMBALTA) 60 MG capsule Take 1 capsule (60 mg total) by mouth daily. 90 capsule 0   estrogens, conjugated, (PREMARIN) 0.625 MG tablet Take 1 tablet (0.625 mg total) by mouth daily. Take daily for 21 days then do not take for 7 days. 90 tablet 3   fluticasone-salmeterol (ADVAIR DISKUS) 250-50 MCG/ACT AEPB Inhale 1 puff into the lungs in the morning and at bedtime. 60 each 5   loratadine (CLARITIN) 10 MG tablet TAKE ONE TABLET BY MOUTH EVERY MORNING 90 tablet 0   Magnesium 500 MG CAPS Take 500 mg by mouth in the morning.     meloxicam (MOBIC) 15 MG tablet Take 1 tablet (15 mg total) by  mouth daily. 90 tablet 1   omeprazole (PRILOSEC) 20 MG capsule Take 1 capsule (20 mg total) by mouth every morning. 90 capsule 3   Oxymetazoline HCl (RHOFADE) 1 % CREA Qam to face 30 g 11   pregabalin (LYRICA) 100 MG capsule Take 100 mg by mouth 3 (three) times daily.     Ruxolitinib Phosphate (OPZELURA) 1.5 % CREA Apply 1 application topically as directed. Qd to bid aa rash on hands and feet until clear, then prn flares 60 g 11   tiZANidine (ZANAFLEX) 2 MG tablet TAKE ONE TABLET by MOUTH THREE times daily 30 tablet 0   venetoclax (VENCLEXTA) 10 & 50 & 100 MG Starter Pack Take by mouth daily. Take 20 mg for 7 days, then 50 mg daily x  7d, then 100 mg daily x 7d, then 200 mg daily x 7d. Take with food & water. 42 tablet 0   vitamin B-12 (CYANOCOBALAMIN) 500 MCG tablet Take 250 mcg by mouth every morning.     Calcium Carbonate-Vit D-Min (GNP CALCIUM 1200) 1200-1000 MG-UNIT CHEW Chew 1,200 mg by mouth daily with breakfast. Take in combination with vitamin D and magnesium. (Patient taking differently: Chew 2 tablets by mouth in the morning, at noon, and at bedtime. Take in combination with vitamin D and magnesium.) 90 tablet 3   ondansetron (ZOFRAN) 8 MG tablet Take 1 tablet (8 mg total) by mouth every 8 (eight) hours as needed for nausea or vomiting. 20 tablet 0   No current facility-administered medications for this visit.    OBJECTIVE: There were no vitals filed for this visit.    There is no height or weight on file to calculate BMI.    ECOG FS:0 - Asymptomatic  General: Well-developed, well-nourished, no acute distress. HEENT: Normocephalic. Neuro: Alert, answering all questions appropriately. Cranial nerves grossly intact. Psych: Normal affect.   LAB RESULTS:  Lab Results  Component Value Date   NA 136 01/21/2022   K 4.1 01/21/2022   CL 104 01/21/2022   CO2 26 01/21/2022   GLUCOSE 83 01/21/2022   BUN 32 (H) 01/21/2022   CREATININE 0.87 01/21/2022   CALCIUM 8.8 (L) 01/21/2022    PROT 7.0 01/21/2022   ALBUMIN 3.9 01/21/2022   AST 16 01/21/2022   ALT 15 01/21/2022   ALKPHOS 65 01/21/2022   BILITOT 0.5 01/21/2022   GFRNONAA >60 01/21/2022   GFRAA 79 12/06/2020    Lab Results  Component Value Date   WBC 23.3 (H) 01/21/2022   NEUTROABS 4.8 01/21/2022   HGB 16.1 (H) 01/21/2022   HCT 47.1 (H) 01/21/2022   MCV 94.2 01/21/2022   PLT 139 (L) 01/21/2022     STUDIES: CT CHEST ABDOMEN PELVIS W CONTRAST  Result Date: 01/22/2022 CLINICAL DATA:  History of CLL, follow-up. * Tracking Code: BO * EXAM: CT CHEST, ABDOMEN, AND PELVIS WITH CONTRAST TECHNIQUE: Multidetector CT imaging of the chest, abdomen and pelvis was performed following the standard protocol during bolus administration of intravenous contrast. RADIATION DOSE REDUCTION: This exam was performed according to the departmental dose-optimization program which includes automated exposure control, adjustment of the mA and/or kV according to patient size and/or use of iterative reconstruction technique. CONTRAST:  147m OMNIPAQUE IOHEXOL 300 MG/ML  SOLN COMPARISON:  Multiple priors including most recent CT May 22, 2021 FINDINGS: CT CHEST FINDINGS Cardiovascular: Aortic atherosclerosis without aneurysmal dilation. No central pulmonary embolus on this nondedicated study. Normal size heart. No significant pericardial effusion/thickening. Mediastinum/Nodes: Interval progression of the bilateral axillary and subpectoral adenopathy with the indexed left axillary lymph nodes now measuring 22 x 11 mm on image 21/2 previously 18 x 10 mm. A right subpectoral lymph node measures 17 x 11 mm on image 16/2 previously 13 x 7 mm. Prominent mediastinal lymph nodes are similar to prior with a precarinal lymph node measuring 17 x 9 mm on image 24/2, unchanged. Esophagus is grossly unremarkable. Lungs/Pleura: Mild biapical pleuroparenchymal scarring. Mild apical predominant centrilobular emphysema. No new suspicious pulmonary nodules or  masses. Previously index pulmonary nodules are as follows. -right middle lobe pulmonary nodule adjacent to the is now centrally cavitary and measures 4 mm on image 70/7 unchanged in size from prior. -unchanged size of the 5 mm right lower middle lobe pulmonary nodule image 73/7 in the 4 mm right  middle lobe pulmonary nodule on image 79/7. No pleural effusion.  No pneumothorax. Musculoskeletal: No aggressive lytic or blastic lesion of bone. CT ABDOMEN PELVIS FINDINGS Hepatobiliary: No suspicious hepatic lesion. Gallbladder is unremarkable. No biliary ductal dilation. Pancreas: No pancreatic ductal dilation or evidence of acute inflammation. Spleen: No splenomegaly or focal splenic lesion. Adrenals/Urinary Tract: Bilateral adrenal glands appear normal. No hydronephrosis. Kidneys demonstrate symmetric enhancement and excretion of contrast material. Stomach/Bowel: Radiopaque enteric contrast material traverses the hepatic flexure. Stomach is unremarkable for degree of distension. No pathologic dilation of small or large bowel. The appendix and terminal ileum appear normal. Moderate volume of formed stool throughout the colon suggestive of constipation. No evidence of acute bowel inflammation. Vascular/Lymphatic: Interval progression in the abdominopelvic adenopathy. Previously index lymph nodes are as follows -left periaortic lymph node measures 3.4 x 1.5 cm on image 84/2 previously 2.7 x 1.4 cm. -left external iliac chain lymph node measures 2.5 x 1.0 cm on image 118/2 previously 1.8 x 0.8 cm -right pelvic sidewall lymph node now measures 3.0 x 1.2 cm on image 114/2 previously 2.3 x 0.9 cm normal caliber abdominal aorta . Reproductive: Status post hysterectomy. No adnexal masses. Other: No significant abdominopelvic free fluid. Musculoskeletal: No aggressive lytic or blastic lesion of bone. Hemi-sacralization of the right L5 vertebral body. No acute osseous abnormality. IMPRESSION: 1. Interval progression of adenopathy  above and below the diaphragm. 2. No splenomegaly and no convincing evidence of solid organ or osseous lymphomatous involvement. 3. Unchanged small right-sided pulmonary nodules. 4. Moderate volume of formed stool throughout the colon suggestive of constipation. 5. Aortic Atherosclerosis (ICD10-I70.0) and Emphysema (ICD10-J43.9). Electronically Signed   By: Dahlia Bailiff M.D.   On: 01/22/2022 14:59   SLEEP STUDY DOCUMENTS  Result Date: 01/22/2022 Ordered by an unspecified provider.  SLEEP STUDY DOCUMENTS  Result Date: 01/22/2022 Ordered by an unspecified provider.   ASSESSMENT: CLL,  iron deficiency.  PLAN:    1. CLL: Confirmed by peripheral blood flow cytometry.  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 Patient completed cycle 3 of Rituxan plus Treanda on March 26, 2018, but was noted to have progressive disease.  She was then initiated on 480 mg Imbruvica in November 2019.  Patient states she is compliant with her medication.  Her white blood cell count is trending up.  CT scan on Jan 22, 2022 reviewed independently and reported as above reveals progression of disease.  Patient will now discontinue Imbruvica and plan to start venetoclax in combination with Rituxan.  Patient will initiate venetoclax at 20 mg daily starting on Feb 04, 2022.  She will then taper up her dose to a total of 400 mg daily over the following 4 weeks.  Plan to continue venetoclax for a total of 2 years.  Initially, will get weekly labs.  Patient will then return to clinic on March 12, 2022 to initiate cycle 1 of 6 of Rituxan.  Patient will have video visit in 1 week for further treatment planning.  Appreciate clinical pharmacy input. 2.  Leukocytosis: Secondary to CLL.  Change treatment to Rituxan and venetoclax as above. 3.  Trigeminal neuralgia: Patient does not complain of this today.  Appears to be unrelated to CLL or Imbruvica.  MRI of the brain  on Jan 25, 2019 was unremarkable.   4.  Depression/anxiety: Chronic.  Continue follow-up and treatment per primary care. 5.  Neuropathic pain/peripheral neuropathy: Chronic and unchanged.  Patient reports she is now on disability.  Continue current follow-up and treatment as per neurology. 6.  Iron deficiency: Resolved.  Patient received 1 dose of IV Feraheme on October 21, 2019.  Patient now has a slight polycythemia.   I provided 30 minutes of face-to-face video visit time during this encounter which included chart review, counseling, and coordination of care as documented above.   Patient expressed understanding and was in agreement with this plan. She also understands that She can call clinic at any time with any questions, concerns, or complaints.    Lloyd Huger, MD   02/01/2022 12:20 PM

## 2022-01-28 NOTE — Telephone Encounter (Signed)
Patient is aware of results and voiced her understanding.  Nothing further needed.   

## 2022-01-29 ENCOUNTER — Other Ambulatory Visit (HOSPITAL_COMMUNITY): Payer: Self-pay

## 2022-01-29 MED ORDER — VENETOCLAX 10 & 50 & 100 MG PO TBPK
ORAL_TABLET | ORAL | 0 refills | Status: DC
  Filled 2022-01-29: qty 42, fill #0
  Filled 2022-01-29: qty 42, 28d supply, fill #0

## 2022-01-30 ENCOUNTER — Other Ambulatory Visit (HOSPITAL_COMMUNITY): Payer: Self-pay

## 2022-01-31 ENCOUNTER — Inpatient Hospital Stay: Payer: Medicare Other | Admitting: Pharmacist

## 2022-01-31 ENCOUNTER — Encounter: Payer: Self-pay | Admitting: Oncology

## 2022-01-31 ENCOUNTER — Inpatient Hospital Stay (HOSPITAL_BASED_OUTPATIENT_CLINIC_OR_DEPARTMENT_OTHER): Payer: Medicare Other | Admitting: Oncology

## 2022-01-31 DIAGNOSIS — C9110 Chronic lymphocytic leukemia of B-cell type not having achieved remission: Secondary | ICD-10-CM

## 2022-01-31 MED ORDER — ONDANSETRON HCL 8 MG PO TABS: 8.0000 mg | ORAL_TABLET | Freq: Three times a day (TID) | ORAL | 0 refills | Status: DC | PRN

## 2022-01-31 NOTE — Progress Notes (Signed)
[facility] [place]  Telephone:([phone] Fax:([phone]    I connected withNAME@ on 01/31/22 at  2:30 PM EDT by telephone and verified that I am speaking with the correct person using two identifiers.  Name of the patient: Monique [name]  [id number]  [birth year]   Location: Patient: home Provider: clinic office   I discussed the limitations, risks, security and privacy concerns of performing an evaluation and management service by telephone and the availability of in person appointments. I also discussed with the patient that there may be a patient responsible charge related to this service. The patient expressed understanding and agreed to proceed.  HPI: Patient is a 51 y.o. female with progressive CLL, previously treated with ibrutinib. She will now be transitioned to treatment with Venclexta (venetoclax) and rtiuxumab. Planned start 02/04/22  Reason for Consult:  Venetoclax oral chemotherapy education.   PAST MEDICAL HISTORY: Past Medical History:  Diagnosis Date   Anxiety    Bursitis    leg pain   Carpal tunnel syndrome    Cervical dysplasia    hx LEEP over 18 years ago.    Chronic lymphocytic leukemia ([place]) 2018   Dr Grayland Ormond.  (in lymph nodes)   COPD (chronic obstructive pulmonary disease) ([place])    COVID-19 2021   Depression    Eating disorder    GERD (gastroesophageal reflux disease)    History of self-harm    Insomnia    Obsession    Tobacco abuse    Vitamin B12 deficiency (non anemic)     HEMATOLOGY/ONCOLOGY HISTORY:  Oncology History Overview Note  Patient with recent CLL diagnosis by flow cytometry.  Previously not being treated in observation.   S/p 4 cycles of weekly Rituxan completing on 02/20/2017 scans from January 2018 revealed minimal progression.   Developed right breast mass in March 2019.  Had diagnostic mammogram performed on 12/26/2017 showed suspicious right breast mass.  Biopsy revealed  granulomatous mastitis.  She was treated with doxycycline 100 mg twice daily for 7 days.   Developed worsening adenopathy.  CT chest/abdomen/pelvis and soft tissue neck from May 2019 revealed progressive bulky adenopathy in the chest, abdomen and pelvis compatible with worsening disease.   [name] recommended beginning treatment with Rituxan and Treanda.  Received first cycle last week.  S/p 6 cycles of Rituxan and Treanda.  Tolerating well.  Last dose given 03/25/18.    CLL (chronic lymphocytic leukemia) ([place])  11/24/2016 Initial Diagnosis   CLL (chronic lymphocytic leukemia) ([place])    01/21/2018 - 03/26/2018 Chemotherapy   The patient had palonosetron (ALOXI) injection 0.25 mg, 0.25 mg, Intravenous,  Once, 4 of 5 cycles Administration: 0.25 mg (01/28/2018), 0.25 mg (02/25/2018), 0.25 mg (03/25/2018) riTUXimab (RITUXAN) 700 mg in sodium chloride 0.9 % 250 mL (2.1875 mg/mL) infusion, 375 mg/m2 = 700 mg, Intravenous,  Once, 4 of 5 cycles Administration: 700 mg (01/28/2018) bendamustine (BENDEKA) 150 mg in sodium chloride 0.9 % 50 mL (2.6786 mg/mL) chemo infusion, 90 mg/m2 = 150 mg, Intravenous,  Once, 4 of 5 cycles Administration: 150 mg (01/28/2018), 150 mg (01/29/2018), 150 mg (02/25/2018), 150 mg (02/26/2018), 150 mg (03/25/2018), 150 mg (03/26/2018)   for chemotherapy treatment.       ALLERGIES:  has No Known Allergies.  MEDICATIONS:  Current Outpatient Medications  Medication Sig Dispense Refill   ondansetron (ZOFRAN) 8 MG tablet Take 1 tablet (8 mg total) by mouth every 8 (eight) hours as needed for nausea or vomiting. 20 tablet 0  ARIPiprazole (ABILIFY) 5 MG tablet Take 1 tablet (5 mg total) by mouth daily. 90 tablet 0   Ascorbic Acid (VITAMIN C) 1000 MG tablet Take 1,000 mg by mouth daily as needed (immune health).     atorvastatin (LIPITOR) 10 MG tablet Take 1 tablet (10 mg total) by mouth in the morning. 90 tablet 3   benzonatate (TESSALON) 100 MG capsule Take 1 capsule (100 mg  total) by mouth 2 (two) times daily as needed for cough. 20 capsule 0   Calcium Carbonate-Vit D-Min (GNP CALCIUM 1200) 1200-1000 MG-UNIT CHEW Chew 1,200 mg by mouth daily with breakfast. Take in combination with vitamin D and magnesium. (Patient taking differently: Chew 2 tablets by mouth in the morning, at noon, and at bedtime. Take in combination with vitamin D and magnesium.) 90 tablet 3   Crisaborole (EUCRISA) 2 % OINT Apply 1 application topically as directed. Qd to bid aa rash on hands and feet until clear, then prn flares 60 g 11   DULoxetine (CYMBALTA) 60 MG capsule Take 1 capsule (60 mg total) by mouth daily. 90 capsule 0   estrogens, conjugated, (PREMARIN) 0.625 MG tablet Take 1 tablet (0.625 mg total) by mouth daily. Take daily for 21 days then do not take for 7 days. 90 tablet 3   fluticasone-salmeterol (ADVAIR DISKUS) 250-50 MCG/ACT AEPB Inhale 1 puff into the lungs in the morning and at bedtime. 60 each 5   loratadine (CLARITIN) 10 MG tablet TAKE ONE TABLET BY MOUTH EVERY MORNING 90 tablet 0   Magnesium 500 MG CAPS Take 500 mg by mouth in the morning.     meloxicam (MOBIC) 15 MG tablet Take 1 tablet (15 mg total) by mouth daily. 90 tablet 1   omeprazole (PRILOSEC) 20 MG capsule Take 1 capsule (20 mg total) by mouth every morning. 90 capsule 3   Oxymetazoline HCl (RHOFADE) 1 % CREA Qam to face 30 g 11   pregabalin (LYRICA) 100 MG capsule Take 100 mg by mouth 3 (three) times daily.     Ruxolitinib Phosphate (OPZELURA) 1.5 % CREA Apply 1 application topically as directed. Qd to bid aa rash on hands and feet until clear, then prn flares 60 g 11   tiZANidine (ZANAFLEX) 2 MG tablet TAKE ONE TABLET by MOUTH THREE times daily 30 tablet 0   venetoclax (VENCLEXTA) 10 & 50 & 100 MG Starter Pack Take by mouth daily. Take 20 mg for 7 days, then 50 mg daily x 7d, then 100 mg daily x 7d, then 200 mg daily x 7d. Take with food & water. 42 tablet 0   vitamin B-12 (CYANOCOBALAMIN) 500 MCG tablet Take 250  mcg by mouth every morning.     No current facility-administered medications for this visit.    VITAL SIGNS: LMP  (LMP Unknown)  There were no vitals filed for this visit.  Estimated body mass index is 26.94 kg/m as calculated from the following:   Height as of 01/23/22: 5' 7"  (1.702 m).   Weight as of 01/23/22: 78 kg (172 lb).  LABS: CBC:    Component Value Date/Time   WBC 23.3 (H) 01/21/2022 1319   HGB 16.1 (H) 01/21/2022 1319   HCT 47.1 (H) 01/21/2022 1319   PLT 139 (L) 01/21/2022 1319   MCV 94.2 01/21/2022 1319   NEUTROABS 4.8 01/21/2022 1319   LYMPHSABS 12.7 (H) 01/21/2022 1319   MONOABS 5.3 (H) 01/21/2022 1319   EOSABS 0.3 01/21/2022 1319   BASOSABS 0.1 01/21/2022 1319  Comprehensive Metabolic Panel:    Component Value Date/Time   NA 136 01/21/2022 1319   NA 138 05/13/2018 1413   K 4.1 01/21/2022 1319   CL 104 01/21/2022 1319   CO2 26 01/21/2022 1319   BUN 32 (H) 01/21/2022 1319   BUN 15 05/13/2018 1413   CREATININE 0.87 01/21/2022 1319   CREATININE 0.96 12/06/2020 1646   GLUCOSE 83 01/21/2022 1319   CALCIUM 8.8 (L) 01/21/2022 1319   AST 16 01/21/2022 1319   ALT 15 01/21/2022 1319   ALKPHOS 65 01/21/2022 1319   BILITOT 0.5 01/21/2022 1319   BILITOT 0.4 05/13/2018 1413   PROT 7.0 01/21/2022 1319   PROT 6.3 05/13/2018 1413   ALBUMIN 3.9 01/21/2022 1319   ALBUMIN 4.1 05/13/2018 1413    On phone during today's visit: patient only  Start plan: She will start her venetoclax on 02/04/22. Her rituximab will begin once her venetoclax esclation is complete and she has been on 452m for one week.   Patient Education I spoke with patient for overview of new oral chemotherapy medication: venetoclax   Administration: Counseled patient on administration, dosing, side effects, monitoring, drug-food interactions, safe handling, storage, and disposal. Patient will take venetoclax by mouth daily. Take 20 mg for 7 days, then 50 mg daily x 7d, then 100 mg daily x 7d, then  200 mg daily x 7d. Take with food & water.  **Encouraged Ms. Aquilino to stay hydrated to help in prevention of TLS  Side Effects: Side effects include but not limited to: diarrhea, nausea, TLS, decreased wbc/hgb/plt.    Drug-drug Interactions (DDI): No current DDIs with venetoclax  Adherence: After discussion with patient no patient barriers to medication adherence identified.  Reviewed with patient importance of keeping a medication schedule and plan for any missed doses.  Ms.Koller voiced understanding and appreciation. All questions answered. Medication handout provided.  [facility]. Patient knows to call the office with questions or concerns. [facility] will continue to follow.  Patient expressed understanding and was in agreement with this plan. She also understands that She can call clinic at any time with any questions, concerns, or complaints.   Medication Access Issues: No issues venetoclax starter kit was delivered today  Follow-up plan: RTC weekly of labs starting next week  Thank you for allowing me to participate in the care of this patient.   Time Total: 15 mins  Visit consisted of counseling and education on dealing with issues of symptom management in the setting of serious and potentially life-threatening illness.Greater than 50%  of this time was spent counseling and coordinating care related to the above assessment and plan.  ADarl Pikes PharmD, BCPS, BCOP, CPP Hematology/Oncology Clinical Pharmacist Practitioner [place]/DB/AP [phone] 01/31/2022 3:07 PM

## 2022-02-01 ENCOUNTER — Encounter: Payer: Self-pay | Admitting: Oncology

## 2022-02-01 NOTE — Progress Notes (Signed)
DISCONTINUE OFF PATHWAY REGIMEN - Lymphoma and CLL   [id number]:Bendamustine 90 mg/m2 D1, 2 + Rituximab (IV/Subcut) D1 q28 Days:   A cycle is every 28 days:     Bendamustine      Rituximab      Rituximab and hyaluronidase human   **Always confirm dose/schedule in your pharmacy ordering system**  REASON: Disease Progression PRIOR TREATMENT: Off Pathway: Bendamustine 90 mg/m2 D1, 2 + Rituximab (IV/Subcut) D1 q28 Days TREATMENT RESPONSE: Progressive Disease (PD)  START ON PATHWAY REGIMEN - Lymphoma and CLL     Ramp-up cycle = 35 days:     Venetoclax      Venetoclax      Venetoclax      Venetoclax      Venetoclax    Cycle 1 through 24 = every 28 days:     Venetoclax      Rituximab-xxxx      Rituximab-xxxx   **Always confirm dose/schedule in your pharmacy ordering system**  Patient Characteristics: Chronic Lymphocytic Leukemia (CLL), Treatment Indicated, Third Line Disease Type: Chronic Lymphocytic Leukemia (CLL) Disease Type: Not Applicable Disease Type: Not Applicable Treatment Indicated<= Treatment Indicated Line of Therapy: Third Line Intent of Therapy: Non-Curative / Palliative Intent, Discussed with Patient

## 2022-02-04 ENCOUNTER — Other Ambulatory Visit: Payer: Self-pay | Admitting: Family Medicine

## 2022-02-04 DIAGNOSIS — J432 Centrilobular emphysema: Secondary | ICD-10-CM

## 2022-02-05 ENCOUNTER — Inpatient Hospital Stay: Payer: Medicare Other | Admitting: Pharmacist

## 2022-02-05 ENCOUNTER — Inpatient Hospital Stay: Payer: Medicare Other

## 2022-02-05 DIAGNOSIS — C9110 Chronic lymphocytic leukemia of B-cell type not having achieved remission: Secondary | ICD-10-CM

## 2022-02-05 LAB — COMPREHENSIVE METABOLIC PANEL
ALT: 88 U/L — ABNORMAL HIGH (ref 0–44)
AST: 142 U/L — ABNORMAL HIGH (ref 15–41)
Albumin: 3.5 g/dL (ref 3.5–5.0)
Alkaline Phosphatase: 156 U/L — ABNORMAL HIGH (ref 38–126)
Anion gap: 6 (ref 5–15)
BUN: 8 mg/dL (ref 6–20)
CO2: 24 mmol/L (ref 22–32)
Calcium: 8.8 mg/dL — ABNORMAL LOW (ref 8.9–10.3)
Chloride: 104 mmol/L (ref 98–111)
Creatinine, Ser: 0.86 mg/dL (ref 0.44–1.00)
GFR, Estimated: >60 mL/min (ref >60)
Glucose, Bld: 143 mg/dL — ABNORMAL HIGH (ref 70–99)
Potassium: 3.4 mmol/L — ABNORMAL LOW (ref 3.5–5.1)
Sodium: 134 mmol/L — ABNORMAL LOW (ref 135–145)
Total Bilirubin: 0.7 mg/dL (ref 0.3–1.2)
Total Protein: 6.4 g/dL — ABNORMAL LOW (ref 6.5–8.1)

## 2022-02-05 LAB — CBC WITH DIFFERENTIAL/PLATELET
Abs Immature Granulocytes: 1.28 10*3/uL — ABNORMAL HIGH (ref 0.00–0.07)
Basophils Absolute: 0.3 10*3/uL — ABNORMAL HIGH (ref 0.0–0.1)
Basophils Relative: 1 %
Eosinophils Absolute: 0.2 10*3/uL (ref 0.0–0.5)
Eosinophils Relative: 1 %
HCT: 46.5 % — ABNORMAL HIGH (ref 36.0–46.0)
Hemoglobin: 15.8 g/dL — ABNORMAL HIGH (ref 12.0–15.0)
Immature Granulocytes: 7 %
Lymphocytes Relative: 29 %
Lymphs Abs: 5.8 10*3/uL — ABNORMAL HIGH (ref 0.7–4.0)
MCH: 31.9 pg (ref 26.0–34.0)
MCHC: 34 g/dL (ref 30.0–36.0)
MCV: 93.9 fL (ref 80.0–100.0)
Monocytes Absolute: 2.8 10*3/uL — ABNORMAL HIGH (ref 0.1–1.0)
Monocytes Relative: 14 %
Neutro Abs: 9.6 10*3/uL — ABNORMAL HIGH (ref 1.7–7.7)
Neutrophils Relative %: 48 %
Platelets: 133 10*3/uL — ABNORMAL LOW (ref 150–400)
RBC: 4.95 MIL/uL (ref 3.87–5.11)
RDW: 12.9 % (ref 11.5–15.5)
Smear Review: NORMAL
WBC: 19.8 10*3/uL — ABNORMAL HIGH (ref 4.0–10.5)
nRBC: 0.2 % (ref 0.0–0.2)

## 2022-02-05 LAB — HEPATITIS B SURFACE ANTIGEN: Hepatitis B Surface Ag: NONREACTIVE

## 2022-02-05 LAB — HEPATITIS B CORE ANTIBODY, TOTAL: Hep B Core Total Ab: NONREACTIVE

## 2022-02-05 LAB — PHOSPHORUS: Phosphorus: 1.2 mg/dL — ABNORMAL LOW (ref 2.5–4.6)

## 2022-02-05 LAB — LACTATE DEHYDROGENASE: LDH: 930 U/L — ABNORMAL HIGH (ref 98–192)

## 2022-02-05 LAB — URIC ACID: Uric Acid, Serum: 6.2 mg/dL (ref 2.5–7.1)

## 2022-02-05 NOTE — Progress Notes (Signed)
[name]  Telephone:([phone] [phone]    I connected withNAME@ on 02/05/22 at  3:00 PM EDT by telephone and verified that I am speaking with the correct person using two identifiers.  Name of the patient: Monique [name]  [id number]  20-Feb-1971   Location: Patient: home Provider: in office   I discussed the limitations, risks, security and privacy concerns of performing an evaluation and management service by telephone and the availability of in person appointments. I also discussed with the patient that there may be a patient responsible charge related to this service. The patient expressed understanding and agreed to proceed.  HPI: Patient is a 51 y.o. female with progressive CLL, previously treated with ibrutinib. She is being transitioned to treatment with Venclexta (venetoclax) and rtiuxumab. She started her venetoclax ramp up on 02/04/22.  Reason for Consult: Post venetoclax initiation lab review   PAST MEDICAL HISTORY: Past Medical History:  Diagnosis Date   Anxiety    Bursitis    leg pain   Carpal tunnel syndrome    Cervical dysplasia    hx LEEP over 18 years ago.    Chronic lymphocytic leukemia ([place]) 2018   Dr Grayland Ormond.  (in lymph nodes)   COPD (chronic obstructive pulmonary disease) ([place])    COVID-19 2021   Depression    Eating disorder    GERD (gastroesophageal reflux disease)    History of self-harm    Insomnia    Obsession    Tobacco abuse    Vitamin B12 deficiency (non anemic)     HEMATOLOGY/ONCOLOGY HISTORY:  Oncology History Overview Note  Patient with recent CLL diagnosis by flow cytometry.  Previously not being treated in observation.   S/p 4 cycles of weekly Rituxan completing on 02/20/2017 scans from January 2018 revealed minimal progression.   Developed right breast mass in March 2019.  Had diagnostic mammogram performed on 12/26/2017 showed suspicious right breast mass.   Biopsy revealed granulomatous mastitis.  She was treated with doxycycline 100 mg twice daily for 7 days.   Developed worsening adenopathy.  CT chest/abdomen/pelvis and soft tissue neck from May 2019 revealed progressive bulky adenopathy in the chest, abdomen and pelvis compatible with worsening disease.   [name] recommended beginning treatment with Rituxan and Treanda.  Received first cycle last week.  S/p 6 cycles of Rituxan and Treanda.  Tolerating well.  Last dose given 03/25/18.    CLL (chronic lymphocytic leukemia) ([place])  11/24/2016 Initial Diagnosis   CLL (chronic lymphocytic leukemia) ([place])    01/21/2018 - 03/26/2018 Chemotherapy   The patient had palonosetron (ALOXI) injection 0.25 mg, 0.25 mg, Intravenous,  Once, 4 of 5 cycles Administration: 0.25 mg (01/28/2018), 0.25 mg (02/25/2018), 0.25 mg (03/25/2018) riTUXimab (RITUXAN) 700 mg in sodium chloride 0.9 % 250 mL (2.1875 mg/mL) infusion, 375 mg/m2 = 700 mg, Intravenous,  Once, 4 of 5 cycles Administration: 700 mg (01/28/2018) bendamustine (BENDEKA) 150 mg in sodium chloride 0.9 % 50 mL (2.6786 mg/mL) chemo infusion, 90 mg/m2 = 150 mg, Intravenous,  Once, 4 of 5 cycles Administration: 150 mg (01/28/2018), 150 mg (01/29/2018), 150 mg (02/25/2018), 150 mg (02/26/2018), 150 mg (03/25/2018), 150 mg (03/26/2018)   for chemotherapy treatment.     03/12/2022 -  Chemotherapy   Patient is on Treatment Plan : LYMPHOMA CLL/SLL Venetoclax ramp up / Venetoclax + Rituximab (375/500) q28d x 6 cycles / Venetoclax q28d x 18 cycles        ALLERGIES:  has No  Known Allergies.  MEDICATIONS:  Current Outpatient Medications  Medication Sig Dispense Refill   ADVAIR DISKUS 250-50 MCG/ACT AEPB INHALE 1 PUFF BY MOUTH INTO LUNGS TWICE DAILY 180 each 0   ARIPiprazole (ABILIFY) 5 MG tablet Take 1 tablet (5 mg total) by mouth daily. 90 tablet 0   Ascorbic Acid (VITAMIN C) 1000 MG tablet Take 1,000 mg by mouth daily as needed (immune health).     atorvastatin  (LIPITOR) 10 MG tablet Take 1 tablet (10 mg total) by mouth in the morning. 90 tablet 3   benzonatate (TESSALON) 100 MG capsule Take 1 capsule (100 mg total) by mouth 2 (two) times daily as needed for cough. 20 capsule 0   Calcium Carbonate-Vit D-Min (GNP CALCIUM 1200) 1200-1000 MG-UNIT CHEW Chew 1,200 mg by mouth daily with breakfast. Take in combination with vitamin D and magnesium. (Patient taking differently: Chew 2 tablets by mouth in the morning, at noon, and at bedtime. Take in combination with vitamin D and magnesium.) 90 tablet 3   Crisaborole (EUCRISA) 2 % OINT Apply 1 application topically as directed. Qd to bid aa rash on hands and feet until clear, then prn flares 60 g 11   DULoxetine (CYMBALTA) 60 MG capsule Take 1 capsule (60 mg total) by mouth daily. 90 capsule 0   estrogens, conjugated, (PREMARIN) 0.625 MG tablet Take 1 tablet (0.625 mg total) by mouth daily. Take daily for 21 days then do not take for 7 days. 90 tablet 3   loratadine (CLARITIN) 10 MG tablet TAKE ONE TABLET BY MOUTH EVERY MORNING 90 tablet 0   Magnesium 500 MG CAPS Take 500 mg by mouth in the morning.     meloxicam (MOBIC) 15 MG tablet Take 1 tablet (15 mg total) by mouth daily. 90 tablet 1   omeprazole (PRILOSEC) 20 MG capsule Take 1 capsule (20 mg total) by mouth every morning. 90 capsule 3   ondansetron (ZOFRAN) 8 MG tablet Take 1 tablet (8 mg total) by mouth every 8 (eight) hours as needed for nausea or vomiting. 20 tablet 0   Oxymetazoline HCl (RHOFADE) 1 % CREA Qam to face 30 g 11   pregabalin (LYRICA) 100 MG capsule Take 100 mg by mouth 3 (three) times daily.     Ruxolitinib Phosphate (OPZELURA) 1.5 % CREA Apply 1 application topically as directed. Qd to bid aa rash on hands and feet until clear, then prn flares 60 g 11   tiZANidine (ZANAFLEX) 2 MG tablet TAKE ONE TABLET by MOUTH THREE times daily 30 tablet 0   venetoclax (VENCLEXTA) 10 & 50 & 100 MG Starter Pack Take by mouth daily. Take 20 mg for 7 days, then  50 mg daily x 7d, then 100 mg daily x 7d, then 200 mg daily x 7d. Take with food & water. 42 tablet 0   vitamin B-12 (CYANOCOBALAMIN) 500 MCG tablet Take 250 mcg by mouth every morning.     No current facility-administered medications for this visit.    VITAL SIGNS: LMP  (LMP Unknown)  There were no vitals filed for this visit.  Estimated body mass index is 26.94 kg/m as calculated from the following:   Height as of 01/23/22: 5' 7"  (1.702 m).   Weight as of 01/23/22: 78 kg (172 lb).  LABS: CBC:    Component Value Date/Time   WBC 19.8 (H) 02/05/2022 1134   HGB 15.8 (H) 02/05/2022 1134   HCT 46.5 (H) 02/05/2022 1134   PLT 133 (L) 02/05/2022 1134  MCV 93.9 02/05/2022 1134   NEUTROABS 9.6 (H) 02/05/2022 1134   LYMPHSABS 5.8 (H) 02/05/2022 1134   MONOABS 2.8 (H) 02/05/2022 1134   EOSABS 0.2 02/05/2022 1134   BASOSABS 0.3 (H) 02/05/2022 1134   Comprehensive Metabolic Panel:    Component Value Date/Time   NA 134 (L) 02/05/2022 1134   NA 138 05/13/2018 1413   K 3.4 (L) 02/05/2022 1134   CL 104 02/05/2022 1134   CO2 24 02/05/2022 1134   BUN 8 02/05/2022 1134   BUN 15 05/13/2018 1413   CREATININE 0.86 02/05/2022 1134   CREATININE 0.96 12/06/2020 1646   GLUCOSE 143 (H) 02/05/2022 1134   CALCIUM 8.8 (L) 02/05/2022 1134   AST 142 (H) 02/05/2022 1134   ALT 88 (H) 02/05/2022 1134   ALKPHOS 156 (H) 02/05/2022 1134   BILITOT 0.7 02/05/2022 1134   BILITOT 0.4 05/13/2018 1413   PROT 6.4 (L) 02/05/2022 1134   PROT 6.3 05/13/2018 1413   ALBUMIN 3.5 02/05/2022 1134   ALBUMIN 4.1 05/13/2018 1413    On phone during today's visit: patient only  Assessment and Plan-  AST/ALT/alk phos elevated, unusual because patient just started venetoclax yesterday and previously labs were wnl Continue venetoclax ramp up, but patient is scheduled to return clinic on Thursday for repeat labs The rest of her labs were unremarkable, ALC slightly decreased, LDH elevated as expected given progressive  disease that lead to treatment change   Patient expressed understanding and was in agreement with this plan. She also understands that She can call clinic at any time with any questions, concerns, or complaints.   Follow-up plan: RTC for labs on Thursday  Thank you for allowing me to participate in the care of this very pleasant patient.   Time Total: 15 mins of non face-to-face telephone visit time during this encounter  Visit consisted of counseling and education on dealing with issues of symptom management in the setting of serious and potentially life-threatening illness.Greater than 50%  of this time was spent counseling and coordinating care related to the above assessment and plan.   [name], PharmD, BCPS, BCOP, CPP Hematology/Oncology Clinical Pharmacist Practitioner [place]/DB/AP [facility] [phone]  02/05/2022 3:50 PM

## 2022-02-07 ENCOUNTER — Inpatient Hospital Stay: Payer: Medicare Other

## 2022-02-07 ENCOUNTER — Telehealth: Payer: Self-pay | Admitting: *Deleted

## 2022-02-07 ENCOUNTER — Other Ambulatory Visit: Payer: Self-pay

## 2022-02-07 ENCOUNTER — Inpatient Hospital Stay: Payer: Medicare Other | Admitting: Pharmacist

## 2022-02-07 DIAGNOSIS — C9110 Chronic lymphocytic leukemia of B-cell type not having achieved remission: Secondary | ICD-10-CM

## 2022-02-07 LAB — COMPREHENSIVE METABOLIC PANEL
ALT: 229 U/L — ABNORMAL HIGH (ref 0–44)
AST: 96 U/L — ABNORMAL HIGH (ref 15–41)
Albumin: 3.3 g/dL — ABNORMAL LOW (ref 3.5–5.0)
Alkaline Phosphatase: 387 U/L — ABNORMAL HIGH (ref 38–126)
Anion gap: 7 (ref 5–15)
BUN: 10 mg/dL (ref 6–20)
CO2: 26 mmol/L (ref 22–32)
Calcium: 8.4 mg/dL — ABNORMAL LOW (ref 8.9–10.3)
Chloride: 103 mmol/L (ref 98–111)
Creatinine, Ser: 0.77 mg/dL (ref 0.44–1.00)
GFR, Estimated: >60 mL/min (ref >60)
Glucose, Bld: 97 mg/dL (ref 70–99)
Potassium: 3.7 mmol/L (ref 3.5–5.1)
Sodium: 136 mmol/L (ref 135–145)
Total Bilirubin: 1.7 mg/dL — ABNORMAL HIGH (ref 0.3–1.2)
Total Protein: 6.2 g/dL — ABNORMAL LOW (ref 6.5–8.1)

## 2022-02-07 LAB — CBC WITH DIFFERENTIAL/PLATELET
Abs Immature Granulocytes: 0.81 10*3/uL — ABNORMAL HIGH (ref 0.00–0.07)
Basophils Absolute: 0.2 10*3/uL — ABNORMAL HIGH (ref 0.0–0.1)
Basophils Relative: 1 %
Eosinophils Absolute: 0.4 10*3/uL (ref 0.0–0.5)
Eosinophils Relative: 2 %
HCT: 44.6 % (ref 36.0–46.0)
Hemoglobin: 15.5 g/dL — ABNORMAL HIGH (ref 12.0–15.0)
Immature Granulocytes: 4 %
Lymphocytes Relative: 30 %
Lymphs Abs: 6 10*3/uL — ABNORMAL HIGH (ref 0.7–4.0)
MCH: 31.8 pg (ref 26.0–34.0)
MCHC: 34.8 g/dL (ref 30.0–36.0)
MCV: 91.6 fL (ref 80.0–100.0)
Monocytes Absolute: 6.6 10*3/uL — ABNORMAL HIGH (ref 0.1–1.0)
Monocytes Relative: 33 %
Neutro Abs: 5.8 10*3/uL (ref 1.7–7.7)
Neutrophils Relative %: 30 %
Platelets: 68 10*3/uL — ABNORMAL LOW (ref 150–400)
RBC: 4.87 MIL/uL (ref 3.87–5.11)
RDW: 12.7 % (ref 11.5–15.5)
Smear Review: NORMAL
WBC: 19.7 10*3/uL — ABNORMAL HIGH (ref 4.0–10.5)
nRBC: 0 % (ref 0.0–0.2)

## 2022-02-07 LAB — URIC ACID: Uric Acid, Serum: 5.9 mg/dL (ref 2.5–7.1)

## 2022-02-07 LAB — PHOSPHORUS: Phosphorus: <1 mg/dL — CL (ref 2.5–4.6)

## 2022-02-07 NOTE — Progress Notes (Signed)
[facility]  [phone] [phone]    I connected with Ms. Senner on 02/07/22 at  2:45 PM EDT by telephone and verified that I am speaking with the correct person using two identifiers.  Name of the patient: Monique [name]  [id number]  [birth year]   Location: Patient: home Provider: in office   I discussed the limitations, risks, security and privacy concerns of performing an evaluation and management service by telephone and the availability of in person appointments. I also discussed with the patient that there may be a patient responsible charge related to this service. The patient expressed understanding and agreed to proceed.  HPI: Patient is a 51 y.o. female with progressive CLL, previously treated with ibrutinib. She is being transitioned to treatment with Venclexta (venetoclax) and rtiuxumab. She started her venetoclax ramp up on 02/04/22.  Reason for Consult: Post venetoclax initiation lab review   PAST MEDICAL HISTORY: Past Medical History:  Diagnosis Date   Anxiety    Bursitis    leg pain   Carpal tunnel syndrome    Cervical dysplasia    hx LEEP over 18 years ago.    Chronic lymphocytic leukemia ([place]) 2018   Dr Grayland Ormond.  (in lymph nodes)   COPD (chronic obstructive pulmonary disease) ([place])    COVID-19 2021   Depression    Eating disorder    GERD (gastroesophageal reflux disease)    History of self-harm    Insomnia    Obsession    Tobacco abuse    Vitamin B12 deficiency (non anemic)     HEMATOLOGY/ONCOLOGY HISTORY:  Oncology History Overview Note  Patient with recent CLL diagnosis by flow cytometry.  Previously not being treated in observation.   S/p 4 cycles of weekly Rituxan completing on 02/20/2017 scans from January 2018 revealed minimal progression.   Developed right breast mass in March 2019.  Had diagnostic mammogram performed on 12/26/2017 showed suspicious right breast mass.   Biopsy revealed granulomatous mastitis.  She was treated with doxycycline 100 mg twice daily for 7 days.   Developed worsening adenopathy.  CT chest/abdomen/pelvis and soft tissue neck from May 2019 revealed progressive bulky adenopathy in the chest, abdomen and pelvis compatible with worsening disease.   [name] recommended beginning treatment with Rituxan and Treanda.  Received first cycle last week.  S/p 6 cycles of Rituxan and Treanda.  Tolerating well.  Last dose given 03/25/18.    CLL (chronic lymphocytic leukemia) ([place])  11/24/2016 Initial Diagnosis   CLL (chronic lymphocytic leukemia) ([place])    01/21/2018 - 03/26/2018 Chemotherapy   The patient had palonosetron (ALOXI) injection 0.25 mg, 0.25 mg, Intravenous,  Once, 4 of 5 cycles Administration: 0.25 mg (01/28/2018), 0.25 mg (02/25/2018), 0.25 mg (03/25/2018) riTUXimab (RITUXAN) 700 mg in sodium chloride 0.9 % 250 mL (2.1875 mg/mL) infusion, 375 mg/m2 = 700 mg, Intravenous,  Once, 4 of 5 cycles Administration: 700 mg (01/28/2018) bendamustine (BENDEKA) 150 mg in sodium chloride 0.9 % 50 mL (2.6786 mg/mL) chemo infusion, 90 mg/m2 = 150 mg, Intravenous,  Once, 4 of 5 cycles Administration: 150 mg (01/28/2018), 150 mg (01/29/2018), 150 mg (02/25/2018), 150 mg (02/26/2018), 150 mg (03/25/2018), 150 mg (03/26/2018)   for chemotherapy treatment.     03/12/2022 -  Chemotherapy   Patient is on Treatment Plan : LYMPHOMA CLL/SLL Venetoclax ramp up / Venetoclax + Rituximab (375/500) q28d x 6 cycles / Venetoclax q28d x 18 cycles        ALLERGIES:  has No Known Allergies.  MEDICATIONS:  Current Outpatient Medications  Medication Sig Dispense Refill   ADVAIR DISKUS 250-50 MCG/ACT AEPB INHALE 1 PUFF BY MOUTH INTO LUNGS TWICE DAILY 180 each 0   ARIPiprazole (ABILIFY) 5 MG tablet Take 1 tablet (5 mg total) by mouth daily. 90 tablet 0   Ascorbic Acid (VITAMIN C) 1000 MG tablet Take 1,000 mg by mouth daily as needed (immune health).     atorvastatin  (LIPITOR) 10 MG tablet Take 1 tablet (10 mg total) by mouth in the morning. 90 tablet 3   benzonatate (TESSALON) 100 MG capsule Take 1 capsule (100 mg total) by mouth 2 (two) times daily as needed for cough. 20 capsule 0   Calcium Carbonate-Vit D-Min (GNP CALCIUM 1200) 1200-1000 MG-UNIT CHEW Chew 1,200 mg by mouth daily with breakfast. Take in combination with vitamin D and magnesium. (Patient taking differently: Chew 2 tablets by mouth in the morning, at noon, and at bedtime. Take in combination with vitamin D and magnesium.) 90 tablet 3   Crisaborole (EUCRISA) 2 % OINT Apply 1 application topically as directed. Qd to bid aa rash on hands and feet until clear, then prn flares 60 g 11   DULoxetine (CYMBALTA) 60 MG capsule Take 1 capsule (60 mg total) by mouth daily. 90 capsule 0   estrogens, conjugated, (PREMARIN) 0.625 MG tablet Take 1 tablet (0.625 mg total) by mouth daily. Take daily for 21 days then do not take for 7 days. 90 tablet 3   loratadine (CLARITIN) 10 MG tablet TAKE ONE TABLET BY MOUTH EVERY MORNING 90 tablet 0   Magnesium 500 MG CAPS Take 500 mg by mouth in the morning.     meloxicam (MOBIC) 15 MG tablet Take 1 tablet (15 mg total) by mouth daily. 90 tablet 1   omeprazole (PRILOSEC) 20 MG capsule Take 1 capsule (20 mg total) by mouth every morning. 90 capsule 3   ondansetron (ZOFRAN) 8 MG tablet Take 1 tablet (8 mg total) by mouth every 8 (eight) hours as needed for nausea or vomiting. 20 tablet 0   Oxymetazoline HCl (RHOFADE) 1 % CREA Qam to face 30 g 11   pregabalin (LYRICA) 100 MG capsule Take 100 mg by mouth 3 (three) times daily.     Ruxolitinib Phosphate (OPZELURA) 1.5 % CREA Apply 1 application topically as directed. Qd to bid aa rash on hands and feet until clear, then prn flares 60 g 11   tiZANidine (ZANAFLEX) 2 MG tablet TAKE ONE TABLET by MOUTH THREE times daily 30 tablet 0   venetoclax (VENCLEXTA) 10 & 50 & 100 MG Starter Pack Take by mouth daily. Take 20 mg for 7 days, then  50 mg daily x 7d, then 100 mg daily x 7d, then 200 mg daily x 7d. Take with food & water. 42 tablet 0   vitamin B-12 (CYANOCOBALAMIN) 500 MCG tablet Take 250 mcg by mouth every morning.     No current facility-administered medications for this visit.    VITAL SIGNS: LMP  (LMP Unknown)  There were no vitals filed for this visit.  Estimated body mass index is 26.94 kg/m as calculated from the following:   Height as of 01/23/22: 5' 7" (1.702 m).   Weight as of 01/23/22: 78 kg (172 lb).  LABS: CBC:    Component Value Date/Time   WBC 19.7 (H) 02/07/2022 1350   HGB 15.5 (H) 02/07/2022 1350   HCT 44.6 02/07/2022 1350   PLT 68 (L) 02/07/2022 1350  MCV 91.6 02/07/2022 1350   NEUTROABS 5.8 02/07/2022 1350   LYMPHSABS 6.0 (H) 02/07/2022 1350   MONOABS 6.6 (H) 02/07/2022 1350   EOSABS 0.4 02/07/2022 1350   BASOSABS 0.2 (H) 02/07/2022 1350   Comprehensive Metabolic Panel:    Component Value Date/Time   NA 136 02/07/2022 1350   NA 138 05/13/2018 1413   K 3.7 02/07/2022 1350   CL 103 02/07/2022 1350   CO2 26 02/07/2022 1350   BUN 10 02/07/2022 1350   BUN 15 05/13/2018 1413   CREATININE 0.77 02/07/2022 1350   CREATININE 0.96 12/06/2020 1646   GLUCOSE 97 02/07/2022 1350   CALCIUM 8.4 (L) 02/07/2022 1350   AST 96 (H) 02/07/2022 1350   ALT 229 (H) 02/07/2022 1350   ALKPHOS 387 (H) 02/07/2022 1350   BILITOT 1.7 (H) 02/07/2022 1350   BILITOT 0.4 05/13/2018 1413   PROT 6.2 (L) 02/07/2022 1350   PROT 6.3 05/13/2018 1413   ALBUMIN 3.3 (L) 02/07/2022 1350   ALBUMIN 4.1 05/13/2018 1413    On phone during today's visit: patient only  Assessment and Plan-  ALT and alk phos further increased, AST remains elevated, and t.bili now elevated Patient was instructed to stop her venetoclax (note, she has already taken her dose for today)  She will RTC for labs on Continue venetoclax ramp up, but patient is scheduled to return clinic on Thursday for repeat labs Patient reported darkening of her  urine despite increased hydration and over the last few days and 2 incidence of vomiting Patient denies any new medications recently besides venetoclax  Patient expressed understanding and was in agreement with this plan. She also understands that She can call clinic at any time with any questions, concerns, or complaints.   Follow-up plan: RTC for labs on Monday  Thank you for allowing me to participate in the care of this very pleasant patient.   Time Total: 15 mins of non face-to-face telephone visit time during this encounter  Visit consisted of counseling and education on dealing with issues of symptom management in the setting of serious and potentially life-threatening illness.Greater than 50%  of this time was spent counseling and coordinating care related to the above assessment and plan.   [name], PharmD, BCPS, BCOP, CPP Hematology/Oncology Clinical Pharmacist Practitioner [place]/DB/AP [facility] [phone]  02/07/2022 3:03 PM

## 2022-02-07 NOTE — Telephone Encounter (Signed)
Called report Component Ref Range & Units 13:50 (02/07/22) 2 d ago (02/05/22) 1 yr ago (09/28/20) 3 yr ago (01/27/19) 3 yr ago (12/10/18) 3 yr ago (10/22/18) 3 yr ago (09/24/18)  Phosphorus 2.5 - 4.6 mg/dL <1.0 Low Panic   1.2 Low  CM  3.0 CM  2.7 CM  3.9 CM  3.3 CM  3.4 CM   Comment: CRITICAL RESULT CALLED TO, READ BACK BY AND VERIFIED WITH  Erman Thum AT 1441 02/07/22 DAS  Performed at [facility], [address] [place], [place] [address]   Resulting Agency  [facility]         Specimen Collected: 02/07/22 13:50 Last Resulted: 02/07/22 14:42

## 2022-02-11 ENCOUNTER — Other Ambulatory Visit: Payer: Self-pay | Admitting: Family Medicine

## 2022-02-11 DIAGNOSIS — I700 Atherosclerosis of aorta: Secondary | ICD-10-CM

## 2022-02-12 ENCOUNTER — Ambulatory Visit: Payer: Medicaid Other | Admitting: Pharmacist

## 2022-02-12 ENCOUNTER — Inpatient Hospital Stay: Payer: Medicare Other | Admitting: Pharmacist

## 2022-02-12 ENCOUNTER — Other Ambulatory Visit: Payer: Self-pay

## 2022-02-12 ENCOUNTER — Inpatient Hospital Stay: Payer: Medicare Other

## 2022-02-12 ENCOUNTER — Encounter: Payer: Self-pay | Admitting: Family Medicine

## 2022-02-12 DIAGNOSIS — C9110 Chronic lymphocytic leukemia of B-cell type not having achieved remission: Secondary | ICD-10-CM

## 2022-02-12 DIAGNOSIS — I700 Atherosclerosis of aorta: Secondary | ICD-10-CM

## 2022-02-12 LAB — COMPREHENSIVE METABOLIC PANEL
ALT: 77 U/L — ABNORMAL HIGH (ref 0–44)
AST: 32 U/L (ref 15–41)
Albumin: 3.2 g/dL — ABNORMAL LOW (ref 3.5–5.0)
Alkaline Phosphatase: 253 U/L — ABNORMAL HIGH (ref 38–126)
Anion gap: 7 (ref 5–15)
BUN: 16 mg/dL (ref 6–20)
CO2: 26 mmol/L (ref 22–32)
Calcium: 8 mg/dL — ABNORMAL LOW (ref 8.9–10.3)
Chloride: 105 mmol/L (ref 98–111)
Creatinine, Ser: 0.75 mg/dL (ref 0.44–1.00)
GFR, Estimated: >60 mL/min (ref >60)
Glucose, Bld: 85 mg/dL (ref 70–99)
Potassium: 3.9 mmol/L (ref 3.5–5.1)
Sodium: 138 mmol/L (ref 135–145)
Total Bilirubin: 0.7 mg/dL (ref 0.3–1.2)
Total Protein: 6.1 g/dL — ABNORMAL LOW (ref 6.5–8.1)

## 2022-02-12 LAB — LACTATE DEHYDROGENASE: LDH: 797 U/L — ABNORMAL HIGH (ref 98–192)

## 2022-02-12 LAB — CBC WITH DIFFERENTIAL/PLATELET
Abs Immature Granulocytes: 0.55 10*3/uL — ABNORMAL HIGH (ref 0.00–0.07)
Basophils Absolute: 0.2 10*3/uL — ABNORMAL HIGH (ref 0.0–0.1)
Basophils Relative: 1 %
Eosinophils Absolute: 0.3 10*3/uL (ref 0.0–0.5)
Eosinophils Relative: 1 %
HCT: 41.7 % (ref 36.0–46.0)
Hemoglobin: 14.2 g/dL (ref 12.0–15.0)
Immature Granulocytes: 2 %
Lymphocytes Relative: 38 %
Lymphs Abs: 11.3 10*3/uL — ABNORMAL HIGH (ref 0.7–4.0)
MCH: 31.3 pg (ref 26.0–34.0)
MCHC: 34.1 g/dL (ref 30.0–36.0)
MCV: 91.9 fL (ref 80.0–100.0)
Monocytes Absolute: 12.9 10*3/uL — ABNORMAL HIGH (ref 0.1–1.0)
Monocytes Relative: 42 %
Neutro Abs: 4.7 10*3/uL (ref 1.7–7.7)
Neutrophils Relative %: 16 %
Platelets: 78 10*3/uL — ABNORMAL LOW (ref 150–400)
RBC: 4.54 MIL/uL (ref 3.87–5.11)
RDW: 13.2 % (ref 11.5–15.5)
Smear Review: NORMAL
WBC: 30 10*3/uL — ABNORMAL HIGH (ref 4.0–10.5)
nRBC: 0.1 % (ref 0.0–0.2)

## 2022-02-12 LAB — PHOSPHORUS: Phosphorus: 5.1 mg/dL — ABNORMAL HIGH (ref 2.5–4.6)

## 2022-02-12 LAB — URIC ACID: Uric Acid, Serum: 6.9 mg/dL (ref 2.5–7.1)

## 2022-02-12 MED ORDER — ATORVASTATIN CALCIUM 10 MG PO TABS: 10.0000 mg | ORAL_TABLET | Freq: Every morning | ORAL | 0 refills | Status: DC

## 2022-02-12 NOTE — Progress Notes (Signed)
[facility]  [phone] [phone]    I connected with Monique [name] on 02/12/22 at  3:00 PM EDT by telephone and verified that I am speaking with the correct person using two identifiers.  Name of the patient: Monique [name]  [id number]  22-May-1971   Location: Patient: home Provider: in office   I discussed the limitations, risks, security and privacy concerns of performing an evaluation and management service by telephone and the availability of in person appointments. I also discussed with the patient that there may be a patient responsible charge related to this service. The patient expressed understanding and agreed to proceed.  HPI: Patient is a 51 y.o. female with progressive CLL, previously treated with ibrutinib. She is being transitioned to treatment with Venclexta (venetoclax) and rtiuxumab. She started her venetoclax ramp up on 02/04/22. Due to changes in her liver venetoclax was held on 02/07/22  Reason for Consult:  lab review   PAST MEDICAL HISTORY: Past Medical History:  Diagnosis Date   Anxiety    Bursitis    leg pain   Carpal tunnel syndrome    Cervical dysplasia    hx LEEP over 18 years ago.    Chronic lymphocytic leukemia ([place]) 2018   Dr Grayland Ormond.  (in lymph nodes)   COPD (chronic obstructive pulmonary disease) ([place])    COVID-19 2021   Depression    Eating disorder    GERD (gastroesophageal reflux disease)    History of self-harm    Insomnia    Obsession    Tobacco abuse    Vitamin B12 deficiency (non anemic)     HEMATOLOGY/ONCOLOGY HISTORY:  Oncology History Overview Note  Patient with recent CLL diagnosis by flow cytometry.  Previously not being treated in observation.   S/p 4 cycles of weekly Rituxan completing on 02/20/2017 scans from January 2018 revealed minimal progression.   Developed right breast mass in March 2019.  Had diagnostic mammogram performed on 12/26/2017  showed suspicious right breast mass.  Biopsy revealed granulomatous mastitis.  She was treated with doxycycline 100 mg twice daily for 7 days.   Developed worsening adenopathy.  CT chest/abdomen/pelvis and soft tissue neck from May 2019 revealed progressive bulky adenopathy in the chest, abdomen and pelvis compatible with worsening disease.   [name] recommended beginning treatment with Rituxan and Treanda.  Received first cycle last week.  S/p 6 cycles of Rituxan and Treanda.  Tolerating well.  Last dose given 03/25/18.    CLL (chronic lymphocytic leukemia) ([place])  11/24/2016 Initial Diagnosis   CLL (chronic lymphocytic leukemia) ([place])    01/21/2018 - 03/26/2018 Chemotherapy   The patient had palonosetron (ALOXI) injection 0.25 mg, 0.25 mg, Intravenous,  Once, 4 of 5 cycles Administration: 0.25 mg (01/28/2018), 0.25 mg (02/25/2018), 0.25 mg (03/25/2018) riTUXimab (RITUXAN) 700 mg in sodium chloride 0.9 % 250 mL (2.1875 mg/mL) infusion, 375 mg/m2 = 700 mg, Intravenous,  Once, 4 of 5 cycles Administration: 700 mg (01/28/2018) bendamustine (BENDEKA) 150 mg in sodium chloride 0.9 % 50 mL (2.6786 mg/mL) chemo infusion, 90 mg/m2 = 150 mg, Intravenous,  Once, 4 of 5 cycles Administration: 150 mg (01/28/2018), 150 mg (01/29/2018), 150 mg (02/25/2018), 150 mg (02/26/2018), 150 mg (03/25/2018), 150 mg (03/26/2018)   for chemotherapy treatment.     03/12/2022 -  Chemotherapy   Patient is on Treatment Plan : LYMPHOMA CLL/SLL Venetoclax ramp up / Venetoclax + Rituximab (375/500) q28d x 6 cycles / Venetoclax q28d x 18 cycles  ALLERGIES:  has No Known Allergies.  MEDICATIONS:  Current Outpatient Medications  Medication Sig Dispense Refill   ADVAIR DISKUS 250-50 MCG/ACT AEPB INHALE 1 PUFF BY MOUTH INTO LUNGS TWICE DAILY 180 each 0   ARIPiprazole (ABILIFY) 5 MG tablet Take 1 tablet (5 mg total) by mouth daily. 90 tablet 0   Ascorbic Acid (VITAMIN C) 1000 MG tablet Take 1,000 mg by mouth daily as  needed (immune health).     atorvastatin (LIPITOR) 10 MG tablet Take 1 tablet (10 mg total) by mouth in the morning. 90 tablet 0   benzonatate (TESSALON) 100 MG capsule Take 1 capsule (100 mg total) by mouth 2 (two) times daily as needed for cough. 20 capsule 0   Calcium Carbonate-Vit D-Min (GNP CALCIUM 1200) 1200-1000 MG-UNIT CHEW Chew 1,200 mg by mouth daily with breakfast. Take in combination with vitamin D and magnesium. (Patient taking differently: Chew 2 tablets by mouth in the morning, at noon, and at bedtime. Take in combination with vitamin D and magnesium.) 90 tablet 3   Crisaborole (EUCRISA) 2 % OINT Apply 1 application topically as directed. Qd to bid aa rash on hands and feet until clear, then prn flares 60 g 11   DULoxetine (CYMBALTA) 60 MG capsule Take 1 capsule (60 mg total) by mouth daily. 90 capsule 0   estrogens, conjugated, (PREMARIN) 0.625 MG tablet Take 1 tablet (0.625 mg total) by mouth daily. Take daily for 21 days then do not take for 7 days. 90 tablet 3   loratadine (CLARITIN) 10 MG tablet TAKE ONE TABLET BY MOUTH EVERY MORNING 90 tablet 0   Magnesium 500 MG CAPS Take 500 mg by mouth in the morning.     meloxicam (MOBIC) 15 MG tablet Take 1 tablet (15 mg total) by mouth daily. 90 tablet 1   omeprazole (PRILOSEC) 20 MG capsule Take 1 capsule (20 mg total) by mouth every morning. 90 capsule 3   ondansetron (ZOFRAN) 8 MG tablet Take 1 tablet (8 mg total) by mouth every 8 (eight) hours as needed for nausea or vomiting. 20 tablet 0   Oxymetazoline HCl (RHOFADE) 1 % CREA Qam to face 30 g 11   pregabalin (LYRICA) 100 MG capsule Take 100 mg by mouth 3 (three) times daily.     Ruxolitinib Phosphate (OPZELURA) 1.5 % CREA Apply 1 application topically as directed. Qd to bid aa rash on hands and feet until clear, then prn flares 60 g 11   tiZANidine (ZANAFLEX) 2 MG tablet TAKE ONE TABLET by MOUTH THREE times daily 30 tablet 0   venetoclax (VENCLEXTA) 10 & 50 & 100 MG Starter Pack Take  by mouth daily. Take 20 mg for 7 days, then 50 mg daily x 7d, then 100 mg daily x 7d, then 200 mg daily x 7d. Take with food & water. 42 tablet 0   vitamin B-12 (CYANOCOBALAMIN) 500 MCG tablet Take 250 mcg by mouth every morning.     No current facility-administered medications for this visit.    VITAL SIGNS: LMP  (LMP Unknown)  There were no vitals filed for this visit.  Estimated body mass index is 26.94 kg/m as calculated from the following:   Height as of 01/23/22: _0  (1.702 m).   Weight as of 01/23/22: 78 kg (172 lb).  LABS: CBC:    Component Value Date/Time   WBC 30.0 (H) 02/12/2022 1134   HGB 14.2 02/12/2022 1134   HCT 41.7 02/12/2022 1134   PLT 78 (L) 02/12/2022  1134   MCV 91.9 02/12/2022 1134   NEUTROABS 4.7 02/12/2022 1134   LYMPHSABS 11.3 (H) 02/12/2022 1134   MONOABS 12.9 (H) 02/12/2022 1134   EOSABS 0.3 02/12/2022 1134   BASOSABS 0.2 (H) 02/12/2022 1134   Comprehensive Metabolic Panel:    Component Value Date/Time   NA 138 02/12/2022 1134   NA 138 05/13/2018 1413   K 3.9 02/12/2022 1134   CL 105 02/12/2022 1134   CO2 26 02/12/2022 1134   BUN 16 02/12/2022 1134   BUN 15 05/13/2018 1413   CREATININE 0.75 02/12/2022 1134   CREATININE 0.96 12/06/2020 1646   GLUCOSE 85 02/12/2022 1134   CALCIUM 8.0 (L) 02/12/2022 1134   AST 32 02/12/2022 1134   ALT 77 (H) 02/12/2022 1134   ALKPHOS 253 (H) 02/12/2022 1134   BILITOT 0.7 02/12/2022 1134   BILITOT 0.4 05/13/2018 1413   PROT 6.1 (L) 02/12/2022 1134   PROT 6.3 05/13/2018 1413   ALBUMIN 3.2 (L) 02/12/2022 1134   ALBUMIN 4.1 05/13/2018 1413    On phone during today's visit: patient only  Assessment and Plan-  Since holding her venetoclax, ALT/ALT/alk phos/t.bili have decreased Patient reports that her urine color has lightened (likely related to the decrease in t.bili)  Spoke with [name] who would like this to try reinitiating treatment. Ms. Considine is adamantly opposed to restarting the venetoclax,  because the medication made her "feel like she was dying" Dr. Oleta Mouse informed, pt wil RTC next week to discussion further treatment options  Patient expressed understanding and was in agreement with this plan. She also understands that She can call clinic at any time with any questions, concerns, or complaints.   Follow-up plan: RTC next week  Thank you for allowing me to participate in the care of this very pleasant patient.   Time Total: 15 mins of non face-to-face telephone visit time during this encounter  Visit consisted of counseling and education on dealing with issues of symptom management in the setting of serious and potentially life-threatening illness.Greater than 50%  of this time was spent counseling and coordinating care related to the above assessment and plan.   [name], PharmD, BCPS, BCOP, CPP Hematology/Oncology Clinical Pharmacist Practitioner [place]/DB/AP [facility] [phone]  02/12/2022 4:18 PM

## 2022-02-18 ENCOUNTER — Other Ambulatory Visit (HOSPITAL_COMMUNITY): Payer: Self-pay

## 2022-02-18 NOTE — Progress Notes (Unsigned)
[facility]  [phone] [phone]  ID: Monique [name] OB: [birth year]  MR#: [id number]  GQQ#:[id number]  Patient Care Team: [name], MD as PCP - General (Family Medicine) Lloyd Huger, MD as Consulting Physician (Oncology) Lane Hacker, [employer] as Pharmacist (Pharmacist) Greg Cutter, LCSW as [place] Management (Licensed Clinical Social Worker)  CHIEF COMPLAINT: CLL, iron deficiency.  INTERVAL HISTORY: Patient returns to clinic today for further evaluation and treatment planning.  She could not tolerate venetoclax feeling like "death" and several days of watery diarrhea.  She currently feels well and is back to her baseline.  She denies any fevers or night sweats.  She has a good appetite and denies weight loss.  She has no chest pain, shortness of breath, cough, or hemoptysis.  She denies any nausea, vomiting, constipation, or diarrhea.  She has no melena or hematochezia.  She has no urinary complaints.  Patient offers no further specific complaints today.  REVIEW OF SYSTEMS:   Review of Systems  Constitutional:  Negative for diaphoresis, fever, malaise/fatigue and weight loss.  HENT: Negative.  Negative for congestion.   Respiratory: Negative.  Negative for cough and shortness of breath.   Cardiovascular: Negative.  Negative for chest pain and leg swelling.  Gastrointestinal:  Negative for abdominal pain and constipation.  Genitourinary: Negative.  Negative for dysuria and frequency.  Musculoskeletal: Negative.  Negative for back pain, myalgias and neck pain.  Skin: Negative.   Neurological:  Positive for sensory change. Negative for tingling, focal weakness and weakness.  Psychiatric/Behavioral: Negative.  Negative for depression and suicidal ideas. The patient is not nervous/anxious.    As per HPI. Otherwise, a complete review of systems is negative.  PAST MEDICAL HISTORY: Past Medical History:   Diagnosis Date   Anxiety    Bursitis    leg pain   Carpal tunnel syndrome    Cervical dysplasia    hx LEEP over 18 years ago.    Chronic lymphocytic leukemia ([place]) 2018   Dr Grayland Ormond.  (in lymph nodes)   COPD (chronic obstructive pulmonary disease) ([place])    COVID-19 2021   Depression    Eating disorder    GERD (gastroesophageal reflux disease)    History of self-harm    Insomnia    Obsession    Tobacco abuse    Vitamin B12 deficiency (non anemic)     PAST SURGICAL HISTORY: Past Surgical History:  Procedure Laterality Date   BREAST BIOPSY Right 01/05/2018   US guided biopsy of 2 areas and 1 lymph node, MIXED INFLAMMATION AND GIANT CELL REACTION   CERVICAL BIOPSY  W/ LOOP ELECTRODE EXCISION     COLONOSCOPY WITH PROPOFOL N/A 04/21/2019   Procedure: COLONOSCOPY WITH PROPOFOL;  Surgeon: Virgel Manifold, MD;  Location: [facility] ENDOSCOPY;  Service: Gastroenterology;  Laterality: N/A;   OTHER SURGICAL HISTORY     scar tissue removed from vocal cords   TUBAL LIGATION     VAGINAL HYSTERECTOMY N/A 01/22/2021   Procedure: HYSTERECTOMY VAGINAL; BILATERAL SALPINGECTOMY;  Surgeon: Rubie Maid, MD;  Location: [facility] ORS;  Service: Gynecology;  Laterality: N/A;   vocal cord surgery  2005    FAMILY HISTORY: Family History  Problem Relation Age of Onset   Depression Mother    Cancer Mother        thyroid   Alcohol abuse Father    Alcohol abuse Brother    Depression Brother    Bipolar disorder Brother    Suicidality  Brother    ADD / ADHD Son    Breast cancer Neg Hx     ADVANCED DIRECTIVES (Y/N):  N  HEALTH MAINTENANCE: Social History   Tobacco Use   Smoking status: Every Day    Packs/day: 1.00    Years: 31.00    Pack years: 31.00    Types: Cigarettes    Start date: 09/25/1986   Smokeless tobacco: Never  Vaping Use   Vaping Use: Former  Substance Use Topics   Alcohol use: Not Currently    Alcohol/week: 0.0 standard drinks    Comment: rarely   Drug use: Yes     Frequency: 7.0 times per week    Types: Marijuana     Colonoscopy:  PAP:  Bone density:  Lipid panel:  No Known Allergies  Current Outpatient Medications  Medication Sig Dispense Refill   ARIPiprazole (ABILIFY) 5 MG tablet Take 1 tablet (5 mg total) by mouth daily. 90 tablet 0   Ascorbic Acid (VITAMIN C) 1000 MG tablet Take 1,000 mg by mouth daily as needed (immune health).     atorvastatin (LIPITOR) 10 MG tablet Take 1 tablet (10 mg total) by mouth in the morning. 90 tablet 0   benzonatate (TESSALON) 100 MG capsule Take 1 capsule (100 mg total) by mouth 2 (two) times daily as needed for cough. 20 capsule 0   Calcium Carbonate-Vit D-Min (GNP CALCIUM 1200) 1200-1000 MG-UNIT CHEW Chew 1,200 mg by mouth daily with breakfast. Take in combination with vitamin D and magnesium. (Patient taking differently: Chew 2 tablets by mouth in the morning, at noon, and at bedtime. Take in combination with vitamin D and magnesium.) 90 tablet 3   Crisaborole (EUCRISA) 2 % OINT Apply 1 application topically as directed. Qd to bid aa rash on hands and feet until clear, then prn flares 60 g 11   DULoxetine (CYMBALTA) 60 MG capsule Take 1 capsule (60 mg total) by mouth daily. 90 capsule 0   estrogens, conjugated, (PREMARIN) 0.625 MG tablet Take 1 tablet (0.625 mg total) by mouth daily. Take daily for 21 days then do not take for 7 days. 90 tablet 3   loratadine (CLARITIN) 10 MG tablet TAKE ONE TABLET BY MOUTH EVERY MORNING 90 tablet 0   Magnesium 500 MG CAPS Take 500 mg by mouth in the morning.     meloxicam (MOBIC) 15 MG tablet Take 1 tablet (15 mg total) by mouth daily. 90 tablet 1   omeprazole (PRILOSEC) 20 MG capsule Take 1 capsule (20 mg total) by mouth every morning. 90 capsule 3   Oxymetazoline HCl (RHOFADE) 1 % CREA Qam to face 30 g 11   pregabalin (LYRICA) 100 MG capsule Take 100 mg by mouth 3 (three) times daily.     tiZANidine (ZANAFLEX) 2 MG tablet TAKE ONE TABLET by MOUTH THREE times daily 30 tablet  0   vitamin B-12 (CYANOCOBALAMIN) 500 MCG tablet Take 250 mcg by mouth every morning.     ADVAIR DISKUS 250-50 MCG/ACT AEPB INHALE 1 PUFF BY MOUTH INTO LUNGS TWICE DAILY 180 each 0   ondansetron (ZOFRAN) 8 MG tablet Take 1 tablet (8 mg total) by mouth every 8 (eight) hours as needed for nausea or vomiting. (Patient not taking: Reported on 02/19/2022) 20 tablet 0   Ruxolitinib Phosphate (OPZELURA) 1.5 % CREA Apply 1 application topically as directed. Qd to bid aa rash on hands and feet until clear, then prn flares 60 g 11   venetoclax (VENCLEXTA) 10 & 50 &  100 MG Starter Pack Take by mouth daily. Take 20 mg for 7 days, then 50 mg daily x 7d, then 100 mg daily x 7d, then 200 mg daily x 7d. Take with food & water. (Patient not taking: Reported on 02/19/2022) 42 tablet 0   No current facility-administered medications for this visit.    OBJECTIVE: Vitals:   02/19/22 0955  BP: 106/75  Pulse: 91  Resp: 18  Temp: (!) 96.2 F (35.7 C)  SpO2: 96%      Body mass index is 27.58 kg/m.    ECOG FS:0 - Asymptomatic  General: Well-developed, well-nourished, no acute distress. Eyes: Pink conjunctiva, anicteric sclera. HEENT: Normocephalic, moist mucous membranes.  Minimally palpable lymphadenopathy. Lungs: No audible wheezing or coughing. Heart: Regular rate and rhythm. Abdomen: Soft, nontender, no obvious distention. Musculoskeletal: No edema, cyanosis, or clubbing. Neuro: Alert, answering all questions appropriately. Cranial nerves grossly intact. Skin: No rashes or petechiae noted. Psych: Normal affect.   LAB RESULTS:  Lab Results  Component Value Date   NA 139 02/19/2022   K 4.3 02/19/2022   CL 104 02/19/2022   CO2 26 02/19/2022   GLUCOSE 107 (H) 02/19/2022   BUN 23 (H) 02/19/2022   CREATININE 0.86 02/19/2022   CALCIUM 8.9 02/19/2022   PROT 6.7 02/19/2022   ALBUMIN 3.7 02/19/2022   AST 26 02/19/2022   ALT 24 02/19/2022   ALKPHOS 152 (H) 02/19/2022   BILITOT 0.3 02/19/2022    GFRNONAA >60 02/19/2022   GFRAA 79 12/06/2020    Lab Results  Component Value Date   WBC 30.8 (H) 02/19/2022   NEUTROABS 4.6 02/19/2022   HGB 15.5 (H) 02/19/2022   HCT 45.5 02/19/2022   MCV 91.7 02/19/2022   PLT 131 (L) 02/19/2022     STUDIES: CT CHEST ABDOMEN PELVIS W CONTRAST  Result Date: 01/22/2022 CLINICAL DATA:  History of CLL, follow-up. * Tracking Code: BO * EXAM: CT CHEST, ABDOMEN, AND PELVIS WITH CONTRAST TECHNIQUE: Multidetector CT imaging of the chest, abdomen and pelvis was performed following the standard protocol during bolus administration of intravenous contrast. RADIATION DOSE REDUCTION: This exam was performed according to the departmental dose-optimization program which includes automated exposure control, adjustment of the mA and/or kV according to patient size and/or use of iterative reconstruction technique. CONTRAST:  15m OMNIPAQUE IOHEXOL 300 MG/ML  SOLN COMPARISON:  Multiple priors including most recent CT May 22, 2021 FINDINGS: CT CHEST FINDINGS Cardiovascular: Aortic atherosclerosis without aneurysmal dilation. No central pulmonary embolus on this nondedicated study. Normal size heart. No significant pericardial effusion/thickening. Mediastinum/Nodes: Interval progression of the bilateral axillary and subpectoral adenopathy with the indexed left axillary lymph nodes now measuring 22 x 11 mm on image 21/2 previously 18 x 10 mm. A right subpectoral lymph node measures 17 x 11 mm on image 16/2 previously 13 x 7 mm. Prominent mediastinal lymph nodes are similar to prior with a precarinal lymph node measuring 17 x 9 mm on image 24/2, unchanged. Esophagus is grossly unremarkable. Lungs/Pleura: Mild biapical pleuroparenchymal scarring. Mild apical predominant centrilobular emphysema. No new suspicious pulmonary nodules or masses. Previously index pulmonary nodules are as follows. -right middle lobe pulmonary nodule adjacent to the is now centrally cavitary and measures 4  mm on image 70/7 unchanged in size from prior. -unchanged size of the 5 mm right lower middle lobe pulmonary nodule image 73/7 in the 4 mm right middle lobe pulmonary nodule on image 79/7. No pleural effusion.  No pneumothorax. Musculoskeletal: No aggressive lytic or blastic lesion  of bone. CT ABDOMEN PELVIS FINDINGS Hepatobiliary: No suspicious hepatic lesion. Gallbladder is unremarkable. No biliary ductal dilation. Pancreas: No pancreatic ductal dilation or evidence of acute inflammation. Spleen: No splenomegaly or focal splenic lesion. Adrenals/Urinary Tract: Bilateral adrenal glands appear normal. No hydronephrosis. Kidneys demonstrate symmetric enhancement and excretion of contrast material. Stomach/Bowel: Radiopaque enteric contrast material traverses the hepatic flexure. Stomach is unremarkable for degree of distension. No pathologic dilation of small or large bowel. The appendix and terminal ileum appear normal. Moderate volume of formed stool throughout the colon suggestive of constipation. No evidence of acute bowel inflammation. Vascular/Lymphatic: Interval progression in the abdominopelvic adenopathy. Previously index lymph nodes are as follows -left periaortic lymph node measures 3.4 x 1.5 cm on image 84/2 previously 2.7 x 1.4 cm. -left external iliac chain lymph node measures 2.5 x 1.0 cm on image 118/2 previously 1.8 x 0.8 cm -right pelvic sidewall lymph node now measures 3.0 x 1.2 cm on image 114/2 previously 2.3 x 0.9 cm normal caliber abdominal aorta . Reproductive: Status post hysterectomy. No adnexal masses. Other: No significant abdominopelvic free fluid. Musculoskeletal: No aggressive lytic or blastic lesion of bone. Hemi-sacralization of the right L5 vertebral body. No acute osseous abnormality. IMPRESSION: 1. Interval progression of adenopathy above and below the diaphragm. 2. No splenomegaly and no convincing evidence of solid organ or osseous lymphomatous involvement. 3. Unchanged small  right-sided pulmonary nodules. 4. Moderate volume of formed stool throughout the colon suggestive of constipation. 5. Aortic Atherosclerosis (ICD10-I70.0) and Emphysema (ICD10-J43.9). Electronically Signed   By: Dahlia Bailiff M.D.   On: 01/22/2022 14:59   SLEEP STUDY DOCUMENTS  Result Date: 01/22/2022 Ordered by an unspecified provider.  SLEEP STUDY DOCUMENTS  Result Date: 01/22/2022 Ordered by an unspecified provider.   ASSESSMENT: CLL,  iron deficiency.  PLAN:    1. CLL: Confirmed by peripheral blood flow cytometry.  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 Patient completed cycle 3 of Rituxan plus Treanda on March 26, 2018, but was noted to have progressive disease.  She was then initiated on 480 mg Imbruvica in November 2019.,  But now appears to have progression of disease with increasing white blood cell count as well as CT scan on Jan 22, 2022 revealing increased lymphadenopathy consistent with progression of disease.  Attempted patient on venetoclax, but she could not tolerate it secondary to persistent diarrhea and transaminitis.  Her symptoms have now resolved and we will try Revlimid for 21 days with 7-day break along with Rituxan every 4 weeks.  If we do not get a significant response, can consider rechallenging with venetoclax.  Return to clinic in approximately 1 week for further evaluation and consideration of cycle 1.  Appreciate clinical pharmacy input. 2.  Leukocytosis: Secondary to CLL.  Change treatment to Rituxan and Revlimid as above. 3.  Trigeminal neuralgia: Patient does not complain of this today.  Appears to be unrelated to CLL or Imbruvica.  MRI of the brain on Jan 25, 2019 was unremarkable.   4.  Depression/anxiety: Chronic.  Continue follow-up and treatment per primary care. 5.  Neuropathic pain/peripheral neuropathy: Chronic and unchanged.  Patient reports she is now on disability.  Continue  current follow-up and treatment as per neurology. 6.  Iron deficiency: Resolved.  Patient received 1 dose of IV Feraheme on October 21, 2019.  Patient now has a slight polycythemia.    Patient expressed understanding and was in agreement  with this plan. She also understands that She can call clinic at any time with any questions, concerns, or complaints.    Lloyd Huger, MD   02/19/2022 1:40 PM

## 2022-02-19 ENCOUNTER — Inpatient Hospital Stay: Payer: Medicare Other | Attending: Oncology

## 2022-02-19 ENCOUNTER — Inpatient Hospital Stay (HOSPITAL_BASED_OUTPATIENT_CLINIC_OR_DEPARTMENT_OTHER): Payer: Medicare Other | Admitting: Oncology

## 2022-02-19 ENCOUNTER — Other Ambulatory Visit (HOSPITAL_COMMUNITY): Payer: Self-pay

## 2022-02-19 ENCOUNTER — Other Ambulatory Visit: Payer: Medicaid Other

## 2022-02-19 ENCOUNTER — Other Ambulatory Visit: Payer: Self-pay

## 2022-02-19 ENCOUNTER — Telehealth: Payer: Medicaid Other | Admitting: Pharmacist

## 2022-02-19 ENCOUNTER — Encounter: Payer: Self-pay | Admitting: Oncology

## 2022-02-19 VITALS — BP 106/75 | HR 91 | Temp 96.2°F | Resp 18 | Wt 176.1 lb

## 2022-02-19 DIAGNOSIS — Z5112 Encounter for antineoplastic immunotherapy: Secondary | ICD-10-CM | POA: Diagnosis present

## 2022-02-19 DIAGNOSIS — C9110 Chronic lymphocytic leukemia of B-cell type not having achieved remission: Secondary | ICD-10-CM

## 2022-02-19 DIAGNOSIS — Z79899 Other long term (current) drug therapy: Secondary | ICD-10-CM | POA: Insufficient documentation

## 2022-02-19 LAB — CBC WITH DIFFERENTIAL/PLATELET
Abs Immature Granulocytes: 0 10*3/uL (ref 0.00–0.07)
Basophils Absolute: 0 10*3/uL (ref 0.0–0.1)
Basophils Relative: 0 %
Eosinophils Absolute: 0.3 10*3/uL (ref 0.0–0.5)
Eosinophils Relative: 1 %
HCT: 45.5 % (ref 36.0–46.0)
Hemoglobin: 15.5 g/dL — ABNORMAL HIGH (ref 12.0–15.0)
Lymphocytes Relative: 81 %
Lymphs Abs: 24.9 10*3/uL — ABNORMAL HIGH (ref 0.7–4.0)
MCH: 31.3 pg (ref 26.0–34.0)
MCHC: 34.1 g/dL (ref 30.0–36.0)
MCV: 91.7 fL (ref 80.0–100.0)
Monocytes Absolute: 0.9 10*3/uL (ref 0.1–1.0)
Monocytes Relative: 3 %
Neutro Abs: 4.6 10*3/uL (ref 1.7–7.7)
Neutrophils Relative %: 15 %
Platelets: 131 10*3/uL — ABNORMAL LOW (ref 150–400)
RBC: 4.96 MIL/uL (ref 3.87–5.11)
RDW: 12.9 % (ref 11.5–15.5)
Smear Review: NORMAL
WBC: 30.8 10*3/uL — ABNORMAL HIGH (ref 4.0–10.5)
nRBC: 0 % (ref 0.0–0.2)

## 2022-02-19 LAB — COMPREHENSIVE METABOLIC PANEL
ALT: 24 U/L (ref 0–44)
AST: 26 U/L (ref 15–41)
Albumin: 3.7 g/dL (ref 3.5–5.0)
Alkaline Phosphatase: 152 U/L — ABNORMAL HIGH (ref 38–126)
Anion gap: 9 (ref 5–15)
BUN: 23 mg/dL — ABNORMAL HIGH (ref 6–20)
CO2: 26 mmol/L (ref 22–32)
Calcium: 8.9 mg/dL (ref 8.9–10.3)
Chloride: 104 mmol/L (ref 98–111)
Creatinine, Ser: 0.86 mg/dL (ref 0.44–1.00)
GFR, Estimated: >60 mL/min (ref >60)
Glucose, Bld: 107 mg/dL — ABNORMAL HIGH (ref 70–99)
Potassium: 4.3 mmol/L (ref 3.5–5.1)
Sodium: 139 mmol/L (ref 135–145)
Total Bilirubin: 0.3 mg/dL (ref 0.3–1.2)
Total Protein: 6.7 g/dL (ref 6.5–8.1)

## 2022-02-19 NOTE — Progress Notes (Signed)
DISCONTINUE ON PATHWAY REGIMEN - Lymphoma and CLL  No Medical Intervention - Off Treatment.  REASON: Disease Progression PRIOR TREATMENT: [id number]: Referral to Lymphoma Specialist  START OFF PATHWAY REGIMEN - Lymphoma and CLL   [id number]:Rituximab IV/SUBQ D1 q7 Days:   Cycle 1: A cycle is 7 days:     Rituximab-xxxx    Cycles 2 and beyond: A cycle is every 7 days:     Rituximab and hyaluronidase human   **Always confirm dose/schedule in your pharmacy ordering system**  Patient Characteristics: Chronic Lymphocytic Leukemia (CLL), Treatment Indicated, Third Line Disease Type: Chronic Lymphocytic Leukemia (CLL) Disease Type: Not Applicable Disease Type: Not Applicable Treatment Indicated<= Treatment Indicated Line of Therapy: Third Line Intent of Therapy: Non-Curative / Palliative Intent, Discussed with Patient

## 2022-02-19 NOTE — Progress Notes (Signed)
DISCONTINUE ON PATHWAY REGIMEN - Lymphoma and CLL     Ramp-up cycle = 35 days:     Venetoclax      Venetoclax      Venetoclax      Venetoclax      Venetoclax    Cycle 1 through 24 = every 28 days:     Venetoclax      Rituximab-xxxx      Rituximab-xxxx   **Always confirm dose/schedule in your pharmacy ordering system**  REASON: Toxicities / Adverse Event PRIOR TREATMENT: [id number]: Venetoclax 400 mg Daily for up to 2 Years (Plus Initial 5-Week Dose Ramp-Up) + Rituximab 375/500 mg/m2 IV q28 Days x 6 Cycles TREATMENT RESPONSE: Unable to Evaluate  Lymphoma and CLL - No Medical Intervention - Off Treatment.  Patient Characteristics: Chronic Lymphocytic Leukemia (CLL), Treatment Indicated, Third Line Disease Type: Chronic Lymphocytic Leukemia (CLL) Disease Type: Not Applicable Disease Type: Not Applicable Treatment Indicated<= Treatment Indicated Line of Therapy: Third [employer]

## 2022-02-19 NOTE — Progress Notes (Signed)
  Pt states that she has been adding tea bags to her water; and has noticed that urine has some odor to it this week.  Fractured her tail bone and at times gets sharp pain vagnal and rectally. Will like a referral to GI.

## 2022-02-20 ENCOUNTER — Telehealth: Payer: Self-pay | Admitting: Pharmacist

## 2022-02-20 ENCOUNTER — Other Ambulatory Visit: Payer: Self-pay | Admitting: Family Medicine

## 2022-02-20 ENCOUNTER — Telehealth: Payer: Self-pay | Admitting: Pharmacy Technician

## 2022-02-20 ENCOUNTER — Encounter: Payer: Self-pay | Admitting: Oncology

## 2022-02-20 ENCOUNTER — Other Ambulatory Visit (HOSPITAL_COMMUNITY): Payer: Self-pay

## 2022-02-20 DIAGNOSIS — C9110 Chronic lymphocytic leukemia of B-cell type not having achieved remission: Secondary | ICD-10-CM

## 2022-02-20 MED ORDER — ALLOPURINOL 300 MG PO TABS: 300.0000 mg | ORAL_TABLET | Freq: Every day | ORAL | 0 refills | Status: DC

## 2022-02-20 MED ORDER — LENALIDOMIDE 10 MG PO CAPS: 10.0000 mg | ORAL_CAPSULE | Freq: Every day | ORAL | 0 refills | Status: DC

## 2022-02-20 NOTE — Telephone Encounter (Signed)
Oral Chemotherapy Pharmacist Encounter   Called patient to let her know her  Revlimid prescription was sent to [facility]. It was approved with a $0 copay.  She knows the plan is to start her Revlimid on 03/08/22.  Prescription of allopurinol was sent to her local pharmacy. She was instructed to begin the allopurinol on Monday 02/25/22.   [name], PharmD, BCPS, BCOP, CPP Hematology/Oncology Clinical Pharmacist [place]/DB/AP [facility] [phone]  02/20/2022 2:36 PM

## 2022-02-20 NOTE — Telephone Encounter (Signed)
Requested medication (s) are due for refill today: Due 02/22/22  Requested medication (s) are on the active medication list: yes    Last refill: 01/22/22 Amount not specified  Future visit scheduled no  Notes to clinic:Not delegated, please review. Thank you.  Requested Prescriptions  Pending Prescriptions Disp Refills   ARIPiprazole (ABILIFY) 2 MG tablet [Pharmacy Med Name: aripiprazole 2 mg tablet] 30 tablet 0    Sig: TAKE ONE TABLET BY MOUTH EVERY MORNING     Not Delegated - Psychiatry:  Antipsychotics - Second Generation (Atypical) - aripiprazole Failed - 02/20/2022  3:46 PM      Failed - This refill cannot be delegated      Failed - TSH in normal range and within 360 days    TSH  Date Value Ref Range Status  09/12/2016 1.97 mIU/L Final    Comment:      Reference Range   > or = 20 Years  0.40-4.50   Pregnancy Range First trimester  0.26-2.66 Second trimester 0.55-2.73 Third trimester  0.43-2.91            Failed - Lipid Panel in normal range within the last 12 months    Cholesterol  Date Value Ref Range Status  10/29/2021 151 <200 mg/dL Final   LDL Cholesterol (Calc)  Date Value Ref Range Status  10/29/2021 100 (H) mg/dL (calc) Final    Comment:    Reference range: <100 . Desirable range <100 mg/dL for primary prevention;   <70 mg/dL for patients with CHD or diabetic patients  with > or = 2 CHD risk factors. Marland Kitchen LDL-C is now calculated using the Martin-Hopkins  calculation, which is a validated novel method providing  better accuracy than the Friedewald equation in the  estimation of LDL-C.  Cresenciano Genre et al. Annamaria Helling. 9211;941(74): 2061-2068  (http://education.[employer].com/faq/FAQ164)    HDL  Date Value Ref Range Status  10/29/2021 33 (L) > OR = 50 mg/dL Final   Triglycerides  Date Value Ref Range Status  10/29/2021 86 <150 mg/dL Final         Failed - CBC within normal limits and completed in the last 12 months    WBC  Date Value Ref Range  Status  02/19/2022 30.8 (H) 4.0 - 10.5 K/uL Final   RBC  Date Value Ref Range Status  02/19/2022 4.96 3.87 - 5.11 MIL/uL Final   Hemoglobin  Date Value Ref Range Status  02/19/2022 15.5 (H) 12.0 - 15.0 g/dL Final   HCT  Date Value Ref Range Status  02/19/2022 45.5 36.0 - 46.0 % Final   MCHC  Date Value Ref Range Status  02/19/2022 34.1 30.0 - 36.0 g/dL Final   [facility]  Date Value Ref Range Status  02/19/2022 31.3 26.0 - 34.0 pg Final   MCV  Date Value Ref Range Status  02/19/2022 91.7 80.0 - 100.0 fL Final   No results found for: PLTCOUNTKUC, LABPLAT, POCPLA RDW  Date Value Ref Range Status  02/19/2022 12.9 11.5 - 15.5 % Final         Failed - CMP within normal limits and completed in the last 12 months    Albumin  Date Value Ref Range Status  02/19/2022 3.7 3.5 - 5.0 g/dL Final  05/13/2018 4.1 3.5 - 5.5 g/dL Final   Alkaline Phosphatase  Date Value Ref Range Status  02/19/2022 152 (H) 38 - 126 U/L Final   Alkaline phosphatase (APISO)  Date Value Ref Range Status  12/06/2020 64 37 - 153  U/L Final   ALT  Date Value Ref Range Status  02/19/2022 24 0 - 44 U/L Final   AST  Date Value Ref Range Status  02/19/2022 26 15 - 41 U/L Final   BUN  Date Value Ref Range Status  02/19/2022 23 (H) 6 - 20 mg/dL Final  05/13/2018 15 6 - 24 mg/dL Final   Calcium  Date Value Ref Range Status  02/19/2022 8.9 8.9 - 10.3 mg/dL Final   CO2  Date Value Ref Range Status  02/19/2022 26 22 - 32 mmol/L Final   Creat  Date Value Ref Range Status  12/06/2020 0.96 0.50 - 1.05 mg/dL Final    Comment:    For patients >59 years of age, the reference limit for Creatinine is approximately 13% higher for people identified as African-American. .    Creatinine, Ser  Date Value Ref Range Status  02/19/2022 0.86 0.44 - 1.00 mg/dL Final   Glucose, Bld  Date Value Ref Range Status  02/19/2022 107 (H) 70 - 99 mg/dL Final    Comment:    Glucose reference range applies only to  samples taken after fasting for at least 8 hours.   Potassium  Date Value Ref Range Status  02/19/2022 4.3 3.5 - 5.1 mmol/L Final   Sodium  Date Value Ref Range Status  02/19/2022 139 135 - 145 mmol/L Final  05/13/2018 138 134 - 144 mmol/L Final   Total Bilirubin  Date Value Ref Range Status  02/19/2022 0.3 0.3 - 1.2 mg/dL Final   Bilirubin Total  Date Value Ref Range Status  05/13/2018 0.4 0.0 - 1.2 mg/dL Final   Protein, UA  Date Value Ref Range Status  10/27/2018 Positive (A) Negative Final    Comment:    30    Total Protein  Date Value Ref Range Status  02/19/2022 6.7 6.5 - 8.1 g/dL Final  05/13/2018 6.3 6.0 - 8.5 g/dL Final   GFR, Est African American  Date Value Ref Range Status  12/06/2020 79 > OR = 60 mL/min/1.89m Final   GFR, Est Non African American  Date Value Ref Range Status  12/06/2020 68 > OR = 60 mL/min/1.759mFinal   GFR, Estimated  Date Value Ref Range Status  02/19/2022 >60 >60 mL/min Final    Comment:    (NOTE) Calculated using the CKD-EPI Creatinine Equation (2021)          Passed - Completed PHQ-2 or PHQ-9 in the last 360 days      Passed - Last BP in normal range    BP Readings from Last 1 Encounters:  02/19/22 106/75         Passed - Last Heart Rate in normal range    Pulse Readings from Last 1 Encounters:  02/19/22 91         Passed - Valid encounter within last 6 months    Recent Outpatient Visits           [facility] month [facility], [facility]   2 months ago Closed fracture of coccyx with routine healing, [facility]   3 months ago [facility]   6 months [facility]   8 months ago [facility]show for appointment   [facility]       Future  Appointments             In 4 weeks Ancil Boozer, Drue Stager, MD [facility] [place]   In 2 months [name], MD [place]   In 4 months [name], MD [place]

## 2022-02-20 NOTE — Telephone Encounter (Signed)
Oral Oncology Pharmacist Encounter  Received new prescription for Revlimid (lenalidomide) for the treatment of progressive CLL in conjunction with rituximab, planned duration until disease progression or unacceptable drug toxicity.  Regimen study: Laverda Sorenson, et al. Phase II study of lenalidomide and rituximab as salvage therapy for patients with relapsed or refractory chronic lymphocytic leukemia. J Clin Oncol. 2013;31(5):584-591. doi:10.1200/JCO.2012.42.8623  CMP from 02/19/22 assessed, no relevant lab abnormalities. Prescription dose and frequency assessed.   Current medication list in Epic reviewed, no DDIs with lenalidomide identified.   Evaluated chart and no patient barriers to medication adherence identified.   Prescription has been e-scribed to the [facility] for benefits analysis and approval.  Oral Oncology Clinic will continue to follow for insurance authorization, copayment issues, initial counseling and start date.  Patient agreed to treatment on 02/19/22 per MD documentation.  [name], PharmD, BCPS, BCOP, CPP Hematology/Oncology Clinical Pharmacist Practitioner [place]/DB/AP [facility] [phone]  02/20/2022 9:39 AM

## 2022-02-20 NOTE — Telephone Encounter (Signed)
Oral Oncology Patient Advocate Encounter   Received notification from [employer] that prior authorization for Lenalidomide is required.   PA submitted on CoverMyMeds Key [id number]  Status is pending   Oral Oncology Clinic will continue to follow.  [place] Patient [place] Phone [phone] Fax [phone] 02/20/2022 3:56 PM

## 2022-02-20 NOTE — Telephone Encounter (Signed)
Oral Oncology Patient Advocate Encounter  Prior Authorization for Lenalidomide has been approved.    PA# [id number] Effective dates: 02/20/22 through 09/15/22  Patients co-pay is $0.00.  Rx sent to [facility] pharmacy.  Oral Oncology Clinic will continue to follow.   [place] Patient [place] Phone [phone] Fax [phone] 02/20/2022 3:57 PM

## 2022-02-26 ENCOUNTER — Other Ambulatory Visit: Payer: Medicaid Other

## 2022-02-26 ENCOUNTER — Ambulatory Visit: Payer: Medicaid Other | Admitting: Oncology

## 2022-02-26 ENCOUNTER — Telehealth: Payer: Medicaid Other | Admitting: Pharmacist

## 2022-02-26 NOTE — Progress Notes (Signed)
Monique [name]  [phone] [phone]) [phone]  ID: Monique [name] OB: 12-17-1970  MR#: [id number]  XTG#:[id number]  Patient Care Team: Monique Sizer, MD as PCP - General (Family Medicine) Monique Huger, MD as Consulting Physician (Oncology) Monique [name], [facility] as Pharmacist (Pharmacist) Monique Cutter, LCSW as [place] Management (Licensed Clinical Social Worker)  CHIEF COMPLAINT: CLL, iron deficiency.  INTERVAL HISTORY: Patient returns to clinic today for further evaluation and initiation of cycle 1 of Rituxan and daily Revlimid.  She currently feels well and is asymptomatic. She denies any fevers or night sweats.  She has a good appetite and denies weight loss.  She has no chest pain, shortness of breath, cough, or hemoptysis.  She denies any nausea, vomiting, constipation, or diarrhea.  She has no melena or hematochezia.  She has no urinary complaints.  Patient offers no specific complaints today.  REVIEW OF SYSTEMS:   Review of Systems  Constitutional:  Negative for diaphoresis, fever, malaise/fatigue and weight loss.  HENT: Negative.  Negative for congestion.   Respiratory: Negative.  Negative for cough and shortness of breath.   Cardiovascular: Negative.  Negative for chest pain and leg swelling.  Gastrointestinal:  Negative for abdominal pain and constipation.  Genitourinary: Negative.  Negative for dysuria and frequency.  Musculoskeletal: Negative.  Negative for back pain, myalgias and neck pain.  Skin: Negative.   Neurological: Negative.  Negative for tingling, sensory change, focal weakness and weakness.  Psychiatric/Behavioral: Negative.  Negative for depression and suicidal ideas. The patient is not nervous/anxious.     As per HPI. Otherwise, a complete review of systems is negative.  PAST MEDICAL HISTORY: Past Medical History:  Diagnosis Date   Anxiety    Bursitis    leg pain   Carpal tunnel syndrome     Cervical dysplasia    hx LEEP over 18 years ago.    Chronic lymphocytic leukemia ([place]) 2018   Dr Grayland Ormond.  (in lymph nodes)   COPD (chronic obstructive pulmonary disease) ([place])    COVID-19 2021   Depression    Eating disorder    GERD (gastroesophageal reflux disease)    History of self-harm    Insomnia    Obsession    Tobacco abuse    Vitamin B12 deficiency (non anemic)     PAST SURGICAL HISTORY: Past Surgical History:  Procedure Laterality Date   BREAST BIOPSY Right 01/05/2018   US guided biopsy of 2 areas and 1 lymph node, MIXED INFLAMMATION AND GIANT CELL REACTION   CERVICAL BIOPSY  W/ LOOP ELECTRODE EXCISION     COLONOSCOPY WITH PROPOFOL N/A 04/21/2019   Procedure: COLONOSCOPY WITH PROPOFOL;  Surgeon: Virgel Manifold, MD;  Location: [facility] ENDOSCOPY;  Service: Gastroenterology;  Laterality: N/A;   OTHER SURGICAL HISTORY     scar tissue removed from vocal cords   TUBAL LIGATION     VAGINAL HYSTERECTOMY N/A 01/22/2021   Procedure: HYSTERECTOMY VAGINAL; BILATERAL SALPINGECTOMY;  Surgeon: Rubie Maid, MD;  Location: [facility] ORS;  Service: Gynecology;  Laterality: N/A;   vocal cord surgery  2005    FAMILY HISTORY: Family History  Problem Relation Age of Onset   Depression Mother    Cancer Mother        thyroid   Alcohol abuse Father    Alcohol abuse Brother    Depression Brother    Bipolar disorder Brother    Suicidality Brother    ADD / ADHD Son    Breast cancer  Neg Hx     ADVANCED DIRECTIVES (Y/N):  N  HEALTH MAINTENANCE: Social History   Tobacco Use   Smoking status: Every Day    Packs/day: 1.00    Years: 31.00    Total pack years: 31.00    Types: Cigarettes    Start date: 09/25/1986   Smokeless tobacco: Never  Vaping Use   Vaping Use: Former  Substance Use Topics   Alcohol use: Not Currently    Alcohol/week: 0.0 standard drinks of alcohol    Comment: rarely   Drug use: Yes    Frequency: 7.0 times per week    Types: Marijuana      Colonoscopy:  PAP:  Bone density:  Lipid panel:  No Known Allergies  Current Outpatient Medications  Medication Sig Dispense Refill   ADVAIR DISKUS 250-50 MCG/ACT AEPB INHALE 1 PUFF BY MOUTH INTO LUNGS TWICE DAILY 180 each 0   ARIPiprazole (ABILIFY) 5 MG tablet Take 1 tablet (5 mg total) by mouth daily. 90 tablet 0   Ascorbic Acid (VITAMIN C) 1000 MG tablet Take 1,000 mg by mouth daily as needed (immune health).     atorvastatin (LIPITOR) 10 MG tablet Take 1 tablet (10 mg total) by mouth in the morning. 90 tablet 0   benzonatate (TESSALON) 100 MG capsule Take 1 capsule (100 mg total) by mouth 2 (two) times daily as needed for cough. 20 capsule 0   Calcium Carbonate-Vit D-Min (GNP CALCIUM 1200) 1200-1000 MG-UNIT CHEW Chew 1,200 mg by mouth daily with breakfast. Take in combination with vitamin D and magnesium. (Patient taking differently: Chew 2 tablets by mouth in the morning, at noon, and at bedtime. Take in combination with vitamin D and magnesium.) 90 tablet 3   estrogens, conjugated, (PREMARIN) 0.625 MG tablet Take 1 tablet (0.625 mg total) by mouth daily. Take daily for 21 days then do not take for 7 days. 90 tablet 3   loratadine (CLARITIN) 10 MG tablet TAKE ONE TABLET BY MOUTH EVERY MORNING 90 tablet 0   Magnesium 500 MG CAPS Take 500 mg by mouth in the morning.     meloxicam (MOBIC) 15 MG tablet Take 1 tablet (15 mg total) by mouth daily. 90 tablet 1   omeprazole (PRILOSEC) 20 MG capsule Take 1 capsule (20 mg total) by mouth every morning. 90 capsule 3   Oxymetazoline HCl (RHOFADE) 1 % CREA Qam to face 30 g 11   pregabalin (LYRICA) 100 MG capsule Take 100 mg by mouth 3 (three) times daily.     tiZANidine (ZANAFLEX) 2 MG tablet TAKE ONE TABLET by MOUTH THREE times daily 30 tablet 0   vitamin B-12 (CYANOCOBALAMIN) 500 MCG tablet Take 250 mcg by mouth every morning.     allopurinol (ZYLOPRIM) 300 MG tablet Take 1 tablet (300 mg total) by mouth daily. (Patient not taking: Reported  on 02/28/2022) 21 tablet 0   Crisaborole (EUCRISA) 2 % OINT Apply 1 application topically as directed. Qd to bid aa rash on hands and feet until clear, then prn flares (Patient not taking: Reported on 02/28/2022) 60 g 11   DULoxetine (CYMBALTA) 60 MG capsule Take 1 capsule (60 mg total) by mouth daily. (Patient not taking: Reported on 02/28/2022) 90 capsule 0   lenalidomide (REVLIMID) 10 MG capsule Take 1 capsule (10 mg total) by mouth daily. (Patient not taking: Reported on 02/28/2022) 28 capsule 0   ondansetron (ZOFRAN) 8 MG tablet Take 1 tablet (8 mg total) by mouth every 8 (eight) hours as needed  for nausea or vomiting. (Patient not taking: Reported on 02/19/2022) 20 tablet 0   Ruxolitinib Phosphate (OPZELURA) 1.5 % CREA Apply 1 application topically as directed. Qd to bid aa rash on hands and feet until clear, then prn flares (Patient not taking: Reported on 02/28/2022) 60 g 11   No current facility-administered medications for this visit.   Facility-Administered Medications Ordered in Other Visits  Medication Dose Route Frequency Provider Last Rate Last Admin   riTUXimab-pvvr (RUXIENCE) 700 mg in sodium chloride 0.9 % 250 mL (2.1875 mg/mL) infusion  375 mg/m2 (Treatment Plan Recorded) Intravenous Once Monique Huger, MD        OBJECTIVE: Vitals:   02/28/22 0839  BP: (!) 114/51  Pulse: 88  Resp: 16  Temp: (!) 96.9 F (36.1 C)  SpO2: 98%      Body mass index is 27.72 kg/m.    ECOG FS:0 - Asymptomatic  General: Well-developed, well-nourished, no acute distress. Eyes: Pink conjunctiva, anicteric sclera. HEENT: Normocephalic, moist mucous membranes.  Minimally palpable lymphadenopathy. Lungs: No audible wheezing or coughing. Heart: Regular rate and rhythm. Abdomen: Soft, nontender, no obvious distention. Musculoskeletal: No edema, cyanosis, or clubbing. Neuro: Alert, answering all questions appropriately. Cranial nerves grossly intact. Skin: No rashes or petechiae noted. Psych:  Normal affect.  LAB RESULTS:  Lab Results  Component Value Date   NA 139 02/28/2022   K 4.3 02/28/2022   CL 105 02/28/2022   CO2 27 02/28/2022   GLUCOSE 117 (H) 02/28/2022   BUN 20 02/28/2022   CREATININE 0.98 02/28/2022   CALCIUM 8.8 (L) 02/28/2022   PROT 6.5 02/28/2022   ALBUMIN 3.6 02/28/2022   AST 24 02/28/2022   ALT 21 02/28/2022   ALKPHOS 101 02/28/2022   BILITOT 0.7 02/28/2022   GFRNONAA >60 02/28/2022   GFRAA 79 12/06/2020    Lab Results  Component Value Date   WBC 27.4 (H) 02/28/2022   NEUTROABS 6.3 02/28/2022   HGB 14.3 02/28/2022   HCT 42.2 02/28/2022   MCV 93.2 02/28/2022   PLT 153 02/28/2022     STUDIES: No results found.  ASSESSMENT: CLL,  iron deficiency.  PLAN:    1. CLL: Confirmed by peripheral blood flow cytometry.  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 Patient completed cycle 3 of Rituxan plus Treanda on March 26, 2018, but was noted to have progressive disease.  She was then initiated on 480 mg Imbruvica in November 2019, but now appears to have progression of disease with increasing white blood cell count as well as CT scan on Jan 22, 2022 revealing increased lymphadenopathy consistent with progression of disease.  Attempted patient on venetoclax, but she could not tolerate it secondary to persistent diarrhea and transaminitis.  Her symptoms have now resolved.  Proceed with cycle 1 of 4 of weekly Rituxan today.  Patient will also take 10 mg Revlimid daily.  If we do not get a significant response, can consider rechallenging with venetoclax.  Return to clinic in 1 week for further evaluation and consideration of cycle 2.  Appreciate clinical pharmacy input. 2.  Leukocytosis: Secondary to CLL.  Treatment as above.   3.  Trigeminal neuralgia: Patient does not complain of this today.  Appears to be unrelated to CLL or Imbruvica.  MRI of the brain on Jan 25, 2019 was  unremarkable.   4.  Depression/anxiety: Chronic.  Continue follow-up and treatment per primary care. 5.  Neuropathic pain/peripheral neuropathy: Chronic and unchanged.  Patient reports she is now on disability.  Continue current follow-up and treatment as per neurology. 6.  Iron deficiency: Resolved.      Patient expressed understanding and was in agreement with this plan. She also understands that She can call clinic at any time with any questions, concerns, or complaints.    Monique Huger, MD   02/28/2022 10:24 AM

## 2022-02-28 ENCOUNTER — Inpatient Hospital Stay: Payer: Medicare Other | Admitting: Pharmacist

## 2022-02-28 ENCOUNTER — Inpatient Hospital Stay: Payer: Medicare Other

## 2022-02-28 ENCOUNTER — Other Ambulatory Visit: Payer: Self-pay

## 2022-02-28 ENCOUNTER — Inpatient Hospital Stay (HOSPITAL_BASED_OUTPATIENT_CLINIC_OR_DEPARTMENT_OTHER): Payer: Medicare Other | Admitting: Oncology

## 2022-02-28 ENCOUNTER — Encounter: Payer: Self-pay | Admitting: Oncology

## 2022-02-28 VITALS — BP 98/75 | HR 78 | Temp 98.2°F | Resp 16

## 2022-02-28 VITALS — BP 114/51 | HR 88 | Temp 96.9°F | Resp 16 | Ht 67.0 in | Wt 177.0 lb

## 2022-02-28 DIAGNOSIS — C9110 Chronic lymphocytic leukemia of B-cell type not having achieved remission: Secondary | ICD-10-CM

## 2022-02-28 DIAGNOSIS — Z5112 Encounter for antineoplastic immunotherapy: Secondary | ICD-10-CM | POA: Diagnosis not present

## 2022-02-28 LAB — COMPREHENSIVE METABOLIC PANEL
ALT: 21 U/L (ref 0–44)
AST: 24 U/L (ref 15–41)
Albumin: 3.6 g/dL (ref 3.5–5.0)
Alkaline Phosphatase: 101 U/L (ref 38–126)
Anion gap: 7 (ref 5–15)
BUN: 20 mg/dL (ref 6–20)
CO2: 27 mmol/L (ref 22–32)
Calcium: 8.8 mg/dL — ABNORMAL LOW (ref 8.9–10.3)
Chloride: 105 mmol/L (ref 98–111)
Creatinine, Ser: 0.98 mg/dL (ref 0.44–1.00)
GFR, Estimated: >60 mL/min (ref >60)
Glucose, Bld: 117 mg/dL — ABNORMAL HIGH (ref 70–99)
Potassium: 4.3 mmol/L (ref 3.5–5.1)
Sodium: 139 mmol/L (ref 135–145)
Total Bilirubin: 0.7 mg/dL (ref 0.3–1.2)
Total Protein: 6.5 g/dL (ref 6.5–8.1)

## 2022-02-28 LAB — CBC WITH DIFFERENTIAL/PLATELET
Abs Immature Granulocytes: 0 10*3/uL (ref 0.00–0.07)
Basophils Absolute: 0.5 10*3/uL — ABNORMAL HIGH (ref 0.0–0.1)
Basophils Relative: 2 %
Eosinophils Absolute: 1.1 10*3/uL — ABNORMAL HIGH (ref 0.0–0.5)
Eosinophils Relative: 4 %
HCT: 42.2 % (ref 36.0–46.0)
Hemoglobin: 14.3 g/dL (ref 12.0–15.0)
Lymphocytes Relative: 68 %
Lymphs Abs: 18.6 10*3/uL — ABNORMAL HIGH (ref 0.7–4.0)
MCH: 31.6 pg (ref 26.0–34.0)
MCHC: 33.9 g/dL (ref 30.0–36.0)
MCV: 93.2 fL (ref 80.0–100.0)
Monocytes Absolute: 0.8 10*3/uL (ref 0.1–1.0)
Monocytes Relative: 3 %
Neutro Abs: 6.3 10*3/uL (ref 1.7–7.7)
Neutrophils Relative %: 23 %
Platelets: 153 10*3/uL (ref 150–400)
RBC: 4.53 MIL/uL (ref 3.87–5.11)
RDW: 13.2 % (ref 11.5–15.5)
Smear Review: NORMAL
WBC: 27.4 10*3/uL — ABNORMAL HIGH (ref 4.0–10.5)
nRBC: 0 % (ref 0.0–0.2)

## 2022-02-28 MED ORDER — METHYLPREDNISOLONE SODIUM SUCC 125 MG IJ SOLR
125.0000 mg | Freq: Once | INTRAMUSCULAR | Status: AC | PRN
  Administered 2022-02-28: 125 mg via INTRAVENOUS

## 2022-02-28 MED ORDER — DIPHENHYDRAMINE HCL 50 MG/ML IJ SOLN
50.0000 mg | Freq: Once | INTRAMUSCULAR | Status: AC | PRN
  Administered 2022-02-28: 25 mg via INTRAVENOUS

## 2022-02-28 MED ORDER — SODIUM CHLORIDE 0.9 % IV SOLN: 8.0000 mg | Freq: Once | INTRAVENOUS | Status: DC

## 2022-02-28 MED ORDER — DIPHENHYDRAMINE HCL 25 MG PO CAPS
25.0000 mg | ORAL_CAPSULE | Freq: Once | ORAL | Status: AC
  Administered 2022-02-28: 25 mg via ORAL
  Filled 2022-02-28: qty 1

## 2022-02-28 MED ORDER — SODIUM CHLORIDE 0.9 % IV SOLN
375.0000 mg/m2 | Freq: Once | INTRAVENOUS | Status: AC
  Administered 2022-02-28: 700 mg via INTRAVENOUS
  Filled 2022-02-28: qty 20

## 2022-02-28 MED ORDER — FAMOTIDINE IN NACL 20-0.9 MG/50ML-% IV SOLN
20.0000 mg | Freq: Once | INTRAVENOUS | Status: AC | PRN
  Administered 2022-02-28: 20 mg via INTRAVENOUS

## 2022-02-28 MED ORDER — ONDANSETRON HCL 4 MG/2ML IJ SOLN
8.0000 mg | Freq: Once | INTRAMUSCULAR | Status: AC
  Administered 2022-02-28: 8 mg via INTRAVENOUS

## 2022-02-28 MED ORDER — ACETAMINOPHEN 325 MG PO TABS
650.0000 mg | ORAL_TABLET | Freq: Once | ORAL | Status: AC
  Administered 2022-02-28: 650 mg via ORAL
  Filled 2022-02-28: qty 2

## 2022-02-28 MED ORDER — SODIUM CHLORIDE 0.9 % IV SOLN
Freq: Once | INTRAVENOUS | Status: DC | PRN
  Filled 2022-02-28: qty 250

## 2022-02-28 MED ORDER — SODIUM CHLORIDE 0.9 % IV SOLN
Freq: Once | INTRAVENOUS | Status: AC
  Filled 2022-02-28: qty 250

## 2022-02-28 NOTE — Progress Notes (Deleted)
Hypersensitivity Reaction note  Date of event: 02/28/22 Time of event: 1108 Generic name of drug involved: [place] Name of provider notified of the hypersensitivity reaction: Beckey Rutter, NP Was agent that likely caused hypersensitivity reaction added to Allergies List within EMR? yes Chain of events including reaction signs/symptoms, treatment administered, and outcome (e.g., drug resumed; drug discontinued; sent to Emergency Department; etc.)  After starting Ruxience per protocol, at 1108, patient began to complain of chest heaviness that moved to her abdomen. Complaints of nausea noted as well. Ruxience stopped immediately and NS bolus started. At 1109, 25 mg of Benadryl was given.  At 1110, 125 mg of Solumedrol administered. Vital signs stable. Lauren NP notified and came to chairside at 1111.   IV Pepcid given at 1119.  At 1115, patient began vomiting and 8 mg IV push of Zofran was given as ordered per Ander Purpura, NP. At 1123,  patient stated, "Im feeling better now. I am not having chest heaviness anymore." Per Lauren NP give patient 500 mls of NS bolus and let patient rest for now. At 1159, vital signs stable, and NS bolus completed. Patient feeling better with no complaints.  At 1245, Lauren NP at chairside to reassess patient. Per Ander Purpura, NP okay to restart Ruxience at lowest rate of 22.9 ml/hr.  Gaynell Face Toyia Jelinek, RN 02/28/2022 11:35 AM

## 2022-02-28 NOTE — Progress Notes (Signed)
At 1108, after starting Ruxience per protocol, patient began to complain of chest heaviness that transitioned to abdominal pain with nausea. No shortness of breath, itching or rash noted. Ruxience stopped immediately and NS bolus started.  At 1109, 25 mg of Benadryl was given.  At 1110, 125 mg of Solumedrol administered. Vital signs stable. Lauren NP, notified and came to chairside at 1111.   IV Pepcid given at 1119.  At 1115, patient began vomiting and 8 mg IV push of Zofran was given as ordered per Ander Purpura, NP.   At 1123,  patient stated, "I'm feeling better now. I am not having chest heaviness or stomach pain anymore." Vital signs remains stable. Per Lauren NP,  give patient 500 ml NS bolus and observe patient for any more symptoms. At 1159, vital signs stable, and NS bolus completed. Patient feeling better with no complaints.  At 1245, Lauren NP at chairside to reassess patient. Per Ander Purpura, NP okay to restart Ruxience at '50mg'$ /hr and titrate per protocol. Per Lauren NP this is not a hypersensitivity or allergic reaction to Ruxience. Recommended that for the remainder of Ruxience treatments patient should receive a steroid and antiemetics as pre meds. Also continue to administer at initial rate of '50mg'$ /hr and if no reactions,  increase in '50mg'$ /hr increments every 30 minutes for remainder of treatments.   Patient tolerated and completed Ruxience without any other issues. Vital signs stable at discharge.

## 2022-02-28 NOTE — Patient Instructions (Addendum)
[facility] AT [place]  Discharge Instructions: Thank you for choosing [facility] to provide your oncology and hematology care.  If you have a lab appointment with the [facility], please go directly to the [facility] and check in at the registration area.  Wear comfortable clothing and clothing appropriate for easy access to any Portacath or PICC line.   We strive to give you quality time with your provider. You may need to reschedule your appointment if you arrive late (15 or more minutes).  Arriving late affects you and other patients whose appointments are after yours.  Also, if you miss three or more appointments without notifying the office, you may be dismissed from the clinic at the provider's discretion.      For prescription refill requests, have your pharmacy contact our office and allow 72 hours for refills to be completed.    Today you received the following chemotherapy and/or immunotherapy agents Ruxience      To help prevent nausea and vomiting after your treatment, we encourage you to take your nausea medication as directed.  BELOW ARE SYMPTOMS THAT SHOULD BE REPORTED IMMEDIATELY: *FEVER GREATER THAN 100.4 F (38 C) OR HIGHER *CHILLS OR SWEATING *NAUSEA AND VOMITING THAT IS NOT CONTROLLED WITH YOUR NAUSEA MEDICATION *UNUSUAL SHORTNESS OF BREATH *UNUSUAL BRUISING OR BLEEDING *URINARY PROBLEMS (pain or burning when urinating, or frequent urination) *BOWEL PROBLEMS (unusual diarrhea, constipation, pain near the anus) TENDERNESS IN MOUTH AND THROAT WITH OR WITHOUT PRESENCE OF ULCERS (sore throat, sores in mouth, or a toothache) UNUSUAL RASH, SWELLING OR PAIN  UNUSUAL VAGINAL DISCHARGE OR ITCHING   Items with * indicate a potential emergency and should be followed up as soon as possible or go to the Emergency Department if any problems should occur.  Please show the CHEMOTHERAPY ALERT CARD or IMMUNOTHERAPY ALERT CARD at check-in to  the Emergency Department and triage nurse.  Should you have questions after your visit or need to cancel or reschedule your appointment, please contact [facility] CANCER [place] AT [place]  [phone] and follow the prompts.  Office hours are 8:00 a.m. to 4:30 p.m. Monday - Friday. Please note that voicemails left after 4:00 p.m. may not be returned until the following business day.  We are closed weekends and major holidays. You have access to a nurse at all times for urgent questions. Please call the main number to the clinic [phone] and follow the prompts.  For any non-urgent questions, you may also contact your provider using MyChart. We now offer e-Visits for anyone 78 and older to request care online for non-urgent symptoms. For details visit [email or web address].   Also download the MyChart app! Go to the app store, search "MyChart", open the app, select [facility], and log in with your MyChart username and password.  Masks are optional in the cancer centers. If you would like for your care team to wear a mask while they are taking care of you, please let them know. For doctor visits, patients may have with them one support person who is at least 53 years old. At this time, visitors are not allowed in the infusion area.  Rituximab Injection What is this medication? RITUXIMAB (ri TUX i mab) is a monoclonal antibody. It is used to treat certain types of cancer like non-Hodgkin lymphoma and chronic lymphocytic leukemia. It is also used to treat rheumatoid arthritis, granulomatosis with polyangiitis, microscopic polyangiitis, and pemphigus vulgaris. This medicine may be used for other purposes; ask  your health care provider or pharmacist if you have questions. COMMON BRAND NAME(S): RIABNI, Rituxan, RUXIENCE, truxima What should I tell my care team before I take this medication? They need to know if you have any of these conditions: chest pain heart disease infection  especially a viral infection such as chickenpox, cold sores, hepatitis B, or herpes immune system problems irregular heartbeat or rhythm kidney disease low blood counts (white cells, platelets, or red cells) lung disease recent or upcoming vaccine an unusual or allergic reaction to rituximab, other medicines, foods, dyes, or preservatives pregnant or trying to get pregnant breast-feeding How should I use this medication? This medicine is injected into a vein. It is given by a health care provider in a hospital or clinic setting. A special MedGuide will be given to you before each treatment. Be sure to read this information carefully each time. Talk to your health care provider about the use of this medicine in children. While this drug may be prescribed for children as young as 6 months for selected conditions, precautions do apply. Overdosage: If you think you have taken too much of this medicine contact a poison control center or emergency room at once. NOTE: This medicine is only for you. Do not share this medicine with others. What if I miss a dose? Keep appointments for follow-up doses. It is important not to miss your dose. Call your health care provider if you are unable to keep an appointment. What may interact with this medication? Do not take this medicine with any of the following medicines: live vaccines This medicine may also interact with the following medicines: cisplatin This list may not describe all possible interactions. Give your health care provider a list of all the medicines, herbs, non-prescription drugs, or dietary supplements you use. Also tell them if you smoke, drink alcohol, or use illegal drugs. Some items may interact with your medicine. What should I watch for while using this medication? Your condition will be monitored carefully while you are receiving this medicine. You may need blood work done while you are taking this medicine. This medicine can cause  serious infusion reactions. To reduce the risk your health care provider may give you other medicines to take before receiving this one. Be sure to follow the directions from your health care provider. This medicine may increase your risk of getting an infection. Call your health care provider for advice if you get a fever, chills, sore throat, or other symptoms of a cold or flu. Do not treat yourself. Try to avoid being around people who are sick. Call your health care provider if you are around anyone with measles, chickenpox, or if you develop sores or blisters that do not heal properly. Avoid taking medicines that contain aspirin, acetaminophen, ibuprofen, naproxen, or ketoprofen unless instructed by your health care provider. These medicines may hide a fever. This medicine may cause serious skin reactions. They can happen weeks to months after starting the medicine. Contact your health care provider right away if you notice fevers or flu-like symptoms with a rash. The rash may be red or purple and then turn into blisters or peeling of the skin. Or, you might notice a red rash with swelling of the face, lips or lymph nodes in your neck or under your arms. In some patients, this medicine may cause a serious brain infection that may cause death. If you have any problems seeing, thinking, speaking, walking, or standing, tell your healthcare professional right away. If you  cannot reach your healthcare professional, urgently seek other source of medical care. Do not become pregnant while taking this medicine or for at least 12 months after stopping it. Women should inform their health care provider if they wish to become pregnant or think they might be pregnant. There is potential for serious harm to an unborn child. Talk to your health care provider for more information. Women should use a reliable form of birth control while taking this medicine and for 12 months after stopping it. Do not breast-feed while  taking this medicine or for at least 6 months after stopping it. What side effects may I notice from receiving this medication? Side effects that you should report to your health care provider as soon as possible: allergic reactions (skin rash, itching or hives; swelling of the face, lips, or tongue) diarrhea edema (sudden weight gain; swelling of the ankles, feet, hands or other unusual swelling; trouble breathing) fast, irregular heartbeat heart attack (trouble breathing; pain or tightness in the chest, neck, back or arms; unusually weak or tired) infection (fever, chills, cough, sore throat, pain or trouble passing urine) kidney injury (trouble passing urine or change in the amount of urine) liver injury (dark yellow or brown urine; general ill feeling or flu-like symptoms; loss of appetite, right upper belly pain; unusually weak or tired, yellowing of the eyes or skin) low blood pressure (dizziness; feeling faint or lightheaded, falls; unusually weak or tired) low red blood cell counts (trouble breathing; feeling faint; lightheaded, falls; unusually weak or tired) mouth sores redness, blistering, peeling, or loosening of the skin, including inside the mouth stomach pain unusual bruising or bleeding wheezing (trouble breathing with loud or whistling sounds) vomiting Side effects that usually do not require medical attention (report to your health care provider if they continue or are bothersome): headache joint pain muscle cramps, pain nausea This list may not describe all possible side effects. Call your doctor for medical advice about side effects. You may report side effects to FDA at 1-800-FDA-1088. Where should I keep my medication? This medicine is given in a hospital or clinic. It will not be stored at home. NOTE: This sheet is a summary. It may not cover all possible information. If you have questions about this medicine, talk to your doctor, pharmacist, or health care  provider.  2023 Elsevier/Gold Standard (2020-09-04 00:00:00)

## 2022-02-28 NOTE — Progress Notes (Signed)
[facility]  Telephone:([phone] [phone]  Patient Care Team: [name], MD as PCP - General (Family Medicine) Lloyd Huger, MD as Consulting Physician (Oncology) Lane Hacker, [facility] as Pharmacist (Pharmacist) Alyce Pagan as [facility] [place] Management (Licensed Clinical Social Worker)   Name of the patient: Monique [name]  [id number]  01-16-1971   Date of visit: 02/28/22  HPI: Patient is a 51 y.o. female with progressive CLL, previously treated with ibrutinib. Patient was intolerant to venetoclax treatment initiation and did not want to retry initiating venetoclax. The plan is to now treat her with rituximab and lenalidomide.  Patient to start rituximab and lenalidomide regimen today  Reason for Consult: Lenalidomide oral chemotherapy education.   PAST MEDICAL HISTORY: Past Medical History:  Diagnosis Date   Anxiety    Bursitis    leg pain   Carpal tunnel syndrome    Cervical dysplasia    hx LEEP over 18 years ago.    Chronic lymphocytic leukemia ([facility]) 2018   Dr Grayland Ormond.  (in lymph nodes)   COPD (chronic obstructive pulmonary disease) ([place])    COVID-19 2021   Depression    Eating disorder    GERD (gastroesophageal reflux disease)    History of self-harm    Insomnia    Obsession    Tobacco abuse    Vitamin B12 deficiency (non anemic)     HEMATOLOGY/ONCOLOGY HISTORY:  Oncology History Overview Note  Patient with recent CLL diagnosis by flow cytometry.  Previously not being treated in observation.   S/p 4 cycles of weekly Rituxan completing on 02/20/2017 scans from January 2018 revealed minimal progression.   Developed right breast mass in March 2019.  Had diagnostic mammogram performed on 12/26/2017 showed suspicious right breast mass.  Biopsy revealed granulomatous mastitis.  She was treated with doxycycline 100 mg twice daily for 7 days.   Developed worsening adenopathy.  CT  chest/abdomen/pelvis and soft tissue neck from May 2019 revealed progressive bulky adenopathy in the chest, abdomen and pelvis compatible with worsening disease.   [name] recommended beginning treatment with Rituxan and Treanda.  Received first cycle last week.  S/p 6 cycles of Rituxan and Treanda.  Tolerating well.  Last dose given 03/25/18.   CLL (chronic lymphocytic leukemia) ([place])  11/24/2016 Initial Diagnosis   CLL (chronic lymphocytic leukemia) ([place])   01/21/2018 - 03/26/2018 Chemotherapy   The patient had palonosetron (ALOXI) injection 0.25 mg, 0.25 mg, Intravenous,  Once, 4 of 5 cycles Administration: 0.25 mg (01/28/2018), 0.25 mg (02/25/2018), 0.25 mg (03/25/2018) riTUXimab (RITUXAN) 700 mg in sodium chloride 0.9 % 250 mL (2.1875 mg/mL) infusion, 375 mg/m2 = 700 mg, Intravenous,  Once, 4 of 5 cycles Administration: 700 mg (01/28/2018) bendamustine (BENDEKA) 150 mg in sodium chloride 0.9 % 50 mL (2.6786 mg/mL) chemo infusion, 90 mg/m2 = 150 mg, Intravenous,  Once, 4 of 5 cycles Administration: 150 mg (01/28/2018), 150 mg (01/29/2018), 150 mg (02/25/2018), 150 mg (02/26/2018), 150 mg (03/25/2018), 150 mg (03/26/2018)  for chemotherapy treatment.    02/28/2022 -  Chemotherapy   Patient is on Treatment Plan : NON-HODGKINS LYMPHOMA Rituximab q7d     03/12/2022 - 03/12/2022 Chemotherapy   Patient is on Treatment Plan : LYMPHOMA CLL/SLL Venetoclax ramp up / Venetoclax + Rituximab (375/500) q28d x 6 cycles / Venetoclax q28d x 18 cycles       ALLERGIES:  has no active allergies.  MEDICATIONS:  Current Outpatient Medications  Medication Sig Dispense  Refill   ADVAIR DISKUS 250-50 MCG/ACT AEPB INHALE 1 PUFF BY MOUTH INTO LUNGS TWICE DAILY 180 each 0   allopurinol (ZYLOPRIM) 300 MG tablet Take 1 tablet (300 mg total) by mouth daily. (Patient not taking: Reported on 02/28/2022) 21 tablet 0   ARIPiprazole (ABILIFY) 5 MG tablet Take 1 tablet (5 mg total) by mouth daily. 90 tablet 0   Ascorbic Acid  (VITAMIN C) 1000 MG tablet Take 1,000 mg by mouth daily as needed (immune health).     atorvastatin (LIPITOR) 10 MG tablet Take 1 tablet (10 mg total) by mouth in the morning. 90 tablet 0   benzonatate (TESSALON) 100 MG capsule Take 1 capsule (100 mg total) by mouth 2 (two) times daily as needed for cough. 20 capsule 0   Calcium Carbonate-Vit D-Min (GNP CALCIUM 1200) 1200-1000 MG-UNIT CHEW Chew 1,200 mg by mouth daily with breakfast. Take in combination with vitamin D and magnesium. (Patient taking differently: Chew 2 tablets by mouth in the morning, at noon, and at bedtime. Take in combination with vitamin D and magnesium.) 90 tablet 3   Crisaborole (EUCRISA) 2 % OINT Apply 1 application topically as directed. Qd to bid aa rash on hands and feet until clear, then prn flares (Patient not taking: Reported on 02/28/2022) 60 g 11   DULoxetine (CYMBALTA) 60 MG capsule Take 1 capsule (60 mg total) by mouth daily. (Patient not taking: Reported on 02/28/2022) 90 capsule 0   estrogens, conjugated, (PREMARIN) 0.625 MG tablet Take 1 tablet (0.625 mg total) by mouth daily. Take daily for 21 days then do not take for 7 days. 90 tablet 3   lenalidomide (REVLIMID) 10 MG capsule Take 1 capsule (10 mg total) by mouth daily. (Patient not taking: Reported on 02/28/2022) 28 capsule 0   loratadine (CLARITIN) 10 MG tablet TAKE ONE TABLET BY MOUTH EVERY MORNING 90 tablet 0   Magnesium 500 MG CAPS Take 500 mg by mouth in the morning.     meloxicam (MOBIC) 15 MG tablet Take 1 tablet (15 mg total) by mouth daily. 90 tablet 1   omeprazole (PRILOSEC) 20 MG capsule Take 1 capsule (20 mg total) by mouth every morning. 90 capsule 3   ondansetron (ZOFRAN) 8 MG tablet Take 1 tablet (8 mg total) by mouth every 8 (eight) hours as needed for nausea or vomiting. (Patient not taking: Reported on 02/19/2022) 20 tablet 0   Oxymetazoline HCl (RHOFADE) 1 % CREA Qam to face 30 g 11   pregabalin (LYRICA) 100 MG capsule Take 100 mg by mouth 3 (three)  times daily.     Ruxolitinib Phosphate (OPZELURA) 1.5 % CREA Apply 1 application topically as directed. Qd to bid aa rash on hands and feet until clear, then prn flares (Patient not taking: Reported on 02/28/2022) 60 g 11   tiZANidine (ZANAFLEX) 2 MG tablet TAKE ONE TABLET by MOUTH THREE times daily 30 tablet 0   vitamin B-12 (CYANOCOBALAMIN) 500 MCG tablet Take 250 mcg by mouth every morning.     No current facility-administered medications for this visit.   Facility-Administered Medications Ordered in Other Visits  Medication Dose Route Frequency Provider Last Rate Last Admin   0.9 %  sodium chloride infusion   Intravenous Once PRN Lloyd Huger, MD 999 mL/hr at 02/28/22 1109 New Bag at 02/28/22 1109    VITAL SIGNS: LMP 08/17/2016  There were no vitals filed for this visit.  Estimated body mass index is 27.72 kg/m as calculated from the following:  Height as of an earlier encounter on 02/28/22: '5\' 7"'$  (1.702 m).   Weight as of an earlier encounter on 02/28/22: 80.3 kg (177 lb).  LABS: CBC:    Component Value Date/Time   WBC 27.4 (H) 02/28/2022 0814   HGB 14.3 02/28/2022 0814   HCT 42.2 02/28/2022 0814   PLT 153 02/28/2022 0814   MCV 93.2 02/28/2022 0814   NEUTROABS 6.3 02/28/2022 0814   LYMPHSABS 18.6 (H) 02/28/2022 0814   MONOABS 0.8 02/28/2022 0814   EOSABS 1.1 (H) 02/28/2022 0814   BASOSABS 0.5 (H) 02/28/2022 0814   Comprehensive Metabolic Panel:    Component Value Date/Time   NA 139 02/28/2022 0814   NA 138 05/13/2018 1413   K 4.3 02/28/2022 0814   CL 105 02/28/2022 0814   CO2 27 02/28/2022 0814   BUN 20 02/28/2022 0814   BUN 15 05/13/2018 1413   CREATININE 0.98 02/28/2022 0814   CREATININE 0.96 12/06/2020 1646   GLUCOSE 117 (H) 02/28/2022 0814   CALCIUM 8.8 (L) 02/28/2022 0814   AST 24 02/28/2022 0814   ALT 21 02/28/2022 0814   ALKPHOS 101 02/28/2022 0814   BILITOT 0.7 02/28/2022 0814   BILITOT 0.4 05/13/2018 1413   PROT 6.5 02/28/2022 0814   PROT 6.3  05/13/2018 1413   ALBUMIN 3.6 02/28/2022 0814   ALBUMIN 4.1 05/13/2018 1413     Present during today's visit: Patient only, seen in infusion  Start plan: Patient prefers to take her lenalidomide in the morning, so she will take her first dose on 03/01/22   Patient Education I spoke with patient for overview of new oral chemotherapy medication: lenalidomide   Administration: Counseled patient on administration, dosing, side effects, monitoring, drug-food interactions, safe handling, storage, and disposal. Patient will take 1 capsule (10 mg total) by mouth daily.  Patient was instructed to begin taking aspirin '81mg'$  daily.   Side Effects: Side effects include but not limited to: rash/itchy skin, N/V, fatigue, decreased wbc/hgb/plt, constipation or diarrhea.    Drug-drug Interactions (DDI): No current DDIs with lenalidomide  Adherence: After discussion with patient no patient barriers to medication adherence identified.  Reviewed with patient importance of keeping a medication schedule and plan for any missed doses.  Ms. Huser voiced understanding and appreciation. All questions answered. Medication handout provided.  Provided patient with [facility] phone number. Patient knows to call the office with questions or concerns. [facility] will continue to follow.  Patient expressed understanding and was in agreement with this plan. She also understands that She can call clinic at any time with any questions, concerns, or complaints.   Medication Access Issues: No issues, patient fills her medication at [facility]  Follow-up plan: RTC next week  Thank you for allowing me to participate in the care of this patient.   Time Total: 20 mins  Visit consisted of counseling and education on dealing with issues of symptom management in the setting of serious and potentially life-threatening illness.Greater than 50%  of this time was  spent counseling and coordinating care related to the above assessment and plan.  Signed by: [name], PharmD, BCPS, Salley Slaughter, CPP Hematology/Oncology Clinical Pharmacist Practitioner [place]/DB/AP [facility] [phone]  02/28/2022 4:14 PM

## 2022-03-01 NOTE — Progress Notes (Signed)
[place]  [phone] [phone]) [phone]  ID: Monique [name] OB: [birth year]  MR#: [id number]  VVK#:[id number]  Patient Care Team: [name], MD as PCP - General (Family Medicine) Lloyd Huger, MD as Consulting Physician (Oncology) Lane Hacker, [facility] as Pharmacist (Pharmacist) Greg Cutter, LCSW as [place] Management (Licensed Clinical Social Worker)  CHIEF COMPLAINT: CLL, iron deficiency.  INTERVAL HISTORY: Patient returns to clinic today for further evaluation and consideration of cycle 2 of weekly Rituxan along with her daily Revlimid.  She had a reaction to her Rituxan last week, but otherwise is tolerating her treatments well without significant side effects.  She denies any fevers or night sweats.  She has a good appetite and denies weight loss.  She has no chest pain, shortness of breath, cough, or hemoptysis.  She denies any nausea, vomiting, constipation, or diarrhea.  She has no melena or hematochezia.  She has no urinary complaints.  Patient offers no further specific complaints today.  REVIEW OF SYSTEMS:   Review of Systems  Constitutional:  Negative for diaphoresis, fever, malaise/fatigue and weight loss.  HENT: Negative.  Negative for congestion.   Respiratory: Negative.  Negative for cough and shortness of breath.   Cardiovascular: Negative.  Negative for chest pain and leg swelling.  Gastrointestinal:  Negative for abdominal pain and constipation.  Genitourinary: Negative.  Negative for dysuria and frequency.  Musculoskeletal: Negative.  Negative for back pain, myalgias and neck pain.  Skin: Negative.   Neurological: Negative.  Negative for tingling, sensory change, focal weakness and weakness.  Psychiatric/Behavioral: Negative.  Negative for depression and suicidal ideas. The patient is not nervous/anxious.     As per HPI. Otherwise, a complete review of systems is negative.  PAST MEDICAL  HISTORY: Past Medical History:  Diagnosis Date   Anxiety    Bursitis    leg pain   Carpal tunnel syndrome    Cervical dysplasia    hx LEEP over 18 years ago.    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  (in lymph nodes)   COPD (chronic obstructive pulmonary disease) ([place])    COVID-19 2021   Depression    Eating disorder    GERD (gastroesophageal reflux disease)    History of self-harm    Insomnia    Obsession    Tobacco abuse    Vitamin B12 deficiency (non anemic)     PAST SURGICAL HISTORY: Past Surgical History:  Procedure Laterality Date   BREAST BIOPSY Right 01/05/2018   US guided biopsy of 2 areas and 1 lymph node, MIXED INFLAMMATION AND GIANT CELL REACTION   CERVICAL BIOPSY  W/ LOOP ELECTRODE EXCISION     COLONOSCOPY WITH PROPOFOL N/A 04/21/2019   Procedure: COLONOSCOPY WITH PROPOFOL;  Surgeon: Virgel Manifold, MD;  Location: [facility] ENDOSCOPY;  Service: Gastroenterology;  Laterality: N/A;   OTHER SURGICAL HISTORY     scar tissue removed from vocal cords   TUBAL LIGATION     VAGINAL HYSTERECTOMY N/A 01/22/2021   Procedure: HYSTERECTOMY VAGINAL; BILATERAL SALPINGECTOMY;  Surgeon: Rubie Maid, MD;  Location: [facility] ORS;  Service: Gynecology;  Laterality: N/A;   vocal cord surgery  2005    FAMILY HISTORY: Family History  Problem Relation Age of Onset   Depression Mother    Cancer Mother        thyroid   Alcohol abuse Father    Alcohol abuse Brother    Depression Brother    Bipolar disorder  Brother    Suicidality Brother    ADD / ADHD Son    Breast cancer Neg Hx     ADVANCED DIRECTIVES (Y/N):  N  HEALTH MAINTENANCE: Social History   Tobacco Use   Smoking status: Every Day    Packs/day: 1.00    Years: 31.00    Total pack years: 31.00    Types: Cigarettes    Start date: 09/25/1986   Smokeless tobacco: Never  Vaping Use   Vaping Use: Former  Substance Use Topics   Alcohol use: Not Currently    Alcohol/week: 0.0 standard drinks of alcohol     Comment: rarely   Drug use: Yes    Frequency: 7.0 times per week    Types: Marijuana     Colonoscopy:  PAP:  Bone density:  Lipid panel:  No Active Allergies  Current Outpatient Medications  Medication Sig Dispense Refill   ADVAIR DISKUS 250-50 MCG/ACT AEPB INHALE 1 PUFF BY MOUTH INTO LUNGS TWICE DAILY 180 each 0   ARIPiprazole (ABILIFY) 5 MG tablet Take 1 tablet (5 mg total) by mouth daily. 90 tablet 0   Ascorbic Acid (VITAMIN C) 1000 MG tablet Take 1,000 mg by mouth daily as needed (immune health).     ASPIRIN 81 PO Take 81 mg by mouth daily.     atorvastatin (LIPITOR) 10 MG tablet Take 1 tablet (10 mg total) by mouth in the morning. 90 tablet 0   benzonatate (TESSALON) 100 MG capsule Take 1 capsule (100 mg total) by mouth 2 (two) times daily as needed for cough. 20 capsule 0   estrogens, conjugated, (PREMARIN) 0.625 MG tablet Take 1 tablet (0.625 mg total) by mouth daily. Take daily for 21 days then do not take for 7 days. 90 tablet 3   loratadine (CLARITIN) 10 MG tablet TAKE ONE TABLET BY MOUTH EVERY MORNING 90 tablet 0   Magnesium 500 MG CAPS Take 500 mg by mouth in the morning.     meloxicam (MOBIC) 15 MG tablet Take 1 tablet (15 mg total) by mouth daily. 90 tablet 1   omeprazole (PRILOSEC) 20 MG capsule Take 1 capsule (20 mg total) by mouth every morning. 90 capsule 3   Oxymetazoline HCl (RHOFADE) 1 % CREA Qam to face 30 g 11   pregabalin (LYRICA) 100 MG capsule Take 100 mg by mouth 3 (three) times daily.     tiZANidine (ZANAFLEX) 2 MG tablet TAKE ONE TABLET by MOUTH THREE times daily 30 tablet 0   vitamin B-12 (CYANOCOBALAMIN) 500 MCG tablet Take 250 mcg by mouth every morning.     allopurinol (ZYLOPRIM) 300 MG tablet Take 1 tablet (300 mg total) by mouth daily. (Patient not taking: Reported on 02/28/2022) 21 tablet 0   Calcium Carbonate-Vit D-Min (GNP CALCIUM 1200) 1200-1000 MG-UNIT CHEW Chew 1,200 mg by mouth daily with breakfast. Take in combination with vitamin D and  magnesium. (Patient taking differently: Chew 2 tablets by mouth in the morning, at noon, and at bedtime. Take in combination with vitamin D and magnesium.) 90 tablet 3   Crisaborole (EUCRISA) 2 % OINT Apply 1 application topically as directed. Qd to bid aa rash on hands and feet until clear, then prn flares (Patient not taking: Reported on 02/28/2022) 60 g 11   DULoxetine (CYMBALTA) 60 MG capsule Take 1 capsule (60 mg total) by mouth daily. (Patient not taking: Reported on 02/28/2022) 90 capsule 0   lenalidomide (REVLIMID) 10 MG capsule Take 1 capsule (10 mg total) by mouth  daily. (Patient not taking: Reported on 02/28/2022) 28 capsule 0   ondansetron (ZOFRAN) 8 MG tablet Take 1 tablet (8 mg total) by mouth every 8 (eight) hours as needed for nausea or vomiting. (Patient not taking: Reported on 02/19/2022) 20 tablet 0   Ruxolitinib Phosphate (OPZELURA) 1.5 % CREA Apply 1 application topically as directed. Qd to bid aa rash on hands and feet until clear, then prn flares (Patient not taking: Reported on 02/28/2022) 60 g 11   No current facility-administered medications for this visit.   Facility-Administered Medications Ordered in Other Visits  Medication Dose Route Frequency Provider Last Rate Last Admin   diphenhydrAMINE (BENADRYL) capsule 50 mg  50 mg Oral Once Lloyd Huger, MD        OBJECTIVE: Vitals:   03/07/22 0903 03/07/22 0904  BP:  (!) 130/101  Pulse:  79  Resp: 18   Temp: (!) 96.4 F (35.8 C)   SpO2:  100%      Body mass index is 28.04 kg/m.    ECOG FS:0 - Asymptomatic  General: Well-developed, well-nourished, no acute distress. Eyes: Pink conjunctiva, anicteric sclera. HEENT: Normocephalic, moist mucous membranes. Lungs: No audible wheezing or coughing. Heart: Regular rate and rhythm. Abdomen: Soft, nontender, no obvious distention. Musculoskeletal: No edema, cyanosis, or clubbing. Neuro: Alert, answering all questions appropriately. Cranial nerves grossly intact. Skin:  No rashes or petechiae noted. Psych: Normal affect.  LAB RESULTS:  Lab Results  Component Value Date   NA 139 03/07/2022   K 4.4 03/07/2022   CL 105 03/07/2022   CO2 24 03/07/2022   GLUCOSE 98 03/07/2022   BUN 18 03/07/2022   CREATININE 1.06 (H) 03/07/2022   CALCIUM 8.4 (L) 03/07/2022   PROT 6.3 (L) 03/07/2022   ALBUMIN 3.5 03/07/2022   AST 25 03/07/2022   ALT 21 03/07/2022   ALKPHOS 89 03/07/2022   BILITOT 0.8 03/07/2022   GFRNONAA >60 03/07/2022   GFRAA 79 12/06/2020    Lab Results  Component Value Date   WBC 10.3 03/07/2022   NEUTROABS 2.7 03/07/2022   HGB 13.7 03/07/2022   HCT 40.8 03/07/2022   MCV 93.4 03/07/2022   PLT 117 (L) 03/07/2022     STUDIES: No results found.  ASSESSMENT: CLL,  iron deficiency.  PLAN:    1. CLL: Confirmed by peripheral blood flow cytometry.  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 Patient completed cycle 3 of Rituxan plus Treanda on March 26, 2018, but was noted to have progressive disease.  She was then initiated on 480 mg Imbruvica in November 2019, but now appears to have progression of disease with increasing white blood cell count as well as CT scan on Jan 22, 2022 revealing increased lymphadenopathy consistent with progression of disease.  Attempted patient on venetoclax, but she could not tolerate it secondary to persistent diarrhea and transaminitis.  Her symptoms have now resolved.  Proceed with cycle 2 of 4 of weekly Rituxan today.  Continue 10 mg Revlimid daily.  If patient does not have a durable response, can consider rechallenging with venetoclax.  Return to clinic in 1 week for further evaluation and consideration of cycle 3. Appreciate clinical pharmacy input. 2.  Leukocytosis: Resolved.  Secondary to CLL.  Treatment as above.   3.  Trigeminal neuralgia: Patient does not complain of this today.  Appears to be unrelated to CLL or Imbruvica.  MRI of  the brain on  Jan 25, 2019 was unremarkable.   4.  Depression/anxiety: Chronic.  Continue follow-up and treatment per primary care. 5.  Neuropathic pain/peripheral neuropathy: Chronic and unchanged.  Patient reports she is now on disability.  Continue current follow-up and treatment as per neurology. 6.  Iron deficiency: Resolved.   7.  Rituxan reaction: Patient has been given additional premedications for preventative.  She did not have a reaction during cycle 2.    Patient expressed understanding and was in agreement with this plan. She also understands that She can call clinic at any time with any questions, concerns, or complaints.    Lloyd Huger, MD   03/07/2022 4:55 PM

## 2022-03-05 ENCOUNTER — Other Ambulatory Visit: Payer: Medicaid Other

## 2022-03-05 ENCOUNTER — Telehealth: Payer: Medicaid Other | Admitting: Pharmacist

## 2022-03-07 ENCOUNTER — Encounter: Payer: Self-pay | Admitting: Family Medicine

## 2022-03-07 ENCOUNTER — Inpatient Hospital Stay (HOSPITAL_BASED_OUTPATIENT_CLINIC_OR_DEPARTMENT_OTHER): Payer: Medicare Other | Admitting: Oncology

## 2022-03-07 ENCOUNTER — Inpatient Hospital Stay: Payer: Medicare Other | Admitting: Pharmacist

## 2022-03-07 ENCOUNTER — Inpatient Hospital Stay: Payer: Medicare Other

## 2022-03-07 VITALS — BP 96/67 | HR 77 | Temp 97.8°F | Resp 18

## 2022-03-07 VITALS — BP 130/101 | HR 79 | Temp 96.4°F | Resp 18 | Wt 179.0 lb

## 2022-03-07 DIAGNOSIS — C9110 Chronic lymphocytic leukemia of B-cell type not having achieved remission: Secondary | ICD-10-CM | POA: Diagnosis not present

## 2022-03-07 DIAGNOSIS — Z5112 Encounter for antineoplastic immunotherapy: Secondary | ICD-10-CM | POA: Diagnosis not present

## 2022-03-07 LAB — COMPREHENSIVE METABOLIC PANEL
ALT: 21 U/L (ref 0–44)
AST: 25 U/L (ref 15–41)
Albumin: 3.5 g/dL (ref 3.5–5.0)
Alkaline Phosphatase: 89 U/L (ref 38–126)
Anion gap: 10 (ref 5–15)
BUN: 18 mg/dL (ref 6–20)
CO2: 24 mmol/L (ref 22–32)
Calcium: 8.4 mg/dL — ABNORMAL LOW (ref 8.9–10.3)
Chloride: 105 mmol/L (ref 98–111)
Creatinine, Ser: 1.06 mg/dL — ABNORMAL HIGH (ref 0.44–1.00)
GFR, Estimated: >60 mL/min (ref >60)
Glucose, Bld: 98 mg/dL (ref 70–99)
Potassium: 4.4 mmol/L (ref 3.5–5.1)
Sodium: 139 mmol/L (ref 135–145)
Total Bilirubin: 0.8 mg/dL (ref 0.3–1.2)
Total Protein: 6.3 g/dL — ABNORMAL LOW (ref 6.5–8.1)

## 2022-03-07 LAB — CBC WITH DIFFERENTIAL/PLATELET
Abs Immature Granulocytes: 0 10*3/uL (ref 0.00–0.07)
Basophils Absolute: 0 10*3/uL (ref 0.0–0.1)
Basophils Relative: 0 %
Eosinophils Absolute: 0.3 10*3/uL (ref 0.0–0.5)
Eosinophils Relative: 3 %
HCT: 40.8 % (ref 36.0–46.0)
Hemoglobin: 13.7 g/dL (ref 12.0–15.0)
Lymphocytes Relative: 65 %
Lymphs Abs: 6.7 10*3/uL — ABNORMAL HIGH (ref 0.7–4.0)
MCH: 31.4 pg (ref 26.0–34.0)
MCHC: 33.6 g/dL (ref 30.0–36.0)
MCV: 93.4 fL (ref 80.0–100.0)
Monocytes Absolute: 0.6 10*3/uL (ref 0.1–1.0)
Monocytes Relative: 6 %
Neutro Abs: 2.7 10*3/uL (ref 1.7–7.7)
Neutrophils Relative %: 26 %
Platelets: 117 10*3/uL — ABNORMAL LOW (ref 150–400)
RBC: 4.37 MIL/uL (ref 3.87–5.11)
RDW: 13.6 % (ref 11.5–15.5)
Smear Review: NORMAL
WBC: 10.3 10*3/uL (ref 4.0–10.5)
nRBC: 0.2 % (ref 0.0–0.2)

## 2022-03-07 MED ORDER — DIPHENHYDRAMINE HCL 25 MG PO CAPS: 50.0000 mg | ORAL_CAPSULE | Freq: Once | ORAL | Status: DC

## 2022-03-07 MED ORDER — SODIUM CHLORIDE 0.9 % IV SOLN
375.0000 mg/m2 | Freq: Once | INTRAVENOUS | Status: AC
  Administered 2022-03-07: 700 mg via INTRAVENOUS
  Filled 2022-03-07: qty 50

## 2022-03-07 MED ORDER — FAMOTIDINE IN NACL 20-0.9 MG/50ML-% IV SOLN
20.0000 mg | Freq: Once | INTRAVENOUS | Status: AC
  Administered 2022-03-07: 20 mg via INTRAVENOUS
  Filled 2022-03-07: qty 50

## 2022-03-07 MED ORDER — SODIUM CHLORIDE 0.9 % IV SOLN
Freq: Once | INTRAVENOUS | Status: AC
  Filled 2022-03-07: qty 250

## 2022-03-07 MED ORDER — SODIUM CHLORIDE 0.9 % IV SOLN
20.0000 mg | Freq: Once | INTRAVENOUS | Status: AC
  Administered 2022-03-07: 20 mg via INTRAVENOUS
  Filled 2022-03-07: qty 2

## 2022-03-07 MED ORDER — DIPHENHYDRAMINE HCL 50 MG/ML IJ SOLN
50.0000 mg | Freq: Once | INTRAMUSCULAR | Status: AC
  Administered 2022-03-07: 50 mg via INTRAVENOUS
  Filled 2022-03-07: qty 1

## 2022-03-07 MED ORDER — ACETAMINOPHEN 325 MG PO TABS
650.0000 mg | ORAL_TABLET | Freq: Once | ORAL | Status: AC
  Administered 2022-03-07: 650 mg via ORAL
  Filled 2022-03-07: qty 2

## 2022-03-07 NOTE — Progress Notes (Signed)
Rapid Infusion Rituximab Pharmacist Evaluation  Monique [name] is a 51 y.o. 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 This patient may not be considered for RIR.   A pharmacist has verified the patient tolerated rituximab infusions per the [facility] standard infusion protocol without grade 3-4 infusion reactions. The treatment plan will be updated to reflect RIR if the patient qualifies per the checklist below:   Age > 32 years old Yes   Clinically significant cardiovascular disease No   Circulating lymphocyte count < 5000/uL prior to cycle two No  Lab Results  Component Value Date   LYMPHSABS 6.7 (H) 03/07/2022    Prior documented grade 3-4 infusion reaction to rituximab unknown  Prior documented grade 1-2 infusion reaction to rituximab (If YES, Pharmacist will confirm with Physician if patient is still a candidate for RIR) Yes   Previous rituximab infusion within the past 6 months Yes   Treatment Plan updated orders to reflect RIR No    Monique [name] does not meet the criteria for Rapid Infusion Rituximab. This patient is not going to be switched to rapid infusion rituximab.   Monique [name] 03/07/22 9:51 AM

## 2022-03-07 NOTE — Patient Instructions (Addendum)
[facility]  Discharge Instructions: Thank you for choosing [facility] to provide your oncology and hematology care.  If you have a lab appointment with the Cancer Center, please go directly to the Cancer Center and check in at the registration area.  Wear comfortable clothing and clothing appropriate for easy access to any Portacath or PICC line.   We strive to give you quality time with your provider. You may need to reschedule your appointment if you arrive late (15 or more minutes).  Arriving late affects you and other patients whose appointments are after yours.  Also, if you miss three or more appointments without notifying the office, you may be dismissed from the clinic at the provider's discretion.      For prescription refill requests, have your pharmacy contact our office and allow 72 hours for refills to be completed.    Today you received the following chemotherapy and/or immunotherapy agents: Ruxience      To help prevent nausea and vomiting after your treatment, we encourage you to take your nausea medication as directed.  BELOW ARE SYMPTOMS THAT SHOULD BE REPORTED IMMEDIATELY: *FEVER GREATER THAN 100.4 F (38 C) OR HIGHER *CHILLS OR SWEATING *NAUSEA AND VOMITING THAT IS NOT CONTROLLED WITH YOUR NAUSEA MEDICATION *UNUSUAL SHORTNESS OF BREATH *UNUSUAL BRUISING OR BLEEDING *URINARY PROBLEMS (pain or burning when urinating, or frequent urination) *BOWEL PROBLEMS (unusual diarrhea, constipation, pain near the anus) TENDERNESS IN MOUTH AND THROAT WITH OR WITHOUT PRESENCE OF ULCERS (sore throat, sores in mouth, or a toothache) UNUSUAL RASH, SWELLING OR PAIN  UNUSUAL VAGINAL DISCHARGE OR ITCHING   Items with * indicate a potential emergency and should be followed up as soon as possible or go to the Emergency Department if any problems should occur.  Please show the CHEMOTHERAPY ALERT CARD or IMMUNOTHERAPY ALERT CARD at check-in to  the Emergency Department and triage nurse.  Should you have questions after your visit or need to cancel or reschedule your appointment, please contact [facility]  [phone] and follow the prompts.  Office hours are 8:00 a.m. to 4:30 p.m. Monday - Friday. Please note that voicemails left after 4:00 p.m. may not be returned until the following business day.  We are closed weekends and major holidays. You have access to a nurse at all times for urgent questions. Please call the main number to the clinic [phone] and follow the prompts.  For any non-urgent questions, you may also contact your provider using MyChart. We now offer e-Visits for anyone 18 and older to request care online for non-urgent symptoms. For details visit [email or web address].   Also download the MyChart app! Go to the app store, search "MyChart", open the app, select [facility], and log in with your MyChart username and password.  Masks are optional in the cancer centers. If you would like for your care team to wear a mask while they are taking care of you, please let them know. For doctor visits, patients may have with them one support person who is at least 53 years old. At this time, visitors are not allowed in the infusion area.   

## 2022-03-07 NOTE — Progress Notes (Signed)
Patient denies any concerns today.  

## 2022-03-07 NOTE — Progress Notes (Signed)
[place]  Telephone:([phone] [phone]  Patient Care Team: [name], MD as PCP - General (Family Medicine) Lloyd Huger, MD as Consulting Physician (Oncology) Lane Hacker, [facility] as Pharmacist (Pharmacist) Alyce Pagan as [place] Management (Licensed Clinical Social Worker)   Name of the patient: Monique [name]  [id number]  Sep 20, 1970   Date of visit: 03/07/22  HPI: Patient is a 51 y.o. female with  progressive CLL, previously treated with ibrutinib. Patient was intolerant to venetoclax treatment initiation and did not want to retry initiating venetoclax. The plan is to now treat her with rituximab and lenalidomide. Patient started rituximab on 02/28/22 and lenalidomide 03/01/22.  Reason for Consult: Oral chemotherapy follow-up for lenalidomide therapy.   PAST MEDICAL HISTORY: Past Medical History:  Diagnosis Date   Anxiety    Bursitis    leg pain   Carpal tunnel syndrome    Cervical dysplasia    hx LEEP over 18 years ago.    Chronic lymphocytic leukemia ([place]) 2018   Dr Grayland Ormond.  (in lymph nodes)   COPD (chronic obstructive pulmonary disease) ([place])    COVID-19 2021   Depression    Eating disorder    GERD (gastroesophageal reflux disease)    History of self-harm    Insomnia    Obsession    Tobacco abuse    Vitamin B12 deficiency (non anemic)     HEMATOLOGY/ONCOLOGY HISTORY:  Oncology History Overview Note  Patient with recent CLL diagnosis by flow cytometry.  Previously not being treated in observation.   S/p 4 cycles of weekly Rituxan completing on 02/20/2017 scans from January 2018 revealed minimal progression.   Developed right breast mass in March 2019.  Had diagnostic mammogram performed on 12/26/2017 showed suspicious right breast mass.  Biopsy revealed granulomatous mastitis.  She was treated with doxycycline 100 mg twice daily for 7 days.   Developed worsening  adenopathy.  CT chest/abdomen/pelvis and soft tissue neck from May 2019 revealed progressive bulky adenopathy in the chest, abdomen and pelvis compatible with worsening disease.   [name] recommended beginning treatment with Rituxan and Treanda.  Received first cycle last week.  S/p 6 cycles of Rituxan and Treanda.  Tolerating well.  Last dose given 03/25/18.   CLL (chronic lymphocytic leukemia) ([place])  11/24/2016 Initial Diagnosis   CLL (chronic lymphocytic leukemia) ([place])   01/21/2018 - 03/26/2018 Chemotherapy   The patient had palonosetron (ALOXI) injection 0.25 mg, 0.25 mg, Intravenous,  Once, 4 of 5 cycles Administration: 0.25 mg (01/28/2018), 0.25 mg (02/25/2018), 0.25 mg (03/25/2018) riTUXimab (RITUXAN) 700 mg in sodium chloride 0.9 % 250 mL (2.1875 mg/mL) infusion, 375 mg/m2 = 700 mg, Intravenous,  Once, 4 of 5 cycles Administration: 700 mg (01/28/2018) bendamustine (BENDEKA) 150 mg in sodium chloride 0.9 % 50 mL (2.6786 mg/mL) chemo infusion, 90 mg/m2 = 150 mg, Intravenous,  Once, 4 of 5 cycles Administration: 150 mg (01/28/2018), 150 mg (01/29/2018), 150 mg (02/25/2018), 150 mg (02/26/2018), 150 mg (03/25/2018), 150 mg (03/26/2018)  for chemotherapy treatment.    02/28/2022 -  Chemotherapy   Patient is on Treatment Plan : NON-HODGKINS LYMPHOMA Rituximab q7d     03/12/2022 - 03/12/2022 Chemotherapy   Patient is on Treatment Plan : LYMPHOMA CLL/SLL Venetoclax ramp up / Venetoclax + Rituximab (375/500) q28d x 6 cycles / Venetoclax q28d x 18 cycles       ALLERGIES:  has no active allergies.  MEDICATIONS:  Current Outpatient Medications  Medication  Sig Dispense Refill   ADVAIR DISKUS 250-50 MCG/ACT AEPB INHALE 1 PUFF BY MOUTH INTO LUNGS TWICE DAILY 180 each 0   allopurinol (ZYLOPRIM) 300 MG tablet Take 1 tablet (300 mg total) by mouth daily. (Patient not taking: Reported on 02/28/2022) 21 tablet 0   ARIPiprazole (ABILIFY) 5 MG tablet Take 1 tablet (5 mg total) by mouth daily. 90 tablet 0    Ascorbic Acid (VITAMIN C) 1000 MG tablet Take 1,000 mg by mouth daily as needed (immune health).     ASPIRIN 81 PO Take 81 mg by mouth daily.     atorvastatin (LIPITOR) 10 MG tablet Take 1 tablet (10 mg total) by mouth in the morning. 90 tablet 0   benzonatate (TESSALON) 100 MG capsule Take 1 capsule (100 mg total) by mouth 2 (two) times daily as needed for cough. 20 capsule 0   Calcium Carbonate-Vit D-Min (GNP CALCIUM 1200) 1200-1000 MG-UNIT CHEW Chew 1,200 mg by mouth daily with breakfast. Take in combination with vitamin D and magnesium. (Patient taking differently: Chew 2 tablets by mouth in the morning, at noon, and at bedtime. Take in combination with vitamin D and magnesium.) 90 tablet 3   Crisaborole (EUCRISA) 2 % OINT Apply 1 application topically as directed. Qd to bid aa rash on hands and feet until clear, then prn flares (Patient not taking: Reported on 02/28/2022) 60 g 11   DULoxetine (CYMBALTA) 60 MG capsule Take 1 capsule (60 mg total) by mouth daily. (Patient not taking: Reported on 02/28/2022) 90 capsule 0   estrogens, conjugated, (PREMARIN) 0.625 MG tablet Take 1 tablet (0.625 mg total) by mouth daily. Take daily for 21 days then do not take for 7 days. 90 tablet 3   lenalidomide (REVLIMID) 10 MG capsule Take 1 capsule (10 mg total) by mouth daily. (Patient not taking: Reported on 02/28/2022) 28 capsule 0   loratadine (CLARITIN) 10 MG tablet TAKE ONE TABLET BY MOUTH EVERY MORNING 90 tablet 0   Magnesium 500 MG CAPS Take 500 mg by mouth in the morning.     meloxicam (MOBIC) 15 MG tablet Take 1 tablet (15 mg total) by mouth daily. 90 tablet 1   omeprazole (PRILOSEC) 20 MG capsule Take 1 capsule (20 mg total) by mouth every morning. 90 capsule 3   ondansetron (ZOFRAN) 8 MG tablet Take 1 tablet (8 mg total) by mouth every 8 (eight) hours as needed for nausea or vomiting. (Patient not taking: Reported on 02/19/2022) 20 tablet 0   Oxymetazoline HCl (RHOFADE) 1 % CREA Qam to face 30 g 11    pregabalin (LYRICA) 100 MG capsule Take 100 mg by mouth 3 (three) times daily.     Ruxolitinib Phosphate (OPZELURA) 1.5 % CREA Apply 1 application topically as directed. Qd to bid aa rash on hands and feet until clear, then prn flares (Patient not taking: Reported on 02/28/2022) 60 g 11   tiZANidine (ZANAFLEX) 2 MG tablet TAKE ONE TABLET by MOUTH THREE times daily 30 tablet 0   vitamin B-12 (CYANOCOBALAMIN) 500 MCG tablet Take 250 mcg by mouth every morning.     No current facility-administered medications for this visit.   Facility-Administered Medications Ordered in Other Visits  Medication Dose Route Frequency Provider Last Rate Last Admin   diphenhydrAMINE (BENADRYL) capsule 50 mg  50 mg Oral Once Lloyd Huger, MD        VITAL SIGNS: LMP 08/17/2016  There were no vitals filed for this visit.  Estimated body mass index is  28.04 kg/m as calculated from the following:   Height as of 02/28/22: '5\' 7"'$  (1.702 m).   Weight as of an earlier encounter on 03/07/22: 81.2 kg (179 lb).  LABS: CBC:    Component Value Date/Time   WBC 10.3 03/07/2022 0826   HGB 13.7 03/07/2022 0826   HCT 40.8 03/07/2022 0826   PLT 117 (L) 03/07/2022 0826   MCV 93.4 03/07/2022 0826   NEUTROABS 2.7 03/07/2022 0826   LYMPHSABS 6.7 (H) 03/07/2022 0826   MONOABS 0.6 03/07/2022 0826   EOSABS 0.3 03/07/2022 0826   BASOSABS 0.0 03/07/2022 0826   Comprehensive Metabolic Panel:    Component Value Date/Time   NA 139 03/07/2022 0826   NA 138 05/13/2018 1413   K 4.4 03/07/2022 0826   CL 105 03/07/2022 0826   CO2 24 03/07/2022 0826   BUN 18 03/07/2022 0826   BUN 15 05/13/2018 1413   CREATININE 1.06 (H) 03/07/2022 0826   CREATININE 0.96 12/06/2020 1646   GLUCOSE 98 03/07/2022 0826   CALCIUM 8.4 (L) 03/07/2022 0826   AST 25 03/07/2022 0826   ALT 21 03/07/2022 0826   ALKPHOS 89 03/07/2022 0826   BILITOT 0.8 03/07/2022 0826   BILITOT 0.4 05/13/2018 1413   PROT 6.3 (L) 03/07/2022 0826   PROT 6.3 05/13/2018  1413   ALBUMIN 3.5 03/07/2022 0826   ALBUMIN 4.1 05/13/2018 1413     Present during today's visit: patient only, seen in infusion  Assessment and Plan: CMP/CBC w/ diff reviewed, plan to continue lenalidomide at 10 mg daily.   Oral Chemotherapy Side Effect/Intolerance:  Fatigue: moderate, patient reports frequently needing to nap due to decreased energy levels No reported edema, diarrhea, nausea, muscle pain or rash.  Oral Chemotherapy Adherence: No missed doses reported. No patient barriers to medication adherence identified.   New medications: None reported.  Medication Access Issues: None reported.  Patient expressed understanding and was in agreement with this plan. She also understands that She can call clinic at any time with any questions, concerns, or complaints.   Follow-up plan: RTC 1 week  Thank you for allowing me to participate in the care of this very pleasant patient.   Time Total: 10 minutes  Visit consisted of counseling and education on dealing with issues of symptom management in the setting of serious and potentially life-threatening illness.Greater than 50%  of this time was spent counseling and coordinating care related to the above assessment and plan.  Signed by: [name], PharmD, BCPS, Salley Slaughter, CPP Hematology/Oncology Clinical Pharmacist Practitioner [place]/DB/AP [facility] [phone]  03/07/2022 11:27 AM

## 2022-03-08 ENCOUNTER — Other Ambulatory Visit: Payer: Self-pay

## 2022-03-08 ENCOUNTER — Encounter: Payer: Self-pay | Admitting: Nurse Practitioner

## 2022-03-08 ENCOUNTER — Telehealth (INDEPENDENT_AMBULATORY_CARE_PROVIDER_SITE_OTHER): Payer: Medicare Other | Admitting: Nurse Practitioner

## 2022-03-08 DIAGNOSIS — Z8614 Personal history of Methicillin resistant Staphylococcus aureus infection: Secondary | ICD-10-CM | POA: Diagnosis not present

## 2022-03-08 DIAGNOSIS — L089 Local infection of the skin and subcutaneous tissue, unspecified: Secondary | ICD-10-CM | POA: Diagnosis not present

## 2022-03-08 DIAGNOSIS — W57XXXA Bitten or stung by nonvenomous insect and other nonvenomous arthropods, initial encounter: Secondary | ICD-10-CM | POA: Diagnosis not present

## 2022-03-08 MED ORDER — HYDROCORTISONE 1 % EX CREA: 1.0000 | TOPICAL_CREAM | Freq: Two times a day (BID) | CUTANEOUS | 0 refills | Status: DC

## 2022-03-08 MED ORDER — MUPIROCIN 2 % EX OINT: 1.0000 | TOPICAL_OINTMENT | Freq: Two times a day (BID) | CUTANEOUS | 0 refills | Status: DC

## 2022-03-08 NOTE — Progress Notes (Signed)
Name: Monique [name]   MRN: [id number]    DOB: Mar 21, 1971   Date:03/08/2022       Progress Note  Subjective  Chief Complaint  Chief Complaint  Patient presents with   Insect Bite    I connected with  Aldona Bar  on 03/08/22 at 10:55 am by a video enabled telemedicine application and verified that I am speaking with the correct person using two identifiers.  I discussed the limitations of evaluation and management by telemedicine and the availability of in person appointments. The patient expressed understanding and agreed to proceed with a virtual visit  Staff also discussed with the patient that there may be a patient responsible charge related to this service. Patient Location: home Provider Location: home Additional Individuals present: alone  HPI  Skin infection/history of MRSA/Insect bites: Patient reports that every year she gets eaten up by insects and she has a bad reaction. She says Dr. Carlynn Purl normally gives her a cream to help with the itching and a cream for the infection.  Patient reports that this time her bites are on her right upper arm, axilla and right hip.  She says they are very itchy.  She reports that they are swollen and red. Patient does have a history of MRSA. Will send in hydrocortisone cream and mupirocin ointment.  Patient reports other than that she is doing well.   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/09/2020   Bilateral leg pain 11/03/2019   Iron deficiency anemia  10/15/2019   Bilateral arm pain 08/05/2019   Bilateral hand numbness 08/05/2019   Weakness of both hands 08/05/2019   Chronic musculoskeletal pain 07/19/2019   Chronic neuropathic pain 07/19/2019   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   Special screening for malignant neoplasms, colon    Polyp of sigmoid colon    Hemorrhoids    Trigeminal neuralgia 10/05/2018   Lumbar radiculitis (Right) 09/24/2018   Hypocalcemia 06/03/2018   Vitamin D insufficiency 06/03/2018   Abnormal MRI, lumbar spine (06/02/2020) 06/03/2018   Chronic hip pain (Right) 06/03/2018   Spondylosis without myelopathy or radiculopathy, lumbar region 06/03/2018   DDD (degenerative disc disease), lumbar 06/02/2018   Lumbar facet hypertrophy 06/02/2018   Lumbar facet arthropathy 06/02/2018   Lumbar facet syndrome (Bilateral) (R>L) 06/02/2018   Marijuana use 05/19/2018   Chronic lower extremity pain (1ry area of Pain) (Bilateral) (R>L) 05/13/2018   Chronic low back pain (2ry area of Pain) (Bilateral) (R>L) w/ sciatica (Bilateral) 05/13/2018   Chronic pain syndrome 05/13/2018   Disorder of skeletal system 05/13/2018   Pharmacologic therapy 05/13/2018   Problems influencing health status 05/13/2018   Mastalgia 05/05/2018   Breast mass, right 01/07/2018   Face pain 12/11/2017   Tingling 12/11/2017   Thoracic aortic atherosclerosis (HCC) 08/20/2017   Emphysema of lung (HCC) 08/20/2017   Adductor tendinitis 04/23/2017   Trochanteric bursitis of right hip 04/23/2017   GERD without esophagitis 12/11/2016   CLL (chronic lymphocytic leukemia) (HCC) 11/24/2016   Stress incontinence 09/04/2016   B12 deficiency 07/10/2015   Insomnia  07/10/2015   Anorexia nervosa, restricting type 07/10/2015   Anxiety, generalized 07/10/2015   H/O suicide attempt 07/10/2015   Lymphocytosis 07/10/2015   Obsessive-compulsive disorder 07/10/2015   Tobacco use 07/10/2015   History of cervical dysplasia 07/18/2014    Social History    Tobacco Use   Smoking status: Every Day    Packs/day: 1.00    Years: 31.00    Total pack years: 31.00    Types: Cigarettes    Start date: 09/25/1986   Smokeless tobacco: Never  Substance Use Topics   Alcohol use: Not Currently    Alcohol/week: 0.0 standard drinks of alcohol    Comment: rarely     Current Outpatient Medications:    ADVAIR DISKUS 250-50 MCG/ACT AEPB, INHALE 1 PUFF BY MOUTH INTO LUNGS TWICE DAILY, Disp: 180 each, Rfl: 0   ARIPiprazole (ABILIFY) 5 MG tablet, Take 1 tablet (5 mg total) by mouth daily., Disp: 90 tablet, Rfl: 0   Ascorbic Acid (VITAMIN C) 1000 MG tablet, Take 1,000 mg by mouth daily as needed (immune health)., Disp: , Rfl:    ASPIRIN 81 PO, Take 81 mg by mouth daily., Disp: , Rfl:    atorvastatin (LIPITOR) 10 MG tablet, Take 1 tablet (10 mg total) by mouth in the morning., Disp: 90 tablet, Rfl: 0   estrogens, conjugated, (PREMARIN) 0.625 MG tablet, Take 1 tablet (0.625 mg total) by mouth daily. Take daily for 21 days then do not take for 7 days., Disp: 90 tablet, Rfl: 3   loratadine (CLARITIN) 10 MG tablet, TAKE ONE TABLET BY MOUTH EVERY MORNING, Disp: 90 tablet, Rfl: 0   Magnesium 500 MG CAPS, Take 500 mg by mouth in the morning., Disp: , Rfl:    meloxicam (MOBIC) 15 MG tablet, Take 1 tablet (15 mg total) by mouth daily., Disp: 90 tablet, Rfl: 1   omeprazole (PRILOSEC) 20 MG capsule, Take 1 capsule (20 mg total) by mouth every morning., Disp: 90 capsule, Rfl: 3   Oxymetazoline HCl (RHOFADE) 1 % CREA, Qam to face, Disp: 30 g, Rfl: 11   pregabalin (LYRICA) 100 MG capsule, Take 100 mg by mouth 3 (three) times daily., Disp: , Rfl:    tiZANidine (ZANAFLEX) 2 MG tablet, TAKE ONE TABLET by MOUTH THREE times daily, Disp: 30 tablet, Rfl: 0   vitamin B-12 (CYANOCOBALAMIN) 500 MCG tablet, Take 250 mcg by mouth every morning., Disp: , Rfl:    allopurinol (ZYLOPRIM) 300 MG tablet, Take 1 tablet (300 mg total) by mouth daily. (Patient not taking: Reported on 02/28/2022),  Disp: 21 tablet, Rfl: 0   benzonatate (TESSALON) 100 MG capsule, Take 1 capsule (100 mg total) by mouth 2 (two) times daily as needed for cough. (Patient not taking: Reported on 03/08/2022), Disp: 20 capsule, Rfl: 0   Calcium Carbonate-Vit D-Min (GNP CALCIUM 1200) 1200-1000 MG-UNIT CHEW, Chew 1,200 mg by mouth daily with breakfast. Take in combination with vitamin D and magnesium. (Patient taking differently: Chew 2 tablets by mouth in the morning, at noon, and at bedtime. Take in combination with vitamin D and magnesium.), Disp: 90 tablet, Rfl: 3   Crisaborole (EUCRISA) 2 % OINT, Apply 1 application topically as directed. Qd to bid aa rash on hands and feet until clear, then prn flares (Patient not taking: Reported on 02/28/2022), Disp: 60 g, Rfl: 11   DULoxetine (CYMBALTA) 60 MG capsule, Take 1 capsule (60 mg total) by mouth daily. (Patient not taking: Reported on 02/28/2022), Disp: 90 capsule, Rfl: 0   lenalidomide (REVLIMID)  10 MG capsule, Take 1 capsule (10 mg total) by mouth daily. (Patient not taking: Reported on 02/28/2022), Disp: 28 capsule, Rfl: 0   ondansetron (ZOFRAN) 8 MG tablet, Take 1 tablet (8 mg total) by mouth every 8 (eight) hours as needed for nausea or vomiting. (Patient not taking: Reported on 02/19/2022), Disp: 20 tablet, Rfl: 0   Ruxolitinib Phosphate (OPZELURA) 1.5 % CREA, Apply 1 application topically as directed. Qd to bid aa rash on hands and feet until clear, then prn flares (Patient not taking: Reported on 02/28/2022), Disp: 60 g, Rfl: 11  No Active Allergies  I personally reviewed active problem list, medication list, allergies, notes from last encounter with the patient/caregiver today.  ROS  Constitutional: Negative for fever or weight change.  Respiratory: Negative for cough and shortness of breath.   Cardiovascular: Negative for chest pain or palpitations.  Gastrointestinal: Negative for abdominal pain, no bowel changes.  Musculoskeletal: Negative for gait problem or  joint swelling.  Skin: Negative for rash. Positive for insect bites with redness and swelling. Neurological: Negative for dizziness or headache.  No other specific complaints in a complete review of systems (except as listed in HPI above).   Objective  Virtual encounter, vitals not obtained.  There is no height or weight on file to calculate BMI.  Nursing Note and Vital Signs reviewed.  Physical Exam  Awake, alert and oriented, speaking in complete sentences.   Results for orders placed or performed in visit on 03/07/22 (from the past 72 hour(s))  Comprehensive metabolic panel     Status: Abnormal   Collection Time: 03/07/22  8:26 AM  Result Value Ref Range   Sodium 139 135 - 145 mmol/L   Potassium 4.4 3.5 - 5.1 mmol/L   Chloride 105 98 - 111 mmol/L   CO2 24 22 - 32 mmol/L   Glucose, Bld 98 70 - 99 mg/dL    Comment: Glucose reference range applies only to samples taken after fasting for at least 8 hours.   BUN 18 6 - 20 mg/dL   Creatinine, Ser 8.46 (H) 0.44 - 1.00 mg/dL   Calcium 8.4 (L) 8.9 - 10.3 mg/dL   Total Protein 6.3 (L) 6.5 - 8.1 g/dL   Albumin 3.5 3.5 - 5.0 g/dL   AST 25 15 - 41 U/L   ALT 21 0 - 44 U/L   Alkaline Phosphatase 89 38 - 126 U/L   Total Bilirubin 0.8 0.3 - 1.2 mg/dL   GFR, Estimated >96 >29 mL/min    Comment: (NOTE) Calculated using the CKD-EPI Creatinine Equation (2021)    Anion gap 10 5 - 15    Comment: Performed at [facility], [address] [place], [place] [address]  CBC with Differential     Status: Abnormal   Collection Time: 03/07/22  8:26 AM  Result Value Ref Range   WBC 10.3 4.0 - 10.5 K/uL   RBC 4.37 3.87 - 5.11 MIL/uL   Hemoglobin 13.7 12.0 - 15.0 g/dL   HCT 32.4 40.1 - 02.7 %   MCV 93.4 80.0 - 100.0 fL   MCH 31.4 26.0 - 34.0 pg   MCHC 33.6 30.0 - 36.0 g/dL   RDW 25.3 66.4 - 40.3 %   Platelets 117 (L) 150 - 400 K/uL   nRBC 0.2 0.0 - 0.2 %   Neutrophils Relative % 26 %   Neutro Abs 2.7 1.7 - 7.7 K/uL   Lymphocytes  Relative 65 %   Lymphs Abs 6.7 (H) 0.7 -  4.0 K/uL   Monocytes Relative 6 %   Monocytes Absolute 0.6 0.1 - 1.0 K/uL   Eosinophils Relative 3 %   Eosinophils Absolute 0.3 0.0 - 0.5 K/uL   Basophils Relative 0 %   Basophils Absolute 0.0 0.0 - 0.1 K/uL   WBC Morphology SMUDGE CELLS     Comment: DIFF. CONFIRMED BY SMEAR   RBC Morphology MORPHOLOGY UNREMARKABLE    Smear Review Normal platelet morphology     Comment: PLATELETS APPEAR DECREASED   Abs Immature Granulocytes 0.00 0.00 - 0.07 K/uL    Comment: Performed at [facility], [address] [place], [place] [address]    Assessment & Plan  1. Skin infection -if no improvement patient needs in person appointment.  - mupirocin ointment (BACTROBAN) 2 %; Apply 1 Application topically 2 (two) times daily.  Dispense: 22 g; Refill: 0  2. History of MRSA infection  - mupirocin ointment (BACTROBAN) 2 %; Apply 1 Application topically 2 (two) times daily.  Dispense: 22 g; Refill: 0  3. Bug bite, initial encounter  - hydrocortisone cream 1 %; Apply 1 Application topically 2 (two) times daily.  Dispense: 30 g; Refill: 0   -Red flags and when to present for emergency care or RTC including fever >101.66F, chest pain, shortness of breath, new/worsening/un-resolving symptoms,  reviewed with patient at time of visit. Follow up and care instructions discussed and provided in AVS. - I discussed the assessment and treatment plan with the patient. The patient was provided an opportunity to ask questions and all were answered. The patient agreed with the plan and demonstrated an understanding of the instructions.  I provided 15 minutes of non-face-to-face time during this encounter.  Berniece Salines, FNP

## 2022-03-08 NOTE — Progress Notes (Signed)
[place]  Telephone:([phone]) [phone] [phone]) [phone]  ID: Monique [name] OB: 1971-02-27  MR#: [id number]  ALP#:[id number]  Patient Care Team: [name], MD as PCP - General (Family Medicine) Lloyd Huger, MD as Consulting Physician (Oncology) Lane Hacker, [facility] as Pharmacist (Pharmacist) Greg Cutter, LCSW as [place] Management (Licensed Clinical Social Worker)  CHIEF COMPLAINT: CLL, iron deficiency.  INTERVAL HISTORY: Patient returns to clinic today for further evaluation and consideration of cycle 3 of weekly cisplatin.  She continues to tolerate daily Revlimid well.  She had some mild cramping and pain in her left leg, but this is intermittent and does not affect her day-to-day activity.  She otherwise feels well.  She has no neurologic complaints.  She denies any fevers or night sweats.  She has a good appetite and denies weight loss.  She has no chest pain, shortness of breath, cough, or hemoptysis.  She denies any nausea, vomiting, constipation, or diarrhea.  She has no melena or hematochezia.  She has no urinary complaints.  Patient offers no further specific complaints today.  REVIEW OF SYSTEMS:   Review of Systems  Constitutional:  Negative for diaphoresis, fever, malaise/fatigue and weight loss.  HENT: Negative.  Negative for congestion.   Respiratory: Negative.  Negative for cough and shortness of breath.   Cardiovascular: Negative.  Negative for chest pain and leg swelling.  Gastrointestinal:  Negative for abdominal pain and constipation.  Genitourinary: Negative.  Negative for dysuria and frequency.  Musculoskeletal: Negative.  Negative for back pain, myalgias and neck pain.  Skin: Negative.   Neurological: Negative.  Negative for tingling, sensory change, focal weakness and weakness.  Psychiatric/Behavioral: Negative.  Negative for depression and suicidal ideas. The patient is not nervous/anxious.     As  per HPI. Otherwise, a complete review of systems is negative.  PAST MEDICAL HISTORY: Past Medical History:  Diagnosis Date   Anxiety    Bursitis    leg pain   Carpal tunnel syndrome    Cervical dysplasia    hx LEEP over 18 years ago.    Chronic lymphocytic leukemia ([place]) 2018   Dr Grayland Ormond.  (in lymph nodes)   COPD (chronic obstructive pulmonary disease) ([place])    COVID-19 2021   Depression    Eating disorder    GERD (gastroesophageal reflux disease)    History of self-harm    Insomnia    Obsession    Tobacco abuse    Vitamin B12 deficiency (non anemic)     PAST SURGICAL HISTORY: Past Surgical History:  Procedure Laterality Date   BREAST BIOPSY Right 01/05/2018   US guided biopsy of 2 areas and 1 lymph node, MIXED INFLAMMATION AND GIANT CELL REACTION   CERVICAL BIOPSY  W/ LOOP ELECTRODE EXCISION     COLONOSCOPY WITH PROPOFOL N/A 04/21/2019   Procedure: COLONOSCOPY WITH PROPOFOL;  Surgeon: Virgel Manifold, MD;  Location: [facility] ENDOSCOPY;  Service: Gastroenterology;  Laterality: N/A;   OTHER SURGICAL HISTORY     scar tissue removed from vocal cords   TUBAL LIGATION     VAGINAL HYSTERECTOMY N/A 01/22/2021   Procedure: HYSTERECTOMY VAGINAL; BILATERAL SALPINGECTOMY;  Surgeon: Rubie Maid, MD;  Location: [facility] ORS;  Service: Gynecology;  Laterality: N/A;   vocal cord surgery  2005    FAMILY HISTORY: Family History  Problem Relation Age of Onset   Depression Mother    Cancer Mother        thyroid   Alcohol abuse Father  Alcohol abuse Brother    Depression Brother    Bipolar disorder Brother    Suicidality Brother    ADD / ADHD Son    Breast cancer Neg Hx     ADVANCED DIRECTIVES (Y/N):  N  HEALTH MAINTENANCE: Social History   Tobacco Use   Smoking status: Every Day    Packs/day: 1.00    Years: 31.00    Total pack years: 31.00    Types: Cigarettes    Start date: 09/25/1986   Smokeless tobacco: Never  Vaping Use   Vaping Use: Former  Substance Use Topics    Alcohol use: Not Currently    Alcohol/week: 0.0 standard drinks of alcohol    Comment: rarely   Drug use: Yes    Frequency: 7.0 times per week    Types: Marijuana     Colonoscopy:  PAP:  Bone density:  Lipid panel:  No Active Allergies  Current Outpatient Medications  Medication Sig Dispense Refill   ADVAIR DISKUS 250-50 MCG/ACT AEPB INHALE 1 PUFF BY MOUTH INTO LUNGS TWICE DAILY 180 each 0   ARIPiprazole (ABILIFY) 5 MG tablet Take 1 tablet (5 mg total) by mouth daily. 90 tablet 0   Ascorbic Acid (VITAMIN C) 1000 MG tablet Take 1,000 mg by mouth daily as needed (immune health).     ASPIRIN 81 PO Take 81 mg by mouth daily.     atorvastatin (LIPITOR) 10 MG tablet Take 1 tablet (10 mg total) by mouth in the morning. 90 tablet 0   Calcium Carbonate-Vit D-Min (GNP CALCIUM 1200) 1200-1000 MG-UNIT CHEW Chew 1,200 mg by mouth daily with breakfast. Take in combination with vitamin D and magnesium. (Patient taking differently: Chew 2 tablets by mouth in the morning, at noon, and at bedtime. Take in combination with vitamin D and magnesium.) 90 tablet 3   estrogens, conjugated, (PREMARIN) 0.625 MG tablet Take 1 tablet (0.625 mg total) by mouth daily. Take daily for 21 days then do not take for 7 days. 90 tablet 3   hydrocortisone cream 1 % Apply 1 Application topically 2 (two) times daily. 30 g 0   lenalidomide (REVLIMID) 10 MG capsule Take 10 mg by mouth daily.     loratadine (CLARITIN) 10 MG tablet TAKE ONE TABLET BY MOUTH EVERY MORNING 90 tablet 0   Magnesium 500 MG CAPS Take 500 mg by mouth in the morning.     meloxicam (MOBIC) 15 MG tablet Take 1 tablet (15 mg total) by mouth daily. 90 tablet 1   mupirocin ointment (BACTROBAN) 2 % Apply 1 Application topically 2 (two) times daily. 22 g 0   omeprazole (PRILOSEC) 20 MG capsule Take 1 capsule (20 mg total) by mouth every morning. 90 capsule 3   Oxymetazoline HCl (RHOFADE) 1 % CREA Qam to face 30 g 11   pregabalin (LYRICA) 100 MG capsule Take  100 mg by mouth 3 (three) times daily.     tiZANidine (ZANAFLEX) 2 MG tablet TAKE ONE TABLET by MOUTH THREE times daily 30 tablet 0   vitamin B-12 (CYANOCOBALAMIN) 500 MCG tablet Take 250 mcg by mouth every morning.     Crisaborole 2 % OINT Apply topically.     DULoxetine (CYMBALTA) 60 MG capsule Take 60 mg by mouth daily.     ondansetron (ZOFRAN) 8 MG tablet Take by mouth every 8 (eight) hours as needed.     Ruxolitinib Phosphate 1.5 % CREA Apply topically.     No current facility-administered medications for this visit.  OBJECTIVE: Vitals:   03/14/22 0924  BP: 99/70  Pulse: 93  Resp: 18  Temp: (!) 97.2 F (36.2 C)  SpO2: 100%      Body mass index is 27.68 kg/m.    ECOG FS:0 - Asymptomatic  General: Well-developed, well-nourished, no acute distress. Eyes: Pink conjunctiva, anicteric sclera. HEENT: Normocephalic, moist mucous membranes. Lungs: No audible wheezing or coughing. Heart: Regular rate and rhythm. Abdomen: Soft, nontender, no obvious distention. Musculoskeletal: No edema, cyanosis, or clubbing. Neuro: Alert, answering all questions appropriately. Cranial nerves grossly intact. Skin: No rashes or petechiae noted. Psych: Normal affect. Lymphatics: No palpable lymphadenopathy.  LAB RESULTS:  Lab Results  Component Value Date   NA 137 03/14/2022   K 4.0 03/14/2022   CL 105 03/14/2022   CO2 24 03/14/2022   GLUCOSE 132 (H) 03/14/2022   BUN 19 03/14/2022   CREATININE 1.11 (H) 03/14/2022   CALCIUM 8.4 (L) 03/14/2022   PROT 6.0 (L) 03/14/2022   ALBUMIN 3.5 03/14/2022   AST 28 03/14/2022   ALT 23 03/14/2022   ALKPHOS 73 03/14/2022   BILITOT 0.6 03/14/2022   GFRNONAA 60 (L) 03/14/2022   GFRAA 79 12/06/2020    Lab Results  Component Value Date   WBC 10.2 03/14/2022   NEUTROABS 2.6 03/14/2022   HGB 13.5 03/14/2022   HCT 40.0 03/14/2022   MCV 94.3 03/14/2022   PLT 120 (L) 03/14/2022     STUDIES: No results found.  ASSESSMENT: CLL,  iron  deficiency.  PLAN:    1. CLL: Confirmed by peripheral blood flow cytometry.  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 Patient completed cycle 3 of Rituxan plus Treanda on March 26, 2018, but was noted to have progressive disease.  She was then initiated on 480 mg Imbruvica in November 2019, but now appears to have progression of disease with increasing white blood cell count as well as CT scan on Jan 22, 2022 revealing increased lymphadenopathy consistent with progression of disease.  Attempted patient on venetoclax, but she could not tolerate it secondary to persistent diarrhea and transaminitis.  Her symptoms have now resolved.  Proceed with cycle 3 of 4 weekly Rituxan today.  Continue 10 mg Revlimid daily.  If patient does not have a durable response, can consider rechallenging with venetoclax.  Return to clinic in 1 week for further evaluation and consideration of cycle 4 at which point patient can be transitioned to monthly Rituxan.  Appreciate clinical pharmacy input. 2.  Leukocytosis: Resolved.  Secondary to CLL.  Treatment as above.   3.  Trigeminal neuralgia: Patient does not complain of this today.  Appears to be unrelated to CLL or Imbruvica.  MRI of the brain on Jan 25, 2019 was unremarkable.   4.  Depression/anxiety: Chronic.  Continue follow-up and treatment per primary care. 5.  Neuropathic pain/peripheral neuropathy: Chronic and unchanged.  Patient reports she is now on disability.  Continue current follow-up and treatment as per neurology. 6.  Iron deficiency: Resolved.   7.  Rituxan reaction: Patient has been given additional premedications for preventative measures.  She did not have a reaction during cycle 2 or 3. 8.  Thrombocytopenia: Mild, monitor.    Patient expressed understanding and was in agreement with this plan. She also understands that She can call clinic at any time with any questions, concerns,  or complaints.    Lloyd Huger, MD   03/15/2022 7:17 AM

## 2022-03-12 ENCOUNTER — Ambulatory Visit: Payer: Medicaid Other | Admitting: Oncology

## 2022-03-12 ENCOUNTER — Ambulatory Visit: Payer: Medicaid Other | Admitting: Pharmacist

## 2022-03-12 ENCOUNTER — Ambulatory Visit: Payer: Medicaid Other

## 2022-03-12 ENCOUNTER — Other Ambulatory Visit: Payer: Medicaid Other

## 2022-03-14 ENCOUNTER — Inpatient Hospital Stay: Payer: Medicare Other

## 2022-03-14 ENCOUNTER — Inpatient Hospital Stay: Payer: Medicare Other | Admitting: Pharmacist

## 2022-03-14 ENCOUNTER — Inpatient Hospital Stay (HOSPITAL_BASED_OUTPATIENT_CLINIC_OR_DEPARTMENT_OTHER): Payer: Medicare Other | Admitting: Oncology

## 2022-03-14 VITALS — BP 106/75 | HR 78 | Temp 96.3°F | Resp 16

## 2022-03-14 VITALS — BP 99/70 | HR 93 | Temp 97.2°F | Resp 18 | Wt 176.7 lb

## 2022-03-14 DIAGNOSIS — C9110 Chronic lymphocytic leukemia of B-cell type not having achieved remission: Secondary | ICD-10-CM | POA: Diagnosis not present

## 2022-03-14 DIAGNOSIS — G608 Other hereditary and idiopathic neuropathies: Secondary | ICD-10-CM

## 2022-03-14 DIAGNOSIS — Z5112 Encounter for antineoplastic immunotherapy: Secondary | ICD-10-CM | POA: Diagnosis not present

## 2022-03-14 DIAGNOSIS — Z79899 Other long term (current) drug therapy: Secondary | ICD-10-CM

## 2022-03-14 LAB — COMPREHENSIVE METABOLIC PANEL
ALT: 23 U/L (ref 0–44)
AST: 28 U/L (ref 15–41)
Albumin: 3.5 g/dL (ref 3.5–5.0)
Alkaline Phosphatase: 73 U/L (ref 38–126)
Anion gap: 8 (ref 5–15)
BUN: 19 mg/dL (ref 6–20)
CO2: 24 mmol/L (ref 22–32)
Calcium: 8.4 mg/dL — ABNORMAL LOW (ref 8.9–10.3)
Chloride: 105 mmol/L (ref 98–111)
Creatinine, Ser: 1.11 mg/dL — ABNORMAL HIGH (ref 0.44–1.00)
GFR, Estimated: 60 mL/min — ABNORMAL LOW (ref >60)
Glucose, Bld: 132 mg/dL — ABNORMAL HIGH (ref 70–99)
Potassium: 4 mmol/L (ref 3.5–5.1)
Sodium: 137 mmol/L (ref 135–145)
Total Bilirubin: 0.6 mg/dL (ref 0.3–1.2)
Total Protein: 6 g/dL — ABNORMAL LOW (ref 6.5–8.1)

## 2022-03-14 LAB — CBC WITH DIFFERENTIAL/PLATELET
Abs Immature Granulocytes: 0 10*3/uL (ref 0.00–0.07)
Basophils Absolute: 0.1 10*3/uL (ref 0.0–0.1)
Basophils Relative: 1 %
Eosinophils Absolute: 0.2 10*3/uL (ref 0.0–0.5)
Eosinophils Relative: 2 %
HCT: 40 % (ref 36.0–46.0)
Hemoglobin: 13.5 g/dL (ref 12.0–15.0)
Lymphocytes Relative: 70 %
Lymphs Abs: 7.1 10*3/uL — ABNORMAL HIGH (ref 0.7–4.0)
MCH: 31.8 pg (ref 26.0–34.0)
MCHC: 33.8 g/dL (ref 30.0–36.0)
MCV: 94.3 fL (ref 80.0–100.0)
Monocytes Absolute: 0.2 10*3/uL (ref 0.1–1.0)
Monocytes Relative: 2 %
Neutro Abs: 2.6 10*3/uL (ref 1.7–7.7)
Neutrophils Relative %: 25 %
Platelets: 120 10*3/uL — ABNORMAL LOW (ref 150–400)
RBC: 4.24 MIL/uL (ref 3.87–5.11)
RDW: 14.1 % (ref 11.5–15.5)
Smear Review: NORMAL
WBC: 10.2 10*3/uL (ref 4.0–10.5)
nRBC: 0 % (ref 0.0–0.2)

## 2022-03-14 MED ORDER — SODIUM CHLORIDE 0.9 % IV SOLN
375.0000 mg/m2 | Freq: Once | INTRAVENOUS | Status: AC
  Administered 2022-03-14: 700 mg via INTRAVENOUS
  Filled 2022-03-14: qty 50

## 2022-03-14 MED ORDER — FAMOTIDINE IN NACL 20-0.9 MG/50ML-% IV SOLN
20.0000 mg | Freq: Once | INTRAVENOUS | Status: AC
  Administered 2022-03-14: 20 mg via INTRAVENOUS
  Filled 2022-03-14: qty 50

## 2022-03-14 MED ORDER — ACETAMINOPHEN 325 MG PO TABS
650.0000 mg | ORAL_TABLET | Freq: Once | ORAL | Status: AC
  Administered 2022-03-14: 650 mg via ORAL
  Filled 2022-03-14: qty 2

## 2022-03-14 MED ORDER — SODIUM CHLORIDE 0.9 % IV SOLN
Freq: Once | INTRAVENOUS | Status: AC
  Filled 2022-03-14: qty 250

## 2022-03-14 MED ORDER — DIPHENHYDRAMINE HCL 50 MG/ML IJ SOLN
50.0000 mg | Freq: Once | INTRAMUSCULAR | Status: AC
  Administered 2022-03-14: 50 mg via INTRAVENOUS
  Filled 2022-03-14: qty 1

## 2022-03-14 MED ORDER — SODIUM CHLORIDE 0.9 % IV SOLN
20.0000 mg | Freq: Once | INTRAVENOUS | Status: AC
  Administered 2022-03-14: 20 mg via INTRAVENOUS
  Filled 2022-03-14: qty 20

## 2022-03-14 NOTE — Patient Instructions (Signed)
[facility]  Discharge Instructions: Thank you for choosing [facility] to provide your oncology and hematology care.  If you have a lab appointment with the Cancer Center, please go directly to the Cancer Center and check in at the registration area.  Wear comfortable clothing and clothing appropriate for easy access to any Portacath or PICC line.   We strive to give you quality time with your provider. You may need to reschedule your appointment if you arrive late (15 or more minutes).  Arriving late affects you and other patients whose appointments are after yours.  Also, if you miss three or more appointments without notifying the office, you may be dismissed from the clinic at the provider's discretion.      For prescription refill requests, have your pharmacy contact our office and allow 72 hours for refills to be completed.    Today you received the following chemotherapy and/or immunotherapy agents: Ruxience      To help prevent nausea and vomiting after your treatment, we encourage you to take your nausea medication as directed.  BELOW ARE SYMPTOMS THAT SHOULD BE REPORTED IMMEDIATELY: *FEVER GREATER THAN 100.4 F (38 C) OR HIGHER *CHILLS OR SWEATING *NAUSEA AND VOMITING THAT IS NOT CONTROLLED WITH YOUR NAUSEA MEDICATION *UNUSUAL SHORTNESS OF BREATH *UNUSUAL BRUISING OR BLEEDING *URINARY PROBLEMS (pain or burning when urinating, or frequent urination) *BOWEL PROBLEMS (unusual diarrhea, constipation, pain near the anus) TENDERNESS IN MOUTH AND THROAT WITH OR WITHOUT PRESENCE OF ULCERS (sore throat, sores in mouth, or a toothache) UNUSUAL RASH, SWELLING OR PAIN  UNUSUAL VAGINAL DISCHARGE OR ITCHING   Items with * indicate a potential emergency and should be followed up as soon as possible or go to the Emergency Department if any problems should occur.  Please show the CHEMOTHERAPY ALERT CARD or IMMUNOTHERAPY ALERT CARD at check-in to  the Emergency Department and triage nurse.  Should you have questions after your visit or need to cancel or reschedule your appointment, please contact [facility]  [phone] and follow the prompts.  Office hours are 8:00 a.m. to 4:30 p.m. Monday - Friday. Please note that voicemails left after 4:00 p.m. may not be returned until the following business day.  We are closed weekends and major holidays. You have access to a nurse at all times for urgent questions. Please call the main number to the clinic [phone] and follow the prompts.  For any non-urgent questions, you may also contact your provider using MyChart. We now offer e-Visits for anyone 18 and older to request care online for non-urgent symptoms. For details visit [email or web address].   Also download the MyChart app! Go to the app store, search "MyChart", open the app, select [facility], and log in with your MyChart username and password.  Masks are optional in the cancer centers. If you would like for your care team to wear a mask while they are taking care of you, please let them know. For doctor visits, patients may have with them one support person who is at least 53 years old. At this time, visitors are not allowed in the infusion area.   

## 2022-03-14 NOTE — Progress Notes (Signed)
[place]  Telephone:([phone] [phone]  Patient Care Team: [name], MD as PCP - General (Family Medicine) Lloyd Huger, MD as Consulting Physician (Oncology) Lane Hacker, [facility] as Pharmacist (Pharmacist) Alyce Pagan as [place] [facility] (Licensed Clinical Social Worker)   Name of the patient: Monique [name]  [id number]  04-22-1971   Date of visit: 03/14/22  HPI: Patient is a 51 y.o. female with  progressive CLL, previously treated with ibrutinib. Patient was intolerant to venetoclax treatment initiation and did not want to retry initiating venetoclax. The plan is to now treat her with rituximab and lenalidomide. Patient started rituximab on 02/28/22 and lenalidomide 03/01/22.  Reason for Consult: Oral chemotherapy follow-up for lenalidomide therapy.   PAST MEDICAL HISTORY: Past Medical History:  Diagnosis Date   Anxiety    Bursitis    leg pain   Carpal tunnel syndrome    Cervical dysplasia    hx LEEP over 18 years ago.    Chronic lymphocytic leukemia ([place]) 2018   Dr Grayland Ormond.  (in lymph nodes)   COPD (chronic obstructive pulmonary disease) ([place])    COVID-19 2021   Depression    Eating disorder    GERD (gastroesophageal reflux disease)    History of self-harm    Insomnia    Obsession    Tobacco abuse    Vitamin B12 deficiency (non anemic)     HEMATOLOGY/ONCOLOGY HISTORY:  Oncology History Overview Note  Patient with recent CLL diagnosis by flow cytometry.  Previously not being treated in observation.   S/p 4 cycles of weekly Rituxan completing on 02/20/2017 scans from January 2018 revealed minimal progression.   Developed right breast mass in March 2019.  Had diagnostic mammogram performed on 12/26/2017 showed suspicious right breast mass.  Biopsy revealed granulomatous mastitis.  She was treated with doxycycline 100 mg twice daily for 7 days.   Developed worsening  adenopathy.  CT chest/abdomen/pelvis and soft tissue neck from May 2019 revealed progressive bulky adenopathy in the chest, abdomen and pelvis compatible with worsening disease.   [name] recommended beginning treatment with Rituxan and Treanda.  Received first cycle last week.  S/p 6 cycles of Rituxan and Treanda.  Tolerating well.  Last dose given 03/25/18.   CLL (chronic lymphocytic leukemia) ([place])  11/24/2016 Initial Diagnosis   CLL (chronic lymphocytic leukemia) ([place])   01/21/2018 - 03/26/2018 Chemotherapy   The patient had palonosetron (ALOXI) injection 0.25 mg, 0.25 mg, Intravenous,  Once, 4 of 5 cycles Administration: 0.25 mg (01/28/2018), 0.25 mg (02/25/2018), 0.25 mg (03/25/2018) riTUXimab (RITUXAN) 700 mg in sodium chloride 0.9 % 250 mL (2.1875 mg/mL) infusion, 375 mg/m2 = 700 mg, Intravenous,  Once, 4 of 5 cycles Administration: 700 mg (01/28/2018) bendamustine (BENDEKA) 150 mg in sodium chloride 0.9 % 50 mL (2.6786 mg/mL) chemo infusion, 90 mg/m2 = 150 mg, Intravenous,  Once, 4 of 5 cycles Administration: 150 mg (01/28/2018), 150 mg (01/29/2018), 150 mg (02/25/2018), 150 mg (02/26/2018), 150 mg (03/25/2018), 150 mg (03/26/2018)  for chemotherapy treatment.    02/28/2022 -  Chemotherapy   Patient is on Treatment Plan : NON-HODGKINS LYMPHOMA Rituximab q7d     03/12/2022 - 03/12/2022 Chemotherapy   Patient is on Treatment Plan : LYMPHOMA CLL/SLL Venetoclax ramp up / Venetoclax + Rituximab (375/500) q28d x 6 cycles / Venetoclax q28d x 18 cycles       ALLERGIES:  has no active allergies.  MEDICATIONS:  Current Outpatient Medications  Medication  Sig Dispense Refill   Crisaborole 2 % OINT Apply topically.     DULoxetine (CYMBALTA) 60 MG capsule Take 60 mg by mouth daily.     lenalidomide (REVLIMID) 10 MG capsule Take 10 mg by mouth daily.     ondansetron (ZOFRAN) 8 MG tablet Take by mouth every 8 (eight) hours as needed.     Ruxolitinib Phosphate 1.5 % CREA Apply topically.     ADVAIR  DISKUS 250-50 MCG/ACT AEPB INHALE 1 PUFF BY MOUTH INTO LUNGS TWICE DAILY 180 each 0   ARIPiprazole (ABILIFY) 5 MG tablet Take 1 tablet (5 mg total) by mouth daily. 90 tablet 0   Ascorbic Acid (VITAMIN C) 1000 MG tablet Take 1,000 mg by mouth daily as needed (immune health).     ASPIRIN 81 PO Take 81 mg by mouth daily.     atorvastatin (LIPITOR) 10 MG tablet Take 1 tablet (10 mg total) by mouth in the morning. 90 tablet 0   Calcium Carbonate-Vit D-Min (GNP CALCIUM 1200) 1200-1000 MG-UNIT CHEW Chew 1,200 mg by mouth daily with breakfast. Take in combination with vitamin D and magnesium. (Patient taking differently: Chew 2 tablets by mouth in the morning, at noon, and at bedtime. Take in combination with vitamin D and magnesium.) 90 tablet 3   estrogens, conjugated, (PREMARIN) 0.625 MG tablet Take 1 tablet (0.625 mg total) by mouth daily. Take daily for 21 days then do not take for 7 days. 90 tablet 3   hydrocortisone cream 1 % Apply 1 Application topically 2 (two) times daily. 30 g 0   loratadine (CLARITIN) 10 MG tablet TAKE ONE TABLET BY MOUTH EVERY MORNING 90 tablet 0   Magnesium 500 MG CAPS Take 500 mg by mouth in the morning.     meloxicam (MOBIC) 15 MG tablet Take 1 tablet (15 mg total) by mouth daily. 90 tablet 1   mupirocin ointment (BACTROBAN) 2 % Apply 1 Application topically 2 (two) times daily. 22 g 0   omeprazole (PRILOSEC) 20 MG capsule Take 1 capsule (20 mg total) by mouth every morning. 90 capsule 3   Oxymetazoline HCl (RHOFADE) 1 % CREA Qam to face 30 g 11   pregabalin (LYRICA) 100 MG capsule Take 100 mg by mouth 3 (three) times daily.     tiZANidine (ZANAFLEX) 2 MG tablet TAKE ONE TABLET by MOUTH THREE times daily 30 tablet 0   vitamin B-12 (CYANOCOBALAMIN) 500 MCG tablet Take 250 mcg by mouth every morning.     No current facility-administered medications for this visit.   Facility-Administered Medications Ordered in Other Visits  Medication Dose Route Frequency Provider Last  Rate Last Admin   riTUXimab-pvvr (RUXIENCE) 700 mg in sodium chloride 0.9 % 250 mL (2.1875 mg/mL) infusion  375 mg/m2 (Treatment Plan Recorded) Intravenous Once Lloyd Huger, MD        VITAL SIGNS: LMP 08/17/2016  There were no vitals filed for this visit.  Estimated body mass index is 27.68 kg/m as calculated from the following:   Height as of 02/28/22: '5\' 7"'$  (1.702 m).   Weight as of an earlier encounter on 03/14/22: 80.2 kg (176 lb 11.2 oz).  LABS: CBC:    Component Value Date/Time   WBC 10.2 03/14/2022 0844   HGB 13.5 03/14/2022 0844   HCT 40.0 03/14/2022 0844   PLT 120 (L) 03/14/2022 0844   MCV 94.3 03/14/2022 0844   NEUTROABS 2.6 03/14/2022 0844   LYMPHSABS 7.1 (H) 03/14/2022 0844   MONOABS 0.2 03/14/2022 0844  EOSABS 0.2 03/14/2022 0844   BASOSABS 0.1 03/14/2022 0844   Comprehensive Metabolic Panel:    Component Value Date/Time   NA 137 03/14/2022 0844   NA 138 05/13/2018 1413   K 4.0 03/14/2022 0844   CL 105 03/14/2022 0844   CO2 24 03/14/2022 0844   BUN 19 03/14/2022 0844   BUN 15 05/13/2018 1413   CREATININE 1.11 (H) 03/14/2022 0844   CREATININE 0.96 12/06/2020 1646   GLUCOSE 132 (H) 03/14/2022 0844   CALCIUM 8.4 (L) 03/14/2022 0844   AST 28 03/14/2022 0844   ALT 23 03/14/2022 0844   ALKPHOS 73 03/14/2022 0844   BILITOT 0.6 03/14/2022 0844   BILITOT 0.4 05/13/2018 1413   PROT 6.0 (L) 03/14/2022 0844   PROT 6.3 05/13/2018 1413   ALBUMIN 3.5 03/14/2022 0844   ALBUMIN 4.1 05/13/2018 1413     Present during today's visit: patient only  Assessment and Plan: CMP/CBC reviewed, plan to continue lenalidomide at 10 mg daily.   Oral Chemotherapy Side Effect/Intolerance:  Fatigue: continue to be moderate Leg weakness and neuropathic plan: patient reports still taking pregabalin for neuropathy No reported edema, diarrhea, nausea, muscle pain or rash.  Oral Chemotherapy Adherence: No missed doses reported. No patient barriers to medication adherence  identified.   New medications: None reported.  Medication Access Issues: None reported.  Patient expressed understanding and was in agreement with this plan. She also understands that She can call clinic at any time with any questions, concerns, or complaints.   Follow-up plan: RTC 1 week  Thank you for allowing me to participate in the care of this very pleasant patient.   Time Total: 10 minutes  Visit consisted of counseling and education on dealing with issues of symptom management in the setting of serious and potentially life-threatening illness.Greater than 50%  of this time was spent counseling and coordinating care related to the above assessment and plan.  Signed by: [name], PharmD, BCPS, Salley Slaughter, CPP Hematology/Oncology Clinical Pharmacist Practitioner [place]/DB/AP Oral [facility] [phone]  03/14/2022 11:10 AM

## 2022-03-14 NOTE — Progress Notes (Signed)
Prior to Ruxience pt.'s BP 81/47 HR-63. Pt is resting and is asymptomatic. Pt states "I feel fine." Per MD to proceed with treatment, no new orders given at this time. Ruxience started at 1207 according to subsequent Infusion protocol.   At 1237 BP 77/50 HR 80. Pt asymptomatic, she states, "I feel fine." MD made aware. Per MD to proceed wit treatment, no new orders given at this time.   Ruxience finished at 1450. Post Ruxience infusion, T-96.3 tympanic, BP 106/75 , HR 78, RR-16. Pt has no complaints at this time. Pt tolerated Ruxience infusion well with no complications. RN educated pt on the importance of notifying the clinic if any complications occur at home. Pt verbalized understanding. Pt stable at discharge. VSS.    Katlin Bortner [employer]

## 2022-03-14 NOTE — Progress Notes (Signed)
  Pt states that she has been experiencing a sharp burning sensating on both of her legs lasting about 1-2 minutes. As well as, not sure if it has anything to do with her dx but she is constantly blowing her nose full of mucus and blood. Also, one day out with her grandson she felt as if her legs would give out so she went in the house and put her legs up for 4 hours. Will like to discuss potassium levels due to cramping in her feet and arms. Fatigued all of the time. Still experiencing severe hot flashes.

## 2022-03-15 ENCOUNTER — Other Ambulatory Visit: Payer: Self-pay | Admitting: Oncology

## 2022-03-15 ENCOUNTER — Encounter: Payer: Self-pay | Admitting: Oncology

## 2022-03-15 DIAGNOSIS — C9110 Chronic lymphocytic leukemia of B-cell type not having achieved remission: Secondary | ICD-10-CM

## 2022-03-18 NOTE — Progress Notes (Deleted)
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 last visit was in October 2021 , she is on oral medication Imbruvica daily. She has an appointment next month    Low back pain and radiculitis/neuropahty : she is on Lyrica, duloxetine, Tizanidine and Meloxicam  per pain management - Dr. Wynona Canes  and neurologist, from now on I will give her the muscle relaxer and nsaid's and requested by Dr. Wynona Canes . Pain is constant and stable around 5/10   She states tingling, numbness, pain comes and goes on both legs and arms, and sometimes stumbles when walking. Current pain is zero    Emphysema: she has been smoking. She has daily cough,  no wheezing but has intermittent sob -usually with activity- she thinks usually when she has hot flashes. She is on Advair once a day and albuterol    Atherosclerosis: she has been taking Atorvastatin and no side effects. Last LDL was 80,. Unchanged    Carpal Tunnel: had NCS done by Dr. 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    MDD: phq 9 is much higher, she has not seen her therapist in a while, taking zoloft ( given by Dr. 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  06/05/2021   Lumbosacral radiculopathy/radiculitis at L5 (Bilateral) 06/05/2021   PAD (peripheral artery disease) ([place]) 02/14/2021   CIN II (cervical intraepithelial neoplasia II) 02/01/2021   S/P vaginal hysterectomy 01/22/2021   Acute right hip pain 06/05/2020   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/09/2020   Bilateral leg pain 11/03/2019   Iron deficiency anemia 10/15/2019   Bilateral arm pain 08/05/2019   Bilateral hand numbness 08/05/2019   Weakness of both hands 08/05/2019   Chronic musculoskeletal pain 07/19/2019   Chronic neuropathic pain 07/19/2019   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   Special screening for malignant neoplasms, colon    Polyp of sigmoid colon    Hemorrhoids    Trigeminal neuralgia 10/05/2018   Lumbar radiculitis (Right) 09/24/2018   Hypocalcemia 06/03/2018   Vitamin D insufficiency 06/03/2018   Abnormal MRI, lumbar spine (06/02/2020) 06/03/2018   Chronic hip pain (Right) 06/03/2018   Spondylosis without myelopathy or radiculopathy, lumbar region 06/03/2018   DDD (degenerative disc disease), lumbar 06/02/2018   Lumbar facet hypertrophy 06/02/2018   Lumbar facet arthropathy 06/02/2018   Lumbar facet syndrome (Bilateral) (R>L) 06/02/2018   Marijuana use 05/19/2018   Chronic lower extremity pain (1ry area of Pain) (Bilateral) (R>L) 05/13/2018   Chronic low back pain (2ry area of Pain) (Bilateral) (R>L) w/ sciatica (Bilateral) 05/13/2018   Chronic pain syndrome 05/13/2018   Disorder of skeletal system 05/13/2018   Pharmacologic  therapy 05/13/2018   Problems influencing health status 05/13/2018   Mastalgia 05/05/2018   Breast mass, right 01/07/2018   Face pain 12/11/2017   Tingling 12/11/2017   Thoracic aortic atherosclerosis ([place]) 08/20/2017   Emphysema of lung ([place]) 08/20/2017   Adductor tendinitis 04/23/2017   Trochanteric bursitis of right hip 04/23/2017   GERD without esophagitis 12/11/2016   CLL (chronic lymphocytic leukemia) ([place]) 11/24/2016    Stress incontinence 09/04/2016   B12 deficiency 07/10/2015   Insomnia 07/10/2015   Anorexia nervosa, restricting type 07/10/2015   Anxiety, generalized 07/10/2015   H/O suicide attempt 07/10/2015   Lymphocytosis 07/10/2015   Obsessive-compulsive disorder 07/10/2015   Tobacco use 07/10/2015   History of cervical dysplasia 07/18/2014    Past Surgical History:  Procedure Laterality Date   BREAST BIOPSY Right 01/05/2018   US guided biopsy of 2 areas and 1 lymph node, MIXED INFLAMMATION AND GIANT CELL REACTION   CERVICAL BIOPSY  W/ LOOP ELECTRODE EXCISION     COLONOSCOPY WITH PROPOFOL N/A 04/21/2019   Procedure: COLONOSCOPY WITH PROPOFOL;  Surgeon: Virgel Manifold, MD;  Location: [facility] ENDOSCOPY;  Service: Gastroenterology;  Laterality: N/A;   OTHER SURGICAL HISTORY     scar tissue removed from vocal cords   TUBAL LIGATION     VAGINAL HYSTERECTOMY N/A 01/22/2021   Procedure: HYSTERECTOMY VAGINAL; BILATERAL SALPINGECTOMY;  Surgeon: Rubie Maid, MD;  Location: [facility] ORS;  Service: Gynecology;  Laterality: N/A;   vocal cord surgery  2005    Family History  Problem Relation Age of Onset   Depression Mother    Cancer Mother        thyroid   Alcohol abuse Father    Alcohol abuse Brother    Depression Brother    Bipolar disorder Brother    Suicidality Brother    ADD / ADHD Son    Breast cancer Neg Hx     Social History   Tobacco Use   Smoking status: Every Day    Packs/day: 1.00    Years: 31.00    Total pack years: 31.00    Types: Cigarettes    Start date: 09/25/1986   Smokeless tobacco: Never  Substance Use Topics   Alcohol use: Not Currently    Alcohol/week: 0.0 standard drinks of alcohol    Comment: rarely     Current Outpatient Medications:    ADVAIR DISKUS 250-50 MCG/ACT AEPB, INHALE 1 PUFF BY MOUTH INTO LUNGS TWICE DAILY, Disp: 180 each, Rfl: 0   ARIPiprazole (ABILIFY) 5 MG tablet, Take 1 tablet (5 mg total) by mouth daily., Disp: 90 tablet, Rfl: 0   Ascorbic  Acid (VITAMIN C) 1000 MG tablet, Take 1,000 mg by mouth daily as needed (immune health)., Disp: , Rfl:    ASPIRIN 81 PO, Take 81 mg by mouth daily., Disp: , Rfl:    atorvastatin (LIPITOR) 10 MG tablet, Take 1 tablet (10 mg total) by mouth in the morning., Disp: 90 tablet, Rfl: 0   Calcium Carbonate-Vit D-Min (GNP CALCIUM 1200) 1200-1000 MG-UNIT CHEW, Chew 1,200 mg by mouth daily with breakfast. Take in combination with vitamin D and magnesium. (Patient taking differently: Chew 2 tablets by mouth in the morning, at noon, and at bedtime. Take in combination with vitamin D and magnesium.), Disp: 90 tablet, Rfl: 3   Crisaborole 2 % OINT, Apply topically., Disp: , Rfl:    DULoxetine (CYMBALTA) 60 MG capsule, Take 60 mg by mouth daily., Disp: , Rfl:    estrogens, conjugated, (PREMARIN) 0.625 MG tablet,  Take 1 tablet (0.625 mg total) by mouth daily. Take daily for 21 days then do not take for 7 days., Disp: 90 tablet, Rfl: 3   hydrocortisone cream 1 %, Apply 1 Application topically 2 (two) times daily., Disp: 30 g, Rfl: 0   lenalidomide (REVLIMID) 10 MG capsule, Take 10 mg by mouth daily., Disp: , Rfl:    loratadine (CLARITIN) 10 MG tablet, TAKE ONE TABLET BY MOUTH EVERY MORNING, Disp: 90 tablet, Rfl: 0   Magnesium 500 MG CAPS, Take 500 mg by mouth in the morning., Disp: , Rfl:    meloxicam (MOBIC) 15 MG tablet, Take 1 tablet (15 mg total) by mouth daily., Disp: 90 tablet, Rfl: 1   mupirocin ointment (BACTROBAN) 2 %, Apply 1 Application topically 2 (two) times daily., Disp: 22 g, Rfl: 0   omeprazole (PRILOSEC) 20 MG capsule, Take 1 capsule (20 mg total) by mouth every morning., Disp: 90 capsule, Rfl: 3   ondansetron (ZOFRAN) 8 MG tablet, Take by mouth every 8 (eight) hours as needed., Disp: , Rfl:    Oxymetazoline HCl (RHOFADE) 1 % CREA, Qam to face, Disp: 30 g, Rfl: 11   pregabalin (LYRICA) 100 MG capsule, Take 100 mg by mouth 3 (three) times daily., Disp: , Rfl:    Ruxolitinib Phosphate 1.5 % CREA, Apply  topically., Disp: , Rfl:    tiZANidine (ZANAFLEX) 2 MG tablet, TAKE ONE TABLET by MOUTH THREE times daily, Disp: 30 tablet, Rfl: 0   vitamin B-12 (CYANOCOBALAMIN) 500 MCG tablet, Take 250 mcg by mouth every morning., Disp: , Rfl:   No Active Allergies  I personally reviewed active problem list, medication list, allergies, family history, social history, health maintenance with the patient/caregiver today.   ROS  ***  Objective  There were no vitals filed for this visit.  There is no height or weight on file to calculate BMI.  Physical Exam ***   PHQ2/9:    03/08/2022   10:35 AM 01/01/2022   10:28 AM 12/06/2021    2:53 PM 10/29/2021    9:27 AM 08/06/2021    9:21 AM  Depression screen PHQ 2/9  Decreased Interest 0 0 0 0 0  Down, Depressed, Hopeless 0 0 0 0 0  PHQ - 2 Score 0 0 0 0 0  Altered sleeping   0 0 0  Tired, decreased energy   0 0 0  Change in appetite   0 0 0  Feeling bad or failure about yourself    0 0 0  Trouble concentrating   0 0 0  Moving slowly or fidgety/restless   0 0 0  Suicidal thoughts   0 0 0  PHQ-9 Score   0 0 0  Difficult doing work/chores   Not difficult at all  Not difficult at all    phq 9 is {gen pos [id number]}   Fall Risk:    03/08/2022   10:35 AM 01/01/2022   10:28 AM 12/06/2021    2:53 PM 10/29/2021    9:27 AM 08/06/2021    9:21 AM  Fall Risk   Falls in the past year? 0 1 1 0 1  Number falls in past yr: 0 1 1 0 0  Injury with Fall? 0 1 1 0 0  Risk for fall due to :  History of fall(s) Impaired balance/gait No Fall Risks Impaired balance/gait  Follow up Falls evaluation completed Falls evaluation completed Education provided;Falls evaluation completed;Falls prevention discussed Falls prevention discussed Falls prevention discussed  Functional Status Survey:      Assessment & Plan  *** There are no diagnoses linked to this encounter.

## 2022-03-20 ENCOUNTER — Ambulatory Visit: Payer: Medicare Other | Admitting: Family Medicine

## 2022-03-21 ENCOUNTER — Inpatient Hospital Stay: Payer: Medicare Other

## 2022-03-21 ENCOUNTER — Encounter: Payer: Self-pay | Admitting: Oncology

## 2022-03-21 ENCOUNTER — Inpatient Hospital Stay (HOSPITAL_BASED_OUTPATIENT_CLINIC_OR_DEPARTMENT_OTHER): Payer: Medicare Other | Admitting: Oncology

## 2022-03-21 ENCOUNTER — Other Ambulatory Visit: Payer: Self-pay

## 2022-03-21 ENCOUNTER — Inpatient Hospital Stay: Payer: Medicare Other | Attending: Oncology

## 2022-03-21 VITALS — BP 95/56 | HR 80 | Temp 98.6°F | Resp 16 | Wt 175.9 lb

## 2022-03-21 VITALS — BP 112/63 | HR 64 | Resp 16

## 2022-03-21 DIAGNOSIS — Z79899 Other long term (current) drug therapy: Secondary | ICD-10-CM | POA: Diagnosis not present

## 2022-03-21 DIAGNOSIS — C9110 Chronic lymphocytic leukemia of B-cell type not having achieved remission: Secondary | ICD-10-CM | POA: Insufficient documentation

## 2022-03-21 DIAGNOSIS — Z5112 Encounter for antineoplastic immunotherapy: Secondary | ICD-10-CM | POA: Diagnosis present

## 2022-03-21 LAB — COMPREHENSIVE METABOLIC PANEL
ALT: 26 U/L (ref 0–44)
AST: 24 U/L (ref 15–41)
Albumin: 3.5 g/dL (ref 3.5–5.0)
Alkaline Phosphatase: 66 U/L (ref 38–126)
Anion gap: 7 (ref 5–15)
BUN: 22 mg/dL — ABNORMAL HIGH (ref 6–20)
CO2: 26 mmol/L (ref 22–32)
Calcium: 8.3 mg/dL — ABNORMAL LOW (ref 8.9–10.3)
Chloride: 104 mmol/L (ref 98–111)
Creatinine, Ser: 0.98 mg/dL (ref 0.44–1.00)
GFR, Estimated: >60 mL/min (ref >60)
Glucose, Bld: 112 mg/dL — ABNORMAL HIGH (ref 70–99)
Potassium: 4.3 mmol/L (ref 3.5–5.1)
Sodium: 137 mmol/L (ref 135–145)
Total Bilirubin: 0.7 mg/dL (ref 0.3–1.2)
Total Protein: 6.2 g/dL — ABNORMAL LOW (ref 6.5–8.1)

## 2022-03-21 LAB — CBC WITH DIFFERENTIAL/PLATELET
Abs Immature Granulocytes: 0.1 10*3/uL — ABNORMAL HIGH (ref 0.00–0.07)
Band Neutrophils: 2 %
Basophils Absolute: 0.1 10*3/uL (ref 0.0–0.1)
Basophils Relative: 1 %
Eosinophils Absolute: 0 10*3/uL (ref 0.0–0.5)
Eosinophils Relative: 0 %
HCT: 39.2 % (ref 36.0–46.0)
Hemoglobin: 13.1 g/dL (ref 12.0–15.0)
Lymphocytes Relative: 76 %
Lymphs Abs: 7.2 10*3/uL — ABNORMAL HIGH (ref 0.7–4.0)
MCH: 31.9 pg (ref 26.0–34.0)
MCHC: 33.4 g/dL (ref 30.0–36.0)
MCV: 95.4 fL (ref 80.0–100.0)
Monocytes Absolute: 0.2 10*3/uL (ref 0.1–1.0)
Monocytes Relative: 2 %
Myelocytes: 1 %
Neutro Abs: 1.9 10*3/uL (ref 1.7–7.7)
Neutrophils Relative %: 18 %
Platelets: 139 10*3/uL — ABNORMAL LOW (ref 150–400)
RBC: 4.11 MIL/uL (ref 3.87–5.11)
RDW: 14.6 % (ref 11.5–15.5)
Smear Review: NORMAL
WBC: 9.5 10*3/uL (ref 4.0–10.5)
nRBC: 0 % (ref 0.0–0.2)

## 2022-03-21 MED ORDER — SODIUM CHLORIDE 0.9 % IV SOLN
20.0000 mg | Freq: Once | INTRAVENOUS | Status: AC
  Administered 2022-03-21: 20 mg via INTRAVENOUS
  Filled 2022-03-21: qty 2

## 2022-03-21 MED ORDER — DIPHENHYDRAMINE HCL 50 MG/ML IJ SOLN
50.0000 mg | Freq: Once | INTRAMUSCULAR | Status: AC
  Administered 2022-03-21: 50 mg via INTRAVENOUS
  Filled 2022-03-21: qty 1

## 2022-03-21 MED ORDER — ACETAMINOPHEN 325 MG PO TABS
650.0000 mg | ORAL_TABLET | Freq: Once | ORAL | Status: AC
  Administered 2022-03-21: 650 mg via ORAL
  Filled 2022-03-21: qty 2

## 2022-03-21 MED ORDER — SODIUM CHLORIDE 0.9 % IV SOLN
375.0000 mg/m2 | Freq: Once | INTRAVENOUS | Status: AC
  Administered 2022-03-21: 700 mg via INTRAVENOUS
  Filled 2022-03-21: qty 50

## 2022-03-21 MED ORDER — FAMOTIDINE IN NACL 20-0.9 MG/50ML-% IV SOLN
20.0000 mg | Freq: Once | INTRAVENOUS | Status: AC
  Administered 2022-03-21: 20 mg via INTRAVENOUS
  Filled 2022-03-21: qty 50

## 2022-03-21 MED ORDER — SODIUM CHLORIDE 0.9 % IV SOLN
Freq: Once | INTRAVENOUS | Status: AC
  Filled 2022-03-21: qty 250

## 2022-03-21 NOTE — Progress Notes (Signed)
[place]  [phone] Fax:(336) [phone]  ID: Monique [name] OB: 1971/09/08  MR#: [id number]  XTA#:[id number]  Patient Care Team: [name], MD as PCP - General (Family Medicine) Lloyd Huger, MD as Consulting Physician (Oncology) Lane Hacker, [facility] as Pharmacist (Pharmacist) Greg Cutter, LCSW as [place] Management (Licensed Clinical Social Worker)  CHIEF COMPLAINT: CLL, iron deficiency.  INTERVAL HISTORY: Patient returns to clinic today for further evaluation and consideration of cycle 4 of weekly cisplatin.  She is tolerating her treatments well without significant side effects.  She does not complain of any further cramping today.  She has no neurologic complaints.  She denies any fevers or night sweats.  She has a good appetite and denies weight loss.  She has no chest pain, shortness of breath, cough, or hemoptysis.  She denies any nausea, vomiting, constipation, or diarrhea.  She has no melena or hematochezia.  She has no urinary complaints.  Patient offers no further specific complaints today.  REVIEW OF SYSTEMS:   Review of Systems  Constitutional:  Negative for diaphoresis, fever, malaise/fatigue and weight loss.  HENT: Negative.  Negative for congestion.   Respiratory: Negative.  Negative for cough and shortness of breath.   Cardiovascular: Negative.  Negative for chest pain and leg swelling.  Gastrointestinal:  Negative for abdominal pain and constipation.  Genitourinary: Negative.  Negative for dysuria and frequency.  Musculoskeletal: Negative.  Negative for back pain, myalgias and neck pain.  Skin: Negative.   Neurological: Negative.  Negative for tingling, sensory change, focal weakness and weakness.  Psychiatric/Behavioral: Negative.  Negative for depression and suicidal ideas. The patient is not nervous/anxious.     As per HPI. Otherwise, a complete review of systems is negative.  PAST  MEDICAL HISTORY: Past Medical History:  Diagnosis Date   Anxiety    Bursitis    leg pain   Carpal tunnel syndrome    Cervical dysplasia    hx LEEP over 18 years ago.    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  (in lymph nodes)   COPD (chronic obstructive pulmonary disease) ([place])    COVID-19 2021   Depression    Eating disorder    GERD (gastroesophageal reflux disease)    History of self-harm    Insomnia    Obsession    Tobacco abuse    Vitamin B12 deficiency (non anemic)     PAST SURGICAL HISTORY: Past Surgical History:  Procedure Laterality Date   BREAST BIOPSY Right 01/05/2018   US guided biopsy of 2 areas and 1 lymph node, MIXED INFLAMMATION AND GIANT CELL REACTION   CERVICAL BIOPSY  W/ LOOP ELECTRODE EXCISION     COLONOSCOPY WITH PROPOFOL N/A 04/21/2019   Procedure: COLONOSCOPY WITH PROPOFOL;  Surgeon: Virgel Manifold, MD;  Location: [facility] ENDOSCOPY;  Service: Gastroenterology;  Laterality: N/A;   OTHER SURGICAL HISTORY     scar tissue removed from vocal cords   TUBAL LIGATION     VAGINAL HYSTERECTOMY N/A 01/22/2021   Procedure: HYSTERECTOMY VAGINAL; BILATERAL SALPINGECTOMY;  Surgeon: Rubie Maid, MD;  Location: [facility] ORS;  Service: Gynecology;  Laterality: N/A;   vocal cord surgery  2005    FAMILY HISTORY: Family History  Problem Relation Age of Onset   Depression Mother    Cancer Mother        thyroid   Alcohol abuse Father    Alcohol abuse Brother    Depression Brother    Bipolar  disorder Brother    Suicidality Brother    ADD / ADHD Son    Breast cancer Neg Hx     ADVANCED DIRECTIVES (Y/N):  N  HEALTH MAINTENANCE: Social History   Tobacco Use   Smoking status: Every Day    Packs/day: 1.00    Years: 31.00    Total pack years: 31.00    Types: Cigarettes    Start date: 09/25/1986   Smokeless tobacco: Never  Vaping Use   Vaping Use: Former  Substance Use Topics   Alcohol use: Not Currently    Alcohol/week: 0.0 standard drinks of  alcohol    Comment: rarely   Drug use: Yes    Frequency: 7.0 times per week    Types: Marijuana     Colonoscopy:  PAP:  Bone density:  Lipid panel:  No Active Allergies  Current Outpatient Medications  Medication Sig Dispense Refill   ADVAIR DISKUS 250-50 MCG/ACT AEPB INHALE 1 PUFF BY MOUTH INTO LUNGS TWICE DAILY 180 each 0   ARIPiprazole (ABILIFY) 5 MG tablet Take 1 tablet (5 mg total) by mouth daily. 90 tablet 0   Ascorbic Acid (VITAMIN C) 1000 MG tablet Take 1,000 mg by mouth daily as needed (immune health).     ASPIRIN 81 PO Take 81 mg by mouth daily.     atorvastatin (LIPITOR) 10 MG tablet Take 1 tablet (10 mg total) by mouth in the morning. 90 tablet 0   Crisaborole 2 % OINT Apply topically.     DULoxetine (CYMBALTA) 60 MG capsule Take 60 mg by mouth daily.     estrogens, conjugated, (PREMARIN) 0.625 MG tablet Take 1 tablet (0.625 mg total) by mouth daily. Take daily for 21 days then do not take for 7 days. 90 tablet 3   hydrocortisone cream 1 % Apply 1 Application topically 2 (two) times daily. 30 g 0   lenalidomide (REVLIMID) 10 MG capsule Take 10 mg by mouth daily.     loratadine (CLARITIN) 10 MG tablet TAKE ONE TABLET BY MOUTH EVERY MORNING 90 tablet 0   Magnesium 500 MG CAPS Take 500 mg by mouth in the morning.     meloxicam (MOBIC) 15 MG tablet Take 1 tablet (15 mg total) by mouth daily. 90 tablet 1   mupirocin ointment (BACTROBAN) 2 % Apply 1 Application topically 2 (two) times daily. 22 g 0   omeprazole (PRILOSEC) 20 MG capsule Take 1 capsule (20 mg total) by mouth every morning. 90 capsule 3   ondansetron (ZOFRAN) 8 MG tablet Take by mouth every 8 (eight) hours as needed.     Oxymetazoline HCl (RHOFADE) 1 % CREA Qam to face 30 g 11   pregabalin (LYRICA) 100 MG capsule Take 100 mg by mouth 3 (three) times daily.     Ruxolitinib Phosphate 1.5 % CREA Apply topically.     tiZANidine (ZANAFLEX) 2 MG tablet TAKE ONE TABLET by MOUTH THREE times daily 30 tablet 0   vitamin  B-12 (CYANOCOBALAMIN) 500 MCG tablet Take 250 mcg by mouth every morning.     Calcium Carbonate-Vit D-Min (GNP CALCIUM 1200) 1200-1000 MG-UNIT CHEW Chew 1,200 mg by mouth daily with breakfast. Take in combination with vitamin D and magnesium. (Patient taking differently: Chew 2 tablets by mouth in the morning, at noon, and at bedtime. Take in combination with vitamin D and magnesium.) 90 tablet 3   No current facility-administered medications for this visit.    OBJECTIVE: Vitals:   03/21/22 0849  BP: (!) 95/56  Pulse: 80  Resp: 16  Temp: 98.6 F (37 C)  SpO2: 98%      Body mass index is 27.55 kg/m.    ECOG FS:0 - Asymptomatic  General: Well-developed, well-nourished, no acute distress. Eyes: Pink conjunctiva, anicteric sclera. HEENT: Normocephalic, moist mucous membranes. Lungs: No audible wheezing or coughing. Heart: Regular rate and rhythm. Abdomen: Soft, nontender, no obvious distention. Musculoskeletal: No edema, cyanosis, or clubbing. Neuro: Alert, answering all questions appropriately. Cranial nerves grossly intact. Skin: No rashes or petechiae noted. Psych: Normal affect. Lymphatics: No palpable lymphadenopathy.  LAB RESULTS:  Lab Results  Component Value Date   NA 137 03/21/2022   K 4.3 03/21/2022   CL 104 03/21/2022   CO2 26 03/21/2022   GLUCOSE 112 (H) 03/21/2022   BUN 22 (H) 03/21/2022   CREATININE 0.98 03/21/2022   CALCIUM 8.3 (L) 03/21/2022   PROT 6.2 (L) 03/21/2022   ALBUMIN 3.5 03/21/2022   AST 24 03/21/2022   ALT 26 03/21/2022   ALKPHOS 66 03/21/2022   BILITOT 0.7 03/21/2022   GFRNONAA >60 03/21/2022   GFRAA 79 12/06/2020    Lab Results  Component Value Date   WBC 9.5 03/21/2022   NEUTROABS 1.9 03/21/2022   HGB 13.1 03/21/2022   HCT 39.2 03/21/2022   MCV 95.4 03/21/2022   PLT 139 (L) 03/21/2022     STUDIES: No results found.  ASSESSMENT: CLL,  iron deficiency.  PLAN:    1. CLL: Confirmed by peripheral blood flow cytometry.  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 Patient completed cycle 3 of Rituxan plus Treanda on March 26, 2018, but was noted to have progressive disease.  She was then initiated on 480 mg Imbruvica in November 2019, but now appears to have progression of disease with increasing white blood cell count as well as CT scan on Jan 22, 2022 revealing increased lymphadenopathy consistent with progression of disease.  Attempted patient on venetoclax, but she could not tolerate it secondary to persistent diarrhea and transaminitis.  Her symptoms have now resolved.  Proceed with cycle 4 of 4 of weekly Rituxan today.  Continue 10 mg Revlimid daily.  If patient does not have a durable response, can consider rechallenging with venetoclax.  Return to clinic in 4 weeks for further evaluation and consideration of cycle 5 of Rituxan. Appreciate clinical pharmacy input. 2.  Leukocytosis: Resolved.  Secondary to CLL.  Treatment as above.   3.  Trigeminal neuralgia: Patient does not complain of this today.  Appears to be unrelated to CLL or Imbruvica.  MRI of the brain on Jan 25, 2019 was unremarkable.   4.  Depression/anxiety: Chronic.  Continue follow-up and treatment per primary care. 5.  Neuropathic pain/peripheral neuropathy: Chronic and unchanged.  Patient reports she is now on disability.  Continue current follow-up and treatment as per neurology. 6.  Iron deficiency: Resolved.   7.  Rituxan reaction: Patient has been given additional premedications for preventative measures.  She did not have a reaction during cycle 2 or 3. 8.  Thrombocytopenia: Platelets continue to trend up.   Patient expressed understanding and was in agreement with this plan. She also understands that She can call clinic at any time with any questions, concerns, or complaints.    Lloyd Huger, MD   03/21/2022 1:24 PM

## 2022-03-21 NOTE — Patient Instructions (Signed)
[facility] [place]  Discharge Instructions: Thank you for choosing [facility] to provide your oncology and hematology care.  If you have a lab appointment with the Cancer Center, please go directly to the Cancer Center and check in at the registration area.  Wear comfortable clothing and clothing appropriate for easy access to any Portacath or PICC line.   We strive to give you quality time with your provider. You may need to reschedule your appointment if you arrive late (15 or more minutes).  Arriving late affects you and other patients whose appointments are after yours.  Also, if you miss three or more appointments without notifying the office, you may be dismissed from the clinic at the provider's discretion.      For prescription refill requests, have your pharmacy contact our office and allow 72 hours for refills to be completed.    Today you received the following chemotherapy and/or immunotherapy agents: Ruxience      To help prevent nausea and vomiting after your treatment, we encourage you to take your nausea medication as directed.  BELOW ARE SYMPTOMS THAT SHOULD BE REPORTED IMMEDIATELY: *FEVER GREATER THAN 100.4 F (38 C) OR HIGHER *CHILLS OR SWEATING *NAUSEA AND VOMITING THAT IS NOT CONTROLLED WITH YOUR NAUSEA MEDICATION *UNUSUAL SHORTNESS OF BREATH *UNUSUAL BRUISING OR BLEEDING *URINARY PROBLEMS (pain or burning when urinating, or frequent urination) *BOWEL PROBLEMS (unusual diarrhea, constipation, pain near the anus) TENDERNESS IN MOUTH AND THROAT WITH OR WITHOUT PRESENCE OF ULCERS (sore throat, sores in mouth, or a toothache) UNUSUAL RASH, SWELLING OR PAIN  UNUSUAL VAGINAL DISCHARGE OR ITCHING   Items with * indicate a potential emergency and should be followed up as soon as possible or go to the Emergency Department if any problems should occur.  Please show the CHEMOTHERAPY ALERT CARD or IMMUNOTHERAPY ALERT CARD at check-in to  the Emergency Department and triage nurse.  Should you have questions after your visit or need to cancel or reschedule your appointment, please contact [facility] [place]  [phone] and follow the prompts.  Office hours are 8:00 a.m. to 4:30 p.m. Monday - Friday. Please note that voicemails left after 4:00 p.m. may not be returned until the following business day.  We are closed weekends and major holidays. You have access to a nurse at all times for urgent questions. Please call the main number to the clinic [phone] and follow the prompts.  For any non-urgent questions, you may also contact your provider using MyChart. We now offer e-Visits for anyone 18 and older to request care online for non-urgent symptoms. For details visit [email or web address].   Also download the MyChart app! Go to the app store, search "MyChart", open the app, select [facility] , and log in with your MyChart username and password.  Masks are optional in the cancer centers. If you would like for your care team to wear a mask while they are taking care of you, please let them know. For doctor visits, patients may have with them one support person who is at least 53 years old. At this time, visitors are not allowed in the infusion area.   

## 2022-03-21 NOTE — Progress Notes (Signed)
Pt states that last night she vomitted aout 3-5x along with diarrhea; not sure if it is a side effect from tx. Along with constantly fatigued and tired will fall asleep very quickly.

## 2022-03-22 ENCOUNTER — Other Ambulatory Visit: Payer: Self-pay | Admitting: Pharmacist

## 2022-03-22 DIAGNOSIS — C9110 Chronic lymphocytic leukemia of B-cell type not having achieved remission: Secondary | ICD-10-CM

## 2022-03-22 MED ORDER — LENALIDOMIDE 10 MG PO CAPS: 10.0000 mg | ORAL_CAPSULE | Freq: Every day | ORAL | 0 refills | Status: DC

## 2022-03-25 ENCOUNTER — Other Ambulatory Visit: Payer: Self-pay | Admitting: Family Medicine

## 2022-03-25 ENCOUNTER — Other Ambulatory Visit: Payer: Self-pay | Admitting: Oncology

## 2022-03-25 DIAGNOSIS — G8929 Other chronic pain: Secondary | ICD-10-CM

## 2022-03-25 DIAGNOSIS — C9110 Chronic lymphocytic leukemia of B-cell type not having achieved remission: Secondary | ICD-10-CM

## 2022-03-26 ENCOUNTER — Encounter: Payer: Self-pay | Admitting: Oncology

## 2022-04-03 ENCOUNTER — Telehealth: Payer: Self-pay | Admitting: Oncology

## 2022-04-03 ENCOUNTER — Inpatient Hospital Stay (HOSPITAL_BASED_OUTPATIENT_CLINIC_OR_DEPARTMENT_OTHER): Payer: Medicare Other | Admitting: Hospice and Palliative Medicine

## 2022-04-03 ENCOUNTER — Other Ambulatory Visit: Payer: Self-pay

## 2022-04-03 VITALS — BP 94/69 | HR 78 | Temp 98.2°F | Resp 18 | Ht 67.0 in | Wt 176.0 lb

## 2022-04-03 DIAGNOSIS — Z5112 Encounter for antineoplastic immunotherapy: Secondary | ICD-10-CM | POA: Diagnosis not present

## 2022-04-03 DIAGNOSIS — R21 Rash and other nonspecific skin eruption: Secondary | ICD-10-CM | POA: Diagnosis not present

## 2022-04-03 MED ORDER — PREDNISONE 10 MG PO TABS: ORAL_TABLET | ORAL | 0 refills | Status: DC

## 2022-04-03 NOTE — Progress Notes (Signed)
Symptom Management [place] at [facility] Telephone:(336) [phone] Fax:(336) [phone]  Patient Care Team: [name], MD as PCP - General (Family Medicine) Lloyd Huger, MD as Consulting Physician (Oncology) Lane Hacker, [facility] as Pharmacist (Pharmacist) Greg Cutter, LCSW as [place] Management (Licensed Clinical Social Worker) Gloris Ham, RN as Registered Nurse (Oncology) Azaria Stegman, Kirt Boys, NP as Nurse Practitioner [facility] and Palliative Medicine)   Name of the patient: Monique [name]  [id number]  [birth year]   Date of visit: 04/03/22  Reason for Consult: Monique [name] is a 51 y.o. female with multiple medical problems including CLL, iron deficiency anemia most recently completed cycle 4 for weekly Rituxan and is now on Revlimid.  Patient started Revlimid several weeks ago.  She reports about a week or 2 ago she developed a red, confluent pruritic rash on her right leg that is now on her left leg as well.  No rashes anywhere else.  No reported history of rashes or allergies.  She denies fever or chills.  No other symptomatic complaints or concerns.  Denies any neurologic complaints. Denies recent fevers or illnesses. Denies any easy bleeding or bruising. Reports good appetite and denies weight loss. Denies chest pain. Denies any nausea, vomiting, constipation, or diarrhea. Denies urinary complaints. Patient offers no further specific complaints today.  PAST MEDICAL HISTORY: Past Medical History:  Diagnosis Date   Anxiety    Bursitis    leg pain   Carpal tunnel syndrome    Cervical dysplasia    hx LEEP over 18 years ago.    Chronic lymphocytic leukemia ([place]) 2018   Dr Monique [name].  (in lymph nodes)   COPD (chronic obstructive pulmonary disease) ([place])    COVID-19 2021   Depression    Eating disorder    GERD (gastroesophageal reflux disease)    History of self-harm    Insomnia    Obsession     Tobacco abuse    Vitamin B12 deficiency (non anemic)     PAST SURGICAL HISTORY:  Past Surgical History:  Procedure Laterality Date   BREAST BIOPSY Right 01/05/2018   US guided biopsy of 2 areas and 1 lymph node, MIXED INFLAMMATION AND GIANT CELL REACTION   CERVICAL BIOPSY  W/ LOOP ELECTRODE EXCISION     COLONOSCOPY WITH PROPOFOL N/A 04/21/2019   Procedure: COLONOSCOPY WITH PROPOFOL;  Surgeon: Virgel Manifold, MD;  Location: [facility] ENDOSCOPY;  Service: Gastroenterology;  Laterality: N/A;   OTHER SURGICAL HISTORY     scar tissue removed from vocal cords   TUBAL LIGATION     VAGINAL HYSTERECTOMY N/A 01/22/2021   Procedure: HYSTERECTOMY VAGINAL; BILATERAL SALPINGECTOMY;  Surgeon: Rubie Maid, MD;  Location: [facility] ORS;  Service: Gynecology;  Laterality: N/A;   vocal cord surgery  2005    HEMATOLOGY/ONCOLOGY HISTORY:  Oncology History Overview Note  Patient with recent CLL diagnosis by flow cytometry.  Previously not being treated in observation.   S/p 4 cycles of weekly Rituxan completing on 02/20/2017 scans from January 2018 revealed minimal progression.   Developed right breast mass in March 2019.  Had diagnostic mammogram performed on 12/26/2017 showed suspicious right breast mass.  Biopsy revealed granulomatous mastitis.  She was treated with doxycycline 100 mg twice daily for 7 days.   Developed worsening adenopathy.  CT chest/abdomen/pelvis and soft tissue neck from May 2019 revealed progressive bulky adenopathy in the chest, abdomen and pelvis compatible with worsening disease.   Dr. Grayland [name] recommended beginning treatment  with Rituxan and Treanda.  Received first cycle last week.  S/p 6 cycles of Rituxan and Treanda.  Tolerating well.  Last dose given 03/25/18.   CLL (chronic lymphocytic leukemia) ([place])  11/24/2016 Initial Diagnosis   CLL (chronic lymphocytic leukemia) ([place])   01/21/2018 - 03/26/2018 Chemotherapy   The patient had palonosetron (ALOXI) injection 0.25 mg, 0.25 mg,  Intravenous,  Once, 4 of 5 cycles Administration: 0.25 mg (01/28/2018), 0.25 mg (02/25/2018), 0.25 mg (03/25/2018) riTUXimab (RITUXAN) 700 mg in sodium chloride 0.9 % 250 mL (2.1875 mg/mL) infusion, 375 mg/m2 = 700 mg, Intravenous,  Once, 4 of 5 cycles Administration: 700 mg (01/28/2018) bendamustine (BENDEKA) 150 mg in sodium chloride 0.9 % 50 mL (2.6786 mg/mL) chemo infusion, 90 mg/m2 = 150 mg, Intravenous,  Once, 4 of 5 cycles Administration: 150 mg (01/28/2018), 150 mg (01/29/2018), 150 mg (02/25/2018), 150 mg (02/26/2018), 150 mg (03/25/2018), 150 mg (03/26/2018)  for chemotherapy treatment.    02/28/2022 -  Chemotherapy   Patient is on Treatment Plan : NON-HODGKINS LYMPHOMA Rituximab q7d     03/12/2022 - 03/12/2022 Chemotherapy   Patient is on Treatment Plan : LYMPHOMA CLL/SLL Venetoclax ramp up / Venetoclax + Rituximab (375/500) q28d x 6 cycles / Venetoclax q28d x 18 cycles       ALLERGIES:  has no active allergies.  MEDICATIONS:  Current Outpatient Medications  Medication Sig Dispense Refill   ADVAIR DISKUS 250-50 MCG/ACT AEPB INHALE 1 PUFF BY MOUTH INTO LUNGS TWICE DAILY 180 each 0   ARIPiprazole (ABILIFY) 5 MG tablet Take 1 tablet (5 mg total) by mouth daily. 90 tablet 0   Ascorbic Acid (VITAMIN C) 1000 MG tablet Take 1,000 mg by mouth daily as needed (immune health).     ASPIRIN 81 PO Take 81 mg by mouth daily.     atorvastatin (LIPITOR) 10 MG tablet Take 1 tablet (10 mg total) by mouth in the morning. 90 tablet 0   Crisaborole 2 % OINT Apply topically.     DULoxetine (CYMBALTA) 60 MG capsule Take 60 mg by mouth daily.     estrogens, conjugated, (PREMARIN) 0.625 MG tablet Take 1 tablet (0.625 mg total) by mouth daily. Take daily for 21 days then do not take for 7 days. 90 tablet 3   hydrocortisone cream 1 % Apply 1 Application topically 2 (two) times daily. 30 g 0   lenalidomide (REVLIMID) 10 MG capsule Take 1 capsule (10 mg total) by mouth daily. 28 capsule 0   loratadine (CLARITIN) 10  MG tablet TAKE ONE TABLET BY MOUTH EVERY MORNING 90 tablet 0   Magnesium 500 MG CAPS Take 500 mg by mouth in the morning.     meloxicam (MOBIC) 15 MG tablet Take 1 tablet (15 mg total) by mouth daily. 90 tablet 1   mupirocin ointment (BACTROBAN) 2 % Apply 1 Application topically 2 (two) times daily. 22 g 0   omeprazole (PRILOSEC) 20 MG capsule Take 1 capsule (20 mg total) by mouth every morning. 90 capsule 3   ondansetron (ZOFRAN) 8 MG tablet Take by mouth every 8 (eight) hours as needed.     Oxymetazoline HCl (RHOFADE) 1 % CREA Qam to face 30 g 11   pregabalin (LYRICA) 100 MG capsule Take 100 mg by mouth 3 (three) times daily.     Ruxolitinib Phosphate 1.5 % CREA Apply topically.     tiZANidine (ZANAFLEX) 2 MG tablet TAKE ONE TABLET by MOUTH THREE times daily 30 tablet 0   vitamin B-12 (CYANOCOBALAMIN) 500  MCG tablet Take 250 mcg by mouth every morning.     Calcium Carbonate-Vit D-Min (GNP CALCIUM 1200) 1200-1000 MG-UNIT CHEW Chew 1,200 mg by mouth daily with breakfast. Take in combination with vitamin D and magnesium. (Patient taking differently: Chew 2 tablets by mouth in the morning, at noon, and at bedtime. Take in combination with vitamin D and magnesium.) 90 tablet 3   No current facility-administered medications for this visit.    VITAL SIGNS: BP 94/69   Pulse 78   Temp 98.2 F (36.8 C) (Tympanic)   Resp 18   Ht '5\' 7"'$  (1.702 m)   Wt 176 lb (79.8 kg)   LMP 08/17/2016   BMI 27.57 kg/m  Filed Weights   04/03/22 1302  Weight: 176 lb (79.8 kg)    Estimated body mass index is 27.57 kg/m as calculated from the following:   Height as of this encounter: '5\' 7"'$  (1.702 m).   Weight as of this encounter: 176 lb (79.8 kg).  LABS: CBC:    Component Value Date/Time   WBC 9.5 03/21/2022 0835   HGB 13.1 03/21/2022 0835   HCT 39.2 03/21/2022 0835   PLT 139 (L) 03/21/2022 0835   MCV 95.4 03/21/2022 0835   NEUTROABS 1.9 03/21/2022 0835   LYMPHSABS 7.2 (H) 03/21/2022 0835   MONOABS  0.2 03/21/2022 0835   EOSABS 0.0 03/21/2022 0835   BASOSABS 0.1 03/21/2022 0835   Comprehensive Metabolic Panel:    Component Value Date/Time   NA 137 03/21/2022 0835   NA 138 05/13/2018 1413   K 4.3 03/21/2022 0835   CL 104 03/21/2022 0835   CO2 26 03/21/2022 0835   BUN 22 (H) 03/21/2022 0835   BUN 15 05/13/2018 1413   CREATININE 0.98 03/21/2022 0835   CREATININE 0.96 12/06/2020 1646   GLUCOSE 112 (H) 03/21/2022 0835   CALCIUM 8.3 (L) 03/21/2022 0835   AST 24 03/21/2022 0835   ALT 26 03/21/2022 0835   ALKPHOS 66 03/21/2022 0835   BILITOT 0.7 03/21/2022 0835   BILITOT 0.4 05/13/2018 1413   PROT 6.2 (L) 03/21/2022 0835   PROT 6.3 05/13/2018 1413   ALBUMIN 3.5 03/21/2022 0835   ALBUMIN 4.1 05/13/2018 1413    RADIOGRAPHIC STUDIES: No results found.  PERFORMANCE STATUS (ECOG) : 0 - Asymptomatic  Review of Systems Unless otherwise noted, a complete review of systems is negative.  Physical Exam General: NAD Cardiovascular: regular rate and rhythm Pulmonary: clear ant fields Abdomen: soft, nontender, + bowel sounds GU: no suprapubic tenderness Extremities: no edema, no joint deformities Skin: Erythematous, nonpurulent, confluent rash to right thigh/leg (see images) Neurological: Nonfocal     Assessment and Plan- Patient is a 51 y.o. female with multiple medical problems including CLL, iron deficiency anemia most recently completed cycle 4 for weekly Rituxan and is now on Revlimid.   Rash -appears drug-related, likely Revlimid given timing of onset.  Discussed with Alyson, PharmD and will hold Revlimid 1 week.  Start prednisone taper over 1 week.  Of note, patient is chronically on loratadine but ran out about the time the rash started.  I recommended that she restart loratadine daily.  We will plan to have patient follow-up next week for recheck.   Patient expressed understanding and was in agreement with this plan. She also understands that She can call clinic at any  time with any questions, concerns, or complaints.   Thank you for allowing me to participate in the care of this very pleasant patient.   Time  Total: 15 minutes  Visit consisted of counseling and education dealing with the complex and emotionally intense issues of symptom management in the setting of serious illness.Greater than 50%  of this time was spent counseling and coordinating care related to the above assessment and plan.  Signed by: Altha Harm, PhD, NP-C

## 2022-04-03 NOTE — Progress Notes (Signed)
Patient presents to clinic with c/o 'worsening flat itchy rash/skin discoloration.' The rash started around 7/11 and continues to spread. Patient concerned that the revlimid is causing the rash.

## 2022-04-03 NOTE — Telephone Encounter (Signed)
Patient called and stated she spoke to [facility] and thinks she is having an allergic reaction to her medication. She states she needs to be seen.   Please advise.

## 2022-04-03 NOTE — Telephone Encounter (Signed)
Pt contacted and appointment scheduled in symptom management.

## 2022-04-04 ENCOUNTER — Telehealth: Payer: Self-pay | Admitting: *Deleted

## 2022-04-04 NOTE — Telephone Encounter (Signed)
Contacted patient s/p smc visit to follow-up on pt's rash. Pt did not get the prednisone picked up until last night and only took the first dose this morning. She understands to reach out to our office should she have any additional concerns or the rash worsens. She was instructed to keep all follow-ups as planned.

## 2022-04-08 ENCOUNTER — Other Ambulatory Visit: Payer: Self-pay

## 2022-04-10 ENCOUNTER — Inpatient Hospital Stay (HOSPITAL_BASED_OUTPATIENT_CLINIC_OR_DEPARTMENT_OTHER): Payer: Medicare Other | Admitting: Hospice and Palliative Medicine

## 2022-04-10 ENCOUNTER — Encounter: Payer: Self-pay | Admitting: Hospice and Palliative Medicine

## 2022-04-10 ENCOUNTER — Other Ambulatory Visit: Payer: Self-pay

## 2022-04-10 VITALS — BP 98/81 | HR 73 | Temp 98.7°F | Ht 67.0 in

## 2022-04-10 DIAGNOSIS — R21 Rash and other nonspecific skin eruption: Secondary | ICD-10-CM | POA: Diagnosis not present

## 2022-04-10 DIAGNOSIS — Z5112 Encounter for antineoplastic immunotherapy: Secondary | ICD-10-CM | POA: Diagnosis not present

## 2022-04-10 NOTE — Progress Notes (Signed)
Symptom Management [place] at [facility] [phone] [phone]  Patient Care Team: [name], MD as PCP - General (Family Medicine) Lloyd Huger, MD as Consulting Physician (Oncology) Lane Hacker, [facility] as Pharmacist (Pharmacist) Greg Cutter, LCSW as [place] Management (Licensed Clinical Social Worker) Gloris Ham, RN as Registered Nurse (Oncology) Latera Mclin, Kirt Boys, NP as Nurse Practitioner [facility] and Palliative Medicine)   Name of the patient: Monique [name]  [id number]  1971-08-21   Date of visit: 04/10/22  Reason for Consult: Asuzena Weis is a 51 y.o. female with multiple medical problems including CLL, iron deficiency anemia most recently completed cycle 4 for weekly Rituxan and is now on Revlimid.  Patient was seen in [facility] on 04/03/2022 with a confluent, pruritic rash on her legs after starting Revlimid.  Revlimid was held for 1 week and patient was started on prednisone taper.  Additionally, she was restarted on loratadine.  Patient presents to [facility] today for follow-up.  Today, patient says that she is feeling much better.  She feels like the rash has resolved.  She is not having pruritus or other symptoms.  Patient completed steroids yesterday.  She is taking daily loratadine.  Denies any neurologic complaints. Denies recent fevers or illnesses. Denies any easy bleeding or bruising. Reports good appetite and denies weight loss. Denies chest pain. Denies any nausea, vomiting, constipation, or diarrhea. Denies urinary complaints. Patient offers no further specific complaints today.  PAST MEDICAL HISTORY: Past Medical History:  Diagnosis Date   Anxiety    Bursitis    leg pain   Carpal tunnel syndrome    Cervical dysplasia    hx LEEP over 18 years ago.    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  (in lymph nodes)   COPD (chronic obstructive pulmonary  disease) ([place])    COVID-19 2021   Depression    Eating disorder    GERD (gastroesophageal reflux disease)    History of self-harm    Insomnia    Obsession    Tobacco abuse    Vitamin B12 deficiency (non anemic)     PAST SURGICAL HISTORY:  Past Surgical History:  Procedure Laterality Date   BREAST BIOPSY Right 01/05/2018   US guided biopsy of 2 areas and 1 lymph node, MIXED INFLAMMATION AND GIANT CELL REACTION   CERVICAL BIOPSY  W/ LOOP ELECTRODE EXCISION     COLONOSCOPY WITH PROPOFOL N/A 04/21/2019   Procedure: COLONOSCOPY WITH PROPOFOL;  Surgeon: Virgel Manifold, MD;  Location: [facility] ENDOSCOPY;  Service: Gastroenterology;  Laterality: N/A;   OTHER SURGICAL HISTORY     scar tissue removed from vocal cords   TUBAL LIGATION     VAGINAL HYSTERECTOMY N/A 01/22/2021   Procedure: HYSTERECTOMY VAGINAL; BILATERAL SALPINGECTOMY;  Surgeon: Rubie Maid, MD;  Location: [facility] ORS;  Service: Gynecology;  Laterality: N/A;   vocal cord surgery  2005    HEMATOLOGY/ONCOLOGY HISTORY:  Oncology History Overview Note  Patient with recent CLL diagnosis by flow cytometry.  Previously not being treated in observation.   S/p 4 cycles of weekly Rituxan completing on 02/20/2017 scans from January 2018 revealed minimal progression.   Developed right breast mass in March 2019.  Had diagnostic mammogram performed on 12/26/2017 showed suspicious right breast mass.  Biopsy revealed granulomatous mastitis.  She was treated with doxycycline 100 mg twice daily for 7 days.   Developed worsening adenopathy.  CT chest/abdomen/pelvis and soft tissue neck  from May 2019 revealed progressive bulky adenopathy in the chest, abdomen and pelvis compatible with worsening disease.   [name] recommended beginning treatment with Rituxan and Treanda.  Received first cycle last week.  S/p 6 cycles of Rituxan and Treanda.  Tolerating well.  Last dose given 03/25/18.   CLL (chronic lymphocytic leukemia) ([place])  11/24/2016  Initial Diagnosis   CLL (chronic lymphocytic leukemia) ([place])   01/21/2018 - 03/26/2018 Chemotherapy   The patient had palonosetron (ALOXI) injection 0.25 mg, 0.25 mg, Intravenous,  Once, 4 of 5 cycles Administration: 0.25 mg (01/28/2018), 0.25 mg (02/25/2018), 0.25 mg (03/25/2018) riTUXimab (RITUXAN) 700 mg in sodium chloride 0.9 % 250 mL (2.1875 mg/mL) infusion, 375 mg/m2 = 700 mg, Intravenous,  Once, 4 of 5 cycles Administration: 700 mg (01/28/2018) bendamustine (BENDEKA) 150 mg in sodium chloride 0.9 % 50 mL (2.6786 mg/mL) chemo infusion, 90 mg/m2 = 150 mg, Intravenous,  Once, 4 of 5 cycles Administration: 150 mg (01/28/2018), 150 mg (01/29/2018), 150 mg (02/25/2018), 150 mg (02/26/2018), 150 mg (03/25/2018), 150 mg (03/26/2018)  for chemotherapy treatment.    02/28/2022 -  Chemotherapy   Patient is on Treatment Plan : NON-HODGKINS LYMPHOMA Rituximab q7d     03/12/2022 - 03/12/2022 Chemotherapy   Patient is on Treatment Plan : LYMPHOMA CLL/SLL Venetoclax ramp up / Venetoclax + Rituximab (375/500) q28d x 6 cycles / Venetoclax q28d x 18 cycles       ALLERGIES:  has no active allergies.  MEDICATIONS:  Current Outpatient Medications  Medication Sig Dispense Refill   ADVAIR DISKUS 250-50 MCG/ACT AEPB INHALE 1 PUFF BY MOUTH INTO LUNGS TWICE DAILY 180 each 0   ARIPiprazole (ABILIFY) 5 MG tablet Take 1 tablet (5 mg total) by mouth daily. 90 tablet 0   Ascorbic Acid (VITAMIN C) 1000 MG tablet Take 1,000 mg by mouth daily as needed (immune health).     ASPIRIN 81 PO Take 81 mg by mouth daily.     atorvastatin (LIPITOR) 10 MG tablet Take 1 tablet (10 mg total) by mouth in the morning. 90 tablet 0   Calcium Carbonate-Vit D-Min (GNP CALCIUM 1200) 1200-1000 MG-UNIT CHEW Chew 1,200 mg by mouth daily with breakfast. Take in combination with vitamin D and magnesium. (Patient taking differently: Chew 2 tablets by mouth in the morning, at noon, and at bedtime. Take in combination with vitamin D and magnesium.) 90  tablet 3   Crisaborole 2 % OINT Apply topically.     DULoxetine (CYMBALTA) 60 MG capsule Take 60 mg by mouth daily.     estrogens, conjugated, (PREMARIN) 0.625 MG tablet Take 1 tablet (0.625 mg total) by mouth daily. Take daily for 21 days then do not take for 7 days. 90 tablet 3   hydrocortisone cream 1 % Apply 1 Application topically 2 (two) times daily. 30 g 0   loratadine (CLARITIN) 10 MG tablet TAKE ONE TABLET BY MOUTH EVERY MORNING 90 tablet 0   Magnesium 500 MG CAPS Take 500 mg by mouth in the morning.     meloxicam (MOBIC) 15 MG tablet Take 1 tablet (15 mg total) by mouth daily. 90 tablet 1   mupirocin ointment (BACTROBAN) 2 % Apply 1 Application topically 2 (two) times daily. 22 g 0   omeprazole (PRILOSEC) 20 MG capsule Take 1 capsule (20 mg total) by mouth every morning. 90 capsule 3   ondansetron (ZOFRAN) 8 MG tablet Take by mouth every 8 (eight) hours as needed.     Oxymetazoline HCl (RHOFADE) 1 % CREA  Qam to face 30 g 11   predniSONE (DELTASONE) 10 MG tablet Take 6 tablets (60 mg) x1 day, then take 5 tablets (50 mg) x1 day, then take 4 tablets (40 mg) x1 day, then take 3 tablets (30 mg) x1 day, then take 2 tablets (20 mg) x1 day, then take 1 tablet (10 mg) x1 day, then stop 21 tablet 0   pregabalin (LYRICA) 100 MG capsule Take 100 mg by mouth 3 (three) times daily.     Ruxolitinib Phosphate 1.5 % CREA Apply topically.     tiZANidine (ZANAFLEX) 2 MG tablet TAKE ONE TABLET by MOUTH THREE times daily 30 tablet 0   vitamin B-12 (CYANOCOBALAMIN) 500 MCG tablet Take 250 mcg by mouth every morning.     lenalidomide (REVLIMID) 10 MG capsule Take 1 capsule (10 mg total) by mouth daily. (Patient not taking: Reported on 04/10/2022) 28 capsule 0   No current facility-administered medications for this visit.    VITAL SIGNS: BP 98/81   Pulse 73   Temp 98.7 F (37.1 C) (Tympanic)   Ht '5\' 7"'$  (1.702 m)   LMP 08/17/2016   BMI 27.57 kg/m  Filed Weights    Estimated body mass index is 27.57  kg/m as calculated from the following:   Height as of this encounter: '5\' 7"'$  (1.702 m).   Weight as of 04/03/22: 176 lb (79.8 kg).  LABS: CBC:    Component Value Date/Time   WBC 9.5 03/21/2022 0835   HGB 13.1 03/21/2022 0835   HCT 39.2 03/21/2022 0835   PLT 139 (L) 03/21/2022 0835   MCV 95.4 03/21/2022 0835   NEUTROABS 1.9 03/21/2022 0835   LYMPHSABS 7.2 (H) 03/21/2022 0835   MONOABS 0.2 03/21/2022 0835   EOSABS 0.0 03/21/2022 0835   BASOSABS 0.1 03/21/2022 0835   Comprehensive Metabolic Panel:    Component Value Date/Time   NA 137 03/21/2022 0835   NA 138 05/13/2018 1413   K 4.3 03/21/2022 0835   CL 104 03/21/2022 0835   CO2 26 03/21/2022 0835   BUN 22 (H) 03/21/2022 0835   BUN 15 05/13/2018 1413   CREATININE 0.98 03/21/2022 0835   CREATININE 0.96 12/06/2020 1646   GLUCOSE 112 (H) 03/21/2022 0835   CALCIUM 8.3 (L) 03/21/2022 0835   AST 24 03/21/2022 0835   ALT 26 03/21/2022 0835   ALKPHOS 66 03/21/2022 0835   BILITOT 0.7 03/21/2022 0835   BILITOT 0.4 05/13/2018 1413   PROT 6.2 (L) 03/21/2022 0835   PROT 6.3 05/13/2018 1413   ALBUMIN 3.5 03/21/2022 0835   ALBUMIN 4.1 05/13/2018 1413    RADIOGRAPHIC STUDIES: No results found.  PERFORMANCE STATUS (ECOG) : 0 - Asymptomatic  Review of Systems Unless otherwise noted, a complete review of systems is negative.  Physical Exam General: NAD Pulmonary: unlabored Extremities: no edema, no joint deformities Skin:  (see images) Neurological: Nonfocal      Assessment and Plan- Patient is a 51 y.o. female with multiple medical problems including CLL, iron deficiency anemia most recently completed cycle 4 for weekly Rituxan and is now on Revlimid.   Rash -appears to have resolved.  Discussed with [name] and Clearnce Sorrel, PharmD.  Patient advised to restart Revlimid and continue daily loratadine.  Patient will follow-up next week in clinic as previously scheduled.  If rash returns, can potentially consider low-dose  prednisone daily.   Patient expressed understanding and was in agreement with this plan. She also understands that She can call clinic at any time with  any questions, concerns, or complaints.   Thank you for allowing me to participate in the care of this very pleasant patient.   Time Total: 15 minutes  Visit consisted of counseling and education dealing with the complex and emotionally intense issues of symptom management in the setting of serious illness.Greater than 50%  of this time was spent counseling and coordinating care related to the above assessment and plan.  Signed by: Altha Harm, PhD, NP-C

## 2022-04-12 NOTE — Progress Notes (Unsigned)
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 She states the hot flashes are severe, she will continue Dr. Marcelline Mates  CLL: seeing oncologist, [name] last visit was in October 2021 , she is on oral Revlimid, no longer on Imbruvica she has a new rash on legs that seems secondary to medication. She has thrombocytopenia on last CBC    Low back pain and radiculitis/neuropahty : she is on Lyrica, duloxetine, Tizanidine and Meloxicam  per pain management - Dr. Wynona Canes  and neurologist, from now on I will give her the muscle relaxer and nsaid's and requested by Dr. Wynona Canes . Pain is no longer constant, states pain is controlled at this time   She states tingling, numbness, pain comes and goes on both legs and arms, she also has cramping on hands and feet intermittent  Emphysema: she is still smoking. She has daily cough,  no wheezing but has intermittent sob -usually with activity- she thinks usually when she has hot flashes. She is on Advair but we will switch to anoro today    Atherosclerosis: she has been taking Atorvastatin and no side effects. Last LDL was up from 80 to 100. She is getting medication through [facility] and seems compliant , discussed change in diet    Carpal Tunnel: had NCS done by Dr. 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 06/05/2021   Lumbosacral radiculopathy/radiculitis at L5  (Bilateral) 06/05/2021   PAD (peripheral artery disease) ([place]) 02/14/2021   CIN II (cervical intraepithelial neoplasia II) 02/01/2021   S/P vaginal hysterectomy 01/22/2021   Acute right hip pain 06/05/2020   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/09/2020   Bilateral leg pain 11/03/2019   Iron deficiency anemia 10/15/2019   Bilateral arm pain 08/05/2019   Bilateral hand numbness 08/05/2019   Weakness of both hands 08/05/2019   Chronic musculoskeletal pain 07/19/2019   Chronic neuropathic pain 07/19/2019   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   Special screening for malignant neoplasms, colon    Polyp of sigmoid colon    Hemorrhoids    Trigeminal neuralgia 10/05/2018   Lumbar radiculitis (Right) 09/24/2018   Hypocalcemia 06/03/2018   Vitamin D insufficiency 06/03/2018   Abnormal MRI, lumbar spine (06/02/2020) 06/03/2018   Chronic hip pain (Right) 06/03/2018   Spondylosis without myelopathy or radiculopathy, lumbar region 06/03/2018   DDD (degenerative disc disease), lumbar 06/02/2018   Lumbar facet hypertrophy 06/02/2018   Lumbar facet arthropathy 06/02/2018   Lumbar facet syndrome (Bilateral) (R>L) 06/02/2018   Marijuana use 05/19/2018   Chronic lower extremity pain (1ry area of Pain) (Bilateral) (R>L) 05/13/2018   Chronic low back pain (2ry area of Pain) (Bilateral) (R>L) w/ sciatica (Bilateral) 05/13/2018   Chronic pain syndrome 05/13/2018   Disorder of skeletal system 05/13/2018   Pharmacologic therapy 05/13/2018   Problems influencing health  status 05/13/2018   Mastalgia 05/05/2018   Breast mass, right 01/07/2018   Face pain 12/11/2017   Tingling 12/11/2017   Thoracic aortic atherosclerosis ([place]) 08/20/2017   Emphysema of lung ([place]) 08/20/2017   Adductor tendinitis 04/23/2017   Trochanteric bursitis of right hip 04/23/2017   GERD without esophagitis 12/11/2016   CLL (chronic lymphocytic leukemia) ([place]) 11/24/2016   Stress incontinence 09/04/2016   B12 deficiency  07/10/2015   Insomnia 07/10/2015   Anorexia nervosa, restricting type 07/10/2015   Anxiety, generalized 07/10/2015   H/O suicide attempt 07/10/2015   Lymphocytosis 07/10/2015   Obsessive-compulsive disorder 07/10/2015   Tobacco use 07/10/2015   History of cervical dysplasia 07/18/2014    Past Surgical History:  Procedure Laterality Date   BREAST BIOPSY Right 01/05/2018   US guided biopsy of 2 areas and 1 lymph node, MIXED INFLAMMATION AND GIANT CELL REACTION   CERVICAL BIOPSY  W/ LOOP ELECTRODE EXCISION     COLONOSCOPY WITH PROPOFOL N/A 04/21/2019   Procedure: COLONOSCOPY WITH PROPOFOL;  Surgeon: Virgel Manifold, MD;  Location: [facility] ENDOSCOPY;  Service: Gastroenterology;  Laterality: N/A;   OTHER SURGICAL HISTORY     scar tissue removed from vocal cords   TUBAL LIGATION     VAGINAL HYSTERECTOMY N/A 01/22/2021   Procedure: HYSTERECTOMY VAGINAL; BILATERAL SALPINGECTOMY;  Surgeon: Rubie Maid, MD;  Location: [facility] ORS;  Service: Gynecology;  Laterality: N/A;   vocal cord surgery  2005    Family History  Problem Relation Age of Onset   Depression Mother    Cancer Mother        thyroid   Alcohol abuse Father    Alcohol abuse Brother    Depression Brother    Bipolar disorder Brother    Suicidality Brother    ADD / ADHD Son    Breast cancer Neg Hx     Social History   Tobacco Use   Smoking status: Every Day    Packs/day: 1.00    Years: 31.00    Total pack years: 31.00    Types: Cigarettes    Start date: 09/25/1986   Smokeless tobacco: Never  Substance Use Topics   Alcohol use: Not Currently    Alcohol/week: 0.0 standard drinks of alcohol    Comment: rarely     Current Outpatient Medications:    ADVAIR DISKUS 250-50 MCG/ACT AEPB, INHALE 1 PUFF BY MOUTH INTO LUNGS TWICE DAILY, Disp: 180 each, Rfl: 0   ARIPiprazole (ABILIFY) 5 MG tablet, Take 1 tablet (5 mg total) by mouth daily., Disp: 90 tablet, Rfl: 0   Ascorbic Acid (VITAMIN C) 1000 MG tablet, Take 1,000 mg by  mouth daily as needed (immune health)., Disp: , Rfl:    ASPIRIN 81 PO, Take 81 mg by mouth daily., Disp: , Rfl:    atorvastatin (LIPITOR) 10 MG tablet, Take 1 tablet (10 mg total) by mouth in the morning., Disp: 90 tablet, Rfl: 0   Crisaborole 2 % OINT, Apply topically., Disp: , Rfl:    DULoxetine (CYMBALTA) 60 MG capsule, Take 60 mg by mouth daily., Disp: , Rfl:    estrogens, conjugated, (PREMARIN) 0.625 MG tablet, Take 1 tablet (0.625 mg total) by mouth daily. Take daily for 21 days then do not take for 7 days., Disp: 90 tablet, Rfl: 3   hydrocortisone cream 1 %, Apply 1 Application topically 2 (two) times daily., Disp: 30 g, Rfl: 0   lenalidomide (REVLIMID) 10 MG capsule, Take 1 capsule (10 mg total) by mouth daily., Disp: 28  capsule, Rfl: 0   loratadine (CLARITIN) 10 MG tablet, TAKE ONE TABLET BY MOUTH EVERY MORNING, Disp: 90 tablet, Rfl: 0   Magnesium 500 MG CAPS, Take 500 mg by mouth in the morning., Disp: , Rfl:    meloxicam (MOBIC) 15 MG tablet, Take 1 tablet (15 mg total) by mouth daily., Disp: 90 tablet, Rfl: 1   mupirocin ointment (BACTROBAN) 2 %, Apply 1 Application topically 2 (two) times daily., Disp: 22 g, Rfl: 0   omeprazole (PRILOSEC) 20 MG capsule, Take 1 capsule (20 mg total) by mouth every morning., Disp: 90 capsule, Rfl: 3   ondansetron (ZOFRAN) 8 MG tablet, Take by mouth every 8 (eight) hours as needed., Disp: , Rfl:    Oxymetazoline HCl (RHOFADE) 1 % CREA, Qam to face, Disp: 30 g, Rfl: 11   pregabalin (LYRICA) 100 MG capsule, Take 100 mg by mouth 3 (three) times daily., Disp: , Rfl:    Ruxolitinib Phosphate 1.5 % CREA, Apply topically., Disp: , Rfl:    tiZANidine (ZANAFLEX) 2 MG tablet, TAKE ONE TABLET by MOUTH THREE times daily, Disp: 30 tablet, Rfl: 0   vitamin B-12 (CYANOCOBALAMIN) 500 MCG tablet, Take 250 mcg by mouth every morning., Disp: , Rfl:    Calcium Carbonate-Vit D-Min (GNP CALCIUM 1200) 1200-1000 MG-UNIT CHEW, Chew 1,200 mg by mouth daily with breakfast. Take in  combination with vitamin D and magnesium. (Patient taking differently: Chew 2 tablets by mouth in the morning, at noon, and at bedtime. Take in combination with vitamin D and magnesium.), Disp: 90 tablet, Rfl: 3  No Active Allergies  I personally reviewed active problem list, medication list, allergies, family history, social history, health maintenance with the patient/caregiver today.   ROS  Constitutional: Negative for fever or weight change.  Respiratory: Negative for cough and shortness of breath.   Cardiovascular: Negative for chest pain or palpitations.  Gastrointestinal: Negative for abdominal pain, no bowel changes.  Musculoskeletal: Negative for gait problem or joint swelling.  Skin: positive  for rash.  Neurological: Negative for dizziness or headache.  No other specific complaints in a complete review of systems (except as listed in HPI above).   Objective  Vitals:   04/15/22 0850  BP: 94/68  Pulse: 70  Resp: 16  Weight: 175 lb (79.4 kg)  Height: '5\' 7"'$  (1.702 m)    Body mass index is 27.41 kg/m.  Physical Exam  Constitutional: Patient appears well-developed and well-nourished. Overweight.  No distress.  HEENT: head atraumatic, normocephalic, pupils equal and reactive to light,, neck supple Cardiovascular: Normal rate, regular rhythm and normal heart sounds.  No murmur heard. No BLE edema. Pulmonary/Chest: Effort normal and breath sounds normal. No respiratory distress. Abdominal: Soft.  There is no tenderness. Skin: different types of rash, some ulcerations, some macules some papules Psychiatric: Patient has a normal mood and affect. behavior is normal. Judgment and thought content normal.   PHQ2/9:    04/15/2022    8:50 AM 03/08/2022   10:35 AM 01/01/2022   10:28 AM 12/06/2021    2:53 PM 10/29/2021    9:27 AM  Depression screen PHQ 2/9  Decreased Interest 0 0 0 0 0  Down, Depressed, Hopeless 0 0 0 0 0  PHQ - 2 Score 0 0 0 0 0  Altered sleeping 0   0 0   Tired, decreased energy 3   0 0  Change in appetite 0   0 0  Feeling bad or failure about yourself  0   0  0  Trouble concentrating 0   0 0  Moving slowly or fidgety/restless 0   0 0  Suicidal thoughts 0   0 0  PHQ-9 Score 3   0 0  Difficult doing work/chores    Not difficult at all     phq 9 is negative   Fall Risk:    04/15/2022    8:50 AM 03/08/2022   10:35 AM 01/01/2022   10:28 AM 12/06/2021    2:53 PM 10/29/2021    9:27 AM  Fall Risk   Falls in the past year? 1 0 1 1 0  Number falls in past yr: 0 0 1 1 0  Injury with Fall? 1 0 1 1 0  Risk for fall due to : No Fall Risks  History of fall(s) Impaired balance/gait No Fall Risks  Follow up Falls prevention discussed Falls evaluation completed Falls evaluation completed Education provided;Falls evaluation completed;Falls prevention discussed Falls prevention discussed      Functional Status Survey: Is the patient deaf or have difficulty hearing?: No Does the patient have difficulty seeing, even when wearing glasses/contacts?: No Does the patient have difficulty concentrating, remembering, or making decisions?: No Does the patient have difficulty walking or climbing stairs?: No Does the patient have difficulty dressing or bathing?: No Does the patient have difficulty doing errands alone such as visiting a doctor's office or shopping?: No    Assessment & Plan  1. Thoracic aortic atherosclerosis (HCC)  - atorvastatin (LIPITOR) 10 MG tablet; Take 1 tablet (10 mg total) by mouth in the morning.  Dispense: 90 tablet; Refill: 1  2. Mild episode of recurrent major depressive disorder (HCC)  - ARIPiprazole (ABILIFY) 5 MG tablet; Take 1 tablet (5 mg total) by mouth daily.  Dispense: 90 tablet; Refill: 1  3. Centrilobular emphysema ([place])  We will try switching from Advair to Anoro, reminded her to stop smoking   4. Pruritic dermatitis  - loratadine (CLARITIN) 10 MG tablet; Take 1 tablet (10 mg total) by mouth every morning.   Dispense: 90 tablet; Refill: 1  5. Thrombocytopenia (HCC)  Stable   6. Chronic neuropathic pain   7. 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  Dispense: 90 capsule; Refill: 1  8. Chronic musculoskeletal pain  - tiZANidine (ZANAFLEX) 2 MG tablet; Take 1 tablet (2 mg total) by mouth at bedtime.  Dispense: 90 tablet; Refill: 1 - meloxicam (MOBIC) 15 MG tablet; Take 1 tablet (15 mg total) by mouth daily.  Dispense: 90 tablet; Refill: 1  9. B12 deficiency   10. Chronic low back pain (Secondary Area of Pain) (Bilateral) (R>L) w/ sciatica (Bilateral)  - tiZANidine (ZANAFLEX) 2 MG tablet; Take 1 tablet (2 mg total) by mouth at bedtime.  Dispense: 90 tablet; Refill: 1 - meloxicam (MOBIC) 15 MG tablet; Take 1 tablet (15 mg total) by mouth daily.  Dispense: 90 tablet; Refill: 1  11. Vitamin D deficiency   12. Rash  She has an upcoming visit with dermatologist but advised to call them and see if she can be seen sooner

## 2022-04-13 NOTE — Progress Notes (Unsigned)
[place]  [phone] [phone]  ID: Monique [name] OB: 1971/05/13  MR#: [id number]  IFO#:[id number]  Patient Care Team: [name], MD as PCP - General (Family Medicine) Lloyd Huger, MD as Consulting Physician (Oncology) Lane Hacker, [facility] as Pharmacist (Pharmacist) Greg Cutter, LCSW as [place] Management (Licensed Clinical Social Worker) Gloris Ham, RN as Registered Nurse (Oncology) Borders, Kirt Boys, NP as Nurse Practitioner (Hospice and Palliative Medicine)  CHIEF COMPLAINT: CLL, iron deficiency.  INTERVAL HISTORY: Patient returns to clinic today for further evaluation and continuation of monthly Rituxan.  She continues to have a rash on her lower extremities, but otherwise feels well and is tolerating her treatments. She has no neurologic complaints.  She denies any fevers or night sweats.  She has a good appetite and denies weight loss.  She has no chest pain, shortness of breath, cough, or hemoptysis.  She denies any nausea, vomiting, constipation, or diarrhea.  She has no melena or hematochezia.  She has no urinary complaints.  Patient offers no further specific complaints today.  REVIEW OF SYSTEMS:   Review of Systems  Constitutional:  Negative for diaphoresis, fever, malaise/fatigue and weight loss.  HENT: Negative.  Negative for congestion.   Respiratory: Negative.  Negative for cough and shortness of breath.   Cardiovascular: Negative.  Negative for chest pain and leg swelling.  Gastrointestinal:  Negative for abdominal pain and constipation.  Genitourinary: Negative.  Negative for dysuria and frequency.  Musculoskeletal: Negative.  Negative for back pain, myalgias and neck pain.  Skin:  Positive for rash.  Neurological: Negative.  Negative for tingling, sensory change, focal weakness and weakness.  Psychiatric/Behavioral: Negative.  Negative for depression and suicidal ideas. The  patient is not nervous/anxious.     As per HPI. Otherwise, a complete review of systems is negative.  PAST MEDICAL HISTORY: Past Medical History:  Diagnosis Date   Anxiety    Bursitis    leg pain   Carpal tunnel syndrome    Cervical dysplasia    hx LEEP over 18 years ago.    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  (in lymph nodes)   COPD (chronic obstructive pulmonary disease) ([place])    COVID-19 2021   Depression    Eating disorder    GERD (gastroesophageal reflux disease)    History of self-harm    Insomnia    Obsession    Tobacco abuse    Vitamin B12 deficiency (non anemic)     PAST SURGICAL HISTORY: Past Surgical History:  Procedure Laterality Date   BREAST BIOPSY Right 01/05/2018   US guided biopsy of 2 areas and 1 lymph node, MIXED INFLAMMATION AND GIANT CELL REACTION   CERVICAL BIOPSY  W/ LOOP ELECTRODE EXCISION     COLONOSCOPY WITH PROPOFOL N/A 04/21/2019   Procedure: COLONOSCOPY WITH PROPOFOL;  Surgeon: Virgel Manifold, MD;  Location: [facility] END[facility]COPY;  Service: Gastroenterology;  Laterality: N/A;   OTHER SURGICAL HISTORY     scar tissue removed from vocal cords   TUBAL LIGATION     VAGINAL HYSTERECTOMY N/A 01/22/2021   Procedure: HYSTERECTOMY VAGINAL; BILATERAL SALPINGECTOMY;  Surgeon: Rubie Maid, MD;  Location: [facility] ORS;  Service: Gynecology;  Laterality: N/A;   vocal cord surgery  2005    FAMILY HISTORY: Family History  Problem Relation Age of Onset   Depression Mother    Cancer Mother        thyroid   Alcohol abuse  Father    Alcohol abuse Brother    Depression Brother    Bipolar disorder Brother    Suicidality Brother    ADD / ADHD Son    Breast cancer Neg Hx     ADVANCED DIRECTIVES (Y/N):  N  HEALTH MAINTENANCE: Social History   Tobacco Use   Smoking status: Every Day    Packs/day: 1.00    Years: 31.00    Total pack years: 31.00    Types: Cigarettes    Start date: 09/25/1986   Smokeless tobacco: Never  Vaping Use    Vaping Use: Former  Substance Use Topics   Alcohol use: Not Currently    Alcohol/week: 0.0 standard drinks of alcohol    Comment: rarely   Drug use: Yes    Frequency: 7.0 times per week    Types: Marijuana     Colonoscopy:  PAP:  Bone density:  Lipid panel:  No Active Allergies  Current Outpatient Medications  Medication Sig Dispense Refill   ARIPiprazole (ABILIFY) 5 MG tablet Take 1 tablet (5 mg total) by mouth daily. 90 tablet 1   Ascorbic Acid (VITAMIN C) 1000 MG tablet Take 1,000 mg by mouth daily as needed (immune health).     ASPIRIN 81 PO Take 81 mg by mouth daily.     atorvastatin (LIPITOR) 10 MG tablet Take 1 tablet (10 mg total) by mouth in the morning. 90 tablet 1   Crisaborole 2 % OINT Apply topically.     DULoxetine (CYMBALTA) 60 MG capsule Take 1 capsule (60 mg total) by mouth daily. 90 capsule 1   estrogens, conjugated, (PREMARIN) 0.625 MG tablet Take 1 tablet (0.625 mg total) by mouth daily. Take daily for 21 days then do not take for 7 days. 90 tablet 3   hydrocortisone cream 1 % Apply 1 Application topically 2 (two) times daily. 30 g 0   lenalidomide (REVLIMID) 10 MG capsule Take 1 capsule (10 mg total) by mouth daily. 28 capsule 0   loratadine (CLARITIN) 10 MG tablet Take 1 tablet (10 mg total) by mouth every morning. 90 tablet 1   Magnesium 500 MG CAPS Take 500 mg by mouth in the morning.     meloxicam (MOBIC) 15 MG tablet Take 1 tablet (15 mg total) by mouth daily. 90 tablet 1   mupirocin ointment (BACTROBAN) 2 % Apply 1 Application topically 2 (two) times daily. 22 g 0   omeprazole (PRILOSEC) 20 MG capsule Take 1 capsule (20 mg total) by mouth every morning. 90 capsule 3   ondansetron (ZOFRAN) 8 MG tablet Take by mouth every 8 (eight) hours as needed.     Oxymetazoline HCl (RHOFADE) 1 % CREA Qam to face 30 g 11   pregabalin (LYRICA) 100 MG capsule Take 100 mg by mouth 3 (three) times daily.     Ruxolitinib Phosphate 1.5 % CREA Apply topically.     tiZANidine  (ZANAFLEX) 2 MG tablet Take 1 tablet (2 mg total) by mouth at bedtime. 90 tablet 1   umeclidinium-vilanterol (ANORO ELLIPTA) 62.5-25 MCG/ACT AEPB Inhale 1 puff into the lungs daily. 60 each 5   vitamin B-12 (CYANOCOBALAMIN) 500 MCG tablet Take 250 mcg by mouth every morning.     Calcium Carbonate-Vit D-Min (GNP CALCIUM 1200) 1200-1000 MG-UNIT CHEW Chew 1,200 mg by mouth daily with breakfast. Take in combination with vitamin D and magnesium. (Patient taking differently: Chew 2 tablets by mouth in the morning, at noon, and at bedtime. Take in combination with vitamin  D and magnesium.) 90 tablet 3   No current facility-administered medications for this visit.    OBJECTIVE: Vitals:   04/18/22 0854  BP: (!) 102/50  Pulse: 80  Resp: 16  Temp: (!) 97.3 F (36.3 C)  SpO2: 100%      Body mass index is 27.38 kg/m.    ECOG FS:0 - Asymptomatic  General: Well-developed, well-nourished, no acute distress. Eyes: Pink conjunctiva, anicteric sclera. HEENT: Normocephalic, moist mucous membranes. Lungs: No audible wheezing or coughing. Heart: Regular rate and rhythm. Abdomen: Soft, nontender, no obvious distention. Musculoskeletal: No edema, cyanosis, or clubbing. Neuro: Alert, answering all questions appropriately. Cranial nerves grossly intact. Skin: Rash noted on bilateral lower extremity.   Psych: Normal affect. Lymphatics: No palpable lymphadenopathy.  LAB RESULTS:  Lab Results  Component Value Date   NA 137 04/18/2022   K 3.8 04/18/2022   CL 106 04/18/2022   CO2 24 04/18/2022   GLUCOSE 127 (H) 04/18/2022   BUN 20 04/18/2022   CREATININE 0.95 04/18/2022   CALCIUM 8.5 (L) 04/18/2022   PROT 6.3 (L) 04/18/2022   ALBUMIN 3.8 04/18/2022   AST 20 04/18/2022   ALT 25 04/18/2022   ALKPHOS 67 04/18/2022   BILITOT 0.5 04/18/2022   GFRNONAA >60 04/18/2022   GFRAA 79 12/06/2020    Lab Results  Component Value Date   WBC 13.3 (H) 04/18/2022   NEUTROABS PENDING 04/18/2022   HGB 14.5  04/18/2022   HCT 42.8 04/18/2022   MCV 98.2 04/18/2022   PLT 153 04/18/2022     STUDIES: No results found.  ONCOLOGY HISTORY:  Patient completed cycle 3 of Rituxan plus Treanda on March 26, 2018, but was then noted to have progressive disease.  She was then initiated on 480 mg Imbruvica in November 2019, but had progression of disease with increasing white blood cell count as well as CT scan on Jan 22, 2022 revealing increased lymphadenopathy consistent with progression of disease.  Attempted patient on venetoclax, but she could not tolerate it secondary to persistent diarrhea and transaminitis.   ASSESSMENT: CLL,  iron deficiency.  PLAN:    1. CLL: Confirmed by peripheral blood flow cytometry.  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  Patient recently completed 4 weekly cycles of Rituxan and now has transition to treatment every 4 weeks.  Despite rash, will also continue 10 mg Revlimid daily.  Proceed with treatment today.  Return to clinic in 4 weeks for further evaluation and consideration of cycle 6 of Rituxan.  Appreciate clinical pharmacy input. 2.  Leukocytosis: Mild.  Treatment as above.   3.  Trigeminal neuralgia: Patient does not complain of this today.  Appears to be unrelated to CLL or Imbruvica.  MRI of the brain on Jan 25, 2019 was unremarkable.   4.  Depression/anxiety: Chronic.  Continue follow-up and treatment per primary care. 5.  Neuropathic pain/peripheral neuropathy: Chronic and unchanged.  Patient reports she is now on disability.  Continue current follow-up and treatment as per neurology. 6.  Iron deficiency: Resolved.   7.  Rituxan reaction: Patient has been given additional premedications for preventative measures.  She did not have a reaction during cycle 2 or 3. 8.  Thrombocytopenia: Resolved. 9.  Rash: Continue loratadine and famotidine along with Revlimid.  Can consider low-dose  prednisone 2.5 mg daily if needed.   Patient expressed understanding and was in agreement with this plan. She also understands  that She can call clinic at any time with any questions, concerns, or complaints.    Lloyd Huger, MD   04/18/2022 9:44 AM

## 2022-04-15 ENCOUNTER — Encounter: Payer: Self-pay | Admitting: Family Medicine

## 2022-04-15 ENCOUNTER — Ambulatory Visit (INDEPENDENT_AMBULATORY_CARE_PROVIDER_SITE_OTHER): Payer: Medicare Other | Admitting: Family Medicine

## 2022-04-15 VITALS — BP 94/68 | HR 70 | Resp 16 | Ht 67.0 in | Wt 175.0 lb

## 2022-04-15 DIAGNOSIS — F330 Major depressive disorder, recurrent, mild: Secondary | ICD-10-CM

## 2022-04-15 DIAGNOSIS — E559 Vitamin D deficiency, unspecified: Secondary | ICD-10-CM

## 2022-04-15 DIAGNOSIS — M5441 Lumbago with sciatica, right side: Secondary | ICD-10-CM

## 2022-04-15 DIAGNOSIS — J432 Centrilobular emphysema: Secondary | ICD-10-CM | POA: Diagnosis not present

## 2022-04-15 DIAGNOSIS — I700 Atherosclerosis of aorta: Secondary | ICD-10-CM | POA: Diagnosis not present

## 2022-04-15 DIAGNOSIS — M5442 Lumbago with sciatica, left side: Secondary | ICD-10-CM

## 2022-04-15 DIAGNOSIS — G608 Other hereditary and idiopathic neuropathies: Secondary | ICD-10-CM

## 2022-04-15 DIAGNOSIS — G8929 Other chronic pain: Secondary | ICD-10-CM

## 2022-04-15 DIAGNOSIS — M792 Neuralgia and neuritis, unspecified: Secondary | ICD-10-CM

## 2022-04-15 DIAGNOSIS — R21 Rash and other nonspecific skin eruption: Secondary | ICD-10-CM

## 2022-04-15 DIAGNOSIS — E538 Deficiency of other specified B group vitamins: Secondary | ICD-10-CM

## 2022-04-15 DIAGNOSIS — L299 Pruritus, unspecified: Secondary | ICD-10-CM

## 2022-04-15 DIAGNOSIS — M7918 Myalgia, other site: Secondary | ICD-10-CM

## 2022-04-15 DIAGNOSIS — D696 Thrombocytopenia, unspecified: Secondary | ICD-10-CM

## 2022-04-15 MED ORDER — TIZANIDINE HCL 2 MG PO TABS: 2.0000 mg | ORAL_TABLET | Freq: Every evening | ORAL | 1 refills | Status: DC

## 2022-04-15 MED ORDER — MELOXICAM 15 MG PO TABS: 15.0000 mg | ORAL_TABLET | Freq: Every day | ORAL | 1 refills | Status: DC

## 2022-04-15 MED ORDER — DULOXETINE HCL 60 MG PO CPEP: 60.0000 mg | ORAL_CAPSULE | Freq: Every day | ORAL | 1 refills | Status: DC

## 2022-04-15 MED ORDER — ARIPIPRAZOLE 5 MG PO TABS: 5.0000 mg | ORAL_TABLET | Freq: Every day | ORAL | 1 refills | Status: DC

## 2022-04-15 MED ORDER — ANORO ELLIPTA 62.5-25 MCG/ACT IN AEPB
1.0000 | INHALATION_SPRAY | Freq: Every day | RESPIRATORY_TRACT | 5 refills | Status: DC
Start: 2022-04-15 — End: 2022-10-15

## 2022-04-15 MED ORDER — ATORVASTATIN CALCIUM 10 MG PO TABS: 10.0000 mg | ORAL_TABLET | Freq: Every morning | ORAL | 1 refills | Status: DC

## 2022-04-15 MED ORDER — LORATADINE 10 MG PO TABS: 10.0000 mg | ORAL_TABLET | Freq: Every morning | ORAL | 1 refills | Status: DC

## 2022-04-16 ENCOUNTER — Other Ambulatory Visit: Payer: Self-pay

## 2022-04-17 MED FILL — Dexamethasone Sodium Phosphate Inj 100 MG/10ML: INTRAMUSCULAR | Qty: 2 | Status: AC

## 2022-04-18 ENCOUNTER — Inpatient Hospital Stay: Payer: Medicare Other | Attending: Oncology

## 2022-04-18 ENCOUNTER — Encounter: Payer: Self-pay | Admitting: Oncology

## 2022-04-18 ENCOUNTER — Inpatient Hospital Stay (HOSPITAL_BASED_OUTPATIENT_CLINIC_OR_DEPARTMENT_OTHER): Payer: Medicare Other | Admitting: Oncology

## 2022-04-18 ENCOUNTER — Inpatient Hospital Stay: Payer: Medicare Other

## 2022-04-18 ENCOUNTER — Inpatient Hospital Stay: Payer: Medicare Other | Admitting: Pharmacist

## 2022-04-18 ENCOUNTER — Other Ambulatory Visit: Payer: Self-pay | Admitting: *Deleted

## 2022-04-18 VITALS — BP 113/81 | HR 74 | Temp 97.0°F | Resp 17

## 2022-04-18 VITALS — BP 102/50 | HR 80 | Temp 97.3°F | Resp 16 | Ht 67.0 in | Wt 174.8 lb

## 2022-04-18 DIAGNOSIS — C9110 Chronic lymphocytic leukemia of B-cell type not having achieved remission: Secondary | ICD-10-CM

## 2022-04-18 DIAGNOSIS — Z5112 Encounter for antineoplastic immunotherapy: Secondary | ICD-10-CM | POA: Insufficient documentation

## 2022-04-18 DIAGNOSIS — Z79899 Other long term (current) drug therapy: Secondary | ICD-10-CM | POA: Diagnosis not present

## 2022-04-18 LAB — CBC WITH DIFFERENTIAL/PLATELET
Abs Immature Granulocytes: 0 10*3/uL (ref 0.00–0.07)
Basophils Absolute: 0.1 10*3/uL (ref 0.0–0.1)
Basophils Relative: 1 %
Eosinophils Absolute: 0.8 10*3/uL — ABNORMAL HIGH (ref 0.0–0.5)
Eosinophils Relative: 6 %
HCT: 42.8 % (ref 36.0–46.0)
Hemoglobin: 14.5 g/dL (ref 12.0–15.0)
Lymphocytes Relative: 51 %
Lymphs Abs: 6.8 10*3/uL — ABNORMAL HIGH (ref 0.7–4.0)
MCH: 33.3 pg (ref 26.0–34.0)
MCHC: 33.9 g/dL (ref 30.0–36.0)
MCV: 98.2 fL (ref 80.0–100.0)
Monocytes Absolute: 0.4 10*3/uL (ref 0.1–1.0)
Monocytes Relative: 3 %
Neutro Abs: 5.2 10*3/uL (ref 1.7–7.7)
Neutrophils Relative %: 39 %
Platelets: 153 10*3/uL (ref 150–400)
RBC: 4.36 MIL/uL (ref 3.87–5.11)
RDW: 15.1 % (ref 11.5–15.5)
Smear Review: NORMAL
WBC: 13.3 10*3/uL — ABNORMAL HIGH (ref 4.0–10.5)
nRBC: 0 % (ref 0.0–0.2)

## 2022-04-18 LAB — COMPREHENSIVE METABOLIC PANEL
ALT: 25 U/L (ref 0–44)
AST: 20 U/L (ref 15–41)
Albumin: 3.8 g/dL (ref 3.5–5.0)
Alkaline Phosphatase: 67 U/L (ref 38–126)
Anion gap: 7 (ref 5–15)
BUN: 20 mg/dL (ref 6–20)
CO2: 24 mmol/L (ref 22–32)
Calcium: 8.5 mg/dL — ABNORMAL LOW (ref 8.9–10.3)
Chloride: 106 mmol/L (ref 98–111)
Creatinine, Ser: 0.95 mg/dL (ref 0.44–1.00)
GFR, Estimated: >60 mL/min (ref >60)
Glucose, Bld: 127 mg/dL — ABNORMAL HIGH (ref 70–99)
Potassium: 3.8 mmol/L (ref 3.5–5.1)
Sodium: 137 mmol/L (ref 135–145)
Total Bilirubin: 0.5 mg/dL (ref 0.3–1.2)
Total Protein: 6.3 g/dL — ABNORMAL LOW (ref 6.5–8.1)

## 2022-04-18 MED ORDER — ACETAMINOPHEN 325 MG PO TABS
650.0000 mg | ORAL_TABLET | Freq: Once | ORAL | Status: AC
  Administered 2022-04-18: 650 mg via ORAL
  Filled 2022-04-18: qty 2

## 2022-04-18 MED ORDER — LENALIDOMIDE 10 MG PO CAPS: 10.0000 mg | ORAL_CAPSULE | Freq: Every day | ORAL | 0 refills | Status: DC

## 2022-04-18 MED ORDER — DIPHENHYDRAMINE HCL 50 MG/ML IJ SOLN
50.0000 mg | Freq: Once | INTRAMUSCULAR | Status: AC
  Administered 2022-04-18: 50 mg via INTRAVENOUS
  Filled 2022-04-18: qty 1

## 2022-04-18 MED ORDER — SODIUM CHLORIDE 0.9 % IV SOLN
20.0000 mg | Freq: Once | INTRAVENOUS | Status: AC
  Administered 2022-04-18: 20 mg via INTRAVENOUS
  Filled 2022-04-18: qty 20

## 2022-04-18 MED ORDER — SODIUM CHLORIDE 0.9 % IV SOLN
Freq: Once | INTRAVENOUS | Status: AC
  Filled 2022-04-18: qty 250

## 2022-04-18 MED ORDER — FAMOTIDINE IN NACL 20-0.9 MG/50ML-% IV SOLN
20.0000 mg | Freq: Once | INTRAVENOUS | Status: AC
  Administered 2022-04-18: 20 mg via INTRAVENOUS
  Filled 2022-04-18: qty 50

## 2022-04-18 MED ORDER — SODIUM CHLORIDE 0.9 % IV SOLN
375.0000 mg/m2 | Freq: Once | INTRAVENOUS | Status: AC
  Administered 2022-04-18: 700 mg via INTRAVENOUS
  Filled 2022-04-18: qty 20

## 2022-04-18 NOTE — Progress Notes (Signed)
[place]  [phone] [phone]  Patient Care Team: [name], MD as PCP - General (Family Medicine) Lloyd Huger, MD as Consulting Physician (Oncology) Lane Hacker, [facility] as Pharmacist (Pharmacist) Greg Cutter, LCSW as [place] Management (Licensed Clinical Social Worker) Gloris Ham, RN as Registered Nurse (Oncology) Borders, Kirt Boys, NP as Nurse Practitioner [facility] and Palliative Medicine)   Name of the patient: Monique [name]  [id number]  06-10-1971   Date of visit: 04/18/22  HPI: Patient is a 51 y.o. female with  progressive CLL, previously treated with ibrutinib. Patient was intolerant to venetoclax treatment initiation and did not want to retry initiating venetoclax. The plan is to now treat her with rituximab and lenalidomide. Patient started rituximab on 02/28/22 and lenalidomide 03/01/22.  On 04/03/22, patient presented to clinic with a rash. Her lenalidomide was held and she was treated with prednisone (1 week) and loratadine continuosly. Rash improved by 04/10/22 f/u appt and she was restarted on lenalidomide.  Reason for Consult: Oral chemotherapy follow-up for lenalidomide therapy.   PAST MEDICAL HISTORY: Past Medical History:  Diagnosis Date   Anxiety    Bursitis    leg pain   Carpal tunnel syndrome    Cervical dysplasia    hx LEEP over 18 years ago.    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  (in lymph nodes)   COPD (chronic obstructive pulmonary disease) ([place])    COVID-19 2021   Depression    Eating disorder    GERD (gastroesophageal reflux disease)    History of self-harm    Insomnia    Obsession    Tobacco abuse    Vitamin B12 deficiency (non anemic)     HEMATOLOGY/ONCOLOGY HISTORY:  Oncology History Overview Note  Patient with recent CLL diagnosis by flow cytometry.  Previously not being treated in observation.   S/p 4  cycles of weekly Rituxan completing on 02/20/2017 scans from January 2018 revealed minimal progression.   Developed right breast mass in March 2019.  Had diagnostic mammogram performed on 12/26/2017 showed suspicious right breast mass.  Biopsy revealed granulomatous mastitis.  She was treated with doxycycline 100 mg twice daily for 7 days.   Developed worsening adenopathy.  CT chest/abdomen/pelvis and soft tissue neck from May 2019 revealed progressive bulky adenopathy in the chest, abdomen and pelvis compatible with worsening disease.   [name] recommended beginning treatment with Rituxan and Treanda.  Received first cycle last week.  S/p 6 cycles of Rituxan and Treanda.  Tolerating well.  Last dose given 03/25/18.   CLL (chronic lymphocytic leukemia) ([place])  11/24/2016 Initial Diagnosis   CLL (chronic lymphocytic leukemia) ([place])   01/21/2018 - 03/26/2018 Chemotherapy   The patient had palonosetron (ALOXI) injection 0.25 mg, 0.25 mg, Intravenous,  Once, 4 of 5 cycles Administration: 0.25 mg (01/28/2018), 0.25 mg (02/25/2018), 0.25 mg (03/25/2018) riTUXimab (RITUXAN) 700 mg in sodium chloride 0.9 % 250 mL (2.1875 mg/mL) infusion, 375 mg/m2 = 700 mg, Intravenous,  Once, 4 of 5 cycles Administration: 700 mg (01/28/2018) bendamustine (BENDEKA) 150 mg in sodium chloride 0.9 % 50 mL (2.6786 mg/mL) chemo infusion, 90 mg/m2 = 150 mg, Intravenous,  Once, 4 of 5 cycles Administration: 150 mg (01/28/2018), 150 mg (01/29/2018), 150 mg (02/25/2018), 150 mg (02/26/2018), 150 mg (03/25/2018), 150 mg (03/26/2018)  for chemotherapy treatment.    02/28/2022 -  Chemotherapy   Patient is on Treatment Plan : NON-HODGKINS LYMPHOMA Rituximab q7d  03/12/2022 - 03/12/2022 Chemotherapy   Patient is on Treatment Plan : LYMPHOMA CLL/SLL Venetoclax ramp up / Venetoclax + Rituximab (375/500) q28d x 6 cycles / Venetoclax q28d x 18 cycles       ALLERGIES:  has no active allergies.  MEDICATIONS:  Current Outpatient Medications   Medication Sig Dispense Refill   ARIPiprazole (ABILIFY) 5 MG tablet Take 1 tablet (5 mg total) by mouth daily. 90 tablet 1   Ascorbic Acid (VITAMIN C) 1000 MG tablet Take 1,000 mg by mouth daily as needed (immune health).     ASPIRIN 81 PO Take 81 mg by mouth daily.     atorvastatin (LIPITOR) 10 MG tablet Take 1 tablet (10 mg total) by mouth in the morning. 90 tablet 1   Calcium Carbonate-Vit D-Min (GNP CALCIUM 1200) 1200-1000 MG-UNIT CHEW Chew 1,200 mg by mouth daily with breakfast. Take in combination with vitamin D and magnesium. (Patient taking differently: Chew 2 tablets by mouth in the morning, at noon, and at bedtime. Take in combination with vitamin D and magnesium.) 90 tablet 3   Crisaborole 2 % OINT Apply topically.     DULoxetine (CYMBALTA) 60 MG capsule Take 1 capsule (60 mg total) by mouth daily. 90 capsule 1   estrogens, conjugated, (PREMARIN) 0.625 MG tablet Take 1 tablet (0.625 mg total) by mouth daily. Take daily for 21 days then do not take for 7 days. 90 tablet 3   hydrocortisone cream 1 % Apply 1 Application topically 2 (two) times daily. 30 g 0   lenalidomide (REVLIMID) 10 MG capsule Take 1 capsule (10 mg total) by mouth daily. 28 capsule 0   loratadine (CLARITIN) 10 MG tablet Take 1 tablet (10 mg total) by mouth every morning. 90 tablet 1   Magnesium 500 MG CAPS Take 500 mg by mouth in the morning.     meloxicam (MOBIC) 15 MG tablet Take 1 tablet (15 mg total) by mouth daily. 90 tablet 1   mupirocin ointment (BACTROBAN) 2 % Apply 1 Application topically 2 (two) times daily. 22 g 0   omeprazole (PRILOSEC) 20 MG capsule Take 1 capsule (20 mg total) by mouth every morning. 90 capsule 3   ondansetron (ZOFRAN) 8 MG tablet Take by mouth every 8 (eight) hours as needed.     Oxymetazoline HCl (RHOFADE) 1 % CREA Qam to face 30 g 11   pregabalin (LYRICA) 100 MG capsule Take 100 mg by mouth 3 (three) times daily.     Ruxolitinib Phosphate 1.5 % CREA Apply topically.     tiZANidine  (ZANAFLEX) 2 MG tablet Take 1 tablet (2 mg total) by mouth at bedtime. 90 tablet 1   umeclidinium-vilanterol (ANORO ELLIPTA) 62.5-25 MCG/ACT AEPB Inhale 1 puff into the lungs daily. 60 each 5   vitamin B-12 (CYANOCOBALAMIN) 500 MCG tablet Take 250 mcg by mouth every morning.     No current facility-administered medications for this visit.    VITAL SIGNS: LMP 08/17/2016  There were no vitals filed for this visit.  Estimated body mass index is 27.38 kg/m as calculated from the following:   Height as of an earlier encounter on 04/18/22: '5\' 7"'$  (1.702 m).   Weight as of an earlier encounter on 04/18/22: 79.3 kg (174 lb 12.8 oz).  LABS: CBC:    Component Value Date/Time   WBC 13.3 (H) 04/18/2022 0832   HGB 14.5 04/18/2022 0832   HCT 42.8 04/18/2022 0832   PLT 153 04/18/2022 0832   MCV 98.2 04/18/2022 0832  NEUTROABS PENDING 04/18/2022 0832   LYMPHSABS PENDING 04/18/2022 0832   MONOABS PENDING 04/18/2022 0832   EOSABS PENDING 04/18/2022 0832   BASOSABS PENDING 04/18/2022 0832   Comprehensive Metabolic Panel:    Component Value Date/Time   NA 137 04/18/2022 0832   NA 138 05/13/2018 1413   K 3.8 04/18/2022 0832   CL 106 04/18/2022 0832   CO2 24 04/18/2022 0832   BUN 20 04/18/2022 0832   BUN 15 05/13/2018 1413   CREATININE 0.95 04/18/2022 0832   CREATININE 0.96 12/06/2020 1646   GLUCOSE 127 (H) 04/18/2022 0832   CALCIUM 8.5 (L) 04/18/2022 0832   AST 20 04/18/2022 0832   ALT 25 04/18/2022 0832   ALKPHOS 67 04/18/2022 0832   BILITOT 0.5 04/18/2022 0832   BILITOT 0.4 05/13/2018 1413   PROT 6.3 (L) 04/18/2022 0832   PROT 6.3 05/13/2018 1413   ALBUMIN 3.8 04/18/2022 0832   ALBUMIN 4.1 05/13/2018 1413     Present during today's visit: patient only  Assessment and Plan: Patient present with recurrent rash on her right and left. She is still taking her loratadine daily.  Patient to begin taking famotidine daily as a premedication CMP/CBC reviewed, plan to continue lenalidomide  at 10 mg daily. The plan is to add on low dose prednisone 2.'5mg'$  daily if rash does not improve.  Other: Patient is going to [place] tomorrow she knows to stop her lenalidomide if the rash worsens well she is away.    Oral Chemotherapy Side Effect/Intolerance:  Diarrhea: patient reports one incidence of diarrhea since her last visit. No loperamide needed. No reported edema, nausea, or muscle pain   Oral Chemotherapy Adherence: No missed doses reported. No patient barriers to medication adherence identified.   New medications: None reported.  Medication Access Issues: None reported.  Patient expressed understanding and was in agreement with this plan. She also understands that She can call clinic at any time with any questions, concerns, or complaints.   Follow-up plan: RTC 4 weeks  Thank you for allowing me to participate in the care of this very pleasant patient.   Time Total: 15 minutes  Visit consisted of counseling and education on dealing with issues of symptom management in the setting of serious and potentially life-threatening illness.Greater than 50%  of this time was spent counseling and coordinating care related to the above assessment and plan.  Signed by: [name], PharmD, BCPS, Salley Slaughter, CPP Hematology/Oncology Clinical Pharmacist Practitioner [place]/DB/AP [facility] [phone]  04/18/2022 9:38 AM

## 2022-04-18 NOTE — Addendum Note (Signed)
Addended by: [name] on: 04/18/2022 04:31 PM   Modules accepted: Orders

## 2022-04-18 NOTE — Patient Instructions (Addendum)
[facility]  Discharge Instructions: Thank you for choosing [facility] to provide your oncology and hematology care.  If you have a lab appointment with the Cancer Center, please go directly to the Cancer Center and check in at the registration area.  Wear comfortable clothing and clothing appropriate for easy access to any Portacath or PICC line.   We strive to give you quality time with your provider. You may need to reschedule your appointment if you arrive late (15 or more minutes).  Arriving late affects you and other patients whose appointments are after yours.  Also, if you miss three or more appointments without notifying the office, you may be dismissed from the clinic at the provider's discretion.      For prescription refill requests, have your pharmacy contact our office and allow 72 hours for refills to be completed.    Today you received the following chemotherapy and/or immunotherapy agents: Ruxience      To help prevent nausea and vomiting after your treatment, we encourage you to take your nausea medication as directed.  BELOW ARE SYMPTOMS THAT SHOULD BE REPORTED IMMEDIATELY: *FEVER GREATER THAN 100.4 F (38 C) OR HIGHER *CHILLS OR SWEATING *NAUSEA AND VOMITING THAT IS NOT CONTROLLED WITH YOUR NAUSEA MEDICATION *UNUSUAL SHORTNESS OF BREATH *UNUSUAL BRUISING OR BLEEDING *URINARY PROBLEMS (pain or burning when urinating, or frequent urination) *BOWEL PROBLEMS (unusual diarrhea, constipation, pain near the anus) TENDERNESS IN MOUTH AND THROAT WITH OR WITHOUT PRESENCE OF ULCERS (sore throat, sores in mouth, or a toothache) UNUSUAL RASH, SWELLING OR PAIN  UNUSUAL VAGINAL DISCHARGE OR ITCHING   Items with * indicate a potential emergency and should be followed up as soon as possible or go to the Emergency Department if any problems should occur.  Please show the CHEMOTHERAPY ALERT CARD or IMMUNOTHERAPY ALERT CARD at check-in to  the Emergency Department and triage nurse.  Should you have questions after your visit or need to cancel or reschedule your appointment, please contact [facility]  [phone] and follow the prompts.  Office hours are 8:00 a.m. to 4:30 p.m. Monday - Friday. Please note that voicemails left after 4:00 p.m. may not be returned until the following business day.  We are closed weekends and major holidays. You have access to a nurse at all times for urgent questions. Please call the main number to the clinic [phone] and follow the prompts.  For any non-urgent questions, you may also contact your provider using MyChart. We now offer e-Visits for anyone 18 and older to request care online for non-urgent symptoms. For details visit [email or web address].   Also download the MyChart app! Go to the app store, search "MyChart", open the app, select [facility], and log in with your MyChart username and password.  Masks are optional in the cancer centers. If you would like for your care team to wear a mask while they are taking care of you, please let them know. For doctor visits, patients may have with them one support person who is at least 53 years old. At this time, visitors are not allowed in the infusion area.   

## 2022-04-25 ENCOUNTER — Other Ambulatory Visit: Payer: Self-pay | Admitting: Nurse Practitioner

## 2022-04-25 DIAGNOSIS — W57XXXA Bitten or stung by nonvenomous insect and other nonvenomous arthropods, initial encounter: Secondary | ICD-10-CM

## 2022-04-25 NOTE — Telephone Encounter (Signed)
Requested medication (s) are due for refill today - provider review  Requested medication (s) are on the active medication list -yes  Future visit scheduled -yes  Last refill: 03/08/22 30g  Notes to clinic: non delegated Rx, acute Rx  Requested Prescriptions  Pending Prescriptions Disp Refills   hydrocortisone cream 1 % [Pharmacy Med Name: hydrocortisone-aloe vera 1 % topical cream] 30 g 0    Sig: APPLY TOPICALLY TWICE DAILY     Off-Protocol Failed - 04/25/2022  1:15 PM      Failed - Medication not assigned to a protocol, review manually.      Passed - Valid encounter within last 12 months    Recent Outpatient Visits           1 week ago Thoracic aortic atherosclerosis [facility])   [facility] [name], MD   1 month ago Skin infection   [facility] Bo Merino, FNP   3 months ago Anxiety, generalized   [facility] Rory Percy M, DO   4 months ago Closed fracture of coccyx with routine healing, subsequent encounter   [facility] Bo Merino, FNP   5 months ago Well adult exam   [facility] [name], MD       Future Appointments             In 2 weeks [name], MD [place]   In 2 months [name], MD [place]   In 5 months [name], MD [facility] [place], [facility]           Not Delegated - Over the Counter: OTC 2 Failed - 04/25/2022  1:15 PM      Failed - This refill cannot be delegated      Passed - Valid encounter within last 12 months    Recent Outpatient Visits           1 week ago Thoracic aortic atherosclerosis [facility])   [facility] [name], MD   1 month ago Skin infection   [facility] Bo Merino, FNP   3 months ago Anxiety, generalized   [facility] Myles Gip, DO   4 months ago Closed fracture of  coccyx with routine healing, subsequent encounter   [facility] Bo Merino, FNP   5 months ago Well adult exam   [facility] [name], MD       Future Appointments             In 2 weeks [name], MD [place]   In 2 months [name], MD [place]   In 5 months [name], MD [facility], [facility]               Requested Prescriptions  Pending Prescriptions Disp Refills   hydrocortisone cream 1 % [Pharmacy Med Name: hydrocortisone-aloe vera 1 % topical cream] 30 g 0    Sig: APPLY TOPICALLY TWICE DAILY     Off-Protocol Failed - 04/25/2022  1:15 PM      Failed - Medication not assigned to a protocol, review manually.      Passed - Valid encounter within last 12 months    Recent Outpatient Visits           1 week ago Thoracic aortic atherosclerosis ([place])   [facility] [name], MD   1 month ago Skin infection   [facility] Bo Merino, FNP   3 months ago Anxiety, generalized   [facility] Myles Gip, DO   4 months ago Closed fracture of coccyx with routine healing, subsequent encounter   [facility] Bo Merino, FNP   5 months ago Well adult exam   [facility] [name], MD       Future Appointments             In 2 weeks [name], MD [place]   In 2 months [name], MD [place]   In 5 months [name], MD [facility], [facility]           Not Delegated - Over the Counter: OTC 2 Failed - 04/25/2022  1:15 PM      Failed - This refill cannot be delegated      Passed - Valid encounter within last 12 months    Recent Outpatient Visits           1 week ago Thoracic aortic atherosclerosis [facility])   [facility] [name], MD   1 month ago  Skin infection   [place], FNP   3 months ago Anxiety, generalized   [facility] Myles Gip, DO   4 months ago Closed fracture of coccyx with routine healing, subsequent encounter   [facility] Bo Merino, FNP   5 months ago Well adult exam   [facility] [name], MD       Future Appointments             In 2 weeks [name], MD [place]   In 2 months [name], MD [place]   In 5 months [name], MD [facility], [facility]

## 2022-04-29 ENCOUNTER — Telehealth: Payer: Self-pay | Admitting: Pharmacist

## 2022-04-29 DIAGNOSIS — C9110 Chronic lymphocytic leukemia of B-cell type not having achieved remission: Secondary | ICD-10-CM

## 2022-04-29 NOTE — Telephone Encounter (Signed)
Oral Chemotherapy Pharmacist Encounter   Patient called on 04/28/22 and LVM stating that her rash worsened to she held her lenalidomide.  Called patient for additional detail and she stated that she noticed that the rash was spreading on her left leg, so she stopped the lenalidomide. She took her last dose on 04/23/22. She has not noticed any worsen or improvement since stopping the lenalidomide. Rash is not present anywhere outside of her legs.   No itching reported, but she did reported some swelling in her ankles/lower legs. When asked to push down on on the area of swelling during telephone call, she did not see any pitting.   She also mentioned that in Sunday 04/21/22 she had cramping in the legs, sometimes severe enough that she thought she might fall.   Patient is out of town until Thursday 05/02/22   [name], PharmD, BCPS, BCOP, CPP Hematology/Oncology Clinical Pharmacist  AFB/DB/AP Oral [facility] [phone]  04/29/2022 3:43 PM

## 2022-04-30 MED ORDER — PREDNISONE 10 MG PO TABS: ORAL_TABLET | ORAL | 0 refills | Status: DC

## 2022-04-30 MED ORDER — PREDNISONE 5 MG PO TABS: 5.0000 mg | ORAL_TABLET | Freq: Every day | ORAL | 2 refills | Status: DC

## 2022-04-30 NOTE — Telephone Encounter (Signed)
Oral Chemotherapy Pharmacist Encounter   Spoke with [name], the plan for Ms. Cater is to continue have her hold the lenalidomide, begin a prednisone taper, and taper down to prednisone '5mg'$  daily. Once on prednisone 5 mg, she will continue that daily and also at that time resume her lenalidomide.   Called patient and reviewed the above plan with her. She stated her understanding. I also asked her to take a picture of her rash currently so that is can be used for comparison at her next office follow-up visit.    [name], PharmD, BCPS, BCOP, CPP Hematology/Oncology Clinical Pharmacist [place]/DB/AP Oral [facility] [phone]  04/30/2022 2:16 PM

## 2022-05-01 ENCOUNTER — Telehealth: Payer: Self-pay | Admitting: Licensed Clinical Social Worker

## 2022-05-01 NOTE — Patient Outreach (Signed)
Medicaid Managed Care Social Work Note  05/01/2022 Name:  Monique [name] MRN:  [id number] DOB:  Feb 01, 1971  Monique [name] is an 51 y.o. year old female who is a primary patient of Monique Sizer, [name].  The [facility] Managed Care Coordination team was consulted for assistance with:  Disease Management and care coordination needs  Monique [name] was given information about Medicaid Managed Care Coordination team services today. Monique [name] Patient did not agree to services.  Patient provided Medicaid Managed Care team contact information and advised to reach out if in need of care coordination or care management services.  Primary care provider advised of patient declination of services.  Engaged with patient  for by telephone in response to referral for case management and/or care coordination services.   Assessments/Interventions:  Review of past medical history, allergies, medications, health status, including review of consultants reports, laboratory and other test data, was performed as part of comprehensive evaluation and provision of chronic care management services.  Care Plan                 No Active Allergies  Medications Reviewed Today     Reviewed by Monique [name] (Certified Medical Assistant) on 04/18/22 at [phone]  Med List Status: <None>   Medication Order Taking? Sig Documenting Provider Last Dose Status Informant  ARIPiprazole (ABILIFY) 5 MG tablet [id number] Yes Take 1 tablet (5 mg total) by mouth daily. Monique Sizer, [name] Taking Active   Ascorbic Acid (VITAMIN C) 1000 MG tablet [id number] Yes Take 1,000 mg by mouth daily as needed (immune health). Provider, Historical, [name] Taking Active Self  ASPIRIN 81 PO [id number] Yes Take 81 mg by mouth daily. Provider, Historical, [name] Taking Active   atorvastatin (LIPITOR) 10 MG tablet [id number] Yes Take 1 tablet (10 mg total) by mouth in the morning. Monique Sizer, [name] Taking Active   Calcium Carbonate-Vit D-Min Minor And Monique [name] CALCIUM  1200) 1200-1000 MG-UNIT CHEW [id number]  Chew 1,200 mg by mouth daily with breakfast. Take in combination with vitamin D and magnesium.  Patient taking differently: Chew 2 tablets by mouth in the morning, at noon, and at bedtime. Take in combination with vitamin D and magnesium.   Monique [name]  Expired 04/10/22 2359            Med Note Annett Fabian, KATHERINE   Thu Mar 14, 2022  9:17 AM) Pt states 2 tablets daily.   Monique [name] 2 % OINT [id number] Yes Apply topically. Provider, Historical, [name] Taking Active   DULoxetine (CYMBALTA) 60 MG capsule [id number] Yes Take 1 capsule (60 mg total) by mouth daily. Monique Sizer, [name] Taking Active   estrogens, conjugated, (PREMARIN) 0.625 MG tablet [id number] Yes Take 1 tablet (0.625 mg total) by mouth daily. Take daily for 21 days then do not take for 7 days. Monique [name] Taking Active   hydrocortisone cream 1 % [id number] Yes Apply 1 Application topically 2 (two) times daily. Monique [name] Taking Active   lenalidomide (REVLIMID) 10 MG capsule [id number] Yes Take 1 capsule (10 mg total) by mouth daily. Monique [name] Taking Active   loratadine (CLARITIN) 10 MG tablet [id number] Yes Take 1 tablet (10 mg total) by mouth every morning. Monique Sizer, [name] Taking Active   Magnesium 500 MG CAPS [id number] Yes Take 500 mg by mouth in the morning. Provider, Historical, [name] Taking Active Self  meloxicam (MOBIC) 15 MG tablet [id number] Yes Take 1 tablet (15 mg total) by mouth daily. [place], [place],  [name] Taking Active   mupirocin ointment (BACTROBAN) 2 % [id number] Yes Apply 1 Application topically 2 (two) times daily. Monique [name] Taking Active   omeprazole (PRILOSEC) 20 MG capsule [id number] Yes Take 1 capsule (20 mg total) by mouth every morning. Monique Sizer, [name] Taking Active   ondansetron [facility]) 8 MG tablet [id number] Yes Take by mouth every 8 (eight) hours as needed. Provider, Historical, [name] Taking Active   Oxymetazoline HCl  (RHOFADE) 1 % CREA [id number] Yes Qam to face Monique Bathe, [name] Taking Active   pregabalin (LYRICA) 100 MG capsule [id number] Yes Take 100 mg by mouth 3 (three) times daily. Provider, Historical, [name] Taking Active Self  Ruxolitinib Phosphate 1.5 % CREA [id number] Yes Apply topically. Provider, Historical, [name] Taking Active   tiZANidine (ZANAFLEX) 2 MG tablet [id number] Yes Take 1 tablet (2 mg total) by mouth at bedtime. Monique Sizer, [name] Taking Active   umeclidinium-vilanterol [facility] ELLIPTA) 62.5-25 MCG/ACT AEPB [id number] Yes Inhale 1 puff into the lungs daily. Monique Sizer, [name] Taking Active   vitamin B-12 (CYANOCOBALAMIN) 500 MCG tablet [id number] Yes Take 250 mcg by mouth every morning. 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 06/05/2021   Lumbosacral radiculopathy/radiculitis at L5 (Bilateral) 06/05/2021   PAD (peripheral artery disease) ([place]) 02/14/2021   CIN II (cervical intraepithelial neoplasia II) 02/01/2021   S/P vaginal hysterectomy 01/22/2021   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/09/2020   Bilateral leg pain 11/03/2019   Iron deficiency anemia 10/15/2019   Bilateral arm pain 08/05/2019   Bilateral hand numbness 08/05/2019   Weakness of both hands 08/05/2019   Chronic musculoskeletal pain 07/19/2019   Chronic neuropathic pain 07/19/2019   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   Special screening for malignant neoplasms, colon    Polyp of sigmoid colon    Hemorrhoids    Trigeminal neuralgia 10/05/2018   Lumbar radiculitis (Right) 09/24/2018   Hypocalcemia 06/03/2018   Vitamin D insufficiency 06/03/2018   Abnormal MRI, lumbar spine (06/02/2020)  06/03/2018   Chronic hip pain (Right) 06/03/2018   Spondylosis without myelopathy or radiculopathy, lumbar region 06/03/2018   DDD (degenerative disc disease), lumbar 06/02/2018   Lumbar facet hypertrophy 06/02/2018   Lumbar facet arthropathy 06/02/2018   Lumbar facet syndrome (Bilateral) (R>L) 06/02/2018   Marijuana use 05/19/2018   Chronic lower extremity pain (1ry area of Pain) (Bilateral) (R>L) 05/13/2018   Chronic low back pain (2ry area of Pain) (Bilateral) (R>L) w/ sciatica (Bilateral) 05/13/2018   Chronic pain syndrome 05/13/2018   Disorder of skeletal system 05/13/2018   Pharmacologic therapy 05/13/2018   Problems influencing health status 05/13/2018   Mastalgia 05/05/2018   Breast mass, right 01/07/2018   Face pain 12/11/2017   Tingling 12/11/2017   Thoracic aortic atherosclerosis ([place]) 08/20/2017   Emphysema of lung ([place]) 08/20/2017   Adductor tendinitis 04/23/2017   Trochanteric bursitis of right hip 04/23/2017   GERD without esophagitis 12/11/2016   CLL (chronic lymphocytic leukemia) ([place]) 11/24/2016   Stress incontinence 09/04/2016   B12 deficiency 07/10/2015   Insomnia 07/10/2015   Anorexia nervosa, restricting type 07/10/2015   Anxiety, generalized 07/10/2015   H/O suicide attempt 07/10/2015  Lymphocytosis 07/10/2015   Obsessive-compulsive disorder 07/10/2015   Tobacco use 07/10/2015   History of cervical dysplasia 07/18/2014   There are no care plans that you recently modified to display for this patient.   Follow up:  Patient requests no follow-up at this time.  Plan: The Managed Medicaid care management team is available to follow up with the patient after provider conversation with the patient regarding recommendation for care management engagement and subsequent re-referral to the care management team.   [name], BSW, MSW, LCSW Managed Medicaid [email or web address] Phone: [phone]

## 2022-05-01 NOTE — Patient Outreach (Deleted)
  Medicaid Managed Care   Unsuccessful Attempt Note   05/01/2022 Name: Monique [name] MRN: [id number] DOB: [birth year]  Referred by: [name], MD Reason for referral : High Risk Managed Medicaid   An unsuccessful telephone outreach was attempted today. The patient was referred to the case management team for assistance with care management and care coordination.    Follow Up Plan: The Managed Medicaid care management team is available to follow up with the patient after provider conversation with the patient regarding recommendation for care management engagement and subsequent re-referral to the care management team.    [name], BSW, MSW, LCSW Managed Medicaid LCSW [email or web address] Phone: [phone]

## 2022-05-03 ENCOUNTER — Other Ambulatory Visit: Payer: Self-pay | Admitting: Oncology

## 2022-05-03 DIAGNOSIS — C9110 Chronic lymphocytic leukemia of B-cell type not having achieved remission: Secondary | ICD-10-CM

## 2022-05-03 NOTE — Progress Notes (Signed)
OFF PATHWAY REGIMEN - Lymphoma and CLL  No Change  Continue With Treatment as Ordered.  Original Decision Date/Time: 02/19/2022 13:45   [id number]:Rituximab IV/SUBQ D1 q7 Days:   Cycle 1: A cycle is 7 days:     Rituximab-xxxx    Cycles 2 and beyond: A cycle is every 7 days:     Rituximab and hyaluronidase human   **Always confirm dose/schedule in your pharmacy ordering system**  Patient Characteristics: Chronic Lymphocytic Leukemia (CLL), Treatment Indicated, Third Line Disease Type: Chronic Lymphocytic Leukemia (CLL) Disease Type: Not Applicable Disease Type: Not Applicable Treatment Indicated<= Treatment Indicated Line of Therapy: Third Line Intent of Therapy: Non-Curative / Palliative Intent, Discussed with Patient

## 2022-05-13 ENCOUNTER — Other Ambulatory Visit: Payer: Self-pay | Admitting: Oncology

## 2022-05-13 DIAGNOSIS — C9110 Chronic lymphocytic leukemia of B-cell type not having achieved remission: Secondary | ICD-10-CM

## 2022-05-15 ENCOUNTER — Ambulatory Visit (INDEPENDENT_AMBULATORY_CARE_PROVIDER_SITE_OTHER): Payer: Medicare Other | Admitting: Dermatology

## 2022-05-15 ENCOUNTER — Encounter: Payer: Self-pay | Admitting: Oncology

## 2022-05-15 ENCOUNTER — Encounter: Payer: Self-pay | Admitting: Dermatology

## 2022-05-15 DIAGNOSIS — D224 Melanocytic nevi of scalp and neck: Secondary | ICD-10-CM | POA: Diagnosis not present

## 2022-05-15 DIAGNOSIS — W57XXXA Bitten or stung by nonvenomous insect and other nonvenomous arthropods, initial encounter: Secondary | ICD-10-CM

## 2022-05-15 DIAGNOSIS — L7211 Pilar cyst: Secondary | ICD-10-CM

## 2022-05-15 DIAGNOSIS — S80862A Insect bite (nonvenomous), left lower leg, initial encounter: Secondary | ICD-10-CM | POA: Diagnosis not present

## 2022-05-15 DIAGNOSIS — D492 Neoplasm of unspecified behavior of bone, soft tissue, and skin: Secondary | ICD-10-CM

## 2022-05-15 DIAGNOSIS — D2361 Other benign neoplasm of skin of right upper limb, including shoulder: Secondary | ICD-10-CM

## 2022-05-15 DIAGNOSIS — S80861A Insect bite (nonvenomous), right lower leg, initial encounter: Secondary | ICD-10-CM | POA: Diagnosis not present

## 2022-05-15 DIAGNOSIS — L578 Other skin changes due to chronic exposure to nonionizing radiation: Secondary | ICD-10-CM

## 2022-05-15 MED ORDER — CLOBETASOL PROPIONATE 0.05 % EX CREA: 1.0000 | TOPICAL_CREAM | CUTANEOUS | 1 refills | Status: DC

## 2022-05-15 NOTE — Patient Instructions (Addendum)
Wound Care Instructions  Cleanse wound gently with soap and water once a day then pat dry with clean gauze. Apply a thin coat of Petrolatum (petroleum jelly, "Vaseline") over the wound (unless you have an allergy to this). We recommend that you use a new, sterile tube of Vaseline. Do not pick or remove scabs. Do not remove the yellow or white "healing tissue" from the base of the wound.  Cover the wound with fresh, clean, nonstick gauze and secure with paper tape. You may use Band-Aids in place of gauze and tape if the wound is small enough, but would recommend trimming much of the tape off as there is often too much. Sometimes Band-Aids can irritate the skin.  You should call the office for your biopsy report after 1 week if you have not already been contacted.  If you experience any problems, such as abnormal amounts of bleeding, swelling, significant bruising, significant pain, or evidence of infection, please call the office immediately.  FOR ADULT SURGERY PATIENTS: If you need something for pain relief you may take 1 extra strength Tylenol (acetaminophen) AND 2 Ibuprofen ('200mg'$  each) together every 4 hours as needed for pain. (do not take these if you are allergic to them or if you have a reason you should not take them.) Typically, you may only need pain medication for 1 to 3 days.      Pre-Operative Instructions  You are scheduled for a surgical procedure at [facility]. We recommend you read the following instructions. If you have any questions or concerns, please call the office at [phone].  Shower and wash the entire body with soap and water the day of your surgery paying special attention to cleansing at and around the planned surgery site.  Avoid aspirin or aspirin containing products at least fourteen (14) days prior to your surgical procedure and for at least one week (7 Days) after your surgical procedure. If you take aspirin on a regular basis for heart disease or  history of stroke or for any other reason, we may recommend you continue taking aspirin but please notify us if you take this on a regular basis. Aspirin can cause more bleeding to occur during surgery as well as prolonged bleeding and bruising after surgery.   Avoid other nonsteroidal pain medications at least one week prior to surgery and at least one week prior to your surgery. These include medications such as Ibuprofen (Motrin, Advil and Nuprin), Naprosyn, Voltaren, Relafen, etc. If medications are used for therapeutic reasons, please inform us as they can cause increased bleeding or prolonged bleeding during and bruising after surgical procedures.   Please advise [place] if you are taking any "blood thinner" medications such as Coumadin or Dipyridamole or Plavix or similar medications. These cause increased bleeding and prolonged bleeding during procedures and bruising after surgical procedures. We may have to consider discontinuing these medications briefly prior to and shortly after your surgery if safe to do so.   Please inform us of all medications you are currently taking. All medications that are taken regularly should be taken the day of surgery as you always do. Nevertheless, we need to be informed of what medications you are taking prior to surgery to know whether they will affect the procedure or cause any complications.   Please inform us of any medication allergies. Also inform us of whether you have allergies to Latex or rubber products or whether you have had any adverse reaction to Lidocaine or Epinephrine.  Please  inform us of any prosthetic or artificial body parts such as artificial heart valve, joint replacements, etc., or similar condition that might require preoperative antibiotics.   We recommend avoidance of alcohol at least two weeks prior to surgery and continued avoidance for at least two weeks after surgery.   We recommend discontinuation of tobacco smoking at least two  weeks prior to surgery and continued abstinence for at least two weeks after surgery.  Do not plan strenuous exercise, strenuous work or strenuous lifting for approximately four weeks after your surgery.   We request if you are unable to make your scheduled surgical appointment, please call us at least a week in advance or as soon as you are aware of a problem so that we can cancel or reschedule the appointment.   You MAY TAKE TYLENOL (acetaminophen) for pain as it is not a blood thinner.   PLEASE PLAN TO BE IN TOWN FOR TWO WEEKS FOLLOWING SURGERY, THIS IS IMPORTANT SO YOU CAN BE CHECKED FOR DRESSING CHANGES, SUTURE REMOVAL AND TO MONITOR FOR POSSIBLE COMPLICATIONS.      Due to recent changes in healthcare laws, you may see results of your pathology and/or laboratory studies on MyChart before the doctors have had a chance to review them. We understand that in some cases there may be results that are confusing or concerning to you. Please understand that not all results are received at the same time and often the doctors may need to interpret multiple results in order to provide you with the best plan of care or course of treatment. Therefore, we ask that you please give [place] 2 business days to thoroughly review all your results before contacting the office for clarification. Should we see a critical lab result, you will be contacted sooner.   If You Need Anything After Your Visit  If you have any questions or concerns for your doctor, please call our main line at [phone] and press option 4 to reach your doctor's medical assistant. If no one answers, please leave a voicemail as directed and we will return your call as soon as possible. Messages left after 4 pm will be answered the following business day.   You may also send [place] a message via [place]. We typically respond to MyChart messages within 1-2 business days.  For prescription refills, please ask your pharmacy to contact our office. Our  fax number is [phone].  If you have an urgent issue when the clinic is closed that cannot wait until the next business day, you can page your doctor at the number below.    Please note that while we do our best to be available for urgent issues outside of office hours, we are not available 24/7.   If you have an urgent issue and are unable to reach [place], you may choose to seek medical care at your doctor's office, retail clinic, urgent care center, or emergency room.  If you have a medical emergency, please immediately call 911 or go to the emergency department.  Pager Numbers  - Dr. Nehemiah Massed: [phone]  - Dr. Laurence Ferrari: [phone]  - Dr. Nicole Kindred: [phone]  In the event of inclement weather, please call our main line at [phone] for an update on the status of any delays or closures.  Dermatology Medication Tips: Please keep the boxes that topical medications come in in order to help keep track of the instructions about where and how to use these. Pharmacies typically print the medication instructions only on the  boxes and not directly on the medication tubes.   If your medication is too expensive, please contact our office at [phone] option 4 or send [place] a message through [place].   We are unable to tell what your co-pay for medications will be in advance as this is different depending on your insurance coverage. However, we may be able to find a substitute medication at lower cost or fill out paperwork to get insurance to cover a needed medication.   If a prior authorization is required to get your medication covered by your insurance company, please allow [place] 1-2 business days to complete this process.  Drug prices often vary depending on where the prescription is filled and some pharmacies may offer cheaper prices.  The website www.goodrx.com contains coupons for medications through different pharmacies. The prices here do not account for what the cost may be with help  from insurance (it may be cheaper with your insurance), but the website can give you the price if you did not use any insurance.  - You can print the associated coupon and take it with your prescription to the pharmacy.  - You may also stop by our office during regular business hours and pick up a GoodRx coupon card.  - If you need your prescription sent electronically to a different pharmacy, notify our office through [facility] or by phone at [phone] option 4.     Si Usted Necesita Algo Despus de Su Visita  Tambin puede enviarnos un mensaje a travs de Pharmacist, community. Por lo general respondemos a los mensajes de MyChart en el transcurso de 1 a 2 das hbiles.  Para renovar recetas, por favor pida a su farmacia que se ponga en contacto con nuestra oficina. Harland Dingwall de fax es [place] [phone].  Si tiene un asunto urgente cuando la clnica est cerrada y que no puede esperar hasta el siguiente da hbil, puede llamar/localizar a su doctor(a) al nmero que aparece a continuacin.   Por favor, tenga en cuenta que aunque hacemos todo lo posible para estar disponibles para asuntos urgentes fuera del horario de [place], no estamos disponibles las 24 horas del da, los 7 das de la [place].   Si tiene un problema urgente y no puede comunicarse con nosotros, puede optar por buscar atencin mdica  en el consultorio de su doctor(a), en una clnica privada, en un centro de atencin urgente o en una sala de emergencias.  Si tiene Engineering geologist, por favor llame inmediatamente al 911 o vaya a la sala de emergencias.  Nmeros de bper  - Dr. Nehemiah Massed: [phone]  - Dra. Moye: [phone]  - Dra. Nicole Kindred: [phone]  En caso de inclemencias del [place], por favor llame a Johnsie Kindred principal al [phone] para una actualizacin sobre el [place] de cualquier retraso o cierre.  Consejos para la medicacin en dermatologa: Por favor, guarde las cajas en las que vienen los  medicamentos de uso tpico para ayudarle a seguir las instrucciones sobre dnde y cmo usarlos. Las farmacias generalmente imprimen las instrucciones del medicamento slo en las cajas y no directamente en los tubos del [place].   Si su medicamento es muy caro, por favor, pngase en contacto con Zigmund Daniel llamando al [phone] y presione la opcin 4 o envenos un mensaje a travs de Pharmacist, community.   No podemos decirle cul ser su copago por los medicamentos por adelantado ya que esto es diferente dependiendo de la cobertura de su seguro. Sin embargo, es posible que podamos  encontrar un medicamento sustituto a Electrical engineer un formulario para que el seguro cubra el medicamento que se considera necesario.   Si se requiere una autorizacin previa para que su compaa de seguros [place] su medicamento, por favor permtanos de 1 a 2 das hbiles para completar este proceso.  Los precios de los medicamentos varan con frecuencia dependiendo del Environmental consultant de dnde se surte la receta y alguna farmacias pueden ofrecer precios ms baratos.  El sitio web www.goodrx.com tiene cupones para medicamentos de Airline pilot. Los precios aqu no tienen en cuenta lo que podra costar con la ayuda del seguro (puede ser ms barato con su seguro), pero el sitio web puede darle el precio si no utiliz Research scientist (physical sciences).  - Puede imprimir el cupn correspondiente y llevarlo con su receta a la farmacia.  - Tambin puede pasar por nuestra oficina durante el horario de atencin regular y Charity fundraiser una tarjeta de cupones de GoodRx.  - Si necesita que su receta se enve electrnicamente a una farmacia diferente, informe a nuestra oficina a travs de MyChart de [place] o por telfono llamando al [phone] y presione la opcin 4.

## 2022-05-15 NOTE — Progress Notes (Signed)
Follow-Up Visit   Subjective  Monique [name] is a 51 y.o. female who presents for the following: calcium growth? (Post scalp, not sure how long she has had but for a while, irritating and growing), Nevus (Back, irritating/Post scalp), and bites (Legs, ). The patient has spots, moles and lesions to be evaluated, some may be new or changing and the patient has concerns that these could be cancer.  The following portions of the chart were reviewed this encounter and updated as appropriate:   Tobacco  Allergies  Meds  Problems  Med Hx  Surg Hx  Fam Hx     Review of Systems:  No other skin or systemic complaints except as noted in HPI or Assessment and Plan.  Objective  Well appearing patient in no apparent distress; mood and affect are within normal limits.  A focused examination was performed including scalp, neck, back, legs. Relevant physical exam findings are noted in the Assessment and Plan.  L post scalp 1.5cm cystic pap  R post shoulder Firm pink/brown papulenodule with dimple sign 0.7cm     R post neck Flesh pap 0.5cm     lower legs Crusted paps lower legs   Assessment & Plan  Pilar cyst L post scalp Symptomatic Discussed excising Benign-appearing. Exam most consistent with an epidermal inclusion cyst. Discussed that a cyst is a benign growth that can grow over time and sometimes get irritated or inflamed. Recommend observation if it is not bothersome. Discussed option of surgical excision to remove it if it is growing, symptomatic, or other changes noted. Please call for new or changing lesions so they can be evaluated.  Neoplasm of skin (2) R post shoulder Epidermal / dermal shaving Lesion diameter (cm):  0.7 Informed consent: discussed and consent obtained   Timeout: patient name, date of birth, surgical site, and procedure verified   Procedure prep:  Patient was prepped and draped in usual sterile fashion Prep type:  Isopropyl alcohol Anesthesia:  the lesion was anesthetized in a standard fashion   Anesthetic:  1% lidocaine w/ epinephrine 1-100,000 buffered w/ 8.4% NaHCO3 Instrument used: flexible razor blade   Hemostasis achieved with: pressure, aluminum chloride and electrodesiccation   Outcome: patient tolerated procedure well   Post-procedure details: sterile dressing applied and wound care instructions given   Dressing type: bandage and petrolatum    Specimen 2 - Surgical pathology Differential Diagnosis: D48.5 Irritated Dermatofibroma vs Nevus r/o Dysplasia Check Margins: No Firm pink/brown papulenodule with dimple sign 0.7cm  R post neck Epidermal / dermal shaving Lesion diameter (cm):  0.5 Informed consent: discussed and consent obtained   Timeout: patient name, date of birth, surgical site, and procedure verified   Procedure prep:  Patient was prepped and draped in usual sterile fashion Prep type:  Isopropyl alcohol Anesthesia: the lesion was anesthetized in a standard fashion   Anesthetic:  1% lidocaine w/ epinephrine 1-100,000 buffered w/ 8.4% NaHCO3 Instrument used: flexible razor blade   Hemostasis achieved with: pressure, aluminum chloride and electrodesiccation   Outcome: patient tolerated procedure well   Post-procedure details: sterile dressing applied and wound care instructions given   Dressing type: bandage and petrolatum    Specimen 1 - Surgical pathology Differential Diagnosis: D48.5 Irritated Nevus r/o Dysplasia Check Margins: No Flesh pap 0.5cm  A dermatofibroma is a benign growth possibly related to trauma, such as an insect bite or inflamed acne-type bump.  Discussed removal (shave vrs excision) with resulting scar and risk of recurrence.  Since not  bothersome, will observe for now.  Bug bite without infection, initial encounter lower legs Benign Start Clobetasol cr qd up to 5d/wk aa lower legs prn bites, avoid f/g/a  Topical steroids (such as triamcinolone, fluocinolone, fluocinonide,  mometasone, clobetasol, halobetasol, betamethasone, hydrocortisone) can cause thinning and lightening of the skin if they are used for too long in the same area. Your physician has selected the right strength medicine for your problem and area affected on the body. Please use your medication only as directed by your physician to prevent side effects.    clobetasol cream (TEMOVATE) 0.05 % - lower legs Apply 1 Application topically as directed. Qd up to 5 days a week to aa bites lower legs until clear, then prn flares  Actinic Damage - chronic, secondary to cumulative UV radiation exposure/sun exposure over time - diffuse scaly erythematous macules with underlying dyspigmentation - Recommend daily broad spectrum sunscreen SPF 30+ to sun-exposed areas, reapply every 2 hours as needed.  - Recommend staying in the shade or wearing long sleeves, sun glasses (UVA+UVB protection) and wide brim hats (4-inch brim around the entire circumference of the hat). - Call for new or changing lesions.  Melanocytic Nevi - Tan-brown and/or pink-flesh-colored symmetric macules and papules - Benign appearing on exam today - Observation - Call clinic for new or changing moles - Recommend daily use of broad spectrum spf 30+ sunscreen to sun-exposed areas.   Return for Surgery, Cyst scalp.  I, Monique [name], RMA, am acting as scribe for [name], MD . Documentation: I have reviewed the above documentation for accuracy and completeness, and I agree with the above.  [name], MD

## 2022-05-15 NOTE — Progress Notes (Signed)
[name]  [phone] [phone]  ID: Monique [name] OB: [birth year]  MR#: [id number]  YIA#:[id number]  Patient Care Team: [name], MD as PCP - General (Family Medicine) Lloyd Huger, MD as Consulting Physician (Oncology) Lane Hacker, [facility] as Pharmacist (Pharmacist) Greg Cutter, LCSW as [place] Management (Licensed Clinical Social Worker) Gloris Ham, RN as Registered Nurse (Oncology) Borders, Kirt Boys, NP as Nurse Practitioner (Hospice and Palliative Medicine)  CHIEF COMPLAINT: CLL, iron deficiency.  INTERVAL HISTORY: Patient returns clinic today for further evaluation, continuation of monthly Rituxan, and reinitiation of Revlimid.  Her rash has resolved with prednisone.  She currently feels well.  She has no neurologic complaints.  She denies any fevers or night sweats.  She has a good appetite and denies weight loss.  She has no chest pain, shortness of breath, cough, or hemoptysis.  She denies any nausea, vomiting, constipation, or diarrhea.  She has no melena or hematochezia.  She has no urinary complaints.  Patient offers no further specific complaints today.  REVIEW OF SYSTEMS:   Review of Systems  Constitutional: Negative.  Negative for diaphoresis, fever, malaise/fatigue and weight loss.  HENT: Negative.  Negative for congestion.   Respiratory: Negative.  Negative for cough and shortness of breath.   Cardiovascular: Negative.  Negative for chest pain and leg swelling.  Gastrointestinal:  Negative for abdominal pain and constipation.  Genitourinary: Negative.  Negative for dysuria and frequency.  Musculoskeletal: Negative.  Negative for back pain, myalgias and neck pain.  Skin: Negative.  Negative for rash.  Neurological: Negative.  Negative for tingling, sensory change, focal weakness and weakness.  Psychiatric/Behavioral: Negative.  Negative for depression and suicidal ideas. The  patient is not nervous/anxious.     As per HPI. Otherwise, a complete review of systems is negative.  PAST MEDICAL HISTORY: Past Medical History:  Diagnosis Date   Anxiety    Bursitis    leg pain   Carpal tunnel syndrome    Cervical dysplasia    hx LEEP over 18 years ago.    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  (in lymph nodes)   COPD (chronic obstructive pulmonary disease) ([place])    COVID-19 2021   Depression    Eating disorder    GERD (gastroesophageal reflux disease)    History of self-harm    Insomnia    Obsession    Tobacco abuse    Vitamin B12 deficiency (non anemic)     PAST SURGICAL HISTORY: Past Surgical History:  Procedure Laterality Date   BREAST BIOPSY Right 01/05/2018   US guided biopsy of 2 areas and 1 lymph node, MIXED INFLAMMATION AND GIANT CELL REACTION   CERVICAL BIOPSY  W/ LOOP ELECTRODE EXCISION     COLONOSCOPY WITH PROPOFOL N/A 04/21/2019   Procedure: COLONOSCOPY WITH PROPOFOL;  Surgeon: Virgel Manifold, MD;  Location: [facility] ENDOSCOPY;  Service: Gastroenterology;  Laterality: N/A;   OTHER SURGICAL HISTORY     scar tissue removed from vocal cords   TUBAL LIGATION     VAGINAL HYSTERECTOMY N/A 01/22/2021   Procedure: HYSTERECTOMY VAGINAL; BILATERAL SALPINGECTOMY;  Surgeon: Rubie Maid, MD;  Location: [facility] ORS;  Service: Gynecology;  Laterality: N/A;   vocal cord surgery  2005    FAMILY HISTORY: Family History  Problem Relation Age of Onset   Depression Mother    Cancer Mother        thyroid   Alcohol abuse Father  Alcohol abuse Brother    Depression Brother    Bipolar disorder Brother    Suicidality Brother    ADD / ADHD Son    Breast cancer Neg Hx     ADVANCED DIRECTIVES (Y/N):  N  HEALTH MAINTENANCE: Social History   Tobacco Use   Smoking status: Every Day    Packs/day: 1.00    Years: 31.00    Total pack years: 31.00    Types: Cigarettes    Start date: 09/25/1986   Smokeless tobacco: Never  Vaping Use    Vaping Use: Former  Substance Use Topics   Alcohol use: Not Currently    Alcohol/week: 0.0 standard drinks of alcohol    Comment: rarely   Drug use: Yes    Frequency: 7.0 times per week    Types: Marijuana     Colonoscopy:  PAP:  Bone density:  Lipid panel:  No Known Allergies  Current Outpatient Medications  Medication Sig Dispense Refill   Ascorbic Acid (VITAMIN C) 1000 MG tablet Take 1,000 mg by mouth daily as needed (immune health).     ASPIRIN 81 PO Take 81 mg by mouth daily.     atorvastatin (LIPITOR) 10 MG tablet Take 1 tablet (10 mg total) by mouth in the morning. 90 tablet 1   Calcium Carbonate-Vit D-Min (GNP CALCIUM 1200) 1200-1000 MG-UNIT CHEW Chew 1,200 mg by mouth daily with breakfast. Take in combination with vitamin D and magnesium. (Patient taking differently: Chew 2 tablets by mouth in the morning, at noon, and at bedtime. Take in combination with vitamin D and magnesium.) 90 tablet 3   DULoxetine (CYMBALTA) 60 MG capsule Take 1 capsule (60 mg total) by mouth daily. 90 capsule 1   estrogens, conjugated, (PREMARIN) 0.625 MG tablet Take 1 tablet (0.625 mg total) by mouth daily. Take daily for 21 days then do not take for 7 days. 90 tablet 3   hydrocortisone cream 1 % APPLY TOPICALLY TWICE DAILY 30 g 0   loratadine (CLARITIN) 10 MG tablet Take 1 tablet (10 mg total) by mouth every morning. 90 tablet 1   Magnesium 500 MG CAPS Take 500 mg by mouth in the morning.     meloxicam (MOBIC) 15 MG tablet Take 1 tablet (15 mg total) by mouth daily. 90 tablet 1   mupirocin ointment (BACTROBAN) 2 % Apply 1 Application topically 2 (two) times daily. 22 g 0   omeprazole (PRILOSEC) 20 MG capsule Take 1 capsule (20 mg total) by mouth every morning. 90 capsule 3   ondansetron (ZOFRAN) 8 MG tablet Take by mouth every 8 (eight) hours as needed.     Oxymetazoline HCl (RHOFADE) 1 % CREA Qam to face 30 g 11   predniSONE (DELTASONE) 5 MG tablet Take 1 tablet (5 mg total) by mouth daily with  breakfast. To be taken daily following the prednisone taper prescription. 30 tablet 2   pregabalin (LYRICA) 100 MG capsule Take 100 mg by mouth 3 (three) times daily.     Ruxolitinib Phosphate 1.5 % CREA Apply topically.     tiZANidine (ZANAFLEX) 2 MG tablet Take 1 tablet (2 mg total) by mouth at bedtime. 90 tablet 1   umeclidinium-vilanterol (ANORO ELLIPTA) 62.5-25 MCG/ACT AEPB Inhale 1 puff into the lungs daily. 60 each 5   vitamin B-12 (CYANOCOBALAMIN) 500 MCG tablet Take 250 mcg by mouth every morning.     ARIPiprazole (ABILIFY) 5 MG tablet Take 1 tablet (5 mg total) by mouth daily. 90 tablet 1  clobetasol cream (TEMOVATE) 9.93 % Apply 1 Application topically as directed. Qd up to 5 days a week to aa bites lower legs until clear, then prn flares (Patient not taking: Reported on 05/16/2022) 45 g 1   Crisaborole 2 % OINT Apply topically. (Patient not taking: Reported on 05/16/2022)     lenalidomide (REVLIMID) 10 MG capsule Take 1 capsule (10 mg total) by mouth daily. (Patient not taking: Reported on 05/16/2022) 28 capsule 0   No current facility-administered medications for this visit.    OBJECTIVE: Vitals:   05/16/22 0922  BP: 110/60  Pulse: 73  Resp: 16  Temp: (!) 97.5 F (36.4 C)  SpO2: 100%      Body mass index is 27.41 kg/m.    ECOG FS:0 - Asymptomatic  General: Well-developed, well-nourished, no acute distress. Eyes: Pink conjunctiva, anicteric sclera. HEENT: Normocephalic, moist mucous membranes. Lungs: No audible wheezing or coughing. Heart: Regular rate and rhythm. Abdomen: Soft, nontender, no obvious distention. Musculoskeletal: No edema, cyanosis, or clubbing. Neuro: Alert, answering all questions appropriately. Cranial nerves grossly intact. Skin: No rashes or petechiae noted. Psych: Normal affect.   LAB RESULTS:  Lab Results  Component Value Date   NA 138 05/16/2022   K 4.3 05/16/2022   CL 106 05/16/2022   CO2 24 05/16/2022   GLUCOSE 114 (H) 05/16/2022    BUN 19 05/16/2022   CREATININE 1.03 (H) 05/16/2022   CALCIUM 8.7 (L) 05/16/2022   PROT 6.4 (L) 05/16/2022   ALBUMIN 3.8 05/16/2022   AST 26 05/16/2022   ALT 28 05/16/2022   ALKPHOS 63 05/16/2022   BILITOT 0.5 05/16/2022   GFRNONAA >60 05/16/2022   GFRAA 79 12/06/2020    Lab Results  Component Value Date   WBC 12.7 (H) 05/16/2022   NEUTROABS 3.6 05/16/2022   HGB 15.1 (H) 05/16/2022   HCT 45.2 05/16/2022   MCV 99.6 05/16/2022   PLT 182 05/16/2022     STUDIES: No results found.  ONCOLOGY HISTORY:  Patient completed cycle 3 of Rituxan plus Treanda on March 26, 2018, but was then noted to have progressive disease.  She was then initiated on 480 mg Imbruvica in November 2019, but had progression of disease with increasing white blood cell count as well as CT scan on Jan 22, 2022 revealing increased lymphadenopathy consistent with progression of disease.  Attempted patient on venetoclax, but she could not tolerate it secondary to persistent diarrhea and transaminitis.   ASSESSMENT: CLL,  iron deficiency.  PLAN:    1. CLL: Confirmed by peripheral blood flow cytometry.  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  Patient recently completed 4 weekly cycles of Rituxan and now has transition to treatment every 4 weeks.  Patient's rash has resolved with prednisone and she has been instructed to continue 5 mg prednisone daily.  Reinitiate dose reduced 10 mg Revlimid daily today as well.  Proceed with Rituxan.  Return to clinic in 4 weeks for further evaluation and consideration of cycle 7.  Appreciate clinical pharmacy input.   2.  Leukocytosis: Mild.  Proceed with treatment as above. 3.  Trigeminal neuralgia: Patient does not complain of this today.  Appears to be unrelated to CLL or Imbruvica.  MRI of the brain on Jan 25, 2019 was unremarkable.   4.  Depression/anxiety: Chronic and unchanged.  Continue follow-up and  treatment per primary care. 5.  Neuropathic pain/peripheral neuropathy: Chronic and unchanged.  Patient reports she is now on disability.  Continue current follow-up and treatment as per neurology. 6.  Iron deficiency: Patient now has mild polycythemia. 7.  Rituxan reaction: Patient has been given additional premedications for preventative measures.  She did not have a reaction during cycle 2 or 3. 8.  Thrombocytopenia: Resolved. 9.  Rash: Unclear if related to Revlimid.  5 mg prednisone daily as above.  If patient remains on prednisone long-term, she will require bone mineral density   Patient expressed understanding and was in agreement with this plan. She also understands that She can call clinic at any time with any questions, concerns, or complaints.    Lloyd Huger, MD   05/18/2022 6:41 AM

## 2022-05-16 ENCOUNTER — Inpatient Hospital Stay: Payer: Medicare Other

## 2022-05-16 ENCOUNTER — Inpatient Hospital Stay: Payer: Medicare Other | Admitting: Pharmacist

## 2022-05-16 ENCOUNTER — Telehealth: Payer: Self-pay

## 2022-05-16 ENCOUNTER — Encounter: Payer: Self-pay | Admitting: Oncology

## 2022-05-16 ENCOUNTER — Inpatient Hospital Stay (HOSPITAL_BASED_OUTPATIENT_CLINIC_OR_DEPARTMENT_OTHER): Payer: Medicare Other | Admitting: Oncology

## 2022-05-16 VITALS — BP 108/65 | HR 72 | Resp 18

## 2022-05-16 VITALS — BP 110/60 | HR 73 | Temp 97.5°F | Resp 16 | Wt 175.0 lb

## 2022-05-16 DIAGNOSIS — C9110 Chronic lymphocytic leukemia of B-cell type not having achieved remission: Secondary | ICD-10-CM

## 2022-05-16 LAB — CBC WITH DIFFERENTIAL/PLATELET
Abs Immature Granulocytes: 0 10*3/uL (ref 0.00–0.07)
Basophils Absolute: 0 10*3/uL (ref 0.0–0.1)
Basophils Relative: 0 %
Eosinophils Absolute: 0.3 10*3/uL (ref 0.0–0.5)
Eosinophils Relative: 2 %
HCT: 45.2 % (ref 36.0–46.0)
Hemoglobin: 15.1 g/dL — ABNORMAL HIGH (ref 12.0–15.0)
Lymphocytes Relative: 68 %
Lymphs Abs: 8.6 10*3/uL — ABNORMAL HIGH (ref 0.7–4.0)
MCH: 33.3 pg (ref 26.0–34.0)
MCHC: 33.4 g/dL (ref 30.0–36.0)
MCV: 99.6 fL (ref 80.0–100.0)
Monocytes Absolute: 0.3 10*3/uL (ref 0.1–1.0)
Monocytes Relative: 2 %
Neutro Abs: 3.6 10*3/uL (ref 1.7–7.7)
Neutrophils Relative %: 28 %
Platelets: 182 10*3/uL (ref 150–400)
RBC: 4.54 MIL/uL (ref 3.87–5.11)
RDW: 13.7 % (ref 11.5–15.5)
Smear Review: NORMAL
WBC: 12.7 10*3/uL — ABNORMAL HIGH (ref 4.0–10.5)
nRBC: 0 % (ref 0.0–0.2)

## 2022-05-16 LAB — COMPREHENSIVE METABOLIC PANEL
ALT: 28 U/L (ref 0–44)
AST: 26 U/L (ref 15–41)
Albumin: 3.8 g/dL (ref 3.5–5.0)
Alkaline Phosphatase: 63 U/L (ref 38–126)
Anion gap: 8 (ref 5–15)
BUN: 19 mg/dL (ref 6–20)
CO2: 24 mmol/L (ref 22–32)
Calcium: 8.7 mg/dL — ABNORMAL LOW (ref 8.9–10.3)
Chloride: 106 mmol/L (ref 98–111)
Creatinine, Ser: 1.03 mg/dL — ABNORMAL HIGH (ref 0.44–1.00)
GFR, Estimated: >60 mL/min (ref >60)
Glucose, Bld: 114 mg/dL — ABNORMAL HIGH (ref 70–99)
Potassium: 4.3 mmol/L (ref 3.5–5.1)
Sodium: 138 mmol/L (ref 135–145)
Total Bilirubin: 0.5 mg/dL (ref 0.3–1.2)
Total Protein: 6.4 g/dL — ABNORMAL LOW (ref 6.5–8.1)

## 2022-05-16 MED ORDER — FAMOTIDINE IN NACL 20-0.9 MG/50ML-% IV SOLN
20.0000 mg | Freq: Once | INTRAVENOUS | Status: AC
  Administered 2022-05-16: 20 mg via INTRAVENOUS
  Filled 2022-05-16: qty 50

## 2022-05-16 MED ORDER — SODIUM CHLORIDE 0.9 % IV SOLN
375.0000 mg/m2 | Freq: Once | INTRAVENOUS | Status: AC
  Administered 2022-05-16: 700 mg via INTRAVENOUS
  Filled 2022-05-16: qty 50

## 2022-05-16 MED ORDER — DIPHENHYDRAMINE HCL 25 MG PO CAPS
50.0000 mg | ORAL_CAPSULE | Freq: Once | ORAL | Status: AC
  Administered 2022-05-16: 50 mg via ORAL
  Filled 2022-05-16: qty 2

## 2022-05-16 MED ORDER — SODIUM CHLORIDE 0.9 % IV SOLN
20.0000 mg | Freq: Once | INTRAVENOUS | Status: AC
  Administered 2022-05-16: 20 mg via INTRAVENOUS
  Filled 2022-05-16: qty 20

## 2022-05-16 MED ORDER — ACETAMINOPHEN 325 MG PO TABS
650.0000 mg | ORAL_TABLET | Freq: Once | ORAL | Status: AC
  Administered 2022-05-16: 650 mg via ORAL
  Filled 2022-05-16: qty 2

## 2022-05-16 MED ORDER — SODIUM CHLORIDE 0.9 % IV SOLN
Freq: Once | INTRAVENOUS | Status: AC
  Filled 2022-05-16: qty 250

## 2022-05-16 NOTE — Telephone Encounter (Signed)
-----   Message from [name], MD sent at 05/16/2022  5:42 PM EDT ----- Diagnosis 1. Skin , right post neck MELANOCYTIC NEVUS, INTRADERMAL TYPE, BASE INVOLVED 2. Skin , right post shoulder CELLULAR DERMATOFIBROMA, BASE INVOLVED  1- benign mole No further treatment needed 2- Benign dermatofibroma - cellular type May recur May need additional treatment if recurs

## 2022-05-16 NOTE — Patient Instructions (Signed)
[facility] AT [place]  Discharge Instructions: Thank you for choosing [facility] to provide your oncology and hematology care.  If you have a lab appointment with the [facility], please go directly to the [facility] and check in at the registration area.  Wear comfortable clothing and clothing appropriate for easy access to any Portacath or PICC line.   We strive to give you quality time with your provider. You may need to reschedule your appointment if you arrive late (15 or more minutes).  Arriving late affects you and other patients whose appointments are after yours.  Also, if you miss three or more appointments without notifying the office, you may be dismissed from the clinic at the provider's discretion.      For prescription refill requests, have your pharmacy contact our office and allow 72 hours for refills to be completed.    Today you received the following chemotherapy and/or immunotherapy agents RUXIENCE      To help prevent nausea and vomiting after your treatment, we encourage you to take your nausea medication as directed.  BELOW ARE SYMPTOMS THAT SHOULD BE REPORTED IMMEDIATELY: *FEVER GREATER THAN 100.4 F (38 C) OR HIGHER *CHILLS OR SWEATING *NAUSEA AND VOMITING THAT IS NOT CONTROLLED WITH YOUR NAUSEA MEDICATION *UNUSUAL SHORTNESS OF BREATH *UNUSUAL BRUISING OR BLEEDING *URINARY PROBLEMS (pain or burning when urinating, or frequent urination) *BOWEL PROBLEMS (unusual diarrhea, constipation, pain near the anus) TENDERNESS IN MOUTH AND THROAT WITH OR WITHOUT PRESENCE OF ULCERS (sore throat, sores in mouth, or a toothache) UNUSUAL RASH, SWELLING OR PAIN  UNUSUAL VAGINAL DISCHARGE OR ITCHING   Items with * indicate a potential emergency and should be followed up as soon as possible or go to the Emergency Department if any problems should occur.  Please show the CHEMOTHERAPY ALERT CARD or IMMUNOTHERAPY ALERT CARD at check-in to  the Emergency Department and triage nurse.  Should you have questions after your visit or need to cancel or reschedule your appointment, please contact [facility] [place] AT [place]  [phone] and follow the prompts.  Office hours are 8:00 a.m. to 4:30 p.m. Monday - Friday. Please note that voicemails left after 4:00 p.m. may not be returned until the following business day.  We are closed weekends and major holidays. You have access to a nurse at all times for urgent questions. Please call the main number to the clinic [phone] and follow the prompts.  For any non-urgent questions, you may also contact your provider using MyChart. We now offer e-Visits for anyone 53 and older to request care online for non-urgent symptoms. For details visit [email or web address].   Also download the MyChart app! Go to the app store, search "MyChart", open the app, select [facility], and log in with your MyChart username and password.  Masks are optional in the cancer centers. If you would like for your care team to wear a mask while they are taking care of you, please let them know. For doctor visits, patients may have with them one support person who is at least 53 years old. At this time, visitors are not allowed in the infusion area.  Rituximab Injection What is this medication? RITUXIMAB (ri TUX i mab) treats leukemia and lymphoma. It works by blocking a protein that causes cancer cells to grow and multiply. This helps to slow or stop the spread of cancer cells. It may also be used to treat autoimmune conditions, such as arthritis. It works by slowing  down an overactive immune system. It is a monoclonal antibody. This medicine may be used for other purposes; ask your health care provider or pharmacist if you have questions. COMMON BRAND NAME(S): RIABNI, Rituxan, RUXIENCE, truxima What should I tell my care team before I take this medication? They need to know if you have any of these  conditions: Chest pain Heart disease Immune system problems Infection, such as chickenpox, cold sores, hepatitis B, herpes Irregular heartbeat or rhythm Kidney disease Low blood counts, such as low white cells, platelets, red cells Lung disease Recent or upcoming vaccine An unusual or allergic reaction to rituximab, other medications, foods, dyes, or preservatives Pregnant or trying to get pregnant Breast-feeding How should I use this medication? This medication is injected into a vein. It is given by a care team in a hospital or clinic setting. A special MedGuide will be given to you before each treatment. Be sure to read this information carefully each time. Talk to your care team about the use of this medication in children. While this medication may be prescribed for children as young as 6 months for selected conditions, precautions do apply. Overdosage: If you think you have taken too much of this medicine contact a poison control center or emergency room at once. NOTE: This medicine is only for you. Do not share this medicine with others. What if I miss a dose? Keep appointments for follow-up doses. It is important not to miss your dose. Call your care team if you are unable to keep an appointment. What may interact with this medication? Do not take this medication with any of the following: Live vaccines This medication may also interact with the following: Cisplatin This list may not describe all possible interactions. Give your health care provider a list of all the medicines, herbs, non-prescription drugs, or dietary supplements you use. Also tell them if you smoke, drink alcohol, or use illegal drugs. Some items may interact with your medicine. What should I watch for while using this medication? Your condition will be monitored carefully while you are receiving this medication. You may need blood work while taking this medication. This medication can cause serious infusion  reactions. To reduce the risk your care team may give you other medications to take before receiving this one. Be sure to follow the directions from your care team. This medication may increase your risk of getting an infection. Call your care team for advice if you get a fever, chills, sore throat, or other symptoms of a cold or flu. Do not treat yourself. Try to avoid being around people who are sick. Call your care team if you are around anyone with measles, chickenpox, or if you develop sores or blisters that do not heal properly. Avoid taking medications that contain aspirin, acetaminophen, ibuprofen, naproxen, or ketoprofen unless instructed by your care team. These medications may hide a fever. This medication may cause serious skin reactions. They can happen weeks to months after starting the medication. Contact your care team right away if you notice fevers or flu-like symptoms with a rash. The rash may be red or purple and then turn into blisters or peeling of the skin. You may also notice a red rash with swelling of the face, lips, or lymph nodes in your neck or under your arms. In some patients, this medication may cause a serious brain infection that may cause death. If you have any problems seeing, thinking, speaking, walking, or standing, tell your care team right away.  If you cannot reach your care team, urgently seek another source of medical care. Talk to your care team if you may be pregnant. Serious birth defects can occur if you take this medication during pregnancy and for 12 months after the last dose. You will need a negative pregnancy test before starting this medication. Contraception is recommended while taking this medication and for 12 months after the last dose. Your care team can help you find the option that works for you. Do not breastfeed while taking this medication and for at least 6 months after the last dose. What side effects may I notice from receiving this  medication? Side effects that you should report to your care team as soon as possible: Allergic reactions or angioedema--skin rash, itching or hives, swelling of the face, eyes, lips, tongue, arms, or legs, trouble swallowing or breathing Bowel blockage--stomach cramping, unable to have a bowel movement or pass gas, loss of appetite, vomiting Dizziness, loss of balance or coordination, confusion or trouble speaking Heart attack--pain or tightness in the chest, shoulders, arms, or jaw, nausea, shortness of breath, cold or clammy skin, feeling faint or lightheaded Heart rhythm changes--fast or irregular heartbeat, dizziness, feeling faint or lightheaded, chest pain, trouble breathing Infection--fever, chills, cough, sore throat, wounds that don't heal, pain or trouble when passing urine, general feeling of discomfort or being unwell Infusion reactions--chest pain, shortness of breath or trouble breathing, feeling faint or lightheaded Kidney injury--decrease in the amount of urine, swelling of the ankles, hands, or feet Liver injury--right upper belly pain, loss of appetite, nausea, light-colored stool, dark yellow or brown urine, yellowing skin or eyes, unusual weakness or fatigue Redness, blistering, peeling, or loosening of the skin, including inside the mouth Stomach pain that is severe, does not go away, or gets worse Tumor lysis syndrome (TLS)--nausea, vomiting, diarrhea, decrease in the amount of urine, dark urine, unusual weakness or fatigue, confusion, muscle pain or cramps, fast or irregular heartbeat, joint pain Side effects that usually do not require medical attention (report to your care team if they continue or are bothersome): Headache Joint pain Nausea Runny or stuffy nose Unusual weakness or fatigue This list may not describe all possible side effects. Call your doctor for medical advice about side effects. You may report side effects to FDA at 1-800-FDA-1088. Where should I keep  my medication? This medication is given in a hospital or clinic. It will not be stored at home. NOTE: This sheet is a summary. It may not cover all possible information. If you have questions about this medicine, talk to your doctor, pharmacist, or health care provider.  2023 Elsevier/Gold Standard (2022-01-21 00:00:00)

## 2022-05-16 NOTE — Telephone Encounter (Signed)
Advised patient of results/hd  

## 2022-05-17 NOTE — Progress Notes (Addendum)
[place]  Telephone:([phone] [phone]  Patient Care Team: [name], MD as PCP - General (Family Medicine) Lloyd Huger, MD as Consulting Physician (Oncology) Lane Hacker, [facility] as Pharmacist (Pharmacist) Greg Cutter, LCSW as [place] [employer] (Licensed Clinical Social Worker) Gloris Ham, RN as Registered Nurse (Oncology) Borders, Kirt Boys, NP as Nurse Practitioner [facility] and Palliative Medicine)   Name of the patient: Monique [name]  [id number]  1970/10/05   Date of visit: 05/16/22  HPI: Patient is a 51 y.o. female with  progressive CLL, previously treated with ibrutinib. Patient was intolerant to venetoclax treatment initiation and did not want to retry initiating venetoclax. The plan is to now treat her with rituximab and lenalidomide. Patient started rituximab on 02/28/22 and lenalidomide 03/01/22.  On 04/03/22, patient presented to clinic with a rash. Her lenalidomide was held and she was treated with prednisone (1 week) and loratadine continuosly. Rash improved by 04/10/22 f/u appt and she was restarted on lenalidomide.  On 04/28/22, patient called to report her rash had worsened and she stopped the lenalidomide on 04/23/22. She was stated on another prednisone taper. She was to taper down to '5mg'$  daily, then resume her lenalidomide. Today 05/16/22, she reported not yet resuming her lenalidomide.   Reason for Consult: Oral chemotherapy follow-up for lenalidomide therapy.   PAST MEDICAL HISTORY: Past Medical History:  Diagnosis Date   Anxiety    Bursitis    leg pain   Carpal tunnel syndrome    Cervical dysplasia    hx LEEP over 18 years ago.    Chronic lymphocytic leukemia ([place]) 2018   Dr Grayland Ormond.  (in lymph nodes)   COPD (chronic obstructive pulmonary disease) ([place])    COVID-19 2021   Depression    Eating disorder    GERD (gastroesophageal reflux disease)    History  of self-harm    Insomnia    Obsession    Tobacco abuse    Vitamin B12 deficiency (non anemic)     HEMATOLOGY/ONCOLOGY HISTORY:  Oncology History Overview Note  Patient with recent CLL diagnosis by flow cytometry.  Previously not being treated in observation.   S/p 4 cycles of weekly Rituxan completing on 02/20/2017 scans from January 2018 revealed minimal progression.   Developed right breast mass in March 2019.  Had diagnostic mammogram performed on 12/26/2017 showed suspicious right breast mass.  Biopsy revealed granulomatous mastitis.  She was treated with doxycycline 100 mg twice daily for 7 days.   Developed worsening adenopathy.  CT chest/abdomen/pelvis and soft tissue neck from May 2019 revealed progressive bulky adenopathy in the chest, abdomen and pelvis compatible with worsening disease.   [name] recommended beginning treatment with Rituxan and Treanda.  Received first cycle last week.  S/p 6 cycles of Rituxan and Treanda.  Tolerating well.  Last dose given 03/25/18.   CLL (chronic lymphocytic leukemia) ([place])  11/24/2016 Initial Diagnosis   CLL (chronic lymphocytic leukemia) ([place])   01/21/2018 - 03/26/2018 Chemotherapy   The patient had palonosetron (ALOXI) injection 0.25 mg, 0.25 mg, Intravenous,  Once, 4 of 5 cycles Administration: 0.25 mg (01/28/2018), 0.25 mg (02/25/2018), 0.25 mg (03/25/2018) riTUXimab (RITUXAN) 700 mg in sodium chloride 0.9 % 250 mL (2.1875 mg/mL) infusion, 375 mg/m2 = 700 mg, Intravenous,  Once, 4 of 5 cycles Administration: 700 mg (01/28/2018) bendamustine (BENDEKA) 150 mg in sodium chloride 0.9 % 50 mL (2.6786 mg/mL) chemo infusion, 90 mg/m2 = 150 mg, Intravenous,  Once, 4 of 5 cycles Administration: 150 mg (01/28/2018), 150 mg (01/29/2018), 150 mg (02/25/2018), 150 mg (02/26/2018), 150 mg (03/25/2018), 150 mg (03/26/2018)  for chemotherapy treatment.    02/28/2022 - 04/18/2022 Chemotherapy   Patient is on Treatment Plan : NON-HODGKINS LYMPHOMA Rituximab q7d      02/28/2022 -  Chemotherapy   Patient is on Treatment Plan : NON-HODGKINS LYMPHOMA Rituximab q7d     03/12/2022 - 03/12/2022 Chemotherapy   Patient is on Treatment Plan : LYMPHOMA CLL/SLL Venetoclax ramp up / Venetoclax + Rituximab (375/500) q28d x 6 cycles / Venetoclax q28d x 18 cycles       ALLERGIES:  has No Known Allergies.  MEDICATIONS:  Current Outpatient Medications  Medication Sig Dispense Refill   ARIPiprazole (ABILIFY) 5 MG tablet Take 1 tablet (5 mg total) by mouth daily. 90 tablet 1   Ascorbic Acid (VITAMIN C) 1000 MG tablet Take 1,000 mg by mouth daily as needed (immune health).     ASPIRIN 81 PO Take 81 mg by mouth daily.     atorvastatin (LIPITOR) 10 MG tablet Take 1 tablet (10 mg total) by mouth in the morning. 90 tablet 1   Calcium Carbonate-Vit D-Min (GNP CALCIUM 1200) 1200-1000 MG-UNIT CHEW Chew 1,200 mg by mouth daily with breakfast. Take in combination with vitamin D and magnesium. (Patient taking differently: Chew 2 tablets by mouth in the morning, at noon, and at bedtime. Take in combination with vitamin D and magnesium.) 90 tablet 3   clobetasol cream (TEMOVATE) 6.60 % Apply 1 Application topically as directed. Qd up to 5 days a week to aa bites lower legs until clear, then prn flares (Patient not taking: Reported on 05/16/2022) 45 g 1   Crisaborole 2 % OINT Apply topically. (Patient not taking: Reported on 05/16/2022)     DULoxetine (CYMBALTA) 60 MG capsule Take 1 capsule (60 mg total) by mouth daily. 90 capsule 1   estrogens, conjugated, (PREMARIN) 0.625 MG tablet Take 1 tablet (0.625 mg total) by mouth daily. Take daily for 21 days then do not take for 7 days. 90 tablet 3   hydrocortisone cream 1 % APPLY TOPICALLY TWICE DAILY 30 g 0   lenalidomide (REVLIMID) 10 MG capsule Take 1 capsule (10 mg total) by mouth daily. (Patient not taking: Reported on 05/16/2022) 28 capsule 0   loratadine (CLARITIN) 10 MG tablet Take 1 tablet (10 mg total) by mouth every morning. 90 tablet  1   Magnesium 500 MG CAPS Take 500 mg by mouth in the morning.     meloxicam (MOBIC) 15 MG tablet Take 1 tablet (15 mg total) by mouth daily. 90 tablet 1   mupirocin ointment (BACTROBAN) 2 % Apply 1 Application topically 2 (two) times daily. 22 g 0   omeprazole (PRILOSEC) 20 MG capsule Take 1 capsule (20 mg total) by mouth every morning. 90 capsule 3   ondansetron (ZOFRAN) 8 MG tablet Take by mouth every 8 (eight) hours as needed.     Oxymetazoline HCl (RHOFADE) 1 % CREA Qam to face 30 g 11   predniSONE (DELTASONE) 5 MG tablet Take 1 tablet (5 mg total) by mouth daily with breakfast. To be taken daily following the prednisone taper prescription. 30 tablet 2   pregabalin (LYRICA) 100 MG capsule Take 100 mg by mouth 3 (three) times daily.     Ruxolitinib Phosphate 1.5 % CREA Apply topically.     tiZANidine (ZANAFLEX) 2 MG tablet Take 1 tablet (2 mg total) by mouth at  bedtime. 90 tablet 1   umeclidinium-vilanterol (ANORO ELLIPTA) 62.5-25 MCG/ACT AEPB Inhale 1 puff into the lungs daily. 60 each 5   vitamin B-12 (CYANOCOBALAMIN) 500 MCG tablet Take 250 mcg by mouth every morning.     No current facility-administered medications for this visit.    VITAL SIGNS: LMP 08/17/2016  There were no vitals filed for this visit.  Estimated body mass index is 27.41 kg/m as calculated from the following:   Height as of 04/18/22: '5\' 7"'$  (1.702 m).   Weight as of an earlier encounter on 05/16/22: 79.4 kg (175 lb).  LABS: CBC:    Component Value Date/Time   WBC 12.7 (H) 05/16/2022 0847   HGB 15.1 (H) 05/16/2022 0847   HCT 45.2 05/16/2022 0847   PLT 182 05/16/2022 0847   MCV 99.6 05/16/2022 0847   NEUTROABS 3.6 05/16/2022 0847   LYMPHSABS 8.6 (H) 05/16/2022 0847   MONOABS 0.3 05/16/2022 0847   EOSABS 0.3 05/16/2022 0847   BASOSABS 0.0 05/16/2022 0847   Comprehensive Metabolic Panel:    Component Value Date/Time   NA 138 05/16/2022 0847   NA 138 05/13/2018 1413   K 4.3 05/16/2022 0847   CL 106  05/16/2022 0847   CO2 24 05/16/2022 0847   BUN 19 05/16/2022 0847   BUN 15 05/13/2018 1413   CREATININE 1.03 (H) 05/16/2022 0847   CREATININE 0.96 12/06/2020 1646   GLUCOSE 114 (H) 05/16/2022 0847   CALCIUM 8.7 (L) 05/16/2022 0847   AST 26 05/16/2022 0847   ALT 28 05/16/2022 0847   ALKPHOS 63 05/16/2022 0847   BILITOT 0.5 05/16/2022 0847   BILITOT 0.4 05/13/2018 1413   PROT 6.4 (L) 05/16/2022 0847   PROT 6.3 05/13/2018 1413   ALBUMIN 3.8 05/16/2022 0847   ALBUMIN 4.1 05/13/2018 1413     Present during today's visit: patient only  Assessment and Plan: Rash has cleared, she will restart on the lenalidomide today 05/16/22   Oral Chemotherapy Side Effect/Intolerance:  N/A off the therapy currently  Oral Chemotherapy Adherence: N/A off the therapy currently  New medications: None reported.  Medication Access Issues: None reported.  Patient expressed understanding and was in agreement with this plan. She also understands that She can call clinic at any time with any questions, concerns, or complaints.   Follow-up plan: RTC 4 weeks  Thank you for allowing me to participate in the care of this very pleasant patient.   Time Total: 15 minutes  Visit consisted of counseling and education on dealing with issues of symptom management in the setting of serious and potentially life-threatening illness.Greater than 50%  of this time was spent counseling and coordinating care related to the above assessment and plan.  Signed by: [name], PharmD, BCPS, Salley Slaughter, CPP Hematology/Oncology Clinical Pharmacist Practitioner [place]/DB/AP [facility] [phone]  05/17/2022 3:07 PM

## 2022-05-18 ENCOUNTER — Encounter: Payer: Self-pay | Admitting: Oncology

## 2022-05-28 ENCOUNTER — Other Ambulatory Visit: Payer: Self-pay

## 2022-05-28 DIAGNOSIS — C9110 Chronic lymphocytic leukemia of B-cell type not having achieved remission: Secondary | ICD-10-CM

## 2022-05-28 MED ORDER — LENALIDOMIDE 10 MG PO CAPS: 10.0000 mg | ORAL_CAPSULE | Freq: Every day | ORAL | 0 refills | Status: DC

## 2022-06-04 ENCOUNTER — Ambulatory Visit (INDEPENDENT_AMBULATORY_CARE_PROVIDER_SITE_OTHER): Payer: Medicare Other | Admitting: Dermatology

## 2022-06-04 ENCOUNTER — Encounter: Payer: Self-pay | Admitting: Dermatology

## 2022-06-04 DIAGNOSIS — S0086XA Insect bite (nonvenomous) of other part of head, initial encounter: Secondary | ICD-10-CM

## 2022-06-04 DIAGNOSIS — W57XXXA Bitten or stung by nonvenomous insect and other nonvenomous arthropods, initial encounter: Secondary | ICD-10-CM

## 2022-06-04 DIAGNOSIS — L7211 Pilar cyst: Secondary | ICD-10-CM | POA: Diagnosis not present

## 2022-06-04 DIAGNOSIS — D492 Neoplasm of unspecified behavior of bone, soft tissue, and skin: Secondary | ICD-10-CM

## 2022-06-04 MED ORDER — MUPIROCIN 2 % EX OINT: 1.0000 | TOPICAL_OINTMENT | Freq: Every day | CUTANEOUS | 1 refills | Status: DC

## 2022-06-04 NOTE — Patient Instructions (Signed)

## 2022-06-04 NOTE — Progress Notes (Signed)
   Follow-Up Visit   Subjective  Monique [name] is a 51 y.o. female who presents for the following: Cyst (Pilar cyst vs other of left post scalp -  Excise today) and bites? (1 day, forehead, itchy).  The following portions of the chart were reviewed this encounter and updated as appropriate:   Tobacco  Allergies  Meds  Problems  Med Hx  Surg Hx  Fam Hx     Review of Systems:  No other skin or systemic complaints except as noted in HPI or Assessment and Plan.  Objective  Well appearing patient in no apparent distress; mood and affect are within normal limits.  A focused examination was performed including scalp. Relevant physical exam findings are noted in the Assessment and Plan.  L post scalp Cystic pap 2.2cm  Head - Anterior (Face) Pink edematous paps   Assessment & Plan  Neoplasm of skin L post scalp  Skin excision  Lesion length (cm):  2.2 Lesion width (cm):  2.2 Margin per side (cm):  0 Total excision diameter (cm):  2.2 Informed consent: discussed and consent obtained   Timeout: patient name, date of birth, surgical site, and procedure verified   Procedure prep:  Patient was prepped and draped in usual sterile fashion Prep type:  Isopropyl alcohol and povidone-iodine Anesthesia: the lesion was anesthetized in a standard fashion   Anesthetic:  1% lidocaine w/ epinephrine 1-100,000 buffered w/ 8.4% NaHCO3 (4cc lido w/ epi, 3cc bupivicaine, Total of 7cc) Instrument used comment:  #15c blade Hemostasis achieved with: pressure   Hemostasis achieved with comment:  Electrocautery Outcome: patient tolerated procedure well with no complications   Post-procedure details: sterile dressing applied and wound care instructions given   Dressing type: bandage, pressure dressing and bacitracin (Mupirocin)    Skin repair Complexity:  Complex Final length (cm):  2.2 Reason for type of repair: reduce tension to allow closure, reduce the risk of dehiscence, infection, and  necrosis, reduce subcutaneous dead space and avoid a hematoma, allow closure of the large defect, preserve normal anatomy, preserve normal anatomical and functional relationships and enhance both functionality and cosmetic results   Undermining: area extensively undermined   Undermining comment:  Undermining Defect 2.2cm Subcutaneous layers (deep stitches):  Suture size:  4-0 Suture type: Vicryl (polyglactin 910)   Subcutaneous suture technique: Inverted Dermal. Fine/surface layer approximation (top stitches):  Suture size:  3-0 Suture type: nylon   Stitches: horizontal mattress and simple interrupted   Stitches comment:  1 horizontal mattress, 2 simple interrupted Suture removal (days):  7 Hemostasis achieved with: pressure Outcome: patient tolerated procedure well with no complications   Post-procedure details: sterile dressing applied and wound care instructions given   Dressing type: bandage, pressure dressing and bacitracin (Mupirocin)    mupirocin ointment (BACTROBAN) 2 % Apply 1 Application topically daily. Qd to excision site  Specimen 1 - Surgical pathology Differential Diagnosis: D48.5 Cyst vs other  Check Margins: No Cystic pap  Cyst vs other excised today Start Mupirocin oint qd to excision site  Bug bite without infection, initial encounter Head - Anterior (Face)  Benign, observe.     Return in about 1 week (around 06/11/2022) for suture removal.  I, Othelia Pulling, RMA, am acting as scribe for [name], MD . Documentation: I have reviewed the above documentation for accuracy and completeness, and I agree with the above.  [name], MD

## 2022-06-05 ENCOUNTER — Telehealth: Payer: Self-pay

## 2022-06-05 NOTE — Telephone Encounter (Signed)
Pt doing well after yesterday's surgery./sh 

## 2022-06-07 NOTE — Progress Notes (Signed)
[place]  [phone] [phone]  ID: Monique [name] OB: 10-25-1970  MR#: [id number]  NBZ#:[id number]  Patient Care Team: [name], MD as PCP - General (Family Medicine) Lloyd Huger, MD as Consulting Physician (Oncology) Lane Hacker, [facility] as Pharmacist (Pharmacist) Greg Cutter, LCSW as [place] Management (Licensed Clinical Social Worker) Gloris Ham, RN as Registered Nurse (Oncology) Borders, Kirt Boys, NP as Nurse Practitioner (Hospice and Palliative Medicine)  CHIEF COMPLAINT: CLL, iron deficiency.  INTERVAL HISTORY: Patient returns to clinic today for further evaluation and continuation of her monthly Rituxan infusion.  She is tolerating Revlimid well without significant side effects.  Her rash has resolved.  She currently feels well.  She has no neurologic complaints.  She denies any fevers or night sweats.  She has a good appetite and denies weight loss.  She has no chest pain, shortness of breath, cough, or hemoptysis.  She denies any nausea, vomiting, constipation, or diarrhea.  She has no melena or hematochezia.  She has no urinary complaints.  Patient offers no specific complaints today.  REVIEW OF SYSTEMS:   Review of Systems  Constitutional: Negative.  Negative for diaphoresis, fever, malaise/fatigue and weight loss.  HENT: Negative.  Negative for congestion.   Respiratory: Negative.  Negative for cough and shortness of breath.   Cardiovascular: Negative.  Negative for chest pain and leg swelling.  Gastrointestinal:  Negative for abdominal pain and constipation.  Genitourinary: Negative.  Negative for dysuria and frequency.  Musculoskeletal: Negative.  Negative for back pain, myalgias and neck pain.  Skin: Negative.  Negative for rash.  Neurological: Negative.  Negative for tingling, sensory change, focal weakness and weakness.  Psychiatric/Behavioral: Negative.  Negative for  depression and suicidal ideas. The patient is not nervous/anxious.     As per HPI. Otherwise, a complete review of systems is negative.  PAST MEDICAL HISTORY: Past Medical History:  Diagnosis Date   Anxiety    Bursitis    leg pain   Carpal tunnel syndrome    Cervical dysplasia    hx LEEP over 18 years ago.    Chronic lymphocytic leukemia ([place]) 2018   Dr Grayland Ormond.  (in lymph nodes)   COPD (chronic obstructive pulmonary disease) ([place])    COVID-19 2021   Depression    Eating disorder    GERD (gastroesophageal reflux disease)    History of self-harm    Insomnia    Obsession    Tobacco abuse    Vitamin B12 deficiency (non anemic)     PAST SURGICAL HISTORY: Past Surgical History:  Procedure Laterality Date   BREAST BIOPSY Right 01/05/2018   US guided biopsy of 2 areas and 1 lymph node, MIXED INFLAMMATION AND GIANT CELL REACTION   CERVICAL BIOPSY  W/ LOOP ELECTRODE EXCISION     COLONOSCOPY WITH PROPOFOL N/A 04/21/2019   Procedure: COLONOSCOPY WITH PROPOFOL;  Surgeon: Virgel Manifold, MD;  Location: [facility] ENDOSCOPY;  Service: Gastroenterology;  Laterality: N/A;   OTHER SURGICAL HISTORY     scar tissue removed from vocal cords   TUBAL LIGATION     VAGINAL HYSTERECTOMY N/A 01/22/2021   Procedure: HYSTERECTOMY VAGINAL; BILATERAL SALPINGECTOMY;  Surgeon: Rubie Maid, MD;  Location: [facility] ORS;  Service: Gynecology;  Laterality: N/A;   vocal cord surgery  2005    FAMILY HISTORY: Family History  Problem Relation Age of Onset   Depression Mother    Cancer Mother        thyroid  Alcohol abuse Father    Alcohol abuse Brother    Depression Brother    Bipolar disorder Brother    Suicidality Brother    ADD / ADHD Son    Breast cancer Neg Hx     ADVANCED DIRECTIVES (Y/N):  N  HEALTH MAINTENANCE: Social History   Tobacco Use   Smoking status: Every Day    Packs/day: 1.00    Years: 31.00    Total pack years: 31.00    Types: Cigarettes    Start date: 09/25/1986    Smokeless tobacco: Never  Vaping Use   Vaping Use: Former  Substance Use Topics   Alcohol use: Not Currently    Alcohol/week: 0.0 standard drinks of alcohol    Comment: rarely   Drug use: Yes    Frequency: 7.0 times per week    Types: Marijuana     Colonoscopy:  PAP:  Bone density:  Lipid panel:  No Known Allergies  Current Outpatient Medications  Medication Sig Dispense Refill   ARIPiprazole (ABILIFY) 5 MG tablet Take 1 tablet (5 mg total) by mouth daily. 90 tablet 1   Ascorbic Acid (VITAMIN C) 1000 MG tablet Take 1,000 mg by mouth daily as needed (immune health).     ASPIRIN 81 PO Take 81 mg by mouth daily.     atorvastatin (LIPITOR) 10 MG tablet Take 1 tablet (10 mg total) by mouth in the morning. 90 tablet 1   estrogens, conjugated, (PREMARIN) 0.625 MG tablet Take 1 tablet (0.625 mg total) by mouth daily. Take daily for 21 days then do not take for 7 days. 90 tablet 3   hydrocortisone cream 1 % APPLY TOPICALLY TWICE DAILY 30 g 0   lenalidomide (REVLIMID) 10 MG capsule Take 1 capsule (10 mg total) by mouth daily. 28 capsule 0   loratadine (CLARITIN) 10 MG tablet Take 1 tablet (10 mg total) by mouth every morning. 90 tablet 1   Magnesium 500 MG CAPS Take 500 mg by mouth in the morning.     meloxicam (MOBIC) 15 MG tablet Take 1 tablet (15 mg total) by mouth daily. 90 tablet 1   mupirocin ointment (BACTROBAN) 2 % Apply 1 Application topically daily. Qd to excision site 22 g 1   omeprazole (PRILOSEC) 20 MG capsule Take 1 capsule (20 mg total) by mouth every morning. 90 capsule 3   predniSONE (DELTASONE) 5 MG tablet Take 1 tablet (5 mg total) by mouth daily with breakfast. To be taken daily following the prednisone taper prescription. 30 tablet 2   pregabalin (LYRICA) 100 MG capsule Take 100 mg by mouth 3 (three) times daily.     Ruxolitinib Phosphate 1.5 % CREA Apply topically.     tiZANidine (ZANAFLEX) 2 MG tablet Take 1 tablet (2 mg total) by mouth at bedtime. 90 tablet 1    umeclidinium-vilanterol (ANORO ELLIPTA) 62.5-25 MCG/ACT AEPB Inhale 1 puff into the lungs daily. 60 each 5   vitamin B-12 (CYANOCOBALAMIN) 500 MCG tablet Take 250 mcg by mouth every morning.     Calcium Carbonate-Vit D-Min (GNP CALCIUM 1200) 1200-1000 MG-UNIT CHEW Chew 1,200 mg by mouth daily with breakfast. Take in combination with vitamin D and magnesium. (Patient taking differently: Chew 2 tablets by mouth in the morning, at noon, and at bedtime. Take in combination with vitamin D and magnesium.) 90 tablet 3   Crisaborole 2 % OINT Apply topically. (Patient not taking: Reported on 05/16/2022)     DULoxetine (CYMBALTA) 60 MG capsule Take 1 capsule (  60 mg total) by mouth daily. (Patient not taking: Reported on 06/04/2022) 90 capsule 1   ondansetron (ZOFRAN) 8 MG tablet Take by mouth every 8 (eight) hours as needed. (Patient not taking: Reported on 06/04/2022)     Oxymetazoline HCl (RHOFADE) 1 % CREA Qam to face (Patient not taking: Reported on 06/04/2022) 30 g 11   No current facility-administered medications for this visit.    OBJECTIVE: Vitals:   06/13/22 0903  BP: 102/64  Pulse: 78  Resp: 16  Temp: (!) 96.8 F (36 C)  SpO2: 100%      Body mass index is 27.88 kg/m.    ECOG FS:0 - Asymptomatic  General: Well-developed, well-nourished, no acute distress. Eyes: Pink conjunctiva, anicteric sclera. HEENT: Normocephalic, moist mucous membranes. Lungs: No audible wheezing or coughing. Heart: Regular rate and rhythm. Abdomen: Soft, nontender, no obvious distention. Musculoskeletal: No edema, cyanosis, or clubbing. Neuro: Alert, answering all questions appropriately. Cranial nerves grossly intact. Skin: No rashes or petechiae noted. Psych: Normal affect. Lymphatics: No palpable lymphadenopathy.  LAB RESULTS:  Lab Results  Component Value Date   NA 138 06/13/2022   K 4.2 06/13/2022   CL 108 06/13/2022   CO2 25 06/13/2022   GLUCOSE 103 (H) 06/13/2022   BUN 20 06/13/2022   CREATININE  1.00 06/13/2022   CALCIUM 8.3 (L) 06/13/2022   PROT 6.3 (L) 06/13/2022   ALBUMIN 3.7 06/13/2022   AST 20 06/13/2022   ALT 22 06/13/2022   ALKPHOS 58 06/13/2022   BILITOT 0.4 06/13/2022   GFRNONAA >60 06/13/2022   GFRAA 79 12/06/2020    Lab Results  Component Value Date   WBC 10.9 (H) 06/13/2022   NEUTROABS 2.9 06/13/2022   HGB 15.1 (H) 06/13/2022   HCT 44.0 06/13/2022   MCV 96.7 06/13/2022   PLT 138 (L) 06/13/2022     STUDIES: No results found.  ONCOLOGY HISTORY:  Patient completed cycle 3 of Rituxan plus Treanda on March 26, 2018, but was then noted to have progressive disease.  She was then initiated on 480 mg Imbruvica in November 2019, but had progression of disease with increasing white blood cell count as well as CT scan on Jan 22, 2022 revealing increased lymphadenopathy consistent with progression of disease.  Attempted patient on venetoclax, but she could not tolerate it secondary to persistent diarrhea and transaminitis.   ASSESSMENT: CLL,  iron deficiency.  PLAN:    1. CLL: Confirmed by peripheral blood flow cytometry.  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  Patient recently completed 4 weekly cycles of Rituxan and now has transition to treatment every 4 weeks.  Patient has been instructed to continue prednisone, Claritin, and famotidine along with her 10 mg Revlimid daily.  Proceed with cycle 7 of Rituxan today.  Return to clinic in 4 weeks for further evaluation and consideration of cycle 8.  Appreciate clinical pharmacy input.   2.  Leukocytosis: Improving.  Proceed with treatment as above. 3.  Trigeminal neuralgia: Resolved.  MRI of the brain on Jan 25, 2019 was unremarkable.   4.  Depression/anxiety: Chronic and unchanged.  Continue follow-up and treatment per primary care. 5.  Neuropathic pain/peripheral neuropathy: Chronic and unchanged.  Patient reports she is now on disability.   Continue current follow-up and treatment as per neurology. 6.  Iron deficiency: Patient now has mild polycythemia. 7.  Rituxan reaction: Patient has been given additional premedications for preventative measures.  She did not have a reaction during cycle 2 or 3. 8.  Thrombocytopenia: Mild, monitor.   9.  Rash: Resolved with prednisone, Claritin, and famotidine.  Continue Revlimid as above.  If patient remains on prednisone long-term, she will require bone mineral density.   Patient expressed understanding and was in agreement with this plan. She also understands that She can call clinic at any time with any questions, concerns, or complaints.    Lloyd Huger, MD   06/14/2022 10:38 AM

## 2022-06-11 ENCOUNTER — Ambulatory Visit (INDEPENDENT_AMBULATORY_CARE_PROVIDER_SITE_OTHER): Payer: Medicare Other | Admitting: Dermatology

## 2022-06-11 DIAGNOSIS — L7211 Pilar cyst: Secondary | ICD-10-CM

## 2022-06-11 DIAGNOSIS — Z4802 Encounter for removal of sutures: Secondary | ICD-10-CM

## 2022-06-11 NOTE — Progress Notes (Unsigned)
   Follow-Up Visit   Subjective  Monique [name] is a 51 y.o. 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  The following portions of the chart were reviewed this encounter and updated as appropriate:   Tobacco  Allergies  Meds  Problems  Med Hx  Surg Hx  Fam Hx     Review of Systems:  No other skin or systemic complaints except as noted in HPI or Assessment and Plan.  Objective  Well appearing patient in no apparent distress; mood and affect are within normal limits.  A focused examination was performed including scalp. Relevant physical exam findings are noted in the Assessment and Plan.  left scalp Well healed scar    Assessment & Plan  Pilar cyst left scalp  Biopsy proven pilar cyst   Encounter for Removal of Sutures - Incision site at the left scalp is clean, dry and intact - Wound cleansed, sutures removed, wound cleansed and steri strips applied.  - Discussed pathology results showing Pilar cyst  - Scars remodel for a full year. - Patient advised to call with any concerns or if they notice any new or changing lesions.    Return if symptoms worsen or fail to improve.  IMarye Round, CMA, am acting as scribe for [name], MD .  Documentation: I have reviewed the above documentation for accuracy and completeness, and I agree with the above.  [name], MD

## 2022-06-11 NOTE — Patient Instructions (Signed)
Due to recent changes in healthcare laws, you may see results of your pathology and/or laboratory studies on MyChart before the doctors have had a chance to review them. We understand that in some cases there may be results that are confusing or concerning to you. Please understand that not all results are received at the same time and often the doctors may need to interpret multiple results in order to provide you with the best plan of care or course of treatment. Therefore, we ask that you please give us 2 business days to thoroughly review all your results before contacting the office for clarification. Should we see a critical lab result, you will be contacted sooner.   If You Need Anything After Your Visit  If you have any questions or concerns for your doctor, please call our main line at [phone] and press option 4 to reach your doctor's medical assistant. If no one answers, please leave a voicemail as directed and we will return your call as soon as possible. Messages left after 4 pm will be answered the following business day.   You may also send us a message via MyChart. We typically respond to MyChart messages within 1-2 business days.  For prescription refills, please ask your pharmacy to contact our office. Our fax number is [phone].  If you have an urgent issue when the clinic is closed that cannot wait until the next business day, you can page your doctor at the number below.    Please note that while we do our best to be available for urgent issues outside of office hours, we are not available 24/7.   If you have an urgent issue and are unable to reach us, you may choose to seek medical care at your doctor's office, retail clinic, urgent care center, or emergency room.  If you have a medical emergency, please immediately call 911 or go to the emergency department.  Pager Numbers  - Dr. Kowalski: [phone]  - Dr. Moye: [phone]  - Dr. Stewart:  [phone]  In the event of inclement weather, please call our main line at [phone] for an update on the status of any delays or closures.  Dermatology Medication Tips: Please keep the boxes that topical medications come in in order to help keep track of the instructions about where and how to use these. Pharmacies typically print the medication instructions only on the boxes and not directly on the medication tubes.   If your medication is too expensive, please contact our office at [phone] option 4 or send us a message through MyChart.   We are unable to tell what your co-pay for medications will be in advance as this is different depending on your insurance coverage. However, we may be able to find a substitute medication at lower cost or fill out paperwork to get insurance to cover a needed medication.   If a prior authorization is required to get your medication covered by your insurance company, please allow us 1-2 business days to complete this process.  Drug prices often vary depending on where the prescription is filled and some pharmacies may offer cheaper prices.  The website www.goodrx.com contains coupons for medications through different pharmacies. The prices here do not account for what the cost may be with help from insurance (it may be cheaper with your insurance), but the website can give you the price if you did not use any insurance.  - You can print the associated coupon and take it with   your prescription to the pharmacy.  - You may also stop by our office during regular business hours and pick up a GoodRx coupon card.  - If you need your prescription sent electronically to a different pharmacy, notify our office through [facility] MyChart or by phone at [phone] option 4.     Si Usted Necesita Algo Despus de Su Visita  Tambin puede enviarnos un mensaje a travs de MyChart. Por lo general respondemos a los mensajes de MyChart en el transcurso de 1 a 2  das hbiles.  Para renovar recetas, por favor pida a su farmacia que se ponga en contacto con nuestra oficina. Nuestro nmero de fax es el [phone].  Si tiene un asunto urgente cuando la clnica est cerrada y que no puede esperar hasta el siguiente da hbil, puede llamar/localizar a su doctor(a) al nmero que aparece a continuacin.   Por favor, tenga en cuenta que aunque hacemos todo lo posible para estar disponibles para asuntos urgentes fuera del horario de oficina, no estamos disponibles las 24 horas del da, los 7 das de la semana.   Si tiene un problema urgente y no puede comunicarse con nosotros, puede optar por buscar atencin mdica  en el consultorio de su doctor(a), en una clnica privada, en un centro de atencin urgente o en una sala de emergencias.  Si tiene una emergencia mdica, por favor llame inmediatamente al 911 o vaya a la sala de emergencias.  Nmeros de bper  - Dr. Kowalski: [phone]  - Dra. Moye: [phone]  - Dra. Stewart: [phone]  En caso de inclemencias del tiempo, por favor llame a nuestra lnea principal al [phone] para una actualizacin sobre el estado de cualquier retraso o cierre.  Consejos para la medicacin en dermatologa: Por favor, guarde las cajas en las que vienen los medicamentos de uso tpico para ayudarle a seguir las instrucciones sobre dnde y cmo usarlos. Las farmacias generalmente imprimen las instrucciones del medicamento slo en las cajas y no directamente en los tubos del medicamento.   Si su medicamento es muy caro, por favor, pngase en contacto con nuestra oficina llamando al [phone] y presione la opcin 4 o envenos un mensaje a travs de MyChart.   No podemos decirle cul ser su copago por los medicamentos por adelantado ya que esto es diferente dependiendo de la cobertura de su seguro. Sin embargo, es posible que podamos encontrar un medicamento sustituto a menor costo o llenar un formulario para que el  seguro cubra el medicamento que se considera necesario.   Si se requiere una autorizacin previa para que su compaa de seguros cubra su medicamento, por favor permtanos de 1 a 2 das hbiles para completar este proceso.  Los precios de los medicamentos varan con frecuencia dependiendo del lugar de dnde se surte la receta y alguna farmacias pueden ofrecer precios ms baratos.  El sitio web www.goodrx.com tiene cupones para medicamentos de diferentes farmacias. Los precios aqu no tienen en cuenta lo que podra costar con la ayuda del seguro (puede ser ms barato con su seguro), pero el sitio web puede darle el precio si no utiliz ningn seguro.  - Puede imprimir el cupn correspondiente y llevarlo con su receta a la farmacia.  - Tambin puede pasar por nuestra oficina durante el horario de atencin regular y recoger una tarjeta de cupones de GoodRx.  - Si necesita que su receta se enve electrnicamente a una farmacia diferente, informe a nuestra oficina a travs de MyChart de [place]   o por telfono llamando al [phone] y presione la opcin 4.  

## 2022-06-12 ENCOUNTER — Encounter: Payer: Self-pay | Admitting: Dermatology

## 2022-06-12 ENCOUNTER — Other Ambulatory Visit (HOSPITAL_COMMUNITY): Payer: Self-pay

## 2022-06-12 MED FILL — Dexamethasone Sodium Phosphate Inj 100 MG/10ML: INTRAMUSCULAR | Qty: 2 | Status: AC

## 2022-06-12 NOTE — Progress Notes (Addendum)
[facility]  [phone] [phone]  Patient Care Team: [name], MD as PCP - General (Family Medicine) Lloyd Huger, MD as Consulting Physician (Oncology) Lane Hacker, [facility] as Pharmacist (Pharmacist) Greg Cutter, LCSW as [employer] (Licensed Clinical Social Worker) Gloris Ham, RN as Registered Nurse (Oncology) Borders, Kirt Boys, NP as Nurse Practitioner [facility] and Palliative Medicine)   Name of the patient: Monique [name]  [id number]  [birth year]   Date of visit: 06/13/2022  HPI: Patient is a 51 y.o. female with  progressive CLL, previously treated with ibrutinib. Patient was intolerant to venetoclax treatment initiation and did not want to retry initiating venetoclax. The plan is to now treat her with rituximab and lenalidomide. Patient started rituximab on 02/28/22 and lenalidomide 03/01/22.  On 04/03/22, patient presented to clinic with a rash. Her lenalidomide was held and she was treated with prednisone (1 week) and loratadine continuosly. Rash improved by 04/10/22 f/u appt and she was restarted on lenalidomide.  On 04/28/22, patient called to report her rash had worsened and she stopped the lenalidomide on 04/23/22. She was stated on another prednisone taper. She was to taper down to '5mg'$  daily, then resume her lenalidomide. She resumed her lenalidomide on 05/16/22.   Reason for Consult: Oral chemotherapy follow-up for lenalidomide therapy.   PAST MEDICAL HISTORY: Past Medical History:  Diagnosis Date   Anxiety    Bursitis    leg pain   Carpal tunnel syndrome    Cervical dysplasia    hx LEEP over 18 years ago.    Chronic lymphocytic leukemia ([place]) 2018   Dr Grayland Ormond.  (in lymph nodes)   COPD (chronic obstructive pulmonary disease) ([place])    COVID-19 2021   Depression    Eating disorder    GERD (gastroesophageal reflux disease)    History of self-harm     Insomnia    Obsession    Tobacco abuse    Vitamin B12 deficiency (non anemic)     HEMATOLOGY/ONCOLOGY HISTORY:  Oncology History Overview Note  Patient with recent CLL diagnosis by flow cytometry.  Previously not being treated in observation.   S/p 4 cycles of weekly Rituxan completing on 02/20/2017 scans from January 2018 revealed minimal progression.   Developed right breast mass in March 2019.  Had diagnostic mammogram performed on 12/26/2017 showed suspicious right breast mass.  Biopsy revealed granulomatous mastitis.  She was treated with doxycycline 100 mg twice daily for 7 days.   Developed worsening adenopathy.  CT chest/abdomen/pelvis and soft tissue neck from May 2019 revealed progressive bulky adenopathy in the chest, abdomen and pelvis compatible with worsening disease.   [name] recommended beginning treatment with Rituxan and Treanda.  Received first cycle last week.  S/p 6 cycles of Rituxan and Treanda.  Tolerating well.  Last dose given 03/25/18.   CLL (chronic lymphocytic leukemia) ([place])  11/24/2016 Initial Diagnosis   CLL (chronic lymphocytic leukemia) ([place])   01/21/2018 - 03/26/2018 Chemotherapy   The patient had palonosetron (ALOXI) injection 0.25 mg, 0.25 mg, Intravenous,  Once, 4 of 5 cycles Administration: 0.25 mg (01/28/2018), 0.25 mg (02/25/2018), 0.25 mg (03/25/2018) riTUXimab (RITUXAN) 700 mg in sodium chloride 0.9 % 250 mL (2.1875 mg/mL) infusion, 375 mg/m2 = 700 mg, Intravenous,  Once, 4 of 5 cycles Administration: 700 mg (01/28/2018) bendamustine (BENDEKA) 150 mg in sodium chloride 0.9 % 50 mL (2.6786 mg/mL) chemo infusion, 90 mg/m2 = 150 mg, Intravenous,  Once, 4 of  5 cycles Administration: 150 mg (01/28/2018), 150 mg (01/29/2018), 150 mg (02/25/2018), 150 mg (02/26/2018), 150 mg (03/25/2018), 150 mg (03/26/2018)  for chemotherapy treatment.    02/28/2022 - 04/18/2022 Chemotherapy   Patient is on Treatment Plan : NON-HODGKINS LYMPHOMA Rituximab q7d     02/28/2022 -   Chemotherapy   Patient is on Treatment Plan : NON-HODGKINS LYMPHOMA Rituximab q7d     03/12/2022 - 03/12/2022 Chemotherapy   Patient is on Treatment Plan : LYMPHOMA CLL/SLL Venetoclax ramp up / Venetoclax + Rituximab (375/500) q28d x 6 cycles / Venetoclax q28d x 18 cycles       ALLERGIES:  has No Known Allergies.  MEDICATIONS:  Current Outpatient Medications  Medication Sig Dispense Refill   ARIPiprazole (ABILIFY) 5 MG tablet Take 1 tablet (5 mg total) by mouth daily. 90 tablet 1   Ascorbic Acid (VITAMIN C) 1000 MG tablet Take 1,000 mg by mouth daily as needed (immune health).     ASPIRIN 81 PO Take 81 mg by mouth daily.     atorvastatin (LIPITOR) 10 MG tablet Take 1 tablet (10 mg total) by mouth in the morning. 90 tablet 1   Calcium Carbonate-Vit D-Min (GNP CALCIUM 1200) 1200-1000 MG-UNIT CHEW Chew 1,200 mg by mouth daily with breakfast. Take in combination with vitamin D and magnesium. (Patient taking differently: Chew 2 tablets by mouth in the morning, at noon, and at bedtime. Take in combination with vitamin D and magnesium.) 90 tablet 3   Crisaborole 2 % OINT Apply topically. (Patient not taking: Reported on 05/16/2022)     DULoxetine (CYMBALTA) 60 MG capsule Take 1 capsule (60 mg total) by mouth daily. (Patient not taking: Reported on 06/04/2022) 90 capsule 1   estrogens, conjugated, (PREMARIN) 0.625 MG tablet Take 1 tablet (0.625 mg total) by mouth daily. Take daily for 21 days then do not take for 7 days. 90 tablet 3   hydrocortisone cream 1 % APPLY TOPICALLY TWICE DAILY 30 g 0   lenalidomide (REVLIMID) 10 MG capsule Take 1 capsule (10 mg total) by mouth daily. 28 capsule 0   loratadine (CLARITIN) 10 MG tablet Take 1 tablet (10 mg total) by mouth every morning. 90 tablet 1   Magnesium 500 MG CAPS Take 500 mg by mouth in the morning.     meloxicam (MOBIC) 15 MG tablet Take 1 tablet (15 mg total) by mouth daily. 90 tablet 1   mupirocin ointment (BACTROBAN) 2 % Apply 1 Application  topically daily. Qd to excision site 22 g 1   omeprazole (PRILOSEC) 20 MG capsule Take 1 capsule (20 mg total) by mouth every morning. 90 capsule 3   ondansetron (ZOFRAN) 8 MG tablet Take by mouth every 8 (eight) hours as needed. (Patient not taking: Reported on 06/04/2022)     Oxymetazoline HCl (RHOFADE) 1 % CREA Qam to face (Patient not taking: Reported on 06/04/2022) 30 g 11   predniSONE (DELTASONE) 5 MG tablet Take 1 tablet (5 mg total) by mouth daily with breakfast. To be taken daily following the prednisone taper prescription. 30 tablet 2   pregabalin (LYRICA) 100 MG capsule Take 100 mg by mouth 3 (three) times daily.     Ruxolitinib Phosphate 1.5 % CREA Apply topically.     tiZANidine (ZANAFLEX) 2 MG tablet Take 1 tablet (2 mg total) by mouth at bedtime. 90 tablet 1   umeclidinium-vilanterol (ANORO ELLIPTA) 62.5-25 MCG/ACT AEPB Inhale 1 puff into the lungs daily. 60 each 5   vitamin B-12 (CYANOCOBALAMIN) 500 MCG  tablet Take 250 mcg by mouth every morning.     No current facility-administered medications for this visit.    VITAL SIGNS: LMP 08/17/2016  There were no vitals filed for this visit.  Estimated body mass index is 27.41 kg/m as calculated from the following:   Height as of 04/18/22: '5\' 7"'$  (1.702 m).   Weight as of 05/16/22: 79.4 kg (175 lb).  LABS: CBC:    Component Value Date/Time   WBC 12.7 (H) 05/16/2022 0847   HGB 15.1 (H) 05/16/2022 0847   HCT 45.2 05/16/2022 0847   PLT 182 05/16/2022 0847   MCV 99.6 05/16/2022 0847   NEUTROABS 3.6 05/16/2022 0847   LYMPHSABS 8.6 (H) 05/16/2022 0847   MONOABS 0.3 05/16/2022 0847   EOSABS 0.3 05/16/2022 0847   BASOSABS 0.0 05/16/2022 0847   Comprehensive Metabolic Panel:    Component Value Date/Time   NA 138 05/16/2022 0847   NA 138 05/13/2018 1413   K 4.3 05/16/2022 0847   CL 106 05/16/2022 0847   CO2 24 05/16/2022 0847   BUN 19 05/16/2022 0847   BUN 15 05/13/2018 1413   CREATININE 1.03 (H) 05/16/2022 0847   CREATININE  0.96 12/06/2020 1646   GLUCOSE 114 (H) 05/16/2022 0847   CALCIUM 8.7 (L) 05/16/2022 0847   AST 26 05/16/2022 0847   ALT 28 05/16/2022 0847   ALKPHOS 63 05/16/2022 0847   BILITOT 0.5 05/16/2022 0847   BILITOT 0.4 05/13/2018 1413   PROT 6.4 (L) 05/16/2022 0847   PROT 6.3 05/13/2018 1413   ALBUMIN 3.8 05/16/2022 0847   ALBUMIN 4.1 05/13/2018 1413     Present during today's visit: patient only  Assessment and Plan: CBC/CMP reviewed No report of rash since restarting her lenalidomide Continue lenalidomide '10mg'$  daily, prednisone '5mg'$  daily, famotidine, and loratadine   Oral Chemotherapy Side Effect/Intolerance:  Currently no reported lenalidomide related side effects  Oral Chemotherapy Adherence: No missed doses reported  New medications: None reported.  Medication Access Issues: None reported.  Patient expressed understanding and was in agreement with this plan. She also understands that She can call clinic at any time with any questions, concerns, or complaints.   Follow-up plan: RTC 4 weeks  Thank you for allowing me to participate in the care of this very pleasant patient.   Time Total: 15 minutes  Visit consisted of counseling and education on dealing with issues of symptom management in the setting of serious and potentially life-threatening illness.Greater than 50%  of this time was spent counseling and coordinating care related to the above assessment and plan.  Signed by: [name], PharmD, BCPS, Salley Slaughter, CPP Hematology/Oncology Clinical Pharmacist Practitioner [place]/DB/AP [facility] [phone]  06/12/2022 10:28 AM

## 2022-06-13 ENCOUNTER — Encounter: Payer: Self-pay | Admitting: Oncology

## 2022-06-13 ENCOUNTER — Inpatient Hospital Stay: Payer: Medicare Other | Admitting: Pharmacist

## 2022-06-13 ENCOUNTER — Inpatient Hospital Stay (HOSPITAL_BASED_OUTPATIENT_CLINIC_OR_DEPARTMENT_OTHER): Payer: Medicare Other | Admitting: Oncology

## 2022-06-13 ENCOUNTER — Inpatient Hospital Stay: Payer: Medicare Other

## 2022-06-13 ENCOUNTER — Inpatient Hospital Stay: Payer: Medicare Other | Attending: Oncology

## 2022-06-13 VITALS — BP 102/64 | HR 78 | Temp 96.8°F | Resp 16 | Ht 67.0 in | Wt 178.0 lb

## 2022-06-13 DIAGNOSIS — C9110 Chronic lymphocytic leukemia of B-cell type not having achieved remission: Secondary | ICD-10-CM | POA: Insufficient documentation

## 2022-06-13 DIAGNOSIS — Z79899 Other long term (current) drug therapy: Secondary | ICD-10-CM | POA: Diagnosis not present

## 2022-06-13 DIAGNOSIS — Z5112 Encounter for antineoplastic immunotherapy: Secondary | ICD-10-CM | POA: Diagnosis present

## 2022-06-13 LAB — CBC WITH DIFFERENTIAL/PLATELET
Abs Immature Granulocytes: 0 10*3/uL (ref 0.00–0.07)
Basophils Absolute: 0 10*3/uL (ref 0.0–0.1)
Basophils Relative: 0 %
Eosinophils Absolute: 0 10*3/uL (ref 0.0–0.5)
Eosinophils Relative: 0 %
HCT: 44 % (ref 36.0–46.0)
Hemoglobin: 15.1 g/dL — ABNORMAL HIGH (ref 12.0–15.0)
Lymphocytes Relative: 68 %
Lymphs Abs: 7.4 10*3/uL — ABNORMAL HIGH (ref 0.7–4.0)
MCH: 33.2 pg (ref 26.0–34.0)
MCHC: 34.3 g/dL (ref 30.0–36.0)
MCV: 96.7 fL (ref 80.0–100.0)
Monocytes Absolute: 0.5 10*3/uL (ref 0.1–1.0)
Monocytes Relative: 5 %
Neutro Abs: 2.9 10*3/uL (ref 1.7–7.7)
Neutrophils Relative %: 27 %
Platelets: 138 10*3/uL — ABNORMAL LOW (ref 150–400)
RBC: 4.55 MIL/uL (ref 3.87–5.11)
RDW: 12.3 % (ref 11.5–15.5)
Smear Review: NORMAL
WBC: 10.9 10*3/uL — ABNORMAL HIGH (ref 4.0–10.5)
nRBC: 0 % (ref 0.0–0.2)

## 2022-06-13 LAB — COMPREHENSIVE METABOLIC PANEL
ALT: 22 U/L (ref 0–44)
AST: 20 U/L (ref 15–41)
Albumin: 3.7 g/dL (ref 3.5–5.0)
Alkaline Phosphatase: 58 U/L (ref 38–126)
Anion gap: 5 (ref 5–15)
BUN: 20 mg/dL (ref 6–20)
CO2: 25 mmol/L (ref 22–32)
Calcium: 8.3 mg/dL — ABNORMAL LOW (ref 8.9–10.3)
Chloride: 108 mmol/L (ref 98–111)
Creatinine, Ser: 1 mg/dL (ref 0.44–1.00)
GFR, Estimated: >60 mL/min (ref >60)
Glucose, Bld: 103 mg/dL — ABNORMAL HIGH (ref 70–99)
Potassium: 4.2 mmol/L (ref 3.5–5.1)
Sodium: 138 mmol/L (ref 135–145)
Total Bilirubin: 0.4 mg/dL (ref 0.3–1.2)
Total Protein: 6.3 g/dL — ABNORMAL LOW (ref 6.5–8.1)

## 2022-06-13 MED ORDER — SODIUM CHLORIDE 0.9 % IV SOLN
20.0000 mg | Freq: Once | INTRAVENOUS | Status: AC
  Administered 2022-06-13: 20 mg via INTRAVENOUS
  Filled 2022-06-13: qty 20

## 2022-06-13 MED ORDER — SODIUM CHLORIDE 0.9 % IV SOLN
375.0000 mg/m2 | Freq: Once | INTRAVENOUS | Status: AC
  Administered 2022-06-13: 700 mg via INTRAVENOUS
  Filled 2022-06-13: qty 50

## 2022-06-13 MED ORDER — DIPHENHYDRAMINE HCL 25 MG PO CAPS
50.0000 mg | ORAL_CAPSULE | Freq: Once | ORAL | Status: AC
  Administered 2022-06-13: 50 mg via ORAL
  Filled 2022-06-13: qty 2

## 2022-06-13 MED ORDER — ACETAMINOPHEN 325 MG PO TABS
650.0000 mg | ORAL_TABLET | Freq: Once | ORAL | Status: AC
  Administered 2022-06-13: 650 mg via ORAL
  Filled 2022-06-13: qty 2

## 2022-06-13 MED ORDER — FAMOTIDINE IN NACL 20-0.9 MG/50ML-% IV SOLN
20.0000 mg | Freq: Once | INTRAVENOUS | Status: AC
  Administered 2022-06-13: 20 mg via INTRAVENOUS
  Filled 2022-06-13: qty 50

## 2022-06-13 MED ORDER — SODIUM CHLORIDE 0.9 % IV SOLN
Freq: Once | INTRAVENOUS | Status: AC
  Filled 2022-06-13: qty 250

## 2022-06-13 NOTE — Patient Instructions (Signed)
[facility] AT [place]  Discharge Instructions: Thank you for choosing [facility] to provide your oncology and hematology care.  If you have a lab appointment with the [facility], please go directly to the [facility] and check in at the registration area.  Wear comfortable clothing and clothing appropriate for easy access to any Portacath or PICC line.   We strive to give you quality time with your provider. You may need to reschedule your appointment if you arrive late (15 or more minutes).  Arriving late affects you and other patients whose appointments are after yours.  Also, if you miss three or more appointments without notifying the office, you may be dismissed from the clinic at the provider's discretion.      For prescription refill requests, have your pharmacy contact our office and allow 72 hours for refills to be completed.    Today you received the following chemotherapy and/or immunotherapy agents Rituxin      To help prevent nausea and vomiting after your treatment, we encourage you to take your nausea medication as directed.  BELOW ARE SYMPTOMS THAT SHOULD BE REPORTED IMMEDIATELY: *FEVER GREATER THAN 100.4 F (38 C) OR HIGHER *CHILLS OR SWEATING *NAUSEA AND VOMITING THAT IS NOT CONTROLLED WITH YOUR NAUSEA MEDICATION *UNUSUAL SHORTNESS OF BREATH *UNUSUAL BRUISING OR BLEEDING *URINARY PROBLEMS (pain or burning when urinating, or frequent urination) *BOWEL PROBLEMS (unusual diarrhea, constipation, pain near the anus) TENDERNESS IN MOUTH AND THROAT WITH OR WITHOUT PRESENCE OF ULCERS (sore throat, sores in mouth, or a toothache) UNUSUAL RASH, SWELLING OR PAIN  UNUSUAL VAGINAL DISCHARGE OR ITCHING   Items with * indicate a potential emergency and should be followed up as soon as possible or go to the Emergency Department if any problems should occur.  Please show the CHEMOTHERAPY ALERT CARD or IMMUNOTHERAPY ALERT CARD at check-in to the  Emergency Department and triage nurse.  Should you have questions after your visit or need to cancel or reschedule your appointment, please contact [facility] CANCER [place] AT [place]  [phone] and follow the prompts.  Office hours are 8:00 a.m. to 4:30 p.m. Monday - Friday. Please note that voicemails left after 4:00 p.m. may not be returned until the following business day.  We are closed weekends and major holidays. You have access to a nurse at all times for urgent questions. Please call the main number to the clinic [phone] and follow the prompts.  For any non-urgent questions, you may also contact your provider using MyChart. We now offer e-Visits for anyone 22 and older to request care online for non-urgent symptoms. For details visit [email or web address].   Also download the MyChart app! Go to the app store, search "MyChart", open the app, select [place], and log in with your MyChart username and password.  Masks are optional in the cancer centers. If you would like for your care team to wear a mask while they are taking care of you, please let them know. For doctor visits, patients may have with them one support person who is at least 53 years old. At this time, visitors are not allowed in the infusion area.

## 2022-06-14 ENCOUNTER — Encounter: Payer: Self-pay | Admitting: Oncology

## 2022-07-03 ENCOUNTER — Ambulatory Visit: Payer: Medicare Other | Admitting: Dermatology

## 2022-07-04 ENCOUNTER — Encounter: Payer: Self-pay | Admitting: Oncology

## 2022-07-04 ENCOUNTER — Telehealth: Payer: Self-pay | Admitting: *Deleted

## 2022-07-04 NOTE — Telephone Encounter (Signed)
Patient called reporting that when she wiped after urinating this morning, there was bright red blood on the TP She states she wiped a second time to be sure and there was another "spot " of bright red blood again. She denies any pain, burning , or cramping She states that she urinates a lot but attributes that top her increased intake of water. She reports that she has had a partial hysterectomy. Please advise

## 2022-07-04 NOTE — Telephone Encounter (Signed)
Call returned to patient an advised per Monique Medina, NP to reach out to Dr Marcelline Mates for evaluation of this bleeding episode. Patient agreed to call Dr Marcelline Mates

## 2022-07-04 NOTE — Telephone Encounter (Signed)
Per patient she has voided twice more and has not seen any more blood. She states that the blood earlier was from vaginal area

## 2022-07-04 NOTE — Telephone Encounter (Signed)
Ok. If bleeding is persistent (lasting longer than 3-4 days) or heavier then she should schedule an appointment.

## 2022-07-04 NOTE — Telephone Encounter (Signed)
Pt calling to inform Dr. Marcelline Mates that she had light spotting when wiping after urinating today. Only happened once. Wanted to make sure Dr. Marcelline Mates knew.

## 2022-07-05 NOTE — Telephone Encounter (Signed)
LM with pt. I will send her a message as well

## 2022-07-10 MED FILL — Dexamethasone Sodium Phosphate Inj 100 MG/10ML: INTRAMUSCULAR | Qty: 2 | Status: AC

## 2022-07-11 ENCOUNTER — Encounter: Payer: Self-pay | Admitting: Oncology

## 2022-07-11 ENCOUNTER — Inpatient Hospital Stay: Payer: Medicare Other | Attending: Oncology

## 2022-07-11 ENCOUNTER — Inpatient Hospital Stay: Payer: Medicare Other

## 2022-07-11 ENCOUNTER — Inpatient Hospital Stay (HOSPITAL_BASED_OUTPATIENT_CLINIC_OR_DEPARTMENT_OTHER): Payer: Medicare Other | Admitting: Oncology

## 2022-07-11 ENCOUNTER — Telehealth: Payer: Self-pay | Admitting: Oncology

## 2022-07-11 VITALS — BP 113/71 | HR 68 | Temp 95.9°F

## 2022-07-11 DIAGNOSIS — C9110 Chronic lymphocytic leukemia of B-cell type not having achieved remission: Secondary | ICD-10-CM | POA: Diagnosis not present

## 2022-07-11 DIAGNOSIS — Z79899 Other long term (current) drug therapy: Secondary | ICD-10-CM | POA: Insufficient documentation

## 2022-07-11 DIAGNOSIS — Z5112 Encounter for antineoplastic immunotherapy: Secondary | ICD-10-CM | POA: Insufficient documentation

## 2022-07-11 LAB — COMPREHENSIVE METABOLIC PANEL
ALT: 19 U/L (ref 0–44)
AST: 16 U/L (ref 15–41)
Albumin: 3.9 g/dL (ref 3.5–5.0)
Alkaline Phosphatase: 64 U/L (ref 38–126)
Anion gap: 6 (ref 5–15)
BUN: 20 mg/dL (ref 6–20)
CO2: 24 mmol/L (ref 22–32)
Calcium: 8.7 mg/dL — ABNORMAL LOW (ref 8.9–10.3)
Chloride: 108 mmol/L (ref 98–111)
Creatinine, Ser: 0.9 mg/dL (ref 0.44–1.00)
GFR, Estimated: >60 mL/min (ref >60)
Glucose, Bld: 121 mg/dL — ABNORMAL HIGH (ref 70–99)
Potassium: 3.9 mmol/L (ref 3.5–5.1)
Sodium: 138 mmol/L (ref 135–145)
Total Bilirubin: 0.6 mg/dL (ref 0.3–1.2)
Total Protein: 6.6 g/dL (ref 6.5–8.1)

## 2022-07-11 LAB — CBC WITH DIFFERENTIAL/PLATELET
Abs Immature Granulocytes: 0.01 10*3/uL (ref 0.00–0.07)
Basophils Absolute: 0.1 10*3/uL (ref 0.0–0.1)
Basophils Relative: 1 %
Eosinophils Absolute: 0.2 10*3/uL (ref 0.0–0.5)
Eosinophils Relative: 3 %
HCT: 43.5 % (ref 36.0–46.0)
Hemoglobin: 14.9 g/dL (ref 12.0–15.0)
Immature Granulocytes: 0 %
Lymphocytes Relative: 53 %
Lymphs Abs: 4.6 10*3/uL — ABNORMAL HIGH (ref 0.7–4.0)
MCH: 32.8 pg (ref 26.0–34.0)
MCHC: 34.3 g/dL (ref 30.0–36.0)
MCV: 95.8 fL (ref 80.0–100.0)
Monocytes Absolute: 1.1 10*3/uL — ABNORMAL HIGH (ref 0.1–1.0)
Monocytes Relative: 14 %
Neutro Abs: 2.4 10*3/uL (ref 1.7–7.7)
Neutrophils Relative %: 29 %
Platelets: 123 10*3/uL — ABNORMAL LOW (ref 150–400)
RBC: 4.54 MIL/uL (ref 3.87–5.11)
RDW: 12.9 % (ref 11.5–15.5)
WBC: 8.5 10*3/uL (ref 4.0–10.5)
nRBC: 0 % (ref 0.0–0.2)

## 2022-07-11 MED ORDER — ACETAMINOPHEN 325 MG PO TABS
650.0000 mg | ORAL_TABLET | Freq: Once | ORAL | Status: AC
  Administered 2022-07-11: 650 mg via ORAL
  Filled 2022-07-11: qty 2

## 2022-07-11 MED ORDER — SODIUM CHLORIDE 0.9 % IV SOLN
375.0000 mg/m2 | Freq: Once | INTRAVENOUS | Status: AC
  Administered 2022-07-11: 700 mg via INTRAVENOUS
  Filled 2022-07-11: qty 20

## 2022-07-11 MED ORDER — FAMOTIDINE IN NACL 20-0.9 MG/50ML-% IV SOLN
20.0000 mg | Freq: Once | INTRAVENOUS | Status: AC
  Administered 2022-07-11: 20 mg via INTRAVENOUS
  Filled 2022-07-11: qty 50

## 2022-07-11 MED ORDER — SODIUM CHLORIDE 0.9 % IV SOLN
Freq: Once | INTRAVENOUS | Status: AC
  Filled 2022-07-11: qty 250

## 2022-07-11 MED ORDER — SODIUM CHLORIDE 0.9 % IV SOLN
20.0000 mg | Freq: Once | INTRAVENOUS | Status: AC
  Administered 2022-07-11: 20 mg via INTRAVENOUS
  Filled 2022-07-11: qty 20

## 2022-07-11 MED ORDER — DIPHENHYDRAMINE HCL 25 MG PO CAPS
50.0000 mg | ORAL_CAPSULE | Freq: Once | ORAL | Status: AC
  Administered 2022-07-11: 50 mg via ORAL
  Filled 2022-07-11: qty 2

## 2022-07-11 NOTE — Telephone Encounter (Signed)
Message sent to radiology scheduling @ 9:07am for CT that was ordered on 10/26.

## 2022-07-11 NOTE — Progress Notes (Signed)
[place]  [phone] [phone]  ID: Monique [name] OB: [birth year]  MR#: [id number]  FGH#:[id number]  Patient Care Team: [name], MD as PCP - General (Family Medicine) Lloyd Huger, MD as Consulting Physician (Oncology) Lane Hacker, [facility] as Pharmacist (Pharmacist) Greg Cutter, LCSW as [place] Management (Licensed Clinical Social Worker) Gloris Ham, RN as Registered Nurse (Oncology) Borders, Kirt Boys, NP as Nurse Practitioner (Hospice and Palliative Medicine)  CHIEF COMPLAINT: CLL, iron deficiency.  INTERVAL HISTORY: Patient returns to clinic today for further evaluation and continuation of her monthly Rituxan.  She currently feels well and is asymptomatic.  She is tolerating Revlimid without significant side effects.  Her rash has now resolved.  She has no neurologic complaints.  She denies any fevers or night sweats.  She has a good appetite and denies weight loss.  She has no chest pain, shortness of breath, cough, or hemoptysis.  She denies any nausea, vomiting, constipation, or diarrhea.  She has no melena or hematochezia.  She has no urinary complaints.  Patient offers no specific complaints today.  REVIEW OF SYSTEMS:   Review of Systems  Constitutional: Negative.  Negative for diaphoresis, fever, malaise/fatigue and weight loss.  HENT: Negative.  Negative for congestion.   Respiratory: Negative.  Negative for cough and shortness of breath.   Cardiovascular: Negative.  Negative for chest pain and leg swelling.  Gastrointestinal:  Negative for abdominal pain and constipation.  Genitourinary: Negative.  Negative for dysuria and frequency.  Musculoskeletal: Negative.  Negative for back pain, myalgias and neck pain.  Skin: Negative.  Negative for rash.  Neurological: Negative.  Negative for tingling, sensory change, focal weakness and weakness.  Psychiatric/Behavioral: Negative.  Negative  for depression and suicidal ideas. The patient is not nervous/anxious.     As per HPI. Otherwise, a complete review of systems is negative.  PAST MEDICAL HISTORY: Past Medical History:  Diagnosis Date   Anxiety    Bursitis    leg pain   Carpal tunnel syndrome    Cervical dysplasia    hx LEEP over 18 years ago.    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  (in lymph nodes)   COPD (chronic obstructive pulmonary disease) ([place])    COVID-19 2021   Depression    Eating disorder    GERD (gastroesophageal reflux disease)    History of self-harm    Insomnia    Obsession    Tobacco abuse    Vitamin B12 deficiency (non anemic)     PAST SURGICAL HISTORY: Past Surgical History:  Procedure Laterality Date   BREAST BIOPSY Right 01/05/2018   US guided biopsy of 2 areas and 1 lymph node, MIXED INFLAMMATION AND GIANT CELL REACTION   CERVICAL BIOPSY  W/ LOOP ELECTRODE EXCISION     COLONOSCOPY WITH PROPOFOL N/A 04/21/2019   Procedure: COLONOSCOPY WITH PROPOFOL;  Surgeon: Virgel Manifold, MD;  Location: [facility] ENDOSCOPY;  Service: Gastroenterology;  Laterality: N/A;   OTHER SURGICAL HISTORY     scar tissue removed from vocal cords   TUBAL LIGATION     VAGINAL HYSTERECTOMY N/A 01/22/2021   Procedure: HYSTERECTOMY VAGINAL; BILATERAL SALPINGECTOMY;  Surgeon: Rubie Maid, MD;  Location: [facility] ORS;  Service: Gynecology;  Laterality: N/A;   vocal cord surgery  2005    FAMILY HISTORY: Family History  Problem Relation Age of Onset   Depression Mother    Cancer Mother  thyroid   Alcohol abuse Father    Alcohol abuse Brother    Depression Brother    Bipolar disorder Brother    Suicidality Brother    ADD / ADHD Son    Breast cancer Neg Hx     ADVANCED DIRECTIVES (Y/N):  N  HEALTH MAINTENANCE: Social History   Tobacco Use   Smoking status: Every Day    Packs/day: 1.00    Years: 31.00    Total pack years: 31.00    Types: Cigarettes    Start date: 09/25/1986    Smokeless tobacco: Never  Vaping Use   Vaping Use: Former  Substance Use Topics   Alcohol use: Not Currently    Alcohol/week: 0.0 standard drinks of alcohol    Comment: rarely   Drug use: Yes    Frequency: 7.0 times per week    Types: Marijuana     Colonoscopy:  PAP:  Bone density:  Lipid panel:  No Known Allergies  Current Outpatient Medications  Medication Sig Dispense Refill   ARIPiprazole (ABILIFY) 5 MG tablet Take 1 tablet (5 mg total) by mouth daily. 90 tablet 1   Ascorbic Acid (VITAMIN C) 1000 MG tablet Take 1,000 mg by mouth daily as needed (immune health).     ASPIRIN 81 PO Take 81 mg by mouth daily.     atorvastatin (LIPITOR) 10 MG tablet Take 1 tablet (10 mg total) by mouth in the morning. 90 tablet 1   estrogens, conjugated, (PREMARIN) 0.625 MG tablet Take 1 tablet (0.625 mg total) by mouth daily. Take daily for 21 days then do not take for 7 days. 90 tablet 3   hydrocortisone cream 1 % APPLY TOPICALLY TWICE DAILY 30 g 0   lenalidomide (REVLIMID) 10 MG capsule Take 1 capsule (10 mg total) by mouth daily. 28 capsule 0   loratadine (CLARITIN) 10 MG tablet Take 1 tablet (10 mg total) by mouth every morning. 90 tablet 1   Magnesium 500 MG CAPS Take 500 mg by mouth in the morning.     meloxicam (MOBIC) 15 MG tablet Take 1 tablet (15 mg total) by mouth daily. 90 tablet 1   mupirocin ointment (BACTROBAN) 2 % Apply 1 Application topically daily. Qd to excision site 22 g 1   omeprazole (PRILOSEC) 20 MG capsule Take 1 capsule (20 mg total) by mouth every morning. 90 capsule 3   predniSONE (DELTASONE) 5 MG tablet Take 1 tablet (5 mg total) by mouth daily with breakfast. To be taken daily following the prednisone taper prescription. 30 tablet 2   pregabalin (LYRICA) 100 MG capsule Take 100 mg by mouth 3 (three) times daily.     Ruxolitinib Phosphate 1.5 % CREA Apply topically.     tiZANidine (ZANAFLEX) 2 MG tablet Take 1 tablet (2 mg total) by mouth at bedtime. 90 tablet 1    umeclidinium-vilanterol (ANORO ELLIPTA) 62.5-25 MCG/ACT AEPB Inhale 1 puff into the lungs daily. 60 each 5   vitamin B-12 (CYANOCOBALAMIN) 500 MCG tablet Take 250 mcg by mouth every morning.     Calcium Carbonate-Vit D-Min (GNP CALCIUM 1200) 1200-1000 MG-UNIT CHEW Chew 1,200 mg by mouth daily with breakfast. Take in combination with vitamin D and magnesium. (Patient taking differently: Chew 2 tablets by mouth in the morning, at noon, and at bedtime. Take in combination with vitamin D and magnesium.) 90 tablet 3   Crisaborole 2 % OINT Apply topically. (Patient not taking: Reported on 05/16/2022)     DULoxetine (CYMBALTA) 60 MG capsule  Take 1 capsule (60 mg total) by mouth daily. (Patient not taking: Reported on 06/04/2022) 90 capsule 1   ondansetron (ZOFRAN) 8 MG tablet Take by mouth every 8 (eight) hours as needed. (Patient not taking: Reported on 06/04/2022)     Oxymetazoline HCl (RHOFADE) 1 % CREA Qam to face (Patient not taking: Reported on 06/04/2022) 30 g 11   No current facility-administered medications for this visit.   Facility-Administered Medications Ordered in Other Visits  Medication Dose Route Frequency Provider Last Rate Last Admin   dexamethasone (DECADRON) 20 mg in sodium chloride 0.9 % 50 mL IVPB  20 mg Intravenous Once Lloyd Huger, MD       famotidine (PEPCID) IVPB 20 mg premix  20 mg Intravenous Once Lloyd Huger, MD       riTUXimab-pvvr (RUXIENCE) 700 mg in sodium chloride 0.9 % 250 mL (2.1875 mg/mL) infusion  375 mg/m2 (Treatment Plan Recorded) Intravenous Once Lloyd Huger, MD        OBJECTIVE: Vitals:   07/11/22 0846  BP: (!) 108/54  Pulse: 80  Temp: (!) 96.3 F (35.7 C)      Body mass index is 27.93 kg/m.    ECOG FS:0 - Asymptomatic  General: Well-developed, well-nourished, no acute distress. Eyes: Pink conjunctiva, anicteric sclera. HEENT: Normocephalic, moist mucous membranes. Lungs: No audible wheezing or coughing. Heart: Regular rate and  rhythm. Abdomen: Soft, nontender, no obvious distention. Musculoskeletal: No edema, cyanosis, or clubbing. Neuro: Alert, answering all questions appropriately. Cranial nerves grossly intact. Skin: No rashes or petechiae noted. Psych: Normal affect. Lymphatics: No palpable lymphadenopathy.  LAB RESULTS:  Lab Results  Component Value Date   NA 138 07/11/2022   K 3.9 07/11/2022   CL 108 07/11/2022   CO2 24 07/11/2022   GLUCOSE 121 (H) 07/11/2022   BUN 20 07/11/2022   CREATININE 0.90 07/11/2022   CALCIUM 8.7 (L) 07/11/2022   PROT 6.6 07/11/2022   ALBUMIN 3.9 07/11/2022   AST 16 07/11/2022   ALT 19 07/11/2022   ALKPHOS 64 07/11/2022   BILITOT 0.6 07/11/2022   GFRNONAA >60 07/11/2022   GFRAA 79 12/06/2020    Lab Results  Component Value Date   WBC 8.5 07/11/2022   NEUTROABS 2.4 07/11/2022   HGB 14.9 07/11/2022   HCT 43.5 07/11/2022   MCV 95.8 07/11/2022   PLT 123 (L) 07/11/2022     STUDIES: No results found.  ONCOLOGY HISTORY:  Patient completed cycle 3 of Rituxan plus Treanda on March 26, 2018, but was then noted to have progressive disease.  She was then initiated on 480 mg Imbruvica in November 2019, but had progression of disease with increasing white blood cell count as well as CT scan on Jan 22, 2022 revealing increased lymphadenopathy consistent with progression of disease.  Attempted patient on venetoclax, but she could not tolerate it secondary to persistent diarrhea and transaminitis.   ASSESSMENT: CLL,  iron deficiency.  PLAN:    1. CLL: Confirmed by peripheral blood flow cytometry.  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  Patient recently completed 4 weekly cycles of Rituxan and now has transition to treatment every 4 weeks.  Patient has been instructed to continue prednisone, Claritin, and famotidine along with her 10 mg Revlimid daily.  Proceed with cycle 8 of treatment today.   Return to clinic in 5 weeks after the Thanksgiving holiday for further evaluation and consideration of cycle  9.  Will reimage with CT scans prior to next treatment.  Appreciate clinical pharmacy input.   2.  Leukocytosis: Resolved. 3.  Trigeminal neuralgia: Resolved.  MRI of the brain on Jan 25, 2019 was unremarkable.   4.  Depression/anxiety:.  Continue follow-up and treatment per primary care. 5.  Neuropathic pain/peripheral neuropathy: Does not complain of this today.  Patient reports she is now on disability.  Continue current follow-up and treatment as per neurology. 6.  Iron deficiency: ALT. 7.  Rituxan reaction: Patient has been given additional premedications for preventative measures.  She did not have a reaction during cycle 2 or 3. 8.  Thrombocytopenia: Mild, monitor.   9.  Rash: Resolved with prednisone, Claritin, and famotidine.  Continue Revlimid as above.  If patient remains on prednisone long-term, she will require bone mineral density. 10.  Thrombocytopenia: Mild, monitor.   Patient expressed understanding and was in agreement with this plan. She also understands that She can call clinic at any time with any questions, concerns, or complaints.    Lloyd Huger, MD   07/11/2022 9:29 AM

## 2022-07-11 NOTE — Patient Instructions (Signed)
[facility]CER CTR AT [place]  Discharge Instructions: Thank you for choosing [facility] to provide your oncology and hematology care.  If you have a lab appointment with the [facility], please go directly to the [place] and check in at the registration area.  Wear comfortable clothing and clothing appropriate for easy access to any Portacath or PICC line.   We strive to give you quality time with your provider. You may need to reschedule your appointment if you arrive late (15 or more minutes).  Arriving late affects you and other patients whose appointments are after yours.  Also, if you miss three or more appointments without notifying the office, you may be dismissed from the clinic at the provider's discretion.      For prescription refill requests, have your pharmacy contact our office and allow 72 hours for refills to be completed.    Today you received the following chemotherapy and/or immunotherapy agents: Ruxience      To help prevent nausea and vomiting after your treatment, we encourage you to take your nausea medication as directed.  BELOW ARE SYMPTOMS THAT SHOULD BE REPORTED IMMEDIATELY: *FEVER GREATER THAN 100.4 F (38 C) OR HIGHER *CHILLS OR SWEATING *NAUSEA AND VOMITING THAT IS NOT CONTROLLED WITH YOUR NAUSEA MEDICATION *UNUSUAL SHORTNESS OF BREATH *UNUSUAL BRUISING OR BLEEDING *URINARY PROBLEMS (pain or burning when urinating, or frequent urination) *BOWEL PROBLEMS (unusual diarrhea, constipation, pain near the anus) TENDERNESS IN MOUTH AND THROAT WITH OR WITHOUT PRESENCE OF ULCERS (sore throat, sores in mouth, or a toothache) UNUSUAL RASH, SWELLING OR PAIN  UNUSUAL VAGINAL DISCHARGE OR ITCHING   Items with * indicate a potential emergency and should be followed up as soon as possible or go to the Emergency Department if any problems should occur.  Please show the CHEMOTHERAPY ALERT CARD or IMMUNOTHERAPY ALERT CARD at check-in to  the Emergency Department and triage nurse.  Should you have questions after your visit or need to cancel or reschedule your appointment, please contact [facility] CANCER [place] AT [place]  [phone] and follow the prompts.  Office hours are 8:00 a.m. to 4:30 p.m. Monday - Friday. Please note that voicemails left after 4:00 p.m. may not be returned until the following business day.  We are closed weekends and major holidays. You have access to a nurse at all times for urgent questions. Please call the main number to the clinic [phone] and follow the prompts.  For any non-urgent questions, you may also contact your provider using MyChart. We now offer e-Visits for anyone 62 and older to request care online for non-urgent symptoms. For details visit [email or web address].   Also download the MyChart app! Go to the app store, search "MyChart", open the app, select [facility], and log in with your MyChart username and password.  Masks are optional in the cancer centers. If you would like for your care team to wear a mask while they are taking care of you, please let them know. For doctor visits, patients may have with them one support person who is at least 54 years old. At this time, visitors are not allowed in the infusion area.

## 2022-07-15 ENCOUNTER — Encounter (INDEPENDENT_AMBULATORY_CARE_PROVIDER_SITE_OTHER): Payer: Self-pay

## 2022-07-16 ENCOUNTER — Other Ambulatory Visit: Payer: Self-pay

## 2022-07-16 DIAGNOSIS — C9110 Chronic lymphocytic leukemia of B-cell type not having achieved remission: Secondary | ICD-10-CM

## 2022-07-16 MED ORDER — LENALIDOMIDE 10 MG PO CAPS: 10.0000 mg | ORAL_CAPSULE | Freq: Every day | ORAL | 0 refills | Status: DC

## 2022-07-25 ENCOUNTER — Other Ambulatory Visit: Payer: Self-pay | Admitting: *Deleted

## 2022-07-25 DIAGNOSIS — C9110 Chronic lymphocytic leukemia of B-cell type not having achieved remission: Secondary | ICD-10-CM

## 2022-07-25 MED ORDER — PREDNISONE 5 MG PO TABS: 5.0000 mg | ORAL_TABLET | Freq: Every day | ORAL | 2 refills | Status: DC

## 2022-07-31 ENCOUNTER — Other Ambulatory Visit: Payer: Medicaid Other

## 2022-07-31 ENCOUNTER — Ambulatory Visit: Admission: RE | Admit: 2022-07-31 | Payer: Medicare Other | Source: Ambulatory Visit

## 2022-07-31 ENCOUNTER — Ambulatory Visit
Admission: RE | Admit: 2022-07-31 | Discharge: 2022-07-31 | Disposition: A | Discharge status: Home / Self Care | Payer: Medicare Other | Source: Ambulatory Visit | Attending: Oncology | Admitting: Oncology

## 2022-07-31 DIAGNOSIS — C9110 Chronic lymphocytic leukemia of B-cell type not having achieved remission: Secondary | ICD-10-CM | POA: Diagnosis present

## 2022-07-31 DIAGNOSIS — R161 Splenomegaly, not elsewhere classified: Secondary | ICD-10-CM | POA: Insufficient documentation

## 2022-07-31 DIAGNOSIS — J439 Emphysema, unspecified: Secondary | ICD-10-CM | POA: Insufficient documentation

## 2022-07-31 DIAGNOSIS — I700 Atherosclerosis of aorta: Secondary | ICD-10-CM | POA: Insufficient documentation

## 2022-07-31 MED ORDER — IOHEXOL 300 MG/ML  SOLN
100.0000 mL | Freq: Once | INTRAMUSCULAR | Status: AC | PRN
  Administered 2022-07-31: 100 mL via INTRAVENOUS

## 2022-08-01 ENCOUNTER — Other Ambulatory Visit: Payer: Self-pay

## 2022-08-01 DIAGNOSIS — C9110 Chronic lymphocytic leukemia of B-cell type not having achieved remission: Secondary | ICD-10-CM

## 2022-08-01 MED ORDER — PREDNISONE 5 MG PO TABS: 5.0000 mg | ORAL_TABLET | Freq: Every day | ORAL | 2 refills | Status: DC

## 2022-08-07 ENCOUNTER — Ambulatory Visit: Payer: Medicare Other

## 2022-08-07 ENCOUNTER — Other Ambulatory Visit: Payer: Medicare Other

## 2022-08-07 ENCOUNTER — Encounter: Payer: Self-pay | Admitting: Oncology

## 2022-08-07 ENCOUNTER — Telehealth: Payer: Self-pay | Admitting: *Deleted

## 2022-08-07 ENCOUNTER — Ambulatory Visit: Payer: Medicare Other | Admitting: Oncology

## 2022-08-07 NOTE — Telephone Encounter (Signed)
Pt apparently showed up at cancer center this morning asking about her CT results. Spoke with MD who states there was minimal changes in areas but not enough to be concerned or warrant a change in her treatment plans. I called pt and she immediately wanted to know why her appt changed for treatment.She said she came to clinic this am for her Tx but was told she did not have an appt.In reviewing this, pt's next Tx would have fallen on Thanksgiving day. Explained to patient all schedules have been altered due to Holiday. Pt stated her MD said appts should be made a day or 2 after CT scans to review. Again emphasized that is true for most times but not able around the [place]. If the results were bad MD would contact patient. She said the radiologist suggests further studies. I explained that Dr Grayland Ormond would address those concerns on her appt next week.

## 2022-08-12 ENCOUNTER — Ambulatory Visit: Payer: Self-pay

## 2022-08-12 NOTE — Telephone Encounter (Signed)
  Chief Complaint: cough Symptoms: productive cough, pain under R breast after coughing that's positional  Frequency: 2 weeks or more Pertinent Negatives: Patient denies SOB or fever Disposition: '[]'$ ED /'[]'$ Urgent Care (no appt availability in office) / '[x]'$ Appointment(In office/virtual)/ '[]'$  [place] [facility]/ '[]'$ Home Care/ '[]'$ Refused Recommended Disposition /'[]'$ [facility]/ '[]'$  Follow-up with PCP Additional Notes: pt states she has the pain when moving certain ways but mostly after coughing spell. Hasn't taken any medication for cough. Scheduled appt for tomorrow at 1320 as pt requested. Care advice given and pt verbalized understanding. No other questions/concerns noted.   Reason for Disposition  [1] Continuous (nonstop) coughing interferes with work or school AND [2] no improvement using cough treatment per Care Advice  Answer Assessment - Initial Assessment Questions 1. ONSET: "When did the cough begin?"      2 weeks  3. SPUTUM: "Describe the color of your sputum" (none, dry cough; clear, white, yellow, green)     Unsure  5. DIFFICULTY BREATHING: "Are you having difficulty breathing?" If Yes, ask: "How bad is it?" (e.g., mild, moderate, severe)    - MILD: No SOB at rest, mild SOB with walking, speaks normally in sentences, can lie down, no retractions, pulse < 100.    - MODERATE: SOB at rest, SOB with minimal exertion and prefers to sit, cannot lie down flat, speaks in phrases, mild retractions, audible wheezing, pulse 100-120.    - SEVERE: Very SOB at rest, speaks in single words, struggling to breathe, sitting hunched forward, retractions, pulse > 120      no 10. OTHER SYMPTOMS: "Do you have any other symptoms?" (e.g., runny nose, wheezing, chest pain)       Chest pain under R breast when moving certain ways after cough  Protocols used: Cough - Acute Productive-A-AH

## 2022-08-13 ENCOUNTER — Encounter: Payer: Self-pay | Admitting: Nurse Practitioner

## 2022-08-13 ENCOUNTER — Other Ambulatory Visit: Payer: Self-pay | Admitting: Pharmacist

## 2022-08-13 ENCOUNTER — Other Ambulatory Visit: Payer: Self-pay

## 2022-08-13 ENCOUNTER — Ambulatory Visit (INDEPENDENT_AMBULATORY_CARE_PROVIDER_SITE_OTHER): Payer: Medicare Other | Admitting: Nurse Practitioner

## 2022-08-13 VITALS — BP 130/70 | HR 90 | Temp 98.4°F | Resp 16 | Ht 67.0 in | Wt 177.6 lb

## 2022-08-13 DIAGNOSIS — R052 Subacute cough: Secondary | ICD-10-CM | POA: Diagnosis not present

## 2022-08-13 DIAGNOSIS — C9110 Chronic lymphocytic leukemia of B-cell type not having achieved remission: Secondary | ICD-10-CM

## 2022-08-13 DIAGNOSIS — J439 Emphysema, unspecified: Secondary | ICD-10-CM | POA: Diagnosis not present

## 2022-08-13 MED ORDER — PROMETHAZINE-DM 6.25-15 MG/5ML PO SYRP: 5.0000 mL | ORAL_SOLUTION | Freq: Four times a day (QID) | ORAL | 0 refills | Status: DC | PRN

## 2022-08-13 MED ORDER — ALBUTEROL SULFATE HFA 108 (90 BASE) MCG/ACT IN AERS: 2.0000 | INHALATION_SPRAY | Freq: Four times a day (QID) | RESPIRATORY_TRACT | 0 refills | Status: DC | PRN

## 2022-08-13 MED ORDER — LENALIDOMIDE 10 MG PO CAPS: 10.0000 mg | ORAL_CAPSULE | Freq: Every day | ORAL | 0 refills | Status: DC

## 2022-08-13 MED ORDER — BENZONATATE 100 MG PO CAPS: 100.0000 mg | ORAL_CAPSULE | Freq: Two times a day (BID) | ORAL | 0 refills | Status: DC | PRN

## 2022-08-13 MED ORDER — AZITHROMYCIN 250 MG PO TABS: ORAL_TABLET | ORAL | 0 refills | Status: AC

## 2022-08-13 NOTE — Progress Notes (Signed)
BP 130/70   Pulse 90   Temp 98.4 F (36.9 C) (Oral)   Resp 16   Ht '5\' 7"'$  (1.702 m)   Wt 177 lb 9.6 oz (80.6 kg)   LMP 08/17/2016   SpO2 96%   BMI 27.82 kg/m    Subjective:    Patient ID: Monique [name], female    DOB: [birth year], 51 y.o.   MRN: [id number]  HPI: Monique [name] is a 51 y.o. female  Chief Complaint  Patient presents with   Cough    Congestion for 3 weeks   Persistent cough/emphysema:  patient reports that she has had a cough for three weeks. She denies any fever or shortness of breath. She does say that sometimes when she coughs it makes her gag.  She says she is getting stitches in her side from coughing so much. She says that she has taken advair once a day and mucinex.  Patient is currently taking prednisone 5 mg daily.  She was switched from advair to [employer] but she never received it. Contacting pharmacy to find out why.  Pharmacy is working on it and will have available for her tomorrow.  Patient reports that she often gets bronchitis and is concerned that this is what is going on. She says she has had changes in her mucous. She does have emphysema.  Her last ct chest was on 07/31/2022 which showed  . Increased size of the lymph nodes above and below the diaphragm, consistent with disease progression. 2. New mild splenomegaly. 3. Slightly upper lobe predominant hazy ground-glass opacities with ill-defined tiny centrilobular nodules, favored to reflect smoking related respiratory bronchiolitis. 4. Stable small pulmonary nodules. 5. Aortic Atherosclerosis (ICD10-I70.0) and Emphysema (ICD10-J43.9).  Patient is already on prednisone. Will get patient started on anoro ellipta as previously prescribed.  Will also start her on albuterol inhaler, zpack, tessalon perls and promethazine-DM.    Relevant past medical, surgical, family and social history reviewed and updated as indicated. Interim medical history since our last visit reviewed. Allergies and  medications reviewed and updated.  Review of Systems  Constitutional: Negative for fever or weight change.  Respiratory: positive for cough and negative for  shortness of breath.   Cardiovascular: negative for chest pain or palpitations.  Gastrointestinal: Negative for abdominal pain, no bowel changes.  Musculoskeletal: Negative for gait problem or joint swelling.  Skin: Negative for rash.  Neurological: Negative for dizziness or headache.  No other specific complaints in a complete review of systems (except as listed in HPI above).      Objective:    BP 130/70   Pulse 90   Temp 98.4 F (36.9 C) (Oral)   Resp 16   Ht '5\' 7"'$  (1.702 m)   Wt 177 lb 9.6 oz (80.6 kg)   LMP 08/17/2016   SpO2 96%   BMI 27.82 kg/m   Wt Readings from Last 3 Encounters:  08/13/22 177 lb 9.6 oz (80.6 kg)  07/11/22 178 lb 4.8 oz (80.9 kg)  06/13/22 178 lb (80.7 kg)    Physical Exam  Constitutional: Patient appears well-developed and well-nourished.  No distress.  HEENT: head atraumatic, normocephalic, pupils equal and reactive to light, neck supple, throat within normal limits Cardiovascular: Normal rate, regular rhythm and normal heart sounds.  No murmur heard. No BLE edema. Pulmonary/Chest: Effort normal and breath sounds wheezing. No respiratory distress. Abdominal: Soft.  There is no tenderness. Psychiatric: Patient has a normal mood and affect. behavior is normal. Judgment  and thought content normal.   Results for orders placed or performed in visit on 07/11/22  Comprehensive metabolic panel  Result Value Ref Range   Sodium 138 135 - 145 mmol/L   Potassium 3.9 3.5 - 5.1 mmol/L   Chloride 108 98 - 111 mmol/L   CO2 24 22 - 32 mmol/L   Glucose, Bld 121 (H) 70 - 99 mg/dL   BUN 20 6 - 20 mg/dL   Creatinine, Ser 0.90 0.44 - 1.00 mg/dL   Calcium 8.7 (L) 8.9 - 10.3 mg/dL   Total Protein 6.6 6.5 - 8.1 g/dL   Albumin 3.9 3.5 - 5.0 g/dL   AST 16 15 - 41 U/L   ALT 19 0 - 44 U/L   Alkaline  Phosphatase 64 38 - 126 U/L   Total Bilirubin 0.6 0.3 - 1.2 mg/dL   GFR, Estimated >60 >60 mL/min   Anion gap 6 5 - 15  CBC with Differential/Platelet  Result Value Ref Range   WBC 8.5 4.0 - 10.5 K/uL   RBC 4.54 3.87 - 5.11 MIL/uL   Hemoglobin 14.9 12.0 - 15.0 g/dL   HCT 43.5 36.0 - 46.0 %   MCV 95.8 80.0 - 100.0 fL   MCH 32.8 26.0 - 34.0 pg   MCHC 34.3 30.0 - 36.0 g/dL   RDW 12.9 11.5 - 15.5 %   Platelets 123 (L) 150 - 400 K/uL   nRBC 0.0 0.0 - 0.2 %   Neutrophils Relative % 29 %   Neutro Abs 2.4 1.7 - 7.7 K/uL   Lymphocytes Relative 53 %   Lymphs Abs 4.6 (H) 0.7 - 4.0 K/uL   Monocytes Relative 14 %   Monocytes Absolute 1.1 (H) 0.1 - 1.0 K/uL   Eosinophils Relative 3 %   Eosinophils Absolute 0.2 0.0 - 0.5 K/uL   Basophils Relative 1 %   Basophils Absolute 0.1 0.0 - 0.1 K/uL   Immature Granulocytes 0 %   Abs Immature Granulocytes 0.01 0.00 - 0.07 K/uL      Assessment & Plan:   Problem List Items Addressed This Visit       Respiratory   Emphysema of lung (HCC)    start taking tessalon perls, phenergan-DM, z-pack, albuterol inhaler, anoro ellipta. stop advair, continue prednisone       Relevant Medications   albuterol (VENTOLIN HFA) 108 (90 Base) MCG/ACT inhaler   azithromycin (ZITHROMAX) 250 MG tablet   promethazine-dextromethorphan (PROMETHAZINE-DM) 6.25-15 MG/5ML syrup   benzonatate (TESSALON) 100 MG capsule   Other Visit Diagnoses     Subacute cough    -  Primary   start taking tessalon perls, phenergan-DM, z-pack, albuterol inhaler, anoro ellipta. stop advair, continue prednisone   Relevant Medications   albuterol (VENTOLIN HFA) 108 (90 Base) MCG/ACT inhaler   azithromycin (ZITHROMAX) 250 MG tablet   promethazine-dextromethorphan (PROMETHAZINE-DM) 6.25-15 MG/5ML syrup   benzonatate (TESSALON) 100 MG capsule        Follow up plan: Return if symptoms worsen or fail to improve.

## 2022-08-13 NOTE — Assessment & Plan Note (Signed)
start taking tessalon perls, phenergan-DM, z-pack, albuterol inhaler, anoro ellipta. stop advair, continue prednisone

## 2022-08-15 ENCOUNTER — Inpatient Hospital Stay: Payer: Medicare Other

## 2022-08-15 ENCOUNTER — Inpatient Hospital Stay: Payer: Medicare Other | Attending: Oncology | Admitting: Oncology

## 2022-08-15 ENCOUNTER — Encounter: Payer: Self-pay | Admitting: Oncology

## 2022-08-15 VITALS — BP 104/69 | HR 58 | Temp 97.9°F | Resp 15

## 2022-08-15 DIAGNOSIS — Z5112 Encounter for antineoplastic immunotherapy: Secondary | ICD-10-CM | POA: Diagnosis not present

## 2022-08-15 DIAGNOSIS — C9110 Chronic lymphocytic leukemia of B-cell type not having achieved remission: Secondary | ICD-10-CM

## 2022-08-15 DIAGNOSIS — Z79899 Other long term (current) drug therapy: Secondary | ICD-10-CM | POA: Insufficient documentation

## 2022-08-15 LAB — COMPREHENSIVE METABOLIC PANEL
ALT: 21 U/L (ref 0–44)
AST: 20 U/L (ref 15–41)
Albumin: 3.7 g/dL (ref 3.5–5.0)
Alkaline Phosphatase: 61 U/L (ref 38–126)
Anion gap: 6 (ref 5–15)
BUN: 19 mg/dL (ref 6–20)
CO2: 25 mmol/L (ref 22–32)
Calcium: 8.4 mg/dL — ABNORMAL LOW (ref 8.9–10.3)
Chloride: 107 mmol/L (ref 98–111)
Creatinine, Ser: 1.11 mg/dL — ABNORMAL HIGH (ref 0.44–1.00)
GFR, Estimated: 59 mL/min — ABNORMAL LOW (ref >60)
Glucose, Bld: 97 mg/dL (ref 70–99)
Potassium: 4.1 mmol/L (ref 3.5–5.1)
Sodium: 138 mmol/L (ref 135–145)
Total Bilirubin: 0.6 mg/dL (ref 0.3–1.2)
Total Protein: 6.4 g/dL — ABNORMAL LOW (ref 6.5–8.1)

## 2022-08-15 LAB — CBC WITH DIFFERENTIAL/PLATELET
Abs Immature Granulocytes: 0.04 10*3/uL (ref 0.00–0.07)
Basophils Absolute: 0.1 10*3/uL (ref 0.0–0.1)
Basophils Relative: 1 %
Eosinophils Absolute: 0.2 10*3/uL (ref 0.0–0.5)
Eosinophils Relative: 3 %
HCT: 42.1 % (ref 36.0–46.0)
Hemoglobin: 14.5 g/dL (ref 12.0–15.0)
Immature Granulocytes: 1 %
Lymphocytes Relative: 54 %
Lymphs Abs: 3.8 10*3/uL (ref 0.7–4.0)
MCH: 33.8 pg (ref 26.0–34.0)
MCHC: 34.4 g/dL (ref 30.0–36.0)
MCV: 98.1 fL (ref 80.0–100.0)
Monocytes Absolute: 1.1 10*3/uL — ABNORMAL HIGH (ref 0.1–1.0)
Monocytes Relative: 15 %
Neutro Abs: 1.8 10*3/uL (ref 1.7–7.7)
Neutrophils Relative %: 26 %
Platelets: 129 10*3/uL — ABNORMAL LOW (ref 150–400)
RBC: 4.29 MIL/uL (ref 3.87–5.11)
RDW: 14.9 % (ref 11.5–15.5)
WBC: 7 10*3/uL (ref 4.0–10.5)
nRBC: 0 % (ref 0.0–0.2)

## 2022-08-15 MED ORDER — SODIUM CHLORIDE 0.9 % IV SOLN
375.0000 mg/m2 | Freq: Once | INTRAVENOUS | Status: AC
  Administered 2022-08-15: 700 mg via INTRAVENOUS
  Filled 2022-08-15: qty 50

## 2022-08-15 MED ORDER — DIPHENHYDRAMINE HCL 25 MG PO CAPS
50.0000 mg | ORAL_CAPSULE | Freq: Once | ORAL | Status: AC
  Administered 2022-08-15: 50 mg via ORAL
  Filled 2022-08-15: qty 2

## 2022-08-15 MED ORDER — SODIUM CHLORIDE 0.9 % IV SOLN
20.0000 mg | Freq: Once | INTRAVENOUS | Status: AC
  Administered 2022-08-15: 20 mg via INTRAVENOUS
  Filled 2022-08-15: qty 20

## 2022-08-15 MED ORDER — FAMOTIDINE IN NACL 20-0.9 MG/50ML-% IV SOLN
20.0000 mg | Freq: Once | INTRAVENOUS | Status: AC
  Administered 2022-08-15: 20 mg via INTRAVENOUS
  Filled 2022-08-15: qty 50

## 2022-08-15 MED ORDER — SODIUM CHLORIDE 0.9 % IV SOLN
Freq: Once | INTRAVENOUS | Status: AC
  Filled 2022-08-15: qty 250

## 2022-08-15 MED ORDER — ACETAMINOPHEN 325 MG PO TABS
650.0000 mg | ORAL_TABLET | Freq: Once | ORAL | Status: AC
  Administered 2022-08-15: 650 mg via ORAL
  Filled 2022-08-15: qty 2

## 2022-08-15 NOTE — Patient Instructions (Signed)
[facility] CANCER CTR AT [place]  Discharge Instructions: Thank you for choosing [facility] to provide your oncology and hematology care.  If you have a lab appointment with the [facility], please go directly to the [facility] and check in at the registration area.  Wear comfortable clothing and clothing appropriate for easy access to any Portacath or PICC line.   We strive to give you quality time with your provider. You may need to reschedule your appointment if you arrive late (15 or more minutes).  Arriving late affects you and other patients whose appointments are after yours.  Also, if you miss three or more appointments without notifying the office, you may be dismissed from the clinic at the provider's discretion.      For prescription refill requests, have your pharmacy contact our office and allow 72 hours for refills to be completed.    Today you received the following chemotherapy and/or immunotherapy agents- rituximab      To help prevent nausea and vomiting after your treatment, we encourage you to take your nausea medication as directed.  BELOW ARE SYMPTOMS THAT SHOULD BE REPORTED IMMEDIATELY: *FEVER GREATER THAN 100.4 F (38 C) OR HIGHER *CHILLS OR SWEATING *NAUSEA AND VOMITING THAT IS NOT CONTROLLED WITH YOUR NAUSEA MEDICATION *UNUSUAL SHORTNESS OF BREATH *UNUSUAL BRUISING OR BLEEDING *URINARY PROBLEMS (pain or burning when urinating, or frequent urination) *BOWEL PROBLEMS (unusual diarrhea, constipation, pain near the anus) TENDERNESS IN MOUTH AND THROAT WITH OR WITHOUT PRESENCE OF ULCERS (sore throat, sores in mouth, or a toothache) UNUSUAL RASH, SWELLING OR PAIN  UNUSUAL VAGINAL DISCHARGE OR ITCHING   Items with * indicate a potential emergency and should be followed up as soon as possible or go to the Emergency Department if any problems should occur.  Please show the CHEMOTHERAPY ALERT CARD or IMMUNOTHERAPY ALERT CARD at check-in to  the Emergency Department and triage nurse.  Should you have questions after your visit or need to cancel or reschedule your appointment, please contact [facility] CANCER [place] AT [place]  [phone] and follow the prompts.  Office hours are 8:00 a.m. to 4:30 p.m. Monday - Friday. Please note that voicemails left after 4:00 p.m. may not be returned until the following business day.  We are closed weekends and major holidays. You have access to a nurse at all times for urgent questions. Please call the main number to the clinic [phone] and follow the prompts.  For any non-urgent questions, you may also contact your provider using MyChart. We now offer e-Visits for anyone 63 and older to request care online for non-urgent symptoms. For details visit [email or web address].   Also download the MyChart app! Go to the app store, search "MyChart", open the app, select [facility], and log in with your MyChart username and password.  Masks are optional in the cancer centers. If you would like for your care team to wear a mask while they are taking care of you, please let them know. For doctor visits, patients may have with them one support person who is at least 53 years old. At this time, visitors are not allowed in the infusion area.

## 2022-08-15 NOTE — Progress Notes (Signed)
[place]  Telephone:(336) [phone] [phone]) [phone]  ID: Monique [name] OB: [birth year]  MR#: [id number]  HCW#:[id number]  Patient Care Team: [name], MD as PCP - General (Family Medicine) Lloyd Huger, MD as Consulting Physician (Oncology) Lane Hacker, [facility] as Pharmacist (Pharmacist) Greg Cutter, LCSW as [place] Management (Licensed Clinical Social Worker) Gloris Ham, RN as Registered Nurse (Oncology) Borders, Kirt Boys, NP as Nurse Practitioner (Hospice and Palliative Medicine)  CHIEF COMPLAINT: CLL, iron deficiency.  INTERVAL HISTORY: Patient returns to clinic today for further evaluation, discussion of her imaging results, and continuation of treatment with Rituxan and Revlimid.  She is anxious, but otherwise feels well.  She is tolerating her treatments without significant side effects.  She has no neurologic complaints.  She denies any fevers or night sweats.  She has a good appetite and denies weight loss.  She has no chest pain, shortness of breath, cough, or hemoptysis.  She denies any nausea, vomiting, constipation, or diarrhea.  She has no melena or hematochezia.  She has no urinary complaints.  Patient offers no further specific complaints today.  REVIEW OF SYSTEMS:   Review of Systems  Constitutional: Negative.  Negative for diaphoresis, fever, malaise/fatigue and weight loss.  HENT: Negative.  Negative for congestion.   Respiratory: Negative.  Negative for cough and shortness of breath.   Cardiovascular: Negative.  Negative for chest pain and leg swelling.  Gastrointestinal:  Negative for abdominal pain and constipation.  Genitourinary: Negative.  Negative for dysuria and frequency.  Musculoskeletal: Negative.  Negative for back pain, myalgias and neck pain.  Skin: Negative.  Negative for rash.  Neurological: Negative.  Negative for tingling, sensory change, focal weakness and weakness.   Psychiatric/Behavioral:  Negative for depression and suicidal ideas. The patient is nervous/anxious.     As per HPI. Otherwise, a complete review of systems is negative.  PAST MEDICAL HISTORY: Past Medical History:  Diagnosis Date   Anxiety    Bursitis    leg pain   Carpal tunnel syndrome    Cervical dysplasia    hx LEEP over 18 years ago.    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  (in lymph nodes)   COPD (chronic obstructive pulmonary disease) ([place])    COVID-19 2021   Depression    Eating disorder    GERD (gastroesophageal reflux disease)    History of self-harm    Insomnia    Obsession    Tobacco abuse    Vitamin B12 deficiency (non anemic)     PAST SURGICAL HISTORY: Past Surgical History:  Procedure Laterality Date   BREAST BIOPSY Right 01/05/2018   US guided biopsy of 2 areas and 1 lymph node, MIXED INFLAMMATION AND GIANT CELL REACTION   CERVICAL BIOPSY  W/ LOOP ELECTRODE EXCISION     COLONOSCOPY WITH PROPOFOL N/A 04/21/2019   Procedure: COLONOSCOPY WITH PROPOFOL;  Surgeon: Virgel Manifold, MD;  Location: [facility] ENDOSCOPY;  Service: Gastroenterology;  Laterality: N/A;   OTHER SURGICAL HISTORY     scar tissue removed from vocal cords   TUBAL LIGATION     VAGINAL HYSTERECTOMY N/A 01/22/2021   Procedure: HYSTERECTOMY VAGINAL; BILATERAL SALPINGECTOMY;  Surgeon: Rubie Maid, MD;  Location: [facility] ORS;  Service: Gynecology;  Laterality: N/A;   vocal cord surgery  2005    FAMILY HISTORY: Family History  Problem Relation Age of Onset   Depression Mother    Cancer Mother  thyroid   Alcohol abuse Father    Alcohol abuse Brother    Depression Brother    Bipolar disorder Brother    Suicidality Brother    ADD / ADHD Son    Breast cancer Neg Hx     ADVANCED DIRECTIVES (Y/N):  N  HEALTH MAINTENANCE: Social History   Tobacco Use   Smoking status: Every Day    Packs/day: 1.00    Years: 31.00    Total pack years: 31.00    Types: Cigarettes     Start date: 09/25/1986   Smokeless tobacco: Never  Vaping Use   Vaping Use: Former  Substance Use Topics   Alcohol use: Not Currently    Alcohol/week: 0.0 standard drinks of alcohol    Comment: rarely   Drug use: Yes    Frequency: 7.0 times per week    Types: Marijuana     Colonoscopy:  PAP:  Bone density:  Lipid panel:  No Known Allergies  Current Outpatient Medications  Medication Sig Dispense Refill   albuterol (VENTOLIN HFA) 108 (90 Base) MCG/ACT inhaler Inhale 2 puffs into the lungs every 6 (six) hours as needed for wheezing or shortness of breath. 8 g 0   ARIPiprazole (ABILIFY) 5 MG tablet Take 1 tablet (5 mg total) by mouth daily. 90 tablet 1   Ascorbic Acid (VITAMIN C) 1000 MG tablet Take 1,000 mg by mouth daily as needed (immune health).     ASPIRIN 81 PO Take 81 mg by mouth daily.     atorvastatin (LIPITOR) 10 MG tablet Take 1 tablet (10 mg total) by mouth in the morning. 90 tablet 1   azithromycin (ZITHROMAX) 250 MG tablet Take 2 tablets on day 1, then 1 tablet daily on days 2 through 5 6 tablet 0   benzonatate (TESSALON) 100 MG capsule Take 1 capsule (100 mg total) by mouth 2 (two) times daily as needed for cough. 20 capsule 0   Crisaborole 2 % OINT Apply topically.     estrogens, conjugated, (PREMARIN) 0.625 MG tablet Take 1 tablet (0.625 mg total) by mouth daily. Take daily for 21 days then do not take for 7 days. 90 tablet 3   hydrocortisone cream 1 % APPLY TOPICALLY TWICE DAILY 30 g 0   lenalidomide (REVLIMID) 10 MG capsule Take 1 capsule (10 mg total) by mouth daily. 28 capsule 0   loratadine (CLARITIN) 10 MG tablet Take 1 tablet (10 mg total) by mouth every morning. 90 tablet 1   Magnesium 500 MG CAPS Take 500 mg by mouth in the morning.     meloxicam (MOBIC) 15 MG tablet Take 1 tablet (15 mg total) by mouth daily. 90 tablet 1   mupirocin ointment (BACTROBAN) 2 % Apply 1 Application topically daily. Qd to excision site 22 g 1   omeprazole (PRILOSEC) 20 MG  capsule Take 1 capsule (20 mg total) by mouth every morning. 90 capsule 3   predniSONE (DELTASONE) 5 MG tablet Take 1 tablet (5 mg total) by mouth daily with breakfast. 30 tablet 2   pregabalin (LYRICA) 100 MG capsule Take 100 mg by mouth 3 (three) times daily.     promethazine-dextromethorphan (PROMETHAZINE-DM) 6.25-15 MG/5ML syrup Take 5 mLs by mouth 4 (four) times daily as needed for cough. 118 mL 0   Ruxolitinib Phosphate 1.5 % CREA Apply topically.     tiZANidine (ZANAFLEX) 2 MG tablet Take 1 tablet (2 mg total) by mouth at bedtime. 90 tablet 1   umeclidinium-vilanterol (ANORO ELLIPTA) 62.5-25  MCG/ACT AEPB Inhale 1 puff into the lungs daily. 60 each 5   vitamin B-12 (CYANOCOBALAMIN) 500 MCG tablet Take 250 mcg by mouth every morning.     Calcium Carbonate-Vit D-Min (GNP CALCIUM 1200) 1200-1000 MG-UNIT CHEW Chew 1,200 mg by mouth daily with breakfast. Take in combination with vitamin D and magnesium. (Patient taking differently: Chew 2 tablets by mouth in the morning, at noon, and at bedtime. Take in combination with vitamin D and magnesium.) 90 tablet 3   No current facility-administered medications for this visit.    OBJECTIVE: Vitals:   08/15/22 0907  BP: 104/72  Pulse: 70  Resp: 16  Temp: (!) 97.3 F (36.3 C)  SpO2: 99%      Body mass index is 28.35 kg/m.    ECOG FS:0 - Asymptomatic  General: Well-developed, well-nourished, no acute distress. Eyes: Pink conjunctiva, anicteric sclera. HEENT: Normocephalic, moist mucous membranes. Lungs: No audible wheezing or coughing. Heart: Regular rate and rhythm. Abdomen: Soft, nontender, no obvious distention. Musculoskeletal: No edema, cyanosis, or clubbing. Neuro: Alert, answering all questions appropriately. Cranial nerves grossly intact. Skin: No rashes or petechiae noted. Psych: Normal affect. Lymphatics: No palpable lymphadenopathy.  LAB RESULTS:  Lab Results  Component Value Date   NA 138 08/15/2022   K 4.1 08/15/2022    CL 107 08/15/2022   CO2 25 08/15/2022   GLUCOSE 97 08/15/2022   BUN 19 08/15/2022   CREATININE 1.11 (H) 08/15/2022   CALCIUM 8.4 (L) 08/15/2022   PROT 6.4 (L) 08/15/2022   ALBUMIN 3.7 08/15/2022   AST 20 08/15/2022   ALT 21 08/15/2022   ALKPHOS 61 08/15/2022   BILITOT 0.6 08/15/2022   GFRNONAA 59 (L) 08/15/2022   GFRAA 79 12/06/2020    Lab Results  Component Value Date   WBC 7.0 08/15/2022   NEUTROABS 1.8 08/15/2022   HGB 14.5 08/15/2022   HCT 42.1 08/15/2022   MCV 98.1 08/15/2022   PLT 129 (L) 08/15/2022     STUDIES: CT CHEST ABDOMEN PELVIS W CONTRAST  Result Date: 08/01/2022 CLINICAL DATA:  History of CLL, follow-up.  * Tracking Code: BO * EXAM: CT CHEST, ABDOMEN, AND PELVIS WITH CONTRAST TECHNIQUE: Multidetector CT imaging of the chest, abdomen and pelvis was performed following the standard protocol during bolus administration of intravenous contrast. RADIATION DOSE REDUCTION: This exam was performed according to the departmental dose-optimization program which includes automated exposure control, adjustment of the mA and/or kV according to patient size and/or use of iterative reconstruction technique. CONTRAST:  110m OMNIPAQUE IOHEXOL 300 MG/ML  SOLN COMPARISON:  Multiple priors including most recent CT Jan 21, 2022 FINDINGS: CT CHEST FINDINGS Cardiovascular: Aortic atherosclerosis. No central pulmonary embolus on this nondedicated study. Normal size heart. No significant pericardial effusion/thickening. Mediastinum/Nodes: No suspicious thyroid nodule. Slight interval increase in size of the axillary and mediastinal lymph nodes. Indexed lymph nodes are as follows: -left axillary lymph node measures 2.5 x 1.3 cm on image 15/3 previously 2.2 x 1.1 cm. -right subpectoral lymph node measures 1.9 x 1.1 cm on image 11/3 previously 1.7 x 1.1 cm -precarinal lymph node measures 1.7 x 1.0 cm on image 19/3 previously 1.7 x 0.9 cm. Esophagus is grossly unremarkable. Lungs/Pleura: Slightly  upper lobe predominant hazy ground-glass opacities with ill-defined tiny centrilobular nodules. Centrilobular and paraseptal emphysema. Mild diffuse bronchial wall thickening. Stable pulmonary nodules. No new suspicious pulmonary nodule. Index pulmonary nodules are as follows: -right middle lobe pulmonary nodule which is centrally cavitary measures 4 mm on image 71/5, unchanged. -  perifissural right middle lobe pulmonary nodule measures 5 mm on image 74/5, unchanged. Musculoskeletal: No chest wall mass or suspicious bone lesions identified. CT ABDOMEN PELVIS FINDINGS Hepatobiliary: No suspicious hepatic lesion. Gallbladder is unremarkable. No biliary ductal dilation. Pancreas: No pancreatic ductal dilation or evidence of acute inflammation. Spleen: Mild splenomegaly measuring 14 cm in maximum axial dimension. Adrenals/Urinary Tract: Bilateral adrenal glands are within normal limits. No hydronephrosis. Kidneys demonstrate symmetric enhancement and excretion of contrast material. No solid enhancing renal mass. Urinary bladder is unremarkable for degree of distension. Stomach/Bowel: Stomach is unremarkable for degree of distension. No pathologic dilation of small or large bowel. The appendix and terminal ileum appear normal. No evidence of acute bowel inflammation. Vascular/Lymphatic: Interval progression of the abdominopelvic adenopathy. Previously indexed lymph nodes are as follows: -left periaortic lymph node measures 3.5 x 1.8 cm on image 74/3 previously 3.4 x 1.5 cm. -left external iliac chain lymph node measures 2.6 x 1.1 cm on image 109/3 previously 2.5 x 1.0 cm. -right pelvic sidewall lymph node measures 3.7 x 1.5 cm on image 101/3 previously 3.0 x 1.2 cm. Reproductive: Status post hysterectomy. No adnexal masses. Other: No abdominal wall hernia or abnormality. No abdominopelvic ascites. Musculoskeletal: No aggressive lytic or blastic lesion of bone. Hemi-sacralization of the right L5 vertebral body. No acute  osseous abnormality. IMPRESSION: 1. Increased size of the lymph nodes above and below the diaphragm, consistent with disease progression. 2. New mild splenomegaly. 3. Slightly upper lobe predominant hazy ground-glass opacities with ill-defined tiny centrilobular nodules, favored to reflect smoking related respiratory bronchiolitis. 4. Stable small pulmonary nodules. 5. Aortic Atherosclerosis (ICD10-I70.0) and Emphysema (ICD10-J43.9). Electronically Signed   By: Dahlia Bailiff M.D.   On: 08/01/2022 10:39   CT SOFT TISSUE NECK W CONTRAST  Result Date: 08/01/2022 CLINICAL DATA:  Restaging CLL EXAM: CT NECK WITH CONTRAST TECHNIQUE: Multidetector CT imaging of the neck was performed using the standard protocol following the bolus administration of intravenous contrast. RADIATION DOSE REDUCTION: This exam was performed according to the departmental dose-optimization program which includes automated exposure control, adjustment of the mA and/or kV according to patient size and/or use of iterative reconstruction technique. CONTRAST:  164m OMNIPAQUE IOHEXOL 300 MG/ML  SOLN COMPARISON:  CT CAP 02/21/22, CT Neck 06/13/20 FINDINGS: Pharynx and larynx: Normal. No mass or swelling. Salivary glands: No inflammation, mass, or stone. Thyroid: Normal. Lymph nodes: Compared to recent prior CT CAP: redemonstrated are enlarged bilateral axillary lymph nodes which appears slightly increased in size compared to prior exam, for example a right axillary lymph node (series 11, image 1 series 3) now measures up to 1.2 cm, previously 1.1 cm. There is similar increase in size in the left axillary lymph node which now measures up to 1.5 cm, previously 1.4 cm (series 11, image 96). Compared to the most recent CT neck: There is interval increase in size of multiple cervical lymph nodes, for example a left level IIb lymph node (series 11, image 40) now measures 7 mm, previously 4 mm. Similar increase in size also seen of a left level 5 lymph  node which now measures up to 5 mm in short axis, previously 3 mm (series 11, image 56). Vascular: Negative. Limited intracranial: Negative. Visualized orbits: Negative. Mastoids and visualized paranasal sinuses: Mild mucosal thickening right maxillary sinus. No mastoid effusion. Skeleton: No acute or aggressive process. Upper chest: Moderate centrilobular emphysema. Please see same day CT chest abdomen and pelvis for additional findings. Other: None. IMPRESSION: 1. Minimal interval increase in size  of a few cervical and bilateral axillary lymph nodes, as described above. None of the lymph nodes have increased by more than 50% to suggest progressive disease. 2. Please see same day CT chest abdomen and pelvis for additional findings. Emphysema (ICD10-J43.9). Electronically Signed   By: Marin Roberts M.D.   On: 08/01/2022 10:00    ONCOLOGY HISTORY:  Patient completed cycle 3 of Rituxan plus Treanda on March 26, 2018, but was then noted to have progressive disease.  She was then initiated on 480 mg Imbruvica in November 2019, but had progression of disease with increasing white blood cell count as well as CT scan on Jan 22, 2022 revealing increased lymphadenopathy consistent with progression of disease.  Attempted patient on venetoclax, but she could not tolerate it secondary to persistent diarrhea and transaminitis.   ASSESSMENT: CLL,  iron deficiency.  PLAN:    1. CLL: Confirmed by peripheral blood flow cytometry.  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  Patient recently completed 4 weekly cycles of Rituxan and now has transition to treatment every 4 weeks.  CT scan results from July 31, 2022 revealed mild progression of disease, but will continue current treatment at this time.  Patient expressed understanding that we will likely need to change treatments in the near future.  She has been instructed to continue prednisone,  Claritin, and famotidine along with her 10 mg Revlimid daily.  Proceed with cycle 9 of treatment today.  Return to clinic in 4 weeks for further evaluation and consideration of cycle 10.  Reimage in February 2024.  Appreciate clinical pharmacy input.   2.  Leukocytosis: Resolved. 3.  Trigeminal neuralgia: Resolved.  MRI of the brain on Jan 25, 2019 was unremarkable.   4.  Depression/anxiety: Chronic and unchanged.  Continue follow-up and treatment per primary care. 5.  Neuropathic pain/peripheral neuropathy: Does not complain of this today.  Patient reports she is now on disability.  Continue current follow-up and treatment as per neurology. 6.  Iron deficiency: Resolved.   7.  Rituxan reaction: Patient has been given additional premedications for preventative measures.  She did not have a reaction during cycle 2 or 3. 8.  Thrombocytopenia: Chronic and unchanged.  Mild, monitor.   9.  Rash: Resolved with prednisone, Claritin, and famotidine.  Continue Revlimid as above.  If patient remains on prednisone long-term, she will require bone mineral density.   Patient expressed understanding and was in agreement with this plan. She also understands that She can call clinic at any time with any questions, concerns, or complaints.    Lloyd Huger, MD   08/15/2022 1:28 PM

## 2022-08-22 ENCOUNTER — Other Ambulatory Visit: Payer: Self-pay | Admitting: Oncology

## 2022-08-22 ENCOUNTER — Other Ambulatory Visit: Payer: Self-pay | Admitting: Family Medicine

## 2022-08-22 DIAGNOSIS — C9110 Chronic lymphocytic leukemia of B-cell type not having achieved remission: Secondary | ICD-10-CM

## 2022-08-22 DIAGNOSIS — J432 Centrilobular emphysema: Secondary | ICD-10-CM

## 2022-08-22 NOTE — Telephone Encounter (Signed)
Requested by interface surescripts. Medication discontinued 06/13/21.  Requested Prescriptions  Refused Prescriptions Disp Refills   ADVAIR DISKUS 250-50 MCG/ACT AEPB [Pharmacy Med Name: Advair Diskus 250 mcg-50 mcg/dose powder for inhalation] 180 each 0    Sig: INHALE 1 PUFF BY MOUTH INTO LUNGS TWICE DAILY     Pulmonology:  Combination Products Passed - 08/22/2022  3:12 PM      Passed - Valid encounter within last 12 months    Recent Outpatient Visits           1 week ago Subacute cough   [place], FNP   4 months ago Thoracic aortic atherosclerosis [facility])   [facility] [name], MD   5 months ago Skin infection   [place], FNP   7 months ago Anxiety, generalized   [facility] Rory Percy M, DO   8 months ago Closed fracture of coccyx with routine healing, subsequent encounter   [facility] Bo Merino, FNP       Future Appointments             In 1 month [name], MD [facility], [facility]

## 2022-09-05 ENCOUNTER — Other Ambulatory Visit: Payer: Self-pay | Admitting: *Deleted

## 2022-09-05 DIAGNOSIS — C9110 Chronic lymphocytic leukemia of B-cell type not having achieved remission: Secondary | ICD-10-CM

## 2022-09-05 MED ORDER — LENALIDOMIDE 10 MG PO CAPS: 10.0000 mg | ORAL_CAPSULE | Freq: Every day | ORAL | 0 refills | Status: DC

## 2022-09-11 MED FILL — Dexamethasone Sodium Phosphate Inj 100 MG/10ML: INTRAMUSCULAR | Qty: 2 | Status: AC

## 2022-09-12 ENCOUNTER — Inpatient Hospital Stay: Payer: Medicare Other | Attending: Oncology

## 2022-09-12 ENCOUNTER — Inpatient Hospital Stay: Payer: Medicare Other

## 2022-09-12 ENCOUNTER — Inpatient Hospital Stay (HOSPITAL_BASED_OUTPATIENT_CLINIC_OR_DEPARTMENT_OTHER): Payer: Medicare Other | Admitting: Oncology

## 2022-09-12 ENCOUNTER — Encounter: Payer: Self-pay | Admitting: Oncology

## 2022-09-12 ENCOUNTER — Inpatient Hospital Stay: Payer: Medicare Other | Admitting: Pharmacist

## 2022-09-12 VITALS — BP 109/77 | HR 67

## 2022-09-12 DIAGNOSIS — C9110 Chronic lymphocytic leukemia of B-cell type not having achieved remission: Secondary | ICD-10-CM

## 2022-09-12 DIAGNOSIS — Z5112 Encounter for antineoplastic immunotherapy: Secondary | ICD-10-CM | POA: Diagnosis present

## 2022-09-12 LAB — COMPREHENSIVE METABOLIC PANEL
ALT: 15 U/L (ref 0–44)
AST: 14 U/L — ABNORMAL LOW (ref 15–41)
Albumin: 4.1 g/dL (ref 3.5–5.0)
Alkaline Phosphatase: 65 U/L (ref 38–126)
Anion gap: 8 (ref 5–15)
BUN: 17 mg/dL (ref 6–20)
CO2: 24 mmol/L (ref 22–32)
Calcium: 8.7 mg/dL — ABNORMAL LOW (ref 8.9–10.3)
Chloride: 108 mmol/L (ref 98–111)
Creatinine, Ser: 1.04 mg/dL — ABNORMAL HIGH (ref 0.44–1.00)
GFR, Estimated: >60 mL/min (ref >60)
Glucose, Bld: 98 mg/dL (ref 70–99)
Potassium: 4.1 mmol/L (ref 3.5–5.1)
Sodium: 140 mmol/L (ref 135–145)
Total Bilirubin: 0.9 mg/dL (ref 0.3–1.2)
Total Protein: 6.6 g/dL (ref 6.5–8.1)

## 2022-09-12 LAB — CBC WITH DIFFERENTIAL/PLATELET
Abs Immature Granulocytes: 0.02 10*3/uL (ref 0.00–0.07)
Basophils Absolute: 0.1 10*3/uL (ref 0.0–0.1)
Basophils Relative: 1 %
Eosinophils Absolute: 0.2 10*3/uL (ref 0.0–0.5)
Eosinophils Relative: 3 %
HCT: 41.5 % (ref 36.0–46.0)
Hemoglobin: 14.5 g/dL (ref 12.0–15.0)
Immature Granulocytes: 0 %
Lymphocytes Relative: 35 %
Lymphs Abs: 2.5 10*3/uL (ref 0.7–4.0)
MCH: 34.3 pg — ABNORMAL HIGH (ref 26.0–34.0)
MCHC: 34.9 g/dL (ref 30.0–36.0)
MCV: 98.1 fL (ref 80.0–100.0)
Monocytes Absolute: 1.6 10*3/uL — ABNORMAL HIGH (ref 0.1–1.0)
Monocytes Relative: 22 %
Neutro Abs: 2.7 10*3/uL (ref 1.7–7.7)
Neutrophils Relative %: 39 %
Platelets: 135 10*3/uL — ABNORMAL LOW (ref 150–400)
RBC: 4.23 MIL/uL (ref 3.87–5.11)
RDW: 14.1 % (ref 11.5–15.5)
WBC: 7 10*3/uL (ref 4.0–10.5)
nRBC: 0 % (ref 0.0–0.2)

## 2022-09-12 MED ORDER — SODIUM CHLORIDE 0.9 % IV SOLN
375.0000 mg/m2 | Freq: Once | INTRAVENOUS | Status: AC
  Administered 2022-09-12: 700 mg via INTRAVENOUS
  Filled 2022-09-12: qty 50

## 2022-09-12 MED ORDER — SODIUM CHLORIDE 0.9 % IV SOLN
20.0000 mg | Freq: Once | INTRAVENOUS | Status: AC
  Administered 2022-09-12: 20 mg via INTRAVENOUS
  Filled 2022-09-12: qty 20

## 2022-09-12 MED ORDER — DIPHENHYDRAMINE HCL 25 MG PO CAPS
50.0000 mg | ORAL_CAPSULE | Freq: Once | ORAL | Status: AC
  Administered 2022-09-12: 50 mg via ORAL
  Filled 2022-09-12: qty 2

## 2022-09-12 MED ORDER — SODIUM CHLORIDE 0.9 % IV SOLN
Freq: Once | INTRAVENOUS | Status: AC
  Filled 2022-09-12: qty 250

## 2022-09-12 MED ORDER — ACETAMINOPHEN 325 MG PO TABS
650.0000 mg | ORAL_TABLET | Freq: Once | ORAL | Status: AC
  Administered 2022-09-12: 650 mg via ORAL
  Filled 2022-09-12: qty 2

## 2022-09-12 MED ORDER — FAMOTIDINE IN NACL 20-0.9 MG/50ML-% IV SOLN
20.0000 mg | Freq: Once | INTRAVENOUS | Status: AC
  Administered 2022-09-12: 20 mg via INTRAVENOUS
  Filled 2022-09-12: qty 50

## 2022-09-12 NOTE — Progress Notes (Signed)
Pt has fatigue from her meds. Occ nausea. Has constant pain in lower legs from neuropathy.

## 2022-09-12 NOTE — Patient Instructions (Signed)
[facility] CANCER CTR AT [place]  Discharge Instructions: Thank you for choosing [facility] to provide your oncology and hematology care.  If you have a lab appointment with the [facility], please go directly to the [facility] and check in at the registration area.  Wear comfortable clothing and clothing appropriate for easy access to any Portacath or PICC line.   We strive to give you quality time with your provider. You may need to reschedule your appointment if you arrive late (15 or more minutes).  Arriving late affects you and other patients whose appointments are after yours.  Also, if you miss three or more appointments without notifying the office, you may be dismissed from the clinic at the provider's discretion.      For prescription refill requests, have your pharmacy contact our office and allow 72 hours for refills to be completed.    Today you received the following chemotherapy and/or immunotherapy agents- Rituximab      To help prevent nausea and vomiting after your treatment, we encourage you to take your nausea medication as directed.  BELOW ARE SYMPTOMS THAT SHOULD BE REPORTED IMMEDIATELY: *FEVER GREATER THAN 100.4 F (38 C) OR HIGHER *CHILLS OR SWEATING *NAUSEA AND VOMITING THAT IS NOT CONTROLLED WITH YOUR NAUSEA MEDICATION *UNUSUAL SHORTNESS OF BREATH *UNUSUAL BRUISING OR BLEEDING *URINARY PROBLEMS (pain or burning when urinating, or frequent urination) *BOWEL PROBLEMS (unusual diarrhea, constipation, pain near the anus) TENDERNESS IN MOUTH AND THROAT WITH OR WITHOUT PRESENCE OF ULCERS (sore throat, sores in mouth, or a toothache) UNUSUAL RASH, SWELLING OR PAIN  UNUSUAL VAGINAL DISCHARGE OR ITCHING   Items with * indicate a potential emergency and should be followed up as soon as possible or go to the Emergency Department if any problems should occur.  Please show the CHEMOTHERAPY ALERT CARD or IMMUNOTHERAPY ALERT CARD at check-in to  the Emergency Department and triage nurse.  Should you have questions after your visit or need to cancel or reschedule your appointment, please contact [facility] CANCER [place] AT [place]  [phone] and follow the prompts.  Office hours are 8:00 a.m. to 4:30 p.m. Monday - Friday. Please note that voicemails left after 4:00 p.m. may not be returned until the following business day.  We are closed weekends and major holidays. You have access to a nurse at all times for urgent questions. Please call the main number to the clinic [phone] and follow the prompts.  For any non-urgent questions, you may also contact your provider using MyChart. We now offer e-Visits for anyone 48 and older to request care online for non-urgent symptoms. For details visit [email or web address].   Also download the MyChart app! Go to the app store, search "MyChart", open the app, select [place], and log in with your MyChart username and password.

## 2022-09-12 NOTE — Progress Notes (Signed)
[place]  [phone] [phone]  ID: Monique [name] OB: 07/20/1971  MR#: [id number]  SAY#:[id number]  Patient Care Team: [name], MD as PCP - General (Family Medicine) Lloyd Huger, MD as Consulting Physician (Oncology) Lane Hacker, [facility] as Pharmacist (Pharmacist) Greg Cutter, LCSW as [place] Management (Licensed Clinical Social Worker) Gloris Ham, RN as Registered Nurse (Oncology) Borders, Kirt Boys, NP as Nurse Practitioner (Hospice and Palliative Medicine)  CHIEF COMPLAINT: CLL.  INTERVAL HISTORY: Patient returns to clinic today for further evaluation and continuation of Rituxan and Revlimid.  She continues to have a persistent peripheral neuropathy, but otherwise feels well.  She is tolerating her treatments without significant side effects.  She has no other neurologic complaints.  She denies any fevers or night sweats.  She has a good appetite and denies weight loss.  She has no chest pain, shortness of breath, cough, or hemoptysis.  She denies any nausea, vomiting, constipation, or diarrhea.  She has no melena or hematochezia.  She has no urinary complaints.  Patient offers no further specific complaints today.  REVIEW OF SYSTEMS:   Review of Systems  Constitutional: Negative.  Negative for diaphoresis, fever, malaise/fatigue and weight loss.  HENT: Negative.  Negative for congestion.   Respiratory: Negative.  Negative for cough and shortness of breath.   Cardiovascular: Negative.  Negative for chest pain and leg swelling.  Gastrointestinal:  Negative for abdominal pain and constipation.  Genitourinary: Negative.  Negative for dysuria and frequency.  Musculoskeletal: Negative.  Negative for back pain, myalgias and neck pain.  Skin: Negative.  Negative for rash.  Neurological:  Positive for tingling and sensory change. Negative for focal weakness and weakness.  Psychiatric/Behavioral:  Negative.  Negative for depression and suicidal ideas. The patient is not nervous/anxious.     As per HPI. Otherwise, a complete review of systems is negative.  PAST MEDICAL HISTORY: Past Medical History:  Diagnosis Date   Anxiety    Bursitis    leg pain   Carpal tunnel syndrome    Cervical dysplasia    hx LEEP over 18 years ago.    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  (in lymph nodes)   COPD (chronic obstructive pulmonary disease) ([place])    COVID-19 2021   Depression    Eating disorder    GERD (gastroesophageal reflux disease)    History of self-harm    Insomnia    Obsession    Tobacco abuse    Vitamin B12 deficiency (non anemic)     PAST SURGICAL HISTORY: Past Surgical History:  Procedure Laterality Date   BREAST BIOPSY Right 01/05/2018   US guided biopsy of 2 areas and 1 lymph node, MIXED INFLAMMATION AND GIANT CELL REACTION   CERVICAL BIOPSY  W/ LOOP ELECTRODE EXCISION     COLONOSCOPY WITH PROPOFOL N/A 04/21/2019   Procedure: COLONOSCOPY WITH PROPOFOL;  Surgeon: Virgel Manifold, MD;  Location: [facility] ENDOSCOPY;  Service: Gastroenterology;  Laterality: N/A;   OTHER SURGICAL HISTORY     scar tissue removed from vocal cords   TUBAL LIGATION     VAGINAL HYSTERECTOMY N/A 01/22/2021   Procedure: HYSTERECTOMY VAGINAL; BILATERAL SALPINGECTOMY;  Surgeon: Rubie Maid, MD;  Location: [facility] ORS;  Service: Gynecology;  Laterality: N/A;   vocal cord surgery  2005    FAMILY HISTORY: Family History  Problem Relation Age of Onset   Depression Mother    Cancer Mother  thyroid   Alcohol abuse Father    Alcohol abuse Brother    Depression Brother    Bipolar disorder Brother    Suicidality Brother    ADD / ADHD Son    Breast cancer Neg Hx     ADVANCED DIRECTIVES (Y/N):  N  HEALTH MAINTENANCE: Social History   Tobacco Use   Smoking status: Every Day    Packs/day: 1.00    Years: 31.00    Total pack years: 31.00    Types: Cigarettes    Start  date: 09/25/1986   Smokeless tobacco: Never  Vaping Use   Vaping Use: Former  Substance Use Topics   Alcohol use: Not Currently    Alcohol/week: 0.0 standard drinks of alcohol    Comment: rarely   Drug use: Yes    Frequency: 7.0 times per week    Types: Marijuana     Colonoscopy:  PAP:  Bone density:  Lipid panel:  No Known Allergies  Current Outpatient Medications  Medication Sig Dispense Refill   albuterol (VENTOLIN HFA) 108 (90 Base) MCG/ACT inhaler Inhale 2 puffs into the lungs every 6 (six) hours as needed for wheezing or shortness of breath. 8 g 0   ARIPiprazole (ABILIFY) 5 MG tablet Take 1 tablet (5 mg total) by mouth daily. 90 tablet 1   Ascorbic Acid (VITAMIN C) 1000 MG tablet Take 1,000 mg by mouth daily as needed (immune health).     ASPIRIN 81 PO Take 81 mg by mouth daily.     atorvastatin (LIPITOR) 10 MG tablet Take 1 tablet (10 mg total) by mouth in the morning. 90 tablet 1   benzonatate (TESSALON) 100 MG capsule Take 1 capsule (100 mg total) by mouth 2 (two) times daily as needed for cough. 20 capsule 0   Calcium Carbonate-Vit D-Min (GNP CALCIUM 1200) 1200-1000 MG-UNIT CHEW Chew 1,200 mg by mouth daily with breakfast. Take in combination with vitamin D and magnesium. (Patient taking differently: Chew 2 tablets by mouth in the morning, at noon, and at bedtime. Take in combination with vitamin D and magnesium.) 90 tablet 3   Crisaborole 2 % OINT Apply topically.     estrogens, conjugated, (PREMARIN) 0.625 MG tablet Take 1 tablet (0.625 mg total) by mouth daily. Take daily for 21 days then do not take for 7 days. 90 tablet 3   hydrocortisone cream 1 % APPLY TOPICALLY TWICE DAILY 30 g 0   lenalidomide (REVLIMID) 10 MG capsule Take 1 capsule (10 mg total) by mouth daily. 28 capsule 0   loratadine (CLARITIN) 10 MG tablet Take 1 tablet (10 mg total) by mouth every morning. 90 tablet 1   Magnesium 500 MG CAPS Take 500 mg by mouth in the morning.     meloxicam (MOBIC) 15 MG  tablet Take 1 tablet (15 mg total) by mouth daily. 90 tablet 1   mupirocin ointment (BACTROBAN) 2 % Apply 1 Application topically daily. Qd to excision site 22 g 1   omeprazole (PRILOSEC) 20 MG capsule Take 1 capsule (20 mg total) by mouth every morning. 90 capsule 3   predniSONE (DELTASONE) 5 MG tablet TAKE ONE TABLET BY MOUTH daily WITH BREAKFAST AFTER finishing THE 10 MG taper 30 tablet 2   pregabalin (LYRICA) 100 MG capsule Take 100 mg by mouth 3 (three) times daily.     promethazine-dextromethorphan (PROMETHAZINE-DM) 6.25-15 MG/5ML syrup Take 5 mLs by mouth 4 (four) times daily as needed for cough. 118 mL 0   Ruxolitinib Phosphate 1.5 %  CREA Apply topically.     tiZANidine (ZANAFLEX) 2 MG tablet Take 1 tablet (2 mg total) by mouth at bedtime. 90 tablet 1   umeclidinium-vilanterol (ANORO ELLIPTA) 62.5-25 MCG/ACT AEPB Inhale 1 puff into the lungs daily. 60 each 5   vitamin B-12 (CYANOCOBALAMIN) 500 MCG tablet Take 250 mcg by mouth every morning.     No current facility-administered medications for this visit.    OBJECTIVE: Vitals:   09/12/22 0939  BP: 97/64  Pulse: 70  Resp: 17  Temp: 98.8 F (37.1 C)  SpO2: 98%      Body mass index is 28.04 kg/m.    ECOG FS:0 - Asymptomatic  General: Well-developed, well-nourished, no acute distress. Eyes: Pink conjunctiva, anicteric sclera. HEENT: Normocephalic, moist mucous membranes. Lungs: No audible wheezing or coughing. Heart: Regular rate and rhythm. Abdomen: Soft, nontender, no obvious distention. Musculoskeletal: No edema, cyanosis, or clubbing. Neuro: Alert, answering all questions appropriately. Cranial nerves grossly intact. Skin: No rashes or petechiae noted. Psych: Normal affect. Lymphatics: No palpable lymphadenopathy.  LAB RESULTS:  Lab Results  Component Value Date   NA 140 09/12/2022   K 4.1 09/12/2022   CL 108 09/12/2022   CO2 24 09/12/2022   GLUCOSE 98 09/12/2022   BUN 17 09/12/2022   CREATININE 1.04 (H)  09/12/2022   CALCIUM 8.7 (L) 09/12/2022   PROT 6.6 09/12/2022   ALBUMIN 4.1 09/12/2022   AST 14 (L) 09/12/2022   ALT 15 09/12/2022   ALKPHOS 65 09/12/2022   BILITOT 0.9 09/12/2022   GFRNONAA >60 09/12/2022   GFRAA 79 12/06/2020    Lab Results  Component Value Date   WBC 7.0 09/12/2022   NEUTROABS 2.7 09/12/2022   HGB 14.5 09/12/2022   HCT 41.5 09/12/2022   MCV 98.1 09/12/2022   PLT 135 (L) 09/12/2022     STUDIES: No results found.  ONCOLOGY HISTORY:  Patient completed cycle 3 of Rituxan plus Treanda on March 26, 2018, but was then noted to have progressive disease.  She was then initiated on 480 mg Imbruvica in November 2019, but had progression of disease with increasing white blood cell count as well as CT scan on Jan 22, 2022 revealing increased lymphadenopathy consistent with progression of disease.  Attempted patient on venetoclax, but she could not tolerate it secondary to persistent diarrhea and transaminitis.   ASSESSMENT: CLL.  PLAN:    1. CLL: Confirmed by peripheral blood flow cytometry.  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  Patient recently completed 4 weekly cycles of Rituxan and now has transition to treatment every 4 weeks.  CT scan results from July 31, 2022 revealed mild progression of disease, but will continue current treatment at this time.  Patient expressed understanding that we will likely need to change treatments in the near future.  She has been instructed to continue prednisone, Claritin, and famotidine along with her 10 mg Revlimid daily.  Proceed with cycle 10 of treatment today.  Return to clinic in 4 weeks for further evaluation and consideration of cycle 11.  Will reimage in February 2024.  Appreciate clinical pharmacy input.   2.  Leukocytosis: Resolved. 3.  Trigeminal neuralgia: Resolved.  MRI of the brain on Jan 25, 2019 was unremarkable.   4.  Depression/anxiety:  Chronic and unchanged.  Continue follow-up and treatment per primary care. 5.  Neuropathic pain/peripheral neuropathy: Slightly worse, patient does not wish to change her treatment  regimen at this time.  She reports she is now on disability.  Continue current follow-up and treatment as per neurology. 6.  Iron deficiency: Resolved.   7.  Rituxan reaction: Patient has been given additional premedications for preventative measures.  She did not have a reaction during cycle 2 or 3. 8.  Thrombocytopenia: Chronic and unchanged.  Patient platelet count is 135 today. 9.  Rash: Resolved with prednisone, Claritin, and famotidine.  Continue Revlimid as above.  If patient remains on prednisone long-term, she will require bone mineral density.   Patient expressed understanding and was in agreement with this plan. She also understands that She can call clinic at any time with any questions, concerns, or complaints.    Lloyd Huger, MD   09/12/2022 12:06 PM

## 2022-09-12 NOTE — Progress Notes (Signed)
[place]  Telephone:([phone] [phone]  Patient Care Team: [name], MD as PCP - General (Family Medicine) Lloyd Huger, MD as Consulting Physician (Oncology) Lane Hacker, [facility] as Pharmacist (Pharmacist) Greg Cutter, LCSW as [place] Management (Licensed Clinical Social Worker) Gloris Ham, RN as Registered Nurse (Oncology) Borders, Kirt Boys, NP as Nurse Practitioner [facility] and Palliative Medicine)   Name of the patient: Monique [name]  [id number]  1971/08/14   Date of visit: 09/12/2022  HPI: Patient is a 51 y.o. female with  progressive CLL, previously treated with ibrutinib. Patient was intolerant to venetoclax treatment initiation and did not want to retry initiating venetoclax. The plan is to now treat her with rituximab and lenalidomide. Patient started rituximab on 02/28/22 and lenalidomide 03/01/22.  On 04/03/22, patient presented to clinic with a rash. Her lenalidomide was held and she was treated with prednisone (1 week) and loratadine continuosly. Rash improved by 04/10/22 f/u appt and she was restarted on lenalidomide.  On 04/28/22, patient called to report her rash had worsened and she stopped the lenalidomide on 04/23/22. She was stated on another prednisone taper. She was to taper down to '5mg'$  daily, then resume her lenalidomide. She resumed her lenalidomide on 05/16/22.   Reason for Consult: Oral chemotherapy follow-up for lenalidomide therapy.   PAST MEDICAL HISTORY: Past Medical History:  Diagnosis Date   Anxiety    Bursitis    leg pain   Carpal tunnel syndrome    Cervical dysplasia    hx LEEP over 18 years ago.    Chronic lymphocytic leukemia ([place]) 2018   Dr Grayland Ormond.  (in lymph nodes)   COPD (chronic obstructive pulmonary disease) ([place])    COVID-19 2021   Depression    Eating disorder    GERD (gastroesophageal reflux disease)    History of self-harm     Insomnia    Obsession    Tobacco abuse    Vitamin B12 deficiency (non anemic)     HEMATOLOGY/ONCOLOGY HISTORY:  Oncology History Overview Note  Patient with recent CLL diagnosis by flow cytometry.  Previously not being treated in observation.   S/p 4 cycles of weekly Rituxan completing on 02/20/2017 scans from January 2018 revealed minimal progression.   Developed right breast mass in March 2019.  Had diagnostic mammogram performed on 12/26/2017 showed suspicious right breast mass.  Biopsy revealed granulomatous mastitis.  She was treated with doxycycline 100 mg twice daily for 7 days.   Developed worsening adenopathy.  CT chest/abdomen/pelvis and soft tissue neck from May 2019 revealed progressive bulky adenopathy in the chest, abdomen and pelvis compatible with worsening disease.   [name] recommended beginning treatment with Rituxan and Treanda.  Received first cycle last week.  S/p 6 cycles of Rituxan and Treanda.  Tolerating well.  Last dose given 03/25/18.   CLL (chronic lymphocytic leukemia) ([place])  11/24/2016 Initial Diagnosis   CLL (chronic lymphocytic leukemia) ([place])   01/21/2018 - 03/26/2018 Chemotherapy   The patient had palonosetron (ALOXI) injection 0.25 mg, 0.25 mg, Intravenous,  Once, 4 of 5 cycles Administration: 0.25 mg (01/28/2018), 0.25 mg (02/25/2018), 0.25 mg (03/25/2018) riTUXimab (RITUXAN) 700 mg in sodium chloride 0.9 % 250 mL (2.1875 mg/mL) infusion, 375 mg/m2 = 700 mg, Intravenous,  Once, 4 of 5 cycles Administration: 700 mg (01/28/2018) bendamustine (BENDEKA) 150 mg in sodium chloride 0.9 % 50 mL (2.6786 mg/mL) chemo infusion, 90 mg/m2 = 150 mg, Intravenous,  Once, 4 of  5 cycles Administration: 150 mg (01/28/2018), 150 mg (01/29/2018), 150 mg (02/25/2018), 150 mg (02/26/2018), 150 mg (03/25/2018), 150 mg (03/26/2018)  for chemotherapy treatment.    02/28/2022 - 04/18/2022 Chemotherapy   Patient is on Treatment Plan : NON-HODGKINS LYMPHOMA Rituximab q7d     02/28/2022 -   Chemotherapy   Patient is on Treatment Plan : NON-HODGKINS LYMPHOMA Rituximab q7d     03/12/2022 - 03/12/2022 Chemotherapy   Patient is on Treatment Plan : LYMPHOMA CLL/SLL Venetoclax ramp up / Venetoclax + Rituximab (375/500) q28d x 6 cycles / Venetoclax q28d x 18 cycles       ALLERGIES:  has No Known Allergies.  MEDICATIONS:  Current Outpatient Medications  Medication Sig Dispense Refill   albuterol (VENTOLIN HFA) 108 (90 Base) MCG/ACT inhaler Inhale 2 puffs into the lungs every 6 (six) hours as needed for wheezing or shortness of breath. 8 g 0   ARIPiprazole (ABILIFY) 5 MG tablet Take 1 tablet (5 mg total) by mouth daily. 90 tablet 1   Ascorbic Acid (VITAMIN C) 1000 MG tablet Take 1,000 mg by mouth daily as needed (immune health).     ASPIRIN 81 PO Take 81 mg by mouth daily.     atorvastatin (LIPITOR) 10 MG tablet Take 1 tablet (10 mg total) by mouth in the morning. 90 tablet 1   benzonatate (TESSALON) 100 MG capsule Take 1 capsule (100 mg total) by mouth 2 (two) times daily as needed for cough. 20 capsule 0   Calcium Carbonate-Vit D-Min (GNP CALCIUM 1200) 1200-1000 MG-UNIT CHEW Chew 1,200 mg by mouth daily with breakfast. Take in combination with vitamin D and magnesium. (Patient taking differently: Chew 2 tablets by mouth in the morning, at noon, and at bedtime. Take in combination with vitamin D and magnesium.) 90 tablet 3   Crisaborole 2 % OINT Apply topically.     estrogens, conjugated, (PREMARIN) 0.625 MG tablet Take 1 tablet (0.625 mg total) by mouth daily. Take daily for 21 days then do not take for 7 days. 90 tablet 3   hydrocortisone cream 1 % APPLY TOPICALLY TWICE DAILY 30 g 0   lenalidomide (REVLIMID) 10 MG capsule Take 1 capsule (10 mg total) by mouth daily. 28 capsule 0   loratadine (CLARITIN) 10 MG tablet Take 1 tablet (10 mg total) by mouth every morning. 90 tablet 1   Magnesium 500 MG CAPS Take 500 mg by mouth in the morning.     meloxicam (MOBIC) 15 MG tablet Take 1  tablet (15 mg total) by mouth daily. 90 tablet 1   mupirocin ointment (BACTROBAN) 2 % Apply 1 Application topically daily. Qd to excision site 22 g 1   omeprazole (PRILOSEC) 20 MG capsule Take 1 capsule (20 mg total) by mouth every morning. 90 capsule 3   predniSONE (DELTASONE) 5 MG tablet TAKE ONE TABLET BY MOUTH daily WITH BREAKFAST AFTER finishing THE 10 MG taper 30 tablet 2   pregabalin (LYRICA) 100 MG capsule Take 100 mg by mouth 3 (three) times daily.     promethazine-dextromethorphan (PROMETHAZINE-DM) 6.25-15 MG/5ML syrup Take 5 mLs by mouth 4 (four) times daily as needed for cough. 118 mL 0   Ruxolitinib Phosphate 1.5 % CREA Apply topically.     tiZANidine (ZANAFLEX) 2 MG tablet Take 1 tablet (2 mg total) by mouth at bedtime. 90 tablet 1   umeclidinium-vilanterol (ANORO ELLIPTA) 62.5-25 MCG/ACT AEPB Inhale 1 puff into the lungs daily. 60 each 5   vitamin B-12 (CYANOCOBALAMIN) 500 MCG tablet  Take 250 mcg by mouth every morning.     No current facility-administered medications for this visit.    VITAL SIGNS: LMP 08/17/2016  There were no vitals filed for this visit.  Estimated body mass index is 28.35 kg/m as calculated from the following:   Height as of 08/15/22: '5\' 7"'$  (1.702 m).   Weight as of 08/15/22: 82.1 kg (181 lb).  LABS: CBC:    Component Value Date/Time   WBC 7.0 09/12/2022 0849   HGB 14.5 09/12/2022 0849   HCT 41.5 09/12/2022 0849   PLT 135 (L) 09/12/2022 0849   MCV 98.1 09/12/2022 0849   NEUTROABS 2.7 09/12/2022 0849   LYMPHSABS 2.5 09/12/2022 0849   MONOABS 1.6 (H) 09/12/2022 0849   EOSABS 0.2 09/12/2022 0849   BASOSABS 0.1 09/12/2022 0849   Comprehensive Metabolic Panel:    Component Value Date/Time   NA 140 09/12/2022 0849   NA 138 05/13/2018 1413   K 4.1 09/12/2022 0849   CL 108 09/12/2022 0849   CO2 24 09/12/2022 0849   BUN 17 09/12/2022 0849   BUN 15 05/13/2018 1413   CREATININE 1.04 (H) 09/12/2022 0849   CREATININE 0.96 12/06/2020 1646    GLUCOSE 98 09/12/2022 0849   CALCIUM 8.7 (L) 09/12/2022 0849   AST 14 (L) 09/12/2022 0849   ALT 15 09/12/2022 0849   ALKPHOS 65 09/12/2022 0849   BILITOT 0.9 09/12/2022 0849   BILITOT 0.4 05/13/2018 1413   PROT 6.6 09/12/2022 0849   PROT 6.3 05/13/2018 1413   ALBUMIN 4.1 09/12/2022 0849   ALBUMIN 4.1 05/13/2018 1413     Present during today's visit: patient only, seen in infusion  Assessment and Plan: CBC/CMP reviewed, continue lenalidomide '10mg'$  daily   Oral Chemotherapy Side Effect/Intolerance:  Currently no reported lenalidomide related side effects  Oral Chemotherapy Adherence: No missed doses reported  New medications: None reported.  Medication Access Issues: None reported.  Patient expressed understanding and was in agreement with this plan. She also understands that She can call clinic at any time with any questions, concerns, or complaints.   Follow-up plan: RTC 4 weeks  Thank you for allowing me to participate in the care of this very pleasant patient.   Time Total: 15 minutes  Visit consisted of counseling and education on dealing with issues of symptom management in the setting of serious and potentially life-threatening illness.Greater than 50%  of this time was spent counseling and coordinating care related to the above assessment and plan.  Signed by: [name], PharmD, BCPS, Salley Slaughter, CPP Hematology/Oncology Clinical Pharmacist Practitioner [place]/DB/AP Oral [facility] [phone]  09/12/2022 9:39 AM

## 2022-09-18 ENCOUNTER — Other Ambulatory Visit: Payer: Self-pay | Admitting: Family Medicine

## 2022-09-18 DIAGNOSIS — J432 Centrilobular emphysema: Secondary | ICD-10-CM

## 2022-10-01 ENCOUNTER — Other Ambulatory Visit: Payer: Self-pay

## 2022-10-03 ENCOUNTER — Encounter: Payer: Self-pay | Admitting: Oncology

## 2022-10-08 ENCOUNTER — Encounter: Payer: Self-pay | Admitting: Oncology

## 2022-10-09 ENCOUNTER — Other Ambulatory Visit: Payer: Self-pay | Admitting: *Deleted

## 2022-10-09 DIAGNOSIS — C9110 Chronic lymphocytic leukemia of B-cell type not having achieved remission: Secondary | ICD-10-CM

## 2022-10-09 MED ORDER — LENALIDOMIDE 10 MG PO CAPS: 10.0000 mg | ORAL_CAPSULE | Freq: Every day | ORAL | 0 refills | Status: DC

## 2022-10-10 ENCOUNTER — Inpatient Hospital Stay (HOSPITAL_BASED_OUTPATIENT_CLINIC_OR_DEPARTMENT_OTHER): Payer: 59 | Admitting: Oncology

## 2022-10-10 ENCOUNTER — Inpatient Hospital Stay: Payer: 59

## 2022-10-10 ENCOUNTER — Encounter: Payer: Self-pay | Admitting: Oncology

## 2022-10-10 ENCOUNTER — Inpatient Hospital Stay: Payer: 59 | Attending: Oncology

## 2022-10-10 VITALS — BP 100/61 | HR 60

## 2022-10-10 VITALS — BP 96/47 | HR 71 | Temp 97.0°F | Resp 20 | Wt 183.9 lb

## 2022-10-10 DIAGNOSIS — Z5112 Encounter for antineoplastic immunotherapy: Secondary | ICD-10-CM | POA: Insufficient documentation

## 2022-10-10 DIAGNOSIS — Z79899 Other long term (current) drug therapy: Secondary | ICD-10-CM | POA: Diagnosis not present

## 2022-10-10 DIAGNOSIS — C9110 Chronic lymphocytic leukemia of B-cell type not having achieved remission: Secondary | ICD-10-CM | POA: Diagnosis not present

## 2022-10-10 DIAGNOSIS — R3915 Urgency of urination: Secondary | ICD-10-CM

## 2022-10-10 LAB — URINALYSIS, COMPLETE (UACMP) WITH MICROSCOPIC
Bilirubin Urine: NEGATIVE
Glucose, UA: NEGATIVE mg/dL
Hgb urine dipstick: NEGATIVE
Ketones, ur: NEGATIVE mg/dL
Leukocytes,Ua: NEGATIVE
Nitrite: NEGATIVE
Protein, ur: NEGATIVE mg/dL
Specific Gravity, Urine: 1.014 (ref 1.005–1.030)
pH: 5 (ref 5.0–8.0)

## 2022-10-10 LAB — CBC WITH DIFFERENTIAL/PLATELET
Abs Immature Granulocytes: 0.03 10*3/uL (ref 0.00–0.07)
Basophils Absolute: 0.1 10*3/uL (ref 0.0–0.1)
Basophils Relative: 2 %
Eosinophils Absolute: 0.3 10*3/uL (ref 0.0–0.5)
Eosinophils Relative: 6 %
HCT: 44.5 % (ref 36.0–46.0)
Hemoglobin: 14.8 g/dL (ref 12.0–15.0)
Immature Granulocytes: 1 %
Lymphocytes Relative: 53 %
Lymphs Abs: 3.2 10*3/uL (ref 0.7–4.0)
MCH: 34.5 pg — ABNORMAL HIGH (ref 26.0–34.0)
MCHC: 33.3 g/dL (ref 30.0–36.0)
MCV: 103.7 fL — ABNORMAL HIGH (ref 80.0–100.0)
Monocytes Absolute: 0.8 10*3/uL (ref 0.1–1.0)
Monocytes Relative: 13 %
Neutro Abs: 1.4 10*3/uL — ABNORMAL LOW (ref 1.7–7.7)
Neutrophils Relative %: 25 %
Platelets: 118 10*3/uL — ABNORMAL LOW (ref 150–400)
RBC: 4.29 MIL/uL (ref 3.87–5.11)
RDW: 13 % (ref 11.5–15.5)
Smear Review: NORMAL
WBC: 5.8 10*3/uL (ref 4.0–10.5)
nRBC: 0 % (ref 0.0–0.2)

## 2022-10-10 LAB — COMPREHENSIVE METABOLIC PANEL
ALT: 20 U/L (ref 0–44)
AST: 23 U/L (ref 15–41)
Albumin: 3.7 g/dL (ref 3.5–5.0)
Alkaline Phosphatase: 57 U/L (ref 38–126)
Anion gap: 10 (ref 5–15)
BUN: 20 mg/dL (ref 6–20)
CO2: 24 mmol/L (ref 22–32)
Calcium: 8.8 mg/dL — ABNORMAL LOW (ref 8.9–10.3)
Chloride: 107 mmol/L (ref 98–111)
Creatinine, Ser: 0.98 mg/dL (ref 0.44–1.00)
GFR, Estimated: >60 mL/min (ref >60)
Glucose, Bld: 103 mg/dL — ABNORMAL HIGH (ref 70–99)
Potassium: 4.3 mmol/L (ref 3.5–5.1)
Sodium: 141 mmol/L (ref 135–145)
Total Bilirubin: 0.6 mg/dL (ref 0.3–1.2)
Total Protein: 6.4 g/dL — ABNORMAL LOW (ref 6.5–8.1)

## 2022-10-10 MED ORDER — SODIUM CHLORIDE 0.9 % IV SOLN
20.0000 mg | Freq: Once | INTRAVENOUS | Status: AC
  Administered 2022-10-10: 20 mg via INTRAVENOUS
  Filled 2022-10-10: qty 2

## 2022-10-10 MED ORDER — SODIUM CHLORIDE 0.9 % IV SOLN
375.0000 mg/m2 | Freq: Once | INTRAVENOUS | Status: AC
  Administered 2022-10-10: 700 mg via INTRAVENOUS
  Filled 2022-10-10: qty 50

## 2022-10-10 MED ORDER — SODIUM CHLORIDE 0.9 % IV SOLN
Freq: Once | INTRAVENOUS | Status: AC
  Filled 2022-10-10: qty 250

## 2022-10-10 MED ORDER — ACETAMINOPHEN 325 MG PO TABS
650.0000 mg | ORAL_TABLET | Freq: Once | ORAL | Status: AC
  Administered 2022-10-10: 650 mg via ORAL
  Filled 2022-10-10: qty 2

## 2022-10-10 MED ORDER — DIPHENHYDRAMINE HCL 25 MG PO CAPS
50.0000 mg | ORAL_CAPSULE | Freq: Once | ORAL | Status: AC
  Administered 2022-10-10: 50 mg via ORAL
  Filled 2022-10-10: qty 2

## 2022-10-10 MED ORDER — FAMOTIDINE IN NACL 20-0.9 MG/50ML-% IV SOLN
20.0000 mg | Freq: Once | INTRAVENOUS | Status: AC
  Administered 2022-10-10: 20 mg via INTRAVENOUS
  Filled 2022-10-10: qty 50

## 2022-10-10 NOTE — Progress Notes (Signed)
Patient states over the month of January she has had the following symptoms : Stomach cramping, hot flashes,leg jumping, back pain, under arms aching, leg pain, headaches,running nose.  Patient states she get really sharp pain under left rib cage.

## 2022-10-10 NOTE — Progress Notes (Signed)
[place]  [phone] [phone]  ID: Monique [name] OB: 1971-09-14  MR#: [id number]  BZJ#:[id number]  Patient Care Team: [name], MD as PCP - General (Family Medicine) Lloyd Huger, MD as Consulting Physician (Oncology) Lane Hacker, [facility] as Pharmacist (Pharmacist) Greg Cutter, LCSW as [place] Management (Licensed Clinical Social Worker) Gloris Ham, RN as Registered Nurse (Oncology) Borders, Kirt Boys, NP as Nurse Practitioner (Hospice and Palliative Medicine)  CHIEF COMPLAINT: CLL.  INTERVAL HISTORY: Patient returns to clinic today for further evaluation and continuation of Rituxan and Revlimid.  She has mild axillary tenderness, but no swelling or pain.  She otherwise feels well.  She has a chronic peripheral neuropathy.  She is tolerating her treatments without significant side effects.  She has no other neurologic complaints.  She denies any fevers or night sweats.  She has a good appetite and denies weight loss.  She has no chest pain, shortness of breath, cough, or hemoptysis.  She denies any nausea, vomiting, constipation, or diarrhea.  She has no melena or hematochezia.  She has no urinary complaints.  Patient offers no further specific complaints today.  REVIEW OF SYSTEMS:   Review of Systems  Constitutional: Negative.  Negative for diaphoresis, fever, malaise/fatigue and weight loss.  HENT: Negative.  Negative for congestion.   Respiratory: Negative.  Negative for cough and shortness of breath.   Cardiovascular: Negative.  Negative for chest pain and leg swelling.  Gastrointestinal:  Negative for abdominal pain and constipation.  Genitourinary: Negative.  Negative for dysuria and frequency.  Musculoskeletal: Negative.  Negative for back pain, myalgias and neck pain.  Skin: Negative.  Negative for rash.  Neurological:  Positive for tingling and sensory change. Negative for focal  weakness and weakness.  Psychiatric/Behavioral: Negative.  Negative for depression and suicidal ideas. The patient is not nervous/anxious.     As per HPI. Otherwise, a complete review of systems is negative.  PAST MEDICAL HISTORY: Past Medical History:  Diagnosis Date   Anxiety    Bursitis    leg pain   Carpal tunnel syndrome    Cervical dysplasia    hx LEEP over 18 years ago.    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  (in lymph nodes)   COPD (chronic obstructive pulmonary disease) ([place])    COVID-19 2021   Depression    Eating disorder    GERD (gastroesophageal reflux disease)    History of self-harm    Insomnia    Obsession    Tobacco abuse    Vitamin B12 deficiency (non anemic)     PAST SURGICAL HISTORY: Past Surgical History:  Procedure Laterality Date   BREAST BIOPSY Right 01/05/2018   US guided biopsy of 2 areas and 1 lymph node, MIXED INFLAMMATION AND GIANT CELL REACTION   CERVICAL BIOPSY  W/ LOOP ELECTRODE EXCISION     COLONOSCOPY WITH PROPOFOL N/A 04/21/2019   Procedure: COLONOSCOPY WITH PROPOFOL;  Surgeon: Virgel Manifold, MD;  Location: [facility] ENDOSCOPY;  Service: Gastroenterology;  Laterality: N/A;   OTHER SURGICAL HISTORY     scar tissue removed from vocal cords   TUBAL LIGATION     VAGINAL HYSTERECTOMY N/A 01/22/2021   Procedure: HYSTERECTOMY VAGINAL; BILATERAL SALPINGECTOMY;  Surgeon: Rubie Maid, MD;  Location: [facility] ORS;  Service: Gynecology;  Laterality: N/A;   vocal cord surgery  2005    FAMILY HISTORY: Family History  Problem Relation Age of Onset   Depression  Mother    Cancer Mother        thyroid   Alcohol abuse Father    Alcohol abuse Brother    Depression Brother    Bipolar disorder Brother    Suicidality Brother    ADD / ADHD Son    Breast cancer Neg Hx     ADVANCED DIRECTIVES (Y/N):  N  HEALTH MAINTENANCE: Social History   Tobacco Use   Smoking status: Every Day    Packs/day: 1.00    Years: 31.00    Total  pack years: 31.00    Types: Cigarettes    Start date: 09/25/1986   Smokeless tobacco: Never  Vaping Use   Vaping Use: Former  Substance Use Topics   Alcohol use: Not Currently    Alcohol/week: 0.0 standard drinks of alcohol    Comment: rarely   Drug use: Yes    Frequency: 7.0 times per week    Types: Marijuana     Colonoscopy:  PAP:  Bone density:  Lipid panel:  No Known Allergies  Current Outpatient Medications  Medication Sig Dispense Refill   albuterol (VENTOLIN HFA) 108 (90 Base) MCG/ACT inhaler Inhale 2 puffs into the lungs every 6 (six) hours as needed for wheezing or shortness of breath. 8 g 0   ARIPiprazole (ABILIFY) 5 MG tablet Take 1 tablet (5 mg total) by mouth daily. 90 tablet 1   Ascorbic Acid (VITAMIN C) 1000 MG tablet Take 1,000 mg by mouth daily as needed (immune health).     ASPIRIN 81 PO Take 81 mg by mouth daily.     atorvastatin (LIPITOR) 10 MG tablet Take 1 tablet (10 mg total) by mouth in the morning. 90 tablet 1   benzonatate (TESSALON) 100 MG capsule Take 1 capsule (100 mg total) by mouth 2 (two) times daily as needed for cough. 20 capsule 0   Calcium Carbonate-Vit D-Min (GNP CALCIUM 1200) 1200-1000 MG-UNIT CHEW Chew 1,200 mg by mouth daily with breakfast. Take in combination with vitamin D and magnesium. (Patient taking differently: Chew 2 tablets by mouth in the morning, at noon, and at bedtime. Take in combination with vitamin D and magnesium.) 90 tablet 3   Crisaborole 2 % OINT Apply topically.     estrogens, conjugated, (PREMARIN) 0.625 MG tablet Take 1 tablet (0.625 mg total) by mouth daily. Take daily for 21 days then do not take for 7 days. 90 tablet 3   hydrocortisone cream 1 % APPLY TOPICALLY TWICE DAILY 30 g 0   lenalidomide (REVLIMID) 10 MG capsule Take 1 capsule (10 mg total) by mouth daily. 28 capsule 0   loratadine (CLARITIN) 10 MG tablet Take 1 tablet (10 mg total) by mouth every morning. 90 tablet 1   Magnesium 500 MG CAPS Take 500 mg by  mouth in the morning.     meloxicam (MOBIC) 15 MG tablet Take 1 tablet (15 mg total) by mouth daily. 90 tablet 1   mupirocin ointment (BACTROBAN) 2 % Apply 1 Application topically daily. Qd to excision site 22 g 1   omeprazole (PRILOSEC) 20 MG capsule Take 1 capsule (20 mg total) by mouth every morning. 90 capsule 3   predniSONE (DELTASONE) 5 MG tablet TAKE ONE TABLET BY MOUTH daily WITH BREAKFAST AFTER finishing THE 10 MG taper 30 tablet 2   pregabalin (LYRICA) 100 MG capsule Take 100 mg by mouth 3 (three) times daily.     Ruxolitinib Phosphate 1.5 % CREA Apply topically.     tiZANidine (ZANAFLEX)  2 MG tablet Take 1 tablet (2 mg total) by mouth at bedtime. 90 tablet 1   umeclidinium-vilanterol (ANORO ELLIPTA) 62.5-25 MCG/ACT AEPB Inhale 1 puff into the lungs daily. 60 each 5   vitamin B-12 (CYANOCOBALAMIN) 500 MCG tablet Take 250 mcg by mouth every morning.     promethazine-dextromethorphan (PROMETHAZINE-DM) 6.25-15 MG/5ML syrup Take 5 mLs by mouth 4 (four) times daily as needed for cough. 118 mL 0   No current facility-administered medications for this visit.    OBJECTIVE: Vitals:   10/10/22 0853  BP: (!) 96/47  Pulse: 71  Resp: 20  Temp: (!) 97 F (36.1 C)  SpO2: 99%      Body mass index is 28.8 kg/m.    ECOG FS:0 - Asymptomatic  General: Well-developed, well-nourished, no acute distress. Eyes: Pink conjunctiva, anicteric sclera. HEENT: Normocephalic, moist mucous membranes. Lungs: No audible wheezing or coughing. Heart: Regular rate and rhythm. Abdomen: Soft, nontender, no obvious distention. Musculoskeletal: No edema, cyanosis, or clubbing. Neuro: Alert, answering all questions appropriately. Cranial nerves grossly intact. Skin: No rashes or petechiae noted. Psych: Normal affect. Lymphatics: No palpable axillary lymphadenopathy.  LAB RESULTS:  Lab Results  Component Value Date   NA 141 10/10/2022   K 4.3 10/10/2022   CL 107 10/10/2022   CO2 24 10/10/2022   GLUCOSE  103 (H) 10/10/2022   BUN 20 10/10/2022   CREATININE 0.98 10/10/2022   CALCIUM 8.8 (L) 10/10/2022   PROT 6.4 (L) 10/10/2022   ALBUMIN 3.7 10/10/2022   AST 23 10/10/2022   ALT 20 10/10/2022   ALKPHOS 57 10/10/2022   BILITOT 0.6 10/10/2022   GFRNONAA >60 10/10/2022   GFRAA 79 12/06/2020    Lab Results  Component Value Date   WBC 5.8 10/10/2022   NEUTROABS 1.4 (L) 10/10/2022   HGB 14.8 10/10/2022   HCT 44.5 10/10/2022   MCV 103.7 (H) 10/10/2022   PLT 118 (L) 10/10/2022     STUDIES: No results found.  ONCOLOGY HISTORY:  Patient completed cycle 3 of Rituxan plus Treanda on March 26, 2018, but was then noted to have progressive disease.  She was then initiated on 480 mg Imbruvica in November 2019, but had progression of disease with increasing white blood cell count as well as CT scan on Jan 22, 2022 revealing increased lymphadenopathy consistent with progression of disease.  Attempted patient on venetoclax, but she could not tolerate it secondary to persistent diarrhea and transaminitis.   ASSESSMENT: CLL.  PLAN:    1. CLL: Confirmed by peripheral blood flow cytometry.  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  Patient recently completed 4 weekly cycles of Rituxan and now has transition to treatment every 4 weeks.  CT scan results from July 31, 2022 revealed mild progression of disease, but will continue current treatment at this time.  Patient expressed understanding that we will likely need to change treatments in the near future.  She has been instructed to continue prednisone, Claritin, and famotidine along with her 10 mg Revlimid daily.  Proceed with cycle 11 of treatment today.  Return to clinic in 4 weeks for further evaluation and consideration of cycle 12.  Will reimage after cycle 12.  Appreciate clinical pharmacy input.   2.  Leukocytosis: Resolved. 3.  Trigeminal neuralgia: Resolved.  MRI of the  brain on Jan 25, 2019 was unremarkable.   4.  Depression/anxiety: Chronic and unchanged.  Continue follow-up and  treatment per primary care. 5.  Neuropathic pain/peripheral neuropathy: Chronic and unchanged.  She reports she is now on disability.  Continue current follow-up and treatment as per neurology. 6.  Iron deficiency: Resolved.   7.  Rituxan reaction: Patient has been given additional premedications for preventative measures.  8.  Thrombocytopenia: Chronic and unchanged.  Platelet count has trended down slightly to 118.  Proceed with treatment as above.   9.  Rash: Resolved with prednisone, Claritin, and famotidine.  Continue Revlimid as above.  If patient remains on prednisone long-term, she will require bone mineral density. 10.  Axillary tenderness: Patient has been instructed to use ibuprofen sparingly.   Patient expressed understanding and was in agreement with this plan. She also understands that She can call clinic at any time with any questions, concerns, or complaints.    Lloyd Huger, MD   10/10/2022 9:31 AM

## 2022-10-10 NOTE — Patient Instructions (Signed)
Rituximab Injection What is this medication? RITUXIMAB (ri TUX i mab) treats leukemia and lymphoma. It works by blocking a protein that causes cancer cells to grow and multiply. This helps to slow or stop the spread of cancer cells. It may also be used to treat autoimmune conditions, such as arthritis. It works by slowing down an overactive immune system. It is a monoclonal antibody. This medicine may be used for other purposes; ask your health care provider or pharmacist if you have questions. COMMON BRAND NAME(S): RIABNI, Rituxan, RUXIENCE, truxima What should I tell my care team before I take this medication? They need to know if you have any of these conditions: Chest pain Heart disease Immune system problems Infection, such as chickenpox, cold sores, hepatitis B, herpes Irregular heartbeat or rhythm Kidney disease Low blood counts, such as low white cells, platelets, red cells Lung disease Recent or upcoming vaccine An unusual or allergic reaction to rituximab, other medications, foods, dyes, or preservatives Pregnant or trying to get pregnant Breast-feeding How should I use this medication? This medication is injected into a vein. It is given by a care team in a hospital or clinic setting. A special MedGuide will be given to you before each treatment. Be sure to read this information carefully each time. Talk to your care team about the use of this medication in children. While this medication may be prescribed for children as young as 6 months for selected conditions, precautions do apply. Overdosage: If you think you have taken too much of this medicine contact a poison control center or emergency room at once. NOTE: This medicine is only for you. Do not share this medicine with others. What if I miss a dose? Keep appointments for follow-up doses. It is important not to miss your dose. Call your care team if you are unable to keep an appointment. What may interact with this  medication? Do not take this medication with any of the following: Live vaccines This medication may also interact with the following: Cisplatin This list may not describe all possible interactions. Give your health care provider a list of all the medicines, herbs, non-prescription drugs, or dietary supplements you use. Also tell them if you smoke, drink alcohol, or use illegal drugs. Some items may interact with your medicine. What should I watch for while using this medication? Your condition will be monitored carefully while you are receiving this medication. You may need blood work while taking this medication. This medication can cause serious infusion reactions. To reduce the risk your care team may give you other medications to take before receiving this one. Be sure to follow the directions from your care team. This medication may increase your risk of getting an infection. Call your care team for advice if you get a fever, chills, sore throat, or other symptoms of a cold or flu. Do not treat yourself. Try to avoid being around people who are sick. Call your care team if you are around anyone with measles, chickenpox, or if you develop sores or blisters that do not heal properly. Avoid taking medications that contain aspirin, acetaminophen, ibuprofen, naproxen, or ketoprofen unless instructed by your care team. These medications may hide a fever. This medication may cause serious skin reactions. They can happen weeks to months after starting the medication. Contact your care team right away if you notice fevers or flu-like symptoms with a rash. The rash may be red or purple and then turn into blisters or peeling of the skin.   You may also notice a red rash with swelling of the face, lips, or lymph nodes in your neck or under your arms. In some patients, this medication may cause a serious brain infection that may cause death. If you have any problems seeing, thinking, speaking, walking, or  standing, tell your care team right away. If you cannot reach your care team, urgently seek another source of medical care. Talk to your care team if you may be pregnant. Serious birth defects can occur if you take this medication during pregnancy and for 12 months after the last dose. You will need a negative pregnancy test before starting this medication. Contraception is recommended while taking this medication and for 12 months after the last dose. Your care team can help you find the option that works for you. Do not breastfeed while taking this medication and for at least 6 months after the last dose. What side effects may I notice from receiving this medication? Side effects that you should report to your care team as soon as possible: Allergic reactions or angioedema--skin rash, itching or hives, swelling of the face, eyes, lips, tongue, arms, or legs, trouble swallowing or breathing Bowel blockage--stomach cramping, unable to have a bowel movement or pass gas, loss of appetite, vomiting Dizziness, loss of balance or coordination, confusion or trouble speaking Heart attack--pain or tightness in the chest, shoulders, arms, or jaw, nausea, shortness of breath, cold or clammy skin, feeling faint or lightheaded Heart rhythm changes--fast or irregular heartbeat, dizziness, feeling faint or lightheaded, chest pain, trouble breathing Infection--fever, chills, cough, sore throat, wounds that don't heal, pain or trouble when passing urine, general feeling of discomfort or being unwell Infusion reactions--chest pain, shortness of breath or trouble breathing, feeling faint or lightheaded Kidney injury--decrease in the amount of urine, swelling of the ankles, hands, or feet Liver injury--right upper belly pain, loss of appetite, nausea, light-colored stool, dark yellow or brown urine, yellowing skin or eyes, unusual weakness or fatigue Redness, blistering, peeling, or loosening of the skin, including  inside the mouth Stomach pain that is severe, does not go away, or gets worse Tumor lysis syndrome (TLS)--nausea, vomiting, diarrhea, decrease in the amount of urine, dark urine, unusual weakness or fatigue, confusion, muscle pain or cramps, fast or irregular heartbeat, joint pain Side effects that usually do not require medical attention (report to your care team if they continue or are bothersome): Headache Joint pain Nausea Runny or stuffy nose Unusual weakness or fatigue This list may not describe all possible side effects. Call your doctor for medical advice about side effects. You may report side effects to FDA at 1-800-FDA-1088. Where should I keep my medication? This medication is given in a hospital or clinic. It will not be stored at home. NOTE: This sheet is a summary. It may not cover all possible information. If you have questions about this medicine, talk to your doctor, pharmacist, or health care provider.  2023 Elsevier/Gold Standard (2022-01-15 00:00:00)  

## 2022-10-11 ENCOUNTER — Ambulatory Visit: Payer: Self-pay | Admitting: *Deleted

## 2022-10-11 ENCOUNTER — Other Ambulatory Visit: Payer: Self-pay

## 2022-10-11 LAB — URINE CULTURE: Culture: NO GROWTH

## 2022-10-11 NOTE — Telephone Encounter (Signed)
Patient aware and verbalized understanding.

## 2022-10-11 NOTE — Telephone Encounter (Signed)
Medication request- urine dip result in chart- awaiting culture results Chief Complaint: urinary frequency Symptoms: frequency, incontinence worse, odor  Frequency: 1-2 weeks Pertinent Negatives: Patient denies pain Disposition: '[]'$ ED /'[]'$ Urgent Care (no appt availability in office) / '[]'$ Appointment(In office/virtual)/ '[]'$  [facility]/ '[]'$ Home Care/ '[]'$ Refused Recommended Disposition /'[]'$  Mobile Bus/ '[x]'$  Follow-up with PCP Additional Notes: Patient is calling to see if PCP wants to send in treatment for UTI. Patient was seen at oncology and they did UA and sent culture. Since it is Friday- patient wants to know if she can be treated. Patient would like Rx sent to [facility] if possible.

## 2022-10-11 NOTE — Telephone Encounter (Signed)
Summary: frequent urination   Pt was advise by the lab results she did at the cancer center that she has a bacteria and she has been experiencing frequent urination / pt already has an appt but for Tuesday / she asked if she should wait until then / no appts open for today/please advise     Reason for Disposition . Urinating more frequently than usual (i.e., frequency) . Prescription request for new medicine (not a refill)  Answer Assessment - Initial Assessment Questions 1. SYMPTOM: "What's the main symptom you're concerned about?" (e.g., frequency, incontinence)     Frequency, coughing causes incontinent of urine 2. ONSET: "When did the  frequency  start?"     1-2 weeks 3. PAIN: "Is there any pain?" If Yes, ask: "How bad is it?" (Scale: 1-10; mild, moderate, severe)     no 4. CAUSE: "What do you think is causing the symptoms?"     Possible UTI 5. OTHER SYMPTOMS: "Do you have any other symptoms?" (e.g., blood in urine, fever, flank pain, pain with urination)     Back pain- R lower side  Answer Assessment - Initial Assessment Questions 1. NAME of MEDICINE: "What medicine(s) are you calling about?"     Request antibiotic for UTI 2. QUESTION: "What is your question?" (e.g., double dose of medicine, side effect)     Can PCP prescribe antibiotic for UTI- UA in chart and culture pending 3. PRESCRIBER: "Who prescribed the medicine?" Reason: if prescribed by specialist, call should be referred to that group.     None- new request 4. SYMPTOMS: "Do you have any symptoms?" If Yes, ask: "What symptoms are you having?"  "How bad are the symptoms (e.g., mild, moderate, severe)     urinary frequency, odor, increased stress incontinence  Protocols used: Urinary Symptoms-A-AH, Medication Question Call-A-AH

## 2022-10-14 NOTE — Progress Notes (Unsigned)
Name: Monique [name]   MRN: [id number]    DOB: 06-Oct-1970   Date:10/15/2022       Progress Note  Subjective  Chief Complaint  Follow Up  HPI  CLL: diagnosed in 2017  seeing oncologist, [name] , she is on oral Revlimid, no longer on Imbruvica she has a new rash on legs that seems secondary to medication. She has thrombocytopenia she bruises easily and has senile purpura on both arms    Low back pain and radiculitis/neuropahty : she is on Lyrica, Tizanidine and Meloxicam  , she was seeing Dr. Consuela Mimes but only going prn for injections. . Pain is no longer constant, states pain is controlled at this time   She states tingling, numbness intermittently down  her labs  she also has cramping on hands and feet intermittent  Emphysema: she is still smoking, currently 1.5 pack daily. . She has daily cough,  no wheezing but has intermittent sob -usually with moderate activity.  She is on Anoro and doing better, she does not like Advair    Atherosclerosis: she has been taking Atorvastatin and no side effects. Last LDL was up from 80 to 100. She is getting medication through [facility] and seems compliant , discussed change in diet We will recheck labs today    Carpal Tunnel: had NCS done by Dr. 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 Not working She states kids and grandkids are around her house daily   GERD: well controlled with PPI  Urinary urgency: she states symptoms or urgency started about one month ago no dysuria, she saw [name] and negative urine culture. She states now urine is concentrated and has an odor. 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/09/2020   Bilateral leg pain 11/03/2019   Iron deficiency anemia 10/15/2019   Bilateral arm pain 08/05/2019   Bilateral hand numbness 08/05/2019   Weakness of both hands 08/05/2019   Chronic musculoskeletal pain 07/19/2019   Chronic neuropathic pain 07/19/2019   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   Special screening for malignant neoplasms, colon    Polyp of sigmoid colon    Hemorrhoids    Trigeminal neuralgia 10/05/2018   Lumbar radiculitis (Right) 09/24/2018   Hypocalcemia 06/03/2018   Vitamin D insufficiency 06/03/2018   Abnormal MRI, lumbar spine (06/02/2020) 06/03/2018   Chronic hip pain (Right) 06/03/2018   Spondylosis without myelopathy or radiculopathy, lumbar region 06/03/2018   DDD (degenerative disc disease), lumbar 06/02/2018   Lumbar facet hypertrophy 06/02/2018   Lumbar facet arthropathy 06/02/2018   Lumbar facet syndrome (Bilateral) (R>L) 06/02/2018   Marijuana use 05/19/2018   Chronic lower extremity pain (1ry area of Pain) (Bilateral) (R>L) 05/13/2018   Chronic low back pain (2ry area of Pain) (Bilateral) (R>L) w/ sciatica (Bilateral) 05/13/2018   Chronic pain syndrome 05/13/2018   Disorder of skeletal  system 05/13/2018   Pharmacologic therapy 05/13/2018   Problems influencing health status 05/13/2018   Mastalgia 05/05/2018   Breast mass, right 01/07/2018   Face pain 12/11/2017   Tingling 12/11/2017   Thoracic aortic atherosclerosis ([place]) 08/20/2017   Emphysema of lung ([place]) 08/20/2017   Adductor tendinitis 04/23/2017   Trochanteric bursitis of right hip 04/23/2017   GERD without esophagitis 12/11/2016    CLL (chronic lymphocytic leukemia) ([place]) 11/24/2016   Stress incontinence 09/04/2016   B12 deficiency 07/10/2015   Insomnia 07/10/2015   Anorexia nervosa, restricting type 07/10/2015   Anxiety, generalized 07/10/2015   H/O suicide attempt 07/10/2015   Lymphocytosis 07/10/2015   Obsessive-compulsive disorder 07/10/2015   Tobacco use 07/10/2015   History of cervical dysplasia 07/18/2014    Past Surgical History:  Procedure Laterality Date   BREAST BIOPSY Right 01/05/2018   US guided biopsy of 2 areas and 1 lymph node, MIXED INFLAMMATION AND GIANT CELL REACTION   CERVICAL BIOPSY  W/ LOOP ELECTRODE EXCISION     COLONOSCOPY WITH PROPOFOL N/A 04/21/2019   Procedure: COLONOSCOPY WITH PROPOFOL;  Surgeon: Virgel Manifold, MD;  Location: [facility] ENDOSCOPY;  Service: Gastroenterology;  Laterality: N/A;   OTHER SURGICAL HISTORY     scar tissue removed from vocal cords   TUBAL LIGATION     VAGINAL HYSTERECTOMY N/A 01/22/2021   Procedure: HYSTERECTOMY VAGINAL; BILATERAL SALPINGECTOMY;  Surgeon: Rubie Maid, MD;  Location: [facility] ORS;  Service: Gynecology;  Laterality: N/A;   vocal cord surgery  2005    Family History  Problem Relation Age of Onset   Depression Mother    Cancer Mother        thyroid   Alcohol abuse Father    Alcohol abuse Brother    Depression Brother    Bipolar disorder Brother    Suicidality Brother    ADD / ADHD Son    Breast cancer Neg Hx     Social History   Tobacco Use   Smoking status: Every Day    Packs/day: 1.00    Years: 31.00    Total pack years: 31.00    Types: Cigarettes    Start date: 09/25/1986   Smokeless tobacco: Never  Substance Use Topics   Alcohol use: Not Currently    Alcohol/week: 0.0 standard drinks of alcohol    Comment: rarely     Current Outpatient Medications:    albuterol (VENTOLIN HFA) 108 (90 Base) MCG/ACT inhaler, Inhale 2 puffs into the lungs every 6 (six) hours as needed for wheezing or shortness of breath., Disp: 8 g, Rfl:  0   ARIPiprazole (ABILIFY) 5 MG tablet, Take 1 tablet (5 mg total) by mouth daily., Disp: 90 tablet, Rfl: 1   Ascorbic Acid (VITAMIN C) 1000 MG tablet, Take 1,000 mg by mouth daily as needed (immune health)., Disp: , Rfl:    ASPIRIN 81 PO, Take 81 mg by mouth daily., Disp: , Rfl:    atorvastatin (LIPITOR) 10 MG tablet, Take 1 tablet (10 mg total) by mouth in the morning., Disp: 90 tablet, Rfl: 1   benzonatate (TESSALON) 100 MG capsule, Take 1 capsule (100 mg total) by mouth 2 (two) times daily as needed for cough., Disp: 20 capsule, Rfl: 0   estrogens, conjugated, (PREMARIN) 0.625 MG tablet, Take 1 tablet (0.625 mg total) by mouth daily. Take daily for 21 days then do not take for 7 days., Disp: 90 tablet, Rfl: 3   lenalidomide (REVLIMID) 10 MG capsule, Take 1 capsule (10 mg total) by  mouth daily., Disp: 28 capsule, Rfl: 0   loratadine (CLARITIN) 10 MG tablet, Take 1 tablet (10 mg total) by mouth every morning., Disp: 90 tablet, Rfl: 1   Magnesium 500 MG CAPS, Take 500 mg by mouth in the morning., Disp: , Rfl:    meloxicam (MOBIC) 15 MG tablet, Take 1 tablet (15 mg total) by mouth daily., Disp: 90 tablet, Rfl: 1   omeprazole (PRILOSEC) 20 MG capsule, Take 1 capsule (20 mg total) by mouth every morning., Disp: 90 capsule, Rfl: 3   predniSONE (DELTASONE) 5 MG tablet, TAKE ONE TABLET BY MOUTH daily WITH BREAKFAST AFTER finishing THE 10 MG taper, Disp: 30 tablet, Rfl: 2   pregabalin (LYRICA) 100 MG capsule, Take 100 mg by mouth 3 (three) times daily., Disp: , Rfl:    tiZANidine (ZANAFLEX) 2 MG tablet, Take 1 tablet (2 mg total) by mouth at bedtime., Disp: 90 tablet, Rfl: 1   umeclidinium-vilanterol (ANORO ELLIPTA) 62.5-25 MCG/ACT AEPB, Inhale 1 puff into the lungs daily., Disp: 60 each, Rfl: 5   vitamin B-12 (CYANOCOBALAMIN) 500 MCG tablet, Take 250 mcg by mouth every morning., Disp: , Rfl:    Calcium Carbonate-Vit D-Min (GNP CALCIUM 1200) 1200-1000 MG-UNIT CHEW, Chew 1,200 mg by mouth daily with  breakfast. Take in combination with vitamin D and magnesium., Disp: 90 tablet, Rfl: 3   Crisaborole 2 % OINT, Apply topically. (Patient not taking: Reported on 10/15/2022), Disp: , Rfl:    hydrocortisone cream 1 %, APPLY TOPICALLY TWICE DAILY (Patient not taking: Reported on 10/15/2022), Disp: 30 g, Rfl: 0   mupirocin ointment (BACTROBAN) 2 %, Apply 1 Application topically daily. Qd to excision site (Patient not taking: Reported on 10/15/2022), Disp: 22 g, Rfl: 1   Ruxolitinib Phosphate 1.5 % CREA, Apply topically. (Patient not taking: Reported on 10/15/2022), Disp: , Rfl:   No Known Allergies  I personally reviewed active problem list, medication list, allergies, family history, social history, health maintenance with the patient/caregiver today.   ROS  Constitutional: Negative for fever or weight change.  Respiratory: Negative for cough and shortness of breath.   Cardiovascular: Negative for chest pain or palpitations.  Gastrointestinal: Negative for abdominal pain, no bowel changes.  Musculoskeletal: Negative for gait problem or joint swelling.  Skin: Negative for rash.  Neurological: Negative for dizziness or headache.  No other specific complaints in a complete review of systems (except as listed in HPI above).   Objective  Vitals:   10/15/22 0846  BP: 104/64  Pulse: 77  Resp: 16  SpO2: 99%  Weight: 184 lb (83.5 kg)  Height: '5\' 7"'$  (1.702 m)    Body mass index is 28.82 kg/m.  Physical Exam  Constitutional: Patient appears well-developed and well-nourished.  No distress.  HEENT: head atraumatic, normocephalic, pupils equal and reactive to light, neck supple Cardiovascular: Normal rate, regular rhythm and normal heart sounds.  No murmur heard. No BLE edema. Pulmonary/Chest: Effort normal and breath sounds normal. No respiratory distress. Abdominal: Soft.  There is no tenderness. Psychiatric: Patient has a normal mood and affect. behavior is normal. Judgment and thought  content normal.    PHQ2/9:    10/15/2022    8:45 AM 08/13/2022    1:38 PM 04/15/2022    8:50 AM 03/08/2022   10:35 AM 01/01/2022   10:28 AM  Depression screen PHQ 2/9  Decreased Interest 0 0 0 0 0  Down, Depressed, Hopeless 0 0 0 0 0  PHQ - 2 Score 0 0 0 0 0  Altered sleeping 0  0    Tired, decreased energy 0  3    Change in appetite 0  0    Feeling bad or failure about yourself  0  0    Trouble concentrating 0  0    Moving slowly or fidgety/restless 0  0    Suicidal thoughts 0  0    PHQ-9 Score 0  3      phq 9 is negative   Fall Risk:    10/15/2022    8:45 AM 08/13/2022    1:37 PM 04/15/2022    8:50 AM 03/08/2022   10:35 AM 01/01/2022   10:28 AM  Fall Risk   Falls in the past year? 0 0 1 0 1  Number falls in past yr: 0 0 0 0 1  Injury with Fall? 0 0 1 0 1  Risk for fall due to : No Fall Risks  No Fall Risks  History of fall(s)  Follow up Falls prevention discussed Falls evaluation completed Falls prevention discussed Falls evaluation completed Falls evaluation completed      Functional Status Survey: Is the patient deaf or have difficulty hearing?: No Does the patient have difficulty seeing, even when wearing glasses/contacts?: No Does the patient have difficulty concentrating, remembering, or making decisions?: No Does the patient have difficulty walking or climbing stairs?: Yes Does the patient have difficulty dressing or bathing?: No Does the patient have difficulty doing errands alone such as visiting a doctor's office or shopping?: No    Assessment & Plan  1. Centrilobular emphysema ([place])  - umeclidinium-vilanterol (ANORO ELLIPTA) 62.5-25 MCG/ACT AEPB; Inhale 1 puff into the lungs daily.  Dispense: 60 each; Refill: 5  2. Thoracic aortic atherosclerosis ([facility])  - Lipid Profile - atorvastatin (LIPITOR) 10 MG tablet; Take 1 tablet (10 mg total) by mouth in the morning.  Dispense: 90 tablet; Refill: 1  3. Thrombocytopenia ([place])  Reviewed   4. Major  depression in remission ([facility])  - ARIPiprazole (ABILIFY) 5 MG tablet; Take 1 tablet (5 mg total) by mouth daily.  Dispense: 90 tablet; Refill: 1  5. CLL (chronic lymphocytic leukemia) ([facility])  Under the care of Dr. Molly Maduro  6. Senile purpura ([place])  Reassurance given   7. Chronic neuropathic pain  On lyrica   8. B12 deficiency  Continue supplementation   9. Chronic musculoskeletal pain  - tiZANidine (ZANAFLEX) 2 MG tablet; Take 1 tablet (2 mg total) by mouth at bedtime.  Dispense: 90 tablet; Refill: 1  10. Vitamin D deficiency   11. Chronic low back pain (Secondary Area of Pain) (Bilateral) (R>L) w/ sciatica (Bilateral)  - tiZANidine (ZANAFLEX) 2 MG tablet; Take 1 tablet (2 mg total) by mouth at bedtime.  Dispense: 90 tablet; Refill: 1  12. Vaginal odor  - Cervicovaginal ancillary only  13. 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 GERD without esophagitis  - omeprazole (PRILOSEC) 20 MG capsule; Take 1 capsule (20 mg total) by mouth every morning.  Dispense: 90 capsule; Refill: 1  15. Dyslipidemia  - Lipid Profile  16. Breast cancer screening by mammogram  - MM 3D SCREEN BREAST BILATERAL; Future  17. Routine screening for STI (sexually transmitted infection)  - Cervicovaginal ancillary only - RPR - HIV Antibody (routine testing w rflx)  18. Pruritic dermatitis  - loratadine (CLARITIN) 10 MG tablet; Take 1 tablet (10 mg total) by mouth every morning.  Dispense: 90 tablet; Refill: 1  19. Anorexia nervosa, restricting type  Doing well  20. Tobacco use   21. Urinary frequency  - CULTURE, URINE COMPREHENSIVE   22. Needs flu shot  - Flu Vaccine QUAD 6+ mos PF IM (Fluarix Quad PF)  23. Need for shingles vaccine  - Varicella-zoster vaccine IM

## 2022-10-15 ENCOUNTER — Other Ambulatory Visit (HOSPITAL_COMMUNITY)
Admission: RE | Admit: 2022-10-15 | Discharge: 2022-10-15 | Disposition: A | Discharge status: Home / Self Care | Payer: 59 | Source: Ambulatory Visit | Attending: Family Medicine | Admitting: Family Medicine

## 2022-10-15 ENCOUNTER — Ambulatory Visit (INDEPENDENT_AMBULATORY_CARE_PROVIDER_SITE_OTHER): Payer: 59 | Admitting: Family Medicine

## 2022-10-15 ENCOUNTER — Encounter: Payer: Self-pay | Admitting: Family Medicine

## 2022-10-15 VITALS — BP 104/64 | HR 77 | Resp 16 | Ht 67.0 in | Wt 184.0 lb

## 2022-10-15 DIAGNOSIS — F5001 Anorexia nervosa, restricting type: Secondary | ICD-10-CM

## 2022-10-15 DIAGNOSIS — Z113 Encounter for screening for infections with a predominantly sexual mode of transmission: Secondary | ICD-10-CM | POA: Insufficient documentation

## 2022-10-15 DIAGNOSIS — M5442 Lumbago with sciatica, left side: Secondary | ICD-10-CM | POA: Diagnosis not present

## 2022-10-15 DIAGNOSIS — M7918 Myalgia, other site: Secondary | ICD-10-CM | POA: Diagnosis not present

## 2022-10-15 DIAGNOSIS — N898 Other specified noninflammatory disorders of vagina: Secondary | ICD-10-CM | POA: Diagnosis present

## 2022-10-15 DIAGNOSIS — D696 Thrombocytopenia, unspecified: Secondary | ICD-10-CM

## 2022-10-15 DIAGNOSIS — E538 Deficiency of other specified B group vitamins: Secondary | ICD-10-CM | POA: Diagnosis not present

## 2022-10-15 DIAGNOSIS — E559 Vitamin D deficiency, unspecified: Secondary | ICD-10-CM | POA: Diagnosis not present

## 2022-10-15 DIAGNOSIS — L308 Other specified dermatitis: Secondary | ICD-10-CM

## 2022-10-15 DIAGNOSIS — R350 Frequency of micturition: Secondary | ICD-10-CM | POA: Diagnosis not present

## 2022-10-15 DIAGNOSIS — Z23 Encounter for immunization: Secondary | ICD-10-CM

## 2022-10-15 DIAGNOSIS — K219 Gastro-esophageal reflux disease without esophagitis: Secondary | ICD-10-CM

## 2022-10-15 DIAGNOSIS — E785 Hyperlipidemia, unspecified: Secondary | ICD-10-CM | POA: Diagnosis not present

## 2022-10-15 DIAGNOSIS — J432 Centrilobular emphysema: Secondary | ICD-10-CM | POA: Diagnosis not present

## 2022-10-15 DIAGNOSIS — I700 Atherosclerosis of aorta: Secondary | ICD-10-CM

## 2022-10-15 DIAGNOSIS — D692 Other nonthrombocytopenic purpura: Secondary | ICD-10-CM

## 2022-10-15 DIAGNOSIS — M5441 Lumbago with sciatica, right side: Secondary | ICD-10-CM

## 2022-10-15 DIAGNOSIS — F50019 Anorexia nervosa, restricting type, unspecified: Secondary | ICD-10-CM

## 2022-10-15 DIAGNOSIS — G8929 Other chronic pain: Secondary | ICD-10-CM

## 2022-10-15 DIAGNOSIS — G608 Other hereditary and idiopathic neuropathies: Secondary | ICD-10-CM

## 2022-10-15 DIAGNOSIS — M792 Neuralgia and neuritis, unspecified: Secondary | ICD-10-CM | POA: Diagnosis not present

## 2022-10-15 DIAGNOSIS — C9110 Chronic lymphocytic leukemia of B-cell type not having achieved remission: Secondary | ICD-10-CM

## 2022-10-15 DIAGNOSIS — Z720 Tobacco use: Secondary | ICD-10-CM

## 2022-10-15 DIAGNOSIS — F325 Major depressive disorder, single episode, in full remission: Secondary | ICD-10-CM

## 2022-10-15 DIAGNOSIS — Z1231 Encounter for screening mammogram for malignant neoplasm of breast: Secondary | ICD-10-CM

## 2022-10-15 MED ORDER — TIZANIDINE HCL 2 MG PO TABS: 2.0000 mg | ORAL_TABLET | Freq: Every evening | ORAL | 1 refills | Status: DC

## 2022-10-15 MED ORDER — ANORO ELLIPTA 62.5-25 MCG/ACT IN AEPB: 1.0000 | INHALATION_SPRAY | Freq: Every day | RESPIRATORY_TRACT | 5 refills | Status: DC

## 2022-10-15 MED ORDER — ATORVASTATIN CALCIUM 10 MG PO TABS: 10.0000 mg | ORAL_TABLET | Freq: Every morning | ORAL | 1 refills | Status: DC

## 2022-10-15 MED ORDER — OMEPRAZOLE 20 MG PO CPDR: 20.0000 mg | DELAYED_RELEASE_CAPSULE | Freq: Every morning | ORAL | 1 refills | Status: DC

## 2022-10-15 MED ORDER — LORATADINE 10 MG PO TABS: 10.0000 mg | ORAL_TABLET | Freq: Every morning | ORAL | 1 refills | Status: DC

## 2022-10-15 MED ORDER — ARIPIPRAZOLE 5 MG PO TABS: 5.0000 mg | ORAL_TABLET | Freq: Every day | ORAL | 1 refills | Status: DC

## 2022-10-16 ENCOUNTER — Other Ambulatory Visit: Payer: Self-pay

## 2022-10-16 LAB — CERVICOVAGINAL ANCILLARY ONLY
Bacterial Vaginitis (gardnerella): NEGATIVE
Candida Glabrata: NEGATIVE
Candida Vaginitis: NEGATIVE
Chlamydia: NEGATIVE
Comment: NEGATIVE
Comment: NEGATIVE
Comment: NEGATIVE
Comment: NEGATIVE
Comment: NEGATIVE
Comment: NORMAL
Neisseria Gonorrhea: NEGATIVE
Trichomonas: NEGATIVE

## 2022-10-17 LAB — CULTURE, URINE COMPREHENSIVE
MICRO NUMBER:: 14493626
SPECIMEN QUALITY:: ADEQUATE

## 2022-10-17 LAB — LIPID PANEL
Cholesterol: 130 mg/dL (ref <200)
HDL: 61 mg/dL (ref >=50)
LDL Cholesterol (Calc): 53 mg/dL (calc)
Non-HDL Cholesterol (Calc): 69 mg/dL (calc) (ref <130)
Total CHOL/HDL Ratio: 2.1 (calc) (ref <5.0)
Triglycerides: 79 mg/dL (ref <150)

## 2022-10-17 LAB — HIV ANTIBODY (ROUTINE TESTING W REFLEX): HIV 1&2 Ab, 4th Generation: NONREACTIVE

## 2022-10-17 LAB — RPR: RPR Ser Ql: NONREACTIVE

## 2022-10-18 ENCOUNTER — Telehealth: Payer: Self-pay | Admitting: Family Medicine

## 2022-10-18 NOTE — Telephone Encounter (Signed)
Returned call and left message relaying advice on urinary health per Dr. Ancil Boozer.

## 2022-10-18 NOTE — Telephone Encounter (Unsigned)
Copied from [place] [id number]. Topic: General - Inquiry >> Oct 18, 2022  9:47 AM Penni Bombard wrote: Reason for CRM: pt called asking about her results from her uc and labs.  She wants to know if there is anything she needs do do right now.  Was told to abstain from caffeine.  CBE  [phone]

## 2022-10-22 ENCOUNTER — Other Ambulatory Visit: Payer: Self-pay | Admitting: Family Medicine

## 2022-10-22 DIAGNOSIS — G8929 Other chronic pain: Secondary | ICD-10-CM

## 2022-11-04 ENCOUNTER — Other Ambulatory Visit: Payer: Self-pay | Admitting: *Deleted

## 2022-11-04 DIAGNOSIS — C9110 Chronic lymphocytic leukemia of B-cell type not having achieved remission: Secondary | ICD-10-CM

## 2022-11-04 MED ORDER — LENALIDOMIDE 10 MG PO CAPS: 10.0000 mg | ORAL_CAPSULE | Freq: Every day | ORAL | 0 refills | Status: DC

## 2022-11-05 ENCOUNTER — Ambulatory Visit (INDEPENDENT_AMBULATORY_CARE_PROVIDER_SITE_OTHER): Payer: 59 | Admitting: Family Medicine

## 2022-11-05 ENCOUNTER — Encounter: Payer: Self-pay | Admitting: Family Medicine

## 2022-11-05 VITALS — BP 104/70 | HR 86 | Temp 97.9°F | Resp 16 | Ht 67.0 in | Wt 184.3 lb

## 2022-11-05 DIAGNOSIS — J069 Acute upper respiratory infection, unspecified: Secondary | ICD-10-CM

## 2022-11-05 DIAGNOSIS — J432 Centrilobular emphysema: Secondary | ICD-10-CM

## 2022-11-05 DIAGNOSIS — J029 Acute pharyngitis, unspecified: Secondary | ICD-10-CM | POA: Diagnosis not present

## 2022-11-05 DIAGNOSIS — R051 Acute cough: Secondary | ICD-10-CM

## 2022-11-05 DIAGNOSIS — Z20818 Contact with and (suspected) exposure to other bacterial communicable diseases: Secondary | ICD-10-CM

## 2022-11-05 DIAGNOSIS — D849 Immunodeficiency, unspecified: Secondary | ICD-10-CM

## 2022-11-05 DIAGNOSIS — C9110 Chronic lymphocytic leukemia of B-cell type not having achieved remission: Secondary | ICD-10-CM

## 2022-11-05 MED ORDER — PROMETHAZINE-DM 6.25-15 MG/5ML PO SYRP: 2.5000 mL | ORAL_SOLUTION | Freq: Four times a day (QID) | ORAL | 0 refills | Status: DC | PRN

## 2022-11-05 NOTE — Progress Notes (Signed)
Patient ID: Roshini Bruegger, female    DOB: 03/02/1971, 52 y.o.   MRN: [id number]  PCP: [name], MD  Chief Complaint  Patient presents with   Cough    Onset for 2 weeks dry cough   Sore Throat    Subjective:   Oneita Zehnder is a 52 y.o. female, presents to clinic with CC of the following:  HPI  Presents for 2 weeks of cough, sore throat, nasal/sinus congestion/discharge, pressure and post-nasal drip. 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 Throat feels sore from postnasal drip, coughing and with swallowing, she has enlarged lymphnodes in chest/neck with CLL she has only noted some tenderness on the right side but nothing enlarged She is on claritin daily and nose is still running She has tried mucinex and tessalon perles but not improvement in cough which is worse at night, some fits, mostly dry, no CP, SOB, wheeze, she hasn't tried her albuterol inhaler Was watching her grandkids recently who had strep throat      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 06/05/2021   Lumbosacral radiculopathy/radiculitis at L5 (Bilateral) 06/05/2021   PAD (peripheral artery disease) ([place]) 02/14/2021   CIN II (cervical intraepithelial neoplasia II) 02/01/2021   S/P vaginal hysterectomy 01/22/2021   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/09/2020   Bilateral leg pain 11/03/2019   Iron deficiency anemia 10/15/2019   Bilateral arm pain 08/05/2019   Bilateral hand numbness 08/05/2019   Weakness of both hands 08/05/2019   Chronic musculoskeletal pain 07/19/2019   Chronic neuropathic pain 07/19/2019   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    Special screening for malignant neoplasms, colon    Polyp of sigmoid colon    Hemorrhoids    Trigeminal neuralgia 10/05/2018   Lumbar radiculitis (Right) 09/24/2018   Hypocalcemia 06/03/2018   Vitamin D insufficiency 06/03/2018   Abnormal MRI, lumbar spine (06/02/2020) 06/03/2018   Chronic hip pain (Right) 06/03/2018   Spondylosis without myelopathy or radiculopathy, lumbar region 06/03/2018   DDD (degenerative disc disease), lumbar 06/02/2018   Lumbar facet hypertrophy 06/02/2018   Lumbar facet arthropathy 06/02/2018   Lumbar facet syndrome (Bilateral) (R>L) 06/02/2018   Marijuana use 05/19/2018   Chronic lower extremity pain (1ry area of Pain) (Bilateral) (R>L) 05/13/2018   Chronic low back pain (2ry area of Pain) (Bilateral) (R>L) w/ sciatica (Bilateral) 05/13/2018   Chronic pain syndrome 05/13/2018   Disorder of skeletal system 05/13/2018   Pharmacologic therapy 05/13/2018   Problems influencing health status 05/13/2018   Mastalgia 05/05/2018   Breast mass, right 01/07/2018   Face pain 12/11/2017   Tingling 12/11/2017   Thoracic aortic atherosclerosis ([place]) 08/20/2017   Emphysema of lung ([place]) 08/20/2017   Adductor tendinitis 04/23/2017   Trochanteric bursitis of right hip 04/23/2017   GERD without esophagitis 12/11/2016   CLL (chronic lymphocytic leukemia) ([place]) 11/24/2016   Stress incontinence 09/04/2016   B12 deficiency 07/10/2015   Insomnia 07/10/2015   Anorexia nervosa, restricting type 07/10/2015   Anxiety, generalized 07/10/2015   H/O suicide attempt 07/10/2015   Lymphocytosis 07/10/2015   Obsessive-compulsive disorder 07/10/2015   Tobacco use 07/10/2015   History of cervical dysplasia 07/18/2014  Current Outpatient Medications:    albuterol (VENTOLIN HFA) 108 (90 Base) MCG/ACT inhaler, Inhale 2 puffs into the lungs every 6 (six) hours as needed for wheezing or shortness of breath., Disp: 8 g, Rfl: 0   ARIPiprazole (ABILIFY) 5 MG tablet, Take 1 tablet (5  mg total) by mouth daily., Disp: 90 tablet, Rfl: 1   Ascorbic Acid (VITAMIN C) 1000 MG tablet, Take 1,000 mg by mouth daily as needed (immune health)., Disp: , Rfl:    ASPIRIN 81 PO, Take 81 mg by mouth daily., Disp: , Rfl:    atorvastatin (LIPITOR) 10 MG tablet, Take 1 tablet (10 mg total) by mouth in the morning., Disp: 90 tablet, Rfl: 1   estrogens, conjugated, (PREMARIN) 0.625 MG tablet, Take 1 tablet (0.625 mg total) by mouth daily. Take daily for 21 days then do not take for 7 days., Disp: 90 tablet, Rfl: 3   lenalidomide (REVLIMID) 10 MG capsule, Take 1 capsule (10 mg total) by mouth daily., Disp: 28 capsule, Rfl: 0   loratadine (CLARITIN) 10 MG tablet, Take 1 tablet (10 mg total) by mouth every morning., Disp: 90 tablet, Rfl: 1   Magnesium 500 MG CAPS, Take 500 mg by mouth in the morning., Disp: , Rfl:    meloxicam (MOBIC) 15 MG tablet, TAKE ONE TABLET BY MOUTH EVERY MORNING, Disp: 90 tablet, Rfl: 1   omeprazole (PRILOSEC) 20 MG capsule, Take 1 capsule (20 mg total) by mouth every morning., Disp: 90 capsule, Rfl: 1   predniSONE (DELTASONE) 5 MG tablet, TAKE ONE TABLET BY MOUTH daily WITH BREAKFAST AFTER finishing THE 10 MG taper, Disp: 30 tablet, Rfl: 2   pregabalin (LYRICA) 100 MG capsule, Take 100 mg by mouth 3 (three) times daily., Disp: , Rfl:    tiZANidine (ZANAFLEX) 2 MG tablet, Take 1 tablet (2 mg total) by mouth at bedtime., Disp: 90 tablet, Rfl: 1   umeclidinium-vilanterol (ANORO ELLIPTA) 62.5-25 MCG/ACT AEPB, Inhale 1 puff into the lungs daily., Disp: 60 each, Rfl: 5   vitamin B-12 (CYANOCOBALAMIN) 500 MCG tablet, Take 250 mcg by mouth every morning., Disp: , Rfl:    Calcium Carbonate-Vit D-Min (GNP CALCIUM 1200) 1200-1000 MG-UNIT CHEW, Chew 1,200 mg by mouth daily with breakfast. Take in combination with vitamin D and magnesium., Disp: 90 tablet, Rfl: 3   No Known Allergies   Social History   Tobacco Use   Smoking status: Every Day    Packs/day: 1.00    Years: 31.00     Total pack years: 31.00    Types: Cigarettes    Start date: 09/25/1986   Smokeless tobacco: Never  Vaping Use   Vaping Use: Former  Substance Use Topics   Alcohol use: Not Currently    Alcohol/week: 0.0 standard drinks of alcohol    Comment: rarely   Drug use: Yes    Frequency: 7.0 times per week    Types: Marijuana      Chart Review Today: I personally reviewed active problem list, medication list, allergies, family history, social history, health maintenance, notes from last encounter, lab results, imaging with the patient/caregiver today.   Review of Systems  Constitutional: Negative.   HENT: Negative.    Eyes: Negative.   Respiratory: Negative.    Cardiovascular: Negative.   Gastrointestinal: Negative.   Endocrine: Negative.   Genitourinary: Negative.   Musculoskeletal: Negative.   Skin: Negative.   Allergic/Immunologic: Negative.   Neurological: Negative.   Hematological: Negative.   Psychiatric/Behavioral: Negative.    All other systems  reviewed and are negative.      Objective:   Vitals:   11/05/22 1140  BP: 104/70  Pulse: 86  Resp: 16  Temp: 97.9 F (36.6 C)  TempSrc: Oral  SpO2: 98%  Weight: 184 lb 4.8 oz (83.6 kg)  Height: 5' 7"$  (1.702 m)    Body mass index is 28.87 kg/m.  Physical Exam Vitals and nursing note reviewed.  Constitutional:      General: She is not in acute distress.    Appearance: Normal appearance. She is well-developed and well-groomed. She is not ill-appearing, toxic-appearing or diaphoretic.  HENT:     Head: Normocephalic and atraumatic.     Right Ear: Hearing, tympanic membrane, ear canal and external ear normal.     Left Ear: Hearing, tympanic membrane, ear canal and external ear normal.     Nose: Mucosal edema, congestion and rhinorrhea present. Rhinorrhea is clear.     Right Nostril: No epistaxis.     Left Nostril: No epistaxis.     Right Turbinates: Swollen.     Left Turbinates: Swollen.     Right Sinus: No  maxillary sinus tenderness or frontal sinus tenderness.     Left Sinus: No maxillary sinus tenderness or frontal sinus tenderness.     Mouth/Throat:     Mouth: Mucous membranes are moist.     Pharynx: Oropharynx is clear. Uvula midline. Posterior oropharyngeal erythema (mild) present. No pharyngeal swelling, oropharyngeal exudate or uvula swelling.     Tonsils: No tonsillar exudate.  Eyes:     General:        Right eye: No discharge.        Left eye: No discharge.     Conjunctiva/sclera: Conjunctivae normal.  Neck:     Trachea: No tracheal deviation.  Cardiovascular:     Rate and Rhythm: Normal rate and regular rhythm.     Pulses: Normal pulses.     Heart sounds: Normal heart sounds.  Pulmonary:     Effort: Pulmonary effort is normal. No tachypnea, accessory muscle usage, respiratory distress or retractions.     Breath sounds: Normal breath sounds. No stridor, decreased air movement or transmitted upper airway sounds. No decreased breath sounds, wheezing, rhonchi or rales.     Comments: Able to speak in full and complete sentences Only 1-2 coughing episodes during visit - wheezy dry sounding cough though no wheeze A&P with auscultation Musculoskeletal:        General: Normal range of motion.  Lymphadenopathy:     Head:     Right side of head: Submandibular adenopathy present.     Left side of head: Submandibular adenopathy present.     Cervical: Cervical adenopathy present.     Comments: Palpable enlarged lymphnodes bilaterally that were not noted as tender to pt  Skin:    General: Skin is warm and dry.     Findings: No rash.  Neurological:     Mental Status: She is alert.     Motor: No abnormal muscle tone.     Coordination: Coordination normal.  Psychiatric:        Behavior: Behavior normal. Behavior is cooperative.      Results for orders placed or performed in visit on 10/15/22  CULTURE, URINE COMPREHENSIVE  Result Value Ref Range   MICRO NUMBER: [id number]    SPECIMEN  QUALITY: Adequate    Source OTHER (SPECIFY)    STATUS: FINAL    ISOLATE 1: Gram positive cocci isolated (A)   Lipid Profile  Result Value Ref Range   Cholesterol 130 <200 mg/dL   HDL 61 > OR = 50 mg/dL   Triglycerides 79 <150 mg/dL   LDL Cholesterol (Calc) 53 mg/dL (calc)   Total CHOL/HDL Ratio 2.1 <5.0 (calc)   Non-HDL Cholesterol (Calc) 69 <130 mg/dL (calc)  RPR  Result Value Ref Range   RPR Ser Ql NON-REACTIVE NON-REACTIVE  HIV Antibody (routine testing w rflx)  Result Value Ref Range   HIV 1&2 Ab, 4th Generation NON-REACTIVE NON-REACTIVE  Cervicovaginal ancillary only  Result Value Ref Range   Neisseria Gonorrhea Negative    Chlamydia Negative    Trichomonas Negative    Bacterial Vaginitis (gardnerella) Negative    Candida Vaginitis Negative    Candida Glabrata Negative    Comment Normal Reference Range Candida Species - Negative    Comment Normal Reference Range Candida Galbrata - Negative    Comment Normal Reference Range Trichomonas - Negative    Comment Normal Reference [place] Chlamydia - Negative    Comment      Normal Reference Range Neisseria Gonorrhea - Negative   Comment      Normal Reference Range Bacterial Vaginosis - Negative   *Note: Due to a large number of results and/or encounters for the requested time period, some results have not been displayed. A complete set of results can be found in Results Review.       Assessment & Plan:   Pt is 52 y/o female presents for about 2 weeks of nasal/uri sx with sore throat and dry cough, hx of emphysema, CLL on tx/immunocompromised with recent strep exposure  She is overall well appearing PE is consistent with viral URI - some mild coughing/bronchospasm with lungs CTA A&P, no increased work of breathing Send out tests done today - encouraged to increase antihistamine (add morning dose) can try decongestant prn, recommend cough meds - mucinex and using inhaler, propping up head of bead  Close f/up if any worsening   Viral illness may soon clear If strep is positive we will tx No concern on exam today for bacterial sinusitis or pneumonia - no indication right now for abx coverage but with her med hx and already 2 weeks of sx if not improving I would have low threshold to cover with Abx or recheck pt/get CXR etc    ICD-10-CM   1. Sore throat  J02.9 Culture, Group A Strep    Culture, Group A Strep   r/o strep with recent exposure, also viral swab with immunocompromised state, may be viral, currently w/o sinus ttp, fever, body aches    2. Upper respiratory tract infection, unspecified type  J06.9 Respiratory virus panel    Culture, Group A Strep   nasal erythema, discharge, post nasal drip, sore throat and dry cough - for now tx supportive and symptomatic    3. Acute cough  R05.1 Respiratory virus panel    promethazine-dextromethorphan (PROMETHAZINE-DM) 6.25-15 MG/5ML syrup   post nasal drip and likely some bronchospasm - use inhaler, cough syrups, mucinex, no wheeze on exam, lungs CTA A&P    4. Immunocompromised state ([place])  D84.9 Respiratory virus panel    Culture, Group A Strep   lower threshold to tx with antibiotics, did viral respiratory swab and strep culture, last CBC ANC >1000    5. Exposure to strep throat  Z20.818 Culture, Group A Strep   no rapid available today, strep culture done - mild op erythema and rightsided mild cervical lymphadenopathy    6. Centrilobular emphysema (  HCC)  J43.2    worse cough than baseline daily cough but no pleuritic CP, wheeze, SOB, DOE, night sweats - low threshold to cover with abx or get CXR Continue maintenance inhaler and start to use albuterol for coughing fits/try before bedtime Will hold off on steroids for now since no wheeze to lungs and she is already on daily steroids    7. CLL (chronic lymphocytic leukemia) (H[facility])  C91.10    reviewed counts and current tx     Close f/up if not improving or if any worsening     Delsa Grana, PA-C 11/05/22  12:07 PM

## 2022-11-06 NOTE — Progress Notes (Signed)
ANNUAL PREVENTATIVE CARE GYNECOLOGY  ENCOUNTER NOTE  Subjective:       Monique [name] is a 52 y.o. [phone] female here for a routine annual gynecologic exam. The patient is sexually active. The patient is taking hormone replacement therapy, using premarin 0.'625mg'$  tablets. Patient denies post-menopausal vaginal bleeding. The patient wears seatbelts: yes. The patient participates in regular exercise: no. Has the patient ever been transfused or tattooed?: yes. The patient reports that there is not domestic violence in her life.  Current complaints: 1.  Patient reports URI like symptoms such as cough and congestion x 2 weeks. Patient reports was recently seen by her physician and states that viral testing was negative. Patient describes cough as dry and intermittent.  Is using cough suppressant syrup and Tessalon Perles.    Gynecologic History Patient's last menstrual period was 08/17/2016. Contraception: status post hysterectomy Last Pap: 10/26/2020. Results were: abnormal Last mammogram: 12/13/2021. Results were: normal.  Scheduled for April.  Last Colonoscopy: 04/21/2019: 10 years Last Dexa Scan: Never done   Obstetric History OB History  Gravida Para Term Preterm AB Living  '8 4 4   4 4  '$ SAB IAB Ectopic Multiple Live Births  4       4    # Outcome Date GA Lbr Len/2nd Weight Sex Delivery Anes PTL Lv  8 Term 2007   6 lb 0.6 oz (2.74 kg) M Vag-Spont   LIV  7 Term 1999   8 lb 1.8 oz (3.679 kg) F Vag-Spont   LIV  6 Term 1994   6 lb 1.8 oz (2.771 kg) F Vag-Spont   LIV  5 Term 1991   8 lb 0.6 oz (3.647 kg) F Vag-Spont   LIV  4 SAB           3 SAB           2 SAB           1 SAB             Obstetric Comments  1st Menstrual Cycle:  10   1st Pregnancy:  19    Past Medical History:  Diagnosis Date   Anxiety    Bursitis    leg pain   Carpal tunnel syndrome    Cervical dysplasia    hx LEEP over 18 years ago.    Chronic lymphocytic leukemia ([place]) 2018   Dr Grayland Ormond.  (in  lymph nodes)   COPD (chronic obstructive pulmonary disease) ([place])    COVID-19 2021   Depression    Eating disorder    GERD (gastroesophageal reflux disease)    History of self-harm    Insomnia    Obsession    Tobacco abuse    Vitamin B12 deficiency (non anemic)     Family History  Problem Relation Age of Onset   Depression Mother    Cancer Mother        thyroid   Alcohol abuse Father    COPD Father    Alcohol abuse Brother    Depression Brother    Bipolar disorder Brother    Suicidality Brother    ADD / ADHD Son    Breast cancer Neg Hx     Past Surgical History:  Procedure Laterality Date   BREAST BIOPSY Right 01/05/2018   US guided biopsy of 2 areas and 1 lymph node, MIXED INFLAMMATION AND GIANT CELL REACTION   CERVICAL BIOPSY  W/ LOOP ELECTRODE EXCISION     COLONOSCOPY WITH PROPOFOL  N/A 04/21/2019   Procedure: COLONOSCOPY WITH PROPOFOL;  Surgeon: Virgel Manifold, MD;  Location: [facility] ENDOSCOPY;  Service: Gastroenterology;  Laterality: N/A;   OTHER SURGICAL HISTORY     scar tissue removed from vocal cords   TUBAL LIGATION     VAGINAL HYSTERECTOMY N/A 01/22/2021   Procedure: HYSTERECTOMY VAGINAL; BILATERAL SALPINGECTOMY;  Surgeon: Rubie Maid, MD;  Location: [facility] ORS;  Service: Gynecology;  Laterality: N/A;   vocal cord surgery  2005    Social History   Socioeconomic History   Marital status: Divorced    Spouse name: NA   Number of children: 4   Years of education: 12   Highest education level: 12th grade  Occupational History   Occupation: Disabled   Tobacco Use   Smoking status: Every Day    Packs/day: 1.00    Years: 31.00    Total pack years: 31.00    Types: Cigarettes    Start date: 09/25/1986   Smokeless tobacco: Never  Vaping Use   Vaping Use: Former  Substance and Sexual Activity   Alcohol use: Not Currently    Alcohol/week: 0.0 standard drinks of alcohol    Comment: rarely   Drug use: Yes    Frequency: 7.0 times per week    Types:  Marijuana   Sexual activity: Yes    Partners: Male    Birth control/protection: Surgical  Other Topics Concern   Not on file  Social History Narrative   Patient is single Patient is currently on social security disability due to health challenges.     Her teenager son is living with his father    Her father lives in [place] and is on end stage COPD   Social Determinants of Health   Financial Resource Strain: Low Risk  (10/29/2021)   Overall Financial Resource Strain (CARDIA)    Difficulty of Paying Living Expenses: Not hard at all  Food Insecurity: No Food Insecurity (10/29/2021)   Hunger Vital Sign    Worried About Running Out of Food in the Last Year: Never true    Ran Out of Food in the Last Year: Never true  Transportation Needs: No Transportation Needs (10/29/2021)   PRAPARE - Hydrologist (Medical): No    Lack of Transportation (Non-Medical): No  Physical Activity: Insufficiently Active (10/29/2021)   Exercise Vital Sign    Days of Exercise per Week: 3 days    Minutes of Exercise per Session: 10 min  Stress: Stress Concern Present (10/29/2021)   [place]    Feeling of Stress : Very much  Social Connections: Socially Isolated (10/29/2021)   Social Connection and Isolation Panel [NHANES]    Frequency of Communication with Friends and Family: More than three times a week    Frequency of Social Gatherings with Friends and Family: Once a week    Attends Religious Services: Never    Marine scientist or Organizations: No    Attends Archivist Meetings: Never    Marital Status: Divorced  Human resources officer Violence: Not At Risk (10/29/2021)   Humiliation, Afraid, Rape, and Kick questionnaire    Fear of Current or Ex-Partner: No    Emotionally Abused: No    Physically Abused: No    Sexually Abused: No    Current Outpatient Medications on File Prior to Visit  Medication Sig  Dispense Refill   albuterol (VENTOLIN HFA) 108 (90 Base) MCG/ACT inhaler Inhale 2 puffs  into the lungs every 6 (six) hours as needed for wheezing or shortness of breath. 8 g 0   ARIPiprazole (ABILIFY) 5 MG tablet Take 1 tablet (5 mg total) by mouth daily. 90 tablet 1   Ascorbic Acid (VITAMIN C) 1000 MG tablet Take 1,000 mg by mouth daily as needed (immune health).     ASPIRIN 81 PO Take 81 mg by mouth daily.     atorvastatin (LIPITOR) 10 MG tablet Take 1 tablet (10 mg total) by mouth in the morning. 90 tablet 1   Calcium Carbonate-Vit D-Min (GNP CALCIUM 1200) 1200-1000 MG-UNIT CHEW Chew 1,200 mg by mouth daily with breakfast. Take in combination with vitamin D and magnesium. 90 tablet 3   estrogens, conjugated, (PREMARIN) 0.625 MG tablet Take 1 tablet (0.625 mg total) by mouth daily. Take daily for 21 days then do not take for 7 days. 90 tablet 3   lenalidomide (REVLIMID) 10 MG capsule Take 1 capsule (10 mg total) by mouth daily. 28 capsule 0   loratadine (CLARITIN) 10 MG tablet Take 1 tablet (10 mg total) by mouth every morning. 90 tablet 1   Magnesium 500 MG CAPS Take 500 mg by mouth in the morning.     meloxicam (MOBIC) 15 MG tablet TAKE ONE TABLET BY MOUTH EVERY MORNING 90 tablet 1   omeprazole (PRILOSEC) 20 MG capsule Take 1 capsule (20 mg total) by mouth every morning. 90 capsule 1   predniSONE (DELTASONE) 5 MG tablet TAKE ONE TABLET BY MOUTH daily WITH BREAKFAST AFTER finishing THE 10 MG taper 30 tablet 2   pregabalin (LYRICA) 100 MG capsule Take 100 mg by mouth 3 (three) times daily.     promethazine-dextromethorphan (PROMETHAZINE-DM) 6.25-15 MG/5ML syrup Take 2.5-5 mLs by mouth 4 (four) times daily as needed for cough. 118 mL 0   tiZANidine (ZANAFLEX) 2 MG tablet Take 1 tablet (2 mg total) by mouth at bedtime. 90 tablet 1   umeclidinium-vilanterol (ANORO ELLIPTA) 62.5-25 MCG/ACT AEPB Inhale 1 puff into the lungs daily. 60 each 5   vitamin B-12 (CYANOCOBALAMIN) 500 MCG tablet Take 250 mcg  by mouth every morning.     No current facility-administered medications on file prior to visit.    No Known Allergies    Review of Systems ROS Review of Systems - General ROS: negative for - chills, fatigue, fever, hot flashes, night sweats, weight gain or weight loss Psychological ROS: negative for - anxiety, decreased libido, depression, mood swings, physical abuse or sexual abuse Ophthalmic ROS: negative for - blurry vision, eye pain or loss of vision ENT ROS: negative for - headaches, hearing change, visual changes or vocal changes Allergy and Immunology ROS: negative for - hives, itchy/watery eyes or seasonal allergies Hematological and Lymphatic ROS: negative for - bleeding problems, bruising, swollen lymph nodes or weight loss Endocrine ROS: negative for - galactorrhea, hair pattern changes, hot flashes, malaise/lethargy, mood swings, palpitations, polydipsia/polyuria, skin changes, temperature intolerance or unexpected weight changes Breast ROS: negative for - new or changing breast lumps or nipple discharge Respiratory ROS: negative for -  sputum production or shortness of breath. Positive for cough (see HPI).  Cardiovascular ROS: negative for - chest pain, irregular heartbeat, palpitations or shortness of breath Gastrointestinal ROS: no abdominal pain, change in bowel habits, or black or bloody stools Genito-Urinary ROS: no dysuria, trouble voiding, or hematuria Musculoskeletal ROS: negative for - joint pain or joint stiffness Neurological ROS: negative for - bowel and bladder control changes Dermatological ROS: negative for rash and  skin lesion changes   Objective:   LMP 08/17/2016 Blood pressure 103/72, pulse 69, resp. rate 15, height '5\' 7"'$  (1.702 m), weight 185 lb 1.6 oz (84 kg), last menstrual period 08/17/2016.  CONSTITUTIONAL: Well-developed, well-nourished female in no acute distress.  PSYCHIATRIC: Normal mood and affect. Normal behavior. Normal judgment and thought  content. Socastee: Alert and oriented to person, place, and time. Normal muscle tone coordination. No cranial nerve deficit noted. HENT:  Normocephalic, atraumatic, External right and left ear normal. Oropharynx is clear and moist EYES: Conjunctivae and EOM are normal. Pupils are equal, round, and reactive to light. No scleral icterus.  NECK: Normal range of motion, supple, no masses.  Normal thyroid.  SKIN: Skin is warm and dry. No rash noted. Not diaphoretic. No erythema. No pallor. CARDIOVASCULAR: Normal heart rate noted, regular rhythm, no murmur. RESPIRATORY: Clear to auscultation bilaterally. Effort and breath sounds normal, no problems with respiration noted. BREASTS: Symmetric in size. No masses, skin changes, nipple drainage, or lymphadenopathy. ABDOMEN: Soft, normal bowel sounds, no distention noted.  No tenderness, rebound or guarding.  BLADDER: Normal PELVIC:  Bladder no bladder distension noted  Urethra: normal appearing urethra with no masses, tenderness or lesions  Vulva: normal appearing vulva with no masses, tenderness or lesions  Vagina: normal appearing vagina with normal color and discharge, no lesions  Cervix: surgically absent  Uterus: surgically absent, vaginal cuff well healed  Adnexa: normal adnexa in size, nontender and no masses  RV: External Exam NormaI, No Rectal Masses, and Normal Sphincter tone  MUSCULOSKELETAL: Normal range of motion. No tenderness.  No cyanosis, clubbing, or edema.  2+ distal pulses. LYMPHATIC: No Axillary, Supraclavicular, or Inguinal Adenopathy.   Labs: Lab Results  Component Value Date   WBC 5.8 10/10/2022   HGB 14.8 10/10/2022   HCT 44.5 10/10/2022   MCV 103.7 (H) 10/10/2022   PLT 118 (L) 10/10/2022    Lab Results  Component Value Date   CREATININE 0.98 10/10/2022   BUN 20 10/10/2022   NA 141 10/10/2022   K 4.3 10/10/2022   CL 107 10/10/2022   CO2 24 10/10/2022    Lab Results  Component Value Date   ALT 20 10/10/2022    AST 23 10/10/2022   ALKPHOS 57 10/10/2022   BILITOT 0.6 10/10/2022    Lab Results  Component Value Date   CHOL 130 10/15/2022   HDL 61 10/15/2022   LDLCALC 53 10/15/2022   TRIG 79 10/15/2022   CHOLHDL 2.1 10/15/2022    Lab Results  Component Value Date   TSH 1.97 09/12/2016    Lab Results  Component Value Date   HGBA1C 5.1 08/31/2019     Assessment:   1. Encounter for well woman exam with routine gynecological exam   2. Menopausal vasomotor syndrome   3. S/P vaginal hysterectomy   4. Viral upper respiratory tract infection   5. Post-menopause on HRT (hormone replacement therapy)      Plan:  Pap: Not needed. 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 No further f/u indicated.  Mammogram:  Scheduled for April 1.  Colon Screening:   UTD Labs:  performed by PCP Routine preventative health maintenance measures emphasized: Exercise/Diet/Weight control and Stress Management Flu vaccine: up to date.  Viral URI, currently being managed. Noted recent negative testing for flu/COVID.  Currently on HRT for menopausal vasomotory symptoms. Overall doing well. Can continue for up to 5 years without increase in risk.  Return to [place]  Rubie Maid, MD [place]

## 2022-11-07 ENCOUNTER — Inpatient Hospital Stay: Payer: 59

## 2022-11-07 ENCOUNTER — Other Ambulatory Visit: Payer: Self-pay | Admitting: Oncology

## 2022-11-07 ENCOUNTER — Inpatient Hospital Stay: Payer: 59 | Admitting: Pharmacist

## 2022-11-07 ENCOUNTER — Encounter: Payer: Self-pay | Admitting: Family Medicine

## 2022-11-07 ENCOUNTER — Telehealth: Payer: Self-pay | Admitting: Oncology

## 2022-11-07 ENCOUNTER — Inpatient Hospital Stay: Payer: 59 | Admitting: Oncology

## 2022-11-07 DIAGNOSIS — D849 Immunodeficiency, unspecified: Secondary | ICD-10-CM

## 2022-11-07 DIAGNOSIS — C9110 Chronic lymphocytic leukemia of B-cell type not having achieved remission: Secondary | ICD-10-CM

## 2022-11-07 DIAGNOSIS — J432 Centrilobular emphysema: Secondary | ICD-10-CM

## 2022-11-07 DIAGNOSIS — R052 Subacute cough: Secondary | ICD-10-CM

## 2022-11-07 LAB — RESPIRATORY VIRUS PANEL

## 2022-11-07 LAB — CULTURE, GROUP A STREP
MICRO NUMBER:: 14594833
SPECIMEN QUALITY:: ADEQUATE

## 2022-11-07 NOTE — Telephone Encounter (Signed)
Monique [name] sent message through answering service to cancel her appt and call back to reschedule. Team, Infusion suite and pharmacy was notified.

## 2022-11-07 NOTE — Telephone Encounter (Signed)
Patient called saying that she is sick and cannot come in for her appts this morning. I am going to cancel but one of the appts is a treatment. I told her we would call her back to reschedule after I spoke with Dr. Virgel Manifold team. Please advise.

## 2022-11-08 ENCOUNTER — Ambulatory Visit (INDEPENDENT_AMBULATORY_CARE_PROVIDER_SITE_OTHER): Payer: 59 | Admitting: Obstetrics and Gynecology

## 2022-11-08 ENCOUNTER — Encounter: Payer: Self-pay | Admitting: Obstetrics and Gynecology

## 2022-11-08 VITALS — BP 103/72 | HR 69 | Resp 15 | Ht 67.0 in | Wt 185.1 lb

## 2022-11-08 DIAGNOSIS — Z79890 Hormone replacement therapy (postmenopausal): Secondary | ICD-10-CM

## 2022-11-08 DIAGNOSIS — Z90710 Acquired absence of both cervix and uterus: Secondary | ICD-10-CM

## 2022-11-08 DIAGNOSIS — J069 Acute upper respiratory infection, unspecified: Secondary | ICD-10-CM

## 2022-11-08 DIAGNOSIS — N871 Moderate cervical dysplasia: Secondary | ICD-10-CM

## 2022-11-08 DIAGNOSIS — Z01419 Encounter for gynecological examination (general) (routine) without abnormal findings: Secondary | ICD-10-CM

## 2022-11-08 DIAGNOSIS — N951 Menopausal and female climacteric states: Secondary | ICD-10-CM

## 2022-11-08 MED ORDER — DOXYCYCLINE HYCLATE 100 MG PO TABS: 100.0000 mg | ORAL_TABLET | Freq: Two times a day (BID) | ORAL | 0 refills | Status: DC

## 2022-11-08 NOTE — Telephone Encounter (Signed)
Pt returned call.  Please call her back when you get a chance.  Thanks [employer]

## 2022-11-08 NOTE — Telephone Encounter (Signed)
I do see patient read provider mychart message, I gave her a call no answer left detailed vm to call back

## 2022-11-08 NOTE — Telephone Encounter (Signed)
Pt notified- verbalized understanding.

## 2022-11-11 ENCOUNTER — Ambulatory Visit: Payer: Self-pay | Admitting: *Deleted

## 2022-11-11 NOTE — Telephone Encounter (Signed)
Pt has an appt for 11/12/22

## 2022-11-11 NOTE — Telephone Encounter (Signed)
Called patient and made aware. She gave verbal understanding.

## 2022-11-11 NOTE — Telephone Encounter (Signed)
  Chief Complaint: Rash Symptoms: Blotchy rash on face. "Two yesterday, several more now."  Frequency: Yesterday Pertinent Negatives: Patient denies swelling, itching, pain Disposition: []$ ED /[]$ Urgent Care (no appt availability in office) / []$ Appointment(In office/virtual)/ []$  [facility]/ []$ Home Care/ []$ Refused Recommended Disposition /[]$ [facility]/ [x]$  Follow-up with PCP Additional Notes: Pt started Doxy on Friday with 1 tab, took 2 tabs Sat and Sun. States cough is much better "But this rash  must be from the medicine." Care advise provided,verbalizes understanding. Please advise Reason for Disposition  Medication patch causing local rash or itching    No itching, maybe ATB  Answer Assessment - Initial Assessment Questions 1. APPEARANCE of RASH: "Describe the rash."      "Blotches" 2. LOCATION: "Where is the rash located?"      Face 3. NUMBER: "How many spots are there?"      " Two yesterday now several more." 4. SIZE: "How big are the spots?" (Inches, centimeters or compare to size of a coin)      VAries 5. ONSET: "When did the rash start?"      Yesterday. Started Doxy Friday 6. ITCHING: "Does the rash itch?" If Yes, ask: "How bad is the itch?"  (Scale 0-10; or none, mild, moderate, severe)     no 7. PAIN: "Does the rash hurt?" If Yes, ask: "How bad is the pain?"  (Scale 0-10; or none, mild, moderate, severe)    - NONE (0): no pain    - MILD (1-3): doesn't interfere with normal activities     - MODERATE (4-7): interferes with normal activities or awakens from sleep     - SEVERE (8-10): excruciating pain, unable to do any normal activities     no 8. OTHER SYMPTOMS: "Do you have any other symptoms?" (e.g., fever)     None  Protocols used: Rash or Redness - Localized-A-AH

## 2022-11-12 ENCOUNTER — Ambulatory Visit (INDEPENDENT_AMBULATORY_CARE_PROVIDER_SITE_OTHER): Payer: 59 | Admitting: Physician Assistant

## 2022-11-12 ENCOUNTER — Encounter: Payer: Self-pay | Admitting: Physician Assistant

## 2022-11-12 VITALS — BP 106/72 | HR 87 | Temp 98.0°F | Resp 14 | Ht 67.0 in | Wt 184.1 lb

## 2022-11-12 DIAGNOSIS — J069 Acute upper respiratory infection, unspecified: Secondary | ICD-10-CM | POA: Diagnosis not present

## 2022-11-12 DIAGNOSIS — T7840XA Allergy, unspecified, initial encounter: Secondary | ICD-10-CM

## 2022-11-12 MED ORDER — AMOXICILLIN-POT CLAVULANATE 875-125 MG PO TABS: 1.0000 | ORAL_TABLET | Freq: Two times a day (BID) | ORAL | 0 refills | Status: AC

## 2022-11-12 NOTE — Progress Notes (Signed)
Acute Office Visit   Patient: Dezha Meras   DOB: 12-17-1970   52 y.o. Female  MRN: [id number] Visit Date: 11/12/2022  Today's healthcare provider: Dani Gobble Doll Frazee, PA-C  Introduced myself to the patient as a Journalist, newspaper and provided education on APPs in clinical practice.    Chief Complaint  Patient presents with   Rash    Doxycycline/Face   Subjective    HPI HPI     Rash    Additional comments: Doxycycline/Face      Last edited by Royal Hawthorn, CMA on 11/12/2022  8:21 AM.       Rash  Onset: sudden Duration: started Sunday  Location: along face- cheeks  Appearance: looked "welty" and red  Itching: no Blisters: no Spreading: none that she has noticed  She denies increased sun exposure when rash first appeared  New medications: She started Doxycycline on Friday for sinusitis  New topicals: nothing new on skin  New soaps or detergents: none   Similar symptoms in others in home: she denies similar symptoms in others   Interventions: Benadryl  Alleviating: stopping the Doxycycline seemed to make rash improve- she took one pill on Monday morning and has not had anymore since  Aggravating: nothing     Medications: Outpatient Medications Prior to Visit  Medication Sig   albuterol (VENTOLIN HFA) 108 (90 Base) MCG/ACT inhaler Inhale 2 puffs into the lungs every 6 (six) hours as needed for wheezing or shortness of breath.   ARIPiprazole (ABILIFY) 5 MG tablet Take 1 tablet (5 mg total) by mouth daily.   Ascorbic Acid (VITAMIN C) 1000 MG tablet Take 1,000 mg by mouth daily as needed (immune health).   ASPIRIN 81 PO Take 81 mg by mouth daily.   atorvastatin (LIPITOR) 10 MG tablet Take 1 tablet (10 mg total) by mouth in the morning.   estrogens, conjugated, (PREMARIN) 0.625 MG tablet Take 1 tablet (0.625 mg total) by mouth daily. Take daily for 21 days then do not take for 7 days.   lenalidomide (REVLIMID) 10 MG capsule Take 1 capsule (10 mg total) by mouth  daily.   loratadine (CLARITIN) 10 MG tablet Take 1 tablet (10 mg total) by mouth every morning.   Magnesium 500 MG CAPS Take 500 mg by mouth in the morning.   meloxicam (MOBIC) 15 MG tablet TAKE ONE TABLET BY MOUTH EVERY MORNING   omeprazole (PRILOSEC) 20 MG capsule Take 1 capsule (20 mg total) by mouth every morning.   predniSONE (DELTASONE) 5 MG tablet TAKE ONE TABLET BY MOUTH daily WITH BREAKFAST AFTER finishing THE 10 MG taper   pregabalin (LYRICA) 100 MG capsule Take 100 mg by mouth 3 (three) times daily.   promethazine-dextromethorphan (PROMETHAZINE-DM) 6.25-15 MG/5ML syrup Take 2.5-5 mLs by mouth 4 (four) times daily as needed for cough.   tiZANidine (ZANAFLEX) 2 MG tablet Take 1 tablet (2 mg total) by mouth at bedtime.   umeclidinium-vilanterol (ANORO ELLIPTA) 62.5-25 MCG/ACT AEPB Inhale 1 puff into the lungs daily.   vitamin B-12 (CYANOCOBALAMIN) 500 MCG tablet Take 250 mcg by mouth every morning.   Calcium Carbonate-Vit D-Min (GNP CALCIUM 1200) 1200-1000 MG-UNIT CHEW Chew 1,200 mg by mouth daily with breakfast. Take in combination with vitamin D and magnesium.   doxycycline (VIBRA-TABS) 100 MG tablet Take 1 tablet (100 mg total) by mouth 2 (two) times daily for 7 days. (Patient not taking: Reported on 11/12/2022)   No facility-administered medications prior to  visit.    Review of Systems  Constitutional:  Negative for diaphoresis.  HENT:  Positive for congestion, postnasal drip, rhinorrhea, sinus pressure and sinus pain.   Respiratory:  Positive for cough. Negative for chest tightness, shortness of breath and wheezing.   Cardiovascular:  Negative for chest pain.  Skin:  Positive for rash.       Objective    BP 106/72 (BP Location: Right Arm, Patient Position: Sitting, Cuff Size: Normal)   Pulse 87   Temp 98 F (36.7 C) (Oral)   Resp 14   Ht '5\' 7"'$  (1.702 m)   Wt 184 lb 1.6 oz (83.5 kg)   LMP 08/17/2016   SpO2 93%   BMI 28.83 kg/m    Physical Exam Vitals reviewed.   Constitutional:      General: She is awake.     Appearance: Normal appearance. She is well-developed and well-groomed.  HENT:     Head: Normocephalic and atraumatic.   Eyes:     General: Lids are normal. Gaze aligned appropriately.     Extraocular Movements: Extraocular movements intact.     Conjunctiva/sclera: Conjunctivae normal.  Pulmonary:     Effort: Pulmonary effort is normal.  Musculoskeletal:     Cervical back: Normal range of motion.  Neurological:     General: No focal deficit present.     Mental Status: She is alert and oriented to person, place, and time. Mental status is at baseline.  Psychiatric:        Mood and Affect: Mood normal.        Behavior: Behavior normal. Behavior is cooperative.        Thought Content: Thought content normal.        Judgment: Judgment normal.       No results found for any visits on 11/12/22.  Assessment & Plan      No follow-ups on file.      Problem List Items Addressed This Visit   None Visit Diagnoses     Allergic reaction to drug, initial encounter    -  Primary Acute, new concern Patient reports she started Doxycycline for sinus infection on Fri and she noticed a rash forming on her face on Sun  She denies other reactions at this time  She denies any other changes to her daily regimen or new topical treatments since the rash started She was instructed to stop the Doxy yesterday and she has noticed improvement in rash since dc Suspect this is mild allergic reaction and will update her allergy list to include Doxycycline - patient is amenable to this update Recommend she continue to take antihistamines for management of reaction    Upper respiratory tract infection, unspecified type     Acute, ongoing She reports she did have improvement while taking the Doxycycline but since she stopped her symptoms have returned Will dc Doxycycline and start 5 day course of Augmentin for management Recommend she continue with her  symptomatic management with cough medications and Mucinex  Follow up as needed for persistent or progressing symptoms     Relevant Medications   amoxicillin-clavulanate (AUGMENTIN) 875-125 MG tablet        No follow-ups on file.   I, Hassen Bruun E Taia Bramlett, PA-C, have reviewed all documentation for this visit. The documentation on 11/12/22 for the exam, diagnosis, procedures, and orders are all accurate and complete.   Talitha Givens, MHS, PA-C [facility]

## 2022-11-13 MED FILL — Dexamethasone Sodium Phosphate Inj 100 MG/10ML: INTRAMUSCULAR | Qty: 2 | Status: AC

## 2022-11-14 ENCOUNTER — Encounter: Payer: Self-pay | Admitting: Oncology

## 2022-11-14 ENCOUNTER — Inpatient Hospital Stay (HOSPITAL_BASED_OUTPATIENT_CLINIC_OR_DEPARTMENT_OTHER): Payer: 59 | Admitting: Oncology

## 2022-11-14 ENCOUNTER — Inpatient Hospital Stay: Payer: 59 | Attending: Oncology

## 2022-11-14 ENCOUNTER — Inpatient Hospital Stay: Payer: 59

## 2022-11-14 VITALS — BP 104/55 | HR 72 | Temp 97.5°F | Resp 16 | Ht 67.0 in | Wt 186.5 lb

## 2022-11-14 VITALS — BP 98/64 | HR 68 | Temp 97.5°F | Resp 16

## 2022-11-14 DIAGNOSIS — C9110 Chronic lymphocytic leukemia of B-cell type not having achieved remission: Secondary | ICD-10-CM

## 2022-11-14 DIAGNOSIS — Z79899 Other long term (current) drug therapy: Secondary | ICD-10-CM | POA: Insufficient documentation

## 2022-11-14 DIAGNOSIS — Z5112 Encounter for antineoplastic immunotherapy: Secondary | ICD-10-CM | POA: Diagnosis not present

## 2022-11-14 LAB — CBC WITH DIFFERENTIAL/PLATELET
Abs Immature Granulocytes: 0 10*3/uL (ref 0.00–0.07)
Basophils Absolute: 0.1 10*3/uL (ref 0.0–0.1)
Basophils Relative: 1 %
Eosinophils Absolute: 0.2 10*3/uL (ref 0.0–0.5)
Eosinophils Relative: 3 %
HCT: 40.6 % (ref 36.0–46.0)
Hemoglobin: 13.9 g/dL (ref 12.0–15.0)
Lymphocytes Relative: 46 %
Lymphs Abs: 2.6 10*3/uL (ref 0.7–4.0)
MCH: 34.4 pg — ABNORMAL HIGH (ref 26.0–34.0)
MCHC: 34.2 g/dL (ref 30.0–36.0)
MCV: 100.5 fL — ABNORMAL HIGH (ref 80.0–100.0)
Monocytes Absolute: 0.5 10*3/uL (ref 0.1–1.0)
Monocytes Relative: 9 %
Neutro Abs: 2.3 10*3/uL (ref 1.7–7.7)
Neutrophils Relative %: 41 %
Platelets: 174 10*3/uL (ref 150–400)
RBC: 4.04 MIL/uL (ref 3.87–5.11)
RDW: 12.6 % (ref 11.5–15.5)
Smear Review: NORMAL
WBC: 5.6 10*3/uL (ref 4.0–10.5)
nRBC: 0 % (ref 0.0–0.2)

## 2022-11-14 LAB — COMPREHENSIVE METABOLIC PANEL
ALT: 19 U/L (ref 0–44)
AST: 17 U/L (ref 15–41)
Albumin: 3.5 g/dL (ref 3.5–5.0)
Alkaline Phosphatase: 68 U/L (ref 38–126)
Anion gap: 10 (ref 5–15)
BUN: 14 mg/dL (ref 6–20)
CO2: 25 mmol/L (ref 22–32)
Calcium: 8.4 mg/dL — ABNORMAL LOW (ref 8.9–10.3)
Chloride: 105 mmol/L (ref 98–111)
Creatinine, Ser: 1.07 mg/dL — ABNORMAL HIGH (ref 0.44–1.00)
GFR, Estimated: >60 mL/min (ref >60)
Glucose, Bld: 89 mg/dL (ref 70–99)
Potassium: 3.7 mmol/L (ref 3.5–5.1)
Sodium: 140 mmol/L (ref 135–145)
Total Bilirubin: 0.5 mg/dL (ref 0.3–1.2)
Total Protein: 5.9 g/dL — ABNORMAL LOW (ref 6.5–8.1)

## 2022-11-14 MED ORDER — FAMOTIDINE IN NACL 20-0.9 MG/50ML-% IV SOLN
20.0000 mg | Freq: Once | INTRAVENOUS | Status: AC
  Administered 2022-11-14: 20 mg via INTRAVENOUS
  Filled 2022-11-14: qty 50

## 2022-11-14 MED ORDER — SODIUM CHLORIDE 0.9 % IV SOLN
20.0000 mg | Freq: Once | INTRAVENOUS | Status: AC
  Administered 2022-11-14: 20 mg via INTRAVENOUS
  Filled 2022-11-14: qty 20
  Filled 2022-11-14: qty 2

## 2022-11-14 MED ORDER — SODIUM CHLORIDE 0.9 % IV SOLN
Freq: Once | INTRAVENOUS | Status: AC
  Filled 2022-11-14: qty 250

## 2022-11-14 MED ORDER — SODIUM CHLORIDE 0.9 % IV SOLN
375.0000 mg/m2 | Freq: Once | INTRAVENOUS | Status: AC
  Administered 2022-11-14: 700 mg via INTRAVENOUS
  Filled 2022-11-14: qty 50

## 2022-11-14 MED ORDER — ACETAMINOPHEN 325 MG PO TABS
650.0000 mg | ORAL_TABLET | Freq: Once | ORAL | Status: AC
  Administered 2022-11-14: 650 mg via ORAL
  Filled 2022-11-14: qty 2

## 2022-11-14 MED ORDER — DIPHENHYDRAMINE HCL 25 MG PO CAPS
50.0000 mg | ORAL_CAPSULE | Freq: Once | ORAL | Status: AC
  Administered 2022-11-14: 50 mg via ORAL
  Filled 2022-11-14: qty 2

## 2022-11-14 NOTE — Progress Notes (Signed)
[place]  [phone]) [phone] [phone]) [phone]  ID: [name] OB: 06/06/1971  MR#: [id number]  [id number]  Patient Care Team: [name], MD as PCP - General (Family Medicine) Lloyd Huger, MD as Consulting Physician (Oncology) Lane Hacker, [facility] as Pharmacist (Pharmacist) Greg Cutter, LCSW as [place] Management (Licensed Clinical Social Worker) Gloris Ham, RN as Registered Nurse (Oncology) Borders, Kirt Boys, NP as Nurse Practitioner (Hospice and Palliative Medicine)  CHIEF COMPLAINT: CLL.  INTERVAL HISTORY: Patient returns to clinic today for further evaluation and continuation of Rituxan and Revlimid.  She missed her treatment last week secondary to a sinus infection and remains on antibiotics.  She denies any fevers and states her symptoms are still evident but improving.  She developed a rash from doxycycline, but this has resolved and patient is now on Augmentin.  She has some mild diarrhea.  She otherwise feels well.  She has a chronic peripheral neuropathy.  She is tolerating her treatments without significant side effects.  She has no other neurologic complaints.  She has a good appetite and denies weight loss.  She has no chest pain, shortness of breath, cough, or hemoptysis.  She denies any nausea, vomiting, or constipation.  She has no melena or hematochezia.  She has no urinary complaints.  Patient offers no further specific complaints today.  REVIEW OF SYSTEMS:   Review of Systems  Constitutional: Negative.  Negative for diaphoresis, fever, malaise/fatigue and weight loss.  HENT:  Positive for congestion.   Respiratory:  Positive for cough. Negative for shortness of breath.   Cardiovascular: Negative.  Negative for chest pain and leg swelling.  Gastrointestinal:  Positive for diarrhea. Negative for abdominal pain and constipation.  Genitourinary: Negative.  Negative for dysuria and  frequency.  Musculoskeletal: Negative.  Negative for back pain, myalgias and neck pain.  Skin:  Positive for rash.  Neurological:  Positive for tingling and sensory change. Negative for focal weakness and weakness.  Psychiatric/Behavioral: Negative.  Negative for depression and suicidal ideas. The patient is not nervous/anxious.     As per HPI. Otherwise, a complete review of systems is negative.  PAST MEDICAL HISTORY: Past Medical History:  Diagnosis Date   Anxiety    Bursitis    leg pain   Carpal tunnel syndrome    Cervical dysplasia    hx LEEP over 18 years ago.    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  (in lymph nodes)   COPD (chronic obstructive pulmonary disease) ([place])    COVID-19 2021   Depression    Eating disorder    GERD (gastroesophageal reflux disease)    History of self-harm    Insomnia    Obsession    Tobacco abuse    Vitamin B12 deficiency (non anemic)     PAST SURGICAL HISTORY: Past Surgical History:  Procedure Laterality Date   BREAST BIOPSY Right 01/05/2018   US guided biopsy of 2 areas and 1 lymph node, MIXED INFLAMMATION AND GIANT CELL REACTION   CERVICAL BIOPSY  W/ LOOP ELECTRODE EXCISION     COLONOSCOPY WITH PROPOFOL N/A 04/21/2019   Procedure: COLONOSCOPY WITH PROPOFOL;  Surgeon: Virgel Manifold, MD;  Location: [facility] ENDOSCOPY;  Service: Gastroenterology;  Laterality: N/A;   OTHER SURGICAL HISTORY     scar tissue removed from vocal cords   TUBAL LIGATION     VAGINAL HYSTERECTOMY N/A 01/22/2021   Procedure: HYSTERECTOMY VAGINAL; BILATERAL SALPINGECTOMY;  Surgeon: Rubie Maid,  MD;  Location: [facility] ORS;  Service: Gynecology;  Laterality: N/A;   vocal cord surgery  2005    FAMILY HISTORY: Family History  Problem Relation Age of Onset   Depression Mother    Cancer Mother        thyroid   Alcohol abuse Father    COPD Father    Alcohol abuse Brother    Depression Brother    Bipolar disorder Brother    Suicidality Brother     ADD / ADHD Son    Breast cancer Neg Hx     ADVANCED DIRECTIVES (Y/N):  N  HEALTH MAINTENANCE: Social History   Tobacco Use   Smoking status: Every Day    Packs/day: 1.00    Years: 31.00    Total pack years: 31.00    Types: Cigarettes    Start date: 09/25/1986   Smokeless tobacco: Never  Vaping Use   Vaping Use: Former  Substance Use Topics   Alcohol use: Not Currently    Alcohol/week: 0.0 standard drinks of alcohol    Comment: rarely   Drug use: Yes    Frequency: 7.0 times per week    Types: Marijuana     Colonoscopy:  PAP:  Bone density:  Lipid panel:  Allergies  Allergen Reactions   Doxycycline Rash    Rash on cheeks and sides of nose     Current Outpatient Medications  Medication Sig Dispense Refill   albuterol (VENTOLIN HFA) 108 (90 Base) MCG/ACT inhaler Inhale 2 puffs into the lungs every 6 (six) hours as needed for wheezing or shortness of breath. 8 g 0   amoxicillin-clavulanate (AUGMENTIN) 875-125 MG tablet Take 1 tablet by mouth 2 (two) times daily for 5 days. 10 tablet 0   ARIPiprazole (ABILIFY) 5 MG tablet Take 1 tablet (5 mg total) by mouth daily. 90 tablet 1   Ascorbic Acid (VITAMIN C) 1000 MG tablet Take 1,000 mg by mouth daily as needed (immune health).     ASPIRIN 81 PO Take 81 mg by mouth daily.     atorvastatin (LIPITOR) 10 MG tablet Take 1 tablet (10 mg total) by mouth in the morning. 90 tablet 1   estrogens, conjugated, (PREMARIN) 0.625 MG tablet Take 1 tablet (0.625 mg total) by mouth daily. Take daily for 21 days then do not take for 7 days. 90 tablet 3   lenalidomide (REVLIMID) 10 MG capsule Take 1 capsule (10 mg total) by mouth daily. 28 capsule 0   loratadine (CLARITIN) 10 MG tablet Take 1 tablet (10 mg total) by mouth every morning. 90 tablet 1   Magnesium 500 MG CAPS Take 500 mg by mouth in the morning.     meloxicam (MOBIC) 15 MG tablet TAKE ONE TABLET BY MOUTH EVERY MORNING 90 tablet 1   omeprazole (PRILOSEC) 20 MG capsule Take 1 capsule  (20 mg total) by mouth every morning. 90 capsule 1   predniSONE (DELTASONE) 5 MG tablet TAKE ONE TABLET BY MOUTH daily WITH BREAKFAST AFTER finishing THE 10 MG taper 30 tablet 2   pregabalin (LYRICA) 100 MG capsule Take 100 mg by mouth 3 (three) times daily.     promethazine-dextromethorphan (PROMETHAZINE-DM) 6.25-15 MG/5ML syrup Take 2.5-5 mLs by mouth 4 (four) times daily as needed for cough. 118 mL 0   tiZANidine (ZANAFLEX) 2 MG tablet Take 1 tablet (2 mg total) by mouth at bedtime. 90 tablet 1   umeclidinium-vilanterol (ANORO ELLIPTA) 62.5-25 MCG/ACT AEPB Inhale 1 puff into the lungs daily. 60 each  5   vitamin B-12 (CYANOCOBALAMIN) 500 MCG tablet Take 250 mcg by mouth every morning.     Calcium Carbonate-Vit D-Min (GNP CALCIUM 1200) 1200-1000 MG-UNIT CHEW Chew 1,200 mg by mouth daily with breakfast. Take in combination with vitamin D and magnesium. 90 tablet 3   No current facility-administered medications for this visit.   Facility-Administered Medications Ordered in Other Visits  Medication Dose Route Frequency Provider Last Rate Last Admin   dexamethasone (DECADRON) 20 mg in sodium chloride 0.9 % 50 mL IVPB  20 mg Intravenous Once Lloyd Huger, MD 208 mL/hr at 11/14/22 1001 20 mg at 11/14/22 1001   famotidine (PEPCID) IVPB 20 mg premix  20 mg Intravenous Once Lloyd Huger, MD       riTUXimab-pvvr (RUXIENCE) 700 mg in sodium chloride 0.9 % 250 mL (2.1875 mg/mL) infusion  375 mg/m2 (Treatment Plan Recorded) Intravenous Once Lloyd Huger, MD        OBJECTIVE: Vitals:   11/14/22 0905  BP: (!) 104/55  Pulse: 72  Resp: 16  Temp: (!) 97.5 F (36.4 C)  SpO2: 99%      Body mass index is 29.21 kg/m.    ECOG FS:0 - Asymptomatic  General: Well-developed, well-nourished, no acute distress. Eyes: Pink conjunctiva, anicteric sclera. HEENT: Normocephalic, moist mucous membranes. Lungs: No audible wheezing or coughing. Heart: Regular rate and rhythm. Abdomen: Soft,  nontender, no obvious distention. Musculoskeletal: No edema, cyanosis, or clubbing. Neuro: Alert, answering all questions appropriately. Cranial nerves grossly intact. Skin: No rashes or petechiae noted. Psych: Normal affect. Lymphatics: No palpable lymphadenopathy.  LAB RESULTS:  Lab Results  Component Value Date   NA 140 11/14/2022   K 3.7 11/14/2022   CL 105 11/14/2022   CO2 25 11/14/2022   GLUCOSE 89 11/14/2022   BUN 14 11/14/2022   CREATININE 1.07 (H) 11/14/2022   CALCIUM 8.4 (L) 11/14/2022   PROT 5.9 (L) 11/14/2022   ALBUMIN 3.5 11/14/2022   AST 17 11/14/2022   ALT 19 11/14/2022   ALKPHOS 68 11/14/2022   BILITOT 0.5 11/14/2022   GFRNONAA >60 11/14/2022   GFRAA 79 12/06/2020    Lab Results  Component Value Date   WBC 5.6 11/14/2022   NEUTROABS 2.3 11/14/2022   HGB 13.9 11/14/2022   HCT 40.6 11/14/2022   MCV 100.5 (H) 11/14/2022   PLT 174 11/14/2022     STUDIES: No results found.  ONCOLOGY HISTORY:  Patient completed cycle 3 of Rituxan plus Treanda on March 26, 2018, but was then noted to have progressive disease.  She was then initiated on 480 mg Imbruvica in November 2019, but had progression of disease with increasing white blood cell count as well as CT scan on Jan 22, 2022 revealing increased lymphadenopathy consistent with progression of disease.  Attempted patient on venetoclax, but she could not tolerate it secondary to persistent diarrhea and transaminitis.   ASSESSMENT: CLL.  PLAN:    CLL: Confirmed by peripheral blood flow cytometry.  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  Patient recently completed 4 weekly cycles of Rituxan and now has transition to treatment every 4 weeks.  CT scan results from July 31, 2022 revealed mild progression of disease, but will continue current treatment at this time.  Patient expressed understanding that we will likely need to change  treatments in the near future.  She has been instructed to continue prednisone, Claritin, and famotidine along  with her 10 mg Revlimid daily.  Proceed with cycle 12 of treatment today.  Return to clinic in 4 weeks for further evaluation and consideration of cycle 13.  Will reimage prior to next treatment. Leukocytosis: Resolved. Trigeminal neuralgia: Resolved.  MRI of the brain on Jan 25, 2019 was unremarkable.   Depression/anxiety: Chronic and unchanged.  Continue follow-up and treatment per primary care. Neuropathic pain/peripheral neuropathy: Chronic and unchanged.  She reports she is now on disability.  Continue current follow-up and treatment as per neurology. Rituxan reaction: Patient has been given additional premedications for preventative measures.  Thrombocytopenia: Resolved. Rash: Resolved with prednisone, Claritin, and famotidine.  Continue Revlimid as above.  If patient remains on prednisone long-term, she will require bone mineral density. Axillary tenderness: Patient does not complain of this today. Congestion/cough: Continue Augmentin as prescribed.  Proceed with treatment as above.   Patient expressed understanding and was in agreement with this plan. She also understands that She can call clinic at any time with any questions, concerns, or complaints.    Lloyd Huger, MD   11/14/2022 10:03 AM

## 2022-11-14 NOTE — Progress Notes (Signed)
Still has rash on face from doxycycline. Currently taking augmentin for URI. Needs rx for diarrhea.

## 2022-11-19 ENCOUNTER — Telehealth: Payer: Self-pay | Admitting: Family Medicine

## 2022-11-19 NOTE — Telephone Encounter (Unsigned)
Copied from [place] [id number]. Topic: General - Inquiry >> Nov 19, 2022 12:11 PM Teressa P wrote: Reason for CRM: pt says she drinks a lot of water everyday and has yellow urine.  She is concerned about diabetes.  I ask her about coming in and she said she has been in.  Please advise  [phone]  she wants a nurse to call her.

## 2022-11-19 NOTE — Telephone Encounter (Signed)
Contacted Monique [name] to schedule their annual wellness visit. Appointment made for 01/22/2023.  [place] Direct Dial: [phone]

## 2022-11-22 ENCOUNTER — Ambulatory Visit (INDEPENDENT_AMBULATORY_CARE_PROVIDER_SITE_OTHER): Payer: 59

## 2022-11-22 VITALS — Ht 67.0 in | Wt 186.0 lb

## 2022-11-22 DIAGNOSIS — Z0000 Encounter for general adult medical examination without abnormal findings: Secondary | ICD-10-CM

## 2022-11-22 NOTE — Patient Instructions (Signed)
Monique [name] , Thank you for taking time to come for your Medicare Wellness Visit. I appreciate your ongoing commitment to your health goals. Please review the following plan we discussed and let me know if I can assist you in the future.   These are the goals we discussed:  Goals      Manage My Emotions     Monique [name] is a 52 y.o. year old female who sees [name], MD for primary care. The  [facility] Managed Care team was consulted for assistance with Mental Health Concerns . Monique [name] was given information about Care Management services, agreed to services, and verbal consent for services was obtained.  Timeframe:  Long-Range Goal Priority:  Medium Start Date:   01/04/21                      Expected End Date:  03/06/21                    Follow Up Date - 01/09/21   - begin personal counseling - call and visit an old friend - check out volunteer opportunities - join a support group - laugh; watch a funny movie or comedian - learn and use visualization or guided imagery - perform a random act of kindness - practice relaxation or meditation daily - start or continue a personal journal - talk about feelings with a friend, family or spiritual advisor - practice positive thinking and self-talk    Why is this important?   When you are stressed, down or upset, your body reacts too.  For example, your blood pressure may get higher; you may have a headache or stomachache.  When your emotions get the best of you, your body's ability to fight off cold and flu gets weak.  These steps will help you manage your emotions.     Current Barriers:  Chronic Mental Health needs related to Anxiety and Depression Limited social support, ADL IADL limitations, Mental Health Concerns , and Social Isolation Suicidal Ideation/Homicidal Ideation: No  Clinical Social Work Goal(s):  Over the next 120 days, patient will work with SW monthly by telephone or in person to reduce or manage symptoms  related to anxiety and depression patient will work with CCM LCSW to address needs related to finding a long term therapist  patient will experience decrease in ED visits as evidenced by patient report and review of EMR. ED visits in last 6 months. patient will attend all scheduled medical appointments as evidenced by patient report and care team review of appointment completion in EMR.   Interventions: Patient interviewed and appropriate assessments performed: brief mental health assessment and PHQ 2 and 9 completed.  Patient interviewed and appropriate assessments performed Provided mental health counseling with regard to mental health management. Patient is experiencing ongoing depression and anxiety and is having a difficult time finding a mental health provider that will accept her for services. Patient was informed that [facility] declined her referral on 12/11/20. Patient has no current psychiatrist or counselor but reports that her PCP is agreeable to keep prescribing her psychotropic medications. Patient reports that she worked previously with a Milton Ferguson at the [facility] and wishes for CCM LCSW to contact her to ask if she would consider seeing her again. Update 01/10/21- Patient confirmed that she has her evaluation and counseling appointment with Milton Ferguson on 01/17/21. She reports that this appointment is in person and confirms stable transportation  to and from this.  Provided patient with information about available mental health providers that accept Medicaid such as [facility] [phone], [facility] [phone],  [facility] [phone], [place] [facility], [facility] [phone] and [facility] [phone]. Patient wishes to gain therapy with her previous provider. CCM LCSW contacted Estill Bamberg and spoke with her directly. She is agreeable to start talk therapy again with patient  and ask that patient contact her to get on her schedule. CCM LCSW completed return call back to patient and provided update and scheduled follow up appointment. Patient is agreeable to contact Estill Bamberg to schedule evaluation and initiate services. [facility] LCSW confirmed with patient that appointment has been set for 01/17/21.  Discussed plans with patient for ongoing care management follow up and provided patient with direct contact information for care management team LCSW discussed coping skills for anxiety. SW used empathetic and active and reflective listening, validated patient's feelings/concerns, and provided emotional support. LCSW provided self-care examples to help patient manage their multiple health conditions and improve her mood.  Assisted patient/caregiver with obtaining information about health plan benefits Provided education and assistance to client regarding Advanced Directives. Emotional Supportive Provided, Problem Solving /Task Center , Psychoeducation /Health Education, and Motivational Interviewing  Patient Self Care Activities:  Attends all scheduled provider appointments Ability for insight Independent living Motivation for treatment  Patient Coping Strengths:  Hopefulness Self Advocate Able to Communicate Effectively  Patient Self Care Deficits:  Lacks social connections     Manage My Medicine     Timeframe:  Short-Term Goal Priority:  High Start Date:                             Expected End Date:                       Follow Up Date Monthly   - call for medicine refill 2 or 3 days before it runs out - call if I am sick and can't take my medicine    Why is this important?   These steps will help you keep on track with your medicines.   Notes: Patient having trouble getting refills from Rx, will call me if more issues arise        This is a list of the screening recommended for you and due dates:  Health Maintenance  Topic Date Due   Mammogram  12/14/2022    Screening for Lung Cancer  08/01/2023   Pap Smear  10/27/2023   Medicare Annual Wellness Visit  11/22/2023   Colon Cancer Screening  04/20/2029   DTaP/Tdap/Td vaccine (3 - Td or Tdap) 05/20/2031   Flu Shot  Completed   Hepatitis C Screening: USPSTF Recommendation to screen - Ages 18-79 yo.  Completed   HIV Screening  Completed   Zoster (Shingles) Vaccine  Completed   HPV Vaccine  Aged Out   COVID-19 Vaccine  Discontinued    Advanced directives: yes  Conditions/risks identified: low falls risk  Next appointment: Follow up in one year for your annual wellness visit. 11/28/2023 '@2pm'$  telephone  Preventive Care 40-64 Years, Female Preventive care refers to lifestyle choices and visits with your health care provider that can promote health and wellness. What does preventive care include? A yearly physical exam. This is also called an annual well check. Dental exams once or twice a year. Routine eye exams. Ask your health care provider how  often you should have your eyes checked. Personal lifestyle choices, including: Daily care of your teeth and gums. Regular physical activity. Eating a healthy diet. Avoiding tobacco and drug use. Limiting alcohol use. Practicing safe sex. Taking low-dose aspirin daily starting at age 67. Taking vitamin and mineral supplements as recommended by your health care provider. What happens during an annual well check? The services and screenings done by your health care provider during your annual well check will depend on your age, overall health, lifestyle risk factors, and family history of disease. Counseling  Your health care provider may ask you questions about your: Alcohol use. Tobacco use. Drug use. Emotional well-being. Home and relationship well-being. Sexual activity. Eating habits. Work and work Statistician. Method of birth control. Menstrual cycle. Pregnancy history. Screening  You may have the following tests or  measurements: Height, weight, and BMI. Blood pressure. Lipid and cholesterol levels. These may be checked every 5 years, or more frequently if you are over 71 years old. Skin check. Lung cancer screening. You may have this screening every year starting at age 42 if you have a 30-pack-year history of smoking and currently smoke or have quit within the past 15 years. Fecal occult blood test (FOBT) of the stool. You may have this test every year starting at age 83. Flexible sigmoidoscopy or colonoscopy. You may have a sigmoidoscopy every 5 years or a colonoscopy every 10 years starting at age 90. Hepatitis C blood test. Hepatitis B blood test. Sexually transmitted disease (STD) testing. Diabetes screening. This is done by checking your blood sugar (glucose) after you have not eaten for a while (fasting). You may have this done every 1-3 years. Mammogram. This may be done every 1-2 years. Talk to your health care provider about when you should start having regular mammograms. This may depend on whether you have a family history of breast cancer. BRCA-related cancer screening. This may be done if you have a family history of breast, ovarian, tubal, or peritoneal cancers. Pelvic exam and Pap test. This may be done every 3 years starting at age 83. Starting at age 56, this may be done every 5 years if you have a Pap test in combination with an HPV test. Bone density scan. This is done to screen for osteoporosis. You may have this scan if you are at high risk for osteoporosis. Discuss your test results, treatment options, and if necessary, the need for more tests with your health care provider. Vaccines  Your health care provider may recommend certain vaccines, such as: Influenza vaccine. This is recommended every year. Tetanus, diphtheria, and acellular pertussis (Tdap, Td) vaccine. You may need a Td booster every 10 years. Zoster vaccine. You may need this after age 27. Pneumococcal 13-valent  conjugate (PCV13) vaccine. You may need this if you have certain conditions and were not previously vaccinated. Pneumococcal polysaccharide (PPSV23) vaccine. You may need one or two doses if you smoke cigarettes or if you have certain conditions. Talk to your health care provider about which screenings and vaccines you need and how often you need them. This information is not intended to replace advice given to you by your health care provider. Make sure you discuss any questions you have with your health care provider. Document Released: 09/29/2015 Document Revised: 05/22/2016 Document Reviewed: 07/04/2015 Elsevier Interactive Patient Education  2017 [place] Prevention in the Home Falls can cause injuries. They can happen to people of all ages. There are many things you  can do to make your home safe and to help prevent falls. What can I do on the outside of my home? Regularly fix the edges of walkways and driveways and fix any cracks. Remove anything that might make you trip as you walk through a door, such as a raised step or threshold. Trim any bushes or trees on the path to your home. Use bright outdoor lighting. Clear any walking paths of anything that might make someone trip, such as rocks or tools. Regularly check to see if handrails are loose or broken. Make sure that both sides of any steps have handrails. Any raised decks and porches should have guardrails on the edges. Have any leaves, snow, or ice cleared regularly. Use sand or salt on walking paths during winter. Clean up any spills in your garage right away. This includes oil or grease spills. What can I do in the bathroom? Use night lights. Install grab bars by the toilet and in the tub and shower. Do not use towel bars as grab bars. Use non-skid mats or decals in the tub or shower. If you need to sit down in the shower, use a plastic, non-slip stool. Keep the floor dry. Clean up any water that spills on the  floor as soon as it happens. Remove soap buildup in the tub or shower regularly. Attach bath mats securely with double-sided non-slip rug tape. Do not have throw rugs and other things on the floor that can make you trip. What can I do in the bedroom? Use night lights. Make sure that you have a light by your bed that is easy to reach. Do not use any sheets or blankets that are too big for your bed. They should not hang down onto the floor. Have a firm chair that has side arms. You can use this for support while you get dressed. Do not have throw rugs and other things on the floor that can make you trip. What can I do in the kitchen? Clean up any spills right away. Avoid walking on wet floors. Keep items that you use a lot in easy-to-reach places. If you need to reach something above you, use a strong step stool that has a grab bar. Keep electrical cords out of the way. Do not use floor polish or wax that makes floors slippery. If you must use wax, use non-skid floor wax. Do not have throw rugs and other things on the floor that can make you trip. What can I do with my stairs? Do not leave any items on the stairs. Make sure that there are handrails on both sides of the stairs and use them. Fix handrails that are broken or loose. Make sure that handrails are as long as the stairways. Check any carpeting to make sure that it is firmly attached to the stairs. Fix any carpet that is loose or worn. Avoid having throw rugs at the top or bottom of the stairs. If you do have throw rugs, attach them to the floor with carpet tape. Make sure that you have a light switch at the top of the stairs and the bottom of the stairs. If you do not have them, ask someone to add them for you. What else can I do to help prevent falls? Wear shoes that: Do not have high heels. Have rubber bottoms. Are comfortable and fit you well. Are closed at the toe. Do not wear sandals. If you use a stepladder: Make sure that  it is fully  opened. Do not climb a closed stepladder. Make sure that both sides of the stepladder are locked into place. Ask someone to hold it for you, if possible. Clearly mark and make sure that you can see: Any grab bars or handrails. First and last steps. Where the edge of each step is. Use tools that help you move around (mobility aids) if they are needed. These include: Canes. Walkers. Scooters. Crutches. Turn on the lights when you go into a dark area. Replace any light bulbs as soon as they burn out. Set up your furniture so you have a clear path. Avoid moving your furniture around. If any of your floors are uneven, fix them. If there are any pets around you, be aware of where they are. Review your medicines with your doctor. Some medicines can make you feel dizzy. This can increase your chance of falling. Ask your doctor what other things that you can do to help prevent falls. This information is not intended to replace advice given to you by your health care provider. Make sure you discuss any questions you have with your health care provider. Document Released: 06/29/2009 Document Revised: 02/08/2016 Document Reviewed: 10/07/2014 Elsevier Interactive Patient Education  2017 [employer].

## 2022-11-22 NOTE — Progress Notes (Signed)
I connected with  [name] on 11/22/22 by a audio enabled telemedicine application and verified that I am speaking with the correct person using two identifiers.  Patient Location: Home  Provider Location: Office/Clinic  I discussed the limitations of evaluation and management by telemedicine. The patient expressed understanding and agreed to proceed.  Subjective:   Monique [name] is a 52 y.o. female who presents for Medicare Annual (Subsequent) preventive examination.  Review of Systems     Cardiac Risk Factors include: sedentary lifestyle;smoking/ tobacco exposure     Objective:    Today's Vitals   11/22/22 1351  Weight: 186 lb (84.4 kg)  Height: '5\' 7"'$  (1.702 m)   Body mass index is 29.13 kg/m.     11/22/2022    2:04 PM 11/14/2022    9:11 AM 10/10/2022    8:56 AM 09/12/2022    9:37 AM 08/15/2022    9:09 AM 07/11/2022    8:44 AM 06/13/2022    9:08 AM  Advanced Directives  Does Patient Have a Medical Advance Directive? Yes Yes Yes Yes Yes Yes Yes  Type of Corporate treasurer of [place];Living will [place];Living will [place];Living will [place];Living will  [place];Living will  Does patient want to make changes to medical advance directive?   Yes (ED - Information included in AVS)      Copy of [place] in Chart?    Yes - validated most recent copy scanned in chart (See row information)     Would patient like information on creating a medical advance directive?   Yes (ED - Information included in AVS)        Current Medications (verified) Outpatient Encounter Medications as of 11/22/2022  Medication Sig   albuterol (VENTOLIN HFA) 108 (90 Base) MCG/ACT inhaler Inhale 2 puffs into the lungs every 6 (six) hours as needed for wheezing or shortness of breath.   ARIPiprazole (ABILIFY) 5 MG tablet Take 1 tablet (5 mg total) by mouth daily.   Ascorbic  Acid (VITAMIN C) 1000 MG tablet Take 1,000 mg by mouth daily as needed (immune health).   ASPIRIN 81 PO Take 81 mg by mouth daily.   atorvastatin (LIPITOR) 10 MG tablet Take 1 tablet (10 mg total) by mouth in the morning.   estrogens, conjugated, (PREMARIN) 0.625 MG tablet Take 1 tablet (0.625 mg total) by mouth daily. Take daily for 21 days then do not take for 7 days.   lenalidomide (REVLIMID) 10 MG capsule Take 1 capsule (10 mg total) by mouth daily.   loratadine (CLARITIN) 10 MG tablet Take 1 tablet (10 mg total) by mouth every morning.   Magnesium 500 MG CAPS Take 500 mg by mouth in the morning.   meloxicam (MOBIC) 15 MG tablet TAKE ONE TABLET BY MOUTH EVERY MORNING   omeprazole (PRILOSEC) 20 MG capsule Take 1 capsule (20 mg total) by mouth every morning.   predniSONE (DELTASONE) 5 MG tablet TAKE ONE TABLET BY MOUTH daily WITH BREAKFAST AFTER finishing THE 10 MG taper   pregabalin (LYRICA) 100 MG capsule Take 100 mg by mouth 3 (three) times daily.   promethazine-dextromethorphan (PROMETHAZINE-DM) 6.25-15 MG/5ML syrup Take 2.5-5 mLs by mouth 4 (four) times daily as needed for cough.   tiZANidine (ZANAFLEX) 2 MG tablet Take 1 tablet (2 mg total) by mouth at bedtime.   umeclidinium-vilanterol (ANORO ELLIPTA) 62.5-25 MCG/ACT AEPB Inhale 1 puff into the lungs daily.  vitamin B-12 (CYANOCOBALAMIN) 500 MCG tablet Take 250 mcg by mouth every morning.   Calcium Carbonate-Vit D-Min (GNP CALCIUM 1200) 1200-1000 MG-UNIT CHEW Chew 1,200 mg by mouth daily with breakfast. Take in combination with vitamin D and magnesium.   No facility-administered encounter medications on file as of 11/22/2022.    Allergies (verified) Doxycycline   History: Past Medical History:  Diagnosis Date   Anxiety    Bursitis    leg pain   Carpal tunnel syndrome    Cervical dysplasia    hx LEEP over 18 years ago.    Chronic lymphocytic leukemia ([place]) 2018   Dr Grayland Ormond.  (in lymph nodes)   COPD (chronic obstructive  pulmonary disease) ([place])    COVID-19 2021   Depression    Eating disorder    GERD (gastroesophageal reflux disease)    History of self-harm    Insomnia    Obsession    Tobacco abuse    Vitamin B12 deficiency (non anemic)    Past Surgical History:  Procedure Laterality Date   BREAST BIOPSY Right 01/05/2018   US guided biopsy of 2 areas and 1 lymph node, MIXED INFLAMMATION AND GIANT CELL REACTION   CERVICAL BIOPSY  W/ LOOP ELECTRODE EXCISION     COLONOSCOPY WITH PROPOFOL N/A 04/21/2019   Procedure: COLONOSCOPY WITH PROPOFOL;  Surgeon: Virgel Manifold, MD;  Location: [facility] ENDOSCOPY;  Service: Gastroenterology;  Laterality: N/A;   OTHER SURGICAL HISTORY     scar tissue removed from vocal cords   TUBAL LIGATION     VAGINAL HYSTERECTOMY N/A 01/22/2021   Procedure: HYSTERECTOMY VAGINAL; BILATERAL SALPINGECTOMY;  Surgeon: Rubie Maid, MD;  Location: [facility] ORS;  Service: Gynecology;  Laterality: N/A;   vocal cord surgery  2005   Family History  Problem Relation Age of Onset   Depression Mother    Cancer Mother        thyroid   Alcohol abuse Father    COPD Father    Alcohol abuse Brother    Depression Brother    Bipolar disorder Brother    Suicidality Brother    ADD / ADHD Son    Breast cancer Neg Hx    Social History   Socioeconomic History   Marital status: Divorced    Spouse name: NA   Number of children: 4   Years of education: 12   Highest education level: 12th grade  Occupational History   Occupation: Disabled   Tobacco Use   Smoking status: Every Day    Packs/day: 1.00    Years: 31.00    Total pack years: 31.00    Types: Cigarettes    Start date: 09/25/1986   Smokeless tobacco: Never  Vaping Use   Vaping Use: Former  Substance and Sexual Activity   Alcohol use: Not Currently    Alcohol/week: 0.0 standard drinks of alcohol    Comment: rarely   Drug use: Yes    Frequency: 7.0 times per week    Types: Marijuana   Sexual activity: Yes    Partners: Male     Birth control/protection: Surgical  Other Topics Concern   Not on file  Social History Narrative   Patient is single Patient is currently on social security disability due to health challenges.     Her teenager son is living with his father    Her father lives in [place] and is on end stage COPD   Social Determinants of Health   Financial Resource Strain: Low Risk  (11/22/2022)  Overall Financial Resource Strain (CARDIA)    Difficulty of Paying Living Expenses: Not hard at all  Food Insecurity: No Food Insecurity (11/22/2022)   Hunger Vital Sign    Worried About Running Out of Food in the Last Year: Never true    Ran Out of Food in the Last Year: Never true  Transportation Needs: No Transportation Needs (11/22/2022)   PRAPARE - Hydrologist (Medical): No    Lack of Transportation (Non-Medical): No  Physical Activity: Inactive (11/22/2022)   Exercise Vital Sign    Days of Exercise per Week: 0 days    Minutes of Exercise per Session: 0 min  Stress: Stress Concern Present (11/22/2022)   [place]    Feeling of Stress : Very much  Social Connections: Socially Isolated (11/22/2022)   Social Connection and Isolation Panel [NHANES]    Frequency of Communication with Friends and Family: More than three times a week    Frequency of Social Gatherings with Friends and Family: Twice a week    Attends Religious Services: Never    Marine scientist or Organizations: No    Attends Music therapist: Not on file    Marital Status: Divorced    Tobacco Counseling Ready to quit: Not Answered Counseling given: Not Answered   Clinical Intake:  Pre-visit preparation completed: Yes  Pain : No/denies pain     BMI - recorded: 29.13 Nutritional Status: BMI 25 -29 Overweight Nutritional Risks: None Diabetes: No  How often do you need to have someone help you when you read instructions,  pamphlets, or other written materials from your doctor or pharmacy?: 1 - Never  Diabetic?no  Interpreter Needed?: No  Information entered by :: B.Czarina Gingras,LPN   Activities of Daily Living    11/22/2022    2:05 PM 11/12/2022    8:24 AM  In your present state of health, do you have any difficulty performing the following activities:  Hearing? 0 0  Vision? 0 0  Difficulty concentrating or making decisions? 0 0  Walking or climbing stairs? 1 1  Dressing or bathing? 0 0  Doing errands, shopping? 0 0  Preparing Food and eating ? N   Using the Toilet? N   In the past six months, have you accidently leaked urine? Y   Do you have problems with loss of bowel control? N   Managing your Medications? N   Managing your Finances? N   Housekeeping or managing your Housekeeping? N     Patient Care Team: [name], MD as PCP - General (Family Medicine) Lloyd Huger, MD as Consulting Physician (Oncology) Lane Hacker, [facility] as Pharmacist (Pharmacist) Greg Cutter, LCSW as [place] Management (Licensed Clinical Social Worker) Gloris Ham, RN as Registered Nurse (Oncology) Borders, Kirt Boys, NP as Nurse Practitioner (Hospice and Palliative Medicine)  Indicate any recent Medical Services you may have received from other than [facility] providers in the past year (date may be approximate).     Assessment:   This is a routine wellness examination for [place].  Hearing/Vision screen Hearing Screening - Comments:: Adequate hearing Vision Screening - Comments:: Adequate vision w/glasses [facility]  Dietary issues and exercise activities discussed:     Goals Addressed             This Visit's Progress    Manage My Emotions   Not on track    [facility]  Dasmine Sayson is a 52 y.o. year old female who sees [name], MD for primary care. The  [facility] Managed Care team was consulted for assistance with Mental Health Concerns . Ms. Jarosinski was given  information about Care Management services, agreed to services, and verbal consent for services was obtained.  Timeframe:  Long-Range Goal Priority:  Medium Start Date:   01/04/21                      Expected End Date:  03/06/21                    Follow Up Date - 01/09/21   - begin personal counseling - call and visit an old friend - check out volunteer opportunities - join a support group - laugh; watch a funny movie or comedian - learn and use visualization or guided imagery - perform a random act of kindness - practice relaxation or meditation daily - start or continue a personal journal - talk about feelings with a friend, family or spiritual advisor - practice positive thinking and self-talk    Why is this important?   When you are stressed, down or upset, your body reacts too.  For example, your blood pressure may get higher; you may have a headache or stomachache.  When your emotions get the best of you, your body's ability to fight off cold and flu gets weak.  These steps will help you manage your emotions.     Current Barriers:  Chronic Mental Health needs related to Anxiety and Depression Limited social support, ADL IADL limitations, Mental Health Concerns , and Social Isolation Suicidal Ideation/Homicidal Ideation: No  Clinical Social Work Goal(s):  Over the next 120 days, patient will work with SW monthly by telephone or in person to reduce or manage symptoms related to anxiety and depression patient will work with CCM LCSW to address needs related to finding a long term therapist  patient will experience decrease in ED visits as evidenced by patient report and review of EMR. ED visits in last 6 months. patient will attend all scheduled medical appointments as evidenced by patient report and care team review of appointment completion in EMR.   Interventions: Patient interviewed and appropriate assessments performed: brief mental health assessment and PHQ 2 and 9  completed.  Patient interviewed and appropriate assessments performed Provided mental health counseling with regard to mental health management. Patient is experiencing ongoing depression and anxiety and is having a difficult time finding a mental health provider that will accept her for services. Patient was informed that [facility] declined her referral on 12/11/20. Patient has no current psychiatrist or counselor but reports that her PCP is agreeable to keep prescribing her psychotropic medications. Patient reports that she worked previously with a Milton Ferguson at the [facility] Department and wishes for CCM LCSW to contact her to ask if she would consider seeing her again. Update 01/10/21- Patient confirmed that she has her evaluation and counseling appointment with Milton Ferguson on 01/17/21. She reports that this appointment is in person and confirms stable transportation to and from this.  Provided patient with information about available mental health providers that accept Medicaid such as [facility] [phone], [facility] [phone],  [facility] [phone], [place], [facility] [phone] and [facility] [phone]. Patient wishes to gain therapy with her previous provider. CCM LCSW contacted Estill Bamberg and spoke with her directly. She is agreeable to  start talk therapy again with patient and ask that patient contact her to get on her schedule. CCM LCSW completed return call back to patient and provided update and scheduled follow up appointment. Patient is agreeable to contact Estill Bamberg to schedule evaluation and initiate services. [facility] LCSW confirmed with patient that appointment has been set for 01/17/21.  Discussed plans with patient for ongoing care management follow up and provided patient with direct contact information for care management team LCSW discussed coping skills for anxiety. SW used  empathetic and active and reflective listening, validated patient's feelings/concerns, and provided emotional support. LCSW provided self-care examples to help patient manage their multiple health conditions and improve her mood.  Assisted patient/caregiver with obtaining information about health plan benefits Provided education and assistance to client regarding Advanced Directives. Emotional Supportive Provided, Problem Solving /Task Center , Psychoeducation /Health Education, and Motivational Interviewing  Patient Self Care Activities:  Attends all scheduled provider appointments Ability for insight Independent living Motivation for treatment  Patient Coping Strengths:  Hopefulness Self Advocate Able to Communicate Effectively  Patient Self Care Deficits:  Lacks social connections     Manage My Medicine   On track    Timeframe:  Short-Term Goal Priority:  High Start Date:                             Expected End Date:                       Follow Up Date Monthly   - call for medicine refill 2 or 3 days before it runs out - call if I am sick and can't take my medicine    Why is this important?   These steps will help you keep on track with your medicines.   Notes: Patient having trouble getting refills from Rx, will call me if more issues arise       Depression Screen    11/22/2022    1:58 PM 11/12/2022    8:24 AM 11/05/2022   11:40 AM 10/15/2022    8:45 AM 08/13/2022    1:38 PM 04/15/2022    8:50 AM 03/08/2022   10:35 AM  PHQ 2/9 Scores  PHQ - 2 Score 0 0 0 0 0 0 0  PHQ- 9 Score 0 0 0 0  3     Fall Risk    11/22/2022    1:53 PM 11/12/2022    8:23 AM 11/05/2022   11:40 AM 10/15/2022    8:45 AM 08/13/2022    1:37 PM  Fall Risk   Falls in the past year? 1 0 0 0 0  Number falls in past yr: 0  0 0 0  Injury with Fall? 1  0 0 0  Risk for fall due to : No Fall Risks No Fall Risks No Fall Risks No Fall Risks   Follow up Education provided;Falls prevention discussed  Falls prevention discussed Falls prevention discussed;Education provided;Falls evaluation completed Falls prevention discussed Falls evaluation completed    FALL RISK PREVENTION PERTAINING TO THE HOME:  Any stairs in or around the home? Yes  If so, are there any without handrails? Yes  Home free of loose throw rugs in walkways, pet beds, electrical cords, etc? Yes  Adequate lighting in your home to reduce risk of falls? Yes   ASSISTIVE DEVICES UTILIZED TO PREVENT FALLS:  Life alert? No  Use of a cane, walker or  w/c? Yes cane at times Grab bars in the bathroom? No  Shower chair or bench in shower? No  Elevated toilet seat or a handicapped toilet? No   Cognitive Function:        11/22/2022    2:12 PM  6CIT Screen  What Year? 0 points  What month? 0 points  What time? 0 points  Count back from 20 0 points  Months in reverse 0 points  Repeat phrase 0 points  Total Score 0 points   Immunizations Immunization History  Administered Date(s) Administered   Influenza,inj,Quad PF,6+ Mos 07/07/2014, 07/09/2019, 10/26/2020, 06/13/2021, 10/15/2022   Influenza-Unspecified 07/07/2014   Pneumococcal Polysaccharide-23 07/07/2014   Tdap 05/04/2014, 05/19/2021   Zoster Recombinat (Shingrix) 10/26/2020, 10/15/2022    TDAP status: Up to date  Flu Vaccine status: Up to date  Pneumococcal vaccine status: Up to date  Covid-19 vaccine status: Declined, Education has been provided regarding the importance of this vaccine but patient still declined. Advised may receive this vaccine at local pharmacy or Health Dept.or vaccine clinic. Aware to provide a copy of the vaccination record if obtained from local pharmacy or Health Dept. Verbalized acceptance and understanding.  Qualifies for Shingles Vaccine? Yes   Zostavax completed No   Shingrix Completed?: No.    Education has been provided regarding the importance of this vaccine. Patient has been advised to call insurance company to determine out  of pocket expense if they have not yet received this vaccine. Advised may also receive vaccine at local pharmacy or Health Dept. Verbalized acceptance and understanding.  Screening Tests Health Maintenance  Topic Date Due   MAMMOGRAM  12/14/2022   Lung Cancer Screening  08/01/2023   PAP SMEAR-Modifier  10/27/2023   Medicare Annual Wellness (AWV)  11/22/2023   COLONOSCOPY (Pts 45-22yr Insurance coverage will need to be confirmed)  04/20/2029   DTaP/Tdap/Td (3 - Td or Tdap) 05/20/2031   INFLUENZA VACCINE  Completed   Hepatitis C Screening  Completed   HIV Screening  Completed   Zoster Vaccines- Shingrix  Completed   HPV VACCINES  Aged Out   COVID-19 Vaccine  Discontinued    Health Maintenance  There are no preventive care reminders to display for this patient.   Colorectal cancer screening: Type of screening: Colonoscopy. Completed yes. Repeat every 5 years  Mammogram status: Completed yes. Repeat every year Next April 1st 2024  Lung Cancer Screening: (Low Dose CT Chest recommended if Age 778-80years, 30 pack-year currently smoking OR have quit w/in 15years.) does not qualify.   Lung Cancer Screening Referral: no  Additional Screening:  Hepatitis C Screening: does not qualify; Completed yes  Vision Screening: Recommended annual ophthalmology exams for early detection of glaucoma and other disorders of the eye. Is the patient up to date with their annual eye exam?  Yes  Who is the provider or what is the name of the office in which the patient attends annual eye exams? [facility] If pt is not established with a provider, would they like to be referred to a provider to establish care? No .   Dental Screening: Recommended annual dental exams for proper oral hygiene  Community Resource Referral / Chronic Care Management: CRR required this visit?  No   CCM required this visit?  No      Plan:     I have personally reviewed and noted the following in the patient's  chart:   Medical and social history Use of alcohol, tobacco or illicit drugs  Current medications and supplements including opioid prescriptions. Patient is not currently taking opioid prescriptions. Functional ability and status Nutritional status Physical activity Advanced directives List of other physicians Hospitalizations, surgeries, and ER visits in previous 12 months Vitals Screenings to include cognitive, depression, and falls Referrals and appointments  In addition, I have reviewed and discussed with patient certain preventive protocols, quality metrics, and best practice recommendations. A written personalized care plan for preventive services as well as general preventive health recommendations were provided to patient.     Roger Shelter, LPN   [id number]   Nurse Notes: pt states she is doing alright but dealing with stress/worry of a recent terminal prognosis 39month for her father. Pt states she dealing with it and declines any needs (counseling, etc) at this rime.

## 2022-11-25 ENCOUNTER — Other Ambulatory Visit (HOSPITAL_COMMUNITY)
Admission: RE | Admit: 2022-11-25 | Discharge: 2022-11-25 | Disposition: A | Discharge status: Home / Self Care | Payer: 59 | Source: Ambulatory Visit | Attending: Physician Assistant | Admitting: Physician Assistant

## 2022-11-25 ENCOUNTER — Encounter: Payer: Self-pay | Admitting: Physician Assistant

## 2022-11-25 ENCOUNTER — Ambulatory Visit (INDEPENDENT_AMBULATORY_CARE_PROVIDER_SITE_OTHER): Payer: 59 | Admitting: Physician Assistant

## 2022-11-25 ENCOUNTER — Other Ambulatory Visit: Payer: Self-pay

## 2022-11-25 VITALS — BP 120/70 | HR 93 | Temp 97.8°F | Resp 16 | Ht 67.0 in | Wt 187.1 lb

## 2022-11-25 DIAGNOSIS — B3731 Acute candidiasis of vulva and vagina: Secondary | ICD-10-CM

## 2022-11-25 DIAGNOSIS — R102 Pelvic and perineal pain: Secondary | ICD-10-CM

## 2022-11-25 MED ORDER — FLUCONAZOLE 150 MG PO TABS: 150.0000 mg | ORAL_TABLET | ORAL | 0 refills | Status: DC | PRN

## 2022-11-25 NOTE — Progress Notes (Signed)
Acute Office Visit   Patient: Monique [name]   DOB: 01-May-1971   52 y.o. Female  MRN: [id number] Visit Date: 11/25/2022  Today's healthcare provider: Dani Gobble Zahriyah Joo, PA-C  Introduced myself to the patient as a Journalist, newspaper and provided education on APPs in clinical practice.    Chief Complaint  Patient presents with   Vaginal Pain    Labia swollen, painful, blisters for 2 days   Vaginal Discharge   Subjective    Vaginal Pain The patient's primary symptoms include vaginal discharge. Pertinent negatives include no abdominal pain, chills or fever.  Vaginal Discharge The patient's primary symptoms include vaginal discharge. Pertinent negatives include no abdominal pain, chills or fever.   HPI     Vaginal Pain    Additional comments: Labia swollen, painful, blisters for 2 days      Last edited by Hollie Salk, RMA on 11/25/2022 10:57 AM.       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   ARIPiprazole (ABILIFY) 5 MG tablet Take 1 tablet (5 mg total) by mouth daily.   Ascorbic Acid (VITAMIN C) 1000 MG  tablet Take 1,000 mg by mouth daily as needed (immune health).   ASPIRIN 81 PO Take 81 mg by mouth daily.   atorvastatin (LIPITOR) 10 MG tablet Take 1 tablet (10 mg total) by mouth in the morning.   estrogens, conjugated, (PREMARIN) 0.625 MG tablet Take 1 tablet (0.625 mg total) by mouth daily. Take daily for 21 days then do not take for 7 days.   lenalidomide (REVLIMID) 10 MG capsule Take 1 capsule (10 mg total) by mouth daily.   loratadine (CLARITIN) 10 MG tablet Take 1 tablet (10 mg total) by mouth every morning.   Magnesium 500 MG CAPS Take 500 mg by mouth in the morning.   meloxicam (MOBIC) 15 MG tablet TAKE ONE TABLET BY MOUTH EVERY MORNING   omeprazole (PRILOSEC) 20 MG capsule Take 1 capsule (20 mg total) by mouth every morning.   predniSONE (DELTASONE) 5 MG tablet TAKE ONE TABLET BY MOUTH daily WITH BREAKFAST AFTER finishing THE 10 MG taper   pregabalin (LYRICA) 100 MG capsule Take 100 mg by mouth 3 (three) times daily.   promethazine-dextromethorphan (PROMETHAZINE-DM) 6.25-15 MG/5ML syrup Take 2.5-5 mLs by mouth 4 (four) times daily as needed for cough.   tiZANidine (ZANAFLEX) 2 MG tablet Take 1 tablet (2 mg total) by mouth at bedtime.   umeclidinium-vilanterol (ANORO ELLIPTA) 62.5-25 MCG/ACT AEPB Inhale 1 puff into the lungs daily.  vitamin B-12 (CYANOCOBALAMIN) 500 MCG tablet Take 250 mcg by mouth every morning.   Calcium Carbonate-Vit D-Min (GNP CALCIUM 1200) 1200-1000 MG-UNIT CHEW Chew 1,200 mg by mouth daily with breakfast. Take in combination with vitamin D and magnesium.   No facility-administered medications prior to visit.    Review of Systems  Constitutional:  Negative for chills, fatigue and fever.  Gastrointestinal:  Negative for abdominal pain.  Genitourinary:  Positive for vaginal discharge and vaginal pain.       Objective    BP 120/70   Pulse 93   Temp 97.8 F (36.6 C) (Oral)   Resp 16   Ht '5\' 7"'$  (1.702 m)   Wt 187 lb 1.6 oz (84.9 kg)   LMP 08/17/2016    SpO2 96%   BMI 29.30 kg/m    Physical Exam Vitals reviewed. Exam conducted with a chaperone present.  Constitutional:      General: She is awake.     Appearance: Normal appearance. She is well-developed and well-groomed.  HENT:     Head: Normocephalic and atraumatic.  Eyes:     Extraocular Movements: Extraocular movements intact.     Conjunctiva/sclera: Conjunctivae normal.  Pulmonary:     Effort: Pulmonary effort is normal.  Genitourinary:    Labia:        Right: Rash present.        Left: Rash present.      Vagina: No signs of injury and foreign body. Vaginal discharge, erythema and tenderness present. No bleeding, lesions or prolapsed vaginal walls.  Musculoskeletal:     Cervical back: Normal range of motion.  Skin:    General: Skin is warm and dry.  Neurological:     General: No focal deficit present.     Mental Status: She is alert and oriented to person, place, and time.  Psychiatric:        Mood and Affect: Mood normal.        Behavior: Behavior normal. Behavior is cooperative.        Thought Content: Thought content normal.        Judgment: Judgment normal.       No results found for any visits on 11/25/22.  Assessment & Plan      No follow-ups on file.       Problem List Items Addressed This Visit   None Visit Diagnoses     Vaginal yeast infection    -  Primary Acute, new concern She reports genital discomfort, pain and redness starting 2 days ago and not improving despite use of Monostat Will send in cervicovaginal swab for testing for BV, trich, yeast, gonorrhea, chlamydia  Rash in genital area appears most consistent with yeast infection at this time so will send in Diflucan to start therapy Follow up as needed Further management to be dictated by results    Relevant Medications   fluconazole (DIFLUCAN) 150 MG tablet   Vaginal pain       Relevant Orders   Cervicovaginal ancillary only        No follow-ups on file.   I, Clemens Lachman E Phyliss Hulick,  PA-C, have reviewed all documentation for this visit. The documentation on 11/25/22 for the exam, diagnosis, procedures, and orders are all accurate and complete.   Talitha Givens, MHS, PA-C [facility]

## 2022-11-26 LAB — CERVICOVAGINAL ANCILLARY ONLY
Comment: NEGATIVE
Comment: NEGATIVE
Comment: NEGATIVE
Comment: NEGATIVE
Comment: NEGATIVE
Comment: NORMAL

## 2022-11-29 NOTE — Progress Notes (Signed)
The results of your swab have returned and it does not appear as though there was enough of a sample for the lab to do the testing. Given the results of your exam I think we should proceed to treat as a yeast infection. If you continue to have symptoms after the Diflucan please let us know and we can repeat the swab with a pelvic exam.

## 2022-12-05 ENCOUNTER — Ambulatory Visit: Payer: 59

## 2022-12-05 ENCOUNTER — Other Ambulatory Visit: Payer: 59

## 2022-12-05 ENCOUNTER — Ambulatory Visit: Payer: 59 | Admitting: Oncology

## 2022-12-06 ENCOUNTER — Other Ambulatory Visit: Payer: Self-pay | Admitting: *Deleted

## 2022-12-06 DIAGNOSIS — C9110 Chronic lymphocytic leukemia of B-cell type not having achieved remission: Secondary | ICD-10-CM

## 2022-12-06 MED ORDER — LENALIDOMIDE 10 MG PO CAPS: 10.0000 mg | ORAL_CAPSULE | Freq: Every day | ORAL | 0 refills | Status: DC

## 2022-12-09 ENCOUNTER — Ambulatory Visit
Admission: RE | Admit: 2022-12-09 | Discharge: 2022-12-09 | Disposition: A | Discharge status: Home / Self Care | Payer: 59 | Source: Ambulatory Visit | Attending: Oncology | Admitting: Oncology

## 2022-12-09 DIAGNOSIS — R590 Localized enlarged lymph nodes: Secondary | ICD-10-CM | POA: Diagnosis not present

## 2022-12-09 DIAGNOSIS — J439 Emphysema, unspecified: Secondary | ICD-10-CM | POA: Diagnosis not present

## 2022-12-09 DIAGNOSIS — C9110 Chronic lymphocytic leukemia of B-cell type not having achieved remission: Secondary | ICD-10-CM | POA: Insufficient documentation

## 2022-12-09 DIAGNOSIS — K760 Fatty (change of) liver, not elsewhere classified: Secondary | ICD-10-CM | POA: Diagnosis not present

## 2022-12-09 MED ORDER — IOHEXOL 300 MG/ML  SOLN
100.0000 mL | Freq: Once | INTRAMUSCULAR | Status: AC | PRN
  Administered 2022-12-09: 100 mL via INTRAVENOUS

## 2022-12-11 MED FILL — Dexamethasone Sodium Phosphate Inj 100 MG/10ML: INTRAMUSCULAR | Qty: 2 | Status: AC

## 2022-12-12 ENCOUNTER — Inpatient Hospital Stay (HOSPITAL_BASED_OUTPATIENT_CLINIC_OR_DEPARTMENT_OTHER): Payer: 59 | Admitting: Oncology

## 2022-12-12 ENCOUNTER — Inpatient Hospital Stay: Payer: 59 | Attending: Oncology

## 2022-12-12 ENCOUNTER — Inpatient Hospital Stay: Payer: 59

## 2022-12-12 ENCOUNTER — Encounter: Payer: Self-pay | Admitting: Oncology

## 2022-12-12 DIAGNOSIS — Z5112 Encounter for antineoplastic immunotherapy: Secondary | ICD-10-CM | POA: Insufficient documentation

## 2022-12-12 DIAGNOSIS — C9110 Chronic lymphocytic leukemia of B-cell type not having achieved remission: Secondary | ICD-10-CM

## 2022-12-12 LAB — CBC WITH DIFFERENTIAL/PLATELET
Abs Immature Granulocytes: 0.03 10*3/uL (ref 0.00–0.07)
Basophils Absolute: 0.1 10*3/uL (ref 0.0–0.1)
Basophils Relative: 1 %
Eosinophils Absolute: 0.2 10*3/uL (ref 0.0–0.5)
Eosinophils Relative: 6 %
HCT: 41 % (ref 36.0–46.0)
Hemoglobin: 13.9 g/dL (ref 12.0–15.0)
Immature Granulocytes: 1 %
Lymphocytes Relative: 39 %
Lymphs Abs: 1.7 10*3/uL (ref 0.7–4.0)
MCH: 34.5 pg — ABNORMAL HIGH (ref 26.0–34.0)
MCHC: 33.9 g/dL (ref 30.0–36.0)
MCV: 101.7 fL — ABNORMAL HIGH (ref 80.0–100.0)
Monocytes Absolute: 0.6 10*3/uL (ref 0.1–1.0)
Monocytes Relative: 13 %
Neutro Abs: 1.8 10*3/uL (ref 1.7–7.7)
Neutrophils Relative %: 40 %
Platelets: 127 10*3/uL — ABNORMAL LOW (ref 150–400)
RBC: 4.03 MIL/uL (ref 3.87–5.11)
RDW: 13.7 % (ref 11.5–15.5)
Smear Review: NORMAL
WBC: 4.4 10*3/uL (ref 4.0–10.5)
nRBC: 0 % (ref 0.0–0.2)

## 2022-12-12 LAB — COMPREHENSIVE METABOLIC PANEL
ALT: 18 U/L (ref 0–44)
AST: 18 U/L (ref 15–41)
Albumin: 3.9 g/dL (ref 3.5–5.0)
Alkaline Phosphatase: 70 U/L (ref 38–126)
Anion gap: 8 (ref 5–15)
BUN: 16 mg/dL (ref 6–20)
CO2: 26 mmol/L (ref 22–32)
Calcium: 8.7 mg/dL — ABNORMAL LOW (ref 8.9–10.3)
Chloride: 105 mmol/L (ref 98–111)
Creatinine, Ser: 1.11 mg/dL — ABNORMAL HIGH (ref 0.44–1.00)
GFR, Estimated: 59 mL/min — ABNORMAL LOW (ref >60)
Glucose, Bld: 100 mg/dL — ABNORMAL HIGH (ref 70–99)
Potassium: 3.6 mmol/L (ref 3.5–5.1)
Sodium: 139 mmol/L (ref 135–145)
Total Bilirubin: 0.8 mg/dL (ref 0.3–1.2)
Total Protein: 6.2 g/dL — ABNORMAL LOW (ref 6.5–8.1)

## 2022-12-12 MED ORDER — SODIUM CHLORIDE 0.9 % IV SOLN
20.0000 mg | Freq: Once | INTRAVENOUS | Status: AC
  Administered 2022-12-12: 20 mg via INTRAVENOUS
  Filled 2022-12-12: qty 20

## 2022-12-12 MED ORDER — SODIUM CHLORIDE 0.9 % IV SOLN
375.0000 mg/m2 | Freq: Once | INTRAVENOUS | Status: AC
  Administered 2022-12-12: 700 mg via INTRAVENOUS
  Filled 2022-12-12: qty 50

## 2022-12-12 MED ORDER — HEPARIN SOD (PORK) LOCK FLUSH 100 UNIT/ML IV SOLN
500.0000 [IU] | Freq: Once | INTRAVENOUS | Status: DC | PRN
  Filled 2022-12-12: qty 5

## 2022-12-12 MED ORDER — DIPHENHYDRAMINE HCL 25 MG PO CAPS
50.0000 mg | ORAL_CAPSULE | Freq: Once | ORAL | Status: AC
  Administered 2022-12-12: 50 mg via ORAL
  Filled 2022-12-12: qty 2

## 2022-12-12 MED ORDER — ACETAMINOPHEN 325 MG PO TABS
650.0000 mg | ORAL_TABLET | Freq: Once | ORAL | Status: AC
  Administered 2022-12-12: 650 mg via ORAL
  Filled 2022-12-12: qty 2

## 2022-12-12 MED ORDER — FAMOTIDINE IN NACL 20-0.9 MG/50ML-% IV SOLN
20.0000 mg | Freq: Once | INTRAVENOUS | Status: AC
  Administered 2022-12-12: 20 mg via INTRAVENOUS
  Filled 2022-12-12: qty 50

## 2022-12-12 MED ORDER — SODIUM CHLORIDE 0.9 % IV SOLN
Freq: Once | INTRAVENOUS | Status: AC
  Filled 2022-12-12: qty 250

## 2022-12-12 NOTE — Progress Notes (Signed)
Pt waited for 15 mins for the waiting period before starting the treatment. Pt. Didn't want to wait the 30 mins.

## 2022-12-12 NOTE — Progress Notes (Signed)
[facility]  [phone] [phone]) [phone]  ID: Monique [name] OB: [birth year]  MR#: [id number]  [id number]  Patient Care Team: [name], MD as PCP - General (Family Medicine) Lloyd Huger, MD as Consulting Physician (Oncology) Lane Hacker, [facility] as Pharmacist (Pharmacist) Greg Cutter, LCSW as [place] [employer] (Licensed Clinical Social Worker) Gloris Ham, RN as Registered Nurse (Oncology) Borders, Kirt Boys, NP as Nurse Practitioner (Hospice and Palliative Medicine)  CHIEF COMPLAINT: CLL.  INTERVAL HISTORY: Patient returns to clinic today for further evaluation, discussion of her imaging results, and continuation of Rituxan and Revlimid.  Her only complaint today is that of persistent weight gain, but she otherwise feels well.  She has a chronic peripheral neuropathy.  She is tolerating her treatments without significant side effects.  She has no other neurologic complaints.  She has a good appetite.  She has no chest pain, shortness of breath, cough, or hemoptysis.  She denies any nausea, vomiting, or constipation.  She has no melena or hematochezia.  She has no urinary complaints.  Patient offers no further specific complaints today.  REVIEW OF SYSTEMS:   Review of Systems  Constitutional: Negative.  Negative for diaphoresis, fever, malaise/fatigue and weight loss.  HENT:  Negative for congestion.   Respiratory: Negative.  Negative for cough and shortness of breath.   Cardiovascular: Negative.  Negative for chest pain and leg swelling.  Gastrointestinal: Negative.  Negative for abdominal pain, constipation and diarrhea.  Genitourinary: Negative.  Negative for dysuria and frequency.  Musculoskeletal: Negative.  Negative for back pain, myalgias and neck pain.  Skin: Negative.  Negative for rash.  Neurological:  Positive for tingling and sensory change. Negative for focal weakness and weakness.   Psychiatric/Behavioral: Negative.  Negative for depression and suicidal ideas. The patient is not nervous/anxious.     As per HPI. Otherwise, a complete review of systems is negative.  PAST MEDICAL HISTORY: Past Medical History:  Diagnosis Date   Anxiety    Bursitis    leg pain   Carpal tunnel syndrome    Cervical dysplasia    hx LEEP over 18 years ago.    Chronic lymphocytic leukemia ([place]) 2018   Dr Grayland Ormond.  (in lymph nodes)   COPD (chronic obstructive pulmonary disease) ([place])    COVID-19 2021   Depression    Eating disorder    GERD (gastroesophageal reflux disease)    History of self-harm    Insomnia    Obsession    Tobacco abuse    Vitamin B12 deficiency (non anemic)     PAST SURGICAL HISTORY: Past Surgical History:  Procedure Laterality Date   BREAST BIOPSY Right 01/05/2018   US guided biopsy of 2 areas and 1 lymph node, MIXED INFLAMMATION AND GIANT CELL REACTION   CERVICAL BIOPSY  W/ LOOP ELECTRODE EXCISION     COLONOSCOPY WITH PROPOFOL N/A 04/21/2019   Procedure: COLONOSCOPY WITH PROPOFOL;  Surgeon: Virgel Manifold, MD;  Location: [facility] ENDOSCOPY;  Service: Gastroenterology;  Laterality: N/A;   OTHER SURGICAL HISTORY     scar tissue removed from vocal cords   TUBAL LIGATION     VAGINAL HYSTERECTOMY N/A 01/22/2021   Procedure: HYSTERECTOMY VAGINAL; BILATERAL SALPINGECTOMY;  Surgeon: Rubie Maid, MD;  Location: [facility] ORS;  Service: Gynecology;  Laterality: N/A;   vocal cord surgery  2005    FAMILY HISTORY: Family History  Problem Relation Age of Onset   Depression Mother    Cancer Mother  thyroid   Alcohol abuse Father    COPD Father    Alcohol abuse Brother    Depression Brother    Bipolar disorder Brother    Suicidality Brother    ADD / ADHD Son    Breast cancer Neg Hx     ADVANCED DIRECTIVES (Y/N):  N  HEALTH MAINTENANCE: Social History   Tobacco Use   Smoking status: Every Day    Packs/day: 1.00    Years: 31.00    Additional pack  years: 0.00    Total pack years: 31.00    Types: Cigarettes    Start date: 09/25/1986   Smokeless tobacco: Never  Vaping Use   Vaping Use: Former  Substance Use Topics   Alcohol use: Not Currently    Alcohol/week: 0.0 standard drinks of alcohol    Comment: rarely   Drug use: Yes    Frequency: 7.0 times per week    Types: Marijuana     Colonoscopy:  PAP:  Bone density:  Lipid panel:  Allergies  Allergen Reactions   Doxycycline Rash    Rash on cheeks and sides of nose     Current Outpatient Medications  Medication Sig Dispense Refill   albuterol (VENTOLIN HFA) 108 (90 Base) MCG/ACT inhaler Inhale 2 puffs into the lungs every 6 (six) hours as needed for wheezing or shortness of breath. 8 g 0   ARIPiprazole (ABILIFY) 5 MG tablet Take 1 tablet (5 mg total) by mouth daily. 90 tablet 1   Ascorbic Acid (VITAMIN C) 1000 MG tablet Take 1,000 mg by mouth daily as needed (immune health).     ASPIRIN 81 PO Take 81 mg by mouth daily.     atorvastatin (LIPITOR) 10 MG tablet Take 1 tablet (10 mg total) by mouth in the morning. 90 tablet 1   estrogens, conjugated, (PREMARIN) 0.625 MG tablet Take 1 tablet (0.625 mg total) by mouth daily. Take daily for 21 days then do not take for 7 days. 90 tablet 3   lenalidomide (REVLIMID) 10 MG capsule Take 1 capsule (10 mg total) by mouth daily. 28 capsule 0   loratadine (CLARITIN) 10 MG tablet Take 1 tablet (10 mg total) by mouth every morning. 90 tablet 1   Magnesium 500 MG CAPS Take 500 mg by mouth in the morning.     meloxicam (MOBIC) 15 MG tablet TAKE ONE TABLET BY MOUTH EVERY MORNING 90 tablet 1   omeprazole (PRILOSEC) 20 MG capsule Take 1 capsule (20 mg total) by mouth every morning. 90 capsule 1   predniSONE (DELTASONE) 5 MG tablet TAKE ONE TABLET BY MOUTH daily WITH BREAKFAST AFTER finishing THE 10 MG taper 30 tablet 2   pregabalin (LYRICA) 100 MG capsule Take 100 mg by mouth 3 (three) times daily.     tiZANidine (ZANAFLEX) 2 MG tablet Take 1  tablet (2 mg total) by mouth at bedtime. 90 tablet 1   umeclidinium-vilanterol (ANORO ELLIPTA) 62.5-25 MCG/ACT AEPB Inhale 1 puff into the lungs daily. 60 each 5   vitamin B-12 (CYANOCOBALAMIN) 500 MCG tablet Take 250 mcg by mouth every morning.     Calcium Carbonate-Vit D-Min (GNP CALCIUM 1200) 1200-1000 MG-UNIT CHEW Chew 1,200 mg by mouth daily with breakfast. Take in combination with vitamin D and magnesium. 90 tablet 3   No current facility-administered medications for this visit.   Facility-Administered Medications Ordered in Other Visits  Medication Dose Route Frequency Provider Last Rate Last Admin   heparin lock flush 100 unit/mL  500 Units  Intracatheter Once PRN Lloyd Huger, MD        OBJECTIVE: Vitals:   12/12/22 0849  BP: (!) 106/42  Pulse: 72  Resp: 16  Temp: (!) 96.9 F (36.1 C)  SpO2: 99%      Body mass index is 29.29 kg/m.    ECOG FS:0 - Asymptomatic  General: Well-developed, well-nourished, no acute distress. Eyes: Pink conjunctiva, anicteric sclera. HEENT: Normocephalic, moist mucous membranes. Lungs: No audible wheezing or coughing. Heart: Regular rate and rhythm. Abdomen: Soft, nontender, no obvious distention. Musculoskeletal: No edema, cyanosis, or clubbing. Neuro: Alert, answering all questions appropriately. Cranial nerves grossly intact. Skin: No rashes or petechiae noted. Psych: Normal affect. Lymphatics: No palpable lymphadenopathy.  LAB RESULTS:  Lab Results  Component Value Date   NA 139 12/12/2022   K 3.6 12/12/2022   CL 105 12/12/2022   CO2 26 12/12/2022   GLUCOSE 100 (H) 12/12/2022   BUN 16 12/12/2022   CREATININE 1.11 (H) 12/12/2022   CALCIUM 8.7 (L) 12/12/2022   PROT 6.2 (L) 12/12/2022   ALBUMIN 3.9 12/12/2022   AST 18 12/12/2022   ALT 18 12/12/2022   ALKPHOS 70 12/12/2022   BILITOT 0.8 12/12/2022   GFRNONAA 59 (L) 12/12/2022   GFRAA 79 12/06/2020    Lab Results  Component Value Date   WBC 4.4 12/12/2022    NEUTROABS 1.8 12/12/2022   HGB 13.9 12/12/2022   HCT 41.0 12/12/2022   MCV 101.7 (H) 12/12/2022   PLT 127 (L) 12/12/2022     STUDIES: CT SOFT TISSUE NECK W CONTRAST  Result Date: 12/11/2022 CLINICAL DATA:  Restaging CLL EXAM: CT NECK WITH CONTRAST TECHNIQUE: Multidetector CT imaging of the neck was performed using the standard protocol following the bolus administration of intravenous contrast. RADIATION DOSE REDUCTION: This exam was performed according to the departmental dose-optimization program which includes automated exposure control, adjustment of the mA and/or kV according to patient size and/or use of iterative reconstruction technique. CONTRAST:  149mL OMNIPAQUE IOHEXOL 300 MG/ML  SOLN COMPARISON:  07/31/2022 FINDINGS: Pharynx and larynx: No mucosal or submucosal lesion. Salivary glands: Parotid and submandibular glands are normal. Thyroid: Normal. Lymph nodes: Bilateral cervical chain nodes are stable or smaller compared to the previous exam. Previously measured left sided no just underneath the sternocleidomastoid muscle axial image 39 measures 10 x 5 mm compared with 12 x 7 mm previously. Previously measured level 5 node axial image 54 measures 7 x 5 mm, compared with 8 x 5 mm previously. Left supraclavicular node axial image 85 measures 9 mm in diameter, compared with 10 mm on the previous study. No new or enlarging nodes. Vascular: No abnormal vascular finding. Limited intracranial: Normal Visualized orbits: Normal Mastoids and visualized paranasal sinuses: Mild mucosal thickening of the left sphenoid sinus. Other sinuses are clear. Skeleton: Normal Upper chest: See results of chest CT. Other: None IMPRESSION: 1. Bilateral cervical chain nodes are stable or smaller compared to the previous study. No new or enlarging nodes. Electronically Signed   By: Nelson Chimes M.D.   On: 12/11/2022 12:13   CT CHEST ABDOMEN PELVIS W CONTRAST  Result Date: 12/09/2022 CLINICAL DATA:  Restaging CLL. On  chemotherapy. * Tracking Code: BO * EXAM: CT CHEST, ABDOMEN, AND PELVIS WITH CONTRAST TECHNIQUE: Multidetector CT imaging of the chest, abdomen and pelvis was performed following the standard protocol during bolus administration of intravenous contrast. RADIATION DOSE REDUCTION: This exam was performed according to the departmental dose-optimization program which includes automated exposure control, adjustment of  the mA and/or kV according to patient size and/or use of iterative reconstruction technique. CONTRAST:  185mL OMNIPAQUE IOHEXOL 300 MG/ML  SOLN COMPARISON:  CT scan 07/31/2022 and older. FINDINGS: CT CHEST FINDINGS Cardiovascular: Minimal atherosclerotic calcified plaque along the aortic arch. Grossly the aorta has a normal course and caliber. No pericardial effusion. The heart is nonenlarged. Mediastinum/Nodes: Thyroid gland is grossly preserved. Slightly patulous thoracic esophagus. There are several abnormal lymph nodes identified. Specific lesions will be followed. Distribution of these include axillary region, hilum, chest wall and mediastinum. The left axillary region lymph node which measured 2.5 x 1.3 cm, today on image 19 of series 3 measures 2.4 x 1.3 cm. The right subpectoral node which previously measured 20 x 11 mm, today on series 3, image 15 measures 20 by 12 mm. The precarinal node which previously measured 17 x 10 mm, today when measured in the same fashion on series 3, image 19 measures 19 by 10 mm. Overall extent and distribution of nodes is similar to previous examination. Lungs/Pleura: Emphysematous lung changes are identified. Breathing motion. There is some dependent atelectasis. No consolidation, pneumothorax or effusion. Previously there are several small nodules identified and described. Specific lesions will be followed for continuity. This includes a small ground-glass nodule laterally in the right lung which on the prior measured 4 mm and today on series 5, image 29 measures 4  mm. Middle lobe nodule previously measuring 5 mm, today appears less dense and smaller overall with maximum dimension on series 5, image 30 measuring 3 mm. No new lung nodules are identified. Musculoskeletal: Slight curvature of the spine. Slight degenerative changes identified. CT ABDOMEN PELVIS FINDINGS Hepatobiliary: Mild fatty liver infiltration. No space-occupying liver lesion. Gallbladder is contracted. Patent portal vein. Pancreas: Unremarkable. No pancreatic ductal dilatation or surrounding inflammatory changes. Spleen: No mass lesion within the spleen. AP diameter spleen measures 12.9 cm, borderline enlarged. Previously was measured up to 14 cm. Adrenals/Urinary Tract: Right adrenal gland is preserved. The left adrenal gland is nodular and unchanged. No enhancing renal mass. No collecting system dilatation. The ureters have normal course and caliber extend down to the bladder. Preserved contours of the urinary bladder. Stomach/Bowel: The large bowel has a normal course and caliber. Scattered colonic stool. Normal appendix. Stomach and small bowel are nondilated. Vascular/Lymphatic: Normal caliber aorta and IVC. Mild atherosclerotic calcified plaque along the aorta and branch vessels. Several abnormal lymph nodes are identified. This includes retroperitoneum, pelvic sidewall, splenic hilum and adjacent to the stomach. Specific lesions will be followed. Left para-aortic node which previously was measured at has a single lesion measuring 3.5 x 1.8 cm, today on series 3, image 73 measures 3.3 by 1.7 cm. Left external iliac chain node which previously measured 2.6 x 1.1 cm, today on series 3, image 106 measures 2.6 by 1.1 cm. The right pelvic sidewall node which measures 3.7 x 1.5 cm on the prior, today on series 3, image 111 measures 4.0 x 1.4 cm. Other nodes are similar as well. Reproductive: Status post hysterectomy. No adnexal masses. Other: No abdominal wall hernia or abnormality. No abdominopelvic ascites.  Musculoskeletal: Mild degenerative changes. Partial sacralization of the right side of L5. IMPRESSION: Diffuse lymph node enlargement again seen, similar in extent and distribution to the previous examination. Slight decrease in size of the spleen.  No intrinsic splenic mass. Subtle ground-glass lung nodules on the prior examination or slightly improved. Simple attention on follow-up. Electronically Signed   By: Jill Side M.D.   On: 12/09/2022  16:25    ONCOLOGY HISTORY:  Patient completed cycle 3 of Rituxan plus Treanda on March 26, 2018, but was then noted to have progressive disease.  She was then initiated on 480 mg Imbruvica in November 2019, but had progression of disease with increasing white blood cell count as well as CT scan on Jan 22, 2022 revealing increased lymphadenopathy consistent with progression of disease.  Attempted patient on venetoclax, but she could not tolerate it secondary to persistent diarrhea and transaminitis.   ASSESSMENT: CLL.  PLAN:    CLL: Confirmed by peripheral blood flow cytometry.  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  Patient recently completed 4 weekly cycles of Rituxan and now has transition to treatment every 4 weeks.  Her most recent CT scan on December 09, 2022 reviewed independently with stable to improved disease burden.  Continue current treatments at this time.  Continue Claritin, and famotidine along with her 10 mg Revlimid daily.  She has been instructed to taper her prednisone off secondary to weight gain.  Proceed with cycle 13 of treatment today.  Return to clinic in 4 weeks for further evaluation and consideration of cycle 14.   Leukocytosis: Resolved. Trigeminal neuralgia: Resolved.  MRI of the brain on Jan 25, 2019 was unremarkable.   Depression/anxiety: Chronic and unchanged.  Continue follow-up and treatment per primary care. Neuropathic pain/peripheral neuropathy:  Chronic and unchanged.  She reports she is now on disability.  Continue current follow-up and treatment as per neurology. Rituxan reaction: Patient has been given additional premedications for preventative measures.  Thrombocytopenia: Mild, monitor. Rash: Resolved with prednisone, Claritin, and famotidine.  Continue Revlimid as above.  If patient remains on prednisone long-term, she will require bone mineral density. Axillary tenderness: Patient does not complain of this today. Congestion/cough: Resolved. Weight gain: Patient has been instructed to taper off her prednisone.   Patient expressed understanding and was in agreement with this plan. She also understands that She can call clinic at any time with any questions, concerns, or complaints.    Lloyd Huger, MD   12/12/2022 1:37 PM

## 2022-12-16 ENCOUNTER — Ambulatory Visit
Admission: RE | Admit: 2022-12-16 | Discharge: 2022-12-16 | Disposition: A | Discharge status: Home / Self Care | Payer: 59 | Source: Ambulatory Visit | Attending: Family Medicine | Admitting: Family Medicine

## 2022-12-16 ENCOUNTER — Other Ambulatory Visit: Payer: Self-pay

## 2022-12-16 DIAGNOSIS — Z1231 Encounter for screening mammogram for malignant neoplasm of breast: Secondary | ICD-10-CM | POA: Diagnosis not present

## 2022-12-17 ENCOUNTER — Other Ambulatory Visit: Payer: Self-pay | Admitting: Oncology

## 2022-12-17 ENCOUNTER — Other Ambulatory Visit: Payer: Self-pay

## 2022-12-17 DIAGNOSIS — C9110 Chronic lymphocytic leukemia of B-cell type not having achieved remission: Secondary | ICD-10-CM

## 2022-12-18 DIAGNOSIS — H04123 Dry eye syndrome of bilateral lacrimal glands: Secondary | ICD-10-CM | POA: Diagnosis not present

## 2022-12-19 DIAGNOSIS — H04123 Dry eye syndrome of bilateral lacrimal glands: Secondary | ICD-10-CM | POA: Diagnosis not present

## 2022-12-20 ENCOUNTER — Other Ambulatory Visit: Payer: Self-pay | Admitting: Oncology

## 2022-12-20 DIAGNOSIS — C9110 Chronic lymphocytic leukemia of B-cell type not having achieved remission: Secondary | ICD-10-CM

## 2022-12-24 ENCOUNTER — Telehealth: Payer: Self-pay | Admitting: *Deleted

## 2022-12-24 NOTE — Telephone Encounter (Signed)
Patient called reporting that she has an appointment on the 25 th, but her fathwer in [place] is on his death bed and she has to go there for an extended period of time. SH eis asking if she can get her treatment in [place] instead. But did not say that she had a facility or doctor in mind. Please advise

## 2022-12-24 NOTE — Telephone Encounter (Signed)
Call returned to patient and informed of doctor response, she was relieved to hear this and said she does not l-plan to be there for 4 months.

## 2023-01-03 ENCOUNTER — Other Ambulatory Visit: Payer: Self-pay

## 2023-01-03 ENCOUNTER — Other Ambulatory Visit: Payer: Self-pay | Admitting: *Deleted

## 2023-01-03 DIAGNOSIS — C9110 Chronic lymphocytic leukemia of B-cell type not having achieved remission: Secondary | ICD-10-CM

## 2023-01-03 MED ORDER — LENALIDOMIDE 10 MG PO CAPS: 10.0000 mg | ORAL_CAPSULE | Freq: Every day | ORAL | 0 refills | Status: DC

## 2023-01-08 MED FILL — Dexamethasone Sodium Phosphate Inj 100 MG/10ML: INTRAMUSCULAR | Qty: 2 | Status: AC

## 2023-01-09 ENCOUNTER — Other Ambulatory Visit: Payer: 59

## 2023-01-09 ENCOUNTER — Ambulatory Visit: Payer: 59

## 2023-01-09 ENCOUNTER — Inpatient Hospital Stay: Payer: 59 | Attending: Oncology

## 2023-01-09 ENCOUNTER — Inpatient Hospital Stay: Payer: 59 | Admitting: Oncology

## 2023-01-09 ENCOUNTER — Ambulatory Visit: Payer: 59 | Admitting: Oncology

## 2023-01-09 ENCOUNTER — Inpatient Hospital Stay: Payer: 59

## 2023-01-29 ENCOUNTER — Other Ambulatory Visit: Payer: Self-pay | Admitting: *Deleted

## 2023-01-29 DIAGNOSIS — C9110 Chronic lymphocytic leukemia of B-cell type not having achieved remission: Secondary | ICD-10-CM

## 2023-01-29 MED ORDER — LENALIDOMIDE 10 MG PO CAPS: 10.0000 mg | ORAL_CAPSULE | Freq: Every day | ORAL | 0 refills | Status: DC

## 2023-01-30 ENCOUNTER — Encounter: Payer: Self-pay | Admitting: Oncology

## 2023-02-05 MED FILL — Dexamethasone Sodium Phosphate Inj 100 MG/10ML: INTRAMUSCULAR | Qty: 2 | Status: AC

## 2023-02-06 ENCOUNTER — Inpatient Hospital Stay (HOSPITAL_BASED_OUTPATIENT_CLINIC_OR_DEPARTMENT_OTHER): Payer: Medicare Other | Admitting: Oncology

## 2023-02-06 ENCOUNTER — Inpatient Hospital Stay: Payer: Medicare Other | Attending: Oncology

## 2023-02-06 ENCOUNTER — Encounter: Payer: Self-pay | Admitting: Oncology

## 2023-02-06 ENCOUNTER — Inpatient Hospital Stay: Payer: Medicare Other

## 2023-02-06 VITALS — BP 94/58 | HR 66 | Temp 96.5°F | Resp 16

## 2023-02-06 DIAGNOSIS — Z79899 Other long term (current) drug therapy: Secondary | ICD-10-CM | POA: Insufficient documentation

## 2023-02-06 DIAGNOSIS — C9110 Chronic lymphocytic leukemia of B-cell type not having achieved remission: Secondary | ICD-10-CM | POA: Diagnosis not present

## 2023-02-06 DIAGNOSIS — Z5112 Encounter for antineoplastic immunotherapy: Secondary | ICD-10-CM | POA: Diagnosis present

## 2023-02-06 LAB — COMPREHENSIVE METABOLIC PANEL
ALT: 25 U/L (ref 0–44)
AST: 23 U/L (ref 15–41)
Albumin: 4 g/dL (ref 3.5–5.0)
Alkaline Phosphatase: 82 U/L (ref 38–126)
Anion gap: 10 (ref 5–15)
BUN: 18 mg/dL (ref 6–20)
CO2: 24 mmol/L (ref 22–32)
Calcium: 8.9 mg/dL (ref 8.9–10.3)
Chloride: 104 mmol/L (ref 98–111)
Creatinine, Ser: 1.15 mg/dL — ABNORMAL HIGH (ref 0.44–1.00)
GFR, Estimated: 57 mL/min — ABNORMAL LOW (ref >60)
Glucose, Bld: 96 mg/dL (ref 70–99)
Potassium: 4.4 mmol/L (ref 3.5–5.1)
Sodium: 138 mmol/L (ref 135–145)
Total Bilirubin: 0.8 mg/dL (ref 0.3–1.2)
Total Protein: 6.6 g/dL (ref 6.5–8.1)

## 2023-02-06 LAB — CBC WITH DIFFERENTIAL/PLATELET
Abs Immature Granulocytes: 0.03 10*3/uL (ref 0.00–0.07)
Basophils Absolute: 0.1 10*3/uL (ref 0.0–0.1)
Basophils Relative: 2 %
Eosinophils Absolute: 0.3 10*3/uL (ref 0.0–0.5)
Eosinophils Relative: 6 %
HCT: 41.1 % (ref 36.0–46.0)
Hemoglobin: 14.2 g/dL (ref 12.0–15.0)
Immature Granulocytes: 1 %
Lymphocytes Relative: 36 %
Lymphs Abs: 1.4 10*3/uL (ref 0.7–4.0)
MCH: 34.7 pg — ABNORMAL HIGH (ref 26.0–34.0)
MCHC: 34.5 g/dL (ref 30.0–36.0)
MCV: 100.5 fL — ABNORMAL HIGH (ref 80.0–100.0)
Monocytes Absolute: 0.7 10*3/uL (ref 0.1–1.0)
Monocytes Relative: 18 %
Neutro Abs: 1.5 10*3/uL — ABNORMAL LOW (ref 1.7–7.7)
Neutrophils Relative %: 37 %
Platelets: 115 10*3/uL — ABNORMAL LOW (ref 150–400)
RBC: 4.09 MIL/uL (ref 3.87–5.11)
RDW: 13 % (ref 11.5–15.5)
WBC: 3.9 10*3/uL — ABNORMAL LOW (ref 4.0–10.5)
nRBC: 0 % (ref 0.0–0.2)

## 2023-02-06 MED ORDER — SODIUM CHLORIDE 0.9 % IV SOLN
375.0000 mg/m2 | Freq: Once | INTRAVENOUS | Status: AC
  Administered 2023-02-06: 700 mg via INTRAVENOUS
  Filled 2023-02-06: qty 50

## 2023-02-06 MED ORDER — ACETAMINOPHEN 325 MG PO TABS
650.0000 mg | ORAL_TABLET | Freq: Once | ORAL | Status: AC
  Administered 2023-02-06: 650 mg via ORAL
  Filled 2023-02-06: qty 2

## 2023-02-06 MED ORDER — FAMOTIDINE 20 MG IN NS 100 ML IVPB
20.0000 mg | Freq: Once | INTRAVENOUS | Status: AC
  Administered 2023-02-06: 20 mg via INTRAVENOUS
  Filled 2023-02-06: qty 20

## 2023-02-06 MED ORDER — FAMOTIDINE IN NACL 20-0.9 MG/50ML-% IV SOLN: 20.0000 mg | Freq: Once | INTRAVENOUS | Status: DC

## 2023-02-06 MED ORDER — DIPHENHYDRAMINE HCL 25 MG PO CAPS
50.0000 mg | ORAL_CAPSULE | Freq: Once | ORAL | Status: AC
  Administered 2023-02-06: 50 mg via ORAL
  Filled 2023-02-06: qty 2

## 2023-02-06 MED ORDER — SODIUM CHLORIDE 0.9 % IV SOLN
Freq: Once | INTRAVENOUS | Status: AC
  Filled 2023-02-06: qty 250

## 2023-02-06 MED ORDER — SODIUM CHLORIDE 0.9 % IV SOLN
20.0000 mg | Freq: Once | INTRAVENOUS | Status: AC
  Administered 2023-02-06: 20 mg via INTRAVENOUS
  Filled 2023-02-06: qty 20

## 2023-02-06 NOTE — Patient Instructions (Signed)
[facility]  Discharge Instructions: Thank you for choosing [facility] to provide your oncology and hematology care.  If you have a lab appointment with the Cancer Center, please go directly to the Cancer Center and check in at the registration area.  Wear comfortable clothing and clothing appropriate for easy access to any Portacath or PICC line.   We strive to give you quality time with your provider. You may need to reschedule your appointment if you arrive late (15 or more minutes).  Arriving late affects you and other patients whose appointments are after yours.  Also, if you miss three or more appointments without notifying the office, you may be dismissed from the clinic at the provider's discretion.      For prescription refill requests, have your pharmacy contact our office and allow 72 hours for refills to be completed.    Today you received the following chemotherapy and/or immunotherapy agents Ruxience      To help prevent nausea and vomiting after your treatment, we encourage you to take your nausea medication as directed.  BELOW ARE SYMPTOMS THAT SHOULD BE REPORTED IMMEDIATELY: *FEVER GREATER THAN 100.4 F (38 C) OR HIGHER *CHILLS OR SWEATING *NAUSEA AND VOMITING THAT IS NOT CONTROLLED WITH YOUR NAUSEA MEDICATION *UNUSUAL SHORTNESS OF BREATH *UNUSUAL BRUISING OR BLEEDING *URINARY PROBLEMS (pain or burning when urinating, or frequent urination) *BOWEL PROBLEMS (unusual diarrhea, constipation, pain near the anus) TENDERNESS IN MOUTH AND THROAT WITH OR WITHOUT PRESENCE OF ULCERS (sore throat, sores in mouth, or a toothache) UNUSUAL RASH, SWELLING OR PAIN  UNUSUAL VAGINAL DISCHARGE OR ITCHING   Items with * indicate a potential emergency and should be followed up as soon as possible or go to the Emergency Department if any problems should occur.  Please show the CHEMOTHERAPY ALERT CARD or IMMUNOTHERAPY ALERT CARD at check-in to  the Emergency Department and triage nurse.  Should you have questions after your visit or need to cancel or reschedule your appointment, please contact [facility]  [phone] and follow the prompts.  Office hours are 8:00 a.m. to 4:30 p.m. Monday - Friday. Please note that voicemails left after 4:00 p.m. may not be returned until the following business day.  We are closed weekends and major holidays. You have access to a nurse at all times for urgent questions. Please call the main number to the clinic [phone] and follow the prompts.  For any non-urgent questions, you may also contact your provider using MyChart. We now offer e-Visits for anyone 18 and older to request care online for non-urgent symptoms. For details visit [email or web address].   Also download the MyChart app! Go to the app store, search "MyChart", open the app, select , and log in with your MyChart username and password.    

## 2023-02-06 NOTE — Progress Notes (Signed)
[facility]  [phone] [phone]  ID: Monique [name] OB: [birth year]  MR#: [id number]  AOZ#:[id number]  Patient Care Team: Alba Cory, MD as PCP - General (Family Medicine) Jeralyn Ruths, MD as Consulting Physician (Oncology) Zettie Pho, [facility] as Pharmacist (Pharmacist) Gustavus Bryant, LCSW as [facility] Care Management (Licensed Clinical Social Worker) Keitha Butte, RN as Registered Nurse (Oncology) Borders, Daryl Eastern, NP as Nurse Practitioner (Hospice and Palliative Medicine)  CHIEF COMPLAINT: CLL.  INTERVAL HISTORY: Patient returns to clinic today for further evaluation and continuation of Rituxan and Revlimid.  She missed her most recent treatment secondary to the death of her father.  She currently feels well.  She continues to have a chronic peripheral neuropathy.  She is tolerating her treatments without significant side effects.  She has no other neurologic complaints.  She has a good appetite.  She has no chest pain, shortness of breath, cough, or hemoptysis.  She denies any nausea, vomiting, or constipation.  She has no melena or hematochezia.  She has no urinary complaints.  Patient offers no further specific complaints today.  REVIEW OF SYSTEMS:   Review of Systems  Constitutional: Negative.  Negative for diaphoresis, fever, malaise/fatigue and weight loss.  HENT:  Negative for congestion.   Respiratory: Negative.  Negative for cough and shortness of breath.   Cardiovascular: Negative.  Negative for chest pain and leg swelling.  Gastrointestinal: Negative.  Negative for abdominal pain, constipation and diarrhea.  Genitourinary: Negative.  Negative for dysuria and frequency.  Musculoskeletal: Negative.  Negative for back pain, myalgias and neck pain.  Skin: Negative.  Negative for rash.  Neurological:  Positive for tingling and sensory change. Negative for focal weakness and weakness.   Psychiatric/Behavioral: Negative.  Negative for depression and suicidal ideas. The patient is not nervous/anxious.     As per HPI. Otherwise, a complete review of systems is negative.  PAST MEDICAL HISTORY: Past Medical History:  Diagnosis Date   Anxiety    Bursitis    leg pain   Carpal tunnel syndrome    Cervical dysplasia    hx LEEP over 18 years ago.    Chronic lymphocytic leukemia (HCC) 2018   Dr Orlie Dakin.  (in lymph nodes)   COPD (chronic obstructive pulmonary disease) (HCC)    COVID-19 2021   Depression    Eating disorder    GERD (gastroesophageal reflux disease)    History of self-harm    Insomnia    Obsession    Tobacco abuse    Vitamin B12 deficiency (non anemic)     PAST SURGICAL HISTORY: Past Surgical History:  Procedure Laterality Date   BREAST BIOPSY Right 01/05/2018   US guided biopsy of 2 areas and 1 lymph node, MIXED INFLAMMATION AND GIANT CELL REACTION   CERVICAL BIOPSY  W/ LOOP ELECTRODE EXCISION     COLONOSCOPY WITH PROPOFOL N/A 04/21/2019   Procedure: COLONOSCOPY WITH PROPOFOL;  Surgeon: Pasty Spillers, MD;  Location: [facility] END[facility];  Service: Gastroenterology;  Laterality: N/A;   OTHER SURGICAL HISTORY     scar tissue removed from vocal cords   TUBAL LIGATION     VAGINAL HYSTERECTOMY N/A 01/22/2021   Procedure: HYSTERECTOMY VAGINAL; BILATERAL SALPINGECTOMY;  Surgeon: Hildred Laser, MD;  Location: [facility] OR[facility];  Service: Gynecology;  Laterality: N/A;   vocal cord surgery  2005    FAMILY HISTORY: Family History  Problem Relation Age of Onset   Depression Mother    Cancer Mother  thyroid   Alcohol abuse Father    COPD Father    Alcohol abuse Brother    Depression Brother    Bipolar disorder Brother    Suicidality Brother    ADD / ADHD Son    Breast cancer Neg Hx     ADVANCED DIRECTIVES (Y/N):  N  HEALTH MAINTENANCE: Social History   Tobacco Use   Smoking status: Every Day    Packs/day: 1.00    Years: 31.00    Additional pack  years: 0.00    Total pack years: 31.00    Types: Cigarettes    Start date: 09/25/1986   Smokeless tobacco: Never  Vaping Use   Vaping Use: Former  Substance Use Topics   Alcohol use: Not Currently    Alcohol/week: 0.0 standard drinks of alcohol    Comment: rarely   Drug use: Yes    Frequency: 7.0 times per week    Types: Marijuana     Colonoscopy:  PAP:  Bone density:  Lipid panel:  Allergies  Allergen Reactions   Doxycycline Rash    Rash on cheeks and sides of nose     Current Outpatient Medications  Medication Sig Dispense Refill   albuterol (VENTOLIN HFA) 108 (90 Base) MCG/ACT inhaler Inhale 2 puffs into the lungs every 6 (six) hours as needed for wheezing or shortness of breath. 8 g 0   ARIPiprazole (ABILIFY) 5 MG tablet Take 1 tablet (5 mg total) by mouth daily. 90 tablet 1   Ascorbic Acid (VITAMIN C) 1000 MG tablet Take 1,000 mg by mouth daily as needed (immune health).     ASPIRIN 81 PO Take 81 mg by mouth daily.     atorvastatin (LIPITOR) 10 MG tablet Take 1 tablet (10 mg total) by mouth in the morning. 90 tablet 1   lenalidomide (REVLIMID) 10 MG capsule Take 1 capsule (10 mg total) by mouth daily. 28 capsule 0   loratadine (CLARITIN) 10 MG tablet Take 1 tablet (10 mg total) by mouth every morning. 90 tablet 1   Magnesium 500 MG CAPS Take 500 mg by mouth in the morning.     meloxicam (MOBIC) 15 MG tablet TAKE ONE TABLET BY MOUTH EVERY MORNING 90 tablet 1   omeprazole (PRILOSEC) 20 MG capsule Take 1 capsule (20 mg total) by mouth every morning. 90 capsule 1   pregabalin (LYRICA) 100 MG capsule Take 100 mg by mouth 3 (three) times daily.     PREMARIN 0.625 MG tablet TAKE ONE TABLET BY MOUTH ONCE daily FOR 21 DAYS THEN DO not take FOR 7 days 90 tablet 3   tiZANidine (ZANAFLEX) 2 MG tablet Take 1 tablet (2 mg total) by mouth at bedtime. 90 tablet 1   umeclidinium-vilanterol (ANORO ELLIPTA) 62.5-25 MCG/ACT AEPB Inhale 1 puff into the lungs daily. 60 each 5   vitamin B-12  (CYANOCOBALAMIN) 500 MCG tablet Take 250 mcg by mouth every morning.     Calcium Carbonate-Vit D-Min (GNP CALCIUM 1200) 1200-1000 MG-UNIT CHEW Chew 1,200 mg by mouth daily with breakfast. Take in combination with vitamin D and magnesium. 90 tablet 3   No current facility-administered medications for this visit.   Facility-Administered Medications Ordered in Other Visits  Medication Dose Route Frequency Provider Last Rate Last Admin   riTUXimab-pvvr (RUXIENCE) 700 mg in sodium chloride 0.9 % 250 mL (2.1875 mg/mL) infusion  375 mg/m2 (Treatment Plan Recorded) Intravenous Once Jeralyn Ruths, MD        OBJECTIVE: Vitals:   02/06/23  0843  BP: 105/71  Pulse: 73  Resp: 16  Temp: (!) 96.3 F (35.7 C)  SpO2: 99%      Body mass index is 29.43 kg/m.    ECOG FS:0 - Asymptomatic  General: Well-developed, well-nourished, no acute distress. Eyes: Pink conjunctiva, anicteric sclera. HEENT: Normocephalic, moist mucous membranes. Lungs: No audible wheezing or coughing. Heart: Regular rate and rhythm. Abdomen: Soft, nontender, no obvious distention. Musculoskeletal: No edema, cyanosis, or clubbing. Neuro: Alert, answering all questions appropriately. Cranial nerves grossly intact. Skin: No rashes or petechiae noted. Psych: Normal affect.  LAB RESULTS:  Lab Results  Component Value Date   NA 138 02/06/2023   K 4.4 02/06/2023   CL 104 02/06/2023   CO2 24 02/06/2023   GLUCOSE 96 02/06/2023   BUN 18 02/06/2023   CREATININE 1.15 (H) 02/06/2023   CALCIUM 8.9 02/06/2023   PROT 6.6 02/06/2023   ALBUMIN 4.0 02/06/2023   AST 23 02/06/2023   ALT 25 02/06/2023   ALKPHOS 82 02/06/2023   BILITOT 0.8 02/06/2023   GFRNONAA 57 (L) 02/06/2023   GFRAA 79 12/06/2020    Lab Results  Component Value Date   WBC 3.9 (L) 02/06/2023   NEUTROABS 1.5 (L) 02/06/2023   HGB 14.2 02/06/2023   HCT 41.1 02/06/2023   MCV 100.5 (H) 02/06/2023   PLT 115 (L) 02/06/2023     STUDIES: No results  found.  ONCOLOGY HISTORY:  Patient completed cycle 3 of Rituxan plus Treanda on March 26, 2018, but was then noted to have progressive disease.  She was then initiated on 480 mg Imbruvica in November 2019, but had progression of disease with increasing white blood cell count as well as CT scan on Jan 22, 2022 revealing increased lymphadenopathy consistent with progression of disease.  Attempted patient on venetoclax, but she could not tolerate it secondary to persistent diarrhea and transaminitis.   ASSESSMENT: CLL.  PLAN:    CLL: Confirmed by peripheral blood flow cytometry.  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  Patient completed 4 weekly cycles of Rituxan and now has transitioned to treatment every 4 weeks.  Her most recent CT scan on December 09, 2022 reviewed independently with stable to improved disease burden.  Continue current treatments at this time.  Continue Claritin and famotidine along with her 10 mg Revlimid daily.  Patient has discontinued prednisone.  Proceed with cycle 14 of treatment today.  Return to clinic in 4 weeks for further evaluation and consideration of cycle 15.     Leukopenia: Mild, monitor. Thrombocytopenia: Patient platelet count is 115.  Monitor. Trigeminal neuralgia: Resolved.  MRI of the brain on Jan 25, 2019 was unremarkable.   Depression/anxiety: Chronic and unchanged.  Continue follow-up and treatment per primary care. Neuropathic pain/peripheral neuropathy: Chronic and unchanged.  She reports she is now on disability.  Continue current follow-up and treatment as per neurology. Rituxan reaction: Patient has been given additional premedications for preventative measures.  Rash: Continue Claritin and famotidine along with Revlimid.  Weight gain: Patient has discontinued prednisone.  Patient expressed understanding and was in agreement with this plan. She also understands that She can call  clinic at any time with any questions, concerns, or complaints.    Jeralyn Ruths, MD   02/06/2023 10:13 AM

## 2023-02-25 ENCOUNTER — Other Ambulatory Visit: Payer: Self-pay | Admitting: *Deleted

## 2023-02-25 ENCOUNTER — Encounter: Payer: Self-pay | Admitting: Oncology

## 2023-02-25 DIAGNOSIS — C9110 Chronic lymphocytic leukemia of B-cell type not having achieved remission: Secondary | ICD-10-CM

## 2023-02-25 MED ORDER — LENALIDOMIDE 10 MG PO CAPS: 10.0000 mg | ORAL_CAPSULE | Freq: Every day | ORAL | 0 refills | Status: DC

## 2023-02-25 NOTE — Progress Notes (Unsigned)
Name: Monique [name]   MRN: [id number]    DOB: [birth year]   Date:02/26/2023       Progress Note  Subjective  Chief Complaint  Cough/ Nasal Drainage  HPI  She states symptoms started 2 weeks ago, initially dry cough, symptoms are getting worse, keeping her up at night. She is now having rhinorrhea, some green mucus  , woke up with mucus discharge from both eyes . Last night she has a mild frontal headache. She denies SOB or wheezing. She also noticed hoarseness over the past two days. She is allergic to doxy.   Denies fever, chills. 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/09/2020   Bilateral leg pain 11/03/2019   Iron deficiency anemia 10/15/2019   Bilateral arm pain 08/05/2019   Bilateral hand numbness 08/05/2019   Weakness of both hands 08/05/2019   Chronic musculoskeletal pain 07/19/2019   Chronic neuropathic pain 07/19/2019   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   Special screening for malignant neoplasms, colon    Polyp of sigmoid colon    Hemorrhoids    Trigeminal neuralgia 10/05/2018   Lumbar radiculitis (Right) 09/24/2018   Hypocalcemia 06/03/2018   Vitamin D insufficiency 06/03/2018   Abnormal MRI, lumbar spine (06/02/2020) 06/03/2018   Chronic hip pain (Right) 06/03/2018   Spondylosis without myelopathy or radiculopathy, lumbar region 06/03/2018   DDD (degenerative disc disease), lumbar 06/02/2018   Lumbar facet hypertrophy  06/02/2018   Lumbar facet arthropathy 06/02/2018   Lumbar facet syndrome (Bilateral) (R>L) 06/02/2018   Marijuana use 05/19/2018   Chronic lower extremity pain (1ry area of Pain) (Bilateral) (R>L) 05/13/2018   Chronic low back pain (2ry area of Pain) (Bilateral) (R>L) w/ sciatica (Bilateral) 05/13/2018   Chronic pain syndrome 05/13/2018   Disorder of skeletal system 05/13/2018   Pharmacologic therapy 05/13/2018   Problems influencing health status 05/13/2018   Mastalgia 05/05/2018   Breast mass, right 01/07/2018   Face pain 12/11/2017   Tingling 12/11/2017   Thoracic aortic atherosclerosis (HCC) 08/20/2017   Emphysema of lung (HCC) 08/20/2017   Adductor tendinitis 04/23/2017   Trochanteric bursitis of right hip 04/23/2017   GERD without esophagitis 12/11/2016   CLL (chronic lymphocytic leukemia) (HCC) 11/24/2016   Stress incontinence 09/04/2016   B12 deficiency 07/10/2015   Insomnia 07/10/2015   Anorexia nervosa, restricting type 07/10/2015   Anxiety, generalized 07/10/2015   H/O suicide attempt 07/10/2015   Lymphocytosis 07/10/2015   Obsessive-compulsive disorder 07/10/2015   Tobacco use 07/10/2015   History of cervical dysplasia 07/18/2014    Past Surgical History:  Procedure Laterality Date   BREAST BIOPSY Right 01/05/2018   US guided biopsy of 2 areas and 1 lymph node, MIXED INFLAMMATION AND GIANT CELL REACTION   CERVICAL BIOPSY  W/ LOOP ELECTRODE EXCISION     COLONOSCOPY WITH PROPOFOL N/A 04/21/2019   Procedure: COLONOSCOPY WITH PROPOFOL;  Surgeon: Pasty Spillers, MD;  Location: [facility];  Service: Gastroenterology;  Laterality: N/A;   OTHER SURGICAL HISTORY     scar tissue removed  from vocal cords   TUBAL LIGATION     VAGINAL HYSTERECTOMY N/A 01/22/2021   Procedure: HYSTERECTOMY VAGINAL; BILATERAL SALPINGECTOMY;  Surgeon: Hildred Laser, MD;  Location: [facility] ORS;  Service: Gynecology;  Laterality: N/A;   vocal cord surgery  2005    Family History  Problem  Relation Age of Onset   Depression Mother    Cancer Mother        thyroid   Alcohol abuse Father    COPD Father    Alcohol abuse Brother    Depression Brother    Bipolar disorder Brother    Suicidality Brother    ADD / ADHD Son    Breast cancer Neg Hx     Social History   Tobacco Use   Smoking status: Every Day    Packs/day: 1.00    Years: 31.00    Additional pack years: 0.00    Total pack years: 31.00    Types: Cigarettes    Start date: 09/25/1986   Smokeless tobacco: Never  Substance Use Topics   Alcohol use: Not Currently    Alcohol/week: 0.0 standard drinks of alcohol    Comment: rarely     Current Outpatient Medications:    albuterol (VENTOLIN HFA) 108 (90 Base) MCG/ACT inhaler, Inhale 2 puffs into the lungs every 6 (six) hours as needed for wheezing or shortness of breath., Disp: 8 g, Rfl: 0   ARIPiprazole (ABILIFY) 5 MG tablet, Take 1 tablet (5 mg total) by mouth daily., Disp: 90 tablet, Rfl: 1   Ascorbic Acid (VITAMIN C) 1000 MG tablet, Take 1,000 mg by mouth daily as needed (immune health)., Disp: , Rfl:    ASPIRIN 81 PO, Take 81 mg by mouth daily., Disp: , Rfl:    atorvastatin (LIPITOR) 10 MG tablet, Take 1 tablet (10 mg total) by mouth in the morning., Disp: 90 tablet, Rfl: 1   lenalidomide (REVLIMID) 10 MG capsule, Take 1 capsule (10 mg total) by mouth daily., Disp: 28 capsule, Rfl: 0   loratadine (CLARITIN) 10 MG tablet, Take 1 tablet (10 mg total) by mouth every morning., Disp: 90 tablet, Rfl: 1   Magnesium 500 MG CAPS, Take 500 mg by mouth in the morning., Disp: , Rfl:    meloxicam (MOBIC) 15 MG tablet, TAKE ONE TABLET BY MOUTH EVERY MORNING, Disp: 90 tablet, Rfl: 1   omeprazole (PRILOSEC) 20 MG capsule, Take 1 capsule (20 mg total) by mouth every morning., Disp: 90 capsule, Rfl: 1   pregabalin (LYRICA) 100 MG capsule, Take 100 mg by mouth 3 (three) times daily., Disp: , Rfl:    PREMARIN 0.625 MG tablet, TAKE ONE TABLET BY MOUTH ONCE daily FOR 21 DAYS THEN DO  not take FOR 7 days, Disp: 90 tablet, Rfl: 3   tiZANidine (ZANAFLEX) 2 MG tablet, Take 1 tablet (2 mg total) by mouth at bedtime., Disp: 90 tablet, Rfl: 1   umeclidinium-vilanterol (ANORO ELLIPTA) 62.5-25 MCG/ACT AEPB, Inhale 1 puff into the lungs daily., Disp: 60 each, Rfl: 5   vitamin B-12 (CYANOCOBALAMIN) 500 MCG tablet, Take 250 mcg by mouth every morning., Disp: , Rfl:    Calcium Carbonate-Vit D-Min (GNP CALCIUM 1200) 1200-1000 MG-UNIT CHEW, Chew 1,200 mg by mouth daily with breakfast. Take in combination with vitamin D and magnesium., Disp: 90 tablet, Rfl: 3  Allergies  Allergen Reactions   Doxycycline Rash    Rash on cheeks and sides of nose     I personally reviewed active problem list, medication list, allergies, family history,  social history, health maintenance with the patient/caregiver today.   ROS  Ten systems reviewed and is negative except as mentioned in HPI   Objective  Vitals:   02/26/23 0936  BP: 118/66  Pulse: 71  Resp: 16  Weight: 183 lb (83 kg)  Height: 5\' 7"  (1.702 m)    Body mass index is 28.66 kg/m.  Physical Exam  Constitutional: Patient appears well-developed and well-nourished.  No distress.  HEENT: head atraumatic, normocephalic, pupils equal and reactive to light, ears normal TM, neck supple, throat within normal limits, tender during percussion of frontal sinuses  Cardiovascular: Normal rate, regular rhythm and normal heart sounds.  No murmur heard. No BLE edema. Pulmonary/Chest: Effort normal and breath sounds normal. No respiratory distress. Abdominal: Soft.  There is no tenderness. Psychiatric: Patient has a normal mood and affect. behavior is normal. Judgment and thought content normal.    PHQ2/9:    02/26/2023    9:35 AM 11/25/2022   10:58 AM 11/22/2022    1:58 PM 11/12/2022    8:24 AM 11/05/2022   11:40 AM  Depression screen PHQ 2/9  Decreased Interest 0 0 0 0 0  Down, Depressed, Hopeless 0 0 0 0 0  PHQ - 2 Score 0 0 0 0 0   Altered sleeping 0  0 0 0  Tired, decreased energy 0  0 0 0  Change in appetite 0  0 0 0  Feeling bad or failure about yourself  0  0 0 0  Trouble concentrating 0  0 0 0  Moving slowly or fidgety/restless 0  0 0 0  Suicidal thoughts 0  0 0 0  PHQ-9 Score 0  0 0 0  Difficult doing work/chores   Not difficult at all  Not difficult at all    phq 9 is negative   Fall Risk:    02/26/2023    9:35 AM 11/25/2022   10:58 AM 11/22/2022    1:53 PM 11/12/2022    8:23 AM 11/05/2022   11:40 AM  Fall Risk   Falls in the past year? 0 0 1 0 0  Number falls in past yr: 0 0 0  0  Injury with Fall? 0 0 1  0  Risk for fall due to : No Fall Risks  No Fall Risks No Fall Risks No Fall Risks  Follow up Falls prevention discussed  Education provided;Falls prevention discussed Falls prevention discussed Falls prevention discussed;Education provided;Falls evaluation completed      Functional Status Survey: Is the patient deaf or have difficulty hearing?: No Does the patient have difficulty seeing, even when wearing glasses/contacts?: No Does the patient have difficulty concentrating, remembering, or making decisions?: No Does the patient have difficulty walking or climbing stairs?: No Does the patient have difficulty dressing or bathing?: No Does the patient have difficulty doing errands alone such as visiting a doctor's office or shopping?: No    Assessment & Plan  1. Cough in adult  - chlorpheniramine-HYDROcodone (TUSSIONEX) 10-8 MG/5ML; Take 5 mLs by mouth at bedtime as needed for cough.  Dispense: 115 mL; Refill: 0 - benzonatate (TESSALON) 100 MG capsule; Take 1-2 capsules (100-200 mg total) by mouth 2 (two) times daily as needed.  Dispense: 40 capsule; Refill: 0  2. Subacute frontal sinusitis  - amoxicillin-clavulanate (AUGMENTIN) 875-125 MG tablet; Take 1 tablet by mouth 2 (two) times daily.  Dispense: 20 tablet; Refill: 0

## 2023-02-26 ENCOUNTER — Encounter: Payer: Self-pay | Admitting: Family Medicine

## 2023-02-26 ENCOUNTER — Ambulatory Visit (INDEPENDENT_AMBULATORY_CARE_PROVIDER_SITE_OTHER): Payer: Medicare PPO | Admitting: Family Medicine

## 2023-02-26 VITALS — BP 118/66 | HR 71 | Resp 16 | Ht 67.0 in | Wt 183.0 lb

## 2023-02-26 DIAGNOSIS — J0110 Acute frontal sinusitis, unspecified: Secondary | ICD-10-CM | POA: Diagnosis not present

## 2023-02-26 DIAGNOSIS — R059 Cough, unspecified: Secondary | ICD-10-CM

## 2023-02-26 MED ORDER — HYDROCOD POLI-CHLORPHE POLI ER 10-8 MG/5ML PO SUER: 5.0000 mL | Freq: Every evening | ORAL | 0 refills | Status: DC | PRN

## 2023-02-26 MED ORDER — AMOXICILLIN-POT CLAVULANATE 875-125 MG PO TABS: 1.0000 | ORAL_TABLET | Freq: Two times a day (BID) | ORAL | 0 refills | Status: DC

## 2023-02-26 MED ORDER — BENZONATATE 100 MG PO CAPS: 100.0000 mg | ORAL_CAPSULE | Freq: Two times a day (BID) | ORAL | 0 refills | Status: DC | PRN

## 2023-02-28 ENCOUNTER — Telehealth: Payer: Self-pay

## 2023-02-28 NOTE — Telephone Encounter (Signed)
Please review fax below sent in through [place] with cost saving alternative for current prescription premarin. Recommended alternatives : Estradiol vaginal cream, Venlafaxine tablet and Celexa tablet. Please review. KW

## 2023-03-03 ENCOUNTER — Other Ambulatory Visit: Payer: Self-pay | Admitting: *Deleted

## 2023-03-03 ENCOUNTER — Encounter: Payer: Self-pay | Admitting: Oncology

## 2023-03-03 ENCOUNTER — Telehealth: Payer: Self-pay

## 2023-03-03 ENCOUNTER — Other Ambulatory Visit (HOSPITAL_COMMUNITY): Payer: Self-pay

## 2023-03-03 DIAGNOSIS — C9110 Chronic lymphocytic leukemia of B-cell type not having achieved remission: Secondary | ICD-10-CM

## 2023-03-03 MED ORDER — LENALIDOMIDE 10 MG PO CAPS: 10.0000 mg | ORAL_CAPSULE | Freq: Every day | ORAL | 0 refills | Status: DC

## 2023-03-03 NOTE — Telephone Encounter (Signed)
Information was shared with Karna Dupes, who will look into whether a PA with the new insurance is required. Redirecting to [employer].

## 2023-03-03 NOTE — Telephone Encounter (Signed)
Oral Oncology Patient Advocate Encounter  Received notification that patient had a change in insurance and script needed to be sent to [facility] now.   After completing a benefits investigation, prior authorization for Lenalidomide is not required at this time through [facility] Part D.  Patient's copay is $0.00.  Script and all supporting documents sent to [facility] for processing and dispensing.     Ardeen Fillers, CPhT Oncology Pharmacy Patient Advocate  [facility]  [phone] (phone) [phone] (fax) 03/03/2023 10:42 AM

## 2023-03-03 NOTE — Telephone Encounter (Signed)
Patient called reporting that her pharmacy has changed with her new insurance and the Revlimid needs to go to [employer]

## 2023-03-04 ENCOUNTER — Other Ambulatory Visit: Payer: Self-pay | Admitting: Oncology

## 2023-03-04 ENCOUNTER — Encounter: Payer: Self-pay | Admitting: Oncology

## 2023-03-04 DIAGNOSIS — C9110 Chronic lymphocytic leukemia of B-cell type not having achieved remission: Secondary | ICD-10-CM

## 2023-03-06 ENCOUNTER — Ambulatory Visit: Payer: Medicare Other | Admitting: Pharmacist

## 2023-03-06 ENCOUNTER — Ambulatory Visit: Payer: Medicare Other | Admitting: Oncology

## 2023-03-06 ENCOUNTER — Ambulatory Visit: Payer: Medicare Other

## 2023-03-06 ENCOUNTER — Other Ambulatory Visit: Payer: Medicare Other

## 2023-03-06 MED FILL — Dexamethasone Sodium Phosphate Inj 100 MG/10ML: INTRAMUSCULAR | Qty: 2 | Status: AC

## 2023-03-07 ENCOUNTER — Inpatient Hospital Stay: Payer: Medicare PPO

## 2023-03-07 ENCOUNTER — Inpatient Hospital Stay (HOSPITAL_BASED_OUTPATIENT_CLINIC_OR_DEPARTMENT_OTHER): Payer: Medicare PPO | Admitting: Oncology

## 2023-03-07 ENCOUNTER — Ambulatory Visit: Payer: Medicare Other

## 2023-03-07 ENCOUNTER — Encounter: Payer: Self-pay | Admitting: Oncology

## 2023-03-07 ENCOUNTER — Ambulatory Visit: Payer: Medicare Other | Admitting: Oncology

## 2023-03-07 ENCOUNTER — Other Ambulatory Visit: Payer: Medicare Other

## 2023-03-07 ENCOUNTER — Inpatient Hospital Stay: Payer: Medicare PPO | Attending: Oncology

## 2023-03-07 VITALS — BP 92/42 | HR 71 | Temp 97.0°F | Resp 18 | Ht 67.0 in | Wt 183.7 lb

## 2023-03-07 VITALS — BP 107/54 | HR 66

## 2023-03-07 DIAGNOSIS — Z79899 Other long term (current) drug therapy: Secondary | ICD-10-CM | POA: Diagnosis not present

## 2023-03-07 DIAGNOSIS — C9110 Chronic lymphocytic leukemia of B-cell type not having achieved remission: Secondary | ICD-10-CM

## 2023-03-07 DIAGNOSIS — Z5112 Encounter for antineoplastic immunotherapy: Secondary | ICD-10-CM | POA: Diagnosis present

## 2023-03-07 LAB — CBC WITH DIFFERENTIAL/PLATELET
Abs Immature Granulocytes: 0.02 10*3/uL (ref 0.00–0.07)
Basophils Absolute: 0.1 10*3/uL (ref 0.0–0.1)
Basophils Relative: 2 %
Eosinophils Absolute: 0.4 10*3/uL (ref 0.0–0.5)
Eosinophils Relative: 9 %
HCT: 39.5 % (ref 36.0–46.0)
Hemoglobin: 13.6 g/dL (ref 12.0–15.0)
Immature Granulocytes: 1 %
Lymphocytes Relative: 36 %
Lymphs Abs: 1.5 10*3/uL (ref 0.7–4.0)
MCH: 34.5 pg — ABNORMAL HIGH (ref 26.0–34.0)
MCHC: 34.4 g/dL (ref 30.0–36.0)
MCV: 100.3 fL — ABNORMAL HIGH (ref 80.0–100.0)
Monocytes Absolute: 0.5 10*3/uL (ref 0.1–1.0)
Monocytes Relative: 11 %
Neutro Abs: 1.7 10*3/uL (ref 1.7–7.7)
Neutrophils Relative %: 41 %
Platelets: 122 10*3/uL — ABNORMAL LOW (ref 150–400)
RBC: 3.94 MIL/uL (ref 3.87–5.11)
RDW: 12.7 % (ref 11.5–15.5)
WBC: 4 10*3/uL (ref 4.0–10.5)
nRBC: 0 % (ref 0.0–0.2)

## 2023-03-07 LAB — COMPREHENSIVE METABOLIC PANEL
ALT: 20 U/L (ref 0–44)
AST: 19 U/L (ref 15–41)
Albumin: 3.9 g/dL (ref 3.5–5.0)
Alkaline Phosphatase: 67 U/L (ref 38–126)
Anion gap: 9 (ref 5–15)
BUN: 18 mg/dL (ref 6–20)
CO2: 25 mmol/L (ref 22–32)
Calcium: 8.8 mg/dL — ABNORMAL LOW (ref 8.9–10.3)
Chloride: 103 mmol/L (ref 98–111)
Creatinine, Ser: 1.14 mg/dL — ABNORMAL HIGH (ref 0.44–1.00)
GFR, Estimated: 58 mL/min — ABNORMAL LOW (ref >60)
Glucose, Bld: 88 mg/dL (ref 70–99)
Potassium: 4.2 mmol/L (ref 3.5–5.1)
Sodium: 137 mmol/L (ref 135–145)
Total Bilirubin: 0.7 mg/dL (ref 0.3–1.2)
Total Protein: 6.4 g/dL — ABNORMAL LOW (ref 6.5–8.1)

## 2023-03-07 MED ORDER — SODIUM CHLORIDE 0.9 % IV SOLN
375.0000 mg/m2 | Freq: Once | INTRAVENOUS | Status: AC
  Administered 2023-03-07: 700 mg via INTRAVENOUS
  Filled 2023-03-07: qty 50

## 2023-03-07 MED ORDER — DIPHENHYDRAMINE HCL 25 MG PO CAPS
50.0000 mg | ORAL_CAPSULE | Freq: Once | ORAL | Status: AC
  Administered 2023-03-07: 50 mg via ORAL
  Filled 2023-03-07: qty 2

## 2023-03-07 MED ORDER — SODIUM CHLORIDE 0.9 % IV SOLN
Freq: Once | INTRAVENOUS | Status: DC
  Filled 2023-03-07: qty 250

## 2023-03-07 MED ORDER — SODIUM CHLORIDE 0.9 % IV SOLN
Freq: Once | INTRAVENOUS | Status: AC
  Filled 2023-03-07: qty 250

## 2023-03-07 MED ORDER — SODIUM CHLORIDE 0.9 % IV SOLN
20.0000 mg | Freq: Once | INTRAVENOUS | Status: AC
  Administered 2023-03-07: 20 mg via INTRAVENOUS
  Filled 2023-03-07: qty 2

## 2023-03-07 MED ORDER — SODIUM CHLORIDE 0.9 % IV SOLN
375.0000 mg/m2 | Freq: Once | INTRAVENOUS | Status: DC
  Filled 2023-03-07: qty 70

## 2023-03-07 MED ORDER — ACETAMINOPHEN 325 MG PO TABS
650.0000 mg | ORAL_TABLET | Freq: Once | ORAL | Status: AC
  Administered 2023-03-07: 650 mg via ORAL
  Filled 2023-03-07: qty 2

## 2023-03-07 MED ORDER — FAMOTIDINE IN NACL 20-0.9 MG/50ML-% IV SOLN
20.0000 mg | Freq: Once | INTRAVENOUS | Status: AC
  Administered 2023-03-07: 20 mg via INTRAVENOUS
  Filled 2023-03-07: qty 50

## 2023-03-07 NOTE — Patient Instructions (Signed)
[facility] [place] [facility]  Discharge Instructions: Thank you for choosing [facility] to provide your oncology and hematology care.  If you have a lab appointment with the Cancer Center, please go directly to the Cancer Center and check in at the registration area.  Wear comfortable clothing and clothing appropriate for easy access to any Portacath or PICC line.   We strive to give you quality time with your provider. You may need to reschedule your appointment if you arrive late (15 or more minutes).  Arriving late affects you and other patients whose appointments are after yours.  Also, if you miss three or more appointments without notifying the office, you may be dismissed from the clinic at the provider's discretion.      For prescription refill requests, have your pharmacy contact our office and allow 72 hours for refills to be completed.    Today you received the following chemotherapy and/or immunotherapy agents Ruxience      To help prevent nausea and vomiting after your treatment, we encourage you to take your nausea medication as directed.  BELOW ARE SYMPTOMS THAT SHOULD BE REPORTED IMMEDIATELY: *FEVER GREATER THAN 100.4 F (38 C) OR HIGHER *CHILLS OR SWEATING *NAUSEA AND VOMITING THAT IS NOT CONTROLLED WITH YOUR NAUSEA MEDICATION *UNUSUAL SHORTNESS OF BREATH *UNUSUAL BRUISING OR BLEEDING *URINARY PROBLEMS (pain or burning when urinating, or frequent urination) *BOWEL PROBLEMS (unusual diarrhea, constipation, pain near the anus) TENDERNESS IN MOUTH AND THROAT WITH OR WITHOUT PRESENCE OF ULCERS (sore throat, sores in mouth, or a toothache) UNUSUAL RASH, SWELLING OR PAIN  UNUSUAL VAGINAL DISCHARGE OR ITCHING   Items with * indicate a potential emergency and should be followed up as soon as possible or go to the Emergency Department if any problems should occur.  Please show the CHEMOTHERAPY ALERT CARD or IMMUNOTHERAPY ALERT CARD at check-in to  the Emergency Department and triage nurse.  Should you have questions after your visit or need to cancel or reschedule your appointment, please contact [facility] [place] [facility]  [phone] and follow the prompts.  Office hours are 8:00 a.m. to 4:30 p.m. Monday - Friday. Please note that voicemails left after 4:00 p.m. may not be returned until the following business day.  We are closed weekends and major holidays. You have access to a nurse at all times for urgent questions. Please call the main number to the clinic [phone] and follow the prompts.  For any non-urgent questions, you may also contact your provider using MyChart. We now offer e-Visits for anyone 18 and older to request care online for non-urgent symptoms. For details visit [email or web address].   Also download the MyChart app! Go to the app store, search "MyChart", open the app, select , and log in with your MyChart username and password.    

## 2023-03-07 NOTE — Progress Notes (Signed)
[facility]  Telephone:([phone]) [phone] Fax:(336) [phone]  ID: Monique [name] OB: 03-May-1971  MR#: [id number]  AOZ#:[id number]  Patient Care Team: Alba Cory, MD as PCP - General (Family Medicine) Jeralyn Ruths, MD as Consulting Physician (Oncology) Zettie Pho, [facility] (Inactive) as Pharmacist (Pharmacist) Gustavus Bryant, LCSW as [facility] Care Management (Licensed Clinical Social Worker) Keitha Butte, RN as Registered Nurse (Oncology) Borders, Daryl Eastern, NP as Nurse Practitioner (Hospice and Palliative Medicine)  CHIEF COMPLAINT: CLL.  INTERVAL HISTORY: Patient returns to clinic today for further evaluation and continuation of Rituxan and Revlimid.  She has noticed some increased tenderness in her left axilla, but no palpable enlarging lymph nodes.  She also complains of worsening fatigue over the past several weeks.  She otherwise feels well. She continues to have a chronic peripheral neuropathy.  She is tolerating her treatments without significant side effects.  She has no other neurologic complaints.  She has a good appetite.  She has no chest pain, shortness of breath, cough, or hemoptysis.  She denies any nausea, vomiting, or constipation.  She has no melena or hematochezia.  She has no urinary complaints.  Patient offers no further specific complaints today.  REVIEW OF SYSTEMS:   Review of Systems  Constitutional:  Positive for malaise/fatigue. Negative for diaphoresis, fever and weight loss.  HENT:  Negative for congestion.   Respiratory: Negative.  Negative for cough and shortness of breath.   Cardiovascular: Negative.  Negative for chest pain and leg swelling.  Gastrointestinal: Negative.  Negative for abdominal pain, constipation and diarrhea.  Genitourinary: Negative.  Negative for dysuria and frequency.  Musculoskeletal: Negative.  Negative for back pain, myalgias and neck pain.  Skin: Negative.  Negative for rash.   Neurological:  Positive for tingling and sensory change. Negative for focal weakness and weakness.  Psychiatric/Behavioral: Negative.  Negative for depression and suicidal ideas. The patient is not nervous/anxious.     As per HPI. Otherwise, a complete review of systems is negative.  PAST MEDICAL HISTORY: Past Medical History:  Diagnosis Date   Anxiety    Bursitis    leg pain   Carpal tunnel syndrome    Cervical dysplasia    hx LEEP over 18 years ago.    Chronic lymphocytic leukemia (HCC) 2018   Dr Orlie Dakin.  (in lymph nodes)   COPD (chronic obstructive pulmonary disease) (HCC)    COVID-19 2021   Depression    Eating disorder    GERD (gastroesophageal reflux disease)    History of self-harm    Insomnia    Obsession    Tobacco abuse    Vitamin B12 deficiency (non anemic)     PAST SURGICAL HISTORY: Past Surgical History:  Procedure Laterality Date   BREAST BIOPSY Right 01/05/2018   US guided biopsy of 2 areas and 1 lymph node, MIXED INFLAMMATION AND GIANT CELL REACTION   CERVICAL BIOPSY  W/ LOOP ELECTRODE EXCISION     COLONOSCOPY WITH PROPOFOL N/A 04/21/2019   Procedure: COLONOSCOPY WITH PROPOFOL;  Surgeon: Pasty Spillers, MD;  Location: [facility] ENDOSCOPY;  Service: Gastroenterology;  Laterality: N/A;   OTHER SURGICAL HISTORY     scar tissue removed from vocal cords   TUBAL LIGATION     VAGINAL HYSTERECTOMY N/A 01/22/2021   Procedure: HYSTERECTOMY VAGINAL; BILATERAL SALPINGECTOMY;  Surgeon: Hildred Laser, MD;  Location: [facility] ORS;  Service: Gynecology;  Laterality: N/A;   vocal cord surgery  2005    FAMILY HISTORY: Family History  Problem Relation Age of Onset   Depression Mother    Cancer Mother        thyroid   Alcohol abuse Father    COPD Father    Alcohol abuse Brother    Depression Brother    Bipolar disorder Brother    Suicidality Brother    ADD / ADHD Son    Breast cancer Neg Hx     ADVANCED DIRECTIVES (Y/N):  N  HEALTH MAINTENANCE: Social History    Tobacco Use   Smoking status: Every Day    Packs/day: 1.00    Years: 31.00    Additional pack years: 0.00    Total pack years: 31.00    Types: Cigarettes    Start date: 09/25/1986   Smokeless tobacco: Never  Vaping Use   Vaping Use: Former  Substance Use Topics   Alcohol use: Not Currently    Alcohol/week: 0.0 standard drinks of alcohol    Comment: rarely   Drug use: Yes    Frequency: 7.0 times per week    Types: Marijuana     Colonoscopy:  PAP:  Bone density:  Lipid panel:  Allergies  Allergen Reactions   Doxycycline Rash    Rash on cheeks and sides of nose     Current Outpatient Medications  Medication Sig Dispense Refill   albuterol (VENTOLIN HFA) 108 (90 Base) MCG/ACT inhaler Inhale 2 puffs into the lungs every 6 (six) hours as needed for wheezing or shortness of breath. 8 g 0   amoxicillin-clavulanate (AUGMENTIN) 875-125 MG tablet Take 1 tablet by mouth 2 (two) times daily. 20 tablet 0   ARIPiprazole (ABILIFY) 5 MG tablet Take 1 tablet (5 mg total) by mouth daily. 90 tablet 1   Ascorbic Acid (VITAMIN C) 1000 MG tablet Take 1,000 mg by mouth daily as needed (immune health).     ASPIRIN 81 PO Take 81 mg by mouth daily.     atorvastatin (LIPITOR) 10 MG tablet Take 1 tablet (10 mg total) by mouth in the morning. 90 tablet 1   benzonatate (TESSALON) 100 MG capsule Take 1-2 capsules (100-200 mg total) by mouth 2 (two) times daily as needed. 40 capsule 0   Calcium Carbonate-Vit D-Min (GNP CALCIUM 1200) 1200-1000 MG-UNIT CHEW Chew 1,200 mg by mouth daily with breakfast. Take in combination with vitamin D and magnesium. 90 tablet 3   chlorpheniramine-HYDROcodone (TUSSIONEX) 10-8 MG/5ML Take 5 mLs by mouth at bedtime as needed for cough. 115 mL 0   lenalidomide (REVLIMID) 10 MG capsule Take 1 capsule (10 mg total) by mouth daily. 28 capsule 0   loratadine (CLARITIN) 10 MG tablet Take 1 tablet (10 mg total) by mouth every morning. 90 tablet 1   Magnesium 500 MG CAPS Take 500  mg by mouth in the morning.     meloxicam (MOBIC) 15 MG tablet TAKE ONE TABLET BY MOUTH EVERY MORNING 90 tablet 1   omeprazole (PRILOSEC) 20 MG capsule Take 1 capsule (20 mg total) by mouth every morning. 90 capsule 1   pregabalin (LYRICA) 150 MG capsule Take 150 mg by mouth 2 (two) times daily.     PREMARIN 0.625 MG tablet TAKE ONE TABLET BY MOUTH ONCE daily FOR 21 DAYS THEN DO not take FOR 7 days 90 tablet 3   tiZANidine (ZANAFLEX) 2 MG tablet Take 1 tablet (2 mg total) by mouth at bedtime. 90 tablet 1   umeclidinium-vilanterol (ANORO ELLIPTA) 62.5-25 MCG/ACT AEPB Inhale 1 puff into the lungs daily. 60 each 5  vitamin B-12 (CYANOCOBALAMIN) 500 MCG tablet Take 250 mcg by mouth every morning.     pregabalin (LYRICA) 100 MG capsule Take 100 mg by mouth 3 (three) times daily. (Patient not taking: Reported on 03/07/2023)     No current facility-administered medications for this visit.    OBJECTIVE: Vitals:   03/07/23 0900 03/07/23 0908  BP: (!) 77/35 (!) 92/42  Pulse: 73 71  Resp: 18   Temp: (!) 97 F (36.1 C)   SpO2:  99%      Body mass index is 28.77 kg/m.    ECOG FS:0 - Asymptomatic  General: Well-developed, well-nourished, no acute distress. Eyes: Pink conjunctiva, anicteric sclera. HEENT: Normocephalic, moist mucous membranes. Lungs: No audible wheezing or coughing. Heart: Regular rate and rhythm. Abdomen: Soft, nontender, no obvious distention. Musculoskeletal: No edema, cyanosis, or clubbing. Neuro: Alert, answering all questions appropriately. Cranial nerves grossly intact. Skin: No rashes or petechiae noted. Psych: Normal affect. Lymphatics: No palpable lymphadenopathy.  LAB RESULTS:  Lab Results  Component Value Date   NA 137 03/07/2023   K 4.2 03/07/2023   CL 103 03/07/2023   CO2 25 03/07/2023   GLUCOSE 88 03/07/2023   BUN 18 03/07/2023   CREATININE 1.14 (H) 03/07/2023   CALCIUM 8.8 (L) 03/07/2023   PROT 6.4 (L) 03/07/2023   ALBUMIN 3.9 03/07/2023   AST 19  03/07/2023   ALT 20 03/07/2023   ALKPHOS 67 03/07/2023   BILITOT 0.7 03/07/2023   GFRNONAA 58 (L) 03/07/2023   GFRAA 79 12/06/2020    Lab Results  Component Value Date   WBC 4.0 03/07/2023   NEUTROABS 1.7 03/07/2023   HGB 13.6 03/07/2023   HCT 39.5 03/07/2023   MCV 100.3 (H) 03/07/2023   PLT 122 (L) 03/07/2023     STUDIES: No results found.  ONCOLOGY HISTORY:  Patient completed cycle 3 of Rituxan plus Treanda on March 26, 2018, but was then noted to have progressive disease.  She was then initiated on 480 mg Imbruvica in November 2019, but had progression of disease with increasing white blood cell count as well as CT scan on Jan 22, 2022 revealing increased lymphadenopathy consistent with progression of disease.  Attempted patient on venetoclax, but she could not tolerate it secondary to persistent diarrhea and transaminitis.   ASSESSMENT: CLL.  PLAN:    CLL: Confirmed by peripheral blood flow cytometry.  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  Patient completed 4 weekly cycles of Rituxan and now has transitioned to treatment every 4 weeks.  Her most recent CT scan on December 09, 2022 reviewed independently with stable to improved disease burden.  Continue current treatments at this time.  Continue Claritin and famotidine along with her 10 mg Revlimid daily.  Patient has discontinued prednisone.  Proceed with cycle 15 of treatment today.  Return to clinic in 4 weeks for further evaluation and consideration of cycle 16.  Will reimage prior to next infusion.   Leukopenia: Resolved. Thrombocytopenia: Chronic and change.  Patient's platelet count is 122.   Trigeminal neuralgia: Resolved.  MRI of the brain on Jan 25, 2019 was unremarkable.   Depression/anxiety: Chronic and unchanged.  Continue follow-up and treatment per primary care. Neuropathic pain/peripheral neuropathy: Chronic and unchanged.  She reports she  is now on disability.  Continue current follow-up and treatment as per neurology. Rituxan reaction: Patient has been given additional premedications for preventative measures.  Rash: Resolved.  Continue Claritin and famotidine along with Revlimid.  Weight gain: Patient has discontinued prednisone. Fatigue/axillary tenderness: CT scans as above.  Patient expressed understanding and was in agreement with this plan. She also understands that She can call clinic at any time with any questions, concerns, or complaints.    Jeralyn Ruths, MD   03/07/2023 9:47 AM

## 2023-03-21 ENCOUNTER — Ambulatory Visit: Payer: Medicare PPO

## 2023-03-21 ENCOUNTER — Ambulatory Visit
Admission: RE | Admit: 2023-03-21 | Discharge: 2023-03-21 | Disposition: A | Discharge status: Home / Self Care | Payer: Medicare PPO | Source: Ambulatory Visit | Attending: Oncology | Admitting: Oncology

## 2023-03-21 DIAGNOSIS — C9110 Chronic lymphocytic leukemia of B-cell type not having achieved remission: Secondary | ICD-10-CM | POA: Diagnosis not present

## 2023-03-21 MED ORDER — IOHEXOL 350 MG/ML SOLN
100.0000 mL | Freq: Once | INTRAVENOUS | Status: AC | PRN
  Administered 2023-03-21: 100 mL via INTRAVENOUS

## 2023-03-24 ENCOUNTER — Other Ambulatory Visit: Payer: Self-pay | Admitting: Oncology

## 2023-03-24 ENCOUNTER — Other Ambulatory Visit: Payer: Self-pay | Admitting: Family Medicine

## 2023-03-24 ENCOUNTER — Telehealth: Payer: Self-pay | Admitting: Family Medicine

## 2023-03-24 DIAGNOSIS — C9110 Chronic lymphocytic leukemia of B-cell type not having achieved remission: Secondary | ICD-10-CM

## 2023-03-24 MED ORDER — BEVESPI AEROSPHERE 9-4.8 MCG/ACT IN AERO: 2.0000 | INHALATION_SPRAY | Freq: Every day | RESPIRATORY_TRACT | 3 refills | Status: DC

## 2023-03-24 NOTE — Telephone Encounter (Signed)
Called patient and made aware. Patient verbalized understanding.

## 2023-03-24 NOTE — Telephone Encounter (Signed)
Disp Refills Start End   umeclidinium-vilanterol (ANORO ELLIPTA) 62.5-25 MCG/ACT AEPB       Pt states that due to her ins she is not able to get this approved right now as pending and does not have an inhaler right now. She wants Dr Kathie Rhodes to give her another inhaler comparable that ins will cover. She want a fu call today. [phone] Pt is very anxious. She states that ins has been trying to get in touch.Monique [name]

## 2023-03-25 ENCOUNTER — Encounter: Payer: Self-pay | Admitting: Oncology

## 2023-04-03 ENCOUNTER — Ambulatory Visit: Payer: Medicare Other | Admitting: Oncology

## 2023-04-03 ENCOUNTER — Other Ambulatory Visit: Payer: Medicare Other

## 2023-04-03 ENCOUNTER — Ambulatory Visit: Payer: Medicare Other

## 2023-04-03 ENCOUNTER — Inpatient Hospital Stay: Payer: Medicare PPO | Attending: Oncology | Admitting: Pharmacist

## 2023-04-03 DIAGNOSIS — Z79899 Other long term (current) drug therapy: Secondary | ICD-10-CM | POA: Insufficient documentation

## 2023-04-03 DIAGNOSIS — C9110 Chronic lymphocytic leukemia of B-cell type not having achieved remission: Secondary | ICD-10-CM | POA: Insufficient documentation

## 2023-04-03 DIAGNOSIS — Z5112 Encounter for antineoplastic immunotherapy: Secondary | ICD-10-CM | POA: Insufficient documentation

## 2023-04-03 MED FILL — Dexamethasone Sodium Phosphate Inj 100 MG/10ML: INTRAMUSCULAR | Qty: 2 | Status: AC

## 2023-04-04 ENCOUNTER — Inpatient Hospital Stay: Payer: Medicare PPO

## 2023-04-04 ENCOUNTER — Inpatient Hospital Stay (HOSPITAL_BASED_OUTPATIENT_CLINIC_OR_DEPARTMENT_OTHER): Payer: Medicare PPO | Admitting: Oncology

## 2023-04-04 ENCOUNTER — Encounter: Payer: Self-pay | Admitting: Oncology

## 2023-04-04 ENCOUNTER — Inpatient Hospital Stay: Payer: Medicare PPO | Admitting: Oncology

## 2023-04-04 VITALS — BP 102/55 | HR 61 | Temp 97.5°F | Resp 16

## 2023-04-04 DIAGNOSIS — C9110 Chronic lymphocytic leukemia of B-cell type not having achieved remission: Secondary | ICD-10-CM

## 2023-04-04 DIAGNOSIS — Z5112 Encounter for antineoplastic immunotherapy: Secondary | ICD-10-CM | POA: Diagnosis present

## 2023-04-04 DIAGNOSIS — Z79899 Other long term (current) drug therapy: Secondary | ICD-10-CM | POA: Diagnosis not present

## 2023-04-04 LAB — CBC WITH DIFFERENTIAL/PLATELET
Abs Immature Granulocytes: 0.02 10*3/uL (ref 0.00–0.07)
Basophils Absolute: 0.1 10*3/uL (ref 0.0–0.1)
Basophils Relative: 2 %
Eosinophils Absolute: 0.2 10*3/uL (ref 0.0–0.5)
Eosinophils Relative: 7 %
HCT: 38.2 % (ref 36.0–46.0)
Hemoglobin: 13.4 g/dL (ref 12.0–15.0)
Immature Granulocytes: 1 %
Lymphocytes Relative: 48 %
Lymphs Abs: 1.4 10*3/uL (ref 0.7–4.0)
MCH: 35.1 pg — ABNORMAL HIGH (ref 26.0–34.0)
MCHC: 35.1 g/dL (ref 30.0–36.0)
MCV: 100 fL (ref 80.0–100.0)
Monocytes Absolute: 0.3 10*3/uL (ref 0.1–1.0)
Monocytes Relative: 10 %
Neutro Abs: 0.9 10*3/uL — ABNORMAL LOW (ref 1.7–7.7)
Neutrophils Relative %: 32 %
Platelets: 98 10*3/uL — ABNORMAL LOW (ref 150–400)
RBC: 3.82 MIL/uL — ABNORMAL LOW (ref 3.87–5.11)
RDW: 13.8 % (ref 11.5–15.5)
WBC: 2.9 10*3/uL — ABNORMAL LOW (ref 4.0–10.5)
nRBC: 0 % (ref 0.0–0.2)

## 2023-04-04 LAB — COMPREHENSIVE METABOLIC PANEL
ALT: 19 U/L (ref 0–44)
AST: 17 U/L (ref 15–41)
Albumin: 3.7 g/dL (ref 3.5–5.0)
Alkaline Phosphatase: 75 U/L (ref 38–126)
Anion gap: 8 (ref 5–15)
BUN: 17 mg/dL (ref 6–20)
CO2: 24 mmol/L (ref 22–32)
Calcium: 8.6 mg/dL — ABNORMAL LOW (ref 8.9–10.3)
Chloride: 106 mmol/L (ref 98–111)
Creatinine, Ser: 1.12 mg/dL — ABNORMAL HIGH (ref 0.44–1.00)
GFR, Estimated: 59 mL/min — ABNORMAL LOW (ref >60)
Glucose, Bld: 83 mg/dL (ref 70–99)
Potassium: 4.3 mmol/L (ref 3.5–5.1)
Sodium: 138 mmol/L (ref 135–145)
Total Bilirubin: 0.6 mg/dL (ref 0.3–1.2)
Total Protein: 6 g/dL — ABNORMAL LOW (ref 6.5–8.1)

## 2023-04-04 MED ORDER — ACETAMINOPHEN 325 MG PO TABS
650.0000 mg | ORAL_TABLET | Freq: Once | ORAL | Status: AC
  Administered 2023-04-04: 650 mg via ORAL

## 2023-04-04 MED ORDER — SODIUM CHLORIDE 0.9 % IV SOLN
375.0000 mg/m2 | Freq: Once | INTRAVENOUS | Status: AC
  Administered 2023-04-04: 700 mg via INTRAVENOUS
  Filled 2023-04-04: qty 50

## 2023-04-04 MED ORDER — DIPHENHYDRAMINE HCL 25 MG PO CAPS
50.0000 mg | ORAL_CAPSULE | Freq: Once | ORAL | Status: AC
  Administered 2023-04-04: 50 mg via ORAL

## 2023-04-04 MED ORDER — FAMOTIDINE IN NACL 20-0.9 MG/50ML-% IV SOLN
20.0000 mg | Freq: Once | INTRAVENOUS | Status: AC
  Administered 2023-04-04: 20 mg via INTRAVENOUS

## 2023-04-04 MED ORDER — SODIUM CHLORIDE 0.9 % IV SOLN
Freq: Once | INTRAVENOUS | Status: AC
  Filled 2023-04-04: qty 250

## 2023-04-04 MED ORDER — SODIUM CHLORIDE 0.9 % IV SOLN
20.0000 mg | Freq: Once | INTRAVENOUS | Status: AC
  Administered 2023-04-04: 20 mg via INTRAVENOUS
  Filled 2023-04-04: qty 2

## 2023-04-04 NOTE — Progress Notes (Signed)
[facility]  [phone] [phone]  ID: Monique [name] OB: 1971-08-26  MR#: [id number]  AOZ#:[id number]  Patient Care Team: Alba Cory, MD as PCP - General (Family Medicine) Jeralyn Ruths, MD as Consulting Physician (Oncology) Zettie Pho, [facility] (Inactive) as Pharmacist (Pharmacist) Gustavus Bryant, LCSW as [facility] Care Management (Licensed Clinical Social Worker) Keitha Butte, RN as Registered Nurse (Oncology) Borders, Daryl Eastern, NP as Nurse Practitioner (Hospice and Palliative Medicine)  CHIEF COMPLAINT: CLL.  INTERVAL HISTORY: Patient returns to clinic today for further evaluation and continuation of Rituxan and Revlimid.  She continues to have mild tenderness in her left axilla, but otherwise feels well.  She does not complain of weakness or fatigue today.  She continues to have a chronic peripheral neuropathy.  She is tolerating her treatments without significant side effects.  She has no other neurologic complaints.  She has a good appetite.  She has no chest pain, shortness of breath, cough, or hemoptysis.  She denies any nausea, vomiting, or constipation.  She has no melena or hematochezia.  She has no urinary complaints.  Patient offers no further specific complaints today.  REVIEW OF SYSTEMS:   Review of Systems  Constitutional: Negative.  Negative for diaphoresis, fever, malaise/fatigue and weight loss.  HENT:  Negative for congestion.   Respiratory: Negative.  Negative for cough and shortness of breath.   Cardiovascular: Negative.  Negative for chest pain and leg swelling.  Gastrointestinal: Negative.  Negative for abdominal pain, constipation and diarrhea.  Genitourinary: Negative.  Negative for dysuria and frequency.  Musculoskeletal: Negative.  Negative for back pain, myalgias and neck pain.  Skin: Negative.  Negative for rash.  Neurological:  Positive for tingling and sensory change. Negative  for focal weakness and weakness.  Psychiatric/Behavioral: Negative.  Negative for depression and suicidal ideas. The patient is not nervous/anxious.     As per HPI. Otherwise, a complete review of systems is negative.  PAST MEDICAL HISTORY: Past Medical History:  Diagnosis Date   Anxiety    Bursitis    leg pain   Carpal tunnel syndrome    Cervical dysplasia    hx LEEP over 18 years ago.    Chronic lymphocytic leukemia (HCC) 2018   Dr Orlie Dakin.  (in lymph nodes)   COPD (chronic obstructive pulmonary disease) (HCC)    COVID-19 2021   Depression    Eating disorder    GERD (gastroesophageal reflux disease)    History of self-harm    Insomnia    Obsession    Tobacco abuse    Vitamin B12 deficiency (non anemic)     PAST SURGICAL HISTORY: Past Surgical History:  Procedure Laterality Date   BREAST BIOPSY Right 01/05/2018   US guided biopsy of 2 areas and 1 lymph node, MIXED INFLAMMATION AND GIANT CELL REACTION   CERVICAL BIOPSY  W/ LOOP ELECTRODE EXCISION     COLONOSCOPY WITH PROPOFOL N/A 04/21/2019   Procedure: COLONOSCOPY WITH PROPOFOL;  Surgeon: Pasty Spillers, MD;  Location: [facility] ENDOSCOPY;  Service: Gastroenterology;  Laterality: N/A;   OTHER SURGICAL HISTORY     scar tissue removed from vocal cords   TUBAL LIGATION     VAGINAL HYSTERECTOMY N/A 01/22/2021   Procedure: HYSTERECTOMY VAGINAL; BILATERAL SALPINGECTOMY;  Surgeon: Hildred Laser, MD;  Location: [facility] ORS;  Service: Gynecology;  Laterality: N/A;   vocal cord surgery  2005    FAMILY HISTORY: Family History  Problem Relation Age of Onset   Depression  Mother    Cancer Mother        thyroid   Alcohol abuse Father    COPD Father    Alcohol abuse Brother    Depression Brother    Bipolar disorder Brother    Suicidality Brother    ADD / ADHD Son    Breast cancer Neg Hx     ADVANCED DIRECTIVES (Y/N):  N  HEALTH MAINTENANCE: Social History   Tobacco Use   Smoking status: Every Day    Current packs/day:  1.00    Average packs/day: 1 pack/day for 36.5 years (36.5 ttl pk-yrs)    Types: Cigarettes    Start date: 09/25/1986   Smokeless tobacco: Never  Vaping Use   Vaping status: Former  Substance Use Topics   Alcohol use: Not Currently    Alcohol/week: 0.0 standard drinks of alcohol    Comment: rarely   Drug use: Yes    Frequency: 7.0 times per week    Types: Marijuana     Colonoscopy:  PAP:  Bone density:  Lipid panel:  Allergies  Allergen Reactions   Doxycycline Rash    Rash on cheeks and sides of nose     Current Outpatient Medications  Medication Sig Dispense Refill   albuterol (VENTOLIN HFA) 108 (90 Base) MCG/ACT inhaler Inhale 2 puffs into the lungs every 6 (six) hours as needed for wheezing or shortness of breath. 8 g 0   ARIPiprazole (ABILIFY) 5 MG tablet Take 1 tablet (5 mg total) by mouth daily. 90 tablet 1   Ascorbic Acid (VITAMIN C) 1000 MG tablet Take 1,000 mg by mouth daily as needed (immune health).     ASPIRIN 81 PO Take 81 mg by mouth daily.     atorvastatin (LIPITOR) 10 MG tablet Take 1 tablet (10 mg total) by mouth in the morning. 90 tablet 1   Glycopyrrolate-Formoterol (BEVESPI AEROSPHERE) 9-4.8 MCG/ACT AERO Inhale 2 puffs into the lungs daily. 10.7 g 3   lenalidomide (REVLIMID) 10 MG capsule TAKE 1 CAPSULE (10MG  TOTAL) BY MOUTH DAILY 28 capsule 0   loratadine (CLARITIN) 10 MG tablet Take 1 tablet (10 mg total) by mouth every morning. 90 tablet 1   Magnesium 500 MG CAPS Take 500 mg by mouth in the morning.     meloxicam (MOBIC) 15 MG tablet TAKE ONE TABLET BY MOUTH EVERY MORNING 90 tablet 1   omeprazole (PRILOSEC) 20 MG capsule Take 1 capsule (20 mg total) by mouth every morning. 90 capsule 1   pregabalin (LYRICA) 150 MG capsule Take 150 mg by mouth 2 (two) times daily.     PREMARIN 0.625 MG tablet TAKE ONE TABLET BY MOUTH ONCE daily FOR 21 DAYS THEN DO not take FOR 7 days 90 tablet 3   tiZANidine (ZANAFLEX) 2 MG tablet Take 1 tablet (2 mg total) by mouth at  bedtime. 90 tablet 1   vitamin B-12 (CYANOCOBALAMIN) 500 MCG tablet Take 250 mcg by mouth every morning.     Calcium Carbonate-Vit D-Min (GNP CALCIUM 1200) 1200-1000 MG-UNIT CHEW Chew 1,200 mg by mouth daily with breakfast. Take in combination with vitamin D and magnesium. 90 tablet 3   pregabalin (LYRICA) 100 MG capsule Take 100 mg by mouth 3 (three) times daily. (Patient not taking: Reported on 03/07/2023)     No current facility-administered medications for this visit.    OBJECTIVE: Vitals:   04/04/23 1120  BP: (!) 106/46  Pulse: 70  Resp: 18  Temp: (!) 97.4 F (36.3 C)  SpO2: 100%      Body mass index is 29.13 kg/m.    ECOG FS:0 - Asymptomatic  General: Well-developed, well-nourished, no acute distress. Eyes: Pink conjunctiva, anicteric sclera. HEENT: Normocephalic, moist mucous membranes. Lungs: No audible wheezing or coughing. Heart: Regular rate and rhythm. Abdomen: Soft, nontender, no obvious distention. Musculoskeletal: No edema, cyanosis, or clubbing. Neuro: Alert, answering all questions appropriately. Cranial nerves grossly intact. Skin: No rashes or petechiae noted. Psych: Normal affect.  LAB RESULTS:  Lab Results  Component Value Date   NA 138 04/04/2023   K 4.3 04/04/2023   CL 106 04/04/2023   CO2 24 04/04/2023   GLUCOSE 83 04/04/2023   BUN 17 04/04/2023   CREATININE 1.12 (H) 04/04/2023   CALCIUM 8.6 (L) 04/04/2023   PROT 6.0 (L) 04/04/2023   ALBUMIN 3.7 04/04/2023   AST 17 04/04/2023   ALT 19 04/04/2023   ALKPHOS 75 04/04/2023   BILITOT 0.6 04/04/2023   GFRNONAA 59 (L) 04/04/2023   GFRAA 79 12/06/2020    Lab Results  Component Value Date   WBC 2.9 (L) 04/04/2023   NEUTROABS 0.9 (L) 04/04/2023   HGB 13.4 04/04/2023   HCT 38.2 04/04/2023   MCV 100.0 04/04/2023   PLT 98 (L) 04/04/2023     STUDIES: CT SOFT TISSUE NECK W CONTRAST  Result Date: 03/29/2023 CLINICAL DATA:  Hematologic malignancy, staging restaging CLL EXAM: CT NECK WITH  CONTRAST TECHNIQUE: Multidetector CT imaging of the neck was performed using the standard protocol following the bolus administration of intravenous contrast. RADIATION DOSE REDUCTION: This exam was performed according to the departmental dose-optimization program which includes automated exposure control, adjustment of the mA and/or kV according to patient size and/or use of iterative reconstruction technique. CONTRAST:  OMNIPAQUE IOHEXOL 350 MG/ML SOLN COMPARISON:  CT neck December 09, 2022. FINDINGS: Pharynx and larynx: Normal. No mass or swelling. Salivary glands: No inflammation, mass, or stone.  The Thyroid: Normal. Lymph nodes: Interval increase in size of supraclavicular lymph nodes bilaterally, for example left supraclavicular node measures 12 mm (previously 9) on series 2, image 71. Many of the cervical chain nodes are similar, for example no just under the sternocleidomastoid the upper neck measures 9 x 5 mm on series 2, image 28 which is similar. Additional level V node measures similar, 7 x 6 mm on series 2, image 40. Vascular: Major arteries are grossly patent. Limited intracranial: Negative. Visualized orbits: Negative. Mastoids and visualized paranasal sinuses: Clear. Skeleton: No acute or aggressive process. Upper chest: Evaluated on same day CT chest. IMPRESSION: Mild interval increase in supraclavicular nodes. Additional cervical chain lymph nodes are similar. Electronically Signed   By: Feliberto Harts M.D.   On: 03/29/2023 13:46   CT CHEST ABDOMEN PELVIS W CONTRAST  Result Date: 03/21/2023 CLINICAL DATA:  Chronic lymphocytic leukemia. LEFT axillary tenderness. * Tracking Code: BO * EXAM: CT CHEST, ABDOMEN, AND PELVIS WITH CONTRAST TECHNIQUE: Multidetector CT imaging of the chest, abdomen and pelvis was performed following the standard protocol during bolus administration of intravenous contrast. RADIATION DOSE REDUCTION: This exam was performed according to the departmental  dose-optimization program which includes automated exposure control, adjustment of the mA and/or kV according to patient size and/or use of iterative reconstruction technique. CONTRAST:  OMNIPAQUE IOHEXOL 350 MG/ML SOLN COMPARISON:  CT 12/09/2022 FINDINGS: CT CHEST FINDINGS Cardiovascular: No significant vascular findings. Normal heart size. No pericardial effusion. Mediastinum/Nodes: Enlarged mediastinal lymph nodes appears slightly increased in volume. Example RIGHT lower paratracheal node measuring 15  mm short axis (image 21/2) compares to 12 mm short axis. Subcarinal node measures 11 mm compared to 12 mm. No new lymph nodes. Bilateral axillary lymph nodes again noted. High LEFT axillary node measuring 20 mm (image 10/2) is increased from 13 mm. Lower axillary node measuring 13 mm compares to 14 mm. Sub pectoralis nodal LEFT LEFT measures 14 mm compared to 12 mm. RIGHT axillary nodes measure up to 12 mm compared to 12 mm. Lungs/Pleura: No suspicious pulmonary nodules. Normal pleural. Airways normal. Musculoskeletal: No aggressive osseous lesion. CT ABDOMEN AND PELVIS FINDINGS Hepatobiliary: No focal hepatic lesion. No biliary ductal dilatation. Gallbladder is normal. Common bile duct is normal. Pancreas: Pancreas is normal. No ductal dilatation. No pancreatic inflammation. Spleen: The spleen is normal volume measuring 10 cm in craniocaudad dimension Adrenals/urinary tract: Adrenal glands and kidneys are normal. The ureters and bladder normal. Stomach/Bowel: Stomach, small bowel, appendix, and cecum are normal. The colon and rectosigmoid colon are normal. Vascular/Lymphatic: Abdominal is normal caliber. Bulky periaortic enlarged lymph nodes again noted. Nodular mass LEFT aorta measures 27 mm (image 82/2) compared to 21 mm. RIGHT common iliac node measuring 18 mm (image 98) compares to 16 mm. No inguinal adenopathy. Reproductive: Post hysterectomy.  Adnexa unremarkable Other: No free fluid. Musculoskeletal:  No aggressive osseous lesion. IMPRESSION: CHEST: 1. Slight increase in volume of mediastinal and axillary adenopathy. 2. No new lymph nodes. 3. No pulmonary nodules. PELVIS: 1. Slight increase in volume of periaortic and RIGHT common iliac adenopathy. 2. No new lymph nodes. 3. Normal spleen. Electronically Signed   By: Genevive Bi M.D.   On: 03/21/2023 12:52    ONCOLOGY HISTORY:  Patient completed cycle 3 of Rituxan plus Treanda on March 26, 2018, but was then noted to have progressive disease.  She was then initiated on 480 mg Imbruvica in November 2019, but had progression of disease with increasing white blood cell count as well as CT scan on Jan 22, 2022 revealing increased lymphadenopathy consistent with progression of disease.  Attempted patient on venetoclax, but she could not tolerate it secondary to persistent diarrhea and transaminitis.   ASSESSMENT: CLL.  PLAN:    CLL: Confirmed by peripheral blood flow cytometry.  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  Patient completed 4 weekly cycles of Rituxan and now has transitioned to treatment every 4 weeks.  Her most recent CT scan on March 21, 2023 reviewed independently and report as above with only minimal progression of disease with 1 or 2 mm growth in multiple lymph nodes.  Her left axillary lymph node did increase from 13 mm to 20 mm and is symptomatic.  She was even a referral to radiation oncology for consideration of treatment of the left axillary node.  Continue Revlimid and Rituxan at this time. Continue Claritin and famotidine along with her 10 mg Revlimid daily.  Patient has discontinued prednisone.  Proceed with cycle 16 of treatment today.  Return to clinic in 4 weeks for further evaluation and consideration of cycle 17.   Leukopenia: Total white blood cell count decreased to 2.9.  Monitor. Thrombocytopenia: Chronic and unchanged.  Patient's platelet count  is 98 today.   Trigeminal neuralgia: Resolved.  MRI of the brain on Jan 25, 2019 was unremarkable.   Depression/anxiety: Chronic and unchanged.  Continue follow-up and treatment per primary care. Neuropathic pain/peripheral neuropathy: Chronic and unchanged.  She reports she is now on disability.  Continue current follow-up and treatment as per neurology. Rituxan reaction: Patient has been given additional premedications for preventative measures.  Rash: Resolved.  Continue Claritin and famotidine along with Revlimid.  Weight gain: Patient has discontinued prednisone. Fatigue/axillary tenderness: CT scans as above.  Referral to radiation oncology.  Patient expressed understanding and was in agreement with this plan. She also understands that She can call clinic at any time with any questions, concerns, or complaints.    Jeralyn Ruths, MD   04/04/2023 2:29 PM

## 2023-04-04 NOTE — Patient Instructions (Signed)
[facility] [place]  Discharge Instructions: Thank you for choosing [facility] to provide your oncology and hematology care.  If you have a lab appointment with the [facility], please go directly to the [facility] and check in at the registration area.  Wear comfortable clothing and clothing appropriate for easy access to any Portacath or PICC line.   We strive to give you quality time with your provider. You may need to reschedule your appointment if you arrive late (15 or more minutes).  Arriving late affects you and other patients whose appointments are after yours.  Also, if you miss three or more appointments without notifying the office, you may be dismissed from the clinic at the provider's discretion.      For prescription refill requests, have your pharmacy contact our office and allow 72 hours for refills to be completed.    Today you received the following chemotherapy and/or immunotherapy agents Ruxience      To help prevent nausea and vomiting after your treatment, we encourage you to take your nausea medication as directed.  BELOW ARE SYMPTOMS THAT SHOULD BE REPORTED IMMEDIATELY: *FEVER GREATER THAN 100.4 F (38 C) OR HIGHER *CHILLS OR SWEATING *NAUSEA AND VOMITING THAT IS NOT CONTROLLED WITH YOUR NAUSEA MEDICATION *UNUSUAL SHORTNESS OF BREATH *UNUSUAL BRUISING OR BLEEDING *URINARY PROBLEMS (pain or burning when urinating, or frequent urination) *BOWEL PROBLEMS (unusual diarrhea, constipation, pain near the anus) TENDERNESS IN MOUTH AND THROAT WITH OR WITHOUT PRESENCE OF ULCERS (sore throat, sores in mouth, or a toothache) UNUSUAL RASH, SWELLING OR PAIN  UNUSUAL VAGINAL DISCHARGE OR ITCHING   Items with * indicate a potential emergency and should be followed up as soon as possible or go to the Emergency Department if any problems should occur.  Please show the CHEMOTHERAPY ALERT CARD or IMMUNOTHERAPY ALERT CARD at check-in to  the Emergency Department and triage nurse.  Should you have questions after your visit or need to cancel or reschedule your appointment, please contact [facility]  [phone] and follow the prompts.  Office hours are 8:00 a.m. to 4:30 p.m. Monday - Friday. Please note that voicemails left after 4:00 p.m. may not be returned until the following business day.  We are closed weekends and major holidays. You have access to a nurse at all times for urgent questions. Please call the main number to the clinic [phone] and follow the prompts.  For any non-urgent questions, you may also contact your provider using MyChart. We now offer e-Visits for anyone 63 and older to request care online for non-urgent symptoms. For details visit [email or web address].   Also download the MyChart app! Go to the app store, search "MyChart", open the app, select [place], and log in with your MyChart username and password.

## 2023-04-08 ENCOUNTER — Other Ambulatory Visit: Payer: Self-pay | Admitting: *Deleted

## 2023-04-08 ENCOUNTER — Encounter: Payer: Self-pay | Admitting: Radiation Oncology

## 2023-04-08 ENCOUNTER — Ambulatory Visit
Admission: RE | Admit: 2023-04-08 | Discharge: 2023-04-08 | Disposition: A | Discharge status: Home / Self Care | Payer: Medicare PPO | Source: Ambulatory Visit | Attending: Radiation Oncology | Admitting: Radiation Oncology

## 2023-04-08 VITALS — BP 104/72 | HR 72 | Temp 97.3°F | Resp 16 | Ht 67.0 in | Wt 186.2 lb

## 2023-04-08 DIAGNOSIS — Z8616 Personal history of COVID-19: Secondary | ICD-10-CM | POA: Insufficient documentation

## 2023-04-08 DIAGNOSIS — Z791 Long term (current) use of non-steroidal anti-inflammatories (NSAID): Secondary | ICD-10-CM | POA: Insufficient documentation

## 2023-04-08 DIAGNOSIS — C9110 Chronic lymphocytic leukemia of B-cell type not having achieved remission: Secondary | ICD-10-CM | POA: Insufficient documentation

## 2023-04-08 DIAGNOSIS — K219 Gastro-esophageal reflux disease without esophagitis: Secondary | ICD-10-CM | POA: Insufficient documentation

## 2023-04-08 DIAGNOSIS — J449 Chronic obstructive pulmonary disease, unspecified: Secondary | ICD-10-CM | POA: Insufficient documentation

## 2023-04-08 DIAGNOSIS — Z7961 Long term (current) use of immunomodulator: Secondary | ICD-10-CM | POA: Insufficient documentation

## 2023-04-08 DIAGNOSIS — R61 Generalized hyperhidrosis: Secondary | ICD-10-CM | POA: Insufficient documentation

## 2023-04-08 DIAGNOSIS — Z7982 Long term (current) use of aspirin: Secondary | ICD-10-CM | POA: Insufficient documentation

## 2023-04-08 DIAGNOSIS — Z79899 Other long term (current) drug therapy: Secondary | ICD-10-CM | POA: Insufficient documentation

## 2023-04-08 DIAGNOSIS — F419 Anxiety disorder, unspecified: Secondary | ICD-10-CM | POA: Insufficient documentation

## 2023-04-08 DIAGNOSIS — F17210 Nicotine dependence, cigarettes, uncomplicated: Secondary | ICD-10-CM | POA: Insufficient documentation

## 2023-04-08 DIAGNOSIS — Z809 Family history of malignant neoplasm, unspecified: Secondary | ICD-10-CM | POA: Diagnosis not present

## 2023-04-08 DIAGNOSIS — Z9152 Personal history of nonsuicidal self-harm: Secondary | ICD-10-CM | POA: Diagnosis not present

## 2023-04-08 NOTE — Consult Note (Signed)
NEW PATIENT EVALUATION  Name: Monique [name]  MRN: [id number]  Date:   04/08/2023     DOB: 1971/05/26   This 52 y.o. female patient presents to the clinic for initial evaluation of evaluation of palliative radiation therapy to her left axilla patient with known CLL.  REFERRING PHYSICIAN: Alba Cory, MD  CHIEF COMPLAINT:  Chief Complaint  Patient presents with   CLL    Axilla    DIAGNOSIS: The encounter diagnosis was CLL (chronic lymphocytic leukemia) (HCC).   PREVIOUS INVESTIGATIONS:  CT scans reviewed PET CT scan ordered Clinical notes reviewed Pathology reports reviewed  HPI: Patient is a 52 year old female with a history since 2019 of CLL.  She has completed Rituxan plus Treanda in July 2019.  She had progressive disease then treated withImbruvica.  She was noted in 2023 to have progressive lymphadenopathy consistent with progression.  She was tried on venetoclax, although discontinued secondary to side effects.  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  She has been presented with left axillary pain and recent CT scans shows increase in mediastinal and axillary adenopathy.  She is seen today for consideration of palliation to those areas.  She is having some night sweats although that she thinks thinks is related to her menopause.  PLANNED TREATMENT REGIMEN: Palliative radiation therapy to the left axilla  PAST MEDICAL HISTORY:  has a past medical history of Anxiety, Bursitis, Carpal tunnel syndrome, Cervical dysplasia, Chronic lymphocytic leukemia (HCC) (2018), COPD (chronic obstructive pulmonary disease) (HCC), COVID-19 (2021), Depression, Eating disorder, GERD (gastroesophageal reflux disease), History of self-harm, Insomnia, Obsession, Tobacco abuse, and Vitamin B12 deficiency (non anemic).    PAST SURGICAL HISTORY:  Past Surgical History:  Procedure Laterality Date   BREAST BIOPSY Right 01/05/2018   US guided biopsy of 2 areas and 1  lymph node, MIXED INFLAMMATION AND GIANT CELL REACTION   CERVICAL BIOPSY  W/ LOOP ELECTRODE EXCISION     COLONOSCOPY WITH PROPOFOL N/A 04/21/2019   Procedure: COLONOSCOPY WITH PROPOFOL;  Surgeon: Pasty Spillers, MD;  Location: [facility];  Service: Gastroenterology;  Laterality: N/A;   OTHER SURGICAL HISTORY     scar tissue removed from vocal cords   TUBAL LIGATION     VAGINAL HYSTERECTOMY N/A 01/22/2021   Procedure: HYSTERECTOMY VAGINAL; BILATERAL SALPINGECTOMY;  Surgeon: Hildred Laser, MD;  Location: [facility];  Service: Gynecology;  Laterality: N/A;   vocal cord surgery  2005    FAMILY HISTORY: family history includes ADD / ADHD in her son; Alcohol abuse in her brother and father; Bipolar disorder in her brother; COPD in her father; Cancer in her mother; Depression in her brother and mother; Suicidality in her brother.  SOCIAL HISTORY:  reports that she has been smoking cigarettes. She started smoking about 36 years ago. She has a 36.5 pack-year smoking history. She has never used smokeless tobacco. She reports that she does not currently use alcohol. She reports current drug use. Frequency: 7.00 times per week. Drug: Marijuana.  ALLERGIES: Doxycycline  MEDICATIONS:  Current Outpatient Medications  Medication Sig Dispense Refill   albuterol (VENTOLIN HFA) 108 (90 Base) MCG/ACT inhaler Inhale 2 puffs into the lungs every 6 (six) hours as needed for wheezing or shortness of breath. 8 g 0   ARIPiprazole (ABILIFY) 5 MG tablet Take 1 tablet (5 mg total) by mouth daily. 90 tablet 1   Ascorbic Acid (VITAMIN C) 1000 MG tablet Take 1,000 mg by mouth daily as needed (  immune health).     ASPIRIN 81 PO Take 81 mg by mouth daily.     atorvastatin (LIPITOR) 10 MG tablet Take 1 tablet (10 mg total) by mouth in the morning. 90 tablet 1   Calcium Carbonate-Vit D-Min (GNP CALCIUM 1200) 1200-1000 MG-UNIT CHEW Chew 1,200 mg by mouth daily with breakfast. Take in combination with vitamin D and  magnesium. 90 tablet 3   Glycopyrrolate-Formoterol (BEVESPI AEROSPHERE) 9-4.8 MCG/ACT AERO Inhale 2 puffs into the lungs daily. 10.7 g 3   lenalidomide (REVLIMID) 10 MG capsule TAKE 1 CAPSULE (10MG  TOTAL) BY MOUTH DAILY 28 capsule 0   loratadine (CLARITIN) 10 MG tablet Take 1 tablet (10 mg total) by mouth every morning. 90 tablet 1   Magnesium 500 MG CAPS Take 500 mg by mouth in the morning.     meloxicam (MOBIC) 15 MG tablet TAKE ONE TABLET BY MOUTH EVERY MORNING 90 tablet 1   omeprazole (PRILOSEC) 20 MG capsule Take 1 capsule (20 mg total) by mouth every morning. 90 capsule 1   pregabalin (LYRICA) 100 MG capsule Take 100 mg by mouth 3 (three) times daily.     pregabalin (LYRICA) 150 MG capsule Take 150 mg by mouth 2 (two) times daily.     PREMARIN 0.625 MG tablet TAKE ONE TABLET BY MOUTH ONCE daily FOR 21 DAYS THEN DO not take FOR 7 days 90 tablet 3   tiZANidine (ZANAFLEX) 2 MG tablet Take 1 tablet (2 mg total) by mouth at bedtime. 90 tablet 1   vitamin B-12 (CYANOCOBALAMIN) 500 MCG tablet Take 250 mcg by mouth every morning.     No current facility-administered medications for this encounter.    ECOG PERFORMANCE STATUS:  1 - Symptomatic but completely ambulatory  REVIEW OF SYSTEMS: Patient denies any weight loss, fatigue, weakness, fever, chills or night sweats. Patient denies any loss of vision, blurred vision. Patient denies any ringing  of the ears or hearing loss. No irregular heartbeat. Patient denies heart murmur or history of fainting. Patient denies any chest pain or pain radiating to her upper extremities. Patient denies any shortness of breath, difficulty breathing at night, cough or hemoptysis. Patient denies any swelling in the lower legs. Patient denies any nausea vomiting, vomiting of blood, or coffee ground material in the vomitus. Patient denies any stomach pain. Patient states has had normal bowel movements no significant constipation or diarrhea. Patient denies any dysuria,  hematuria or significant nocturia. Patient denies any problems walking, swelling in the joints or loss of balance. Patient denies any skin changes, loss of hair or loss of weight. Patient denies any excessive worrying or anxiety or significant depression. Patient denies any problems with insomnia. Patient denies excessive thirst, polyuria, polydipsia. Patient denies any swollen glands, patient denies easy bruising or easy bleeding. Patient denies any recent infections, allergies or URI. Patient "s visual fields have not changed significantly in recent time.   PHYSICAL EXAM: BP 104/72   Pulse 72   Temp (!) 97.3 F (36.3 C) (Tympanic)   Resp 16   Ht 5\' 7"  (1.702 m) Comment: stated HT  Wt 186 lb 3.2 oz (84.5 kg)   LMP 08/17/2016   BMI 29.16 kg/m  Neuro no discernible peripheral adenopathy is identified.  No evidence of lymphedema in her left upper extremity is noted.  Well-developed well-nourished patient in NAD. HEENT reveals PERLA, EOMI, discs not visualized.  Oral cavity is clear. No oral mucosal lesions are identified. Neck is clear without evidence of cervical or supraclavicular  adenopathy. Lungs are clear to A&P. Cardiac examination is essentially unremarkable with regular rate and rhythm without murmur rub or thrill. Abdomen is benign with no organomegaly or masses noted. Motor sensory and DTR levels are equal and symmetric in the upper and lower extremities. Cranial nerves II through XII are grossly intact. Proprioception is intact. No peripheral adenopathy or edema is identified. No motor or sensory levels are noted. Crude visual fields are within normal range.  LABORATORY DATA: Pathology cytology report reviewed     RADIOLOGY RESULTS: CT scans reviewed PET CT scan ordered   IMPRESSION: Symptomatic CLL in the left axilla of 52 year old female  PLAN: At this time I would recommend palliative radiation therapy.  Recent studies have shown a dose of 400 cGy in 2 fractions can provide  excellent palliative benefit in greater than 90% of patients.  I have obtaining a PET CT scan to delineate areas of involvement to probably include a wider field should I see disease in the supraclavicular fossa also.  Other MEC recommendations were made after PET is performed.  Patient comprehends my recommendations well.  I would like to take this opportunity to thank you for allowing me to participate in the care of your patient.[name], MD

## 2023-04-10 ENCOUNTER — Encounter: Payer: Self-pay | Admitting: Oncology

## 2023-04-11 ENCOUNTER — Encounter: Payer: Self-pay | Admitting: Oncology

## 2023-04-11 ENCOUNTER — Other Ambulatory Visit: Payer: Self-pay | Admitting: Oncology

## 2023-04-13 ENCOUNTER — Other Ambulatory Visit: Payer: Self-pay | Admitting: Family Medicine

## 2023-04-13 DIAGNOSIS — J432 Centrilobular emphysema: Secondary | ICD-10-CM

## 2023-04-16 ENCOUNTER — Ambulatory Visit
Admission: RE | Admit: 2023-04-16 | Discharge: 2023-04-16 | Disposition: A | Discharge status: Home / Self Care | Payer: Medicare PPO | Source: Ambulatory Visit | Attending: Radiation Oncology | Admitting: Radiation Oncology

## 2023-04-16 DIAGNOSIS — C9110 Chronic lymphocytic leukemia of B-cell type not having achieved remission: Secondary | ICD-10-CM | POA: Insufficient documentation

## 2023-04-16 LAB — GLUCOSE, CAPILLARY: Glucose-Capillary: 89 mg/dL (ref 70–99)

## 2023-04-16 MED ORDER — FLUDEOXYGLUCOSE F - 18 (FDG) INJECTION
9.6000 | Freq: Once | INTRAVENOUS | Status: AC | PRN
  Administered 2023-04-16: 10.23 via INTRAVENOUS

## 2023-04-18 ENCOUNTER — Other Ambulatory Visit: Payer: Self-pay | Admitting: Family Medicine

## 2023-04-18 DIAGNOSIS — F325 Major depressive disorder, single episode, in full remission: Secondary | ICD-10-CM

## 2023-04-18 DIAGNOSIS — M7918 Myalgia, other site: Secondary | ICD-10-CM

## 2023-04-18 DIAGNOSIS — G8929 Other chronic pain: Secondary | ICD-10-CM

## 2023-04-18 DIAGNOSIS — I700 Atherosclerosis of aorta: Secondary | ICD-10-CM

## 2023-04-18 DIAGNOSIS — K219 Gastro-esophageal reflux disease without esophagitis: Secondary | ICD-10-CM

## 2023-04-18 NOTE — Progress Notes (Unsigned)
Name: Monique [name]   MRN: [id number]    DOB: [birth year]   Date:04/21/2023       Progress Note  Subjective  Chief Complaint  Follow up  HPI  CLL: diagnosed in 2017  seeing oncologist, Dr. Orlie Dakin , she is on oral Revlimid, no longer on Imbruvica she has a new rash on legs that seems secondary to medication. She has thrombocytopenia she bruises easily and has senile purpura on both arms but stable, had PET scan recently and will need to have radiation on axillary lymphadenopathy    Low back pain and radiculitis/neuropahty : she is on Lyrica, Tizanidine and Meloxicam  , she was seeing Dr. Shireen Quan  Pain is no longer constant, she is now seeing psyatrist at [facility]. Since pain is not constant advised to take Meloxicam prn only.    Emphysema: she is still smoking, currently 1.5 pack daily. . She has daily cough,  no wheezing but has intermittent sob -usually with moderate activity.  She is now taking Brezti and seems to be helping with symptoms    Atherosclerosis: she has been taking Atorvastatin and no side effects. Last LDL was at goal, getting medication on pill pack and has increased compliance    Carpal Tunnel: had NCS done by Dr. 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 Not working . Looking forward to a family gathering in [place] this month  Dysuria: described as pressure at the end of micturition, also has some vaginal discharge. No fever, chills, hematuria. We will check urinalysis, urine culture and vaginal swab   GERD: well controlled with PPI  Patient Active Problem List   Diagnosis Date Noted   Post-menopause on HRT (hormone replacement therapy) 11/08/2022   At risk for sleep apnea 09/19/2021   Lumbar foraminal stenosis (L4-5) (Bilateral) (R>L) 06/05/2021   Lumbar central spinal stenosis  (L4-5) 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 06/05/2021   Lumbosacral radiculopathy/radiculitis at L5 (Bilateral) 06/05/2021   PAD (peripheral artery disease) (HCC) 02/14/2021   CIN II (cervical intraepithelial neoplasia II) 02/01/2021   S/P vaginal hysterectomy 01/22/2021   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/09/2020   Bilateral leg pain 11/03/2019   Iron deficiency anemia 10/15/2019   Bilateral arm pain 08/05/2019   Bilateral hand numbness 08/05/2019   Weakness of both hands 08/05/2019   Chronic musculoskeletal pain 07/19/2019   Chronic neuropathic pain 07/19/2019   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   Special screening for malignant neoplasms, colon    Polyp of sigmoid colon    Hemorrhoids    Trigeminal neuralgia 10/05/2018   Lumbar radiculitis (Right) 09/24/2018   Hypocalcemia 06/03/2018   Vitamin D insufficiency 06/03/2018   Abnormal MRI, lumbar spine (06/02/2020) 06/03/2018   Chronic hip pain (Right) 06/03/2018   Spondylosis without myelopathy or radiculopathy, lumbar region 06/03/2018   DDD (degenerative disc disease), lumbar 06/02/2018   Lumbar facet hypertrophy 06/02/2018   Lumbar facet arthropathy 06/02/2018   Lumbar facet syndrome (Bilateral) (R>L) 06/02/2018   Marijuana use 05/19/2018   Chronic lower extremity pain (1ry area of Pain) (Bilateral) (R>L) 05/13/2018   Chronic low back pain (2ry area of Pain) (Bilateral) (R>L) w/ sciatica (Bilateral) 05/13/2018   Chronic pain syndrome 05/13/2018   Disorder of skeletal system 05/13/2018   Pharmacologic therapy 05/13/2018   Problems influencing health status 05/13/2018   Mastalgia  05/05/2018   Breast mass, right 01/07/2018   Face pain 12/11/2017   Tingling 12/11/2017   Thoracic aortic atherosclerosis (HCC) 08/20/2017   Emphysema of lung (HCC) 08/20/2017   Adductor tendinitis 04/23/2017   Trochanteric bursitis of right hip 04/23/2017   GERD without esophagitis 12/11/2016   CLL (chronic lymphocytic leukemia) (HCC) 11/24/2016    Stress incontinence 09/04/2016   B12 deficiency 07/10/2015   Insomnia 07/10/2015   Anorexia nervosa, restricting type 07/10/2015   Anxiety, generalized 07/10/2015   H/O suicide attempt 07/10/2015   Lymphocytosis 07/10/2015   Obsessive-compulsive disorder 07/10/2015   Tobacco use 07/10/2015   History of cervical dysplasia 07/18/2014    Past Surgical History:  Procedure Laterality Date   BREAST BIOPSY Right 01/05/2018   US guided biopsy of 2 areas and 1 lymph node, MIXED INFLAMMATION AND GIANT CELL REACTION   CERVICAL BIOPSY  W/ LOOP ELECTRODE EXCISION     COLONOSCOPY WITH PROPOFOL N/A 04/21/2019   Procedure: COLONOSCOPY WITH PROPOFOL;  Surgeon: Pasty Spillers, MD;  Location: [facility] END[facility]COPY;  Service: Gastroenterology;  Laterality: N/A;   OTHER SURGICAL HISTORY     scar tissue removed from vocal cords   TUBAL LIGATION     VAGINAL HYSTERECTOMY N/A 01/22/2021   Procedure: HYSTERECTOMY VAGINAL; BILATERAL SALPINGECTOMY;  Surgeon: Hildred Laser, MD;  Location: [facility] ORS;  Service: Gynecology;  Laterality: N/A;   vocal cord surgery  2005    Family History  Problem Relation Age of Onset   Depression Mother    Cancer Mother        thyroid   Alcohol abuse Father    COPD Father    Alcohol abuse Brother    Depression Brother    Bipolar disorder Brother    Suicidality Brother    ADD / ADHD Son    Breast cancer Neg Hx     Social History   Tobacco Use   Smoking status: Every Day    Current packs/day: 1.00    Average packs/day: 1 pack/day for 36.6 years (36.6 ttl pk-yrs)    Types: Cigarettes    Start date: 09/25/1986   Smokeless tobacco: Never  Substance Use Topics   Alcohol use: Not Currently    Alcohol/week: 0.0 standard drinks of alcohol    Comment: rarely     Current Outpatient Medications:    albuterol (VENTOLIN HFA) 108 (90 Base) MCG/ACT inhaler, Inhale 2 puffs into the lungs every 6 (six) hours as needed for wheezing or shortness of breath., Disp: 8 g, Rfl: 0    ARIPiprazole (ABILIFY) 5 MG tablet, Take 1 tablet (5 mg total) by mouth daily., Disp: 90 tablet, Rfl: 1   Ascorbic Acid (VITAMIN C) 1000 MG tablet, Take 1,000 mg by mouth daily as needed (immune health)., Disp: , Rfl:    ASPIRIN 81 PO, Take 81 mg by mouth daily., Disp: , Rfl:    atorvastatin (LIPITOR) 10 MG tablet, Take 1 tablet (10 mg total) by mouth in the morning., Disp: 90 tablet, Rfl: 1   estrogens, conjugated, (PREMARIN) 0.625 MG tablet, Take by mouth., Disp: , Rfl:    famotidine (PEPCID) 10 MG tablet, Take by mouth., Disp: , Rfl:    Glycopyrrolate-Formoterol (BEVESPI AEROSPHERE) 9-4.8 MCG/ACT AERO, Inhale 2 puffs into the lungs daily., Disp: 10.7 g, Rfl: 3   lenalidomide (REVLIMID) 10 MG capsule, TAKE 1 CAPSULE (10MG  TOTAL) BY MOUTH DAILY, Disp: 28 capsule, Rfl: 0   loratadine (CLARITIN) 10 MG tablet, Take 1 tablet (10 mg total) by mouth every morning.,  Disp: 90 tablet, Rfl: 1   Magnesium 500 MG CAPS, Take 500 mg by mouth in the morning., Disp: , Rfl:    Magnesium Oxide -Mg Supplement 500 MG CAPS, daily., Disp: , Rfl:    meloxicam (MOBIC) 15 MG tablet, TAKE ONE TABLET BY MOUTH EVERY MORNING, Disp: 90 tablet, Rfl: 1   omeprazole (PRILOSEC) 20 MG capsule, Take 1 capsule (20 mg total) by mouth every morning., Disp: 90 capsule, Rfl: 1   pregabalin (LYRICA) 100 MG capsule, Take 100 mg by mouth 3 (three) times daily., Disp: , Rfl:    PREMARIN 0.625 MG tablet, TAKE ONE TABLET BY MOUTH ONCE daily FOR 21 DAYS THEN DO not take FOR 7 days, Disp: 90 tablet, Rfl: 3   tiZANidine (ZANAFLEX) 2 MG tablet, Take 1 tablet (2 mg total) by mouth at bedtime., Disp: 90 tablet, Rfl: 1   vitamin B-12 (CYANOCOBALAMIN) 500 MCG tablet, Take 250 mcg by mouth every morning., Disp: , Rfl:    Calcium Carbonate-Vit D-Min (GNP CALCIUM 1200) 1200-1000 MG-UNIT CHEW, Chew 1,200 mg by mouth daily with breakfast. Take in combination with vitamin D and magnesium., Disp: 90 tablet, Rfl: 3  Allergies  Allergen Reactions    Doxycycline Rash    Rash on cheeks and sides of nose    Duloxetine Rash    I personally reviewed active problem list, medication list, allergies, family history with the patient/caregiver today.   ROS  Constitutional: Patient appears well-developed and well-nourished. Obese  No distress.  HEENT: head atraumatic, normocephalic, pupils equal and reactive to light, neck supple Cardiovascular: Normal rate, regular rhythm and normal heart sounds.  No murmur heard. No BLE edema. Pulmonary/Chest: Effort normal and breath sounds normal. No respiratory distress. Abdominal: Soft.  There is no tenderness. Psychiatric: Patient has a normal mood and affect. behavior is normal. Judgment and thought content normal.   Objective  Vitals:   04/21/23 0839  BP: (!) 110/54  Pulse: 75  Resp: 14  Temp: 97.9 F (36.6 C)  TempSrc: Oral  SpO2: 99%  Weight: 187 lb 1.6 oz (84.9 kg)  Height: 5\' 7"  (1.702 m)    Body mass index is 29.3 kg/m.  Physical Exam  Constitutional: Patient appears well-developed and well-nourished. No distress.  HEENT: head atraumatic, normocephalic, pupils equal and reactive to light, ears , neck supple, throat within normal limits Cardiovascular: Normal rate, regular rhythm and normal heart sounds.  No murmur heard. No BLE edema. Pulmonary/Chest: Effort normal and breath sounds normal. No respiratory distress. Abdominal: Soft.  There is no tenderness. Psychiatric: Patient has a normal mood and affect. behavior is normal. Judgment and thought content normal.   Recent Results (from the past 2160 hour(s))  Comprehensive metabolic panel     Status: Abnormal   Collection Time: 02/06/23  8:23 AM  Result Value Ref Range   Sodium 138 135 - 145 mmol/L   Potassium 4.4 3.5 - 5.1 mmol/L   Chloride 104 98 - 111 mmol/L   CO2 24 22 - 32 mmol/L   Glucose, Bld 96 70 - 99 mg/dL    Comment: Glucose reference range applies only to samples taken after fasting for at least 8 hours.   BUN  18 6 - 20 mg/dL   Creatinine, Ser 2.53 (H) 0.44 - 1.00 mg/dL   Calcium 8.9 8.9 - 66.4 mg/dL   Total Protein 6.6 6.5 - 8.1 g/dL   Albumin 4.0 3.5 - 5.0 g/dL   AST 23 15 - 41 U/L   ALT 25  0 - 44 U/L   Alkaline Phosphatase 82 38 - 126 U/L   Total Bilirubin 0.8 0.3 - 1.2 mg/dL   GFR, Estimated 57 (L) >60 mL/min    Comment: (NOTE) Calculated using the CKD-EPI Creatinine Equation (2021)    Anion gap 10 5 - 15    Comment: Performed at [facility], [address] [place], [place] [address]  CBC with Differential/Platelet     Status: Abnormal   Collection Time: 02/06/23  8:23 AM  Result Value Ref Range   WBC 3.9 (L) 4.0 - 10.5 K/uL   RBC 4.09 3.87 - 5.11 MIL/uL   Hemoglobin 14.2 12.0 - 15.0 g/dL   HCT 60.4 54.0 - 98.1 %   MCV 100.5 (H) 80.0 - 100.0 fL   MCH 34.7 (H) 26.0 - 34.0 pg   MCHC 34.5 30.0 - 36.0 g/dL   RDW 19.1 47.8 - 29.5 %   Platelets 115 (L) 150 - 400 K/uL   nRBC 0.0 0.0 - 0.2 %   Neutrophils Relative % 37 %   Neutro Abs 1.5 (L) 1.7 - 7.7 K/uL   Lymphocytes Relative 36 %   Lymphs Abs 1.4 0.7 - 4.0 K/uL   Monocytes Relative 18 %   Monocytes Absolute 0.7 0.1 - 1.0 K/uL   Eosinophils Relative 6 %   Eosinophils Absolute 0.3 0.0 - 0.5 K/uL   Basophils Relative 2 %   Basophils Absolute 0.1 0.0 - 0.1 K/uL   Immature Granulocytes 1 %   Abs Immature Granulocytes 0.03 0.00 - 0.07 K/uL    Comment: Performed at [facility], [address] [place], [place] [address]  Comprehensive metabolic panel     Status: Abnormal   Collection Time: 03/07/23  8:50 AM  Result Value Ref Range   Sodium 137 135 - 145 mmol/L   Potassium 4.2 3.5 - 5.1 mmol/L   Chloride 103 98 - 111 mmol/L   CO2 25 22 - 32 mmol/L   Glucose, Bld 88 70 - 99 mg/dL    Comment: Glucose reference range applies only to samples taken after fasting for at least 8 hours.   BUN 18 6 - 20 mg/dL   Creatinine, Ser 8.65 (H) 0.44 - 1.00 mg/dL   Calcium 8.8 (L) 8.9 - 10.3 mg/dL   Total Protein 6.4 (L) 6.5 -  8.1 g/dL   Albumin 3.9 3.5 - 5.0 g/dL   AST 19 15 - 41 U/L   ALT 20 0 - 44 U/L   Alkaline Phosphatase 67 38 - 126 U/L   Total Bilirubin 0.7 0.3 - 1.2 mg/dL   GFR, Estimated 58 (L) >60 mL/min    Comment: (NOTE) Calculated using the CKD-EPI Creatinine Equation (2021)    Anion gap 9 5 - 15    Comment: Performed at [facility], [address] [place], [place] [address]  CBC with Differential/Platelet     Status: Abnormal   Collection Time: 03/07/23  8:50 AM  Result Value Ref Range   WBC 4.0 4.0 - 10.5 K/uL   RBC 3.94 3.87 - 5.11 MIL/uL   Hemoglobin 13.6 12.0 - 15.0 g/dL   HCT 62.9 52.8 - 41.3 %   MCV 100.3 (H) 80.0 - 100.0 fL   MCH 34.5 (H) 26.0 - 34.0 pg   MCHC 34.4 30.0 - 36.0 g/dL   RDW 24.4 01.0 - 27.2 %   Platelets 122 (L) 150 - 400 K/uL   nRBC 0.0 0.0 - 0.2 %   Neutrophils  Relative % 41 %   Neutro Abs 1.7 1.7 - 7.7 K/uL   Lymphocytes Relative 36 %   Lymphs Abs 1.5 0.7 - 4.0 K/uL   Monocytes Relative 11 %   Monocytes Absolute 0.5 0.1 - 1.0 K/uL   Eosinophils Relative 9 %   Eosinophils Absolute 0.4 0.0 - 0.5 K/uL   Basophils Relative 2 %   Basophils Absolute 0.1 0.0 - 0.1 K/uL   Immature Granulocytes 1 %   Abs Immature Granulocytes 0.02 0.00 - 0.07 K/uL    Comment: Performed at [facility], [address] [place], [place] [address]  Comprehensive metabolic panel     Status: Abnormal   Collection Time: 04/04/23 10:57 AM  Result Value Ref Range   Sodium 138 135 - 145 mmol/L   Potassium 4.3 3.5 - 5.1 mmol/L   Chloride 106 98 - 111 mmol/L   CO2 24 22 - 32 mmol/L   Glucose, Bld 83 70 - 99 mg/dL    Comment: Glucose reference range applies only to samples taken after fasting for at least 8 hours.   BUN 17 6 - 20 mg/dL   Creatinine, Ser 9.62 (H) 0.44 - 1.00 mg/dL   Calcium 8.6 (L) 8.9 - 10.3 mg/dL   Total Protein 6.0 (L) 6.5 - 8.1 g/dL   Albumin 3.7 3.5 - 5.0 g/dL   AST 17 15 - 41 U/L   ALT 19 0 - 44 U/L   Alkaline Phosphatase 75 38 - 126 U/L   Total  Bilirubin 0.6 0.3 - 1.2 mg/dL   GFR, Estimated 59 (L) >60 mL/min    Comment: (NOTE) Calculated using the CKD-EPI Creatinine Equation (2021)    Anion gap 8 5 - 15    Comment: Performed at [facility], [address] [place], [place] [address]  CBC with Differential/Platelet     Status: Abnormal   Collection Time: 04/04/23 10:57 AM  Result Value Ref Range   WBC 2.9 (L) 4.0 - 10.5 K/uL   RBC 3.82 (L) 3.87 - 5.11 MIL/uL   Hemoglobin 13.4 12.0 - 15.0 g/dL   HCT 13.2 44.0 - 10.2 %   MCV 100.0 80.0 - 100.0 fL   MCH 35.1 (H) 26.0 - 34.0 pg   MCHC 35.1 30.0 - 36.0 g/dL   RDW 72.5 36.6 - 44.0 %   Platelets 98 (L) 150 - 400 K/uL    Comment: SPECIMEN CHECKED FOR CLOTS   nRBC 0.0 0.0 - 0.2 %   Neutrophils Relative % 32 %   Neutro Abs 0.9 (L) 1.7 - 7.7 K/uL   Lymphocytes Relative 48 %   Lymphs Abs 1.4 0.7 - 4.0 K/uL   Monocytes Relative 10 %   Monocytes Absolute 0.3 0.1 - 1.0 K/uL   Eosinophils Relative 7 %   Eosinophils Absolute 0.2 0.0 - 0.5 K/uL   Basophils Relative 2 %   Basophils Absolute 0.1 0.0 - 0.1 K/uL   Immature Granulocytes 1 %   Abs Immature Granulocytes 0.02 0.00 - 0.07 K/uL    Comment: Performed at [facility], [address] [place], [place] [address]  Glucose, capillary     Status: None   Collection Time: 04/16/23 12:24 PM  Result Value Ref Range   Glucose-Capillary 89 70 - 99 mg/dL    Comment: Glucose reference range applies only to samples taken after fasting for at least 8 hours.     PHQ2/9:    04/21/2023    8:42 AM 02/26/2023  9:35 AM 11/25/2022   10:58 AM 11/22/2022    1:58 PM 11/12/2022    8:24 AM  Depression screen PHQ 2/9  Decreased Interest 0 0 0 0 0  Down, Depressed, Hopeless 0 0 0 0 0  PHQ - 2 Score 0 0 0 0 0  Altered sleeping 0 0  0 0  Tired, decreased energy 0 0  0 0  Change in appetite 0 0  0 0  Feeling bad or failure about yourself  0 0  0 0  Trouble concentrating 0 0  0 0  Moving slowly or fidgety/restless 0 0  0 0  Suicidal  thoughts 0 0  0 0  PHQ-9 Score 0 0  0 0  Difficult doing work/chores    Not difficult at all     phq 9 is negative   Fall Risk:    04/21/2023    8:42 AM 02/26/2023    9:35 AM 11/25/2022   10:58 AM 11/22/2022    1:53 PM 11/12/2022    8:23 AM  Fall Risk   Falls in the past year? 0 0 0 1 0  Number falls in past yr:  0 0 0   Injury with Fall?  0 0 1   Risk for fall due to : No Fall Risks No Fall Risks  No Fall Risks No Fall Risks  Follow up Falls prevention discussed Falls prevention discussed  Education provided;Falls prevention discussed Falls prevention discussed    Functional Status Survey: Is the patient deaf or have difficulty hearing?: No Does the patient have difficulty seeing, even when wearing glasses/contacts?: No Does the patient have difficulty concentrating, remembering, or making decisions?: Yes Does the patient have difficulty walking or climbing stairs?: No Does the patient have difficulty dressing or bathing?: No Does the patient have difficulty doing errands alone such as visiting a doctor's office or shopping?: No    Assessment & Plan  1. CLL (chronic lymphocytic leukemia) (HCC)  Under the care of hematologist   2. Centrilobular emphysema (HCC)  Stable   3. Senile purpura (HCC)  Reassurance given   4. Immunocompromised state (HCC)  Taking medication for leukopenia   5. Thrombocytopenia (HCC)  Monitored  by hematologist   6. Dysuria  - Urine Culture - POCT urinalysis dipstick  7. Vaginal discharge  - Cervicovaginal ancillary only  8. GERD without esophagitis  - omeprazole (PRILOSEC) 20 MG capsule; Take 1 capsule (20 mg total) by mouth every morning.  Dispense: 90 capsule; Refill: 1  9. Major depression in remission (HCC)  - ARIPiprazole (ABILIFY) 5 MG tablet; Take 1 tablet (5 mg total) by mouth daily.  Dispense: 90 tablet; Refill: 1  10. Thoracic aortic atherosclerosis (HCC)  - atorvastatin (LIPITOR) 10 MG tablet; Take 1 tablet (10 mg  total) by mouth in the morning.  Dispense: 90 tablet; Refill: 1  11. Chronic low back pain (Secondary Area of Pain) (Bilateral) (R>L) w/ sciatica (Bilateral)  - meloxicam (MOBIC) 15 MG tablet; Take 1 tablet (15 mg total) by mouth every morning.  Dispense: 90 tablet; Refill: 1 - tiZANidine (ZANAFLEX) 2 MG tablet; Take 1 tablet (2 mg total) by mouth at bedtime.  Dispense: 90 tablet; Refill: 1

## 2023-04-20 ENCOUNTER — Other Ambulatory Visit: Payer: Self-pay | Admitting: Oncology

## 2023-04-20 DIAGNOSIS — C9110 Chronic lymphocytic leukemia of B-cell type not having achieved remission: Secondary | ICD-10-CM

## 2023-04-21 ENCOUNTER — Encounter: Payer: Self-pay | Admitting: Family Medicine

## 2023-04-21 ENCOUNTER — Other Ambulatory Visit (HOSPITAL_COMMUNITY)
Admission: RE | Admit: 2023-04-21 | Discharge: 2023-04-21 | Disposition: A | Discharge status: Home / Self Care | Payer: Medicare PPO | Source: Ambulatory Visit | Attending: Family Medicine | Admitting: Family Medicine

## 2023-04-21 ENCOUNTER — Ambulatory Visit (INDEPENDENT_AMBULATORY_CARE_PROVIDER_SITE_OTHER): Payer: Medicare PPO | Admitting: Family Medicine

## 2023-04-21 VITALS — BP 110/54 | HR 75 | Temp 97.9°F | Resp 14 | Ht 67.0 in | Wt 187.1 lb

## 2023-04-21 DIAGNOSIS — I700 Atherosclerosis of aorta: Secondary | ICD-10-CM

## 2023-04-21 DIAGNOSIS — N898 Other specified noninflammatory disorders of vagina: Secondary | ICD-10-CM | POA: Insufficient documentation

## 2023-04-21 DIAGNOSIS — C9110 Chronic lymphocytic leukemia of B-cell type not having achieved remission: Secondary | ICD-10-CM | POA: Diagnosis not present

## 2023-04-21 DIAGNOSIS — M5441 Lumbago with sciatica, right side: Secondary | ICD-10-CM

## 2023-04-21 DIAGNOSIS — R300 Dysuria: Secondary | ICD-10-CM

## 2023-04-21 DIAGNOSIS — D849 Immunodeficiency, unspecified: Secondary | ICD-10-CM | POA: Diagnosis not present

## 2023-04-21 DIAGNOSIS — J432 Centrilobular emphysema: Secondary | ICD-10-CM

## 2023-04-21 DIAGNOSIS — D692 Other nonthrombocytopenic purpura: Secondary | ICD-10-CM | POA: Diagnosis not present

## 2023-04-21 DIAGNOSIS — D696 Thrombocytopenia, unspecified: Secondary | ICD-10-CM

## 2023-04-21 DIAGNOSIS — G8929 Other chronic pain: Secondary | ICD-10-CM

## 2023-04-21 DIAGNOSIS — M5442 Lumbago with sciatica, left side: Secondary | ICD-10-CM

## 2023-04-21 DIAGNOSIS — F325 Major depressive disorder, single episode, in full remission: Secondary | ICD-10-CM

## 2023-04-21 DIAGNOSIS — K219 Gastro-esophageal reflux disease without esophagitis: Secondary | ICD-10-CM

## 2023-04-21 LAB — POCT URINALYSIS DIPSTICK
Appearance: ABNORMAL
Bilirubin, UA: NEGATIVE
Glucose, UA: NEGATIVE
Nitrite, UA: NEGATIVE
Protein, UA: NEGATIVE
Spec Grav, UA: 1.01 (ref 1.010–1.025)
Urobilinogen, UA: 0.2 E.U./dL
pH, UA: 5 (ref 5.0–8.0)

## 2023-04-21 MED ORDER — MELOXICAM 15 MG PO TABS
15.0000 mg | ORAL_TABLET | Freq: Every morning | ORAL | 1 refills | Status: DC
Start: 2023-04-21 — End: 2023-04-21

## 2023-04-21 MED ORDER — ARIPIPRAZOLE 5 MG PO TABS: 5.0000 mg | ORAL_TABLET | Freq: Every day | ORAL | 1 refills | Status: DC

## 2023-04-21 MED ORDER — TIZANIDINE HCL 2 MG PO TABS
2.0000 mg | ORAL_TABLET | Freq: Every evening | ORAL | 1 refills | Status: DC
Start: 2023-04-21 — End: 2023-05-12

## 2023-04-21 MED ORDER — MELOXICAM 15 MG PO TABS
15.0000 mg | ORAL_TABLET | Freq: Every day | ORAL | 1 refills | Status: DC | PRN
Start: 2023-04-21 — End: 2023-07-15

## 2023-04-21 MED ORDER — ATORVASTATIN CALCIUM 10 MG PO TABS: 10.0000 mg | ORAL_TABLET | Freq: Every morning | ORAL | 1 refills | Status: DC

## 2023-04-21 MED ORDER — OMEPRAZOLE 20 MG PO CPDR
20.0000 mg | DELAYED_RELEASE_CAPSULE | Freq: Every morning | ORAL | 1 refills | Status: DC
Start: 2023-04-21 — End: 2023-05-12

## 2023-04-23 ENCOUNTER — Other Ambulatory Visit: Payer: Self-pay | Admitting: Family Medicine

## 2023-04-23 MED ORDER — METRONIDAZOLE 500 MG PO TABS
500.0000 mg | ORAL_TABLET | Freq: Two times a day (BID) | ORAL | 0 refills | Status: DC
Start: 2023-04-23 — End: 2023-05-12

## 2023-04-23 MED ORDER — NITROFURANTOIN MONOHYD MACRO 100 MG PO CAPS: 100.0000 mg | ORAL_CAPSULE | Freq: Two times a day (BID) | ORAL | 0 refills | Status: DC

## 2023-04-28 ENCOUNTER — Ambulatory Visit: Payer: Medicare PPO | Admitting: Radiation Oncology

## 2023-05-01 ENCOUNTER — Other Ambulatory Visit (HOSPITAL_COMMUNITY): Payer: Self-pay

## 2023-05-02 ENCOUNTER — Other Ambulatory Visit: Payer: Medicare PPO

## 2023-05-02 ENCOUNTER — Ambulatory Visit: Payer: Medicare PPO | Admitting: Oncology

## 2023-05-02 ENCOUNTER — Other Ambulatory Visit (HOSPITAL_COMMUNITY): Payer: Self-pay

## 2023-05-02 ENCOUNTER — Ambulatory Visit: Payer: Medicare PPO

## 2023-05-02 MED ORDER — CLOBETASOL PROPIONATE 0.05 % EX CREA: TOPICAL_CREAM | CUTANEOUS | 1 refills | Status: DC

## 2023-05-02 MED ORDER — UMECLIDINIUM-VILANTEROL 62.5-25 MCG/ACT IN AEPB
1.0000 | INHALATION_SPRAY | Freq: Every day | RESPIRATORY_TRACT | 5 refills | Status: DC
  Filled 2023-05-21: qty 60, 60d supply, fill #0
  Filled 2023-08-04: qty 60, 30d supply, fill #0

## 2023-05-03 ENCOUNTER — Other Ambulatory Visit (HOSPITAL_COMMUNITY): Payer: Self-pay

## 2023-05-03 MED ORDER — ARIPIPRAZOLE 5 MG PO TABS
5.0000 mg | ORAL_TABLET | Freq: Every day | ORAL | 1 refills | Status: DC
  Filled 2023-05-03: qty 90, 90d supply, fill #0
  Filled 2023-05-21: qty 30, 30d supply, fill #0
  Filled 2023-06-17: qty 30, 30d supply, fill #1
  Filled 2023-07-14: qty 30, 30d supply, fill #2
  Filled 2023-08-08 (×2): qty 30, 30d supply, fill #3
  Filled 2023-09-02 – 2023-09-04 (×3): qty 30, 30d supply, fill #4
  Filled 2023-09-22: qty 30, 30d supply, fill #5
  Filled ????-??-??: fill #5

## 2023-05-03 MED ORDER — MELOXICAM 15 MG PO TABS
15.0000 mg | ORAL_TABLET | Freq: Every morning | ORAL | 0 refills | Status: DC
  Filled 2023-05-03: qty 90, 90d supply, fill #0
  Filled 2023-05-21: qty 30, 30d supply, fill #0
  Filled 2023-06-17: qty 30, 30d supply, fill #1

## 2023-05-03 MED ORDER — OMEPRAZOLE 20 MG PO CPDR
20.0000 mg | DELAYED_RELEASE_CAPSULE | Freq: Every day | ORAL | 0 refills | Status: DC
  Filled 2023-05-03: qty 90, 90d supply, fill #0
  Filled 2023-05-21: qty 30, 30d supply, fill #0
  Filled 2023-06-17: qty 30, 30d supply, fill #1
  Filled 2023-07-14: qty 30, 30d supply, fill #2
  Filled 2023-08-08 (×2): qty 30, 30d supply, fill #3
  Filled 2023-09-02 – 2023-09-04 (×3): qty 30, 30d supply, fill #4

## 2023-05-03 MED ORDER — ESTROGENS CONJUGATED 0.625 MG PO TABS
0.6250 mg | ORAL_TABLET | Freq: Every day | ORAL | 2 refills | Status: DC
  Filled 2023-05-03: qty 90, 90d supply, fill #0

## 2023-05-03 MED ORDER — PREGABALIN 150 MG PO CAPS
150.0000 mg | ORAL_CAPSULE | Freq: Three times a day (TID) | ORAL | 3 refills | Status: DC
  Filled 2023-05-03: qty 90, 30d supply, fill #0

## 2023-05-03 MED ORDER — LORATADINE 10 MG PO TABS
10.0000 mg | ORAL_TABLET | Freq: Every day | ORAL | 1 refills | Status: DC
  Filled 2023-05-03: qty 90, 90d supply, fill #0
  Filled 2023-05-21: qty 30, 30d supply, fill #0
  Filled 2023-06-14 – 2023-06-18 (×4): qty 30, 30d supply, fill #1
  Filled 2023-07-14: qty 30, 30d supply, fill #2
  Filled 2023-08-08 (×2): qty 30, 30d supply, fill #3
  Filled 2023-09-02 – 2023-09-04 (×3): qty 30, 30d supply, fill #4
  Filled 2023-09-22 – 2023-10-02 (×2): qty 30, 30d supply, fill #5
  Filled ????-??-??: fill #5

## 2023-05-03 MED ORDER — BEVESPI AEROSPHERE 9-4.8 MCG/ACT IN AERO
INHALATION_SPRAY | RESPIRATORY_TRACT | 1 refills | Status: DC
  Filled 2023-05-03 – 2023-05-21 (×2): qty 10.7, 30d supply, fill #0
  Filled 2023-06-17: qty 10.7, 30d supply, fill #1

## 2023-05-03 MED ORDER — TIZANIDINE HCL 2 MG PO TABS
2.0000 mg | ORAL_TABLET | Freq: Every day | ORAL | 1 refills | Status: DC
  Filled 2023-05-03: qty 90, 90d supply, fill #0
  Filled 2023-05-21: qty 30, 30d supply, fill #0
  Filled 2023-06-17: qty 30, 30d supply, fill #1
  Filled 2023-07-14: qty 30, 30d supply, fill #2
  Filled 2023-08-08 (×2): qty 30, 30d supply, fill #3
  Filled 2023-09-02 – 2023-09-04 (×3): qty 30, 30d supply, fill #4
  Filled 2023-09-22: qty 30, 30d supply, fill #5
  Filled ????-??-??: fill #5

## 2023-05-03 MED ORDER — MAGNESIUM 500 MG PO CAPS
500.0000 mg | ORAL_CAPSULE | Freq: Every day | ORAL | 99 refills | Status: DC
Start: 2023-03-19 — End: 2023-05-26
  Filled 2023-05-03 – 2023-05-26 (×5): qty 30, 30d supply, fill #0

## 2023-05-03 MED ORDER — ATORVASTATIN CALCIUM 10 MG PO TABS
10.0000 mg | ORAL_TABLET | Freq: Every day | ORAL | 1 refills | Status: DC
  Filled 2023-05-03: qty 90, 90d supply, fill #0
  Filled 2023-05-21: qty 30, 30d supply, fill #0
  Filled 2023-06-12 – 2023-06-17 (×2): qty 30, 30d supply, fill #1

## 2023-05-05 ENCOUNTER — Other Ambulatory Visit: Payer: Self-pay

## 2023-05-05 ENCOUNTER — Other Ambulatory Visit (HOSPITAL_COMMUNITY): Payer: Self-pay

## 2023-05-06 ENCOUNTER — Other Ambulatory Visit: Payer: Self-pay

## 2023-05-06 ENCOUNTER — Other Ambulatory Visit (HOSPITAL_COMMUNITY): Payer: Self-pay

## 2023-05-06 MED FILL — Dexamethasone Sodium Phosphate Inj 100 MG/10ML: INTRAMUSCULAR | Qty: 2 | Status: AC

## 2023-05-07 ENCOUNTER — Inpatient Hospital Stay (HOSPITAL_BASED_OUTPATIENT_CLINIC_OR_DEPARTMENT_OTHER): Payer: Medicare PPO | Admitting: Oncology

## 2023-05-07 ENCOUNTER — Inpatient Hospital Stay: Payer: Medicare PPO | Attending: Oncology

## 2023-05-07 ENCOUNTER — Inpatient Hospital Stay: Payer: Medicare PPO

## 2023-05-07 ENCOUNTER — Other Ambulatory Visit: Payer: Medicare PPO

## 2023-05-07 ENCOUNTER — Ambulatory Visit: Payer: Medicare PPO | Admitting: Oncology

## 2023-05-07 ENCOUNTER — Other Ambulatory Visit (HOSPITAL_COMMUNITY): Payer: Self-pay

## 2023-05-07 ENCOUNTER — Ambulatory Visit: Payer: Medicare PPO

## 2023-05-07 VITALS — BP 112/60 | HR 52 | Resp 18

## 2023-05-07 DIAGNOSIS — Z5112 Encounter for antineoplastic immunotherapy: Secondary | ICD-10-CM | POA: Insufficient documentation

## 2023-05-07 DIAGNOSIS — Z79899 Other long term (current) drug therapy: Secondary | ICD-10-CM | POA: Diagnosis not present

## 2023-05-07 DIAGNOSIS — C9110 Chronic lymphocytic leukemia of B-cell type not having achieved remission: Secondary | ICD-10-CM | POA: Insufficient documentation

## 2023-05-07 LAB — CBC WITH DIFFERENTIAL/PLATELET
Abs Immature Granulocytes: 0.02 10*3/uL (ref 0.00–0.07)
Basophils Absolute: 0.1 10*3/uL (ref 0.0–0.1)
Basophils Relative: 2 %
Eosinophils Absolute: 0.3 10*3/uL (ref 0.0–0.5)
Eosinophils Relative: 10 %
HCT: 37.2 % (ref 36.0–46.0)
Hemoglobin: 12.5 g/dL (ref 12.0–15.0)
Immature Granulocytes: 1 %
Lymphocytes Relative: 41 %
Lymphs Abs: 1.1 10*3/uL (ref 0.7–4.0)
MCH: 34.9 pg — ABNORMAL HIGH (ref 26.0–34.0)
MCHC: 33.6 g/dL (ref 30.0–36.0)
MCV: 103.9 fL — ABNORMAL HIGH (ref 80.0–100.0)
Monocytes Absolute: 0.2 10*3/uL (ref 0.1–1.0)
Monocytes Relative: 9 %
Neutro Abs: 1 10*3/uL — ABNORMAL LOW (ref 1.7–7.7)
Neutrophils Relative %: 37 %
Platelets: 85 10*3/uL — ABNORMAL LOW (ref 150–400)
RBC: 3.58 MIL/uL — ABNORMAL LOW (ref 3.87–5.11)
RDW: 13.7 % (ref 11.5–15.5)
WBC: 2.6 10*3/uL — ABNORMAL LOW (ref 4.0–10.5)
nRBC: 0 % (ref 0.0–0.2)

## 2023-05-07 LAB — COMPREHENSIVE METABOLIC PANEL
ALT: 23 U/L (ref 0–44)
AST: 22 U/L (ref 15–41)
Albumin: 3.6 g/dL (ref 3.5–5.0)
Alkaline Phosphatase: 70 U/L (ref 38–126)
Anion gap: 7 (ref 5–15)
BUN: 18 mg/dL (ref 6–20)
CO2: 23 mmol/L (ref 22–32)
Calcium: 8.4 mg/dL — ABNORMAL LOW (ref 8.9–10.3)
Chloride: 108 mmol/L (ref 98–111)
Creatinine, Ser: 1.26 mg/dL — ABNORMAL HIGH (ref 0.44–1.00)
GFR, Estimated: 51 mL/min — ABNORMAL LOW (ref >60)
Glucose, Bld: 100 mg/dL — ABNORMAL HIGH (ref 70–99)
Potassium: 4.1 mmol/L (ref 3.5–5.1)
Sodium: 138 mmol/L (ref 135–145)
Total Bilirubin: 0.6 mg/dL (ref 0.3–1.2)
Total Protein: 5.8 g/dL — ABNORMAL LOW (ref 6.5–8.1)

## 2023-05-07 MED ORDER — FAMOTIDINE IN NACL 20-0.9 MG/50ML-% IV SOLN
20.0000 mg | Freq: Once | INTRAVENOUS | Status: AC
  Administered 2023-05-07: 20 mg via INTRAVENOUS
  Filled 2023-05-07: qty 50

## 2023-05-07 MED ORDER — DIPHENHYDRAMINE HCL 25 MG PO CAPS
50.0000 mg | ORAL_CAPSULE | Freq: Once | ORAL | Status: AC
  Administered 2023-05-07: 50 mg via ORAL
  Filled 2023-05-07: qty 2

## 2023-05-07 MED ORDER — SODIUM CHLORIDE 0.9 % IV SOLN
Freq: Once | INTRAVENOUS | Status: AC
  Filled 2023-05-07: qty 250

## 2023-05-07 MED ORDER — SODIUM CHLORIDE 0.9 % IV SOLN
375.0000 mg/m2 | Freq: Once | INTRAVENOUS | Status: AC
  Administered 2023-05-07: 700 mg via INTRAVENOUS
  Filled 2023-05-07: qty 50

## 2023-05-07 MED ORDER — ACETAMINOPHEN 325 MG PO TABS
650.0000 mg | ORAL_TABLET | Freq: Once | ORAL | Status: AC
  Administered 2023-05-07: 650 mg via ORAL
  Filled 2023-05-07: qty 2

## 2023-05-07 MED ORDER — SODIUM CHLORIDE 0.9 % IV SOLN
20.0000 mg | Freq: Once | INTRAVENOUS | Status: AC
  Administered 2023-05-07: 20 mg via INTRAVENOUS
  Filled 2023-05-07: qty 20

## 2023-05-07 NOTE — Progress Notes (Signed)
Patient says that she is doing fine, no new questions or concerns for Dr. Irving Copas this morning.

## 2023-05-07 NOTE — Patient Instructions (Addendum)
[facility]  Discharge Instructions: Thank you for choosing [facility] to provide your oncology and hematology care.  If you have a lab appointment with the [facility], please go directly to the [facility] and check in at the registration area.  Wear comfortable clothing and clothing appropriate for easy access to any Portacath or PICC line.   We strive to give you quality time with your provider. You may need to reschedule your appointment if you arrive late (15 or more minutes).  Arriving late affects you and other patients whose appointments are after yours.  Also, if you miss three or more appointments without notifying the office, you may be dismissed from the clinic at the provider's discretion.      For prescription refill requests, have your pharmacy contact our office and allow 72 hours for refills to be completed.    Today you received the following chemotherapy and/or immunotherapy agents Ruxience      To help prevent nausea and vomiting after your treatment, we encourage you to take your nausea medication as directed.  BELOW ARE SYMPTOMS THAT SHOULD BE REPORTED IMMEDIATELY: *FEVER GREATER THAN 100.4 F (38 C) OR HIGHER *CHILLS OR SWEATING *NAUSEA AND VOMITING THAT IS NOT CONTROLLED WITH YOUR NAUSEA MEDICATION *UNUSUAL SHORTNESS OF BREATH *UNUSUAL BRUISING OR BLEEDING *URINARY PROBLEMS (pain or burning when urinating, or frequent urination) *BOWEL PROBLEMS (unusual diarrhea, constipation, pain near the anus) TENDERNESS IN MOUTH AND THROAT WITH OR WITHOUT PRESENCE OF ULCERS (sore throat, sores in mouth, or a toothache) UNUSUAL RASH, SWELLING OR PAIN  UNUSUAL VAGINAL DISCHARGE OR ITCHING   Items with * indicate a potential emergency and should be followed up as soon as possible or go to the Emergency Department if any problems should occur.  Please show the CHEMOTHERAPY ALERT CARD or IMMUNOTHERAPY ALERT CARD at check-in to  the Emergency Department and triage nurse.  Should you have questions after your visit or need to cancel or reschedule your appointment, please contact [facility]  [phone] and follow the prompts.  Office hours are 8:00 a.m. to 4:30 p.m. Monday - Friday. Please note that voicemails left after 4:00 p.m. may not be returned until the following business day.  We are closed weekends and major holidays. You have access to a nurse at all times for urgent questions. Please call the main number to the clinic [phone] and follow the prompts.  For any non-urgent questions, you may also contact your provider using MyChart. We now offer e-Visits for anyone 63 and older to request care online for non-urgent symptoms. For details visit [email or web address].   Also download the MyChart app! Go to the app store, search "MyChart", open the app, select [facility], and log in with your MyChart username and password.

## 2023-05-07 NOTE — Progress Notes (Signed)
[facility]  [phone]) [phone] Fax:([phone]) [phone]  ID: Aldona Bar OB: 1971/09/10  MR#: [id number]  MWN#:[id number]  Patient Care Team: Alba Cory, MD as PCP - General (Family Medicine) Jeralyn Ruths, MD as Consulting Physician (Oncology) Zettie Pho, [facility] (Inactive) as Pharmacist (Pharmacist) Gustavus Bryant, LCSW as [facility] Care Management (Licensed Clinical Social Worker) Keitha Butte, RN as Registered Nurse (Oncology) Borders, Daryl Eastern, NP as Nurse Practitioner (Hospice and Palliative Medicine)  CHIEF COMPLAINT: CLL.  INTERVAL HISTORY: Patient returns to clinic today for further evaluation and continuation of Rituxan plus Revlimid.  She continues to have tenderness in her left axilla and is initiating XRT next week.  She otherwise feels well.  She does not complain of any weakness or fatigue today. She continues to have a chronic peripheral neuropathy. She has no other neurologic complaints.  She has a good appetite.  She has no chest pain, shortness of breath, cough, or hemoptysis.  She denies any nausea, vomiting, or constipation.  She has no melena or hematochezia.  She has no urinary complaints.  Patient offers no further specific complaints today.  REVIEW OF SYSTEMS:   Review of Systems  Constitutional: Negative.  Negative for diaphoresis, fever, malaise/fatigue and weight loss.  HENT:  Negative for congestion.   Respiratory: Negative.  Negative for cough and shortness of breath.   Cardiovascular: Negative.  Negative for chest pain and leg swelling.  Gastrointestinal: Negative.  Negative for abdominal pain, constipation and diarrhea.  Genitourinary: Negative.  Negative for dysuria and frequency.  Musculoskeletal: Negative.  Negative for back pain, myalgias and neck pain.  Skin: Negative.  Negative for rash.  Neurological:  Positive for tingling and sensory change. Negative for focal weakness and weakness.   Psychiatric/Behavioral: Negative.  Negative for depression and suicidal ideas. The patient is not nervous/anxious.     As per HPI. Otherwise, a complete review of systems is negative.  PAST MEDICAL HISTORY: Past Medical History:  Diagnosis Date   Anxiety    Bursitis    leg pain   Carpal tunnel syndrome    Cervical dysplasia    hx LEEP over 18 years ago.    Chronic lymphocytic leukemia (HCC) 2018   Dr Orlie Dakin.  (in lymph nodes)   COPD (chronic obstructive pulmonary disease) (HCC)    COVID-19 2021   Depression    Eating disorder    GERD (gastroesophageal reflux disease)    History of self-harm    Insomnia    Obsession    Tobacco abuse    Vitamin B12 deficiency (non anemic)     PAST SURGICAL HISTORY: Past Surgical History:  Procedure Laterality Date   BREAST BIOPSY Right 01/05/2018   US guided biopsy of 2 areas and 1 lymph node, MIXED INFLAMMATION AND GIANT CELL REACTION   CERVICAL BIOPSY  W/ LOOP ELECTRODE EXCISION     COLONOSCOPY WITH PROPOFOL N/A 04/21/2019   Procedure: COLONOSCOPY WITH PROPOFOL;  Surgeon: Pasty Spillers, MD;  Location: [facility] ENDOSCOPY;  Service: Gastroenterology;  Laterality: N/A;   OTHER SURGICAL HISTORY     scar tissue removed from vocal cords   TUBAL LIGATION     VAGINAL HYSTERECTOMY N/A 01/22/2021   Procedure: HYSTERECTOMY VAGINAL; BILATERAL SALPINGECTOMY;  Surgeon: Hildred Laser, MD;  Location: [facility] ORS;  Service: Gynecology;  Laterality: N/A;   vocal cord surgery  2005    FAMILY HISTORY: Family History  Problem Relation Age of Onset   Depression Mother    Cancer  Mother        thyroid   Alcohol abuse Father    COPD Father    Alcohol abuse Brother    Depression Brother    Bipolar disorder Brother    Suicidality Brother    ADD / ADHD Son    Breast cancer Neg Hx     ADVANCED DIRECTIVES (Y/N):  N  HEALTH MAINTENANCE: Social History   Tobacco Use   Smoking status: Every Day    Current packs/day: 1.00    Average packs/day: 1  pack/day for 36.6 years (36.6 ttl pk-yrs)    Types: Cigarettes    Start date: 09/25/1986   Smokeless tobacco: Never  Vaping Use   Vaping status: Former  Substance Use Topics   Alcohol use: Not Currently    Alcohol/week: 0.0 standard drinks of alcohol    Comment: rarely   Drug use: Yes    Frequency: 7.0 times per week    Types: Marijuana     Colonoscopy:  PAP:  Bone density:  Lipid panel:  Allergies  Allergen Reactions   Doxycycline Rash    Rash on cheeks and sides of nose    Duloxetine Rash    Current Outpatient Medications  Medication Sig Dispense Refill   albuterol (VENTOLIN HFA) 108 (90 Base) MCG/ACT inhaler Inhale 2 puffs into the lungs every 6 (six) hours as needed for wheezing or shortness of breath. 8 g 0   ARIPiprazole (ABILIFY) 5 MG tablet Take 1 tablet (5 mg total) by mouth daily. 90 tablet 1   ARIPiprazole (ABILIFY) 5 MG tablet Take 1 tablet (5 mg total) by mouth daily. 90 tablet 1   Ascorbic Acid (VITAMIN C) 1000 MG tablet Take 1,000 mg by mouth daily as needed (immune health).     ASPIRIN 81 PO Take 81 mg by mouth daily.     atorvastatin (LIPITOR) 10 MG tablet Take 1 tablet (10 mg total) by mouth in the morning. 90 tablet 1   atorvastatin (LIPITOR) 10 MG tablet Take 1 tablet (10 mg total) by mouth at bedtime. 90 tablet 1   clobetasol cream (TEMOVATE) 0.05 % Apply topically to bites on lower legs as directed daily up to 5 days a week until clear then use as needed for flares 45 g 1   estrogens, conjugated, (PREMARIN) 0.625 MG tablet Take by mouth.     estrogens, conjugated, (PREMARIN) 0.625 MG tablet Take 1 tablet (0.625 mg total) by mouth daily for 21 days, then do not take for 7 days. 90 tablet 2   famotidine (PEPCID) 10 MG tablet Take by mouth.     Glycopyrrolate-Formoterol (BEVESPI AEROSPHERE) 9-4.8 MCG/ACT AERO Inhale 2 puffs into the lungs daily. 10.7 g 3   Glycopyrrolate-Formoterol (BEVESPI AEROSPHERE) 9-4.8 MCG/ACT AERO Inhale 2 puffs by mouth into the  lungs once daily. 10.7 g 1   lenalidomide (REVLIMID) 10 MG capsule TAKE 1 CAPSULE (10MG  TOTAL) BY MOUTH DAILY 28 capsule 0   loratadine (CLARITIN) 10 MG tablet Take 1 tablet (10 mg total) by mouth every morning. 90 tablet 1   loratadine (CLARITIN) 10 MG tablet Take 1 tablet (10 mg total) by mouth daily in the morning. 90 tablet 1   Magnesium 500 MG CAPS Take 500 mg by mouth in the morning.     Magnesium 500 MG CAPS Take 1 capsule (500 mg total) by mouth daily. 30 capsule 99   meloxicam (MOBIC) 15 MG tablet Take 1 tablet (15 mg total) by mouth daily as needed for pain.  90 tablet 1   meloxicam (MOBIC) 15 MG tablet Take 1 tablet (15 mg total) by mouth every morning. 150 tablet 0   metroNIDAZOLE (FLAGYL) 500 MG tablet Take 1 tablet (500 mg total) by mouth 2 (two) times daily. 14 tablet 0   nitrofurantoin, macrocrystal-monohydrate, (MACROBID) 100 MG capsule Take 1 capsule (100 mg total) by mouth 2 (two) times daily. 10 capsule 0   omeprazole (PRILOSEC) 20 MG capsule Take 1 capsule (20 mg total) by mouth every morning. 90 capsule 1   omeprazole (PRILOSEC) 20 MG capsule Take 1 capsule (20 mg total) by mouth daily in the morning. 150 capsule 0   pregabalin (LYRICA) 100 MG capsule Take 100 mg by mouth 3 (three) times daily.     pregabalin (LYRICA) 150 MG capsule Take 1 capsule (150 mg total) by mouth in the morning, at noon, and at bedtime. 90 capsule 3   PREMARIN 0.625 MG tablet TAKE ONE TABLET BY MOUTH ONCE daily FOR 21 DAYS THEN DO not take FOR 7 days 90 tablet 3   tiZANidine (ZANAFLEX) 2 MG tablet Take 1 tablet (2 mg total) by mouth at bedtime. 90 tablet 1   tiZANidine (ZANAFLEX) 2 MG tablet Take 1 tablet (2 mg total) by mouth at bedtime. 90 tablet 1   umeclidinium-vilanterol (ANORO ELLIPTA) 62.5-25 MCG/ACT AEPB Inhale 1 puff into the lungs daily. 60 each 5   vitamin B-12 (CYANOCOBALAMIN) 500 MCG tablet Take 250 mcg by mouth every morning.     Calcium Carbonate-Vit D-Min (GNP CALCIUM 1200) 1200-1000  MG-UNIT CHEW Chew 1,200 mg by mouth daily with breakfast. Take in combination with vitamin D and magnesium. 90 tablet 3   No current facility-administered medications for this visit.    OBJECTIVE: Vitals:   05/07/23 0906  BP: (!) 87/55  Pulse: 70  Temp: 97.9 F (36.6 C)  SpO2: 99%      Body mass index is 30.1 kg/m.    ECOG FS:0 - Asymptomatic  General: Well-developed, well-nourished, no acute distress. Eyes: Pink conjunctiva, anicteric sclera. HEENT: Normocephalic, moist mucous membranes. Lungs: No audible wheezing or coughing. Heart: Regular rate and rhythm. Abdomen: Soft, nontender, no obvious distention. Musculoskeletal: No edema, cyanosis, or clubbing. Neuro: Alert, answering all questions appropriately. Cranial nerves grossly intact. Skin: No rashes or petechiae noted. Psych: Normal affect.  LAB RESULTS:  Lab Results  Component Value Date   NA 138 04/04/2023   K 4.3 04/04/2023   CL 106 04/04/2023   CO2 24 04/04/2023   GLUCOSE 83 04/04/2023   BUN 17 04/04/2023   CREATININE 1.12 (H) 04/04/2023   CALCIUM 8.6 (L) 04/04/2023   PROT 6.0 (L) 04/04/2023   ALBUMIN 3.7 04/04/2023   AST 17 04/04/2023   ALT 19 04/04/2023   ALKPHOS 75 04/04/2023   BILITOT 0.6 04/04/2023   GFRNONAA 59 (L) 04/04/2023   GFRAA 79 12/06/2020    Lab Results  Component Value Date   WBC 2.6 (L) 05/07/2023   NEUTROABS 1.0 (L) 05/07/2023   HGB 12.5 05/07/2023   HCT 37.2 05/07/2023   MCV 103.9 (H) 05/07/2023   PLT 85 (L) 05/07/2023     STUDIES: NM PET Image Initial (PI) Skull Base To Thigh  Result Date: 04/22/2023 CLINICAL DATA:  Subsequent treatment strategy for chronic lymphocytic leukemia. EXAM: NUCLEAR MEDICINE PET SKULL BASE TO THIGH TECHNIQUE: 10.2 mCi F-18 FDG was injected intravenously. Full-ring PET imaging was performed from the skull base to thigh after the radiotracer. CT data was obtained and used for attenuation  correction and anatomic localization. Fasting blood glucose: 89  mg/dl COMPARISON:  CT neck, chest, abdomen, and pelvis dated 03/21/2023 and 12/09/2022. FINDINGS: Mediastinal blood pool activity: SUV max 2.6 Liver activity: SUV max NA NECK: Small bilateral cervical nodes, including a 8 mm short axis left level 2 node (series 4/image 17), max SUV 2.6. These are grossly unchanged when compared to March 2024. Incidental CT findings: None. CHEST: Bilateral axillary and subpectoral nodes, including a 13 mm short axis node on the right (series 4/image 44) with max SUV 2.3 and a 17 mm short axis node on the left (series 4/image 39) with max SUV 2.6. These are mildly progressive when compared to March 2024. No hypermetabolic pulmonary nodules. Incidental CT findings: Mild atherosclerotic calcifications of the aortic arch. Mild coronary atherosclerosis of the LAD. ABDOMEN/PELVIS: No abnormal hypermetabolism in the liver, pancreas, or adrenal glands. Spleen is normal in size, without focal lesion. Retroperitoneal and bilateral pelvic lymphadenopathy, including: --2.6 cm short axis left para-aortic node (series 4/image 110), max SUV 4.8 --1.6 cm short axis right external iliac node (series 4/image 141), max SUV 3.2 --1.9 cm short axis left external iliac node (series 4/image 142), max SUV 3.2 These are mildly progressive when compared to March 2024. Incidental CT findings: Mild rectal stool burden. Vascular calcifications. Status post hysterectomy. SKELETON: No focal hypermetabolic activity to suggest skeletal metastasis. Incidental CT findings: None. IMPRESSION: Mildly progressive lymphadenopathy above and below the diaphragm when compared to March 2024, as above. Spleen is normal in size, without focal lesion. Electronically Signed   By: Charline Bills M.D.   On: 04/22/2023 23:17    ONCOLOGY HISTORY:  Patient completed cycle 3 of Rituxan plus Treanda on March 26, 2018, but was then noted to have progressive disease.  She was then initiated on 480 mg Imbruvica in November 2019, but had  progression of disease with increasing white blood cell count as well as CT scan on Jan 22, 2022 revealing increased lymphadenopathy consistent with progression of disease.  Attempted patient on venetoclax, but she could not tolerate it secondary to persistent diarrhea and transaminitis.   ASSESSMENT: CLL.  PLAN:    CLL: Confirmed by peripheral blood flow cytometry.  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  Patient completed 4 weekly cycles of Rituxan and now has transitioned to treatment every 4 weeks.  Her most recent CT scan on March 21, 2023 reviewed independently with only minimal progression of disease with 1 or 2 mm growth in multiple lymph nodes. Her left axillary lymph node did increase from 13 mm to 20 mm.  Follow-up PET scan on April 16, 2023 confirmed mild progression of disease.   She is symptomatic and will initiate XRT later this week to her left axilla.  Continue Claritin and famotidine along with her 10 mg Revlimid daily.  Patient has discontinued prednisone.  Proceed with cycle 17 of Rituxan today.  No further Rituxan is needed, therefore patient will continue with single agent Revlimid.  Return to clinic in 6 weeks for laboratory work and evaluation by clinical pharmacy.  Patient will then return to clinic in 3 months with repeat imaging, further evaluation, and continuation of treatment.  If patient had further progressive disease, we will add back to Rituxan.  Leukopenia: Chronic and unchanged.  Patient's total white blood cell count is 2.6.  Monitor. Thrombocytopenia: Platelet count has trended down slightly to 85.  Monitor.  Trigeminal neuralgia: Resolved.  MRI of the brain on Jan 25, 2019 was unremarkable.   Depression/anxiety: Chronic and unchanged.  Continue follow-up and treatment per primary care. Neuropathic pain/peripheral neuropathy: Chronic and unchanged.   She reports she is now on disability.   Continue current follow-up and treatment as per neurology. Rituxan reaction: Patient has been given additional premedications for preventative measures.  Rash: Resolved.  Continue Claritin and famotidine along with Revlimid.  Weight gain: Patient has discontinued prednisone. Fatigue/axillary tenderness: XRT as above.   Patient expressed understanding and was in agreement with this plan. She also understands that She can call clinic at any time with any questions, concerns, or complaints.    Jeralyn Ruths, MD   05/07/2023 9:20 AM

## 2023-05-08 ENCOUNTER — Other Ambulatory Visit (HOSPITAL_COMMUNITY): Payer: Self-pay

## 2023-05-12 ENCOUNTER — Ambulatory Visit
Admission: EM | Admit: 2023-05-12 | Discharge: 2023-05-12 | Disposition: A | Discharge status: Home / Self Care | Payer: Medicare PPO | Attending: Emergency Medicine | Admitting: Emergency Medicine

## 2023-05-12 ENCOUNTER — Ambulatory Visit: Payer: Self-pay | Admitting: *Deleted

## 2023-05-12 ENCOUNTER — Ambulatory Visit
Admission: RE | Admit: 2023-05-12 | Discharge: 2023-05-12 | Disposition: A | Discharge status: Home / Self Care | Payer: Medicare PPO | Source: Ambulatory Visit | Attending: Radiation Oncology | Admitting: Radiation Oncology

## 2023-05-12 ENCOUNTER — Encounter: Payer: Self-pay | Admitting: Radiation Oncology

## 2023-05-12 VITALS — BP 107/67 | HR 75 | Temp 97.2°F | Resp 16 | Wt 185.0 lb

## 2023-05-12 DIAGNOSIS — R2241 Localized swelling, mass and lump, right lower limb: Secondary | ICD-10-CM

## 2023-05-12 DIAGNOSIS — C9110 Chronic lymphocytic leukemia of B-cell type not having achieved remission: Secondary | ICD-10-CM | POA: Insufficient documentation

## 2023-05-12 MED ORDER — PREDNISONE 20 MG PO TABS: 40.0000 mg | ORAL_TABLET | Freq: Every day | ORAL | 0 refills | Status: DC

## 2023-05-12 MED ORDER — CEPHALEXIN 500 MG PO CAPS: 500.0000 mg | ORAL_CAPSULE | Freq: Three times a day (TID) | ORAL | 0 refills | Status: AC

## 2023-05-12 NOTE — Telephone Encounter (Signed)
Reason for Disposition  [1] Red or very tender (to touch) area AND [2] getting larger over 48 hours after the bite  Answer Assessment - Initial Assessment Questions 1. TYPE of INSECT: "What type of insect was it?"      unsure 2. ONSET: "When did you get bitten?"      [place] am- woke with bite 3. LOCATION: "Where is the insect bite located?"      R foot- top 4. REDNESS: "Is the area red or pink?" If Yes, ask: "What size is area of redness?" (inches or cm). "When did the redness start?"     Red and inflamed- whole top 5. PAIN: "Is there any pain?" If Yes, ask: "How bad is it?"  (Scale 1-10; or mild, moderate, severe)     Hurts to walk with shoes on-tight 6. ITCHING: "Does it itch?" If Yes, ask: "How bad is the itch?"    - MILD: doesn't interfere with normal activities   - MODERATE-SEVERE: interferes with work, school, sleep, or other activities      Little itching 7. SWELLING: "How big is the swelling?" (inches, cm, or compare to coins)     Swelling top 8. OTHER SYMPTOMS: "Do you have any other symptoms?"  (e.g., difficulty breathing, hives)     no  Protocols used: Insect Bite-A-AH

## 2023-05-12 NOTE — ED Triage Notes (Signed)
Patient to Urgent Care with complaints of right sided foot redness and swelling.  Reports insect bite to the top of her foot occurred on Thursday. Area very tender to the touch and has difficulty wearing shoes. Unsure of what kind of insect.  Denies other symptoms.

## 2023-05-12 NOTE — ED Provider Notes (Signed)
Renaldo Fiddler    CSN: [id number] Arrival date & time: 05/12/23  1054      History   Chief Complaint Chief Complaint  Patient presents with   Foot Pain    HPI Monique [name] is a 52 y.o. female.   Patient presents for evaluation of right foot erythema and swelling beginning 4 days ago after possible insect bite.  Did not witness but able to see pinpoint mark on the top of foot.  Pain experience with bearing weight and when shoes applied..  Denies presence of drainage or fever.  Has not attempted treatment.  Past Medical History:  Diagnosis Date   Anxiety    Bursitis    leg pain   Carpal tunnel syndrome    Cervical dysplasia    hx LEEP over 18 years ago.    Chronic lymphocytic leukemia (HCC) 2018   Dr Orlie Dakin.  (in lymph nodes)   COPD (chronic obstructive pulmonary disease) (HCC)    COVID-19 2021   Depression    Eating disorder    GERD (gastroesophageal reflux disease)    History of self-harm    Insomnia    Obsession    Tobacco abuse    Vitamin B12 deficiency (non anemic)     Patient Active Problem List   Diagnosis Date Noted   Post-menopause on HRT (hormone replacement therapy) 11/08/2022   At risk for sleep apnea 09/19/2021   Lumbar foraminal stenosis (L4-5) (Bilateral) (R>L) 06/05/2021   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 06/05/2021   Lumbosacral radiculopathy/radiculitis at L5 (Bilateral) 06/05/2021   PAD (peripheral artery disease) (HCC) 02/14/2021   CIN II (cervical intraepithelial neoplasia II) 02/01/2021   S/P vaginal hysterectomy 01/22/2021   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/09/2020   Bilateral leg pain 11/03/2019   Iron deficiency anemia 10/15/2019   Bilateral arm pain 08/05/2019   Bilateral hand numbness 08/05/2019   Weakness of both hands 08/05/2019   Chronic musculoskeletal pain 07/19/2019   Chronic neuropathic pain 07/19/2019   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   Special screening for  malignant neoplasms, colon    Polyp of sigmoid colon    Hemorrhoids    Trigeminal neuralgia 10/05/2018   Lumbar radiculitis (Right) 09/24/2018   Hypocalcemia 06/03/2018   Vitamin D insufficiency 06/03/2018   Abnormal MRI, lumbar spine (06/02/2020) 06/03/2018   Chronic hip pain (Right) 06/03/2018   Spondylosis without myelopathy or radiculopathy, lumbar region 06/03/2018   DDD (degenerative disc disease), lumbar 06/02/2018   Lumbar facet hypertrophy 06/02/2018   Lumbar facet arthropathy 06/02/2018   Lumbar facet syndrome (Bilateral) (R>L) 06/02/2018   Marijuana use 05/19/2018   Chronic lower extremity pain (1ry area of Pain) (Bilateral) (R>L) 05/13/2018   Chronic low back pain (2ry area of Pain) (Bilateral) (R>L) w/ sciatica (Bilateral) 05/13/2018   Chronic pain syndrome 05/13/2018   Disorder of skeletal system 05/13/2018   Pharmacologic therapy 05/13/2018   Problems influencing health status 05/13/2018   Mastalgia 05/05/2018   Breast mass, right 01/07/2018   Face pain 12/11/2017   Tingling 12/11/2017   Thoracic aortic atherosclerosis (HCC) 08/20/2017   Emphysema of lung (HCC) 08/20/2017   Adductor tendinitis 04/23/2017   Trochanteric bursitis of right hip 04/23/2017   GERD without esophagitis 12/11/2016   CLL (chronic lymphocytic leukemia) (HCC) 11/24/2016   Stress incontinence 09/04/2016   B12 deficiency 07/10/2015   Insomnia 07/10/2015   Anorexia nervosa, restricting type 07/10/2015   Anxiety, generalized 07/10/2015   H/O suicide attempt 07/10/2015  Lymphocytosis 07/10/2015   Obsessive-compulsive disorder 07/10/2015   Tobacco use 07/10/2015   History of cervical dysplasia 07/18/2014    Past Surgical History:  Procedure Laterality Date   BREAST BIOPSY Right 01/05/2018   US guided biopsy of 2 areas and 1 lymph node, MIXED INFLAMMATION AND GIANT CELL REACTION   CERVICAL BIOPSY  W/ LOOP ELECTRODE EXCISION     COLONOSCOPY WITH PROPOFOL N/A 04/21/2019   Procedure:  COLONOSCOPY WITH PROPOFOL;  Surgeon: Pasty Spillers, MD;  Location: [facility];  Service: Gastroenterology;  Laterality: N/A;   OTHER SURGICAL HISTORY     scar tissue removed from vocal cords   TUBAL LIGATION     VAGINAL HYSTERECTOMY N/A 01/22/2021   Procedure: HYSTERECTOMY VAGINAL; BILATERAL SALPINGECTOMY;  Surgeon: Hildred Laser, MD;  Location: [facility] ORS;  Service: Gynecology;  Laterality: N/A;   vocal cord surgery  2005    OB History     Gravida  8   Para  4   Term  4   Preterm      AB  4   Living  4      SAB  4   IAB      Ectopic      Multiple      Live Births  4        Obstetric Comments  1st Menstrual Cycle:  10  1st Pregnancy:  19           Home Medications    Prior to Admission medications   Medication Sig Start Date End Date Taking? Authorizing Provider  cephALEXin (KEFLEX) 500 MG capsule Take 1 capsule (500 mg total) by mouth 3 (three) times daily for 5 days. 05/12/23 05/17/23 Yes Peg Fifer R, NP  predniSONE (DELTASONE) 20 MG tablet Take 2 tablets (40 mg total) by mouth daily. 05/12/23  Yes Gianmarco Roye R, NP  albuterol (VENTOLIN HFA) 108 (90 Base) MCG/ACT inhaler Inhale 2 puffs into the lungs every 6 (six) hours as needed for wheezing or shortness of breath. 08/13/22   Berniece Salines, FNP  ARIPiprazole (ABILIFY) 5 MG tablet Take 1 tablet (5 mg total) by mouth daily. 04/21/23   Alba Cory, MD  Ascorbic Acid (VITAMIN C) 1000 MG tablet Take 1,000 mg by mouth daily as needed (immune health).    [provider]  ASPIRIN 81 PO Take 81 mg by mouth daily.    [provider]  atorvastatin (LIPITOR) 10 MG tablet Take 1 tablet (10 mg total) by mouth in the morning. 04/21/23   Alba Cory, MD  atorvastatin (LIPITOR) 10 MG tablet Take 1 tablet (10 mg total) by mouth at bedtime. 04/21/23   Alba Cory, MD  Calcium Carbonate-Vit D-Min (GNP CALCIUM 1200) 1200-1000 MG-UNIT CHEW Chew 1,200 mg by mouth daily with breakfast.  Take in combination with vitamin D and magnesium. 08/07/20 05/12/23  Delano Metz, MD  clobetasol cream (TEMOVATE) 0.05 % Apply topically to bites on lower legs as directed daily up to 5 days a week until clear then use as needed for flares 05/15/22   Deirdre Evener, MD  estrogens, conjugated, (PREMARIN) 0.625 MG tablet Take by mouth.    [provider]  estrogens, conjugated, (PREMARIN) 0.625 MG tablet Take 1 tablet (0.625 mg total) by mouth daily for 21 days, then do not take for 7 days. 12/16/22     famotidine (PEPCID) 10 MG tablet Take by mouth.    [provider]  Glycopyrrolate-Formoterol (BEVESPI AEROSPHERE) 9-4.8 MCG/ACT AERO Inhale 2  puffs into the lungs daily. 03/24/23   Alba Cory, MD  Glycopyrrolate-Formoterol (BEVESPI AEROSPHERE) 9-4.8 MCG/ACT AERO Inhale 2 puffs by mouth into the lungs once daily. 03/24/23   Alba Cory, MD  lenalidomide (REVLIMID) 10 MG capsule TAKE 1 CAPSULE (10MG  TOTAL) BY MOUTH DAILY 04/23/23   Jeralyn Ruths, MD  loratadine (CLARITIN) 10 MG tablet Take 1 tablet (10 mg total) by mouth every morning. 10/15/22   Alba Cory, MD  loratadine (CLARITIN) 10 MG tablet Take 1 tablet (10 mg total) by mouth daily in the morning. 10/15/22   Alba Cory, MD  Magnesium 500 MG CAPS Take 500 mg by mouth in the morning.    [provider]  Magnesium 500 MG CAPS Take 1 capsule (500 mg total) by mouth daily. 03/19/23   Hildred Laser, MD  meloxicam (MOBIC) 15 MG tablet Take 1 tablet (15 mg total) by mouth daily as needed for pain. 04/21/23   Alba Cory, MD  meloxicam (MOBIC) 15 MG tablet Take 1 tablet (15 mg total) by mouth every morning. 04/21/23   Alba Cory, MD  omeprazole (PRILOSEC) 20 MG capsule Take 1 capsule (20 mg total) by mouth daily in the morning. 04/21/23   Alba Cory, MD  pregabalin (LYRICA) 100 MG capsule Take 100 mg by mouth 3 (three) times daily. 06/05/20   Morene Crocker, MD  PREMARIN 0.625 MG tablet TAKE ONE  TABLET BY MOUTH ONCE daily FOR 21 DAYS THEN DO not take FOR 7 days 12/16/22   Hildred Laser, MD  tiZANidine (ZANAFLEX) 2 MG tablet Take 1 tablet (2 mg total) by mouth at bedtime. 04/21/23   Alba Cory, MD  umeclidinium-vilanterol (ANORO ELLIPTA) 62.5-25 MCG/ACT AEPB Inhale 1 puff into the lungs daily. 10/15/22   Alba Cory, MD  vitamin B-12 (CYANOCOBALAMIN) 500 MCG tablet Take 250 mcg by mouth every morning. 02/26/21   [provider]    Family History Family History  Problem Relation Age of Onset   Depression Mother    Cancer Mother        thyroid   Alcohol abuse Father    COPD Father    Alcohol abuse Brother    Depression Brother    Bipolar disorder Brother    Suicidality Brother    ADD / ADHD Son    Breast cancer Neg Hx     Social History Social History   Tobacco Use   Smoking status: Every Day    Current packs/day: 1.00    Average packs/day: 1 pack/day for 36.6 years (36.6 ttl pk-yrs)    Types: Cigarettes    Start date: 09/25/1986   Smokeless tobacco: Never  Vaping Use   Vaping status: Former  Substance Use Topics   Alcohol use: Not Currently    Alcohol/week: 0.0 standard drinks of alcohol    Comment: rarely   Drug use: Yes    Frequency: 7.0 times per week    Types: Marijuana     Allergies   Doxycycline and Duloxetine   Review of Systems Review of Systems   Physical Exam Triage Vital Signs ED Triage Vitals  Encounter Vitals Group     BP 05/12/23 1122 101/68     Systolic BP Percentile --      Diastolic BP Percentile --      Pulse Rate 05/12/23 1122 79     Resp 05/12/23 1122 17     Temp 05/12/23 1122 98.3 F (36.8 C)     Temp Source 05/12/23 1122 Oral  SpO2 05/12/23 1122 96 %     Weight --      Height --      Head Circumference --      Peak Flow --      Pain Score 05/12/23 1125 6     Pain Loc --      Pain Education --      Exclude from Growth Chart --    No data found.  Updated Vital Signs BP 101/68 (BP Location: Right Arm)    Pulse 79   Temp 98.3 F (36.8 C) (Oral)   Resp 17   LMP 08/17/2016   SpO2 96%   Visual Acuity Right Eye Distance:   Left Eye Distance:   Bilateral Distance:    Right Eye Near:   Left Eye Near:    Bilateral Near:     Physical Exam Constitutional:      Appearance: Normal appearance.  Eyes:     Extraocular Movements: Extraocular movements intact.  Pulmonary:     Effort: Pulmonary effort is normal.  Skin:    Comments: Erythema across the dorsal aspect of the midfoot with moderate +1 edema, less than 0.5 cm black puncture near the third or fourth metatarsal, no drainage noted, tender to palpation, able to bear weight, 2+ pedal pulse  Neurological:     Mental Status: She is alert and oriented to person, place, and time. Mental status is at baseline.      UC Treatments / Results  Labs (all labs ordered are listed, but only abnormal results are displayed) Labs Reviewed - No data to display  EKG   Radiology No results found.  Procedures Procedures (including critical care time)  Medications Ordered in UC Medications - No data to display  Initial Impression / Assessment and Plan / UC Course  I have reviewed the triage vital signs and the nursing notes.  Pertinent labs & imaging results that were available during my care of the patient were reviewed by me and considered in my medical decision making (see chart for details).  Localized Swelling of right foot  Sensation not fully consistent with cellulitis, discussed with patient, erythema and edema present on exam, has not worsened but has persisted, prophylactically providing bacterial coverage with cephalexin as well as prednisone for management of edema, advised ice, elevation Tylenol and compression for additional support, endorses that heat exacerbates symptoms, recommended follow-up visit with PCP in 1 week for reevaluation, may follow-up with urgent care if unable to be seen  Final Clinical Impressions(s) / UC  Diagnoses   Final diagnoses:  Localized swelling of right foot     Discharge Instructions      Today you are evaluated for your insect bite, swelling is present at this time low suspicion for infection due to lack of drainage, heat to the skin and fever however symptoms have persisted for 4 days will prophylactically provide coverage with antibiotic  Take cephalexin every 8 hours for the next 5 days  Begin use of prednisone every morning with food for 5 days to help reduce swelling, may use Tylenol while using this medicine to help with pain  As heat provides discomfort you may use ice over the affected area in 10 to 15-minute intervals for comfort and to help reduce swelling  You may elevate whenever sitting and lying to help reduce swelling  You may use compression socks or hosiery to help promote blood flow which will help reduce swelling  Ideally schedule follow-up appointment with your primary  doctor next week for reevaluation of symptoms, if unable to be seen and you have concerns regarding healing you may follow-up with this urgent care for reevaluation   ED Prescriptions     Medication Sig Dispense Auth. Provider   cephALEXin (KEFLEX) 500 MG capsule Take 1 capsule (500 mg total) by mouth 3 (three) times daily for 5 days. 15 capsule Kevyn Wengert R, NP   predniSONE (DELTASONE) 20 MG tablet Take 2 tablets (40 mg total) by mouth daily. 10 tablet Valinda Hoar, NP      PDMP not reviewed this encounter.   Valinda Hoar, [place] 05/12/23 [phone]

## 2023-05-12 NOTE — Telephone Encounter (Signed)
  Chief Complaint: insect bite to top of foot Symptoms: unsure what bit her foot- small hole- she woke with this Trussday- red, swelling, painful to walk and wear shoes Frequency: since Thursday  Pertinent Negatives: Patient denies fever Disposition: [] ED /[x] Urgent Care (no appt availability in office) / [] Appointment(In office/virtual)/ []  [facility]/ [] Home Care/ [] Refused Recommended Disposition /[] [facility]/ []  Follow-up with PCP Additional Notes: Call to office- no open appointment- advised UC

## 2023-05-12 NOTE — Discharge Instructions (Addendum)
Today you are evaluated for your insect bite, swelling is present at this time low suspicion for infection due to lack of drainage, heat to the skin and fever however symptoms have persisted for 4 days will prophylactically provide coverage with antibiotic  Take cephalexin every 8 hours for the next 5 days  Begin use of prednisone every morning with food for 5 days to help reduce swelling, may use Tylenol while using this medicine to help with pain  As heat provides discomfort you may use ice over the affected area in 10 to 15-minute intervals for comfort and to help reduce swelling  You may elevate whenever sitting and lying to help reduce swelling  You may use compression socks or hosiery to help promote blood flow which will help reduce swelling  Ideally schedule follow-up appointment with your primary doctor next week for reevaluation of symptoms, if unable to be seen and you have concerns regarding healing you may follow-up with this urgent care for reevaluation

## 2023-05-12 NOTE — Progress Notes (Signed)
Radiation Oncology Follow up Note  Name: Monique [name]   Date:   05/12/2023 MRN:  [id number] DOB: 04-Mar-1971    This 52 y.o. female presents to the clinic today for reevaluation status post PET CT scan and patient with known CLL and bulky and painful disease in her left axilla.  REFERRING PROVIDER: Alba Cory, MD  HPI: Patient is a 52 year old female with known CLL.  I saw her initial consultation back in July when she was having pain in her left axilla and CT scan evidence of bulky adenopathy.  I ran a PET CT scan which did demonstrate adenopathy in the left axilla as well as multiple areas throughout her body.  She continues have pain in the left axilla she is seen today for consideration of palliative treatment to that area..  COMPLICATIONS OF TREATMENT: none  FOLLOW UP COMPLIANCE: keeps appointments   PHYSICAL EXAM:  BP 107/67   Pulse 75   Temp (!) 97.2 F (36.2 C) (Tympanic)   Resp 16   Wt 185 lb (83.9 kg)   LMP 08/17/2016   BMI 28.98 kg/m  Well-developed well-nourished patient in NAD. HEENT reveals PERLA, EOMI, discs not visualized.  Oral cavity is clear. No oral mucosal lesions are identified. Neck is clear without evidence of cervical or supraclavicular adenopathy. Lungs are clear to A&P. Cardiac examination is essentially unremarkable with regular rate and rhythm without murmur rub or thrill. Abdomen is benign with no organomegaly or masses noted. Motor sensory and DTR levels are equal and symmetric in the upper and lower extremities. Cranial nerves II through XII are grossly intact. Proprioception is intact. No peripheral adenopathy or edema is identified. No motor or sensory levels are noted. Crude visual fields are within normal range.  RADIOLOGY RESULTS: PET CT scan reviewed compatible with above-stated findings  PLAN: Present time like to go ahead with 400 cGy in 2 fractions.  Recent data shows this is an excellent dose for palliation of pain and discomfort for  bulky adenopathy and CLL.  Patient was simulated today.  Risks and benefits of treatment clued an extremely low side effect profile possibly mild skin reaction were reviewed with the patient.  Patient has consented and CT scan was performed for simulation.  I would like to take this opportunity to thank you for allowing me to participate in the care of your patient.[name], MD

## 2023-05-13 DIAGNOSIS — C9110 Chronic lymphocytic leukemia of B-cell type not having achieved remission: Secondary | ICD-10-CM | POA: Diagnosis not present

## 2023-05-14 ENCOUNTER — Other Ambulatory Visit: Payer: Self-pay | Admitting: Oncology

## 2023-05-14 DIAGNOSIS — C9110 Chronic lymphocytic leukemia of B-cell type not having achieved remission: Secondary | ICD-10-CM

## 2023-05-15 ENCOUNTER — Ambulatory Visit: Payer: Medicare PPO

## 2023-05-16 ENCOUNTER — Encounter: Payer: Self-pay | Admitting: Oncology

## 2023-05-20 ENCOUNTER — Ambulatory Visit: Admission: RE | Admit: 2023-05-20 | Payer: Medicare PPO | Source: Ambulatory Visit

## 2023-05-20 DIAGNOSIS — C9110 Chronic lymphocytic leukemia of B-cell type not having achieved remission: Secondary | ICD-10-CM | POA: Insufficient documentation

## 2023-05-20 DIAGNOSIS — Z510 Encounter for antineoplastic radiation therapy: Secondary | ICD-10-CM | POA: Insufficient documentation

## 2023-05-21 ENCOUNTER — Encounter: Payer: Self-pay | Admitting: Oncology

## 2023-05-21 ENCOUNTER — Ambulatory Visit (INDEPENDENT_AMBULATORY_CARE_PROVIDER_SITE_OTHER): Payer: Medicare PPO | Admitting: Family Medicine

## 2023-05-21 ENCOUNTER — Other Ambulatory Visit: Payer: Self-pay | Admitting: Family Medicine

## 2023-05-21 ENCOUNTER — Encounter: Payer: Self-pay | Admitting: Family Medicine

## 2023-05-21 ENCOUNTER — Other Ambulatory Visit (HOSPITAL_COMMUNITY): Payer: Self-pay

## 2023-05-21 ENCOUNTER — Other Ambulatory Visit: Payer: Self-pay

## 2023-05-21 ENCOUNTER — Ambulatory Visit
Admission: RE | Admit: 2023-05-21 | Discharge: 2023-05-21 | Disposition: A | Discharge status: Home / Self Care | Payer: Medicare PPO | Source: Ambulatory Visit | Attending: Radiation Oncology | Admitting: Radiation Oncology

## 2023-05-21 VITALS — BP 118/68 | HR 88 | Temp 97.9°F | Ht 67.0 in | Wt 184.9 lb

## 2023-05-21 DIAGNOSIS — L299 Pruritus, unspecified: Secondary | ICD-10-CM

## 2023-05-21 DIAGNOSIS — D849 Immunodeficiency, unspecified: Secondary | ICD-10-CM | POA: Diagnosis not present

## 2023-05-21 DIAGNOSIS — R600 Localized edema: Secondary | ICD-10-CM | POA: Diagnosis not present

## 2023-05-21 DIAGNOSIS — C9110 Chronic lymphocytic leukemia of B-cell type not having achieved remission: Secondary | ICD-10-CM | POA: Diagnosis not present

## 2023-05-21 DIAGNOSIS — Z510 Encounter for antineoplastic radiation therapy: Secondary | ICD-10-CM | POA: Diagnosis not present

## 2023-05-21 LAB — RAD ONC ARIA SESSION SUMMARY
Course Elapsed Days: 0
Plan Fractions Treated to Date: 1
Plan Prescribed Dose Per Fraction: 2 Gy
Plan Total Fractions Prescribed: 2
Plan Total Prescribed Dose: 4 Gy
Reference Point Dosage Given to Date: 2 Gy
Reference Point Session Dosage Given: 2 Gy
Session Number: 1

## 2023-05-21 MED ORDER — CETIRIZINE HCL 10 MG PO TABS
10.0000 mg | ORAL_TABLET | Freq: Every day | ORAL | 0 refills | Status: DC
Start: 2023-05-21 — End: 2023-06-12
  Filled 2023-05-21 – 2023-05-23 (×3): qty 28, 28d supply, fill #0

## 2023-05-21 NOTE — Patient Instructions (Signed)
Try applying cortisone 10 to the area as long as the skin is intact, you can also apply vaseline - this will help with the healing of the top layer of skin that may get dry or peel - this is normal and ok as long as its not hurting you or having fluid drain. You can apply ice packs and elevate the foot to help with the swelling (will tend to last longer and be worse just because of gravity, area of anatomy)  Follow up if any spreading rash and worsening swelling/redness/pain  We would need to recheck you At this time I do not think more steroids or antibiotics are indicated.  Looks most like a continued local allergic reaction   Add zyrtec to your other meds for about 1 to 2 weeks

## 2023-05-21 NOTE — Progress Notes (Signed)
Patient ID: Monique [name], female    DOB: 07/15/1971, 52 y.o.   MRN: [id number]  PCP: Alba Cory, MD  Chief Complaint  Patient presents with   Insect Bite    Was seen at urgent care 2 weeks ago, still red, swollen, and tender to the touch    Subjective:   Monique [name] is a 52 y.o. 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 06/05/2021   Lumbosacral radiculopathy/radiculitis at L5 (Bilateral) 06/05/2021   PAD (peripheral artery disease) (HCC) 02/14/2021   CIN II (cervical intraepithelial neoplasia II) 02/01/2021   S/P vaginal hysterectomy 01/22/2021   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/09/2020   Bilateral leg pain 11/03/2019   Iron deficiency anemia 10/15/2019   Bilateral arm pain 08/05/2019   Bilateral hand numbness 08/05/2019   Weakness of both hands 08/05/2019   Chronic musculoskeletal pain 07/19/2019   Chronic neuropathic pain 07/19/2019   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   Special screening for malignant neoplasms, colon    Polyp of sigmoid colon    Hemorrhoids    Trigeminal neuralgia 10/05/2018   Lumbar radiculitis (Right) 09/24/2018   Hypocalcemia 06/03/2018   Vitamin D insufficiency 06/03/2018   Abnormal MRI, lumbar spine (06/02/2020) 06/03/2018   Chronic hip pain (Right) 06/03/2018   Spondylosis without myelopathy or radiculopathy, lumbar region 06/03/2018   DDD (degenerative disc disease), lumbar 06/02/2018    Lumbar facet hypertrophy 06/02/2018   Lumbar facet arthropathy 06/02/2018   Lumbar facet syndrome (Bilateral) (R>L) 06/02/2018   Marijuana use 05/19/2018   Chronic lower extremity pain (1ry area of Pain) (Bilateral) (R>L) 05/13/2018   Chronic low back pain (2ry area of Pain) (Bilateral) (R>L) w/ sciatica (Bilateral) 05/13/2018   Chronic pain syndrome 05/13/2018   Disorder of skeletal system 05/13/2018   Pharmacologic therapy 05/13/2018   Problems influencing health status 05/13/2018   Mastalgia 05/05/2018   Breast mass, right 01/07/2018   Face pain 12/11/2017   Tingling 12/11/2017   Thoracic aortic atherosclerosis (HCC) 08/20/2017   Emphysema of lung (HCC) 08/20/2017   Adductor tendinitis 04/23/2017   Trochanteric bursitis of right hip 04/23/2017   GERD without esophagitis 12/11/2016   CLL (chronic lymphocytic leukemia) (HCC) 11/24/2016   Stress incontinence 09/04/2016   B12 deficiency 07/10/2015   Insomnia 07/10/2015   Anorexia nervosa, restricting type 07/10/2015   Anxiety, generalized 07/10/2015   H/O suicide attempt 07/10/2015   Lymphocytosis 07/10/2015   Obsessive-compulsive disorder 07/10/2015   Tobacco use 07/10/2015   History of cervical dysplasia 07/18/2014      Current Outpatient Medications:    albuterol (VENTOLIN HFA) 108 (90 Base) MCG/ACT inhaler, Inhale 2 puffs into the lungs every 6 (six) hours as needed for wheezing or shortness of breath., Disp: 8 g, Rfl: 0   ARIPiprazole (ABILIFY) 5 MG tablet, Take 1 tablet (5 mg total) by mouth daily., Disp: 90 tablet, Rfl: 1   Ascorbic Acid (VITAMIN C) 1000 MG tablet, Take 1,000 mg by mouth  daily as needed (immune health)., Disp: , Rfl:    ASPIRIN 81 PO, Take 81 mg by mouth daily., Disp: , Rfl:    atorvastatin (LIPITOR) 10 MG tablet, Take 1 tablet (10 mg total) by mouth in the morning., Disp: 90 tablet, Rfl: 1   atorvastatin (LIPITOR) 10 MG tablet, Take 1 tablet (10 mg total) by mouth at bedtime., Disp: 90 tablet, Rfl: 1    cetirizine (ZYRTEC) 10 MG tablet, Take 1 tablet (10 mg total) by mouth daily., Disp: 28 tablet, Rfl: 0   clobetasol cream (TEMOVATE) 0.05 %, Apply topically to bites on lower legs as directed daily up to 5 days a week until clear then use as needed for flares, Disp: 45 g, Rfl: 1   estrogens, conjugated, (PREMARIN) 0.625 MG tablet, Take by mouth., Disp: , Rfl:    estrogens, conjugated, (PREMARIN) 0.625 MG tablet, Take 1 tablet (0.625 mg total) by mouth daily for 21 days, then do not take for 7 days., Disp: 90 tablet, Rfl: 2   famotidine (PEPCID) 10 MG tablet, Take by mouth., Disp: , Rfl:    Glycopyrrolate-Formoterol (BEVESPI AEROSPHERE) 9-4.8 MCG/ACT AERO, Inhale 2 puffs into the lungs daily., Disp: 10.7 g, Rfl: 3   Glycopyrrolate-Formoterol (BEVESPI AEROSPHERE) 9-4.8 MCG/ACT AERO, Inhale 2 puffs by mouth into the lungs once daily., Disp: 10.7 g, Rfl: 1   lenalidomide (REVLIMID) 10 MG capsule, TAKE 1 CAPSULE (10MG  TOTAL) BY MOUTH DAILY, Disp: 28 capsule, Rfl: 0   loratadine (CLARITIN) 10 MG tablet, Take 1 tablet (10 mg total) by mouth every morning., Disp: 90 tablet, Rfl: 1   loratadine (CLARITIN) 10 MG tablet, Take 1 tablet (10 mg total) by mouth daily in the morning., Disp: 90 tablet, Rfl: 1   Magnesium 500 MG CAPS, Take 500 mg by mouth in the morning., Disp: , Rfl:    Magnesium 500 MG CAPS, Take 1 capsule (500 mg total) by mouth daily., Disp: 30 capsule, Rfl: 99   meloxicam (MOBIC) 15 MG tablet, Take 1 tablet (15 mg total) by mouth daily as needed for pain., Disp: 90 tablet, Rfl: 1   meloxicam (MOBIC) 15 MG tablet, Take 1 tablet (15 mg total) by mouth every morning., Disp: 150 tablet, Rfl: 0   omeprazole (PRILOSEC) 20 MG capsule, Take 1 capsule (20 mg total) by mouth daily in the morning., Disp: 150 capsule, Rfl: 0   predniSONE (DELTASONE) 20 MG tablet, Take 2 tablets (40 mg total) by mouth daily., Disp: 10 tablet, Rfl: 0   pregabalin (LYRICA) 100 MG capsule, Take 100 mg by mouth 3 (three) times  daily., Disp: , Rfl:    PREMARIN 0.625 MG tablet, TAKE ONE TABLET BY MOUTH ONCE daily FOR 21 DAYS THEN DO not take FOR 7 days, Disp: 90 tablet, Rfl: 3   tiZANidine (ZANAFLEX) 2 MG tablet, Take 1 tablet (2 mg total) by mouth at bedtime., Disp: 90 tablet, Rfl: 1   umeclidinium-vilanterol (ANORO ELLIPTA) 62.5-25 MCG/ACT AEPB, Inhale 1 puff into the lungs daily., Disp: 60 each, Rfl: 5   vitamin B-12 (CYANOCOBALAMIN) 500 MCG tablet, Take 250 mcg by mouth every morning., Disp: , Rfl:    Calcium Carbonate-Vit D-Min (GNP CALCIUM 1200) 1200-1000 MG-UNIT CHEW, Chew 1,200 mg by mouth daily with breakfast. Take in combination with vitamin D and magnesium., Disp: 90 tablet, Rfl: 3   Allergies  Allergen Reactions   Doxycycline Rash    Rash on cheeks and sides of nose    Duloxetine Rash     Social History  Tobacco Use   Smoking status: Every Day    Current packs/day: 1.00    Average packs/day: 1 pack/day for 36.7 years (36.7 ttl pk-yrs)    Types: Cigarettes    Start date: 09/25/1986   Smokeless tobacco: Never  Vaping Use   Vaping status: Former  Substance Use Topics   Alcohol use: Not Currently    Alcohol/week: 0.0 standard drinks of alcohol    Comment: rarely   Drug use: Yes    Frequency: 7.0 times per week    Types: Marijuana      Chart Review Today: I personally reviewed active problem list, medication list, allergies, family history, social history, health maintenance, notes from last encounter, lab results, imaging with the patient/caregiver today.   Review of Systems  Constitutional: Negative.   HENT: Negative.    Eyes: Negative.   Respiratory: Negative.    Cardiovascular: Negative.   Gastrointestinal: Negative.   Endocrine: Negative.   Genitourinary: Negative.   Musculoskeletal: Negative.   Skin: Negative.   Allergic/Immunologic: Negative.   Neurological: Negative.   Hematological: Negative.   Psychiatric/Behavioral: Negative.    All other systems reviewed and are  negative.      Objective:   Vitals:   05/21/23 1351  BP: 118/68  Pulse: 88  Temp: 97.9 F (36.6 C)  SpO2: 96%  Weight: 184 lb 14.4 oz (83.9 kg)  Height: 5\' 7"  (1.702 m)    Body mass index is 28.96 kg/m.  Physical Exam Vitals and nursing note reviewed.  Constitutional:      Appearance: She is well-developed.  HENT:     Head: Normocephalic and atraumatic.     Nose: Nose normal.  Eyes:     General:        Right eye: No discharge.        Left eye: No discharge.     Conjunctiva/sclera: Conjunctivae normal.  Neck:     Trachea: No tracheal deviation.  Cardiovascular:     Rate and Rhythm: Normal rate and regular rhythm.  Pulmonary:     Effort: Pulmonary effort is normal. No respiratory distress.     Breath sounds: No stridor.  Skin:    General: Skin is warm and dry.     Findings: No rash.  Neurological:     Mental Status: She is alert.     Motor: No abnormal muscle tone.     Coordination: Coordination normal.  Psychiatric:        Behavior: Behavior normal.      Results for orders placed or performed in visit on 05/21/23  Rad Onc [facility] Session Summary  Result Value Ref Range   Course ID C1_Chest    Course Intent Palliative    Course Start Date 05/12/2023  9:59 AM    Session Number 1    Course First Treatment Date 05/21/2023 11:38 AM    Course Last Treatment Date 05/21/2023 11:39 AM    Course Elapsed Days 0    Reference Point ID Chest    Reference Point Dosage Given to Date 2 Gy   Reference Point Session Dosage Given 2 Gy   Plan ID Chest    Plan Name Axilla_Lt    Plan Fractions Treated to Date 1    Plan Total Fractions Prescribed 2    Plan Prescribed Dose Per Fraction 2 Gy   Plan Total Prescribed Dose 4.000000 Gy   Plan Primary Reference Point Chest    *Note: Due to a large number of results and/or encounters for  the requested time period, some results have not been displayed. A complete set of results can be found in Results Review.       Assessment & Plan:    1. Pruritic condition Possible bug bite/sting 2 weeks ago, some improvement but continued itching/swelling - likely continued local allergic rxn, previously tx with abx and steroids, still itchy Trial of antihistamien and see plan below - cetirizine (ZYRTEC) 10 MG tablet; Take 1 tablet (10 mg total) by mouth daily.  Dispense: 28 tablet; Refill: 0  2. Edema of right foot Some darker red color, looks improved compared to UC visit photo, some continued edema - likely due to prolonged local allergic rxn and dependent location, color change is likely some healing, edema is improved as well, not indurated, hot, spreading or very tender  - see plan below, watch for any worsening  3. Immunocompromised state (HCC) Expect longer healing times for illness/allergy rxn  Plan for pt foot/skin changes s/p bite/inflammation 2 weeks ago:   Try applying cortisone 10 to the area as long as the skin is intact, you can also apply vaseline - this will help with the healing of the top layer of skin that may get dry or peel - this is normal and ok as long as its not hurting you or having fluid drain. You can apply ice packs and elevate the foot to help with the swelling (will tend to last longer and be worse just because of gravity, area of anatomy)  Follow up if any spreading rash and worsening swelling/redness/pain  We would need to recheck you At this time I do not think more steroids or antibiotics are indicated.  Looks most like a continued local allergic reaction   Add zyrtec to your other meds for about 1 to 2 weeks   Reviewed plan and f/up with pt who verbalized understanding, all questions answered    Danelle Berry, PA-C 05/21/23 2:34 PM

## 2023-05-21 NOTE — Telephone Encounter (Signed)
Medication Refill - Medication:  Magnesium 500 MG CAPS   Has the patient contacted their pharmacy? Yes.   [facility] Rx to [place] and did not send prescriber information.  Pt gets her scripts in pill paks.  In order for the pharmacy to fill this for her, they need a new Rx from Dr Carlynn Purl w/ her prescriber info.  Then they can dispense in her pill paks.  Preferred Pharmacy (with phone number or street name): [place] [facility]  Has the patient been seen for an appointment in the last year OR does the patient have an upcoming appointment? Yes.    Agent: Please be advised that RX refills may take up to 3 business days. We ask that you follow-up with your pharmacy.

## 2023-05-22 ENCOUNTER — Ambulatory Visit
Admission: RE | Admit: 2023-05-22 | Discharge: 2023-05-22 | Disposition: A | Discharge status: Home / Self Care | Payer: Medicare PPO | Source: Ambulatory Visit | Attending: Radiation Oncology | Admitting: Radiation Oncology

## 2023-05-22 ENCOUNTER — Other Ambulatory Visit (HOSPITAL_COMMUNITY): Payer: Self-pay

## 2023-05-22 ENCOUNTER — Other Ambulatory Visit: Payer: Self-pay

## 2023-05-22 DIAGNOSIS — Z510 Encounter for antineoplastic radiation therapy: Secondary | ICD-10-CM | POA: Diagnosis not present

## 2023-05-22 DIAGNOSIS — C9110 Chronic lymphocytic leukemia of B-cell type not having achieved remission: Secondary | ICD-10-CM | POA: Diagnosis not present

## 2023-05-22 LAB — RAD ONC ARIA SESSION SUMMARY
Course Elapsed Days: 1
Plan Fractions Treated to Date: 2
Plan Prescribed Dose Per Fraction: 2 Gy
Plan Total Fractions Prescribed: 2
Plan Total Prescribed Dose: 4 Gy
Reference Point Dosage Given to Date: 4 Gy
Reference Point Session Dosage Given: 2 Gy
Session Number: 2

## 2023-05-22 NOTE — Telephone Encounter (Signed)
Requested medications are due for refill today.  See note  Requested medications are on the active medications list.  yes  Last refill. 03/19/2023 #[id number]  Future visit scheduled.   no  Notes to clinic.  Has the patient contacted their pharmacy? Yes.   [facility] transferred this Rx to [place] and did not send prescriber information.  Pt gets her scripts in pill paks.  In order for the pharmacy to fill this for her, they need a new Rx from Dr Carlynn Purl w/ her prescriber info.  Then they can dispense in her pill paks.   Requested Prescriptions  Pending Prescriptions Disp Refills   Magnesium 500 MG CAPS      Sig: Take 1 capsule (500 mg total) by mouth in the morning.     Endocrinology:  Minerals - Magnesium Supplementation Failed - 05/21/2023 11:00 AM      Failed - Mg Level in normal range and within 360 days    Magnesium  Date Value Ref Range Status  09/28/2020 1.9 1.7 - 2.4 mg/dL Final    Comment:    Performed at [facility], [address] [place], [place] [address]         Failed - Cr in normal range and within 360 days    Creat  Date Value Ref Range Status  12/06/2020 0.96 0.50 - 1.05 mg/dL Final    Comment:    For patients >62 years of age, the reference limit for Creatinine is approximately 13% higher for people identified as African-American. .    Creatinine, Ser  Date Value Ref Range Status  05/07/2023 1.26 (H) 0.44 - 1.00 mg/dL Final         Passed - Valid encounter within last 12 months    Recent Outpatient Visits           Yesterday    [facility] Danelle Berry, PA-C   1 month ago CLL (chronic lymphocytic leukemia) [facility])   [facility] Alba Cory, MD   2 months ago Cough in adult   [facility] Alba Cory, MD   5 months ago Vaginal yeast infection   [facility] Mecum, Oswaldo Conroy, PA-C   6 months ago Allergic reaction to drug, initial  encounter   [facility] Mecum, Oswaldo Conroy, [place]

## 2023-05-23 ENCOUNTER — Encounter: Payer: Self-pay | Admitting: Oncology

## 2023-05-23 ENCOUNTER — Other Ambulatory Visit (HOSPITAL_COMMUNITY): Payer: Self-pay

## 2023-05-23 ENCOUNTER — Other Ambulatory Visit: Payer: Self-pay | Admitting: Family Medicine

## 2023-05-23 ENCOUNTER — Other Ambulatory Visit: Payer: Self-pay

## 2023-05-23 MED ORDER — CALTRATE GUMMY BITES 250-10 MG-MCG PO CHEW
1.0000 | CHEWABLE_TABLET | Freq: Two times a day (BID) | ORAL | 3 refills | Status: DC
  Filled 2023-05-23 – 2023-05-24 (×2): qty 180, fill #0
  Filled 2023-05-24: qty 60, 30d supply, fill #0
  Filled 2023-05-27: qty 50, 25d supply, fill #0
  Filled 2023-06-17 (×2): qty 50, 25d supply, fill #1
  Filled 2023-07-18: qty 50, 25d supply, fill #2
  Filled 2023-08-08 – 2023-08-12 (×4): qty 50, 25d supply, fill #3
  Filled 2023-09-04 – 2023-09-09 (×3): qty 50, 25d supply, fill #4
  Filled 2023-09-22: qty 50, 25d supply, fill #5
  Filled 2023-10-02: qty 60, 30d supply, fill #5
  Filled 2023-10-03 (×2): qty 50, 25d supply, fill #5
  Filled 2023-10-24 – 2023-10-29 (×3): qty 50, 25d supply, fill #6
  Filled 2023-11-17 – 2023-11-26 (×3): qty 50, 25d supply, fill #7
  Filled 2023-12-04 – 2023-12-19 (×3): qty 50, 25d supply, fill #8
  Filled 2024-01-05 – 2024-01-16 (×3): qty 50, 25d supply, fill #9
  Filled 2024-02-17 – 2024-02-20 (×3): qty 50, 25d supply, fill #10
  Filled 2024-03-08 – 2024-03-09 (×2): qty 50, 25d supply, fill #11
  Filled 2024-04-05 – 2024-04-06 (×2): qty 50, 25d supply, fill #12
  Filled 2024-05-10 – 2024-05-11 (×2): qty 50, 25d supply, fill #13
  Filled ????-??-??: fill #5

## 2023-05-24 ENCOUNTER — Other Ambulatory Visit (HOSPITAL_COMMUNITY): Payer: Self-pay

## 2023-05-24 ENCOUNTER — Other Ambulatory Visit: Payer: Self-pay

## 2023-05-26 ENCOUNTER — Other Ambulatory Visit: Payer: Self-pay

## 2023-05-26 ENCOUNTER — Other Ambulatory Visit (HOSPITAL_BASED_OUTPATIENT_CLINIC_OR_DEPARTMENT_OTHER): Payer: Self-pay

## 2023-05-26 ENCOUNTER — Other Ambulatory Visit (HOSPITAL_COMMUNITY): Payer: Self-pay

## 2023-05-26 ENCOUNTER — Other Ambulatory Visit: Payer: Self-pay | Admitting: Family Medicine

## 2023-05-26 MED ORDER — MAGNESIUM 500 MG PO CAPS
500.0000 mg | ORAL_CAPSULE | Freq: Every day | ORAL | 99 refills | Status: DC
  Filled 2023-05-26: qty 90, 90d supply, fill #0
  Filled 2023-05-27: qty 30, 30d supply, fill #0
  Filled 2023-06-17: qty 30, 30d supply, fill #1
  Filled 2023-07-14: qty 30, 30d supply, fill #2
  Filled 2023-08-08 (×2): qty 30, 30d supply, fill #3

## 2023-05-26 MED ORDER — PREGABALIN 150 MG PO CAPS
150.0000 mg | ORAL_CAPSULE | Freq: Three times a day (TID) | ORAL | 1 refills | Status: DC
  Filled 2023-05-26 – 2023-05-27 (×2): qty 90, 30d supply, fill #0
  Filled 2023-07-30: qty 90, 30d supply, fill #1

## 2023-05-26 NOTE — Telephone Encounter (Addendum)
Medication Refill - Medication:  Magnesium 500 MG CAPS   Has the patient contacted their pharmacy? Yes.   I sent TE to NT last week to help w/ this refill Preferred Pharmacy (with phone number or street name): [place] [facility]  Has the patient been seen for an appointment in the last year OR does the patient have an upcoming appointment? Yes.    [facility] is calling today.

## 2023-05-27 ENCOUNTER — Encounter: Payer: Self-pay | Admitting: Oncology

## 2023-05-27 ENCOUNTER — Other Ambulatory Visit: Payer: Self-pay

## 2023-05-27 ENCOUNTER — Other Ambulatory Visit (HOSPITAL_COMMUNITY): Payer: Self-pay

## 2023-05-29 ENCOUNTER — Other Ambulatory Visit: Payer: Self-pay

## 2023-05-29 ENCOUNTER — Other Ambulatory Visit (HOSPITAL_COMMUNITY): Payer: Self-pay

## 2023-05-30 ENCOUNTER — Other Ambulatory Visit (HOSPITAL_COMMUNITY): Payer: Self-pay

## 2023-06-03 ENCOUNTER — Other Ambulatory Visit (HOSPITAL_COMMUNITY): Payer: Self-pay

## 2023-06-04 ENCOUNTER — Ambulatory Visit: Payer: Medicare PPO | Admitting: Oncology

## 2023-06-04 ENCOUNTER — Other Ambulatory Visit: Payer: Medicare PPO

## 2023-06-04 ENCOUNTER — Ambulatory Visit: Payer: Medicare PPO

## 2023-06-09 ENCOUNTER — Other Ambulatory Visit: Payer: Medicare PPO

## 2023-06-09 ENCOUNTER — Ambulatory Visit: Payer: Medicare PPO | Admitting: Oncology

## 2023-06-09 ENCOUNTER — Ambulatory Visit: Payer: Medicare PPO

## 2023-06-10 ENCOUNTER — Other Ambulatory Visit: Payer: Self-pay | Admitting: Oncology

## 2023-06-10 DIAGNOSIS — C9110 Chronic lymphocytic leukemia of B-cell type not having achieved remission: Secondary | ICD-10-CM

## 2023-06-11 ENCOUNTER — Encounter: Payer: Self-pay | Admitting: Oncology

## 2023-06-12 ENCOUNTER — Other Ambulatory Visit: Payer: Self-pay | Admitting: Family Medicine

## 2023-06-12 ENCOUNTER — Encounter: Payer: Self-pay | Admitting: Oncology

## 2023-06-12 ENCOUNTER — Other Ambulatory Visit: Payer: Self-pay

## 2023-06-12 ENCOUNTER — Other Ambulatory Visit (HOSPITAL_COMMUNITY): Payer: Self-pay

## 2023-06-12 DIAGNOSIS — L299 Pruritus, unspecified: Secondary | ICD-10-CM

## 2023-06-12 MED ORDER — CETIRIZINE HCL 10 MG PO TABS
10.0000 mg | ORAL_TABLET | Freq: Every day | ORAL | 0 refills | Status: DC
  Filled 2023-06-12 – 2023-08-08 (×3): qty 30, 30d supply, fill #0

## 2023-06-13 ENCOUNTER — Other Ambulatory Visit (HOSPITAL_COMMUNITY): Payer: Self-pay

## 2023-06-14 ENCOUNTER — Other Ambulatory Visit (HOSPITAL_COMMUNITY): Payer: Self-pay

## 2023-06-16 ENCOUNTER — Encounter: Payer: Self-pay | Admitting: Oncology

## 2023-06-16 ENCOUNTER — Other Ambulatory Visit: Payer: Self-pay

## 2023-06-16 ENCOUNTER — Other Ambulatory Visit (HOSPITAL_COMMUNITY): Payer: Self-pay

## 2023-06-17 ENCOUNTER — Encounter: Payer: Self-pay | Admitting: Oncology

## 2023-06-17 ENCOUNTER — Other Ambulatory Visit (HOSPITAL_COMMUNITY): Payer: Self-pay

## 2023-06-17 ENCOUNTER — Other Ambulatory Visit: Payer: Self-pay

## 2023-06-17 ENCOUNTER — Other Ambulatory Visit (HOSPITAL_BASED_OUTPATIENT_CLINIC_OR_DEPARTMENT_OTHER): Payer: Self-pay

## 2023-06-17 ENCOUNTER — Ambulatory Visit: Payer: Self-pay

## 2023-06-17 NOTE — Telephone Encounter (Signed)
  Chief Complaint: vaginitis  Symptoms: vaginal yellow discharge, pressure, and slight odor  Frequency: 2 weeks  Pertinent Negatives: NA Disposition: [] ED /[] Urgent Care (no appt availability in office) / [x] Appointment(In office/virtual)/ []  [place] [facility]/ [] Home Care/ [] Refused Recommended Disposition /[] [place] [facility]/ []  Follow-up with PCP Additional Notes: pt reporting above sx x 2 weeks. Had UTI and BV previously 2-3 months ago but completed abx. No appts until 06/23/23, called and spoke with Cassandra, FC and transferred her to pt since she was able to schedule an appt.   Reason for Disposition  [1] MILD-MODERATE pain AND [2] present > 24 hours  (Exception: Chronic pain.)  Answer Assessment - Initial Assessment Questions 1. SYMPTOM: "What's the main symptom you're concerned about?" (e.g., pain, itching, dryness)     Discharge  2. LOCATION: "Where is the  sx located?" (e.g., inside/outside, left/right)     Vaginally  3. ONSET: "When did the  sx  start?"     2 weeks  4. PAIN: "Is there any pain?" If Yes, ask: "How bad is it?" (Scale: 1-10; mild, moderate, severe)   -  MILD (1-3): Doesn't interfere with normal activities.    -  MODERATE (4-7): Interferes with normal activities (e.g., work or school) or awakens from sleep.     -  SEVERE (8-10): Excruciating pain, unable to do any normal activities.     Moderate  5. ITCHING: "Is there any itching?" If Yes, ask: "How bad is it?" (Scale: 1-10; mild, moderate, severe)     No  7. OTHER SYMPTOMS: "Do you have any other symptoms?" (e.g., fever, itching, vaginal bleeding, pain with urination, injury to genital area, vaginal foreign body)     Pressure and odor  Protocols used: Vaginal Symptoms-A-AH

## 2023-06-17 NOTE — Telephone Encounter (Signed)
Patient called, left VM to return the call to the office to speak to the NT.   Summary: vaginal pressure, discharge, and a and a slight odor going on for a few weeks.   Pt stated she is experiencing vaginal pressure, discharge, and a and a slight odor going on for a few weeks.  No appointments.  Seeking clinical advice.

## 2023-06-18 ENCOUNTER — Other Ambulatory Visit (HOSPITAL_COMMUNITY): Payer: Self-pay

## 2023-06-18 ENCOUNTER — Other Ambulatory Visit: Payer: Self-pay | Admitting: *Deleted

## 2023-06-18 ENCOUNTER — Encounter: Payer: Self-pay | Admitting: Oncology

## 2023-06-18 ENCOUNTER — Other Ambulatory Visit: Payer: Self-pay

## 2023-06-18 DIAGNOSIS — C9110 Chronic lymphocytic leukemia of B-cell type not having achieved remission: Secondary | ICD-10-CM

## 2023-06-18 NOTE — Progress Notes (Signed)
cb

## 2023-06-19 ENCOUNTER — Ambulatory Visit (INDEPENDENT_AMBULATORY_CARE_PROVIDER_SITE_OTHER): Payer: Medicare PPO | Admitting: Nurse Practitioner

## 2023-06-19 ENCOUNTER — Other Ambulatory Visit (HOSPITAL_COMMUNITY)
Admission: RE | Admit: 2023-06-19 | Discharge: 2023-06-19 | Disposition: A | Discharge status: Home / Self Care | Payer: Medicare PPO | Source: Ambulatory Visit

## 2023-06-19 ENCOUNTER — Inpatient Hospital Stay: Payer: Medicare PPO | Admitting: Pharmacist

## 2023-06-19 ENCOUNTER — Encounter: Payer: Self-pay | Admitting: Nurse Practitioner

## 2023-06-19 ENCOUNTER — Inpatient Hospital Stay: Payer: Medicare PPO | Attending: Oncology

## 2023-06-19 ENCOUNTER — Other Ambulatory Visit: Payer: Self-pay

## 2023-06-19 VITALS — BP 122/78 | HR 96 | Temp 97.9°F | Resp 16 | Ht 67.0 in | Wt 188.1 lb

## 2023-06-19 DIAGNOSIS — N898 Other specified noninflammatory disorders of vagina: Secondary | ICD-10-CM

## 2023-06-19 DIAGNOSIS — R350 Frequency of micturition: Secondary | ICD-10-CM | POA: Diagnosis not present

## 2023-06-19 DIAGNOSIS — C9110 Chronic lymphocytic leukemia of B-cell type not having achieved remission: Secondary | ICD-10-CM

## 2023-06-19 LAB — CMP (CANCER CENTER ONLY)
ALT: 21 U/L (ref 0–44)
AST: 18 U/L (ref 15–41)
Albumin: 3.9 g/dL (ref 3.5–5.0)
Alkaline Phosphatase: 70 U/L (ref 38–126)
Anion gap: 5 (ref 5–15)
BUN: 20 mg/dL (ref 6–20)
CO2: 23 mmol/L (ref 22–32)
Calcium: 8.5 mg/dL — ABNORMAL LOW (ref 8.9–10.3)
Chloride: 107 mmol/L (ref 98–111)
Creatinine: 1.35 mg/dL — ABNORMAL HIGH (ref 0.44–1.00)
GFR, Estimated: 47 mL/min — ABNORMAL LOW (ref >60)
Glucose, Bld: 115 mg/dL — ABNORMAL HIGH (ref 70–99)
Potassium: 3.9 mmol/L (ref 3.5–5.1)
Sodium: 135 mmol/L (ref 135–145)
Total Bilirubin: 0.8 mg/dL (ref 0.3–1.2)
Total Protein: 6.1 g/dL — ABNORMAL LOW (ref 6.5–8.1)

## 2023-06-19 LAB — CBC WITH DIFFERENTIAL/PLATELET
Abs Immature Granulocytes: 0.03 10*3/uL (ref 0.00–0.07)
Basophils Absolute: 0.1 10*3/uL (ref 0.0–0.1)
Basophils Relative: 2 %
Eosinophils Absolute: 0.1 10*3/uL (ref 0.0–0.5)
Eosinophils Relative: 5 %
HCT: 38.5 % (ref 36.0–46.0)
Hemoglobin: 13.3 g/dL (ref 12.0–15.0)
Immature Granulocytes: 1 %
Lymphocytes Relative: 42 %
Lymphs Abs: 1.2 10*3/uL (ref 0.7–4.0)
MCH: 35.6 pg — ABNORMAL HIGH (ref 26.0–34.0)
MCHC: 34.5 g/dL (ref 30.0–36.0)
MCV: 102.9 fL — ABNORMAL HIGH (ref 80.0–100.0)
Monocytes Absolute: 0.3 10*3/uL (ref 0.1–1.0)
Monocytes Relative: 12 %
Neutro Abs: 1 10*3/uL — ABNORMAL LOW (ref 1.7–7.7)
Neutrophils Relative %: 38 %
Platelets: 82 10*3/uL — ABNORMAL LOW (ref 150–400)
RBC: 3.74 MIL/uL — ABNORMAL LOW (ref 3.87–5.11)
RDW: 13.2 % (ref 11.5–15.5)
Smear Review: NORMAL
WBC: 2.8 10*3/uL — ABNORMAL LOW (ref 4.0–10.5)
nRBC: 0 % (ref 0.0–0.2)

## 2023-06-19 LAB — POCT URINALYSIS DIPSTICK
Bilirubin, UA: NEGATIVE
Glucose, UA: NEGATIVE
Ketones, UA: NEGATIVE
Nitrite, UA: NEGATIVE
Protein, UA: POSITIVE — AB
Spec Grav, UA: 1.02 (ref 1.010–1.025)
Urobilinogen, UA: 0.2 U/dL
pH, UA: 6 (ref 5.0–8.0)

## 2023-06-19 MED ORDER — NITROFURANTOIN MONOHYD MACRO 100 MG PO CAPS
100.0000 mg | ORAL_CAPSULE | Freq: Two times a day (BID) | ORAL | 0 refills | Status: DC
Start: 2023-06-19 — End: 2023-08-27

## 2023-06-19 NOTE — Progress Notes (Signed)
Oral Chemotherapy Clinic [facility]  Telephone:([phone] [phone]  Patient Care Team: Alba Cory, MD as PCP - General (Family Medicine) Jeralyn Ruths, MD as Consulting Physician (Oncology) Zettie Pho, [facility] (Inactive) as Pharmacist (Pharmacist) Gustavus Bryant, LCSW as [facility] Care Management (Licensed Clinical Social Worker) Keitha Butte, RN as Registered Nurse (Oncology) Borders, Daryl Eastern, NP as Nurse Practitioner [facility] and Palliative Medicine)   Name of the patient: Monique [name]  [id number]  1971/03/18   Date of visit: 06/19/2023  HPI: Patient is a 52 y.o. female with  progressive CLL, previously treated with ibrutinib. Patient was intolerant to venetoclax treatment initiation and did not want to retry initiating venetoclax. The plan is to now treat her with rituximab and lenalidomide. Patient started rituximab on 02/28/22 and lenalidomide 03/01/22.  On 04/03/22, patient presented to clinic with a rash. Her lenalidomide was held and she was treated with prednisone (1 week) and loratadine continuosly. Rash improved by 04/10/22 f/u appt and she was restarted on lenalidomide.  On 04/28/22, patient called to report her rash had worsened and she stopped the lenalidomide on 04/23/22. She was stated on another prednisone taper. She was to taper down to 5mg  daily, then resume her lenalidomide. She resumed her lenalidomide on 05/16/22. Patient stopped rituximab after receiving 16 cycles, last dose 05/07/23.  Reason for Consult: Oral chemotherapy follow-up for lenalidomide therapy.   PAST MEDICAL HISTORY: Past Medical History:  Diagnosis Date   Anxiety    Bursitis    leg pain   Carpal tunnel syndrome    Cervical dysplasia    hx LEEP over 18 years ago.    Chronic lymphocytic leukemia (HCC) 2018   Dr Orlie Dakin.  (in lymph nodes)   COPD (chronic obstructive pulmonary disease) (HCC)    COVID-19 2021   Depression    Eating  disorder    GERD (gastroesophageal reflux disease)    History of self-harm    Insomnia    Obsession    Tobacco abuse    Vitamin B12 deficiency (non anemic)     HEMATOLOGY/ONCOLOGY HISTORY:  Oncology History Overview Note  Patient with recent CLL diagnosis by flow cytometry.  Previously not being treated in observation.   S/p 4 cycles of weekly Rituxan completing on 02/20/2017 scans from January 2018 revealed minimal progression.   Developed right breast mass in March 2019.  Had diagnostic mammogram performed on 12/26/2017 showed suspicious right breast mass.  Biopsy revealed granulomatous mastitis.  She was treated with doxycycline 100 mg twice daily for 7 days.   Developed worsening adenopathy.  CT chest/abdomen/pelvis and soft tissue neck from May 2019 revealed progressive bulky adenopathy in the chest, abdomen and pelvis compatible with worsening disease.   Dr. Orlie Dakin recommended beginning treatment with Rituxan and Treanda.  Received first cycle last week.  S/p 6 cycles of Rituxan and Treanda.  Tolerating well.  Last dose given 03/25/18.   CLL (chronic lymphocytic leukemia) (HCC)  11/24/2016 Initial Diagnosis   CLL (chronic lymphocytic leukemia) (HCC)   01/21/2018 - 03/26/2018 Chemotherapy   The patient had palonosetron (ALOXI) injection 0.25 mg, 0.25 mg, Intravenous,  Once, 4 of 5 cycles Administration: 0.25 mg (01/28/2018), 0.25 mg (02/25/2018), 0.25 mg (03/25/2018) riTUXimab (RITUXAN) 700 mg in sodium chloride 0.9 % 250 mL (2.1875 mg/mL) infusion, 375 mg/m2 = 700 mg, Intravenous,  Once, 4 of 5 cycles Administration: 700 mg (01/28/2018) bendamustine (BENDEKA) 150 mg in sodium chloride 0.9 % 50 mL (2.6786 mg/mL) chemo infusion,  90 mg/m2 = 150 mg, Intravenous,  Once, 4 of 5 cycles Administration: 150 mg (01/28/2018), 150 mg (01/29/2018), 150 mg (02/25/2018), 150 mg (02/26/2018), 150 mg (03/25/2018), 150 mg (03/26/2018)  for chemotherapy treatment.    02/28/2022 - 04/18/2022 Chemotherapy    Patient is on Treatment Plan : NON-HODGKINS LYMPHOMA Rituximab q7d     02/28/2022 -  Chemotherapy   Patient is on Treatment Plan : NON-HODGKINS LYMPHOMA Rituximab q28d     03/12/2022 - 03/12/2022 Chemotherapy   Patient is on Treatment Plan : LYMPHOMA CLL/SLL Venetoclax ramp up / Venetoclax + Rituximab (375/500) q28d x 6 cycles / Venetoclax q28d x 18 cycles       ALLERGIES:  is allergic to doxycycline and duloxetine.  MEDICATIONS:  Current Outpatient Medications  Medication Sig Dispense Refill   albuterol (VENTOLIN HFA) 108 (90 Base) MCG/ACT inhaler Inhale 2 puffs into the lungs every 6 (six) hours as needed for wheezing or shortness of breath. 8 g 0   ARIPiprazole (ABILIFY) 5 MG tablet Take 1 tablet (5 mg total) by mouth daily. 90 tablet 1   Ascorbic Acid (VITAMIN C) 1000 MG tablet Take 1,000 mg by mouth daily as needed (immune health).     ASPIRIN 81 PO Take 81 mg by mouth daily.     atorvastatin (LIPITOR) 10 MG tablet Take 1 tablet (10 mg total) by mouth in the morning. 90 tablet 1   atorvastatin (LIPITOR) 10 MG tablet Take 1 tablet (10 mg total) by mouth at bedtime. 90 tablet 1   Ca Phosphate-Cholecalciferol (CALTRATE GUMMY BITES) 250-10 MG-MCG CHEW Chew 1 gummy by mouth 2 (two) times daily. 180 tablet 3   cetirizine (ZYRTEC) 10 MG tablet Take 1 tablet (10 mg total) by mouth daily. 30 tablet 0   clobetasol cream (TEMOVATE) 0.05 % Apply topically to bites on lower legs as directed daily up to 5 days a week until clear then use as needed for flares 45 g 1   estrogens, conjugated, (PREMARIN) 0.625 MG tablet Take by mouth.     estrogens, conjugated, (PREMARIN) 0.625 MG tablet Take 1 tablet (0.625 mg total) by mouth daily for 21 days, then do not take for 7 days. 90 tablet 2   famotidine (PEPCID) 10 MG tablet Take by mouth.     Glycopyrrolate-Formoterol (BEVESPI AEROSPHERE) 9-4.8 MCG/ACT AERO Inhale 2 puffs into the lungs daily. 10.7 g 3   Glycopyrrolate-Formoterol (BEVESPI AEROSPHERE) 9-4.8  MCG/ACT AERO Inhale 2 puffs by mouth into the lungs once daily. 10.7 g 1   lenalidomide (REVLIMID) 10 MG capsule TAKE 1 CAPSULE (10MG  TOTAL) BY MOUTH DAILY 28 capsule 0   loratadine (CLARITIN) 10 MG tablet Take 1 tablet (10 mg total) by mouth every morning. 90 tablet 1   loratadine (CLARITIN) 10 MG tablet Take 1 tablet (10 mg total) by mouth daily in the morning. 90 tablet 1   Magnesium 500 MG CAPS Take 1 capsule (500 mg total) by mouth daily. 90 capsule PRN   meloxicam (MOBIC) 15 MG tablet Take 1 tablet (15 mg total) by mouth daily as needed for pain. 90 tablet 1   meloxicam (MOBIC) 15 MG tablet Take 1 tablet (15 mg total) by mouth every morning. 150 tablet 0   omeprazole (PRILOSEC) 20 MG capsule Take 1 capsule (20 mg total) by mouth daily in the morning. 150 capsule 0   pregabalin (LYRICA) 100 MG capsule Take 100 mg by mouth 3 (three) times daily.     pregabalin (LYRICA) 150 MG capsule Take  1 capsule (150 mg total) by mouth 3 (three) times daily. 90 capsule 1   PREMARIN 0.625 MG tablet TAKE ONE TABLET BY MOUTH ONCE daily FOR 21 DAYS THEN DO not take FOR 7 days 90 tablet 3   tiZANidine (ZANAFLEX) 2 MG tablet Take 1 tablet (2 mg total) by mouth at bedtime. 90 tablet 1   umeclidinium-vilanterol (ANORO ELLIPTA) 62.5-25 MCG/ACT AEPB Inhale 1 puff into the lungs daily. 60 each 5   vitamin B-12 (CYANOCOBALAMIN) 500 MCG tablet Take 250 mcg by mouth every morning.     No current facility-administered medications for this visit.    VITAL SIGNS: LMP 08/17/2016  There were no vitals filed for this visit.  Estimated body mass index is 28.96 kg/m as calculated from the following:   Height as of 05/21/23: 5\' 7"  (1.702 m).   Weight as of 05/21/23: 83.9 kg (184 lb 14.4 oz).  LABS: CBC:    Component Value Date/Time   WBC 2.8 (L) 06/19/2023 1001   HGB 13.3 06/19/2023 1001   HCT 38.5 06/19/2023 1001   PLT 82 (L) 06/19/2023 1001   MCV 102.9 (H) 06/19/2023 1001   NEUTROABS 1.0 (L) 06/19/2023 1001    LYMPHSABS 1.2 06/19/2023 1001   MONOABS 0.3 06/19/2023 1001   EOSABS 0.1 06/19/2023 1001   BASOSABS 0.1 06/19/2023 1001   Comprehensive Metabolic Panel:    Component Value Date/Time   NA 135 06/19/2023 1001   NA 138 05/13/2018 1413   K 3.9 06/19/2023 1001   CL 107 06/19/2023 1001   CO2 23 06/19/2023 1001   BUN 20 06/19/2023 1001   BUN 15 05/13/2018 1413   CREATININE 1.35 (H) 06/19/2023 1001   CREATININE 0.96 12/06/2020 1646   GLUCOSE 115 (H) 06/19/2023 1001   CALCIUM 8.5 (L) 06/19/2023 1001   AST 18 06/19/2023 1001   ALT 21 06/19/2023 1001   ALKPHOS 70 06/19/2023 1001   BILITOT 0.8 06/19/2023 1001   PROT 6.1 (L) 06/19/2023 1001   PROT 6.3 05/13/2018 1413   ALBUMIN 3.9 06/19/2023 1001   ALBUMIN 4.1 05/13/2018 1413     Present during today's visit: patient only  Assessment and Plan: CBC/CMP reviewed, continue lenalidomide 10mg  daily Patient reported palpating a nodule on the left side of her neck, nodule felt during today's visit, approximately the size of a pea. Patient also reported night sweats, but also sweats during the daytime too. MD informed of the patient's above, Patient offered to move up her already scheduled repeat imagining, she would like to keep things as scheduled. She knows to call the office if she feels the nodule is increasing in size   Oral Chemotherapy Side Effect/Intolerance:  Currently no reported lenalidomide related side effects  Oral Chemotherapy Adherence: No missed doses reported  New medications: None reported.  Medication Access Issues: None reported.  Patient expressed understanding and was in agreement with this plan. She also understands that She can call clinic at any time with any questions, concerns, or complaints.   Follow-up plan: RTC as scheduled  Thank you for allowing me to participate in the care of this very pleasant patient.   Time Total: 15 minutes  Visit consisted of counseling and education on dealing with issues of  symptom management in the setting of serious and potentially life-threatening illness.Greater than 50%  of this time was spent counseling and coordinating care related to the above assessment and plan.  Signed by: [name], PharmD, BCPS, BCOP, CPP Hematology/Oncology Clinical Pharmacist Practitioner [place]/DB/AP  Oral Chemotherapy Navigation Clinic [phone]  06/19/2023 12:07 PM

## 2023-06-19 NOTE — Progress Notes (Signed)
BP 122/78   Pulse 96   Temp 97.9 F (36.6 C) (Oral)   Resp 16   Ht 5\' 7"  (1.702 m)   Wt 188 lb 1.6 oz (85.3 kg)   LMP 08/17/2016   SpO2 98%   BMI 29.46 kg/m    Subjective:    Patient ID: Monique [name], female    DOB: Sep 01, 1971, 52 y.o.   MRN: [id number]  HPI: Monique [name] is a 52 y.o. female  Chief Complaint  Patient presents with   Vaginal Discharge   Urinary Frequency   Urinary frequency/ vaginal discharge: patient reports symptoms started 2 weeks ago. She denies any fever or back pain. She reports urinary frequency, dysuria and vaginal discharge. She also reports pressure.  She reports that the discharge looks yellow. She denies any clumps. Will obtain vaginal swab and urine dip. Urine dip positive for blood, protein and leukocytes.  Start macrobid, urine sent for culture.   Relevant past medical, surgical, family and social history reviewed and updated as indicated. Interim medical history since our last visit reviewed. Allergies and medications reviewed and updated.  Review of Systems  Ten systems reviewed and is negative except as mentioned in HPI       Objective:    BP 122/78   Pulse 96   Temp 97.9 F (36.6 C) (Oral)   Resp 16   Ht 5\' 7"  (1.702 m)   Wt 188 lb 1.6 oz (85.3 kg)   LMP 08/17/2016   SpO2 98%   BMI 29.46 kg/m   Wt Readings from Last 3 Encounters:  06/19/23 188 lb 1.6 oz (85.3 kg)  05/21/23 184 lb 14.4 oz (83.9 kg)  05/12/23 185 lb (83.9 kg)    Physical Exam  Constitutional: Patient appears well-developed and well-nourished.  No distress.  HEENT: head atraumatic, normocephalic, pupils equal and reactive to light, neck supple, throat within normal limits Cardiovascular: Normal rate, regular rhythm and normal heart sounds.  No murmur heard. No BLE edema. Pulmonary/Chest: Effort normal and breath sounds normal. No respiratory distress. Abdominal: Soft.  There is no tenderness. No CVA tenderness Psychiatric: Patient has a normal mood  and affect. behavior is normal. Judgment and thought content normal.  Results for orders placed or performed in visit on 06/19/23  POCT urinalysis dipstick  Result Value Ref Range   Color, UA gold    Clarity, UA cloudy    Glucose, UA Negative Negative   Bilirubin, UA negative    Ketones, UA negative    Spec Grav, UA 1.020 1.010 - 1.025   Blood, UA small    pH, UA 6.0 5.0 - 8.0   Protein, UA Positive (A) Negative   Urobilinogen, UA 0.2 0.2 or 1.0 E.U./dL   Nitrite, UA negative    Leukocytes, UA Large (3+) (A) Negative   Appearance cloudy    Odor none    *Note: Due to a large number of results and/or encounters for the requested time period, some results have not been displayed. A complete set of results can be found in Results Review.      Assessment & Plan:   Problem List Items Addressed This Visit   None Visit Diagnoses     Vaginal discharge    -  Primary   Will obtain vaginal swab and urine dip. Urine dip positive for blood, protein and leukocytes.  Start macrobid, urine sent for culture.   Relevant Orders   POCT urinalysis dipstick (Completed)   Cervicovaginal ancillary only  Urine Culture   Urinary frequency       Will obtain vaginal swab and urine dip. Urine dip positive for blood, protein and leukocytes.  Start macrobid, urine sent for culture. push fluids   Relevant Medications   nitrofurantoin, macrocrystal-monohydrate, (MACROBID) 100 MG capsule   Other Relevant Orders   POCT urinalysis dipstick (Completed)   Cervicovaginal ancillary only   Urine Culture        Follow up plan: Return if symptoms worsen or fail to improve.

## 2023-06-21 LAB — URINE CULTURE
MICRO NUMBER:: 15549307
SPECIMEN QUALITY:: ADEQUATE

## 2023-06-23 ENCOUNTER — Other Ambulatory Visit: Payer: Self-pay | Admitting: Nurse Practitioner

## 2023-06-23 DIAGNOSIS — N3000 Acute cystitis without hematuria: Secondary | ICD-10-CM

## 2023-06-23 LAB — CERVICOVAGINAL ANCILLARY ONLY
Bacterial Vaginitis (gardnerella): NEGATIVE
Candida Glabrata: NEGATIVE
Candida Vaginitis: NEGATIVE
Chlamydia: NEGATIVE
Comment: NEGATIVE
Comment: NEGATIVE
Comment: NEGATIVE
Comment: NEGATIVE
Comment: NEGATIVE
Comment: NORMAL
Neisseria Gonorrhea: NEGATIVE
Trichomonas: NEGATIVE

## 2023-06-23 MED ORDER — CEPHALEXIN 500 MG PO CAPS: 500.0000 mg | ORAL_CAPSULE | Freq: Two times a day (BID) | ORAL | 0 refills | Status: AC

## 2023-06-26 ENCOUNTER — Encounter: Payer: Self-pay | Admitting: Radiation Oncology

## 2023-06-26 ENCOUNTER — Ambulatory Visit
Admission: RE | Admit: 2023-06-26 | Discharge: 2023-06-26 | Disposition: A | Discharge status: Home / Self Care | Payer: Medicare PPO | Source: Ambulatory Visit | Attending: Radiation Oncology | Admitting: Radiation Oncology

## 2023-06-26 VITALS — BP 89/56 | HR 71 | Temp 96.5°F | Wt 189.7 lb

## 2023-06-26 DIAGNOSIS — C9110 Chronic lymphocytic leukemia of B-cell type not having achieved remission: Secondary | ICD-10-CM | POA: Insufficient documentation

## 2023-06-26 NOTE — Progress Notes (Signed)
Radiation Oncology Follow up Note  Name: Monique [name]   Date:   06/26/2023 MRN:  [id number] DOB: [birth year]    This 52 y.o. female presents to the clinic today for 1 month follow-up status post palliative radiation therapy low-dose to her left axilla for involvement of CLL.  REFERRING PROVIDER: Alba Cory, MD  HPI: Patient is a 52 year old female now out 1 month having completed low-dose palliative radiation therapy to her left axilla for painful adenopathy secondary to CLL.  Seen today in follow-up she states she still is sore under both arms bilaterally.  Pain may have improved with no shooting pains at this time.  She is having no other side effects..  COMPLICATIONS OF TREATMENT: none  FOLLOW UP COMPLIANCE: keeps appointments   PHYSICAL EXAM:  BP (!) 89/56 (BP Location: Left Arm, Patient Position: Sitting, Cuff Size: Normal)   Pulse 71   Temp (!) 96.5 F (35.8 C) (Tympanic)   Wt 189 lb 11.2 oz (86 kg)   LMP 08/17/2016   BMI 29.71 kg/m  No evidence of adenopathy is noted in the left axilla.  RADIOLOGY RESULTS: PET CT scheduled for next month.  PLAN: At this time I will see her back in 2 months after her PET scan has been performed.  Will review that at that time.  Since we use only very low-dose radiation therapy can always reuse it although at this time I want to see the results of the PET scan.  Patient comprehends my recommendations well.  I would like to take this opportunity to thank you for allowing me to participate in the care of your patient.[name], MD

## 2023-07-09 ENCOUNTER — Other Ambulatory Visit: Payer: Self-pay | Admitting: Oncology

## 2023-07-09 DIAGNOSIS — C9110 Chronic lymphocytic leukemia of B-cell type not having achieved remission: Secondary | ICD-10-CM

## 2023-07-10 ENCOUNTER — Encounter: Payer: Self-pay | Admitting: Oncology

## 2023-07-14 ENCOUNTER — Encounter: Payer: Self-pay | Admitting: Oncology

## 2023-07-14 ENCOUNTER — Other Ambulatory Visit: Payer: Self-pay

## 2023-07-15 ENCOUNTER — Other Ambulatory Visit (HOSPITAL_COMMUNITY): Payer: Self-pay

## 2023-07-15 ENCOUNTER — Other Ambulatory Visit: Payer: Self-pay | Admitting: Family Medicine

## 2023-07-15 DIAGNOSIS — I700 Atherosclerosis of aorta: Secondary | ICD-10-CM

## 2023-07-15 DIAGNOSIS — G8929 Other chronic pain: Secondary | ICD-10-CM

## 2023-07-15 NOTE — Telephone Encounter (Unsigned)
Copied from CRM [id number]. Topic: General - Other >> Jul 15, 2023 11:38 AM Everette C wrote: Reason for CRM: Medication Refill - Medication: atorvastatin (LIPITOR) 10 MG tablet [[id number]]  meloxicam (MOBIC) 15 MG tablet [[id number]]  Has the patient contacted their pharmacy? Yes.   (Agent: If no, request that the patient contact the pharmacy for the refill. If patient does not wish to contact the pharmacy document the reason why and proceed with request.) (Agent: If yes, when and what did the pharmacy advise?)  Preferred Pharmacy (with phone number or street name): [place] [facility] [address] [place] [address] Phone: [phone] Fax: [phone] Hours: Mon-Fri 7:30am-6pm; Sat 8:00am-4:30pm   Has the patient been seen for an appointment in the last year OR does the patient have an upcoming appointment? Yes.    Agent: Please be advised that RX refills may take up to 3 business days. We ask that you follow-up with your pharmacy.

## 2023-07-16 ENCOUNTER — Other Ambulatory Visit (HOSPITAL_COMMUNITY): Payer: Self-pay

## 2023-07-16 ENCOUNTER — Other Ambulatory Visit: Payer: Self-pay

## 2023-07-16 MED ORDER — MELOXICAM 15 MG PO TABS
15.0000 mg | ORAL_TABLET | Freq: Every day | ORAL | 0 refills | Status: DC | PRN
  Filled 2023-07-16: qty 30, 30d supply, fill #0
  Filled 2023-07-16: qty 90, 90d supply, fill #0
  Filled 2023-08-11 (×2): qty 30, 30d supply, fill #1
  Filled 2023-09-02 – 2023-09-05 (×4): qty 30, 30d supply, fill #2

## 2023-07-16 MED ORDER — ATORVASTATIN CALCIUM 10 MG PO TABS
10.0000 mg | ORAL_TABLET | Freq: Every morning | ORAL | 0 refills | Status: DC
  Filled 2023-07-16: qty 30, 30d supply, fill #0
  Filled 2023-07-16: qty 90, 90d supply, fill #0
  Filled 2023-08-08 (×2): qty 30, 30d supply, fill #1
  Filled 2023-09-02 – 2023-09-04 (×3): qty 30, 30d supply, fill #2

## 2023-07-16 NOTE — Telephone Encounter (Signed)
Requested Prescriptions  Pending Prescriptions Disp Refills   atorvastatin (LIPITOR) 10 MG tablet 90 tablet 1    Sig: Take 1 tablet (10 mg total) by mouth in the morning.     Cardiovascular:  Antilipid - Statins Failed - 07/15/2023  1:32 PM      Failed - Lipid Panel in normal range within the last 12 months    Cholesterol  Date Value Ref Range Status  10/15/2022 130 <200 mg/dL Final   LDL Cholesterol (Calc)  Date Value Ref Range Status  10/15/2022 53 mg/dL (calc) Final    Comment:    Reference range: <100 . Desirable range <100 mg/dL for primary prevention;   <70 mg/dL for patients with CHD or diabetic patients  with > or = 2 CHD risk factors. Marland Kitchen LDL-C is now calculated using the Martin-Hopkins  calculation, which is a validated novel method providing  better accuracy than the Friedewald equation in the  estimation of LDL-C.  Horald Pollen et al. Lenox Ahr. 7425;956(38): 2061-2068  (http://education.[facility].com/faq/FAQ164)    HDL  Date Value Ref Range Status  10/15/2022 61 > OR = 50 mg/dL Final   Triglycerides  Date Value Ref Range Status  10/15/2022 79 <150 mg/dL Final         Passed - Patient is not pregnant      Passed - Valid encounter within last 12 months    Recent Outpatient Visits           3 weeks ago Vaginal discharge   [facility] Berniece Salines, FNP   1 month ago Pruritic condition   [facility] Danelle Berry, PA-C   2 months ago CLL (chronic lymphocytic leukemia) [facility])   [facility] Alba Cory, MD   4 months ago Cough in adult   [facility] Alba Cory, MD   7 months ago Vaginal yeast infection   [facility] Mecum, Erin E, PA-C               meloxicam (MOBIC) 15 MG tablet 90 tablet 1    Sig: Take 1 tablet (15 mg total) by mouth daily as needed for pain.     Analgesics:  COX2 Inhibitors Failed -  07/15/2023  1:32 PM      Failed - Manual Review: Labs are only required if the patient has taken medication for more than 8 weeks.      Failed - Cr in normal range and within 360 days    Creatinine  Date Value Ref Range Status  06/19/2023 1.35 (H) 0.44 - 1.00 mg/dL Final   Creat  Date Value Ref Range Status  12/06/2020 0.96 0.50 - 1.05 mg/dL Final    Comment:    For patients >37 years of age, the reference limit for Creatinine is approximately 13% higher for people identified as African-American. .          Passed - HGB in normal range and within 360 days    Hemoglobin  Date Value Ref Range Status  06/19/2023 13.3 12.0 - 15.0 g/dL Final         Passed - HCT in normal range and within 360 days    HCT  Date Value Ref Range Status  06/19/2023 38.5 36.0 - 46.0 % Final         Passed - AST in normal range and within 360 days    AST  Date Value Ref Range Status  06/19/2023 18 15 - 41 U/L Final         Passed - ALT in normal range and within 360 days    ALT  Date Value Ref Range Status  06/19/2023 21 0 - 44 U/L Final         Passed - eGFR is 30 or above and within 360 days    GFR, Est African American  Date Value Ref Range Status  12/06/2020 79 > OR = 60 mL/min/1.88m2 Final   GFR, Est Non African American  Date Value Ref Range Status  12/06/2020 68 > OR = 60 mL/min/1.64m2 Final   GFR, Estimated  Date Value Ref Range Status  06/19/2023 47 (L) >60 mL/min Final    Comment:    (NOTE) Calculated using the CKD-EPI Creatinine Equation (2021)          Passed - Patient is not pregnant      Passed - Valid encounter within last 12 months    Recent Outpatient Visits           3 weeks ago Vaginal discharge   [facility] Berniece Salines, FNP   1 month ago Pruritic condition   [facility] Danelle Berry, PA-C   2 months ago CLL (chronic lymphocytic leukemia) [facility])   [facility] Alba Cory, MD   4 months ago Cough in adult   [facility] Alba Cory, MD   7 months ago Vaginal yeast infection   [facility] Mecum, Oswaldo [place], [place]

## 2023-07-18 ENCOUNTER — Encounter: Payer: Self-pay | Admitting: Oncology

## 2023-07-18 ENCOUNTER — Other Ambulatory Visit: Payer: Self-pay | Admitting: Family Medicine

## 2023-07-18 ENCOUNTER — Other Ambulatory Visit: Payer: Self-pay

## 2023-07-18 ENCOUNTER — Other Ambulatory Visit (HOSPITAL_COMMUNITY): Payer: Self-pay

## 2023-07-21 ENCOUNTER — Other Ambulatory Visit: Payer: Self-pay

## 2023-07-24 ENCOUNTER — Telehealth: Payer: Self-pay | Admitting: Family Medicine

## 2023-07-24 NOTE — Telephone Encounter (Signed)
Copied from CRM [id number]. Topic: General - Other >> Jul 24, 2023 11:35 AM Turkey B wrote: Reason for CRM: pt called states, inhaler,bevespi  is being discontinued so she needs and alternative to her back up of albuterol

## 2023-07-26 ENCOUNTER — Other Ambulatory Visit (HOSPITAL_COMMUNITY): Payer: Self-pay

## 2023-07-30 ENCOUNTER — Other Ambulatory Visit: Payer: Self-pay

## 2023-07-30 ENCOUNTER — Other Ambulatory Visit: Payer: Self-pay | Admitting: Family Medicine

## 2023-07-30 ENCOUNTER — Other Ambulatory Visit (HOSPITAL_COMMUNITY): Payer: Self-pay

## 2023-07-30 NOTE — Telephone Encounter (Signed)
Medication not covered by insurance, an alternative medication is needed, routing for approval.

## 2023-07-30 NOTE — Telephone Encounter (Signed)
Pt called reporting that bevespi (Glycopyrrolate-Formoterol (BEVESPI AEROSPHERE) 9-4.8 MCG/ACT AERO ) is not covered by her insurance and that she needs an alternative   [place] [facility]  [address] [place] [address]  Phone: [phone] Fax: [phone]   Pt is completely out and is requesting urgent assistance.

## 2023-08-03 ENCOUNTER — Other Ambulatory Visit: Payer: Self-pay | Admitting: Oncology

## 2023-08-03 DIAGNOSIS — C9110 Chronic lymphocytic leukemia of B-cell type not having achieved remission: Secondary | ICD-10-CM

## 2023-08-04 ENCOUNTER — Other Ambulatory Visit: Payer: Self-pay

## 2023-08-04 ENCOUNTER — Encounter: Payer: Self-pay | Admitting: Oncology

## 2023-08-05 ENCOUNTER — Other Ambulatory Visit: Payer: Self-pay

## 2023-08-05 ENCOUNTER — Other Ambulatory Visit (HOSPITAL_COMMUNITY): Payer: Self-pay

## 2023-08-06 ENCOUNTER — Ambulatory Visit
Admission: RE | Admit: 2023-08-06 | Discharge: 2023-08-06 | Disposition: A | Discharge status: Home / Self Care | Payer: Medicare PPO | Source: Ambulatory Visit | Attending: Oncology | Admitting: Oncology

## 2023-08-06 DIAGNOSIS — C9110 Chronic lymphocytic leukemia of B-cell type not having achieved remission: Secondary | ICD-10-CM | POA: Diagnosis not present

## 2023-08-06 DIAGNOSIS — R590 Localized enlarged lymph nodes: Secondary | ICD-10-CM | POA: Diagnosis not present

## 2023-08-06 DIAGNOSIS — R918 Other nonspecific abnormal finding of lung field: Secondary | ICD-10-CM | POA: Diagnosis not present

## 2023-08-06 LAB — GLUCOSE, CAPILLARY: Glucose-Capillary: 84 mg/dL (ref 70–99)

## 2023-08-06 MED ORDER — FLUDEOXYGLUCOSE F - 18 (FDG) INJECTION
9.8000 | Freq: Once | INTRAVENOUS | Status: AC | PRN
  Administered 2023-08-06: 10.22 via INTRAVENOUS

## 2023-08-08 ENCOUNTER — Other Ambulatory Visit (HOSPITAL_COMMUNITY): Payer: Self-pay

## 2023-08-08 ENCOUNTER — Other Ambulatory Visit: Payer: Self-pay

## 2023-08-08 ENCOUNTER — Encounter: Payer: Self-pay | Admitting: Oncology

## 2023-08-11 ENCOUNTER — Other Ambulatory Visit: Payer: Self-pay

## 2023-08-11 ENCOUNTER — Other Ambulatory Visit (HOSPITAL_COMMUNITY): Payer: Self-pay

## 2023-08-12 ENCOUNTER — Other Ambulatory Visit (HOSPITAL_COMMUNITY): Payer: Self-pay

## 2023-08-12 ENCOUNTER — Encounter: Payer: Self-pay | Admitting: Oncology

## 2023-08-12 ENCOUNTER — Other Ambulatory Visit: Payer: Self-pay

## 2023-08-13 ENCOUNTER — Ambulatory Visit: Payer: Medicare PPO | Admitting: Oncology

## 2023-08-13 ENCOUNTER — Other Ambulatory Visit: Payer: Medicare PPO

## 2023-08-18 ENCOUNTER — Encounter: Payer: Self-pay | Admitting: Oncology

## 2023-08-18 ENCOUNTER — Telehealth: Payer: Self-pay | Admitting: *Deleted

## 2023-08-18 ENCOUNTER — Inpatient Hospital Stay (HOSPITAL_BASED_OUTPATIENT_CLINIC_OR_DEPARTMENT_OTHER): Payer: Medicare PPO | Admitting: Oncology

## 2023-08-18 ENCOUNTER — Inpatient Hospital Stay: Payer: Medicare PPO | Attending: Oncology

## 2023-08-18 VITALS — BP 102/41 | HR 60 | Temp 96.6°F | Resp 16 | Ht 67.0 in | Wt 189.6 lb

## 2023-08-18 DIAGNOSIS — Z923 Personal history of irradiation: Secondary | ICD-10-CM | POA: Insufficient documentation

## 2023-08-18 DIAGNOSIS — F17210 Nicotine dependence, cigarettes, uncomplicated: Secondary | ICD-10-CM | POA: Insufficient documentation

## 2023-08-18 DIAGNOSIS — D696 Thrombocytopenia, unspecified: Secondary | ICD-10-CM | POA: Insufficient documentation

## 2023-08-18 DIAGNOSIS — G629 Polyneuropathy, unspecified: Secondary | ICD-10-CM | POA: Insufficient documentation

## 2023-08-18 DIAGNOSIS — C9110 Chronic lymphocytic leukemia of B-cell type not having achieved remission: Secondary | ICD-10-CM | POA: Diagnosis present

## 2023-08-18 DIAGNOSIS — Z79899 Other long term (current) drug therapy: Secondary | ICD-10-CM | POA: Insufficient documentation

## 2023-08-18 DIAGNOSIS — F32A Depression, unspecified: Secondary | ICD-10-CM | POA: Insufficient documentation

## 2023-08-18 DIAGNOSIS — F419 Anxiety disorder, unspecified: Secondary | ICD-10-CM | POA: Diagnosis not present

## 2023-08-18 LAB — CBC WITH DIFFERENTIAL/PLATELET
Abs Immature Granulocytes: 0.01 10*3/uL (ref 0.00–0.07)
Basophils Absolute: 0 10*3/uL (ref 0.0–0.1)
Basophils Relative: 2 %
Eosinophils Absolute: 0.1 10*3/uL (ref 0.0–0.5)
Eosinophils Relative: 4 %
HCT: 34.3 % — ABNORMAL LOW (ref 36.0–46.0)
Hemoglobin: 12.1 g/dL (ref 12.0–15.0)
Immature Granulocytes: 1 %
Lymphocytes Relative: 60 %
Lymphs Abs: 1.1 10*3/uL (ref 0.7–4.0)
MCH: 36.2 pg — ABNORMAL HIGH (ref 26.0–34.0)
MCHC: 35.3 g/dL (ref 30.0–36.0)
MCV: 102.7 fL — ABNORMAL HIGH (ref 80.0–100.0)
Monocytes Absolute: 0.2 10*3/uL (ref 0.1–1.0)
Monocytes Relative: 11 %
Neutro Abs: 0.4 10*3/uL — CL (ref 1.7–7.7)
Neutrophils Relative %: 22 %
Platelets: 64 10*3/uL — ABNORMAL LOW (ref 150–400)
RBC: 3.34 MIL/uL — ABNORMAL LOW (ref 3.87–5.11)
RDW: 12.5 % (ref 11.5–15.5)
WBC: 1.8 10*3/uL — ABNORMAL LOW (ref 4.0–10.5)
nRBC: 0 % (ref 0.0–0.2)

## 2023-08-18 LAB — COMPREHENSIVE METABOLIC PANEL
ALT: 370 U/L (ref 0–44)
AST: 313 U/L (ref 15–41)
Albumin: 3.5 g/dL (ref 3.5–5.0)
Alkaline Phosphatase: 107 U/L (ref 38–126)
Anion gap: 9 (ref 5–15)
BUN: 21 mg/dL — ABNORMAL HIGH (ref 6–20)
CO2: 23 mmol/L (ref 22–32)
Calcium: 8.3 mg/dL — ABNORMAL LOW (ref 8.9–10.3)
Chloride: 106 mmol/L (ref 98–111)
Creatinine, Ser: 1.13 mg/dL — ABNORMAL HIGH (ref 0.44–1.00)
GFR, Estimated: 58 mL/min — ABNORMAL LOW (ref >60)
Glucose, Bld: 86 mg/dL (ref 70–99)
Potassium: 4 mmol/L (ref 3.5–5.1)
Sodium: 138 mmol/L (ref 135–145)
Total Bilirubin: 1 mg/dL (ref <1.2)
Total Protein: 5.7 g/dL — ABNORMAL LOW (ref 6.5–8.1)

## 2023-08-18 NOTE — Telephone Encounter (Signed)
1035-critical anc of 0.5 reported by [facility] in cancer center lab. Read back process performed with lab tech. 1037-DrOrlie Dakin and his team provided with critical value - read back process performed with provider.

## 2023-08-18 NOTE — Progress Notes (Signed)
[facility]  [phone] [phone]  ID: Monique [name] OB: 1971-07-26  MR#: [id number]  YQM#:[id number]  Patient Care Team: Alba Cory, MD as PCP - General (Family Medicine) Jeralyn Ruths, MD as Consulting Physician (Oncology) Zettie Pho, [facility] (Inactive) as Pharmacist (Pharmacist) Gustavus Bryant, LCSW as [facility] Care Management (Licensed Clinical Social Worker) Keitha Butte, RN as Registered Nurse (Oncology) Borders, Daryl Eastern, NP as Nurse Practitioner (Hospice and Palliative Medicine)  CHIEF COMPLAINT: CLL.  INTERVAL HISTORY: Patient returns to clinic today for repeat laboratory work, further evaluation, and discussion of her PET scan results.  She continues to have bilateral axillary tenderness, but otherwise feels well.  She has now completed her XRT to her left axilla. She does not complain of any weakness or fatigue today. She continues to have a chronic peripheral neuropathy. She has no other neurologic complaints.  She has a good appetite.  She has no chest pain, shortness of breath, cough, or hemoptysis.  She denies any nausea, vomiting, or constipation.  She has no melena or hematochezia.  She has no urinary complaints.  Patient offers no further specific complaints today.  REVIEW OF SYSTEMS:   Review of Systems  Constitutional: Negative.  Negative for diaphoresis, fever, malaise/fatigue and weight loss.  HENT:  Negative for congestion.   Respiratory: Negative.  Negative for cough and shortness of breath.   Cardiovascular: Negative.  Negative for chest pain and leg swelling.  Gastrointestinal: Negative.  Negative for abdominal pain, constipation and diarrhea.  Genitourinary: Negative.  Negative for dysuria and frequency.  Musculoskeletal: Negative.  Negative for back pain, myalgias and neck pain.  Skin: Negative.  Negative for rash.  Neurological:  Positive for tingling and sensory change. Negative  for focal weakness and weakness.  Psychiatric/Behavioral: Negative.  Negative for depression and suicidal ideas. The patient is not nervous/anxious.     As per HPI. Otherwise, a complete review of systems is negative.  PAST MEDICAL HISTORY: Past Medical History:  Diagnosis Date   Anxiety    Bursitis    leg pain   Carpal tunnel syndrome    Cervical dysplasia    hx LEEP over 18 years ago.    Chronic lymphocytic leukemia (HCC) 2018   Dr Orlie Dakin.  (in lymph nodes)   COPD (chronic obstructive pulmonary disease) (HCC)    COVID-19 2021   Depression    Eating disorder    GERD (gastroesophageal reflux disease)    History of self-harm    Insomnia    Obsession    Tobacco abuse    Vitamin B12 deficiency (non anemic)     PAST SURGICAL HISTORY: Past Surgical History:  Procedure Laterality Date   BREAST BIOPSY Right 01/05/2018   US guided biopsy of 2 areas and 1 lymph node, MIXED INFLAMMATION AND GIANT CELL REACTION   CERVICAL BIOPSY  W/ LOOP ELECTRODE EXCISION     COLONOSCOPY WITH PROPOFOL N/A 04/21/2019   Procedure: COLONOSCOPY WITH PROPOFOL;  Surgeon: Pasty Spillers, MD;  Location: [facility] ENDOSCOPY;  Service: Gastroenterology;  Laterality: N/A;   OTHER SURGICAL HISTORY     scar tissue removed from vocal cords   TUBAL LIGATION     VAGINAL HYSTERECTOMY N/A 01/22/2021   Procedure: HYSTERECTOMY VAGINAL; BILATERAL SALPINGECTOMY;  Surgeon: Hildred Laser, MD;  Location: [facility] ORS;  Service: Gynecology;  Laterality: N/A;   vocal cord surgery  2005    FAMILY HISTORY: Family History  Problem Relation Age of Onset   Depression  Mother    Cancer Mother        thyroid   Alcohol abuse Father    COPD Father    Alcohol abuse Brother    Depression Brother    Bipolar disorder Brother    Suicidality Brother    ADD / ADHD Son    Breast cancer Neg Hx     ADVANCED DIRECTIVES (Y/N):  N  HEALTH MAINTENANCE: Social History   Tobacco Use   Smoking status: Every Day    Current packs/day:  1.00    Average packs/day: 1 pack/day for 36.9 years (36.9 ttl pk-yrs)    Types: Cigarettes    Start date: 09/25/1986   Smokeless tobacco: Never  Vaping Use   Vaping status: Former  Substance Use Topics   Alcohol use: Not Currently    Alcohol/week: 0.0 standard drinks of alcohol    Comment: rarely   Drug use: Yes    Frequency: 7.0 times per week    Types: Marijuana     Colonoscopy:  PAP:  Bone density:  Lipid panel:  Allergies  Allergen Reactions   Doxycycline Rash    Rash on cheeks and sides of nose    Duloxetine Rash    Current Outpatient Medications  Medication Sig Dispense Refill   albuterol (VENTOLIN HFA) 108 (90 Base) MCG/ACT inhaler Inhale 2 puffs into the lungs every 6 (six) hours as needed for wheezing or shortness of breath. 8 g 0   ARIPiprazole (ABILIFY) 5 MG tablet Take 1 tablet (5 mg total) by mouth daily. 90 tablet 1   Ascorbic Acid (VITAMIN C) 1000 MG tablet Take 1,000 mg by mouth daily as needed (immune health).     ASPIRIN 81 PO Take 81 mg by mouth daily.     atorvastatin (LIPITOR) 10 MG tablet Take 1 tablet (10 mg total) by mouth in the morning. 90 tablet 0   Ca Phosphate-Cholecalciferol (CALTRATE GUMMY BITES) 250-10 MG-MCG CHEW Chew 1 gummy by mouth 2 (two) times daily. 180 tablet 3   famotidine (PEPCID) 10 MG tablet Take by mouth.     Glycopyrrolate-Formoterol (BEVESPI AEROSPHERE) 9-4.8 MCG/ACT AERO Inhale 2 puffs into the lungs daily. 10.7 g 3   lenalidomide (REVLIMID) 10 MG capsule TAKE 1 CAPSULE BY MOUTH EVERY DAY 28 capsule 0   loratadine (CLARITIN) 10 MG tablet Take 1 tablet (10 mg total) by mouth daily in the morning. 90 tablet 1   Magnesium 500 MG CAPS Take 1 capsule (500 mg total) by mouth daily. 90 capsule PRN   meloxicam (MOBIC) 15 MG tablet Take 1 tablet (15 mg total) by mouth daily as needed for pain. 90 tablet 0   omeprazole (PRILOSEC) 20 MG capsule Take 1 capsule (20 mg total) by mouth daily in the morning. 150 capsule 0   pregabalin  (LYRICA) 150 MG capsule Take 1 capsule (150 mg total) by mouth 3 (three) times daily. 90 capsule 1   PREMARIN 0.625 MG tablet TAKE ONE TABLET BY MOUTH ONCE daily FOR 21 DAYS THEN DO not take FOR 7 days 90 tablet 3   tiZANidine (ZANAFLEX) 2 MG tablet Take 1 tablet (2 mg total) by mouth at bedtime. 90 tablet 1   vitamin B-12 (CYANOCOBALAMIN) 500 MCG tablet Take 250 mcg by mouth every morning.     cetirizine (ZYRTEC) 10 MG tablet Take 1 tablet (10 mg total) by mouth daily. (Patient not taking: Reported on 06/26/2023) 30 tablet 0   clobetasol cream (TEMOVATE) 0.05 % Apply topically to bites on  lower legs as directed daily up to 5 days a week until clear then use as needed for flares (Patient not taking: Reported on 06/26/2023) 45 g 1   nitrofurantoin, macrocrystal-monohydrate, (MACROBID) 100 MG capsule Take 1 capsule (100 mg total) by mouth 2 (two) times daily. (Patient not taking: Reported on 06/26/2023) 10 capsule 0   umeclidinium-vilanterol (ANORO ELLIPTA) 62.5-25 MCG/ACT AEPB Inhale 1 puff into the lungs daily. (Patient not taking: Reported on 06/26/2023) 60 each 5   No current facility-administered medications for this visit.    OBJECTIVE: Vitals:   08/18/23 1036  BP: (!) 102/41  Pulse: 60  Resp: 16  Temp: (!) 96.6 F (35.9 C)  SpO2: 100%      Body mass index is 29.7 kg/m.    ECOG FS:0 - Asymptomatic  General: Well-developed, well-nourished, no acute distress. Eyes: Pink conjunctiva, anicteric sclera. HEENT: Normocephalic, moist mucous membranes. Lungs: No audible wheezing or coughing. Heart: Regular rate and rhythm. Abdomen: Soft, nontender, no obvious distention. Musculoskeletal: No edema, cyanosis, or clubbing. Neuro: Alert, answering all questions appropriately. Cranial nerves grossly intact. Skin: No rashes or petechiae noted. Psych: Normal affect.  LAB RESULTS:  Lab Results  Component Value Date   NA 138 08/18/2023   K 4.0 08/18/2023   CL 106 08/18/2023   CO2 23  08/18/2023   GLUCOSE 86 08/18/2023   BUN 21 (H) 08/18/2023   CREATININE 1.13 (H) 08/18/2023   CALCIUM 8.3 (L) 08/18/2023   PROT 5.7 (L) 08/18/2023   ALBUMIN 3.5 08/18/2023   AST 313 (HH) 08/18/2023   ALT 370 (HH) 08/18/2023   ALKPHOS 107 08/18/2023   BILITOT 1.0 08/18/2023   GFRNONAA 58 (L) 08/18/2023   GFRAA 79 12/06/2020    Lab Results  Component Value Date   WBC 1.8 (L) 08/18/2023   NEUTROABS 0.4 (LL) 08/18/2023   HGB 12.1 08/18/2023   HCT 34.3 (L) 08/18/2023   MCV 102.7 (H) 08/18/2023   PLT 64 (L) 08/18/2023     STUDIES: No results found.  ONCOLOGY HISTORY:  Patient completed cycle 3 of Rituxan plus Treanda on March 26, 2018, but was then noted to have progressive disease.  She was then initiated on 480 mg Imbruvica in November 2019, but had progression of disease with increasing white blood cell count as well as CT scan on Jan 22, 2022 revealing increased lymphadenopathy consistent with progression of disease.  Attempted patient on venetoclax, but she could not tolerate it secondary to persistent diarrhea and transaminitis.   ASSESSMENT: CLL.  PLAN:    CLL: Confirmed by peripheral blood flow cytometry.  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  Patient completed 4 weekly cycles of Rituxan and now has transitioned to treatment every 4 weeks.  Patient completed Rituxan on May 07, 2023.  She also recently completed XRT to her left axilla.  Despite PET scan being completed on August 06, 2023, we do not have results.  She has been instructed to continue Claritin and famotidine along with her 10 mg Revlimid daily.  Patient has discontinued prednisone.  Return to clinic in 6 weeks for laboratory work and evaluation by clinical pharmacy.  Patient would then return to clinic in 3 months for laboratory work and further evaluation.  Will consider repeat imaging in 6 months. If patient had further progressive  disease, we will add back to Rituxan.  Leukopenia: Patient's total white blood cell count has trended down  to 1.8.  Monitor. Thrombocytopenia: Platelet count also declining and is now 64.  Monitor. Trigeminal neuralgia: Resolved.  MRI of the brain on Jan 25, 2019 was unremarkable.   Depression/anxiety: Chronic and unchanged.  Continue follow-up and treatment per primary care. Neuropathic pain/peripheral neuropathy: Chronic and unchanged. She reports she is now on disability.  Continue current follow-up and treatment as per neurology. Rituxan reaction: Patient will require additional premedications for preventative measures if Rituxan is reinitiated in the future. Rash: Resolved.  Continue Claritin and famotidine along with Revlimid.  Weight gain: Patient has discontinued prednisone. Transaminitis: Unclear etiology.  Patient reports she has not initiated any new medications.  Will repeat laboratory work in 1 week.   Patient expressed understanding and was in agreement with this plan. She also understands that She can call clinic at any time with any questions, concerns, or complaints.    Jeralyn Ruths, MD   08/18/2023 4:26 PM

## 2023-08-18 NOTE — Progress Notes (Signed)
Mentions having a lot of pain under both arms that has been going on for a while.

## 2023-08-18 NOTE — Progress Notes (Signed)
1051-critical value AST 313; ALt 370. CALLED by [facility] in cancer center lab. Read back process performed with lab tech. Dr. Orlie Dakin informed of critical value 1053- read back process performed with provider.

## 2023-08-25 ENCOUNTER — Other Ambulatory Visit: Payer: Self-pay | Admitting: *Deleted

## 2023-08-27 ENCOUNTER — Inpatient Hospital Stay: Payer: Medicare PPO

## 2023-08-27 ENCOUNTER — Ambulatory Visit
Admission: RE | Admit: 2023-08-27 | Discharge: 2023-08-27 | Disposition: A | Discharge status: Home / Self Care | Payer: Medicare PPO | Source: Ambulatory Visit | Attending: Radiation Oncology | Admitting: Radiation Oncology

## 2023-08-27 ENCOUNTER — Encounter: Payer: Self-pay | Admitting: Radiation Oncology

## 2023-08-27 VITALS — BP 103/72 | HR 67 | Temp 98.0°F | Wt 189.7 lb

## 2023-08-27 DIAGNOSIS — F419 Anxiety disorder, unspecified: Secondary | ICD-10-CM | POA: Diagnosis not present

## 2023-08-27 DIAGNOSIS — C9110 Chronic lymphocytic leukemia of B-cell type not having achieved remission: Secondary | ICD-10-CM

## 2023-08-27 DIAGNOSIS — D696 Thrombocytopenia, unspecified: Secondary | ICD-10-CM | POA: Diagnosis not present

## 2023-08-27 DIAGNOSIS — M79601 Pain in right arm: Secondary | ICD-10-CM | POA: Insufficient documentation

## 2023-08-27 DIAGNOSIS — Z923 Personal history of irradiation: Secondary | ICD-10-CM | POA: Diagnosis not present

## 2023-08-27 DIAGNOSIS — Z79899 Other long term (current) drug therapy: Secondary | ICD-10-CM | POA: Diagnosis not present

## 2023-08-27 DIAGNOSIS — G629 Polyneuropathy, unspecified: Secondary | ICD-10-CM | POA: Diagnosis not present

## 2023-08-27 DIAGNOSIS — R590 Localized enlarged lymph nodes: Secondary | ICD-10-CM | POA: Insufficient documentation

## 2023-08-27 DIAGNOSIS — M79602 Pain in left arm: Secondary | ICD-10-CM | POA: Insufficient documentation

## 2023-08-27 DIAGNOSIS — F17210 Nicotine dependence, cigarettes, uncomplicated: Secondary | ICD-10-CM | POA: Diagnosis not present

## 2023-08-27 DIAGNOSIS — F32A Depression, unspecified: Secondary | ICD-10-CM | POA: Diagnosis not present

## 2023-08-27 LAB — COMPREHENSIVE METABOLIC PANEL
ALT: 49 U/L — ABNORMAL HIGH (ref 0–44)
AST: 19 U/L (ref 15–41)
Albumin: 3.8 g/dL (ref 3.5–5.0)
Alkaline Phosphatase: 101 U/L (ref 38–126)
Anion gap: 8 (ref 5–15)
BUN: 17 mg/dL (ref 6–20)
CO2: 25 mmol/L (ref 22–32)
Calcium: 8.9 mg/dL (ref 8.9–10.3)
Chloride: 105 mmol/L (ref 98–111)
Creatinine, Ser: 1.34 mg/dL — ABNORMAL HIGH (ref 0.44–1.00)
GFR, Estimated: 47 mL/min — ABNORMAL LOW (ref >60)
Glucose, Bld: 96 mg/dL (ref 70–99)
Potassium: 3.9 mmol/L (ref 3.5–5.1)
Sodium: 138 mmol/L (ref 135–145)
Total Bilirubin: 1 mg/dL (ref <1.2)
Total Protein: 5.9 g/dL — ABNORMAL LOW (ref 6.5–8.1)

## 2023-08-27 LAB — CBC WITH DIFFERENTIAL/PLATELET
Abs Immature Granulocytes: 0 10*3/uL (ref 0.00–0.07)
Basophils Absolute: 0 10*3/uL (ref 0.0–0.1)
Basophils Relative: 2 %
Eosinophils Absolute: 0.2 10*3/uL (ref 0.0–0.5)
Eosinophils Relative: 7 %
HCT: 34.6 % — ABNORMAL LOW (ref 36.0–46.0)
Hemoglobin: 12.1 g/dL (ref 12.0–15.0)
Immature Granulocytes: 0 %
Lymphocytes Relative: 54 %
Lymphs Abs: 1.3 10*3/uL (ref 0.7–4.0)
MCH: 35.9 pg — ABNORMAL HIGH (ref 26.0–34.0)
MCHC: 35 g/dL (ref 30.0–36.0)
MCV: 102.7 fL — ABNORMAL HIGH (ref 80.0–100.0)
Monocytes Absolute: 0.2 10*3/uL (ref 0.1–1.0)
Monocytes Relative: 10 %
Neutro Abs: 0.7 10*3/uL — ABNORMAL LOW (ref 1.7–7.7)
Neutrophils Relative %: 27 %
Platelets: 68 10*3/uL — ABNORMAL LOW (ref 150–400)
RBC: 3.37 MIL/uL — ABNORMAL LOW (ref 3.87–5.11)
RDW: 12.7 % (ref 11.5–15.5)
Smear Review: NORMAL
WBC: 2.4 10*3/uL — ABNORMAL LOW (ref 4.0–10.5)
nRBC: 0 % (ref 0.0–0.2)

## 2023-08-27 NOTE — Progress Notes (Signed)
Radiation Oncology Follow up Note  Name: Monique [name]   Date:   08/27/2023 MRN:  [id number] DOB: 1970/10/21    This 52 y.o. female presents to the clinic today for 42-month follow-up status post palliative radiation therapy with low-dose radiation to her left axilla for involvement of CLL  REFERRING PROVIDER: Alba Cory, MD  HPI: Patient is a 52 year old female now out 3 months having completed palliative radiation therapy with low-dose radiation to her left axilla for involvement of bulky CLL.  Seen today in routine follow-up she is having no significant pain in her left axilla.  She states she does have some pain in her bilateral upper extremities not sure the etiology of that..  She is currently on Revlimid which she is tolerating well.  She had a recent PET CT scan which I have reviewed showed marked interval enlargement of left periaortic retroperitoneal nodal mass significant increase in size and metabolic activity concerning for transformation of CLL.  She also has essentially stable enlarged axillary mediastinal nodes with only mild hypermetabolic activity.  COMPLICATIONS OF TREATMENT: none  FOLLOW UP COMPLIANCE: keeps appointments   PHYSICAL EXAM:  BP 103/72   Pulse 67   Temp 98 F (36.7 C) (Tympanic)   Wt 189 lb 11.2 oz (86 kg)   LMP 08/17/2016   BMI 29.71 kg/m  No palpable adenopathy in the supraclavicular or axillary region is noted.  Well-developed well-nourished patient in NAD. HEENT reveals PERLA, EOMI, discs not visualized.  Oral cavity is clear. No oral mucosal lesions are identified. Neck is clear without evidence of cervical or supraclavicular adenopathy. Lungs are clear to A&P. Cardiac examination is essentially unremarkable with regular rate and rhythm without murmur rub or thrill. Abdomen is benign with no organomegaly or masses noted. Motor sensory and DTR levels are equal and symmetric in the upper and lower extremities. Cranial nerves II through XII are  grossly intact. Proprioception is intact. No peripheral adenopathy or edema is identified. No motor or sensory levels are noted. Crude visual fields are within normal range.  RADIOLOGY RESULTS: PET CT scans reviewed compatible with above-stated findings  PLAN: At this time patient's case will presented at tumor conference.  Not sure she would need rebiopsy of her retroperitoneal mass.  I discussed with Dr. Orlie Dakin possible low-dose palliative radiation therapy to this large retroperitoneal mass and that will also be discussed in our tumor conference.  I have advised the patient we may recommend some radiation to that area also.  I have otherwise asked to see her back in 4 months for follow-up.  Patient knows to call with any concerns at any time.  I would like to take this opportunity to thank you for allowing me to participate in the care of your patient.[name], MD

## 2023-08-28 ENCOUNTER — Other Ambulatory Visit: Payer: Self-pay | Admitting: *Deleted

## 2023-08-28 ENCOUNTER — Telehealth: Payer: Self-pay | Admitting: *Deleted

## 2023-08-28 ENCOUNTER — Other Ambulatory Visit: Payer: Self-pay

## 2023-08-28 NOTE — Telephone Encounter (Signed)
Patient called stating that Dr Rushie Chestnut told her yesterday that she has a cluster of lymph nodes in her abdominal and that Dr Orlie Dakin had said something about her liver labs being abnormal and she wants to speak with Dr Orlie Dakin about this as she does not have an appointment until March to see Dr Orlie Dakin

## 2023-08-29 ENCOUNTER — Other Ambulatory Visit: Payer: Self-pay | Admitting: *Deleted

## 2023-08-29 ENCOUNTER — Inpatient Hospital Stay: Payer: Medicare PPO | Admitting: Oncology

## 2023-08-29 ENCOUNTER — Telehealth: Payer: Self-pay | Admitting: *Deleted

## 2023-08-29 DIAGNOSIS — R948 Abnormal results of function studies of other organs and systems: Secondary | ICD-10-CM

## 2023-08-29 DIAGNOSIS — C9110 Chronic lymphocytic leukemia of B-cell type not having achieved remission: Secondary | ICD-10-CM

## 2023-08-29 NOTE — Progress Notes (Signed)
[facility]  [phone] [phone]  ID: Monique [name] OB: [birth year]  MR#: [id number]  AOZ#:[id number]  Patient Care Team: Alba Cory, MD as PCP - General (Family Medicine) Jeralyn Ruths, MD as Consulting Physician (Oncology) Zettie Pho, [facility] (Inactive) as Pharmacist (Pharmacist) Gustavus Bryant, LCSW as [facility] Care Management (Licensed Clinical Social Worker) Keitha Butte, RN as Registered Nurse (Oncology) Borders, Daryl Eastern, NP as Nurse Practitioner (Hospice and Palliative Medicine)  I connected with Monique [name] on 08/29/23 at  8:30 AM EST by video enabled telemedicine visit and verified that I am speaking with the correct person using two identifiers.   I discussed the limitations, risks, security and privacy concerns of performing an evaluation and management service by telemedicine and the availability of in-person appointments. I also discussed with the patient that there may be a patient responsible charge related to this service. The patient expressed understanding and agreed to proceed.   Other persons participating in the visit and their role in the encounter: Patient, MD.  Patient's location: Home. Provider's location: Clinic.  CHIEF COMPLAINT: CLL.  INTERVAL HISTORY: Patient agreed to video-assisted telemedicine visit for further evaluation and discussion of her PET scan results.  She currently feels well and is asymptomatic.  She continues to tolerate Revlimid without significant side effects.  She does not complain of weakness or fatigue today.  She continues to have a chronic peripheral neuropathy. She has no other neurologic complaints.  She has a good appetite.  She has no chest pain, shortness of breath, cough, or hemoptysis.  She denies any nausea, vomiting, or constipation.  She has no melena or hematochezia.  She has no urinary complaints.  Patient offers no further specific  complaints today.  REVIEW OF SYSTEMS:   Review of Systems  Constitutional: Negative.  Negative for diaphoresis, fever, malaise/fatigue and weight loss.  HENT:  Negative for congestion.   Respiratory: Negative.  Negative for cough and shortness of breath.   Cardiovascular: Negative.  Negative for chest pain and leg swelling.  Gastrointestinal: Negative.  Negative for abdominal pain, constipation and diarrhea.  Genitourinary: Negative.  Negative for dysuria and frequency.  Musculoskeletal: Negative.  Negative for back pain, myalgias and neck pain.  Skin: Negative.  Negative for rash.  Neurological:  Positive for tingling and sensory change. Negative for focal weakness and weakness.  Psychiatric/Behavioral: Negative.  Negative for depression and suicidal ideas. The patient is not nervous/anxious.     As per HPI. Otherwise, a complete review of systems is negative.  PAST MEDICAL HISTORY: Past Medical History:  Diagnosis Date   Anxiety    Bursitis    leg pain   Carpal tunnel syndrome    Cervical dysplasia    hx LEEP over 18 years ago.    Chronic lymphocytic leukemia (HCC) 2018   Dr Orlie Dakin.  (in lymph nodes)   COPD (chronic obstructive pulmonary disease) (HCC)    COVID-19 2021   Depression    Eating disorder    GERD (gastroesophageal reflux disease)    History of self-harm    Insomnia    Obsession    Tobacco abuse    Vitamin B12 deficiency (non anemic)     PAST SURGICAL HISTORY: Past Surgical History:  Procedure Laterality Date   BREAST BIOPSY Right 01/05/2018   US guided biopsy of 2 areas and 1 lymph node, MIXED INFLAMMATION AND GIANT CELL REACTION   CERVICAL BIOPSY  W/ LOOP ELECTRODE EXCISION  COLONOSCOPY WITH PROPOFOL N/A 04/21/2019   Procedure: COLONOSCOPY WITH PROPOFOL;  Surgeon: Pasty Spillers, MD;  Location: [facility] ENDOSCOPY;  Service: Gastroenterology;  Laterality: N/A;   OTHER SURGICAL HISTORY     scar tissue removed from vocal cords   TUBAL LIGATION      VAGINAL HYSTERECTOMY N/A 01/22/2021   Procedure: HYSTERECTOMY VAGINAL; BILATERAL SALPINGECTOMY;  Surgeon: Hildred Laser, MD;  Location: [facility] ORS;  Service: Gynecology;  Laterality: N/A;   vocal cord surgery  2005    FAMILY HISTORY: Family History  Problem Relation Age of Onset   Depression Mother    Cancer Mother        thyroid   Alcohol abuse Father    COPD Father    Alcohol abuse Brother    Depression Brother    Bipolar disorder Brother    Suicidality Brother    ADD / ADHD Son    Breast cancer Neg Hx     ADVANCED DIRECTIVES (Y/N):  N  HEALTH MAINTENANCE: Social History   Tobacco Use   Smoking status: Every Day    Current packs/day: 1.00    Average packs/day: 1 pack/day for 36.9 years (36.9 ttl pk-yrs)    Types: Cigarettes    Start date: 09/25/1986   Smokeless tobacco: Never  Vaping Use   Vaping status: Former  Substance Use Topics   Alcohol use: Not Currently    Alcohol/week: 0.0 standard drinks of alcohol    Comment: rarely   Drug use: Yes    Frequency: 7.0 times per week    Types: Marijuana     Colonoscopy:  PAP:  Bone density:  Lipid panel:  Allergies  Allergen Reactions   Doxycycline Rash    Rash on cheeks and sides of nose    Duloxetine Rash    Current Outpatient Medications  Medication Sig Dispense Refill   albuterol (VENTOLIN HFA) 108 (90 Base) MCG/ACT inhaler Inhale 2 puffs into the lungs every 6 (six) hours as needed for wheezing or shortness of breath. 8 g 0   ARIPiprazole (ABILIFY) 5 MG tablet Take 1 tablet (5 mg total) by mouth daily. 90 tablet 1   Ascorbic Acid (VITAMIN C) 1000 MG tablet Take 1,000 mg by mouth daily as needed (immune health).     ASPIRIN 81 PO Take 81 mg by mouth daily.     atorvastatin (LIPITOR) 10 MG tablet Take 1 tablet (10 mg total) by mouth in the morning. 90 tablet 0   Ca Phosphate-Cholecalciferol (CALTRATE GUMMY BITES) 250-10 MG-MCG CHEW Chew 1 gummy by mouth 2 (two) times daily. 180 tablet 3   clobetasol cream  (TEMOVATE) 0.05 % Apply topically to bites on lower legs as directed daily up to 5 days a week until clear then use as needed for flares 45 g 1   famotidine (PEPCID) 10 MG tablet Take by mouth.     Glycopyrrolate-Formoterol (BEVESPI AEROSPHERE) 9-4.8 MCG/ACT AERO Inhale 2 puffs into the lungs daily. 10.7 g 3   lenalidomide (REVLIMID) 10 MG capsule TAKE 1 CAPSULE BY MOUTH EVERY DAY 28 capsule 0   loratadine (CLARITIN) 10 MG tablet Take 1 tablet (10 mg total) by mouth daily in the morning. 90 tablet 1   Magnesium 500 MG CAPS Take 1 capsule (500 mg total) by mouth daily. 90 capsule PRN   meloxicam (MOBIC) 15 MG tablet Take 1 tablet (15 mg total) by mouth daily as needed for pain. 90 tablet 0   omeprazole (PRILOSEC) 20 MG capsule Take 1 capsule (20  mg total) by mouth daily in the morning. 150 capsule 0   pregabalin (LYRICA) 150 MG capsule Take 1 capsule (150 mg total) by mouth 3 (three) times daily. 90 capsule 1   PREMARIN 0.625 MG tablet TAKE ONE TABLET BY MOUTH ONCE daily FOR 21 DAYS THEN DO not take FOR 7 days 90 tablet 3   tiZANidine (ZANAFLEX) 2 MG tablet Take 1 tablet (2 mg total) by mouth at bedtime. 90 tablet 1   umeclidinium-vilanterol (ANORO ELLIPTA) 62.5-25 MCG/ACT AEPB Inhale 1 puff into the lungs daily. 60 each 5   vitamin B-12 (CYANOCOBALAMIN) 500 MCG tablet Take 250 mcg by mouth every morning.     No current facility-administered medications for this visit.    OBJECTIVE: There were no vitals filed for this visit.     There is no height or weight on file to calculate BMI.    ECOG FS:0 - Asymptomatic  General: Well-developed, well-nourished, no acute distress. HEENT: Normocephalic. Neuro: Alert, answering all questions appropriately. Cranial nerves grossly intact. Psych: Normal affect.  LAB RESULTS:  Lab Results  Component Value Date   NA 138 08/27/2023   K 3.9 08/27/2023   CL 105 08/27/2023   CO2 25 08/27/2023   GLUCOSE 96 08/27/2023   BUN 17 08/27/2023   CREATININE  1.34 (H) 08/27/2023   CALCIUM 8.9 08/27/2023   PROT 5.9 (L) 08/27/2023   ALBUMIN 3.8 08/27/2023   AST 19 08/27/2023   ALT 49 (H) 08/27/2023   ALKPHOS 101 08/27/2023   BILITOT 1.0 08/27/2023   GFRNONAA 47 (L) 08/27/2023   GFRAA 79 12/06/2020    Lab Results  Component Value Date   WBC 2.4 (L) 08/27/2023   NEUTROABS 0.7 (L) 08/27/2023   HGB 12.1 08/27/2023   HCT 34.6 (L) 08/27/2023   MCV 102.7 (H) 08/27/2023   PLT 68 (L) 08/27/2023     STUDIES: NM PET Image Restag (PS) Skull Base To Thigh Result Date: 08/20/2023 CLINICAL DATA:  Subsequent treatment strategy for chronic lymphocytic leukemia. EXAM: NUCLEAR MEDICINE PET SKULL BASE TO THIGH TECHNIQUE: 10.2 mCi F-18 FDG was injected intravenously. Full-ring PET imaging was performed from the skull base to thigh after the radiotracer. CT data was obtained and used for attenuation correction and anatomic localization. Fasting blood glucose: 84 mg/dl COMPARISON:  None Available. FINDINGS: Mediastinal blood pool activity: SUV max 0.3 Liver activity: SUV max 3.2 NECK: No hypermetabolic lymph nodes in the neck. Incidental CT findings: None. CHEST: Bilateral moderately enlarged and mildly hypermetabolic axial lymph nodes are again noted. Cluster of 12 mm short axis lymph nodes in the RIGHT axilla with SUV max equal 2.8 is unchanged from SUV max equal 2.3 on comparison exam. No change in node volume. RIGHT lower paratracheal node measuring 14 mm compares to 15 mm with SUV max equal 3.7 compared SUV max equal 2.7. Several small subcentimeter nodules in the lateral aspect of the RIGHT upper lobe are not changed (image 64/6 and image 66/6 for example). Incidental CT findings: Physiologic muscle activity in the trapezius muscles. ABDOMEN/PELVIS: Marked interval enlargement of LEFT periaortic retroperitoneal nodal mass measuring 4.3 cm (image 119/6) compared to 2.5 cm. Increase in metabolic activity of the this enlarged nodal mass with SUV max equal 6.7 (image  118) compared SUV max equal 4.8. More superior Peri aortic adenopathy at the level of the LEFT renal vein is minimally increased in size measuring 2.1 cm compared to 1.5 cm with SUV max equal 4.1 compared to SUV max equal 2.7. Minimal change  in pelvic adenopathy. For example RIGHT operator node measuring 1.8 cm compares to 1.6 cm with SUV max equal 3.8 compared SUV max equal 3.2. Spleen is normal volume and normal metabolic active Incidental CT findings: None. SKELETON: No focal hypermetabolic activity to suggest skeletal metastasis. Incidental CT findings: None. IMPRESSION: 1. Dominant finding is marked interval enlargement of a LEFT periaortic retroperitoneal nodal mass with significant increase in metabolic activity. Findings concerning for transformation of chronic lymphocytic leukemia. 2. Essentially stable enlarged axillary and mediastinal lymph nodes with mild metabolic activity. 3. Mild interval increase in pelvic adenopathy size and metabolic activity. Mild increase in upper abdominal periaortic adenopathy. 4. Normal marrow and spleen. Electronically Signed   By: Genevive Bi M.D.   On: 08/20/2023 11:57    ONCOLOGY HISTORY:  Patient completed cycle 3 of Rituxan plus Treanda on March 26, 2018, but was then noted to have progressive disease.  She was then initiated on 480 mg Imbruvica in November 2019, but had progression of disease with increasing white blood cell count as well as CT scan on Jan 22, 2022 revealing increased lymphadenopathy consistent with progression of disease.  Attempted patient on venetoclax, but she could not tolerate it secondary to persistent diarrhea and transaminitis.   ASSESSMENT: CLL.  PLAN:    CLL: Confirmed by peripheral blood flow cytometry.  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  Patient completed 4 weekly cycles of Rituxan and now has transitioned to treatment every 4 weeks.  Patient  completed Rituxan on May 07, 2023.  She also recently completed XRT to her left axilla.  PET scan results from August 06, 2023 reviewed independently and also discussed at tumor board with interventional radiology.  Plan is to do a CT-guided biopsy of hypermetabolic retroperitoneal lymph nodes to ensure there has been no transformation of CLL.  She has been instructed to continue Claritin and famotidine along with her 10 mg Revlimid daily.  Patient has discontinued prednisone.  Return to clinic 1 week after her biopsy to discuss the results and treatment planning if necessary.   Leukopenia: Patient's most recent total white blood cell count has increased to 2.4.  Monitor.   Thrombocytopenia: Chronic and unchanged.  Patient's platelet count is 68. Monitor. Trigeminal neuralgia: Resolved.  MRI of the brain on Jan 25, 2019 was unremarkable.   Depression/anxiety: Chronic and unchanged.  Continue follow-up and treatment per primary care. Neuropathic pain/peripheral neuropathy: Chronic and unchanged. She reports she is now on disability.  Continue current follow-up and treatment as per neurology. Rituxan reaction: Patient will require additional premedications for preventative measures if Rituxan is reinitiated in the future. Rash: Resolved.  Continue Claritin and famotidine along with Revlimid.  Weight gain: Patient has discontinued prednisone. Transaminitis: Unclear etiology, but resolved.    I provided 30 minutes of face-to-face video visit time during this encounter which included chart review, counseling, and coordination of care as documented above.   Patient expressed understanding and was in agreement with this plan. She also understands that She can call clinic at any time with any questions, concerns, or complaints.    Jeralyn Ruths, MD   08/29/2023 8:49 AM

## 2023-08-29 NOTE — Telephone Encounter (Signed)
Pt was on the tumor board and finnegan talked to pt about a biopsy. She was agreeable. We are waiting for date. The form was sent to IR and the fax went through

## 2023-08-29 NOTE — Telephone Encounter (Signed)
.   we are trying to make a space and if she takes the ASA we will have to start  again for 5 days of no asa. She will hold it starting sat. And I will call her on Monday about the bx. She is agreeable

## 2023-08-31 ENCOUNTER — Other Ambulatory Visit: Payer: Self-pay | Admitting: Oncology

## 2023-08-31 DIAGNOSIS — C9110 Chronic lymphocytic leukemia of B-cell type not having achieved remission: Secondary | ICD-10-CM

## 2023-09-01 ENCOUNTER — Encounter: Payer: Self-pay | Admitting: Oncology

## 2023-09-01 NOTE — Progress Notes (Signed)
Irish Lack, MD sent to [facility] PROCEDURE / BIOPSY REVIEW Date: 08/29/23  Requested Biopsy site: Left para-aortic LN mass Reason for request: Hx CLL; suspected transformation to lymphoma Imaging review: Best seen on PET  Decision: Approved Imaging modality to perform: Ultrasound and CT Schedule with: Moderate Sedation Schedule for: Any VIR  Additional comments:   Please contact me with questions, concerns, or if issue pertaining to this request arise.  Reola Calkins, MD [facility]

## 2023-09-02 ENCOUNTER — Other Ambulatory Visit: Payer: Self-pay

## 2023-09-02 ENCOUNTER — Other Ambulatory Visit (HOSPITAL_COMMUNITY): Payer: Self-pay

## 2023-09-02 ENCOUNTER — Other Ambulatory Visit: Payer: Self-pay | Admitting: Family Medicine

## 2023-09-02 IMAGING — MG DIGITAL DIAGNOSTIC BILAT W/ TOMO W/ CAD
8 series · 8 of 24 positions shown · non-contrast
Comparison: Previous exam(s).

CLINICAL DATA: Clinically appreciated palpable areas in the upper
breast bilaterally.

EXAM:
DIGITAL DIAGNOSTIC BILATERAL MAMMOGRAM WITH TOMOSYNTHESIS AND CAD;
ULTRASOUND LEFT BREAST LIMITED; ULTRASOUND RIGHT BREAST LIMITED
TECHNIQUE: Bilateral digital diagnostic mammography and breast tomosynthesis
was performed. The images were evaluated with computer-aided
detection.; Targeted ultrasound examination of the left breast was
performed.; Targeted ultrasound examination of the right breast was
performed

[R MLO synth-2D]
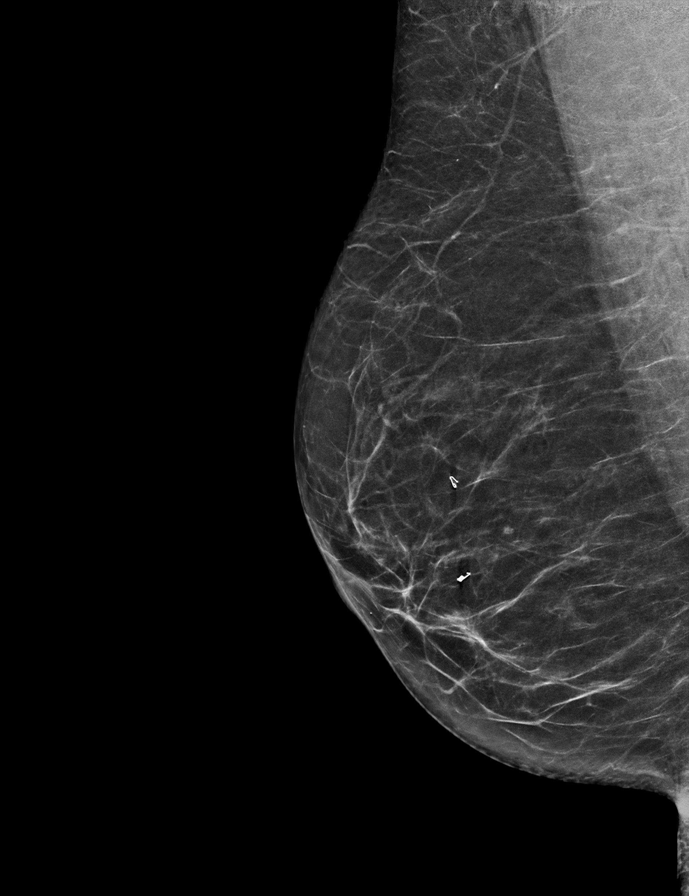

[L CC synth-2D]
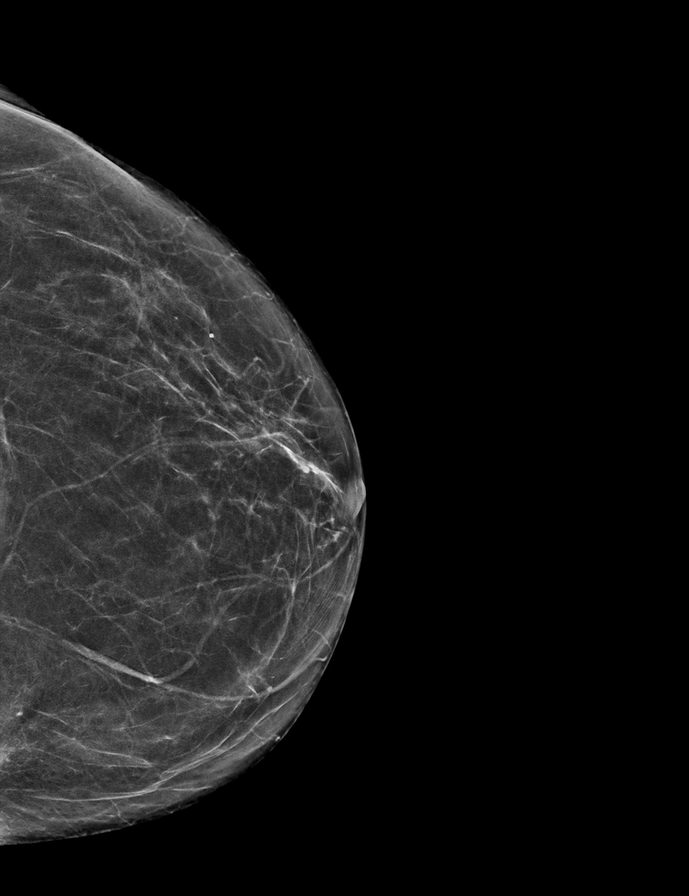

[R CC synth-2D]
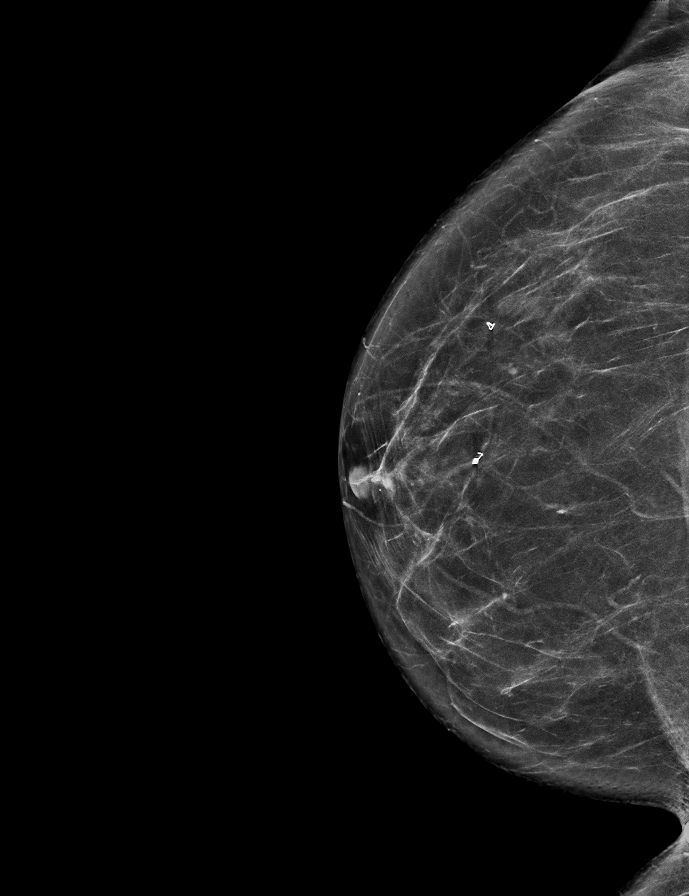

[L MLO synth-2D]
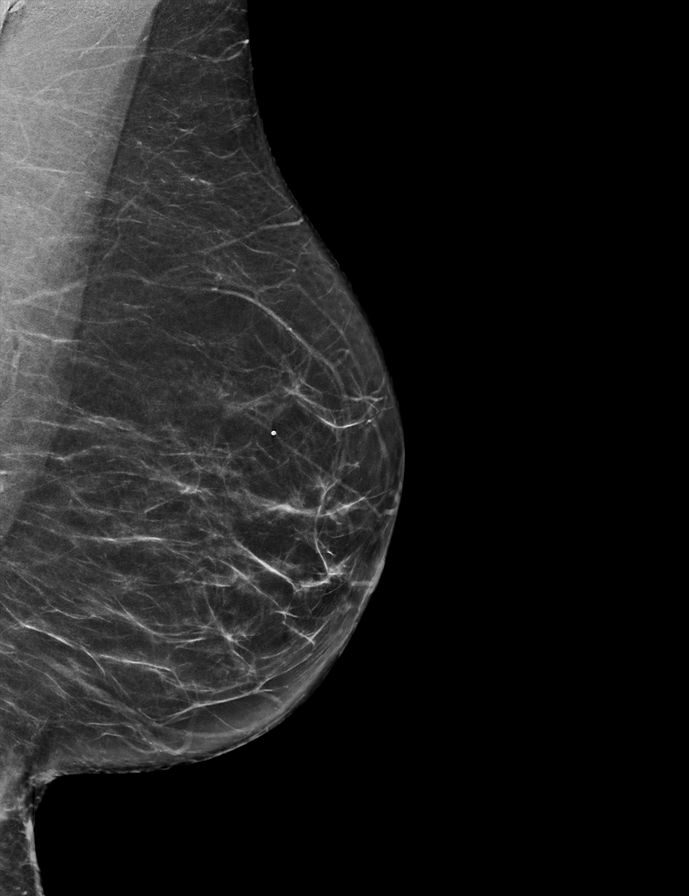

[R MLO tomo · tomo slice 31/61.0]
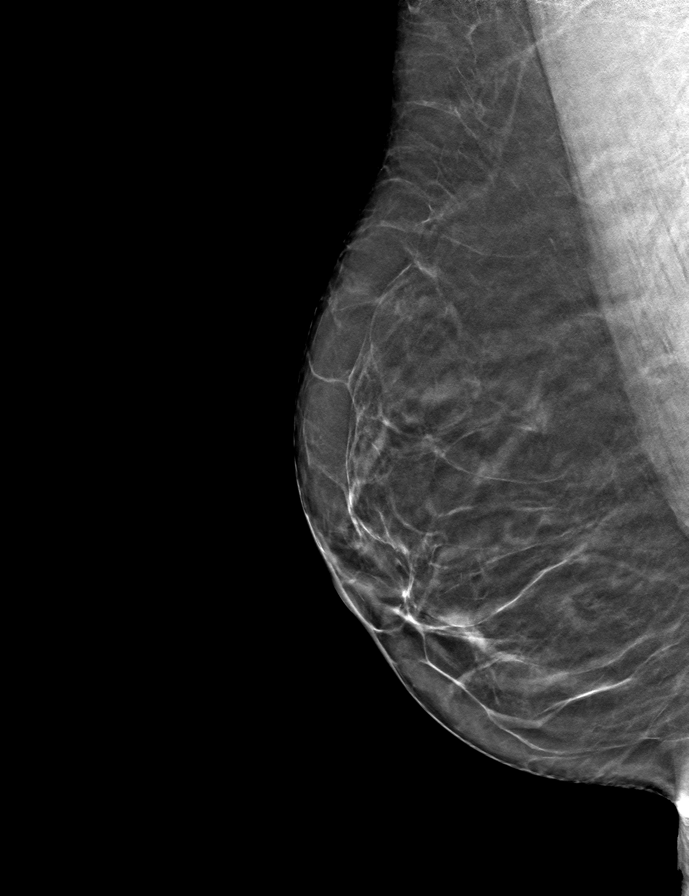

[L MLO tomo · tomo slice 31/61.0]
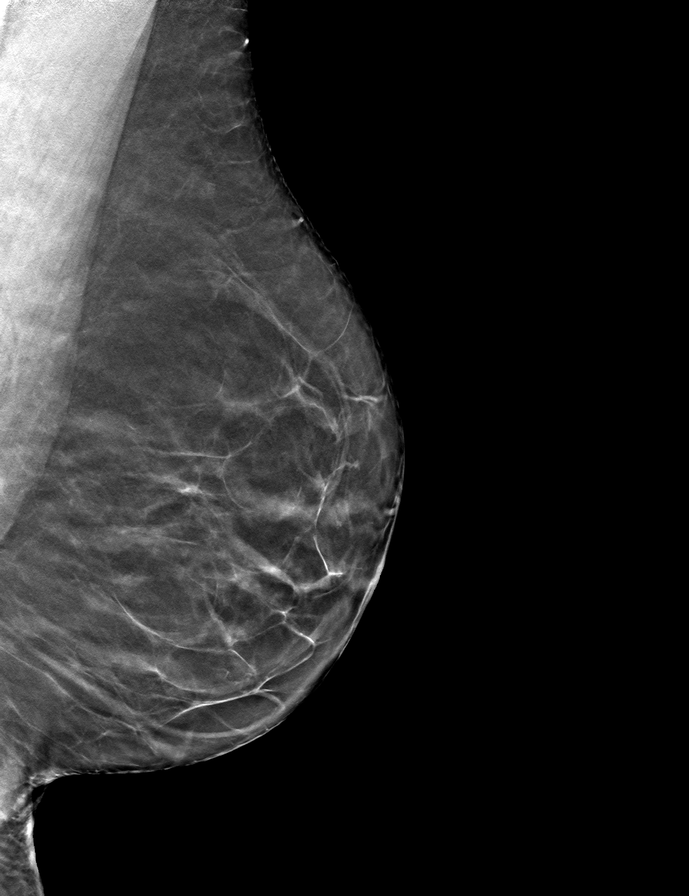

[L CC tomo · tomo slice 31/60.0]
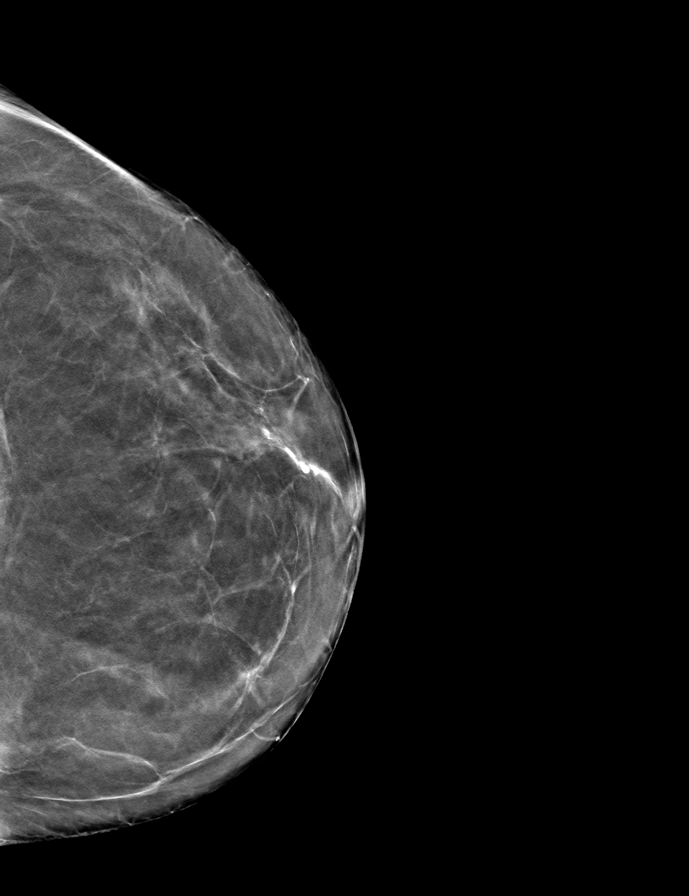

[R CC tomo · tomo slice 34/67.0]
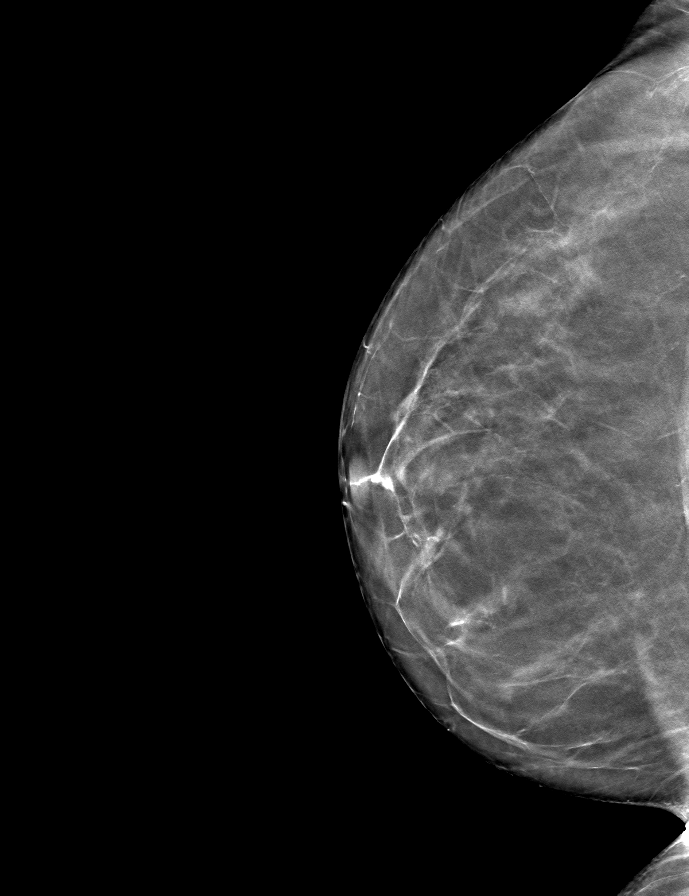

[8 of 24 positions shown; findings below may reference images not displayed]

ACR Breast Density Category b: There are scattered areas of
fibroglandular density.
FINDINGS: Diagnostic images were obtained of the site of clinical palpable
concern in the RIGHT breast. No suspicious mammographic findings are
identified in this region. Multiple biopsy clips are identified. No
suspicious mass, distortion, or microcalcifications are identified
to suggest presence of malignancy.

Diagnostic images were obtained of the site of palpable concern in
the LEFT breast. No suspicious mass, distortion, or
microcalcifications are identified to suggest presence of
malignancy.

On physical exam, no suspicious mass is appreciated.

Targeted ultrasound was performed of the RIGHT upper breast. No
suspicious cystic or solid mass is seen.

Targeted ultrasound was performed of the LEFT upper breast. No
suspicious cystic or solid mass is seen.
IMPRESSION: 1. No mammographic or sonographic evidence of malignancy at the
sites of clinically appreciated palpable concern bilaterally. Any
further workup of the patient's symptoms should be based on the
clinical assessment. Recommend routine annual screening mammogram in
1 year.
2. No mammographic evidence of malignancy bilaterally.

RECOMMENDATION:
Screening mammogram in one year.(Code:V0-P-1JA)

I have discussed the findings and recommendations with the patient.
If applicable, a reminder letter will be sent to the patient
regarding the next appointment.

BI-RADS CATEGORY  1: Negative.

## 2023-09-04 ENCOUNTER — Ambulatory Visit: Payer: Medicare PPO | Admitting: Nurse Practitioner

## 2023-09-04 ENCOUNTER — Other Ambulatory Visit: Payer: Self-pay

## 2023-09-04 ENCOUNTER — Ambulatory Visit: Payer: Self-pay | Admitting: *Deleted

## 2023-09-04 ENCOUNTER — Encounter: Payer: Self-pay | Admitting: Nurse Practitioner

## 2023-09-04 ENCOUNTER — Encounter: Payer: Self-pay | Admitting: Oncology

## 2023-09-04 VITALS — BP 108/66 | HR 62 | Temp 98.0°F | Resp 16 | Ht 67.0 in | Wt 187.2 lb

## 2023-09-04 DIAGNOSIS — R350 Frequency of micturition: Secondary | ICD-10-CM | POA: Diagnosis not present

## 2023-09-04 DIAGNOSIS — R051 Acute cough: Secondary | ICD-10-CM

## 2023-09-04 LAB — POCT URINALYSIS DIPSTICK
Appearance: ABNORMAL
Glucose, UA: NEGATIVE
Ketones, UA: NEGATIVE
Nitrite, UA: NEGATIVE
Protein, UA: POSITIVE — AB
Spec Grav, UA: 1.01 (ref 1.010–1.025)
Urobilinogen, UA: 0.2 U/dL
pH, UA: 5 (ref 5.0–8.0)

## 2023-09-04 MED ORDER — CEPHALEXIN 500 MG PO CAPS
500.0000 mg | ORAL_CAPSULE | Freq: Two times a day (BID) | ORAL | 0 refills | Status: AC
Start: 2023-09-04 — End: 2023-09-09

## 2023-09-04 MED ORDER — HYDROCOD POLI-CHLORPHE POLI ER 10-8 MG/5ML PO SUER: 5.0000 mL | Freq: Two times a day (BID) | ORAL | 0 refills | Status: DC | PRN

## 2023-09-04 NOTE — Telephone Encounter (Signed)
  Chief Complaint: cough Symptoms: productive cough- spells causing interruption in sleep, SOB, nasal drainage  Frequency: 3-4 days Pertinent Negatives: Patient denies fever Disposition: [] ED /[] Urgent Care (no appt availability in office) / [x] Appointment(In office/virtual)/ []  [facility]/ [] Home Care/ [] Refused Recommended Disposition /[] [facility]/ []  Follow-up with PCP Additional Notes: Patient states she has been treating cough- not better- she is requesting prescription cough medication. Appointment scheduled

## 2023-09-04 NOTE — Telephone Encounter (Signed)
Reason for Disposition  SEVERE coughing spells (e.g., whooping sound after coughing, vomiting after coughing)  Answer Assessment - Initial Assessment Questions 1. ONSET: "When did the cough begin?"      3-4 days 2. SEVERITY: "How bad is the cough today?"      Coughing spells- hard to sleep 3. SPUTUM: "Describe the color of your sputum" (none, dry cough; clear, white, yellow, green)     clear 4. HEMOPTYSIS: "Are you coughing up any blood?" If so ask: "How much?" (flecks, streaks, tablespoons, etc.)     no 5. DIFFICULTY BREATHING: "Are you having difficulty breathing?" If Yes, ask: "How bad is it?" (e.g., mild, moderate, severe)    - MILD: No SOB at rest, mild SOB with walking, speaks normally in sentences, can lie down, no retractions, pulse < 100.    - MODERATE: SOB at rest, SOB with minimal exertion and prefers to sit, cannot lie down flat, speaks in phrases, mild retractions, audible wheezing, pulse 100-120.    - SEVERE: Very SOB at rest, speaks in single words, struggling to breathe, sitting hunched forward, retractions, pulse > 120      Mild- cough causes SOB 6. FEVER: "Do you have a fever?" If Yes, ask: "What is your temperature, how was it measured, and when did it start?"     no 7. CARDIAC HISTORY: "Do you have any history of heart disease?" (e.g., heart attack, congestive heart failure)      no 8. LUNG HISTORY: "Do you have any history of lung disease?"  (e.g., pulmonary embolus, asthma, emphysema)     COPD  10. OTHER SYMPTOMS: "Do you have any other symptoms?" (e.g., runny nose, wheezing, chest pain)       Nasal drainage  Protocols used: Cough - Acute Productive-A-AH

## 2023-09-04 NOTE — Progress Notes (Signed)
BP 108/66 (BP Location: Right Arm, Patient Position: Sitting, Cuff Size: Large)   Pulse 62   Temp 98 F (36.7 C) (Oral)   Resp 16   Ht 5\' 7"  (1.702 m)   Wt 187 lb 3.2 oz (84.9 kg)   LMP 08/17/2016   SpO2 98%   BMI 29.32 kg/m    Subjective:    Patient ID: Monique [name], female    DOB: 1971/05/27, 52 y.o.   MRN: [id number]  HPI: Monique [name] is a 52 y.o. female  Chief Complaint  Patient presents with   Cough    X4 days    Discussed the use of AI scribe software for clinical note transcription with the patient, who gave verbal consent to proceed.  History of Present Illness   The patient presents with a four-day history of a severe cough that has been so intense it has caused urinary incontinence. The cough has been disruptive to their sleep, waking them up as early as three in the morning. They have also experienced nasal drainage, particularly when bending over or sitting. They have not taken any over-the-counter medications for the cough, but have used previously prescribed tussinex, which they report was effective.  In addition to the cough, the patient has been experiencing urinary symptoms, including frequent urination and urgency, which they believe may be due to a urinary tract infection. These symptoms have been ongoing for a while and have been so severe that they sometimes barely make it to the bathroom in time. They have had similar symptoms before.       09/04/2023   10:23 AM 06/19/2023    2:03 PM 05/21/2023    1:51 PM  Depression screen PHQ 2/9  Decreased Interest 0 0 0  Down, Depressed, Hopeless 0 0 0  PHQ - 2 Score 0 0 0  Altered sleeping   0  Tired, decreased energy   0  Change in appetite   0  Feeling bad or failure about yourself    0  Trouble concentrating   0  Moving slowly or fidgety/restless   0  Suicidal thoughts   0  PHQ-9 Score   0  Difficult doing work/chores   Not difficult at all    Relevant past medical, surgical, family and social  history reviewed and updated as indicated. Interim medical history since our last visit reviewed. Allergies and medications reviewed and updated.  Review of Systems  Ten systems reviewed and is negative except as mentioned in HPI       Objective:    BP 108/66 (BP Location: Right Arm, Patient Position: Sitting, Cuff Size: Large)   Pulse 62   Temp 98 F (36.7 C) (Oral)   Resp 16   Ht 5\' 7"  (1.702 m)   Wt 187 lb 3.2 oz (84.9 kg)   LMP 08/17/2016   SpO2 98%   BMI 29.32 kg/m    Wt Readings from Last 3 Encounters:  09/04/23 187 lb 3.2 oz (84.9 kg)  08/27/23 189 lb 11.2 oz (86 kg)  08/18/23 189 lb 9.6 oz (86 kg)    Physical Exam  Constitutional: Patient appears well-developed and well-nourished.  No distress.  HEENT: head atraumatic, normocephalic, pupils equal and reactive to light, neck supple, throat within normal limits Cardiovascular: Normal rate, regular rhythm and normal heart sounds.  No murmur heard. No BLE edema. Pulmonary/Chest: Effort normal and breath sounds normal. No respiratory distress. Abdominal: Soft.  There is no tenderness. No CVA tenderness Psychiatric:  Patient has a normal mood and affect. behavior is normal. Judgment and thought content normal.  Results for orders placed or performed in visit on 09/04/23  POCT urinalysis dipstick   Collection Time: 09/04/23 10:35 AM  Result Value Ref Range   Color, UA straw    Clarity, UA turbid    Glucose, UA Negative Negative   Bilirubin, UA Small    Ketones, UA Negative    Spec Grav, UA 1.010 1.010 - 1.025   Blood, UA Small    pH, UA 5.0 5.0 - 8.0   Protein, UA Positive (A) Negative   Urobilinogen, UA 0.2 0.2 or 1.0 E.U./dL   Nitrite, UA Negative    Leukocytes, UA Moderate (2+) (A) Negative   Appearance Abnormal    Odor Mal    *Note: Due to a large number of results and/or encounters for the requested time period, some results have not been displayed. A complete set of results can be found in Results Review.        Assessment & Plan:   Problem List Items Addressed This Visit   None Visit Diagnoses       Acute cough    -  Primary   Relevant Medications   chlorpheniramine-HYDROcodone (TUSSIONEX) 10-8 MG/5ML     Urinary frequency       Relevant Medications   cephALEXin (KEFLEX) 500 MG capsule   Other Relevant Orders   POCT urinalysis dipstick (Completed)   Urine Culture        Assessment and Plan    Acute Cough Severe cough for 4 days causing urinary incontinence. No fever or other systemic symptoms. Responded to previous treatment with hydrocodone cough suppressant. -Prescribe tussinex cough suppressant to be sent to [facility]. -Advise over-the-counter Mucinex, Zyrtec, Flonase for symptomatic relief.  Urinary Frequency Ongoing urinary frequency with urgency. No fever or back pain. Urine positive for blood, protein, and leukocytes. -Send urine for culture. -Start Keflex 500mg  BID for 5 days. -Advise to increase fluid intake.        Follow up plan: Return if symptoms worsen or fail to improve.

## 2023-09-05 ENCOUNTER — Encounter: Payer: Self-pay | Admitting: Oncology

## 2023-09-05 ENCOUNTER — Other Ambulatory Visit: Payer: Self-pay

## 2023-09-06 LAB — URINE CULTURE
MICRO NUMBER:: 15873412
SPECIMEN QUALITY:: ADEQUATE

## 2023-09-08 ENCOUNTER — Other Ambulatory Visit: Payer: Self-pay

## 2023-09-08 ENCOUNTER — Telehealth: Payer: Self-pay

## 2023-09-08 ENCOUNTER — Telehealth: Payer: Self-pay | Admitting: Family Medicine

## 2023-09-08 ENCOUNTER — Other Ambulatory Visit (HOSPITAL_COMMUNITY): Payer: Self-pay

## 2023-09-08 MED ORDER — UMECLIDINIUM-VILANTEROL 62.5-25 MCG/ACT IN AEPB
1.0000 | INHALATION_SPRAY | Freq: Every day | RESPIRATORY_TRACT | 1 refills | Status: DC
  Filled 2023-09-08: qty 60, 30d supply, fill #0
  Filled 2023-09-22 – 2023-10-02 (×2): qty 60, 30d supply, fill #1
  Filled ????-??-??: fill #1

## 2023-09-08 NOTE — Telephone Encounter (Signed)
Called pt and informed her PCP has not prescribed her Prednisone, pt verbalized is from another office. She will contact that office to send rx.

## 2023-09-08 NOTE — Telephone Encounter (Signed)
TRIAGE VOICEMAIL: Monique [name] with [facility] states patient advised them she was expecting a rx for Predinisone. Monique [name] states the rx was sent to another pharmacy previously and they were unable to have the rx transferred as it did not have any refills. He is requesting a new rx for prednisone for this patient.

## 2023-09-08 NOTE — Telephone Encounter (Signed)
Monique [name] from [name] stated the patient has reached out to them stating she is waiting for an Rx for prednisone.  Please advise,

## 2023-09-08 NOTE — Telephone Encounter (Signed)
The patient called in stating she was taking Glycopyrrolate-Formoterol (BEVESPI AEROSPHERE) 9-4.8 MCG/ACT AERO but her insurance would not cover it. This is why she was switched to umeclidinium-vilanterol (ANORO ELLIPTA) 62.5-25 MCG/ACT AEPB which she states her provider put her on. She states she spoke to the pharmacy and they told her it got cancelled when she states this is the correct one she was switched to. Please assist her further as she uses.   [facility] Phone: [phone]  Fax: [phone]      She states she has no inhaler as of now and needs this as soon as possible please.

## 2023-09-08 NOTE — Telephone Encounter (Signed)
[facility] permanently closed unable to cancel [facility] prescription, I called [facility] spoke to [place] and stated on 08/04/23 they sent to her house Anoro 30 day supply pt is due for any more fills.

## 2023-09-08 NOTE — Progress Notes (Signed)
Patient for CT guided LT peri-aorti LN Mass Biopsy on Mon 09/15/2023, I called and spoke with the patient on the phone and gave pre-procedure instructions. Pt was made aware to be here at 7:30a, last dose of ASA 81mg  was on Tues 09/09/2023, NPO after MN prior to procedure as well as driver post procedure/recovery/discharge. Pt stated understanding.  Called 09/08/2023

## 2023-09-08 NOTE — Telephone Encounter (Signed)
Chart reviewed. Prednisone not rx'd by any of our providers. Advised to contact PCP.

## 2023-09-08 NOTE — Telephone Encounter (Signed)
I have sent the request to Dr.Sowles to fill prescription.

## 2023-09-09 ENCOUNTER — Encounter: Payer: Self-pay | Admitting: Oncology

## 2023-09-09 ENCOUNTER — Other Ambulatory Visit: Payer: Self-pay

## 2023-09-09 ENCOUNTER — Other Ambulatory Visit (HOSPITAL_COMMUNITY): Payer: Self-pay

## 2023-09-09 NOTE — H&P (Signed)
Chief Complaint: Left periaortic retroperitoneal nodal mass. Request is for biopsy for further evaluation of transformation to lymphoma  Referring Physician(s): Rao,Archana C  Supervising Physician: Simonne Come  Patient Status: [facility] - Out-pt  History of Present Illness: Monique [name] is a 52 y.o. female outpatient. History of GERD, COPD, CLL. PET scan from 11.20.24 reads Marked interval enlargement of LEFT periaortic retroperitoneal nodal mass measuring 4.3 cm (image 119/6) compared to 2.5 cm. Increase in metabolic activity of the this enlarged nodal mass with SUV max equal 6.7 (image 118) compared SUV max equal 4.8. Team is requesting a left periaortic retroperitoneal nodal mass for further evaluation of transformation to lymphoma. Case reviewed and approved IR Attending Dr. Irish Lack.  Return precautions and treatment recommendations and follow-up discussed with the patient  who is agreeable with the plan.    All labs pending. Patient is on 81 mg of ASA. Last dose given on over 5 days ago. No pertinent allergies Patient has been NPO since midnight.   Return precautions and treatment recommendations and follow-up discussed with the patient who is agreeable with the plan.    Past Medical History:  Diagnosis Date   Anxiety    Bursitis    leg pain   Carpal tunnel syndrome    Cervical dysplasia    hx LEEP over 18 years ago.    Chronic lymphocytic leukemia (HCC) 2018   Dr Orlie Dakin.  (in lymph nodes)   COPD (chronic obstructive pulmonary disease) (HCC)    COVID-19 2021   Depression    Eating disorder    GERD (gastroesophageal reflux disease)    History of self-harm    Insomnia    Obsession    Tobacco abuse    Vitamin B12 deficiency (non anemic)     Past Surgical History:  Procedure Laterality Date   BREAST BIOPSY Right 01/05/2018   US guided biopsy of 2 areas and 1 lymph node, MIXED INFLAMMATION AND GIANT CELL REACTION   CERVICAL BIOPSY  W/ LOOP ELECTRODE  EXCISION     COLONOSCOPY WITH PROPOFOL N/A 04/21/2019   Procedure: COLONOSCOPY WITH PROPOFOL;  Surgeon: Pasty Spillers, MD;  Location: [facility] ENDOSCOPY;  Service: Gastroenterology;  Laterality: N/A;   OTHER SURGICAL HISTORY     scar tissue removed from vocal cords   TUBAL LIGATION     VAGINAL HYSTERECTOMY N/A 01/22/2021   Procedure: HYSTERECTOMY VAGINAL; BILATERAL SALPINGECTOMY;  Surgeon: Hildred Laser, MD;  Location: [facility] ORS;  Service: Gynecology;  Laterality: N/A;   vocal cord surgery  2005    Allergies: Doxycycline and Duloxetine  Medications: Prior to Admission medications   Medication Sig Start Date End Date Taking? Authorizing Provider  albuterol (VENTOLIN HFA) 108 (90 Base) MCG/ACT inhaler Inhale 2 puffs into the lungs every 6 (six) hours as needed for wheezing or shortness of breath. 08/13/22   Berniece Salines, FNP  ARIPiprazole (ABILIFY) 5 MG tablet Take 1 tablet (5 mg total) by mouth daily. 04/21/23   Alba Cory, MD  Ascorbic Acid (VITAMIN C) 1000 MG tablet Take 1,000 mg by mouth daily as needed (immune health).    [provider]  ASPIRIN 81 PO Take 81 mg by mouth daily.    [provider]  atorvastatin (LIPITOR) 10 MG tablet Take 1 tablet (10 mg total) by mouth in the morning. 07/16/23   Alba Cory, MD  Ca Phosphate-Cholecalciferol (CALTRATE GUMMY BITES) 250-10 MG-MCG CHEW Chew 1 gummy by mouth 2 (two) times daily. 05/23/23   Sowles, Danna Hefty,  MD  cephALEXin (KEFLEX) 500 MG capsule Take 1 capsule (500 mg total) by mouth 2 (two) times daily for 5 days. 09/04/23 09/09/23  Berniece Salines, FNP  chlorpheniramine-HYDROcodone (TUSSIONEX) 10-8 MG/5ML Take 5 mLs by mouth every 12 (twelve) hours as needed for cough. 09/04/23   Berniece Salines, FNP  clobetasol cream (TEMOVATE) 0.05 % Apply topically to bites on lower legs as directed daily up to 5 days a week until clear then use as needed for flares 05/15/22   Deirdre Evener, MD  famotidine (PEPCID) 10 MG tablet  Take by mouth.    [provider]  lenalidomide (REVLIMID) 10 MG capsule TAKE 1 CAPSULE BY MOUTH EVERY DAY 09/01/23   Jeralyn Ruths, MD  loratadine (CLARITIN) 10 MG tablet Take 1 tablet (10 mg total) by mouth daily in the morning. 10/15/22   Alba Cory, MD  Magnesium 500 MG CAPS Take 1 capsule (500 mg total) by mouth daily. 05/26/23   Alba Cory, MD  meloxicam (MOBIC) 15 MG tablet Take 1 tablet (15 mg total) by mouth daily as needed for pain. 07/16/23   Alba Cory, MD  omeprazole (PRILOSEC) 20 MG capsule Take 1 capsule (20 mg total) by mouth daily in the morning. 04/21/23   Alba Cory, MD  pregabalin (LYRICA) 150 MG capsule Take 1 capsule (150 mg total) by mouth 3 (three) times daily. 05/26/23     PREMARIN 0.625 MG tablet TAKE ONE TABLET BY MOUTH ONCE daily FOR 21 DAYS THEN DO not take FOR 7 days 12/16/22   Hildred Laser, MD  tiZANidine (ZANAFLEX) 2 MG tablet Take 1 tablet (2 mg total) by mouth at bedtime. 04/21/23   Alba Cory, MD  umeclidinium-vilanterol (ANORO ELLIPTA) 62.5-25 MCG/ACT AEPB Inhale 1 puff into the lungs daily. 09/08/23   Alba Cory, MD  vitamin B-12 (CYANOCOBALAMIN) 500 MCG tablet Take 250 mcg by mouth every morning. 02/26/21   [provider]     Family History  Problem Relation Age of Onset   Depression Mother    Cancer Mother        thyroid   Alcohol abuse Father    COPD Father    Alcohol abuse Brother    Depression Brother    Bipolar disorder Brother    Suicidality Brother    ADD / ADHD Son    Breast cancer Neg Hx     Social History   Socioeconomic History   Marital status: Divorced    Spouse name: NA   Number of children: 4   Years of education: 12   Highest education level: 12th grade  Occupational History   Occupation: Disabled   Tobacco Use   Smoking status: Every Day    Types: E-cigarettes   Smokeless tobacco: Never  Vaping Use   Vaping status: Former  Substance and Sexual Activity   Alcohol use: Not  Currently    Alcohol/week: 0.0 standard drinks of alcohol    Comment: rarely   Drug use: Yes    Frequency: 7.0 times per week    Types: Marijuana   Sexual activity: Yes    Partners: Male    Birth control/protection: Surgical  Other Topics Concern   Not on file  Social History Narrative   Patient is single Patient is currently on social security disability due to health challenges.     Her teenager son is living with his father    Her father lives in [place] and is on end stage COPD   Social  Drivers of Health   Financial Resource Strain: Low Risk  (11/22/2022)   Overall Financial Resource Strain (CARDIA)    Difficulty of Paying Living Expenses: Not hard at all  Food Insecurity: No Food Insecurity (11/22/2022)   Hunger Vital Sign    Worried About Running Out of Food in the Last Year: Never true    Ran Out of Food in the Last Year: Never true  Transportation Needs: No Transportation Needs (11/22/2022)   PRAPARE - Administrator, Civil Service (Medical): No    Lack of Transportation (Non-Medical): No  Physical Activity: Inactive (11/22/2022)   Exercise Vital Sign    Days of Exercise per Week: 0 days    Minutes of Exercise per Session: 0 min  Stress: Stress Concern Present (11/22/2022)   [employer] of Occupational Health - Occupational Stress Questionnaire    Feeling of Stress : Very much  Social Connections: Socially Isolated (11/22/2022)   Social Connection and Isolation Panel [NHANES]    Frequency of Communication with Friends and Family: More than three times a week    Frequency of Social Gatherings with Friends and Family: Twice a week    Attends Religious Services: Never    Database administrator or Organizations: No    Attends Engineer, structural: Not on file    Marital Status: Divorced    Review of Systems: A 12 point ROS discussed and pertinent positives are indicated in the HPI above.  All other systems are negative.  Review of Systems   Constitutional:  Negative for fatigue and fever.  HENT:  Negative for congestion.   Respiratory:  Negative for cough and shortness of breath.   Gastrointestinal:  Negative for abdominal pain, diarrhea, nausea and vomiting.    Vital Signs: BP 109/61   Pulse 60   Temp 98 F (36.7 C) (Oral)   Resp 17   Ht 5\' 7"  (1.702 m)   Wt 185 lb 3 oz (84 kg)   LMP 08/17/2016   SpO2 100%   BMI 29.00 kg/m     Physical Exam Vitals and nursing note reviewed.  Constitutional:      Appearance: She is well-developed.  HENT:     Head: Normocephalic and atraumatic.     Mouth/Throat:     Mouth: Mucous membranes are dry.  Eyes:     Conjunctiva/sclera: Conjunctivae normal.  Cardiovascular:     Rate and Rhythm: Normal rate.  Pulmonary:     Effort: Pulmonary effort is normal.  Musculoskeletal:        General: Normal range of motion.     Cervical back: Normal range of motion.  Skin:    General: Skin is warm and dry.  Neurological:     General: No focal deficit present.     Mental Status: She is alert and oriented to person, place, and time. Mental status is at baseline.  Psychiatric:        Mood and Affect: Mood normal.        Behavior: Behavior normal.        Thought Content: Thought content normal.        Judgment: Judgment normal.     Imaging: No results found.  Labs:  CBC: Recent Labs    05/07/23 0849 06/19/23 1001 08/18/23 1005 08/27/23 0841  WBC 2.6* 2.8* 1.8* 2.4*  HGB 12.5 13.3 12.1 12.1  HCT 37.2 38.5 34.3* 34.6*  PLT 85* 82* 64* 68*    COAGS: No results for input(s): "  INR", "APTT" in the last 8760 hours.  BMP: Recent Labs    05/07/23 0849 06/19/23 1001 08/18/23 1005 08/27/23 0841  NA 138 135 138 138  K 4.1 3.9 4.0 3.9  CL 108 107 106 105  CO2 23 23 23 25   GLUCOSE 100* 115* 86 96  BUN 18 20 21* 17  CALCIUM 8.4* 8.5* 8.3* 8.9  CREATININE 1.26* 1.35* 1.13* 1.34*  GFRNONAA 51* 47* 58* 47*    LIVER FUNCTION TESTS: Recent Labs    05/07/23 0849  06/19/23 1001 08/18/23 1005 08/27/23 0841  BILITOT 0.6 0.8 1.0 1.0  AST 22 18 313* 19  ALT 23 21 370* 49*  ALKPHOS 70 70 107 101  PROT 5.8* 6.1* 5.7* 5.9*  ALBUMIN 3.6 3.9 3.5 3.8      Assessment and Plan:  52 y.o. female outpatient. History of GERD, COPD, CLL. PET scan from 11.20.24 reads Marked interval enlargement of LEFT periaortic retroperitoneal nodal mass measuring 4.3 cm (image 119/6) compared to 2.5 cm. Increase in metabolic activity of the this enlarged nodal mass with SUV max equal 6.7 (image 118) compared SUV max equal 4.8. Team is requesting a left periaortic retroperitoneal nodal mass for further evaluation of transformation to lymphoma. Case reviewed and approved IR Attending Dr. Irish Lack  PLAN: IR Image Guided Left Periaortic Retroperitoneal Nodal Mass Biopsy  Risks and benefits of Left Periaortic Retroperitoneal Nodal Mass was discussed with the patient and/or patient's family including, but not limited to bleeding, infection, damage to adjacent structures or low yield requiring additional tests.  All of the questions were answered and there is agreement to proceed.  Consent signed and in chart.   Thank you for this interesting consult.  I greatly enjoyed meeting Clinique Schappert and look forward to participating in their care.  A copy of this report was sent to the requesting provider on this date.  Electronically Signed: Alene Mires, NP 09/15/2023, 8:12 AM   I spent a total of  30 Minutes   in face to face in clinical consultation, greater than 50% of which was counseling/coordinating care for left Periaortic Retroperitoneal Nodal Mass biopsy

## 2023-09-12 ENCOUNTER — Other Ambulatory Visit: Payer: Self-pay | Admitting: Physician Assistant

## 2023-09-12 ENCOUNTER — Other Ambulatory Visit (HOSPITAL_COMMUNITY): Payer: Self-pay

## 2023-09-12 DIAGNOSIS — Z01818 Encounter for other preprocedural examination: Secondary | ICD-10-CM

## 2023-09-15 ENCOUNTER — Other Ambulatory Visit: Payer: Self-pay

## 2023-09-15 ENCOUNTER — Other Ambulatory Visit: Payer: Self-pay | Admitting: Obstetrics and Gynecology

## 2023-09-15 ENCOUNTER — Other Ambulatory Visit (HOSPITAL_COMMUNITY): Payer: Self-pay

## 2023-09-15 ENCOUNTER — Ambulatory Visit
Admission: RE | Admit: 2023-09-15 | Discharge: 2023-09-15 | Disposition: A | Discharge status: Home / Self Care | Payer: Medicare PPO | Source: Ambulatory Visit | Attending: Oncology | Admitting: Oncology

## 2023-09-15 DIAGNOSIS — C9110 Chronic lymphocytic leukemia of B-cell type not having achieved remission: Secondary | ICD-10-CM | POA: Insufficient documentation

## 2023-09-15 DIAGNOSIS — K219 Gastro-esophageal reflux disease without esophagitis: Secondary | ICD-10-CM | POA: Diagnosis present

## 2023-09-15 DIAGNOSIS — R590 Localized enlarged lymph nodes: Secondary | ICD-10-CM | POA: Diagnosis present

## 2023-09-15 DIAGNOSIS — Z01818 Encounter for other preprocedural examination: Secondary | ICD-10-CM

## 2023-09-15 DIAGNOSIS — R948 Abnormal results of function studies of other organs and systems: Secondary | ICD-10-CM

## 2023-09-15 DIAGNOSIS — J449 Chronic obstructive pulmonary disease, unspecified: Secondary | ICD-10-CM | POA: Diagnosis present

## 2023-09-15 DIAGNOSIS — R1909 Other intra-abdominal and pelvic swelling, mass and lump: Secondary | ICD-10-CM | POA: Diagnosis not present

## 2023-09-15 DIAGNOSIS — C8596 Non-Hodgkin lymphoma, unspecified, intrapelvic lymph nodes: Secondary | ICD-10-CM | POA: Diagnosis not present

## 2023-09-15 LAB — PROTIME-INR
INR: 1 (ref 0.8–1.2)
Prothrombin Time: 13.3 s (ref 11.4–15.2)

## 2023-09-15 LAB — CBC
HCT: 34 % — ABNORMAL LOW (ref 36.0–46.0)
Hemoglobin: 11.6 g/dL — ABNORMAL LOW (ref 12.0–15.0)
MCH: 35.8 pg — ABNORMAL HIGH (ref 26.0–34.0)
MCHC: 34.1 g/dL (ref 30.0–36.0)
MCV: 104.9 fL — ABNORMAL HIGH (ref 80.0–100.0)
Platelets: 73 10*3/uL — ABNORMAL LOW (ref 150–400)
RBC: 3.24 MIL/uL — ABNORMAL LOW (ref 3.87–5.11)
RDW: 13.2 % (ref 11.5–15.5)
WBC: 3 10*3/uL — ABNORMAL LOW (ref 4.0–10.5)
nRBC: 0 % (ref 0.0–0.2)

## 2023-09-15 MED ORDER — GELATIN ABSORBABLE 12-7 MM EX MISC
1.0000 | Freq: Once | CUTANEOUS | Status: AC
  Administered 2023-09-15: 1 via TOPICAL
  Filled 2023-09-15: qty 1

## 2023-09-15 MED ORDER — FENTANYL CITRATE (PF) 100 MCG/2ML IJ SOLN
INTRAMUSCULAR | Status: AC
  Filled 2023-09-15: qty 2

## 2023-09-15 MED ORDER — MIDAZOLAM HCL 2 MG/2ML IJ SOLN
INTRAMUSCULAR | Status: AC | PRN
  Administered 2023-09-15: .5 mg via INTRAVENOUS
  Administered 2023-09-15: 1 mg via INTRAVENOUS
  Administered 2023-09-15: .5 mg via INTRAVENOUS

## 2023-09-15 MED ORDER — FENTANYL CITRATE (PF) 100 MCG/2ML IJ SOLN
INTRAMUSCULAR | Status: AC | PRN
Start: 2023-09-15 — End: 2023-09-15
  Administered 2023-09-15: 50 ug via INTRAVENOUS
  Administered 2023-09-15 (×2): 25 ug via INTRAVENOUS

## 2023-09-15 MED ORDER — DIPHENHYDRAMINE HCL 50 MG/ML IJ SOLN
INTRAMUSCULAR | Status: AC
  Administered 2023-09-15: 50 mg via INTRAVENOUS
  Filled 2023-09-15: qty 1

## 2023-09-15 MED ORDER — DIPHENHYDRAMINE HCL 50 MG/ML IJ SOLN
50.0000 mg | Freq: Once | INTRAMUSCULAR | Status: AC
  Filled 2023-09-15: qty 1

## 2023-09-15 MED ORDER — LIDOCAINE 1 % OPTIME INJ - NO CHARGE
10.0000 mL | Freq: Once | INTRAMUSCULAR | Status: AC
  Administered 2023-09-15: 10 mL via INTRADERMAL
  Filled 2023-09-15: qty 10

## 2023-09-15 MED ORDER — MIDAZOLAM HCL 2 MG/2ML IJ SOLN
INTRAMUSCULAR | Status: AC
  Filled 2023-09-15: qty 4

## 2023-09-15 MED FILL — Estrogens, Conjugated Tab 0.625 MG: ORAL | 84 days supply | Qty: 63 | Fill #0 | Status: AC

## 2023-09-15 NOTE — Procedures (Signed)
Pre procedural Dx: Retroperitoneal lymphadenopathy Post procedural Dx: Same  Technically successful CT guided biopsy of dominant left sided retroperitoneal mass.   EBL: Trace Complications: None immediate.   Katherina Right, MD Pager #: [phone]

## 2023-09-15 NOTE — Progress Notes (Signed)
Patient clinically stable post CT Retroperitoneal LN biopsy per Dr Grace Isaac, tolerated well. Vitals stable post procedure.denies complaints post procedure. Received Versed 2 mg along with Fentanyl 100 mcg IV for procedure. Report given to Moreen Fowler RN post procedure/18 specials.

## 2023-09-16 ENCOUNTER — Other Ambulatory Visit: Payer: Self-pay

## 2023-09-18 LAB — SURGICAL PATHOLOGY

## 2023-09-22 ENCOUNTER — Other Ambulatory Visit: Payer: Self-pay | Admitting: Family Medicine

## 2023-09-22 ENCOUNTER — Encounter: Payer: Self-pay | Admitting: Oncology

## 2023-09-22 ENCOUNTER — Other Ambulatory Visit: Payer: Self-pay

## 2023-09-22 DIAGNOSIS — I700 Atherosclerosis of aorta: Secondary | ICD-10-CM

## 2023-09-22 DIAGNOSIS — G8929 Other chronic pain: Secondary | ICD-10-CM

## 2023-09-24 ENCOUNTER — Other Ambulatory Visit (HOSPITAL_COMMUNITY): Payer: Self-pay

## 2023-09-24 ENCOUNTER — Encounter: Payer: Self-pay | Admitting: Oncology

## 2023-09-24 ENCOUNTER — Other Ambulatory Visit: Payer: Self-pay | Admitting: Oncology

## 2023-09-24 ENCOUNTER — Other Ambulatory Visit: Payer: Self-pay

## 2023-09-24 DIAGNOSIS — C9110 Chronic lymphocytic leukemia of B-cell type not having achieved remission: Secondary | ICD-10-CM

## 2023-09-24 MED ORDER — PREGABALIN 150 MG PO CAPS
150.0000 mg | ORAL_CAPSULE | Freq: Three times a day (TID) | ORAL | 1 refills | Status: DC
  Filled 2023-09-24: qty 90, 30d supply, fill #0
  Filled 2024-01-06: qty 90, 30d supply, fill #1

## 2023-09-25 ENCOUNTER — Encounter: Payer: Self-pay | Admitting: Oncology

## 2023-09-26 ENCOUNTER — Other Ambulatory Visit: Payer: Self-pay | Admitting: *Deleted

## 2023-09-26 DIAGNOSIS — C9110 Chronic lymphocytic leukemia of B-cell type not having achieved remission: Secondary | ICD-10-CM

## 2023-09-29 ENCOUNTER — Inpatient Hospital Stay (HOSPITAL_BASED_OUTPATIENT_CLINIC_OR_DEPARTMENT_OTHER): Payer: Medicare HMO | Admitting: Oncology

## 2023-09-29 ENCOUNTER — Inpatient Hospital Stay: Payer: Medicare HMO | Attending: Oncology

## 2023-09-29 ENCOUNTER — Inpatient Hospital Stay: Payer: Medicare HMO | Admitting: Pharmacist

## 2023-09-29 ENCOUNTER — Encounter: Payer: Self-pay | Admitting: Oncology

## 2023-09-29 ENCOUNTER — Other Ambulatory Visit: Payer: Self-pay

## 2023-09-29 VITALS — BP 104/57 | HR 69 | Temp 97.4°F | Resp 16 | Ht 67.0 in | Wt 190.0 lb

## 2023-09-29 DIAGNOSIS — F17290 Nicotine dependence, other tobacco product, uncomplicated: Secondary | ICD-10-CM | POA: Insufficient documentation

## 2023-09-29 DIAGNOSIS — F419 Anxiety disorder, unspecified: Secondary | ICD-10-CM | POA: Diagnosis not present

## 2023-09-29 DIAGNOSIS — D709 Neutropenia, unspecified: Secondary | ICD-10-CM | POA: Diagnosis not present

## 2023-09-29 DIAGNOSIS — Z79899 Other long term (current) drug therapy: Secondary | ICD-10-CM | POA: Insufficient documentation

## 2023-09-29 DIAGNOSIS — G629 Polyneuropathy, unspecified: Secondary | ICD-10-CM | POA: Insufficient documentation

## 2023-09-29 DIAGNOSIS — C9110 Chronic lymphocytic leukemia of B-cell type not having achieved remission: Secondary | ICD-10-CM

## 2023-09-29 DIAGNOSIS — Z923 Personal history of irradiation: Secondary | ICD-10-CM | POA: Diagnosis not present

## 2023-09-29 DIAGNOSIS — D696 Thrombocytopenia, unspecified: Secondary | ICD-10-CM | POA: Insufficient documentation

## 2023-09-29 DIAGNOSIS — F32A Depression, unspecified: Secondary | ICD-10-CM | POA: Insufficient documentation

## 2023-09-29 LAB — CBC WITH DIFFERENTIAL/PLATELET
Abs Immature Granulocytes: 0.04 10*3/uL (ref 0.00–0.07)
Basophils Absolute: 0.1 10*3/uL (ref 0.0–0.1)
Basophils Relative: 2 %
Eosinophils Absolute: 0.2 10*3/uL (ref 0.0–0.5)
Eosinophils Relative: 7 %
HCT: 35 % — ABNORMAL LOW (ref 36.0–46.0)
Hemoglobin: 11.9 g/dL — ABNORMAL LOW (ref 12.0–15.0)
Immature Granulocytes: 2 %
Lymphocytes Relative: 48 %
Lymphs Abs: 1.3 10*3/uL (ref 0.7–4.0)
MCH: 35.3 pg — ABNORMAL HIGH (ref 26.0–34.0)
MCHC: 34 g/dL (ref 30.0–36.0)
MCV: 103.9 fL — ABNORMAL HIGH (ref 80.0–100.0)
Monocytes Absolute: 0.4 10*3/uL (ref 0.1–1.0)
Monocytes Relative: 14 %
Neutro Abs: 0.8 10*3/uL — ABNORMAL LOW (ref 1.7–7.7)
Neutrophils Relative %: 27 %
Platelets: 77 10*3/uL — ABNORMAL LOW (ref 150–400)
RBC: 3.37 MIL/uL — ABNORMAL LOW (ref 3.87–5.11)
RDW: 13.7 % (ref 11.5–15.5)
WBC: 2.7 10*3/uL — ABNORMAL LOW (ref 4.0–10.5)
nRBC: 0 % (ref 0.0–0.2)

## 2023-09-29 LAB — COMPREHENSIVE METABOLIC PANEL
ALT: 64 U/L — ABNORMAL HIGH (ref 0–44)
AST: 40 U/L (ref 15–41)
Albumin: 3.7 g/dL (ref 3.5–5.0)
Alkaline Phosphatase: 85 U/L (ref 38–126)
Anion gap: 9 (ref 5–15)
BUN: 18 mg/dL (ref 6–20)
CO2: 24 mmol/L (ref 22–32)
Calcium: 8.4 mg/dL — ABNORMAL LOW (ref 8.9–10.3)
Chloride: 106 mmol/L (ref 98–111)
Creatinine, Ser: 1.32 mg/dL — ABNORMAL HIGH (ref 0.44–1.00)
GFR, Estimated: 48 mL/min — ABNORMAL LOW (ref >60)
Glucose, Bld: 112 mg/dL — ABNORMAL HIGH (ref 70–99)
Potassium: 3.7 mmol/L (ref 3.5–5.1)
Sodium: 139 mmol/L (ref 135–145)
Total Bilirubin: 0.9 mg/dL (ref 0.0–1.2)
Total Protein: 5.6 g/dL — ABNORMAL LOW (ref 6.5–8.1)

## 2023-09-29 NOTE — Progress Notes (Signed)
 Oral Chemotherapy Clinic [facility]  [phone] [phone]  Patient Care Team: Sowles, Krichna, MD as PCP - General (Family Medicine) Jacobo Evalene PARAS, MD as Consulting Physician (Oncology) [name], [facility] (Inactive) as Pharmacist (Pharmacist) [name], RN as Registered Nurse (Oncology) Borders, Fonda SAUNDERS, NP as Nurse Practitioner (Hospice and Palliative Medicine) Lenn Aran, MD as Consulting Physician (Radiation Oncology)   Name of the patient: Monique [name]  [id number]  04/22/1971   Date of visit: 09/29/2023  HPI: Patient is a 53 y.o. female with  progressive CLL, previously treated with ibrutinib . Patient was intolerant to venetoclax  treatment initiation and did not want to retry initiating venetoclax . The plan is to now treat her with rituximab  and lenalidomide . Patient started rituximab  on 02/28/22 and lenalidomide  03/01/22.  On 04/03/22, patient presented to clinic with a rash. Her lenalidomide  was held and she was treated with prednisone  (1 week) and loratadine  continuosly. Rash improved by 04/10/22 f/u appt and she was restarted on lenalidomide .  On 04/28/22, patient called to report her rash had worsened and she stopped the lenalidomide  on 04/23/22. She was stated on another prednisone  taper. She was to taper down to 5mg  daily, then resume her lenalidomide . She resumed her lenalidomide  on 05/16/22. Patient stopped rituximab  after receiving 16 cycles, last dose 05/07/23.  Reason for Consult: Oral chemotherapy follow-up for lenalidomide  therapy.   PAST MEDICAL HISTORY: Past Medical History:  Diagnosis Date   Anxiety    Bursitis    leg pain   Carpal tunnel syndrome    Cervical dysplasia    hx LEEP over 18 years ago.    Chronic lymphocytic leukemia (HCC) 2018   Dr Jacobo.  (in lymph nodes)   COPD (chronic obstructive pulmonary disease) (HCC)    COVID-19 2021   Depression    Eating disorder    GERD (gastroesophageal  reflux disease)    History of self-harm    Insomnia    Obsession    Tobacco abuse    Vitamin B12 deficiency (non anemic)     HEMATOLOGY/ONCOLOGY HISTORY:  Oncology History Overview Note  Patient with recent CLL diagnosis by flow cytometry.  Previously not being treated in observation.   S/p 4 cycles of weekly Rituxan  completing on 02/20/2017 scans from January 2018 revealed minimal progression.   Developed right breast mass in March 2019.  Had diagnostic mammogram performed on 12/26/2017 showed suspicious right breast mass.  Biopsy revealed granulomatous mastitis.  She was treated with doxycycline  100 mg twice daily for 7 days.   Developed worsening adenopathy.  CT chest/abdomen/pelvis and soft tissue neck from May 2019 revealed progressive bulky adenopathy in the chest, abdomen and pelvis compatible with worsening disease.   Dr. Jacobo recommended beginning treatment with Rituxan  and Treanda .  Received first cycle last week.  S/p 6 cycles of Rituxan  and Treanda .  Tolerating well.  Last dose given 03/25/18.   CLL (chronic lymphocytic leukemia) (HCC)  11/24/2016 Initial Diagnosis   CLL (chronic lymphocytic leukemia) (HCC)   01/21/2018 - 03/26/2018 Chemotherapy   The patient had palonosetron  (ALOXI ) injection 0.25 mg, 0.25 mg, Intravenous,  Once, 4 of 5 cycles Administration: 0.25 mg (01/28/2018), 0.25 mg (02/25/2018), 0.25 mg (03/25/2018) riTUXimab  (RITUXAN ) 700 mg in sodium chloride  0.9 % 250 mL (2.1875 mg/mL) infusion, 375 mg/m2 = 700 mg, Intravenous,  Once, 4 of 5 cycles Administration: 700 mg (01/28/2018) bendamustine  (BENDEKA ) 150 mg in sodium chloride  0.9 % 50 mL (2.6786 mg/mL) chemo infusion, 90 mg/m2 = 150 mg, Intravenous,  Once, 4 of 5 cycles Administration: 150 mg (01/28/2018), 150 mg (01/29/2018), 150 mg (02/25/2018), 150 mg (02/26/2018), 150 mg (03/25/2018), 150 mg (03/26/2018)  for chemotherapy treatment.    02/28/2022 - 04/18/2022 Chemotherapy   Patient is on Treatment Plan :  NON-HODGKINS LYMPHOMA Rituximab  q7d     02/28/2022 -  Chemotherapy   Patient is on Treatment Plan : NON-HODGKINS LYMPHOMA Rituximab  q28d     03/12/2022 - 03/12/2022 Chemotherapy   Patient is on Treatment Plan : LYMPHOMA CLL/SLL Venetoclax  ramp up / Venetoclax  + Rituximab  (375/500) q28d x 6 cycles / Venetoclax  q28d x 18 cycles       ALLERGIES:  is allergic to doxycycline  and duloxetine .  MEDICATIONS:  Current Outpatient Medications  Medication Sig Dispense Refill   albuterol  (VENTOLIN  HFA) 108 (90 Base) MCG/ACT inhaler Inhale 2 puffs into the lungs every 6 (six) hours as needed for wheezing or shortness of breath. 8 g 0   ARIPiprazole  (ABILIFY ) 5 MG tablet Take 1 tablet (5 mg total) by mouth daily. 90 tablet 1   Ascorbic Acid (VITAMIN C) 1000 MG tablet Take 1,000 mg by mouth daily as needed (immune health). (Patient not taking: Reported on 09/15/2023)     ASPIRIN  81 PO Take 81 mg by mouth daily.     atorvastatin  (LIPITOR) 10 MG tablet Take 1 tablet (10 mg total) by mouth in the morning. 90 tablet 0   Ca Phosphate-Cholecalciferol (CALTRATE GUMMY BITES) 250-10 MG-MCG CHEW Chew 1 gummy by mouth 2 (two) times daily. 180 tablet 3   chlorpheniramine-HYDROcodone  (TUSSIONEX) 10-8 MG/5ML Take 5 mLs by mouth every 12 (twelve) hours as needed for cough. (Patient not taking: Reported on 09/29/2023) 115 mL 0   clobetasol  cream (TEMOVATE ) 0.05 % Apply topically to bites on lower legs as directed daily up to 5 days a week until clear then use as needed for flares (Patient not taking: Reported on 09/15/2023) 45 g 1   estrogens , conjugated, (PREMARIN ) 0.625 MG tablet Take 1 tablet (0.625 mg total) by mouth daily for 21 days, then do not take for 7 days. 90 tablet 0   famotidine  (PEPCID ) 10 MG tablet Take by mouth.     lenalidomide  (REVLIMID ) 10 MG capsule TAKE 1 CAPSULE BY MOUTH EVERY DAY 28 capsule 0   loratadine  (CLARITIN ) 10 MG tablet Take 1 tablet (10 mg total) by mouth daily in the morning. 90 tablet 1    Magnesium  500 MG CAPS Take 1 capsule (500 mg total) by mouth daily. 90 capsule PRN   meloxicam  (MOBIC ) 15 MG tablet Take 1 tablet (15 mg total) by mouth daily as needed for pain. 90 tablet 0   omeprazole  (PRILOSEC) 20 MG capsule Take 1 capsule (20 mg total) by mouth daily in the morning. 150 capsule 0   pregabalin  (LYRICA ) 150 MG capsule Take 1 capsule (150 mg total) by mouth 3 (three) times daily. 90 capsule 1   tiZANidine  (ZANAFLEX ) 2 MG tablet Take 1 tablet (2 mg total) by mouth at bedtime. 90 tablet 1   umeclidinium-vilanterol (ANORO ELLIPTA ) 62.5-25 MCG/ACT AEPB Inhale 1 puff into the lungs daily. 60 each 1   vitamin B-12 (CYANOCOBALAMIN ) 500 MCG tablet Take 250 mcg by mouth every morning.     No current facility-administered medications for this visit.    VITAL SIGNS: LMP 08/17/2016  There were no vitals filed for this visit.  Estimated body mass index is 29.76 kg/m as calculated from the following:   Height as of an earlier encounter on 09/29/23:  5' 7 (1.702 m).   Weight as of an earlier encounter on 09/29/23: 86.2 kg (190 lb).  LABS: CBC:    Component Value Date/Time   WBC 2.7 (L) 09/29/2023 1026   HGB 11.9 (L) 09/29/2023 1026   HCT 35.0 (L) 09/29/2023 1026   PLT 77 (L) 09/29/2023 1026   MCV 103.9 (H) 09/29/2023 1026   NEUTROABS 0.8 (L) 09/29/2023 1026   LYMPHSABS 1.3 09/29/2023 1026   MONOABS 0.4 09/29/2023 1026   EOSABS 0.2 09/29/2023 1026   BASOSABS 0.1 09/29/2023 1026   Comprehensive Metabolic Panel:    Component Value Date/Time   NA 139 09/29/2023 1026   NA 138 05/13/2018 1413   K 3.7 09/29/2023 1026   CL 106 09/29/2023 1026   CO2 24 09/29/2023 1026   BUN 18 09/29/2023 1026   BUN 15 05/13/2018 1413   CREATININE 1.32 (H) 09/29/2023 1026   CREATININE 1.35 (H) 06/19/2023 1001   CREATININE 0.96 12/06/2020 1646   GLUCOSE 112 (H) 09/29/2023 1026   CALCIUM  8.4 (L) 09/29/2023 1026   AST 40 09/29/2023 1026   AST 18 06/19/2023 1001   ALT 64 (H) 09/29/2023 1026    ALT 21 06/19/2023 1001   ALKPHOS 85 09/29/2023 1026   BILITOT 0.9 09/29/2023 1026   BILITOT 0.8 06/19/2023 1001   PROT 5.6 (L) 09/29/2023 1026   PROT 6.3 05/13/2018 1413   ALBUMIN 3.7 09/29/2023 1026   ALBUMIN 4.1 05/13/2018 1413     Present during today's visit: patient only  Assessment and Plan: CBC/CMP reviewed, continue lenalidomide  10mg  daily Recent PET imaging (08/06/23) showed significant increase in metabolic activity in periaortic retroperitoneal nodal mass. 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 Patient will see radiation oncology for consideration of radiation to periaortic retroperitoneal nodal mass Will continue lenalidomide  as CLL treatment for now    Oral Chemotherapy Side Effect/Intolerance:  Currently no reported lenalidomide  related side effects  Oral Chemotherapy Adherence: No missed doses reported  New medications: None reported.  Medication Access Issues: None reported.  Patient expressed understanding and was in agreement with this plan. She also understands that She can call clinic at any time with any questions, concerns, or complaints.   Follow-up plan: RTC as scheduled  Thank you for allowing me to participate in the care of this very pleasant patient.   Time Total: 15 minutes  Visit consisted of counseling and education on dealing with issues of symptom management in the setting of serious and potentially life-threatening illness.Greater than 50%  of this time was spent counseling and coordinating care related to the above assessment and plan.  Signed by: Denessa Cavan N. Irva Loser, PharmD, BCPS, NEILA, CPP Hematology/Oncology Clinical Pharmacist Practitioner [place]/DB/AP Oral Chemotherapy Navigation [facility] [phone]  09/29/2023 12:02 PM

## 2023-09-29 NOTE — Progress Notes (Signed)
 [facility]  [phone] [phone]  ID: Monique [name] OB: December 22, 1970  MR#: [id number]  RDW#:[id number]  Patient Care Team: Sowles, Krichna, MD as PCP - General (Family Medicine) Jacobo Evalene PARAS, MD as Consulting Physician (Oncology) [name], [facility] (Inactive) as Pharmacist (Pharmacist) [name], RN as Registered Nurse (Oncology) Borders, Fonda SAUNDERS, NP as Nurse Practitioner (Hospice and Palliative Medicine) Lenn Aran, MD as Consulting Physician (Radiation Oncology)  CHIEF COMPLAINT: CLL.  INTERVAL HISTORY: Patient returns to clinic today for further evaluation, discussion of her biopsy results, and continuation of Revlimid .  She continues to feel well.  She is tolerating Revlimid  without significant side effects. She does not complain of weakness or fatigue today.  She continues to have a chronic peripheral neuropathy. She has no other neurologic complaints.  She has a good appetite.  She has no chest pain, shortness of breath, cough, or hemoptysis.  She denies any nausea, vomiting, or constipation.  She has no melena or hematochezia.  She has no urinary complaints.  Patient offers no further specific complaints today.  REVIEW OF SYSTEMS:   Review of Systems  Constitutional: Negative.  Negative for diaphoresis, fever, malaise/fatigue and weight loss.  HENT:  Negative for congestion.   Respiratory: Negative.  Negative for cough and shortness of breath.   Cardiovascular: Negative.  Negative for chest pain and leg swelling.  Gastrointestinal: Negative.  Negative for abdominal pain, constipation and diarrhea.  Genitourinary: Negative.  Negative for dysuria and frequency.  Musculoskeletal: Negative.  Negative for back pain, myalgias and neck pain.  Skin: Negative.  Negative for rash.  Neurological:  Positive for tingling and sensory change. Negative for focal weakness and weakness.  Psychiatric/Behavioral: Negative.   Negative for depression and suicidal ideas. The patient is not nervous/anxious.     As per HPI. Otherwise, a complete review of systems is negative.  PAST MEDICAL HISTORY: Past Medical History:  Diagnosis Date   Anxiety    Bursitis    leg pain   Carpal tunnel syndrome    Cervical dysplasia    hx LEEP over 18 years ago.    Chronic lymphocytic leukemia (HCC) 2018   Dr Jacobo.  (in lymph nodes)   COPD (chronic obstructive pulmonary disease) (HCC)    COVID-19 2021   Depression    Eating disorder    GERD (gastroesophageal reflux disease)    History of self-harm    Insomnia    Obsession    Tobacco abuse    Vitamin B12 deficiency (non anemic)     PAST SURGICAL HISTORY: Past Surgical History:  Procedure Laterality Date   BREAST BIOPSY Right 01/05/2018   US  guided biopsy of 2 areas and 1 lymph node, MIXED INFLAMMATION AND GIANT CELL REACTION   CERVICAL BIOPSY  W/ LOOP ELECTRODE EXCISION     COLONOSCOPY WITH PROPOFOL  N/A 04/21/2019   Procedure: COLONOSCOPY WITH PROPOFOL ;  Surgeon: Janalyn Keene NOVAK, MD;  Location: [facility] ENDOSCOPY;  Service: Gastroenterology;  Laterality: N/A;   OTHER SURGICAL HISTORY     scar tissue removed from vocal cords   TUBAL LIGATION     VAGINAL HYSTERECTOMY N/A 01/22/2021   Procedure: HYSTERECTOMY VAGINAL; BILATERAL SALPINGECTOMY;  Surgeon: Connell Davies, MD;  Location: [facility] ORS;  Service: Gynecology;  Laterality: N/A;   vocal cord surgery  2005    FAMILY HISTORY: Family History  Problem Relation Age of Onset   Depression Mother    Cancer Mother        thyroid  Alcohol abuse Father    COPD Father    Alcohol abuse Brother    Depression Brother    Bipolar disorder Brother    Suicidality Brother    ADD / ADHD Son    Breast cancer Neg Hx     ADVANCED DIRECTIVES (Y/N):  N  HEALTH MAINTENANCE: Social History   Tobacco Use   Smoking status: Every Day    Types: E-cigarettes   Smokeless tobacco: Never  Vaping Use   Vaping status: Former   Substance Use Topics   Alcohol use: Not Currently    Alcohol/week: 0.0 standard drinks of alcohol    Comment: rarely   Drug use: Yes    Frequency: 7.0 times per week    Types: Marijuana     Colonoscopy:  PAP:  Bone density:  Lipid panel:  Allergies  Allergen Reactions   Doxycycline  Rash    Rash on cheeks and sides of nose    Duloxetine  Rash    Current Outpatient Medications  Medication Sig Dispense Refill   albuterol  (VENTOLIN  HFA) 108 (90 Base) MCG/ACT inhaler Inhale 2 puffs into the lungs every 6 (six) hours as needed for wheezing or shortness of breath. 8 g 0   ARIPiprazole  (ABILIFY ) 5 MG tablet Take 1 tablet (5 mg total) by mouth daily. 90 tablet 1   ASPIRIN  81 PO Take 81 mg by mouth daily.     atorvastatin  (LIPITOR) 10 MG tablet Take 1 tablet (10 mg total) by mouth in the morning. 90 tablet 0   Ca Phosphate-Cholecalciferol (CALTRATE GUMMY BITES) 250-10 MG-MCG CHEW Chew 1 gummy by mouth 2 (two) times daily. 180 tablet 3   estrogens , conjugated, (PREMARIN ) 0.625 MG tablet Take 1 tablet (0.625 mg total) by mouth daily for 21 days, then do not take for 7 days. 90 tablet 0   famotidine  (PEPCID ) 10 MG tablet Take by mouth.     lenalidomide  (REVLIMID ) 10 MG capsule TAKE 1 CAPSULE BY MOUTH EVERY DAY 28 capsule 0   loratadine  (CLARITIN ) 10 MG tablet Take 1 tablet (10 mg total) by mouth daily in the morning. 90 tablet 1   Magnesium  500 MG CAPS Take 1 capsule (500 mg total) by mouth daily. 90 capsule PRN   meloxicam  (MOBIC ) 15 MG tablet Take 1 tablet (15 mg total) by mouth daily as needed for pain. 90 tablet 0   omeprazole  (PRILOSEC) 20 MG capsule Take 1 capsule (20 mg total) by mouth daily in the morning. 150 capsule 0   pregabalin  (LYRICA ) 150 MG capsule Take 1 capsule (150 mg total) by mouth 3 (three) times daily. 90 capsule 1   tiZANidine  (ZANAFLEX ) 2 MG tablet Take 1 tablet (2 mg total) by mouth at bedtime. 90 tablet 1   umeclidinium-vilanterol (ANORO ELLIPTA ) 62.5-25 MCG/ACT  AEPB Inhale 1 puff into the lungs daily. 60 each 1   vitamin B-12 (CYANOCOBALAMIN ) 500 MCG tablet Take 250 mcg by mouth every morning.     Ascorbic Acid (VITAMIN C) 1000 MG tablet Take 1,000 mg by mouth daily as needed (immune health). (Patient not taking: Reported on 09/15/2023)     chlorpheniramine-HYDROcodone  (TUSSIONEX) 10-8 MG/5ML Take 5 mLs by mouth every 12 (twelve) hours as needed for cough. (Patient not taking: Reported on 09/29/2023) 115 mL 0   clobetasol  cream (TEMOVATE ) 0.05 % Apply topically to bites on lower legs as directed daily up to 5 days a week until clear then use as needed for flares (Patient not taking: Reported on 09/15/2023) 45 g 1  No current facility-administered medications for this visit.    OBJECTIVE: Vitals:   09/29/23 1046  BP: (!) 104/57  Pulse: 69  Resp: 16  Temp: (!) 97.4 F (36.3 C)  SpO2: 100%       Body mass index is 29.76 kg/m.    ECOG FS:0 - Asymptomatic  General: Well-developed, well-nourished, no acute distress. Eyes: Pink conjunctiva, anicteric sclera. HEENT: Normocephalic, moist mucous membranes. Lungs: No audible wheezing or coughing. Heart: Regular rate and rhythm. Abdomen: Soft, nontender, no obvious distention. Musculoskeletal: No edema, cyanosis, or clubbing. Neuro: Alert, answering all questions appropriately. Cranial nerves grossly intact. Skin: No rashes or petechiae noted. Psych: Normal affect.  LAB RESULTS:  Lab Results  Component Value Date   NA 139 09/29/2023   K 3.7 09/29/2023   CL 106 09/29/2023   CO2 24 09/29/2023   GLUCOSE 112 (H) 09/29/2023   BUN 18 09/29/2023   CREATININE 1.32 (H) 09/29/2023   CALCIUM  8.4 (L) 09/29/2023   PROT 5.6 (L) 09/29/2023   ALBUMIN 3.7 09/29/2023   AST 40 09/29/2023   ALT 64 (H) 09/29/2023   ALKPHOS 85 09/29/2023   BILITOT 0.9 09/29/2023   GFRNONAA 48 (L) 09/29/2023   GFRAA 79 12/06/2020    Lab Results  Component Value Date   WBC 2.7 (L) 09/29/2023   NEUTROABS 0.8 (L)  09/29/2023   HGB 11.9 (L) 09/29/2023   HCT 35.0 (L) 09/29/2023   MCV 103.9 (H) 09/29/2023   PLT 77 (L) 09/29/2023     STUDIES: CT ABDOMINAL MASS BIOPSY Result Date: 09/15/2023 INDICATION: History of CLL, now with hypermetabolic retroperitoneal lymphadenopathy. Please perform CT-guided biopsy for tissue diagnostic purposes. EXAM: CT-GUIDED LEFT SIDED RETROPERITONEAL NODAL BIOPSY COMPARISON:  PET-CT-08/06/2023 MEDICATIONS: None. ANESTHESIA/SEDATION: Moderate (conscious) sedation was employed during this procedure as administered by the Interventional Radiology RN. A total of Versed  2 mg and Fentanyl  100 mcg was administered intravenously. Moderate Sedation Time: 12 minutes. The patient's level of consciousness and vital signs were monitored continuously by radiology nursing throughout the procedure under my direct supervision. CONTRAST:  None. COMPLICATIONS: None immediate. PROCEDURE: Informed consent was obtained from the patient following an explanation of the procedure, risks, benefits and alternatives. A time out was performed prior to the initiation of the procedure. The patient was positioned prone on the CT table and a limited CT was performed for procedural planning demonstrating similar appearance of known dominant left-sided retroperitoneal nodal mass measuring 6.4 x 5.2 cm (image 28, series 4). The procedure was planned. The operative site was prepped and draped in the usual sterile fashion. Appropriate trajectory was confirmed with a 22 gauge spinal needle after the adjacent tissues were anesthetized with 1% Lidocaine  with epinephrine . Under intermittent CT guidance, a 17 gauge coaxial needle was advanced into the peripheral aspect of the mass. Appropriate positioning was confirmed and 7 core needle biopsy samples were obtained with an 18 gauge core needle biopsy device. The co-axial needle was removed following the administration of a Gel-Foam slurry and superficial hemostasis was achieved with  manual compression. A limited postprocedural CT was negative for hemorrhage or additional complication. A dressing was placed. The patient tolerated the procedure well without immediate postprocedural complication. IMPRESSION: Technically successful CT guided core needle biopsy of dominant hypermetabolic left-sided retroperitoneal nodal mass. Electronically Signed   By: Norleen Roulette M.D.   On: 09/15/2023 14:41    ONCOLOGY HISTORY:  Patient completed cycle 3 of Rituxan  plus Treanda  on March 26, 2018, but was then noted to have progressive  disease.  She was then initiated on 480 mg Imbruvica  in November 2019, but had progression of disease with increasing white blood cell count as well as CT scan on Jan 22, 2022 revealing increased lymphadenopathy consistent with progression of disease.  Attempted patient on venetoclax , but she could not tolerate it secondary to persistent diarrhea and transaminitis.   ASSESSMENT: CLL.  PLAN:    CLL: Confirmed by peripheral blood flow cytometry.  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  Patient initially completed 4 weekly cycles of Rituxan  then was transition to treatment every 4 weeks.  She last received Rituxan  on May 07, 2023.  She also recently completed XRT to her left axilla.  PET scan results from August 06, 2023 reviewed independently and also discussed at tumor board with interventional radiology.  Biopsy completed of hypermetabolic mass was consistent with her known diagnosis of CLL with no evidence of aggressive transformation.  A referral was sent back to radiation oncology for XRT of this lesion.  Patient has been instructed to continue Claritin  and famotidine  along with her 10 mg Revlimid  daily.  Patient has discontinued prednisone .  Return to clinic as previously scheduled for repeat laboratory work and further evaluation.  Appreciate clinical pharmacy input. Neutropenia: Chronic  and unchanged.  Patient's most recent ANC is 0.8.  Monitor.   Thrombocytopenia: Chronic and unchanged.  Patient's most recent platelet count is 77. Monitor. Trigeminal neuralgia: Resolved.  MRI of the brain on Jan 25, 2019 was unremarkable.   Depression/anxiety: Chronic and unchanged.  Continue follow-up and treatment per primary care. Neuropathic pain/peripheral neuropathy: Chronic and unchanged. She reports she is now on disability.  Continue current follow-up and treatment as per neurology. Rituxan  reaction: Patient will require additional premedications for preventative measures if Rituxan  is reinitiated in the future. Rash: Resolved.  Continue Claritin  and famotidine  along with Revlimid .  Weight gain: Patient has discontinued prednisone . Transaminitis: Unclear etiology, but essentially resolved.    I spent a total of 30 minutes reviewing chart data, face-to-face evaluation with the patient, counseling and coordination of care as detailed above.   Patient expressed understanding and was in agreement with this plan. She also understands that She can call clinic at any time with any questions, concerns, or complaints.    Evalene JINNY Reusing, MD   09/29/2023 3:23 PM

## 2023-09-30 ENCOUNTER — Other Ambulatory Visit: Payer: Self-pay

## 2023-09-30 ENCOUNTER — Other Ambulatory Visit (HOSPITAL_COMMUNITY): Payer: Self-pay

## 2023-10-01 ENCOUNTER — Encounter: Payer: Self-pay | Admitting: Oncology

## 2023-10-01 ENCOUNTER — Telehealth: Payer: Self-pay | Admitting: Family Medicine

## 2023-10-01 ENCOUNTER — Other Ambulatory Visit: Payer: Self-pay

## 2023-10-01 NOTE — Telephone Encounter (Signed)
 What do we need to do?

## 2023-10-01 NOTE — Telephone Encounter (Signed)
 Copied from CRM [id number]. Topic: General - Other >> Oct 01, 2023 11:10 AM Everette C wrote: Reason for CRM: The patient has been directed by their pharmacy to contact their PCP and follow up on previous refill requests of   Rx #: [id number]  atorvastatin  (LIPITOR) 10 MG tablet [[id number]]   Rx #: [id number]  meloxicam  (MOBIC ) 15 MG tablet [[id number]]   Rx #: [id number]  Magnesium  500 MG CAPS [[id number]]   Rx #: [id number]  omeprazole  (PRILOSEC) 20 MG capsule [[id number]]   Please contact the patient further when available

## 2023-10-01 NOTE — Telephone Encounter (Signed)
 Previously Short supply given (65month) only some

## 2023-10-01 NOTE — Telephone Encounter (Signed)
 Appt made for tomorrow 10-01-2023

## 2023-10-01 NOTE — Telephone Encounter (Signed)
 Pt needs appt.

## 2023-10-02 ENCOUNTER — Encounter: Payer: Self-pay | Admitting: Oncology

## 2023-10-02 ENCOUNTER — Other Ambulatory Visit: Payer: Self-pay

## 2023-10-02 ENCOUNTER — Ambulatory Visit (INDEPENDENT_AMBULATORY_CARE_PROVIDER_SITE_OTHER): Payer: Medicare HMO | Admitting: Family Medicine

## 2023-10-02 ENCOUNTER — Encounter: Payer: Self-pay | Admitting: Family Medicine

## 2023-10-02 ENCOUNTER — Other Ambulatory Visit (HOSPITAL_COMMUNITY): Payer: Self-pay

## 2023-10-02 VITALS — BP 110/70 | HR 86 | Temp 97.4°F | Resp 16 | Ht 67.0 in | Wt 187.7 lb

## 2023-10-02 DIAGNOSIS — E538 Deficiency of other specified B group vitamins: Secondary | ICD-10-CM

## 2023-10-02 DIAGNOSIS — N1831 Chronic kidney disease, stage 3a: Secondary | ICD-10-CM | POA: Diagnosis not present

## 2023-10-02 DIAGNOSIS — E559 Vitamin D deficiency, unspecified: Secondary | ICD-10-CM

## 2023-10-02 DIAGNOSIS — I739 Peripheral vascular disease, unspecified: Secondary | ICD-10-CM

## 2023-10-02 DIAGNOSIS — Z113 Encounter for screening for infections with a predominantly sexual mode of transmission: Secondary | ICD-10-CM

## 2023-10-02 DIAGNOSIS — J432 Centrilobular emphysema: Secondary | ICD-10-CM

## 2023-10-02 DIAGNOSIS — I700 Atherosclerosis of aorta: Secondary | ICD-10-CM | POA: Diagnosis not present

## 2023-10-02 DIAGNOSIS — M5441 Lumbago with sciatica, right side: Secondary | ICD-10-CM

## 2023-10-02 DIAGNOSIS — D692 Other nonthrombocytopenic purpura: Secondary | ICD-10-CM | POA: Diagnosis not present

## 2023-10-02 DIAGNOSIS — D61818 Other pancytopenia: Secondary | ICD-10-CM

## 2023-10-02 DIAGNOSIS — M5442 Lumbago with sciatica, left side: Secondary | ICD-10-CM

## 2023-10-02 DIAGNOSIS — F325 Major depressive disorder, single episode, in full remission: Secondary | ICD-10-CM | POA: Diagnosis not present

## 2023-10-02 DIAGNOSIS — C9110 Chronic lymphocytic leukemia of B-cell type not having achieved remission: Secondary | ICD-10-CM | POA: Diagnosis not present

## 2023-10-02 DIAGNOSIS — G8929 Other chronic pain: Secondary | ICD-10-CM

## 2023-10-02 MED ORDER — ARIPIPRAZOLE 5 MG PO TABS
5.0000 mg | ORAL_TABLET | Freq: Every day | ORAL | 1 refills | Status: DC
  Filled 2023-10-02: qty 30, 30d supply, fill #0
  Filled 2023-10-02: qty 90, 90d supply, fill #0
  Filled 2023-10-24 – 2023-10-29 (×2): qty 30, 30d supply, fill #1
  Filled 2023-11-17 – 2023-11-25 (×2): qty 30, 30d supply, fill #2
  Filled 2023-12-04 – 2023-12-18 (×2): qty 30, 30d supply, fill #3
  Filled 2024-01-05 – 2024-01-13 (×2): qty 30, 30d supply, fill #4
  Filled 2024-02-02 – 2024-02-17 (×2): qty 30, 30d supply, fill #5

## 2023-10-02 MED ORDER — ATORVASTATIN CALCIUM 10 MG PO TABS
10.0000 mg | ORAL_TABLET | Freq: Every morning | ORAL | 1 refills | Status: DC
  Filled 2023-10-02: qty 90, 90d supply, fill #0
  Filled 2023-10-02: qty 30, 30d supply, fill #0
  Filled 2023-10-24 – 2023-10-29 (×2): qty 30, 30d supply, fill #1
  Filled 2023-11-17 – 2023-11-25 (×2): qty 30, 30d supply, fill #2
  Filled 2023-12-04 – 2023-12-18 (×2): qty 30, 30d supply, fill #3
  Filled 2024-01-05 – 2024-01-13 (×2): qty 30, 30d supply, fill #4

## 2023-10-02 MED ORDER — DICLOFENAC EPOLAMINE 1.3 % EX PTCH
1.0000 | MEDICATED_PATCH | Freq: Two times a day (BID) | CUTANEOUS | 0 refills | Status: DC
  Filled 2023-10-02 – 2023-10-03 (×2): qty 60, 30d supply, fill #0

## 2023-10-02 MED ORDER — OMEPRAZOLE 20 MG PO CPDR
20.0000 mg | DELAYED_RELEASE_CAPSULE | Freq: Every morning | ORAL | 1 refills | Status: DC
  Filled 2023-10-02: qty 90, 90d supply, fill #0
  Filled 2023-10-02 – 2023-10-06 (×8): qty 30, 30d supply, fill #0
  Filled 2023-10-24 – 2023-10-29 (×3): qty 30, 30d supply, fill #1

## 2023-10-02 MED ORDER — UMECLIDINIUM-VILANTEROL 62.5-25 MCG/ACT IN AEPB
1.0000 | INHALATION_SPRAY | Freq: Every day | RESPIRATORY_TRACT | 1 refills | Status: DC
  Filled 2023-10-02: qty 180, 180d supply, fill #0
  Filled 2023-11-01: qty 180, 90d supply, fill #0
  Filled 2024-01-05 – 2024-01-19 (×2): qty 180, 90d supply, fill #1

## 2023-10-02 MED ORDER — TIZANIDINE HCL 2 MG PO TABS
2.0000 mg | ORAL_TABLET | Freq: Every day | ORAL | 1 refills | Status: DC
  Filled 2023-10-02: qty 30, 30d supply, fill #0
  Filled 2023-10-02: qty 90, 90d supply, fill #0
  Filled 2023-10-24 – 2023-10-29 (×2): qty 30, 30d supply, fill #1
  Filled 2023-11-17 – 2023-11-25 (×2): qty 30, 30d supply, fill #2
  Filled 2023-12-04 – 2023-12-18 (×2): qty 30, 30d supply, fill #3
  Filled 2024-01-05 – 2024-01-13 (×2): qty 30, 30d supply, fill #4
  Filled 2024-02-02 – 2024-02-17 (×2): qty 30, 30d supply, fill #5

## 2023-10-02 NOTE — Progress Notes (Signed)
Name: Carolanne Francisco   MRN: [id number]    DOB: [birth year]   Date:10/02/2023       Progress Note  Subjective  Chief Complaint  Chief Complaint  Patient presents with   Medical Management of Chronic Issues   HPI   CLL: diagnosed in 2017  seeing oncologist, Dr. Orlie Dakin , she is on oral Revlimid, no longer on Imbruvica she has a new rash on legs that seems secondary to medication. She has pancytopenia but denies bleeding on mouth or urine/stools. Takes medication with pepcid and loratadine as recommended by Dr. Orlie Dakin   Low back pain and radiculitis/neuropahty : she is on Lyrica, Tizanidine and Meloxicam  , she was seeing Dr. Shireen Quan  Pain is no longer constant, she is now seeing psyatrist at [facility]. Kidney has dropped, advised to stop meloxicam, take tylenol prn and we will try adding Flector patch    Emphysema: she is still smoking, she is down to about 5 cigarettes per day, but is vaping more. She has daily cough,  no wheezing but has intermittent sob -usually with moderate activity. She uses Anoro daily    Atherosclerosis Aorta and dyslipidemia : she has been taking Atorvastatin and no side effects. Last LDL was at goal,we will recheck labs    Carpal Tunnel: had NCS done by Dr. 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 She has normal urine output  MDD: she is currently taking Abilify and has boyfriend, doing well emotionally. She wants to continue medications She gets board but not sad lately    GERD: well controlled with PPI, takes pepcid with medication given by Dr. Orlie Dakin  Vitamin D and B12 deficiency: not currently taking supplementation and we will recheck level   PAD: seen by vascular surgeon and  advised to continue statin therapy, use compression socks and increase physical activity. She states still very sedentary. 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/09/2020   Bilateral leg pain 11/03/2019   Iron deficiency anemia 10/15/2019   Bilateral arm pain 08/05/2019   Bilateral hand numbness 08/05/2019   Weakness of both hands 08/05/2019   Chronic musculoskeletal pain 07/19/2019   Chronic neuropathic pain 07/19/2019   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   Special screening for malignant neoplasms, colon    Polyp of sigmoid colon    Hemorrhoids    Trigeminal neuralgia 10/05/2018   Lumbar radiculitis (Right) 09/24/2018   Hypocalcemia 06/03/2018   Vitamin D insufficiency 06/03/2018   Abnormal MRI, lumbar spine (06/02/2020) 06/03/2018   Chronic hip pain (Right) 06/03/2018   Spondylosis without myelopathy or radiculopathy, lumbar region 06/03/2018   DDD (degenerative disc disease), lumbar 06/02/2018   Lumbar facet hypertrophy 06/02/2018   Lumbar facet arthropathy 06/02/2018  Lumbar facet syndrome (Bilateral) (R>L) 06/02/2018   Marijuana use 05/19/2018   Chronic lower extremity pain (1ry area of Pain) (Bilateral) (R>L) 05/13/2018   Chronic low back pain (2ry area of Pain) (Bilateral) (R>L) w/ sciatica (Bilateral) 05/13/2018   Chronic pain syndrome 05/13/2018   Disorder of skeletal system 05/13/2018   Pharmacologic therapy 05/13/2018    Problems influencing health status 05/13/2018   Mastalgia 05/05/2018   Breast mass, right 01/07/2018   Face pain 12/11/2017   Tingling 12/11/2017   Thoracic aortic atherosclerosis (HCC) 08/20/2017   Emphysema of lung (HCC) 08/20/2017   Adductor tendinitis 04/23/2017   Trochanteric bursitis of right hip 04/23/2017   GERD without esophagitis 12/11/2016   CLL (chronic lymphocytic leukemia) (HCC) 11/24/2016   Stress incontinence 09/04/2016   B12 deficiency 07/10/2015   Insomnia 07/10/2015   Anorexia nervosa, restricting type 07/10/2015   Anxiety, generalized 07/10/2015   H/O suicide attempt 07/10/2015   Lymphocytosis 07/10/2015   Obsessive-compulsive disorder 07/10/2015   Tobacco use 07/10/2015   History of cervical dysplasia 07/18/2014    Past Surgical History:  Procedure Laterality Date   BREAST BIOPSY Right 01/05/2018   US guided biopsy of 2 areas and 1 lymph node, MIXED INFLAMMATION AND GIANT CELL REACTION   CERVICAL BIOPSY  W/ LOOP ELECTRODE EXCISION     COLONOSCOPY WITH PROPOFOL N/A 04/21/2019   Procedure: COLONOSCOPY WITH PROPOFOL;  Surgeon: Pasty Spillers, MD;  Location: [facility] ENDOSCOPY;  Service: Gastroenterology;  Laterality: N/A;   OTHER SURGICAL HISTORY     scar tissue removed from vocal cords   TUBAL LIGATION     VAGINAL HYSTERECTOMY N/A 01/22/2021   Procedure: HYSTERECTOMY VAGINAL; BILATERAL SALPINGECTOMY;  Surgeon: Hildred Laser, MD;  Location: [facility] ORS;  Service: Gynecology;  Laterality: N/A;   vocal cord surgery  2005    Family History  Problem Relation Age of Onset   Depression Mother    Cancer Mother        thyroid   Alcohol abuse Father    COPD Father    Alcohol abuse Brother    Depression Brother    Bipolar disorder Brother    Suicidality Brother    ADD / ADHD Son    Breast cancer Neg Hx     Social History   Tobacco Use   Smoking status: Every Day    Types: E-cigarettes   Smokeless tobacco: Never  Substance Use Topics   Alcohol use: Not  Currently    Alcohol/week: 0.0 standard drinks of alcohol    Comment: rarely     Current Outpatient Medications:    albuterol (VENTOLIN HFA) 108 (90 Base) MCG/ACT inhaler, Inhale 2 puffs into the lungs every 6 (six) hours as needed for wheezing or shortness of breath., Disp: 8 g, Rfl: 0   ASPIRIN 81 PO, Take 81 mg by mouth daily., Disp: , Rfl:    atorvastatin (LIPITOR) 10 MG tablet, Take 1 tablet (10 mg total) by mouth in the morning., Disp: 90 tablet, Rfl: 0   Ca Phosphate-Cholecalciferol (CALTRATE GUMMY BITES) 250-10 MG-MCG CHEW, Chew 1 gummy by mouth 2 (two) times daily., Disp: 180 tablet, Rfl: 3   estrogens, conjugated, (PREMARIN) 0.625 MG tablet, Take 1 tablet (0.625 mg total) by mouth daily for 21 days, then do not take for 7 days., Disp: 90 tablet, Rfl: 0   famotidine (PEPCID) 10 MG tablet, Take by mouth., Disp: , Rfl:    lenalidomide (REVLIMID) 10 MG capsule, TAKE 1 CAPSULE BY MOUTH EVERY DAY, Disp: 28 capsule,  Rfl: 0   loratadine (CLARITIN) 10 MG tablet, Take 1 tablet (10 mg total) by mouth daily in the morning., Disp: 90 tablet, Rfl: 1   meloxicam (MOBIC) 15 MG tablet, Take 1 tablet (15 mg total) by mouth daily as needed for pain., Disp: 90 tablet, Rfl: 0   omeprazole (PRILOSEC) 20 MG capsule, Take 1 capsule (20 mg total) by mouth daily in the morning., Disp: 150 capsule, Rfl: 0   pregabalin (LYRICA) 150 MG capsule, Take 1 capsule (150 mg total) by mouth 3 (three) times daily., Disp: 90 capsule, Rfl: 1   tiZANidine (ZANAFLEX) 2 MG tablet, Take 1 tablet (2 mg total) by mouth at bedtime., Disp: 90 tablet, Rfl: 1   umeclidinium-vilanterol (ANORO ELLIPTA) 62.5-25 MCG/ACT AEPB, Inhale 1 puff into the lungs daily., Disp: 60 each, Rfl: 1   vitamin B-12 (CYANOCOBALAMIN) 500 MCG tablet, Take 250 mcg by mouth every morning., Disp: , Rfl:    ARIPiprazole (ABILIFY) 5 MG tablet, Take 1 tablet (5 mg total) by mouth daily. (Patient not taking: Reported on 10/02/2023), Disp: 90 tablet, Rfl: 1    Ascorbic Acid (VITAMIN C) 1000 MG tablet, Take 1,000 mg by mouth daily as needed (immune health). (Patient not taking: Reported on 10/02/2023), Disp: , Rfl:    chlorpheniramine-HYDROcodone (TUSSIONEX) 10-8 MG/5ML, Take 5 mLs by mouth every 12 (twelve) hours as needed for cough. (Patient not taking: Reported on 10/02/2023), Disp: 115 mL, Rfl: 0   clobetasol cream (TEMOVATE) 0.05 %, Apply topically to bites on lower legs as directed daily up to 5 days a week until clear then use as needed for flares, Disp: 45 g, Rfl: 1   Magnesium 500 MG CAPS, Take 1 capsule (500 mg total) by mouth daily. (Patient not taking: Reported on 10/02/2023), Disp: 90 capsule, Rfl: PRN  Allergies  Allergen Reactions   Doxycycline Rash    Rash on cheeks and sides of nose    Duloxetine Rash    I personally reviewed active problem list, medication list, allergies, family history with the patient/caregiver today.   ROS  Ten systems reviewed and is negative except as mentioned in HPI    Objective  Vitals:   10/02/23 1035  BP: 110/70  Pulse: 86  Resp: 16  Temp: (!) 97.4 F (36.3 C)  TempSrc: Oral  SpO2: 99%  Weight: 187 lb 11.2 oz (85.1 kg)  Height: 5\' 7"  (1.702 m)    Body mass index is 29.4 kg/m.  Physical Exam  Constitutional: Patient appears well-developed and well-nourished.  No distress.  HEENT: head atraumatic, normocephalic, pupils equal and reactive to light, neck supple Cardiovascular: Normal rate, regular rhythm and normal heart sounds.  No murmur heard. No BLE edema. Pulmonary/Chest: Effort normal and breath sounds normal. No respiratory distress. Abdominal: Soft.  There is no tenderness. Psychiatric: Patient has a normal mood and affect. behavior is normal. Judgment and thought content normal.   Recent Results (from the past 2160 hours)  Glucose, capillary     Status: None   Collection Time: 08/06/23  8:34 AM  Result Value Ref Range   Glucose-Capillary 84 70 - 99 mg/dL    Comment: Glucose  reference range applies only to samples taken after fasting for at least 8 hours.  Comprehensive metabolic panel     Status: Abnormal   Collection Time: 08/18/23 10:05 AM  Result Value Ref Range   Sodium 138 135 - 145 mmol/L   Potassium 4.0 3.5 - 5.1 mmol/L   Chloride 106 98 - 111  mmol/L   CO2 23 22 - 32 mmol/L   Glucose, Bld 86 70 - 99 mg/dL    Comment: Glucose reference range applies only to samples taken after fasting for at least 8 hours.   BUN 21 (H) 6 - 20 mg/dL   Creatinine, Ser 9.60 (H) 0.44 - 1.00 mg/dL   Calcium 8.3 (L) 8.9 - 10.3 mg/dL   Total Protein 5.7 (L) 6.5 - 8.1 g/dL   Albumin 3.5 3.5 - 5.0 g/dL   AST 454 (HH) 15 - 41 U/L    Comment: RESULTS VERIFIED BY REPEAT TESTING RESULT CALLED TO, READ BACK BY AND VERIFIED WITH HEATHER HERMANN AT 10:51 ON 08/18/2023 BY KMR    ALT 370 (HH) 0 - 44 U/L    Comment: RESULTS VERIFIED BY REPEAT TESTING RESULT CALLED TO, READ BACK BY AND VERIFIED WITH HEATHER HERMANN AT 10:51 ON 08/18/2023 BY KMR    Alkaline Phosphatase 107 38 - 126 U/L   Total Bilirubin 1.0 <1.2 mg/dL   GFR, Estimated 58 (L) >60 mL/min    Comment: (NOTE) Calculated using the CKD-EPI Creatinine Equation (2021)    Anion gap 9 5 - 15    Comment: Performed at [facility], [address] [place], [place] [address]  CBC with Differential/Platelet     Status: Abnormal   Collection Time: 08/18/23 10:05 AM  Result Value Ref Range   WBC 1.8 (L) 4.0 - 10.5 K/uL   RBC 3.34 (L) 3.87 - 5.11 MIL/uL   Hemoglobin 12.1 12.0 - 15.0 g/dL   HCT 91.4 (L) 78.2 - 95.6 %   MCV 102.7 (H) 80.0 - 100.0 fL   MCH 36.2 (H) 26.0 - 34.0 pg   MCHC 35.3 30.0 - 36.0 g/dL   RDW 21.3 08.6 - 57.8 %   Platelets 64 (L) 150 - 400 K/uL   nRBC 0.0 0.0 - 0.2 %   Neutrophils Relative % 22 %   Neutro Abs 0.4 (LL) 1.7 - 7.7 K/uL    Comment: REPEATED TO VERIFY THIS CRITICAL RESULT HAS VERIFIED AND BEEN CALLED TO HERMANN,H BY CHASE ISLEY ON 12 02 2024 AT 1043, AND HAS BEEN READ BACK.      Lymphocytes Relative 60 %   Lymphs Abs 1.1 0.7 - 4.0 K/uL   Monocytes Relative 11 %   Monocytes Absolute 0.2 0.1 - 1.0 K/uL   Eosinophils Relative 4 %   Eosinophils Absolute 0.1 0.0 - 0.5 K/uL   Basophils Relative 2 %   Basophils Absolute 0.0 0.0 - 0.1 K/uL   Immature Granulocytes 1 %   Abs Immature Granulocytes 0.01 0.00 - 0.07 K/uL    Comment: Performed at [facility], [address] [place], [place] [address]  Comprehensive metabolic panel     Status: Abnormal   Collection Time: 08/27/23  8:41 AM  Result Value Ref Range   Sodium 138 135 - 145 mmol/L   Potassium 3.9 3.5 - 5.1 mmol/L   Chloride 105 98 - 111 mmol/L   CO2 25 22 - 32 mmol/L   Glucose, Bld 96 70 - 99 mg/dL    Comment: Glucose reference range applies only to samples taken after fasting for at least 8 hours.   BUN 17 6 - 20 mg/dL   Creatinine, Ser 9.52 (H) 0.44 - 1.00 mg/dL   Calcium 8.9 8.9 - 84.1 mg/dL   Total Protein 5.9 (L) 6.5 - 8.1 g/dL   Albumin 3.8 3.5 - 5.0 g/dL   AST 19 15 -  41 U/L   ALT 49 (H) 0 - 44 U/L   Alkaline Phosphatase 101 38 - 126 U/L   Total Bilirubin 1.0 <1.2 mg/dL   GFR, Estimated 47 (L) >60 mL/min    Comment: (NOTE) Calculated using the CKD-EPI Creatinine Equation (2021)    Anion gap 8 5 - 15    Comment: Performed at [facility], [address] [place], [place] [address]  CBC with Differential/Platelet     Status: Abnormal   Collection Time: 08/27/23  8:41 AM  Result Value Ref Range   WBC 2.4 (L) 4.0 - 10.5 K/uL   RBC 3.37 (L) 3.87 - 5.11 MIL/uL   Hemoglobin 12.1 12.0 - 15.0 g/dL   HCT 19.1 (L) 47.8 - 29.5 %   MCV 102.7 (H) 80.0 - 100.0 fL   MCH 35.9 (H) 26.0 - 34.0 pg   MCHC 35.0 30.0 - 36.0 g/dL   RDW 62.1 30.8 - 65.7 %   Platelets 68 (L) 150 - 400 K/uL   nRBC 0.0 0.0 - 0.2 %   Neutrophils Relative % 27 %   Neutro Abs 0.7 (L) 1.7 - 7.7 K/uL   Lymphocytes Relative 54 %   Lymphs Abs 1.3 0.7 - 4.0 K/uL   Monocytes Relative 10 %   Monocytes Absolute 0.2 0.1 - 1.0  K/uL   Eosinophils Relative 7 %   Eosinophils Absolute 0.2 0.0 - 0.5 K/uL   Basophils Relative 2 %   Basophils Absolute 0.0 0.0 - 0.1 K/uL   WBC Morphology      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   RBC Morphology UNREMARKABLE    Smear Review Normal platelet morphology     Comment: PLATELETS APPEAR DECREASED   Immature Granulocytes 0 %   Abs Immature Granulocytes 0.00 0.00 - 0.07 K/uL    Comment: Performed at [facility], [address] [place], [place] [address]  POCT urinalysis dipstick     Status: Abnormal   Collection Time: 09/04/23 10:35 AM  Result Value Ref Range   Color, UA straw    Clarity, UA turbid    Glucose, UA Negative Negative   Bilirubin, UA Small    Ketones, UA Negative    Spec Grav, UA 1.010 1.010 - 1.025   Blood, UA Small    pH, UA 5.0 5.0 - 8.0   Protein, UA Positive (A) Negative   Urobilinogen, UA 0.2 0.2 or 1.0 E.U./dL   Nitrite, UA Negative    Leukocytes, UA Moderate (2+) (A) Negative   Appearance Abnormal    Odor Mal   Urine Culture     Status: Abnormal   Collection Time: 09/04/23 11:30 AM   Specimen: Urine  Result Value Ref Range   MICRO NUMBER: [id number]    SPECIMEN QUALITY: Adequate    Sample Source URINE    STATUS: FINAL    ISOLATE 1: Escherichia coli (A)     Comment: 10,000-49,000 CFU/mL of Escherichia coli      Susceptibility   Escherichia coli - URINE CULTURE, REFLEX    AMOX/CLAVULANIC <=2 Sensitive     AMPICILLIN <=2 Sensitive     AMPICILLIN/SULBACTAM <=2 Sensitive     CEFAZOLIN* <=4 Not Reportable      * For infections other than uncomplicated UTI caused by E. coli, K. pneumoniae or P. mirabilis: Cefazolin is resistant if MIC > or = 8 mcg/mL. (Distinguishing susceptible versus intermediate for isolates with MIC < or = 4 mcg/mL requires additional testing.) For  uncomplicated UTI caused by E. coli, K. pneumoniae or P. mirabilis: Cefazolin is susceptible if MIC <32 mcg/mL and  predicts susceptible to the oral agents cefaclor, cefdinir, cefpodoxime, cefprozil, cefuroxime, cephalexin and loracarbef.     CEFTAZIDIME <=1 Sensitive     CEFEPIME <=1 Sensitive     CEFTRIAXONE <=1 Sensitive     CIPROFLOXACIN <=0.25 Sensitive     LEVOFLOXACIN <=0.12 Sensitive     GENTAMICIN <=1 Sensitive     IMIPENEM <=0.25 Sensitive     NITROFURANTOIN <=16 Sensitive     PIP/TAZO <=4 Sensitive     TOBRAMYCIN <=1 Sensitive     TRIMETH/SULFA* <=20 Sensitive      * For infections other than uncomplicated UTI caused by E. coli, K. pneumoniae or P. mirabilis: Cefazolin is resistant if MIC > or = 8 mcg/mL. (Distinguishing susceptible versus intermediate for isolates with MIC < or = 4 mcg/mL requires additional testing.) For uncomplicated UTI caused by E. coli, K. pneumoniae or P. mirabilis: Cefazolin is susceptible if MIC <32 mcg/mL and predicts susceptible to the oral agents cefaclor, cefdinir, cefpodoxime, cefprozil, cefuroxime, cephalexin and loracarbef. Legend: S = Susceptible  I = Intermediate R = Resistant  NS = Not susceptible SDD = Susceptible Dose Dependent * = Not Tested  NR = Not Reported **NN = See Therapy Comments   Surgical pathology     Status: None   Collection Time: 09/15/23 12:00 AM  Result Value Ref Range   SURGICAL PATHOLOGY      SURGICAL PATHOLOGY [facility] [address], [address] [place], [place] [address] Telephone [phone] or [phone] Fax [phone]  REPORT OF SURGICAL PATHOLOGY   Accession #: [id number] Patient Name: ARYANNAH, CADAVID Visit # : [id number]  MRN: [id number] Physician: Simonne Come "Vonna Kotyk" DOB/Age [birth year] (Age: 85) Gender: F Collected Date: 09/15/2023 Received Date: 09/15/2023  FINAL DIAGNOSIS       1. Lymph node for lymphoma,  :       -SMALL LYMPHOCYTIC LYMPHOMA/CHRONIC LYMPHOCYTIC LEUKEMIA      -SEE COMMENT       DATE SIGNED OUT: 09/18/2023 ELECTRONIC SIGNATURE : Smir Md, Bassam,  Pathologist, Electronic Signature  MICROSCOPIC DESCRIPTION 1. The sections show needle core biopsy fragments of lymph nodal tissue displaying effacement of the architecture by a relatively monomorphic population of primarily small lymphoid cells characterized by high nuclear cytoplasmic ratio, round to slightly irregular nuclei, dense chromatin and s mall to inconspicuous nucleoli.  This is associated with scattering of variably sized and ill-defined proliferation centers.  Flow cytometric analysis was performed [facility] [phone]) and shows a monoclonal, lambda restricted B-cell population associated with CD5 and CD200 expression.  Immunohistochemical stains were also performed including CD20, CD79a, CD3, CD5, CD10, CD23 and cyclin D1 with appropriate controls.  The lymphoid cells are primarily composed of B cells as seen with CD79a and CD23 associated with CD5 coexpression.  CD20 is mostly negative with only scattering of positive cells likely reflecting prior treatment.  No significant staining is seen with CD10 or cyclin D1.  There is an admixed T-cell population to a lesser extent as primarily seen with CD3.  The morphologic and phenotypic features are consistent with small lymphocytic lymphoma/chronic lymphocytic leukemia.  CASE COMMENTS STAINS USED IN DIAGNOSIS: H&E H&E H&E H&E H&E H&E Universal Negative Cont rol-DAB Stains used in diagnosis 1 CD-10, 1 CD 20, 1 CD 23, 1 CD 3, 1 CD 5, 1 CD 79a, 1 CYCLIN D1 Some of these immunohistochemical stains may have been  developed and the performance characteristics determined by [facility].  Some may not have been cleared or approved by the U.S. Food and Drug Administration.  The FDA has determined that such clearance or approval is not necessary.  This test is used for clinical purposes.  It should not be regarded as investigational or for research.  This laboratory is certified under the Clinical Laboratory Improvement  Amendments of 1988 (CLIA-88) as qualified to perform high complexity clinical laboratory testing.    CLINICAL HISTORY  SPECIMEN(S) OBTAINED 1. Lymph node for lymphoma,  SPECIMEN COMMENTS: SPECIMEN CLINICAL INFORMATION: 1. History of CLL, now with retroperitoneal lymphadenopathy, post CT guided bx of dominant left sided RP nodal mass    Gross Description 1. Specimen: "Lymph node"      Received  in saline: 6 tan-pink, 0.6-3.2 cm long tissue cores, each with a 0.1 cm      diameter. Two cores are placed in RPMI for possible flow cytometry; the      remaining cores are submitted in individual blocks (1A-D) for routine histologic      processing.      Received in formalin: 2 tan-white cores that are 1.5 cm and 1.7 cm long, each      with a 0.1 cm diameter. The cores are individually submitted in blocks 1E and 22F      (respectively).      AMG 09/15/2023        Report signed out from the following location(s) [place] 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  The B-cell population lacks CD20 expression which is likely related to prior treatment.  Clinical correlation is recommended.  GATING AND PHENOTYPIC ANALYSIS:  Gated population: Flow cytometric immunophenotyping is performed using antibodies to  the antigens listed in the table below. Electronic gates are placed around a cell cluster displaying light scatter properties corresponding to: lymphocytes  Abnormal Cells in gated population: 94%  Phenotype of Abnormal Cells: CD5, CD19, CD200, Lambda                       Lymphoid Antigens       Myeloid Ant igens Miscellaneous CD2  NEG  CD10 NEG  CD11b     ND   CD45 POS CD3  NEG  CD19 POS  CD11c     ND   HLA-Dr    ND CD4  NEG  CD20 NEG  CD13 ND   CD34 NEG CD5  POS  CD22 ND   CD14 ND   CD38 NEG CD7  NEG  CD79b     ND   CD15 ND   CD138     ND CD8  NEG  CD103     ND   CD16 ND   TdT  ND CD25 ND   CD200  POS  CD33 ND   CD123     ND TCRab     ND   sKappa    NEG  CD64 ND   CD41 ND TCRgd     NEG  sLambda   POS  CD117     ND   CD61 ND CD56 NEG  cKappa    ND   MPO  ND   CD71 ND CD57 ND   cLambda   ND        [id number]      GROSS DESCRIPTION:  Reference tissue case [id number].    Final Diagnosis performed by Guerry Bruin, MD.   Electronically signed 09/18/2023 Technical and / or Professional components performed at [facility], [address] [place], [place] [address].  The above tests were developed and their performance characteristics determined by the [facility] system for the physical and immunophenotypic characterization of cell populations. T hey have not been cleared by the U.S. Food and Drug administration. The  FDA has determined that such clearance or approval is not necessary. This test is used for clinical purposes. It should not be  regarded as investigational or for research   CBC upon arrival     Status: Abnormal   Collection Time: 09/15/23  8:00 AM  Result Value Ref Range   WBC 3.0 (L) 4.0 - 10.5 K/uL   RBC 3.24 (L) 3.87 - 5.11 MIL/uL   Hemoglobin 11.6 (L) 12.0 - 15.0 g/dL   HCT 19.1 (L) 47.8 - 29.5 %   MCV 104.9 (H) 80.0 - 100.0 fL   MCH 35.8 (H) 26.0 - 34.0 pg   MCHC 34.1 30.0 - 36.0 g/dL   RDW 62.1 30.8 - 65.7 %   Platelets 73  (L) 150 - 400 K/uL    Comment: Immature Platelet Fraction may be clinically indicated, consider ordering this additional test [id number] PLATELET COUNT CONFIRMED BY SMEAR    nRBC 0.0 0.0 - 0.2 %    Comment: Performed at [facility], [address] [place], [place] [address]  Protime-INR upon arrival     Status: None   Collection Time: 09/15/23  8:00 AM  Result Value Ref Range   Prothrombin Time 13.3 11.4 - 15.2 seconds   INR 1.0 0.8 - 1.2    Comment: (NOTE) INR goal varies based on device and disease states. Performed at [facility], [address] [place], [place] [address]   Comprehensive metabolic panel     Status: Abnormal   Collection Time: 09/29/23 10:26 AM  Result Value Ref Range   Sodium 139 135 - 145 mmol/L   Potassium 3.7 3.5 - 5.1 mmol/L   Chloride 106 98 - 111 mmol/L   CO2 24 22 - 32 mmol/L   Glucose, Bld 112 (H) 70 - 99 mg/dL    Comment: Glucose reference range applies only to samples taken after fasting for at least 8 hours.   BUN 18 6 - 20 mg/dL   Creatinine, Ser 0.27 (H) 0.44 - 1.00 mg/dL   Calcium 8.4 (L) 8.9 - 10.3 mg/dL   Total Protein 5.6 (L) 6.5 - 8.1 g/dL   Albumin 3.7 3.5 - 5.0 g/dL   AST 40 15 - 41 U/L   ALT 64 (H) 0 - 44 U/L   Alkaline Phosphatase 85 38 - 126 U/L   Total Bilirubin 0.9 0.0 - 1.2 mg/dL   GFR, Estimated 48 (L) >60 mL/min    Comment: (NOTE) Calculated using the CKD-EPI Creatinine  Equation (2021)    Anion gap 9 5 - 15    Comment: Performed at [facility], [address] [place], [place] [address]  CBC with Differential/Platelet     Status: Abnormal   Collection Time: 09/29/23 10:26 AM  Result Value Ref Range   WBC 2.7 (L) 4.0 - 10.5 K/uL   RBC 3.37 (L) 3.87 - 5.11 MIL/uL   Hemoglobin 11.9 (L) 12.0 - 15.0 g/dL   HCT 91.4 (L) 78.2 - 95.6 %   MCV 103.9 (H) 80.0 - 100.0 fL   MCH 35.3 (H) 26.0 - 34.0 pg   MCHC 34.0 30.0 - 36.0 g/dL   RDW 21.3 08.6 - 57.8 %   Platelets 77 (L) 150 - 400 K/uL   nRBC 0.0  0.0 - 0.2 %   Neutrophils Relative % 27 %   Neutro Abs 0.8 (L) 1.7 - 7.7 K/uL   Lymphocytes Relative 48 %   Lymphs Abs 1.3 0.7 - 4.0 K/uL   Monocytes Relative 14 %   Monocytes Absolute 0.4 0.1 - 1.0 K/uL   Eosinophils Relative 7 %   Eosinophils Absolute 0.2 0.0 - 0.5 K/uL   Basophils Relative 2 %   Basophils Absolute 0.1 0.0 - 0.1 K/uL   Immature Granulocytes 2 %   Abs Immature Granulocytes 0.04 0.00 - 0.07 K/uL    Comment: Performed at [facility], [address] [place], [place] [address]    Diabetic Foot Exam:     PHQ2/9:    10/02/2023   10:35 AM 09/04/2023   10:23 AM 06/19/2023    2:03 PM 05/21/2023    1:51 PM 05/21/2023    1:50 PM  Depression screen PHQ 2/9  Decreased Interest 0 0 0 0 0  Down, Depressed, Hopeless 0 0 0 0 0  PHQ - 2 Score 0 0 0 0 0  Altered sleeping 0   0 0  Tired, decreased energy 0   0 0  Change in appetite 0   0 0  Feeling bad or failure about yourself  0   0 0  Trouble concentrating 0   0 0  Moving slowly or fidgety/restless 0   0 0  Suicidal thoughts 0   0 0  PHQ-9 Score 0   0 0  Difficult doing work/chores Not difficult at all   Not difficult at all Not difficult at all    phq 9 is negative  Fall Risk:    10/02/2023   10:29 AM 09/04/2023   10:22 AM 06/19/2023    2:02 PM 05/21/2023    1:50 PM 04/21/2023    8:42 AM  Fall Risk   Falls in the past year? 0 0 0 0 0  Number falls in past yr: 0  0 0   Injury with Fall? 0  0 0   Risk for fall due to : No Fall Risks No Fall Risks No Fall Risks No Fall Risks No Fall Risks  Follow up Falls prevention discussed;Education provided;Falls evaluation completed Falls prevention discussed Falls prevention discussed Follow up appointment;Falls prevention discussed;Education provided;Falls evaluation completed Falls prevention discussed     Assessment & Plan  1. CLL (chronic lymphocytic leukemia) (HCC) (Primary)  Taking medication given by hematologist   2. Pancytopenia (HCC)  Monitored by  hematologist   3. Centrilobular emphysema (HCC)  On medication, trying to quit smoking.   4. Senile purpura (HCC)  stable  5. Major depression in remission (HCC)  On medication  6. Stage  3a chronic kidney disease (HCC)  We will monitor stop NSAID's  7. Thoracic aortic atherosclerosis (HCC)  - Lipid panel - atorvastatin (LIPITOR) 10 MG tablet; Take 1 tablet (10 mg total) by mouth in the morning.  Dispense: 90 tablet; Refill: 1  8. PAD (peripheral artery disease) (HCC)  Continue statin therapy  9. Chronic bilateral low back pain with bilateral sciatica  Stop nsaid's  10. Screening examination for STI  - RPR - HIV Antibody (routine testing w rflx)  11. Vitamin D deficiency  - VITAMIN D 25 Hydroxy (Vit-D Deficiency, Fractures)  12. B12 deficiency  - B12 and Folate Panel

## 2023-10-03 ENCOUNTER — Other Ambulatory Visit: Payer: Self-pay

## 2023-10-03 ENCOUNTER — Other Ambulatory Visit (HOSPITAL_COMMUNITY): Payer: Self-pay

## 2023-10-03 ENCOUNTER — Encounter: Payer: Self-pay | Admitting: Oncology

## 2023-10-03 LAB — LIPID PANEL
Cholesterol: 115 mg/dL (ref <200)
HDL: 45 mg/dL — ABNORMAL LOW (ref >=50)
LDL Cholesterol (Calc): 54 mg/dL
Non-HDL Cholesterol (Calc): 70 mg/dL (ref <130)
Total CHOL/HDL Ratio: 2.6 (calc) (ref <5.0)
Triglycerides: 77 mg/dL (ref <150)

## 2023-10-03 LAB — RPR: RPR Ser Ql: NONREACTIVE

## 2023-10-03 LAB — VITAMIN D 25 HYDROXY (VIT D DEFICIENCY, FRACTURES): Vit D, 25-Hydroxy: 45 ng/mL (ref 30–100)

## 2023-10-03 LAB — HIV ANTIBODY (ROUTINE TESTING W REFLEX): HIV 1&2 Ab, 4th Generation: NONREACTIVE

## 2023-10-03 LAB — B12 AND FOLATE PANEL
Folate: 12.6 ng/mL
Vitamin B-12: 1628 pg/mL — ABNORMAL HIGH (ref 200–1100)

## 2023-10-06 ENCOUNTER — Encounter: Payer: Self-pay | Admitting: Family Medicine

## 2023-10-06 ENCOUNTER — Other Ambulatory Visit: Payer: Self-pay

## 2023-10-07 ENCOUNTER — Ambulatory Visit
Admission: RE | Admit: 2023-10-07 | Discharge: 2023-10-07 | Disposition: A | Discharge status: Home / Self Care | Payer: Medicare HMO | Source: Ambulatory Visit | Attending: Radiation Oncology | Admitting: Radiation Oncology

## 2023-10-07 VITALS — BP 132/73 | HR 70 | Temp 97.0°F | Resp 16 | Ht 67.0 in | Wt 185.8 lb

## 2023-10-07 DIAGNOSIS — C9110 Chronic lymphocytic leukemia of B-cell type not having achieved remission: Secondary | ICD-10-CM | POA: Insufficient documentation

## 2023-10-07 NOTE — Progress Notes (Signed)
Radiation Oncology Follow up Note old patient new area   abdominal CLL  Name: Monique [name]   Date:   10/07/2023 MRN:  [id number] DOB: November 26, 1970    This 53 y.o. female presents to the clinic today for reevaluation and patient previously treated approximate 4 months prior with palliative radiation therapy to her left axilla for involvement of CLL now with large abdominal mass progressing hypermetabolic consistent with CLL.  REFERRING PROVIDER: Alba Cory, MD  HPI: Patient is a 53 year old female well-known to our department having recently been treated to her left axilla for involvement of CLL.  She has done well although she states she does have a pulling sensation when she moves her left upper extremity in certain ways.  She is asymptomatic otherwise.  We have been following a mass in her abdominal periaortic region in the retroperitoneum which is hypermetabolic on PET scan performed back in.  November.  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  She has been treating with Revlimid without significant side effects.  We discussed her case at tumor board and recommendation was made for radiation therapy to this periaortic mass.  Again she is asymptomatic specifically denies diarrhea abdominal pain or discomfort fever chills or night sweats.  COMPLICATIONS OF TREATMENT: none  FOLLOW UP COMPLIANCE: keeps appointments   PHYSICAL EXAM:  BP 132/73   Pulse 70   Temp (!) 97 F (36.1 C) (Tympanic)   Resp 16   Ht 5\' 7"  (1.702 m)   Wt 185 lb 12.8 oz (84.3 kg)   LMP 08/17/2016   BMI 29.10 kg/m  Well-developed well-nourished patient in NAD. HEENT reveals PERLA, EOMI, discs not visualized.  Oral cavity is clear. No oral mucosal lesions are identified. Neck is clear without evidence of cervical or supraclavicular adenopathy. Lungs are clear to A&P. Cardiac examination is essentially unremarkable with regular rate  and rhythm without murmur rub or thrill. Abdomen is benign with no organomegaly or masses noted. Motor sensory and DTR levels are equal and symmetric in the upper and lower extremities. Cranial nerves II through XII are grossly intact. Proprioception is intact. No peripheral adenopathy or edema is identified. No motor or sensory levels are noted. Crude visual fields are within normal range.  RADIOLOGY RESULTS: CT scan and PET CT scans reviewed compatible with above-stated findings  PLAN: Present time elect to go ahead with radiation therapy to this abdominal mass.  I would use IMRT treatment planning and delivery and treat with 30 Gray in 15 fractions.  I would choose IMRT to avoid critical structures such as her small bowel kidneys spinal cord.  Risks and benefits of treatment clued and possible fatigue alteration blood counts diarrhea skin reaction all were reviewed in detail with the patient.  I personally set up and ordered CT simulation for next week.  Patient comprehends her recommendations well.  I would like to take this opportunity to thank you for allowing me to participate in the care of your patient.[name], MD

## 2023-10-09 ENCOUNTER — Encounter: Payer: Self-pay | Admitting: Oncology

## 2023-10-09 DIAGNOSIS — C9110 Chronic lymphocytic leukemia of B-cell type not having achieved remission: Secondary | ICD-10-CM | POA: Diagnosis not present

## 2023-10-16 ENCOUNTER — Ambulatory Visit
Admission: RE | Admit: 2023-10-16 | Discharge: 2023-10-16 | Disposition: A | Discharge status: Home / Self Care | Payer: Medicare HMO | Source: Ambulatory Visit | Attending: Radiation Oncology | Admitting: Radiation Oncology

## 2023-10-16 DIAGNOSIS — C9110 Chronic lymphocytic leukemia of B-cell type not having achieved remission: Secondary | ICD-10-CM | POA: Diagnosis not present

## 2023-10-20 ENCOUNTER — Other Ambulatory Visit: Payer: Self-pay | Admitting: Oncology

## 2023-10-20 DIAGNOSIS — C9110 Chronic lymphocytic leukemia of B-cell type not having achieved remission: Secondary | ICD-10-CM

## 2023-10-21 ENCOUNTER — Encounter: Payer: Self-pay | Admitting: Oncology

## 2023-10-23 ENCOUNTER — Ambulatory Visit
Admission: RE | Admit: 2023-10-23 | Discharge: 2023-10-23 | Disposition: A | Discharge status: Home / Self Care | Payer: Medicare HMO | Source: Ambulatory Visit | Attending: Radiation Oncology | Admitting: Radiation Oncology

## 2023-10-23 ENCOUNTER — Ambulatory Visit: Payer: Medicare HMO

## 2023-10-23 DIAGNOSIS — Z923 Personal history of irradiation: Secondary | ICD-10-CM | POA: Diagnosis not present

## 2023-10-23 DIAGNOSIS — C9110 Chronic lymphocytic leukemia of B-cell type not having achieved remission: Secondary | ICD-10-CM | POA: Insufficient documentation

## 2023-10-23 DIAGNOSIS — Z510 Encounter for antineoplastic radiation therapy: Secondary | ICD-10-CM | POA: Diagnosis not present

## 2023-10-24 ENCOUNTER — Other Ambulatory Visit: Payer: Self-pay

## 2023-10-24 ENCOUNTER — Other Ambulatory Visit: Payer: Self-pay | Admitting: Family Medicine

## 2023-10-27 ENCOUNTER — Ambulatory Visit
Admission: RE | Admit: 2023-10-27 | Discharge: 2023-10-27 | Disposition: A | Discharge status: Home / Self Care | Payer: Medicare HMO | Source: Ambulatory Visit | Attending: Radiation Oncology | Admitting: Radiation Oncology

## 2023-10-27 ENCOUNTER — Other Ambulatory Visit: Payer: Self-pay

## 2023-10-27 ENCOUNTER — Encounter: Payer: Self-pay | Admitting: Oncology

## 2023-10-27 ENCOUNTER — Other Ambulatory Visit (HOSPITAL_BASED_OUTPATIENT_CLINIC_OR_DEPARTMENT_OTHER): Payer: Self-pay

## 2023-10-27 ENCOUNTER — Other Ambulatory Visit (HOSPITAL_COMMUNITY): Payer: Self-pay

## 2023-10-27 DIAGNOSIS — C9110 Chronic lymphocytic leukemia of B-cell type not having achieved remission: Secondary | ICD-10-CM | POA: Diagnosis not present

## 2023-10-27 DIAGNOSIS — Z510 Encounter for antineoplastic radiation therapy: Secondary | ICD-10-CM | POA: Diagnosis not present

## 2023-10-27 DIAGNOSIS — Z923 Personal history of irradiation: Secondary | ICD-10-CM | POA: Diagnosis not present

## 2023-10-27 LAB — RAD ONC ARIA SESSION SUMMARY
Course Elapsed Days: 0
Plan Fractions Treated to Date: 1
Plan Prescribed Dose Per Fraction: 2 Gy
Plan Total Fractions Prescribed: 15
Plan Total Prescribed Dose: 30 Gy
Reference Point Dosage Given to Date: 2 Gy
Reference Point Session Dosage Given: 2 Gy
Session Number: 1

## 2023-10-27 MED ORDER — LORATADINE 10 MG PO TABS
10.0000 mg | ORAL_TABLET | Freq: Every day | ORAL | 1 refills | Status: DC
  Filled 2023-10-27: qty 90, 90d supply, fill #0
  Filled 2023-10-29 (×2): qty 30, 30d supply, fill #0
  Filled 2023-11-17 – 2023-11-25 (×2): qty 30, 30d supply, fill #1
  Filled 2023-12-04 – 2023-12-18 (×2): qty 30, 30d supply, fill #2
  Filled 2024-01-05 – 2024-01-13 (×2): qty 30, 30d supply, fill #3
  Filled 2024-02-02 – 2024-02-17 (×2): qty 30, 30d supply, fill #4
  Filled 2024-03-18: qty 30, 30d supply, fill #5

## 2023-10-27 NOTE — Telephone Encounter (Signed)
 Requested Prescriptions  Pending Prescriptions Disp Refills   loratadine  (CLARITIN ) 10 MG tablet 90 tablet 1    Sig: Take 1 tablet (10 mg total) by mouth daily in the morning.     Ear, Nose, and Throat:  Antihistamines 2 Failed - 10/27/2023  8:52 AM      Failed - Cr in normal range and within 360 days    Creatinine  Date Value Ref Range Status  06/19/2023 1.35 (H) 0.44 - 1.00 mg/dL Final   Creat  Date Value Ref Range Status  12/06/2020 0.96 0.50 - 1.05 mg/dL Final    Comment:    For patients >66 years of age, the reference limit for Creatinine is approximately 13% higher for people identified as African-American. .    Creatinine, Ser  Date Value Ref Range Status  09/29/2023 1.32 (H) 0.44 - 1.00 mg/dL Final         Passed - Valid encounter within last 12 months    Recent Outpatient Visits           3 weeks ago CLL (chronic lymphocytic leukemia) [facility])   [facility] Arleen Lacer, MD   1 month ago Acute cough   [facility] Quinton Buckler, FNP   4 months ago Vaginal discharge   [facility] Quinton Buckler, FNP   5 months ago Pruritic condition   [facility] Adeline Hone, PA-C   6 months ago CLL (chronic lymphocytic leukemia) [facility])   [facility] Arleen Lacer, MD       Future Appointments             In 5 months Ava Lei, Krichna, MD [facility], [facility]

## 2023-10-28 ENCOUNTER — Ambulatory Visit
Admission: RE | Admit: 2023-10-28 | Discharge: 2023-10-28 | Disposition: A | Discharge status: Home / Self Care | Payer: Medicare HMO | Source: Ambulatory Visit | Attending: Radiation Oncology | Admitting: Radiation Oncology

## 2023-10-28 ENCOUNTER — Other Ambulatory Visit: Payer: Self-pay

## 2023-10-28 DIAGNOSIS — Z923 Personal history of irradiation: Secondary | ICD-10-CM | POA: Diagnosis not present

## 2023-10-28 DIAGNOSIS — C9110 Chronic lymphocytic leukemia of B-cell type not having achieved remission: Secondary | ICD-10-CM | POA: Diagnosis not present

## 2023-10-28 DIAGNOSIS — Z510 Encounter for antineoplastic radiation therapy: Secondary | ICD-10-CM | POA: Diagnosis not present

## 2023-10-28 LAB — RAD ONC ARIA SESSION SUMMARY
Course Elapsed Days: 1
Plan Fractions Treated to Date: 2
Plan Prescribed Dose Per Fraction: 2 Gy
Plan Total Fractions Prescribed: 15
Plan Total Prescribed Dose: 30 Gy
Reference Point Dosage Given to Date: 4 Gy
Reference Point Session Dosage Given: 2 Gy
Session Number: 2

## 2023-10-29 ENCOUNTER — Other Ambulatory Visit: Payer: Self-pay

## 2023-10-29 ENCOUNTER — Ambulatory Visit
Admission: RE | Admit: 2023-10-29 | Discharge: 2023-10-29 | Disposition: A | Discharge status: Home / Self Care | Payer: Medicare HMO | Source: Ambulatory Visit | Attending: Radiation Oncology | Admitting: Radiation Oncology

## 2023-10-29 ENCOUNTER — Encounter: Payer: Self-pay | Admitting: Oncology

## 2023-10-29 DIAGNOSIS — Z923 Personal history of irradiation: Secondary | ICD-10-CM | POA: Diagnosis not present

## 2023-10-29 DIAGNOSIS — Z510 Encounter for antineoplastic radiation therapy: Secondary | ICD-10-CM | POA: Diagnosis not present

## 2023-10-29 DIAGNOSIS — C9110 Chronic lymphocytic leukemia of B-cell type not having achieved remission: Secondary | ICD-10-CM | POA: Diagnosis not present

## 2023-10-29 LAB — RAD ONC ARIA SESSION SUMMARY
Course Elapsed Days: 2
Plan Fractions Treated to Date: 3
Plan Prescribed Dose Per Fraction: 2 Gy
Plan Total Fractions Prescribed: 15
Plan Total Prescribed Dose: 30 Gy
Reference Point Dosage Given to Date: 6 Gy
Reference Point Session Dosage Given: 2 Gy
Session Number: 3

## 2023-10-29 MED ORDER — OMEPRAZOLE 20 MG PO CPDR
20.0000 mg | DELAYED_RELEASE_CAPSULE | Freq: Every morning | ORAL | 4 refills | Status: DC
  Filled 2023-10-29 (×2): qty 30, 30d supply, fill #0
  Filled 2023-11-17 – 2023-11-25 (×2): qty 30, 30d supply, fill #1
  Filled 2023-12-04 – 2023-12-18 (×2): qty 30, 30d supply, fill #2
  Filled 2024-01-05 – 2024-01-13 (×2): qty 30, 30d supply, fill #3
  Filled 2024-02-02 – 2024-02-17 (×2): qty 30, 30d supply, fill #4

## 2023-10-30 ENCOUNTER — Ambulatory Visit
Admission: RE | Admit: 2023-10-30 | Discharge: 2023-10-30 | Disposition: A | Discharge status: Home / Self Care | Payer: Medicare HMO | Source: Ambulatory Visit | Attending: Radiation Oncology | Admitting: Radiation Oncology

## 2023-10-30 ENCOUNTER — Other Ambulatory Visit: Payer: Self-pay

## 2023-10-30 ENCOUNTER — Other Ambulatory Visit (HOSPITAL_COMMUNITY): Payer: Self-pay

## 2023-10-30 DIAGNOSIS — Z510 Encounter for antineoplastic radiation therapy: Secondary | ICD-10-CM | POA: Diagnosis not present

## 2023-10-30 DIAGNOSIS — Z923 Personal history of irradiation: Secondary | ICD-10-CM | POA: Diagnosis not present

## 2023-10-30 DIAGNOSIS — C9110 Chronic lymphocytic leukemia of B-cell type not having achieved remission: Secondary | ICD-10-CM | POA: Diagnosis not present

## 2023-10-30 LAB — RAD ONC ARIA SESSION SUMMARY
Course Elapsed Days: 3
Plan Fractions Treated to Date: 4
Plan Prescribed Dose Per Fraction: 2 Gy
Plan Total Fractions Prescribed: 15
Plan Total Prescribed Dose: 30 Gy
Reference Point Dosage Given to Date: 8 Gy
Reference Point Session Dosage Given: 2 Gy
Session Number: 4

## 2023-10-31 ENCOUNTER — Other Ambulatory Visit (HOSPITAL_COMMUNITY): Payer: Self-pay

## 2023-10-31 ENCOUNTER — Ambulatory Visit
Admission: RE | Admit: 2023-10-31 | Discharge: 2023-10-31 | Disposition: A | Discharge status: Home / Self Care | Payer: Medicare HMO | Source: Ambulatory Visit | Attending: Radiation Oncology | Admitting: Radiation Oncology

## 2023-10-31 ENCOUNTER — Other Ambulatory Visit: Payer: Self-pay

## 2023-10-31 DIAGNOSIS — Z510 Encounter for antineoplastic radiation therapy: Secondary | ICD-10-CM | POA: Diagnosis not present

## 2023-10-31 DIAGNOSIS — C9110 Chronic lymphocytic leukemia of B-cell type not having achieved remission: Secondary | ICD-10-CM | POA: Diagnosis not present

## 2023-10-31 DIAGNOSIS — Z923 Personal history of irradiation: Secondary | ICD-10-CM | POA: Diagnosis not present

## 2023-10-31 LAB — RAD ONC ARIA SESSION SUMMARY
Course Elapsed Days: 4
Plan Fractions Treated to Date: 5
Plan Prescribed Dose Per Fraction: 2 Gy
Plan Total Fractions Prescribed: 15
Plan Total Prescribed Dose: 30 Gy
Reference Point Dosage Given to Date: 10 Gy
Reference Point Session Dosage Given: 2 Gy
Session Number: 5

## 2023-11-01 ENCOUNTER — Other Ambulatory Visit (HOSPITAL_COMMUNITY): Payer: Self-pay

## 2023-11-03 ENCOUNTER — Other Ambulatory Visit: Payer: Self-pay

## 2023-11-03 ENCOUNTER — Ambulatory Visit
Admission: RE | Admit: 2023-11-03 | Discharge: 2023-11-03 | Disposition: A | Discharge status: Home / Self Care | Payer: Medicare HMO | Source: Ambulatory Visit | Attending: Radiation Oncology | Admitting: Radiation Oncology

## 2023-11-03 ENCOUNTER — Other Ambulatory Visit (HOSPITAL_COMMUNITY): Payer: Self-pay

## 2023-11-03 DIAGNOSIS — Z923 Personal history of irradiation: Secondary | ICD-10-CM | POA: Diagnosis not present

## 2023-11-03 DIAGNOSIS — C9110 Chronic lymphocytic leukemia of B-cell type not having achieved remission: Secondary | ICD-10-CM | POA: Diagnosis not present

## 2023-11-03 DIAGNOSIS — Z510 Encounter for antineoplastic radiation therapy: Secondary | ICD-10-CM | POA: Diagnosis not present

## 2023-11-03 LAB — RAD ONC ARIA SESSION SUMMARY
Course Elapsed Days: 7
Plan Fractions Treated to Date: 6
Plan Prescribed Dose Per Fraction: 2 Gy
Plan Total Fractions Prescribed: 15
Plan Total Prescribed Dose: 30 Gy
Reference Point Dosage Given to Date: 12 Gy
Reference Point Session Dosage Given: 2 Gy
Session Number: 6

## 2023-11-04 ENCOUNTER — Ambulatory Visit
Admission: RE | Admit: 2023-11-04 | Discharge: 2023-11-04 | Disposition: A | Discharge status: Home / Self Care | Payer: Medicare HMO | Source: Ambulatory Visit | Attending: Radiation Oncology | Admitting: Radiation Oncology

## 2023-11-04 ENCOUNTER — Other Ambulatory Visit: Payer: Self-pay | Admitting: Family Medicine

## 2023-11-04 ENCOUNTER — Other Ambulatory Visit: Payer: Self-pay

## 2023-11-04 DIAGNOSIS — Z1231 Encounter for screening mammogram for malignant neoplasm of breast: Secondary | ICD-10-CM

## 2023-11-04 DIAGNOSIS — Z510 Encounter for antineoplastic radiation therapy: Secondary | ICD-10-CM | POA: Diagnosis not present

## 2023-11-04 DIAGNOSIS — Z923 Personal history of irradiation: Secondary | ICD-10-CM | POA: Diagnosis not present

## 2023-11-04 DIAGNOSIS — C9110 Chronic lymphocytic leukemia of B-cell type not having achieved remission: Secondary | ICD-10-CM | POA: Diagnosis not present

## 2023-11-04 LAB — RAD ONC ARIA SESSION SUMMARY
Course Elapsed Days: 8
Plan Fractions Treated to Date: 7
Plan Prescribed Dose Per Fraction: 2 Gy
Plan Total Fractions Prescribed: 15
Plan Total Prescribed Dose: 30 Gy
Reference Point Dosage Given to Date: 14 Gy
Reference Point Session Dosage Given: 2 Gy
Session Number: 7

## 2023-11-05 ENCOUNTER — Other Ambulatory Visit: Payer: Self-pay

## 2023-11-05 ENCOUNTER — Ambulatory Visit
Admission: RE | Admit: 2023-11-05 | Discharge: 2023-11-05 | Disposition: A | Discharge status: Home / Self Care | Payer: Medicare HMO | Source: Ambulatory Visit | Attending: Radiation Oncology | Admitting: Radiation Oncology

## 2023-11-05 DIAGNOSIS — Z923 Personal history of irradiation: Secondary | ICD-10-CM | POA: Diagnosis not present

## 2023-11-05 DIAGNOSIS — Z510 Encounter for antineoplastic radiation therapy: Secondary | ICD-10-CM | POA: Diagnosis not present

## 2023-11-05 DIAGNOSIS — C9110 Chronic lymphocytic leukemia of B-cell type not having achieved remission: Secondary | ICD-10-CM | POA: Diagnosis not present

## 2023-11-05 LAB — RAD ONC ARIA SESSION SUMMARY
Course Elapsed Days: 9
Plan Fractions Treated to Date: 8
Plan Prescribed Dose Per Fraction: 2 Gy
Plan Total Fractions Prescribed: 15
Plan Total Prescribed Dose: 30 Gy
Reference Point Dosage Given to Date: 16 Gy
Reference Point Session Dosage Given: 2 Gy
Session Number: 8

## 2023-11-06 ENCOUNTER — Ambulatory Visit: Payer: Medicare HMO

## 2023-11-07 ENCOUNTER — Other Ambulatory Visit (HOSPITAL_COMMUNITY): Payer: Self-pay

## 2023-11-07 ENCOUNTER — Ambulatory Visit
Admission: RE | Admit: 2023-11-07 | Discharge: 2023-11-07 | Disposition: A | Discharge status: Home / Self Care | Payer: Medicare HMO | Source: Ambulatory Visit | Attending: Radiation Oncology | Admitting: Radiation Oncology

## 2023-11-07 ENCOUNTER — Other Ambulatory Visit: Payer: Self-pay

## 2023-11-07 DIAGNOSIS — Z510 Encounter for antineoplastic radiation therapy: Secondary | ICD-10-CM | POA: Diagnosis not present

## 2023-11-07 DIAGNOSIS — Z923 Personal history of irradiation: Secondary | ICD-10-CM | POA: Diagnosis not present

## 2023-11-07 DIAGNOSIS — C9110 Chronic lymphocytic leukemia of B-cell type not having achieved remission: Secondary | ICD-10-CM | POA: Diagnosis not present

## 2023-11-07 LAB — RAD ONC ARIA SESSION SUMMARY
Course Elapsed Days: 11
Plan Fractions Treated to Date: 9
Plan Prescribed Dose Per Fraction: 2 Gy
Plan Total Fractions Prescribed: 15
Plan Total Prescribed Dose: 30 Gy
Reference Point Dosage Given to Date: 18 Gy
Reference Point Session Dosage Given: 2 Gy
Session Number: 9

## 2023-11-10 ENCOUNTER — Other Ambulatory Visit: Payer: Self-pay

## 2023-11-10 ENCOUNTER — Ambulatory Visit
Admission: RE | Admit: 2023-11-10 | Discharge: 2023-11-10 | Disposition: A | Discharge status: Home / Self Care | Payer: Medicare HMO | Source: Ambulatory Visit | Attending: Radiation Oncology | Admitting: Radiation Oncology

## 2023-11-10 DIAGNOSIS — Z923 Personal history of irradiation: Secondary | ICD-10-CM | POA: Diagnosis not present

## 2023-11-10 DIAGNOSIS — C9110 Chronic lymphocytic leukemia of B-cell type not having achieved remission: Secondary | ICD-10-CM | POA: Diagnosis not present

## 2023-11-10 DIAGNOSIS — Z510 Encounter for antineoplastic radiation therapy: Secondary | ICD-10-CM | POA: Diagnosis not present

## 2023-11-10 LAB — RAD ONC ARIA SESSION SUMMARY
Course Elapsed Days: 14
Plan Fractions Treated to Date: 10
Plan Prescribed Dose Per Fraction: 2 Gy
Plan Total Fractions Prescribed: 15
Plan Total Prescribed Dose: 30 Gy
Reference Point Dosage Given to Date: 20 Gy
Reference Point Session Dosage Given: 2 Gy
Session Number: 10

## 2023-11-11 ENCOUNTER — Other Ambulatory Visit (HOSPITAL_COMMUNITY)
Admission: RE | Admit: 2023-11-11 | Discharge: 2023-11-11 | Disposition: A | Discharge status: Home / Self Care | Payer: Medicare HMO | Source: Ambulatory Visit | Attending: Nurse Practitioner | Admitting: Nurse Practitioner

## 2023-11-11 ENCOUNTER — Other Ambulatory Visit: Payer: Self-pay

## 2023-11-11 ENCOUNTER — Ambulatory Visit: Payer: Medicare HMO

## 2023-11-11 ENCOUNTER — Ambulatory Visit (INDEPENDENT_AMBULATORY_CARE_PROVIDER_SITE_OTHER): Payer: Medicare HMO | Admitting: Nurse Practitioner

## 2023-11-11 ENCOUNTER — Encounter: Payer: Self-pay | Admitting: Nurse Practitioner

## 2023-11-11 ENCOUNTER — Ambulatory Visit
Admission: RE | Admit: 2023-11-11 | Discharge: 2023-11-11 | Disposition: A | Discharge status: Home / Self Care | Payer: Medicare HMO | Source: Ambulatory Visit | Attending: Radiation Oncology | Admitting: Radiation Oncology

## 2023-11-11 VITALS — BP 116/82 | HR 85 | Resp 18 | Ht 67.0 in | Wt 185.0 lb

## 2023-11-11 DIAGNOSIS — R300 Dysuria: Secondary | ICD-10-CM | POA: Insufficient documentation

## 2023-11-11 DIAGNOSIS — C9110 Chronic lymphocytic leukemia of B-cell type not having achieved remission: Secondary | ICD-10-CM | POA: Diagnosis not present

## 2023-11-11 DIAGNOSIS — Z510 Encounter for antineoplastic radiation therapy: Secondary | ICD-10-CM | POA: Diagnosis not present

## 2023-11-11 DIAGNOSIS — Z923 Personal history of irradiation: Secondary | ICD-10-CM | POA: Diagnosis not present

## 2023-11-11 LAB — RAD ONC ARIA SESSION SUMMARY
Course Elapsed Days: 15
Plan Fractions Treated to Date: 11
Plan Prescribed Dose Per Fraction: 2 Gy
Plan Total Fractions Prescribed: 15
Plan Total Prescribed Dose: 30 Gy
Reference Point Dosage Given to Date: 22 Gy
Reference Point Session Dosage Given: 2 Gy
Session Number: 11

## 2023-11-11 LAB — POCT URINALYSIS DIPSTICK
Bilirubin, UA: NEGATIVE
Blood, UA: NEGATIVE
Glucose, UA: NEGATIVE
Ketones, UA: NEGATIVE
Nitrite, UA: POSITIVE
Odor: NORMAL
Protein, UA: NEGATIVE
Spec Grav, UA: 1.02 (ref 1.010–1.025)
Urobilinogen, UA: 0.2 U/dL
pH, UA: 6 (ref 5.0–8.0)

## 2023-11-11 MED ORDER — CEPHALEXIN 500 MG PO CAPS
500.0000 mg | ORAL_CAPSULE | Freq: Two times a day (BID) | ORAL | 0 refills | Status: AC
Start: 2023-11-11 — End: 2023-11-16

## 2023-11-11 NOTE — Progress Notes (Signed)
 BP 116/82   Pulse 85   Resp 18   Ht 5\' 7"  (1.702 m)   Wt 185 lb (83.9 kg)   LMP 08/17/2016   SpO2 95%   BMI 28.98 kg/m    Subjective:    Patient ID: Monique [name], female    DOB: June 20, 1971, 53 y.o.   MRN: [id number]  HPI: Monique [name] is a 53 y.o. female  Chief Complaint  Patient presents with   Abdominal Pain    Pelvic pressure burning with urination    Discussed the use of AI scribe software for clinical note transcription with the patient, who gave verbal consent to proceed.  History of Present Illness   The patient, with a history of recurrent UTIs, presents with symptoms suggestive of another UTI. She has been experiencing these symptoms for a few weeks, hoping she would resolve spontaneously. symptoms include dark urine and dysuria.  she denies any fever or back pain. However, the symptoms have progressively worsened, prompting the visit. The patient's previous urine culture grew E. Coli, which was sensitive to Keflex. Despite maintaining good hydration, the patient reports unusually dark urine. The patient is also undergoing radiation therapy, with four more treatments remaining.       10/02/2023   10:35 AM 09/04/2023   10:23 AM 06/19/2023    2:03 PM  Depression screen PHQ 2/9  Decreased Interest 0 0 0  Down, Depressed, Hopeless 0 0 0  PHQ - 2 Score 0 0 0  Altered sleeping 0    Tired, decreased energy 0    Change in appetite 0    Feeling bad or failure about yourself  0    Trouble concentrating 0    Moving slowly or fidgety/restless 0    Suicidal thoughts 0    PHQ-9 Score 0    Difficult doing work/chores Not difficult at all      Relevant past medical, surgical, family and social history reviewed and updated as indicated. Interim medical history since our last visit reviewed. Allergies and medications reviewed and updated.  Review of Systems  Ten systems reviewed and is negative except as mentioned in HPI      Objective:    BP 116/82   Pulse  85   Resp 18   Ht 5\' 7"  (1.702 m)   Wt 185 lb (83.9 kg)   LMP 08/17/2016   SpO2 95%   BMI 28.98 kg/m    Wt Readings from Last 3 Encounters:  11/11/23 185 lb (83.9 kg)  10/07/23 185 lb 12.8 oz (84.3 kg)  10/02/23 187 lb 11.2 oz (85.1 kg)    Physical Exam Vitals reviewed.  Constitutional:      Appearance: Normal appearance.  HENT:     Head: Normocephalic.  Cardiovascular:     Rate and Rhythm: Normal rate.  Pulmonary:     Effort: Pulmonary effort is normal.  Abdominal:     Tenderness: There is no right CVA tenderness or left CVA tenderness.  Neurological:     General: No focal deficit present.     Mental Status: She is alert and oriented to person, place, and time. Mental status is at baseline.  Psychiatric:        Mood and Affect: Mood normal.        Behavior: Behavior normal.        Thought Content: Thought content normal.        Judgment: Judgment normal.     Results for orders placed or performed  in visit on 11/11/23  POCT Urinalysis Dipstick   Collection Time: 11/11/23 10:46 AM  Result Value Ref Range   Color, UA drk yellow    Clarity, UA clear    Glucose, UA Negative Negative   Bilirubin, UA neg    Ketones, UA neg    Spec Grav, UA 1.020 1.010 - 1.025   Blood, UA neg    pH, UA 6.0 5.0 - 8.0   Protein, UA Negative Negative   Urobilinogen, UA 0.2 0.2 or 1.0 E.U./dL   Nitrite, UA pos    Leukocytes, UA Large (3+) (A) Negative   Appearance clear    Odor normal    *Note: Due to a large number of results and/or encounters for the requested time period, some results have not been displayed. A complete set of results can be found in Results Review.       Assessment & Plan:   Problem List Items Addressed This Visit   None Visit Diagnoses       Burning with urination    -  Primary   Relevant Medications   cephALEXin (KEFLEX) 500 MG capsule   Other Relevant Orders   POCT Urinalysis Dipstick (Completed)   Cervicovaginal ancillary only   Urine Culture         Assessment and Plan    Urinary Tract Infection Recurrent UTIs, current symptoms of UTI with positive urine dipstick for nitrites and leukocytes. Previous culture grew E. Coli sensitive to Keflex. -Start Keflex while awaiting urine culture results. -Send urine for culture and sensitivity.   Radiation Therapy Patient is nearing completion of radiation therapy. -Continue current treatment plan.  Follow-up Patient to message provider with updates on condition.        Follow up plan: Return if symptoms worsen or fail to improve.

## 2023-11-12 ENCOUNTER — Encounter: Payer: Self-pay | Admitting: Nurse Practitioner

## 2023-11-12 ENCOUNTER — Ambulatory Visit
Admission: RE | Admit: 2023-11-12 | Discharge: 2023-11-12 | Disposition: A | Discharge status: Home / Self Care | Payer: Medicare HMO | Source: Ambulatory Visit | Attending: Radiation Oncology | Admitting: Radiation Oncology

## 2023-11-12 ENCOUNTER — Other Ambulatory Visit: Payer: Self-pay | Admitting: Oncology

## 2023-11-12 ENCOUNTER — Other Ambulatory Visit: Payer: Self-pay

## 2023-11-12 DIAGNOSIS — C9110 Chronic lymphocytic leukemia of B-cell type not having achieved remission: Secondary | ICD-10-CM

## 2023-11-12 DIAGNOSIS — Z510 Encounter for antineoplastic radiation therapy: Secondary | ICD-10-CM | POA: Diagnosis not present

## 2023-11-12 DIAGNOSIS — Z923 Personal history of irradiation: Secondary | ICD-10-CM | POA: Diagnosis not present

## 2023-11-12 LAB — CERVICOVAGINAL ANCILLARY ONLY
Bacterial Vaginitis (gardnerella): NEGATIVE
Candida Glabrata: NEGATIVE
Candida Vaginitis: NEGATIVE
Chlamydia: NEGATIVE
Comment: NEGATIVE
Comment: NEGATIVE
Comment: NEGATIVE
Comment: NEGATIVE
Comment: NEGATIVE
Comment: NORMAL
Neisseria Gonorrhea: NEGATIVE
Trichomonas: NEGATIVE

## 2023-11-12 LAB — RAD ONC ARIA SESSION SUMMARY
Course Elapsed Days: 16
Plan Fractions Treated to Date: 12
Plan Prescribed Dose Per Fraction: 2 Gy
Plan Total Fractions Prescribed: 15
Plan Total Prescribed Dose: 30 Gy
Reference Point Dosage Given to Date: 24 Gy
Reference Point Session Dosage Given: 2 Gy
Session Number: 12

## 2023-11-12 NOTE — Telephone Encounter (Signed)
 RX sent.

## 2023-11-13 ENCOUNTER — Ambulatory Visit
Admission: RE | Admit: 2023-11-13 | Discharge: 2023-11-13 | Disposition: A | Discharge status: Home / Self Care | Payer: Medicare HMO | Source: Ambulatory Visit | Attending: Radiation Oncology | Admitting: Radiation Oncology

## 2023-11-13 ENCOUNTER — Other Ambulatory Visit: Payer: Self-pay

## 2023-11-13 DIAGNOSIS — Z510 Encounter for antineoplastic radiation therapy: Secondary | ICD-10-CM | POA: Diagnosis not present

## 2023-11-13 DIAGNOSIS — C9110 Chronic lymphocytic leukemia of B-cell type not having achieved remission: Secondary | ICD-10-CM | POA: Diagnosis not present

## 2023-11-13 DIAGNOSIS — Z923 Personal history of irradiation: Secondary | ICD-10-CM | POA: Diagnosis not present

## 2023-11-13 LAB — RAD ONC ARIA SESSION SUMMARY
Course Elapsed Days: 17
Plan Fractions Treated to Date: 13
Plan Prescribed Dose Per Fraction: 2 Gy
Plan Total Fractions Prescribed: 15
Plan Total Prescribed Dose: 30 Gy
Reference Point Dosage Given to Date: 26 Gy
Reference Point Session Dosage Given: 2 Gy
Session Number: 13

## 2023-11-13 LAB — URINE CULTURE
MICRO NUMBER:: 16127050
SPECIMEN QUALITY:: ADEQUATE

## 2023-11-14 ENCOUNTER — Encounter: Payer: Self-pay | Admitting: Oncology

## 2023-11-14 ENCOUNTER — Ambulatory Visit
Admission: RE | Admit: 2023-11-14 | Discharge: 2023-11-14 | Disposition: A | Discharge status: Home / Self Care | Payer: Medicare HMO | Source: Ambulatory Visit | Attending: Radiation Oncology | Admitting: Radiation Oncology

## 2023-11-14 ENCOUNTER — Other Ambulatory Visit: Payer: Self-pay

## 2023-11-14 ENCOUNTER — Ambulatory Visit: Payer: Medicare HMO

## 2023-11-14 ENCOUNTER — Other Ambulatory Visit: Payer: Self-pay | Admitting: *Deleted

## 2023-11-14 DIAGNOSIS — Z923 Personal history of irradiation: Secondary | ICD-10-CM | POA: Diagnosis not present

## 2023-11-14 DIAGNOSIS — C9110 Chronic lymphocytic leukemia of B-cell type not having achieved remission: Secondary | ICD-10-CM | POA: Diagnosis not present

## 2023-11-14 DIAGNOSIS — Z510 Encounter for antineoplastic radiation therapy: Secondary | ICD-10-CM | POA: Diagnosis not present

## 2023-11-14 LAB — RAD ONC ARIA SESSION SUMMARY
Course Elapsed Days: 18
Plan Fractions Treated to Date: 14
Plan Prescribed Dose Per Fraction: 2 Gy
Plan Total Fractions Prescribed: 15
Plan Total Prescribed Dose: 30 Gy
Reference Point Dosage Given to Date: 28 Gy
Reference Point Session Dosage Given: 2 Gy
Session Number: 14

## 2023-11-17 ENCOUNTER — Encounter: Payer: Self-pay | Admitting: Oncology

## 2023-11-17 ENCOUNTER — Inpatient Hospital Stay: Payer: Medicare HMO | Attending: Oncology

## 2023-11-17 ENCOUNTER — Other Ambulatory Visit: Payer: Self-pay

## 2023-11-17 ENCOUNTER — Ambulatory Visit: Payer: Medicare HMO

## 2023-11-17 ENCOUNTER — Inpatient Hospital Stay (HOSPITAL_BASED_OUTPATIENT_CLINIC_OR_DEPARTMENT_OTHER): Payer: Medicare HMO | Admitting: Oncology

## 2023-11-17 ENCOUNTER — Ambulatory Visit
Admission: RE | Admit: 2023-11-17 | Discharge: 2023-11-17 | Disposition: A | Discharge status: Home / Self Care | Payer: Medicare HMO | Source: Ambulatory Visit | Attending: Radiation Oncology | Admitting: Radiation Oncology

## 2023-11-17 VITALS — BP 101/61 | HR 77 | Temp 96.8°F | Resp 16 | Ht 67.0 in | Wt 184.0 lb

## 2023-11-17 DIAGNOSIS — C9110 Chronic lymphocytic leukemia of B-cell type not having achieved remission: Secondary | ICD-10-CM | POA: Insufficient documentation

## 2023-11-17 DIAGNOSIS — Z79899 Other long term (current) drug therapy: Secondary | ICD-10-CM | POA: Insufficient documentation

## 2023-11-17 DIAGNOSIS — Z923 Personal history of irradiation: Secondary | ICD-10-CM | POA: Insufficient documentation

## 2023-11-17 DIAGNOSIS — F32A Depression, unspecified: Secondary | ICD-10-CM | POA: Insufficient documentation

## 2023-11-17 DIAGNOSIS — D649 Anemia, unspecified: Secondary | ICD-10-CM | POA: Insufficient documentation

## 2023-11-17 DIAGNOSIS — D709 Neutropenia, unspecified: Secondary | ICD-10-CM | POA: Insufficient documentation

## 2023-11-17 DIAGNOSIS — N289 Disorder of kidney and ureter, unspecified: Secondary | ICD-10-CM | POA: Insufficient documentation

## 2023-11-17 DIAGNOSIS — F419 Anxiety disorder, unspecified: Secondary | ICD-10-CM | POA: Insufficient documentation

## 2023-11-17 DIAGNOSIS — D696 Thrombocytopenia, unspecified: Secondary | ICD-10-CM | POA: Insufficient documentation

## 2023-11-17 DIAGNOSIS — G629 Polyneuropathy, unspecified: Secondary | ICD-10-CM | POA: Insufficient documentation

## 2023-11-17 DIAGNOSIS — Z510 Encounter for antineoplastic radiation therapy: Secondary | ICD-10-CM | POA: Insufficient documentation

## 2023-11-17 LAB — CBC WITH DIFFERENTIAL/PLATELET
Abs Immature Granulocytes: 0.01 10*3/uL (ref 0.00–0.07)
Basophils Absolute: 0 10*3/uL (ref 0.0–0.1)
Basophils Relative: 1 %
Eosinophils Absolute: 0.1 10*3/uL (ref 0.0–0.5)
Eosinophils Relative: 3 %
HCT: 30.2 % — ABNORMAL LOW (ref 36.0–46.0)
Hemoglobin: 10.5 g/dL — ABNORMAL LOW (ref 12.0–15.0)
Immature Granulocytes: 1 %
Lymphocytes Relative: 60 %
Lymphs Abs: 1.2 10*3/uL (ref 0.7–4.0)
MCH: 35.7 pg — ABNORMAL HIGH (ref 26.0–34.0)
MCHC: 34.8 g/dL (ref 30.0–36.0)
MCV: 102.7 fL — ABNORMAL HIGH (ref 80.0–100.0)
Monocytes Absolute: 0.2 10*3/uL (ref 0.1–1.0)
Monocytes Relative: 12 %
Neutro Abs: 0.5 10*3/uL — ABNORMAL LOW (ref 1.7–7.7)
Neutrophils Relative %: 23 %
Platelets: 37 10*3/uL — ABNORMAL LOW (ref 150–400)
RBC: 2.94 MIL/uL — ABNORMAL LOW (ref 3.87–5.11)
RDW: 12.8 % (ref 11.5–15.5)
Smear Review: NORMAL
WBC: 1.9 10*3/uL — ABNORMAL LOW (ref 4.0–10.5)
nRBC: 0 % (ref 0.0–0.2)

## 2023-11-17 LAB — RAD ONC ARIA SESSION SUMMARY
Course Elapsed Days: 21
Plan Fractions Treated to Date: 15
Plan Prescribed Dose Per Fraction: 2 Gy
Plan Total Fractions Prescribed: 15
Plan Total Prescribed Dose: 30 Gy
Reference Point Dosage Given to Date: 30 Gy
Reference Point Session Dosage Given: 2 Gy
Session Number: 15

## 2023-11-17 LAB — CMP (CANCER CENTER ONLY)
ALT: 17 U/L (ref 0–44)
AST: 15 U/L (ref 15–41)
Albumin: 3.8 g/dL (ref 3.5–5.0)
Alkaline Phosphatase: 77 U/L (ref 38–126)
Anion gap: 10 (ref 5–15)
BUN: 15 mg/dL (ref 6–20)
CO2: 24 mmol/L (ref 22–32)
Calcium: 8.8 mg/dL — ABNORMAL LOW (ref 8.9–10.3)
Chloride: 101 mmol/L (ref 98–111)
Creatinine: 1.24 mg/dL — ABNORMAL HIGH (ref 0.44–1.00)
GFR, Estimated: 52 mL/min — ABNORMAL LOW (ref >60)
Glucose, Bld: 82 mg/dL (ref 70–99)
Potassium: 3.8 mmol/L (ref 3.5–5.1)
Sodium: 135 mmol/L (ref 135–145)
Total Bilirubin: 1.1 mg/dL (ref 0.0–1.2)
Total Protein: 6 g/dL — ABNORMAL LOW (ref 6.5–8.1)

## 2023-11-17 NOTE — Progress Notes (Signed)
 [facility]  [phone] [phone]  ID: Monique [name] OB: Sep 28, 1970  MR#: [id number]  GUY#:[id number]  Patient Care Team: Alba Cory, MD as PCP - General (Family Medicine) Orlie Dakin Tollie Pizza, MD as Consulting Physician (Oncology) Zettie Pho, [facility] (Inactive) as Pharmacist (Pharmacist) Keitha Butte, RN as Registered Nurse (Oncology) Borders, Daryl Eastern, NP as Nurse Practitioner (Hospice and Palliative Medicine) [name], MD as Consulting Physician (Radiation Oncology)  CHIEF COMPLAINT: CLL.  INTERVAL HISTORY: Patient returns to clinic today for further evaluation at the conclusion of her XRT.  She tolerated treatment well without significant side effects.  She continues to tolerate Revlimid. She does not complain of weakness or fatigue today.  She continues to have a chronic peripheral neuropathy. She has no other neurologic complaints.  She has a good appetite.  She has no chest pain, shortness of breath, cough, or hemoptysis.  She denies any nausea, vomiting, or constipation.  She has no melena or hematochezia.  She has no urinary complaints.  Patient offers no further specific complaints today.  REVIEW OF SYSTEMS:   Review of Systems  Constitutional: Negative.  Negative for diaphoresis, fever, malaise/fatigue and weight loss.  HENT:  Negative for congestion.   Respiratory: Negative.  Negative for cough and shortness of breath.   Cardiovascular: Negative.  Negative for chest pain and leg swelling.  Gastrointestinal: Negative.  Negative for abdominal pain, constipation and diarrhea.  Genitourinary: Negative.  Negative for dysuria and frequency.  Musculoskeletal: Negative.  Negative for back pain, myalgias and neck pain.  Skin: Negative.  Negative for rash.  Neurological:  Positive for tingling and sensory change. Negative for focal weakness and weakness.  Psychiatric/Behavioral: Negative.  Negative for depression and  suicidal ideas. The patient is not nervous/anxious.     As per HPI. Otherwise, a complete review of systems is negative.  PAST MEDICAL HISTORY: Past Medical History:  Diagnosis Date   Anxiety    Bursitis    leg pain   Carpal tunnel syndrome    Cervical dysplasia    hx LEEP over 18 years ago.    Chronic lymphocytic leukemia (HCC) 2018   Dr Orlie Dakin.  (in lymph nodes)   COPD (chronic obstructive pulmonary disease) (HCC)    COVID-19 2021   Depression    Eating disorder    GERD (gastroesophageal reflux disease)    History of self-harm    Insomnia    Obsession    Tobacco abuse    Vitamin B12 deficiency (non anemic)     PAST SURGICAL HISTORY: Past Surgical History:  Procedure Laterality Date   BREAST BIOPSY Right 01/05/2018   US guided biopsy of 2 areas and 1 lymph node, MIXED INFLAMMATION AND GIANT CELL REACTION   CERVICAL BIOPSY  W/ LOOP ELECTRODE EXCISION     COLONOSCOPY WITH PROPOFOL N/A 04/21/2019   Procedure: COLONOSCOPY WITH PROPOFOL;  Surgeon: Pasty Spillers, MD;  Location: [facility] ENDOSCOPY;  Service: Gastroenterology;  Laterality: N/A;   OTHER SURGICAL HISTORY     scar tissue removed from vocal cords   TUBAL LIGATION     VAGINAL HYSTERECTOMY N/A 01/22/2021   Procedure: HYSTERECTOMY VAGINAL; BILATERAL SALPINGECTOMY;  Surgeon: Hildred Laser, MD;  Location: [facility] ORS;  Service: Gynecology;  Laterality: N/A;   vocal cord surgery  2005    FAMILY HISTORY: Family History  Problem Relation Age of Onset   Depression Mother    Cancer Mother        thyroid   Alcohol abuse  Father    COPD Father    Alcohol abuse Brother    Depression Brother    Bipolar disorder Brother    Suicidality Brother    ADD / ADHD Son    Breast cancer Neg Hx     ADVANCED DIRECTIVES (Y/N):  N  HEALTH MAINTENANCE: Social History   Tobacco Use   Smoking status: Every Day    Types: E-cigarettes   Smokeless tobacco: Never  Vaping Use   Vaping status: Former  Substance Use Topics    Alcohol use: Not Currently    Alcohol/week: 0.0 standard drinks of alcohol    Comment: rarely   Drug use: Yes    Frequency: 7.0 times per week    Types: Marijuana     Colonoscopy:  PAP:  Bone density:  Lipid panel:  Allergies  Allergen Reactions   Doxycycline Rash    Rash on cheeks and sides of nose    Duloxetine Rash    Current Outpatient Medications  Medication Sig Dispense Refill   albuterol (VENTOLIN HFA) 108 (90 Base) MCG/ACT inhaler Inhale 2 puffs into the lungs every 6 (six) hours as needed for wheezing or shortness of breath. 8 g 0   ARIPiprazole (ABILIFY) 5 MG tablet Take 1 tablet (5 mg total) by mouth daily. 90 tablet 1   ASPIRIN 81 PO Take 81 mg by mouth daily.     atorvastatin (LIPITOR) 10 MG tablet Take 1 tablet (10 mg total) by mouth in the morning. 90 tablet 1   Ca Phosphate-Cholecalciferol (CALTRATE GUMMY BITES) 250-10 MG-MCG CHEW Chew 1 gummy by mouth 2 (two) times daily. 180 tablet 3   diclofenac (FLECTOR) 1.3 % PTCH Place 1 patch onto the skin 2 (two) times daily. 60 patch 0   estrogens, conjugated, (PREMARIN) 0.625 MG tablet Take 1 tablet (0.625 mg total) by mouth daily for 21 days, then do not take for 7 days. 90 tablet 0   famotidine (PEPCID) 10 MG tablet Take by mouth.     lenalidomide (REVLIMID) 10 MG capsule TAKE 1 CAPSULE BY MOUTH EVERY DAY 28 capsule 0   loratadine (CLARITIN) 10 MG tablet Take 1 tablet (10 mg total) by mouth daily in the morning. 90 tablet 1   omeprazole (PRILOSEC) 20 MG capsule Take 1 capsule (20 mg total) by mouth in the morning. 30 capsule 4   pregabalin (LYRICA) 150 MG capsule Take 1 capsule (150 mg total) by mouth 3 (three) times daily. 90 capsule 1   tiZANidine (ZANAFLEX) 2 MG tablet Take 1 tablet (2 mg total) by mouth at bedtime. 90 tablet 1   umeclidinium-vilanterol (ANORO ELLIPTA) 62.5-25 MCG/ACT AEPB Inhale 1 puff into the lungs daily. 180 each 1   vitamin B-12 (CYANOCOBALAMIN) 500 MCG tablet Take 250 mcg by mouth every  morning.     No current facility-administered medications for this visit.    OBJECTIVE: Vitals:   11/17/23 1024  BP: 101/61  Pulse: 77  Resp: 16  Temp: (!) 96.8 F (36 C)  SpO2: 100%       Body mass index is 28.82 kg/m.    ECOG FS:0 - Asymptomatic  General: Well-developed, well-nourished, no acute distress. Eyes: Pink conjunctiva, anicteric sclera. HEENT: Normocephalic, moist mucous membranes. Lungs: No audible wheezing or coughing. Heart: Regular rate and rhythm. Abdomen: Soft, nontender, no obvious distention. Musculoskeletal: No edema, cyanosis, or clubbing. Neuro: Alert, answering all questions appropriately. Cranial nerves grossly intact. Skin: No rashes or petechiae noted. Psych: Normal affect.  LAB RESULTS:  Lab Results  Component Value Date   NA 135 11/17/2023   K 3.8 11/17/2023   CL 101 11/17/2023   CO2 24 11/17/2023   GLUCOSE 82 11/17/2023   BUN 15 11/17/2023   CREATININE 1.24 (H) 11/17/2023   CALCIUM 8.8 (L) 11/17/2023   PROT 6.0 (L) 11/17/2023   ALBUMIN 3.8 11/17/2023   AST 15 11/17/2023   ALT 17 11/17/2023   ALKPHOS 77 11/17/2023   BILITOT 1.1 11/17/2023   GFRNONAA 52 (L) 11/17/2023   GFRAA 79 12/06/2020    Lab Results  Component Value Date   WBC 1.9 (L) 11/17/2023   NEUTROABS 0.5 (L) 11/17/2023   HGB 10.5 (L) 11/17/2023   HCT 30.2 (L) 11/17/2023   MCV 102.7 (H) 11/17/2023   PLT 37 (L) 11/17/2023     STUDIES: No results found.   ONCOLOGY HISTORY:  Patient completed cycle 3 of Rituxan plus Treanda on March 26, 2018, but was then noted to have progressive disease.  She was then initiated on 480 mg Imbruvica in November 2019, but had progression of disease with increasing white blood cell count as well as CT scan on Jan 22, 2022 revealing increased lymphadenopathy consistent with progression of disease.  Attempted patient on venetoclax, but she could not tolerate it secondary to persistent diarrhea and transaminitis.   ASSESSMENT:  CLL.  PLAN:    CLL: Confirmed by peripheral blood flow cytometry.  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  Patient initially completed 4 weekly cycles of Rituxan then was transition to treatment every 4 weeks.  She last received Rituxan on May 07, 2023.  She also recently completed XRT to her left axilla.  PET scan results from August 06, 2023 reviewed independently and also discussed at tumor board with interventional radiology.  Biopsy completed of hypermetabolic mass was consistent with her known diagnosis of CLL with no evidence of aggressive transformation.  Patient has now completed XRT to this lesion. Continue Claritin and famotidine along with her 10 mg Revlimid daily.  Patient has discontinued prednisone.  Hold Revlimid 1 week given worsening pancytopenia.  Patient laboratory work in 1 week with follow-up phone call from clinical pharmacy.  She would then return to clinic in 5 weeks for repeat laboratory work and further evaluation.  Appreciate clinical pharmacy input. Neutropenia: ANC is dropped slightly to 0.5.  Hold Revlimid 1 week as above.   Thrombocytopenia: Platelet count is dropped to 37.  Hold Revlimid.   Anemia: Patient's hemoglobin is trended down to 10.5.  Monitor.   Trigeminal neuralgia: Resolved.  MRI of the brain on Jan 25, 2019 was unremarkable.   Depression/anxiety: Chronic and unchanged.  Continue follow-up and treatment per primary care. Neuropathic pain/peripheral neuropathy: Chronic and unchanged.  She reports she is now on disability.  Continue current follow-up and treatment as per neurology. Rituxan reaction: Patient will require additional premedications for preventative measures if Rituxan is reinitiated in the future. Rash: Resolved.  Continue Claritin and famotidine along with Revlimid.  Weight gain: Patient has discontinued prednisone. Transaminitis: Resolved. Renal insufficiency:  Mild, monitor.  Patient's creatinine is 1.24.   Patient expressed understanding and was in agreement with this plan. She also understands that She can call clinic at any time with any questions, concerns, or complaints.    Jeralyn Ruths, MD   11/17/2023 12:20 PM

## 2023-11-18 ENCOUNTER — Ambulatory Visit: Payer: Medicare HMO

## 2023-11-18 NOTE — Radiation Completion Notes (Signed)
 Patient Name: Monique [name], Monique [name] MRN: [id number] Date of Birth: [birth year] Referring Physician: Alba Cory, M.D. Date of Service: 2023-11-18 Radiation Oncologist: [name], M.D. 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  She has done well although she states she does have a pulling sensation when she moves her left upper extremity in certain ways.  She is asymptomatic otherwise.  We have been following a mass in her abdominal periaortic region in the retroperitoneum which is hypermetabolic on PET scan performed back in.  November.  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  She has been treating with Revlimid without significant side effects.  We discussed her case at tumor board and recommendation was made for radiation therapy to this periaortic mass.  Again she is asymptomatic specifically denies diarrhea abdominal pain or discomfort fever chills or night sweats.      ==========DELIVERED PLANS==========  First Treatment Date: 2023-10-27 Last Treatment Date: 2023-11-17   Plan Name: Abd Site: Abdomen Technique: IMRT Mode: Photon Dose Per Fraction: 2 Gy Prescribed Dose (Delivered / Prescribed): 30 Gy / 30 Gy Prescribed Fxs (Delivered / Prescribed): 15 / 15     ==========ON TREATMENT VISIT DATES========== 2023-10-28, 2023-11-04, 2023-11-11     ==========UPCOMING VISITS========== 12/31/2023 [facility] RAD ONCOLOGY FOLLOW UP 30 [name], MD  12/23/2023 [facility] MED ONC PHARMACY CLINIC [name], RPH-CPP  12/23/2023 [facility] MED ONC EST PT 15 Jeralyn Ruths,  MD  12/23/2023 [facility] MED ONC LAB [facility]  12/18/2023 [facility] CS MED [facility] MEDICARE AWV, SEQUENTIAL [facility]ANNUAL WELLNESS VISIT  12/17/2023 [facility] 20 [facility]  12/11/2023 [facility] RAD ONCOLOGY FOLLOW UP 30 Chrystal, Sherrine Maples, MD  11/25/2023 [facility] MED ONC TELEPHONE OFFICE VISIT [name], RPH-CPP  11/24/2023 [facility]C [facility]        ==========APPENDIX - ON TREATMENT VISIT NOTES==========   See weekly On Treatment Notes in Epic for details in the Media tab (listed as Progress notes on the On Treatment Visit Dates listed above).

## 2023-11-19 ENCOUNTER — Ambulatory Visit: Payer: Medicare HMO

## 2023-11-20 ENCOUNTER — Ambulatory Visit: Payer: Medicare HMO

## 2023-11-21 ENCOUNTER — Telehealth: Payer: Self-pay | Admitting: *Deleted

## 2023-11-21 ENCOUNTER — Ambulatory Visit: Payer: Medicare HMO

## 2023-11-21 NOTE — Telephone Encounter (Signed)
 Form complete and a copy for the patient is in envelope for the pt. Or her daughter.

## 2023-11-21 NOTE — Telephone Encounter (Signed)
 I called back and I found the fax number and it went though and spoke to the patient and let her know it was done.

## 2023-11-24 ENCOUNTER — Inpatient Hospital Stay

## 2023-11-24 DIAGNOSIS — G629 Polyneuropathy, unspecified: Secondary | ICD-10-CM | POA: Insufficient documentation

## 2023-11-24 DIAGNOSIS — D709 Neutropenia, unspecified: Secondary | ICD-10-CM | POA: Diagnosis not present

## 2023-11-24 DIAGNOSIS — D696 Thrombocytopenia, unspecified: Secondary | ICD-10-CM | POA: Insufficient documentation

## 2023-11-24 DIAGNOSIS — F419 Anxiety disorder, unspecified: Secondary | ICD-10-CM | POA: Diagnosis not present

## 2023-11-24 DIAGNOSIS — C9110 Chronic lymphocytic leukemia of B-cell type not having achieved remission: Secondary | ICD-10-CM | POA: Diagnosis present

## 2023-11-24 DIAGNOSIS — Z79899 Other long term (current) drug therapy: Secondary | ICD-10-CM | POA: Insufficient documentation

## 2023-11-24 DIAGNOSIS — D649 Anemia, unspecified: Secondary | ICD-10-CM | POA: Diagnosis not present

## 2023-11-24 DIAGNOSIS — N289 Disorder of kidney and ureter, unspecified: Secondary | ICD-10-CM | POA: Insufficient documentation

## 2023-11-24 DIAGNOSIS — Z923 Personal history of irradiation: Secondary | ICD-10-CM | POA: Insufficient documentation

## 2023-11-24 DIAGNOSIS — F32A Depression, unspecified: Secondary | ICD-10-CM | POA: Diagnosis not present

## 2023-11-24 LAB — CBC WITH DIFFERENTIAL/PLATELET
Abs Immature Granulocytes: 0 10*3/uL (ref 0.00–0.07)
Basophils Absolute: 0 10*3/uL (ref 0.0–0.1)
Basophils Relative: 1 %
Eosinophils Absolute: 0.1 10*3/uL (ref 0.0–0.5)
Eosinophils Relative: 2 %
HCT: 27.9 % — ABNORMAL LOW (ref 36.0–46.0)
Hemoglobin: 9.7 g/dL — ABNORMAL LOW (ref 12.0–15.0)
Immature Granulocytes: 0 %
Lymphocytes Relative: 71 %
Lymphs Abs: 1.6 10*3/uL (ref 0.7–4.0)
MCH: 35.5 pg — ABNORMAL HIGH (ref 26.0–34.0)
MCHC: 34.8 g/dL (ref 30.0–36.0)
MCV: 102.2 fL — ABNORMAL HIGH (ref 80.0–100.0)
Monocytes Absolute: 0.2 10*3/uL (ref 0.1–1.0)
Monocytes Relative: 8 %
Neutro Abs: 0.4 10*3/uL — CL (ref 1.7–7.7)
Neutrophils Relative %: 18 %
Platelets: 55 10*3/uL — ABNORMAL LOW (ref 150–400)
RBC: 2.73 MIL/uL — ABNORMAL LOW (ref 3.87–5.11)
RDW: 12.7 % (ref 11.5–15.5)
Smear Review: NORMAL
WBC: 2.2 10*3/uL — ABNORMAL LOW (ref 4.0–10.5)
nRBC: 0 % (ref 0.0–0.2)

## 2023-11-24 LAB — CMP (CANCER CENTER ONLY)
ALT: 17 U/L (ref 0–44)
AST: 15 U/L (ref 15–41)
Albumin: 3.6 g/dL (ref 3.5–5.0)
Alkaline Phosphatase: 80 U/L (ref 38–126)
Anion gap: 7 (ref 5–15)
BUN: 14 mg/dL (ref 6–20)
CO2: 24 mmol/L (ref 22–32)
Calcium: 8.6 mg/dL — ABNORMAL LOW (ref 8.9–10.3)
Chloride: 106 mmol/L (ref 98–111)
Creatinine: 1.19 mg/dL — ABNORMAL HIGH (ref 0.44–1.00)
GFR, Estimated: 54 mL/min — ABNORMAL LOW (ref >60)
Glucose, Bld: 90 mg/dL (ref 70–99)
Potassium: 4.1 mmol/L (ref 3.5–5.1)
Sodium: 137 mmol/L (ref 135–145)
Total Bilirubin: 0.6 mg/dL (ref 0.0–1.2)
Total Protein: 6.3 g/dL — ABNORMAL LOW (ref 6.5–8.1)

## 2023-11-25 ENCOUNTER — Encounter: Payer: Self-pay | Admitting: Oncology

## 2023-11-25 ENCOUNTER — Other Ambulatory Visit: Payer: Self-pay

## 2023-11-25 ENCOUNTER — Inpatient Hospital Stay (HOSPITAL_BASED_OUTPATIENT_CLINIC_OR_DEPARTMENT_OTHER): Admitting: Pharmacist

## 2023-11-25 ENCOUNTER — Other Ambulatory Visit (HOSPITAL_COMMUNITY): Payer: Self-pay

## 2023-11-25 ENCOUNTER — Encounter: Payer: Self-pay | Admitting: Family Medicine

## 2023-11-25 DIAGNOSIS — C9110 Chronic lymphocytic leukemia of B-cell type not having achieved remission: Secondary | ICD-10-CM

## 2023-11-25 NOTE — Progress Notes (Signed)
 Clinical Pharmacist Practitioner- Telephone Visit [facility]  [phone] [phone]    I connected with Monique [name] on 11/25/2023 at 11:00 AM EDT by telephone and verified that I am speaking with the correct person using two identifiers.  Name of the patient: Monique [name]  [id number]  09-04-1971   Location: Patient: home  Provider: in office   I discussed the limitations, risks, security and privacy concerns of performing an evaluation and management service by telephone and the availability of in person appointments. I also discussed with the patient that there may be a patient responsible charge related to this service. The patient expressed understanding and agreed to proceed.  HPI: Patient is a 53 y.o. female with  progressive CLL, previously treated with ibrutinib. Patient was intolerant to venetoclax treatment initiation and did not want to retry initiating venetoclax. The plan is to now treat her with rituximab and lenalidomide. Patient started rituximab on 02/28/22 and lenalidomide 03/01/22.   On 04/03/22, patient presented to clinic with a rash. Her lenalidomide was held and she was treated with prednisone (1 week) and loratadine continuosly. Rash improved by 04/10/22 f/u appt and she was restarted on lenalidomide.   On 04/28/22, patient called to report her rash had worsened and she stopped the lenalidomide on 04/23/22. She was stated on another prednisone taper. She was to taper down to 5mg  daily, then resume her lenalidomide. She resumed her lenalidomide on 05/16/22. Patient stopped rituximab after receiving 16 cycles, last dose 05/07/23.  Patient's lenalidomide was held on 11/17/23 due to neutropenia and thrombocytopenia.   Reason for Consult: Oral chemotherapy follow-up for lenalidomide therapy.   PAST MEDICAL HISTORY: Past Medical History:  Diagnosis Date   Anxiety    Bursitis    leg pain   Carpal tunnel syndrome    Cervical dysplasia     hx LEEP over 18 years ago.    Chronic lymphocytic leukemia (HCC) 2018   Dr Orlie Dakin.  (in lymph nodes)   COPD (chronic obstructive pulmonary disease) (HCC)    COVID-19 2021   Depression    Eating disorder    GERD (gastroesophageal reflux disease)    History of self-harm    Insomnia    Obsession    Tobacco abuse    Vitamin B12 deficiency (non anemic)     HEMATOLOGY/ONCOLOGY HISTORY:  Oncology History Overview Note  Patient with recent CLL diagnosis by flow cytometry.  Previously not being treated in observation.   S/p 4 cycles of weekly Rituxan completing on 02/20/2017 scans from January 2018 revealed minimal progression.   Developed right breast mass in March 2019.  Had diagnostic mammogram performed on 12/26/2017 showed suspicious right breast mass.  Biopsy revealed granulomatous mastitis.  She was treated with doxycycline 100 mg twice daily for 7 days.   Developed worsening adenopathy.  CT chest/abdomen/pelvis and soft tissue neck from May 2019 revealed progressive bulky adenopathy in the chest, abdomen and pelvis compatible with worsening disease.   Dr. Orlie Dakin recommended beginning treatment with Rituxan and Treanda.  Received first cycle last week.  S/p 6 cycles of Rituxan and Treanda.  Tolerating well.  Last dose given 03/25/18.   CLL (chronic lymphocytic leukemia) (HCC)  11/24/2016 Initial Diagnosis   CLL (chronic lymphocytic leukemia) (HCC)   01/21/2018 - 03/26/2018 Chemotherapy   The patient had palonosetron (ALOXI) injection 0.25 mg, 0.25 mg, Intravenous,  Once, 4 of 5 cycles Administration: 0.25 mg (01/28/2018), 0.25 mg (02/25/2018), 0.25 mg (03/25/2018) riTUXimab (RITUXAN) 700 mg in sodium  chloride 0.9 % 250 mL (2.1875 mg/mL) infusion, 375 mg/m2 = 700 mg, Intravenous,  Once, 4 of 5 cycles Administration: 700 mg (01/28/2018) bendamustine (BENDEKA) 150 mg in sodium chloride 0.9 % 50 mL (2.6786 mg/mL) chemo infusion, 90 mg/m2 = 150 mg, Intravenous,  Once, 4 of 5  cycles Administration: 150 mg (01/28/2018), 150 mg (01/29/2018), 150 mg (02/25/2018), 150 mg (02/26/2018), 150 mg (03/25/2018), 150 mg (03/26/2018)  for chemotherapy treatment.    02/28/2022 - 04/18/2022 Chemotherapy   Patient is on Treatment Plan : NON-HODGKINS LYMPHOMA Rituximab q7d     02/28/2022 -  Chemotherapy   Patient is on Treatment Plan : NON-HODGKINS LYMPHOMA Rituximab q28d     03/12/2022 - 03/12/2022 Chemotherapy   Patient is on Treatment Plan : LYMPHOMA CLL/SLL Venetoclax ramp up / Venetoclax + Rituximab (375/500) q28d x 6 cycles / Venetoclax q28d x 18 cycles       ALLERGIES:  is allergic to doxycycline and duloxetine.  MEDICATIONS:  Current Outpatient Medications  Medication Sig Dispense Refill   albuterol (VENTOLIN HFA) 108 (90 Base) MCG/ACT inhaler Inhale 2 puffs into the lungs every 6 (six) hours as needed for wheezing or shortness of breath. 8 g 0   ARIPiprazole (ABILIFY) 5 MG tablet Take 1 tablet (5 mg total) by mouth daily. 90 tablet 1   ASPIRIN 81 PO Take 81 mg by mouth daily.     atorvastatin (LIPITOR) 10 MG tablet Take 1 tablet (10 mg total) by mouth in the morning. 90 tablet 1   Ca Phosphate-Cholecalciferol (CALTRATE GUMMY BITES) 250-10 MG-MCG CHEW Chew 1 gummy by mouth 2 (two) times daily. 180 tablet 3   diclofenac (FLECTOR) 1.3 % PTCH Place 1 patch onto the skin 2 (two) times daily. 60 patch 0   estrogens, conjugated, (PREMARIN) 0.625 MG tablet Take 1 tablet (0.625 mg total) by mouth daily for 21 days, then do not take for 7 days. 90 tablet 0   famotidine (PEPCID) 10 MG tablet Take by mouth.     lenalidomide (REVLIMID) 10 MG capsule TAKE 1 CAPSULE BY MOUTH EVERY DAY 28 capsule 0   loratadine (CLARITIN) 10 MG tablet Take 1 tablet (10 mg total) by mouth daily in the morning. 90 tablet 1   omeprazole (PRILOSEC) 20 MG capsule Take 1 capsule (20 mg total) by mouth in the morning. 30 capsule 4   pregabalin (LYRICA) 150 MG capsule Take 1 capsule (150 mg total) by mouth 3 (three)  times daily. 90 capsule 1   tiZANidine (ZANAFLEX) 2 MG tablet Take 1 tablet (2 mg total) by mouth at bedtime. 90 tablet 1   umeclidinium-vilanterol (ANORO ELLIPTA) 62.5-25 MCG/ACT AEPB Inhale 1 puff into the lungs daily. 180 each 1   vitamin B-12 (CYANOCOBALAMIN) 500 MCG tablet Take 250 mcg by mouth every morning.     No current facility-administered medications for this visit.    VITAL SIGNS: LMP 08/17/2016  There were no vitals filed for this visit.  Estimated body mass index is 28.82 kg/m as calculated from the following:   Height as of 11/17/23: 5\' 7"  (1.702 m).   Weight as of 11/17/23: 83.5 kg (184 lb).  LABS: CBC:    Component Value Date/Time   WBC 2.2 (L) 11/24/2023 1044   HGB 9.7 (L) 11/24/2023 1044   HCT 27.9 (L) 11/24/2023 1044   PLT 55 (L) 11/24/2023 1044   MCV 102.2 (H) 11/24/2023 1044   NEUTROABS 0.4 (LL) 11/24/2023 1044   LYMPHSABS 1.6 11/24/2023 1044   MONOABS 0.2  11/24/2023 1044   EOSABS 0.1 11/24/2023 1044   BASOSABS 0.0 11/24/2023 1044   Comprehensive Metabolic Panel:    Component Value Date/Time   NA 137 11/24/2023 1044   NA 138 05/13/2018 1413   K 4.1 11/24/2023 1044   CL 106 11/24/2023 1044   CO2 24 11/24/2023 1044   BUN 14 11/24/2023 1044   BUN 15 05/13/2018 1413   CREATININE 1.19 (H) 11/24/2023 1044   CREATININE 0.96 12/06/2020 1646   GLUCOSE 90 11/24/2023 1044   CALCIUM 8.6 (L) 11/24/2023 1044   AST 15 11/24/2023 1044   ALT 17 11/24/2023 1044   ALKPHOS 80 11/24/2023 1044   BILITOT 0.6 11/24/2023 1044   PROT 6.3 (L) 11/24/2023 1044   PROT 6.3 05/13/2018 1413   ALBUMIN 3.6 11/24/2023 1044   ALBUMIN 4.1 05/13/2018 1413    On phone during today's visit: patient only  Assessment and Plan-  CMC/CBC reviewed, patient is still neutropenic/thrombocytopenic, and her hgb is trending down. Patient will continue to hold her lenalidomide Patient will return to clinic in 2 weeks of repeat labs and a virtual. She is aware that a bone marrow biopsy may be  needed in the future if her counts do not improve.   Oral Chemotherapy Side Effect/Intolerance:  Fatigue: patient reports fatigue last week, but states this week it has improved Bowel movements: patient reporting both diarrhea and constipation  No reported SOB  Oral Chemotherapy Adherence: N/A, lenalidomide on hold No patient barriers to medication adherence identified.   New medications: None reports  Medication Access Issues: N/A, lenalidomide on hold  Patient expressed understanding and was in agreement with this plan. She also understands that She can call clinic at any time with any questions, concerns, or complaints.   Follow-up plan: RTC in 2 weeks  Thank you for allowing me to participate in the care of this very pleasant patient.   Time Total: 10 mins of non face-to-face telephone visit time during this encounter  Visit consisted of counseling and education on dealing with issues of symptom management in the setting of serious and potentially life-threatening illness.Greater than 50%  of this time was spent counseling and coordinating care related to the above assessment and plan.   [name], PharmD, BCOP, CPP Hematology/Oncology Clinical Pharmacist [facility] Oral Chemotherapy Navigation [facility] [phone]  11/25/2023 11:50 AM

## 2023-11-26 ENCOUNTER — Encounter: Payer: Self-pay | Admitting: Oncology

## 2023-11-26 ENCOUNTER — Other Ambulatory Visit: Payer: Self-pay

## 2023-11-28 ENCOUNTER — Other Ambulatory Visit: Payer: Self-pay

## 2023-11-28 ENCOUNTER — Inpatient Hospital Stay: Admitting: Nurse Practitioner

## 2023-11-28 ENCOUNTER — Encounter: Payer: Self-pay | Admitting: Nurse Practitioner

## 2023-11-28 VITALS — BP 111/75 | HR 75 | Temp 97.6°F | Resp 20 | Wt 185.5 lb

## 2023-11-28 DIAGNOSIS — R233 Spontaneous ecchymoses: Secondary | ICD-10-CM

## 2023-11-28 DIAGNOSIS — D709 Neutropenia, unspecified: Secondary | ICD-10-CM

## 2023-11-28 DIAGNOSIS — D696 Thrombocytopenia, unspecified: Secondary | ICD-10-CM | POA: Diagnosis not present

## 2023-11-28 DIAGNOSIS — C9110 Chronic lymphocytic leukemia of B-cell type not having achieved remission: Secondary | ICD-10-CM | POA: Diagnosis not present

## 2023-11-28 NOTE — Progress Notes (Signed)
 Patient states her legs and feet are discolored. Patient states she has brown spots from her knee caps down to her feet. Patient states she started coughing over the weekend. Patient did not cough today but was coughing yesterday. She has Been off her chemo pill for the last two weeks. Patient states she had the discoloration when she was taking the chemo pill a while ago and was told that this was side affect. But does not understand why this has started again and she is not taking the chemo pill.

## 2023-11-28 NOTE — Progress Notes (Signed)
 Symptom Management Clinic  [facility] A Department of the [place]. [facility] [address], [address] [place], [place] [address] [phone] (phone) [phone] (fax)  Patient Care Team: Alba Cory, MD as PCP - General (Family Medicine) Jeralyn Ruths, MD as Consulting Physician (Oncology) Zettie Pho, [facility] (Inactive) as Pharmacist (Pharmacist) Keitha Butte, RN as Registered Nurse (Oncology) Borders, Daryl Eastern, NP as Nurse Practitioner (Hospice and Palliative Medicine) [name], MD as Consulting Physician (Radiation Oncology)   Name of the patient: Monique [name]  [id number]  08-05-1971   Date of visit: 11/28/23  Diagnosis- CLL  Chief complaint/ Reason for visit- Skin changes of feet  Heme/Onc history:  Oncology History Overview Note  Patient with recent CLL diagnosis by flow cytometry.  Previously not being treated in observation.   S/p 4 cycles of weekly Rituxan completing on 02/20/2017 scans from January 2018 revealed minimal progression.   Developed right breast mass in March 2019.  Had diagnostic mammogram performed on 12/26/2017 showed suspicious right breast mass.  Biopsy revealed granulomatous mastitis.  She was treated with doxycycline 100 mg twice daily for 7 days.   Developed worsening adenopathy.  CT chest/abdomen/pelvis and soft tissue neck from May 2019 revealed progressive bulky adenopathy in the chest, abdomen and pelvis compatible with worsening disease.   Dr. Orlie Dakin recommended beginning treatment with Rituxan and Treanda.  Received first cycle last week.  S/p 6 cycles of Rituxan and Treanda.  Tolerating well.  Last dose given 03/25/18.   CLL (chronic lymphocytic leukemia) (HCC)  11/24/2016 Initial Diagnosis   CLL (chronic lymphocytic leukemia) (HCC)   01/21/2018 - 03/26/2018 Chemotherapy   The patient had palonosetron (ALOXI) injection 0.25 mg, 0.25 mg, Intravenous,  Once, 4 of  5 cycles Administration: 0.25 mg (01/28/2018), 0.25 mg (02/25/2018), 0.25 mg (03/25/2018) riTUXimab (RITUXAN) 700 mg in sodium chloride 0.9 % 250 mL (2.1875 mg/mL) infusion, 375 mg/m2 = 700 mg, Intravenous,  Once, 4 of 5 cycles Administration: 700 mg (01/28/2018) bendamustine (BENDEKA) 150 mg in sodium chloride 0.9 % 50 mL (2.6786 mg/mL) chemo infusion, 90 mg/m2 = 150 mg, Intravenous,  Once, 4 of 5 cycles Administration: 150 mg (01/28/2018), 150 mg (01/29/2018), 150 mg (02/25/2018), 150 mg (02/26/2018), 150 mg (03/25/2018), 150 mg (03/26/2018)  for chemotherapy treatment.    02/28/2022 - 04/18/2022 Chemotherapy   Patient is on Treatment Plan : NON-HODGKINS LYMPHOMA Rituximab q7d     02/28/2022 -  Chemotherapy   Patient is on Treatment Plan : NON-HODGKINS LYMPHOMA Rituximab q28d     03/12/2022 - 03/12/2022 Chemotherapy   Patient is on Treatment Plan : LYMPHOMA CLL/SLL Venetoclax ramp up / Venetoclax + Rituximab (375/500) q28d x 6 cycles / Venetoclax q28d x 18 cycles       Interval history- Patient is 53 year old female with above history of CLL, on maintenance revlimid, currently held due to drop in counts. She has noticed over past few days that she's had some discoloration of her feet. Described as brown or slightly darkened spots. Not painful. Do not itch. No lesions or wounds. Otherwise feels at baseline. Denies other complaints.   Review of systems- Review of Systems  Constitutional:  Negative for chills, fever and malaise/fatigue.  Respiratory:  Negative for hemoptysis.   Gastrointestinal:  Negative for blood in stool and melena.  Genitourinary:  Negative for hematuria.  Musculoskeletal:  Negative for falls.  Skin:  Positive for rash. Negative for itching.  Psychiatric/Behavioral:  Negative for  depression. The patient is not nervous/anxious.     Allergies  Allergen Reactions   Doxycycline Rash    Rash on cheeks and sides of nose    Duloxetine Rash    Past Medical History:  Diagnosis Date    Anxiety    Bursitis    leg pain   Carpal tunnel syndrome    Cervical dysplasia    hx LEEP over 18 years ago.    Chronic lymphocytic leukemia (HCC) 2018   Dr Orlie Dakin.  (in lymph nodes)   COPD (chronic obstructive pulmonary disease) (HCC)    COVID-19 2021   Depression    Eating disorder    GERD (gastroesophageal reflux disease)    History of self-harm    Insomnia    Obsession    Tobacco abuse    Vitamin B12 deficiency (non anemic)     Past Surgical History:  Procedure Laterality Date   BREAST BIOPSY Right 01/05/2018   US guided biopsy of 2 areas and 1 lymph node, MIXED INFLAMMATION AND GIANT CELL REACTION   CERVICAL BIOPSY  W/ LOOP ELECTRODE EXCISION     COLONOSCOPY WITH PROPOFOL N/A 04/21/2019   Procedure: COLONOSCOPY WITH PROPOFOL;  Surgeon: Pasty Spillers, MD;  Location: [facility] ENDOSCOPY;  Service: Gastroenterology;  Laterality: N/A;   OTHER SURGICAL HISTORY     scar tissue removed from vocal cords   TUBAL LIGATION     VAGINAL HYSTERECTOMY N/A 01/22/2021   Procedure: HYSTERECTOMY VAGINAL; BILATERAL SALPINGECTOMY;  Surgeon: Hildred Laser, MD;  Location: [facility] ORS;  Service: Gynecology;  Laterality: N/A;   vocal cord surgery  2005    Social History   Socioeconomic History   Marital status: Divorced    Spouse name: NA   Number of children: 4   Years of education: 12   Highest education level: 12th grade  Occupational History   Occupation: Disabled   Tobacco Use   Smoking status: Every Day    Types: E-cigarettes   Smokeless tobacco: Never  Vaping Use   Vaping status: Former  Substance and Sexual Activity   Alcohol use: Not Currently    Alcohol/week: 0.0 standard drinks of alcohol    Comment: rarely   Drug use: Yes    Frequency: 7.0 times per week    Types: Marijuana   Sexual activity: Yes    Partners: Male    Birth control/protection: Surgical  Other Topics Concern   Not on file  Social History Narrative   Patient is single Patient is currently on  social security disability due to health challenges.     Her teenager son is living with his father    Her father lives in [place] and is on end stage COPD   Social Drivers of Health   Financial Resource Strain: Low Risk  (11/22/2022)   Overall Financial Resource Strain (CARDIA)    Difficulty of Paying Living Expenses: Not hard at all  Food Insecurity: No Food Insecurity (11/22/2022)   Hunger Vital Sign    Worried About Running Out of Food in the Last Year: Never true    Ran Out of Food in the Last Year: Never true  Transportation Needs: No Transportation Needs (11/22/2022)   PRAPARE - Administrator, Civil Service (Medical): No    Lack of Transportation (Non-Medical): No  Physical Activity: Inactive (11/22/2022)   Exercise Vital Sign    Days of Exercise per Week: 0 days    Minutes of Exercise per Session: 0 min  Stress: Stress  Concern Present (11/22/2022)   [employer] of Occupational Health - Occupational Stress Questionnaire    Feeling of Stress : Very much  Social Connections: Socially Isolated (11/22/2022)   Social Connection and Isolation Panel [NHANES]    Frequency of Communication with Friends and Family: More than three times a week    Frequency of Social Gatherings with Friends and Family: Twice a week    Attends Religious Services: Never    Database administrator or Organizations: No    Attends Engineer, structural: Not on file    Marital Status: Divorced  Intimate Partner Violence: Not At Risk (11/22/2022)   Humiliation, Afraid, Rape, and Kick questionnaire    Fear of Current or Ex-Partner: No    Emotionally Abused: No    Physically Abused: No    Sexually Abused: No    Family History  Problem Relation Age of Onset   Depression Mother    Cancer Mother        thyroid   Alcohol abuse Father    COPD Father    Alcohol abuse Brother    Depression Brother    Bipolar disorder Brother    Suicidality Brother    ADD / ADHD Son    Breast cancer Neg Hx       Current Outpatient Medications:    albuterol (VENTOLIN HFA) 108 (90 Base) MCG/ACT inhaler, Inhale 2 puffs into the lungs every 6 (six) hours as needed for wheezing or shortness of breath., Disp: 8 g, Rfl: 0   ARIPiprazole (ABILIFY) 5 MG tablet, Take 1 tablet (5 mg total) by mouth daily., Disp: 90 tablet, Rfl: 1   ASPIRIN 81 PO, Take 81 mg by mouth daily., Disp: , Rfl:    atorvastatin (LIPITOR) 10 MG tablet, Take 1 tablet (10 mg total) by mouth in the morning., Disp: 90 tablet, Rfl: 1   Ca Phosphate-Cholecalciferol (CALTRATE GUMMY BITES) 250-10 MG-MCG CHEW, Chew 1 gummy by mouth 2 (two) times daily., Disp: 180 tablet, Rfl: 3   diclofenac (FLECTOR) 1.3 % PTCH, Place 1 patch onto the skin 2 (two) times daily., Disp: 60 patch, Rfl: 0   estrogens, conjugated, (PREMARIN) 0.625 MG tablet, Take 1 tablet (0.625 mg total) by mouth daily for 21 days, then do not take for 7 days., Disp: 90 tablet, Rfl: 0   famotidine (PEPCID) 10 MG tablet, Take by mouth., Disp: , Rfl:    lenalidomide (REVLIMID) 10 MG capsule, TAKE 1 CAPSULE BY MOUTH EVERY DAY, Disp: 28 capsule, Rfl: 0   loratadine (CLARITIN) 10 MG tablet, Take 1 tablet (10 mg total) by mouth daily in the morning., Disp: 90 tablet, Rfl: 1   omeprazole (PRILOSEC) 20 MG capsule, Take 1 capsule (20 mg total) by mouth in the morning., Disp: 30 capsule, Rfl: 4   pregabalin (LYRICA) 150 MG capsule, Take 1 capsule (150 mg total) by mouth 3 (three) times daily., Disp: 90 capsule, Rfl: 1   tiZANidine (ZANAFLEX) 2 MG tablet, Take 1 tablet (2 mg total) by mouth at bedtime., Disp: 90 tablet, Rfl: 1   umeclidinium-vilanterol (ANORO ELLIPTA) 62.5-25 MCG/ACT AEPB, Inhale 1 puff into the lungs daily., Disp: 180 each, Rfl: 1   vitamin B-12 (CYANOCOBALAMIN) 500 MCG tablet, Take 250 mcg by mouth every morning., Disp: , Rfl:   Physical exam:  Vitals:   11/28/23 0934  BP: 111/75  Pulse: 75  Resp: 20  Temp: 97.6 F (36.4 C)  SpO2: 100%  Weight: 185 lb 8 oz (84.1 kg)  Physical Exam Vitals reviewed.  Constitutional:      Appearance: She is not ill-appearing.     Comments: Accompanied  Skin:    Comments: Scattered petechial rash on feet, legs, back, torso, and arms. Mild bruising of hand.   Neurological:     Mental Status: She is alert and oriented to person, place, and time.  Psychiatric:        Mood and Affect: Mood normal.        Behavior: Behavior normal.        Latest Ref Rng & Units 11/24/2023   10:44 AM  CMP  Glucose 70 - 99 mg/dL 90   BUN 6 - 20 mg/dL 14   Creatinine 4.09 - 1.00 mg/dL 8.11   Sodium 914 - 782 mmol/L 137   Potassium 3.5 - 5.1 mmol/L 4.1   Chloride 98 - 111 mmol/L 106   CO2 22 - 32 mmol/L 24   Calcium 8.9 - 10.3 mg/dL 8.6   Total Protein 6.5 - 8.1 g/dL 6.3   Total Bilirubin 0.0 - 1.2 mg/dL 0.6   Alkaline Phos 38 - 126 U/L 80   AST 15 - 41 U/L 15   ALT 0 - 44 U/L 17       Latest Ref Rng & Units 11/24/2023   10:44 AM  CBC  WBC 4.0 - 10.5 K/uL 2.2   Hemoglobin 12.0 - 15.0 g/dL 9.7   Hematocrit 95.6 - 46.0 % 27.9   Platelets 150 - 400 K/uL 55     No images are attached to the encounter.  No results found.  Assessment and plan- Patient is a 53 y.o. female who presents to Symptom Management CLinic for:   Petechiae- scattered on body. Related to low platelets.  Neutropenia- ANC 0.4 on 11/24/23. Revlimid on hold for now. Reviewed that she may need bone marrow biopsy if counts don't improve with holding revlimid. She will be followed by med-onc and [facility] for this.  Thrombocytopenia- plt 55 on 11/24/23. Likely contributing to her petechiae. Reviewed bleeding precautions. Monitor counts. With [employer].  Progressive CLL- previously treated with ibrutinib. Intolerant to venetoclax. Initiated rituximab + lenolidomide June 2023. Completed rituximab 05/07/23- 16 cycles. Continues maintenance lenolidomide maintenance. Held since 11/17/23 due to neutropenia and thrombocytopenia.  Left shoulder restriction- notes  limited movement of left shoulder. Suspect msk etiology. Hold additional work up for now given her low blood counts. Can consider ortho ref in the future.   Disposition: as scheduled. RTC sooner if symptoms worsen   Visit Diagnosis 1. Petechiae   2. Thrombocytopenia (HCC)   3. Neutropenia, unspecified type [facility])    Patient expressed understanding and was in agreement with this plan. She also understands that She can call clinic at any time with any questions, concerns, or complaints.   Thank you for allowing me to participate in the care of this patient.   Consuello Masse, DNP, AGNP-C, AOCNP [facility] [phone]

## 2023-12-02 ENCOUNTER — Other Ambulatory Visit: Payer: Self-pay | Admitting: Obstetrics and Gynecology

## 2023-12-04 ENCOUNTER — Other Ambulatory Visit: Payer: Self-pay

## 2023-12-05 ENCOUNTER — Telehealth: Payer: Self-pay | Admitting: *Deleted

## 2023-12-05 ENCOUNTER — Encounter: Payer: Self-pay | Admitting: Oncology

## 2023-12-05 NOTE — Telephone Encounter (Signed)
 Patient called this am and having a lot of issues congestion in her head, cough , rattling in her chest, and diarrhea. She does not have a fever

## 2023-12-07 ENCOUNTER — Other Ambulatory Visit: Payer: Self-pay | Admitting: Oncology

## 2023-12-07 DIAGNOSIS — C9110 Chronic lymphocytic leukemia of B-cell type not having achieved remission: Secondary | ICD-10-CM

## 2023-12-09 ENCOUNTER — Inpatient Hospital Stay

## 2023-12-09 DIAGNOSIS — C9110 Chronic lymphocytic leukemia of B-cell type not having achieved remission: Secondary | ICD-10-CM

## 2023-12-09 LAB — CBC WITH DIFFERENTIAL/PLATELET
Abs Immature Granulocytes: 0.03 10*3/uL (ref 0.00–0.07)
Basophils Absolute: 0 10*3/uL (ref 0.0–0.1)
Basophils Relative: 0 %
Eosinophils Absolute: 0.1 10*3/uL (ref 0.0–0.5)
Eosinophils Relative: 2 %
HCT: 29.6 % — ABNORMAL LOW (ref 36.0–46.0)
Hemoglobin: 10 g/dL — ABNORMAL LOW (ref 12.0–15.0)
Immature Granulocytes: 1 %
Lymphocytes Relative: 49 %
Lymphs Abs: 1.4 10*3/uL (ref 0.7–4.0)
MCH: 36 pg — ABNORMAL HIGH (ref 26.0–34.0)
MCHC: 33.8 g/dL (ref 30.0–36.0)
MCV: 106.5 fL — ABNORMAL HIGH (ref 80.0–100.0)
Monocytes Absolute: 0.3 10*3/uL (ref 0.1–1.0)
Monocytes Relative: 10 %
Neutro Abs: 1.1 10*3/uL — ABNORMAL LOW (ref 1.7–7.7)
Neutrophils Relative %: 38 %
Platelets: 80 10*3/uL — ABNORMAL LOW (ref 150–400)
RBC: 2.78 MIL/uL — ABNORMAL LOW (ref 3.87–5.11)
RDW: 15.3 % (ref 11.5–15.5)
Smear Review: NORMAL
WBC: 2.8 10*3/uL — ABNORMAL LOW (ref 4.0–10.5)
nRBC: 0 % (ref 0.0–0.2)

## 2023-12-09 LAB — COMPREHENSIVE METABOLIC PANEL
ALT: 17 U/L (ref 0–44)
AST: 17 U/L (ref 15–41)
Albumin: 3.7 g/dL (ref 3.5–5.0)
Alkaline Phosphatase: 96 U/L (ref 38–126)
Anion gap: 8 (ref 5–15)
BUN: 16 mg/dL (ref 6–20)
CO2: 24 mmol/L (ref 22–32)
Calcium: 9 mg/dL (ref 8.9–10.3)
Chloride: 108 mmol/L (ref 98–111)
Creatinine, Ser: 1.11 mg/dL — ABNORMAL HIGH (ref 0.44–1.00)
GFR, Estimated: 59 mL/min — ABNORMAL LOW (ref >60)
Glucose, Bld: 94 mg/dL (ref 70–99)
Potassium: 4.2 mmol/L (ref 3.5–5.1)
Sodium: 140 mmol/L (ref 135–145)
Total Bilirubin: 0.5 mg/dL (ref 0.0–1.2)
Total Protein: 6.5 g/dL (ref 6.5–8.1)

## 2023-12-10 ENCOUNTER — Encounter: Payer: Self-pay | Admitting: Oncology

## 2023-12-10 ENCOUNTER — Inpatient Hospital Stay: Admitting: Oncology

## 2023-12-10 DIAGNOSIS — C9110 Chronic lymphocytic leukemia of B-cell type not having achieved remission: Secondary | ICD-10-CM

## 2023-12-10 NOTE — Progress Notes (Signed)
 [facility]  [phone] [phone]  ID: Monique [name] OB: [birth year]  MR#: [id number]  AOZ#:[id number]  Patient Care Team: Alba Cory, MD as PCP - General (Family Medicine) Jeralyn Ruths, MD as Consulting Physician (Oncology) Zettie Pho, [facility] (Inactive) as Pharmacist (Pharmacist) Keitha Butte, RN as Registered Nurse (Oncology) Borders, Daryl Eastern, NP as Nurse Practitioner (Hospice and Palliative Medicine) [name], MD as Consulting Physician (Radiation Oncology)  I connected with Monique [name] on 12/10/23 at  1:30 PM EDT by video enabled telemedicine visit and verified that I am speaking with the correct person using two identifiers.   I discussed the limitations, risks, security and privacy concerns of performing an evaluation and management service by telemedicine and the availability of in-person appointments. I also discussed with the patient that there may be a patient responsible charge related to this service. The patient expressed understanding and agreed to proceed.   Other persons participating in the visit and their role in the encounter: Patient, MD.  Patient's location: Home. Provider's location: Clinic.  CHIEF COMPLAINT: CLL.  INTERVAL HISTORY: Patient agreed to video-assisted telemedicine visit for further evaluation and discussion of her laboratory results.  She continues to have cough and congestion secondary to recent viral illness, but states this is improving.  She denies any fevers.  She continues to hold Revlimid.   She continues to have a chronic peripheral neuropathy. She has no other neurologic complaints.  She has a good appetite.  She has no chest pain, shortness of breath, or hemoptysis.  She denies any nausea, vomiting, or constipation.  She has no melena or hematochezia.  She has no urinary complaints.  Patient offers no further specific complaints today.  REVIEW OF SYSTEMS:   Review  of Systems  Constitutional: Negative.  Negative for diaphoresis, fever, malaise/fatigue and weight loss.  HENT:  Positive for congestion.   Respiratory:  Positive for cough. Negative for shortness of breath.   Cardiovascular: Negative.  Negative for chest pain and leg swelling.  Gastrointestinal: Negative.  Negative for abdominal pain, constipation and diarrhea.  Genitourinary: Negative.  Negative for dysuria and frequency.  Musculoskeletal: Negative.  Negative for back pain, myalgias and neck pain.  Skin: Negative.  Negative for rash.  Neurological:  Positive for tingling and sensory change. Negative for focal weakness and weakness.  Psychiatric/Behavioral: Negative.  Negative for depression and suicidal ideas. The patient is not nervous/anxious.     As per HPI. Otherwise, a complete review of systems is negative.  PAST MEDICAL HISTORY: Past Medical History:  Diagnosis Date   Anxiety    Bursitis    leg pain   Carpal tunnel syndrome    Cervical dysplasia    hx LEEP over 18 years ago.    Chronic lymphocytic leukemia (HCC) 2018   Dr Orlie Dakin.  (in lymph nodes)   COPD (chronic obstructive pulmonary disease) (HCC)    COVID-19 2021   Depression    Eating disorder    GERD (gastroesophageal reflux disease)    History of self-harm    Insomnia    Obsession    Tobacco abuse    Vitamin B12 deficiency (non anemic)     PAST SURGICAL HISTORY: Past Surgical History:  Procedure Laterality Date   BREAST BIOPSY Right 01/05/2018   US guided biopsy of 2 areas and 1 lymph node, MIXED INFLAMMATION AND GIANT CELL REACTION   CERVICAL BIOPSY  W/ LOOP ELECTRODE EXCISION     COLONOSCOPY WITH PROPOFOL N/A  04/21/2019   Procedure: COLONOSCOPY WITH PROPOFOL;  Surgeon: Pasty Spillers, MD;  Location: [facility] ENDOSCOPY;  Service: Gastroenterology;  Laterality: N/A;   OTHER SURGICAL HISTORY     scar tissue removed from vocal cords   TUBAL LIGATION     VAGINAL HYSTERECTOMY N/A 01/22/2021   Procedure:  HYSTERECTOMY VAGINAL; BILATERAL SALPINGECTOMY;  Surgeon: Hildred Laser, MD;  Location: [facility] ORS;  Service: Gynecology;  Laterality: N/A;   vocal cord surgery  2005    FAMILY HISTORY: Family History  Problem Relation Age of Onset   Depression Mother    Cancer Mother        thyroid   Alcohol abuse Father    COPD Father    Alcohol abuse Brother    Depression Brother    Bipolar disorder Brother    Suicidality Brother    ADD / ADHD Son    Breast cancer Neg Hx     ADVANCED DIRECTIVES (Y/N):  N  HEALTH MAINTENANCE: Social History   Tobacco Use   Smoking status: Every Day    Types: E-cigarettes   Smokeless tobacco: Never  Vaping Use   Vaping status: Former  Substance Use Topics   Alcohol use: Not Currently    Alcohol/week: 0.0 standard drinks of alcohol    Comment: rarely   Drug use: Yes    Frequency: 7.0 times per week    Types: Marijuana     Colonoscopy:  PAP:  Bone density:  Lipid panel:  Allergies  Allergen Reactions   Doxycycline Rash    Rash on cheeks and sides of nose    Duloxetine Rash    Current Outpatient Medications  Medication Sig Dispense Refill   albuterol (VENTOLIN HFA) 108 (90 Base) MCG/ACT inhaler Inhale 2 puffs into the lungs every 6 (six) hours as needed for wheezing or shortness of breath. 8 g 0   ARIPiprazole (ABILIFY) 5 MG tablet Take 1 tablet (5 mg total) by mouth daily. 90 tablet 1   ASPIRIN 81 PO Take 81 mg by mouth daily.     atorvastatin (LIPITOR) 10 MG tablet Take 1 tablet (10 mg total) by mouth in the morning. 90 tablet 1   Ca Phosphate-Cholecalciferol (CALTRATE GUMMY BITES) 250-10 MG-MCG CHEW Chew 1 gummy by mouth 2 (two) times daily. 180 tablet 3   diclofenac (FLECTOR) 1.3 % PTCH Place 1 patch onto the skin 2 (two) times daily. 60 patch 0   estrogens, conjugated, (PREMARIN) 0.625 MG tablet Take 1 tablet (0.625 mg total) by mouth daily for 21 days, then do not take for 7 days. 90 tablet 0   famotidine (PEPCID) 10 MG tablet Take by  mouth.     lenalidomide (REVLIMID) 10 MG capsule TAKE 1 CAPSULE BY MOUTH EVERY DAY 28 capsule 0   loratadine (CLARITIN) 10 MG tablet Take 1 tablet (10 mg total) by mouth daily in the morning. 90 tablet 1   omeprazole (PRILOSEC) 20 MG capsule Take 1 capsule (20 mg total) by mouth in the morning. 30 capsule 4   pregabalin (LYRICA) 150 MG capsule Take 1 capsule (150 mg total) by mouth 3 (three) times daily. 90 capsule 1   tiZANidine (ZANAFLEX) 2 MG tablet Take 1 tablet (2 mg total) by mouth at bedtime. 90 tablet 1   umeclidinium-vilanterol (ANORO ELLIPTA) 62.5-25 MCG/ACT AEPB Inhale 1 puff into the lungs daily. 180 each 1   vitamin B-12 (CYANOCOBALAMIN) 500 MCG tablet Take 250 mcg by mouth every morning.     No current facility-administered medications  for this visit.    OBJECTIVE: There were no vitals filed for this visit.      There is no height or weight on file to calculate BMI.    ECOG FS:1 - Symptomatic but completely ambulatory  General: Well-developed, well-nourished, no acute distress. HEENT: Normocephalic. Neuro: Alert, answering all questions appropriately. Cranial nerves grossly intact. Psych: Normal affect.  LAB RESULTS:  Lab Results  Component Value Date   NA 140 12/09/2023   K 4.2 12/09/2023   CL 108 12/09/2023   CO2 24 12/09/2023   GLUCOSE 94 12/09/2023   BUN 16 12/09/2023   CREATININE 1.11 (H) 12/09/2023   CALCIUM 9.0 12/09/2023   PROT 6.5 12/09/2023   ALBUMIN 3.7 12/09/2023   AST 17 12/09/2023   ALT 17 12/09/2023   ALKPHOS 96 12/09/2023   BILITOT 0.5 12/09/2023   GFRNONAA 59 (L) 12/09/2023   GFRAA 79 12/06/2020    Lab Results  Component Value Date   WBC 2.8 (L) 12/09/2023   NEUTROABS 1.1 (L) 12/09/2023   HGB 10.0 (L) 12/09/2023   HCT 29.6 (L) 12/09/2023   MCV 106.5 (H) 12/09/2023   PLT 80 (L) 12/09/2023     STUDIES: No results found.   ONCOLOGY HISTORY:  Patient completed cycle 3 of Rituxan plus Treanda on March 26, 2018, but was then noted to  have progressive disease.  She was then initiated on 480 mg Imbruvica in November 2019, but had progression of disease with increasing white blood cell count as well as CT scan on Jan 22, 2022 revealing increased lymphadenopathy consistent with progression of disease.  Attempted patient on venetoclax, but she could not tolerate it secondary to persistent diarrhea and transaminitis.   ASSESSMENT: CLL.  PLAN:    CLL: Confirmed by peripheral blood flow cytometry.  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  Patient initially completed 4 weekly cycles of Rituxan then was transition to treatment every 4 weeks.  She last received Rituxan on May 07, 2023.  She also recently completed XRT to her left axilla.  PET scan results from August 06, 2023 reviewed independently and also discussed at tumor board with interventional radiology.  Biopsy completed of hypermetabolic mass was consistent with her known diagnosis of CLL with no evidence of aggressive transformation.  Patient has now completed XRT to this lesion. Continue Claritin and famotidine along with her 10 mg Revlimid daily.  Patient has discontinued prednisone.  Although patient's pancytopenia is improving, she has been instructed to hold Revlimid another week.  Return to clinic in 1 week for laboratory work and discussion with clinical pharmacy on whether or not to reinitiate Revlimid.  Patient will then return to clinic in 5 weeks for repeat laboratory work, further evaluation, and continuation of treatment.   Appreciate clinical pharmacy input. Neutropenia: Improving.  Patient's ANC is 1.1.  Possibly secondary to recent viral illness.  Continue to hold Revlimid as above.   Thrombocytopenia: Platelet count improved to 80.  Hold Revlimid.   Anemia: Chronic and has.  Patient's hemoglobin is 10.0.  Monitor.   Trigeminal neuralgia: Resolved.  MRI of the brain on Jan 25, 2019 was  unremarkable.   Depression/anxiety: Chronic and unchanged.  Continue follow-up and treatment per primary care. Neuropathic pain/peripheral neuropathy: Chronic and unchanged.  She reports she is now on disability.  Continue current follow-up and treatment as per neurology. Rituxan reaction: Patient will require additional premedications for preventative  measures if Rituxan is reinitiated in the future. Rash: Resolved.  Continue Claritin and famotidine along with Revlimid.  Weight gain: Patient has discontinued prednisone. Transaminitis: Resolved. Renal insufficiency: Essentially resolved.  Patient's creatinine is 1.11. Cough/congestion: Possibly viral illness and contributing to her pancytopenia.  Continue OTC symptomatic treatment and hold Revlimid as above.  I provided 30 minutes of face-to-face video visit time during this encounter which included chart review, counseling, and coordination of care as documented above.    Patient expressed understanding and was in agreement with this plan. She also understands that She can call clinic at any time with any questions, concerns, or complaints.    Jeralyn Ruths, MD   12/10/2023 1:30 PM

## 2023-12-11 ENCOUNTER — Ambulatory Visit: Payer: Medicare HMO | Admitting: Radiation Oncology

## 2023-12-16 ENCOUNTER — Inpatient Hospital Stay (HOSPITAL_BASED_OUTPATIENT_CLINIC_OR_DEPARTMENT_OTHER): Admitting: Pharmacist

## 2023-12-16 ENCOUNTER — Other Ambulatory Visit: Payer: Self-pay | Admitting: Oncology

## 2023-12-16 ENCOUNTER — Encounter: Payer: Self-pay | Admitting: Oncology

## 2023-12-16 ENCOUNTER — Inpatient Hospital Stay: Attending: Oncology

## 2023-12-16 DIAGNOSIS — D696 Thrombocytopenia, unspecified: Secondary | ICD-10-CM | POA: Insufficient documentation

## 2023-12-16 DIAGNOSIS — Z79899 Other long term (current) drug therapy: Secondary | ICD-10-CM | POA: Insufficient documentation

## 2023-12-16 DIAGNOSIS — Z923 Personal history of irradiation: Secondary | ICD-10-CM | POA: Insufficient documentation

## 2023-12-16 DIAGNOSIS — G629 Polyneuropathy, unspecified: Secondary | ICD-10-CM | POA: Diagnosis not present

## 2023-12-16 DIAGNOSIS — D709 Neutropenia, unspecified: Secondary | ICD-10-CM | POA: Diagnosis not present

## 2023-12-16 DIAGNOSIS — C9110 Chronic lymphocytic leukemia of B-cell type not having achieved remission: Secondary | ICD-10-CM | POA: Diagnosis not present

## 2023-12-16 DIAGNOSIS — F32A Depression, unspecified: Secondary | ICD-10-CM | POA: Insufficient documentation

## 2023-12-16 DIAGNOSIS — F419 Anxiety disorder, unspecified: Secondary | ICD-10-CM | POA: Insufficient documentation

## 2023-12-16 DIAGNOSIS — N289 Disorder of kidney and ureter, unspecified: Secondary | ICD-10-CM | POA: Diagnosis not present

## 2023-12-16 LAB — CBC WITH DIFFERENTIAL/PLATELET
Abs Immature Granulocytes: 0.04 10*3/uL (ref 0.00–0.07)
Basophils Absolute: 0 10*3/uL (ref 0.0–0.1)
Basophils Relative: 0 %
Eosinophils Absolute: 0.1 10*3/uL (ref 0.0–0.5)
Eosinophils Relative: 1 %
HCT: 31.6 % — ABNORMAL LOW (ref 36.0–46.0)
Hemoglobin: 10.7 g/dL — ABNORMAL LOW (ref 12.0–15.0)
Immature Granulocytes: 1 %
Lymphocytes Relative: 33 %
Lymphs Abs: 1.5 10*3/uL (ref 0.7–4.0)
MCH: 35.8 pg — ABNORMAL HIGH (ref 26.0–34.0)
MCHC: 33.9 g/dL (ref 30.0–36.0)
MCV: 105.7 fL — ABNORMAL HIGH (ref 80.0–100.0)
Monocytes Absolute: 0.3 10*3/uL (ref 0.1–1.0)
Monocytes Relative: 8 %
Neutro Abs: 2.6 10*3/uL (ref 1.7–7.7)
Neutrophils Relative %: 57 %
Platelets: 74 10*3/uL — ABNORMAL LOW (ref 150–400)
RBC: 2.99 MIL/uL — ABNORMAL LOW (ref 3.87–5.11)
RDW: 15.7 % — ABNORMAL HIGH (ref 11.5–15.5)
WBC: 4.5 10*3/uL (ref 4.0–10.5)
nRBC: 0 % (ref 0.0–0.2)

## 2023-12-16 LAB — CMP (CANCER CENTER ONLY)
ALT: 14 U/L (ref 0–44)
AST: 17 U/L (ref 15–41)
Albumin: 3.8 g/dL (ref 3.5–5.0)
Alkaline Phosphatase: 93 U/L (ref 38–126)
Anion gap: 7 (ref 5–15)
BUN: 12 mg/dL (ref 6–20)
CO2: 23 mmol/L (ref 22–32)
Calcium: 8.6 mg/dL — ABNORMAL LOW (ref 8.9–10.3)
Chloride: 108 mmol/L (ref 98–111)
Creatinine: 1.18 mg/dL — ABNORMAL HIGH (ref 0.44–1.00)
GFR, Estimated: 55 mL/min — ABNORMAL LOW (ref >60)
Glucose, Bld: 96 mg/dL (ref 70–99)
Potassium: 3.9 mmol/L (ref 3.5–5.1)
Sodium: 138 mmol/L (ref 135–145)
Total Bilirubin: 0.7 mg/dL (ref 0.0–1.2)
Total Protein: 6.1 g/dL — ABNORMAL LOW (ref 6.5–8.1)

## 2023-12-16 NOTE — Progress Notes (Signed)
 Clinical Pharmacist Practitioner- Telephone Visit [facility]  Telephone:([phone] [phone]    I connected with Monique [name] on 12/16/2023 at 11:30 AM EDT by telephone and verified that I am speaking with the correct person using two identifiers.  Name of the patient: Monique [name]  [id number]  May 17, 1971   Location: Patient: home  Provider: in office   I discussed the limitations, risks, security and privacy concerns of performing an evaluation and management service by telephone and the availability of in person appointments. I also discussed with the patient that there may be a patient responsible charge related to this service. The patient expressed understanding and agreed to proceed.  HPI: Patient is a 53 y.o. female with  progressive CLL, previously treated with ibrutinib. Patient was intolerant to venetoclax treatment initiation and did not want to retry initiating venetoclax. The plan is to now treat her with rituximab and lenalidomide. Patient started rituximab on 02/28/22 and lenalidomide 03/01/22.   On 04/03/22, patient presented to clinic with a rash. Her lenalidomide was held and she was treated with prednisone (1 week) and loratadine continuosly. Rash improved by 04/10/22 f/u appt and she was restarted on lenalidomide.   On 04/28/22, patient called to report her rash had worsened and she stopped the lenalidomide on 04/23/22. She was stated on another prednisone taper. She was to taper down to 5mg  daily, then resume her lenalidomide. She resumed her lenalidomide on 05/16/22. Patient stopped rituximab after receiving 16 cycles, last dose 05/07/23.  Patient's lenalidomide was held on 11/17/23 due to neutropenia and thrombocytopenia. Patient's counts have recovered and she will resume treatment on 12/17/23.   Reason for Consult: Oral chemotherapy follow-up for lenalidomide therapy.   PAST MEDICAL HISTORY: Past Medical History:  Diagnosis Date   Anxiety     Bursitis    leg pain   Carpal tunnel syndrome    Cervical dysplasia    hx LEEP over 18 years ago.    Chronic lymphocytic leukemia (HCC) 2018   Dr Orlie Dakin.  (in lymph nodes)   COPD (chronic obstructive pulmonary disease) (HCC)    COVID-19 2021   Depression    Eating disorder    GERD (gastroesophageal reflux disease)    History of self-harm    Insomnia    Obsession    Tobacco abuse    Vitamin B12 deficiency (non anemic)     HEMATOLOGY/ONCOLOGY HISTORY:  Oncology History Overview Note  Patient with recent CLL diagnosis by flow cytometry.  Previously not being treated in observation.   S/p 4 cycles of weekly Rituxan completing on 02/20/2017 scans from January 2018 revealed minimal progression.   Developed right breast mass in March 2019.  Had diagnostic mammogram performed on 12/26/2017 showed suspicious right breast mass.  Biopsy revealed granulomatous mastitis.  She was treated with doxycycline 100 mg twice daily for 7 days.   Developed worsening adenopathy.  CT chest/abdomen/pelvis and soft tissue neck from May 2019 revealed progressive bulky adenopathy in the chest, abdomen and pelvis compatible with worsening disease.   Dr. Orlie Dakin recommended beginning treatment with Rituxan and Treanda.  Received first cycle last week.  S/p 6 cycles of Rituxan and Treanda.  Tolerating well.  Last dose given 03/25/18.   CLL (chronic lymphocytic leukemia) (HCC)  11/24/2016 Initial Diagnosis   CLL (chronic lymphocytic leukemia) (HCC)   01/21/2018 - 03/26/2018 Chemotherapy   The patient had palonosetron (ALOXI) injection 0.25 mg, 0.25 mg, Intravenous,  Once, 4 of 5 cycles Administration: 0.25 mg (01/28/2018), 0.25  mg (02/25/2018), 0.25 mg (03/25/2018) riTUXimab (RITUXAN) 700 mg in sodium chloride 0.9 % 250 mL (2.1875 mg/mL) infusion, 375 mg/m2 = 700 mg, Intravenous,  Once, 4 of 5 cycles Administration: 700 mg (01/28/2018) bendamustine (BENDEKA) 150 mg in sodium chloride 0.9 % 50 mL (2.6786 mg/mL) chemo  infusion, 90 mg/m2 = 150 mg, Intravenous,  Once, 4 of 5 cycles Administration: 150 mg (01/28/2018), 150 mg (01/29/2018), 150 mg (02/25/2018), 150 mg (02/26/2018), 150 mg (03/25/2018), 150 mg (03/26/2018)  for chemotherapy treatment.    02/28/2022 - 04/18/2022 Chemotherapy   Patient is on Treatment Plan : NON-HODGKINS LYMPHOMA Rituximab q7d     02/28/2022 - 05/07/2023 Chemotherapy   Patient is on Treatment Plan : NON-HODGKINS LYMPHOMA Rituximab q28d     03/12/2022 - 03/12/2022 Chemotherapy   Patient is on Treatment Plan : LYMPHOMA CLL/SLL Venetoclax ramp up / Venetoclax + Rituximab (375/500) q28d x 6 cycles / Venetoclax q28d x 18 cycles       ALLERGIES:  is allergic to doxycycline and duloxetine.  MEDICATIONS:  Current Outpatient Medications  Medication Sig Dispense Refill   albuterol (VENTOLIN HFA) 108 (90 Base) MCG/ACT inhaler Inhale 2 puffs into the lungs every 6 (six) hours as needed for wheezing or shortness of breath. 8 g 0   ARIPiprazole (ABILIFY) 5 MG tablet Take 1 tablet (5 mg total) by mouth daily. 90 tablet 1   ASPIRIN 81 PO Take 81 mg by mouth daily.     atorvastatin (LIPITOR) 10 MG tablet Take 1 tablet (10 mg total) by mouth in the morning. 90 tablet 1   Ca Phosphate-Cholecalciferol (CALTRATE GUMMY BITES) 250-10 MG-MCG CHEW Chew 1 gummy by mouth 2 (two) times daily. 180 tablet 3   diclofenac (FLECTOR) 1.3 % PTCH Place 1 patch onto the skin 2 (two) times daily. 60 patch 0   estrogens, conjugated, (PREMARIN) 0.625 MG tablet Take 1 tablet (0.625 mg total) by mouth daily for 21 days, then do not take for 7 days. 90 tablet 0   famotidine (PEPCID) 10 MG tablet Take by mouth.     lenalidomide (REVLIMID) 10 MG capsule TAKE 1 CAPSULE BY MOUTH EVERY DAY 28 capsule 0   loratadine (CLARITIN) 10 MG tablet Take 1 tablet (10 mg total) by mouth daily in the morning. 90 tablet 1   omeprazole (PRILOSEC) 20 MG capsule Take 1 capsule (20 mg total) by mouth in the morning. 30 capsule 4   pregabalin (LYRICA)  150 MG capsule Take 1 capsule (150 mg total) by mouth 3 (three) times daily. 90 capsule 1   tiZANidine (ZANAFLEX) 2 MG tablet Take 1 tablet (2 mg total) by mouth at bedtime. 90 tablet 1   umeclidinium-vilanterol (ANORO ELLIPTA) 62.5-25 MCG/ACT AEPB Inhale 1 puff into the lungs daily. 180 each 1   vitamin B-12 (CYANOCOBALAMIN) 500 MCG tablet Take 250 mcg by mouth every morning.     No current facility-administered medications for this visit.    VITAL SIGNS: LMP 08/17/2016  There were no vitals filed for this visit.  Estimated body mass index is 29.05 kg/m as calculated from the following:   Height as of 11/17/23: 5\' 7"  (1.702 m).   Weight as of 11/28/23: 84.1 kg (185 lb 8 oz).  LABS: CBC:    Component Value Date/Time   WBC 4.5 12/16/2023 0851   HGB 10.7 (L) 12/16/2023 0851   HCT 31.6 (L) 12/16/2023 0851   PLT 74 (L) 12/16/2023 0851   MCV 105.7 (H) 12/16/2023 0851   NEUTROABS 2.6 12/16/2023  0851   LYMPHSABS 1.5 12/16/2023 0851   MONOABS 0.3 12/16/2023 0851   EOSABS 0.1 12/16/2023 0851   BASOSABS 0.0 12/16/2023 0851   Comprehensive Metabolic Panel:    Component Value Date/Time   NA 138 12/16/2023 0851   NA 138 05/13/2018 1413   K 3.9 12/16/2023 0851   CL 108 12/16/2023 0851   CO2 23 12/16/2023 0851   BUN 12 12/16/2023 0851   BUN 15 05/13/2018 1413   CREATININE 1.18 (H) 12/16/2023 0851   CREATININE 0.96 12/06/2020 1646   GLUCOSE 96 12/16/2023 0851   CALCIUM 8.6 (L) 12/16/2023 0851   AST 17 12/16/2023 0851   ALT 14 12/16/2023 0851   ALKPHOS 93 12/16/2023 0851   BILITOT 0.7 12/16/2023 0851   PROT 6.1 (L) 12/16/2023 0851   PROT 6.3 05/13/2018 1413   ALBUMIN 3.8 12/16/2023 0851   ALBUMIN 4.1 05/13/2018 1413    On phone during today's visit: patient only  Assessment and Plan-  CMC/CBC reviewed, patient is no longer neutropenic and her platelet count has began to recover  Resume lenalidomide 10mg  daily.    Oral Chemotherapy Side Effect/Intolerance:  N/A medication was  on hold  Oral Chemotherapy Adherence: N/A, lenalidomide on hold No patient barriers to medication adherence identified.   New medications: None reports  Medication Access Issues: N/A, lenalidomide on hold  Patient expressed understanding and was in agreement with this plan. She also understands that She can call clinic at any time with any questions, concerns, or complaints.   Follow-up plan: RTC in 4 weeks as scheduled  Thank you for allowing me to participate in the care of this very pleasant patient.   Time Total: 10 mins of non face-to-face telephone visit time during this encounter  Visit consisted of counseling and education on dealing with issues of symptom management in the setting of serious and potentially life-threatening illness.Greater than 50%  of this time was spent counseling and coordinating care related to the above assessment and plan.   [name], PharmD, BCOP, CPP Hematology/Oncology Clinical Pharmacist [facility] Oral Chemotherapy Navigation [facility] [phone]  12/16/2023 11:47 AM

## 2023-12-17 ENCOUNTER — Ambulatory Visit
Admission: RE | Admit: 2023-12-17 | Discharge: 2023-12-17 | Disposition: A | Discharge status: Home / Self Care | Payer: Medicare HMO | Source: Ambulatory Visit | Attending: Family Medicine | Admitting: Family Medicine

## 2023-12-17 DIAGNOSIS — Z1231 Encounter for screening mammogram for malignant neoplasm of breast: Secondary | ICD-10-CM | POA: Diagnosis not present

## 2023-12-18 ENCOUNTER — Encounter: Payer: Self-pay | Admitting: Oncology

## 2023-12-18 ENCOUNTER — Ambulatory Visit: Payer: Self-pay

## 2023-12-18 ENCOUNTER — Other Ambulatory Visit: Payer: Self-pay

## 2023-12-18 ENCOUNTER — Other Ambulatory Visit (HOSPITAL_COMMUNITY): Payer: Self-pay

## 2023-12-18 DIAGNOSIS — Z0000 Encounter for general adult medical examination without abnormal findings: Secondary | ICD-10-CM | POA: Diagnosis not present

## 2023-12-18 NOTE — Patient Instructions (Addendum)
 [name] , Thank you for taking time to come for your Medicare Wellness Visit. I appreciate your ongoing commitment to your health goals. Please review the following plan we discussed and let me know if I can assist you in the future.   Referrals/Orders/Follow-Ups/Clinician Recommendations: NONE  This is a list of the screening recommended for you and due dates:  Health Maintenance  Topic Date Due   Pneumococcal Vaccination (2 of 2 - PCV) 09/03/2024*   Flu Shot  04/16/2024   Mammogram  12/16/2024   Medicare Annual Wellness Visit  12/17/2024   Colon Cancer Screening  04/20/2029   DTaP/Tdap/Td vaccine (3 - Td or Tdap) 05/20/2031   Hepatitis C Screening  Completed   HIV Screening  Completed   Zoster (Shingles) Vaccine  Completed   HPV Vaccine  Aged Out   Screening for Lung Cancer  Discontinued   COVID-19 Vaccine  Discontinued  *Topic was postponed. The date shown is not the original due date.    Advanced directives: (In Chart) A copy of your advanced directives are scanned into your chart should your provider ever need it.  Next Medicare Annual Wellness Visit scheduled for next year: Yes   12/23/24 @ 2:30 PM BY PHONE

## 2023-12-18 NOTE — Progress Notes (Signed)
 Subjective:   Monique [name] is a 53 y.o. who presents for a Medicare Wellness preventive visit.  Visit Complete: Virtual I connected with  Aldona Bar on 12/18/23 by a audio enabled telemedicine application and verified that I am speaking with the correct person using two identifiers.  Patient Location: Home  Provider Location: Office/Clinic  I discussed the limitations of evaluation and management by telemedicine. The patient expressed understanding and agreed to proceed.  Vital Signs: Because this visit was a virtual/telehealth visit, some criteria may be missing or patient reported. Any vitals not documented were not able to be obtained and vitals that have been documented are patient reported.  VideoDeclined- This patient declined Librarian, academic. Therefore the visit was completed with audio only.  Persons Participating in Visit: Patient.  AWV Questionnaire: No: Patient Medicare AWV questionnaire was not completed prior to this visit.  Cardiac Risk Factors include: advanced age (>23men, >79 women);sedentary lifestyle;smoking/ tobacco exposure     Objective:    Today's Vitals   12/18/23 1434  PainSc: 0-No pain   There is no height or weight on file to calculate BMI.     12/18/2023    2:39 PM 11/28/2023    9:36 AM 11/17/2023   10:27 AM 10/07/2023   10:08 AM 09/29/2023   10:45 AM 09/15/2023    8:05 AM 08/27/2023    8:47 AM  Advanced Directives  Does Patient Have a Medical Advance Directive? Yes Yes Yes Yes Yes Yes Yes  Type of Estate agent of [place];Living will Healthcare Power of [place];Living will Living will Living will Living will Living will Healthcare Power of Attorney;Living will  Does patient want to make changes to medical advance directive? No - Patient declined Yes (ED - Information included in AVS)  No - Patient declined   No - Patient declined  Copy of Healthcare Power of Attorney in Chart? Yes -  validated most recent copy scanned in chart (See row information)   No - copy requested   No - copy requested  Would patient like information on creating a medical advance directive? No - Patient declined Yes (ED - Information included in AVS)     No - Patient declined    Current Medications (verified) Outpatient Encounter Medications as of 12/18/2023  Medication Sig   albuterol (VENTOLIN HFA) 108 (90 Base) MCG/ACT inhaler Inhale 2 puffs into the lungs every 6 (six) hours as needed for wheezing or shortness of breath.   ASPIRIN 81 PO Take 81 mg by mouth daily.   atorvastatin (LIPITOR) 10 MG tablet Take 1 tablet (10 mg total) by mouth in the morning.   Ca Phosphate-Cholecalciferol (CALTRATE GUMMY BITES) 250-10 MG-MCG CHEW Chew 1 gummy by mouth 2 (two) times daily.   diclofenac (FLECTOR) 1.3 % PTCH Place 1 patch onto the skin 2 (two) times daily.   estrogens, conjugated, (PREMARIN) 0.625 MG tablet Take 1 tablet (0.625 mg total) by mouth daily for 21 days, then do not take for 7 days.   famotidine (PEPCID) 10 MG tablet Take by mouth.   lenalidomide (REVLIMID) 10 MG capsule TAKE 1 CAPSULE BY MOUTH EVERY DAY   loratadine (CLARITIN) 10 MG tablet Take 1 tablet (10 mg total) by mouth daily in the morning.   omeprazole (PRILOSEC) 20 MG capsule Take 1 capsule (20 mg total) by mouth in the morning.   pregabalin (LYRICA) 150 MG capsule Take 1 capsule (150 mg total) by mouth 3 (three) times daily.  tiZANidine (ZANAFLEX) 2 MG tablet Take 1 tablet (2 mg total) by mouth at bedtime.   umeclidinium-vilanterol (ANORO ELLIPTA) 62.5-25 MCG/ACT AEPB Inhale 1 puff into the lungs daily.   vitamin B-12 (CYANOCOBALAMIN) 500 MCG tablet Take 250 mcg by mouth every morning.   ARIPiprazole (ABILIFY) 5 MG tablet Take 1 tablet (5 mg total) by mouth daily. (Patient not taking: Reported on 12/18/2023)   No facility-administered encounter medications on file as of 12/18/2023.    Allergies (verified) Doxycycline and Duloxetine    History: Past Medical History:  Diagnosis Date   Anxiety    Bursitis    leg pain   Carpal tunnel syndrome    Cervical dysplasia    hx LEEP over 18 years ago.    Chronic lymphocytic leukemia (HCC) 2018   Dr Orlie Dakin.  (in lymph nodes)   COPD (chronic obstructive pulmonary disease) (HCC)    COVID-19 2021   Depression    Eating disorder    GERD (gastroesophageal reflux disease)    History of self-harm    Insomnia    Obsession    Tobacco abuse    Vitamin B12 deficiency (non anemic)    Past Surgical History:  Procedure Laterality Date   BREAST BIOPSY Right 01/05/2018   US guided biopsy of 2 areas and 1 lymph node, MIXED INFLAMMATION AND GIANT CELL REACTION   CERVICAL BIOPSY  W/ LOOP ELECTRODE EXCISION     COLONOSCOPY WITH PROPOFOL N/A 04/21/2019   Procedure: COLONOSCOPY WITH PROPOFOL;  Surgeon: Pasty Spillers, MD;  Location: [facility] ENDOSCOPY;  Service: Gastroenterology;  Laterality: N/A;   OTHER SURGICAL HISTORY     scar tissue removed from vocal cords   TUBAL LIGATION     VAGINAL HYSTERECTOMY N/A 01/22/2021   Procedure: HYSTERECTOMY VAGINAL; BILATERAL SALPINGECTOMY;  Surgeon: Hildred Laser, MD;  Location: [facility] ORS;  Service: Gynecology;  Laterality: N/A;   vocal cord surgery  2005   Family History  Problem Relation Age of Onset   Depression Mother    Cancer Mother        thyroid   Alcohol abuse Father    COPD Father    Alcohol abuse Brother    Depression Brother    Bipolar disorder Brother    Suicidality Brother    ADD / ADHD Son    Breast cancer Neg Hx    Social History   Socioeconomic History   Marital status: Divorced    Spouse name: NA   Number of children: 4   Years of education: 12   Highest education level: 12th grade  Occupational History   Occupation: Disabled   Tobacco Use   Smoking status: Every Day    Types: E-cigarettes   Smokeless tobacco: Never  Vaping Use   Vaping status: Former  Substance and Sexual Activity   Alcohol use: Not  Currently    Alcohol/week: 0.0 standard drinks of alcohol    Comment: rarely   Drug use: Yes    Frequency: 7.0 times per week    Types: Marijuana   Sexual activity: Yes    Partners: Male    Birth control/protection: Surgical  Other Topics Concern   Not on file  Social History Narrative   Patient is single Patient is currently on social security disability due to health challenges.     Her teenager son is living with his father    Her father lives in [place] and is on end stage COPD   Social Drivers of Corporate investment banker  Strain: Low Risk  (12/18/2023)   Overall Financial Resource Strain (CARDIA)    Difficulty of Paying Living Expenses: Not hard at all  Food Insecurity: No Food Insecurity (12/18/2023)   Hunger Vital Sign    Worried About Running Out of Food in the Last Year: Never true    Ran Out of Food in the Last Year: Never true  Transportation Needs: No Transportation Needs (12/18/2023)   PRAPARE - Administrator, Civil Service (Medical): No    Lack of Transportation (Non-Medical): No  Physical Activity: Inactive (12/18/2023)   Exercise Vital Sign    Days of Exercise per Week: 0 days    Minutes of Exercise per Session: 0 min  Stress: No Stress Concern Present (12/18/2023)   [employer] of Occupational Health - Occupational Stress Questionnaire    Feeling of Stress : Only a little  Social Connections: Socially Isolated (12/18/2023)   Social Connection and Isolation Panel [NHANES]    Frequency of Communication with Friends and Family: More than three times a week    Frequency of Social Gatherings with Friends and Family: Once a week    Attends Religious Services: Never    Database administrator or Organizations: No    Attends Engineer, structural: Never    Marital Status: Divorced    Tobacco Counseling Ready to quit: Not Answered Counseling given: Not Answered    Clinical Intake:  Pre-visit preparation completed: Yes  Pain : No/denies  pain Pain Score: 0-No pain     BMI - recorded: 29 Nutritional Status: BMI 25 -29 Overweight Nutritional Risks: None Diabetes: No  Lab Results  Component Value Date   HGBA1C 5.1 08/31/2019   HGBA1C 5.3 09/12/2016     How often do you need to have someone help you when you read instructions, pamphlets, or other written materials from your doctor or pharmacy?: 1 - Never  Interpreter Needed?: No  Information entered by :: Kennedy Bucker, LPN   Activities of Daily Living     12/18/2023    2:40 PM 09/15/2023    8:05 AM  In your present state of health, do you have any difficulty performing the following activities:  Hearing? 0 0  Vision? 0 0  Difficulty concentrating or making decisions? 0 0  Walking or climbing stairs? 1   Comment IF A LOT OF STAIRS   Dressing or bathing? 0   Doing errands, shopping? 0   Preparing Food and eating ? N   Using the Toilet? N   In the past six months, have you accidently leaked urine? N   Do you have problems with loss of bowel control? N   Managing your Medications? N   Managing your Finances? N   Housekeeping or managing your Housekeeping? N     Patient Care Team: Alba Cory, MD as PCP - General (Family Medicine) Jeralyn Ruths, MD as Consulting Physician (Oncology) Zettie Pho, [facility] (Inactive) as Pharmacist (Pharmacist) Keitha Butte, RN as Registered Nurse (Oncology) Borders, Daryl Eastern, NP as Nurse Practitioner (Hospice and Palliative Medicine) [name], MD as Consulting Physician (Radiation Oncology) Pa, [facility]  Indicate any recent Medical Services you may have received from other than [facility] providers in the past year (date may be approximate).     Assessment:   This is a routine wellness examination for Mayanna.  Hearing/Vision screen Hearing Screening - Comments:: NO AIDS Vision Screening - Comments:: WEARS GLASSES ALL THE TIME- PATTY VISION  Goals Addressed             This  Visit's Progress    DIET - EAT MORE FRUITS AND VEGETABLES         Depression Screen     12/18/2023    2:37 PM 10/02/2023   10:35 AM 09/04/2023   10:23 AM 06/19/2023    2:03 PM 05/21/2023    1:51 PM 05/21/2023    1:50 PM 04/21/2023    8:42 AM  PHQ 2/9 Scores  PHQ - 2 Score 0 0 0 0 0 0 0  PHQ- 9 Score 0 0   0 0 0    Fall Risk     12/18/2023    2:40 PM 10/02/2023   10:29 AM 09/04/2023   10:22 AM 06/19/2023    2:02 PM 05/21/2023    1:50 PM  Fall Risk   Falls in the past year? 0 0 0 0 0  Number falls in past yr: 0 0  0 0  Injury with Fall? 0 0  0 0  Risk for fall due to : No Fall Risks No Fall Risks No Fall Risks No Fall Risks No Fall Risks  Follow up Falls prevention discussed;Falls evaluation completed Falls prevention discussed;Education provided;Falls evaluation completed Falls prevention discussed Falls prevention discussed Follow up appointment;Falls prevention discussed;Education provided;Falls evaluation completed    MEDICARE RISK AT HOME:  Medicare Risk at Home Any stairs in or around the home?: Yes If so, are there any without handrails?: No Home free of loose throw rugs in walkways, pet beds, electrical cords, etc?: Yes Adequate lighting in your home to reduce risk of falls?: Yes Life alert?: No Use of a cane, walker or w/c?: Yes (CANE IF ON FEET TOO LONG) Grab bars in the bathroom?: No Shower chair or bench in shower?: No  TIMED UP AND GO:  Was the test performed?  No  Cognitive Function: 6CIT completed        12/18/2023    2:42 PM 11/22/2022    2:12 PM  6CIT Screen  What Year? 0 points 0 points  What month? 0 points 0 points  What time? 0 points 0 points  Count back from 20 0 points 0 points  Months in reverse 0 points 0 points  Repeat phrase 0 points 0 points  Total Score 0 points 0 points    Immunizations Immunization History  Administered Date(s) Administered   Influenza,inj,Quad PF,6+ Mos 07/07/2014, 07/09/2019, 10/26/2020, 06/13/2021, 10/15/2022    Influenza-Unspecified 07/07/2014   Pneumococcal Polysaccharide-23 07/07/2014   Tdap 05/04/2014, 05/19/2021   Zoster Recombinant(Shingrix) 10/26/2020, 10/15/2022    Screening Tests Health Maintenance  Topic Date Due   Pneumococcal Vaccine 9-41 Years old (2 of 2 - PCV) 09/03/2024 (Originally 07/08/2015)   INFLUENZA VACCINE  04/16/2024   MAMMOGRAM  12/16/2024   Medicare Annual Wellness (AWV)  12/17/2024   Colonoscopy  04/20/2029   DTaP/Tdap/Td (3 - Td or Tdap) 05/20/2031   Hepatitis C Screening  Completed   HIV Screening  Completed   Zoster Vaccines- Shingrix  Completed   HPV VACCINES  Aged Out   Lung Cancer Screening  Discontinued   COVID-19 Vaccine  Discontinued    Health Maintenance  There are no preventive care reminders to display for this patient. Health Maintenance Items Addressed: UP TO DATE ON SHOTS, MAMMOGRAM  Additional Screening:  Vision Screening: Recommended annual ophthalmology exams for early detection of glaucoma and other disorders of the eye.  Dental Screening: Recommended annual dental exams  for proper oral hygiene  Community Resource Referral / Chronic Care Management: CRR required this visit?  No   CCM required this visit?  No     Plan:     I have personally reviewed and noted the following in the patient's chart:   Medical and social history Use of alcohol, tobacco or illicit drugs  Current medications and supplements including opioid prescriptions. Patient is not currently taking opioid prescriptions. Functional ability and status Nutritional status Physical activity Advanced directives List of other physicians Hospitalizations, surgeries, and ER visits in previous 12 months Vitals Screenings to include cognitive, depression, and falls Referrals and appointments  In addition, I have reviewed and discussed with patient certain preventive protocols, quality metrics, and best practice recommendations. A written personalized care plan for  preventive services as well as general preventive health recommendations were provided to patient.     Hal Hope, LPN   09/21/1094   After Visit Summary: (MyChart) Due to this being a telephonic visit, the after visit summary with patients personalized plan was offered to patient via MyChart   Notes: Nothing significant to report at this time.

## 2023-12-19 ENCOUNTER — Other Ambulatory Visit (HOSPITAL_COMMUNITY): Payer: Self-pay

## 2023-12-19 ENCOUNTER — Encounter: Payer: Self-pay | Admitting: Oncology

## 2023-12-19 ENCOUNTER — Other Ambulatory Visit: Payer: Self-pay

## 2023-12-20 ENCOUNTER — Other Ambulatory Visit (HOSPITAL_COMMUNITY): Payer: Self-pay

## 2023-12-23 ENCOUNTER — Other Ambulatory Visit

## 2023-12-23 ENCOUNTER — Ambulatory Visit: Admitting: Pharmacist

## 2023-12-23 ENCOUNTER — Ambulatory Visit: Admitting: Oncology

## 2023-12-26 ENCOUNTER — Encounter: Payer: Self-pay | Admitting: Oncology

## 2023-12-31 ENCOUNTER — Ambulatory Visit: Payer: Self-pay

## 2023-12-31 ENCOUNTER — Encounter: Payer: Self-pay | Admitting: Radiation Oncology

## 2023-12-31 ENCOUNTER — Ambulatory Visit
Admission: RE | Admit: 2023-12-31 | Discharge: 2023-12-31 | Disposition: A | Discharge status: Home / Self Care | Payer: Medicare PPO | Source: Ambulatory Visit | Attending: Radiation Oncology | Admitting: Radiation Oncology

## 2023-12-31 VITALS — BP 107/72 | HR 92 | Temp 96.7°F | Resp 16 | Ht 67.0 in | Wt 180.9 lb

## 2023-12-31 DIAGNOSIS — C9110 Chronic lymphocytic leukemia of B-cell type not having achieved remission: Secondary | ICD-10-CM | POA: Diagnosis not present

## 2023-12-31 NOTE — Telephone Encounter (Signed)
 1st attempt-LVM   Copied from CRM [id number]. Topic: Clinical - Medical Advice >> Dec 31, 2023 12:28 PM Tiffany B wrote: Reason for CRM: Patient experiencing a cough, runny nose and states she has cancer. Caller was seeking an appointment and there's no available appointments.

## 2023-12-31 NOTE — Progress Notes (Signed)
 Radiation Oncology Follow up Note  Name: Monique [name]   Date:   12/31/2023 MRN:  [id number] DOB: [birth year]    This 53 y.o. 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  REFERRING PROVIDER: Sowles, Krichna, MD  HPI: Patient is a 53 year old female treated both to her abdominal.  Field as well as her left axilla.  For involvement of CLL.  She is seen today in routine follow-up and is doing fairly well.  She specifically denies any abdominal complaints diarrhea.  She does continue to have night sweats.  She is currently on Revlimid which she is tolerating well.  She has had problems with thrombocytopenia and neutropenia and chronic anemia.  She is having no fever or chills.  COMPLICATIONS OF TREATMENT: none  FOLLOW UP COMPLIANCE: keeps appointments   PHYSICAL EXAM:  BP 107/72   Pulse 92   Temp (!) 96.7 F (35.9 C) (Tympanic)   Resp 16   Ht 5\' 7"  (1.702 m)   Wt 180 lb 14.4 oz (82.1 kg)   LMP 08/17/2016   BMI 28.33 kg/m  There is some slight fullness in the left axilla.  No other evidence of peripheral adenopathy is identified.  Well-developed well-nourished patient in NAD. HEENT reveals PERLA, EOMI, discs not visualized.  Oral cavity is clear. No oral mucosal lesions are identified. Neck is clear without evidence of cervical or supraclavicular adenopathy. Lungs are clear to A&P. Cardiac examination is essentially unremarkable with regular rate and rhythm without murmur rub or thrill. Abdomen is benign with no organomegaly or masses noted. Motor sensory and DTR levels are equal and symmetric in the upper and lower extremities. Cranial nerves II through XII are grossly intact. Proprioception is intact. No peripheral adenopathy or edema is identified. No motor or sensory levels are noted. Crude visual fields are within normal  range.  RADIOLOGY RESULTS: No current films for review  PLAN: Present time I am going to turn follow-up care over to medical oncology.  She is under treatment actively in their department.  I have asked her to copy my any imaging study she has in the future.  I be happy to reevaluate her anytime should further radiation therapy be indicated.  Patient knows to call with any concerns.  I would like to take this opportunity to thank you for allowing me to participate in the care of your patient.Monique Langdon, MD

## 2023-12-31 NOTE — Telephone Encounter (Signed)
 Reason for Disposition  [1] Continuous (nonstop) coughing interferes with work or school AND [2] no improvement using cough treatment per Care Advice  Answer Assessment - Initial Assessment Questions 1. ONSET: "When did the cough begin?"      Runny nose and coughing for a month now.   I thought I would get better but I' not.    2. SEVERITY: "How bad is the cough today?"      Bad and getting worse No sore throat or fever. 3. SPUTUM: "Describe the color of your sputum" (none, dry cough; clear, white, yellow, green)     Clear 4. HEMOPTYSIS: "Are you coughing up any blood?" If so ask: "How much?" (flecks, streaks, tablespoons, etc.)     Not asked 5. DIFFICULTY BREATHING: "Are you having difficulty breathing?" If Yes, ask: "How bad is it?" (e.g., mild, moderate, severe)    - MILD: No SOB at rest, mild SOB with walking, speaks normally in sentences, can lie down, no retractions, pulse < 100.    - MODERATE: SOB at rest, SOB with minimal exertion and prefers to sit, cannot lie down flat, speaks in phrases, mild retractions, audible wheezing, pulse 100-120.    - SEVERE: Very SOB at rest, speaks in single words, struggling to breathe, sitting hunched forward, retractions, pulse > 120      Chest tightness.  I have a really deep cough.   6. FEVER: "Do you have a fever?" If Yes, ask: "What is your temperature, how was it measured, and when did it start?"     No 7. CARDIAC HISTORY: "Do you have any history of heart disease?" (e.g., heart attack, congestive heart failure)      Not asked 8. LUNG HISTORY: "Do you have any history of lung disease?"  (e.g., pulmonary embolus, asthma, emphysema)     I have COPD     She usually gives me a rx cough medicine. 9. PE RISK FACTORS: "Do you have a history of blood clots?" (or: recent major surgery, recent prolonged travel, bedridden)     Not asked 10. OTHER SYMPTOMS: "Do you have any other symptoms?" (e.g., runny nose, wheezing, chest pain)       No wheezing or  shortness of breath, no fever. 11. PREGNANCY: "Is there any chance you are pregnant?" "When was your last menstrual period?"       N/A 12. TRAVEL: "Have you traveled out of the country in the last month?" (e.g., travel history, exposures)       N/A  Protocols used: Cough - Acute Productive-A-AH  Chief Complaint: productive cough and runny nose   Has COPD Symptoms: Coughing up clear mucus and has a runny nose Frequency: For a month now and getting worse Pertinent Negatives: Patient denies fever, shortness of breath or wheezing. Disposition: [] ED /[] Urgent Care (no appt availability in office) / [x] Appointment(In office/virtual)/ []  [facility]/ [] Home Care/ [] Refused Recommended Disposition /[] [facility]/ []  Follow-up with PCP Additional Notes: Appt made for Friday 01/02/2024.

## 2024-01-02 ENCOUNTER — Ambulatory Visit: Admitting: Family Medicine

## 2024-01-02 ENCOUNTER — Ambulatory Visit
Admission: RE | Admit: 2024-01-02 | Discharge: 2024-01-02 | Disposition: A | Discharge status: Home / Self Care | Source: Ambulatory Visit | Attending: Family Medicine | Admitting: Family Medicine

## 2024-01-02 ENCOUNTER — Ambulatory Visit
Admission: RE | Admit: 2024-01-02 | Discharge: 2024-01-02 | Disposition: A | Discharge status: Home / Self Care | Attending: Family Medicine | Admitting: Family Medicine

## 2024-01-02 ENCOUNTER — Encounter: Payer: Self-pay | Admitting: Family Medicine

## 2024-01-02 ENCOUNTER — Encounter: Payer: Self-pay | Admitting: Oncology

## 2024-01-02 VITALS — BP 126/72 | HR 90 | Temp 98.0°F | Resp 16 | Ht 67.0 in | Wt 182.0 lb

## 2024-01-02 DIAGNOSIS — C9110 Chronic lymphocytic leukemia of B-cell type not having achieved remission: Secondary | ICD-10-CM | POA: Diagnosis not present

## 2024-01-02 DIAGNOSIS — J432 Centrilobular emphysema: Secondary | ICD-10-CM | POA: Diagnosis not present

## 2024-01-02 DIAGNOSIS — D849 Immunodeficiency, unspecified: Secondary | ICD-10-CM | POA: Diagnosis not present

## 2024-01-02 DIAGNOSIS — R052 Subacute cough: Secondary | ICD-10-CM | POA: Diagnosis not present

## 2024-01-02 DIAGNOSIS — J069 Acute upper respiratory infection, unspecified: Secondary | ICD-10-CM | POA: Diagnosis not present

## 2024-01-02 DIAGNOSIS — R051 Acute cough: Secondary | ICD-10-CM

## 2024-01-02 DIAGNOSIS — R0602 Shortness of breath: Secondary | ICD-10-CM | POA: Insufficient documentation

## 2024-01-02 DIAGNOSIS — D61818 Other pancytopenia: Secondary | ICD-10-CM | POA: Diagnosis not present

## 2024-01-02 DIAGNOSIS — I700 Atherosclerosis of aorta: Secondary | ICD-10-CM | POA: Diagnosis not present

## 2024-01-02 DIAGNOSIS — J310 Chronic rhinitis: Secondary | ICD-10-CM

## 2024-01-02 DIAGNOSIS — R590 Localized enlarged lymph nodes: Secondary | ICD-10-CM | POA: Diagnosis not present

## 2024-01-02 MED ORDER — PREDNISONE 20 MG PO TABS: ORAL_TABLET | ORAL | 0 refills | Status: DC

## 2024-01-02 MED ORDER — HYDROCOD POLI-CHLORPHE POLI ER 10-8 MG/5ML PO SUER: 5.0000 mL | Freq: Every evening | ORAL | 0 refills | Status: DC | PRN

## 2024-01-02 MED ORDER — AZITHROMYCIN 250 MG PO TABS
ORAL_TABLET | ORAL | 0 refills | Status: AC
Start: 2024-01-02 — End: 2024-01-07

## 2024-01-02 MED ORDER — LEVOCETIRIZINE DIHYDROCHLORIDE 5 MG PO TABS: 5.0000 mg | ORAL_TABLET | Freq: Every evening | ORAL | 1 refills | Status: DC

## 2024-01-02 MED ORDER — ALBUTEROL SULFATE HFA 108 (90 BASE) MCG/ACT IN AERS: 2.0000 | INHALATION_SPRAY | Freq: Four times a day (QID) | RESPIRATORY_TRACT | 0 refills | Status: AC | PRN

## 2024-01-02 NOTE — Progress Notes (Signed)
 Patient ID: Monique [name], female    DOB: 03-Mar-1971, 53 y.o.   MRN: [id number]  PCP: Arleen Lacer, MD  Chief Complaint  Patient presents with   Cough    Productive, x1 month   Nasal Congestion    Subjective:   Monique [name] is a 53 y.o. female, presents to clinic with CC of the following:  HPI  Patient is here for URI symptoms cough shortness of breath that was a month ago with history of CLL treatment, recent neutropenia and radiation  She also states something in her left lower chest/left upper abd hurt a few days ago, pain was sudden and severe - "something turned in there", it lasted for a few hours and then resolved Taking loratidine for allergies Worse sweats, chills, cough, runny nose, cough and possibly worse fatigue and SOB Some rattling in chest, cough productive She has hx of lung disease on maintenance inhaler and has albuterol  but she hasn't tried it She denies pleuritic chest pain, fever  With her CLL tx she did recently complete RT and with her oral chemo was neutropenic early march but March 25 labs show ANC improved    Allergy  to doxy Not on abx and none in the last month On anoro, not using albuterol     Patient Active Problem List   Diagnosis Date Noted   Stage 3a chronic kidney disease (HCC) 10/02/2023   Senile purpura (HCC) 10/02/2023   Major depression in remission (HCC) 10/02/2023   Pancytopenia (HCC) 10/02/2023   Post-menopause on HRT (hormone replacement therapy) 11/08/2022   At risk for sleep apnea 09/19/2021   Lumbar foraminal stenosis (L4-5) (Bilateral) (R>L) 06/05/2021   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 06/05/2021   Lumbosacral radiculopathy/radiculitis at L5 (Bilateral) 06/05/2021   PAD (peripheral artery disease) (HCC) 02/14/2021   CIN II (cervical intraepithelial neoplasia II) 02/01/2021   S/P vaginal hysterectomy 01/22/2021   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/09/2020   Bilateral leg  pain 11/03/2019   Iron  deficiency anemia 10/15/2019   Bilateral arm pain 08/05/2019   Bilateral hand numbness 08/05/2019   Weakness of both hands 08/05/2019   Chronic musculoskeletal pain 07/19/2019   Chronic neuropathic pain 07/19/2019   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   Special screening for malignant neoplasms, colon    Polyp of sigmoid colon    Hemorrhoids    Trigeminal neuralgia 10/05/2018   Lumbar radiculitis (Right) 09/24/2018   Hypocalcemia 06/03/2018   Vitamin D  insufficiency 06/03/2018   Abnormal MRI, lumbar spine (06/02/2020) 06/03/2018   Chronic hip pain (Right) 06/03/2018   Spondylosis without myelopathy or radiculopathy, lumbar region 06/03/2018   DDD (degenerative disc disease), lumbar 06/02/2018   Lumbar facet hypertrophy 06/02/2018   Lumbar facet arthropathy 06/02/2018   Lumbar facet syndrome (Bilateral) (R>L) 06/02/2018   Marijuana use 05/19/2018   Chronic lower extremity pain (1ry area of Pain) (Bilateral) (R>L) 05/13/2018   Chronic low back pain (2ry area of Pain) (Bilateral) (R>L) w/ sciatica (Bilateral) 05/13/2018   Chronic pain syndrome 05/13/2018   Disorder of skeletal system 05/13/2018   Pharmacologic therapy 05/13/2018   Problems influencing health status 05/13/2018   Mastalgia 05/05/2018   Breast mass, right 01/07/2018   Face pain 12/11/2017   Tingling 12/11/2017   Thoracic aortic atherosclerosis (HCC) 08/20/2017   Emphysema of lung (HCC) 08/20/2017   Adductor tendinitis 04/23/2017   Trochanteric bursitis of right hip 04/23/2017   GERD without esophagitis 12/11/2016   CLL (chronic lymphocytic leukemia) (HCC) 11/24/2016  Stress incontinence 09/04/2016   B12 deficiency 07/10/2015   Insomnia 07/10/2015   Anorexia nervosa, restricting type 07/10/2015   Anxiety, generalized 07/10/2015   H/O suicide attempt 07/10/2015   Lymphocytosis 07/10/2015   Obsessive-compulsive disorder 07/10/2015   Tobacco use 07/10/2015   History of cervical dysplasia  07/18/2014      Current Outpatient Medications:    albuterol  (VENTOLIN  HFA) 108 (90 Base) MCG/ACT inhaler, Inhale 2 puffs into the lungs every 6 (six) hours as needed for wheezing or shortness of breath., Disp: 8 g, Rfl: 0   ASPIRIN  81 PO, Take 81 mg by mouth daily., Disp: , Rfl:    atorvastatin  (LIPITOR) 10 MG tablet, Take 1 tablet (10 mg total) by mouth in the morning., Disp: 90 tablet, Rfl: 1   Ca Phosphate-Cholecalciferol (CALTRATE GUMMY BITES) 250-10 MG-MCG CHEW, Chew 1 gummy by mouth 2 (two) times daily., Disp: 180 tablet, Rfl: 3   diclofenac  (FLECTOR ) 1.3 % PTCH, Place 1 patch onto the skin 2 (two) times daily., Disp: 60 patch, Rfl: 0   estrogens , conjugated, (PREMARIN ) 0.625 MG tablet, Take 1 tablet (0.625 mg total) by mouth daily for 21 days, then do not take for 7 days., Disp: 90 tablet, Rfl: 0   famotidine  (PEPCID ) 10 MG tablet, Take by mouth., Disp: , Rfl:    lenalidomide  (REVLIMID ) 10 MG capsule, TAKE 1 CAPSULE BY MOUTH EVERY DAY, Disp: 28 capsule, Rfl: 0   loratadine  (CLARITIN ) 10 MG tablet, Take 1 tablet (10 mg total) by mouth daily in the morning., Disp: 90 tablet, Rfl: 1   omeprazole  (PRILOSEC) 20 MG capsule, Take 1 capsule (20 mg total) by mouth in the morning., Disp: 30 capsule, Rfl: 4   pregabalin  (LYRICA ) 150 MG capsule, Take 1 capsule (150 mg total) by mouth 3 (three) times daily., Disp: 90 capsule, Rfl: 1   tiZANidine  (ZANAFLEX ) 2 MG tablet, Take 1 tablet (2 mg total) by mouth at bedtime., Disp: 90 tablet, Rfl: 1   umeclidinium-vilanterol (ANORO ELLIPTA ) 62.5-25 MCG/ACT AEPB, Inhale 1 puff into the lungs daily., Disp: 180 each, Rfl: 1   vitamin B-12 (CYANOCOBALAMIN ) 500 MCG tablet, Take 250 mcg by mouth every morning., Disp: , Rfl:    ARIPiprazole  (ABILIFY ) 5 MG tablet, Take 1 tablet (5 mg total) by mouth daily. (Patient not taking: Reported on 01/02/2024), Disp: 90 tablet, Rfl: 1   Allergies  Allergen Reactions   Doxycycline  Rash    Rash on cheeks and sides of nose     Duloxetine  Rash     Social History   Tobacco Use   Smoking status: Every Day    Types: E-cigarettes   Smokeless tobacco: Never  Vaping Use   Vaping status: Former  Substance Use Topics   Alcohol use: Not Currently    Alcohol/week: 0.0 standard drinks of alcohol    Comment: rarely   Drug use: Yes    Frequency: 7.0 times per week    Types: Marijuana      Chart Review Today: I personally reviewed active problem list, medication list, allergies, family history, social history, health maintenance, notes from last encounter, lab results, imaging with the patient/caregiver today.  ROS negative other than indicated in HPI Review of Systems  Constitutional: Negative.   HENT: Negative.    Eyes: Negative.   Respiratory: Negative.    Cardiovascular: Negative.   Gastrointestinal: Negative.   Endocrine: Negative.   Genitourinary: Negative.   Musculoskeletal: Negative.   Skin: Negative.   Allergic/Immunologic: Negative.   Neurological: Negative.  Hematological: Negative.   Psychiatric/Behavioral: Negative.    All other systems reviewed and are negative.      Objective:   Vitals:   01/02/24 1102  BP: 126/72  Pulse: 90  Resp: 16  Temp: 98 F (36.7 C)  SpO2: 96%  Weight: 182 lb (82.6 kg)  Height: 5\' 7"  (1.702 m)    Body mass index is 28.51 kg/m.  Physical Exam Vitals and nursing note reviewed.  Constitutional:      General: She is not in acute distress.    Appearance: She is well-developed. She is not toxic-appearing or diaphoretic.  HENT:     Head: Normocephalic and atraumatic.     Nose: Mucosal edema and rhinorrhea present. Rhinorrhea is clear.     Right Sinus: No maxillary sinus tenderness or frontal sinus tenderness.     Left Sinus: No maxillary sinus tenderness or frontal sinus tenderness.     Mouth/Throat:     Mouth: Mucous membranes are moist.     Pharynx: Oropharynx is clear. Posterior oropharyngeal erythema present. No oropharyngeal exudate.  Eyes:      General: No scleral icterus.       Right eye: No discharge.        Left eye: No discharge.     Conjunctiva/sclera: Conjunctivae normal.  Neck:     Trachea: No tracheal deviation.  Cardiovascular:     Rate and Rhythm: Normal rate and regular rhythm.     Pulses: Normal pulses.     Heart sounds: Normal heart sounds.  Pulmonary:     Effort: Pulmonary effort is normal. No tachypnea, accessory muscle usage, respiratory distress or retractions.     Breath sounds: No stridor or transmitted upper airway sounds. No decreased breath sounds or wheezing.     Comments: Some coarse breath sounds at the right lower lung fields Skin:    General: Skin is warm and dry.     Coloration: Skin is not pale.     Findings: No rash.  Neurological:     Mental Status: She is alert.     Motor: No abnormal muscle tone.     Coordination: Coordination normal.  Psychiatric:        Behavior: Behavior normal.      Results for orders placed or performed in visit on 12/16/23  CMP (Cancer Center only)   Collection Time: 12/16/23  8:51 AM  Result Value Ref Range   Sodium 138 135 - 145 mmol/L   Potassium 3.9 3.5 - 5.1 mmol/L   Chloride 108 98 - 111 mmol/L   CO2 23 22 - 32 mmol/L   Glucose, Bld 96 70 - 99 mg/dL   BUN 12 6 - 20 mg/dL   Creatinine 9.62 (H) 9.52 - 1.00 mg/dL   Calcium  8.6 (L) 8.9 - 10.3 mg/dL   Total Protein 6.1 (L) 6.5 - 8.1 g/dL   Albumin 3.8 3.5 - 5.0 g/dL   AST 17 15 - 41 U/L   ALT 14 0 - 44 U/L   Alkaline Phosphatase 93 38 - 126 U/L   Total Bilirubin 0.7 0.0 - 1.2 mg/dL   GFR, Estimated 55 (L) >60 mL/min   Anion gap 7 5 - 15  CBC with Differential/Platelet   Collection Time: 12/16/23  8:51 AM  Result Value Ref Range   WBC 4.5 4.0 - 10.5 K/uL   RBC 2.99 (L) 3.87 - 5.11 MIL/uL   Hemoglobin 10.7 (L) 12.0 - 15.0 g/dL   HCT 84.1 (L) 32.4 - 40.1 %  MCV 105.7 (H) 80.0 - 100.0 fL   MCH 35.8 (H) 26.0 - 34.0 pg   MCHC 33.9 30.0 - 36.0 g/dL   RDW 95.6 (H) 21.3 - 08.6 %   Platelets 74  (L) 150 - 400 K/uL   nRBC 0.0 0.0 - 0.2 %   Neutrophils Relative % 57 %   Neutro Abs 2.6 1.7 - 7.7 K/uL   Lymphocytes Relative 33 %   Lymphs Abs 1.5 0.7 - 4.0 K/uL   Monocytes Relative 8 %   Monocytes Absolute 0.3 0.1 - 1.0 K/uL   Eosinophils Relative 1 %   Eosinophils Absolute 0.1 0.0 - 0.5 K/uL   Basophils Relative 0 %   Basophils Absolute 0.0 0.0 - 0.1 K/uL   Immature Granulocytes 1 %   Abs Immature Granulocytes 0.04 0.00 - 0.07 K/uL   *Note: Due to a large number of results and/or encounters for the requested time period, some results have not been displayed. A complete set of results can be found in Results Review.       Assessment & Plan:   Pt is 53 y/o female with hx of CLL on active tx and recently finished RT with neutropenia she presents with a month of nasal sinus symptoms and coughing and it is unclear about the onset or worsening of exertional dyspnea fatigue sweats and chills.  Again the history was somewhat difficult today but it does sound like she is having some exertional fatigue or shortness of breath that is not quite her normal.  She does have a history of lung disease is on maintenance inhalers, has not used her rescue inhaler, symptoms have been ongoing for at least a month.  She denies any known fever recently, reviewed her recent labs treatment and oncology and radiation oncology notes in the chart.  On exam she did have slight congestion the right lower lobes she endorsed pain to her left lower chest and left upper quadrant that was acute several days ago and has since resolved.  Vital signs were stable today, no rales on exam, but because of some that congestion and the concerning history a chest x-ray was ordered stat across the street.  She did ask for cough medicines which I prescribed for her and did verify that she has had them before.   Discussed the possibility of treating lung disease and prolonged respiratory infection with steroids, cough medicines, Mucinex,  optimizing allergy  management, and seeing if she needs antibiotics.  Ambulatory pulse ox was done in office prior to her leaving today and she had no desaturation or significant tachycardia, also no concerning work of breathing with ambulation.  She is no longer neutropenic she is not currently febrile so I do feel she is appropriate for outpatient treatment without referring her at this time to the hospital.  I did message her oncologist inquiring about any preferred antibiotic coverage and to see if there is any concern with her doing a little bit of steroids for her symptoms but I did not get a response today  After reviewing chest x-ray I did send in prednisone  and a Z-Pak with a lack of pneumonia on the chest x-ray but prolonged upper respiratory symptoms and history of lung disease and immunocompromise, patient likely to have more benefit than harm from zpak and steroids.  Called pt to give her CXR results and notify her of updated plan at 4:21 PM reviewed the plan, imaging, med prescriptions with the patient, she agrees to current plan, all questions  asked and answered Recommended follow-up if not improving in the next 5 to 7 days    ICD-10-CM   1. CLL (chronic lymphocytic leukemia) (HCC)  C91.10 DG Chest 2 View    2. Pancytopenia (HCC)  D61.818 DG Chest 2 View    3. Upper respiratory tract infection, unspecified type  J06.9 DG Chest 2 View    4. Acute cough  R05.1 DG Chest 2 View    chlorpheniramine-HYDROcodone  (TUSSIONEX) 10-8 MG/5ML    predniSONE  (DELTASONE ) 20 MG tablet    azithromycin  (ZITHROMAX ) 250 MG tablet    5. Shortness of breath  R06.02 DG Chest 2 View    predniSONE  (DELTASONE ) 20 MG tablet    azithromycin  (ZITHROMAX ) 250 MG tablet    6. Immunocompromised state (HCC)  D84.9 DG Chest 2 View    azithromycin  (ZITHROMAX ) 250 MG tablet    7. Centrilobular emphysema (HCC)  J43.2 predniSONE  (DELTASONE ) 20 MG tablet    8. Rhinitis, unspecified type  J31.0 levocetirizine  (XYZAL ) 5 MG tablet       Adeline Hone, PA-C 01/02/24 11:14 AM

## 2024-01-05 ENCOUNTER — Other Ambulatory Visit: Payer: Self-pay

## 2024-01-06 ENCOUNTER — Other Ambulatory Visit: Payer: Self-pay

## 2024-01-06 ENCOUNTER — Encounter: Payer: Self-pay | Admitting: Oncology

## 2024-01-06 ENCOUNTER — Telehealth: Payer: Self-pay

## 2024-01-06 ENCOUNTER — Other Ambulatory Visit (HOSPITAL_COMMUNITY): Payer: Self-pay

## 2024-01-06 NOTE — Telephone Encounter (Signed)
 Req too soon, was given a 6 month supply on 09/2023

## 2024-01-06 NOTE — Telephone Encounter (Signed)
Refill request for atorvastatin (LIPITOR) 10 MG tablet

## 2024-01-07 ENCOUNTER — Other Ambulatory Visit: Payer: Self-pay | Admitting: Pharmacist

## 2024-01-07 DIAGNOSIS — C9110 Chronic lymphocytic leukemia of B-cell type not having achieved remission: Secondary | ICD-10-CM

## 2024-01-07 MED ORDER — LENALIDOMIDE 10 MG PO CAPS
10.0000 mg | ORAL_CAPSULE | Freq: Every day | ORAL | 0 refills | Status: DC
Start: 2024-01-07 — End: 2024-02-10

## 2024-01-08 ENCOUNTER — Encounter: Payer: Self-pay | Admitting: Oncology

## 2024-01-12 ENCOUNTER — Other Ambulatory Visit: Payer: Self-pay | Admitting: *Deleted

## 2024-01-12 DIAGNOSIS — C9110 Chronic lymphocytic leukemia of B-cell type not having achieved remission: Secondary | ICD-10-CM

## 2024-01-13 ENCOUNTER — Inpatient Hospital Stay (HOSPITAL_BASED_OUTPATIENT_CLINIC_OR_DEPARTMENT_OTHER): Admitting: Oncology

## 2024-01-13 ENCOUNTER — Inpatient Hospital Stay

## 2024-01-13 ENCOUNTER — Encounter: Payer: Self-pay | Admitting: Oncology

## 2024-01-13 ENCOUNTER — Ambulatory Visit: Admitting: Oncology

## 2024-01-13 ENCOUNTER — Other Ambulatory Visit (HOSPITAL_COMMUNITY): Payer: Self-pay

## 2024-01-13 ENCOUNTER — Other Ambulatory Visit: Payer: Self-pay

## 2024-01-13 ENCOUNTER — Other Ambulatory Visit

## 2024-01-13 ENCOUNTER — Ambulatory Visit: Admitting: Pharmacist

## 2024-01-13 ENCOUNTER — Inpatient Hospital Stay: Admitting: Pharmacist

## 2024-01-13 VITALS — BP 100/64 | HR 74 | Temp 98.6°F | Resp 20 | Wt 182.4 lb

## 2024-01-13 DIAGNOSIS — C9110 Chronic lymphocytic leukemia of B-cell type not having achieved remission: Secondary | ICD-10-CM

## 2024-01-13 LAB — CMP (CANCER CENTER ONLY)
ALT: 24 U/L (ref 0–44)
AST: 23 U/L (ref 15–41)
Albumin: 4 g/dL (ref 3.5–5.0)
Alkaline Phosphatase: 90 U/L (ref 38–126)
Anion gap: 9 (ref 5–15)
BUN: 16 mg/dL (ref 6–20)
CO2: 25 mmol/L (ref 22–32)
Calcium: 9.4 mg/dL (ref 8.9–10.3)
Chloride: 101 mmol/L (ref 98–111)
Creatinine: 1.39 mg/dL — ABNORMAL HIGH (ref 0.44–1.00)
GFR, Estimated: 45 mL/min — ABNORMAL LOW (ref >60)
Glucose, Bld: 76 mg/dL (ref 70–99)
Potassium: 4.1 mmol/L (ref 3.5–5.1)
Sodium: 135 mmol/L (ref 135–145)
Total Bilirubin: 1.5 mg/dL — ABNORMAL HIGH (ref 0.0–1.2)
Total Protein: 6.1 g/dL — ABNORMAL LOW (ref 6.5–8.1)

## 2024-01-13 LAB — CBC WITH DIFFERENTIAL/PLATELET
Abs Immature Granulocytes: 0.03 10*3/uL (ref 0.00–0.07)
Basophils Absolute: 0 10*3/uL (ref 0.0–0.1)
Basophils Relative: 1 %
Eosinophils Absolute: 0.1 10*3/uL (ref 0.0–0.5)
Eosinophils Relative: 3 %
HCT: 36.2 % (ref 36.0–46.0)
Hemoglobin: 12.1 g/dL (ref 12.0–15.0)
Immature Granulocytes: 1 %
Lymphocytes Relative: 54 %
Lymphs Abs: 1.5 10*3/uL (ref 0.7–4.0)
MCH: 36.1 pg — ABNORMAL HIGH (ref 26.0–34.0)
MCHC: 33.4 g/dL (ref 30.0–36.0)
MCV: 108.1 fL — ABNORMAL HIGH (ref 80.0–100.0)
Monocytes Absolute: 0.2 10*3/uL (ref 0.1–1.0)
Monocytes Relative: 7 %
Neutro Abs: 0.9 10*3/uL — ABNORMAL LOW (ref 1.7–7.7)
Neutrophils Relative %: 34 %
Platelets: 68 10*3/uL — ABNORMAL LOW (ref 150–400)
RBC: 3.35 MIL/uL — ABNORMAL LOW (ref 3.87–5.11)
RDW: 13.8 % (ref 11.5–15.5)
Smear Review: NORMAL
WBC: 2.7 10*3/uL — ABNORMAL LOW (ref 4.0–10.5)
nRBC: 0 % (ref 0.0–0.2)

## 2024-01-13 NOTE — Progress Notes (Signed)
 [facility]  [phone] [phone]  ID: Nena Bank OB: Nov 09, 1970  MR#: [id number]  AOZ#:[id number]  Patient Care Team: Sowles, Krichna, MD as PCP - General (Family Medicine) Shellie Dials, MD as Consulting Physician (Oncology) Devon Fogo, [facility] (Inactive) as Pharmacist (Pharmacist) Queenie Brunet, RN as Registered Nurse (Oncology) Borders, Carlene Che, NP as Nurse Practitioner (Hospice and Palliative Medicine) Glenis Langdon, MD as Consulting Physician (Radiation Oncology) Pa, [facility]  CHIEF COMPLAINT: CLL.  INTERVAL HISTORY: Patient returns to clinic today for further evaluation and continuation of Revlimid .  She currently feels well and is asymptomatic.  She no longer has congestion or cough.  She denies any fevers.  She continues to have a chronic peripheral neuropathy. She has no other neurologic complaints.  She has a good appetite.  She has no chest pain, shortness of breath, or hemoptysis.  She denies any nausea, vomiting, constipation, or diarrhea.  She has no melena or hematochezia.  She has no urinary complaints.  Patient offers no further specific complaints today.  REVIEW OF SYSTEMS:   Review of Systems  Constitutional: Negative.  Negative for diaphoresis, fever, malaise/fatigue and weight loss.  HENT:  Negative for congestion.   Respiratory: Negative.  Negative for cough and shortness of breath.   Cardiovascular: Negative.  Negative for chest pain and leg swelling.  Gastrointestinal: Negative.  Negative for abdominal pain, constipation and diarrhea.  Genitourinary: Negative.  Negative for dysuria and frequency.  Musculoskeletal: Negative.  Negative for back pain, myalgias and neck pain.  Skin: Negative.  Negative for rash.  Neurological:  Positive for tingling and sensory change. Negative for focal weakness and weakness.  Psychiatric/Behavioral: Negative.  Negative for depression and suicidal ideas.  The patient is not nervous/anxious.     As per HPI. Otherwise, a complete review of systems is negative.  PAST MEDICAL HISTORY: Past Medical History:  Diagnosis Date   Anxiety    Bursitis    leg pain   Carpal tunnel syndrome    Cervical dysplasia    hx LEEP over 18 years ago.    Chronic lymphocytic leukemia (HCC) 2018   Dr Adrian Alba.  (in lymph nodes)   COPD (chronic obstructive pulmonary disease) (HCC)    COVID-19 2021   Depression    Eating disorder    GERD (gastroesophageal reflux disease)    History of self-harm    Insomnia    Obsession    Tobacco abuse    Vitamin B12 deficiency (non anemic)     PAST SURGICAL HISTORY: Past Surgical History:  Procedure Laterality Date   BREAST BIOPSY Right 01/05/2018   US  guided biopsy of 2 areas and 1 lymph node, MIXED INFLAMMATION AND GIANT CELL REACTION   CERVICAL BIOPSY  W/ LOOP ELECTRODE EXCISION     COLONOSCOPY WITH PROPOFOL  N/A 04/21/2019   Procedure: COLONOSCOPY WITH PROPOFOL ;  Surgeon: Irby Mannan, MD;  Location: [facility] ENDOSCOPY;  Service: Gastroenterology;  Laterality: N/A;   OTHER SURGICAL HISTORY     scar tissue removed from vocal cords   TUBAL LIGATION     VAGINAL HYSTERECTOMY N/A 01/22/2021   Procedure: HYSTERECTOMY VAGINAL; BILATERAL SALPINGECTOMY;  Surgeon: Teresa Fender, MD;  Location: [facility] ORS;  Service: Gynecology;  Laterality: N/A;   vocal cord surgery  2005    FAMILY HISTORY: Family History  Problem Relation Age of Onset   Depression Mother    Cancer Mother        thyroid   Alcohol abuse  Father    COPD Father    Alcohol abuse Brother    Depression Brother    Bipolar disorder Brother    Suicidality Brother    ADD / ADHD Son    Breast cancer Neg Hx     ADVANCED DIRECTIVES (Y/N):  N  HEALTH MAINTENANCE: Social History   Tobacco Use   Smoking status: Every Day    Types: E-cigarettes   Smokeless tobacco: Never  Vaping Use   Vaping status: Former  Substance Use Topics   Alcohol use: Not  Currently    Alcohol/week: 0.0 standard drinks of alcohol    Comment: rarely   Drug use: Yes    Frequency: 7.0 times per week    Types: Marijuana     Colonoscopy:  PAP:  Bone density:  Lipid panel:  Allergies  Allergen Reactions   Doxycycline  Rash    Rash on cheeks and sides of nose    Duloxetine  Rash    Current Outpatient Medications  Medication Sig Dispense Refill   albuterol  (VENTOLIN  HFA) 108 (90 Base) MCG/ACT inhaler Inhale 2 puffs into the lungs every 6 (six) hours as needed for wheezing or shortness of breath. 8 g 0   ASPIRIN  81 PO Take 81 mg by mouth daily.     atorvastatin  (LIPITOR) 10 MG tablet Take 1 tablet (10 mg total) by mouth in the morning. 90 tablet 1   Ca Phosphate-Cholecalciferol (CALTRATE GUMMY BITES) 250-10 MG-MCG CHEW Chew 1 gummy by mouth 2 (two) times daily. 180 tablet 3   chlorpheniramine-HYDROcodone  (TUSSIONEX) 10-8 MG/5ML Take 5 mLs by mouth at bedtime as needed for cough. 115 mL 0   diclofenac  (FLECTOR ) 1.3 % PTCH Place 1 patch onto the skin 2 (two) times daily. 60 patch 0   estrogens , conjugated, (PREMARIN ) 0.625 MG tablet Take 1 tablet (0.625 mg total) by mouth daily for 21 days, then do not take for 7 days. 90 tablet 0   famotidine  (PEPCID ) 10 MG tablet Take by mouth.     lenalidomide  (REVLIMID ) 10 MG capsule Take 1 capsule (10 mg total) by mouth daily. 28 capsule 0   levocetirizine (XYZAL ) 5 MG tablet Take 1 tablet (5 mg total) by mouth every evening. 30 tablet 1   loratadine  (CLARITIN ) 10 MG tablet Take 1 tablet (10 mg total) by mouth daily in the morning. 90 tablet 1   omeprazole  (PRILOSEC) 20 MG capsule Take 1 capsule (20 mg total) by mouth in the morning. 30 capsule 4   predniSONE  (DELTASONE ) 20 MG tablet 2 tabs poqday 1-3, 1 tabs poqday 4-6 9 tablet 0   pregabalin  (LYRICA ) 150 MG capsule Take 1 capsule (150 mg total) by mouth 3 (three) times daily. 90 capsule 1   tiZANidine  (ZANAFLEX ) 2 MG tablet Take 1 tablet (2 mg total) by mouth at bedtime.  90 tablet 1   umeclidinium-vilanterol (ANORO ELLIPTA ) 62.5-25 MCG/ACT AEPB Inhale 1 puff into the lungs daily. 180 each 1   vitamin B-12 (CYANOCOBALAMIN ) 500 MCG tablet Take 250 mcg by mouth every morning.     ARIPiprazole  (ABILIFY ) 5 MG tablet Take 1 tablet (5 mg total) by mouth daily. (Patient not taking: Reported on 12/18/2023) 90 tablet 1   No current facility-administered medications for this visit.    OBJECTIVE: Vitals:   01/13/24 0921  BP: 100/64  Pulse: 74  Resp: 20  Temp: 98.6 F (37 C)  SpO2: 100%        Body mass index is 28.57 kg/m.    ECOG  FS:0 - Asymptomatic  General: Well-developed, well-nourished, no acute distress. Eyes: Pink conjunctiva, anicteric sclera. HEENT: Normocephalic, moist mucous membranes. Lungs: No audible wheezing or coughing. Heart: Regular rate and rhythm. Abdomen: Soft, nontender, no obvious distention. Musculoskeletal: No edema, cyanosis, or clubbing. Neuro: Alert, answering all questions appropriately. Cranial nerves grossly intact. Skin: No rashes or petechiae noted. Psych: Normal affect.  LAB RESULTS:  Lab Results  Component Value Date   NA 135 01/13/2024   K 4.1 01/13/2024   CL 101 01/13/2024   CO2 25 01/13/2024   GLUCOSE 76 01/13/2024   BUN 16 01/13/2024   CREATININE 1.39 (H) 01/13/2024   CALCIUM  9.4 01/13/2024   PROT 6.1 (L) 01/13/2024   ALBUMIN 4.0 01/13/2024   AST 23 01/13/2024   ALT 24 01/13/2024   ALKPHOS 90 01/13/2024   BILITOT 1.5 (H) 01/13/2024   GFRNONAA 45 (L) 01/13/2024   GFRAA 79 12/06/2020    Lab Results  Component Value Date   WBC 2.7 (L) 01/13/2024   NEUTROABS 0.9 (L) 01/13/2024   HGB 12.1 01/13/2024   HCT 36.2 01/13/2024   MCV 108.1 (H) 01/13/2024   PLT 68 (L) 01/13/2024     STUDIES: DG Chest 2 View Result Date: 01/02/2024 CLINICAL DATA:  Shortness of breath. URI symptoms for 1 month. History of CLL. EXAM: CHEST - 2 VIEW COMPARISON:  Chest radiograph dated Feb 03, 2018. PET-CT dated August 06, 2023. FINDINGS: The heart size and mediastinal contours are within normal limits. Atherosclerotic calcification of the aortic arch. No focal consolidation, pleural effusion, or pneumothorax. Known mediastinal adenopathy is not well appreciated on this exam. No acute osseous abnormality. IMPRESSION: 1. No acute cardiopulmonary findings. 2. Known mediastinal adenopathy is not well appreciated on this exam and better evaluated on the prior PET-CT dated 08/06/2023. Electronically Signed   By: Mannie Seek M.D.   On: 01/02/2024 13:45   MM 3D SCREENING MAMMOGRAM BILATERAL BREAST Result Date: 12/18/2023 CLINICAL DATA:  Screening. EXAM: DIGITAL SCREENING BILATERAL MAMMOGRAM WITH TOMOSYNTHESIS AND CAD TECHNIQUE: Bilateral screening digital craniocaudal and mediolateral oblique mammograms were obtained. Bilateral screening digital breast tomosynthesis was performed. The images were evaluated with computer-aided detection. COMPARISON:  Previous exam(s). ACR Breast Density Category a: The breasts are almost entirely fatty. FINDINGS: The posterior most tissues on the left MLO are not included within the field of view due to a left shoulder injury. There are no findings suspicious for malignancy. IMPRESSION: No mammographic evidence of malignancy. A result letter of this screening mammogram will be mailed directly to the patient. RECOMMENDATION: Screening mammogram in one year. (Code:SM-B-01Y) BI-RADS CATEGORY  1: Negative. Electronically Signed   By: Alinda Apley M.D.   On: 12/18/2023 13:06     ONCOLOGY HISTORY:  Patient completed cycle 3 of Rituxan  plus Treanda  on March 26, 2018, but was then noted to have progressive disease.  She was then initiated on 480 mg Imbruvica  in November 2019, but had progression of disease with increasing white blood cell count as well as CT scan on Jan 22, 2022 revealing increased lymphadenopathy consistent with progression of disease.  Attempted patient on venetoclax , but she could  not tolerate it secondary to persistent diarrhea and transaminitis.   ASSESSMENT: CLL.  PLAN:    CLL: Confirmed by peripheral blood flow cytometry.  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  Patient initially completed 4 weekly cycles of Rituxan  then was transition to treatment every 4  weeks.  She last received Rituxan  on May 07, 2023.  She also recently completed XRT to her left axilla.  PET scan results from August 06, 2023 reviewed independently and also discussed at tumor board with interventional radiology.  Biopsy completed of hypermetabolic mass was consistent with her known diagnosis of CLL with no evidence of aggressive transformation.  Patient has now completed XRT to this lesion. Continue Claritin  and famotidine  along with her 10 mg Revlimid  daily.  Patient has discontinued prednisone .  Return to clinic in 4 weeks for further evaluation and continuation of treatment.  Will repeat PET scan prior to next clinic visit.   Appreciate clinical pharmacy input. Neutropenia: Chronic and unchanged.  Patient's ANC is 0.9.  Proceed cautiously with Revlimid  as above. Thrombocytopenia: Platelet count mildly reduced to 68, monitor. Anemia: Resolved.  Patient's hemoglobin is now within normal limits. Trigeminal neuralgia: Resolved.  MRI of the brain on Jan 25, 2019 was unremarkable.   Depression/anxiety: Chronic and unchanged.  Continue follow-up and treatment per primary care. Neuropathic pain/peripheral neuropathy: Chronic and unchanged.  She reports she is now on disability.  Continue current follow-up and treatment as per neurology. Rituxan  reaction: Patient will require additional premedications for preventative measures if Rituxan  is reinitiated in the future. Rash: Resolved.  Continue Claritin  and famotidine  along with Revlimid .  Weight gain: Patient has discontinued prednisone . Transaminitis: Resolved. Renal  insufficiency: Mild.  Patient's creatinine is 1.39.    Patient expressed understanding and was in agreement with this plan. She also understands that She can call clinic at any time with any questions, concerns, or complaints.    Shellie Dials, MD   01/13/2024 11:59 AM

## 2024-01-13 NOTE — Progress Notes (Signed)
 Patient states at the beginning of the week both hands started to lock up and cramp.

## 2024-01-13 NOTE — Progress Notes (Signed)
 Clinical Pharmacist Practitioner Clinic [facility]  Telephone:([phone] [phone]  Patient Care Team: Sowles, Krichna, MD as PCP - General (Family Medicine) Shellie Dials, MD as Consulting Physician (Oncology) Devon Fogo, [facility] (Inactive) as Pharmacist (Pharmacist) Queenie Brunet, RN as Registered Nurse (Oncology) Borders, Carlene Che, NP as Nurse Practitioner (Hospice and Palliative Medicine) Glenis Langdon, MD as Consulting Physician (Radiation Oncology) Pa, [facility]   Name of the patient: Monique [name]  [id number]  03/22/1971   Date of visit: 01/13/24  HPI: Patient is a 53 y.o. female with progressive CLL, previously treated with ibrutinib . Patient was intolerant to venetoclax  treatment initiation and did not want to retry initiating venetoclax . The plan is to now treat her with rituximab  and lenalidomide . Patient started rituximab  on 02/28/22 and lenalidomide  03/01/22.   On 04/03/22, patient presented to clinic with a rash. Her lenalidomide  was held and she was treated with prednisone  (1 week) and loratadine  continuosly. Rash improved by 04/10/22 f/u appt and she was restarted on lenalidomide .   On 04/28/22, patient called to report her rash had worsened and she stopped the lenalidomide  on 04/23/22. She was stated on another prednisone  taper. She was to taper down to 5mg  daily, then resume her lenalidomide . She resumed her lenalidomide  on 05/16/22. Patient stopped rituximab  after receiving 16 cycles, last dose 05/07/23.   Patient's lenalidomide  was held on 11/17/23 due to neutropenia and thrombocytopenia. Patient's counts have recovered and she will resume treatment on 12/17/23.  Reason for Consult: Oral chemotherapy follow-up for lenalidomide  therapy.   PAST MEDICAL HISTORY: Past Medical History:  Diagnosis Date   Anxiety    Bursitis    leg pain   Carpal tunnel syndrome    Cervical dysplasia    hx LEEP over 18 years ago.     Chronic lymphocytic leukemia (HCC) 2018   Dr Adrian Alba.  (in lymph nodes)   COPD (chronic obstructive pulmonary disease) (HCC)    COVID-19 2021   Depression    Eating disorder    GERD (gastroesophageal reflux disease)    History of self-harm    Insomnia    Obsession    Tobacco abuse    Vitamin B12 deficiency (non anemic)     HEMATOLOGY/ONCOLOGY HISTORY:  Oncology History Overview Note  Patient with recent CLL diagnosis by flow cytometry.  Previously not being treated in observation.   S/p 4 cycles of weekly Rituxan  completing on 02/20/2017 scans from January 2018 revealed minimal progression.   Developed right breast mass in March 2019.  Had diagnostic mammogram performed on 12/26/2017 showed suspicious right breast mass.  Biopsy revealed granulomatous mastitis.  She was treated with doxycycline  100 mg twice daily for 7 days.   Developed worsening adenopathy.  CT chest/abdomen/pelvis and soft tissue neck from May 2019 revealed progressive bulky adenopathy in the chest, abdomen and pelvis compatible with worsening disease.   Dr. Adrian Alba recommended beginning treatment with Rituxan  and Treanda .  Received first cycle last week.  S/p 6 cycles of Rituxan  and Treanda .  Tolerating well.  Last dose given 03/25/18.   CLL (chronic lymphocytic leukemia) (HCC)  11/24/2016 Initial Diagnosis   CLL (chronic lymphocytic leukemia) (HCC)   01/21/2018 - 03/26/2018 Chemotherapy   The patient had palonosetron  (ALOXI ) injection 0.25 mg, 0.25 mg, Intravenous,  Once, 4 of 5 cycles Administration: 0.25 mg (01/28/2018), 0.25 mg (02/25/2018), 0.25 mg (03/25/2018) riTUXimab  (RITUXAN ) 700 mg in sodium chloride  0.9 % 250 mL (2.1875 mg/mL) infusion, 375 mg/m2 = 700 mg, Intravenous,  Once, 4 of 5 cycles Administration: 700 mg (01/28/2018) bendamustine  (BENDEKA ) 150 mg in sodium chloride  0.9 % 50 mL (2.6786 mg/mL) chemo infusion, 90 mg/m2 = 150 mg, Intravenous,  Once, 4 of 5 cycles Administration: 150 mg (01/28/2018), 150  mg (01/29/2018), 150 mg (02/25/2018), 150 mg (02/26/2018), 150 mg (03/25/2018), 150 mg (03/26/2018)  for chemotherapy treatment.    02/28/2022 - 04/18/2022 Chemotherapy   Patient is on Treatment Plan : NON-HODGKINS LYMPHOMA Rituximab  q7d     02/28/2022 - 05/07/2023 Chemotherapy   Patient is on Treatment Plan : NON-HODGKINS LYMPHOMA Rituximab  q28d     03/12/2022 - 03/12/2022 Chemotherapy   Patient is on Treatment Plan : LYMPHOMA CLL/SLL Venetoclax  ramp up / Venetoclax  + Rituximab  (375/500) q28d x 6 cycles / Venetoclax  q28d x 18 cycles       ALLERGIES:  is allergic to doxycycline  and duloxetine .  MEDICATIONS:  Current Outpatient Medications  Medication Sig Dispense Refill   albuterol  (VENTOLIN  HFA) 108 (90 Base) MCG/ACT inhaler Inhale 2 puffs into the lungs every 6 (six) hours as needed for wheezing or shortness of breath. 8 g 0   ARIPiprazole  (ABILIFY ) 5 MG tablet Take 1 tablet (5 mg total) by mouth daily. (Patient not taking: Reported on 12/18/2023) 90 tablet 1   ASPIRIN  81 PO Take 81 mg by mouth daily.     atorvastatin  (LIPITOR) 10 MG tablet Take 1 tablet (10 mg total) by mouth in the morning. 90 tablet 1   Ca Phosphate-Cholecalciferol (CALTRATE GUMMY BITES) 250-10 MG-MCG CHEW Chew 1 gummy by mouth 2 (two) times daily. 180 tablet 3   chlorpheniramine-HYDROcodone  (TUSSIONEX) 10-8 MG/5ML Take 5 mLs by mouth at bedtime as needed for cough. 115 mL 0   diclofenac  (FLECTOR ) 1.3 % PTCH Place 1 patch onto the skin 2 (two) times daily. 60 patch 0   estrogens , conjugated, (PREMARIN ) 0.625 MG tablet Take 1 tablet (0.625 mg total) by mouth daily for 21 days, then do not take for 7 days. 90 tablet 0   famotidine  (PEPCID ) 10 MG tablet Take by mouth.     lenalidomide  (REVLIMID ) 10 MG capsule Take 1 capsule (10 mg total) by mouth daily. 28 capsule 0   levocetirizine (XYZAL ) 5 MG tablet Take 1 tablet (5 mg total) by mouth every evening. 30 tablet 1   loratadine  (CLARITIN ) 10 MG tablet Take 1 tablet (10 mg total) by  mouth daily in the morning. 90 tablet 1   omeprazole  (PRILOSEC) 20 MG capsule Take 1 capsule (20 mg total) by mouth in the morning. 30 capsule 4   predniSONE  (DELTASONE ) 20 MG tablet 2 tabs poqday 1-3, 1 tabs poqday 4-6 9 tablet 0   pregabalin  (LYRICA ) 150 MG capsule Take 1 capsule (150 mg total) by mouth 3 (three) times daily. 90 capsule 1   tiZANidine  (ZANAFLEX ) 2 MG tablet Take 1 tablet (2 mg total) by mouth at bedtime. 90 tablet 1   umeclidinium-vilanterol (ANORO ELLIPTA ) 62.5-25 MCG/ACT AEPB Inhale 1 puff into the lungs daily. 180 each 1   vitamin B-12 (CYANOCOBALAMIN ) 500 MCG tablet Take 250 mcg by mouth every morning.     No current facility-administered medications for this visit.    VITAL SIGNS: LMP 08/17/2016  There were no vitals filed for this visit.  Estimated body mass index is 28.57 kg/m as calculated from the following:   Height as of 01/02/24: 5\' 7"  (1.702 m).   Weight as of an earlier encounter on 01/13/24: 82.7 kg (182 lb 6.4 oz).  LABS: CBC:  Component Value Date/Time   WBC 2.7 (L) 01/13/2024 0909   HGB 12.1 01/13/2024 0909   HCT 36.2 01/13/2024 0909   PLT 68 (L) 01/13/2024 0909   MCV 108.1 (H) 01/13/2024 0909   NEUTROABS 0.9 (L) 01/13/2024 0909   LYMPHSABS 1.5 01/13/2024 0909   MONOABS 0.2 01/13/2024 0909   EOSABS 0.1 01/13/2024 0909   BASOSABS 0.0 01/13/2024 0909   Comprehensive Metabolic Panel:    Component Value Date/Time   NA 135 01/13/2024 0909   NA 138 05/13/2018 1413   K 4.1 01/13/2024 0909   CL 101 01/13/2024 0909   CO2 25 01/13/2024 0909   BUN 16 01/13/2024 0909   BUN 15 05/13/2018 1413   CREATININE 1.39 (H) 01/13/2024 0909   CREATININE 0.96 12/06/2020 1646   GLUCOSE 76 01/13/2024 0909   CALCIUM  9.4 01/13/2024 0909   AST 23 01/13/2024 0909   ALT 24 01/13/2024 0909   ALKPHOS 90 01/13/2024 0909   BILITOT 1.5 (H) 01/13/2024 0909   PROT 6.1 (L) 01/13/2024 0909   PROT 6.3 05/13/2018 1413   ALBUMIN 4.0 01/13/2024 0909   ALBUMIN 4.1  05/13/2018 1413     Present during today's visit: patient only  Assessment and Plan: CMP/CBC reviewed, continue lenalidomide  10mg  daily Overall patient is feeling better since she completed her antibiotic course for her sinus infection Patient to have repeat imagining completed prior to next office visit   Oral Chemotherapy Side Effect/Intolerance:  None reported  Oral Chemotherapy Adherence: No missed doses reported No patient barriers to medication adherence identified.   New medications: None reported  Medication Access Issues: No issues, patient fills medication at [facility]  Patient expressed understanding and was in agreement with this plan. She also understands that She can call clinic at any time with any questions, concerns, or complaints.   Follow-up plan: RTC as scheduled  Thank you for allowing me to participate in the care of this very pleasant patient.   Time Total: 15 mins  Visit consisted of counseling and education on dealing with issues of symptom management in the setting of serious and potentially life-threatening illness.Greater than 50%  of this time was spent counseling and coordinating care related to the above assessment and plan.  Signed by: Amani Marseille N. Jaskirat Zertuche, PharmD, Lorraine Roses, CPP Hematology/Oncology Clinical Pharmacist Practitioner [facility] [phone]  01/13/2024 10:19 AM

## 2024-01-14 ENCOUNTER — Other Ambulatory Visit: Payer: Self-pay

## 2024-01-15 ENCOUNTER — Encounter: Payer: Self-pay | Admitting: Oncology

## 2024-01-15 ENCOUNTER — Other Ambulatory Visit: Payer: Self-pay

## 2024-01-16 ENCOUNTER — Encounter: Payer: Self-pay | Admitting: Oncology

## 2024-01-16 ENCOUNTER — Other Ambulatory Visit (HOSPITAL_COMMUNITY): Payer: Self-pay

## 2024-01-16 ENCOUNTER — Other Ambulatory Visit: Payer: Self-pay

## 2024-01-19 ENCOUNTER — Other Ambulatory Visit: Payer: Self-pay

## 2024-01-20 ENCOUNTER — Other Ambulatory Visit (HOSPITAL_COMMUNITY): Payer: Self-pay

## 2024-01-21 ENCOUNTER — Other Ambulatory Visit: Payer: Self-pay

## 2024-01-21 ENCOUNTER — Other Ambulatory Visit: Payer: Self-pay | Admitting: Physician Assistant

## 2024-01-21 ENCOUNTER — Other Ambulatory Visit (HOSPITAL_COMMUNITY): Payer: Self-pay

## 2024-01-21 ENCOUNTER — Other Ambulatory Visit
Admission: RE | Admit: 2024-01-21 | Discharge: 2024-01-21 | Disposition: A | Discharge status: Home / Self Care | Source: Ambulatory Visit | Attending: Physician Assistant | Admitting: Physician Assistant

## 2024-01-21 DIAGNOSIS — R519 Headache, unspecified: Secondary | ICD-10-CM | POA: Diagnosis not present

## 2024-01-21 DIAGNOSIS — E785 Hyperlipidemia, unspecified: Secondary | ICD-10-CM | POA: Diagnosis not present

## 2024-01-21 DIAGNOSIS — G5711 Meralgia paresthetica, right lower limb: Secondary | ICD-10-CM | POA: Diagnosis not present

## 2024-01-21 DIAGNOSIS — N1831 Chronic kidney disease, stage 3a: Secondary | ICD-10-CM | POA: Diagnosis not present

## 2024-01-21 DIAGNOSIS — R7989 Other specified abnormal findings of blood chemistry: Secondary | ICD-10-CM

## 2024-01-21 DIAGNOSIS — G5603 Carpal tunnel syndrome, bilateral upper limbs: Secondary | ICD-10-CM | POA: Diagnosis not present

## 2024-01-21 DIAGNOSIS — E538 Deficiency of other specified B group vitamins: Secondary | ICD-10-CM | POA: Insufficient documentation

## 2024-01-21 DIAGNOSIS — Z860100 Personal history of colon polyps, unspecified: Secondary | ICD-10-CM | POA: Diagnosis not present

## 2024-01-21 DIAGNOSIS — R202 Paresthesia of skin: Secondary | ICD-10-CM | POA: Insufficient documentation

## 2024-01-21 DIAGNOSIS — G500 Trigeminal neuralgia: Secondary | ICD-10-CM | POA: Diagnosis not present

## 2024-01-21 DIAGNOSIS — E611 Iron deficiency: Secondary | ICD-10-CM | POA: Diagnosis not present

## 2024-01-21 DIAGNOSIS — Z8669 Personal history of other diseases of the nervous system and sense organs: Secondary | ICD-10-CM | POA: Insufficient documentation

## 2024-01-21 DIAGNOSIS — Z008 Encounter for other general examination: Secondary | ICD-10-CM | POA: Diagnosis not present

## 2024-01-21 DIAGNOSIS — F50019 Anorexia nervosa, restricting type, unspecified: Secondary | ICD-10-CM | POA: Diagnosis not present

## 2024-01-21 DIAGNOSIS — D649 Anemia, unspecified: Secondary | ICD-10-CM | POA: Insufficient documentation

## 2024-01-21 DIAGNOSIS — M79604 Pain in right leg: Secondary | ICD-10-CM | POA: Diagnosis not present

## 2024-01-21 DIAGNOSIS — R200 Anesthesia of skin: Secondary | ICD-10-CM | POA: Insufficient documentation

## 2024-01-21 DIAGNOSIS — D6859 Other primary thrombophilia: Secondary | ICD-10-CM | POA: Diagnosis not present

## 2024-01-21 DIAGNOSIS — F17210 Nicotine dependence, cigarettes, uncomplicated: Secondary | ICD-10-CM | POA: Diagnosis not present

## 2024-01-21 DIAGNOSIS — R2689 Other abnormalities of gait and mobility: Secondary | ICD-10-CM | POA: Diagnosis not present

## 2024-01-21 DIAGNOSIS — G2581 Restless legs syndrome: Secondary | ICD-10-CM | POA: Diagnosis not present

## 2024-01-21 DIAGNOSIS — Z6828 Body mass index (BMI) 28.0-28.9, adult: Secondary | ICD-10-CM | POA: Diagnosis not present

## 2024-01-21 DIAGNOSIS — I824Z1 Acute embolism and thrombosis of unspecified deep veins of right distal lower extremity: Secondary | ICD-10-CM

## 2024-01-21 DIAGNOSIS — R2681 Unsteadiness on feet: Secondary | ICD-10-CM | POA: Diagnosis not present

## 2024-01-21 DIAGNOSIS — E663 Overweight: Secondary | ICD-10-CM | POA: Diagnosis not present

## 2024-01-21 DIAGNOSIS — F3341 Major depressive disorder, recurrent, in partial remission: Secondary | ICD-10-CM | POA: Diagnosis not present

## 2024-01-21 LAB — D-DIMER, QUANTITATIVE: D-Dimer, Quant: 0.86 ug{FEU}/mL — ABNORMAL HIGH (ref 0.00–0.50)

## 2024-01-21 MED ORDER — NORTRIPTYLINE HCL 10 MG PO CAPS
ORAL_CAPSULE | ORAL | 3 refills | Status: DC
Start: 2024-01-21 — End: 2024-05-03
  Filled 2024-01-21: qty 60, 30d supply, fill #0
  Filled 2024-02-02 – 2024-02-18 (×2): qty 60, 30d supply, fill #1
  Filled 2024-03-15: qty 60, 30d supply, fill #2
  Filled 2024-04-13: qty 60, 30d supply, fill #3

## 2024-01-21 MED ORDER — PREGABALIN 150 MG PO CAPS
150.0000 mg | ORAL_CAPSULE | Freq: Three times a day (TID) | ORAL | 1 refills | Status: DC
Start: 2024-01-21 — End: 2024-05-03

## 2024-01-21 MED ORDER — ATORVASTATIN CALCIUM 10 MG PO TABS
10.0000 mg | ORAL_TABLET | Freq: Every day | ORAL | 3 refills | Status: AC
  Filled 2024-01-22 – 2024-01-30 (×8): qty 100, 100d supply, fill #0
  Filled 2024-02-17: qty 30, 30d supply, fill #0
  Filled 2024-03-18: qty 30, 30d supply, fill #1
  Filled 2024-04-20 (×2): qty 30, 30d supply, fill #2
  Filled 2024-05-18 – 2024-05-19 (×2): qty 30, 30d supply, fill #3
  Filled 2024-06-17 – 2024-06-23 (×2): qty 30, 30d supply, fill #4
  Filled 2024-07-27: qty 30, 30d supply, fill #5
  Filled 2024-08-13 – 2024-08-19 (×3): qty 30, 30d supply, fill #6
  Filled 2024-09-21: qty 30, 30d supply, fill #7
  Filled 2024-10-21: qty 30, 30d supply, fill #8
  Filled ????-??-??: fill #0

## 2024-01-22 ENCOUNTER — Other Ambulatory Visit: Payer: Self-pay | Admitting: Physician Assistant

## 2024-01-22 ENCOUNTER — Other Ambulatory Visit (HOSPITAL_COMMUNITY): Payer: Self-pay

## 2024-01-22 ENCOUNTER — Ambulatory Visit
Admission: RE | Admit: 2024-01-22 | Discharge: 2024-01-22 | Disposition: A | Discharge status: Home / Self Care | Source: Ambulatory Visit | Attending: Physician Assistant | Admitting: Physician Assistant

## 2024-01-22 ENCOUNTER — Encounter: Payer: Self-pay | Admitting: Oncology

## 2024-01-22 DIAGNOSIS — I824Z1 Acute embolism and thrombosis of unspecified deep veins of right distal lower extremity: Secondary | ICD-10-CM | POA: Diagnosis not present

## 2024-01-22 DIAGNOSIS — R200 Anesthesia of skin: Secondary | ICD-10-CM | POA: Diagnosis not present

## 2024-01-22 DIAGNOSIS — R7989 Other specified abnormal findings of blood chemistry: Secondary | ICD-10-CM | POA: Insufficient documentation

## 2024-01-22 DIAGNOSIS — R202 Paresthesia of skin: Secondary | ICD-10-CM

## 2024-01-22 DIAGNOSIS — G8929 Other chronic pain: Secondary | ICD-10-CM

## 2024-01-22 DIAGNOSIS — R2689 Other abnormalities of gait and mobility: Secondary | ICD-10-CM

## 2024-01-23 ENCOUNTER — Other Ambulatory Visit: Payer: Self-pay

## 2024-01-23 ENCOUNTER — Encounter: Payer: Self-pay | Admitting: Oncology

## 2024-01-23 ENCOUNTER — Other Ambulatory Visit (HOSPITAL_COMMUNITY): Payer: Self-pay

## 2024-01-23 ENCOUNTER — Encounter: Payer: Self-pay | Admitting: Physician Assistant

## 2024-01-24 ENCOUNTER — Other Ambulatory Visit (HOSPITAL_COMMUNITY): Payer: Self-pay

## 2024-01-26 ENCOUNTER — Other Ambulatory Visit (HOSPITAL_COMMUNITY): Payer: Self-pay

## 2024-01-27 ENCOUNTER — Other Ambulatory Visit (HOSPITAL_COMMUNITY): Payer: Self-pay

## 2024-01-28 ENCOUNTER — Other Ambulatory Visit (HOSPITAL_COMMUNITY): Payer: Self-pay

## 2024-01-29 ENCOUNTER — Ambulatory Visit: Payer: Self-pay

## 2024-01-29 ENCOUNTER — Encounter: Payer: Self-pay | Admitting: Family Medicine

## 2024-01-29 ENCOUNTER — Encounter: Payer: Self-pay | Admitting: Oncology

## 2024-01-29 ENCOUNTER — Other Ambulatory Visit (HOSPITAL_COMMUNITY): Payer: Self-pay

## 2024-01-29 ENCOUNTER — Ambulatory Visit: Admitting: Family Medicine

## 2024-01-29 VITALS — BP 108/68 | HR 98 | Resp 16 | Ht 67.0 in | Wt 181.0 lb

## 2024-01-29 DIAGNOSIS — G5711 Meralgia paresthetica, right lower limb: Secondary | ICD-10-CM

## 2024-01-29 DIAGNOSIS — M25812 Other specified joint disorders, left shoulder: Secondary | ICD-10-CM | POA: Diagnosis not present

## 2024-01-29 DIAGNOSIS — R0789 Other chest pain: Secondary | ICD-10-CM

## 2024-01-29 MED ORDER — BACLOFEN 10 MG PO TABS: 10.0000 mg | ORAL_TABLET | Freq: Two times a day (BID) | ORAL | 0 refills | Status: DC

## 2024-01-29 NOTE — Telephone Encounter (Signed)
 Copied from CRM [id number]. Topic: Clinical - Red Word Triage >> Jan 29, 2024  8:39 AM Ivette P wrote: Red Word that prompted transfer to Nurse Triage: having issues middle under arm. until middle of boob. very bruised and tender. no bruisng on the outside.  Chief Complaint: left arm pain Symptoms: pain Frequency: constant Pertinent Negatives: Patient denies cp, sob, cough, injury Disposition: [] ED /[] Urgent Care (no appt availability in office) / [x] Appointment(In office/virtual)/ []  [facility]/ [] Home Care/ [] Refused Recommended Disposition /[] [facility]/ []  Follow-up with PCP Additional Notes: apt made per protocol; care advice given, denies questions; instructed to go to ER if becomes worse.   Reason for Disposition  [1] MODERATE pain (e.g., interferes with normal activities) AND [2] present > 3 days  Answer Assessment - Initial Assessment Questions 1. ONSET: "When did the pain start?"     yesterday 2. LOCATION: "Where is the pain located?"     Under arm left arm 3. PAIN: "How bad is the pain?" (Scale 1-10; or mild, moderate, severe)   - MILD (1-3): Doesn't interfere with normal activities.   - MODERATE (4-7): Interferes with normal activities (e.g., work or school) or awakens from sleep.   - SEVERE (8-10): Excruciating pain, unable to do any normal activities, unable to hold a cup of water.     Severe when coughing 4. WORK OR EXERCISE: "Has there been any recent work or exercise that involved this part of the body?"     denies 5. CAUSE: "What do you think is causing the arm pain?"     Unknown, states arm has been bothering her and can't lift arm above head; was seen by neurologist  6. OTHER SYMPTOMS: "Do you have any other symptoms?" (e.g., neck pain, swelling, rash, fever, numbness, weakness)     Sweating - states hot flashes, numb leg 7. PREGNANCY: "Is there any chance you are pregnant?" "When was your last menstrual period?"     na  Protocols used: Arm  Pain-A-AH

## 2024-01-29 NOTE — Telephone Encounter (Signed)
Scheduled for 10 today

## 2024-01-29 NOTE — Progress Notes (Signed)
 Name: Monique [name]   MRN: [id number]    DOB: 12/24/1970   Date:01/29/2024       Progress Note  Subjective  Chief Complaint  Chief Complaint  Patient presents with   Arm Pain    L arm, having issues middle under arm. until middle of boob. very bruised and tender. no bruisng on the outside. Since yesterday, hurts while being active    Discussed the use of AI scribe software for clinical note transcription with the patient, who gave verbal consent to proceed.  History of Present Illness Monique [name] is a 53 year old female with chronic neuropathy who presents with acute left arm and chest wall pain.  She experiences acute pain in her left axilla  and chest wall, which began yesterday. The pain is described as harsh, located in the middle part of her underarm, extending to the middle of her breast. It is very tender and sore, exacerbated by coughing, sneezing, and certain movements, also painful to palpation of chest wall. There has been no recent lifting or unusual activity that could have triggered the pain. Denies a rash in the area  She has a history of chronic neuropathy with associated leg weakness and sometimes experiences balance issues when her legs cramp. She is currently taking Lyrica  (pregabalin ) 150 mg three times a day, but it is not helping as much, especially with her thigh pain. She also experiences restless leg syndrome and has been prescribed nortriptyline , starting at 10 mg and increasing to 20 mg. Recently, she experienced numbness in her right thigh, which led to a neurologist consultation. A positive D-dimer test was followed by an ultrasound of the right leg, which showed no clots. She is scheduled for an  spine  MRI and an updated EMG.  She has a history of facial pain previously diagnosed as trigeminal neuralgia, which has since resolved. No current facial pain.  She takes baby aspirin  and denies the use of other blood thinners. She has experienced bruising and  weight gain, which she attributes to her current health issues.  She has COPD but denies increased coughing or shortness of breath. She reports night sweats but no fever or chills. Her mammogram is up to date.  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 06/05/2021   Lumbosacral radiculopathy/radiculitis at L5 (Bilateral) 06/05/2021   PAD (peripheral artery disease) (HCC) 02/14/2021   CIN II (cervical intraepithelial neoplasia II) 02/01/2021   S/P vaginal hysterectomy 01/22/2021   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/09/2020   Bilateral leg pain 11/03/2019   Iron  deficiency anemia 10/15/2019   Bilateral arm pain 08/05/2019   Bilateral hand numbness 08/05/2019   Weakness of both hands 08/05/2019   Chronic musculoskeletal pain 07/19/2019   Chronic neuropathic pain 07/19/2019   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   Special screening for malignant neoplasms, colon    Polyp of sigmoid colon    Hemorrhoids    Trigeminal neuralgia 10/05/2018  Lumbar radiculitis (Right) 09/24/2018   Hypocalcemia 06/03/2018   Vitamin D  insufficiency 06/03/2018   Abnormal MRI, lumbar spine (06/02/2020) 06/03/2018   Chronic hip pain (Right) 06/03/2018   Spondylosis without myelopathy or radiculopathy, lumbar region 06/03/2018   DDD (degenerative disc disease), lumbar 06/02/2018   Lumbar facet hypertrophy 06/02/2018   Lumbar facet arthropathy 06/02/2018    Lumbar facet syndrome (Bilateral) (R>L) 06/02/2018   Marijuana use 05/19/2018   Chronic lower extremity pain (1ry area of Pain) (Bilateral) (R>L) 05/13/2018   Chronic low back pain (2ry area of Pain) (Bilateral) (R>L) w/ sciatica (Bilateral) 05/13/2018   Chronic pain syndrome 05/13/2018   Disorder of skeletal system 05/13/2018   Pharmacologic therapy 05/13/2018   Problems influencing health status 05/13/2018   Mastalgia 05/05/2018   Breast mass, right 01/07/2018   Face pain 12/11/2017   Tingling 12/11/2017   Thoracic aortic atherosclerosis (HCC) 08/20/2017   Emphysema of lung (HCC) 08/20/2017   Adductor tendinitis 04/23/2017   Trochanteric bursitis of right hip 04/23/2017   GERD without esophagitis 12/11/2016   CLL (chronic lymphocytic leukemia) (HCC) 11/24/2016   Stress incontinence 09/04/2016   B12 deficiency 07/10/2015   Insomnia 07/10/2015   Anorexia nervosa, restricting type 07/10/2015   Anxiety, generalized 07/10/2015   H/O suicide attempt 07/10/2015   Lymphocytosis 07/10/2015   Obsessive-compulsive disorder 07/10/2015   Tobacco use 07/10/2015   History of cervical dysplasia 07/18/2014    Social History   Tobacco Use   Smoking status: Every Day    Types: E-cigarettes   Smokeless tobacco: Never  Substance Use Topics   Alcohol use: Not Currently    Alcohol/week: 0.0 standard drinks of alcohol    Comment: rarely     Current Outpatient Medications:    albuterol  (VENTOLIN  HFA) 108 (90 Base) MCG/ACT inhaler, Inhale 2 puffs into the lungs every 6 (six) hours as needed for wheezing or shortness of breath., Disp: 8 g, Rfl: 0   ARIPiprazole  (ABILIFY ) 5 MG tablet, Take 1 tablet (5 mg total) by mouth daily., Disp: 90 tablet, Rfl: 1   ASPIRIN  81 PO, Take 81 mg by mouth daily., Disp: , Rfl:    atorvastatin  (LIPITOR) 10 MG tablet, Take 1 tablet (10 mg total) by mouth in the morning., Disp: 90 tablet, Rfl: 1   atorvastatin  (LIPITOR) 10 MG tablet, Take 1 tablet (10 mg total) by  mouth daily., Disp: 100 tablet, Rfl: 3   Ca Phosphate-Cholecalciferol (CALTRATE GUMMY BITES) 250-10 MG-MCG CHEW, Chew 1 gummy by mouth 2 (two) times daily., Disp: 180 tablet, Rfl: 3   chlorpheniramine-HYDROcodone  (TUSSIONEX) 10-8 MG/5ML, Take 5 mLs by mouth at bedtime as needed for cough., Disp: 115 mL, Rfl: 0   diclofenac  (FLECTOR ) 1.3 % PTCH, Place 1 patch onto the skin 2 (two) times daily., Disp: 60 patch, Rfl: 0   estrogens , conjugated, (PREMARIN ) 0.625 MG tablet, Take 1 tablet (0.625 mg total) by mouth daily for 21 days, then do not take for 7 days., Disp: 90 tablet, Rfl: 0   famotidine  (PEPCID ) 10 MG tablet, Take by mouth., Disp: , Rfl:    lenalidomide  (REVLIMID ) 10 MG capsule, Take 1 capsule (10 mg total) by mouth daily., Disp: 28 capsule, Rfl: 0   levocetirizine (XYZAL ) 5 MG tablet, Take 1 tablet (5 mg total) by mouth every evening., Disp: 30 tablet, Rfl: 1   loratadine  (CLARITIN ) 10 MG tablet, Take 1 tablet (10 mg total) by mouth daily in the morning., Disp: 90 tablet, Rfl: 1   nortriptyline  (PAMELOR ) 10 MG capsule, Take  1 tablet (10 mg total) by mouth at night for one week then increase to 20 mg at night, Disp: 60 capsule, Rfl: 3   omeprazole  (PRILOSEC) 20 MG capsule, Take 1 capsule (20 mg total) by mouth in the morning., Disp: 30 capsule, Rfl: 4   predniSONE  (DELTASONE ) 20 MG tablet, 2 tabs poqday 1-3, 1 tabs poqday 4-6, Disp: 9 tablet, Rfl: 0   pregabalin  (LYRICA ) 150 MG capsule, Take 1 capsule (150 mg total) by mouth 3 (three) times daily., Disp: 90 capsule, Rfl: 1   tiZANidine  (ZANAFLEX ) 2 MG tablet, Take 1 tablet (2 mg total) by mouth at bedtime., Disp: 90 tablet, Rfl: 1   umeclidinium-vilanterol (ANORO ELLIPTA ) 62.5-25 MCG/ACT AEPB, Inhale 1 puff into the lungs daily., Disp: 180 each, Rfl: 1   vitamin B-12 (CYANOCOBALAMIN ) 500 MCG tablet, Take 250 mcg by mouth every morning., Disp: , Rfl:   Allergies  Allergen Reactions   Doxycycline  Rash    Rash on cheeks and sides of nose     Duloxetine  Rash    ROS  Ten systems reviewed and is negative except as mentioned in HPI    Objective  Vitals:   01/29/24 0954  BP: 108/68  Pulse: 98  Resp: 16  SpO2: 93%  Weight: 181 lb (82.1 kg)  Height: 5\' 7"  (1.702 m)    Body mass index is 28.35 kg/m.   Physical Exam VITALS: P- 98, SaO2- 93% CONSTITUTIONAL: Patient appears well-developed and well-nourished. No distress. HEENT: Head atraumatic, normocephalic, neck supple. CARDIOVASCULAR: Normal rate, regular rhythm and normal heart sounds. No murmur heard. No BLE edema. PULMONARY: Effort normal and breath sounds normal. No respiratory distress. Tenderness in the left anterior chest wall, muscular in nature. ABDOMINAL: There is no tenderness or distention. MUSCULOSKELETAL: Normal gait. Without gross motor or sensory deficit. PSYCHIATRIC: Patient has a normal mood and affect. Behavior is normal. Judgment and thought content normal. BREAST: Breast exam normal on the right side, no masses or tenderness. Tenderness in the left breast, specific area noted. Breast tissue soft, no redness or bruising.  Recent Results (from the past 2160 hours)  Rad Onc Aria Session Summary     Status: None   Collection Time: 10/31/23 10:03 AM  Result Value Ref Range   Course ID [email or web address]    Course Intent Unknown    Course Start Date 10/16/2023  2:18 PM    Session Number 5    Course First Treatment Date 10/27/2023 11:12 AM    Course Last Treatment Date 10/31/2023 10:02 AM    Course Elapsed Days 4    Reference Point ID Abd dp    Reference Point Dosage Given to Date 10 Gy   Reference Point Session Dosage Given 2 Gy   Plan ID Abd    Plan Fractions Treated to Date 5    Plan Total Fractions Prescribed 15    Plan Prescribed Dose Per Fraction 2 Gy   Plan Total Prescribed Dose 30.000000 Gy   Plan Primary Reference Point Abd dp   Rad Onc Aria Session Summary     Status: None   Collection Time: 11/03/23  8:55 AM  Result Value Ref Range   Course ID  [email or web address]    Course Intent Unknown    Course Start Date 10/16/2023  2:18 PM    Session Number 6    Course First Treatment Date 10/27/2023 11:12 AM    Course Last Treatment Date 11/03/2023  8:54 AM    Course Elapsed Days 7  Reference Point ID Abd dp    Reference Point Dosage Given to Date 12 Gy   Reference Point Session Dosage Given 2 Gy   Plan ID Abd    Plan Fractions Treated to Date 6    Plan Total Fractions Prescribed 15    Plan Prescribed Dose Per Fraction 2 Gy   Plan Total Prescribed Dose 30.000000 Gy   Plan Primary Reference Point Abd dp   Rad Onc Aria Session Summary     Status: None   Collection Time: 11/04/23  9:20 AM  Result Value Ref Range   Course ID [email or web address]    Course Intent Unknown    Course Start Date 10/16/2023  2:18 PM    Session Number 7    Course First Treatment Date 10/27/2023 11:12 AM    Course Last Treatment Date 11/04/2023  9:18 AM    Course Elapsed Days 8    Reference Point ID Abd dp    Reference Point Dosage Given to Date 14 Gy   Reference Point Session Dosage Given 2 Gy   Plan ID Abd    Plan Fractions Treated to Date 7    Plan Total Fractions Prescribed 15    Plan Prescribed Dose Per Fraction 2 Gy   Plan Total Prescribed Dose 30.000000 Gy   Plan Primary Reference Point Abd dp   Rad Onc Aria Session Summary     Status: None   Collection Time: 11/05/23 10:24 AM  Result Value Ref Range   Course ID [email or web address]    Course Intent Unknown    Course Start Date 10/16/2023  2:18 PM    Session Number 8    Course First Treatment Date 10/27/2023 11:12 AM    Course Last Treatment Date 11/05/2023 10:22 AM    Course Elapsed Days 9    Reference Point ID Abd dp    Reference Point Dosage Given to Date 16 Gy   Reference Point Session Dosage Given 2 Gy   Plan ID Abd    Plan Fractions Treated to Date 8    Plan Total Fractions Prescribed 15    Plan Prescribed Dose Per Fraction 2 Gy   Plan Total Prescribed Dose 30.000000 Gy   Plan Primary Reference Point Abd dp   Rad Onc Aria  Session Summary     Status: None   Collection Time: 11/07/23  8:58 AM  Result Value Ref Range   Course ID [email or web address]    Course Intent Unknown    Course Start Date 10/16/2023  2:18 PM    Session Number 9    Course First Treatment Date 10/27/2023 11:12 AM    Course Last Treatment Date 11/07/2023  8:56 AM    Course Elapsed Days 11    Reference Point ID Abd dp    Reference Point Dosage Given to Date 18 Gy   Reference Point Session Dosage Given 2 Gy   Plan ID Abd    Plan Fractions Treated to Date 9    Plan Total Fractions Prescribed 15    Plan Prescribed Dose Per Fraction 2 Gy   Plan Total Prescribed Dose 30.000000 Gy   Plan Primary Reference Point Abd dp   Rad Onc Aria Session Summary     Status: None   Collection Time: 11/10/23  9:17 AM  Result Value Ref Range   Course ID [email or web address]    Course Intent Unknown    Course Start Date 10/16/2023  2:18 PM    Session Number 10    Course  First Treatment Date 10/27/2023 11:12 AM    Course Last Treatment Date 11/10/2023  9:15 AM    Course Elapsed Days 14    Reference Point ID Abd dp    Reference Point Dosage Given to Date 20 Gy   Reference Point Session Dosage Given 2 Gy   Plan ID Abd    Plan Fractions Treated to Date 10    Plan Total Fractions Prescribed 15    Plan Prescribed Dose Per Fraction 2 Gy   Plan Total Prescribed Dose 30.000000 Gy   Plan Primary Reference Point Abd dp   Rad Onc Aria Session Summary     Status: None   Collection Time: 11/11/23  9:06 AM  Result Value Ref Range   Course ID [email or web address]    Course Intent Unknown    Course Start Date 10/16/2023  2:18 PM    Session Number 11    Course First Treatment Date 10/27/2023 11:12 AM    Course Last Treatment Date 11/11/2023  9:05 AM    Course Elapsed Days 15    Reference Point ID Abd dp    Reference Point Dosage Given to Date 22 Gy   Reference Point Session Dosage Given 2 Gy   Plan ID Abd    Plan Fractions Treated to Date 11    Plan Total Fractions Prescribed 15    Plan Prescribed Dose Per  Fraction 2 Gy   Plan Total Prescribed Dose 30.000000 Gy   Plan Primary Reference Point Abd dp   Cervicovaginal ancillary only     Status: None   Collection Time: 11/11/23 10:41 AM  Result Value Ref Range   Neisseria Gonorrhea Negative    Chlamydia Negative    Trichomonas Negative    Bacterial Vaginitis (gardnerella) Negative    Candida Vaginitis Negative    Candida Glabrata Negative    Comment      Normal Reference Range Bacterial Vaginosis - Negative   Comment Normal Reference Ranger Chlamydia - Negative    Comment      Normal Reference Range Neisseria Gonorrhea - Negative   Comment Normal Reference Range Candida Species - Negative    Comment Normal Reference Range Candida Galbrata - Negative    Comment Normal Reference Range Trichomonas - Negative   POCT Urinalysis Dipstick     Status: Abnormal   Collection Time: 11/11/23 10:46 AM  Result Value Ref Range   Color, UA drk yellow    Clarity, UA clear    Glucose, UA Negative Negative   Bilirubin, UA neg    Ketones, UA neg    Spec Grav, UA 1.020 1.010 - 1.025   Blood, UA neg    pH, UA 6.0 5.0 - 8.0   Protein, UA Negative Negative   Urobilinogen, UA 0.2 0.2 or 1.0 E.U./dL   Nitrite, UA pos    Leukocytes, UA Large (3+) (A) Negative   Appearance clear    Odor normal   Urine Culture     Status: Abnormal   Collection Time: 11/11/23 10:53 AM   Specimen: Urine  Result Value Ref Range   MICRO NUMBER: [id number]    SPECIMEN QUALITY: Adequate    Sample Source URINE    STATUS: FINAL    ISOLATE 1: Escherichia coli (A)     Comment: 50,000-100,000 CFU/mL of Escherichia coli      Susceptibility   Escherichia coli - URINE CULTURE, REFLEX    AMOX/CLAVULANIC <=2 Sensitive     AMPICILLIN <=2 Sensitive     AMPICILLIN/SULBACTAM <=  2 Sensitive     CEFAZOLIN * <=4 Not Reportable      * For infections other than uncomplicated UTI caused by E. coli, K. pneumoniae or P. mirabilis: Cefazolin  is resistant if MIC > or = 8 mcg/mL. (Distinguishing  susceptible versus intermediate for isolates with MIC < or = 4 mcg/mL requires additional testing.) For uncomplicated UTI caused by E. coli, K. pneumoniae or P. mirabilis: Cefazolin  is susceptible if MIC <32 mcg/mL and predicts susceptible to the oral agents cefaclor, cefdinir, cefpodoxime, cefprozil, cefuroxime, cephalexin  and loracarbef.     CEFTAZIDIME <=1 Sensitive     CEFEPIME <=1 Sensitive     CEFTRIAXONE <=1 Sensitive     CIPROFLOXACIN  <=0.25 Sensitive     LEVOFLOXACIN  <=0.12 Sensitive     GENTAMICIN <=1 Sensitive     IMIPENEM <=0.25 Sensitive     NITROFURANTOIN  <=16 Sensitive     PIP/TAZO <=4 Sensitive     TOBRAMYCIN <=1 Sensitive     TRIMETH /SULFA * <=20 Sensitive      * For infections other than uncomplicated UTI caused by E. coli, K. pneumoniae or P. mirabilis: Cefazolin  is resistant if MIC > or = 8 mcg/mL. (Distinguishing susceptible versus intermediate for isolates with MIC < or = 4 mcg/mL requires additional testing.) For uncomplicated UTI caused by E. coli, K. pneumoniae or P. mirabilis: Cefazolin  is susceptible if MIC <32 mcg/mL and predicts susceptible to the oral agents cefaclor, cefdinir, cefpodoxime, cefprozil, cefuroxime, cephalexin  and loracarbef. Legend: S = Susceptible  I = Intermediate R = Resistant  NS = Not susceptible SDD = Susceptible Dose Dependent * = Not Tested  NR = Not Reported **NN = See Therapy Comments   Rad Onc Aria Session Summary     Status: None   Collection Time: 11/12/23  8:26 AM  Result Value Ref Range   Course ID [email or web address]    Course Intent Unknown    Course Start Date 10/16/2023  2:18 PM    Session Number 12    Course First Treatment Date 10/27/2023 11:12 AM    Course Last Treatment Date 11/12/2023  8:24 AM    Course Elapsed Days 16    Reference Point ID Abd dp    Reference Point Dosage Given to Date 24 Gy   Reference Point Session Dosage Given 2 Gy   Plan ID Abd    Plan Fractions Treated to Date 12    Plan Total Fractions  Prescribed 15    Plan Prescribed Dose Per Fraction 2 Gy   Plan Total Prescribed Dose 30.000000 Gy   Plan Primary Reference Point Abd dp   Rad Onc Aria Session Summary     Status: None   Collection Time: 11/13/23  8:10 AM  Result Value Ref Range   Course ID [email or web address]    Course Intent Unknown    Course Start Date 10/16/2023  2:18 PM    Session Number 13    Course First Treatment Date 10/27/2023 11:12 AM    Course Last Treatment Date 11/13/2023  8:09 AM    Course Elapsed Days 17    Reference Point ID Abd dp    Reference Point Dosage Given to Date 26 Gy   Reference Point Session Dosage Given 2 Gy   Plan ID Abd    Plan Fractions Treated to Date 13    Plan Total Fractions Prescribed 15    Plan Prescribed Dose Per Fraction 2 Gy   Plan Total Prescribed Dose 30.000000 Gy   Plan Primary Reference Point Abd dp  Rad Onc Aria Session Summary     Status: None   Collection Time: 11/14/23  8:15 AM  Result Value Ref Range   Course ID [email or web address]    Course Intent Unknown    Course Start Date 10/16/2023  2:18 PM    Session Number 14    Course First Treatment Date 10/27/2023 11:12 AM    Course Last Treatment Date 11/14/2023  8:14 AM    Course Elapsed Days 18    Reference Point ID Abd dp    Reference Point Dosage Given to Date 28 Gy   Reference Point Session Dosage Given 2 Gy   Plan ID Abd    Plan Fractions Treated to Date 14    Plan Total Fractions Prescribed 15    Plan Prescribed Dose Per Fraction 2 Gy   Plan Total Prescribed Dose 30.000000 Gy   Plan Primary Reference Point Abd dp   Rad Onc Aria Session Summary     Status: None   Collection Time: 11/17/23  9:36 AM  Result Value Ref Range   Course ID [email or web address]    Course Intent Unknown    Course Start Date 10/16/2023  2:18 PM    Session Number 15    Course First Treatment Date 10/27/2023 11:12 AM    Course Last Treatment Date 11/17/2023  9:34 AM    Course Elapsed Days 21    Reference Point ID Abd dp    Reference Point Dosage Given to Date 30 Gy    Reference Point Session Dosage Given 2 Gy   Plan ID Abd    Plan Fractions Treated to Date 15    Plan Total Fractions Prescribed 15    Plan Prescribed Dose Per Fraction 2 Gy   Plan Total Prescribed Dose 30.000000 Gy   Plan Primary Reference Point Abd dp   CMP (Cancer Center only)     Status: Abnormal   Collection Time: 11/17/23 10:07 AM  Result Value Ref Range   Sodium 135 135 - 145 mmol/L   Potassium 3.8 3.5 - 5.1 mmol/L   Chloride 101 98 - 111 mmol/L   CO2 24 22 - 32 mmol/L   Glucose, Bld 82 70 - 99 mg/dL    Comment: Glucose reference range applies only to samples taken after fasting for at least 8 hours.   BUN 15 6 - 20 mg/dL   Creatinine 9.56 (H) 2.13 - 1.00 mg/dL   Calcium  8.8 (L) 8.9 - 10.3 mg/dL   Total Protein 6.0 (L) 6.5 - 8.1 g/dL   Albumin 3.8 3.5 - 5.0 g/dL   AST 15 15 - 41 U/L   ALT 17 0 - 44 U/L   Alkaline Phosphatase 77 38 - 126 U/L   Total Bilirubin 1.1 0.0 - 1.2 mg/dL   GFR, Estimated 52 (L) >60 mL/min    Comment: (NOTE) Calculated using the CKD-EPI Creatinine Equation (2021)    Anion gap 10 5 - 15    Comment: Performed at [facility], [address] [place], [place] [address]  CBC with Differential/Platelet     Status: Abnormal   Collection Time: 11/17/23 10:08 AM  Result Value Ref Range   WBC 1.9 (L) 4.0 - 10.5 K/uL   RBC 2.94 (L) 3.87 - 5.11 MIL/uL   Hemoglobin 10.5 (L) 12.0 - 15.0 g/dL   HCT 84.6 (L) 96.2 - 95.2 %   MCV 102.7 (H) 80.0 - 100.0 fL   MCH 35.7 (H) 26.0 - 34.0 pg   MCHC 34.8  30.0 - 36.0 g/dL   RDW 14.7 82.9 - 56.2 %   Platelets 37 (L) 150 - 400 K/uL    Comment: Immature Platelet Fraction may be clinically indicated, consider ordering this additional test [id number]    nRBC 0.0 0.0 - 0.2 %   Neutrophils Relative % 23 %   Neutro Abs 0.5 (L) 1.7 - 7.7 K/uL   Lymphocytes Relative 60 %   Lymphs Abs 1.2 0.7 - 4.0 K/uL   Monocytes Relative 12 %   Monocytes Absolute 0.2 0.1 - 1.0 K/uL   Eosinophils Relative 3 %   Eosinophils  Absolute 0.1 0.0 - 0.5 K/uL   Basophils Relative 1 %   Basophils Absolute 0.0 0.0 - 0.1 K/uL   WBC Morphology SMUDGE CELLS     Comment: DIFF. CONFIRMED BY SMEAR   RBC Morphology MORPHOLOGY UNREMARKABLE    Smear Review Normal platelet morphology     Comment: PLATELETS APPEAR DECREASED   Immature Granulocytes 1 %   Abs Immature Granulocytes 0.01 0.00 - 0.07 K/uL    Comment: Performed at [facility], [address] [place], [place] [address]  CMP (Cancer Center only)     Status: Abnormal   Collection Time: 11/24/23 10:44 AM  Result Value Ref Range   Sodium 137 135 - 145 mmol/L   Potassium 4.1 3.5 - 5.1 mmol/L   Chloride 106 98 - 111 mmol/L   CO2 24 22 - 32 mmol/L   Glucose, Bld 90 70 - 99 mg/dL    Comment: Glucose reference range applies only to samples taken after fasting for at least 8 hours.   BUN 14 6 - 20 mg/dL   Creatinine 9.52 (H) 8.41 - 1.00 mg/dL   Calcium  8.6 (L) 8.9 - 10.3 mg/dL   Total Protein 6.3 (L) 6.5 - 8.1 g/dL   Albumin 3.6 3.5 - 5.0 g/dL   AST 15 15 - 41 U/L   ALT 17 0 - 44 U/L   Alkaline Phosphatase 80 38 - 126 U/L   Total Bilirubin 0.6 0.0 - 1.2 mg/dL   GFR, Estimated 54 (L) >60 mL/min    Comment: (NOTE) Calculated using the CKD-EPI Creatinine Equation (2021)    Anion gap 7 5 - 15    Comment: Performed at [facility], [address] [place], [place] [address]  CBC with Differential/Platelet     Status: Abnormal   Collection Time: 11/24/23 10:44 AM  Result Value Ref Range   WBC 2.2 (L) 4.0 - 10.5 K/uL   RBC 2.73 (L) 3.87 - 5.11 MIL/uL   Hemoglobin 9.7 (L) 12.0 - 15.0 g/dL   HCT 10.2 (L) 72.5 - 36.6 %   MCV 102.2 (H) 80.0 - 100.0 fL   MCH 35.5 (H) 26.0 - 34.0 pg   MCHC 34.8 30.0 - 36.0 g/dL   RDW 44.0 34.7 - 42.5 %   Platelets 55 (L) 150 - 400 K/uL   nRBC 0.0 0.0 - 0.2 %   Neutrophils Relative % 18 %   Neutro Abs 0.4 (LL) 1.7 - 7.7 K/uL    Comment: REPEATED TO VERIFY THIS CRITICAL RESULT HAS VERIFIED AND BEEN CALLED TO KENDRA YORK  BY KIM ROOS ON 03 10 2025 AT 1143, AND HAS BEEN READ BACK.     Lymphocytes Relative 71 %   Lymphs Abs 1.6 0.7 - 4.0 K/uL   Monocytes Relative 8 %   Monocytes Absolute 0.2 0.1 - 1.0 K/uL   Eosinophils Relative 2 %   Eosinophils  Absolute 0.1 0.0 - 0.5 K/uL   Basophils Relative 1 %   Basophils Absolute 0.0 0.0 - 0.1 K/uL   WBC Morphology      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   RBC Morphology RARE NRBCS NOTED ON SMEAR.    Smear Review Normal platelet morphology     Comment: PLATELETS APPEAR DECREASED   Immature Granulocytes 0 %   Abs Immature Granulocytes 0.00 0.00 - 0.07 K/uL    Comment: Performed at [facility], [address] [name], [place] [address]  Comprehensive metabolic panel     Status: Abnormal   Collection Time: 12/09/23 10:34 AM  Result Value Ref Range   Sodium 140 135 - 145 mmol/L   Potassium 4.2 3.5 - 5.1 mmol/L   Chloride 108 98 - 111 mmol/L   CO2 24 22 - 32 mmol/L   Glucose, Bld 94 70 - 99 mg/dL    Comment: Glucose reference range applies only to samples taken after fasting for at least 8 hours.   BUN 16 6 - 20 mg/dL   Creatinine, Ser 6.04 (H) 0.44 - 1.00 mg/dL   Calcium  9.0 8.9 - 10.3 mg/dL   Total Protein 6.5 6.5 - 8.1 g/dL   Albumin 3.7 3.5 - 5.0 g/dL   AST 17 15 - 41 U/L   ALT 17 0 - 44 U/L   Alkaline Phosphatase 96 38 - 126 U/L   Total Bilirubin 0.5 0.0 - 1.2 mg/dL   GFR, Estimated 59 (L) >60 mL/min    Comment: (NOTE) Calculated using the CKD-EPI Creatinine Equation (2021)    Anion gap 8 5 - 15    Comment: Performed at [facility], [address] [place], [place] [address]  CBC with Differential/Platelet     Status: Abnormal   Collection Time: 12/09/23 10:34 AM  Result Value Ref Range   WBC 2.8 (L) 4.0 - 10.5 K/uL   RBC 2.78 (L) 3.87 - 5.11 MIL/uL   Hemoglobin 10.0 (L) 12.0 - 15.0 g/dL   HCT 11.9 (L) 14.7 - 82.9 %   MCV 106.5 (H) 80.0 - 100.0 fL   MCH 36.0 (H) 26.0 - 34.0 pg   MCHC 33.8 30.0 -  36.0 g/dL   RDW 56.2 13.0 - 86.5 %   Platelets 80 (L) 150 - 400 K/uL   nRBC 0.0 0.0 - 0.2 %   Neutrophils Relative % 38 %   Neutro Abs 1.1 (L) 1.7 - 7.7 K/uL   Lymphocytes Relative 49 %   Lymphs Abs 1.4 0.7 - 4.0 K/uL   Monocytes Relative 10 %   Monocytes Absolute 0.3 0.1 - 1.0 K/uL   Eosinophils Relative 2 %   Eosinophils Absolute 0.1 0.0 - 0.5 K/uL   Basophils Relative 0 %   Basophils Absolute 0.0 0.0 - 0.1 K/uL   WBC Morphology      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   RBC Morphology MORPHOLOGY UNREMARKABLE    Smear Review Normal platelet morphology     Comment: PLATELETS APPEAR DECREASED   Immature Granulocytes 1 %   Abs Immature Granulocytes 0.03 0.00 - 0.07 K/uL    Comment: Performed at [facility], [address] [place], [place] [address]  CMP (Cancer Center only)     Status: Abnormal   Collection Time: 12/16/23  8:51 AM  Result Value Ref Range   Sodium 138 135 - 145 mmol/L   Potassium 3.9 3.5 - 5.1 mmol/L  Chloride 108 98 - 111 mmol/L   CO2 23 22 - 32 mmol/L   Glucose, Bld 96 70 - 99 mg/dL    Comment: Glucose reference range applies only to samples taken after fasting for at least 8 hours.   BUN 12 6 - 20 mg/dL   Creatinine 1.91 (H) 4.78 - 1.00 mg/dL   Calcium  8.6 (L) 8.9 - 10.3 mg/dL   Total Protein 6.1 (L) 6.5 - 8.1 g/dL   Albumin 3.8 3.5 - 5.0 g/dL   AST 17 15 - 41 U/L   ALT 14 0 - 44 U/L   Alkaline Phosphatase 93 38 - 126 U/L   Total Bilirubin 0.7 0.0 - 1.2 mg/dL   GFR, Estimated 55 (L) >60 mL/min    Comment: (NOTE) Calculated using the CKD-EPI Creatinine Equation (2021)    Anion gap 7 5 - 15    Comment: Performed at [facility], [address] [place], [place] [address]  CBC with Differential/Platelet     Status: Abnormal   Collection Time: 12/16/23  8:51 AM  Result Value Ref Range   WBC 4.5 4.0 - 10.5 K/uL   RBC 2.99 (L) 3.87 - 5.11 MIL/uL   Hemoglobin 10.7 (L) 12.0 - 15.0 g/dL   HCT 13.0 (L) 86.5  - 46.0 %   MCV 105.7 (H) 80.0 - 100.0 fL   MCH 35.8 (H) 26.0 - 34.0 pg   MCHC 33.9 30.0 - 36.0 g/dL   RDW 78.4 (H) 69.6 - 29.5 %   Platelets 74 (L) 150 - 400 K/uL   nRBC 0.0 0.0 - 0.2 %   Neutrophils Relative % 57 %   Neutro Abs 2.6 1.7 - 7.7 K/uL   Lymphocytes Relative 33 %   Lymphs Abs 1.5 0.7 - 4.0 K/uL   Monocytes Relative 8 %   Monocytes Absolute 0.3 0.1 - 1.0 K/uL   Eosinophils Relative 1 %   Eosinophils Absolute 0.1 0.0 - 0.5 K/uL   Basophils Relative 0 %   Basophils Absolute 0.0 0.0 - 0.1 K/uL   Immature Granulocytes 1 %   Abs Immature Granulocytes 0.04 0.00 - 0.07 K/uL    Comment: Performed at [facility], [address] [place], [place] [address]  CMP (Cancer Center only)     Status: Abnormal   Collection Time: 01/13/24  9:09 AM  Result Value Ref Range   Sodium 135 135 - 145 mmol/L   Potassium 4.1 3.5 - 5.1 mmol/L   Chloride 101 98 - 111 mmol/L   CO2 25 22 - 32 mmol/L   Glucose, Bld 76 70 - 99 mg/dL    Comment: Glucose reference range applies only to samples taken after fasting for at least 8 hours.   BUN 16 6 - 20 mg/dL   Creatinine 2.44 (H) 0.10 - 1.00 mg/dL   Calcium  9.4 8.9 - 10.3 mg/dL   Total Protein 6.1 (L) 6.5 - 8.1 g/dL   Albumin 4.0 3.5 - 5.0 g/dL   AST 23 15 - 41 U/L   ALT 24 0 - 44 U/L   Alkaline Phosphatase 90 38 - 126 U/L   Total Bilirubin 1.5 (H) 0.0 - 1.2 mg/dL   GFR, Estimated 45 (L) >60 mL/min    Comment: (NOTE) Calculated using the CKD-EPI Creatinine Equation (2021)    Anion gap 9 5 - 15    Comment: Performed at [facility], [address] [place], [place] [address]  CBC with Differential/Platelet     Status:  Abnormal   Collection Time: 01/13/24  9:09 AM  Result Value Ref Range   WBC 2.7 (L) 4.0 - 10.5 K/uL   RBC 3.35 (L) 3.87 - 5.11 MIL/uL   Hemoglobin 12.1 12.0 - 15.0 g/dL   HCT 16.1 09.6 - 04.5 %   MCV 108.1 (H) 80.0 - 100.0 fL   MCH 36.1 (H) 26.0 - 34.0 pg   MCHC 33.4 30.0 - 36.0 g/dL   RDW 40.9 81.1 - 91.4 %    Platelets 68 (L) 150 - 400 K/uL   nRBC 0.0 0.0 - 0.2 %   Neutrophils Relative % 34 %   Neutro Abs 0.9 (L) 1.7 - 7.7 K/uL   Lymphocytes Relative 54 %   Lymphs Abs 1.5 0.7 - 4.0 K/uL   Monocytes Relative 7 %   Monocytes Absolute 0.2 0.1 - 1.0 K/uL   Eosinophils Relative 3 %   Eosinophils Absolute 0.1 0.0 - 0.5 K/uL   Basophils Relative 1 %   Basophils Absolute 0.0 0.0 - 0.1 K/uL   WBC Morphology Mild Left Shift (1-5% metas, occ myelo)     Comment: SMUDGE CELLS   RBC Morphology UNREMARKABLE    Smear Review Normal platelet morphology     Comment: PLATELETS APPEAR DECREASED   Immature Granulocytes 1 %   Abs Immature Granulocytes 0.03 0.00 - 0.07 K/uL    Comment: Performed at [facility], [address] [place], [place] [address]  D-dimer, quantitative     Status: Abnormal   Collection Time: 01/21/24  9:35 AM  Result Value Ref Range   D-Dimer, Quant 0.86 (H) 0.00 - 0.50 ug/mL-FEU    Comment: (NOTE) At the manufacturer cut-off value of 0.5 g/mL FEU, this assay has a negative predictive value of 95-100%.This assay is intended for use in conjunction with a clinical pretest probability (PTP) assessment model to exclude pulmonary embolism (PE) and deep venous thrombosis (DVT) in outpatients suspected of PE or DVT. Results should be correlated with clinical presentation. Performed at [facility], [address] [place], [place] [address]      Assessment & Plan Chest wall pain Acute left anterior chest wall pain, likely muscular, exacerbated by coughing, sneezing, and certain movements. Differential includes costochondritis, but muscular pain is more likely. Pulmonary embolism unlikely due to pain nature and lack of shortness of breath. - Prescribed baclofen for muscle relaxation, to be taken six hours after morning tizanidine  dose. - Recommended topical anti-inflammatory medication such as Voltaren gel, Biofreeze, or Icy Hot for local application. - Advised against  excessive rubbing of the area to prevent further inflammation. - Instructed to monitor for shortness of breath and seek emergency care if it occurs.  Left shoulder impingement syndrome Chronic left shoulder pain with limited range of motion, suggestive of impingement syndrome. - Referred to orthopedic specialist for evaluation and management.  Meralgia paresthetica New onset right thigh pain and numbness, likely due to meralgia paresthetica. Neurologist ordered MRI of lumbar spine and EMG to rule out other causes. D-dimer slightly positive, but ultrasound ruled out DVT. - Follow up with neurologist for MRI and EMG results. - Avoid tight clothing around the thigh.  Chronic neuropathy with leg weakness Chronic neuropathy with associated leg weakness. Neuropathy contributes to balance issues, particularly when legs cramp. - Continue current pregabalin  regimen.  Restless leg syndrome Restless leg syndrome managed with nortriptyline  at night, starting at 10 mg and increasing to 20 mg as tolerated. - Continue nortriptyline  as prescribed.

## 2024-01-30 ENCOUNTER — Other Ambulatory Visit (HOSPITAL_COMMUNITY): Payer: Self-pay

## 2024-02-02 ENCOUNTER — Ambulatory Visit
Admission: RE | Admit: 2024-02-02 | Discharge: 2024-02-02 | Disposition: A | Discharge status: Home / Self Care | Source: Ambulatory Visit | Attending: Physician Assistant | Admitting: Physician Assistant

## 2024-02-02 ENCOUNTER — Other Ambulatory Visit (HOSPITAL_COMMUNITY): Payer: Self-pay

## 2024-02-02 ENCOUNTER — Other Ambulatory Visit: Payer: Self-pay

## 2024-02-02 ENCOUNTER — Encounter: Payer: Self-pay | Admitting: Oncology

## 2024-02-02 DIAGNOSIS — R2689 Other abnormalities of gait and mobility: Secondary | ICD-10-CM

## 2024-02-02 DIAGNOSIS — M48061 Spinal stenosis, lumbar region without neurogenic claudication: Secondary | ICD-10-CM | POA: Diagnosis not present

## 2024-02-02 DIAGNOSIS — G8929 Other chronic pain: Secondary | ICD-10-CM

## 2024-02-02 DIAGNOSIS — R200 Anesthesia of skin: Secondary | ICD-10-CM

## 2024-02-02 DIAGNOSIS — M5136 Other intervertebral disc degeneration, lumbar region with discogenic back pain only: Secondary | ICD-10-CM | POA: Diagnosis not present

## 2024-02-03 ENCOUNTER — Emergency Department
Admission: EM | Admit: 2024-02-03 | Discharge: 2024-02-03 | Discharge status: Against Medical Advice | Attending: Oncology | Admitting: Oncology

## 2024-02-03 ENCOUNTER — Other Ambulatory Visit: Payer: Self-pay

## 2024-02-03 ENCOUNTER — Ambulatory Visit
Admission: RE | Admit: 2024-02-03 | Discharge: 2024-02-03 | Disposition: A | Discharge status: Home / Self Care | Source: Ambulatory Visit | Attending: Oncology | Admitting: Oncology

## 2024-02-03 DIAGNOSIS — R1011 Right upper quadrant pain: Secondary | ICD-10-CM | POA: Diagnosis not present

## 2024-02-03 DIAGNOSIS — R079 Chest pain, unspecified: Secondary | ICD-10-CM | POA: Insufficient documentation

## 2024-02-03 DIAGNOSIS — Z5321 Procedure and treatment not carried out due to patient leaving prior to being seen by health care provider: Secondary | ICD-10-CM | POA: Insufficient documentation

## 2024-02-03 DIAGNOSIS — J90 Pleural effusion, not elsewhere classified: Secondary | ICD-10-CM | POA: Diagnosis not present

## 2024-02-03 DIAGNOSIS — C9110 Chronic lymphocytic leukemia of B-cell type not having achieved remission: Secondary | ICD-10-CM | POA: Diagnosis not present

## 2024-02-03 LAB — CBC
HCT: 36.5 % (ref 36.0–46.0)
Hemoglobin: 12.4 g/dL (ref 12.0–15.0)
MCH: 35.7 pg — ABNORMAL HIGH (ref 26.0–34.0)
MCHC: 34 g/dL (ref 30.0–36.0)
MCV: 105.2 fL — ABNORMAL HIGH (ref 80.0–100.0)
Platelets: 44 10*3/uL — ABNORMAL LOW (ref 150–400)
RBC: 3.47 MIL/uL — ABNORMAL LOW (ref 3.87–5.11)
RDW: 12.6 % (ref 11.5–15.5)
WBC: 3.3 10*3/uL — ABNORMAL LOW (ref 4.0–10.5)
nRBC: 0 % (ref 0.0–0.2)

## 2024-02-03 LAB — GLUCOSE, CAPILLARY: Glucose-Capillary: 108 mg/dL — ABNORMAL HIGH (ref 70–99)

## 2024-02-03 LAB — COMPREHENSIVE METABOLIC PANEL WITH GFR
ALT: 17 U/L (ref 0–44)
AST: 23 U/L (ref 15–41)
Albumin: 3.6 g/dL (ref 3.5–5.0)
Alkaline Phosphatase: 78 U/L (ref 38–126)
Anion gap: 13 (ref 5–15)
BUN: 16 mg/dL (ref 6–20)
CO2: 22 mmol/L (ref 22–32)
Calcium: 10 mg/dL (ref 8.9–10.3)
Chloride: 102 mmol/L (ref 98–111)
Creatinine, Ser: 1.7 mg/dL — ABNORMAL HIGH (ref 0.44–1.00)
GFR, Estimated: 35 mL/min — ABNORMAL LOW (ref >60)
Glucose, Bld: 147 mg/dL — ABNORMAL HIGH (ref 70–99)
Potassium: 3.2 mmol/L — ABNORMAL LOW (ref 3.5–5.1)
Sodium: 137 mmol/L (ref 135–145)
Total Bilirubin: 1.2 mg/dL (ref 0.0–1.2)
Total Protein: 5.9 g/dL — ABNORMAL LOW (ref 6.5–8.1)

## 2024-02-03 LAB — LIPASE, BLOOD: Lipase: 31 U/L (ref 11–51)

## 2024-02-03 MED ORDER — FLUDEOXYGLUCOSE F - 18 (FDG) INJECTION
9.4000 | Freq: Once | INTRAVENOUS | Status: AC | PRN
  Administered 2024-02-03: 9.54 via INTRAVENOUS

## 2024-02-03 NOTE — ED Triage Notes (Signed)
 Pt sts that she is having upper right quad/ chest area pain that been going on for the last week.

## 2024-02-05 ENCOUNTER — Encounter: Payer: Self-pay | Admitting: Oncology

## 2024-02-05 ENCOUNTER — Ambulatory Visit: Payer: Self-pay

## 2024-02-05 DIAGNOSIS — E10618 Type 1 diabetes mellitus with other diabetic arthropathy: Secondary | ICD-10-CM | POA: Diagnosis not present

## 2024-02-05 DIAGNOSIS — M7502 Adhesive capsulitis of left shoulder: Secondary | ICD-10-CM | POA: Diagnosis not present

## 2024-02-05 NOTE — Telephone Encounter (Signed)
  Chief Complaint: Rib pain on right side Symptoms: pain with specific activities such as coughing, sneezing, sniffing, lying down Frequency: 1 week Pertinent Negatives: Patient denies chest pain, fever, difficulty breathing Disposition: [] ED /[] Urgent Care (no appt availability in office) / [x] Appointment(In office/virtual)/ []  [facility]/ [] Home Care/ [] Refused Recommended Disposition /[] [facility]/ []  Follow-up with PCP Additional Notes: Patient called in reporting rib pain on the right side, stating it hurts when she coughs, sneezes, sniffs, lies down, bends over. Patient states she has to hold the area firmly for the pain to subside while she is doing specific activities/movement. Patient denies known injury or cause. Patient denies chest pain, difficulty breathing, shortness of breath, fever. Patient advised on when to call 911. Patient appt made for tomorrow for further evaluation.   Copied from CRM [id number]. Topic: Clinical - Red Word Triage >> Feb 05, 2024  3:54 PM Crispin Dolphin wrote: Red Word that prompted transfer to Nurse Triage: breast bone feels likes something broke and is moving. Very painful when moving coughing or sneezing Reason for Disposition  [1] MODERATE pain (e.g., interferes with normal activities) AND [2] comes and goes (cramps) AND [3] present > 24 hours  (Exception: Pain with Vomiting or Diarrhea - see that Guideline.)  Answer Assessment - Initial Assessment Questions 1. LOCATION: "Where does it hurt?"      Right side rib 2. RADIATION: "Does the pain shoot anywhere else?" (e.g., chest, back)     No 3. ONSET: "When did the pain begin?" (e.g., minutes, hours or days ago)      A week 4. SUDDEN: "Gradual or sudden onset?"     A sudden onset 5. PATTERN "Does the pain come and go, or is it constant?"    - If it comes and goes: "How long does it last?" "Do you have pain now?"     (Note: Comes and goes means the pain is intermittent. It goes away  completely between bouts.)    - If constant: "Is it getting better, staying the same, or getting worse?"      (Note: Constant means the pain never goes away completely; most serious pain is constant and gets worse.)      If sitting completely still, patient is fine 6. SEVERITY: "How bad is the pain?"  (e.g., Scale 1-10; mild, moderate, or severe)    - MILD (1-3): Doesn't interfere with normal activities, abdomen soft and not tender to touch..     - MODERATE (4-7): Interferes with normal activities or awakens from sleep, abdomen tender to touch.     - SEVERE (8-10): Excruciating pain, doubled over, unable to do any normal activities.       Ranges depending on activity 8. AGGRAVATING FACTORS: "Does anything seem to cause this pain?" (e.g., foods, stress, alcohol)     Coughing, sneezing, sniffing, lying down, movement 9. CARDIAC SYMPTOMS: "Do you have any of the following symptoms: chest pain, difficulty breathing, sweating, nausea?"        No 10. OTHER SYMPTOMS: "Do you have any other symptoms?" (e.g., back pain, diarrhea, fever, urination pain, vomiting)       No  Protocols used: Abdominal Pain - Upper-A-AH

## 2024-02-06 ENCOUNTER — Ambulatory Visit
Admission: RE | Admit: 2024-02-06 | Discharge: 2024-02-06 | Disposition: A | Discharge status: Home / Self Care | Attending: Family Medicine | Admitting: Family Medicine

## 2024-02-06 ENCOUNTER — Ambulatory Visit: Admitting: Family Medicine

## 2024-02-06 ENCOUNTER — Encounter: Payer: Self-pay | Admitting: Family Medicine

## 2024-02-06 ENCOUNTER — Other Ambulatory Visit (HOSPITAL_COMMUNITY): Payer: Self-pay

## 2024-02-06 ENCOUNTER — Ambulatory Visit
Admission: RE | Admit: 2024-02-06 | Discharge: 2024-02-06 | Disposition: A | Discharge status: Home / Self Care | Source: Ambulatory Visit | Attending: Family Medicine | Admitting: Family Medicine

## 2024-02-06 ENCOUNTER — Other Ambulatory Visit: Payer: Self-pay | Admitting: Family Medicine

## 2024-02-06 VITALS — BP 102/64 | HR 90 | Resp 16 | Ht 67.0 in | Wt 179.8 lb

## 2024-02-06 DIAGNOSIS — R0781 Pleurodynia: Secondary | ICD-10-CM | POA: Diagnosis not present

## 2024-02-06 DIAGNOSIS — R0789 Other chest pain: Secondary | ICD-10-CM

## 2024-02-06 MED ORDER — LIDOCAINE 5 % EX PTCH: 1.0000 | MEDICATED_PATCH | CUTANEOUS | 0 refills | Status: DC

## 2024-02-06 NOTE — Progress Notes (Signed)
 Name: Monique [name]   MRN: [id number]    DOB: 1971-02-22   Date:02/06/2024       Progress Note  Subjective  Chief Complaint  Chief Complaint  Patient presents with   Chest Pain    Rib pain on right side, pain with specific activities such as coughing, sneezing, sniffing, lying down, pt states feels like if something broken Onset for 1 week     Discussed the use of AI scribe software for clinical note transcription with the patient, who gave verbal consent to proceed.  History of Present Illness Monique [name] is a 53 year old female who presents with right-sided rib cage pain.  She experiences significant sharp pain on the right side of her rib cage, extending from the lower rib cage downwards. The pain is described as feeling like 'a bone is cutting into me or something is stabbing into me.' It is exacerbated by movements such as coughing, standing up, and lying down. The pain began approximately two days after her last visit, which was about a week ago.  There is no history of recent trauma or bumping into anything that could have caused the pain. The pain is not associated with food intake, and she does not experience shortness of breath. She woke up at 1 AM in significant pain after falling asleep in her chair and finds it difficult to get in and out of bed due to the pain. No nausea or shoulder pain is present.  Her current medications include Lyrica  and a muscle relaxer. She has not used Lidoderm  patches before. She can take Tylenol  for pain management but cannot take NSAIDs. She confirms significant soreness upon touch in the affected area.    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 06/05/2021   Lumbosacral radiculopathy/radiculitis at L5 (Bilateral) 06/05/2021   PAD (peripheral artery disease) (HCC) 02/14/2021   CIN II (cervical intraepithelial neoplasia II) 02/01/2021   S/P vaginal hysterectomy 01/22/2021   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/09/2020   Bilateral leg pain 11/03/2019   Iron  deficiency anemia 10/15/2019   Bilateral arm pain 08/05/2019   Bilateral hand numbness 08/05/2019   Weakness of both hands 08/05/2019   Chronic musculoskeletal pain 07/19/2019   Chronic neuropathic pain 07/19/2019   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   Special screening for malignant neoplasms, colon    Polyp of sigmoid colon    Hemorrhoids    Trigeminal neuralgia 10/05/2018   Lumbar radiculitis (Right) 09/24/2018   Hypocalcemia 06/03/2018   Vitamin D  insufficiency 06/03/2018   Abnormal MRI, lumbar spine (06/02/2020) 06/03/2018   Chronic hip pain (Right) 06/03/2018   Spondylosis without myelopathy or radiculopathy, lumbar region 06/03/2018   DDD (degenerative disc disease), lumbar 06/02/2018   Lumbar facet hypertrophy 06/02/2018   Lumbar facet arthropathy 06/02/2018   Lumbar facet syndrome (Bilateral) (R>L) 06/02/2018   Marijuana use 05/19/2018   Chronic lower extremity pain (1ry area of Pain) (Bilateral) (R>L) 05/13/2018   Chronic low back pain (2ry area of Pain) (Bilateral) (R>L) w/ sciatica (Bilateral) 05/13/2018   Chronic pain syndrome 05/13/2018   Disorder of skeletal system 05/13/2018  Pharmacologic therapy 05/13/2018   Problems influencing health status 05/13/2018   Mastalgia 05/05/2018   Breast mass, right 01/07/2018   Face pain 12/11/2017   Tingling 12/11/2017   Thoracic aortic atherosclerosis (HCC) 08/20/2017   Emphysema of lung (HCC) 08/20/2017   Adductor tendinitis 04/23/2017   Trochanteric bursitis of right hip 04/23/2017    GERD without esophagitis 12/11/2016   CLL (chronic lymphocytic leukemia) (HCC) 11/24/2016   Stress incontinence 09/04/2016   B12 deficiency 07/10/2015   Insomnia 07/10/2015   Anorexia nervosa, restricting type 07/10/2015   Anxiety, generalized 07/10/2015   H/O suicide attempt 07/10/2015   Lymphocytosis 07/10/2015   Obsessive-compulsive disorder 07/10/2015   Tobacco use 07/10/2015   History of cervical dysplasia 07/18/2014    Social History   Tobacco Use   Smoking status: Every Day    Types: E-cigarettes   Smokeless tobacco: Never  Substance Use Topics   Alcohol use: Not Currently    Alcohol/week: 0.0 standard drinks of alcohol    Comment: rarely     Current Outpatient Medications:    albuterol  (VENTOLIN  HFA) 108 (90 Base) MCG/ACT inhaler, Inhale 2 puffs into the lungs every 6 (six) hours as needed for wheezing or shortness of breath., Disp: 8 g, Rfl: 0   ARIPiprazole  (ABILIFY ) 5 MG tablet, Take 1 tablet (5 mg total) by mouth daily., Disp: 90 tablet, Rfl: 1   ASPIRIN  81 PO, Take 81 mg by mouth daily., Disp: , Rfl:    atorvastatin  (LIPITOR) 10 MG tablet, Take 1 tablet (10 mg total) by mouth daily., Disp: 100 tablet, Rfl: 3   baclofen  (LIORESAL ) 10 MG tablet, Take 1 tablet (10 mg total) by mouth 2 (two) times daily. Take 6 hours after morning tizanidine  dose, Disp: 30 each, Rfl: 0   Ca Phosphate-Cholecalciferol (CALTRATE GUMMY BITES) 250-10 MG-MCG CHEW, Chew 1 gummy by mouth 2 (two) times daily., Disp: 180 tablet, Rfl: 3   estrogens , conjugated, (PREMARIN ) 0.625 MG tablet, Take 1 tablet (0.625 mg total) by mouth daily for 21 days, then do not take for 7 days., Disp: 90 tablet, Rfl: 0   famotidine  (PEPCID ) 10 MG tablet, Take by mouth., Disp: , Rfl:    lenalidomide  (REVLIMID ) 10 MG capsule, Take 1 capsule (10 mg total) by mouth daily., Disp: 28 capsule, Rfl: 0   levocetirizine (XYZAL ) 5 MG tablet, Take 1 tablet (5 mg total) by mouth every evening., Disp: 30 tablet, Rfl: 1    loratadine  (CLARITIN ) 10 MG tablet, Take 1 tablet (10 mg total) by mouth daily in the morning., Disp: 90 tablet, Rfl: 1   nortriptyline  (PAMELOR ) 10 MG capsule, Take 1 tablet (10 mg total) by mouth at night for one week then increase to 20 mg at night, Disp: 60 capsule, Rfl: 3   omeprazole  (PRILOSEC) 20 MG capsule, Take 1 capsule (20 mg total) by mouth in the morning., Disp: 30 capsule, Rfl: 4   pregabalin  (LYRICA ) 150 MG capsule, Take 1 capsule (150 mg total) by mouth 3 (three) times daily., Disp: 90 capsule, Rfl: 1   tiZANidine  (ZANAFLEX ) 2 MG tablet, Take 1 tablet (2 mg total) by mouth at bedtime., Disp: 90 tablet, Rfl: 1   umeclidinium-vilanterol (ANORO ELLIPTA ) 62.5-25 MCG/ACT AEPB, Inhale 1 puff into the lungs daily., Disp: 180 each, Rfl: 1   vitamin B-12 (CYANOCOBALAMIN ) 500 MCG tablet, Take 250 mcg by mouth every morning., Disp: , Rfl:   Allergies  Allergen Reactions   Doxycycline  Rash    Rash on cheeks and sides of nose  Duloxetine  Rash    ROS  Ten systems reviewed and is negative except as mentioned in HPI    Objective  Vitals:   02/06/24 1131  BP: 102/64  Pulse: 90  Resp: 16  SpO2: 100%  Weight: 179 lb 12.8 oz (81.6 kg)  Height: 5\' 7"  (1.702 m)    Body mass index is 28.16 kg/m.   Physical Exam CONSTITUTIONAL: Patient appears well-developed and well-nourished. No distress. HEENT: Head atraumatic, normocephalic, neck supple. CARDIOVASCULAR: Normal rate, regular rhythm and normal heart sounds. No murmur heard. No BLE edema. PULMONARY: Effort normal and breath sounds normal. Lungs clear to auscultation bilaterally. Right lower rib cage tender to palpation. No respiratory distress. ABDOMINAL: Abdomen normal to inspection. Abdomen non-tender to palpation.  MUSCULOSKELETAL:pain during palpation of right anterior lower rib cage, no bruising  PSYCHIATRIC: Patient has a normal mood and affect. Behavior is normal. Judgment and thought content normal.  Recent Results  (from the past 2160 hours)  Rad Onc Aria Session Summary     Status: None   Collection Time: 11/10/23  9:17 AM  Result Value Ref Range   Course ID [email or web address]    Course Intent Unknown    Course Start Date 10/16/2023  2:18 PM    Session Number 10    Course First Treatment Date 10/27/2023 11:12 AM    Course Last Treatment Date 11/10/2023  9:15 AM    Course Elapsed Days 14    Reference Point ID Abd dp    Reference Point Dosage Given to Date 20 Gy   Reference Point Session Dosage Given 2 Gy   Plan ID Abd    Plan Fractions Treated to Date 10    Plan Total Fractions Prescribed 15    Plan Prescribed Dose Per Fraction 2 Gy   Plan Total Prescribed Dose 30.000000 Gy   Plan Primary Reference Point Abd dp   Rad Onc Aria Session Summary     Status: None   Collection Time: 11/11/23  9:06 AM  Result Value Ref Range   Course ID [email or web address]    Course Intent Unknown    Course Start Date 10/16/2023  2:18 PM    Session Number 11    Course First Treatment Date 10/27/2023 11:12 AM    Course Last Treatment Date 11/11/2023  9:05 AM    Course Elapsed Days 15    Reference Point ID Abd dp    Reference Point Dosage Given to Date 22 Gy   Reference Point Session Dosage Given 2 Gy   Plan ID Abd    Plan Fractions Treated to Date 11    Plan Total Fractions Prescribed 15    Plan Prescribed Dose Per Fraction 2 Gy   Plan Total Prescribed Dose 30.000000 Gy   Plan Primary Reference Point Abd dp   Cervicovaginal ancillary only     Status: None   Collection Time: 11/11/23 10:41 AM  Result Value Ref Range   Neisseria Gonorrhea Negative    Chlamydia Negative    Trichomonas Negative    Bacterial Vaginitis (gardnerella) Negative    Candida Vaginitis Negative    Candida Glabrata Negative    Comment      Normal Reference Range Bacterial Vaginosis - Negative   Comment Normal Reference Ranger Chlamydia - Negative    Comment      Normal Reference Range Neisseria Gonorrhea - Negative   Comment Normal Reference Range Candida  Species - Negative    Comment Normal Reference Range Candida Galbrata - Negative  Comment Normal Reference Range Trichomonas - Negative   POCT Urinalysis Dipstick     Status: Abnormal   Collection Time: 11/11/23 10:46 AM  Result Value Ref Range   Color, UA drk yellow    Clarity, UA clear    Glucose, UA Negative Negative   Bilirubin, UA neg    Ketones, UA neg    Spec Grav, UA 1.020 1.010 - 1.025   Blood, UA neg    pH, UA 6.0 5.0 - 8.0   Protein, UA Negative Negative   Urobilinogen, UA 0.2 0.2 or 1.0 E.U./dL   Nitrite, UA pos    Leukocytes, UA Large (3+) (A) Negative   Appearance clear    Odor normal   Urine Culture     Status: Abnormal   Collection Time: 11/11/23 10:53 AM   Specimen: Urine  Result Value Ref Range   MICRO NUMBER: [id number]    SPECIMEN QUALITY: Adequate    Sample Source URINE    STATUS: FINAL    ISOLATE 1: Escherichia coli (A)     Comment: 50,000-100,000 CFU/mL of Escherichia coli      Susceptibility   Escherichia coli - URINE CULTURE, REFLEX    AMOX/CLAVULANIC <=2 Sensitive     AMPICILLIN <=2 Sensitive     AMPICILLIN/SULBACTAM <=2 Sensitive     CEFAZOLIN * <=4 Not Reportable      * For infections other than uncomplicated UTI caused by E. coli, K. pneumoniae or P. mirabilis: Cefazolin  is resistant if MIC > or = 8 mcg/mL. (Distinguishing susceptible versus intermediate for isolates with MIC < or = 4 mcg/mL requires additional testing.) For uncomplicated UTI caused by E. coli, K. pneumoniae or P. mirabilis: Cefazolin  is susceptible if MIC <32 mcg/mL and predicts susceptible to the oral agents cefaclor, cefdinir, cefpodoxime, cefprozil, cefuroxime, cephalexin  and loracarbef.     CEFTAZIDIME <=1 Sensitive     CEFEPIME <=1 Sensitive     CEFTRIAXONE <=1 Sensitive     CIPROFLOXACIN  <=0.25 Sensitive     LEVOFLOXACIN  <=0.12 Sensitive     GENTAMICIN <=1 Sensitive     IMIPENEM <=0.25 Sensitive     NITROFURANTOIN  <=16 Sensitive     PIP/TAZO <=4 Sensitive      TOBRAMYCIN <=1 Sensitive     TRIMETH /SULFA * <=20 Sensitive      * For infections other than uncomplicated UTI caused by E. coli, K. pneumoniae or P. mirabilis: Cefazolin  is resistant if MIC > or = 8 mcg/mL. (Distinguishing susceptible versus intermediate for isolates with MIC < or = 4 mcg/mL requires additional testing.) For uncomplicated UTI caused by E. coli, K. pneumoniae or P. mirabilis: Cefazolin  is susceptible if MIC <32 mcg/mL and predicts susceptible to the oral agents cefaclor, cefdinir, cefpodoxime, cefprozil, cefuroxime, cephalexin  and loracarbef. Legend: S = Susceptible  I = Intermediate R = Resistant  NS = Not susceptible SDD = Susceptible Dose Dependent * = Not Tested  NR = Not Reported **NN = See Therapy Comments   Rad Onc Aria Session Summary     Status: None   Collection Time: 11/12/23  8:26 AM  Result Value Ref Range   Course ID [email or web address]    Course Intent Unknown    Course Start Date 10/16/2023  2:18 PM    Session Number 12    Course First Treatment Date 10/27/2023 11:12 AM    Course Last Treatment Date 11/12/2023  8:24 AM    Course Elapsed Days 16    Reference Point ID Abd dp    Reference Point Dosage  Given to Date 24 Gy   Reference Point Session Dosage Given 2 Gy   Plan ID Abd    Plan Fractions Treated to Date 12    Plan Total Fractions Prescribed 15    Plan Prescribed Dose Per Fraction 2 Gy   Plan Total Prescribed Dose 30.000000 Gy   Plan Primary Reference Point Abd dp   Rad Onc Aria Session Summary     Status: None   Collection Time: 11/13/23  8:10 AM  Result Value Ref Range   Course ID [email or web address]    Course Intent Unknown    Course Start Date 10/16/2023  2:18 PM    Session Number 13    Course First Treatment Date 10/27/2023 11:12 AM    Course Last Treatment Date 11/13/2023  8:09 AM    Course Elapsed Days 17    Reference Point ID Abd dp    Reference Point Dosage Given to Date 26 Gy   Reference Point Session Dosage Given 2 Gy   Plan ID Abd    Plan  Fractions Treated to Date 13    Plan Total Fractions Prescribed 15    Plan Prescribed Dose Per Fraction 2 Gy   Plan Total Prescribed Dose 30.000000 Gy   Plan Primary Reference Point Abd dp   Rad Onc Aria Session Summary     Status: None   Collection Time: 11/14/23  8:15 AM  Result Value Ref Range   Course ID [email or web address]    Course Intent Unknown    Course Start Date 10/16/2023  2:18 PM    Session Number 14    Course First Treatment Date 10/27/2023 11:12 AM    Course Last Treatment Date 11/14/2023  8:14 AM    Course Elapsed Days 18    Reference Point ID Abd dp    Reference Point Dosage Given to Date 28 Gy   Reference Point Session Dosage Given 2 Gy   Plan ID Abd    Plan Fractions Treated to Date 14    Plan Total Fractions Prescribed 15    Plan Prescribed Dose Per Fraction 2 Gy   Plan Total Prescribed Dose 30.000000 Gy   Plan Primary Reference Point Abd dp   Rad Onc Aria Session Summary     Status: None   Collection Time: 11/17/23  9:36 AM  Result Value Ref Range   Course ID [email or web address]    Course Intent Unknown    Course Start Date 10/16/2023  2:18 PM    Session Number 15    Course First Treatment Date 10/27/2023 11:12 AM    Course Last Treatment Date 11/17/2023  9:34 AM    Course Elapsed Days 21    Reference Point ID Abd dp    Reference Point Dosage Given to Date 30 Gy   Reference Point Session Dosage Given 2 Gy   Plan ID Abd    Plan Fractions Treated to Date 15    Plan Total Fractions Prescribed 15    Plan Prescribed Dose Per Fraction 2 Gy   Plan Total Prescribed Dose 30.000000 Gy   Plan Primary Reference Point Abd dp   CMP (Cancer Center only)     Status: Abnormal   Collection Time: 11/17/23 10:07 AM  Result Value Ref Range   Sodium 135 135 - 145 mmol/L   Potassium 3.8 3.5 - 5.1 mmol/L   Chloride 101 98 - 111 mmol/L   CO2 24 22 - 32 mmol/L   Glucose, Bld 82 70 - 99 mg/dL  Comment: Glucose reference range applies only to samples taken after fasting for at least 8 hours.   BUN 15 6  - 20 mg/dL   Creatinine 1.61 (H) 0.96 - 1.00 mg/dL   Calcium  8.8 (L) 8.9 - 10.3 mg/dL   Total Protein 6.0 (L) 6.5 - 8.1 g/dL   Albumin 3.8 3.5 - 5.0 g/dL   AST 15 15 - 41 U/L   ALT 17 0 - 44 U/L   Alkaline Phosphatase 77 38 - 126 U/L   Total Bilirubin 1.1 0.0 - 1.2 mg/dL   GFR, Estimated 52 (L) >60 mL/min    Comment: (NOTE) Calculated using the CKD-EPI Creatinine Equation (2021)    Anion gap 10 5 - 15    Comment: Performed at [facility], [address] [place], [place] [address]  CBC with Differential/Platelet     Status: Abnormal   Collection Time: 11/17/23 10:08 AM  Result Value Ref Range   WBC 1.9 (L) 4.0 - 10.5 K/uL   RBC 2.94 (L) 3.87 - 5.11 MIL/uL   Hemoglobin 10.5 (L) 12.0 - 15.0 g/dL   HCT 98.1 (L) 19.1 - 47.8 %   MCV 102.7 (H) 80.0 - 100.0 fL   MCH 35.7 (H) 26.0 - 34.0 pg   MCHC 34.8 30.0 - 36.0 g/dL   RDW 29.5 62.1 - 30.8 %   Platelets 37 (L) 150 - 400 K/uL    Comment: Immature Platelet Fraction may be clinically indicated, consider ordering this additional test [id number]    nRBC 0.0 0.0 - 0.2 %   Neutrophils Relative % 23 %   Neutro Abs 0.5 (L) 1.7 - 7.7 K/uL   Lymphocytes Relative 60 %   Lymphs Abs 1.2 0.7 - 4.0 K/uL   Monocytes Relative 12 %   Monocytes Absolute 0.2 0.1 - 1.0 K/uL   Eosinophils Relative 3 %   Eosinophils Absolute 0.1 0.0 - 0.5 K/uL   Basophils Relative 1 %   Basophils Absolute 0.0 0.0 - 0.1 K/uL   WBC Morphology SMUDGE CELLS     Comment: DIFF. CONFIRMED BY SMEAR   RBC Morphology MORPHOLOGY UNREMARKABLE    Smear Review Normal platelet morphology     Comment: PLATELETS APPEAR DECREASED   Immature Granulocytes 1 %   Abs Immature Granulocytes 0.01 0.00 - 0.07 K/uL    Comment: Performed at [facility], [address] [place], [place] [address]  CMP (Cancer Center only)     Status: Abnormal   Collection Time: 11/24/23 10:44 AM  Result Value Ref Range   Sodium 137 135 - 145 mmol/L   Potassium 4.1 3.5 - 5.1 mmol/L    Chloride 106 98 - 111 mmol/L   CO2 24 22 - 32 mmol/L   Glucose, Bld 90 70 - 99 mg/dL    Comment: Glucose reference range applies only to samples taken after fasting for at least 8 hours.   BUN 14 6 - 20 mg/dL   Creatinine 4.13 (H) 2.44 - 1.00 mg/dL   Calcium  8.6 (L) 8.9 - 10.3 mg/dL   Total Protein 6.3 (L) 6.5 - 8.1 g/dL   Albumin 3.6 3.5 - 5.0 g/dL   AST 15 15 - 41 U/L   ALT 17 0 - 44 U/L   Alkaline Phosphatase 80 38 - 126 U/L   Total Bilirubin 0.6 0.0 - 1.2 mg/dL   GFR, Estimated 54 (L) >60 mL/min    Comment: (NOTE) Calculated using the CKD-EPI Creatinine Equation (2021)    Anion gap  7 5 - 15    Comment: Performed at [facility], [address] [place], [place] [address]  CBC with Differential/Platelet     Status: Abnormal   Collection Time: 11/24/23 10:44 AM  Result Value Ref Range   WBC 2.2 (L) 4.0 - 10.5 K/uL   RBC 2.73 (L) 3.87 - 5.11 MIL/uL   Hemoglobin 9.7 (L) 12.0 - 15.0 g/dL   HCT 60.4 (L) 54.0 - 98.1 %   MCV 102.2 (H) 80.0 - 100.0 fL   MCH 35.5 (H) 26.0 - 34.0 pg   MCHC 34.8 30.0 - 36.0 g/dL   RDW 19.1 47.8 - 29.5 %   Platelets 55 (L) 150 - 400 K/uL   nRBC 0.0 0.0 - 0.2 %   Neutrophils Relative % 18 %   Neutro Abs 0.4 (LL) 1.7 - 7.7 K/uL    Comment: REPEATED TO VERIFY THIS CRITICAL RESULT HAS VERIFIED AND BEEN CALLED TO KENDRA YORK BY KIM ROOS ON 03 10 2025 AT 1143, AND HAS BEEN READ BACK.     Lymphocytes Relative 71 %   Lymphs Abs 1.6 0.7 - 4.0 K/uL   Monocytes Relative 8 %   Monocytes Absolute 0.2 0.1 - 1.0 K/uL   Eosinophils Relative 2 %   Eosinophils Absolute 0.1 0.0 - 0.5 K/uL   Basophils Relative 1 %   Basophils Absolute 0.0 0.0 - 0.1 K/uL   WBC Morphology      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   RBC Morphology RARE NRBCS NOTED ON SMEAR.    Smear Review Normal platelet morphology     Comment: PLATELETS APPEAR DECREASED   Immature Granulocytes 0 %   Abs Immature Granulocytes 0.00 0.00 - 0.07 K/uL     Comment: Performed at [facility], [address] [place], [place] [address]  Comprehensive metabolic panel     Status: Abnormal   Collection Time: 12/09/23 10:34 AM  Result Value Ref Range   Sodium 140 135 - 145 mmol/L   Potassium 4.2 3.5 - 5.1 mmol/L   Chloride 108 98 - 111 mmol/L   CO2 24 22 - 32 mmol/L   Glucose, Bld 94 70 - 99 mg/dL    Comment: Glucose reference range applies only to samples taken after fasting for at least 8 hours.   BUN 16 6 - 20 mg/dL   Creatinine, Ser 8.65 (H) 0.44 - 1.00 mg/dL   Calcium  9.0 8.9 - 10.3 mg/dL   Total Protein 6.5 6.5 - 8.1 g/dL   Albumin 3.7 3.5 - 5.0 g/dL   AST 17 15 - 41 U/L   ALT 17 0 - 44 U/L   Alkaline Phosphatase 96 38 - 126 U/L   Total Bilirubin 0.5 0.0 - 1.2 mg/dL   GFR, Estimated 59 (L) >60 mL/min    Comment: (NOTE) Calculated using the CKD-EPI Creatinine Equation (2021)    Anion gap 8 5 - 15    Comment: Performed at [facility], [address] [place], [place] [address]  CBC with Differential/Platelet     Status: Abnormal   Collection Time: 12/09/23 10:34 AM  Result Value Ref Range   WBC 2.8 (L) 4.0 - 10.5 K/uL   RBC 2.78 (L) 3.87 - 5.11 MIL/uL   Hemoglobin 10.0 (L) 12.0 - 15.0 g/dL   HCT 62.9 (L) 52.8 - 41.3 %   MCV 106.5 (H) 80.0 - 100.0 fL   MCH 36.0 (H) 26.0 - 34.0 pg   MCHC 33.8  30.0 - 36.0 g/dL   RDW 13.0 86.5 - 78.4 %   Platelets 80 (L) 150 - 400 K/uL   nRBC 0.0 0.0 - 0.2 %   Neutrophils Relative % 38 %   Neutro Abs 1.1 (L) 1.7 - 7.7 K/uL   Lymphocytes Relative 49 %   Lymphs Abs 1.4 0.7 - 4.0 K/uL   Monocytes Relative 10 %   Monocytes Absolute 0.3 0.1 - 1.0 K/uL   Eosinophils Relative 2 %   Eosinophils Absolute 0.1 0.0 - 0.5 K/uL   Basophils Relative 0 %   Basophils Absolute 0.0 0.0 - 0.1 K/uL   WBC Morphology      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   RBC Morphology MORPHOLOGY UNREMARKABLE    Smear Review Normal platelet morphology     Comment: PLATELETS  APPEAR DECREASED   Immature Granulocytes 1 %   Abs Immature Granulocytes 0.03 0.00 - 0.07 K/uL    Comment: Performed at [facility], [address] [place], [place] [address]  CMP (Cancer Center only)     Status: Abnormal   Collection Time: 12/16/23  8:51 AM  Result Value Ref Range   Sodium 138 135 - 145 mmol/L   Potassium 3.9 3.5 - 5.1 mmol/L   Chloride 108 98 - 111 mmol/L   CO2 23 22 - 32 mmol/L   Glucose, Bld 96 70 - 99 mg/dL    Comment: Glucose reference range applies only to samples taken after fasting for at least 8 hours.   BUN 12 6 - 20 mg/dL   Creatinine 5.28 (H) 4.13 - 1.00 mg/dL   Calcium  8.6 (L) 8.9 - 10.3 mg/dL   Total Protein 6.1 (L) 6.5 - 8.1 g/dL   Albumin 3.8 3.5 - 5.0 g/dL   AST 17 15 - 41 U/L   ALT 14 0 - 44 U/L   Alkaline Phosphatase 93 38 - 126 U/L   Total Bilirubin 0.7 0.0 - 1.2 mg/dL   GFR, Estimated 55 (L) >60 mL/min    Comment: (NOTE) Calculated using the CKD-EPI Creatinine Equation (2021)    Anion gap 7 5 - 15    Comment: Performed at [facility], [address] [place], [place] [address]  CBC with Differential/Platelet     Status: Abnormal   Collection Time: 12/16/23  8:51 AM  Result Value Ref Range   WBC 4.5 4.0 - 10.5 K/uL   RBC 2.99 (L) 3.87 - 5.11 MIL/uL   Hemoglobin 10.7 (L) 12.0 - 15.0 g/dL   HCT 02.7 (L) 25.3 - 66.4 %   MCV 105.7 (H) 80.0 - 100.0 fL   MCH 35.8 (H) 26.0 - 34.0 pg   MCHC 33.9 30.0 - 36.0 g/dL   RDW 40.3 (H) 47.4 - 25.9 %   Platelets 74 (L) 150 - 400 K/uL   nRBC 0.0 0.0 - 0.2 %   Neutrophils Relative % 57 %   Neutro Abs 2.6 1.7 - 7.7 K/uL   Lymphocytes Relative 33 %   Lymphs Abs 1.5 0.7 - 4.0 K/uL   Monocytes Relative 8 %   Monocytes Absolute 0.3 0.1 - 1.0 K/uL   Eosinophils Relative 1 %   Eosinophils Absolute 0.1 0.0 - 0.5 K/uL   Basophils Relative 0 %   Basophils Absolute 0.0 0.0 - 0.1 K/uL   Immature Granulocytes 1 %   Abs Immature Granulocytes 0.04 0.00 - 0.07 K/uL    Comment: Performed at [facility], [address] [place], [place] [address]  CMP (Cancer Center only)     Status: Abnormal   Collection Time: 01/13/24  9:09 AM  Result Value Ref Range   Sodium 135 135 - 145 mmol/L   Potassium 4.1 3.5 - 5.1 mmol/L   Chloride 101 98 - 111 mmol/L   CO2 25 22 - 32 mmol/L   Glucose, Bld 76 70 - 99 mg/dL    Comment: Glucose reference range applies only to samples taken after fasting for at least 8 hours.   BUN 16 6 - 20 mg/dL   Creatinine 4.69 (H) 6.29 - 1.00 mg/dL   Calcium  9.4 8.9 - 10.3 mg/dL   Total Protein 6.1 (L) 6.5 - 8.1 g/dL   Albumin 4.0 3.5 - 5.0 g/dL   AST 23 15 - 41 U/L   ALT 24 0 - 44 U/L   Alkaline Phosphatase 90 38 - 126 U/L   Total Bilirubin 1.5 (H) 0.0 - 1.2 mg/dL   GFR, Estimated 45 (L) >60 mL/min    Comment: (NOTE) Calculated using the CKD-EPI Creatinine Equation (2021)    Anion gap 9 5 - 15    Comment: Performed at [facility], [address] [place], [place] [address]  CBC with Differential/Platelet     Status: Abnormal   Collection Time: 01/13/24  9:09 AM  Result Value Ref Range   WBC 2.7 (L) 4.0 - 10.5 K/uL   RBC 3.35 (L) 3.87 - 5.11 MIL/uL   Hemoglobin 12.1 12.0 - 15.0 g/dL   HCT 32.4 40.1 - 02.7 %   MCV 108.1 (H) 80.0 - 100.0 fL   MCH 36.1 (H) 26.0 - 34.0 pg   MCHC 33.4 30.0 - 36.0 g/dL   RDW 25.3 66.4 - 40.3 %   Platelets 68 (L) 150 - 400 K/uL   nRBC 0.0 0.0 - 0.2 %   Neutrophils Relative % 34 %   Neutro Abs 0.9 (L) 1.7 - 7.7 K/uL   Lymphocytes Relative 54 %   Lymphs Abs 1.5 0.7 - 4.0 K/uL   Monocytes Relative 7 %   Monocytes Absolute 0.2 0.1 - 1.0 K/uL   Eosinophils Relative 3 %   Eosinophils Absolute 0.1 0.0 - 0.5 K/uL   Basophils Relative 1 %   Basophils Absolute 0.0 0.0 - 0.1 K/uL   WBC Morphology Mild Left Shift (1-5% metas, occ myelo)     Comment: SMUDGE CELLS   RBC Morphology UNREMARKABLE    Smear Review Normal platelet morphology     Comment: PLATELETS APPEAR DECREASED   Immature Granulocytes 1 %   Abs  Immature Granulocytes 0.03 0.00 - 0.07 K/uL    Comment: Performed at [facility], [address] [place], [place] [address]  D-dimer, quantitative     Status: Abnormal   Collection Time: 01/21/24  9:35 AM  Result Value Ref Range   D-Dimer, Quant 0.86 (H) 0.00 - 0.50 ug/mL-FEU    Comment: (NOTE) At the manufacturer cut-off value of 0.5 g/mL FEU, this assay has a negative predictive value of 95-100%.This assay is intended for use in conjunction with a clinical pretest probability (PTP) assessment model to exclude pulmonary embolism (PE) and deep venous thrombosis (DVT) in outpatients suspected of PE or DVT. Results should be correlated with clinical presentation. Performed at [facility], [address] [place], [place] [address]   Glucose, capillary     Status: Abnormal   Collection Time: 02/03/24  9:15 AM  Result Value Ref Range  Glucose-Capillary 108 (H) 70 - 99 mg/dL    Comment: Glucose reference range applies only to samples taken after fasting for at least 8 hours.  Lipase, blood     Status: None   Collection Time: 02/03/24  1:42 PM  Result Value Ref Range   Lipase 31 11 - 51 U/L    Comment: Performed at [facility], [address] [place], [place] [address]  Comprehensive metabolic panel     Status: Abnormal   Collection Time: 02/03/24  1:42 PM  Result Value Ref Range   Sodium 137 135 - 145 mmol/L   Potassium 3.2 (L) 3.5 - 5.1 mmol/L   Chloride 102 98 - 111 mmol/L   CO2 22 22 - 32 mmol/L   Glucose, Bld 147 (H) 70 - 99 mg/dL    Comment: Glucose reference range applies only to samples taken after fasting for at least 8 hours.   BUN 16 6 - 20 mg/dL   Creatinine, Ser 6.21 (H) 0.44 - 1.00 mg/dL   Calcium  10.0 8.9 - 10.3 mg/dL   Total Protein 5.9 (L) 6.5 - 8.1 g/dL   Albumin 3.6 3.5 - 5.0 g/dL   AST 23 15 - 41 U/L   ALT 17 0 - 44 U/L   Alkaline Phosphatase 78 38 - 126 U/L   Total Bilirubin 1.2 0.0 - 1.2 mg/dL   GFR, Estimated 35 (L) >60  mL/min    Comment: (NOTE) Calculated using the CKD-EPI Creatinine Equation (2021)    Anion gap 13 5 - 15    Comment: Performed at [facility], [address] [place], [place] [address]  CBC     Status: Abnormal   Collection Time: 02/03/24  1:42 PM  Result Value Ref Range   WBC 3.3 (L) 4.0 - 10.5 K/uL   RBC 3.47 (L) 3.87 - 5.11 MIL/uL   Hemoglobin 12.4 12.0 - 15.0 g/dL   HCT 78.4 69.6 - 29.5 %   MCV 105.2 (H) 80.0 - 100.0 fL   MCH 35.7 (H) 26.0 - 34.0 pg   MCHC 34.0 30.0 - 36.0 g/dL   RDW 28.4 13.2 - 44.0 %   Platelets 44 (L) 150 - 400 K/uL    Comment: Immature Platelet Fraction may be clinically indicated, consider ordering this additional test [id number]    nRBC 0.0 0.0 - 0.2 %    Comment: Performed at [facility], [address] [place], [place] [address]    Assessment & Plan Musculoskeletal chest wall pain Acute right-sided musculoskeletal chest wall pain, likely due to muscle strain or bruise. Rib fracture less likely due to absence of trauma and respiratory symptoms. Explained pain likely from muscle related and Lidoderm  patches can numb area. - Order chest x-ray to rule out rib fracture. - Prescribe Lidoderm  patches for pain management. Instruct to apply to painful area, cutting to size if necessary. - Advise Tylenol  500 mg four times daily for pain relief. - Instruct to avoid movements exacerbating pain. - Discussed purchasing small quantity of Lidoderm  patches if not covered by insurance and using GoodRx to reduce cost. - Reassure improvement expected with pain management and rest.

## 2024-02-08 ENCOUNTER — Other Ambulatory Visit: Payer: Self-pay | Admitting: Oncology

## 2024-02-08 DIAGNOSIS — C9110 Chronic lymphocytic leukemia of B-cell type not having achieved remission: Secondary | ICD-10-CM

## 2024-02-10 ENCOUNTER — Telehealth: Payer: Self-pay | Admitting: Pharmacy Technician

## 2024-02-10 ENCOUNTER — Other Ambulatory Visit (HOSPITAL_COMMUNITY): Payer: Self-pay

## 2024-02-10 ENCOUNTER — Encounter: Payer: Self-pay | Admitting: Oncology

## 2024-02-10 ENCOUNTER — Telehealth: Payer: Self-pay | Admitting: Family Medicine

## 2024-02-10 NOTE — Telephone Encounter (Signed)
 Pharmacy Patient Advocate Encounter   Received notification from CoverMyMeds that prior authorization for Lidocaine  5% patches is required/requested.   Insurance verification completed.   The patient is insured through [facility] .   Per test claim: PA required; PA submitted to above mentioned insurance via CoverMyMeds Key/confirmation #/EOC [id number] Status is pending

## 2024-02-10 NOTE — Telephone Encounter (Signed)
 Pharmacy Patient Advocate Encounter  Received notification from [facility] that Prior Authorization for Lidocaine  5% patches has been DENIED.  Full denial letter will be uploaded to the media tab. See denial reason below.     PA #/Case ID/Reference #: [id number]

## 2024-02-10 NOTE — Telephone Encounter (Signed)
atorvastatin (LIPITOR) 10 MG tablet  ° °

## 2024-02-10 NOTE — Telephone Encounter (Signed)
 It has been filled by another provider not in this office.

## 2024-02-11 ENCOUNTER — Other Ambulatory Visit: Payer: Self-pay | Admitting: *Deleted

## 2024-02-11 ENCOUNTER — Other Ambulatory Visit: Payer: Self-pay | Admitting: Family Medicine

## 2024-02-11 DIAGNOSIS — R200 Anesthesia of skin: Secondary | ICD-10-CM | POA: Diagnosis not present

## 2024-02-11 DIAGNOSIS — C9110 Chronic lymphocytic leukemia of B-cell type not having achieved remission: Secondary | ICD-10-CM

## 2024-02-11 NOTE — Telephone Encounter (Unsigned)
 Copied from CRM [id number]. Topic: Clinical - Medication Refill >> Feb 11, 2024  9:30 AM Rosaria Common wrote: Medication: atorvastatin  (LIPITOR) 10 MG tablet  Has the patient contacted their pharmacy? Yes (Agent: If no, request that the patient contact the pharmacy for the refill. If patient does not wish to contact the pharmacy document the reason why and proceed with request.) (Agent: If yes, when and what did the pharmacy advise?)  This is the patient's preferred pharmacy:    [facility] [address] [place] [address] Phone: [phone] Fax: [phone]    Is this the correct pharmacy for this prescription? Yes If no, delete pharmacy and type the correct one.   Has the prescription been filled recently? Yes  Is the patient out of the medication? No  Has the patient been seen for an appointment in the last year OR does the patient have an upcoming appointment? Yes  Can we respond through MyChart? Yes  Agent: Please be advised that Rx refills may take up to 3 business days. We ask that you follow-up with your pharmacy.

## 2024-02-12 ENCOUNTER — Inpatient Hospital Stay: Attending: Oncology

## 2024-02-12 ENCOUNTER — Ambulatory Visit: Payer: Self-pay | Admitting: Family Medicine

## 2024-02-12 ENCOUNTER — Encounter: Payer: Self-pay | Admitting: Oncology

## 2024-02-12 ENCOUNTER — Inpatient Hospital Stay: Admitting: Pharmacist

## 2024-02-12 ENCOUNTER — Inpatient Hospital Stay (HOSPITAL_BASED_OUTPATIENT_CLINIC_OR_DEPARTMENT_OTHER): Admitting: Oncology

## 2024-02-12 VITALS — BP 103/65 | HR 73 | Temp 97.5°F | Resp 16 | Ht 67.0 in | Wt 177.9 lb

## 2024-02-12 DIAGNOSIS — C9110 Chronic lymphocytic leukemia of B-cell type not having achieved remission: Secondary | ICD-10-CM

## 2024-02-12 DIAGNOSIS — F419 Anxiety disorder, unspecified: Secondary | ICD-10-CM | POA: Insufficient documentation

## 2024-02-12 DIAGNOSIS — D696 Thrombocytopenia, unspecified: Secondary | ICD-10-CM | POA: Insufficient documentation

## 2024-02-12 DIAGNOSIS — F17210 Nicotine dependence, cigarettes, uncomplicated: Secondary | ICD-10-CM | POA: Diagnosis not present

## 2024-02-12 DIAGNOSIS — G629 Polyneuropathy, unspecified: Secondary | ICD-10-CM | POA: Diagnosis not present

## 2024-02-12 DIAGNOSIS — Z79899 Other long term (current) drug therapy: Secondary | ICD-10-CM | POA: Diagnosis not present

## 2024-02-12 DIAGNOSIS — D709 Neutropenia, unspecified: Secondary | ICD-10-CM | POA: Insufficient documentation

## 2024-02-12 DIAGNOSIS — Z808 Family history of malignant neoplasm of other organs or systems: Secondary | ICD-10-CM | POA: Insufficient documentation

## 2024-02-12 DIAGNOSIS — F32A Depression, unspecified: Secondary | ICD-10-CM | POA: Insufficient documentation

## 2024-02-12 LAB — CBC WITH DIFFERENTIAL (CANCER CENTER ONLY)
Abs Immature Granulocytes: 0.02 10*3/uL (ref 0.00–0.07)
Basophils Absolute: 0.1 10*3/uL (ref 0.0–0.1)
Basophils Relative: 2 %
Eosinophils Absolute: 0.2 10*3/uL (ref 0.0–0.5)
Eosinophils Relative: 4 %
HCT: 36.4 % (ref 36.0–46.0)
Hemoglobin: 12.3 g/dL (ref 12.0–15.0)
Immature Granulocytes: 0 %
Lymphocytes Relative: 72 %
Lymphs Abs: 3.3 10*3/uL (ref 0.7–4.0)
MCH: 35.1 pg — ABNORMAL HIGH (ref 26.0–34.0)
MCHC: 33.8 g/dL (ref 30.0–36.0)
MCV: 104 fL — ABNORMAL HIGH (ref 80.0–100.0)
Monocytes Absolute: 0.3 10*3/uL (ref 0.1–1.0)
Monocytes Relative: 6 %
Neutro Abs: 0.8 10*3/uL — ABNORMAL LOW (ref 1.7–7.7)
Neutrophils Relative %: 16 %
Platelet Count: 52 10*3/uL — ABNORMAL LOW (ref 150–400)
RBC: 3.5 MIL/uL — ABNORMAL LOW (ref 3.87–5.11)
RDW: 12.6 % (ref 11.5–15.5)
Smear Review: NORMAL
WBC Count: 4.6 10*3/uL (ref 4.0–10.5)
nRBC: 0 % (ref 0.0–0.2)

## 2024-02-12 LAB — CMP (CANCER CENTER ONLY)
ALT: 22 U/L (ref 0–44)
AST: 19 U/L (ref 15–41)
Albumin: 3.6 g/dL (ref 3.5–5.0)
Alkaline Phosphatase: 86 U/L (ref 38–126)
Anion gap: 8 (ref 5–15)
BUN: 20 mg/dL (ref 6–20)
CO2: 24 mmol/L (ref 22–32)
Calcium: 8.4 mg/dL — ABNORMAL LOW (ref 8.9–10.3)
Chloride: 104 mmol/L (ref 98–111)
Creatinine: 1.05 mg/dL — ABNORMAL HIGH (ref 0.44–1.00)
GFR, Estimated: >60 mL/min (ref >60)
Glucose, Bld: 95 mg/dL (ref 70–99)
Potassium: 4 mmol/L (ref 3.5–5.1)
Sodium: 136 mmol/L (ref 135–145)
Total Bilirubin: 0.8 mg/dL (ref 0.0–1.2)
Total Protein: 6.1 g/dL — ABNORMAL LOW (ref 6.5–8.1)

## 2024-02-12 NOTE — Progress Notes (Signed)
 [facility]  [phone] [phone]  ID: Monique [name] OB: [birth year]  MR#: [id number]  AOZ#:[id number]  Patient Care Team: Sowles, Krichna, MD as PCP - General (Family Medicine) Shellie Dials, MD as Consulting Physician (Oncology) Devon Fogo, [facility] (Inactive) as Pharmacist (Pharmacist) Queenie Brunet, RN as Registered Nurse (Oncology) Borders, Carlene Che, NP as Nurse Practitioner (Hospice and Palliative Medicine) Glenis Langdon, MD as Consulting Physician (Radiation Oncology) Pa, [facility]  CHIEF COMPLAINT: CLL.  INTERVAL HISTORY: Patient returns to clinic today for further evaluation, discussion of her PET scan results, and continuation of Revlimid .  She continues to have right rib pain that is musculoskeletal in nature, but states this is improving.  She otherwise feels well.  She denies any further fevers.  She continues to have a chronic peripheral neuropathy. She has no other neurologic complaints.  She has a good diet and her weight has remained stable.  She has no chest pain, shortness of breath, cough, or hemoptysis.  She denies any nausea, vomiting, constipation, or diarrhea.  She has no melena or hematochezia.  She has no urinary complaints.  Patient offers no further specific complaints today.  REVIEW OF SYSTEMS:   Review of Systems  Constitutional: Negative.  Negative for diaphoresis, fever, malaise/fatigue and weight loss.  HENT:  Negative for congestion.   Respiratory: Negative.  Negative for cough and shortness of breath.   Cardiovascular: Negative.  Negative for chest pain and leg swelling.  Gastrointestinal: Negative.  Negative for abdominal pain, constipation and diarrhea.  Genitourinary: Negative.  Negative for dysuria and frequency.  Musculoskeletal: Negative.  Negative for back pain, myalgias and neck pain.  Skin: Negative.  Negative for rash.  Neurological:  Positive for tingling and sensory  change. Negative for focal weakness and weakness.  Psychiatric/Behavioral: Negative.  Negative for depression and suicidal ideas. The patient is not nervous/anxious.     As per HPI. Otherwise, a complete review of systems is negative.  PAST MEDICAL HISTORY: Past Medical History:  Diagnosis Date   Anxiety    Bursitis    leg pain   Carpal tunnel syndrome    Cervical dysplasia    hx LEEP over 18 years ago.    Chronic lymphocytic leukemia (HCC) 2018   Dr Adrian Alba.  (in lymph nodes)   COPD (chronic obstructive pulmonary disease) (HCC)    COVID-19 2021   Depression    Eating disorder    GERD (gastroesophageal reflux disease)    History of self-harm    Insomnia    Obsession    Tobacco abuse    Vitamin B12 deficiency (non anemic)     PAST SURGICAL HISTORY: Past Surgical History:  Procedure Laterality Date   BREAST BIOPSY Right 01/05/2018   US  guided biopsy of 2 areas and 1 lymph node, MIXED INFLAMMATION AND GIANT CELL REACTION   CERVICAL BIOPSY  W/ LOOP ELECTRODE EXCISION     COLONOSCOPY WITH PROPOFOL  N/A 04/21/2019   Procedure: COLONOSCOPY WITH PROPOFOL ;  Surgeon: Irby Mannan, MD;  Location: [facility] ENDOSCOPY;  Service: Gastroenterology;  Laterality: N/A;   OTHER SURGICAL HISTORY     scar tissue removed from vocal cords   TUBAL LIGATION     VAGINAL HYSTERECTOMY N/A 01/22/2021   Procedure: HYSTERECTOMY VAGINAL; BILATERAL SALPINGECTOMY;  Surgeon: Teresa Fender, MD;  Location: [facility] ORS;  Service: Gynecology;  Laterality: N/A;   vocal cord surgery  2005    FAMILY HISTORY: Family History  Problem Relation Age of Onset  Depression Mother    Cancer Mother        thyroid   Alcohol abuse Father    COPD Father    Alcohol abuse Brother    Depression Brother    Bipolar disorder Brother    Suicidality Brother    ADD / ADHD Son    Breast cancer Neg Hx     ADVANCED DIRECTIVES (Y/N):  N  HEALTH MAINTENANCE: Social History   Tobacco Use   Smoking status: Every Day     Types: E-cigarettes   Smokeless tobacco: Never  Vaping Use   Vaping status: Former  Substance Use Topics   Alcohol use: Not Currently    Alcohol/week: 0.0 standard drinks of alcohol    Comment: rarely   Drug use: Yes    Frequency: 7.0 times per week    Types: Marijuana     Colonoscopy:  PAP:  Bone density:  Lipid panel:  Allergies  Allergen Reactions   Doxycycline  Rash    Rash on cheeks and sides of nose    Duloxetine  Rash    Current Outpatient Medications  Medication Sig Dispense Refill   albuterol  (VENTOLIN  HFA) 108 (90 Base) MCG/ACT inhaler Inhale 2 puffs into the lungs every 6 (six) hours as needed for wheezing or shortness of breath. 8 g 0   ARIPiprazole  (ABILIFY ) 5 MG tablet Take 1 tablet (5 mg total) by mouth daily. 90 tablet 1   ASPIRIN  81 PO Take 81 mg by mouth daily.     atorvastatin  (LIPITOR) 10 MG tablet Take 1 tablet (10 mg total) by mouth daily. 100 tablet 3   baclofen  (LIORESAL ) 10 MG tablet Take 1 tablet (10 mg total) by mouth 2 (two) times daily. Take 6 hours after morning tizanidine  dose 30 each 0   Ca Phosphate-Cholecalciferol (CALTRATE GUMMY BITES) 250-10 MG-MCG CHEW Chew 1 gummy by mouth 2 (two) times daily. 180 tablet 3   estrogens , conjugated, (PREMARIN ) 0.625 MG tablet Take 1 tablet (0.625 mg total) by mouth daily for 21 days, then do not take for 7 days. 90 tablet 0   famotidine  (PEPCID ) 10 MG tablet Take by mouth.     lenalidomide  (REVLIMID ) 10 MG capsule TAKE 1 CAPSULE BY MOUTH EVERY DAY 28 capsule 0   levocetirizine (XYZAL ) 5 MG tablet Take 1 tablet (5 mg total) by mouth every evening. 30 tablet 1   lidocaine  (LIDODERM ) 5 % Place 1 patch onto the skin daily. Remove & Discard patch within 12 hours or as directed by MD 30 patch 0   loratadine  (CLARITIN ) 10 MG tablet Take 1 tablet (10 mg total) by mouth daily in the morning. 90 tablet 1   nortriptyline  (PAMELOR ) 10 MG capsule Take 1 tablet (10 mg total) by mouth at night for one week then increase to 20  mg at night 60 capsule 3   omeprazole  (PRILOSEC) 20 MG capsule Take 1 capsule (20 mg total) by mouth in the morning. 30 capsule 4   pregabalin  (LYRICA ) 150 MG capsule Take 1 capsule (150 mg total) by mouth 3 (three) times daily. 90 capsule 1   tiZANidine  (ZANAFLEX ) 2 MG tablet Take 1 tablet (2 mg total) by mouth at bedtime. 90 tablet 1   umeclidinium-vilanterol (ANORO ELLIPTA ) 62.5-25 MCG/ACT AEPB Inhale 1 puff into the lungs daily. 180 each 1   vitamin B-12 (CYANOCOBALAMIN ) 500 MCG tablet Take 250 mcg by mouth every morning.     No current facility-administered medications for this visit.    OBJECTIVE: Vitals:  02/12/24 0955  BP: 103/65  Pulse: 73  Resp: 16  Temp: (!) 97.5 F (36.4 C)  SpO2: 100%       Body mass index is 27.86 kg/m.    ECOG FS:0 - Asymptomatic  General: Well-developed, well-nourished, no acute distress. Eyes: Pink conjunctiva, anicteric sclera. HEENT: Normocephalic, moist mucous membranes. Lungs: No audible wheezing or coughing. Heart: Regular rate and rhythm. Abdomen: Soft, nontender, no obvious distention. Musculoskeletal: No edema, cyanosis, or clubbing. Neuro: Alert, answering all questions appropriately. Cranial nerves grossly intact. Skin: No rashes or petechiae noted. Psych: Normal affect.  LAB RESULTS:  Lab Results  Component Value Date   NA 136 02/12/2024   K 4.0 02/12/2024   CL 104 02/12/2024   CO2 24 02/12/2024   GLUCOSE 95 02/12/2024   BUN 20 02/12/2024   CREATININE 1.05 (H) 02/12/2024   CALCIUM  8.4 (L) 02/12/2024   PROT 6.1 (L) 02/12/2024   ALBUMIN 3.6 02/12/2024   AST 19 02/12/2024   ALT 22 02/12/2024   ALKPHOS 86 02/12/2024   BILITOT 0.8 02/12/2024   GFRNONAA >60 02/12/2024   GFRAA 79 12/06/2020    Lab Results  Component Value Date   WBC 4.6 02/12/2024   NEUTROABS 0.8 (L) 02/12/2024   HGB 12.3 02/12/2024   HCT 36.4 02/12/2024   MCV 104.0 (H) 02/12/2024   PLT 52 (L) 02/12/2024     STUDIES: DG Ribs Unilateral  Right Result Date: 02/12/2024 CLINICAL DATA:  Right rib and pleuritic chest pain. EXAM: RIGHT RIBS - 2 VIEW COMPARISON:  Chest x-ray on 01/02/2024 FINDINGS: Mild cortical irregularity is seen involving the right lateral 6th rib, suspicious for nondisplaced rib fracture. Tiny right hemothorax noted, without signs of pneumothorax. IMPRESSION: Probable nondisplaced fracture of right lateral 6th rib. Tiny right hemothorax. No evidence of pneumothorax. Electronically Signed   By: Marlyce Sine M.D.   On: 02/12/2024 11:01   MR LUMBAR SPINE WO CONTRAST Result Date: 02/12/2024 CLINICAL DATA:  Right leg numbness.  Back pain.  History of CLL EXAM: MRI LUMBAR SPINE WITHOUT CONTRAST TECHNIQUE: Multiplanar, multisequence MR imaging of the lumbar spine was performed. No intravenous contrast was administered. COMPARISON:  06/02/2020 FINDINGS: Segmentation:  Standard. Alignment:  Physiologic. Vertebrae: Decreased T1 and T2 bone marrow signal is similar to the prior study and likely related to patient's known CLL. No well-defined or expansile bone lesion within the lumbar spine. No fracture. No evidence of discitis. Conus medullaris and cauda equina: Conus extends to the T12-L1 level. Conus and cauda equina appear normal. Paraspinal and other soft tissues: Bulky retroperitoneal lymphadenopathy, recently assessed by PET-CT on 02/03/2024. Disc levels: T12-L1 through L3-L4: Unremarkable. L4-L5: Disc desiccation and height loss with annular disc bulge and shallow right paracentral disc protrusion. Mild facet hypertrophy. Mild right subarticular recess stenosis without canal stenosis. Borderline-mild bilateral foraminal stenosis. Mild interval progression. L5-S1: Unremarkable. IMPRESSION: 1. Mild interval progression of degenerative disc disease at L4-L5 with mild right subarticular recess stenosis and borderline-mild bilateral foraminal stenosis. 2. No canal stenosis at any level. 3. Bulky retroperitoneal lymphadenopathy, recently  assessed by PET-CT on 02/03/2024. 4. Diffusely decreased T1 and T2 bone marrow signal is similar to the prior study and likely related to patient's known CLL. Electronically Signed   By: Leverne Reading D.O.   On: 02/12/2024 10:47   NM PET Image Restage (PS) Skull Base to Thigh (F-18 FDG) Result Date: 02/09/2024 CLINICAL DATA:  Subsequent treatment strategy for CLL. EXAM: NUCLEAR MEDICINE PET SKULL BASE TO THIGH TECHNIQUE: 9.54 mCi F-18  FDG was injected intravenously. Full-ring PET imaging was performed from the skull base to thigh after the radiotracer. CT data was obtained and used for attenuation correction and anatomic localization. Fasting blood glucose: 108 mg/dl COMPARISON:  PET-CT 40/98/1191. FINDINGS: Mediastinal blood pool activity: SUV max 2.5 Liver activity: SUV max 3.0 NECK: Neck is several small subcentimeter nodes. These are not pathologic by size criteria but more numerous than usually seen. Some of these appear larger. Example posteriorly left neck deep to the sternocleidomastoid muscle has a node on image 18 of series 6 measuring 16 x 7 mm on the current examination. Previously this measured 11 by 5 mm. This lesion has some very mild uptake of maximum SUV of 2.5. Other lesions do not have any abnormal uptake at this time but again are larger. Example thoracic inlet on the left has a node measuring 17 x 8 mm on image 35 today. Previously 15 x 5 mm. Maximum SUV value of 2.7. Near symmetric uptake of the visualized intracranial compartment. Incidental CT findings: The parotid glands, submandibular glands unremarkable. Thyroid glands preserved. Paranasal sinuses and mastoid air cells are clear. CHEST: No abnormal uptake along the lung parenchyma. On the prior there is some mild uptake along axillary nodes. Area on the right previously had a maximum SUV value of 2.8. This same area 2.8 once again. Nodes appears slightly larger today. Example today of a node on image 44 has short axis of 18 mm. This  node on the prior would have had a short axis dimension 12-13 mm. Left axillary nodes are also slightly larger but again have similar uptake to prior. There also increasing size nodes along the chest wall, mediastinum. Example right paratracheal node today on image 46 has short axis of 20 mm and previously 15 mm. This node previously had maximum SUV value of 3.7 and today 3.2. Other nodes include prevascular, subcarinal. Incidental CT findings: Heart is nonenlarged. Trace pericardial fluid. The thoracic aorta is normal course and caliber with some scattered vascular calcifications. Breathing motion. Increased small right pleural effusion with some pleural thickening. Minimal uptake. No consolidation or pneumothorax. Small right-sided lung nodules are stable such as focus on image 59 of series 6 measuring 6 mm today and previously 5 mm. Simple continued follow up surveillance. ABDOMEN/PELVIS: There is physiologic distribution radiotracer along the parenchymal organs, bowel and renal collecting systems. Overall the spleen has homogeneous uptake with maximum SUV value of 3.0. Cephalocaudal length of the spleen measures 12 cm today and previously when measured in same fashion 12 cm. Bulky retroperitoneal nodes are again identified. Some mesenteric nodes. Example includes left periaortic lesion which previously measured diameter of 4.3 cm with maximum SUV 6.7. Today short axis dimension of 4.1 cm on image 112. SUV value maximum 5.0. Periaortic node at the level of the left renal vein which previously measured 2.1 cm in short axis with maximum SUV value of 4.1. Today this node is larger measuring 3.4 cm on image 99 and has maximum SUV of 6.6. Overall many nodes are increased in size such as left periaortic as well as left side of the base of the mesentery. Increasing size nodes near the gastrohepatic ligament, lesser curve the stomach and splenic hilum. Pelvic nodes are also again seen bilaterally along the obturator and  external iliac chains. Specific lesion seen previously measured 18 mm in short axis on the previous with maximum SUV of 3.8. Today this node has maximum SUV of 3.4 and thickness of 2.2 cm on image  140. Other nodes in the pelvis are also larger bilaterally. Incidental CT findings: Grossly the liver, adrenal gland on the right and pancreas are preserved. Slight thickening of the left adrenal gland is stable. No abnormal uptake. Mild vascular calcifications. Mild bilateral renal atrophy. No renal or ureteral stones. Bladder is contracted. Uterus is absent. No separate adnexal mass lower than the nodes described. Large bowel has a normal course and caliber. Scattered colonic stool. Normal appendix. Stomach and small bowel are nondilated. SKELETON: There are several new hypermetabolic areas along the skeleton. This includes anterior aspect of the left fourth rib with maximum SUV 6.5 on image 63. Anterior aspect of the right sixth rib with maximum SUV of 6.5. Other areas along right-sided ribcage of the fifth and eighth rib. These anterior rib lesions have a differential. Please correlate for any history of trauma or other process. However there is also uptake along the posterior elements of the spine with maximum SUV value of 9.7 at T9. Incidental CT findings: Mild degenerative changes. IMPRESSION: Overall increased size of lymph nodes diffusely in the neck, chest, abdomen and pelvis. Level uptake of these nodes is similar to previous examination. No abnormal uptake in the spleen. Spleen is not abnormally enlarged. New areas of uptake along anterior ribs bilaterally particularly left fourth rib, right sixth rib but also right fifth and eighth ribs. Is also uptake in the thoracic spine along posterior elements at T9. Please correlate for any known history. Otherwise recommend further workup particularly the spine lesion with MRI of the thoracic spine with and without contrast. New small right pleural effusion with pleural  thickening. Low-level uptake in this location. The pleural thickening is progressive from previous. Worrisome for potential subtle area of disease. Electronically Signed   By: Adrianna Horde M.D.   On: 02/09/2024 19:12   US  Venous Img Lower Unilateral Right (DVT) Result Date: 01/22/2024 CLINICAL DATA:  53 year old female with right leg numbness and positive D-dimer EXAM: RIGHT LOWER EXTREMITY VENOUS DOPPLER ULTRASOUND TECHNIQUE: Gray-scale sonography with graded compression, as well as color Doppler and duplex ultrasound were performed to evaluate the lower extremity deep venous systems from the level of the common femoral vein and including the common femoral, femoral, profunda femoral, popliteal and calf veins including the posterior tibial, peroneal and gastrocnemius veins when visible. The superficial great saphenous vein was also interrogated. Spectral Doppler was utilized to evaluate flow at rest and with distal augmentation maneuvers in the common femoral, femoral and popliteal veins. COMPARISON:  None Available. FINDINGS: Contralateral Common Femoral Vein: Respiratory phasicity is normal and symmetric with the symptomatic side. No evidence of thrombus. Normal compressibility. Common Femoral Vein: No evidence of thrombus. Normal compressibility, respiratory phasicity and response to augmentation. Saphenofemoral Junction: No evidence of thrombus. Normal compressibility and flow on color Doppler imaging. Profunda Femoral Vein: No evidence of thrombus. Normal compressibility and flow on color Doppler imaging. Femoral Vein: No evidence of thrombus. Normal compressibility, respiratory phasicity and response to augmentation. Popliteal Vein: No evidence of thrombus. Normal compressibility, respiratory phasicity and response to augmentation. Calf Veins: No evidence of thrombus. Normal compressibility and flow on color Doppler imaging. Superficial Great Saphenous Vein: No evidence of thrombus. Normal compressibility  and flow on color Doppler imaging. Other Findings:  None. IMPRESSION: Directed duplex the right lower extremity negative for DVT Signed, Marciano Settles. Rexine Cater, RPVI [facility] Electronically Signed   By: Myrlene Asper D.O.   On: 01/22/2024 08:40  ONCOLOGY HISTORY:  Patient completed cycle 3 of Rituxan  plus Treanda  on March 26, 2018, but was then noted to have progressive disease.  She was then initiated on 480 mg Imbruvica  in November 2019, but had progression of disease with increasing white blood cell count as well as CT scan on Jan 22, 2022 revealing increased lymphadenopathy consistent with progression of disease.  Attempted patient on venetoclax , but she could not tolerate it secondary to persistent diarrhea and transaminitis.   ASSESSMENT: CLL.  PLAN:    CLL: Confirmed by peripheral blood flow cytometry.  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  Patient initially completed 4 weekly cycles of Rituxan  then was transition to treatment every 4 weeks.  She last received Rituxan  on May 07, 2023.  She also recently completed XRT to her left axilla.  PET scan results from Feb 06, 2024 reviewed independently and report as above with mild progression of disease in multiple lymph nodes above and below the diaphragm.  SUV has remained stable in these lesions.  Despite progression, will continue with Revlimid  10 mg daily.  Continue Claritin  and famotidine  along with treatments.  No further XRT is planned at this time.  We discussed the possibility that if patient has continued progression, she may require referral to [facility] or [facility] for consideration of CAR-T therapy.  Pirtobrutinib is also an option.  Return to clinic in 4 weeks with repeat laboratory work and further evaluation.  Prescient clinical pharmacy input.   Neutropenia: Chronic and unchanged.  Patient's ANC is  0.8.  Proceed cautiously with Revlimid  as above. Thrombocytopenia: Platelet count remains decreased at 52.  Monitor. Anemia: Resolved. Trigeminal neuralgia: Resolved.  MRI of the brain on Jan 25, 2019 was unremarkable.   Depression/anxiety: Chronic and unchanged.  Continue follow-up and treatment per primary care. Neuropathic pain/peripheral neuropathy: Chronic and unchanged.  She reports she is now on disability.  Continue current follow-up and treatment as per neurology. Rituxan  reaction: Patient will require additional premedications for preventative measures if Rituxan  is reinitiated in the future. Renal insufficiency: Essentially resolved.   Patient expressed understanding and was in agreement with this plan. She also understands that She can call clinic at any time with any questions, concerns, or complaints.    Shellie Dials, MD   02/12/2024 1:36 PM

## 2024-02-12 NOTE — Progress Notes (Signed)
 Clinical Pharmacist Practitioner Clinic [facility]  [phone] [phone]  Patient Care Team: Sowles, Krichna, MD as PCP - General (Family Medicine) Shellie Dials, MD as Consulting Physician (Oncology) Devon Fogo, [facility] (Inactive) as Pharmacist (Pharmacist) Queenie Brunet, RN as Registered Nurse (Oncology) Borders, Carlene Che, NP as Nurse Practitioner (Hospice and Palliative Medicine) Glenis Langdon, MD as Consulting Physician (Radiation Oncology) Pa, [facility]   Name of the patient: Monique [name]  [id number]  July 07, 1971   Date of visit: 02/12/24  HPI: Patient is a 53 y.o. female with progressive CLL, previously treated with ibrutinib . Patient was intolerant to venetoclax  treatment initiation and did not want to retry initiating venetoclax . The plan is to now treat her with rituximab  and lenalidomide . Patient started rituximab  on 02/28/22 and lenalidomide  03/01/22.   On 04/03/22, patient presented to clinic with a rash. Her lenalidomide  was held and she was treated with prednisone  (1 week) and loratadine  continuosly. Rash improved by 04/10/22 f/u appt and she was restarted on lenalidomide .   On 04/28/22, patient called to report her rash had worsened and she stopped the lenalidomide  on 04/23/22. She was stated on another prednisone  taper. She was to taper down to 5mg  daily, then resume her lenalidomide . She resumed her lenalidomide  on 05/16/22. Patient stopped rituximab  after receiving 16 cycles, last dose 05/07/23.   Patient's lenalidomide  was held on 11/17/23 due to neutropenia and thrombocytopenia. Patient's counts have recovered and she will resume treatment on 12/17/23.  Reason for Consult: Oral chemotherapy follow-up for lenalidomide  therapy.   PAST MEDICAL HISTORY: Past Medical History:  Diagnosis Date   Anxiety    Bursitis    leg pain   Carpal tunnel syndrome    Cervical dysplasia    hx LEEP over 18 years ago.     Chronic lymphocytic leukemia (HCC) 2018   Dr Adrian Alba.  (in lymph nodes)   COPD (chronic obstructive pulmonary disease) (HCC)    COVID-19 2021   Depression    Eating disorder    GERD (gastroesophageal reflux disease)    History of self-harm    Insomnia    Obsession    Tobacco abuse    Vitamin B12 deficiency (non anemic)     HEMATOLOGY/ONCOLOGY HISTORY:  Oncology History Overview Note  Patient with recent CLL diagnosis by flow cytometry.  Previously not being treated in observation.   S/p 4 cycles of weekly Rituxan  completing on 02/20/2017 scans from January 2018 revealed minimal progression.   Developed right breast mass in March 2019.  Had diagnostic mammogram performed on 12/26/2017 showed suspicious right breast mass.  Biopsy revealed granulomatous mastitis.  She was treated with doxycycline  100 mg twice daily for 7 days.   Developed worsening adenopathy.  CT chest/abdomen/pelvis and soft tissue neck from May 2019 revealed progressive bulky adenopathy in the chest, abdomen and pelvis compatible with worsening disease.   Dr. Adrian Alba recommended beginning treatment with Rituxan  and Treanda .  Received first cycle last week.  S/p 6 cycles of Rituxan  and Treanda .  Tolerating well.  Last dose given 03/25/18.   CLL (chronic lymphocytic leukemia) (HCC)  11/24/2016 Initial Diagnosis   CLL (chronic lymphocytic leukemia) (HCC)   01/21/2018 - 03/26/2018 Chemotherapy   The patient had palonosetron  (ALOXI ) injection 0.25 mg, 0.25 mg, Intravenous,  Once, 4 of 5 cycles Administration: 0.25 mg (01/28/2018), 0.25 mg (02/25/2018), 0.25 mg (03/25/2018) riTUXimab  (RITUXAN ) 700 mg in sodium chloride  0.9 % 250 mL (2.1875 mg/mL) infusion, 375 mg/m2 = 700 mg, Intravenous,  Once, 4 of 5 cycles Administration: 700 mg (01/28/2018) bendamustine  (BENDEKA ) 150 mg in sodium chloride  0.9 % 50 mL (2.6786 mg/mL) chemo infusion, 90 mg/m2 = 150 mg, Intravenous,  Once, 4 of 5 cycles Administration: 150 mg (01/28/2018), 150  mg (01/29/2018), 150 mg (02/25/2018), 150 mg (02/26/2018), 150 mg (03/25/2018), 150 mg (03/26/2018)  for chemotherapy treatment.    02/28/2022 - 04/18/2022 Chemotherapy   Patient is on Treatment Plan : NON-HODGKINS LYMPHOMA Rituximab  q7d     02/28/2022 - 05/07/2023 Chemotherapy   Patient is on Treatment Plan : NON-HODGKINS LYMPHOMA Rituximab  q28d     03/12/2022 - 03/12/2022 Chemotherapy   Patient is on Treatment Plan : LYMPHOMA CLL/SLL Venetoclax  ramp up / Venetoclax  + Rituximab  (375/500) q28d x 6 cycles / Venetoclax  q28d x 18 cycles       ALLERGIES:  is allergic to doxycycline  and duloxetine .  MEDICATIONS:  Current Outpatient Medications  Medication Sig Dispense Refill   albuterol  (VENTOLIN  HFA) 108 (90 Base) MCG/ACT inhaler Inhale 2 puffs into the lungs every 6 (six) hours as needed for wheezing or shortness of breath. 8 g 0   ARIPiprazole  (ABILIFY ) 5 MG tablet Take 1 tablet (5 mg total) by mouth daily. 90 tablet 1   ASPIRIN  81 PO Take 81 mg by mouth daily.     atorvastatin  (LIPITOR) 10 MG tablet Take 1 tablet (10 mg total) by mouth daily. 100 tablet 3   baclofen  (LIORESAL ) 10 MG tablet Take 1 tablet (10 mg total) by mouth 2 (two) times daily. Take 6 hours after morning tizanidine  dose 30 each 0   Ca Phosphate-Cholecalciferol (CALTRATE GUMMY BITES) 250-10 MG-MCG CHEW Chew 1 gummy by mouth 2 (two) times daily. 180 tablet 3   estrogens , conjugated, (PREMARIN ) 0.625 MG tablet Take 1 tablet (0.625 mg total) by mouth daily for 21 days, then do not take for 7 days. 90 tablet 0   famotidine  (PEPCID ) 10 MG tablet Take by mouth.     lenalidomide  (REVLIMID ) 10 MG capsule TAKE 1 CAPSULE BY MOUTH EVERY DAY 28 capsule 0   levocetirizine (XYZAL ) 5 MG tablet Take 1 tablet (5 mg total) by mouth every evening. 30 tablet 1   lidocaine  (LIDODERM ) 5 % Place 1 patch onto the skin daily. Remove & Discard patch within 12 hours or as directed by MD 30 patch 0   loratadine  (CLARITIN ) 10 MG tablet Take 1 tablet (10 mg  total) by mouth daily in the morning. 90 tablet 1   nortriptyline  (PAMELOR ) 10 MG capsule Take 1 tablet (10 mg total) by mouth at night for one week then increase to 20 mg at night 60 capsule 3   omeprazole  (PRILOSEC) 20 MG capsule Take 1 capsule (20 mg total) by mouth in the morning. 30 capsule 4   pregabalin  (LYRICA ) 150 MG capsule Take 1 capsule (150 mg total) by mouth 3 (three) times daily. 90 capsule 1   tiZANidine  (ZANAFLEX ) 2 MG tablet Take 1 tablet (2 mg total) by mouth at bedtime. 90 tablet 1   umeclidinium-vilanterol (ANORO ELLIPTA ) 62.5-25 MCG/ACT AEPB Inhale 1 puff into the lungs daily. 180 each 1   vitamin B-12 (CYANOCOBALAMIN ) 500 MCG tablet Take 250 mcg by mouth every morning.     No current facility-administered medications for this visit.    VITAL SIGNS: LMP 08/17/2016  There were no vitals filed for this visit.  Estimated body mass index is 27.86 kg/m as calculated from the following:   Height as of an earlier encounter on 02/12/24: 5'  7" (1.702 m).   Weight as of an earlier encounter on 02/12/24: 80.7 kg (177 lb 14.4 oz).  LABS: CBC:    Component Value Date/Time   WBC 4.6 02/12/2024 0949   WBC 3.3 (L) 02/03/2024 1342   HGB 12.3 02/12/2024 0949   HCT 36.4 02/12/2024 0949   PLT 52 (L) 02/12/2024 0949   MCV 104.0 (H) 02/12/2024 0949   NEUTROABS 0.8 (L) 02/12/2024 0949   LYMPHSABS 3.3 02/12/2024 0949   MONOABS 0.3 02/12/2024 0949   EOSABS 0.2 02/12/2024 0949   BASOSABS 0.1 02/12/2024 0949   Comprehensive Metabolic Panel:    Component Value Date/Time   NA 136 02/12/2024 0949   NA 138 05/13/2018 1413   K 4.0 02/12/2024 0949   CL 104 02/12/2024 0949   CO2 24 02/12/2024 0949   BUN 20 02/12/2024 0949   BUN 15 05/13/2018 1413   CREATININE 1.05 (H) 02/12/2024 0949   CREATININE 0.96 12/06/2020 1646   GLUCOSE 95 02/12/2024 0949   CALCIUM  8.4 (L) 02/12/2024 0949   AST 19 02/12/2024 0949   ALT 22 02/12/2024 0949   ALKPHOS 86 02/12/2024 0949   BILITOT 0.8  02/12/2024 0949   PROT 6.1 (L) 02/12/2024 0949   PROT 6.3 05/13/2018 1413   ALBUMIN 3.6 02/12/2024 0949   ALBUMIN 4.1 05/13/2018 1413     Present during today's visit: patient only  Assessment and Plan: CMP/CBC reviewed, continue lenalidomide  10mg  daily.  Will continue to monitor ANC closely  Last week patient has coughing a lot and have rib pain. Cough liked lead to rib pain. Over the last few days her coughing has decreased and her pain has improved    Oral Chemotherapy Side Effect/Intolerance:  None reported  Oral Chemotherapy Adherence: No missed doses reported No patient barriers to medication adherence identified.   New medications: None reported  Medication Access Issues: No issues, patient fills medication at [facility]  Patient expressed understanding and was in agreement with this plan. She also understands that She can call clinic at any time with any questions, concerns, or complaints.   Follow-up plan: RTC as scheduled  Thank you for allowing me to participate in the care of this very pleasant patient.   Time Total: 15 mins  Visit consisted of counseling and education on dealing with issues of symptom management in the setting of serious and potentially life-threatening illness.Greater than 50%  of this time was spent counseling and coordinating care related to the above assessment and plan.  Signed by: Leshonda Galambos N. Tellis Spivak, PharmD, Lorraine Roses, CPP Hematology/Oncology Clinical Pharmacist Practitioner [facility] [phone]  02/12/2024 2:26 PM

## 2024-02-13 NOTE — Telephone Encounter (Signed)
 Too soon for refill, refilled 01/21/24.  Requested Prescriptions  Pending Prescriptions Disp Refills   atorvastatin  (LIPITOR) 10 MG tablet 100 tablet 3    Sig: Take 1 tablet (10 mg total) by mouth daily.     Cardiovascular:  Antilipid - Statins Failed - 02/13/2024 12:14 PM      Failed - Lipid Panel in normal range within the last 12 months    Cholesterol  Date Value Ref Range Status  10/02/2023 115 <200 mg/dL Final   LDL Cholesterol (Calc)  Date Value Ref Range Status  10/02/2023 54 mg/dL (calc) Final    Comment:    Reference range: <100 . Desirable range <100 mg/dL for primary prevention;   <70 mg/dL for patients with CHD or diabetic patients  with > or = 2 CHD risk factors. Aaron Aas LDL-C is now calculated using the Martin-Hopkins  calculation, which is a validated novel method providing  better accuracy than the Friedewald equation in the  estimation of LDL-C.  Melinda Sprawls et al. Erroll Heard. 1610;960(45): 2061-2068  (http://education.[employer].com/faq/FAQ164)    HDL  Date Value Ref Range Status  10/02/2023 45 (L) > OR = 50 mg/dL Final   Triglycerides  Date Value Ref Range Status  10/02/2023 77 <150 mg/dL Final         Passed - Patient is not pregnant      Passed - Valid encounter within last 12 months    Recent Outpatient Visits           1 week ago Right-sided chest wall pain   [place] [facility] Arleen Lacer, MD   2 weeks ago Left-sided chest wall pain   [place] [facility] Arleen Lacer, MD   1 month ago CLL (chronic lymphocytic leukemia) [facility])   [place] [facility] [facility] Adeline Hone, PA-C   3 months ago Burning with urination   [facility] Quinton Buckler, FNP       Future Appointments             In 1 month Sowles, Krichna, MD [facility], [facility]

## 2024-02-16 ENCOUNTER — Telehealth: Payer: Self-pay | Admitting: *Deleted

## 2024-02-16 NOTE — Telephone Encounter (Signed)
 Called the daughter and let her know that the FMLA was done and I faxed it to her work and the transmission went through. She thanked me.

## 2024-02-17 ENCOUNTER — Other Ambulatory Visit (HOSPITAL_COMMUNITY): Payer: Self-pay

## 2024-02-17 ENCOUNTER — Other Ambulatory Visit: Payer: Self-pay

## 2024-02-17 ENCOUNTER — Other Ambulatory Visit: Payer: Self-pay | Admitting: Family Medicine

## 2024-02-17 ENCOUNTER — Encounter: Payer: Self-pay | Admitting: Oncology

## 2024-02-18 ENCOUNTER — Other Ambulatory Visit (HOSPITAL_COMMUNITY): Payer: Self-pay

## 2024-02-18 ENCOUNTER — Other Ambulatory Visit: Payer: Self-pay

## 2024-02-19 ENCOUNTER — Other Ambulatory Visit: Payer: Self-pay

## 2024-02-19 ENCOUNTER — Encounter: Payer: Self-pay | Admitting: Oncology

## 2024-02-20 ENCOUNTER — Other Ambulatory Visit: Payer: Self-pay

## 2024-02-20 ENCOUNTER — Encounter: Payer: Self-pay | Admitting: Oncology

## 2024-03-01 ENCOUNTER — Other Ambulatory Visit (HOSPITAL_COMMUNITY): Payer: Self-pay

## 2024-03-01 ENCOUNTER — Encounter: Payer: Self-pay | Admitting: Oncology

## 2024-03-03 ENCOUNTER — Other Ambulatory Visit: Payer: Self-pay

## 2024-03-03 ENCOUNTER — Encounter: Payer: Self-pay | Admitting: Family Medicine

## 2024-03-03 ENCOUNTER — Other Ambulatory Visit (HOSPITAL_COMMUNITY): Payer: Self-pay

## 2024-03-03 ENCOUNTER — Ambulatory Visit
Admission: RE | Admit: 2024-03-03 | Discharge: 2024-03-03 | Disposition: A | Discharge status: Home / Self Care | Source: Ambulatory Visit | Attending: Family Medicine | Admitting: Family Medicine

## 2024-03-03 ENCOUNTER — Ambulatory Visit
Admission: RE | Admit: 2024-03-03 | Discharge: 2024-03-03 | Disposition: A | Discharge status: Home / Self Care | Attending: Family Medicine | Admitting: Family Medicine

## 2024-03-03 ENCOUNTER — Ambulatory Visit (INDEPENDENT_AMBULATORY_CARE_PROVIDER_SITE_OTHER): Admitting: Family Medicine

## 2024-03-03 VITALS — BP 104/68 | HR 87 | Resp 16 | Ht 67.0 in | Wt 174.8 lb

## 2024-03-03 DIAGNOSIS — G629 Polyneuropathy, unspecified: Secondary | ICD-10-CM | POA: Diagnosis not present

## 2024-03-03 DIAGNOSIS — R200 Anesthesia of skin: Secondary | ICD-10-CM | POA: Diagnosis not present

## 2024-03-03 DIAGNOSIS — Z8669 Personal history of other diseases of the nervous system and sense organs: Secondary | ICD-10-CM | POA: Diagnosis not present

## 2024-03-03 DIAGNOSIS — G8929 Other chronic pain: Secondary | ICD-10-CM | POA: Diagnosis not present

## 2024-03-03 DIAGNOSIS — B001 Herpesviral vesicular dermatitis: Secondary | ICD-10-CM

## 2024-03-03 DIAGNOSIS — R0781 Pleurodynia: Secondary | ICD-10-CM

## 2024-03-03 DIAGNOSIS — Z13820 Encounter for screening for osteoporosis: Secondary | ICD-10-CM

## 2024-03-03 DIAGNOSIS — R519 Headache, unspecified: Secondary | ICD-10-CM | POA: Diagnosis not present

## 2024-03-03 DIAGNOSIS — G5603 Carpal tunnel syndrome, bilateral upper limbs: Secondary | ICD-10-CM | POA: Diagnosis not present

## 2024-03-03 DIAGNOSIS — E538 Deficiency of other specified B group vitamins: Secondary | ICD-10-CM | POA: Diagnosis not present

## 2024-03-03 DIAGNOSIS — G2581 Restless legs syndrome: Secondary | ICD-10-CM | POA: Diagnosis not present

## 2024-03-03 DIAGNOSIS — Z1331 Encounter for screening for depression: Secondary | ICD-10-CM | POA: Diagnosis not present

## 2024-03-03 DIAGNOSIS — S2241XD Multiple fractures of ribs, right side, subsequent encounter for fracture with routine healing: Secondary | ICD-10-CM | POA: Diagnosis not present

## 2024-03-03 DIAGNOSIS — G5711 Meralgia paresthetica, right lower limb: Secondary | ICD-10-CM | POA: Diagnosis not present

## 2024-03-03 DIAGNOSIS — J90 Pleural effusion, not elsewhere classified: Secondary | ICD-10-CM | POA: Diagnosis not present

## 2024-03-03 DIAGNOSIS — R202 Paresthesia of skin: Secondary | ICD-10-CM | POA: Diagnosis not present

## 2024-03-03 DIAGNOSIS — R2689 Other abnormalities of gait and mobility: Secondary | ICD-10-CM | POA: Diagnosis not present

## 2024-03-03 MED ORDER — VALACYCLOVIR HCL 1 G PO TABS
1000.0000 mg | ORAL_TABLET | Freq: Two times a day (BID) | ORAL | 0 refills | Status: AC
Start: 2024-03-03 — End: 2024-03-13
  Filled 2024-03-03 (×3): qty 20, 10d supply, fill #0

## 2024-03-03 MED ORDER — NORTRIPTYLINE HCL 10 MG PO CAPS
20.0000 mg | ORAL_CAPSULE | Freq: Every day | ORAL | 1 refills | Status: AC
  Filled 2024-03-04 – 2024-03-05 (×2): qty 180, 90d supply, fill #0
  Filled 2024-03-18 – 2024-05-19 (×5): qty 60, 30d supply, fill #0
  Filled 2024-06-17 – 2024-06-23 (×2): qty 60, 30d supply, fill #1
  Filled 2024-07-27: qty 60, 30d supply, fill #2
  Filled 2024-08-13 – 2024-08-19 (×3): qty 60, 30d supply, fill #3
  Filled 2024-09-21: qty 60, 30d supply, fill #4
  Filled 2024-10-21: qty 60, 30d supply, fill #5

## 2024-03-03 MED ORDER — PREGABALIN 150 MG PO CAPS
150.0000 mg | ORAL_CAPSULE | Freq: Three times a day (TID) | ORAL | 1 refills | Status: AC
  Filled 2024-03-03: qty 90, 30d supply, fill #0
  Filled 2024-05-31: qty 90, 30d supply, fill #1

## 2024-03-03 MED ORDER — TRAMADOL HCL 50 MG PO TABS
50.0000 mg | ORAL_TABLET | Freq: Three times a day (TID) | ORAL | 0 refills | Status: AC | PRN
  Filled 2024-03-03: qty 15, 5d supply, fill #0

## 2024-03-03 NOTE — Progress Notes (Unsigned)
 Name: Monique [name]   MRN: [id number]    DOB: [birth year]   Date:03/05/2024       Progress Note  Subjective  Chief Complaint  Chief Complaint  Patient presents with   Rib Injury    Pt believes could have fractured another rib on R side, pain is worst    Discussed the use of AI scribe software for clinical note transcription with the patient, who gave verbal consent to proceed.  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 06/05/2021   Lumbosacral radiculopathy/radiculitis at L5 (Bilateral) 06/05/2021   PAD (peripheral artery disease) (HCC) 02/14/2021   CIN II (cervical intraepithelial neoplasia II) 02/01/2021   S/P vaginal hysterectomy 01/22/2021   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/09/2020   Bilateral leg pain 11/03/2019   Iron  deficiency anemia 10/15/2019   Bilateral arm pain 08/05/2019   Bilateral hand numbness 08/05/2019   Weakness of both hands 08/05/2019   Chronic musculoskeletal pain 07/19/2019   Chronic neuropathic pain 07/19/2019   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   Special screening for malignant neoplasms, colon    Polyp of sigmoid colon    Hemorrhoids    Trigeminal neuralgia 10/05/2018   Lumbar radiculitis (Right) 09/24/2018   Hypocalcemia 06/03/2018   Vitamin D  insufficiency 06/03/2018   Abnormal MRI, lumbar spine (06/02/2020) 06/03/2018   Chronic hip pain (Right) 06/03/2018   Spondylosis without myelopathy or radiculopathy, lumbar region 06/03/2018   DDD (degenerative disc disease), lumbar 06/02/2018   Lumbar  facet hypertrophy 06/02/2018   Lumbar facet arthropathy 06/02/2018   Lumbar facet syndrome (Bilateral) (R>L) 06/02/2018   Marijuana use 05/19/2018   Chronic lower extremity pain (1ry area of Pain) (Bilateral) (R>L) 05/13/2018   Chronic low back pain (2ry area of Pain) (Bilateral) (R>L) w/ sciatica (Bilateral) 05/13/2018   Chronic pain syndrome 05/13/2018   Disorder of skeletal system 05/13/2018   Pharmacologic therapy 05/13/2018   Problems influencing health status 05/13/2018   Mastalgia 05/05/2018   Breast mass, right 01/07/2018   Face pain 12/11/2017   Tingling 12/11/2017   Thoracic aortic atherosclerosis (HCC) 08/20/2017   Emphysema of lung (HCC) 08/20/2017   Adductor tendinitis 04/23/2017   Trochanteric bursitis of right hip 04/23/2017   GERD without esophagitis 12/11/2016   CLL (chronic lymphocytic leukemia) (HCC) 11/24/2016   Stress incontinence 09/04/2016   B12 deficiency 07/10/2015   Insomnia 07/10/2015   Anorexia nervosa, restricting type 07/10/2015   Anxiety, generalized 07/10/2015   H/O suicide attempt 07/10/2015   Lymphocytosis 07/10/2015   Obsessive-compulsive disorder 07/10/2015   Tobacco use 07/10/2015   History of cervical dysplasia 07/18/2014    Social History   Tobacco Use   Smoking status: Every Day    Types: E-cigarettes   Smokeless tobacco: Never  Substance Use Topics   Alcohol use: Not Currently    Alcohol/week: 0.0 standard drinks of alcohol    Comment: rarely     Current Outpatient Medications:    albuterol  (VENTOLIN  HFA) 108 (90 Base) MCG/ACT inhaler, Inhale 2 puffs into the lungs every 6 (six) hours as needed for wheezing or  shortness of breath., Disp: 8 g, Rfl: 0   ARIPiprazole  (ABILIFY ) 5 MG tablet, Take 1 tablet (5 mg total) by mouth daily., Disp: 90 tablet, Rfl: 1   ASPIRIN  81 PO, Take 81 mg by mouth daily., Disp: , Rfl:    atorvastatin  (LIPITOR) 10 MG tablet, Take 1 tablet (10 mg total) by mouth daily., Disp: 100 tablet, Rfl: 3    baclofen  (LIORESAL ) 10 MG tablet, Take 1 tablet (10 mg total) by mouth 2 (two) times daily. Take 6 hours after morning tizanidine  dose, Disp: 30 each, Rfl: 0   Ca Phosphate-Cholecalciferol (CALTRATE GUMMY BITES) 250-10 MG-MCG CHEW, Chew 1 gummy by mouth 2 (two) times daily., Disp: 180 tablet, Rfl: 3   estrogens , conjugated, (PREMARIN ) 0.625 MG tablet, Take 1 tablet (0.625 mg total) by mouth daily for 21 days, then do not take for 7 days., Disp: 90 tablet, Rfl: 0   famotidine  (PEPCID ) 10 MG tablet, Take by mouth., Disp: , Rfl:    lenalidomide  (REVLIMID ) 10 MG capsule, TAKE 1 CAPSULE BY MOUTH EVERY DAY, Disp: 28 capsule, Rfl: 0   levocetirizine (XYZAL ) 5 MG tablet, Take 1 tablet (5 mg total) by mouth every evening., Disp: 30 tablet, Rfl: 1   lidocaine  (LIDODERM ) 5 %, Place 1 patch onto the skin daily. Remove & Discard patch within 12 hours or as directed by MD, Disp: 30 patch, Rfl: 0   loratadine  (CLARITIN ) 10 MG tablet, Take 1 tablet (10 mg total) by mouth daily in the morning., Disp: 90 tablet, Rfl: 1   nortriptyline  (PAMELOR ) 10 MG capsule, Take 1 tablet (10 mg total) by mouth at night for one week then increase to 20 mg at night, Disp: 60 capsule, Rfl: 3   nortriptyline  (PAMELOR ) 10 MG capsule, Take 2 capsules (20 mg total) by mouth daily., Disp: 180 capsule, Rfl: 1   omeprazole  (PRILOSEC) 20 MG capsule, Take 1 capsule (20 mg total) by mouth in the morning., Disp: 30 capsule, Rfl: 4   pregabalin  (LYRICA ) 150 MG capsule, Take 1 capsule (150 mg total) by mouth 3 (three) times daily., Disp: 90 capsule, Rfl: 1   pregabalin  (LYRICA ) 150 MG capsule, Take 1 capsule (150 mg total) by mouth 3 (three) times daily., Disp: 90 capsule, Rfl: 1   tiZANidine  (ZANAFLEX ) 2 MG tablet, Take 1 tablet (2 mg total) by mouth at bedtime., Disp: 90 tablet, Rfl: 1   traMADol  (ULTRAM ) 50 MG tablet, Take 1 tablet (50 mg total) by mouth every 8 (eight) hours as needed for up to 5 days., Disp: 15 tablet, Rfl: 0    umeclidinium-vilanterol (ANORO ELLIPTA ) 62.5-25 MCG/ACT AEPB, Inhale 1 puff into the lungs daily., Disp: 180 each, Rfl: 1   valACYclovir (VALTREX) 1000 MG tablet, Take 1 tablet (1,000 mg total) by mouth 2 (two) times daily for 10 days. For 24 hours prn, Disp: 20 tablet, Rfl: 0   vitamin B-12 (CYANOCOBALAMIN ) 500 MCG tablet, Take 250 mcg by mouth every morning., Disp: , Rfl:   Allergies  Allergen Reactions   Doxycycline  Rash    Rash on cheeks and sides of nose    Duloxetine  Rash    ROS  Ten systems reviewed and is negative except as mentioned in HPI    Objective  Vitals:   03/03/24 1300  BP: 104/68  Pulse: 87  Resp: 16  SpO2: 98%  Weight: 174 lb 12.8 oz (79.3 kg)  Height: 5' 7 (1.702 m)    Body mass index is 27.38 kg/m.  Physical Exam  Constitutional:  Patient appears well-developed and well-nourished.  No distress.  HEENT: head atraumatic, normocephalic, pupils equal and reactive to light, neck supple, throat within normal limits, fever blister on right upper lip , looks like a scab now  Cardiovascular: Normal rate, regular rhythm and normal heart sounds.  No murmur heard. No BLE edema. Tender during palpation of right side of  rib cage  Pulmonary/Chest: Effort normal and breath sounds normal. No respiratory distress. Psychiatric: Patient has a normal mood and affect. behavior is normal. Judgment and thought content normal.     Recent Results (from the past 2160 hours)  Comprehensive metabolic panel     Status: Abnormal   Collection Time: 12/09/23 10:34 AM  Result Value Ref Range   Sodium 140 135 - 145 mmol/L   Potassium 4.2 3.5 - 5.1 mmol/L   Chloride 108 98 - 111 mmol/L   CO2 24 22 - 32 mmol/L   Glucose, Bld 94 70 - 99 mg/dL    Comment: Glucose reference range applies only to samples taken after fasting for at least 8 hours.   BUN 16 6 - 20 mg/dL   Creatinine, Ser 0.98 (H) 0.44 - 1.00 mg/dL   Calcium  9.0 8.9 - 10.3 mg/dL   Total Protein 6.5 6.5 - 8.1 g/dL    Albumin 3.7 3.5 - 5.0 g/dL   AST 17 15 - 41 U/L   ALT 17 0 - 44 U/L   Alkaline Phosphatase 96 38 - 126 U/L   Total Bilirubin 0.5 0.0 - 1.2 mg/dL   GFR, Estimated 59 (L) >60 mL/min    Comment: (NOTE) Calculated using the CKD-EPI Creatinine Equation (2021)    Anion gap 8 5 - 15    Comment: Performed at [facility], [address] [place], [place] [address]  CBC with Differential/Platelet     Status: Abnormal   Collection Time: 12/09/23 10:34 AM  Result Value Ref Range   WBC 2.8 (L) 4.0 - 10.5 K/uL   RBC 2.78 (L) 3.87 - 5.11 MIL/uL   Hemoglobin 10.0 (L) 12.0 - 15.0 g/dL   HCT 78.2 (L) 95.6 - 21.3 %   MCV 106.5 (H) 80.0 - 100.0 fL   MCH 36.0 (H) 26.0 - 34.0 pg   MCHC 33.8 30.0 - 36.0 g/dL   RDW 08.6 57.8 - 46.9 %   Platelets 80 (L) 150 - 400 K/uL   nRBC 0.0 0.0 - 0.2 %   Neutrophils Relative % 38 %   Neutro Abs 1.1 (L) 1.7 - 7.7 K/uL   Lymphocytes Relative 49 %   Lymphs Abs 1.4 0.7 - 4.0 K/uL   Monocytes Relative 10 %   Monocytes Absolute 0.3 0.1 - 1.0 K/uL   Eosinophils Relative 2 %   Eosinophils Absolute 0.1 0.0 - 0.5 K/uL   Basophils Relative 0 %   Basophils Absolute 0.0 0.0 - 0.1 K/uL   WBC Morphology      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   RBC Morphology MORPHOLOGY UNREMARKABLE    Smear Review Normal platelet morphology     Comment: PLATELETS APPEAR DECREASED   Immature Granulocytes 1 %   Abs Immature Granulocytes 0.03 0.00 - 0.07 K/uL    Comment: Performed at [facility], [address] [place], [place] [address]  CMP (Cancer Center only)     Status: Abnormal   Collection Time: 12/16/23  8:51 AM  Result Value Ref Range   Sodium 138 135 - 145 mmol/L   Potassium  3.9 3.5 - 5.1 mmol/L   Chloride 108 98 - 111 mmol/L   CO2 23 22 - 32 mmol/L   Glucose, Bld 96 70 - 99 mg/dL    Comment: Glucose reference range applies only to samples taken after fasting for at least 8 hours.   BUN 12 6 - 20 mg/dL   Creatinine 6.57 (H)  0.44 - 1.00 mg/dL   Calcium  8.6 (L) 8.9 - 10.3 mg/dL   Total Protein 6.1 (L) 6.5 - 8.1 g/dL   Albumin 3.8 3.5 - 5.0 g/dL   AST 17 15 - 41 U/L   ALT 14 0 - 44 U/L   Alkaline Phosphatase 93 38 - 126 U/L   Total Bilirubin 0.7 0.0 - 1.2 mg/dL   GFR, Estimated 55 (L) >60 mL/min    Comment: (NOTE) Calculated using the CKD-EPI Creatinine Equation (2021)    Anion gap 7 5 - 15    Comment: Performed at [facility], [address] [place], [place] [address]  CBC with Differential/Platelet     Status: Abnormal   Collection Time: 12/16/23  8:51 AM  Result Value Ref Range   WBC 4.5 4.0 - 10.5 K/uL   RBC 2.99 (L) 3.87 - 5.11 MIL/uL   Hemoglobin 10.7 (L) 12.0 - 15.0 g/dL   HCT 29.5 (L) 28.4 - 13.2 %   MCV 105.7 (H) 80.0 - 100.0 fL   MCH 35.8 (H) 26.0 - 34.0 pg   MCHC 33.9 30.0 - 36.0 g/dL   RDW 44.0 (H) 10.2 - 72.5 %   Platelets 74 (L) 150 - 400 K/uL   nRBC 0.0 0.0 - 0.2 %   Neutrophils Relative % 57 %   Neutro Abs 2.6 1.7 - 7.7 K/uL   Lymphocytes Relative 33 %   Lymphs Abs 1.5 0.7 - 4.0 K/uL   Monocytes Relative 8 %   Monocytes Absolute 0.3 0.1 - 1.0 K/uL   Eosinophils Relative 1 %   Eosinophils Absolute 0.1 0.0 - 0.5 K/uL   Basophils Relative 0 %   Basophils Absolute 0.0 0.0 - 0.1 K/uL   Immature Granulocytes 1 %   Abs Immature Granulocytes 0.04 0.00 - 0.07 K/uL    Comment: Performed at [facility], [address] [place], [place] [address]  CMP (Cancer Center only)     Status: Abnormal   Collection Time: 01/13/24  9:09 AM  Result Value Ref Range   Sodium 135 135 - 145 mmol/L   Potassium 4.1 3.5 - 5.1 mmol/L   Chloride 101 98 - 111 mmol/L   CO2 25 22 - 32 mmol/L   Glucose, Bld 76 70 - 99 mg/dL    Comment: Glucose reference range applies only to samples taken after fasting for at least 8 hours.   BUN 16 6 - 20 mg/dL   Creatinine 0.34 (H) 7.42 - 1.00 mg/dL   Calcium  9.4 8.9 - 10.3 mg/dL   Total Protein 6.1 (L) 6.5 - 8.1 g/dL   Albumin 4.0 3.5 - 5.0 g/dL   AST  23 15 - 41 U/L   ALT 24 0 - 44 U/L   Alkaline Phosphatase 90 38 - 126 U/L   Total Bilirubin 1.5 (H) 0.0 - 1.2 mg/dL   GFR, Estimated 45 (L) >60 mL/min    Comment: (NOTE) Calculated using the CKD-EPI Creatinine Equation (2021)    Anion gap 9 5 - 15    Comment: Performed at [facility], [address], [place], [place] [address]  CBC with Differential/Platelet     Status: Abnormal   Collection Time: 01/13/24  9:09 AM  Result Value Ref Range   WBC 2.7 (L) 4.0 - 10.5 K/uL   RBC 3.35 (L) 3.87 - 5.11 MIL/uL   Hemoglobin 12.1 12.0 - 15.0 g/dL   HCT 16.1 09.6 - 04.5 %   MCV 108.1 (H) 80.0 - 100.0 fL   MCH 36.1 (H) 26.0 - 34.0 pg   MCHC 33.4 30.0 - 36.0 g/dL   RDW 40.9 81.1 - 91.4 %   Platelets 68 (L) 150 - 400 K/uL   nRBC 0.0 0.0 - 0.2 %   Neutrophils Relative % 34 %   Neutro Abs 0.9 (L) 1.7 - 7.7 K/uL   Lymphocytes Relative 54 %   Lymphs Abs 1.5 0.7 - 4.0 K/uL   Monocytes Relative 7 %   Monocytes Absolute 0.2 0.1 - 1.0 K/uL   Eosinophils Relative 3 %   Eosinophils Absolute 0.1 0.0 - 0.5 K/uL   Basophils Relative 1 %   Basophils Absolute 0.0 0.0 - 0.1 K/uL   WBC Morphology Mild Left Shift (1-5% metas, occ myelo)     Comment: SMUDGE CELLS   RBC Morphology UNREMARKABLE    Smear Review Normal platelet morphology     Comment: PLATELETS APPEAR DECREASED   Immature Granulocytes 1 %   Abs Immature Granulocytes 0.03 0.00 - 0.07 K/uL    Comment: Performed at [facility], [address] [place], [place] [address]  D-dimer, quantitative     Status: Abnormal   Collection Time: 01/21/24  9:35 AM  Result Value Ref Range   D-Dimer, Quant 0.86 (H) 0.00 - 0.50 ug/mL-FEU    Comment: (NOTE) At the manufacturer cut-off value of 0.5 g/mL FEU, this assay has a negative predictive value of 95-100%.This assay is intended for use in conjunction with a clinical pretest probability (PTP) assessment model to exclude pulmonary embolism (PE) and deep venous thrombosis (DVT) in  outpatients suspected of PE or DVT. Results should be correlated with clinical presentation. Performed at [facility], [address] [place], [place] [address]   Glucose, capillary     Status: Abnormal   Collection Time: 02/03/24  9:15 AM  Result Value Ref Range   Glucose-Capillary 108 (H) 70 - 99 mg/dL    Comment: Glucose reference range applies only to samples taken after fasting for at least 8 hours.  Lipase, blood     Status: None   Collection Time: 02/03/24  1:42 PM  Result Value Ref Range   Lipase 31 11 - 51 U/L    Comment: Performed at [facility], [address] [place], [place] [address]  Comprehensive metabolic panel     Status: Abnormal   Collection Time: 02/03/24  1:42 PM  Result Value Ref Range   Sodium 137 135 - 145 mmol/L   Potassium 3.2 (L) 3.5 - 5.1 mmol/L   Chloride 102 98 - 111 mmol/L   CO2 22 22 - 32 mmol/L   Glucose, Bld 147 (H) 70 - 99 mg/dL    Comment: Glucose reference range applies only to samples taken after fasting for at least 8 hours.   BUN 16 6 - 20 mg/dL   Creatinine, Ser 4.69 (H) 0.44 - 1.00 mg/dL   Calcium  10.0 8.9 - 10.3 mg/dL   Total Protein 5.9 (L) 6.5 - 8.1 g/dL   Albumin 3.6 3.5 - 5.0 g/dL   AST 23 15 - 41 U/L   ALT 17  0 - 44 U/L   Alkaline Phosphatase 78 38 - 126 U/L   Total Bilirubin 1.2 0.0 - 1.2 mg/dL   GFR, Estimated 35 (L) >60 mL/min    Comment: (NOTE) Calculated using the CKD-EPI Creatinine Equation (2021)    Anion gap 13 5 - 15    Comment: Performed at [facility], [address] [place], [place] [address]  CBC     Status: Abnormal   Collection Time: 02/03/24  1:42 PM  Result Value Ref Range   WBC 3.3 (L) 4.0 - 10.5 K/uL   RBC 3.47 (L) 3.87 - 5.11 MIL/uL   Hemoglobin 12.4 12.0 - 15.0 g/dL   HCT 84.1 32.4 - 40.1 %   MCV 105.2 (H) 80.0 - 100.0 fL   MCH 35.7 (H) 26.0 - 34.0 pg   MCHC 34.0 30.0 - 36.0 g/dL   RDW 02.7 25.3 - 66.4 %   Platelets 44 (L) 150 - 400 K/uL    Comment: Immature  Platelet Fraction may be clinically indicated, consider ordering this additional test [id number]    nRBC 0.0 0.0 - 0.2 %    Comment: Performed at [facility], [address] [place], [place] [address]  CBC with Differential (Cancer Center Only)     Status: Abnormal   Collection Time: 02/12/24  9:49 AM  Result Value Ref Range   WBC Count 4.6 4.0 - 10.5 K/uL   RBC 3.50 (L) 3.87 - 5.11 MIL/uL   Hemoglobin 12.3 12.0 - 15.0 g/dL   HCT 75.6 43.3 - 29.5 %   MCV 104.0 (H) 80.0 - 100.0 fL   MCH 35.1 (H) 26.0 - 34.0 pg   MCHC 33.8 30.0 - 36.0 g/dL   RDW 18.8 41.6 - 60.6 %   Platelet Count 52 (L) 150 - 400 K/uL   nRBC 0.0 0.0 - 0.2 %   Neutrophils Relative % 16 %   Neutro Abs 0.8 (L) 1.7 - 7.7 K/uL   Lymphocytes Relative 72 %   Lymphs Abs 3.3 0.7 - 4.0 K/uL   Monocytes Relative 6 %   Monocytes Absolute 0.3 0.1 - 1.0 K/uL   Eosinophils Relative 4 %   Eosinophils Absolute 0.2 0.0 - 0.5 K/uL   Basophils Relative 2 %   Basophils Absolute 0.1 0.0 - 0.1 K/uL   WBC Morphology DIFF. CONFIRMED BY SMEAR    RBC Morphology MORPHOLOGY UNREMARKABLE    Smear Review Normal platelet morphology     Comment: PLATELETS APPEAR DECREASED   Immature Granulocytes 0 %   Abs Immature Granulocytes 0.02 0.00 - 0.07 K/uL    Comment: Performed at [facility], [address] [place], [place] [address]  CMP (Cancer Center only)     Status: Abnormal   Collection Time: 02/12/24  9:49 AM  Result Value Ref Range   Sodium 136 135 - 145 mmol/L   Potassium 4.0 3.5 - 5.1 mmol/L   Chloride 104 98 - 111 mmol/L   CO2 24 22 - 32 mmol/L   Glucose, Bld 95 70 - 99 mg/dL    Comment: Glucose reference range applies only to samples taken after fasting for at least 8 hours.   BUN 20 6 - 20 mg/dL   Creatinine 1.09 (H) 3.23 - 1.00 mg/dL   Calcium  8.4 (L) 8.9 - 10.3 mg/dL   Total Protein 6.1 (L) 6.5 - 8.1 g/dL   Albumin 3.6 3.5 - 5.0 g/dL   AST 19 15 - 41 U/L   ALT 22  0 - 44 U/L   Alkaline Phosphatase 86 38 -  126 U/L   Total Bilirubin 0.8 0.0 - 1.2 mg/dL   GFR, Estimated >16 >10 mL/min    Comment: (NOTE) Calculated using the CKD-EPI Creatinine Equation (2021)    Anion gap 8 5 - 15    Comment: Performed at [facility], [address], [place], [place] [address]      Assessment & Plan    ***

## 2024-03-04 ENCOUNTER — Other Ambulatory Visit (HOSPITAL_COMMUNITY): Payer: Self-pay

## 2024-03-05 ENCOUNTER — Other Ambulatory Visit: Payer: Self-pay

## 2024-03-05 DIAGNOSIS — M7502 Adhesive capsulitis of left shoulder: Secondary | ICD-10-CM | POA: Diagnosis not present

## 2024-03-05 DIAGNOSIS — E10618 Type 1 diabetes mellitus with other diabetic arthropathy: Secondary | ICD-10-CM | POA: Diagnosis not present

## 2024-03-08 ENCOUNTER — Other Ambulatory Visit: Payer: Self-pay | Admitting: Oncology

## 2024-03-08 ENCOUNTER — Other Ambulatory Visit: Payer: Self-pay

## 2024-03-08 ENCOUNTER — Other Ambulatory Visit (HOSPITAL_COMMUNITY): Payer: Self-pay

## 2024-03-08 ENCOUNTER — Encounter: Payer: Self-pay | Admitting: Oncology

## 2024-03-08 DIAGNOSIS — C9110 Chronic lymphocytic leukemia of B-cell type not having achieved remission: Secondary | ICD-10-CM

## 2024-03-09 ENCOUNTER — Other Ambulatory Visit: Payer: Self-pay

## 2024-03-09 ENCOUNTER — Encounter: Payer: Self-pay | Admitting: Oncology

## 2024-03-09 ENCOUNTER — Ambulatory Visit
Admission: RE | Admit: 2024-03-09 | Discharge: 2024-03-09 | Disposition: A | Discharge status: Home / Self Care | Source: Ambulatory Visit | Attending: Family Medicine | Admitting: Family Medicine

## 2024-03-09 DIAGNOSIS — Z13820 Encounter for screening for osteoporosis: Secondary | ICD-10-CM | POA: Diagnosis not present

## 2024-03-09 DIAGNOSIS — M85851 Other specified disorders of bone density and structure, right thigh: Secondary | ICD-10-CM | POA: Diagnosis not present

## 2024-03-09 DIAGNOSIS — M8589 Other specified disorders of bone density and structure, multiple sites: Secondary | ICD-10-CM | POA: Diagnosis not present

## 2024-03-09 DIAGNOSIS — Z780 Asymptomatic menopausal state: Secondary | ICD-10-CM | POA: Diagnosis not present

## 2024-03-09 DIAGNOSIS — M85852 Other specified disorders of bone density and structure, left thigh: Secondary | ICD-10-CM | POA: Diagnosis not present

## 2024-03-10 ENCOUNTER — Telehealth: Payer: Self-pay

## 2024-03-10 ENCOUNTER — Other Ambulatory Visit: Payer: Self-pay

## 2024-03-10 DIAGNOSIS — C9110 Chronic lymphocytic leukemia of B-cell type not having achieved remission: Secondary | ICD-10-CM

## 2024-03-10 NOTE — Telephone Encounter (Signed)
 Pt notified, Dr. Glenard out of the office, and then [place].  Would still like for Dr. Glenard to review and let her know if she needs an appt or just supplements which already takes calcium ?

## 2024-03-10 NOTE — Telephone Encounter (Signed)
 It says osteopenia? No meds at this time just do calcium  supplement

## 2024-03-10 NOTE — Telephone Encounter (Signed)
 Copied from CRM [id number]. Topic: General - Other >> Mar 10, 2024 11:17 AM Emylou G wrote: Reason for CRM: Patient called wanting results of bone density

## 2024-03-11 ENCOUNTER — Inpatient Hospital Stay: Attending: Oncology

## 2024-03-11 ENCOUNTER — Other Ambulatory Visit (HOSPITAL_COMMUNITY): Payer: Self-pay

## 2024-03-11 ENCOUNTER — Ambulatory Visit: Payer: Self-pay | Admitting: Family Medicine

## 2024-03-11 ENCOUNTER — Inpatient Hospital Stay: Admitting: Pharmacist

## 2024-03-11 ENCOUNTER — Encounter: Payer: Self-pay | Admitting: Oncology

## 2024-03-11 ENCOUNTER — Other Ambulatory Visit: Payer: Self-pay | Admitting: Family Medicine

## 2024-03-11 ENCOUNTER — Other Ambulatory Visit: Payer: Self-pay

## 2024-03-11 ENCOUNTER — Inpatient Hospital Stay (HOSPITAL_BASED_OUTPATIENT_CLINIC_OR_DEPARTMENT_OTHER): Admitting: Oncology

## 2024-03-11 VITALS — BP 101/68 | HR 85 | Temp 97.5°F | Resp 16 | Ht 67.0 in | Wt 173.8 lb

## 2024-03-11 DIAGNOSIS — D696 Thrombocytopenia, unspecified: Secondary | ICD-10-CM | POA: Insufficient documentation

## 2024-03-11 DIAGNOSIS — Z7961 Long term (current) use of immunomodulator: Secondary | ICD-10-CM | POA: Insufficient documentation

## 2024-03-11 DIAGNOSIS — G629 Polyneuropathy, unspecified: Secondary | ICD-10-CM | POA: Insufficient documentation

## 2024-03-11 DIAGNOSIS — N898 Other specified noninflammatory disorders of vagina: Secondary | ICD-10-CM | POA: Insufficient documentation

## 2024-03-11 DIAGNOSIS — R252 Cramp and spasm: Secondary | ICD-10-CM | POA: Insufficient documentation

## 2024-03-11 DIAGNOSIS — C9110 Chronic lymphocytic leukemia of B-cell type not having achieved remission: Secondary | ICD-10-CM

## 2024-03-11 DIAGNOSIS — N289 Disorder of kidney and ureter, unspecified: Secondary | ICD-10-CM | POA: Diagnosis not present

## 2024-03-11 DIAGNOSIS — Z923 Personal history of irradiation: Secondary | ICD-10-CM | POA: Insufficient documentation

## 2024-03-11 DIAGNOSIS — D709 Neutropenia, unspecified: Secondary | ICD-10-CM | POA: Insufficient documentation

## 2024-03-11 LAB — CBC WITH DIFFERENTIAL (CANCER CENTER ONLY)
Abs Immature Granulocytes: 0.03 10*3/uL (ref 0.00–0.07)
Basophils Absolute: 0 10*3/uL (ref 0.0–0.1)
Basophils Relative: 1 %
Eosinophils Absolute: 0.1 10*3/uL (ref 0.0–0.5)
Eosinophils Relative: 2 %
HCT: 34.9 % — ABNORMAL LOW (ref 36.0–46.0)
Hemoglobin: 12.1 g/dL (ref 12.0–15.0)
Immature Granulocytes: 1 %
Lymphocytes Relative: 76 %
Lymphs Abs: 2.6 10*3/uL (ref 0.7–4.0)
MCH: 35.7 pg — ABNORMAL HIGH (ref 26.0–34.0)
MCHC: 34.7 g/dL (ref 30.0–36.0)
MCV: 102.9 fL — ABNORMAL HIGH (ref 80.0–100.0)
Monocytes Absolute: 0.2 10*3/uL (ref 0.1–1.0)
Monocytes Relative: 6 %
Neutro Abs: 0.5 10*3/uL — ABNORMAL LOW (ref 1.7–7.7)
Neutrophils Relative %: 14 %
Platelet Count: 45 10*3/uL — ABNORMAL LOW (ref 150–400)
RBC: 3.39 MIL/uL — ABNORMAL LOW (ref 3.87–5.11)
RDW: 13.6 % (ref 11.5–15.5)
Smear Review: NORMAL
WBC Count: 3.5 10*3/uL — ABNORMAL LOW (ref 4.0–10.5)
nRBC: 0 % (ref 0.0–0.2)

## 2024-03-11 LAB — CMP (CANCER CENTER ONLY)
ALT: 39 U/L (ref 0–44)
AST: 23 U/L (ref 15–41)
Albumin: 3.9 g/dL (ref 3.5–5.0)
Alkaline Phosphatase: 99 U/L (ref 38–126)
Anion gap: 10 (ref 5–15)
BUN: 20 mg/dL (ref 6–20)
CO2: 25 mmol/L (ref 22–32)
Calcium: 9.6 mg/dL (ref 8.9–10.3)
Chloride: 103 mmol/L (ref 98–111)
Creatinine: 1.37 mg/dL — ABNORMAL HIGH (ref 0.44–1.00)
GFR, Estimated: 46 mL/min — ABNORMAL LOW (ref >60)
Glucose, Bld: 120 mg/dL — ABNORMAL HIGH (ref 70–99)
Potassium: 4.2 mmol/L (ref 3.5–5.1)
Sodium: 138 mmol/L (ref 135–145)
Total Bilirubin: 1.1 mg/dL (ref 0.0–1.2)
Total Protein: 6.3 g/dL — ABNORMAL LOW (ref 6.5–8.1)

## 2024-03-11 MED ORDER — ARIPIPRAZOLE 5 MG PO TABS
5.0000 mg | ORAL_TABLET | Freq: Every day | ORAL | 0 refills | Status: DC
  Filled 2024-03-18: qty 30, 30d supply, fill #0
  Filled 2024-04-20 (×2): qty 30, 30d supply, fill #1

## 2024-03-11 MED ORDER — TIZANIDINE HCL 2 MG PO TABS
2.0000 mg | ORAL_TABLET | Freq: Every day | ORAL | 0 refills | Status: DC
  Filled 2024-03-18: qty 30, 30d supply, fill #0
  Filled 2024-04-20 (×2): qty 30, 30d supply, fill #1

## 2024-03-11 MED FILL — Omeprazole Cap Delayed Release 20 MG: ORAL | 30 days supply | Qty: 30 | Fill #0 | Status: CN

## 2024-03-11 NOTE — Progress Notes (Signed)
 Clinical Pharmacist Practitioner Clinic [facility]  [phone] [phone]  Patient Care Team: Sowles, Krichna, MD as PCP - General (Family Medicine) Jacobo Evalene PARAS, MD as Consulting Physician (Oncology) [name], [facility] (Inactive) as Pharmacist (Pharmacist) [name], RN as Registered Nurse (Oncology) Borders, Fonda SAUNDERS, NP as Nurse Practitioner (Hospice and Palliative Medicine) Lenn Aran, MD as Consulting Physician (Radiation Oncology) Pa, [facility]   Name of the patient: Monique [name]  [id number]  Feb 20, 1971   Date of visit: 03/11/24  HPI: Patient is a 53 y.o. female with progressive CLL, previously treated with ibrutinib . Patient was intolerant to venetoclax  treatment initiation and did not want to retry initiating venetoclax . The plan is to now treat her with rituximab  and lenalidomide . Patient started rituximab  on 02/28/22 and lenalidomide  03/01/22.   On 04/03/22, patient presented to clinic with a rash. Her lenalidomide  was held and she was treated with prednisone  (1 week) and loratadine  continuosly. Rash improved by 04/10/22 f/u appt and she was restarted on lenalidomide .   On 04/28/22, patient called to report her rash had worsened and she stopped the lenalidomide  on 04/23/22. She was stated on another prednisone  taper. She was to taper down to 5mg  daily, then resume her lenalidomide . She resumed her lenalidomide  on 05/16/22. Patient stopped rituximab  after receiving 16 cycles, last dose 05/07/23.   Patient's lenalidomide  was held on 11/17/23 due to neutropenia and thrombocytopenia. Patient's counts have recovered and she resumed treatment on 12/17/23.  Reason for Consult: Oral chemotherapy follow-up for lenalidomide  therapy.   PAST MEDICAL HISTORY: Past Medical History:  Diagnosis Date   Anxiety    Bursitis    leg pain   Carpal tunnel syndrome    Cervical dysplasia    hx LEEP over 18 years ago.    Chronic  lymphocytic leukemia (HCC) 2018   Dr Jacobo.  (in lymph nodes)   COPD (chronic obstructive pulmonary disease) (HCC)    COVID-19 2021   Depression    Eating disorder    GERD (gastroesophageal reflux disease)    History of self-harm    Insomnia    Obsession    Tobacco abuse    Vitamin B12 deficiency (non anemic)     HEMATOLOGY/ONCOLOGY HISTORY:  Oncology History Overview Note  Patient with recent CLL diagnosis by flow cytometry.  Previously not being treated in observation.   S/p 4 cycles of weekly Rituxan  completing on 02/20/2017 scans from January 2018 revealed minimal progression.   Developed right breast mass in March 2019.  Had diagnostic mammogram performed on 12/26/2017 showed suspicious right breast mass.  Biopsy revealed granulomatous mastitis.  She was treated with doxycycline  100 mg twice daily for 7 days.   Developed worsening adenopathy.  CT chest/abdomen/pelvis and soft tissue neck from May 2019 revealed progressive bulky adenopathy in the chest, abdomen and pelvis compatible with worsening disease.   Dr. Jacobo recommended beginning treatment with Rituxan  and Treanda .  Received first cycle last week.  S/p 6 cycles of Rituxan  and Treanda .  Tolerating well.  Last dose given 03/25/18.   CLL (chronic lymphocytic leukemia) (HCC)  11/24/2016 Initial Diagnosis   CLL (chronic lymphocytic leukemia) (HCC)   01/21/2018 - 03/26/2018 Chemotherapy   The patient had palonosetron  (ALOXI ) injection 0.25 mg, 0.25 mg, Intravenous,  Once, 4 of 5 cycles Administration: 0.25 mg (01/28/2018), 0.25 mg (02/25/2018), 0.25 mg (03/25/2018) riTUXimab  (RITUXAN ) 700 mg in sodium chloride  0.9 % 250 mL (2.1875 mg/mL) infusion, 375 mg/m2 = 700 mg, Intravenous,  Once, 4 of 5 cycles Administration: 700 mg (01/28/2018) bendamustine  (BENDEKA ) 150 mg in sodium chloride  0.9 % 50 mL (2.6786 mg/mL) chemo infusion, 90 mg/m2 = 150 mg, Intravenous,  Once, 4 of 5 cycles Administration: 150 mg (01/28/2018), 150 mg  (01/29/2018), 150 mg (02/25/2018), 150 mg (02/26/2018), 150 mg (03/25/2018), 150 mg (03/26/2018)  for chemotherapy treatment.    02/28/2022 - 04/18/2022 Chemotherapy   Patient is on Treatment Plan : NON-HODGKINS LYMPHOMA Rituximab  q7d     02/28/2022 - 05/07/2023 Chemotherapy   Patient is on Treatment Plan : NON-HODGKINS LYMPHOMA Rituximab  q28d     03/12/2022 - 03/12/2022 Chemotherapy   Patient is on Treatment Plan : LYMPHOMA CLL/SLL Venetoclax  ramp up / Venetoclax  + Rituximab  (375/500) q28d x 6 cycles / Venetoclax  q28d x 18 cycles       ALLERGIES:  is allergic to doxycycline  and duloxetine .  MEDICATIONS:  Current Outpatient Medications  Medication Sig Dispense Refill   albuterol  (VENTOLIN  HFA) 108 (90 Base) MCG/ACT inhaler Inhale 2 puffs into the lungs every 6 (six) hours as needed for wheezing or shortness of breath. 8 g 0   ARIPiprazole  (ABILIFY ) 5 MG tablet Take 1 tablet (5 mg total) by mouth daily. 90 tablet 0   ASPIRIN  81 PO Take 81 mg by mouth daily.     atorvastatin  (LIPITOR) 10 MG tablet Take 1 tablet (10 mg total) by mouth daily. 100 tablet 3   baclofen  (LIORESAL ) 10 MG tablet Take 1 tablet (10 mg total) by mouth 2 (two) times daily. Take 6 hours after morning tizanidine  dose 30 each 0   Ca Phosphate-Cholecalciferol (CALTRATE GUMMY BITES) 250-10 MG-MCG CHEW Chew 1 gummy by mouth 2 (two) times daily. 180 tablet 3   estrogens , conjugated, (PREMARIN ) 0.625 MG tablet Take 1 tablet (0.625 mg total) by mouth daily for 21 days, then do not take for 7 days. 90 tablet 0   famotidine  (PEPCID ) 10 MG tablet Take by mouth.     lenalidomide  (REVLIMID ) 10 MG capsule TAKE 1 CAPSULE BY MOUTH EVERY DAY 28 capsule 0   levocetirizine (XYZAL ) 5 MG tablet Take 1 tablet (5 mg total) by mouth every evening. 30 tablet 1   lidocaine  (LIDODERM ) 5 % Place 1 patch onto the skin daily. Remove & Discard patch within 12 hours or as directed by MD 30 patch 0   loratadine  (CLARITIN ) 10 MG tablet Take 1 tablet (10 mg total)  by mouth daily in the morning. 90 tablet 1   nortriptyline  (PAMELOR ) 10 MG capsule Take 1 tablet (10 mg total) by mouth at night for one week then increase to 20 mg at night 60 capsule 3   nortriptyline  (PAMELOR ) 10 MG capsule Take 2 capsules (20 mg total) by mouth daily. 180 capsule 1   omeprazole  (PRILOSEC) 20 MG capsule Take 1 capsule (20 mg total) by mouth in the morning. 30 capsule 1   pregabalin  (LYRICA ) 150 MG capsule Take 1 capsule (150 mg total) by mouth 3 (three) times daily. 90 capsule 1   pregabalin  (LYRICA ) 150 MG capsule Take 1 capsule (150 mg total) by mouth 3 (three) times daily. 90 capsule 1   tiZANidine  (ZANAFLEX ) 2 MG tablet Take 1 tablet (2 mg total) by mouth at bedtime. 90 tablet 0   umeclidinium-vilanterol (ANORO ELLIPTA ) 62.5-25 MCG/ACT AEPB Inhale 1 puff into the lungs daily. 180 each 1   valACYclovir  (VALTREX ) 1000 MG tablet Take 1 tablet (1,000 mg total) by mouth 2 (two) times daily for 10 days. For 24 hours  prn 20 tablet 0   vitamin B-12 (CYANOCOBALAMIN ) 500 MCG tablet Take 250 mcg by mouth every morning.     No current facility-administered medications for this visit.    VITAL SIGNS: LMP 08/17/2016  There were no vitals filed for this visit.  Estimated body mass index is 27.22 kg/m as calculated from the following:   Height as of an earlier encounter on 03/11/24: 5' 7 (1.702 m).   Weight as of an earlier encounter on 03/11/24: 78.8 kg (173 lb 12.8 oz).  LABS: CBC:    Component Value Date/Time   WBC 3.5 (L) 03/11/2024 1047   WBC 3.3 (L) 02/03/2024 1342   HGB 12.1 03/11/2024 1047   HCT 34.9 (L) 03/11/2024 1047   PLT 45 (L) 03/11/2024 1047   MCV 102.9 (H) 03/11/2024 1047   NEUTROABS 0.5 (L) 03/11/2024 1047   LYMPHSABS 2.6 03/11/2024 1047   MONOABS 0.2 03/11/2024 1047   EOSABS 0.1 03/11/2024 1047   BASOSABS 0.0 03/11/2024 1047   Comprehensive Metabolic Panel:    Component Value Date/Time   NA 138 03/11/2024 1047   NA 138 05/13/2018 1413   K 4.2  03/11/2024 1047   CL 103 03/11/2024 1047   CO2 25 03/11/2024 1047   BUN 20 03/11/2024 1047   BUN 15 05/13/2018 1413   CREATININE 1.37 (H) 03/11/2024 1047   CREATININE 0.96 12/06/2020 1646   GLUCOSE 120 (H) 03/11/2024 1047   CALCIUM  9.6 03/11/2024 1047   AST 23 03/11/2024 1047   ALT 39 03/11/2024 1047   ALKPHOS 99 03/11/2024 1047   BILITOT 1.1 03/11/2024 1047   PROT 6.3 (L) 03/11/2024 1047   PROT 6.3 05/13/2018 1413   ALBUMIN 3.9 03/11/2024 1047   ALBUMIN 4.1 05/13/2018 1413     Present during today's visit: patient and her mom  Assessment and Plan: CMP/CBC reviewed, continue lenalidomide  10mg  daily.  Will continue to monitor ANC and plts closely  Other: patient is seeing neurology for management of hand cramping     Oral Chemotherapy Adherence: No missed doses reported No patient barriers to medication adherence identified.   New medications: None reported  Medication Access Issues: No issues, patient fills medication at [facility]  Patient expressed understanding and was in agreement with this plan. She also understands that She can call clinic at any time with any questions, concerns, or complaints.   Follow-up plan: RTC as scheduled  Thank you for allowing me to participate in the care of this very pleasant patient.   Time Total: 15 mins  Visit consisted of counseling and education on dealing with issues of symptom management in the setting of serious and potentially life-threatening illness.Greater than 50%  of this time was spent counseling and coordinating care related to the above assessment and plan.  Signed by: Quetzaly Ebner N. Thinh Cuccaro, PharmD, NEILA, CPP Hematology/Oncology Clinical Pharmacist Practitioner [facility] [phone]  03/11/2024 3:36 PM

## 2024-03-11 NOTE — Progress Notes (Signed)
 [facility]  [phone] [phone]  ID: Monique [name] OB: October 31, 1970  MR#: [id number]  RDW#:[id number]  Patient Care Team: Sowles, Krichna, MD as PCP - General (Family Medicine) Monique Monique PARAS, MD as Consulting Physician (Oncology) [name], [facility] (Inactive) as Pharmacist (Pharmacist) [name], RN as Registered Nurse (Oncology) Borders, Fonda SAUNDERS, NP as Nurse Practitioner (Hospice and Palliative Medicine) Lenn Aran, MD as Consulting Physician (Radiation Oncology) Pa, [facility]  CHIEF COMPLAINT: CLL.  INTERVAL HISTORY: Patient returns to clinic today for repeat laboratory work, further evaluation, and continuation of Revlimid .  She is accompanied by her mother today.  She currently feels well and is at her baseline.  She denies any recent fevers or illnesses.  She continues to have a chronic peripheral neuropathy and hand cramping. She has no other neurologic complaints.  She has a good appetite and her weight has remained stable.  She has no chest pain, shortness of breath, cough, or hemoptysis.  She denies any nausea, vomiting, constipation, or diarrhea.  She has no melena or hematochezia.  She has no urinary complaints.  Patient offers no further specific complaints today.  REVIEW OF SYSTEMS:   Review of Systems  Constitutional: Negative.  Negative for diaphoresis, fever, malaise/fatigue and weight loss.  HENT:  Negative for congestion.   Respiratory: Negative.  Negative for cough and shortness of breath.   Cardiovascular: Negative.  Negative for chest pain and leg swelling.  Gastrointestinal: Negative.  Negative for abdominal pain, constipation and diarrhea.  Genitourinary: Negative.  Negative for dysuria and frequency.  Musculoskeletal: Negative.  Negative for back pain, myalgias and neck pain.  Skin: Negative.  Negative for rash.  Neurological:  Positive for tingling and sensory change. Negative for  focal weakness and weakness.  Psychiatric/Behavioral: Negative.  Negative for depression and suicidal ideas. The patient is not nervous/anxious.     As per HPI. Otherwise, a complete review of systems is negative.  PAST MEDICAL HISTORY: Past Medical History:  Diagnosis Date   Anxiety    Bursitis    leg pain   Carpal tunnel syndrome    Cervical dysplasia    hx LEEP over 18 years ago.    Chronic lymphocytic leukemia (HCC) 2018   Dr Monique.  (in lymph nodes)   COPD (chronic obstructive pulmonary disease) (HCC)    COVID-19 2021   Depression    Eating disorder    GERD (gastroesophageal reflux disease)    History of self-harm    Insomnia    Obsession    Tobacco abuse    Vitamin B12 deficiency (non anemic)     PAST SURGICAL HISTORY: Past Surgical History:  Procedure Laterality Date   BREAST BIOPSY Right 01/05/2018   US  guided biopsy of 2 areas and 1 lymph node, MIXED INFLAMMATION AND GIANT CELL REACTION   CERVICAL BIOPSY  W/ LOOP ELECTRODE EXCISION     COLONOSCOPY WITH PROPOFOL  N/A 04/21/2019   Procedure: COLONOSCOPY WITH PROPOFOL ;  Surgeon: Janalyn Keene NOVAK, MD;  Location: [facility] ENDOSCOPY;  Service: Gastroenterology;  Laterality: N/A;   OTHER SURGICAL HISTORY     scar tissue removed from vocal cords   TUBAL LIGATION     VAGINAL HYSTERECTOMY N/A 01/22/2021   Procedure: HYSTERECTOMY VAGINAL; BILATERAL SALPINGECTOMY;  Surgeon: Connell Davies, MD;  Location: [facility] ORS;  Service: Gynecology;  Laterality: N/A;   vocal cord surgery  2005    FAMILY HISTORY: Family History  Problem Relation Age of Onset   Depression  Mother    Cancer Mother        thyroid   Alcohol abuse Father    COPD Father    Alcohol abuse Brother    Depression Brother    Bipolar disorder Brother    Suicidality Brother    ADD / ADHD Son    Breast cancer Neg Hx     ADVANCED DIRECTIVES (Y/N):  N  HEALTH MAINTENANCE: Social History   Tobacco Use   Smoking status: Every Day    Types: E-cigarettes    Smokeless tobacco: Never  Vaping Use   Vaping status: Former  Substance Use Topics   Alcohol use: Not Currently    Alcohol/week: 0.0 standard drinks of alcohol    Comment: rarely   Drug use: Yes    Frequency: 7.0 times per week    Types: Marijuana     Colonoscopy:  PAP:  Bone density:  Lipid panel:  Allergies  Allergen Reactions   Doxycycline  Rash    Rash on cheeks and sides of nose    Duloxetine  Rash    Current Outpatient Medications  Medication Sig Dispense Refill   albuterol  (VENTOLIN  HFA) 108 (90 Base) MCG/ACT inhaler Inhale 2 puffs into the lungs every 6 (six) hours as needed for wheezing or shortness of breath. 8 g 0   ARIPiprazole  (ABILIFY ) 5 MG tablet Take 1 tablet (5 mg total) by mouth daily. 90 tablet 1   ASPIRIN  81 PO Take 81 mg by mouth daily.     atorvastatin  (LIPITOR) 10 MG tablet Take 1 tablet (10 mg total) by mouth daily. 100 tablet 3   baclofen  (LIORESAL ) 10 MG tablet Take 1 tablet (10 mg total) by mouth 2 (two) times daily. Take 6 hours after morning tizanidine  dose 30 each 0   Ca Phosphate-Cholecalciferol (CALTRATE GUMMY BITES) 250-10 MG-MCG CHEW Chew 1 gummy by mouth 2 (two) times daily. 180 tablet 3   estrogens , conjugated, (PREMARIN ) 0.625 MG tablet Take 1 tablet (0.625 mg total) by mouth daily for 21 days, then do not take for 7 days. 90 tablet 0   famotidine  (PEPCID ) 10 MG tablet Take by mouth.     lenalidomide  (REVLIMID ) 10 MG capsule TAKE 1 CAPSULE BY MOUTH EVERY DAY 28 capsule 0   levocetirizine (XYZAL ) 5 MG tablet Take 1 tablet (5 mg total) by mouth every evening. 30 tablet 1   lidocaine  (LIDODERM ) 5 % Place 1 patch onto the skin daily. Remove & Discard patch within 12 hours or as directed by MD 30 patch 0   loratadine  (CLARITIN ) 10 MG tablet Take 1 tablet (10 mg total) by mouth daily in the morning. 90 tablet 1   nortriptyline  (PAMELOR ) 10 MG capsule Take 1 tablet (10 mg total) by mouth at night for one week then increase to 20 mg at night 60 capsule  3   nortriptyline  (PAMELOR ) 10 MG capsule Take 2 capsules (20 mg total) by mouth daily. 180 capsule 1   omeprazole  (PRILOSEC) 20 MG capsule Take 1 capsule (20 mg total) by mouth in the morning. 30 capsule 4   pregabalin  (LYRICA ) 150 MG capsule Take 1 capsule (150 mg total) by mouth 3 (three) times daily. 90 capsule 1   pregabalin  (LYRICA ) 150 MG capsule Take 1 capsule (150 mg total) by mouth 3 (three) times daily. 90 capsule 1   tiZANidine  (ZANAFLEX ) 2 MG tablet Take 1 tablet (2 mg total) by mouth at bedtime. 90 tablet 1   umeclidinium-vilanterol (ANORO ELLIPTA ) 62.5-25 MCG/ACT AEPB Inhale 1  puff into the lungs daily. 180 each 1   valACYclovir  (VALTREX ) 1000 MG tablet Take 1 tablet (1,000 mg total) by mouth 2 (two) times daily for 10 days. For 24 hours prn 20 tablet 0   vitamin B-12 (CYANOCOBALAMIN ) 500 MCG tablet Take 250 mcg by mouth every morning.     No current facility-administered medications for this visit.    OBJECTIVE: Vitals:   03/11/24 1055  BP: 101/68  Pulse: 85  Resp: 16  Temp: (!) 97.5 F (36.4 C)  SpO2: 100%       Body mass index is 27.22 kg/m.    ECOG FS:0 - Asymptomatic  General: Well-developed, well-nourished, no acute distress. Eyes: Pink conjunctiva, anicteric sclera. HEENT: Normocephalic, moist mucous membranes. Lungs: No audible wheezing or coughing. Heart: Regular rate and rhythm. Abdomen: Soft, nontender, no obvious distention. Musculoskeletal: No edema, cyanosis, or clubbing. Neuro: Alert, answering all questions appropriately. Cranial nerves grossly intact. Skin: No rashes or petechiae noted. Psych: Normal affect.  LAB RESULTS:  Lab Results  Component Value Date   NA 138 03/11/2024   K 4.2 03/11/2024   CL 103 03/11/2024   CO2 25 03/11/2024   GLUCOSE 120 (H) 03/11/2024   BUN 20 03/11/2024   CREATININE 1.37 (H) 03/11/2024   CALCIUM  9.6 03/11/2024   PROT 6.3 (L) 03/11/2024   ALBUMIN 3.9 03/11/2024   AST 23 03/11/2024   ALT 39 03/11/2024    ALKPHOS 99 03/11/2024   BILITOT 1.1 03/11/2024   GFRNONAA 46 (L) 03/11/2024   GFRAA 79 12/06/2020    Lab Results  Component Value Date   WBC 3.5 (L) 03/11/2024   NEUTROABS 0.5 (L) 03/11/2024   HGB 12.1 03/11/2024   HCT 34.9 (L) 03/11/2024   MCV 102.9 (H) 03/11/2024   PLT 45 (L) 03/11/2024     STUDIES: DG Bone Density Result Date: 03/10/2024 EXAM: DUAL X-RAY ABSORPTIOMETRY (DXA) FOR BONE MINERAL DENSITY 03/09/2024 3:21 pm CLINICAL DATA:  53 year old Female Postmenopausal. Osteoporosis screening TECHNIQUE: An axial (e.g., hips, spine) and/or appendicular (e.g., radius) exam was performed, as appropriate, using GE Secretary/administrator at [facility]. Images are obtained for bone mineral density measurement and are not obtained for diagnostic purposes. [id number] Exclusions: None. COMPARISON:  None. FINDINGS: Scan quality: Good. LUMBAR SPINE (L1-L4): BMD (in g/cm2): 1.104 T-score: -0.7 Z-score: 0.0 LEFT FEMORAL NECK: BMD (in g/cm2): 0.812 T-score: -1.6 Z-score: -0.6 LEFT TOTAL HIP: BMD (in g/cm2): 0.777 T-score: -1.8 Z-score: -1.2 RIGHT FEMORAL NECK: BMD (in g/cm2): 0.841 T-score: -1.4 Z-score: -0.4 RIGHT TOTAL HIP: BMD (in g/cm2): 0.942 T-score: -0.5 Z-score: 0.1 FRAX 10-YEAR PROBABILITY OF FRACTURE: 10-year fracture risk is performed using the [employer] FRAX calculator based on patient-reported risk factors. Major osteoporotic fracture: 22.9% Hip fracture: 1.2% Other situations known to alter the reliability of the FRAX score should be considered when making treatment decisions, including chronic glucocorticoid use and past treatments. Further guidance on treatment can be found at the [facility] Osteoporosis Foundation's website https://www.patton.com/. IMPRESSION: Osteopenia based on BMD. Fracture risk is increased. Increased risk is based on low BMD, FRAX calculation. RECOMMENDATIONS: 1. All patients should optimize calcium  and vitamin D  intake. 2. Consider FDA-approved  medical therapies in postmenopausal women and men aged 55 years and older, based on the following: - A hip or vertebral (clinical or morphometric) fracture - T-score less than or equal to -2.5 and secondary causes have been excluded. - Low bone mass (T-score between -1.0 and -2.5) and a 10-year probability of  a hip fracture greater than or equal to 3% or a 10-year probability of a major osteoporosis-related fracture greater than or equal to 20% based on the [place] -adapted WHO algorithm. - Clinician judgment and/or patient preferences may indicate treatment for people with 10-year fracture probabilities above or below these levels 3. Patients with diagnosis of osteoporosis or at high risk for fracture should have regular bone mineral density tests. For patients eligible for Medicare, routine testing is allowed once every 2 years. The testing frequency can be increased to one year for patients who have rapidly progressing disease, those who are receiving or discontinuing medical therapy to restore bone mass, or have additional risk factors. Electronically Signed   By: Reyes Phi M.D.   On: 03/10/2024 06:18     ONCOLOGY HISTORY:  Patient completed cycle 3 of Rituxan  plus Treanda  on March 26, 2018, but was then noted to have progressive disease.  She was then initiated on 480 mg Imbruvica  in November 2019, but had progression of disease with increasing white blood cell count as well as CT scan on Jan 22, 2022 revealing increased lymphadenopathy consistent with progression of disease.  Attempted patient on venetoclax , but she could not tolerate it secondary to persistent diarrhea and transaminitis.   ASSESSMENT: CLL.  PLAN:    CLL: Confirmed by peripheral blood flow cytometry.  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  Patient initially completed 4 weekly cycles of Rituxan  then was transition to treatment every 4 weeks.  She last received  Rituxan  on May 07, 2023.  She also recently completed XRT to her left axilla.  PET scan results from Feb 06, 2024 reviewed independently with mild progression of disease in multiple lymph nodes above and below the diaphragm.  SUV has remained stable in these lesions.  Despite mild progression, will continue with Revlimid  10 mg daily.  Continue Claritin  and famotidine  along with treatments.  No further XRT is planned at this time.  We discussed the possibility that if patient has continued progression, she may require referral to [facility] or [facility] for consideration of CAR-T therapy.  Pirtobrutinib is also an option.  Patient is going to [place]  for the month of July, therefore will return to clinic in 6 weeks for repeat laboratory work and further evaluation.  Will repeat imaging at the end of August 2025.  Appreciate clinical pharmacy input.  Neutropenia: Chronic and unchanged.  Patient's ANC is 0.5.  Proceed cautiously with Revlimid  as above. Thrombocytopenia: Chronic and unchanged.  Patient's platelet count is 45.  Monitor. Anemia: Resolved. Trigeminal neuralgia: Resolved.  MRI of the brain on Jan 25, 2019 was unremarkable.   Depression/anxiety: Chronic and unchanged.  Continue follow-up and treatment per primary care. Neuropathic pain/peripheral neuropathy: Chronic and unchanged.  She reports she is now on disability.  Continue current follow-up and treatment as per neurology. Rituxan  reaction: Patient will require additional premedications for preventative measures if Rituxan  is reinitiated in the future. Renal insufficiency: Mild, monitor. Hand cramping: IV iron  infusion is unlikely to be of benefit given her a normal hemoglobin and iron  stores.  Patient expressed understanding and was in agreement with this plan. She also understands that She can call clinic at any time with any questions, concerns, or complaints.    Monique JINNY Reusing, MD   03/11/2024 11:23 AM

## 2024-03-11 NOTE — Progress Notes (Signed)
 Per neurology wants to see if she can get iron  infusion. Also has cramps in hands and feet. Also had bone density was diagnosed with osteopenia.

## 2024-03-12 ENCOUNTER — Other Ambulatory Visit: Payer: Self-pay

## 2024-03-12 ENCOUNTER — Other Ambulatory Visit (HOSPITAL_COMMUNITY): Payer: Self-pay

## 2024-03-15 ENCOUNTER — Other Ambulatory Visit: Payer: Self-pay

## 2024-03-16 ENCOUNTER — Encounter: Payer: Self-pay | Admitting: *Deleted

## 2024-03-18 ENCOUNTER — Encounter: Payer: Self-pay | Admitting: Oncology

## 2024-03-18 ENCOUNTER — Other Ambulatory Visit: Payer: Self-pay

## 2024-03-18 ENCOUNTER — Other Ambulatory Visit (HOSPITAL_COMMUNITY): Payer: Self-pay

## 2024-03-18 MED FILL — Omeprazole Cap Delayed Release 20 MG: ORAL | 30 days supply | Qty: 30 | Fill #0 | Status: AC

## 2024-03-20 ENCOUNTER — Other Ambulatory Visit (HOSPITAL_COMMUNITY): Payer: Self-pay

## 2024-03-20 ENCOUNTER — Encounter: Payer: Self-pay | Admitting: Oncology

## 2024-03-25 ENCOUNTER — Other Ambulatory Visit: Payer: Self-pay

## 2024-03-25 ENCOUNTER — Telehealth: Payer: Self-pay | Admitting: *Deleted

## 2024-03-25 NOTE — Telephone Encounter (Signed)
 The patient is saying he she is bruising from thigh and knee's and she is in [place]  with her mother and will not be here in [place]  until August 15 and she wondered if she should stop taking the Revlimid  because of the bleeding.

## 2024-03-26 ENCOUNTER — Encounter: Payer: Self-pay | Admitting: *Deleted

## 2024-03-26 DIAGNOSIS — J90 Pleural effusion, not elsewhere classified: Secondary | ICD-10-CM | POA: Diagnosis not present

## 2024-03-26 DIAGNOSIS — R21 Rash and other nonspecific skin eruption: Secondary | ICD-10-CM | POA: Diagnosis not present

## 2024-03-26 DIAGNOSIS — M79661 Pain in right lower leg: Secondary | ICD-10-CM | POA: Diagnosis not present

## 2024-03-26 DIAGNOSIS — Z659 Problem related to unspecified psychosocial circumstances: Secondary | ICD-10-CM | POA: Diagnosis not present

## 2024-03-26 DIAGNOSIS — S2241XA Multiple fractures of ribs, right side, initial encounter for closed fracture: Secondary | ICD-10-CM | POA: Diagnosis not present

## 2024-03-26 DIAGNOSIS — M79604 Pain in right leg: Secondary | ICD-10-CM | POA: Diagnosis not present

## 2024-03-26 DIAGNOSIS — M79662 Pain in left lower leg: Secondary | ICD-10-CM | POA: Diagnosis not present

## 2024-03-26 DIAGNOSIS — J9811 Atelectasis: Secondary | ICD-10-CM | POA: Diagnosis not present

## 2024-03-26 DIAGNOSIS — R079 Chest pain, unspecified: Secondary | ICD-10-CM | POA: Diagnosis not present

## 2024-03-31 ENCOUNTER — Ambulatory Visit: Payer: Self-pay | Admitting: Family Medicine

## 2024-04-05 ENCOUNTER — Other Ambulatory Visit (HOSPITAL_COMMUNITY): Payer: Self-pay

## 2024-04-05 ENCOUNTER — Other Ambulatory Visit: Payer: Self-pay

## 2024-04-05 ENCOUNTER — Other Ambulatory Visit: Payer: Self-pay | Admitting: Family Medicine

## 2024-04-05 ENCOUNTER — Encounter: Payer: Self-pay | Admitting: Oncology

## 2024-04-06 ENCOUNTER — Encounter: Payer: Self-pay | Admitting: Oncology

## 2024-04-06 ENCOUNTER — Other Ambulatory Visit: Payer: Self-pay

## 2024-04-08 ENCOUNTER — Other Ambulatory Visit: Payer: Self-pay

## 2024-04-08 ENCOUNTER — Ambulatory Visit: Admitting: Pharmacist

## 2024-04-08 ENCOUNTER — Other Ambulatory Visit

## 2024-04-12 ENCOUNTER — Other Ambulatory Visit: Payer: Self-pay | Admitting: Oncology

## 2024-04-12 DIAGNOSIS — C9110 Chronic lymphocytic leukemia of B-cell type not having achieved remission: Secondary | ICD-10-CM

## 2024-04-14 ENCOUNTER — Encounter: Payer: Self-pay | Admitting: Oncology

## 2024-04-20 ENCOUNTER — Other Ambulatory Visit (HOSPITAL_COMMUNITY): Payer: Self-pay

## 2024-04-20 ENCOUNTER — Encounter: Payer: Self-pay | Admitting: Oncology

## 2024-04-20 ENCOUNTER — Other Ambulatory Visit: Payer: Self-pay

## 2024-04-20 ENCOUNTER — Other Ambulatory Visit: Payer: Self-pay | Admitting: Family Medicine

## 2024-04-20 ENCOUNTER — Telehealth: Payer: Self-pay | Admitting: Family Medicine

## 2024-04-20 MED ORDER — LORATADINE 10 MG PO TABS
10.0000 mg | ORAL_TABLET | Freq: Every day | ORAL | 0 refills | Status: DC
  Filled 2024-04-20: qty 30, 30d supply, fill #0

## 2024-04-20 MED FILL — Omeprazole Cap Delayed Release 20 MG: ORAL | 30 days supply | Qty: 30 | Fill #1 | Status: CN

## 2024-04-20 MED FILL — Omeprazole Cap Delayed Release 20 MG: ORAL | 30 days supply | Qty: 30 | Fill #1 | Status: AC

## 2024-04-20 NOTE — Telephone Encounter (Signed)
 atorvastatin  (LIPITOR) 10 MG tablet   100 day refill

## 2024-04-20 NOTE — Telephone Encounter (Signed)
 Pt has enough medication, was given a year supply.

## 2024-04-21 ENCOUNTER — Other Ambulatory Visit: Payer: Self-pay

## 2024-04-21 ENCOUNTER — Other Ambulatory Visit: Payer: Self-pay | Admitting: *Deleted

## 2024-04-21 DIAGNOSIS — C9110 Chronic lymphocytic leukemia of B-cell type not having achieved remission: Secondary | ICD-10-CM

## 2024-04-21 NOTE — Progress Notes (Unsigned)
 PCP: Sowles, Krichna, MD   No chief complaint on file.   HPI:      [name] is a 53 y.o. [id number] whose LMP was Patient's last menstrual period was 08/17/2016., presents today for her annual examination.  Her menses are absent due to hyst, no PMB. On ERT She {does:18564} have vasomotor sx.   Sex activity: {sex active: 315163}. She {does:18564} have vaginal dryness.  Last Pap: 10/26/20 Results were: low-grade squamous intraepithelial neoplasia (LGSIL - encompassing HPV,mild dysplasia,CIN I) /POS HPV DNA, CIN 1 on colpo bx 2/22; s/p vag hyst 5/22  Hx of STDs: {STD hx:14358}  Last mammogram: 12/17/23 with PCP; Results were: normal--routine follow-up in 12 months There is no FH of breast cancer. There is no FH of ovarian cancer. The patient {does:18564} do self-breast exams.  Colonoscopy: 8/20, Repeat due after 10 years.   Tobacco use: {tob:20664} Alcohol use: {Alcohol:11675} No drug use Exercise: {exercise:31265}  She {does:18564} get adequate calcium  and Vitamin D  in her diet.  Labs with PCP. Being treated for CLL at [facility] onc.    Patient Active Problem List   Diagnosis Date Noted   Stage 3a chronic kidney disease (HCC) 10/02/2023   Senile purpura (HCC) 10/02/2023   Major depression in remission (HCC) 10/02/2023   Pancytopenia (HCC) 10/02/2023   Post-menopause on HRT (hormone replacement therapy) 11/08/2022   At risk for sleep apnea 09/19/2021   Lumbar foraminal stenosis (L4-5) (Bilateral) (R>L) 06/05/2021   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 06/05/2021   Lumbosacral radiculopathy/radiculitis at L5 (Bilateral) 06/05/2021   PAD (peripheral artery disease) (HCC) 02/14/2021   CIN II (cervical intraepithelial neoplasia II) 02/01/2021   S/P vaginal hysterectomy 01/22/2021   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/09/2020   Bilateral leg pain 11/03/2019   Iron  deficiency anemia 10/15/2019   Bilateral arm pain 08/05/2019   Bilateral  hand numbness 08/05/2019   Weakness of both hands 08/05/2019   Chronic musculoskeletal pain 07/19/2019   Chronic neuropathic pain 07/19/2019   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   Special screening for malignant neoplasms, colon    Polyp of sigmoid colon    Hemorrhoids    Trigeminal neuralgia 10/05/2018   Lumbar radiculitis (Right) 09/24/2018   Hypocalcemia 06/03/2018   Vitamin D  insufficiency 06/03/2018   Abnormal MRI, lumbar spine (06/02/2020) 06/03/2018   Chronic hip pain (Right) 06/03/2018   Spondylosis without myelopathy or radiculopathy, lumbar region 06/03/2018   DDD (degenerative disc disease), lumbar 06/02/2018   Lumbar facet hypertrophy 06/02/2018   Lumbar facet arthropathy 06/02/2018   Lumbar facet syndrome (Bilateral) (R>L) 06/02/2018   Marijuana use 05/19/2018   Chronic lower extremity pain (1ry area of Pain) (Bilateral) (R>L) 05/13/2018   Chronic low back pain (2ry area of Pain) (Bilateral) (R>L) w/ sciatica (Bilateral) 05/13/2018   Chronic pain syndrome 05/13/2018   Disorder of skeletal system 05/13/2018   Pharmacologic therapy 05/13/2018   Problems influencing health status 05/13/2018   Mastalgia 05/05/2018   Breast mass, right 01/07/2018   Face pain 12/11/2017   Tingling 12/11/2017   Thoracic aortic atherosclerosis (HCC) 08/20/2017   Emphysema of lung (HCC) 08/20/2017   Adductor tendinitis 04/23/2017   Trochanteric bursitis of right hip 04/23/2017   GERD without esophagitis 12/11/2016   CLL (chronic lymphocytic leukemia) (HCC) 11/24/2016   Stress incontinence 09/04/2016   B12 deficiency 07/10/2015   Insomnia 07/10/2015   Anorexia nervosa, restricting type 07/10/2015   Anxiety, generalized 07/10/2015   H/O suicide attempt 07/10/2015   Lymphocytosis 07/10/2015  Obsessive-compulsive disorder 07/10/2015   Tobacco use 07/10/2015   History of cervical dysplasia 07/18/2014    Past Surgical History:  Procedure Laterality Date   BREAST BIOPSY Right  01/05/2018   US  guided biopsy of 2 areas and 1 lymph node, MIXED INFLAMMATION AND GIANT CELL REACTION   CERVICAL BIOPSY  W/ LOOP ELECTRODE EXCISION     COLONOSCOPY WITH PROPOFOL  N/A 04/21/2019   Procedure: COLONOSCOPY WITH PROPOFOL ;  Surgeon: Janalyn Keene NOVAK, MD;  Location: [facility] ENDOSCOPY;  Service: Gastroenterology;  Laterality: N/A;   OTHER SURGICAL HISTORY     scar tissue removed from vocal cords   TUBAL LIGATION     VAGINAL HYSTERECTOMY N/A 01/22/2021   Procedure: HYSTERECTOMY VAGINAL; BILATERAL SALPINGECTOMY;  Surgeon: Connell Davies, MD;  Location: [facility] ORS;  Service: Gynecology;  Laterality: N/A;   vocal cord surgery  2005    Family History  Problem Relation Age of Onset   Depression Mother    Cancer Mother        thyroid   Alcohol abuse Father    COPD Father    Alcohol abuse Brother    Depression Brother    Bipolar disorder Brother    Suicidality Brother    ADD / ADHD Son    Breast cancer Neg Hx     Social History   Socioeconomic History   Marital status: Divorced    Spouse name: NA   Number of children: 4   Years of education: 12   Highest education level: 12th grade  Occupational History   Occupation: Disabled   Tobacco Use   Smoking status: Every Day    Types: E-cigarettes   Smokeless tobacco: Never  Vaping Use   Vaping status: Former  Substance and Sexual Activity   Alcohol use: Not Currently    Alcohol/week: 0.0 standard drinks of alcohol    Comment: rarely   Drug use: Yes    Frequency: 7.0 times per week    Types: Marijuana   Sexual activity: Yes    Partners: Male    Birth control/protection: Surgical  Other Topics Concern   Not on file  Social History Narrative   Patient is single Patient is currently on social security disability due to health challenges.     Her teenager son is living with his father    Her father lives in [place] and is on end stage COPD   Social Drivers of Health   Financial Resource Strain: Low Risk  (02/05/2024)    Overall Financial Resource Strain (CARDIA)    Difficulty of Paying Living Expenses: Not very hard  Food Insecurity: No Food Insecurity (02/05/2024)   Received from [facility]   Hunger Vital Sign    Within the past 12 months, you worried that your food would run out before you got the money to buy more.: Never true    Within the past 12 months, the food you bought just didn't last and you didn't have money to get more.: Never true  Transportation Needs: No Transportation Needs (02/05/2024)   PRAPARE - Administrator, Civil Service (Medical): No    Lack of Transportation (Non-Medical): No  Physical Activity: Inactive (02/05/2024)   Exercise Vital Sign    Days of Exercise per Week: 0 days    Minutes of Exercise per Session: 0 min  Stress: No Stress Concern Present (02/05/2024)   [employer] of Occupational Health - Occupational Stress Questionnaire    Feeling of Stress : Not at all  Social Connections: Socially Isolated (02/05/2024)   Social Connection and Isolation Panel    Frequency of Communication with Friends and Family: More than three times a week    Frequency of Social Gatherings with Friends and Family: Three times a week    Attends Religious Services: Never    Active Member of Clubs or Organizations: No    Attends Banker Meetings: Never    Marital Status: Divorced  Catering manager Violence: Not At Risk (12/18/2023)   Humiliation, Afraid, Rape, and Kick questionnaire    Fear of Current or Ex-Partner: No    Emotionally Abused: No    Physically Abused: No    Sexually Abused: No     Current Outpatient Medications:    albuterol  (VENTOLIN  HFA) 108 (90 Base) MCG/ACT inhaler, Inhale 2 puffs into the lungs every 6 (six) hours as needed for wheezing or shortness of breath., Disp: 8 g, Rfl: 0   ARIPiprazole  (ABILIFY ) 5 MG tablet, Take 1 tablet (5 mg total) by mouth daily., Disp: 90 tablet, Rfl: 0   ASPIRIN  81 PO, Take 81 mg by mouth  daily., Disp: , Rfl:    atorvastatin  (LIPITOR) 10 MG tablet, Take 1 tablet (10 mg total) by mouth daily., Disp: 100 tablet, Rfl: 3   baclofen  (LIORESAL ) 10 MG tablet, Take 1 tablet (10 mg total) by mouth 2 (two) times daily. Take 6 hours after morning tizanidine  dose, Disp: 30 each, Rfl: 0   Ca Phosphate-Cholecalciferol (CALTRATE GUMMY BITES) 250-10 MG-MCG CHEW, Chew 1 gummy by mouth 2 (two) times daily., Disp: 180 tablet, Rfl: 3   estrogens , conjugated, (PREMARIN ) 0.625 MG tablet, Take 1 tablet (0.625 mg total) by mouth daily for 21 days, then do not take for 7 days., Disp: 90 tablet, Rfl: 0   famotidine  (PEPCID ) 10 MG tablet, Take by mouth., Disp: , Rfl:    lenalidomide  (REVLIMID ) 10 MG capsule, TAKE 1 CAPSULE BY MOUTH EVERY DAY, Disp: 28 capsule, Rfl: 0   levocetirizine (XYZAL ) 5 MG tablet, Take 1 tablet (5 mg total) by mouth every evening., Disp: 30 tablet, Rfl: 1   lidocaine  (LIDODERM ) 5 %, Place 1 patch onto the skin daily. Remove & Discard patch within 12 hours or as directed by MD, Disp: 30 patch, Rfl: 0   loratadine  (CLARITIN ) 10 MG tablet, Take 1 tablet (10 mg total) by mouth daily in the morning., Disp: 30 tablet, Rfl: 0   nortriptyline  (PAMELOR ) 10 MG capsule, Take 1 tablet (10 mg total) by mouth at night for one week then increase to 20 mg at night, Disp: 60 capsule, Rfl: 3   nortriptyline  (PAMELOR ) 10 MG capsule, Take 2 capsules (20 mg total) by mouth daily., Disp: 180 capsule, Rfl: 1   omeprazole  (PRILOSEC) 20 MG capsule, Take 1 capsule (20 mg total) by mouth in the morning., Disp: 30 capsule, Rfl: 1   pregabalin  (LYRICA ) 150 MG capsule, Take 1 capsule (150 mg total) by mouth 3 (three) times daily., Disp: 90 capsule, Rfl: 1   pregabalin  (LYRICA ) 150 MG capsule, Take 1 capsule (150 mg total) by mouth 3 (three) times daily., Disp: 90 capsule, Rfl: 1   tiZANidine  (ZANAFLEX ) 2 MG tablet, Take 1 tablet (2 mg total) by mouth at bedtime., Disp: 90 tablet, Rfl: 0   umeclidinium-vilanterol  (ANORO ELLIPTA ) 62.5-25 MCG/ACT AEPB, Inhale 1 puff into the lungs daily., Disp: 180 each, Rfl: 1   vitamin B-12 (CYANOCOBALAMIN ) 500 MCG tablet, Take 250 mcg by mouth every morning., Disp: , Rfl:  ROS:  Review of Systems BREAST: No symptoms    Objective: LMP 08/17/2016    OBGyn Exam  Results: No results found. However, due to the size of the patient record, not all encounters were searched. Please check Results Review for a complete set of results.  Assessment/Plan:  No diagnosis found.   No orders of the defined types were placed in this encounter.           GYN counsel {counseling: 16159}    F/U  No follow-ups on file.  Olivia Pavelko B. Wilhelmenia Addis, PA-C 04/21/2024 9:30 AM

## 2024-04-22 ENCOUNTER — Inpatient Hospital Stay: Admitting: Pharmacist

## 2024-04-22 ENCOUNTER — Other Ambulatory Visit (HOSPITAL_COMMUNITY): Payer: Self-pay

## 2024-04-22 ENCOUNTER — Encounter: Payer: Self-pay | Admitting: Oncology

## 2024-04-22 ENCOUNTER — Other Ambulatory Visit: Payer: Self-pay | Admitting: *Deleted

## 2024-04-22 ENCOUNTER — Other Ambulatory Visit: Payer: Self-pay

## 2024-04-22 ENCOUNTER — Inpatient Hospital Stay: Admitting: Oncology

## 2024-04-22 ENCOUNTER — Other Ambulatory Visit (HOSPITAL_COMMUNITY)
Admission: RE | Admit: 2024-04-22 | Discharge: 2024-04-22 | Disposition: A | Discharge status: Home / Self Care | Source: Ambulatory Visit | Attending: Obstetrics and Gynecology | Admitting: Obstetrics and Gynecology

## 2024-04-22 ENCOUNTER — Inpatient Hospital Stay

## 2024-04-22 ENCOUNTER — Inpatient Hospital Stay: Attending: Oncology

## 2024-04-22 ENCOUNTER — Encounter: Payer: Self-pay | Admitting: Obstetrics and Gynecology

## 2024-04-22 ENCOUNTER — Ambulatory Visit: Admitting: Obstetrics and Gynecology

## 2024-04-22 VITALS — BP 105/55 | HR 97 | Temp 97.2°F | Resp 16 | Wt 175.0 lb

## 2024-04-22 VITALS — BP 94/62 | HR 102 | Ht 67.0 in | Wt 176.0 lb

## 2024-04-22 DIAGNOSIS — C9110 Chronic lymphocytic leukemia of B-cell type not having achieved remission: Secondary | ICD-10-CM

## 2024-04-22 DIAGNOSIS — Z01411 Encounter for gynecological examination (general) (routine) with abnormal findings: Secondary | ICD-10-CM | POA: Diagnosis not present

## 2024-04-22 DIAGNOSIS — Z01419 Encounter for gynecological examination (general) (routine) without abnormal findings: Secondary | ICD-10-CM

## 2024-04-22 DIAGNOSIS — Z79899 Other long term (current) drug therapy: Secondary | ICD-10-CM | POA: Insufficient documentation

## 2024-04-22 DIAGNOSIS — D649 Anemia, unspecified: Secondary | ICD-10-CM | POA: Diagnosis not present

## 2024-04-22 DIAGNOSIS — N951 Menopausal and female climacteric states: Secondary | ICD-10-CM | POA: Diagnosis not present

## 2024-04-22 DIAGNOSIS — Z79890 Hormone replacement therapy (postmenopausal): Secondary | ICD-10-CM | POA: Diagnosis not present

## 2024-04-22 DIAGNOSIS — Z1151 Encounter for screening for human papillomavirus (HPV): Secondary | ICD-10-CM | POA: Diagnosis not present

## 2024-04-22 DIAGNOSIS — N870 Mild cervical dysplasia: Secondary | ICD-10-CM

## 2024-04-22 DIAGNOSIS — F17210 Nicotine dependence, cigarettes, uncomplicated: Secondary | ICD-10-CM | POA: Diagnosis not present

## 2024-04-22 DIAGNOSIS — Z1272 Encounter for screening for malignant neoplasm of vagina: Secondary | ICD-10-CM | POA: Diagnosis not present

## 2024-04-22 DIAGNOSIS — Z1231 Encounter for screening mammogram for malignant neoplasm of breast: Secondary | ICD-10-CM

## 2024-04-22 DIAGNOSIS — D696 Thrombocytopenia, unspecified: Secondary | ICD-10-CM | POA: Diagnosis not present

## 2024-04-22 LAB — CBC WITH DIFFERENTIAL (CANCER CENTER ONLY)
Abs Immature Granulocytes: 0.17 K/uL — ABNORMAL HIGH (ref 0.00–0.07)
Basophils Absolute: 0 K/uL (ref 0.0–0.1)
Basophils Relative: 0 %
Eosinophils Absolute: 0 K/uL (ref 0.0–0.5)
Eosinophils Relative: 1 %
HCT: 23.5 % — ABNORMAL LOW (ref 36.0–46.0)
Hemoglobin: 7.9 g/dL — ABNORMAL LOW (ref 12.0–15.0)
Immature Granulocytes: 5 %
Lymphocytes Relative: 59 %
Lymphs Abs: 1.9 K/uL (ref 0.7–4.0)
MCH: 36.4 pg — ABNORMAL HIGH (ref 26.0–34.0)
MCHC: 33.6 g/dL (ref 30.0–36.0)
MCV: 108.3 fL — ABNORMAL HIGH (ref 80.0–100.0)
Monocytes Absolute: 0.5 K/uL (ref 0.1–1.0)
Monocytes Relative: 14 %
Neutro Abs: 0.7 K/uL — ABNORMAL LOW (ref 1.7–7.7)
Neutrophils Relative %: 21 %
Platelet Count: 44 K/uL — ABNORMAL LOW (ref 150–400)
RBC: 2.17 MIL/uL — ABNORMAL LOW (ref 3.87–5.11)
RDW: 18.2 % — ABNORMAL HIGH (ref 11.5–15.5)
WBC Count: 3.2 K/uL — ABNORMAL LOW (ref 4.0–10.5)
nRBC: 0 % (ref 0.0–0.2)

## 2024-04-22 LAB — CMP (CANCER CENTER ONLY)
ALT: 12 U/L (ref 0–44)
AST: 20 U/L (ref 15–41)
Albumin: 3.3 g/dL — ABNORMAL LOW (ref 3.5–5.0)
Alkaline Phosphatase: 115 U/L (ref 38–126)
Anion gap: 8 (ref 5–15)
BUN: 19 mg/dL (ref 6–20)
CO2: 23 mmol/L (ref 22–32)
Calcium: 8.6 mg/dL — ABNORMAL LOW (ref 8.9–10.3)
Chloride: 104 mmol/L (ref 98–111)
Creatinine: 1.37 mg/dL — ABNORMAL HIGH (ref 0.44–1.00)
GFR, Estimated: 46 mL/min — ABNORMAL LOW (ref >60)
Glucose, Bld: 97 mg/dL (ref 70–99)
Potassium: 4 mmol/L (ref 3.5–5.1)
Sodium: 135 mmol/L (ref 135–145)
Total Bilirubin: 0.6 mg/dL (ref 0.0–1.2)
Total Protein: 5.6 g/dL — ABNORMAL LOW (ref 6.5–8.1)

## 2024-04-22 LAB — FOLATE: Folate: 8.5 ng/mL (ref >5.9)

## 2024-04-22 LAB — PREPARE RBC (CROSSMATCH)

## 2024-04-22 LAB — RETICULOCYTES
Immature Retic Fract: 6.4 % (ref 2.3–15.9)
RBC.: 2.19 MIL/uL — ABNORMAL LOW (ref 3.87–5.11)
Retic Count, Absolute: 45.8 K/uL (ref 19.0–186.0)
Retic Ct Pct: 2.1 % (ref 0.4–3.1)

## 2024-04-22 LAB — FERRITIN: Ferritin: 89 ng/mL (ref 11–307)

## 2024-04-22 LAB — IRON AND TIBC
Iron: 58 ug/dL (ref 28–170)
Saturation Ratios: 18 % (ref 10.4–31.8)
TIBC: 323 ug/dL (ref 250–450)
UIBC: 265 ug/dL

## 2024-04-22 LAB — VITAMIN B12: Vitamin B-12: 989 pg/mL — ABNORMAL HIGH (ref 180–914)

## 2024-04-22 LAB — LACTATE DEHYDROGENASE: LDH: 276 U/L — ABNORMAL HIGH (ref 98–192)

## 2024-04-22 MED ORDER — ESTRADIOL 0.5 MG PO TABS
0.5000 mg | ORAL_TABLET | Freq: Every day | ORAL | 3 refills | Status: AC
  Filled 2024-04-22: qty 30, 30d supply, fill #0
  Filled 2024-05-18 – 2024-05-19 (×2): qty 30, 30d supply, fill #1
  Filled 2024-06-17 – 2024-06-23 (×2): qty 30, 30d supply, fill #2
  Filled 2024-07-19: qty 30, 30d supply, fill #3
  Filled 2024-08-10 – 2024-08-16 (×3): qty 30, 30d supply, fill #4
  Filled 2024-09-15: qty 30, 30d supply, fill #5
  Filled 2024-10-11: qty 30, 30d supply, fill #6

## 2024-04-22 NOTE — Progress Notes (Unsigned)
 [name]  [phone] [phone]  ID: [name] [name] OB: [birth year]  MR#: [id number]  RDW#:[id number]  Patient Care Team: Monique [name], [name] [name], [name] [name] as PCP - General (Family Medicine) [name] [name] Monique [name], [name] [name] as Consulting Physician (Oncology) [name] [name], [name] [name] (Inactive) as Pharmacist (Pharmacist) Monique Powell PARAS, Monique [name] as Registered Nurse (Oncology) Monique [name], Monique SAUNDERS, NP as Nurse Practitioner (Hospice and Palliative Medicine) [name] [name], [name] [name] as Consulting Physician (Radiation Oncology) [name] [name] Od  CHIEF COMPLAINT: CLL.  INTERVAL HISTORY: Patient returns to clinic today for repeat laboratory, further evaluation, and consideration of reinitiating Revlimid .  While patient was in [place]  recently, she had significant increased bruising and was noted to have a reduced platelet count at local urgent care.  She was instructed to hold Revlimid  until she returns to [place] .  She continues to have bruising, but this is mildly improved.  She otherwise feels well.  She denies any recent fevers or illnesses.  She continues to have a chronic peripheral neuropathy and hand cramping. She has no other neurologic complaints.  She has a good appetite and her weight has remained stable.  She has no chest pain, shortness of breath, cough, or hemoptysis.  She denies any nausea, vomiting, constipation, or diarrhea.  She has no melena or hematochezia.  She has no urinary complaints.  Patient offers no further specific complaints today.  REVIEW OF SYSTEMS:   Review of Systems  Constitutional: Negative.  Negative for diaphoresis, fever, malaise/fatigue and weight loss.  HENT:  Negative for congestion.   Respiratory: Negative.  Negative for cough and shortness of breath.   Cardiovascular: Negative.  Negative for chest pain and leg swelling.  Gastrointestinal: Negative.  Negative for abdominal pain, constipation and diarrhea.  Genitourinary:  Negative.  Negative for dysuria and frequency.  Musculoskeletal: Negative.  Negative for back pain, myalgias and neck pain.  Skin: Negative.  Negative for rash.  Neurological:  Positive for tingling and sensory change. Negative for focal weakness and weakness.  Endo/Heme/Allergies:  Bruises/bleeds easily.  Psychiatric/Behavioral: Negative.  Negative for depression and suicidal ideas. The patient is not nervous/anxious.     As per HPI. Otherwise, a complete review of systems is negative.  PAST MEDICAL HISTORY: Past Medical History:  Diagnosis Date   Anxiety    Bursitis    leg pain   Carpal tunnel syndrome    Cervical dysplasia    hx LEEP over 18 years ago.    Chronic lymphocytic leukemia (HCC) 2018   Dr Monique [name].  (in lymph nodes)   COPD (chronic obstructive pulmonary disease) (HCC)    COVID-19 2021   Depression    Eating disorder    GERD (gastroesophageal reflux disease)    History of self-harm    Insomnia    Obsession    Tobacco abuse    Vitamin B12 deficiency (non anemic)     PAST SURGICAL HISTORY: Past Surgical History:  Procedure Laterality Date   BREAST BIOPSY Right 01/05/2018   US  guided biopsy of 2 areas and 1 lymph node, MIXED INFLAMMATION AND GIANT CELL REACTION   CERVICAL BIOPSY  W/ LOOP ELECTRODE EXCISION     COLONOSCOPY WITH PROPOFOL  N/A 04/21/2019   Procedure: COLONOSCOPY WITH PROPOFOL ;  Surgeon: [name] [name], [name] [name];  Location: [facility] ENDOSCOPY;  Service: Gastroenterology;  Laterality: N/A;   OTHER SURGICAL HISTORY     scar tissue removed from vocal cords   TUBAL LIGATION     VAGINAL HYSTERECTOMY N/A 01/22/2021   Procedure:  HYSTERECTOMY VAGINAL; BILATERAL SALPINGECTOMY;  Surgeon: [name] [name], [name] [name];  Location: [facility] ORS;  Service: Gynecology;  Laterality: N/A;   vocal cord surgery  2005    FAMILY HISTORY: Family History  Problem Relation Age of Onset   Depression Mother    Cancer Mother        thyroid   Alcohol abuse Father    COPD Father    Alcohol  abuse Brother    Depression Brother    Bipolar disorder Brother    Suicidality Brother    ADD / ADHD Son    Breast cancer Neg Hx     ADVANCED DIRECTIVES (Y/N):  N  HEALTH MAINTENANCE: Social History   Tobacco Use   Smoking status: Every Day    Types: E-cigarettes   Smokeless tobacco: Never  Vaping Use   Vaping status: Former  Substance Use Topics   Alcohol use: Not Currently    Alcohol/week: 0.0 standard drinks of alcohol    Comment: rarely   Drug use: Yes    Frequency: 7.0 times per week    Types: Marijuana     Colonoscopy:  PAP:  Bone density:  Lipid panel:  Allergies  Allergen Reactions   Doxycycline  Rash    Rash on cheeks and sides of nose    Duloxetine  Rash    Current Outpatient Medications  Medication Sig Dispense Refill   albuterol  (VENTOLIN  HFA) 108 (90 Base) MCG/ACT inhaler Inhale 2 puffs into the lungs every 6 (six) hours as needed for wheezing or shortness of breath. 8 g 0   ARIPiprazole  (ABILIFY ) 5 MG tablet Take 1 tablet (5 mg total) by mouth daily. 90 tablet 0   ASPIRIN  81 PO Take 81 mg by mouth daily.     atorvastatin  (LIPITOR) 10 MG tablet Take 1 tablet (10 mg total) by mouth daily. 100 tablet 3   baclofen  (LIORESAL ) 10 MG tablet Take 1 tablet (10 mg total) by mouth 2 (two) times daily. Take 6 hours after morning tizanidine  dose 30 each 0   Ca Phosphate-Cholecalciferol (CALTRATE GUMMY BITES) 250-10 MG-MCG CHEW Chew 1 gummy by mouth 2 (two) times daily. 180 tablet 3   estradiol  (ESTRACE ) 0.5 MG tablet Take 1 tablet (0.5 mg total) by mouth daily. 90 tablet 3   famotidine  (PEPCID ) 10 MG tablet Take by mouth.     levocetirizine (XYZAL ) 5 MG tablet Take 1 tablet (5 mg total) by mouth every evening. 30 tablet 1   lidocaine  (LIDODERM ) 5 % Place 1 patch onto the skin daily. Remove & Discard patch within 12 hours or as directed by [name] [name] 30 patch 0   loratadine  (CLARITIN ) 10 MG tablet Take 1 tablet (10 mg total) by mouth daily in the morning. 30 tablet 0    MAGNESIUM  PO Take by mouth.     nortriptyline  (PAMELOR ) 10 MG capsule Take 1 tablet (10 mg total) by mouth at night for one week then increase to 20 mg at night 60 capsule 3   nortriptyline  (PAMELOR ) 10 MG capsule Take 2 capsules (20 mg total) by mouth daily. 180 capsule 1   omeprazole  (PRILOSEC) 20 MG capsule Take 1 capsule (20 mg total) by mouth in the morning. 30 capsule 1   pregabalin  (LYRICA ) 150 MG capsule Take 1 capsule (150 mg total) by mouth 3 (three) times daily. 90 capsule 1   pregabalin  (LYRICA ) 150 MG capsule Take 1 capsule (150 mg total) by mouth 3 (three) times daily. 90 capsule 1   tiZANidine  (ZANAFLEX ) 2 MG tablet  Take 1 tablet (2 mg total) by mouth at bedtime. 90 tablet 0   umeclidinium-vilanterol (ANORO ELLIPTA ) 62.5-25 MCG/ACT AEPB Inhale 1 puff into the lungs daily. 180 each 1   vitamin B-12 (CYANOCOBALAMIN ) 500 MCG tablet Take 250 mcg by mouth every morning.     VITAMIN D  PO Take by mouth.     lenalidomide  (REVLIMID ) 10 MG capsule TAKE 1 CAPSULE BY MOUTH EVERY DAY (Patient not taking: Reported on 04/22/2024) 28 capsule 0   No current facility-administered medications for this visit.    OBJECTIVE: Vitals:   04/22/24 1421  BP: (!) 105/55  Pulse: 97  Resp: 16  Temp: (!) 97.2 F (36.2 C)       Body mass index is 27.41 kg/m.    ECOG FS:0 - Asymptomatic  General: Well-developed, well-nourished, no acute distress. Eyes: Pink conjunctiva, anicteric sclera. HEENT: Normocephalic, moist mucous membranes. Lungs: No audible wheezing or coughing. Heart: Regular rate and rhythm. Abdomen: Soft, nontender, no obvious distention. Musculoskeletal: No edema, cyanosis, or clubbing. Neuro: Alert, answering all questions appropriately. Cranial nerves grossly intact. Skin: No rashes or petechiae noted. Psych: Normal affect.  LAB RESULTS:  Lab Results  Component Value Date   NA 135 04/22/2024   K 4.0 04/22/2024   CL 104 04/22/2024   CO2 23 04/22/2024   GLUCOSE 97 04/22/2024    BUN 19 04/22/2024   CREATININE 1.37 (H) 04/22/2024   CALCIUM  8.6 (L) 04/22/2024   PROT 5.6 (L) 04/22/2024   ALBUMIN 3.3 (L) 04/22/2024   AST 20 04/22/2024   ALT 12 04/22/2024   ALKPHOS 115 04/22/2024   BILITOT 0.6 04/22/2024   GFRNONAA 46 (L) 04/22/2024   GFRAA 79 12/06/2020    Lab Results  Component Value Date   WBC 3.2 (L) 04/22/2024   NEUTROABS 0.7 (L) 04/22/2024   HGB 7.9 (L) 04/22/2024   HCT 23.5 (L) 04/22/2024   MCV 108.3 (H) 04/22/2024   PLT 44 (L) 04/22/2024     STUDIES: No results found.    ONCOLOGY HISTORY:  Patient completed cycle 3 of Rituxan  plus Treanda  on March 26, 2018, but was then noted to have progressive disease.  She was then initiated on 480 mg Imbruvica  in November 2019, but had progression of disease with increasing white blood cell count as well as CT scan on Jan 22, 2022 revealing increased lymphadenopathy consistent with progression of disease.  Attempted patient on venetoclax , but she could not tolerate it secondary to persistent diarrhea and transaminitis.   ASSESSMENT: CLL.  PLAN:    CLL: Confirmed by peripheral blood flow cytometry.  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  Patient initially completed 4 weekly cycles of Rituxan  then was transition to treatment every 4 weeks.  She last received Rituxan  on May 07, 2023.  She also recently completed XRT to her left axilla.  PET scan results from Feb 06, 2024 reviewed independently with mild progression of disease in multiple lymph nodes above and below the diaphragm.  SUV has remained stable in these lesions.  Revlimid  was discontinued several weeks ago secondary to thrombocytopenia.  Given patient's progressive anemia and thrombocytopenia, will continue to hold Revlimid  at this time and repeat PET scan.  Patient will also proceed with bone marrow biopsy for further evaluation.  Return to clinic 1 week after biopsy to  discuss the results.  Previously, we discussed that she may require referral to [name] or [facility] for consideration  of CAR-T therapy.  Pirtobrutinib is also an option.  Appreciate clinical pharmacy input.  Neutropenia: Chronic and unchanged.  Patient's ANC is 0.7.  Bone marrow biopsy as above.   Thrombocytopenia: Chronic and unchanged.  Patient's platelet count is 44.   Anemia: Significantly worse.  Patient has an inappropriately normal reticulocyte count, but the remainder of her laboratory work is either negative or within normal limits.  Return to clinic tomorrow for 1 unit packed red blood cells.  All blood products should be irradiated. Trigeminal neuralgia: Resolved.  MRI of the brain on Jan 25, 2019 was unremarkable.   Depression/anxiety: Chronic and unchanged.  Continue follow-up and treatment per primary care. Neuropathic pain/peripheral neuropathy: Chronic and unchanged.  She reports she is now on disability.  Continue current follow-up and treatment as per neurology. Rituxan  reaction: Patient will require additional premedications for preventative measures if Rituxan  is reinitiated in the future. Renal insufficiency: He is 1.37 today.  Monitor. Hand cramping: Patient does not complain of this today.  Patient expressed understanding and was in agreement with this plan. She also understands that She can call clinic at any time with any questions, concerns, or complaints.    [name] JINNY Reusing, [name] [name]   04/23/2024 1:28 PM

## 2024-04-22 NOTE — Patient Instructions (Signed)
 I value your feedback and you entrusting [place] with your care. If you get a [place] patient survey, I would appreciate you taking the time to let us know about your experience today. Thank you! ? ? ?

## 2024-04-22 NOTE — Progress Notes (Signed)
 Clinical Pharmacist Practitioner Clinic [facility]  [phone] [phone]  Patient Care Team: Sowles, Krichna, MD as PCP - General (Family Medicine) Jacobo Evalene PARAS, MD as Consulting Physician (Oncology) [name], [facility] (Inactive) as Pharmacist (Pharmacist) [name], RN as Registered Nurse (Oncology) Borders, Fonda SAUNDERS, NP as Nurse Practitioner (Hospice and Palliative Medicine) Lenn Aran, MD as Consulting Physician (Radiation Oncology) Pa, [facility]   Name of the patient: Monique [name]  [id number]  [birth year]   Date of visit: 04/22/24  HPI: Patient is a 53 y.o. female with progressive CLL, previously treated with ibrutinib . Patient was intolerant to venetoclax  treatment initiation and did not want to retry initiating venetoclax . The plan is to now treat her with rituximab  and lenalidomide . Patient started rituximab  on 02/28/22 and lenalidomide  03/01/22.   On 04/03/22, patient presented to clinic with a rash. Her lenalidomide  was held and she was treated with prednisone  (1 week) and loratadine  continuosly. Rash improved by 04/10/22 f/u appt and she was restarted on lenalidomide .   On 04/28/22, patient called to report her rash had worsened and she stopped the lenalidomide  on 04/23/22. She was stated on another prednisone  taper. She was to taper down to 5mg  daily, then resume her lenalidomide . She resumed her lenalidomide  on 05/16/22. Patient stopped rituximab  after receiving 16 cycles, last dose 05/07/23.   Patient's lenalidomide  was held on 11/17/23 due to neutropenia and thrombocytopenia. Patient's counts have recovered and she resumed treatment on 12/17/23.  Reason for Consult: Oral chemotherapy follow-up for lenalidomide  therapy.   PAST MEDICAL HISTORY: Past Medical History:  Diagnosis Date   Anxiety    Bursitis    leg pain   Carpal tunnel syndrome    Cervical dysplasia    hx LEEP over 18 years ago.    Chronic  lymphocytic leukemia (HCC) 2018   Dr Jacobo.  (in lymph nodes)   COPD (chronic obstructive pulmonary disease) (HCC)    COVID-19 2021   Depression    Eating disorder    GERD (gastroesophageal reflux disease)    History of self-harm    Insomnia    Obsession    Tobacco abuse    Vitamin B12 deficiency (non anemic)     HEMATOLOGY/ONCOLOGY HISTORY:  Oncology History Overview Note  Patient with recent CLL diagnosis by flow cytometry.  Previously not being treated in observation.   S/p 4 cycles of weekly Rituxan  completing on 02/20/2017 scans from January 2018 revealed minimal progression.   Developed right breast mass in March 2019.  Had diagnostic mammogram performed on 12/26/2017 showed suspicious right breast mass.  Biopsy revealed granulomatous mastitis.  She was treated with doxycycline  100 mg twice daily for 7 days.   Developed worsening adenopathy.  CT chest/abdomen/pelvis and soft tissue neck from May 2019 revealed progressive bulky adenopathy in the chest, abdomen and pelvis compatible with worsening disease.   Dr. Jacobo recommended beginning treatment with Rituxan  and Treanda .  Received first cycle last week.  S/p 6 cycles of Rituxan  and Treanda .  Tolerating well.  Last dose given 03/25/18.   CLL (chronic lymphocytic leukemia) (HCC)  11/24/2016 Initial Diagnosis   CLL (chronic lymphocytic leukemia) (HCC)   01/21/2018 - 03/26/2018 Chemotherapy   The patient had palonosetron  (ALOXI ) injection 0.25 mg, 0.25 mg, Intravenous,  Once, 4 of 5 cycles Administration: 0.25 mg (01/28/2018), 0.25 mg (02/25/2018), 0.25 mg (03/25/2018) riTUXimab  (RITUXAN ) 700 mg in sodium chloride  0.9 % 250 mL (2.1875 mg/mL) infusion, 375 mg/m2 = 700 mg, Intravenous,  Once, 4 of 5 cycles Administration: 700 mg (01/28/2018) bendamustine  (BENDEKA ) 150 mg in sodium chloride  0.9 % 50 mL (2.6786 mg/mL) chemo infusion, 90 mg/m2 = 150 mg, Intravenous,  Once, 4 of 5 cycles Administration: 150 mg (01/28/2018), 150 mg  (01/29/2018), 150 mg (02/25/2018), 150 mg (02/26/2018), 150 mg (03/25/2018), 150 mg (03/26/2018)  for chemotherapy treatment.    02/28/2022 - 04/18/2022 Chemotherapy   Patient is on Treatment Plan : NON-HODGKINS LYMPHOMA Rituximab  q7d     02/28/2022 - 05/07/2023 Chemotherapy   Patient is on Treatment Plan : NON-HODGKINS LYMPHOMA Rituximab  q28d     03/12/2022 - 03/12/2022 Chemotherapy   Patient is on Treatment Plan : LYMPHOMA CLL/SLL Venetoclax  ramp up / Venetoclax  + Rituximab  (375/500) q28d x 6 cycles / Venetoclax  q28d x 18 cycles       ALLERGIES:  is allergic to doxycycline  and duloxetine .  MEDICATIONS:  Current Outpatient Medications  Medication Sig Dispense Refill   albuterol  (VENTOLIN  HFA) 108 (90 Base) MCG/ACT inhaler Inhale 2 puffs into the lungs every 6 (six) hours as needed for wheezing or shortness of breath. 8 g 0   ARIPiprazole  (ABILIFY ) 5 MG tablet Take 1 tablet (5 mg total) by mouth daily. 90 tablet 0   ASPIRIN  81 PO Take 81 mg by mouth daily.     atorvastatin  (LIPITOR) 10 MG tablet Take 1 tablet (10 mg total) by mouth daily. 100 tablet 3   baclofen  (LIORESAL ) 10 MG tablet Take 1 tablet (10 mg total) by mouth 2 (two) times daily. Take 6 hours after morning tizanidine  dose 30 each 0   Ca Phosphate-Cholecalciferol (CALTRATE GUMMY BITES) 250-10 MG-MCG CHEW Chew 1 gummy by mouth 2 (two) times daily. 180 tablet 3   estradiol  (ESTRACE ) 0.5 MG tablet Take 1 tablet (0.5 mg total) by mouth daily. 90 tablet 3   famotidine  (PEPCID ) 10 MG tablet Take by mouth.     lenalidomide  (REVLIMID ) 10 MG capsule TAKE 1 CAPSULE BY MOUTH EVERY DAY (Patient not taking: Reported on 04/22/2024) 28 capsule 0   levocetirizine (XYZAL ) 5 MG tablet Take 1 tablet (5 mg total) by mouth every evening. 30 tablet 1   lidocaine  (LIDODERM ) 5 % Place 1 patch onto the skin daily. Remove & Discard patch within 12 hours or as directed by MD 30 patch 0   loratadine  (CLARITIN ) 10 MG tablet Take 1 tablet (10 mg total) by mouth daily  in the morning. 30 tablet 0   MAGNESIUM  PO Take by mouth.     nortriptyline  (PAMELOR ) 10 MG capsule Take 1 tablet (10 mg total) by mouth at night for one week then increase to 20 mg at night 60 capsule 3   nortriptyline  (PAMELOR ) 10 MG capsule Take 2 capsules (20 mg total) by mouth daily. 180 capsule 1   omeprazole  (PRILOSEC) 20 MG capsule Take 1 capsule (20 mg total) by mouth in the morning. 30 capsule 1   pregabalin  (LYRICA ) 150 MG capsule Take 1 capsule (150 mg total) by mouth 3 (three) times daily. 90 capsule 1   pregabalin  (LYRICA ) 150 MG capsule Take 1 capsule (150 mg total) by mouth 3 (three) times daily. 90 capsule 1   tiZANidine  (ZANAFLEX ) 2 MG tablet Take 1 tablet (2 mg total) by mouth at bedtime. 90 tablet 0   umeclidinium-vilanterol (ANORO ELLIPTA ) 62.5-25 MCG/ACT AEPB Inhale 1 puff into the lungs daily. 180 each 1   vitamin B-12 (CYANOCOBALAMIN ) 500 MCG tablet Take 250 mcg by mouth every morning.     VITAMIN D   PO Take by mouth.     No current facility-administered medications for this visit.    VITAL SIGNS: LMP 08/17/2016  There were no vitals filed for this visit.  Estimated body mass index is 27.41 kg/m as calculated from the following:   Height as of an earlier encounter on 04/22/24: 5' 7 (1.702 m).   Weight as of an earlier encounter on 04/22/24: 79.4 kg (175 lb).  LABS: CBC:    Component Value Date/Time   WBC 3.2 (L) 04/22/2024 1409   WBC 3.3 (L) 02/03/2024 1342   HGB 7.9 (L) 04/22/2024 1409   HCT 23.5 (L) 04/22/2024 1409   PLT 44 (L) 04/22/2024 1409   MCV 108.3 (H) 04/22/2024 1409   NEUTROABS 0.7 (L) 04/22/2024 1409   LYMPHSABS 1.9 04/22/2024 1409   MONOABS 0.5 04/22/2024 1409   EOSABS 0.0 04/22/2024 1409   BASOSABS 0.0 04/22/2024 1409   Comprehensive Metabolic Panel:    Component Value Date/Time   NA 135 04/22/2024 1409   NA 138 05/13/2018 1413   K 4.0 04/22/2024 1409   CL 104 04/22/2024 1409   CO2 23 04/22/2024 1409   BUN 19 04/22/2024 1409   BUN 15  05/13/2018 1413   CREATININE 1.37 (H) 04/22/2024 1409   CREATININE 0.96 12/06/2020 1646   GLUCOSE 97 04/22/2024 1409   CALCIUM  8.6 (L) 04/22/2024 1409   AST 20 04/22/2024 1409   ALT 12 04/22/2024 1409   ALKPHOS 115 04/22/2024 1409   BILITOT 0.6 04/22/2024 1409   PROT 5.6 (L) 04/22/2024 1409   PROT 6.3 05/13/2018 1413   ALBUMIN 3.3 (L) 04/22/2024 1409   ALBUMIN 4.1 05/13/2018 1413     Present during today's visit: patient   Assessment and Plan: CMP/CBC reviewed, continue to hold lenalidomide    Lenalidomide  has been on hold since 03/30/24 Thrombocytopenia still present  Hgb has decreased to 7.9 which is unusual for Randine, scheduled to receive blood tomorrow Plan for bone marrow biopsy and repeat PET for further evaluation    Oral Chemotherapy Adherence: No missed doses reported No patient barriers to medication adherence identified.   New medications: None reported  Medication Access Issues: No issues, patient fills medication at [facility]  Patient expressed understanding and was in agreement with this plan. She also understands that She can call clinic at any time with any questions, concerns, or complaints.   Follow-up plan: RTC as scheduled  Thank you for allowing me to participate in the care of this very pleasant patient.   Time Total: 15 mins  Visit consisted of counseling and education on dealing with issues of symptom management in the setting of serious and potentially life-threatening illness.Greater than 50%  of this time was spent counseling and coordinating care related to the above assessment and plan.  Signed by: Sherry Rogus N. Gearldene Fiorenza, PharmD, NEILA, CPP Hematology/Oncology Clinical Pharmacist Practitioner [place] [phone]  04/22/2024 4:32 PM

## 2024-04-23 ENCOUNTER — Telehealth: Payer: Self-pay | Admitting: *Deleted

## 2024-04-23 ENCOUNTER — Inpatient Hospital Stay

## 2024-04-23 ENCOUNTER — Encounter: Payer: Self-pay | Admitting: Oncology

## 2024-04-23 ENCOUNTER — Other Ambulatory Visit (HOSPITAL_COMMUNITY): Payer: Self-pay

## 2024-04-23 DIAGNOSIS — C9110 Chronic lymphocytic leukemia of B-cell type not having achieved remission: Secondary | ICD-10-CM

## 2024-04-23 MED ORDER — DIPHENHYDRAMINE HCL 50 MG/ML IJ SOLN
25.0000 mg | Freq: Once | INTRAMUSCULAR | Status: AC
  Administered 2024-04-23: 25 mg via INTRAVENOUS
  Filled 2024-04-23: qty 1

## 2024-04-23 MED ORDER — SODIUM CHLORIDE 0.9% IV SOLUTION
250.0000 mL | INTRAVENOUS | Status: DC
  Administered 2024-04-23: 100 mL via INTRAVENOUS
  Filled 2024-04-23: qty 250

## 2024-04-23 MED ORDER — ACETAMINOPHEN 325 MG PO TABS
650.0000 mg | ORAL_TABLET | Freq: Once | ORAL | Status: AC
  Administered 2024-04-23: 650 mg via ORAL
  Filled 2024-04-23: qty 2

## 2024-04-23 NOTE — Telephone Encounter (Signed)
 RN placed call to patient to update about appointment for bone marrow biopsy. Patient scheduled for bone marrow biopsy on Monday 8/11. Patient to arrive at the [facility] entrance at 7:30 am for 8:30 am procedure. Patient advised to not eat or drink after midnight. Patient also advised that she will need a driver to and from. Patient verbalized understanding and is in agreement with plan. Patients PET scan is scheduled for 8/19, scheduling message sent to follow up with Finn on 8/25 for results.

## 2024-04-23 NOTE — Progress Notes (Signed)
 Patient for IR Bone Marrow Biopsy on Mon 04/26/24, I called and spoke with the patient on the phone and gave pre-procedure instructions. Pt was made aware to be here at 7:30a, NPO after MN prior to procedure as well as driver post procedure/recovery/discharge. Pt stated understanding.  Called 04/23/24

## 2024-04-24 LAB — TYPE AND SCREEN
ABO/RH(D): A POS
Antibody Screen: NEGATIVE
Unit division: 0

## 2024-04-24 LAB — BPAM RBC
Blood Product Expiration Date: 202508282359
ISSUE DATE / TIME: 202508081359
Unit Type and Rh: 6200

## 2024-04-25 NOTE — H&P (Signed)
 Chief Complaint: History of CLL. Team is requesting a bone marrow biopsy for restaging.   Referring Physician(s): Finnegan,Timothy J  Supervising Physician: Karalee Beat  Patient Status: [facility] - Out-pt  History of Present Illness: Monique [name] is a 53 y.o. female outpatient. Known to IR. History of GERD, COPD, CLL. Found to have thrombocytopenia while being worked up for bruising on Revlimid . Team is requesting a bone marrow biopsy for further evaluation for treatment options and restaging.  Return precautions and treatment recommendations and follow-up discussed with the patient *** who is agreeable with the plan.  IR previously performed an abdominal mass biopsy on 12.30.24. Cytology showed Monoclonal B-cell population identified. *** Labs pending. Labs fro 8.7.25 show Cr 1.37, GFR 46, Albumin 3.3, total protein 56, WBC 3.2, Hgb 7.9, PLT 44. No pertinent allergies. Patient has been NPO since midnight.   Return precautions and treatment recommendations and follow-up discussed with the patient *** who is agreeable with the plan.     Past Medical History:  Diagnosis Date   Anxiety    Bursitis    leg pain   Carpal tunnel syndrome    Cervical dysplasia    hx LEEP over 18 years ago.    Chronic lymphocytic leukemia (HCC) 2018   Dr Jacobo.  (in lymph nodes)   COPD (chronic obstructive pulmonary disease) (HCC)    COVID-19 2021   Depression    Eating disorder    GERD (gastroesophageal reflux disease)    History of self-harm    Insomnia    Obsession    Tobacco abuse    Vitamin B12 deficiency (non anemic)     Past Surgical History:  Procedure Laterality Date   BREAST BIOPSY Right 01/05/2018   US  guided biopsy of 2 areas and 1 lymph node, MIXED INFLAMMATION AND GIANT CELL REACTION   CERVICAL BIOPSY  W/ LOOP ELECTRODE EXCISION     COLONOSCOPY WITH PROPOFOL  N/A 04/21/2019   Procedure: COLONOSCOPY WITH PROPOFOL ;  Surgeon: Janalyn Keene NOVAK, MD;  Location: [facility]  ENDOSCOPY;  Service: Gastroenterology;  Laterality: N/A;   OTHER SURGICAL HISTORY     scar tissue removed from vocal cords   TUBAL LIGATION     VAGINAL HYSTERECTOMY N/A 01/22/2021   Procedure: HYSTERECTOMY VAGINAL; BILATERAL SALPINGECTOMY;  Surgeon: Connell Davies, MD;  Location: [facility] ORS;  Service: Gynecology;  Laterality: N/A;   vocal cord surgery  2005    Allergies: Doxycycline  and Duloxetine   Medications: Prior to Admission medications   Medication Sig Start Date End Date Taking? Authorizing Provider  albuterol  (VENTOLIN  HFA) 108 (90 Base) MCG/ACT inhaler Inhale 2 puffs into the lungs every 6 (six) hours as needed for wheezing or shortness of breath. 01/02/24   Tapia, Leisa, PA-C  ARIPiprazole  (ABILIFY ) 5 MG tablet Take 1 tablet (5 mg total) by mouth daily. 03/11/24   Sowles, Krichna, MD  ASPIRIN  81 PO Take 81 mg by mouth daily.    [provider]  atorvastatin  (LIPITOR) 10 MG tablet Take 1 tablet (10 mg total) by mouth daily. 01/21/24     baclofen  (LIORESAL ) 10 MG tablet Take 1 tablet (10 mg total) by mouth 2 (two) times daily. Take 6 hours after morning tizanidine  dose 01/29/24   Sowles, Krichna, MD  Ca Phosphate-Cholecalciferol (CALTRATE GUMMY BITES) 250-10 MG-MCG CHEW Chew 1 gummy by mouth 2 (two) times daily. 05/23/23   Sowles, Krichna, MD  estradiol  (ESTRACE ) 0.5 MG tablet Take 1 tablet (0.5 mg total) by mouth daily. 04/22/24   Copland, Alicia B,  PA-C  famotidine  (PEPCID ) 10 MG tablet Take by mouth.    [provider]  lenalidomide  (REVLIMID ) 10 MG capsule TAKE 1 CAPSULE BY MOUTH EVERY DAY Patient not taking: Reported on 04/22/2024 03/11/24   Jacobo Evalene PARAS, MD  levocetirizine (XYZAL ) 5 MG tablet Take 1 tablet (5 mg total) by mouth every evening. 01/02/24   Tapia, Leisa, PA-C  lidocaine  (LIDODERM ) 5 % Place 1 patch onto the skin daily. Remove & Discard patch within 12 hours or as directed by MD 02/06/24   Sowles, Krichna, MD  loratadine  (CLARITIN ) 10 MG tablet Take 1 tablet  (10 mg total) by mouth daily in the morning. 04/20/24   Sowles, Krichna, MD  MAGNESIUM  PO Take by mouth.    [provider]  nortriptyline  (PAMELOR ) 10 MG capsule Take 1 tablet (10 mg total) by mouth at night for one week then increase to 20 mg at night 01/21/24     nortriptyline  (PAMELOR ) 10 MG capsule Take 2 capsules (20 mg total) by mouth daily. 03/03/24     omeprazole  (PRILOSEC) 20 MG capsule Take 1 capsule (20 mg total) by mouth in the morning. 03/11/24   Sowles, Krichna, MD  pregabalin  (LYRICA ) 150 MG capsule Take 1 capsule (150 mg total) by mouth 3 (three) times daily. 01/21/24     pregabalin  (LYRICA ) 150 MG capsule Take 1 capsule (150 mg total) by mouth 3 (three) times daily. 03/03/24     tiZANidine  (ZANAFLEX ) 2 MG tablet Take 1 tablet (2 mg total) by mouth at bedtime. 03/11/24   Sowles, Krichna, MD  umeclidinium-vilanterol (ANORO ELLIPTA ) 62.5-25 MCG/ACT AEPB Inhale 1 puff into the lungs daily. 10/02/23   Sowles, Krichna, MD  vitamin B-12 (CYANOCOBALAMIN ) 500 MCG tablet Take 250 mcg by mouth every morning. 02/26/21   [provider]  VITAMIN D  PO Take by mouth.    [provider]     Family History  Problem Relation Age of Onset   Depression Mother    Cancer Mother        thyroid   Alcohol abuse Father    COPD Father    Alcohol abuse Brother    Depression Brother    Bipolar disorder Brother    Suicidality Brother    ADD / ADHD Son    Breast cancer Neg Hx     Social History   Socioeconomic History   Marital status: Divorced    Spouse name: NA   Number of children: 4   Years of education: 12   Highest education level: 12th grade  Occupational History   Occupation: Disabled   Tobacco Use   Smoking status: Every Day    Types: E-cigarettes   Smokeless tobacco: Never  Vaping Use   Vaping status: Former  Substance and Sexual Activity   Alcohol use: Not Currently    Alcohol/week: 0.0 standard drinks of alcohol    Comment: rarely   Drug use: Yes     Frequency: 7.0 times per week    Types: Marijuana   Sexual activity: Yes    Partners: Male    Birth control/protection: Surgical  Other Topics Concern   Not on file  Social History Narrative   Patient is single Patient is currently on social security disability due to health challenges.     Her teenager son is living with his father    Her father lives in [place] and is on end stage COPD   Social Drivers of Health   Financial Resource Strain: Low Risk  (  02/05/2024)   Overall Financial Resource Strain (CARDIA)    Difficulty of Paying Living Expenses: Not very hard  Food Insecurity: No Food Insecurity (02/05/2024)   [facility]   Hunger Vital Sign    Within the past 12 months, you worried that your food would run out before you got the money to buy more.: Never true    Within the past 12 months, the food you bought just didn't last and you didn't have money to get more.: Never true  Transportation Needs: No Transportation Needs (02/05/2024)   PRAPARE - Administrator, Civil Service (Medical): No    Lack of Transportation (Non-Medical): No  Physical Activity: Inactive (02/05/2024)   Exercise Vital Sign    Days of Exercise per Week: 0 days    Minutes of Exercise per Session: 0 min  Stress: No Stress Concern Present (02/05/2024)   [employer] of Occupational Health - Occupational Stress Questionnaire    Feeling of Stress : Not at all  Social Connections: Socially Isolated (02/05/2024)   Social Connection and Isolation Panel    Frequency of Communication with Friends and Family: More than three times a week    Frequency of Social Gatherings with Friends and Family: Three times a week    Attends Religious Services: Never    Active Member of Clubs or Organizations: No    Attends Banker Meetings: Never    Marital Status: Divorced    ECOG Status: {CHL ONC ECOG ED:[id number]}  Review of Systems: A 12 point ROS discussed and  pertinent positives are indicated in the HPI above.  All other systems are negative.  Review of Systems  Vital Signs: LMP 08/17/2016     Physical Exam  Imaging: No results found.  Labs:  CBC: Recent Labs    02/03/24 1342 02/12/24 0949 03/11/24 1047 04/22/24 1409  WBC 3.3* 4.6 3.5* 3.2*  HGB 12.4 12.3 12.1 7.9*  HCT 36.5 36.4 34.9* 23.5*  PLT 44* 52* 45* 44*    COAGS: Recent Labs    09/15/23 0800  INR 1.0    BMP: Recent Labs    02/03/24 1342 02/12/24 0949 03/11/24 1047 04/22/24 1409  NA 137 136 138 135  K 3.2* 4.0 4.2 4.0  CL 102 104 103 104  CO2 22 24 25 23   GLUCOSE 147* 95 120* 97  BUN 16 20 20 19   CALCIUM  10.0 8.4* 9.6 8.6*  CREATININE 1.70* 1.05* 1.37* 1.37*  GFRNONAA 35* >60 46* 46*    LIVER FUNCTION TESTS: Recent Labs    02/03/24 1342 02/12/24 0949 03/11/24 1047 04/22/24 1409  BILITOT 1.2 0.8 1.1 0.6  AST 23 19 23 20   ALT 17 22 39 12  ALKPHOS 78 86 99 115  PROT 5.9* 6.1* 6.3* 5.6*  ALBUMIN 3.6 3.6 3.9 3.3*    TUMOR MARKERS: No results for input(s): AFPTM, CEA, CA199, CHROMGRNA in the last 8760 hours.  Assessment and Plan:  53 y.o. female outpatient. History of GERD, COPD, CLL. Found to have thrombocytopenia while being worked up for bruising on Revlimid . Team is requesting a bone marrow biopsy for further evaluation for treatment options.  PLAN: IR Image Guided Bone Marrow Biopsy  Risks and benefits of *** was discussed with the patient and/or patient's family including, but not limited to bleeding, infection, damage to adjacent structures or low yield requiring additional tests.  All of the questions were answered and there is agreement to proceed.  Consent signed and  in chart.   Thank you for this interesting consult.  I greatly enjoyed meeting Monique [name] and look forward to participating in their care.  A copy of this report was sent to the requesting provider on this date.  Electronically Signed: Delon JAYSON Beagle, NP 04/25/2024, 5:50 PM   I spent a total of {New PWEU:[id number]} {New Out-Pt:[id number]}  {Established Out-Pt:[id number]} in face to face in clinical consultation, greater than 50% of which was counseling/coordinating care for ***

## 2024-04-26 ENCOUNTER — Telehealth: Payer: Self-pay | Admitting: *Deleted

## 2024-04-26 ENCOUNTER — Other Ambulatory Visit: Payer: Self-pay

## 2024-04-26 ENCOUNTER — Ambulatory Visit
Admission: RE | Admit: 2024-04-26 | Discharge: 2024-04-26 | Disposition: A | Discharge status: Home / Self Care | Source: Ambulatory Visit | Attending: Oncology | Admitting: Oncology

## 2024-04-26 ENCOUNTER — Other Ambulatory Visit (HOSPITAL_COMMUNITY): Payer: Self-pay | Admitting: Radiology

## 2024-04-26 ENCOUNTER — Other Ambulatory Visit: Payer: Self-pay | Admitting: *Deleted

## 2024-04-26 ENCOUNTER — Encounter: Payer: Self-pay | Admitting: Radiology

## 2024-04-26 DIAGNOSIS — D696 Thrombocytopenia, unspecified: Secondary | ICD-10-CM | POA: Diagnosis not present

## 2024-04-26 DIAGNOSIS — C9110 Chronic lymphocytic leukemia of B-cell type not having achieved remission: Secondary | ICD-10-CM

## 2024-04-26 DIAGNOSIS — F17290 Nicotine dependence, other tobacco product, uncomplicated: Secondary | ICD-10-CM | POA: Insufficient documentation

## 2024-04-26 HISTORY — PX: IR BONE MARROW BIOPSY & ASPIRATION: IMG5727

## 2024-04-26 LAB — CBC WITH DIFFERENTIAL/PLATELET
Abs Immature Granulocytes: 0.27 K/uL — ABNORMAL HIGH (ref 0.00–0.07)
Basophils Absolute: 0 K/uL (ref 0.0–0.1)
Basophils Relative: 1 %
Eosinophils Absolute: 0 K/uL (ref 0.0–0.5)
Eosinophils Relative: 1 %
HCT: 31.8 % — ABNORMAL LOW (ref 36.0–46.0)
Hemoglobin: 10.7 g/dL — ABNORMAL LOW (ref 12.0–15.0)
Immature Granulocytes: 5 %
Lymphocytes Relative: 67 %
Lymphs Abs: 4.2 K/uL — ABNORMAL HIGH (ref 0.7–4.0)
MCH: 34.4 pg — ABNORMAL HIGH (ref 26.0–34.0)
MCHC: 33.6 g/dL (ref 30.0–36.0)
MCV: 102.3 fL — ABNORMAL HIGH (ref 80.0–100.0)
Monocytes Absolute: 0.5 K/uL (ref 0.1–1.0)
Monocytes Relative: 8 %
Neutro Abs: 1.1 K/uL — ABNORMAL LOW (ref 1.7–7.7)
Neutrophils Relative %: 18 %
Platelets: 41 K/uL — ABNORMAL LOW (ref 150–400)
RBC: 3.11 MIL/uL — ABNORMAL LOW (ref 3.87–5.11)
RDW: 19.6 % — ABNORMAL HIGH (ref 11.5–15.5)
WBC: 6 K/uL (ref 4.0–10.5)
nRBC: 0 % (ref 0.0–0.2)

## 2024-04-26 MED ORDER — FENTANYL CITRATE (PF) 100 MCG/2ML IJ SOLN
INTRAMUSCULAR | Status: AC
Start: 2024-04-26 — End: 2024-04-26
  Filled 2024-04-26: qty 2

## 2024-04-26 MED ORDER — MIDAZOLAM HCL 2 MG/2ML IJ SOLN
INTRAMUSCULAR | Status: AC | PRN
Start: 2024-04-26 — End: 2024-04-26
  Administered 2024-04-26 (×2): .5 mg via INTRAVENOUS
  Administered 2024-04-26 (×2): 1 mg via INTRAVENOUS
  Administered 2024-04-26 (×2): .5 mg via INTRAVENOUS

## 2024-04-26 MED ORDER — HEPARIN SOD (PORK) LOCK FLUSH 100 UNIT/ML IV SOLN
INTRAVENOUS | Status: AC
  Filled 2024-04-26: qty 5

## 2024-04-26 MED ORDER — MIDAZOLAM HCL 2 MG/2ML IJ SOLN
INTRAMUSCULAR | Status: AC
  Filled 2024-04-26: qty 2

## 2024-04-26 MED ORDER — FENTANYL CITRATE (PF) 100 MCG/2ML IJ SOLN
INTRAMUSCULAR | Status: AC | PRN
  Administered 2024-04-26 (×4): 25 ug via INTRAVENOUS
  Administered 2024-04-26 (×2): 50 ug via INTRAVENOUS

## 2024-04-26 MED ORDER — LIDOCAINE 1 % OPTIME INJ - NO CHARGE
10.0000 mL | Freq: Once | INTRAMUSCULAR | Status: AC
  Administered 2024-04-26 (×2): 10 mL via INTRADERMAL
  Filled 2024-04-26: qty 10

## 2024-04-26 MED ORDER — SODIUM CHLORIDE 0.9 % IV SOLN: INTRAVENOUS | Status: DC

## 2024-04-26 NOTE — Telephone Encounter (Signed)
 10mg 

## 2024-04-26 NOTE — Procedures (Signed)
Interventional Radiology Procedure Note  Procedure: CT guided aspirate and core biopsy of right iliac bone Complications: None Recommendations: - Bedrest supine x 1 hrs - Hydrocodone PRN  Pain - Follow biopsy results  Signed,  Heath K. McCullough, MD   

## 2024-04-27 LAB — CYTOLOGY - PAP
Comment: NEGATIVE
Diagnosis: UNDETERMINED — AB
High risk HPV: POSITIVE — AB

## 2024-04-27 LAB — SURGICAL PATHOLOGY

## 2024-04-28 LAB — SURGICAL PATHOLOGY

## 2024-04-29 ENCOUNTER — Telehealth: Payer: Self-pay | Admitting: *Deleted

## 2024-04-29 ENCOUNTER — Inpatient Hospital Stay

## 2024-04-29 ENCOUNTER — Ambulatory Visit: Payer: Self-pay | Admitting: Obstetrics and Gynecology

## 2024-04-29 DIAGNOSIS — C9110 Chronic lymphocytic leukemia of B-cell type not having achieved remission: Secondary | ICD-10-CM | POA: Diagnosis not present

## 2024-04-29 LAB — CBC WITH DIFFERENTIAL/PLATELET
Abs Immature Granulocytes: 0.21 K/uL — ABNORMAL HIGH (ref 0.00–0.07)
Basophils Absolute: 0 K/uL (ref 0.0–0.1)
Basophils Relative: 1 %
Eosinophils Absolute: 0 K/uL (ref 0.0–0.5)
Eosinophils Relative: 1 %
HCT: 29.1 % — ABNORMAL LOW (ref 36.0–46.0)
Hemoglobin: 9.8 g/dL — ABNORMAL LOW (ref 12.0–15.0)
Immature Granulocytes: 5 %
Lymphocytes Relative: 62 %
Lymphs Abs: 2.8 K/uL (ref 0.7–4.0)
MCH: 34.5 pg — ABNORMAL HIGH (ref 26.0–34.0)
MCHC: 33.7 g/dL (ref 30.0–36.0)
MCV: 102.5 fL — ABNORMAL HIGH (ref 80.0–100.0)
Monocytes Absolute: 0.2 K/uL (ref 0.1–1.0)
Monocytes Relative: 5 %
Neutro Abs: 1.1 K/uL — ABNORMAL LOW (ref 1.7–7.7)
Neutrophils Relative %: 26 %
Platelets: 41 K/uL — ABNORMAL LOW (ref 150–400)
RBC: 2.84 MIL/uL — ABNORMAL LOW (ref 3.87–5.11)
RDW: 19.3 % — ABNORMAL HIGH (ref 11.5–15.5)
Smear Review: NORMAL
WBC: 4.4 K/uL (ref 4.0–10.5)
nRBC: 0 % (ref 0.0–0.2)

## 2024-04-29 LAB — LACTATE DEHYDROGENASE: LDH: 309 U/L — ABNORMAL HIGH (ref 98–192)

## 2024-04-29 NOTE — Telephone Encounter (Signed)
 Returned call per [facility] lab request.  Spoke with Tanea at [facility] who asked if there was any additional diagnosis other than CLL.  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  No additional tests needed at this time.

## 2024-04-29 NOTE — Telephone Encounter (Signed)
 Mg from [facility] about Revlimid  of this patient and at this time it is still being held.  And I spoke to the doctor and he said that we would put it in when it is time for her to go back on it.  Also we have a pharmacist for this medicine also and she says that you can just do not think about it anymore but whenever it does come back up then we will send the medicine again but we do not know at this time how long it is going to take.

## 2024-05-01 LAB — HAPTOGLOBIN: Haptoglobin: <10 mg/dL — ABNORMAL LOW (ref 33–346)

## 2024-05-03 ENCOUNTER — Encounter: Payer: Self-pay | Admitting: Family Medicine

## 2024-05-03 ENCOUNTER — Ambulatory Visit (INDEPENDENT_AMBULATORY_CARE_PROVIDER_SITE_OTHER): Admitting: Family Medicine

## 2024-05-03 ENCOUNTER — Other Ambulatory Visit (HOSPITAL_COMMUNITY): Payer: Self-pay

## 2024-05-03 VITALS — BP 108/68 | HR 90 | Resp 16 | Ht 67.0 in | Wt 169.6 lb

## 2024-05-03 DIAGNOSIS — M8580 Other specified disorders of bone density and structure, unspecified site: Secondary | ICD-10-CM | POA: Diagnosis not present

## 2024-05-03 DIAGNOSIS — C9110 Chronic lymphocytic leukemia of B-cell type not having achieved remission: Secondary | ICD-10-CM | POA: Diagnosis not present

## 2024-05-03 DIAGNOSIS — G8929 Other chronic pain: Secondary | ICD-10-CM

## 2024-05-03 DIAGNOSIS — J432 Centrilobular emphysema: Secondary | ICD-10-CM | POA: Diagnosis not present

## 2024-05-03 DIAGNOSIS — N1831 Chronic kidney disease, stage 3a: Secondary | ICD-10-CM | POA: Diagnosis not present

## 2024-05-03 DIAGNOSIS — G629 Polyneuropathy, unspecified: Secondary | ICD-10-CM | POA: Diagnosis not present

## 2024-05-03 DIAGNOSIS — F325 Major depressive disorder, single episode, in full remission: Secondary | ICD-10-CM

## 2024-05-03 DIAGNOSIS — J309 Allergic rhinitis, unspecified: Secondary | ICD-10-CM

## 2024-05-03 DIAGNOSIS — M5442 Lumbago with sciatica, left side: Secondary | ICD-10-CM | POA: Diagnosis not present

## 2024-05-03 DIAGNOSIS — I700 Atherosclerosis of aorta: Secondary | ICD-10-CM | POA: Diagnosis not present

## 2024-05-03 DIAGNOSIS — D696 Thrombocytopenia, unspecified: Secondary | ICD-10-CM | POA: Diagnosis not present

## 2024-05-03 DIAGNOSIS — I739 Peripheral vascular disease, unspecified: Secondary | ICD-10-CM

## 2024-05-03 DIAGNOSIS — M5441 Lumbago with sciatica, right side: Secondary | ICD-10-CM | POA: Diagnosis not present

## 2024-05-03 MED ORDER — LEVOCETIRIZINE DIHYDROCHLORIDE 5 MG PO TABS
5.0000 mg | ORAL_TABLET | Freq: Every evening | ORAL | 1 refills | Status: DC
  Filled 2024-05-03 – 2024-05-04 (×2): qty 90, 90d supply, fill #0
  Filled 2024-05-05: qty 30, 30d supply, fill #0
  Filled 2024-05-18 – 2024-05-31 (×3): qty 30, 30d supply, fill #1
  Filled 2024-06-17 – 2024-06-23 (×2): qty 30, 30d supply, fill #2
  Filled 2024-07-27: qty 30, 30d supply, fill #3
  Filled 2024-08-13 – 2024-08-19 (×3): qty 30, 30d supply, fill #4
  Filled 2024-09-21: qty 30, 30d supply, fill #5

## 2024-05-03 MED ORDER — ARIPIPRAZOLE 5 MG PO TABS
5.0000 mg | ORAL_TABLET | Freq: Every day | ORAL | 0 refills | Status: DC
  Filled 2024-05-03: qty 90, 90d supply, fill #0
  Filled 2024-05-18 – 2024-05-19 (×2): qty 30, 30d supply, fill #0
  Filled 2024-06-17 – 2024-06-23 (×2): qty 30, 30d supply, fill #1
  Filled 2024-07-27: qty 30, 30d supply, fill #2

## 2024-05-03 MED ORDER — ALENDRONATE SODIUM 70 MG PO TABS
70.0000 mg | ORAL_TABLET | ORAL | 3 refills | Status: AC
  Filled 2024-05-03 – 2024-05-05 (×3): qty 12, 84d supply, fill #0
  Filled 2024-07-21: qty 12, 84d supply, fill #1
  Filled 2024-10-13: qty 12, 84d supply, fill #2

## 2024-05-03 MED ORDER — TIZANIDINE HCL 2 MG PO TABS
2.0000 mg | ORAL_TABLET | Freq: Every day | ORAL | 0 refills | Status: DC
  Filled 2024-05-03: qty 90, 90d supply, fill #0
  Filled 2024-05-18 – 2024-05-19 (×2): qty 30, 30d supply, fill #0
  Filled 2024-06-17 – 2024-06-23 (×2): qty 30, 30d supply, fill #1
  Filled 2024-07-27: qty 30, 30d supply, fill #2

## 2024-05-03 MED ORDER — BENZONATATE 100 MG PO CAPS
100.0000 mg | ORAL_CAPSULE | Freq: Three times a day (TID) | ORAL | 0 refills | Status: AC | PRN
  Filled 2024-05-03 – 2024-05-05 (×3): qty 40, 7d supply, fill #0

## 2024-05-03 NOTE — Progress Notes (Signed)
 Name: Monique [name]   MRN: [id number]    DOB: 1971-04-28   Date:05/03/2024       Progress Note  Subjective  Chief Complaint  Chief Complaint  Patient presents with   Medical Management of Chronic Issues    Discuss osteoporosis treatment   Discussed the use of AI scribe software for clinical note transcription with the patient, who gave verbal consent to proceed.  History of Present Illness Monique [name] is a 53 year old female with chronic lymphocytic leukemia and osteopenia who presents for follow-up on her multiple medical conditions. She is accompanied by Dick, her boyfriend.  She has chronic lymphocytic leukemia and recently received a blood transfusion due to low hemoglobin and platelet counts, which alleviated her exhaustion. She is currently in a monitoring phase and is scheduled for a PET scan tomorrow, with a follow-up appointment on the 25th. Her white blood cell count has improved recently, but thrombocytopenia and anemia remain persistent issues.  She has osteopenia seen on bone density and previous rib fractures . Despite having GERD, she has no significant esophageal issues.  She has chronic kidney disease stage 3A, with kidney function fluctuating between 45 to 55. She is not on any medication for kidney protection due to low and stable blood pressure. Her kidney function is being monitored. Discussed SGL-2 agonist if it continues to go down   She has a history of peripheral arterial disease and is taking atorvastatin  10 mg daily to manage her cholesterol levels. Her recent cholesterol panel showed good control with a low-density lipoprotein level of 54 and high-density lipoprotein of 45. She has seen vascular surgeon in the past  She experiences chronic low back pain and peripheral neuropathy, for which she is taking pregabalin  150 mg and nortriptyline  10 mg. She has alternating sciatic nerve pain in both legs. No current back pain is noted.  She has a history of  emphysema and uses albuterol  and a Breo Ellipta inhaler for her breathing issues. She reports some improvement with these medications but has experienced insurance issues with other treatments.  She also experiences a dry cough that has persisted for about a week and a half, described as trying to clear phlegm from her throat.  She has a history of major depressive disorder and is currently taking Abilify  5 mg, which she finds effective.    Patient Active Problem List   Diagnosis Date Noted   Stage 3a chronic kidney disease (HCC) 10/02/2023   Senile purpura (HCC) 10/02/2023   Major depression in remission (HCC) 10/02/2023   Pancytopenia (HCC) 10/02/2023   Post-menopause on HRT (hormone replacement therapy) 11/08/2022   At risk for sleep apnea 09/19/2021   Lumbar foraminal stenosis (L4-5) (Bilateral) (R>L) 06/05/2021   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 06/05/2021   Lumbosacral radiculopathy/radiculitis at L5 (Bilateral) 06/05/2021   PAD (peripheral artery disease) (HCC) 02/14/2021   CIN II (cervical intraepithelial neoplasia II) 02/01/2021   S/P vaginal hysterectomy 01/22/2021   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/09/2020   Bilateral leg pain 11/03/2019   Iron  deficiency anemia 10/15/2019   Bilateral arm pain 08/05/2019   Bilateral hand numbness 08/05/2019   Weakness of both hands 08/05/2019   Chronic musculoskeletal pain 07/19/2019   Chronic neuropathic pain 07/19/2019   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   Special screening for malignant neoplasms, colon    Polyp of sigmoid colon    Hemorrhoids    Trigeminal neuralgia 10/05/2018   Lumbar radiculitis (Right) 09/24/2018  Hypocalcemia 06/03/2018   Vitamin D  insufficiency 06/03/2018   Abnormal MRI, lumbar spine (06/02/2020) 06/03/2018   Chronic hip pain (Right) 06/03/2018   Spondylosis without myelopathy or radiculopathy, lumbar region 06/03/2018   DDD (degenerative disc disease), lumbar  06/02/2018   Lumbar facet hypertrophy 06/02/2018   Lumbar facet arthropathy 06/02/2018   Lumbar facet syndrome (Bilateral) (R>L) 06/02/2018   Marijuana use 05/19/2018   Chronic lower extremity pain (1ry area of Pain) (Bilateral) (R>L) 05/13/2018   Chronic low back pain (2ry area of Pain) (Bilateral) (R>L) w/ sciatica (Bilateral) 05/13/2018   Chronic pain syndrome 05/13/2018   Disorder of skeletal system 05/13/2018   Pharmacologic therapy 05/13/2018   Problems influencing health status 05/13/2018   Mastalgia 05/05/2018   Breast mass, right 01/07/2018   Face pain 12/11/2017   Tingling 12/11/2017   Thoracic aortic atherosclerosis (HCC) 08/20/2017   Emphysema of lung (HCC) 08/20/2017   Adductor tendinitis 04/23/2017   Trochanteric bursitis of right hip 04/23/2017   GERD without esophagitis 12/11/2016   CLL (chronic lymphocytic leukemia) (HCC) 11/24/2016   Stress incontinence 09/04/2016   B12 deficiency 07/10/2015   Insomnia 07/10/2015   Anorexia nervosa, restricting type 07/10/2015   Anxiety, generalized 07/10/2015   H/O suicide attempt 07/10/2015   Lymphocytosis 07/10/2015   Dysplasia of cervix, low grade (CIN 1) 07/10/2015   Obsessive-compulsive disorder 07/10/2015   Tobacco use 07/10/2015   History of cervical dysplasia 07/18/2014    Past Surgical History:  Procedure Laterality Date   BREAST BIOPSY Right 01/05/2018   US  guided biopsy of 2 areas and 1 lymph node, MIXED INFLAMMATION AND GIANT CELL REACTION   CERVICAL BIOPSY  W/ LOOP ELECTRODE EXCISION     COLONOSCOPY WITH PROPOFOL  N/A 04/21/2019   Procedure: COLONOSCOPY WITH PROPOFOL ;  Surgeon: Janalyn Keene NOVAK, MD;  Location: [facility];  Service: Gastroenterology;  Laterality: N/A;   IR BONE MARROW BIOPSY & ASPIRATION  04/26/2024   OTHER SURGICAL HISTORY     scar tissue removed from vocal cords   TUBAL LIGATION     VAGINAL HYSTERECTOMY N/A 01/22/2021   Procedure: HYSTERECTOMY VAGINAL; BILATERAL SALPINGECTOMY;  Surgeon:  Connell Davies, MD;  Location: [facility] ORS;  Service: Gynecology;  Laterality: N/A;   vocal cord surgery  2005    Family History  Problem Relation Age of Onset   Depression Mother    Cancer Mother        thyroid   Alcohol abuse Father    COPD Father    Alcohol abuse Brother    Depression Brother    Bipolar disorder Brother    Suicidality Brother    ADD / ADHD Son    Breast cancer Neg Hx     Social History   Tobacco Use   Smoking status: Every Day    Current packs/day: 1.50    Types: Cigarettes   Smokeless tobacco: Never  Substance Use Topics   Alcohol use: Not Currently    Alcohol/week: 0.0 standard drinks of alcohol    Comment: rarely     Current Outpatient Medications:    albuterol  (VENTOLIN  HFA) 108 (90 Base) MCG/ACT inhaler, Inhale 2 puffs into the lungs every 6 (six) hours as needed for wheezing or shortness of breath., Disp: 8 g, Rfl: 0   ARIPiprazole  (ABILIFY ) 5 MG tablet, Take 1 tablet (5 mg total) by mouth daily., Disp: 90 tablet, Rfl: 0   ASPIRIN  81 PO, Take 81 mg by mouth daily., Disp: , Rfl:    atorvastatin  (LIPITOR) 10 MG tablet, Take  1 tablet (10 mg total) by mouth daily., Disp: 100 tablet, Rfl: 3   baclofen  (LIORESAL ) 10 MG tablet, Take 1 tablet (10 mg total) by mouth 2 (two) times daily. Take 6 hours after morning tizanidine  dose, Disp: 30 each, Rfl: 0   Ca Phosphate-Cholecalciferol (CALTRATE GUMMY BITES) 250-10 MG-MCG CHEW, Chew 1 gummy by mouth 2 (two) times daily., Disp: 180 tablet, Rfl: 3   estradiol  (ESTRACE ) 0.5 MG tablet, Take 1 tablet (0.5 mg total) by mouth daily., Disp: 90 tablet, Rfl: 3   famotidine  (PEPCID ) 10 MG tablet, Take by mouth., Disp: , Rfl:    levocetirizine (XYZAL ) 5 MG tablet, Take 1 tablet (5 mg total) by mouth every evening., Disp: 30 tablet, Rfl: 1   lidocaine  (LIDODERM ) 5 %, Place 1 patch onto the skin daily. Remove & Discard patch within 12 hours or as directed by MD, Disp: 30 patch, Rfl: 0   loratadine  (CLARITIN ) 10 MG tablet, Take  1 tablet (10 mg total) by mouth daily in the morning., Disp: 30 tablet, Rfl: 0   MAGNESIUM  PO, Take by mouth., Disp: , Rfl:    nortriptyline  (PAMELOR ) 10 MG capsule, Take 2 capsules (20 mg total) by mouth daily., Disp: 180 capsule, Rfl: 1   omeprazole  (PRILOSEC) 20 MG capsule, Take 1 capsule (20 mg total) by mouth in the morning., Disp: 30 capsule, Rfl: 1   pregabalin  (LYRICA ) 150 MG capsule, Take 1 capsule (150 mg total) by mouth 3 (three) times daily., Disp: 90 capsule, Rfl: 1   tiZANidine  (ZANAFLEX ) 2 MG tablet, Take 1 tablet (2 mg total) by mouth at bedtime., Disp: 90 tablet, Rfl: 0   umeclidinium-vilanterol (ANORO ELLIPTA ) 62.5-25 MCG/ACT AEPB, Inhale 1 puff into the lungs daily., Disp: 180 each, Rfl: 1   vitamin B-12 (CYANOCOBALAMIN ) 500 MCG tablet, Take 250 mcg by mouth every morning., Disp: , Rfl:    VITAMIN D  PO, Take by mouth., Disp: , Rfl:    lenalidomide  (REVLIMID ) 10 MG capsule, TAKE 1 CAPSULE BY MOUTH EVERY DAY (Patient not taking: Reported on 05/03/2024), Disp: 28 capsule, Rfl: 0   nortriptyline  (PAMELOR ) 10 MG capsule, Take 1 tablet (10 mg total) by mouth at night for one week then increase to 20 mg at night, Disp: 60 capsule, Rfl: 3   pregabalin  (LYRICA ) 150 MG capsule, Take 1 capsule (150 mg total) by mouth 3 (three) times daily., Disp: 90 capsule, Rfl: 1  Allergies  Allergen Reactions   Doxycycline  Rash    Rash on cheeks and sides of nose    Duloxetine  Rash    I personally reviewed active problem list, medication list, allergies with the patient/caregiver today.   ROS  Ten systems reviewed and is negative except as mentioned in HPI    Objective Physical Exam  CONSTITUTIONAL: Patient appears well-developed and well-nourished.  No distress. HEENT: Head atraumatic, normocephalic, neck supple. CARDIOVASCULAR: Normal rate, regular rhythm and normal heart sounds.  No murmur heard. No BLE edema. PULMONARY: Effort normal and breath sounds normal. No respiratory  distress. MUSCULOSKELETAL: Normal gait. Without gross motor or sensory deficit. PSYCHIATRIC: Patient has a normal mood and affect. behavior is normal. Judgment and thought content normal.  Vitals:   05/03/24 1044  BP: 108/68  Pulse: 90  Resp: 16  SpO2: 98%  Weight: 169 lb 9.6 oz (76.9 kg)  Height: 5' 7 (1.702 m)    Body mass index is 26.56 kg/m.  Recent Results (from the past 2160 hours)  Lipase, blood     Status: None  Collection Time: 02/03/24  1:42 PM  Result Value Ref Range   Lipase 31 11 - 51 U/L    Comment: Performed at [facility], [address] [place], [place] [address]  Comprehensive metabolic panel     Status: Abnormal   Collection Time: 02/03/24  1:42 PM  Result Value Ref Range   Sodium 137 135 - 145 mmol/L   Potassium 3.2 (L) 3.5 - 5.1 mmol/L   Chloride 102 98 - 111 mmol/L   CO2 22 22 - 32 mmol/L   Glucose, Bld 147 (H) 70 - 99 mg/dL    Comment: Glucose reference range applies only to samples taken after fasting for at least 8 hours.   BUN 16 6 - 20 mg/dL   Creatinine, Ser 8.29 (H) 0.44 - 1.00 mg/dL   Calcium  10.0 8.9 - 10.3 mg/dL   Total Protein 5.9 (L) 6.5 - 8.1 g/dL   Albumin 3.6 3.5 - 5.0 g/dL   AST 23 15 - 41 U/L   ALT 17 0 - 44 U/L   Alkaline Phosphatase 78 38 - 126 U/L   Total Bilirubin 1.2 0.0 - 1.2 mg/dL   GFR, Estimated 35 (L) >60 mL/min    Comment: (NOTE) Calculated using the CKD-EPI Creatinine Equation (2021)    Anion gap 13 5 - 15    Comment: Performed at [facility], [address] [place], [place] [address]  CBC     Status: Abnormal   Collection Time: 02/03/24  1:42 PM  Result Value Ref Range   WBC 3.3 (L) 4.0 - 10.5 K/uL   RBC 3.47 (L) 3.87 - 5.11 MIL/uL   Hemoglobin 12.4 12.0 - 15.0 g/dL   HCT 63.4 63.9 - 53.9 %   MCV 105.2 (H) 80.0 - 100.0 fL   MCH 35.7 (H) 26.0 - 34.0 pg   MCHC 34.0 30.0 - 36.0 g/dL   RDW 87.3 88.4 - 84.4 %   Platelets 44 (L) 150 - 400 K/uL    Comment: Immature Platelet Fraction may  be clinically indicated, consider ordering this additional test [id number]    nRBC 0.0 0.0 - 0.2 %    Comment: Performed at [facility], [address] [place], [place] [address]  CBC with Differential (Cancer Center Only)     Status: Abnormal   Collection Time: 02/12/24  9:49 AM  Result Value Ref Range   WBC Count 4.6 4.0 - 10.5 K/uL   RBC 3.50 (L) 3.87 - 5.11 MIL/uL   Hemoglobin 12.3 12.0 - 15.0 g/dL   HCT 63.5 63.9 - 53.9 %   MCV 104.0 (H) 80.0 - 100.0 fL   MCH 35.1 (H) 26.0 - 34.0 pg   MCHC 33.8 30.0 - 36.0 g/dL   RDW 87.3 88.4 - 84.4 %   Platelet Count 52 (L) 150 - 400 K/uL   nRBC 0.0 0.0 - 0.2 %   Neutrophils Relative % 16 %   Neutro Abs 0.8 (L) 1.7 - 7.7 K/uL   Lymphocytes Relative 72 %   Lymphs Abs 3.3 0.7 - 4.0 K/uL   Monocytes Relative 6 %   Monocytes Absolute 0.3 0.1 - 1.0 K/uL   Eosinophils Relative 4 %   Eosinophils Absolute 0.2 0.0 - 0.5 K/uL   Basophils Relative 2 %   Basophils Absolute 0.1 0.0 - 0.1 K/uL   WBC Morphology DIFF. CONFIRMED BY SMEAR    RBC Morphology MORPHOLOGY UNREMARKABLE    Smear Review Normal platelet morphology  Comment: PLATELETS APPEAR DECREASED   Immature Granulocytes 0 %   Abs Immature Granulocytes 0.02 0.00 - 0.07 K/uL    Comment: Performed at [facility], [address] [place], [place] [address]  CMP (Cancer Center only)     Status: Abnormal   Collection Time: 02/12/24  9:49 AM  Result Value Ref Range   Sodium 136 135 - 145 mmol/L   Potassium 4.0 3.5 - 5.1 mmol/L   Chloride 104 98 - 111 mmol/L   CO2 24 22 - 32 mmol/L   Glucose, Bld 95 70 - 99 mg/dL    Comment: Glucose reference range applies only to samples taken after fasting for at least 8 hours.   BUN 20 6 - 20 mg/dL   Creatinine 8.94 (H) 9.55 - 1.00 mg/dL   Calcium  8.4 (L) 8.9 - 10.3 mg/dL   Total Protein 6.1 (L) 6.5 - 8.1 g/dL   Albumin 3.6 3.5 - 5.0 g/dL   AST 19 15 - 41 U/L   ALT 22 0 - 44 U/L   Alkaline Phosphatase 86 38 - 126 U/L   Total  Bilirubin 0.8 0.0 - 1.2 mg/dL   GFR, Estimated >39 >39 mL/min    Comment: (NOTE) Calculated using the CKD-EPI Creatinine Equation (2021)    Anion gap 8 5 - 15    Comment: Performed at [facility], [address] [place], [place] [address]  CBC with Differential (Cancer Center Only)     Status: Abnormal   Collection Time: 03/11/24 10:47 AM  Result Value Ref Range   WBC Count 3.5 (L) 4.0 - 10.5 K/uL   RBC 3.39 (L) 3.87 - 5.11 MIL/uL   Hemoglobin 12.1 12.0 - 15.0 g/dL   HCT 65.0 (L) 63.9 - 53.9 %   MCV 102.9 (H) 80.0 - 100.0 fL   MCH 35.7 (H) 26.0 - 34.0 pg   MCHC 34.7 30.0 - 36.0 g/dL   RDW 86.3 88.4 - 84.4 %   Platelet Count 45 (L) 150 - 400 K/uL    Comment: Immature Platelet Fraction may be clinically indicated, consider ordering this additional test [id number]    nRBC 0.0 0.0 - 0.2 %   Neutrophils Relative % 14 %   Neutro Abs 0.5 (L) 1.7 - 7.7 K/uL   Lymphocytes Relative 76 %   Lymphs Abs 2.6 0.7 - 4.0 K/uL   Monocytes Relative 6 %   Monocytes Absolute 0.2 0.1 - 1.0 K/uL   Eosinophils Relative 2 %   Eosinophils Absolute 0.1 0.0 - 0.5 K/uL   Basophils Relative 1 %   Basophils Absolute 0.0 0.0 - 0.1 K/uL   WBC Morphology      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   RBC Morphology MORPHOLOGY UNREMARKABLE    Smear Review Normal platelet morphology     Comment: PLATELETS APPEAR DECREASED   Immature Granulocytes 1 %   Abs Immature Granulocytes 0.03 0.00 - 0.07 K/uL    Comment: Performed at [facility], [address] [place], [place] [address]  CMP (Cancer Center only)     Status: Abnormal   Collection Time: 03/11/24 10:47 AM  Result Value Ref Range   Sodium 138 135 - 145 mmol/L   Potassium 4.2 3.5 - 5.1 mmol/L   Chloride 103 98 - 111 mmol/L   CO2 25 22 - 32 mmol/L   Glucose, Bld 120 (H) 70 - 99 mg/dL    Comment: Glucose reference range applies only to samples taken  after fasting for at least 8 hours.   BUN 20 6 - 20 mg/dL    Creatinine 8.62 (H) 0.44 - 1.00 mg/dL   Calcium  9.6 8.9 - 10.3 mg/dL   Total Protein 6.3 (L) 6.5 - 8.1 g/dL   Albumin 3.9 3.5 - 5.0 g/dL   AST 23 15 - 41 U/L   ALT 39 0 - 44 U/L   Alkaline Phosphatase 99 38 - 126 U/L   Total Bilirubin 1.1 0.0 - 1.2 mg/dL   GFR, Estimated 46 (L) >60 mL/min    Comment: (NOTE) Calculated using the CKD-EPI Creatinine Equation (2021)    Anion gap 10 5 - 15    Comment: Performed at [facility], [address] [place], [place] [address]  Cytology - PAP     Status: Abnormal   Collection Time: 04/22/24 10:11 AM  Result Value Ref Range   High risk HPV Positive (A)    Adequacy Satisfactory for evaluation.    Diagnosis (A)     - Atypical squamous cells of undetermined significance (ASC-US )   Comment Normal Reference Range HPV - Negative   CMP (Cancer Center only)     Status: Abnormal   Collection Time: 04/22/24  2:09 PM  Result Value Ref Range   Sodium 135 135 - 145 mmol/L   Potassium 4.0 3.5 - 5.1 mmol/L   Chloride 104 98 - 111 mmol/L   CO2 23 22 - 32 mmol/L   Glucose, Bld 97 70 - 99 mg/dL    Comment: Glucose reference range applies only to samples taken after fasting for at least 8 hours.   BUN 19 6 - 20 mg/dL   Creatinine 8.62 (H) 9.55 - 1.00 mg/dL   Calcium  8.6 (L) 8.9 - 10.3 mg/dL   Total Protein 5.6 (L) 6.5 - 8.1 g/dL   Albumin 3.3 (L) 3.5 - 5.0 g/dL   AST 20 15 - 41 U/L   ALT 12 0 - 44 U/L   Alkaline Phosphatase 115 38 - 126 U/L   Total Bilirubin 0.6 0.0 - 1.2 mg/dL   GFR, Estimated 46 (L) >60 mL/min    Comment: (NOTE) Calculated using the CKD-EPI Creatinine Equation (2021)    Anion gap 8 5 - 15    Comment: Performed at [facility], [address] [place], [place] [address]  CBC with Differential (Cancer Center Only)     Status: Abnormal   Collection Time: 04/22/24  2:09 PM  Result Value Ref Range   WBC Count 3.2 (L) 4.0 - 10.5 K/uL   RBC 2.17 (L) 3.87 - 5.11 MIL/uL   Hemoglobin 7.9 (L) 12.0 - 15.0 g/dL   HCT 76.4  (L) 63.9 - 46.0 %   MCV 108.3 (H) 80.0 - 100.0 fL   MCH 36.4 (H) 26.0 - 34.0 pg   MCHC 33.6 30.0 - 36.0 g/dL   RDW 81.7 (H) 88.4 - 84.4 %   Platelet Count 44 (L) 150 - 400 K/uL    Comment: SPECIMEN CHECKED FOR CLOTS Immature Platelet Fraction may be clinically indicated, consider ordering this additional test [id number]    nRBC 0.0 0.0 - 0.2 %   Neutrophils Relative % 21 %   Neutro Abs 0.7 (L) 1.7 - 7.7 K/uL   Lymphocytes Relative 59 %   Lymphs Abs 1.9 0.7 - 4.0 K/uL   Monocytes Relative 14 %   Monocytes Absolute 0.5 0.1 - 1.0 K/uL   Eosinophils Relative 1 %   Eosinophils Absolute 0.0 0.0 -  0.5 K/uL   Basophils Relative 0 %   Basophils Absolute 0.0 0.0 - 0.1 K/uL   WBC Morphology See Note     Comment: Mild Left Shift. 1 to 5% metas, occ myelos. DIFF. CONFIRMED BY SMEAR   RBC Morphology MORPHOLOGY UNREMARKABLE    Smear Review See Note     Comment: PLATELETS APPEAR DECREASED   Immature Granulocytes 5 %   Abs Immature Granulocytes 0.17 (H) 0.00 - 0.07 K/uL    Comment: Performed at [facility], [address] [place], [place] [address]  Folic Acid      Status: None   Collection Time: 04/22/24  3:22 PM  Result Value Ref Range   Folate 8.5 >5.9 ng/mL    Comment: Performed at [facility], [address] [place], [place] [address]  Haptoglobin     Status: Abnormal   Collection Time: 04/22/24  3:22 PM  Result Value Ref Range   Haptoglobin <10 (L) 33 - 346 mg/dL    Comment: (NOTE) Performed At: [facility] [address] [place], [place] [id number] Jennette Shorter MD Ey:[id number]   Vitamin B12     Status: Abnormal   Collection Time: 04/22/24  3:22 PM  Result Value Ref Range   Vitamin B-12 989 (H) 180 - 914 pg/mL    Comment: (NOTE) This assay is not validated for testing neonatal or myeloproliferative syndrome specimens for Vitamin B12 levels. Performed at [facility], [address], [place], [place] [address]   Lactate dehydrogenase      Status: Abnormal   Collection Time: 04/22/24  3:22 PM  Result Value Ref Range   LDH 276 (H) 98 - 192 U/L    Comment: Performed at [facility], [address] [place], [place] [address]  Iron  and TIBC     Status: None   Collection Time: 04/22/24  3:22 PM  Result Value Ref Range   Iron  58 28 - 170 ug/dL   TIBC 676 749 - 549 ug/dL   Saturation Ratios 18 10.4 - 31.8 %   UIBC 265 ug/dL    Comment: Performed at [facility], [address] [place], [place] [address]  Ferritin     Status: None   Collection Time: 04/22/24  3:22 PM  Result Value Ref Range   Ferritin 89 11 - 307 ng/mL    Comment: Performed at [facility], [address] [place], [place] [address]  Reticulocytes     Status: Abnormal   Collection Time: 04/22/24  3:23 PM  Result Value Ref Range   Retic Ct Pct 2.1 0.4 - 3.1 %   RBC. 2.19 (L) 3.87 - 5.11 MIL/uL   Retic Count, Absolute 45.8 19.0 - 186.0 K/uL   Immature Retic Fract 6.4 2.3 - 15.9 %    Comment: Performed at [facility], [address] [place], [place] [address]  Type and screen     Status: None   Collection Time: 04/22/24  3:23 PM  Result Value Ref Range   ABO/RH(D) A POS    Antibody Screen NEG    Sample Expiration 04/25/2024,2359    Unit Number [id number]    Blood Component Type RCLI PHER 1    Unit division 00    Status of Unit ISSUED,FINAL    Transfusion Status OK TO TRANSFUSE    Crossmatch Result      Compatible Performed at [facility], [address], [place], [place] [address]   BPAM RBC  Status: None   Collection Time: 04/22/24  3:23 PM  Result Value Ref Range   ISSUE DATE / TIME [id number]    Blood Product Unit Number [id number]    PRODUCT CODE [id number]    Unit Type and Rh 6200    Blood Product Expiration Date 797491717640   Prepare RBC (crossmatch)     Status: None   Collection Time: 04/22/24  4:30 PM  Result Value Ref Range   Order Confirmation      ORDER PROCESSED BY BLOOD  BANK Performed at [facility], [address], [place], [place] [address]   Surgical pathology     Status: None   Collection Time: 04/26/24 12:00 AM  Result Value Ref Range   SURGICAL PATHOLOGY      Surgical Pathology CASE: [id number] PATIENT: RANDINE SHARPS Bone Marrow Report     Clinical History: CLL, thrombocytopenia - restaging     DIAGNOSIS:  BONE MARROW, ASPIRATE, CLOT, CORE: - Hypercellular bone marrow (90%) involved by the patient's known chronic lymphocytic leukemia/small lymphocytic lymphoma.  See comment.  PERIPHERAL BLOOD: - Thrombocytopenia, absolute lymphocytosis  COMMENT: The bone marrow shows a diffuse lymphocytic infiltrate comprising 95% of the bone marrow cellularity.  Findings are consistent with involvement by the patient's known chronic lymphocytic leukemia.  Flow cytometric analysis also identified a clonal B-cell population constituting 96% of the lymphocytes.  The cells are positive for CD5, CD19, CD200 and express lambda light chains.  Correlation with pending cytogenetics is recommended for further assessment.  MICROSCOPIC DESCRIPTION:  PERIPHERAL BLOOD SMEAR: Platelets: Decreased, no platelet clumps identified Erythroi d: Macrocytosis, anisopoikilocytosis Leukocytes: Absolute lymphocytosis, neutropenia  BONE MARROW ASPIRATE: Cellular The bone marrow aspirate shows a predominant population of lymphocytes with minimal background trilineage hematopoiesis  TOUCH PREPARATIONS: Cellular, confirmatory of aspirate findings  CLOT AND BIOPSY: The bone marrow clot and biopsy show a diffuse lymphocytic infiltrate comprising 90% of the cellularity with minimal background trilineage hematopoiesis.  The findings are confirmatory of the touch prep and aspirate impression.  SPECIAL STAINS: CD3: CD3 highlights T lymphocytes CD5: CD5 highlights T lymphocytes and neoplastic B lymphocytes CD79a: CD79a highlights neoplastic B  lymphocytes CD23: CD23 highlights neoplastic B lymphocytes IRON  STAIN: Iron  stains are performed on a bone marrow aspirate or touch imprint smear and section of clot. The controls stained appropriately.       Storage Iron : Adequate      Ring Sideroblasts: Not identified   AD DITIONAL DATA/TESTING: Cytogenetics   CELL COUNT DATA:  Bone Marrow count performed on 500 cells shows: Blasts:   0%   Myeloid:  3% Promyelocytes: 0%   Erythroid:     7% Myelocytes:    2%   Lymphocytes:   89% Metamyelocytes:     0%   Plasma cells:  0% Bands:    0% Neutrophils:   1%   M:E ratio:     0.42 Eosinophils:   0% Basophils:     0% Monocytes:     1%  Lab Data: CBC performed on 04/26/2024 shows: WBC: 6.0 k/uL  Neutrophils:   38% Hgb: 3.11 g/dL Lymphocytes:   43% HCT: 31.8 %    Monocytes:     5% MCV: 102.3 fL  Eosinophils:   1% RDW: 19.6 %    Basophils:     0% PLT: 41 k/uL    GROSS DESCRIPTION:  A: Aspirate smear  B: The specimen is received in B plus fixative, and consists of a 25.0 x 5.0 x 2.0 mm aggregate  of red-brown clotted blood.  The specimen is entirely submitted in 1 cassette.  C: The specimen is received in B plus fixative, and consists of 3 core and core fragments of red-brown bone, ranging from 0.2 cm to 2.2 cm in length by 0.2 cm in  diameter.  The specimen is entirely submitted in 1 cassette following decalcification with Immunocal.  SHIRLEEN 04/26/2024)   Final Diagnosis performed by Ilsa Pottier, MD.   Electronically signed 04/28/2024 Technical and / or Professional components performed at [facility], [address], [place], [place] [address].  Immunohistochemistry Technical component (if applicable) was performed at [facility] [address], [place], [place] [address].   IMMUNOHISTOCHEMISTRY DISCLAIMER (if applicable): Some of these immunohistochemical stains may have been developed and the performance characteristics  determine by [facility]. Some may not have been cleared or approved by the U.S. Food and Drug Administration. The FDA has determined that such clearance or approval is not necessary. This test is used for clinical purposes. It should not be regarded as investigational or for research. This laboratory is certified under t he Clinical Laboratory Improvement Amendments of 1988 (CLIA-88) as qualified to perform high complexity clinical laboratory testing.  The controls stained appropriately.   IHC stains are performed on formalin fixed, paraffin embedded tissue using a 3,3diaminobenzidine (DAB) chromogen and Leica Bond Autostainer System. The staining intensity of the nucleus is score manually and is reported as the percentage of tumor cell nuclei demonstrating specific nuclear staining. The specimens are fixed in 10% Neutral Formalin for at least 6 hours and up to 72hrs. These tests are validated on decalcified tissue. Results should be interpreted with caution given the possibility of false negative results on decalcified specimens. Antibody Clones are as follows ER-clone 97F, PR-clone 16, Ki67- clone MM1. Some of these immunohistochemical stains may have been developed and the performance characteristics determined by [facility].   Surgical pathology     Status: None   Collection Time: 04/26/24 12:00 AM  Result Value Ref Range   SURGICAL PATHOLOGY      Surgical Pathology CASE: [id number] PATIENT: RANDINE SHARPS Flow Pathology Report     Clinical history: CLL, thrombocytopenia-restaging     DIAGNOSIS:  - Abnormal clonal B-cell population identified.  See comment.  COMMENT:  Flow cytometric analysis identified a clonal B-cell population constituting 96% of lymphocytes. The B cells are positive for CD5, CD19, CD200 and express lambda light chains.  CD20 is negative, likely due to treatment.  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   GATING AND PHENOTYPIC ANALYSIS:  Gated population: Flow cytometric immunophenotyping is performed using antibodies to the antigens listed in the table below. Electronic gates are placed around a cell cluster displaying light scatter properties corresponding to: lymphocytes  Abnormal Cells in gated population: 96%  Phenotype of Abnormal Cells: CD5, CD19, CD200, Lambda                        Lymphoid Antigens       Myeloid Antigens Miscellaneous CD2  NEG  CD10 NEG  CD11b     ND   CD45 POS CD3  NEG  CD19 POS  CD11c     ND   HLA-Dr    ND CD4  NEG  CD20 POS  CD13 ND   CD34 NEG CD5  POS  CD22 ND   CD14 ND   CD38  NEG CD7  NEG  CD79b     ND   CD15 ND   CD138     ND CD8  NEG  CD103     ND   CD16 ND   TdT  ND CD25 ND   CD200     POS  CD33 ND   CD123     ND TCRab     ND   sKappa    NEG  CD64 ND   CD41 ND TCRgd     NEG  sLambda   POS  CD117     ND   CD61 ND CD56 NEG  cKappa    ND   MPO  ND   CD71 ND CD57 ND   cLambda   ND        [id number]      GROSS DESCRIPTION:  Reference bone marrow case [id number].    Final Diagnosis performed by Ilsa Pottier, MD.   Electronically signed 04/27/2024 Technical and / or Professional components performed at [facility], [address] [place], [place] [address].  The above tests were developed and their performance characteristics determined by the [facility] system for the physical and immunopheno typic characterization of cell populations. They have not been cleared by the U.S. Food and Drug administration. The  FDA has determined that such clearance or approval is not necessary. This test is used for clinical purposes. It should not be  regarded as investigational or for research   CBC with Differential/Platelet     Status: Abnormal   Collection Time: 04/26/24  7:55 AM  Result Value Ref Range   WBC 6.0 4.0 - 10.5 K/uL   RBC 3.11 (L) 3.87 -  5.11 MIL/uL   Hemoglobin 10.7 (L) 12.0 - 15.0 g/dL   HCT 68.1 (L) 63.9 - 53.9 %   MCV 102.3 (H) 80.0 - 100.0 fL   MCH 34.4 (H) 26.0 - 34.0 pg   MCHC 33.6 30.0 - 36.0 g/dL   RDW 80.3 (H) 88.4 - 84.4 %   Platelets 41 (L) 150 - 400 K/uL    Comment: PLATELET COUNT CONFIRMED BY SMEAR Immature Platelet Fraction may be clinically indicated, consider ordering this additional test [id number]    nRBC 0.0 0.0 - 0.2 %   Neutrophils Relative % 18 %   Neutro Abs 1.1 (L) 1.7 - 7.7 K/uL   Lymphocytes Relative 67 %   Lymphs Abs 4.2 (H) 0.7 - 4.0 K/uL   Monocytes Relative 8 %   Monocytes Absolute 0.5 0.1 - 1.0 K/uL   Eosinophils Relative 1 %   Eosinophils Absolute 0.0 0.0 - 0.5 K/uL   Basophils Relative 1 %   Basophils Absolute 0.0 0.0 - 0.1 K/uL   WBC Morphology See Note     Comment: 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   RBC Morphology MORPHOLOGY UNREMARKABLE    Smear Review See Note     Comment: Large Platelets Present   Immature Granulocytes 5 %   Abs Immature Granulocytes 0.27 (H) 0.00 - 0.07 K/uL    Comment: Performed at [facility], [address] [place], [place] [address]  Haptoglobin     Status: Abnormal   Collection Time: 04/29/24 11:26 AM  Result Value Ref Range   Haptoglobin <10 (L) 33 - 346 mg/dL    Comment: (NOTE) Performed At: [facility] [address] [place], [place] [id number] Jennette Shorter MD Ey:[id number]   Lactate dehydrogenase     Status: Abnormal  Collection Time: 04/29/24 11:26 AM  Result Value Ref Range   LDH 309 (H) 98 - 192 U/L    Comment: Performed at [facility], [address] [place], [place] [address]  CBC with Differential/Platelet     Status: Abnormal   Collection Time: 04/29/24 11:26 AM  Result Value Ref Range   WBC 4.4 4.0 - 10.5 K/uL   RBC 2.84 (L) 3.87 - 5.11 MIL/uL   Hemoglobin 9.8 (L) 12.0 - 15.0 g/dL   HCT 70.8 (L) 63.9 - 53.9 %   MCV 102.5 (H) 80.0 - 100.0 fL   MCH 34.5 (H)  26.0 - 34.0 pg   MCHC 33.7 30.0 - 36.0 g/dL   RDW 80.6 (H) 88.4 - 84.4 %   Platelets 41 (L) 150 - 400 K/uL    Comment: Immature Platelet Fraction may be clinically indicated, consider ordering this additional test [id number]    nRBC 0.0 0.0 - 0.2 %   Neutrophils Relative % 26 %   Neutro Abs 1.1 (L) 1.7 - 7.7 K/uL   Lymphocytes Relative 62 %   Lymphs Abs 2.8 0.7 - 4.0 K/uL   Monocytes Relative 5 %   Monocytes Absolute 0.2 0.1 - 1.0 K/uL   Eosinophils Relative 1 %   Eosinophils Absolute 0.0 0.0 - 0.5 K/uL   Basophils Relative 1 %   Basophils Absolute 0.0 0.0 - 0.1 K/uL   WBC Morphology See Note     Comment: Mild Left Shift (1-5% metas, occ myelo)   RBC Morphology MORPHOLOGY UNREMARKABLE    Smear Review Normal platelet morphology    Immature Granulocytes 5 %   Abs Immature Granulocytes 0.21 (H) 0.00 - 0.07 K/uL    Comment: Performed at [facility], [address] [place], [place] [address]    PHQ2/9:    05/03/2024   10:38 AM 04/22/2024    2:20 PM 03/11/2024   11:02 AM 03/03/2024   12:59 PM 02/06/2024   11:29 AM  Depression screen PHQ 2/9  Decreased Interest 0 0 0 0 0  Down, Depressed, Hopeless 0 0 0 0 0  PHQ - 2 Score 0 0 0 0 0  Altered sleeping 0 0 0 0 0  Tired, decreased energy 0 0 0 0 0  Change in appetite 0 0 0 0 0  Feeling bad or failure about yourself  0 0 0 0 0  Trouble concentrating 0 0 0 0 0  Moving slowly or fidgety/restless 0 0 0 0 0  Suicidal thoughts 0 0 0 0 0  PHQ-9 Score 0 0 0 0 0  Difficult doing work/chores Not difficult at all   Not difficult at all Not difficult at all    phq 9 is negative  Fall Risk:    05/03/2024   10:38 AM 03/03/2024   12:59 PM 02/06/2024   11:29 AM 01/29/2024    9:49 AM 12/18/2023    2:40 PM  Fall Risk   Falls in the past year? 0 0 0 0 0  Number falls in past yr: 0 0 0 0 0  Injury with Fall? 0 0 0 0 0  Risk for fall due to : No Fall Risks No Fall Risks No Fall Risks No Fall Risks No Fall Risks  Follow up Falls  evaluation completed Education provided;Falls evaluation completed;Falls prevention discussed Falls prevention discussed;Education provided;Falls evaluation completed Falls prevention discussed;Education provided;Falls evaluation completed Falls prevention discussed;Falls evaluation completed      Assessment & Plan Osteopenia with high  fracture risk Osteopenia with very high fracture risk. Alendronate  suitable but monitor for GERD  - Prescribe alendronate  90-day supply. - Discuss potential infusion options with Dr. Jacobo.  Chronic lymphocytic leukemia under surveillance with anemia and thrombocytopenia Chronic lymphocytic leukemia under surveillance. Improved white count, decreased hemoglobin, persistent thrombocytopenia. Awaiting PET scan results. - Await PET scan results. - Follow up with Dr. Jacobo on August 25 for further management.  Chronic kidney disease stage 3A Chronic kidney disease stage 3A with fluctuating kidney function. Focus on blood pressure control and hydration. - Monitor kidney function. - Consider SGLT2 inhibitors if kidney function consistently drops below 50.  Peripheral arterial disease with aortic atherosclerosis Peripheral arterial disease with aortic atherosclerosis, managed with atorvastatin . - Continue atorvastatin  10 mg daily.  Emphysema Emphysema managed with albuterol  and Anoro Ellipta . Insurance issues with Trelegy. Discussed inhaler side effects and mouth rinsing. - Continue current inhalers. 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 - Continue pregabalin  and nortriptyline .  Chronic low back pain with sciatica Chronic low back pain with sciatica, no recent exacerbations. - Continue current management.  Allergic rhinitis Allergic rhinitis with dry cough  and nasal drip. Discussed Xyzal  and Mucinex. Prefers not to use nasal sprays. - Prescribe Xyzal . - Recommend Mucinex for cough.  Major depressive disorder, in remission Major depressive disorder in remission, managed with Abilify . - Continue Abilify  5 mg daily.

## 2024-05-04 ENCOUNTER — Other Ambulatory Visit: Payer: Self-pay

## 2024-05-04 ENCOUNTER — Ambulatory Visit
Admission: RE | Admit: 2024-05-04 | Discharge: 2024-05-04 | Disposition: A | Discharge status: Home / Self Care | Source: Ambulatory Visit | Attending: Oncology | Admitting: Oncology

## 2024-05-04 ENCOUNTER — Other Ambulatory Visit (HOSPITAL_COMMUNITY): Payer: Self-pay

## 2024-05-04 DIAGNOSIS — J90 Pleural effusion, not elsewhere classified: Secondary | ICD-10-CM | POA: Insufficient documentation

## 2024-05-04 DIAGNOSIS — J9811 Atelectasis: Secondary | ICD-10-CM | POA: Insufficient documentation

## 2024-05-04 DIAGNOSIS — C9110 Chronic lymphocytic leukemia of B-cell type not having achieved remission: Secondary | ICD-10-CM | POA: Insufficient documentation

## 2024-05-04 DIAGNOSIS — I700 Atherosclerosis of aorta: Secondary | ICD-10-CM | POA: Diagnosis not present

## 2024-05-04 LAB — GLUCOSE, CAPILLARY: Glucose-Capillary: 97 mg/dL (ref 70–99)

## 2024-05-04 MED ORDER — FLUDEOXYGLUCOSE F - 18 (FDG) INJECTION
9.2800 | Freq: Once | INTRAVENOUS | Status: AC | PRN
  Administered 2024-05-04: 9.28 via INTRAVENOUS

## 2024-05-05 ENCOUNTER — Other Ambulatory Visit: Payer: Self-pay

## 2024-05-05 ENCOUNTER — Other Ambulatory Visit (HOSPITAL_COMMUNITY): Payer: Self-pay

## 2024-05-05 ENCOUNTER — Encounter: Payer: Self-pay | Admitting: Oncology

## 2024-05-05 LAB — HAPTOGLOBIN: Haptoglobin: <10 mg/dL — ABNORMAL LOW (ref 33–346)

## 2024-05-06 LAB — INTELLIGEN MYELOID

## 2024-05-07 ENCOUNTER — Other Ambulatory Visit: Payer: Self-pay | Admitting: *Deleted

## 2024-05-07 ENCOUNTER — Encounter (HOSPITAL_COMMUNITY): Payer: Self-pay | Admitting: Oncology

## 2024-05-07 DIAGNOSIS — C9110 Chronic lymphocytic leukemia of B-cell type not having achieved remission: Secondary | ICD-10-CM

## 2024-05-10 ENCOUNTER — Ambulatory Visit: Admitting: Oncology

## 2024-05-10 ENCOUNTER — Encounter: Payer: Self-pay | Admitting: Oncology

## 2024-05-10 ENCOUNTER — Inpatient Hospital Stay (HOSPITAL_BASED_OUTPATIENT_CLINIC_OR_DEPARTMENT_OTHER): Admitting: Oncology

## 2024-05-10 ENCOUNTER — Other Ambulatory Visit: Payer: Self-pay | Admitting: *Deleted

## 2024-05-10 ENCOUNTER — Inpatient Hospital Stay

## 2024-05-10 ENCOUNTER — Other Ambulatory Visit: Payer: Self-pay | Admitting: Family Medicine

## 2024-05-10 ENCOUNTER — Other Ambulatory Visit

## 2024-05-10 ENCOUNTER — Other Ambulatory Visit: Payer: Self-pay

## 2024-05-10 ENCOUNTER — Other Ambulatory Visit (HOSPITAL_COMMUNITY): Payer: Self-pay

## 2024-05-10 VITALS — BP 101/68 | HR 68 | Temp 97.0°F | Resp 18 | Ht 67.0 in | Wt 170.0 lb

## 2024-05-10 DIAGNOSIS — J90 Pleural effusion, not elsewhere classified: Secondary | ICD-10-CM

## 2024-05-10 DIAGNOSIS — C9110 Chronic lymphocytic leukemia of B-cell type not having achieved remission: Secondary | ICD-10-CM

## 2024-05-10 LAB — CMP (CANCER CENTER ONLY)
ALT: 15 U/L (ref 0–44)
AST: 27 U/L (ref 15–41)
Albumin: 3.6 g/dL (ref 3.5–5.0)
Alkaline Phosphatase: 117 U/L (ref 38–126)
Anion gap: 10 (ref 5–15)
BUN: 22 mg/dL — ABNORMAL HIGH (ref 6–20)
CO2: 24 mmol/L (ref 22–32)
Calcium: 9.4 mg/dL (ref 8.9–10.3)
Chloride: 101 mmol/L (ref 98–111)
Creatinine: 1.17 mg/dL — ABNORMAL HIGH (ref 0.44–1.00)
GFR, Estimated: 55 mL/min — ABNORMAL LOW (ref >60)
Glucose, Bld: 107 mg/dL — ABNORMAL HIGH (ref 70–99)
Potassium: 3.8 mmol/L (ref 3.5–5.1)
Sodium: 135 mmol/L (ref 135–145)
Total Bilirubin: 1.2 mg/dL (ref 0.0–1.2)
Total Protein: 6 g/dL — ABNORMAL LOW (ref 6.5–8.1)

## 2024-05-10 LAB — CBC WITH DIFFERENTIAL/PLATELET
Abs Immature Granulocytes: 0.14 K/uL — ABNORMAL HIGH (ref 0.00–0.07)
Basophils Absolute: 0 K/uL (ref 0.0–0.1)
Basophils Relative: 1 %
Eosinophils Absolute: 0.1 K/uL (ref 0.0–0.5)
Eosinophils Relative: 1 %
HCT: 26.7 % — ABNORMAL LOW (ref 36.0–46.0)
Hemoglobin: 9.2 g/dL — ABNORMAL LOW (ref 12.0–15.0)
Immature Granulocytes: 3 %
Lymphocytes Relative: 66 %
Lymphs Abs: 3.5 K/uL (ref 0.7–4.0)
MCH: 35.5 pg — ABNORMAL HIGH (ref 26.0–34.0)
MCHC: 34.5 g/dL (ref 30.0–36.0)
MCV: 103.1 fL — ABNORMAL HIGH (ref 80.0–100.0)
Monocytes Absolute: 0.2 K/uL (ref 0.1–1.0)
Monocytes Relative: 5 %
Neutro Abs: 1.2 K/uL — ABNORMAL LOW (ref 1.7–7.7)
Neutrophils Relative %: 24 %
Platelets: 46 K/uL — ABNORMAL LOW (ref 150–400)
RBC: 2.59 MIL/uL — ABNORMAL LOW (ref 3.87–5.11)
RDW: 20.1 % — ABNORMAL HIGH (ref 11.5–15.5)
WBC: 5.2 K/uL (ref 4.0–10.5)
nRBC: 0 % (ref 0.0–0.2)

## 2024-05-10 LAB — LACTATE DEHYDROGENASE: LDH: 343 U/L — ABNORMAL HIGH (ref 98–192)

## 2024-05-10 LAB — SAMPLE TO BLOOD BANK

## 2024-05-10 MED ORDER — OMEPRAZOLE 20 MG PO CPDR
20.0000 mg | DELAYED_RELEASE_CAPSULE | Freq: Every morning | ORAL | 1 refills | Status: DC
  Filled 2024-05-10 – 2024-05-19 (×3): qty 30, 30d supply, fill #0
  Filled 2024-06-17 – 2024-06-23 (×2): qty 30, 30d supply, fill #1

## 2024-05-10 NOTE — Progress Notes (Signed)
 Patient has a couple of questions for the provider today.

## 2024-05-10 NOTE — Progress Notes (Addendum)
 [facility]  [phone] [phone]  ID: Monique [name] OB: 05-Oct-1970  MR#: [id number]  RDW#:[id number]  Patient Care Team: Sowles, Krichna, MD as PCP - General (Family Medicine) Jacobo Evalene PARAS, MD as Consulting Physician (Oncology) [name], [facility] (Inactive) as Pharmacist (Pharmacist) [name], RN as Registered Nurse (Oncology) Borders, Fonda SAUNDERS, NP as Nurse Practitioner (Hospice and Palliative Medicine) Lenn Aran, MD as Consulting Physician (Radiation Oncology) Pa, [facility]  CHIEF COMPLAINT: CLL.  INTERVAL HISTORY: Patient returns to clinic today for further evaluation and discussion of her bone marrow biopsy results.  She has increased cough and shortness of breath.  She continues to have increased weakness and fatigue.  She otherwise feels well.  She denies any recent fevers or illnesses.  She continues to have a chronic peripheral neuropathy and hand cramping. She has no other neurologic complaints.  She has a good appetite and her weight has remained stable.  She has no chest pain, shortness of breath, cough, or hemoptysis.  She denies any nausea, vomiting, constipation, or diarrhea.  She has no melena or hematochezia.  She has no urinary complaints.  Patient offers no further specific complaints today.  REVIEW OF SYSTEMS:   Review of Systems  Constitutional: Negative.  Negative for diaphoresis, fever, malaise/fatigue and weight loss.  HENT:  Negative for congestion.   Respiratory:  Positive for cough and shortness of breath. Negative for hemoptysis.   Cardiovascular: Negative.  Negative for chest pain and leg swelling.  Gastrointestinal: Negative.  Negative for abdominal pain, constipation and diarrhea.  Genitourinary: Negative.  Negative for dysuria and frequency.  Musculoskeletal: Negative.  Negative for back pain, myalgias and neck pain.  Skin: Negative.  Negative for rash.  Neurological:   Positive for tingling and sensory change. Negative for focal weakness and weakness.  Endo/Heme/Allergies:  Bruises/bleeds easily.  Psychiatric/Behavioral: Negative.  Negative for depression and suicidal ideas. The patient is not nervous/anxious.     As per HPI. Otherwise, a complete review of systems is negative.  PAST MEDICAL HISTORY: Past Medical History:  Diagnosis Date   Anxiety    Bursitis    leg pain   Carpal tunnel syndrome    Cervical dysplasia    hx LEEP over 18 years ago.    Chronic lymphocytic leukemia (HCC) 2018   Dr Jacobo.  (in lymph nodes)   COPD (chronic obstructive pulmonary disease) (HCC)    COVID-19 2021   Depression    Eating disorder    GERD (gastroesophageal reflux disease)    History of self-harm    Insomnia    Obsession    Tobacco abuse    Vitamin B12 deficiency (non anemic)     PAST SURGICAL HISTORY: Past Surgical History:  Procedure Laterality Date   BREAST BIOPSY Right 01/05/2018   US  guided biopsy of 2 areas and 1 lymph node, MIXED INFLAMMATION AND GIANT CELL REACTION   CERVICAL BIOPSY  W/ LOOP ELECTRODE EXCISION     COLONOSCOPY WITH PROPOFOL  N/A 04/21/2019   Procedure: COLONOSCOPY WITH PROPOFOL ;  Surgeon: Janalyn Keene NOVAK, MD;  Location: [facility] ENDOSCOPY;  Service: Gastroenterology;  Laterality: N/A;   IR BONE MARROW BIOPSY & ASPIRATION  04/26/2024   OTHER SURGICAL HISTORY     scar tissue removed from vocal cords   TUBAL LIGATION     VAGINAL HYSTERECTOMY N/A 01/22/2021   Procedure: HYSTERECTOMY VAGINAL; BILATERAL SALPINGECTOMY;  Surgeon: Connell Davies, MD;  Location: [facility] ORS;  Service: Gynecology;  Laterality: N/A;  vocal cord surgery  2005    FAMILY HISTORY: Family History  Problem Relation Age of Onset   Depression Mother    Cancer Mother        thyroid   Alcohol abuse Father    COPD Father    Alcohol abuse Brother    Depression Brother    Bipolar disorder Brother    Suicidality Brother    ADD / ADHD Son    Breast cancer Neg  Hx     ADVANCED DIRECTIVES (Y/N):  N  HEALTH MAINTENANCE: Social History   Tobacco Use   Smoking status: Every Day    Current packs/day: 1.50    Types: Cigarettes   Smokeless tobacco: Never  Vaping Use   Vaping status: Former  Substance Use Topics   Alcohol use: Not Currently    Alcohol/week: 0.0 standard drinks of alcohol    Comment: rarely   Drug use: Yes    Frequency: 7.0 times per week    Types: Marijuana     Colonoscopy:  PAP:  Bone density:  Lipid panel:  Allergies  Allergen Reactions   Doxycycline  Rash    Rash on cheeks and sides of nose    Duloxetine  Rash    Current Outpatient Medications  Medication Sig Dispense Refill   albuterol  (VENTOLIN  HFA) 108 (90 Base) MCG/ACT inhaler Inhale 2 puffs into the lungs every 6 (six) hours as needed for wheezing or shortness of breath. 8 g 0   alendronate  (FOSAMAX ) 70 MG tablet Take 1 tablet (70 mg total) by mouth every 7 (seven) days. Take with a full glass of water on an empty stomach. 12 tablet 3   ARIPiprazole  (ABILIFY ) 5 MG tablet Take 1 tablet (5 mg total) by mouth daily. 90 tablet 0   ASPIRIN  81 PO Take 81 mg by mouth daily.     atorvastatin  (LIPITOR) 10 MG tablet Take 1 tablet (10 mg total) by mouth daily. 100 tablet 3   benzonatate  (TESSALON ) 100 MG capsule Take 1-2 capsules (100-200 mg total) by mouth 3 (three) times daily as needed for cough. 40 capsule 0   Ca Phosphate-Cholecalciferol (CALTRATE GUMMY BITES) 250-10 MG-MCG CHEW Chew 1 gummy by mouth 2 (two) times daily. 180 tablet 3   estradiol  (ESTRACE ) 0.5 MG tablet Take 1 tablet (0.5 mg total) by mouth daily. 90 tablet 3   famotidine  (PEPCID ) 10 MG tablet Take by mouth.     levocetirizine (XYZAL ) 5 MG tablet Take 1 tablet (5 mg total) by mouth every evening. 90 tablet 1   lidocaine  (LIDODERM ) 5 % Place 1 patch onto the skin daily. Remove & Discard patch within 12 hours or as directed by MD 30 patch 0   MAGNESIUM  PO Take by mouth.     nortriptyline  (PAMELOR ) 10  MG capsule Take 2 capsules (20 mg total) by mouth daily. 180 capsule 1   omeprazole  (PRILOSEC) 20 MG capsule Take 1 capsule (20 mg total) by mouth in the morning. 30 capsule 1   pregabalin  (LYRICA ) 150 MG capsule Take 1 capsule (150 mg total) by mouth 3 (three) times daily. 90 capsule 1   tiZANidine  (ZANAFLEX ) 2 MG tablet Take 1 tablet (2 mg total) by mouth at bedtime. 90 tablet 0   umeclidinium-vilanterol (ANORO ELLIPTA ) 62.5-25 MCG/ACT AEPB Inhale 1 puff into the lungs daily. 180 each 1   vitamin B-12 (CYANOCOBALAMIN ) 500 MCG tablet Take 250 mcg by mouth every morning.     VITAMIN D  PO Take by mouth.  lenalidomide  (REVLIMID ) 10 MG capsule TAKE 1 CAPSULE BY MOUTH EVERY DAY (Patient not taking: Reported on 05/10/2024) 28 capsule 0   No current facility-administered medications for this visit.    OBJECTIVE: Vitals:   05/10/24 1010  BP: 101/68  Pulse: 68  Resp: 18  Temp: (!) 97 F (36.1 C)  SpO2: 96%       Body mass index is 26.63 kg/m.    ECOG FS:1 - Symptomatic but completely ambulatory  General: Well-developed, well-nourished, no acute distress. Eyes: Pink conjunctiva, anicteric sclera. HEENT: Normocephalic, moist mucous membranes. Lungs: No audible wheezing or coughing. Heart: Regular rate and rhythm. Abdomen: Soft, nontender, no obvious distention. Musculoskeletal: No edema, cyanosis, or clubbing. Neuro: Alert, answering all questions appropriately. Cranial nerves grossly intact. Skin: No rashes or petechiae noted. Psych: Normal affect.  LAB RESULTS:  Lab Results  Component Value Date   NA 135 05/10/2024   K 3.8 05/10/2024   CL 101 05/10/2024   CO2 24 05/10/2024   GLUCOSE 107 (H) 05/10/2024   BUN 22 (H) 05/10/2024   CREATININE 1.17 (H) 05/10/2024   CALCIUM  9.4 05/10/2024   PROT 6.0 (L) 05/10/2024   ALBUMIN 3.6 05/10/2024   AST 27 05/10/2024   ALT 15 05/10/2024   ALKPHOS 117 05/10/2024   BILITOT 1.2 05/10/2024   GFRNONAA 55 (L) 05/10/2024   GFRAA 79  12/06/2020    Lab Results  Component Value Date   WBC 5.2 05/10/2024   NEUTROABS 1.2 (L) 05/10/2024   HGB 9.2 (L) 05/10/2024   HCT 26.7 (L) 05/10/2024   MCV 103.1 (H) 05/10/2024   PLT 46 (L) 05/10/2024     STUDIES: NM PET Image Restag (PS) Skull Base To Thigh Result Date: 05/10/2024 CLINICAL DATA:  Subsequent treatment strategy for chronic lymphocytic leukemia. EXAM: NUCLEAR MEDICINE PET SKULL BASE TO THIGH TECHNIQUE: 9.3 mCi F-18 FDG was injected intravenously. Full-ring PET imaging was performed from the skull base to thigh after the radiotracer. CT data was obtained and used for attenuation correction and anatomic localization. Fasting blood glucose: 97 mg/dl COMPARISON:  94/79/7974. FINDINGS: Mediastinal blood pool activity: SUV max 2.1 Liver activity: SUV max NA NECK: No abnormal hypermetabolism. Incidental CT findings: None. CHEST: Mildly hypermetabolic mediastinal, internal mammary and axillary lymph nodes bilaterally. Index left axillary lymph node measures 2.3 cm, SUV max 2.9, stable. Increased hypermetabolic pleural thickening in the lower right hemithorax, SUV max 5.5, medially, previously 4.6. Incidental CT findings: Atherosclerotic calcification of the aorta. Heart is at the upper limits of normal in size to mildly enlarged. No pericardial pleural effusion. Large right pleural effusion with compressive atelectasis in the right lower lobe. ABDOMEN/PELVIS: Persistent matted hypermetabolic gastrohepatic ligament, periportal and retroperitoneal adenopathy, minimally improved. Index left periaortic lymph node, now measuring 3.2 cm (4/114), SUV max 5.2, previously 3.5 cm and SUV max 6.6. Additional scattered minimally hypermetabolic lymph nodes in the sigmoid mesocolon and adjacent to the rectum. Index right perirectal lymph node has enlarged slightly, now measuring 13 mm (4/150), SUV max 2.9, previously 10 mm and SUV max 2.2. Spleen is not hypermetabolic. Incidental CT findings: Similar left  adrenal thickening. Spleen appears mildly enlarged. Atherosclerotic calcification of the aorta. Scarring in the kidneys. SKELETON: Diffuse osseous hypermetabolism, as before and treatment related. Incidental CT findings: Degenerative changes in the spine. IMPRESSION: 1. Mixed response to therapy as evidenced by increasing pleural thickening and hypermetabolism in the right hemithorax as well as an enlarging hypermetabolic right perirectal lymph node. Mild improvement in abdominal retroperitoneal adenopathy. Additional hypermetabolic  adenopathy throughout the chest, abdomen and pelvis is grossly stable. 2. Large right pleural effusion with compressive atelectasis in the right lower lobe. 3.  Aortic atherosclerosis (ICD10-I70.0). Electronically Signed   By: Newell Eke M.D.   On: 05/10/2024 10:22   IR BONE MARROW BIOPSY & ASPIRATION Result Date: 04/26/2024 INDICATION: Persistent chronic thrombocytopenia in the setting of known CLL. Bone marrow biopsy for restaging. EXAM: Fluoroscopic GUIDED BONE MARROW ASPIRATION AND CORE BIOPSY Interventional Radiologist:  Wilkie LOIS Lent, MD MEDICATIONS: None. ANESTHESIA/SEDATION: Moderate (conscious) sedation was employed during this procedure. A total of 2 milligrams versed  and 100 micrograms fentanyl  were administered intravenously by the Radiology nurse. The patient's level of consciousness and vital signs were monitored continuously by radiology nursing throughout the procedure under my direct supervision. Total monitored sedation time: 10 minutes FLUOROSCOPY: Radiation exposure index: 3 mGy, reference air kerma COMPLICATIONS: None immediate. Estimated blood loss: <25 mL PROCEDURE: Informed written consent was obtained from the patient after a thorough discussion of the procedural risks, benefits and alternatives. All questions were addressed. Maximal Sterile Barrier Technique was utilized including caps, mask, sterile gowns, sterile gloves, sterile drape, hand  hygiene and skin antiseptic. A timeout was performed prior to the initiation of the procedure. The patient was positioned prone and fluoroscopic imaging was performed of the pelvis to demonstrate the iliac marrow spaces. Maximal barrier sterile technique utilized including caps, mask, sterile gowns, sterile gloves, large sterile drape, hand hygiene, and betadine  prep. Under sterile conditions and local anesthesia, an 11 gauge coaxial bone biopsy needle was advanced into the right iliac marrow space. Needle position was confirmed with imaging. Initially, bone marrow aspiration was performed. Next, the 11 gauge outer cannula was utilized to obtain a right iliac bone marrow core biopsy. Needle was removed. Hemostasis was obtained with compression. The patient tolerated the procedure well. Samples were prepared with the cytotechnologist. IMPRESSION: Technically successful image guided bone marrow aspiration and core biopsy of the right iliac bone. Electronically Signed   By: Wilkie Lent M.D.   On: 04/26/2024 09:58      ONCOLOGY HISTORY:  Patient completed cycle 3 of Rituxan  plus Treanda  on March 26, 2018, but was then noted to have progressive disease.  She was then initiated on 480 mg Imbruvica  in November 2019, but had progression of disease with increasing white blood cell count as well as CT scan on Jan 22, 2022 revealing increased lymphadenopathy consistent with progression of disease.  Attempted patient on venetoclax , but she could not tolerate it secondary to persistent diarrhea and transaminitis.  Patient then completed 4 weekly cycles of Rituxan  then was transition to treatment every 4 weeks.  She last received Rituxan  on May 07, 2023.  She also recently completed XRT to her left axilla.  Since that time, patient has been on Revlimid  intermittently.  ASSESSMENT: CLL.  PLAN:    CLL: Bone marrow biopsy on April 26, 2024 revealed a hypercellular marrow with 95% of the cellularity comprised of  CLL.  She had highly complex cytogenetics including loss of 17p.  PET scan results from May 04, 2024 revealed a mixed response to therapy.  Given patient's relative resistance to recent therapy, Revlimid  has been discontinued and patient was given a referral to [facility] for possible consideration of CAR-T therapy.  Pirtobrutinib is also an option.  Appreciate clinical pharmacy input.  Follow-up will be based on [facility]'s evaluation. Neutropenia: ANC mildly improved to 1.2. Thrombocytopenia: Chronic and unchanged.  Patient's platelet count is 46.   Anemia: Hemoglobin  trended down slightly to 9.2.  She does not require transfusion today.  All blood products should be irradiated. Trigeminal neuralgia: Resolved.  MRI of the brain on Jan 25, 2019 was unremarkable.   Depression/anxiety: Chronic and unchanged.  Continue follow-up and treatment per primary care. Neuropathic pain/peripheral neuropathy: Chronic and unchanged.  She reports she is now on disability.  Continue current follow-up and treatment as per neurology. Rituxan  reaction: Patient will require additional premedications for preventative measures if Rituxan  is reinitiated in the future. Renal insufficiency: Her most recent creatinine is improved to 1.17.   Hand cramping: Patient does not complain of this today. Cough/shortness of breath: Patient has agreed to ultrasound-guided thoracentesis.  Patient expressed understanding and was in agreement with this plan. She also understands that She can call clinic at any time with any questions, concerns, or complaints.    Evalene JINNY Reusing, MD   05/10/2024 12:53 PM

## 2024-05-11 ENCOUNTER — Ambulatory Visit
Admission: RE | Admit: 2024-05-11 | Discharge: 2024-05-11 | Disposition: A | Discharge status: Home / Self Care | Source: Ambulatory Visit | Attending: Oncology | Admitting: Oncology

## 2024-05-11 ENCOUNTER — Encounter: Payer: Self-pay | Admitting: Oncology

## 2024-05-11 ENCOUNTER — Other Ambulatory Visit: Payer: Self-pay

## 2024-05-11 ENCOUNTER — Other Ambulatory Visit: Payer: Self-pay | Admitting: Family Medicine

## 2024-05-11 ENCOUNTER — Other Ambulatory Visit (HOSPITAL_COMMUNITY): Payer: Self-pay

## 2024-05-11 ENCOUNTER — Other Ambulatory Visit: Payer: Self-pay | Admitting: Oncology

## 2024-05-11 DIAGNOSIS — C9110 Chronic lymphocytic leukemia of B-cell type not having achieved remission: Secondary | ICD-10-CM | POA: Diagnosis not present

## 2024-05-11 DIAGNOSIS — J90 Pleural effusion, not elsewhere classified: Secondary | ICD-10-CM | POA: Insufficient documentation

## 2024-05-11 DIAGNOSIS — Z9889 Other specified postprocedural states: Secondary | ICD-10-CM

## 2024-05-11 DIAGNOSIS — Z48813 Encounter for surgical aftercare following surgery on the respiratory system: Secondary | ICD-10-CM | POA: Diagnosis not present

## 2024-05-11 MED ORDER — UMECLIDINIUM-VILANTEROL 62.5-25 MCG/ACT IN AEPB
1.0000 | INHALATION_SPRAY | Freq: Every day | RESPIRATORY_TRACT | 1 refills | Status: AC
  Filled 2024-05-11: qty 180, 90d supply, fill #0
  Filled 2024-08-02: qty 180, 90d supply, fill #1

## 2024-05-11 MED ORDER — LIDOCAINE HCL (PF) 1 % IJ SOLN
10.0000 mL | Freq: Once | INTRAMUSCULAR | Status: AC
  Administered 2024-05-11: 10 mL
  Filled 2024-05-11: qty 10

## 2024-05-11 NOTE — Procedures (Signed)
 PROCEDURE SUMMARY:  Successful image-guided right-sided diagnostic and therapeutic thoracentesis. Yielded 1.5 L liters of clear, blood-tinged pleural fluid. Patient tolerated procedure well. EBL: Zero No immediate complications.  Specimen was sent for labs. Post procedure CXR shows no pneumothorax.  Please see imaging section of Epic for full dictation.  [facility] LABOR Sharline Lehane PA-C 05/11/2024 12:01 PM

## 2024-05-12 ENCOUNTER — Other Ambulatory Visit

## 2024-05-12 ENCOUNTER — Other Ambulatory Visit: Payer: Self-pay

## 2024-05-12 ENCOUNTER — Ambulatory Visit: Admitting: Oncology

## 2024-05-13 LAB — COMP PANEL: LEUKEMIA/LYMPHOMA: Immunophenotypic Profile: 94

## 2024-05-14 LAB — CYTOLOGY - NON PAP

## 2024-05-18 ENCOUNTER — Other Ambulatory Visit: Payer: Self-pay

## 2024-05-18 ENCOUNTER — Other Ambulatory Visit (HOSPITAL_COMMUNITY): Payer: Self-pay

## 2024-05-18 ENCOUNTER — Telehealth: Payer: Self-pay | Admitting: *Deleted

## 2024-05-18 NOTE — Telephone Encounter (Signed)
 Call placed to [facility] bone marrow transplant/cellular therapy to follow up on referral sent on 8/25. Left vm for referral coordinator.

## 2024-05-19 ENCOUNTER — Encounter: Payer: Self-pay | Admitting: Family Medicine

## 2024-05-19 ENCOUNTER — Telehealth: Payer: Self-pay | Admitting: *Deleted

## 2024-05-19 ENCOUNTER — Ambulatory Visit (INDEPENDENT_AMBULATORY_CARE_PROVIDER_SITE_OTHER): Admitting: Family Medicine

## 2024-05-19 ENCOUNTER — Other Ambulatory Visit: Payer: Self-pay

## 2024-05-19 VITALS — BP 126/74 | HR 107 | Resp 16 | Ht 67.0 in | Wt 165.8 lb

## 2024-05-19 DIAGNOSIS — M7918 Myalgia, other site: Secondary | ICD-10-CM | POA: Diagnosis not present

## 2024-05-19 LAB — CK: Total CK: 18 U/L — ABNORMAL LOW (ref 21–240)

## 2024-05-19 MED ORDER — LIDOCAINE-PRILOCAINE 2.5-2.5 % EX CREA: 1.0000 | TOPICAL_CREAM | CUTANEOUS | 0 refills | Status: DC | PRN

## 2024-05-19 NOTE — Progress Notes (Signed)
 Name: Monique [name]   MRN: [id number]    DOB: 03-Feb-1971   Date:05/19/2024       Progress Note  Subjective  Chief Complaint  Chief Complaint  Patient presents with   Pain    X1 week, Bilateral upper thigh when walking radiates down to knees, earlier today on R calf received a sharp pain   Discussed the use of AI scribe software for clinical note transcription with the patient, who gave verbal consent to proceed.  History of Present Illness Monique [name] is a 53 year old female with chronic low back pain and chronic lymphocytic leukemia who presents with new onset anterior thigh pain and weakness.  She experiences new onset pain radiating from the anterior thigh down to the knee, predominantly affecting the left leg. There is difficulty lifting the leg, especially when putting on underwear, and the pain worsens with walking but is absent when sitting. No numbness, bowel or bladder incontinence, or numbness in the private area. The pain began suddenly and is described as a pulling sensation when standing or walking.  She has a history of chronic low back pain and osteopenia. She is not currently attending a pain clinic. Her current medications include Lyrica  (pregabalin ), tizanidine , and nortriptyline . Tramadol  was ineffective for her leg pain. She lacks access to Lidoderm  patches due to insurance issues and has not tried lidocaine  cream. A pain cream from her brother was also ineffective but she is not sure of the name  She has chronic lymphocytic leukemia and recently underwent a PET scan showing diffuse osseous hypermetabolism. She has a history of right-sided pleural effusion, treated with thoracentesis in August, which provided significant relief in breathing. The fluid was blood-tinged and sent for analysis to rule out cancer. She is awaiting further consultation at [facility] as her current hematologist has exhausted treatment options.  She has been taking atorvastatin  for a long  time without any recent changes in her medication regimen.    Patient Active Problem List   Diagnosis Date Noted   Stage 3a chronic kidney disease (HCC) 10/02/2023   Senile purpura (HCC) 10/02/2023   Major depression in remission (HCC) 10/02/2023   Pancytopenia (HCC) 10/02/2023   Post-menopause on HRT (hormone replacement therapy) 11/08/2022   At risk for sleep apnea 09/19/2021   Lumbar foraminal stenosis (L4-5) (Bilateral) (R>L) 06/05/2021   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 06/05/2021   Lumbosacral radiculopathy/radiculitis at L5 (Bilateral) 06/05/2021   PAD (peripheral artery disease) (HCC) 02/14/2021   CIN II (cervical intraepithelial neoplasia II) 02/01/2021   S/P vaginal hysterectomy 01/22/2021   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/09/2020   Bilateral leg pain 11/03/2019   Iron  deficiency anemia 10/15/2019   Bilateral arm pain 08/05/2019   Bilateral hand numbness 08/05/2019   Weakness of both hands 08/05/2019   Chronic musculoskeletal pain 07/19/2019   Chronic neuropathic pain 07/19/2019   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   Special screening for malignant neoplasms, colon    Polyp of sigmoid colon    Hemorrhoids    Trigeminal neuralgia 10/05/2018   Lumbar radiculitis (Right) 09/24/2018   Hypocalcemia 06/03/2018   Vitamin D  insufficiency 06/03/2018   Abnormal MRI, lumbar spine (06/02/2020) 06/03/2018   Chronic hip pain (Right) 06/03/2018   Spondylosis without myelopathy or radiculopathy, lumbar region 06/03/2018   DDD (degenerative disc disease), lumbar 06/02/2018   Lumbar facet hypertrophy 06/02/2018   Lumbar facet arthropathy 06/02/2018   Lumbar facet syndrome (Bilateral) (R>L) 06/02/2018   Marijuana use  05/19/2018   Chronic lower extremity pain (1ry area of Pain) (Bilateral) (R>L) 05/13/2018   Chronic low back pain (2ry area of Pain) (Bilateral) (R>L) w/ sciatica (Bilateral) 05/13/2018   Chronic pain syndrome 05/13/2018    Disorder of skeletal system 05/13/2018   Pharmacologic therapy 05/13/2018   Problems influencing health status 05/13/2018   Mastalgia 05/05/2018   Breast mass, right 01/07/2018   Face pain 12/11/2017   Tingling 12/11/2017   Thoracic aortic atherosclerosis (HCC) 08/20/2017   Emphysema of lung (HCC) 08/20/2017   Adductor tendinitis 04/23/2017   Trochanteric bursitis of right hip 04/23/2017   GERD without esophagitis 12/11/2016   CLL (chronic lymphocytic leukemia) (HCC) 11/24/2016   Stress incontinence 09/04/2016   B12 deficiency 07/10/2015   Insomnia 07/10/2015   Anorexia nervosa, restricting type 07/10/2015   Anxiety, generalized 07/10/2015   H/O suicide attempt 07/10/2015   Lymphocytosis 07/10/2015   Dysplasia of cervix, low grade (CIN 1) 07/10/2015   Obsessive-compulsive disorder 07/10/2015   Tobacco use 07/10/2015   History of cervical dysplasia 07/18/2014    Past Surgical History:  Procedure Laterality Date   BREAST BIOPSY Right 01/05/2018   US  guided biopsy of 2 areas and 1 lymph node, MIXED INFLAMMATION AND GIANT CELL REACTION   CERVICAL BIOPSY  W/ LOOP ELECTRODE EXCISION     COLONOSCOPY WITH PROPOFOL  N/A 04/21/2019   Procedure: COLONOSCOPY WITH PROPOFOL ;  Surgeon: Janalyn Keene NOVAK, MD;  Location: [facility] ENDOSCOPY;  Service: Gastroenterology;  Laterality: N/A;   IR BONE MARROW BIOPSY & ASPIRATION  04/26/2024   OTHER SURGICAL HISTORY     scar tissue removed from vocal cords   TUBAL LIGATION     VAGINAL HYSTERECTOMY N/A 01/22/2021   Procedure: HYSTERECTOMY VAGINAL; BILATERAL SALPINGECTOMY;  Surgeon: Connell Davies, MD;  Location: [facility] ORS;  Service: Gynecology;  Laterality: N/A;   vocal cord surgery  2005    Family History  Problem Relation Age of Onset   Depression Mother    Cancer Mother        thyroid   Alcohol abuse Father    COPD Father    Alcohol abuse Brother    Depression Brother    Bipolar disorder Brother    Suicidality Brother    ADD / ADHD Son     Breast cancer Neg Hx     Social History   Tobacco Use   Smoking status: Every Day    Current packs/day: 1.50    Types: Cigarettes   Smokeless tobacco: Never  Substance Use Topics   Alcohol use: Not Currently    Alcohol/week: 0.0 standard drinks of alcohol    Comment: rarely     Current Outpatient Medications:    albuterol  (VENTOLIN  HFA) 108 (90 Base) MCG/ACT inhaler, Inhale 2 puffs into the lungs every 6 (six) hours as needed for wheezing or shortness of breath., Disp: 8 g, Rfl: 0   alendronate  (FOSAMAX ) 70 MG tablet, Take 1 tablet (70 mg total) by mouth every 7 (seven) days. Take with a full glass of water on an empty stomach., Disp: 12 tablet, Rfl: 3   ARIPiprazole  (ABILIFY ) 5 MG tablet, Take 1 tablet (5 mg total) by mouth daily., Disp: 90 tablet, Rfl: 0   ASPIRIN  81 PO, Take 81 mg by mouth daily., Disp: , Rfl:    atorvastatin  (LIPITOR) 10 MG tablet, Take 1 tablet (10 mg total) by mouth daily., Disp: 100 tablet, Rfl: 3   benzonatate  (TESSALON ) 100 MG capsule, Take 1-2 capsules (100-200 mg total) by mouth 3 (three)  times daily as needed for cough., Disp: 40 capsule, Rfl: 0   Ca Phosphate-Cholecalciferol (CALTRATE GUMMY BITES) 250-10 MG-MCG CHEW, Chew 1 gummy by mouth 2 (two) times daily., Disp: 180 tablet, Rfl: 3   estradiol  (ESTRACE ) 0.5 MG tablet, Take 1 tablet (0.5 mg total) by mouth daily., Disp: 90 tablet, Rfl: 3   famotidine  (PEPCID ) 10 MG tablet, Take by mouth., Disp: , Rfl:    levocetirizine (XYZAL ) 5 MG tablet, Take 1 tablet (5 mg total) by mouth every evening., Disp: 90 tablet, Rfl: 1   lidocaine  (LIDODERM ) 5 %, Place 1 patch onto the skin daily. Remove & Discard patch within 12 hours or as directed by MD, Disp: 30 patch, Rfl: 0   MAGNESIUM  PO, Take by mouth., Disp: , Rfl:    nortriptyline  (PAMELOR ) 10 MG capsule, Take 2 capsules (20 mg total) by mouth daily., Disp: 180 capsule, Rfl: 1   omeprazole  (PRILOSEC) 20 MG capsule, Take 1 capsule (20 mg total) by mouth in the  morning., Disp: 30 capsule, Rfl: 1   pregabalin  (LYRICA ) 150 MG capsule, Take 1 capsule (150 mg total) by mouth 3 (three) times daily., Disp: 90 capsule, Rfl: 1   tiZANidine  (ZANAFLEX ) 2 MG tablet, Take 1 tablet (2 mg total) by mouth at bedtime., Disp: 90 tablet, Rfl: 0   umeclidinium-vilanterol (ANORO ELLIPTA ) 62.5-25 MCG/ACT AEPB, Inhale 1 puff into the lungs daily., Disp: 180 each, Rfl: 1   vitamin B-12 (CYANOCOBALAMIN ) 500 MCG tablet, Take 250 mcg by mouth every morning., Disp: , Rfl:    VITAMIN D  PO, Take by mouth., Disp: , Rfl:    lenalidomide  (REVLIMID ) 10 MG capsule, TAKE 1 CAPSULE BY MOUTH EVERY DAY (Patient not taking: Reported on 05/19/2024), Disp: 28 capsule, Rfl: 0  Allergies  Allergen Reactions   Doxycycline  Rash    Rash on cheeks and sides of nose    Duloxetine  Rash    I personally reviewed active problem list, medication list, allergies with the patient/caregiver today.   ROS  Ten systems reviewed and is negative except as mentioned in HPI    Objective Physical Exam CONSTITUTIONAL: Patient appears well-developed and well-nourished. No distress. HEENT: Head atraumatic, normocephalic, neck supple. CARDIOVASCULAR: Normal rate, regular rhythm and normal heart sounds. No murmur heard. No BLE edema. PULMONARY: Effort normal and breath sounds normal. No respiratory distress. MUSCULOSKELETAL:slow gait, using a cane, muscles seems to be norma tonus and soft, no redness or masses on quads, seems stiff and pain on quads and hamstrings when walking but was able to lift both legs on her own during exam  PSYCHIATRIC: Patient has a normal mood and affect. Behavior is normal. Judgment and thought content normal.  Vitals:   05/19/24 1439 05/19/24 1442  BP: 126/74   Pulse: (!) 114 (!) 107  Resp: 16   SpO2: 94%   Weight: 165 lb 12.8 oz (75.2 kg)   Height: 5' 7 (1.702 m)     Body mass index is 25.97 kg/m.  Recent Results (from the past 2160 hours)  CBC with Differential  (Cancer Center Only)     Status: Abnormal   Collection Time: 03/11/24 10:47 AM  Result Value Ref Range   WBC Count 3.5 (L) 4.0 - 10.5 K/uL   RBC 3.39 (L) 3.87 - 5.11 MIL/uL   Hemoglobin 12.1 12.0 - 15.0 g/dL   HCT 65.0 (L) 63.9 - 53.9 %   MCV 102.9 (H) 80.0 - 100.0 fL   MCH 35.7 (H) 26.0 - 34.0 pg   MCHC  34.7 30.0 - 36.0 g/dL   RDW 86.3 88.4 - 84.4 %   Platelet Count 45 (L) 150 - 400 K/uL    Comment: Immature Platelet Fraction may be clinically indicated, consider ordering this additional test [id number]    nRBC 0.0 0.0 - 0.2 %   Neutrophils Relative % 14 %   Neutro Abs 0.5 (L) 1.7 - 7.7 K/uL   Lymphocytes Relative 76 %   Lymphs Abs 2.6 0.7 - 4.0 K/uL   Monocytes Relative 6 %   Monocytes Absolute 0.2 0.1 - 1.0 K/uL   Eosinophils Relative 2 %   Eosinophils Absolute 0.1 0.0 - 0.5 K/uL   Basophils Relative 1 %   Basophils Absolute 0.0 0.0 - 0.1 K/uL   WBC Morphology      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   RBC Morphology MORPHOLOGY UNREMARKABLE    Smear Review Normal platelet morphology     Comment: PLATELETS APPEAR DECREASED   Immature Granulocytes 1 %   Abs Immature Granulocytes 0.03 0.00 - 0.07 K/uL    Comment: Performed at [facility], [address] [place], [place] [address]  CMP (Cancer Center only)     Status: Abnormal   Collection Time: 03/11/24 10:47 AM  Result Value Ref Range   Sodium 138 135 - 145 mmol/L   Potassium 4.2 3.5 - 5.1 mmol/L   Chloride 103 98 - 111 mmol/L   CO2 25 22 - 32 mmol/L   Glucose, Bld 120 (H) 70 - 99 mg/dL    Comment: Glucose reference range applies only to samples taken after fasting for at least 8 hours.   BUN 20 6 - 20 mg/dL   Creatinine 8.62 (H) 9.55 - 1.00 mg/dL   Calcium  9.6 8.9 - 10.3 mg/dL   Total Protein 6.3 (L) 6.5 - 8.1 g/dL   Albumin 3.9 3.5 - 5.0 g/dL   AST 23 15 - 41 U/L   ALT 39 0 - 44 U/L   Alkaline Phosphatase 99 38 - 126 U/L   Total Bilirubin 1.1 0.0 - 1.2 mg/dL   GFR,  Estimated 46 (L) >60 mL/min    Comment: (NOTE) Calculated using the CKD-EPI Creatinine Equation (2021)    Anion gap 10 5 - 15    Comment: Performed at [facility], [address] [place], [place] [address]  Cytology - PAP     Status: Abnormal   Collection Time: 04/22/24 10:11 AM  Result Value Ref Range   High risk HPV Positive (A)    Adequacy Satisfactory for evaluation.    Diagnosis (A)     - Atypical squamous cells of undetermined significance (ASC-US )   Comment Normal Reference Range HPV - Negative   CMP (Cancer Center only)     Status: Abnormal   Collection Time: 04/22/24  2:09 PM  Result Value Ref Range   Sodium 135 135 - 145 mmol/L   Potassium 4.0 3.5 - 5.1 mmol/L   Chloride 104 98 - 111 mmol/L   CO2 23 22 - 32 mmol/L   Glucose, Bld 97 70 - 99 mg/dL    Comment: Glucose reference range applies only to samples taken after fasting for at least 8 hours.   BUN 19 6 - 20 mg/dL   Creatinine 8.62 (H) 9.55 - 1.00 mg/dL   Calcium  8.6 (L) 8.9 - 10.3 mg/dL   Total Protein 5.6 (L) 6.5 - 8.1 g/dL   Albumin 3.3 (L) 3.5 - 5.0 g/dL   AST  20 15 - 41 U/L   ALT 12 0 - 44 U/L   Alkaline Phosphatase 115 38 - 126 U/L   Total Bilirubin 0.6 0.0 - 1.2 mg/dL   GFR, Estimated 46 (L) >60 mL/min    Comment: (NOTE) Calculated using the CKD-EPI Creatinine Equation (2021)    Anion gap 8 5 - 15    Comment: Performed at [facility], [address] [place], [place] [address]  CBC with Differential (Cancer Center Only)     Status: Abnormal   Collection Time: 04/22/24  2:09 PM  Result Value Ref Range   WBC Count 3.2 (L) 4.0 - 10.5 K/uL   RBC 2.17 (L) 3.87 - 5.11 MIL/uL   Hemoglobin 7.9 (L) 12.0 - 15.0 g/dL   HCT 76.4 (L) 63.9 - 53.9 %   MCV 108.3 (H) 80.0 - 100.0 fL   MCH 36.4 (H) 26.0 - 34.0 pg   MCHC 33.6 30.0 - 36.0 g/dL   RDW 81.7 (H) 88.4 - 84.4 %   Platelet Count 44 (L) 150 - 400 K/uL    Comment: SPECIMEN CHECKED FOR CLOTS Immature Platelet Fraction may be clinically  indicated, consider ordering this additional test [id number]    nRBC 0.0 0.0 - 0.2 %   Neutrophils Relative % 21 %   Neutro Abs 0.7 (L) 1.7 - 7.7 K/uL   Lymphocytes Relative 59 %   Lymphs Abs 1.9 0.7 - 4.0 K/uL   Monocytes Relative 14 %   Monocytes Absolute 0.5 0.1 - 1.0 K/uL   Eosinophils Relative 1 %   Eosinophils Absolute 0.0 0.0 - 0.5 K/uL   Basophils Relative 0 %   Basophils Absolute 0.0 0.0 - 0.1 K/uL   WBC Morphology See Note     Comment: Mild Left Shift. 1 to 5% metas, occ myelos. DIFF. CONFIRMED BY SMEAR   RBC Morphology MORPHOLOGY UNREMARKABLE    Smear Review See Note     Comment: PLATELETS APPEAR DECREASED   Immature Granulocytes 5 %   Abs Immature Granulocytes 0.17 (H) 0.00 - 0.07 K/uL    Comment: Performed at [facility], [address] [place], [place] [address]  IntelliGEN Myeloid     Status: None   Collection Time: 04/22/24  3:22 PM  Result Value Ref Range   Specimen Type See Scanned report in [place] Link    Clinical Indication See Scanned report in [place] Link    RESULT SUMMARY See Scanned report in [place] Link    INTERPRETATION See Scanned report in [place] Link    METHODOLOGY See Scanned report in [place] Link    REFERENCES See Scanned report in [place] Link    DIRECTOR REVIEW See Scanned report in  Link     Comment: Performed at [facility], [address] [place], [place] [address]  Folic Acid      Status: None   Collection Time: 04/22/24  3:22 PM  Result Value Ref Range   Folate 8.5 >5.9 ng/mL    Comment: Performed at [facility], [address] [place], [place] [address]  Haptoglobin     Status: Abnormal   Collection Time: 04/22/24  3:22 PM  Result Value Ref Range   Haptoglobin <10 (L) 33 - 346 mg/dL    Comment: (NOTE) Performed At: [facility] [address] [place], [place] [id number] Jennette Shorter MD Ey:[id number]   Vitamin B12     Status: Abnormal    Collection Time:  04/22/24  3:22 PM  Result Value Ref Range   Vitamin B-12 989 (H) 180 - 914 pg/mL    Comment: (NOTE) This assay is not validated for testing neonatal or myeloproliferative syndrome specimens for Vitamin B12 levels. Performed at [facility], [address] [place], [place] [address]   Lactate dehydrogenase     Status: Abnormal   Collection Time: 04/22/24  3:22 PM  Result Value Ref Range   LDH 276 (H) 98 - 192 U/L    Comment: Performed at [facility], [address] [place], [place] [address]  Iron  and TIBC     Status: None   Collection Time: 04/22/24  3:22 PM  Result Value Ref Range   Iron  58 28 - 170 ug/dL   TIBC 676 749 - 549 ug/dL   Saturation Ratios 18 10.4 - 31.8 %   UIBC 265 ug/dL    Comment: Performed at [facility], [address] [place], [place] [address]  Ferritin     Status: None   Collection Time: 04/22/24  3:22 PM  Result Value Ref Range   Ferritin 89 11 - 307 ng/mL    Comment: Performed at [facility], [address] [place], [place] [address]  Reticulocytes     Status: Abnormal   Collection Time: 04/22/24  3:23 PM  Result Value Ref Range   Retic Ct Pct 2.1 0.4 - 3.1 %   RBC. 2.19 (L) 3.87 - 5.11 MIL/uL   Retic Count, Absolute 45.8 19.0 - 186.0 K/uL   Immature Retic Fract 6.4 2.3 - 15.9 %    Comment: Performed at [facility], [address] [place], [place] [address]  Type and screen     Status: None   Collection Time: 04/22/24  3:23 PM  Result Value Ref Range   ABO/RH(D) A POS    Antibody Screen NEG    Sample Expiration 04/25/2024,2359    Unit Number [id number]    Blood Component Type RCLI PHER 1    Unit division 00    Status of Unit ISSUED,FINAL    Transfusion Status OK TO TRANSFUSE    Crossmatch Result      Compatible Performed at [facility], [address] [place], [place] [address]   BPAM RBC     Status: None   Collection Time: 04/22/24  3:23 PM  Result Value Ref  Range   ISSUE DATE / TIME [id number]    Blood Product Unit Number [id number]    PRODUCT CODE [id number]    Unit Type and Rh 6200    Blood Product Expiration Date 797491717640   Prepare RBC (crossmatch)     Status: None   Collection Time: 04/22/24  4:30 PM  Result Value Ref Range   Order Confirmation      ORDER PROCESSED BY BLOOD BANK Performed at [facility], [address] [place], [place] [address]   Surgical pathology     Status: None   Collection Time: 04/26/24 12:00 AM  Result Value Ref Range   SURGICAL PATHOLOGY      Surgical Pathology CASE: [id number] PATIENT: RANDINE SHARPS Bone Marrow Report     Clinical History: CLL, thrombocytopenia - restaging     DIAGNOSIS:  BONE MARROW, ASPIRATE, CLOT, CORE: - Hypercellular bone marrow (90%) involved by the patient's known chronic lymphocytic leukemia/small lymphocytic lymphoma.  See comment.  PERIPHERAL BLOOD: - Thrombocytopenia, absolute lymphocytosis  COMMENT: The bone marrow shows a diffuse lymphocytic infiltrate comprising 95%  of the bone marrow cellularity.  Findings are consistent with involvement by the patient's known chronic lymphocytic leukemia.  Flow cytometric analysis also identified a clonal B-cell population constituting 96% of the lymphocytes.  The cells are positive for CD5, CD19, CD200 and express lambda light chains.  Correlation with pending cytogenetics is recommended for further assessment.  MICROSCOPIC DESCRIPTION:  PERIPHERAL BLOOD SMEAR: Platelets: Decreased, no platelet clumps identified Erythroi d: Macrocytosis, anisopoikilocytosis Leukocytes: Absolute lymphocytosis, neutropenia  BONE MARROW ASPIRATE: Cellular The bone marrow aspirate shows a predominant population of lymphocytes with minimal background trilineage hematopoiesis  TOUCH PREPARATIONS: Cellular, confirmatory of aspirate findings  CLOT AND BIOPSY: The bone marrow clot and biopsy show a  diffuse lymphocytic infiltrate comprising 90% of the cellularity with minimal background trilineage hematopoiesis.  The findings are confirmatory of the touch prep and aspirate impression.  SPECIAL STAINS: CD3: CD3 highlights T lymphocytes CD5: CD5 highlights T lymphocytes and neoplastic B lymphocytes CD79a: CD79a highlights neoplastic B lymphocytes CD23: CD23 highlights neoplastic B lymphocytes IRON  STAIN: Iron  stains are performed on a bone marrow aspirate or touch imprint smear and section of clot. The controls stained appropriately.       Storage Iron : Adequate      Ring Sideroblasts: Not identified   AD DITIONAL DATA/TESTING: Cytogenetics   CELL COUNT DATA:  Bone Marrow count performed on 500 cells shows: Blasts:   0%   Myeloid:  3% Promyelocytes: 0%   Erythroid:     7% Myelocytes:    2%   Lymphocytes:   89% Metamyelocytes:     0%   Plasma cells:  0% Bands:    0% Neutrophils:   1%   M:E ratio:     0.42 Eosinophils:   0% Basophils:     0% Monocytes:     1%  Lab Data: CBC performed on 04/26/2024 shows: WBC: 6.0 k/uL  Neutrophils:   38% Hgb: 3.11 g/dL Lymphocytes:   43% HCT: 31.8 %    Monocytes:     5% MCV: 102.3 fL  Eosinophils:   1% RDW: 19.6 %    Basophils:     0% PLT: 41 k/uL    GROSS DESCRIPTION:  A: Aspirate smear  B: The specimen is received in B plus fixative, and consists of a 25.0 x 5.0 x 2.0 mm aggregate of red-brown clotted blood.  The specimen is entirely submitted in 1 cassette.  C: The specimen is received in B plus fixative, and consists of 3 core and core fragments of red-brown bone, ranging from 0.2 cm to 2.2 cm in length by 0.2 cm in  diameter.  The specimen is entirely submitted in 1 cassette following decalcification with Immunocal.  SHIRLEEN 04/26/2024)   Final Diagnosis performed by Ilsa Pottier, MD.   Electronically signed 04/28/2024 Technical and / or Professional components performed at [facility], [address], [name], [place] [address].  Immunohistochemistry Technical component (if applicable) was performed at [facility]. [address], [place], [place] [address].   IMMUNOHISTOCHEMISTRY DISCLAIMER (if applicable): Some of these immunohistochemical stains may have been developed and the performance characteristics determine by [facility]. Some may not have been cleared or approved by the U.S. Food and Drug Administration. The FDA has determined that such clearance or approval is not necessary. This test is used for clinical purposes. It should not be regarded as investigational or for research. This laboratory is certified under t he Clinical Laboratory Improvement Amendments of 1988 (CLIA-88)  as qualified to perform high complexity clinical laboratory testing.  The controls stained appropriately.   IHC stains are performed on formalin fixed, paraffin embedded tissue using a 3,3diaminobenzidine (DAB) chromogen and Leica Bond Autostainer System. The staining intensity of the nucleus is score manually and is reported as the percentage of tumor cell nuclei demonstrating specific nuclear staining. The specimens are fixed in 10% Neutral Formalin for at least 6 hours and up to 72hrs. These tests are validated on decalcified tissue. Results should be interpreted with caution given the possibility of false negative results on decalcified specimens. Antibody Clones are as follows ER-clone 71F, PR-clone 16, Ki67- clone MM1. Some of these immunohistochemical stains may have been developed and the performance characteristics determined by [facility].   Surgical pathology     Status: None   Collection Time: 04/26/24 12:00 AM  Result Value Ref Range   SURGICAL PATHOLOGY      Surgical Pathology CASE: [id number] PATIENT: RANDINE SHARPS Flow Pathology Report     Clinical history: CLL, thrombocytopenia-restaging     DIAGNOSIS:  - Abnormal  clonal B-cell population identified.  See comment.  COMMENT:  Flow cytometric analysis identified a clonal B-cell population constituting 96% of lymphocytes. The B cells are positive for CD5, CD19, CD200 and express lambda light chains.  CD20 is negative, likely due to treatment.  The findings are consistent with involvement by the patient's known chronic lymphocytic leukemia/small lymphocytic lymphoma.   GATING AND PHENOTYPIC ANALYSIS:  Gated population: Flow cytometric immunophenotyping is performed using antibodies to the antigens listed in the table below. Electronic gates are placed around a cell cluster displaying light scatter properties corresponding to: lymphocytes  Abnormal Cells in gated population: 96%  Phenotype of Abnormal Cells: CD5, CD19, CD200, Lambda                        Lymphoid Antigens       Myeloid Antigens Miscellaneous CD2  NEG  CD10 NEG  CD11b     ND   CD45 POS CD3  NEG  CD19 POS  CD11c     ND   HLA-Dr    ND CD4  NEG  CD20 POS  CD13 ND   CD34 NEG CD5  POS  CD22 ND   CD14 ND   CD38 NEG CD7  NEG  CD79b     ND   CD15 ND   CD138     ND CD8  NEG  CD103     ND   CD16 ND   TdT  ND CD25 ND   CD200     POS  CD33 ND   CD123     ND TCRab     ND   sKappa    NEG  CD64 ND   CD41 ND TCRgd     NEG  sLambda   POS  CD117     ND   CD61 ND CD56 NEG  cKappa    ND   MPO  ND   CD71 ND CD57 ND   cLambda   ND        CD235aND      GROSS DESCRIPTION:  Reference bone marrow case [id number].    Final Diagnosis performed by Ilsa Pottier, MD.   Electronically signed 04/27/2024 Technical and / or Professional components performed at [facility], [address] [place], [place] [address].  The above tests were developed and their performance characteristics determined by the [facility] system for  the physical and immunopheno typic characterization of cell populations. They have not been cleared by the U.S. Food and Drug administration. The  FDA  has determined that such clearance or approval is not necessary. This test is used for clinical purposes. It should not be  regarded as investigational or for research   CBC with Differential/Platelet     Status: Abnormal   Collection Time: 04/26/24  7:55 AM  Result Value Ref Range   WBC 6.0 4.0 - 10.5 K/uL   RBC 3.11 (L) 3.87 - 5.11 MIL/uL   Hemoglobin 10.7 (L) 12.0 - 15.0 g/dL   HCT 68.1 (L) 63.9 - 53.9 %   MCV 102.3 (H) 80.0 - 100.0 fL   MCH 34.4 (H) 26.0 - 34.0 pg   MCHC 33.6 30.0 - 36.0 g/dL   RDW 80.3 (H) 88.4 - 84.4 %   Platelets 41 (L) 150 - 400 K/uL    Comment: PLATELET COUNT CONFIRMED BY SMEAR Immature Platelet Fraction may be clinically indicated, consider ordering this additional test [id number]    nRBC 0.0 0.0 - 0.2 %   Neutrophils Relative % 18 %   Neutro Abs 1.1 (L) 1.7 - 7.7 K/uL   Lymphocytes Relative 67 %   Lymphs Abs 4.2 (H) 0.7 - 4.0 K/uL   Monocytes Relative 8 %   Monocytes Absolute 0.5 0.1 - 1.0 K/uL   Eosinophils Relative 1 %   Eosinophils Absolute 0.0 0.0 - 0.5 K/uL   Basophils Relative 1 %   Basophils Absolute 0.0 0.0 - 0.1 K/uL   WBC Morphology See Note     Comment: 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   RBC Morphology MORPHOLOGY UNREMARKABLE    Smear Review See Note     Comment: Large Platelets Present   Immature Granulocytes 5 %   Abs Immature Granulocytes 0.27 (H) 0.00 - 0.07 K/uL    Comment: Performed at [facility], [address] [place], [place] [address]  Haptoglobin     Status: Abnormal   Collection Time: 04/29/24 11:26 AM  Result Value Ref Range   Haptoglobin <10 (L) 33 - 346 mg/dL    Comment: (NOTE) Performed At: [facility] [address] [place], [place] [id number] Jennette Shorter MD Ey:[phone]   Lactate dehydrogenase     Status: Abnormal   Collection Time: 04/29/24 11:26 AM  Result Value Ref Range   LDH 309 (H) 98 - 192 U/L    Comment: Performed at [facility],  [address] [place], [place] [address]  CBC with Differential/Platelet     Status: Abnormal   Collection Time: 04/29/24 11:26 AM  Result Value Ref Range   WBC 4.4 4.0 - 10.5 K/uL   RBC 2.84 (L) 3.87 - 5.11 MIL/uL   Hemoglobin 9.8 (L) 12.0 - 15.0 g/dL   HCT 70.8 (L) 63.9 - 53.9 %   MCV 102.5 (H) 80.0 - 100.0 fL   MCH 34.5 (H) 26.0 - 34.0 pg   MCHC 33.7 30.0 - 36.0 g/dL   RDW 80.6 (H) 88.4 - 84.4 %   Platelets 41 (L) 150 - 400 K/uL    Comment: Immature Platelet Fraction may be clinically indicated, consider ordering this additional test [id number]    nRBC 0.0 0.0 - 0.2 %   Neutrophils Relative % 26 %   Neutro Abs 1.1 (L) 1.7 - 7.7 K/uL   Lymphocytes Relative 62 %   Lymphs Abs 2.8 0.7 - 4.0 K/uL   Monocytes Relative 5 %  Monocytes Absolute 0.2 0.1 - 1.0 K/uL   Eosinophils Relative 1 %   Eosinophils Absolute 0.0 0.0 - 0.5 K/uL   Basophils Relative 1 %   Basophils Absolute 0.0 0.0 - 0.1 K/uL   WBC Morphology See Note     Comment: Mild Left Shift (1-5% metas, occ myelo)   RBC Morphology MORPHOLOGY UNREMARKABLE    Smear Review Normal platelet morphology    Immature Granulocytes 5 %   Abs Immature Granulocytes 0.21 (H) 0.00 - 0.07 K/uL    Comment: Performed at [facility], [address] [place], [place] [address]  Glucose, capillary     Status: None   Collection Time: 05/04/24  1:13 PM  Result Value Ref Range   Glucose-Capillary 97 70 - 99 mg/dL    Comment: Glucose reference range applies only to samples taken after fasting for at least 8 hours.  CMP (Cancer Center only)     Status: Abnormal   Collection Time: 05/10/24  9:27 AM  Result Value Ref Range   Sodium 135 135 - 145 mmol/L   Potassium 3.8 3.5 - 5.1 mmol/L   Chloride 101 98 - 111 mmol/L   CO2 24 22 - 32 mmol/L   Glucose, Bld 107 (H) 70 - 99 mg/dL    Comment: Glucose reference range applies only to samples taken after fasting for at least 8 hours.   BUN 22 (H) 6 - 20 mg/dL   Creatinine 8.82 (H) 9.55 -  1.00 mg/dL   Calcium  9.4 8.9 - 10.3 mg/dL   Total Protein 6.0 (L) 6.5 - 8.1 g/dL   Albumin 3.6 3.5 - 5.0 g/dL   AST 27 15 - 41 U/L   ALT 15 0 - 44 U/L   Alkaline Phosphatase 117 38 - 126 U/L   Total Bilirubin 1.2 0.0 - 1.2 mg/dL   GFR, Estimated 55 (L) >60 mL/min    Comment: (NOTE) Calculated using the CKD-EPI Creatinine Equation (2021)    Anion gap 10 5 - 15    Comment: Performed at [facility], [address] [place], [place] [address]  Hold Tube- Blood Bank     Status: None   Collection Time: 05/10/24  9:29 AM  Result Value Ref Range   Blood Bank Specimen SAMPLE AVAILABLE FOR TESTING    Sample Expiration      05/13/2024,2359 Performed at [facility], [address] [place], [place] [address]   Lactate dehydrogenase     Status: Abnormal   Collection Time: 05/10/24  9:29 AM  Result Value Ref Range   LDH 343 (H) 98 - 192 U/L    Comment: Performed at [facility], [address] [place], [place] [address]  CBC with Differential/Platelet     Status: Abnormal   Collection Time: 05/10/24  9:29 AM  Result Value Ref Range   WBC 5.2 4.0 - 10.5 K/uL   RBC 2.59 (L) 3.87 - 5.11 MIL/uL   Hemoglobin 9.2 (L) 12.0 - 15.0 g/dL   HCT 73.2 (L) 63.9 - 53.9 %   MCV 103.1 (H) 80.0 - 100.0 fL   MCH 35.5 (H) 26.0 - 34.0 pg   MCHC 34.5 30.0 - 36.0 g/dL   RDW 79.8 (H) 88.4 - 84.4 %   Platelets 46 (L) 150 - 400 K/uL    Comment: Immature Platelet Fraction may be clinically indicated, consider ordering this additional test [id number]    nRBC 0.0 0.0 - 0.2 %   Neutrophils Relative % 24 %  Neutro Abs 1.2 (L) 1.7 - 7.7 K/uL   Lymphocytes Relative 66 %   Lymphs Abs 3.5 0.7 - 4.0 K/uL   Monocytes Relative 5 %   Monocytes Absolute 0.2 0.1 - 1.0 K/uL   Eosinophils Relative 1 %   Eosinophils Absolute 0.1 0.0 - 0.5 K/uL   Basophils Relative 1 %   Basophils Absolute 0.0 0.0 - 0.1 K/uL   WBC Morphology See Note     Comment: diff. confirmed by smear   RBC Morphology  MORPHOLOGY UNREMARKABLE    Smear Review See Note     Comment: PLATELETS APPEAR DECREASED   Immature Granulocytes 3 %   Abs Immature Granulocytes 0.14 (H) 0.00 - 0.07 K/uL    Comment: Performed at [facility], [address] [place], [place] [address]  Cytology - Non PAP;     Status: None   Collection Time: 05/11/24 12:00 AM  Result Value Ref Range   CYTOLOGY - NON GYN      CYTOLOGY - NON PAP [facility] [address] [place], [place] [address] Telephone [phone] or [phone] Fax [phone]  CYTOPATHOLOGY REPORT   Accession #: [id number] Patient Name: CORAIMA, TIBBS Visit # : [id number]  MRN: [id number] Physician: Jacobo Lye DOB/Age [birth year] (Age: 25) Gender: F Collected Date: 05/11/2024 Received Date: 05/11/2024  FINAL DIAGNOSIS STATEMENT Of SPECIMEN ADEQUACY:  INTERPRETATION(S):      - LYMPHOCYTE PREDOMINANT EFFUSION CONSISTENT WITH INVOLVEMENT BY PATIENT'S KNOWN      CLL/SLL BASED ON FLOW CYTOMETRIC ANALYSIS.   Diagnosis Note : Per [facility] the patient has CLL/SLL. 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 ThinPrep): 1      # Cell Block: 1      # Diff-Quick Stain: 0        Report signed out from the following location(s) [facility] [address] [place], [place] [address] CLIA #: [id number]  [facility] 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    ANNOTATION COMMENT IMP Comment     Comment: (NOTE) Findings represent involvement by CLL/SLL. Cytomorphologic correlation is required for definitive diagnosis. Correlation with the fluid cell count and cytologic findings is required. Prior flow cytometry results have been reviewed and showed similar findings.    CLINICAL INFO Comment     Comment: Chronic Lymphocytic Leukemia   Specimen Type Comment     Comment: (NOTE)  Pleural fluid/chest cavity fluid *includes thoracentesis fluid and pleural effusion    ASSESSMENT OF LEUKOCYTES Comment     Comment: (NOTE) A CD5+, CD23+ and CD43+ monoclonal B-cell population (94% of total viable lymphoid cells, 100% of B cells) is detected with dim lambda light chain restriction in the background of many polytypic B cells, negative for FMC7 and CD38. There is no loss of, or aberrant expression of, the pan T cell antigens to suggest a neoplastic T cell process. CD4:CD8 ratio 2.4 Analysis of the viable lymphoid cells shows: B cells 94%, T cells 6%    % Viable Cells Comment     Comment: 97%   Immunophenotypic Profile 94% of total cells (Phenotype below)     Comment: Comment Abnormal cell population: present    ANALYSIS AND GATING STRATEGY Comment     Comment: (NOTE) 8 color analysis with 7-AAD, CD45/SSC gating  Technical-Analysis performed at [facility] [employer] [facility]  [employer], [address] [place] [address], Director: Aniceto Christen, MDPhD, [phone]    IMMUNOPHENOTYPING STUDY Comment     Comment: (NOTE) CD2       (-)            CD3       (-) CD4       (-)            CD5       (+) CD7       (-)            CD8       (-) CD10      (-)            CD11b     (-) CD19      (+)            CD20      (+) Dim CD23      (+)            CD30      (-) CD38      (-)            CD43      (+) CD45      (+)            CD56      (-) CD57      (-)            FMC-7     (-) HLA-DR    (+)            KAPPA     (-) LAMBDA    (+) Dim    PATHOLOGIST NAME Comment     Comment: Rumalda Nearing, M.D.   COMMENT: Comment     Comment: (NOTE) Each antibody in this assay was utilized to assess for potential abnormalities of studied cell populations or to characterize identified abnormalities. This test was developed and its performance characteristics determined by [facility].  It has not been cleared or approved by the U.S. Food and Drug Administration. The FDA has determined that such clearance or approval is not necessary. This test is used for clinical purposes.  It should not be regarded as investigational or for research. Performed At: [facility] [place] [address] [place], [place] [id number] Christen Aniceto MDPhD Ey:[phone] Performed At: [facility] [place] [address] [place], [place] [id number] Christen Aniceto MDPhD Ey:[phone]      PHQ2/9:    05/19/2024    2:33 PM 05/03/2024   10:38  AM 04/22/2024    2:20 PM 03/11/2024   11:02 AM 03/03/2024   12:59 PM  Depression screen PHQ 2/9  Decreased Interest 0 0 0 0 0  Down, Depressed, Hopeless 0 0 0 0 0  PHQ - 2 Score 0 0 0 0 0  Altered sleeping 0 0 0 0 0  Tired, decreased energy 0 0 0 0 0  Change in appetite 0 0 0 0 0  Feeling bad or failure about yourself  0 0 0 0 0  Trouble concentrating 0 0 0 0 0  Moving slowly or fidgety/restless 0 0 0 0 0  Suicidal thoughts 0 0 0 0 0  PHQ-9 Score 0 0 0 0 0  Difficult  doing work/chores Not difficult at all Not difficult at all   Not difficult at all    phq 9 is negative  Fall Risk:    05/19/2024    2:33 PM 05/03/2024   10:38 AM 03/03/2024   12:59 PM 02/06/2024   11:29 AM 01/29/2024    9:49 AM  Fall Risk   Falls in the past year? 0 0 0 0 0  Number falls in past yr: 0 0 0 0 0  Injury with Fall? 0 0 0 0 0  Risk for fall due to : No Fall Risks No Fall Risks No Fall Risks No Fall Risks No Fall Risks  Follow up Falls evaluation completed Falls evaluation completed Education provided;Falls evaluation completed;Falls prevention discussed Falls prevention discussed;Education provided;Falls evaluation completed Falls prevention discussed;Education provided;Falls evaluation completed     Assessment & Plan Bilateral thigh myalgia New bilateral anterior thigh pain with weakness, likely muscular involving quadriceps and hamstrings. Differential includes muscle strain or overuse. No nerve involvement or acute injury. Bone survey unremarkable. - Prescribe Amla cream for topical pain relief. - Order CK test to assess muscle breakdown. - Advise increased water intake. - Instruct to apply 2 grams of Emla  cream to each thigh and massage. - Discuss holding atorvastatin  if CK elevated.  Chronic lymphocytic leukemia, not in remission CLL with diffuse osseous hypermetabolism on PET scan, likely treatment-related. Current treatment options exhausted. - Coordinate referral to [facility] for further CLL management. - Consider [facility] referral if [facility] is not feasible.  Right pleural effusion, status post thoracentesis Right pleural effusion managed with thoracentesis on August 26. Awaiting fluid analysis to rule out malignancy. Residual fluid not significant for immediate intervention. - Await results of fluid analysis to determine etiology.

## 2024-05-19 NOTE — Telephone Encounter (Signed)
 Call received from [facility] that patient is not eligible for CAR T treatment due to insurance, discussed with Dr. Jacobo and he recommends referral to [facility]   RN placed call to patient to update, patient verbalized understanding and is in agreement.   Referral faxed to [facility], will provide update to patient when available.

## 2024-05-19 NOTE — Telephone Encounter (Signed)
 Brandy from [facility] CAR-T program called to inform Dr. Jacobo that this patient is not eligible for the program since her insurance is considered out of network.  Notified Marjorie Parkin, RN, Dr. Jerone nurse.

## 2024-05-20 ENCOUNTER — Other Ambulatory Visit: Payer: Self-pay

## 2024-05-20 ENCOUNTER — Ambulatory Visit: Payer: Self-pay | Admitting: Family Medicine

## 2024-05-21 ENCOUNTER — Other Ambulatory Visit: Payer: Self-pay

## 2024-05-24 ENCOUNTER — Telehealth: Payer: Self-pay

## 2024-05-24 NOTE — Telephone Encounter (Unsigned)
 Copied from CRM [id number]. Topic: Clinical - Medical Advice >> May 24, 2024 12:27 PM Leonette P wrote: Reason for CRM: pt called saying the medication that was given to her last week for her legs is not working.  Please advise [phone]

## 2024-05-25 ENCOUNTER — Telehealth: Payer: Self-pay | Admitting: *Deleted

## 2024-05-25 NOTE — Telephone Encounter (Signed)
 [facility] called back about the referral of this patient and she is out of network so she should go somewhere where she can be in network for her insurance

## 2024-05-25 NOTE — Telephone Encounter (Signed)
 NA

## 2024-05-25 NOTE — Telephone Encounter (Signed)
 I reached out to [facility] to ask if there is any advice regarding insurance eligibility as now [facility] and [facility] have both declined referral. Patient has [employer] (Primary) and [place] [employer] (Secondary). Patients medicare plan does not cover these facilities. [facility] advised that we can request out of network coverage for CAR T-cell therapy given medical necessity. If request to be treated at [facility] is received, approval can be faxed to [phone].   I called and spoke with [facility], request has to be faxed to [phone].  Referral has also been sent to [facility] to see if they are in network, if not will proceed with appeal for patient to be seen at [facility].   MD aware of referral status at this time.

## 2024-05-26 ENCOUNTER — Other Ambulatory Visit: Payer: Self-pay | Admitting: Family Medicine

## 2024-05-26 ENCOUNTER — Telehealth: Payer: Self-pay | Admitting: *Deleted

## 2024-05-26 DIAGNOSIS — M7918 Myalgia, other site: Secondary | ICD-10-CM

## 2024-05-26 NOTE — Telephone Encounter (Signed)
 Patient wants to know whether or not if she got a referral and appointment with [facility].  Marjorie talked to [facility] and I reached out to [facility] to ask if there is any advice regarding insurance eligibility as now [facility] and [facility] have both declined .  She is now trying to get [facility] to see if they can see her.  If that does not work they will try to go through  insurance and try again with [facility].  she does not want to get worse while getting this set up.,

## 2024-05-29 ENCOUNTER — Other Ambulatory Visit: Payer: Self-pay | Admitting: Family Medicine

## 2024-05-31 ENCOUNTER — Other Ambulatory Visit: Payer: Self-pay

## 2024-05-31 ENCOUNTER — Other Ambulatory Visit (HOSPITAL_COMMUNITY): Payer: Self-pay

## 2024-05-31 DIAGNOSIS — C91Z0 Other lymphoid leukemia not having achieved remission: Secondary | ICD-10-CM | POA: Diagnosis not present

## 2024-05-31 DIAGNOSIS — C9110 Chronic lymphocytic leukemia of B-cell type not having achieved remission: Secondary | ICD-10-CM | POA: Diagnosis not present

## 2024-05-31 MED ORDER — CALTRATE GUMMY BITES 250-10 MG-MCG PO CHEW
1.0000 | CHEWABLE_TABLET | Freq: Two times a day (BID) | ORAL | 3 refills | Status: AC
  Filled 2024-06-08: qty 200, 100d supply, fill #0

## 2024-06-02 DIAGNOSIS — C91Z0 Other lymphoid leukemia not having achieved remission: Secondary | ICD-10-CM | POA: Diagnosis not present

## 2024-06-02 NOTE — Therapy (Unsigned)
 OUTPATIENT PHYSICAL THERAPY LOWER EXTREMITY EVALUATION   Patient Name: Monique [name] MRN: [id number] DOB:May 03, 1971, 53 y.o., female Today's Date: 06/03/2024  END OF SESSION:  PT End of Session - 06/03/24 0907     Visit Number 1    Number of Visits 16    Date for Recertification  07/29/24    PT Start Time 0930    PT Stop Time 1013    PT Time Calculation (min) 43 min    Activity Tolerance Patient limited by pain    Behavior During Therapy [facility] for tasks assessed/performed          Past Medical History:  Diagnosis Date   Anxiety    Bursitis    leg pain   Carpal tunnel syndrome    Cervical dysplasia    hx LEEP over 18 years ago.    Chronic lymphocytic leukemia (HCC) 2018   Dr Jacobo.  (in lymph nodes)   COPD (chronic obstructive pulmonary disease) (HCC)    COVID-19 2021   Depression    Eating disorder    GERD (gastroesophageal reflux disease)    History of self-harm    Insomnia    Obsession    Tobacco abuse    Vitamin B12 deficiency (non anemic)    Past Surgical History:  Procedure Laterality Date   BREAST BIOPSY Right 01/05/2018   US  guided biopsy of 2 areas and 1 lymph node, MIXED INFLAMMATION AND GIANT CELL REACTION   CERVICAL BIOPSY  W/ LOOP ELECTRODE EXCISION     COLONOSCOPY WITH PROPOFOL  N/A 04/21/2019   Procedure: COLONOSCOPY WITH PROPOFOL ;  Surgeon: Janalyn Keene NOVAK, MD;  Location: [facility];  Service: Gastroenterology;  Laterality: N/A;   IR BONE MARROW BIOPSY & ASPIRATION  04/26/2024   OTHER SURGICAL HISTORY     scar tissue removed from vocal cords   TUBAL LIGATION     VAGINAL HYSTERECTOMY N/A 01/22/2021   Procedure: HYSTERECTOMY VAGINAL; BILATERAL SALPINGECTOMY;  Surgeon: Connell Davies, MD;  Location: [facility] OR[facility];  Service: Gynecology;  Laterality: N/A;   vocal cord surgery  2005   Patient Active Problem List   Diagnosis Date Noted   Stage 3a chronic kidney disease (HCC) 10/02/2023   Senile purpura (HCC) 10/02/2023   Major depression in  remission (HCC) 10/02/2023   Pancytopenia (HCC) 10/02/2023   Post-menopause on HRT (hormone replacement therapy) 11/08/2022   At risk for sleep apnea 09/19/2021   Lumbar foraminal stenosis (L4-5) (Bilateral) (R>L) 06/05/2021   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 06/05/2021   Lumbosacral radiculopathy/radiculitis at L5 (Bilateral) 06/05/2021   PAD (peripheral artery disease) (HCC) 02/14/2021   CIN II (cervical intraepithelial neoplasia II) 02/01/2021   S/P vaginal hysterectomy 01/22/2021   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/09/2020   Bilateral leg pain 11/03/2019   Iron  deficiency anemia 10/15/2019   Bilateral arm pain 08/05/2019   Bilateral hand numbness 08/05/2019   Weakness of both hands 08/05/2019   Chronic musculoskeletal pain 07/19/2019   Chronic neuropathic pain 07/19/2019   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   Special screening for malignant neoplasms, colon    Polyp of sigmoid colon    Hemorrhoids    Trigeminal neuralgia 10/05/2018   Lumbar radiculitis (Right) 09/24/2018   Hypocalcemia 06/03/2018   Vitamin D  insufficiency 06/03/2018   Abnormal MRI, lumbar spine (06/02/2020) 06/03/2018   Chronic hip pain (Right) 06/03/2018   Spondylosis without myelopathy or radiculopathy, lumbar region 06/03/2018   DDD (degenerative disc disease), lumbar 06/02/2018   Lumbar facet hypertrophy 06/02/2018  Lumbar facet arthropathy 06/02/2018   Lumbar facet syndrome (Bilateral) (R>L) 06/02/2018   Marijuana use 05/19/2018   Chronic lower extremity pain (1ry area of Pain) (Bilateral) (R>L) 05/13/2018   Chronic low back pain (2ry area of Pain) (Bilateral) (R>L) w/ sciatica (Bilateral) 05/13/2018   Chronic pain syndrome 05/13/2018   Disorder of skeletal system 05/13/2018   Pharmacologic therapy 05/13/2018   Problems influencing health status 05/13/2018   Mastalgia 05/05/2018   Breast mass, right 01/07/2018   Face pain 12/11/2017   Tingling 12/11/2017    Thoracic aortic atherosclerosis (HCC) 08/20/2017   Emphysema of lung (HCC) 08/20/2017   Adductor tendinitis 04/23/2017   Trochanteric bursitis of right hip 04/23/2017   GERD without esophagitis 12/11/2016   CLL (chronic lymphocytic leukemia) (HCC) 11/24/2016   Stress incontinence 09/04/2016   B12 deficiency 07/10/2015   Insomnia 07/10/2015   Anorexia nervosa, restricting type 07/10/2015   Anxiety, generalized 07/10/2015   H/O suicide attempt 07/10/2015   Lymphocytosis 07/10/2015   Dysplasia of cervix, low grade (CIN 1) 07/10/2015   Obsessive-compulsive disorder 07/10/2015   Tobacco use 07/10/2015   History of cervical dysplasia 07/18/2014    PCP: Glenard Mire, MD   REFERRING PROVIDER: Sowles, Krichna, MD   REFERRING DIAG: M79.18 (ICD-10-CM) - Pain of thigh muscle   THERAPY DIAG:  Abnormality of gait and mobility  Difficulty in walking, not elsewhere classified  Muscle weakness (generalized)  Other abnormalities of gait and mobility  Unsteadiness on feet  Rationale for Evaluation and Treatment: Rehabilitation  ONSET DATE: 05/03/24  SUBJECTIVE:   SUBJECTIVE STATEMENT:  Pt has significant discomfort in her left ant thigh in the inguinal crease. Pain radiates down the leg from there. Pt also has neuropathy on the R thigh. Pain on the left began about 1 month ago. She went to PCP, got some numbing cream. Pt is still considering options for seeing MD but is awaiting for PT recommendations. Pt does not recall when the pain began. She does know that rolling in bed causes a lot of pain.   PERTINENT HISTORY: Past medical history of bursitis, anxiety, chronic lymphocytic leukemia, COPD, depression, and vitamin B12 deficiency. PAIN:  Are you having pain? Yes: NPRS scale: 10/10 walking, no pain sitting, 10/10 rolling over in bed  Pain location: left ant hip along inguinal crease  Pain description: sharp, radiating  Aggravating factors: walking, rolling over in bed   Relieving factors: sitting, rest   PRECAUTIONS: Fall  RED FLAGS: Known lymphocytic leukemia    WEIGHT BEARING RESTRICTIONS: No  FALLS:  Has patient fallen in last 6 months? No  LIVING ENVIRONMENT: Lives with: lives alone Lives in: House/apartment Stairs: Yes: External: 3 steps; bilateral but cannot reach both Has following equipment at home: Single point cane  OCCUPATION: n/a  PLOF: Independent  PATIENT GOALS: improve pain, mobility, and function   NEXT MD VISIT: Not scheduled for this problem yet.   OBJECTIVE:  Note: Objective measures were completed at Evaluation unless otherwise noted.  DIAGNOSTIC FINDINGS: no images yet   PATIENT SURVEYS:  LEFS  Extreme difficulty/unable (0), Quite a bit of difficulty (1), Moderate difficulty (2), Little difficulty (3), No difficulty (4) Survey date:    Any of your usual work, housework or school activities 0  2. Usual hobbies, recreational or sporting activities 0  3. Getting into/out of the bath 0  4. Walking between rooms 0  5. Putting on socks/shoes 0  6. Squatting  0  7. Lifting an object, like a bag of groceries  from the floor 0  8. Performing light activities around your home 0  9. Performing heavy activities around your home 0  10. Getting into/out of a car 0  11. Walking 2 blocks 0  12. Walking 1 mile 0  13. Going up/down 10 stairs (1 flight) 0  14. Standing for 1 hour 0  15.  sitting for 1 hour 0  16. Running on even ground 0  17. Running on uneven ground 0  18. Making sharp turns while running fast 0  19. Hopping  0  20. Rolling over in bed 0  Score total:  0     COGNITION: Overall cognitive status: Within functional limits for tasks assessed     SENSATION: Not tested  POSTURE: rounded shoulders, forward head, and flexed trunk   PALPATION: Tender to palpation along inguinal crease, quad and hip flexor musculature   LOWER EXTREMITY ROM:  Pain with FABER  LOWER EXTREMITY MMT:  MMT Right eval  Left eval  Hip flexion 4+ 3+  Hip extension    Hip abduction 4+ 3+  Hip adduction 4+ 3+  Hip internal rotation    Hip external rotation    Knee flexion 4+ 4  Knee extension 4+ 4  Ankle dorsiflexion 4+ 4  Ankle plantarflexion    Ankle inversion    Ankle eversion     (Blank rows = not tested)  LOWER EXTREMITY SPECIAL TESTS:  Hip special tests: Belvie (FABER) test: positive , Thomas test: Not tested, Hip scouring test: positive , and Piriformis test: positive     GAIT: Distance walked: 5 ft with SPC- flexed trunk, flexed knees, decreased step leng B  Assistive device utilized: Single point cane Level of assistance: Modified independence                                                                                                                                 TREATMENT DATE: 06/03/24   SELF CARE Patient instructed in plan of care, findings for evaluation, and ways of physical therapy may improve their function and quality of life.   TE- To improve strength, endurance, mobility, and function of specific targeted muscle groups or improve joint range of motion or improve muscle flexibility  Gentle abdominal activation, gentle piriformis stretch x 30 seconds on left Long arc gentle left hip distraction.  No change in pain noted.  PATIENT EDUCATION: Education details: POC Person educated: Patient Education method: Explanation Education comprehension: verbalized understanding   HOME EXERCISE PROGRAM: Establish visit 2    ASSESSMENT:  CLINICAL IMPRESSION: Patient is a 53 year old female presented physical therapy with significant pain in her left inguinal crease rating it into her iliopsoas and rectus femoris muscular region.  Due to severity of patient's pain physical therapist recommended patient be seen by orthopedic specialist instructed her she would either need a referral or she need to go to an urgent orthopedic care physician either at [facility] or at  the  [facility] and was provided with information regarding [facility] and [facility] options for these.  Instructed patient that if she did not show marked improvement within a few physical therapy sessions a physical therapist would continue with physical therapy and would send patient to Dr. For further evaluation.  Still hard to differentiate between hip joint injury or some kind of musculature/inguinal crease herniation injury.  Will continue with core strengthening, hip strengthening, and pain reduction activities neck session as patient is able.  OBJECTIVE IMPAIRMENTS: Abnormal gait, decreased activity tolerance, difficulty walking, decreased ROM, decreased strength, impaired perceived functional ability, and pain.   ACTIVITY LIMITATIONS: carrying, lifting, squatting, stairs, bed mobility, and locomotion level  PARTICIPATION LIMITATIONS: driving, community activity, and yard work  PERSONAL FACTORS: Age, Time since onset of injury/illness/exacerbation, and 3+ comorbidities: bursitis, anxiety, chronic lymphocytic leukemia, COPD, depression, and vitamin B12 deficiency are also affecting patient's functional outcome.   REHAB POTENTIAL: Good  CLINICAL DECISION MAKING: Evolving/moderate complexity  EVALUATION COMPLEXITY: Moderate   GOALS: Goals reviewed with patient? Yes  SHORT TERM GOALS: Target date: 06/17/2024      Patient will be independent in home exercise program to improve strength/mobility for better functional independence with ADLs. Baseline: No HEP currently  Goal status: INITIAL   LONG TERM GOALS: Target date: 07/15/2024    Patient will improve lower extremity functional scale by 25 points or greater in order to indicate statistically significant improvement in lower extremity function. Baseline: 0/80 Goal status: INITIAL  2.  Pt will improve L hip flexion and ABD strength to 4+/5 or greater to demonstrate improved L hip muscle function without pain.    Baseline: see eval chart  Goal status: INITIAL  3.  Pt will improve 5XSTS test by 5 sec or more to indicate improved LE strength and power  Baseline: test visit 2  Goal status: INITIAL   PLAN:  PT FREQUENCY: 1-2x/week  PT DURATION: 6 weeks  PLANNED INTERVENTIONS: 97750- Physical Performance Testing, 97110-Therapeutic exercises, 97530- Therapeutic activity, 97112- Neuromuscular re-education, 97535- Self Care, [id number]- Manual therapy, [phone]- Gait training, Patient/Family education, Balance training, Joint mobilization, Cryotherapy, and Moist heat  PLAN FOR NEXT SESSION: Trial heat and nustep for pain relief and LE strength, long arc distraction L hip, gluteal and core strength as indicate; provide HEP 5XSTS If no improvement in first few visits seek orthopaedic referral for further workup; already instructed pt she should seek orthopaedic referral at eval.    Lonni KATHEE Gainer, PT 06/03/2024, 11:31 AM

## 2024-06-03 ENCOUNTER — Other Ambulatory Visit: Payer: Self-pay

## 2024-06-03 ENCOUNTER — Ambulatory Visit: Attending: Family Medicine | Admitting: Physical Therapy

## 2024-06-03 DIAGNOSIS — R262 Difficulty in walking, not elsewhere classified: Secondary | ICD-10-CM | POA: Diagnosis present

## 2024-06-03 DIAGNOSIS — M79606 Pain in leg, unspecified: Secondary | ICD-10-CM

## 2024-06-03 DIAGNOSIS — R2681 Unsteadiness on feet: Secondary | ICD-10-CM | POA: Insufficient documentation

## 2024-06-03 DIAGNOSIS — M6281 Muscle weakness (generalized): Secondary | ICD-10-CM | POA: Insufficient documentation

## 2024-06-03 DIAGNOSIS — R269 Unspecified abnormalities of gait and mobility: Secondary | ICD-10-CM | POA: Diagnosis present

## 2024-06-03 DIAGNOSIS — R2689 Other abnormalities of gait and mobility: Secondary | ICD-10-CM | POA: Insufficient documentation

## 2024-06-03 DIAGNOSIS — M7918 Myalgia, other site: Secondary | ICD-10-CM | POA: Diagnosis not present

## 2024-06-04 ENCOUNTER — Other Ambulatory Visit: Payer: Self-pay | Admitting: Family Medicine

## 2024-06-04 DIAGNOSIS — G8929 Other chronic pain: Secondary | ICD-10-CM

## 2024-06-04 DIAGNOSIS — M79605 Pain in left leg: Secondary | ICD-10-CM

## 2024-06-07 ENCOUNTER — Ambulatory Visit
Admission: RE | Admit: 2024-06-07 | Discharge: 2024-06-07 | Disposition: A | Discharge status: Home / Self Care | Attending: Family Medicine | Admitting: Family Medicine

## 2024-06-07 ENCOUNTER — Ambulatory Visit
Admission: RE | Admit: 2024-06-07 | Discharge: 2024-06-07 | Disposition: A | Discharge status: Home / Self Care | Source: Ambulatory Visit | Attending: Family Medicine | Admitting: Family Medicine

## 2024-06-07 ENCOUNTER — Ambulatory Visit: Admitting: Physical Therapy

## 2024-06-07 DIAGNOSIS — G8929 Other chronic pain: Secondary | ICD-10-CM | POA: Insufficient documentation

## 2024-06-07 DIAGNOSIS — M79605 Pain in left leg: Secondary | ICD-10-CM | POA: Diagnosis not present

## 2024-06-07 DIAGNOSIS — R2689 Other abnormalities of gait and mobility: Secondary | ICD-10-CM

## 2024-06-07 DIAGNOSIS — M79604 Pain in right leg: Secondary | ICD-10-CM

## 2024-06-07 DIAGNOSIS — M6281 Muscle weakness (generalized): Secondary | ICD-10-CM

## 2024-06-07 DIAGNOSIS — M25551 Pain in right hip: Secondary | ICD-10-CM | POA: Insufficient documentation

## 2024-06-07 DIAGNOSIS — R262 Difficulty in walking, not elsewhere classified: Secondary | ICD-10-CM

## 2024-06-07 DIAGNOSIS — R269 Unspecified abnormalities of gait and mobility: Secondary | ICD-10-CM

## 2024-06-07 NOTE — Therapy (Signed)
 OUTPATIENT PHYSICAL THERAPY LOWER EXTREMITY TREATMENT   Patient Name: Monique [name] MRN: [id number] DOB:12-Jan-1971, 53 y.o., female Today's Date: 06/07/2024  END OF SESSION:  PT End of Session - 06/07/24 0904     Visit Number 2    Number of Visits 16    Date for Recertification  07/29/24    Progress Note Due on Visit 10    PT Start Time 0804    PT Stop Time 0842    PT Time Calculation (min) 38 min    Activity Tolerance Patient limited by pain    Behavior During Therapy [facility] for tasks assessed/performed           Past Medical History:  Diagnosis Date   Anxiety    Bursitis    leg pain   Carpal tunnel syndrome    Cervical dysplasia    hx LEEP over 18 years ago.    Chronic lymphocytic leukemia (HCC) 2018   Dr Jacobo.  (in lymph nodes)   COPD (chronic obstructive pulmonary disease) (HCC)    COVID-19 2021   Depression    Eating disorder    GERD (gastroesophageal reflux disease)    History of self-harm    Insomnia    Obsession    Tobacco abuse    Vitamin B12 deficiency (non anemic)    Past Surgical History:  Procedure Laterality Date   BREAST BIOPSY Right 01/05/2018   US  guided biopsy of 2 areas and 1 lymph node, MIXED INFLAMMATION AND GIANT CELL REACTION   CERVICAL BIOPSY  W/ LOOP ELECTRODE EXCISION     COLONOSCOPY WITH PROPOFOL  N/A 04/21/2019   Procedure: COLONOSCOPY WITH PROPOFOL ;  Surgeon: Janalyn Keene NOVAK, MD;  Location: [facility] ENDOSCOPY;  Service: Gastroenterology;  Laterality: N/A;   IR BONE MARROW BIOPSY & ASPIRATION  04/26/2024   OTHER SURGICAL HISTORY     scar tissue removed from vocal cords   TUBAL LIGATION     VAGINAL HYSTERECTOMY N/A 01/22/2021   Procedure: HYSTERECTOMY VAGINAL; BILATERAL SALPINGECTOMY;  Surgeon: Connell Davies, MD;  Location: [facility] ORS;  Service: Gynecology;  Laterality: N/A;   vocal cord surgery  2005   Patient Active Problem List   Diagnosis Date Noted   Stage 3a chronic kidney disease (HCC) 10/02/2023   Senile purpura (HCC)  10/02/2023   Major depression in remission (HCC) 10/02/2023   Pancytopenia (HCC) 10/02/2023   Post-menopause on HRT (hormone replacement therapy) 11/08/2022   At risk for sleep apnea 09/19/2021   Lumbar foraminal stenosis (L4-5) (Bilateral) (R>L) 06/05/2021   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 06/05/2021   Lumbosacral radiculopathy/radiculitis at L5 (Bilateral) 06/05/2021   PAD (peripheral artery disease) (HCC) 02/14/2021   CIN II (cervical intraepithelial neoplasia II) 02/01/2021   S/P vaginal hysterectomy 01/22/2021   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/09/2020   Bilateral leg pain 11/03/2019   Iron  deficiency anemia 10/15/2019   Bilateral arm pain 08/05/2019   Bilateral hand numbness 08/05/2019   Weakness of both hands 08/05/2019   Chronic musculoskeletal pain 07/19/2019   Chronic neuropathic pain 07/19/2019   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   Special screening for malignant neoplasms, colon    Polyp of sigmoid colon    Hemorrhoids    Trigeminal neuralgia 10/05/2018   Lumbar radiculitis (Right) 09/24/2018   Hypocalcemia 06/03/2018   Vitamin D  insufficiency 06/03/2018   Abnormal MRI, lumbar spine (06/02/2020) 06/03/2018   Chronic hip pain (Right) 06/03/2018   Spondylosis without myelopathy or radiculopathy, lumbar region 06/03/2018   DDD (degenerative  disc disease), lumbar 06/02/2018   Lumbar facet hypertrophy 06/02/2018   Lumbar facet arthropathy 06/02/2018   Lumbar facet syndrome (Bilateral) (R>L) 06/02/2018   Marijuana use 05/19/2018   Chronic lower extremity pain (1ry area of Pain) (Bilateral) (R>L) 05/13/2018   Chronic low back pain (2ry area of Pain) (Bilateral) (R>L) w/ sciatica (Bilateral) 05/13/2018   Chronic pain syndrome 05/13/2018   Disorder of skeletal system 05/13/2018   Pharmacologic therapy 05/13/2018   Problems influencing health status 05/13/2018   Mastalgia 05/05/2018   Breast mass, right 01/07/2018   Face pain  12/11/2017   Tingling 12/11/2017   Thoracic aortic atherosclerosis (HCC) 08/20/2017   Emphysema of lung (HCC) 08/20/2017   Adductor tendinitis 04/23/2017   Trochanteric bursitis of right hip 04/23/2017   GERD without esophagitis 12/11/2016   CLL (chronic lymphocytic leukemia) (HCC) 11/24/2016   Stress incontinence 09/04/2016   B12 deficiency 07/10/2015   Insomnia 07/10/2015   Anorexia nervosa, restricting type 07/10/2015   Anxiety, generalized 07/10/2015   H/O suicide attempt 07/10/2015   Lymphocytosis 07/10/2015   Dysplasia of cervix, low grade (CIN 1) 07/10/2015   Obsessive-compulsive disorder 07/10/2015   Tobacco use 07/10/2015   History of cervical dysplasia 07/18/2014    PCP: Glenard Mire, MD   REFERRING PROVIDER: Sowles, Krichna, MD   REFERRING DIAG: M79.18 (ICD-10-CM) - Pain of thigh muscle   THERAPY DIAG:  Abnormality of gait and mobility  Difficulty in walking, not elsewhere classified  Muscle weakness (generalized)  Other abnormalities of gait and mobility  Rationale for Evaluation and Treatment: Rehabilitation  ONSET DATE: 05/03/24  SUBJECTIVE:   SUBJECTIVE STATEMENT:  Today: Pt has continued pain in L hip, some new pain in R glute. Imaging scheduled for today.   From eval: Pt has significant discomfort in her left ant thigh in the inguinal crease. Pain radiates down the leg from there. Pt also has neuropathy on the R thigh. Pain on the left began about 1 month ago. She went to PCP, got some numbing cream. Pt is still considering options for seeing MD but is awaiting for PT recommendations. Pt does not recall when the pain began. She does know that rolling in bed causes a lot of pain.   PERTINENT HISTORY: Past medical history of bursitis, anxiety, chronic lymphocytic leukemia, COPD, depression, and vitamin B12 deficiency. PAIN:  Are you having pain? Yes: NPRS scale: 10/10 walking, no pain sitting, 10/10 rolling over in bed  Pain location: left ant hip  along inguinal crease  Pain description: sharp, radiating  Aggravating factors: walking, rolling over in bed  Relieving factors: sitting, rest   PRECAUTIONS: Fall  RED FLAGS: Known lymphocytic leukemia    WEIGHT BEARING RESTRICTIONS: No  FALLS:  Has patient fallen in last 6 months? No  LIVING ENVIRONMENT: Lives with: lives alone Lives in: House/apartment Stairs: Yes: External: 3 steps; bilateral but cannot reach both Has following equipment at home: Single point cane  OCCUPATION: n/a  PLOF: Independent  PATIENT GOALS: improve pain, mobility, and function   NEXT MD VISIT: Not scheduled for this problem yet.   OBJECTIVE:  Note: Objective measures were completed at Evaluation unless otherwise noted.  DIAGNOSTIC FINDINGS: no images yet   PATIENT SURVEYS:  LEFS  Extreme difficulty/unable (0), Quite a bit of difficulty (1), Moderate difficulty (2), Little difficulty (3), No difficulty (4) Survey date:    Any of your usual work, housework or school activities 0  2. Usual hobbies, recreational or sporting activities 0  3. Getting into/out of  the bath 0  4. Walking between rooms 0  5. Putting on socks/shoes 0  6. Squatting  0  7. Lifting an object, like a bag of groceries from the floor 0  8. Performing light activities around your home 0  9. Performing heavy activities around your home 0  10. Getting into/out of a car 0  11. Walking 2 blocks 0  12. Walking 1 mile 0  13. Going up/down 10 stairs (1 flight) 0  14. Standing for 1 hour 0  15.  sitting for 1 hour 0  16. Running on even ground 0  17. Running on uneven ground 0  18. Making sharp turns while running fast 0  19. Hopping  0  20. Rolling over in bed 0  Score total:  0     COGNITION: Overall cognitive status: Within functional limits for tasks assessed     SENSATION: Not tested  POSTURE: rounded shoulders, forward head, and flexed trunk   PALPATION: Tender to palpation along inguinal crease, quad and  hip flexor musculature   LOWER EXTREMITY ROM:  Pain with FABER  LOWER EXTREMITY MMT:  MMT Right eval Left eval  Hip flexion 4+ 3+  Hip extension    Hip abduction 4+ 3+  Hip adduction 4+ 3+  Hip internal rotation    Hip external rotation    Knee flexion 4+ 4  Knee extension 4+ 4  Ankle dorsiflexion 4+ 4  Ankle plantarflexion    Ankle inversion    Ankle eversion     (Blank rows = not tested)  LOWER EXTREMITY SPECIAL TESTS:  Hip special tests: Belvie (FABER) test: positive , Thomas test: Not tested, Hip scouring test: positive , and Piriformis test: positive     GAIT: Distance walked: 5 ft with SPC- flexed trunk, flexed knees, decreased step leng B  Assistive device utilized: Single point cane Level of assistance: Modified independence                                                                                                                                 TREATMENT DATE: 06/07/24   SELF CARE Patient instructed in plan of care, findings for evaluation, and ways of physical therapy may improve their function and quality of life.     TE- To improve strength, endurance, mobility, and function of specific targeted muscle groups or improve joint range of motion or improve muscle flexibility  Nustep with heat on gluteal region level 1 gentle AAROM x 6 min - no change in pain   To supine on mat table - heat applied to ant thigh along inguinal crease for pain modulation during all supine and hooklying activities below- pt reports it felt good.   Long arc gentle left hip distraction x 10 min   Manual: IASTM to ant quad on L x 8 min   TE- To improve strength, endurance, mobility, and function of specific targeted muscle groups or  improve joint range of motion or improve muscle flexibility  2 x 10 of TrA contraction for abdominal activation and core control   2 x 10 of glute sets for gluteal activation and hip stability     PATIENT EDUCATION: Education  details: POC Person educated: Patient Education method: Explanation Education comprehension: verbalized understanding   HOME EXERCISE PROGRAM: Access Code: [email or web address] URL: [name] Date: 06/07/2024 Prepared by: Lonni Gainer  Exercises - Supine Transversus Abdominis Bracing - Hands on Stomach  - 1 x daily - 7 x weekly - 2 sets - 10 reps - 3 sec hold - Supine Gluteal Sets  - 1 x daily - 7 x weekly - 2 sets - 10 reps - 3 sec hold   ASSESSMENT:  CLINICAL IMPRESSION:  From eval:  Patient is a 53 year old female presented physical therapy with significant pain in her left inguinal crease rating it into her iliopsoas and rectus femoris muscular region.  Due to severity of patient's pain physical therapist recommended patient be seen by orthopedic specialist instructed her she would either need a referral or she need to go to an urgent orthopedic care physician either at [facility] or at the [facility] clinic and was provided with information regarding  and [facility] options for these.  Instructed patient that if she did not show marked improvement within a few physical therapy sessions a physical therapist would not continue with physical therapy and would send patient to Dr. For further evaluation.  Still hard to differentiate between hip joint injury or some kind of musculature/inguinal crease herniation injury.  Will continue with core strengthening, hip strengthening, and pain reduction activities next session as patient is able.  Today: Patient arrived with good motivation for completion of pt activities.  Unclear of interventions improved pain as pt rides down in transport chair and pain is most noticeable in prolonged standing or activity. Instructed pt to monitor symptoms next few days for improvements. Pt has imaging of hips scheduled today. Instructed pt is no improvement in symptoms by end of next week appointments would refer her out  for further orthopedic work up. Pt will continue to benefit from skilled physical therapy intervention to address impairments, improve QOL, and attain therapy goals.    OBJECTIVE IMPAIRMENTS: Abnormal gait, decreased activity tolerance, difficulty walking, decreased ROM, decreased strength, impaired perceived functional ability, and pain.   ACTIVITY LIMITATIONS: carrying, lifting, squatting, stairs, bed mobility, and locomotion level  PARTICIPATION LIMITATIONS: driving, community activity, and yard work  PERSONAL FACTORS: Age, Time since onset of injury/illness/exacerbation, and 3+ comorbidities: bursitis, anxiety, chronic lymphocytic leukemia, COPD, depression, and vitamin B12 deficiency are also affecting patient's functional outcome.   REHAB POTENTIAL: Good  CLINICAL DECISION MAKING: Evolving/moderate complexity  EVALUATION COMPLEXITY: Moderate   GOALS: Goals reviewed with patient? Yes  SHORT TERM GOALS: Target date: 06/17/2024      Patient will be independent in home exercise program to improve strength/mobility for better functional independence with ADLs. Baseline: No HEP currently  Goal status: INITIAL   LONG TERM GOALS: Target date: 07/15/2024    Patient will improve lower extremity functional scale by 25 points or greater in order to indicate statistically significant improvement in lower extremity function. Baseline: 0/80 Goal status: INITIAL  2.  Pt will improve L hip flexion and ABD strength to 4+/5 or greater to demonstrate improved L hip muscle function without pain.   Baseline: see eval chart  Goal status: INITIAL  3.  Pt will improve 5XSTS test by 5  sec or more to indicate improved LE strength and power  Baseline: test visit 2  Goal status: INITIAL   PLAN:  PT FREQUENCY: 1-2x/week  PT DURATION: 6 weeks  PLANNED INTERVENTIONS: 97750- Physical Performance Testing, 97110-Therapeutic exercises, 97530- Therapeutic activity, 97112- Neuromuscular  re-education, 97535- Self Care, [id number]- Manual therapy, [phone]- Gait training, Patient/Family education, Balance training, Joint mobilization, Cryotherapy, and Moist heat  PLAN FOR NEXT SESSION: Trial heat and nustep for pain relief and LE strength, long arc distraction L hip, gluteal and core strength as indicate; provide HEP 5XSTS If no improvement in first few visits seek orthopaedic referral for further workup; already instructed pt she should seek orthopaedic referral at eval.    Lonni KATHEE Gainer, PT 06/07/2024, 9:05 AM

## 2024-06-08 ENCOUNTER — Encounter: Payer: Self-pay | Admitting: Oncology

## 2024-06-08 ENCOUNTER — Other Ambulatory Visit: Payer: Self-pay

## 2024-06-08 ENCOUNTER — Other Ambulatory Visit (HOSPITAL_COMMUNITY): Payer: Self-pay

## 2024-06-09 ENCOUNTER — Ambulatory Visit (INDEPENDENT_AMBULATORY_CARE_PROVIDER_SITE_OTHER): Admitting: Physical Therapy

## 2024-06-09 ENCOUNTER — Other Ambulatory Visit: Payer: Self-pay

## 2024-06-09 DIAGNOSIS — R269 Unspecified abnormalities of gait and mobility: Secondary | ICD-10-CM | POA: Diagnosis not present

## 2024-06-09 DIAGNOSIS — R262 Difficulty in walking, not elsewhere classified: Secondary | ICD-10-CM

## 2024-06-09 DIAGNOSIS — M6281 Muscle weakness (generalized): Secondary | ICD-10-CM

## 2024-06-09 DIAGNOSIS — R2689 Other abnormalities of gait and mobility: Secondary | ICD-10-CM | POA: Diagnosis not present

## 2024-06-09 DIAGNOSIS — R2681 Unsteadiness on feet: Secondary | ICD-10-CM

## 2024-06-09 NOTE — Therapy (Signed)
 OUTPATIENT PHYSICAL THERAPY LOWER EXTREMITY TREATMENT   Patient Name: Monique [name] MRN: [id number] DOB:1971/08/18, 53 y.o., female Today's Date: 06/09/2024  END OF SESSION:  PT End of Session - 06/09/24 0819     Visit Number 3    Number of Visits 16    Date for Recertification  07/29/24    Progress Note Due on Visit 10    PT Start Time 0805    PT Stop Time 0845    PT Time Calculation (min) 40 min    Activity Tolerance Patient limited by pain    Behavior During Therapy [facility] for tasks assessed/performed            Past Medical History:  Diagnosis Date   Anxiety    Bursitis    leg pain   Carpal tunnel syndrome    Cervical dysplasia    hx LEEP over 18 years ago.    Chronic lymphocytic leukemia (HCC) 2018   Dr Jacobo.  (in lymph nodes)   COPD (chronic obstructive pulmonary disease) (HCC)    COVID-19 2021   Depression    Eating disorder    GERD (gastroesophageal reflux disease)    History of self-harm    Insomnia    Obsession    Tobacco abuse    Vitamin B12 deficiency (non anemic)    Past Surgical History:  Procedure Laterality Date   BREAST BIOPSY Right 01/05/2018   US  guided biopsy of 2 areas and 1 lymph node, MIXED INFLAMMATION AND GIANT CELL REACTION   CERVICAL BIOPSY  W/ LOOP ELECTRODE EXCISION     COLONOSCOPY WITH PROPOFOL  N/A 04/21/2019   Procedure: COLONOSCOPY WITH PROPOFOL ;  Surgeon: Janalyn Keene NOVAK, MD;  Location: [facility] ENDOSCOPY;  Service: Gastroenterology;  Laterality: N/A;   IR BONE MARROW BIOPSY & ASPIRATION  04/26/2024   OTHER SURGICAL HISTORY     scar tissue removed from vocal cords   TUBAL LIGATION     VAGINAL HYSTERECTOMY N/A 01/22/2021   Procedure: HYSTERECTOMY VAGINAL; BILATERAL SALPINGECTOMY;  Surgeon: Connell Davies, MD;  Location: [facility] ORS;  Service: Gynecology;  Laterality: N/A;   vocal cord surgery  2005   Patient Active Problem List   Diagnosis Date Noted   Stage 3a chronic kidney disease (HCC) 10/02/2023   Senile purpura  10/02/2023   Major depression in remission 10/02/2023   Pancytopenia (HCC) 10/02/2023   Post-menopause on HRT (hormone replacement therapy) 11/08/2022   At risk for sleep apnea 09/19/2021   Lumbar foraminal stenosis (L4-5) (Bilateral) (R>L) 06/05/2021   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 06/05/2021   Lumbosacral radiculopathy/radiculitis at L5 (Bilateral) 06/05/2021   PAD (peripheral artery disease) 02/14/2021   CIN II (cervical intraepithelial neoplasia II) 02/01/2021   S/P vaginal hysterectomy 01/22/2021   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/09/2020   Bilateral leg pain 11/03/2019   Iron  deficiency anemia 10/15/2019   Bilateral arm pain 08/05/2019   Bilateral hand numbness 08/05/2019   Weakness of both hands 08/05/2019   Chronic musculoskeletal pain 07/19/2019   Chronic neuropathic pain 07/19/2019   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   Special screening for malignant neoplasms, colon    Polyp of sigmoid colon    Hemorrhoids    Trigeminal neuralgia 10/05/2018   Lumbar radiculitis (Right) 09/24/2018   Hypocalcemia 06/03/2018   Vitamin D  insufficiency 06/03/2018   Abnormal MRI, lumbar spine (06/02/2020) 06/03/2018   Chronic hip pain (Right) 06/03/2018   Spondylosis without myelopathy or radiculopathy, lumbar region 06/03/2018   DDD (degenerative disc disease),  lumbar 06/02/2018   Lumbar facet hypertrophy 06/02/2018   Lumbar facet arthropathy 06/02/2018   Lumbar facet syndrome (Bilateral) (R>L) 06/02/2018   Marijuana use 05/19/2018   Chronic lower extremity pain (1ry area of Pain) (Bilateral) (R>L) 05/13/2018   Chronic low back pain (2ry area of Pain) (Bilateral) (R>L) w/ sciatica (Bilateral) 05/13/2018   Chronic pain syndrome 05/13/2018   Disorder of skeletal system 05/13/2018   Pharmacologic therapy 05/13/2018   Problems influencing health status 05/13/2018   Mastalgia 05/05/2018   Breast mass, right 01/07/2018   Face pain 12/11/2017    Tingling 12/11/2017   Thoracic aortic atherosclerosis 08/20/2017   Emphysema of lung (HCC) 08/20/2017   Adductor tendinitis 04/23/2017   Trochanteric bursitis of right hip 04/23/2017   GERD without esophagitis 12/11/2016   CLL (chronic lymphocytic leukemia) (HCC) 11/24/2016   Stress incontinence 09/04/2016   B12 deficiency 07/10/2015   Insomnia 07/10/2015   Anorexia nervosa, restricting type 07/10/2015   Anxiety, generalized 07/10/2015   H/O suicide attempt 07/10/2015   Lymphocytosis 07/10/2015   Dysplasia of cervix, low grade (CIN 1) 07/10/2015   Obsessive-compulsive disorder 07/10/2015   Tobacco use 07/10/2015   History of cervical dysplasia 07/18/2014    PCP: Glenard Mire, MD   REFERRING PROVIDER: Sowles, Krichna, MD   REFERRING DIAG: M79.18 (ICD-10-CM) - Pain of thigh muscle   THERAPY DIAG:  Abnormality of gait and mobility  Difficulty in walking, not elsewhere classified  Muscle weakness (generalized)  Other abnormalities of gait and mobility  Unsteadiness on feet  Rationale for Evaluation and Treatment: Rehabilitation  ONSET DATE: 05/03/24  SUBJECTIVE:   SUBJECTIVE STATEMENT:  Today: Pt has continued pain in L hip, continued pain and numbness today States that her R leg is feeling Numb on this day. At rest. Pain is 3/10. But reported that when getting up this AM pain was 10/10 with walking in the L hip/ but states that pain is shifting depending on positioning in BLE.    From eval: Pt has significant discomfort in her left ant thigh in the inguinal crease. Pain radiates down the leg from there. Pt also has neuropathy on the R thigh. Pain on the left began about 1 month ago. She went to PCP, got some numbing cream. Pt is still considering options for seeing MD but is awaiting for PT recommendations. Pt does not recall when the pain began. She does know that rolling in bed causes a lot of pain.   PERTINENT HISTORY: Past medical history of bursitis, anxiety,  chronic lymphocytic leukemia, COPD, depression, and vitamin B12 deficiency. PAIN:  Are you having pain? Yes: NPRS scale: 10/10 walking, no pain sitting, 10/10 rolling over in bed  Pain location: left ant hip along inguinal crease  Pain description: sharp, radiating  Aggravating factors: walking, rolling over in bed  Relieving factors: sitting, rest   PRECAUTIONS: Fall  RED FLAGS: Known lymphocytic leukemia    WEIGHT BEARING RESTRICTIONS: No  FALLS:  Has patient fallen in last 6 months? No  LIVING ENVIRONMENT: Lives with: lives alone Lives in: House/apartment Stairs: Yes: External: 3 steps; bilateral but cannot reach both Has following equipment at home: Single point cane  OCCUPATION: n/a  PLOF: Independent  PATIENT GOALS: improve pain, mobility, and function   NEXT MD VISIT: Not scheduled for this problem yet.   OBJECTIVE:  Note: Objective measures were completed at Evaluation unless otherwise noted.  DIAGNOSTIC FINDINGS: no images yet   PATIENT SURVEYS:   LEFS  Extreme difficulty/unable (0), Quite a  bit of difficulty (1), Moderate difficulty (2), Little difficulty (3), No difficulty (4) Survey date:    Any of your usual work, housework or school activities 0  2. Usual hobbies, recreational or sporting activities 0  3. Getting into/out of the bath 0  4. Walking between rooms 0  5. Putting on socks/shoes 0  6. Squatting  0  7. Lifting an object, like a bag of groceries from the floor 0  8. Performing light activities around your home 0  9. Performing heavy activities around your home 0  10. Getting into/out of a car 0  11. Walking 2 blocks 0  12. Walking 1 mile 0  13. Going up/down 10 stairs (1 flight) 0  14. Standing for 1 hour 0  15.  sitting for 1 hour 0  16. Running on even ground 0  17. Running on uneven ground 0  18. Making sharp turns while running fast 0  19. Hopping  0  20. Rolling over in bed 0  Score total:  0     COGNITION: Overall  cognitive status: Within functional limits for tasks assessed     SENSATION: Not tested  POSTURE: rounded shoulders, forward head, and flexed trunk   PALPATION: Tender to palpation along inguinal crease, quad and hip flexor musculature   LOWER EXTREMITY ROM:  Pain with FABER  LOWER EXTREMITY MMT:  MMT Right eval Left eval  Hip flexion 4+ 3+  Hip extension    Hip abduction 4+ 3+  Hip adduction 4+ 3+  Hip internal rotation    Hip external rotation    Knee flexion 4+ 4  Knee extension 4+ 4  Ankle dorsiflexion 4+ 4  Ankle plantarflexion    Ankle inversion    Ankle eversion     (Blank rows = not tested)  LOWER EXTREMITY SPECIAL TESTS:  Hip special tests: Belvie (FABER) test: positive , Thomas test: Not tested, Hip scouring test: positive , and Piriformis test: positive     GAIT: Distance walked: 5 ft with SPC- flexed trunk, flexed knees, decreased step leng B  Assistive device utilized: Single point cane Level of assistance: Modified independence                                                                                                                                 TREATMENT DATE: 06/09/24    TE- To improve strength, endurance, mobility, and function of specific targeted muscle groups or improve joint range of motion or improve muscle flexibility  Sit<>supine without assist from PT bur required assist from UE to bring LLE into bed.  heat applied to ant thigh along inguinal crease for pain modulation during all supine and hooklying activities below- pt reports it felt good.    SAQ x 12  Ankle pumps x 15.  Hip fall outs in hookling  hip flexion in hook lying AAROM x 5 bil with sharp pain in LLE.  Long arc gentle BLE  hip distraction 3 x 1 min bil    Manual: STM with TP release to TFL and quadriceps as well as IASTM to ant quad on L x 12 min    PATIENT EDUCATION: Education details: POC Person educated: Patient Education method:  Explanation Education comprehension: verbalized understanding   HOME EXERCISE PROGRAM: Access Code: [email or web address] URL: [email or web address] Date: 06/07/2024 Prepared by: Lonni Gainer  Exercises - Supine Transversus Abdominis Bracing - Hands on Stomach  - 1 x daily - 7 x weekly - 2 sets - 10 reps - 3 sec hold - Supine Gluteal Sets  - 1 x daily - 7 x weekly - 2 sets - 10 reps - 3 sec hold   ASSESSMENT:  CLINICAL IMPRESSION:  From eval:  Patient is a 53 year old female presented physical therapy with significant pain in her left inguinal crease rating it into her iliopsoas and rectus femoris muscular region.  Due to severity of patient's pain physical therapist recommended patient be seen by orthopedic specialist instructed her she would either need a referral or she need to go to an urgent orthopedic care physician either at [facility] or at the [facility] clinic and was provided with information regarding [facility] options for these.  Instructed patient that if she did not show marked improvement within a few physical therapy sessions a physical therapist would not continue with physical therapy and would send patient to Dr. For further evaluation.  Still hard to differentiate between hip joint injury or some kind of musculature/inguinal crease herniation injury.  Will continue with core strengthening, hip strengthening, and pain reduction activities next session as patient is able.  Today: Patient arrived with good motivation for completion of pt activities.  Unclear of interventions improved pain as pt rides down in transport chair and pain is most noticeable in prolonged standing or activity. Instructed pt to monitor symptoms next few days for improvements. Pt has imaging of hips scheduled today. Instructed pt is no improvement in symptoms by end of next week appointments would refer her out for further orthopedic work up. Pt will continue to  benefit from skilled physical therapy intervention to address impairments, improve QOL, and attain therapy goals.    OBJECTIVE IMPAIRMENTS: Abnormal gait, decreased activity tolerance, difficulty walking, decreased ROM, decreased strength, impaired perceived functional ability, and pain.   ACTIVITY LIMITATIONS: carrying, lifting, squatting, stairs, bed mobility, and locomotion level  PARTICIPATION LIMITATIONS: driving, community activity, and yard work  PERSONAL FACTORS: Age, Time since onset of injury/illness/exacerbation, and 3+ comorbidities: bursitis, anxiety, chronic lymphocytic leukemia, COPD, depression, and vitamin B12 deficiency are also affecting patient's functional outcome.   REHAB POTENTIAL: Good  CLINICAL DECISION MAKING: Evolving/moderate complexity  EVALUATION COMPLEXITY: Moderate   GOALS: Goals reviewed with patient? Yes  SHORT TERM GOALS: Target date: 06/17/2024      Patient will be independent in home exercise program to improve strength/mobility for better functional independence with ADLs. Baseline: No HEP currently  Goal status: INITIAL   LONG TERM GOALS: Target date: 07/15/2024    Patient will improve lower extremity functional scale by 25 points or greater in order to indicate statistically significant improvement in lower extremity function. Baseline: 0/80 Goal status: INITIAL  2.  Pt will improve L hip flexion and ABD strength to 4+/5 or greater to demonstrate improved L hip muscle function without pain.   Baseline: see eval chart  Goal status: INITIAL  3.  Pt will improve 5XSTS test by 5 sec  or more to indicate improved LE strength and power  Baseline: test visit 2  Goal status: INITIAL   PLAN:  PT FREQUENCY: 1-2x/week  PT DURATION: 6 weeks  PLANNED INTERVENTIONS: 97750- Physical Performance Testing, 97110-Therapeutic exercises, 97530- Therapeutic activity, 97112- Neuromuscular re-education, 97535- Self Care, [id number]- Manual therapy,  [phone]- Gait training, Patient/Family education, Balance training, Joint mobilization, Cryotherapy, and Moist heat  PLAN FOR NEXT SESSION:   Trial heat and nustep for pain relief and LE strength, long arc distraction L hip, gluteal and core strength as indicate; provide HEP  If no improvement in first few visits seek orthopaedic referral for further workup; already instructed pt she should seek orthopaedic referral at eval.    Massie FORBES Dollar, PT 06/09/2024, 8:20 AM

## 2024-06-11 ENCOUNTER — Other Ambulatory Visit: Payer: Self-pay | Admitting: Family Medicine

## 2024-06-11 ENCOUNTER — Ambulatory Visit: Payer: Self-pay | Admitting: Family Medicine

## 2024-06-11 NOTE — Telephone Encounter (Signed)
 Copied from CRM #[id number]. Topic: Clinical - Medication Refill >> Jun 11, 2024  9:56 AM Delon HERO wrote: Medication: Rx #: [id number]  atorvastatin  (LIPITOR) 10 MG tablet [[id number]]    Has the patient contacted their pharmacy? Yes (Agent: If no, request that the patient contact the pharmacy for the refill. If patient does not wish to contact the pharmacy document the reason why and proceed with request.) (Agent: If yes, when and what did the pharmacy advise?)  This is the patient's preferred pharmacy:    [place] [facility] [address] [place] [address] Phone: [phone] Fax: [phone]   Is this the correct pharmacy for this prescription? Yes If no, delete pharmacy and type the correct one.   Has the prescription been filled recently? Yes  Is the patient out of the medication? Yes  Has the patient been seen for an appointment in the last year OR does the patient have an upcoming appointment? Yes  Can we respond through MyChart? Yes  Agent: Please be advised that Rx refills may take up to 3 business days. We ask that you follow-up with your pharmacy.

## 2024-06-13 DIAGNOSIS — R0600 Dyspnea, unspecified: Secondary | ICD-10-CM | POA: Diagnosis not present

## 2024-06-13 DIAGNOSIS — R0902 Hypoxemia: Secondary | ICD-10-CM | POA: Diagnosis not present

## 2024-06-13 DIAGNOSIS — R918 Other nonspecific abnormal finding of lung field: Secondary | ICD-10-CM | POA: Diagnosis not present

## 2024-06-13 DIAGNOSIS — N183 Chronic kidney disease, stage 3 unspecified: Secondary | ICD-10-CM | POA: Diagnosis not present

## 2024-06-13 DIAGNOSIS — D631 Anemia in chronic kidney disease: Secondary | ICD-10-CM | POA: Diagnosis not present

## 2024-06-13 DIAGNOSIS — R0602 Shortness of breath: Secondary | ICD-10-CM | POA: Diagnosis not present

## 2024-06-13 DIAGNOSIS — J90 Pleural effusion, not elsewhere classified: Secondary | ICD-10-CM | POA: Diagnosis not present

## 2024-06-13 DIAGNOSIS — J9809 Other diseases of bronchus, not elsewhere classified: Secondary | ICD-10-CM | POA: Diagnosis not present

## 2024-06-13 DIAGNOSIS — J9811 Atelectasis: Secondary | ICD-10-CM | POA: Diagnosis not present

## 2024-06-13 NOTE — H&P (Addendum)
 ------------------------------------------------------------------------------- Attestation signed by Monique Monique Opal, MD at 06/17/24 1337 I saw and evaluated the patient, participating in the key portions of the service.  I reviewed the resident's note.  I agree with the resident's findings and plan.    53 y.o. female with refractory CLL/SLL (17p deletion) despite multiple therapies including rituximab , bendamustine , ibrutinib , venetoclax , and lenalidomide . Comorbidities include CKD III, COPD, PAD, atherosclerosis, and depression with prior suicide attempt. She presented with acute hypoxic respiratory failure due to a large right malignant pleural effusion, with worsening dyspnea and tachypnea. Imaging showed significant effusion, diffuse lymphadenopathy, and esophageal narrowing without dysphagia. Plan for chest tube placement. Of note, a prior thoracentesis on 8/26 at an outside hospital removed 1.5 L. Labs here show anemia (Hgb 7.5), thrombocytopenia (plt 34). Recent bone marrow biopsy (04/26/24) revealed hypercellular marrow with 95% CLL involvement. Patient was referred for CAR-T therapy and pirtobrutinib, but insurance-related barriers have delayed initiation. Now established at [facility] with Dr. Sonny Lever and will require follow-up there for ongoing care, as [facility] outpatient services are out-of-network for the patient's insurance.   Monique LILLETTE Russella, MD -------------------------------------------------------------------------------  Hematology/Oncology   Attending Physician :  Monique Naaman Fleeta Jacqulin, MD Accepting Service  : Oncology/Hematology (MDE) Reason for Admission: Dyspnea, AHRF  Problem List: Problem List[1]   Assessment/Plan: Monique [name] is an 53 y.o. female with a history of CLL, CKD stage III, anxiety, depression, PAD, atherosclerosis, trigeminal neuralgia, IDA, right pleural effusion, previous suicide attempt, and marijuana who presented to [facility] ED for dyspnea  secondary to pleural effusion.   Dyspnea  Acute hypoxic respiratory failure  R malignant pleural effusion Presented with dyspnea, tachypnea. Oxygen saturations 90-92% at rest, but 87% on minimal exertion per chart review. Currently on 2 L [place]. CT with finding of large right pleural effusion, moderate narrowing of R upper and lower lobe bronchi, as well as diffuse significant lymphadenopathy. No PE. Other work up includes pro-BNP 357, She does have history of R pleural effusion with thoracentesis performed last 8/26 (1.5 L removed). Cytology report notable for lymphocyte predominant effusion consistent with involvement by known CLL/SLL based on flow cytometry. - Interventional pulm consulted for thoracentesis - Wean oxygen as able - May required - Oxygen saturation goal 88-92%  CLL History of CLL with progressive disease despite multiple lines of therapy, including Rituxan  plus Treanda , Imbruvica , and venetoclax  (discontinued due to intolerance - transaminitis). She received additional Rituxan  (last 05/07/23) and XRT to the left axilla, followed by intermittent Revlimid . Recent bone marrow biopsy on April 26, 2024 showed hypercellular marrow with 95% CLL involvement and highly complex cytogenetics, including 17p deletion. PET scan on May 04, 2024 demonstrated a mixed response. Due to resistance to recent therapies, Revlimid  was discontinued, and she was referred to [facility] for possible CAR-T therapy; pirtobrutinib is also being considered. Currently following at [facility]. She has been trying to get set up at [facility] or [facility] without success due to these facilities being out of network.  - Daily CBC diff   Esophageal thickening CT with finding of large right pleural effusion as well as diffuse significant lymphadenopathy with significant narrowing of the distal esophagus by thick circumferential soft tissue like related to lymphadenopathy. No dysphagia, regurgitation of food.  - Continue to  monitor  Pancytopenia  Iron  deficiency anemia  Chronic cytopenias secondary to CLL. She also has history of IDA. On admission, hemoglobin 7.5, plt 34, WBC 3.9. Receives transfusions in outpatient oncology clinic.  - Daily CBC diff  -  Transfuse 1 unit of pRBCs for hgb >7 - Transfuse 1 unit plt for plts >10K   Left Hip Strain Notes left hip strain approximately 1 month ago, cannot recall inciting event. Had xrays of hip that were negative for fracture. Denies recent falls. Has been working with PT for the last 2 weeks. - PT/OT - Tylenol  PRN  Tobacco Use Disorder Smoking 1 ppd.  - NRT   COPD Continue formulary equivalent of home inhalers.  - Continue home Anoro ellipta  daily  - Albuterol  prn  - SpO2 goal 88-92%   CKD Creatinine 1.16 on admission which appears to be baseline. Euvolemic. - Daily BMP  HLD: Continue atorvastatin  formulary equivalent   Mood disorder: Continue abilify  5 mg daily   GERD: Continue omeprazole  equivalent 20 mg daily   Neuropathy: Continue home lyrica  150 mg TID, Tizanidine  2mg  daily  Restless Leg Syndrome: Continue Nortriptyline  20mg  daily  Allergic rhinitis: Continue home Zyrtec  10 mg daily  Cancer related fatigue:  Patient endorses fatigue with onset of cancer symptoms or treatment.    Cancer related Pain:  Patient endorses pain associated with Leukemia.   -Tylenol  PRN    HPI: Monique [name] is an 53 y.o. female who initially presented to [facility] with dyspnea on exertion, with concerns for a recurrent right-sided malignant pleural effusion, and was transferred to [facility] to be admitted to a primary oncology service.  She notes over the last week she has been increasingly short of breath. On Sunday she walked a short distance from her living room to the bathroom, and became very dyspneic, prompting her to come to the hospital. She denies associated chest pain, palpitations, lightheadedness, dizziness, nausea/vomiting. She previously had a  right sided malignant pleural effusion s/p thoracentesis on 8/26, with drainage of 1.5L at that time.  For her CLL, she was following with Dr. Jacobo at [facility]. She currently does not have a primary oncologist, and has been referred to [facility], however has run into insurance issues. She has been treated with multiple therapies including Rituxan  plus Treanda , Imbruvica , and venetoclax  (discontinued due to intolerance - transaminitis). She received additional Rituxan  (last 05/07/23) and XRT to the left axilla, followed by intermittent Revlimid . Recent bone marrow biopsy on April 26, 2024 showed hypercellular marrow with 95% CLL involvement and highly complex cytogenetics, including 17p deletion. PET scan on May 04, 2024 demonstrated a mixed response. Due to resistance to recent therapies, Revlimid  was discontinued, and she was referred to [facility] for possible CAR-T therapy; pirtobrutinib is also being considered, however has run into insurance issues.  On arrival to the ED, she was tachypneic and required 2L O2 [place]. Her labs were notable for anemia Hgb 7.5, thrombocytopenia plt 34, and ANC 1.4, elevated creatinine 1.25, elevated BNP 357. CXR was notable for a moderate right pleural effusion and right basilar opacities representing atelectasis and/or airspace disease. CTA chest was negative for PE, however notable for  large right pleural effusion with adjacent atelectasis with moderate narrowing of the right upper and lower lobe bronchi and bronchus intermedius secondary to mass effect.    Review of Systems: All positive and pertinent negatives are noted in the HPI; otherwise all other systems are negative  Oncologic History:  Primary Oncologist: Dr. Jacobo at [facility] Hematology/Oncology History   No problem history exists.    Medical History: PCP: Glenard Dorette Orem, MD Past Medical History[2] Surgical History: Past Surgical History[3]  Social History: Social  History [4]  Family History:  family history is not on file.   Allergies: is allergic to doxycycline .  Medications:  Meds:Scheduled Medications[5] Continuous Infusions:Infusions Meds[6] PRN Meds:.PRN Medications[7]  Objective:  Vitals: Temp:  [36.4 C (97.5 F)-36.6 C (97.9 F)] 36.6 C (97.9 F) Pulse:  [90-94] 92 SpO2 Pulse:  [89-100] 99 Resp:  [20-34] 27 BP: (93-117)/(52-79) 115/71 MAP (mmHg):  [62-83] 82 SpO2:  [97 %-100 %] 97 % BMI (Calculated):  [25.52] 25.52  Physical Exam: General: Resting, in no apparent distress, lying in bed HEENT:  PERRL. No scleral icterus or conjunctival injection. MMM without ulceration, erythema or exudate. No cervical or axillary lymphadenopathy.  Heart:  RRR. S1, S2. No murmurs, gallops, or rubs. Lungs:  Breathing is unlabored, and patient is speaking full sentences with ease.  No stridor.  CTAB. No rales, ronchi, or crackles.   Abdomen:  No distention or pain on palpation.  Bowel sounds are present and normoactive x 4.  No palpable hepatomegaly or splenomegaly.  No palpable masses. Skin:  No rashes, petechiae or purpura.  No areas of skin breakdown. Warm to touch, dry, smooth, and even. Musculoskeletal:  No grossly-evident joint effusions or deformities.  Range of motion about the shoulder, elbow, hips and knees is grossly normal. Psychiatric:  Range of affect is appropriate.   Neurologic:  Alert and oriented to person, place, time and situation.  Gait is normal.  No Nystagmus Cerebellar tasks (finger-to-nose, rapid hand movement, heel along shin) are completed with ease and are symmetric. CNII-CNXII grossly intact. Extremities:  Appear well-perfused. No clubbing, edema, or cyanosis. CVAD: R CW Port - no erythema, nontender; dressing CDI.   Test Results Recent Labs    06/13/24 1429 06/14/24 1625  WBC 4.9 3.9  NEUTROABS 1.4* 1.0*  HGB 7.5* 7.5*  PLT 34* 34*   Recent Labs    06/13/24 1429 06/14/24 1625  NA 143 144  K 3.8 4.0  CL  106 106  CO2 25.7 28.0  BUN 14 16  CREATININE 1.25* 1.16*  MG  --  2.1  CALCIUM  9.3 9.6    Imaging: Radiology studies were personally reviewed  DVT PPX Indicated: yes, held for pancytopenia iso leukemia FEN: Discharge Plan: - fluids: no - electrolytes: stable  - diet: regular   Need for PT: yes Anticipated Discharge: Their home  Code Status: @PATFPLSTATCODE @  Full Code   Time spent on counseling/coordination of care: 30 Minutes Total time spent with patient: 30 Minutes  I personally spent 60 minutes face-to-face and non-face-to-face in the care of this patient, which includes all pre, intra, and post visit time on the date of service.  All documented time was specific to the E/M visit and does not include any procedures that may have been performed.      Monique Kapoor, MD Internal Medicine PGY1       [1] There is no problem list on file for this patient.  [2] No past medical history on file. [3] No past surgical history on file. [4] Social History Socioeconomic History  . Marital status: Divorced  . Number of children: 4  . Years of education: 67  . Highest education level: High school graduate  Tobacco Use  . Smoking status: Every Day    Types: Cigarettes  . Smokeless tobacco: Never  Vaping Use  . Vaping status: Former  . Substances: Nicotine   . Devices: Disposable  Substance and Sexual Activity  . Alcohol use: Not Currently  . Drug use: Yes    Types: Marijuana  . Sexual activity:  Yes    Partners: Male    Birth control/protection: Surgical   Social Drivers of Health   Financial Resource Strain: Low Risk  (02/05/2024)   Received from [facility] [place]   Overall Financial Resource Strain (CARDIA)   . Difficulty of Paying Living Expenses: Not very hard  Food Insecurity: No Food Insecurity (02/05/2024)   Received from [facility] [place]   Hunger Vital Sign   . Within the past 12 months, you  worried that your food would run out before you got the money to buy more.: Never true   . Within the past 12 months, the food you bought just didn't last and you didn't have money to get more.: Never true  Transportation Needs: No Transportation Needs (02/05/2024)   Received from [facility] [place]   [facility] - Transportation   . Lack of Transportation (Medical): No   . Lack of Transportation (Non-Medical): No  Physical Activity: Inactive (02/05/2024)   Received from [facility]   Exercise Vital Sign   . On average, how many days per week do you engage in moderate to strenuous exercise (like a brisk walk)?: 0 days   . On average, how many minutes do you engage in exercise at this level?: 0 min  Stress: No Stress Concern Present (02/05/2024)   Received from [facility] - Occupational Stress Questionnaire   . Feeling of Stress : Not at all  Social Connections: Socially Isolated (02/05/2024)   Received from [facility]   Social Connection and Isolation Panel   . In a typical week, how many times do you talk on the phone with family, friends, or neighbors?: More than three times a week   . How often do you get together with friends or relatives?: Three times a week   . How often do you attend church or religious services?: Never   . Do you belong to any clubs or organizations such as church groups, unions, fraternal or athletic groups, or school groups?: No   . How often do you attend meetings of the clubs or organizations you belong to?: Never   . Are you married, widowed, divorced, separated, never married, or living with a partner?: Divorced  Housing: Low Risk  (02/05/2024)   Received from [facility]   Housing Stability Vital Sign   . In the last 12 months, was there a time when you were not able to pay the mortgage or rent on time?: No   . In the past 12 months, how many times have you moved where you were  living?: 0   . At any time in the past 12 months, were you homeless or living in a shelter (including now)?: No  [5] . [START ON 06/15/2024] aripiprazole   5 mg Oral Daily  . [START ON 06/15/2024] atorvastatin   10 mg Oral Daily  . [START ON 06/15/2024] cetirizine   5 mg Oral Daily  . [START ON 06/15/2024] estradiol   0.5 mg Oral Daily  . [START ON 06/15/2024] nortriptyline   20 mg Oral Once  . [START ON 06/15/2024] pantoprazole   20 mg Oral Daily before breakfast  . [START ON 06/15/2024] pregabalin   150 mg Oral Once  . sodium chloride   10 mL Intravenous BID  . sodium chloride   10 mL Intravenous BID  . sodium chloride   10 mL Intravenous BID  . [START ON 06/15/2024] tizanidine   2 mg Oral daily  . [  START ON 06/15/2024] umeclidinium-vilanterol  1 puff Inhalation Daily (RT)  [6] [7] acetaminophen , emollient combination no.92, loperamide, loperamide

## 2024-06-14 DIAGNOSIS — C9110 Chronic lymphocytic leukemia of B-cell type not having achieved remission: Secondary | ICD-10-CM | POA: Diagnosis not present

## 2024-06-14 DIAGNOSIS — Z9889 Other specified postprocedural states: Secondary | ICD-10-CM | POA: Diagnosis not present

## 2024-06-14 DIAGNOSIS — D61818 Other pancytopenia: Secondary | ICD-10-CM | POA: Diagnosis not present

## 2024-06-14 DIAGNOSIS — J9601 Acute respiratory failure with hypoxia: Secondary | ICD-10-CM | POA: Diagnosis not present

## 2024-06-14 DIAGNOSIS — G893 Neoplasm related pain (acute) (chronic): Secondary | ICD-10-CM | POA: Diagnosis not present

## 2024-06-14 DIAGNOSIS — J910 Malignant pleural effusion: Secondary | ICD-10-CM | POA: Diagnosis not present

## 2024-06-14 DIAGNOSIS — J9811 Atelectasis: Secondary | ICD-10-CM | POA: Diagnosis not present

## 2024-06-14 DIAGNOSIS — Z4682 Encounter for fitting and adjustment of non-vascular catheter: Secondary | ICD-10-CM | POA: Diagnosis not present

## 2024-06-14 DIAGNOSIS — J90 Pleural effusion, not elsewhere classified: Secondary | ICD-10-CM | POA: Diagnosis not present

## 2024-06-14 DIAGNOSIS — D509 Iron deficiency anemia, unspecified: Secondary | ICD-10-CM | POA: Diagnosis not present

## 2024-06-14 DIAGNOSIS — M6281 Muscle weakness (generalized): Secondary | ICD-10-CM | POA: Diagnosis not present

## 2024-06-14 DIAGNOSIS — R0602 Shortness of breath: Secondary | ICD-10-CM | POA: Diagnosis not present

## 2024-06-14 NOTE — Telephone Encounter (Signed)
 Too soon for refill, LRF 01/17/24 for 100 and 3 RF.  Requested Prescriptions  Pending Prescriptions Disp Refills   atorvastatin  (LIPITOR) 10 MG tablet 100 tablet 3    Sig: Take 1 tablet (10 mg total) by mouth daily.     Cardiovascular:  Antilipid - Statins Failed - 06/14/2024  8:12 AM      Failed - Lipid Panel in normal range within the last 12 months    Cholesterol  Date Value Ref Range Status  10/02/2023 115 <200 mg/dL Final   LDL Cholesterol (Calc)  Date Value Ref Range Status  10/02/2023 54 mg/dL (calc) Final    Comment:    Reference range: <100 . Desirable range <100 mg/dL for primary prevention;   <70 mg/dL for patients with CHD or diabetic patients  with > or = 2 CHD risk factors. SABRA LDL-C is now calculated using the Martin-Hopkins  calculation, which is a validated novel method providing  better accuracy than the Friedewald equation in the  estimation of LDL-C.  Gladis APPLETHWAITE et al. SANDREA. 7986;689(80): 2061-2068  (http://education.[employer].com/faq/FAQ164)    HDL  Date Value Ref Range Status  10/02/2023 45 (L) > OR = 50 mg/dL Final   Triglycerides  Date Value Ref Range Status  10/02/2023 77 <150 mg/dL Final         Passed - Patient is not pregnant      Passed - Valid encounter within last 12 months    Recent Outpatient Visits           3 weeks ago Pain of thigh muscle   [facility] Glenard Mire, MD   1 month ago Osteopenia determined by x-ray   [facility] Glenard Mire, MD   3 months ago Rib pain   [facility] Glenard Mire, MD   4 months ago Right-sided chest wall pain   [facility] Glenard Mire, MD   4 months ago Left-sided chest wall pain   [facility] Glenard Mire, MD       Future Appointments             In 1 month Glenard, Krichna, MD [facility] [place]

## 2024-06-15 DIAGNOSIS — J449 Chronic obstructive pulmonary disease, unspecified: Secondary | ICD-10-CM | POA: Diagnosis not present

## 2024-06-15 DIAGNOSIS — D61818 Other pancytopenia: Secondary | ICD-10-CM | POA: Diagnosis not present

## 2024-06-15 DIAGNOSIS — J9601 Acute respiratory failure with hypoxia: Secondary | ICD-10-CM | POA: Diagnosis not present

## 2024-06-15 DIAGNOSIS — S73102D Unspecified sprain of left hip, subsequent encounter: Secondary | ICD-10-CM | POA: Diagnosis not present

## 2024-06-15 DIAGNOSIS — D509 Iron deficiency anemia, unspecified: Secondary | ICD-10-CM | POA: Diagnosis not present

## 2024-06-15 DIAGNOSIS — C9110 Chronic lymphocytic leukemia of B-cell type not having achieved remission: Secondary | ICD-10-CM | POA: Diagnosis not present

## 2024-06-15 DIAGNOSIS — I739 Peripheral vascular disease, unspecified: Secondary | ICD-10-CM | POA: Diagnosis not present

## 2024-06-15 DIAGNOSIS — J309 Allergic rhinitis, unspecified: Secondary | ICD-10-CM | POA: Diagnosis not present

## 2024-06-15 DIAGNOSIS — E785 Hyperlipidemia, unspecified: Secondary | ICD-10-CM | POA: Diagnosis not present

## 2024-06-15 DIAGNOSIS — G2581 Restless legs syndrome: Secondary | ICD-10-CM | POA: Diagnosis not present

## 2024-06-15 DIAGNOSIS — J90 Pleural effusion, not elsewhere classified: Secondary | ICD-10-CM | POA: Diagnosis not present

## 2024-06-15 DIAGNOSIS — G629 Polyneuropathy, unspecified: Secondary | ICD-10-CM | POA: Diagnosis not present

## 2024-06-15 DIAGNOSIS — R530 Neoplastic (malignant) related fatigue: Secondary | ICD-10-CM | POA: Diagnosis not present

## 2024-06-15 DIAGNOSIS — F419 Anxiety disorder, unspecified: Secondary | ICD-10-CM | POA: Diagnosis not present

## 2024-06-15 DIAGNOSIS — J910 Malignant pleural effusion: Secondary | ICD-10-CM | POA: Diagnosis not present

## 2024-06-15 DIAGNOSIS — K219 Gastro-esophageal reflux disease without esophagitis: Secondary | ICD-10-CM | POA: Diagnosis not present

## 2024-06-15 DIAGNOSIS — F32A Depression, unspecified: Secondary | ICD-10-CM | POA: Diagnosis not present

## 2024-06-15 DIAGNOSIS — D696 Thrombocytopenia, unspecified: Secondary | ICD-10-CM | POA: Diagnosis not present

## 2024-06-15 DIAGNOSIS — N183 Chronic kidney disease, stage 3 unspecified: Secondary | ICD-10-CM | POA: Diagnosis not present

## 2024-06-15 DIAGNOSIS — D649 Anemia, unspecified: Secondary | ICD-10-CM | POA: Diagnosis not present

## 2024-06-15 DIAGNOSIS — G893 Neoplasm related pain (acute) (chronic): Secondary | ICD-10-CM | POA: Diagnosis not present

## 2024-06-16 ENCOUNTER — Telehealth: Payer: Self-pay | Admitting: Physical Therapy

## 2024-06-16 ENCOUNTER — Ambulatory Visit: Admitting: Physical Therapy

## 2024-06-16 DIAGNOSIS — R0600 Dyspnea, unspecified: Secondary | ICD-10-CM | POA: Diagnosis not present

## 2024-06-16 DIAGNOSIS — D509 Iron deficiency anemia, unspecified: Secondary | ICD-10-CM | POA: Diagnosis not present

## 2024-06-16 DIAGNOSIS — C9110 Chronic lymphocytic leukemia of B-cell type not having achieved remission: Secondary | ICD-10-CM | POA: Diagnosis not present

## 2024-06-16 DIAGNOSIS — D61818 Other pancytopenia: Secondary | ICD-10-CM | POA: Diagnosis not present

## 2024-06-16 DIAGNOSIS — Z720 Tobacco use: Secondary | ICD-10-CM | POA: Diagnosis not present

## 2024-06-16 DIAGNOSIS — J910 Malignant pleural effusion: Secondary | ICD-10-CM | POA: Diagnosis not present

## 2024-06-16 DIAGNOSIS — J9601 Acute respiratory failure with hypoxia: Secondary | ICD-10-CM | POA: Diagnosis not present

## 2024-06-16 DIAGNOSIS — J90 Pleural effusion, not elsewhere classified: Secondary | ICD-10-CM | POA: Diagnosis not present

## 2024-06-16 NOTE — Telephone Encounter (Signed)
 Pt contacted via telephone and informing of missed appointment. Pt reports that she has been admitted to [facility] for fluid in the Lungs. Would like to continue PT following d/c from hospital. Instructed pt the new order from MD will be required prior to restarting PT. Pt verbalized understanding and agreed. Will cancel PT appointments until new order received.   Massie Dollar PT, DPT  Physical Therapist - [place]  [facility]  10:16 AM 06/16/24

## 2024-06-17 ENCOUNTER — Other Ambulatory Visit: Payer: Self-pay

## 2024-06-17 ENCOUNTER — Other Ambulatory Visit (HOSPITAL_COMMUNITY): Payer: Self-pay

## 2024-06-17 DIAGNOSIS — D61818 Other pancytopenia: Secondary | ICD-10-CM | POA: Diagnosis not present

## 2024-06-17 DIAGNOSIS — J939 Pneumothorax, unspecified: Secondary | ICD-10-CM | POA: Diagnosis not present

## 2024-06-17 DIAGNOSIS — J9601 Acute respiratory failure with hypoxia: Secondary | ICD-10-CM | POA: Diagnosis not present

## 2024-06-17 DIAGNOSIS — C9110 Chronic lymphocytic leukemia of B-cell type not having achieved remission: Secondary | ICD-10-CM | POA: Diagnosis not present

## 2024-06-17 DIAGNOSIS — J90 Pleural effusion, not elsewhere classified: Secondary | ICD-10-CM | POA: Diagnosis not present

## 2024-06-17 DIAGNOSIS — J910 Malignant pleural effusion: Secondary | ICD-10-CM | POA: Diagnosis not present

## 2024-06-18 ENCOUNTER — Ambulatory Visit: Admitting: Physical Therapy

## 2024-06-18 ENCOUNTER — Other Ambulatory Visit: Payer: Self-pay

## 2024-06-21 DIAGNOSIS — Z01818 Encounter for other preprocedural examination: Secondary | ICD-10-CM | POA: Diagnosis not present

## 2024-06-21 DIAGNOSIS — C9110 Chronic lymphocytic leukemia of B-cell type not having achieved remission: Secondary | ICD-10-CM | POA: Diagnosis not present

## 2024-06-21 DIAGNOSIS — J910 Malignant pleural effusion: Secondary | ICD-10-CM | POA: Diagnosis not present

## 2024-06-21 DIAGNOSIS — Z79899 Other long term (current) drug therapy: Secondary | ICD-10-CM | POA: Diagnosis not present

## 2024-06-21 DIAGNOSIS — D649 Anemia, unspecified: Secondary | ICD-10-CM | POA: Diagnosis not present

## 2024-06-22 ENCOUNTER — Ambulatory Visit: Attending: Family Medicine | Admitting: Physical Therapy

## 2024-06-22 DIAGNOSIS — C9110 Chronic lymphocytic leukemia of B-cell type not having achieved remission: Secondary | ICD-10-CM | POA: Diagnosis not present

## 2024-06-22 DIAGNOSIS — Z008 Encounter for other general examination: Secondary | ICD-10-CM | POA: Diagnosis not present

## 2024-06-22 DIAGNOSIS — R262 Difficulty in walking, not elsewhere classified: Secondary | ICD-10-CM | POA: Diagnosis present

## 2024-06-22 DIAGNOSIS — R269 Unspecified abnormalities of gait and mobility: Secondary | ICD-10-CM | POA: Insufficient documentation

## 2024-06-22 DIAGNOSIS — R2689 Other abnormalities of gait and mobility: Secondary | ICD-10-CM | POA: Insufficient documentation

## 2024-06-22 DIAGNOSIS — M6281 Muscle weakness (generalized): Secondary | ICD-10-CM | POA: Diagnosis present

## 2024-06-22 DIAGNOSIS — D649 Anemia, unspecified: Secondary | ICD-10-CM | POA: Diagnosis not present

## 2024-06-22 DIAGNOSIS — R2681 Unsteadiness on feet: Secondary | ICD-10-CM | POA: Diagnosis present

## 2024-06-22 NOTE — Therapy (Signed)
 OUTPATIENT PHYSICAL THERAPY LOWER EXTREMITY TREATMENT/  Re-evaluation.    Patient Name: Monique [name] MRN: [id number] DOB:Mar 18, 1971, 53 y.o., female Today's Date: 06/22/2024  END OF SESSION:  PT End of Session - 06/22/24 0843     Visit Number 4    Number of Visits 16    Date for Recertification  07/29/24    Progress Note Due on Visit 10    PT Start Time 0845    PT Stop Time 0925    PT Time Calculation (min) 40 min    Activity Tolerance Patient limited by pain    Behavior During Therapy [facility] for tasks assessed/performed            Past Medical History:  Diagnosis Date   Anxiety    Bursitis    leg pain   Carpal tunnel syndrome    Cervical dysplasia    hx LEEP over 18 years ago.    Chronic lymphocytic leukemia (HCC) 2018   Dr Jacobo.  (in lymph nodes)   COPD (chronic obstructive pulmonary disease) (HCC)    COVID-19 2021   Depression    Eating disorder    GERD (gastroesophageal reflux disease)    History of self-harm    Insomnia    Obsession    Tobacco abuse    Vitamin B12 deficiency (non anemic)    Past Surgical History:  Procedure Laterality Date   BREAST BIOPSY Right 01/05/2018   US  guided biopsy of 2 areas and 1 lymph node, MIXED INFLAMMATION AND GIANT CELL REACTION   CERVICAL BIOPSY  W/ LOOP ELECTRODE EXCISION     COLONOSCOPY WITH PROPOFOL  N/A 04/21/2019   Procedure: COLONOSCOPY WITH PROPOFOL ;  Surgeon: Janalyn Keene NOVAK, MD;  Location: [facility];  Service: Gastroenterology;  Laterality: N/A;   IR BONE MARROW BIOPSY & ASPIRATION  04/26/2024   OTHER SURGICAL HISTORY     scar tissue removed from vocal cords   TUBAL LIGATION     VAGINAL HYSTERECTOMY N/A 01/22/2021   Procedure: HYSTERECTOMY VAGINAL; BILATERAL SALPINGECTOMY;  Surgeon: Connell Davies, MD;  Location: [facility] OR[facility];  Service: Gynecology;  Laterality: N/A;   vocal cord surgery  2005   Patient Active Problem List   Diagnosis Date Noted   Stage 3a chronic kidney disease (HCC) 10/02/2023    Senile purpura 10/02/2023   Major depression in remission 10/02/2023   Pancytopenia (HCC) 10/02/2023   Post-menopause on HRT (hormone replacement therapy) 11/08/2022   At risk for sleep apnea 09/19/2021   Lumbar foraminal stenosis (L4-5) (Bilateral) (R>L) 06/05/2021   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 06/05/2021   Lumbosacral radiculopathy/radiculitis at L5 (Bilateral) 06/05/2021   PAD (peripheral artery disease) 02/14/2021   CIN II (cervical intraepithelial neoplasia II) 02/01/2021   S/P vaginal hysterectomy 01/22/2021   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/09/2020   Bilateral leg pain 11/03/2019   Iron  deficiency anemia 10/15/2019   Bilateral arm pain 08/05/2019   Bilateral hand numbness 08/05/2019   Weakness of both hands 08/05/2019   Chronic musculoskeletal pain 07/19/2019   Chronic neuropathic pain 07/19/2019   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   Special screening for malignant neoplasms, colon    Polyp of sigmoid colon    Hemorrhoids    Trigeminal neuralgia 10/05/2018   Lumbar radiculitis (Right) 09/24/2018   Hypocalcemia 06/03/2018   Vitamin D  insufficiency 06/03/2018   Abnormal MRI, lumbar spine (06/02/2020) 06/03/2018   Chronic hip pain (Right) 06/03/2018   Spondylosis without myelopathy or radiculopathy, lumbar region 06/03/2018   DDD (  degenerative disc disease), lumbar 06/02/2018   Lumbar facet hypertrophy 06/02/2018   Lumbar facet arthropathy 06/02/2018   Lumbar facet syndrome (Bilateral) (R>L) 06/02/2018   Marijuana use 05/19/2018   Chronic lower extremity pain (1ry area of Pain) (Bilateral) (R>L) 05/13/2018   Chronic low back pain (2ry area of Pain) (Bilateral) (R>L) w/ sciatica (Bilateral) 05/13/2018   Chronic pain syndrome 05/13/2018   Disorder of skeletal system 05/13/2018   Pharmacologic therapy 05/13/2018   Problems influencing health status 05/13/2018   Mastalgia 05/05/2018   Breast mass, right 01/07/2018   Face pain  12/11/2017   Tingling 12/11/2017   Thoracic aortic atherosclerosis 08/20/2017   Emphysema of lung (HCC) 08/20/2017   Adductor tendinitis 04/23/2017   Trochanteric bursitis of right hip 04/23/2017   GERD without esophagitis 12/11/2016   CLL (chronic lymphocytic leukemia) (HCC) 11/24/2016   Stress incontinence 09/04/2016   B12 deficiency 07/10/2015   Insomnia 07/10/2015   Anorexia nervosa, restricting type (HCC) 07/10/2015   Anxiety, generalized 07/10/2015   H/O suicide attempt 07/10/2015   Lymphocytosis 07/10/2015   Dysplasia of cervix, low grade (CIN 1) 07/10/2015   Obsessive-compulsive disorder 07/10/2015   Tobacco use 07/10/2015   History of cervical dysplasia 07/18/2014    PCP: Glenard Mire, MD   REFERRING PROVIDER: Sowles, Krichna, MD   REFERRING DIAG: M79.18 (ICD-10-CM) - Pain of thigh muscle   THERAPY DIAG:  Abnormality of gait and mobility  Unsteadiness on feet  Difficulty in walking, not elsewhere classified  Muscle weakness (generalized)  Other abnormalities of gait and mobility  Rationale for Evaluation and Treatment: Rehabilitation  ONSET DATE: 05/03/24  SUBJECTIVE:   SUBJECTIVE STATEMENT:    Today: Pt has continued pain in L hip, continued pain with mobility. Was seen in hospital last week due to fluid in lungs from CLL.  Has restarted CA treatment at [facility]. At oncology yesterday; pt received 2 units of RBCs. Will be starting Chemotherapy and Cell treatment per pt in the next 4 to 6 weeks. Is interested in transitioning to HHPT if approved by insurance.    From eval: Pt has significant discomfort in her left ant thigh in the inguinal crease. Pain radiates down the leg from there. Pt also has neuropathy on the R thigh. Pain on the left began about 1 month ago. She went to PCP, got some numbing cream. Pt is still considering options for seeing MD but is awaiting for PT recommendations. Pt does not recall when the pain began. She does know that  rolling in bed causes a lot of pain.   PERTINENT HISTORY: Past medical history of bursitis, anxiety, chronic lymphocytic leukemia, COPD, depression, and vitamin B12 deficiency. PAIN:  Are you having pain? Yes: NPRS scale: 10/10 walking, no pain sitting, 10/10 rolling over in bed  Pain location: left ant hip along inguinal crease  Pain description: sharp, radiating  Aggravating factors: walking, rolling over in bed  Relieving factors: sitting, rest   PRECAUTIONS: Fall  RED FLAGS: Known lymphocytic leukemia    WEIGHT BEARING RESTRICTIONS: No  FALLS:  Has patient fallen in last 6 months? No  LIVING ENVIRONMENT: Lives with: lives alone Lives in: House/apartment Stairs: Yes: External: 3 steps; bilateral but cannot reach both Has following equipment at home: Single point cane  OCCUPATION: n/a  PLOF: Independent  PATIENT GOALS: improve pain, mobility, and function   NEXT MD VISIT: Not scheduled for this problem yet.   OBJECTIVE:  Note: Objective measures were completed at Evaluation unless otherwise noted.  DIAGNOSTIC  FINDINGS: no images yet   PATIENT SURVEYS:   LEFS  Extreme difficulty/unable (0), Quite a bit of difficulty (1), Moderate difficulty (2), Little difficulty (3), No difficulty (4) Survey date:    Any of your usual work, housework or school activities 0  2. Usual hobbies, recreational or sporting activities 0  3. Getting into/out of the bath 0  4. Walking between rooms 0  5. Putting on socks/shoes 0  6. Squatting  0  7. Lifting an object, like a bag of groceries from the floor 0  8. Performing light activities around your home 0  9. Performing heavy activities around your home 0  10. Getting into/out of a car 0  11. Walking 2 blocks 0  12. Walking 1 mile 0  13. Going up/down 10 stairs (1 flight) 0  14. Standing for 1 hour 0  15.  sitting for 1 hour 0  16. Running on even ground 0  17. Running on uneven ground 0  18. Making sharp turns while running  fast 0  19. Hopping  0  20. Rolling over in bed 0  Score total:  0     COGNITION: Overall cognitive status: Within functional limits for tasks assessed     SENSATION: Not tested  POSTURE: rounded shoulders, forward head, and flexed trunk   PALPATION: Tender to palpation along inguinal crease, quad and hip flexor musculature   LOWER EXTREMITY ROM:  Pain with FABER  LOWER EXTREMITY MMT:  MMT Right eval Left eval  Hip flexion 4+ 3+  Hip extension    Hip abduction 4+ 3+  Hip adduction 4+ 3+  Hip internal rotation    Hip external rotation    Knee flexion 4+ 4  Knee extension 4+ 4  Ankle dorsiflexion 4+ 4  Ankle plantarflexion    Ankle inversion    Ankle eversion     (Blank rows = not tested)  LOWER EXTREMITY SPECIAL TESTS:  Hip special tests: Belvie (FABER) test: positive , Thomas test: Not tested, Hip scouring test: positive , and Piriformis test: positive     GAIT: Distance walked: 5 ft with SPC- flexed trunk, flexed knees, decreased step leng B  Assistive device utilized: Single point cane Level of assistance: Modified independence                                                                                                                                 TREATMENT DATE: 06/22/24    TE- To improve strength, endurance, mobility, and function of specific targeted muscle groups or improve joint range of motion or improve muscle flexibility  Nustep AAROM level 1 x 6 min with cues to remain in pain free Range onteh R hip. No increased pain reported by pt.   Stand pivot tranfers to and from nustep, chair and mat table with supervision assist and UE supported on SPC.   Sit<>supine with assist from PT back into sitting only  on this day.  bur required assist from UE to bring LLE into bed.   SAQ x 12  Heel slide x 12 bil  LTR x 12 bil  Ankle pumps x 15.   Manual: Long arc gentle BLE  hip distraction 3 x 1 min bil    Manual: STM with TP release to TFL and  quadriceps as x 12 min   Pt performed 5 time sit<>stand (5xSTS): 27.6 sec (>15 sec indicates increased fall risk)    PATIENT EDUCATION: Education details: . HHPT vs OPPT given new start of chemotherapy treatment. Need for possible orthopedic referral if hip pain does not improve.  Person educated: Patient Education method: Explanation Education comprehension: verbalized understanding   HOME EXERCISE PROGRAM: Access Code: [email or web address] URL: [email or web address] Date: 06/07/2024 Prepared by: Lonni Gainer  Exercises - Supine Transversus Abdominis Bracing - Hands on Stomach  - 1 x daily - 7 x weekly - 2 sets - 10 reps - 3 sec hold - Supine Gluteal Sets  - 1 x daily - 7 x weekly - 2 sets - 10 reps - 3 sec hold   ASSESSMENT:  CLINICAL IMPRESSION:  From eval:  Patient is a 53 year old female presented physical therapy with significant pain in her left inguinal crease rating it into her iliopsoas and rectus femoris muscular region. Pt was recently seen in hospital for plural effusion d/t CLL. Has now re-started CA treatment. With new CA treatment regiment starting soon, pt would benefit from transition to HHPT for management of hip pain and activity tolerance. Pt will request HHPT referral from oncology, and d/c when HHPT course begins. Continued to instructed patient that if she did not show marked improvement within a few physical therapy sessions a physical therapist would not continue with physical therapy and would send patient to Dr. For further evaluation. Was noted to have less severe pain and no numbness on this day.  Will benefit from skilled PT at this time until transition to HHPT or orthopedic consult.   OBJECTIVE IMPAIRMENTS: Abnormal gait, decreased activity tolerance, difficulty walking, decreased ROM, decreased strength, impaired perceived functional ability, and pain.   ACTIVITY LIMITATIONS: carrying, lifting, squatting, stairs, bed mobility, and locomotion  level  PARTICIPATION LIMITATIONS: driving, community activity, and yard work  PERSONAL FACTORS: Age, Time since onset of injury/illness/exacerbation, and 3+ comorbidities: bursitis, anxiety, chronic lymphocytic leukemia, COPD, depression, and vitamin B12 deficiency are also affecting patient's functional outcome.   REHAB POTENTIAL: Good  CLINICAL DECISION MAKING: Evolving/moderate complexity  EVALUATION COMPLEXITY: Moderate   GOALS: Goals reviewed with patient? Yes  SHORT TERM GOALS: Target date: 06/17/2024      Patient will be independent in home exercise program to improve strength/mobility for better functional independence with ADLs. Baseline: No HEP currently  Goal status: INITIAL   LONG TERM GOALS: Target date: 07/15/2024    Patient will improve lower extremity functional scale by 25 points or greater in order to indicate statistically significant improvement in lower extremity function. Baseline: 0/80 Goal status: INITIAL  2.  Pt will improve L hip flexion and ABD strength to 4+/5 or greater to demonstrate improved L hip muscle function without pain.   Baseline: see eval chart  Goal status: INITIAL  3.  Pt will improve 5XSTS test by 5 sec or more to indicate improved LE strength and power  Baseline: 27.6sec 10/7 Goal status: INITIAL   PLAN:  PT FREQUENCY: 1-2x/week  PT DURATION: 6 weeks  PLANNED INTERVENTIONS: 97750- Physical Performance Testing, 97110-Therapeutic exercises, 97530- Therapeutic  activity, [id number]- Neuromuscular re-education, [id number]- Self Care, [id number]- Manual therapy, [phone]- Gait training, Patient/Family education, Balance training, Joint mobilization, Cryotherapy, and Moist heat  PLAN FOR NEXT SESSION:   Trial heat and nustep for pain relief and LE strength, long arc distraction L hip, gluteal and core strength as indicate; provide HEP  If no improvement within next 2-3 weeks visits seek orthopaedic referral for further workup; already  instructed pt she should seek orthopaedic referral at eval.    Massie FORBES Dollar, PT 06/22/2024, 8:44 AM

## 2024-06-23 ENCOUNTER — Other Ambulatory Visit: Payer: Self-pay

## 2024-06-23 ENCOUNTER — Ambulatory Visit: Payer: Self-pay

## 2024-06-23 NOTE — Telephone Encounter (Signed)
 FYI Only or Action Required?: FYI only for provider.  Patient is followed in Pulmonology for Patient being scheduled as a New Patient due to last visit being in 2022, last seen on 09/12/2021 by Hope Almarie ORN, NP.  Called Nurse Triage reporting Shortness of Breath.  Symptoms began several days ago.  Interventions attempted: Increased fluids/rest.  Symptoms are: gradually worsening.  Triage Disposition: See HCP Within 4 Hours (Or PCP Triage)  Patient/caregiver understands and will follow disposition?: No---patient unable to come in today and wanted to be seen tomorrow at [facility] transferred to the [facility] office for scheduling and patient is advised that if anything gets worse to go to the Emergency Room and she verbalized understanding.             Copied from CRM [id number]. Topic: Clinical - Red Word Triage >> Jun 23, 2024  1:19 PM Devaughn RAMAN wrote: Red Word that prompted transfer to Nurse Triage: shortness of breath Reason for Disposition . [1] Longstanding difficulty breathing (e.g., CHF, COPD, emphysema) AND [2] WORSE than normal  Answer Assessment - Initial Assessment Questions Patient states that for the past few days she has started having some increased shortness of breath only on exertion. She states that she is fine when she is sitting still. Does not have a pulse ox to check her oxygen saturation level Does not wear oxygen History of COPD----has not had to utilize her rescue inhaler for any shortness of breath Just got out of the hospital last week  (6 days ago) Patient states she had a procedure that drains the fluid from her lungs Had it done with a pigtail at [facility] ---next week will be the two week period and she was advised she would need it done within two weeks Offered patient an appointment today if available and patient states she wanted to come in tomorrow. Patient is advised that the recommendation would be to be seen today but patient  requested an appointment tomorrow if possible due to transportation and she states she doesn't want to go to the ER unless she has to She is advised that if she gets worse to seek immediate medical attention by going to the Emergency Room and she verbalized understanding of this. Called [facility] in [place] & advised them of patient not being able to come in for an appointment today and for patient scheduling, advised [facility] patient would be a new patient and hadnt been there since 2022 and has a recent referral and warm transferred patient at this time.  Protocols used: Breathing Difficulty-A-AH

## 2024-06-23 NOTE — Telephone Encounter (Signed)
 Noted, NFN. Pt scheduled 10/10

## 2024-06-24 ENCOUNTER — Telehealth: Payer: Self-pay | Admitting: Physical Therapy

## 2024-06-24 ENCOUNTER — Other Ambulatory Visit: Payer: Self-pay

## 2024-06-24 ENCOUNTER — Ambulatory Visit: Admitting: Physical Therapy

## 2024-06-24 DIAGNOSIS — R269 Unspecified abnormalities of gait and mobility: Secondary | ICD-10-CM | POA: Diagnosis not present

## 2024-06-24 DIAGNOSIS — M6281 Muscle weakness (generalized): Secondary | ICD-10-CM | POA: Diagnosis not present

## 2024-06-24 NOTE — Telephone Encounter (Signed)
 Pt contacted via telephone and informed  of missed appointment and informed pt of future PT appointment date and time. Pt reports that she is having a heard time breathing. Will be seeing pulmonology tomorrow. Plans to attend next week.   Massie Dollar PT, DPT  Physical Therapist - [place]  [facility]  9:34 AM 06/24/24

## 2024-06-25 ENCOUNTER — Ambulatory Visit
Admission: RE | Admit: 2024-06-25 | Discharge: 2024-06-25 | Disposition: A | Discharge status: Home / Self Care | Source: Ambulatory Visit | Attending: Radiology | Admitting: Radiology

## 2024-06-25 ENCOUNTER — Encounter: Payer: Self-pay | Admitting: Pulmonary Disease

## 2024-06-25 ENCOUNTER — Other Ambulatory Visit: Payer: Self-pay | Admitting: Pulmonary Disease

## 2024-06-25 ENCOUNTER — Ambulatory Visit
Admission: RE | Admit: 2024-06-25 | Discharge: 2024-06-25 | Disposition: A | Discharge status: Home / Self Care | Source: Ambulatory Visit | Attending: Pulmonary Disease | Admitting: Pulmonary Disease

## 2024-06-25 ENCOUNTER — Ambulatory Visit (INDEPENDENT_AMBULATORY_CARE_PROVIDER_SITE_OTHER): Admitting: Pulmonary Disease

## 2024-06-25 VITALS — BP 110/86 | HR 94 | Temp 97.7°F | Ht 67.0 in | Wt 171.8 lb

## 2024-06-25 DIAGNOSIS — C9110 Chronic lymphocytic leukemia of B-cell type not having achieved remission: Secondary | ICD-10-CM

## 2024-06-25 DIAGNOSIS — J90 Pleural effusion, not elsewhere classified: Secondary | ICD-10-CM | POA: Insufficient documentation

## 2024-06-25 DIAGNOSIS — D696 Thrombocytopenia, unspecified: Secondary | ICD-10-CM | POA: Diagnosis not present

## 2024-06-25 DIAGNOSIS — F17210 Nicotine dependence, cigarettes, uncomplicated: Secondary | ICD-10-CM

## 2024-06-25 LAB — BODY FLUID CELL COUNT WITH DIFFERENTIAL
Eos, Fluid: 0 %
Lymphs, Fluid: 97 %
Monocyte-Macrophage-Serous Fluid: 2 %
Neutrophil Count, Fluid: 1 %
Total Nucleated Cell Count, Fluid: 9292 uL

## 2024-06-25 LAB — LACTATE DEHYDROGENASE, PLEURAL OR PERITONEAL FLUID: LD, Fluid: 392 U/L — ABNORMAL HIGH (ref 3–23)

## 2024-06-25 LAB — PROTEIN, PLEURAL OR PERITONEAL FLUID: Total protein, fluid: <3 g/dL

## 2024-06-25 LAB — GLUCOSE, PLEURAL OR PERITONEAL FLUID: Glucose, Fluid: 94 mg/dL

## 2024-06-25 MED ORDER — LIDOCAINE HCL (PF) 1 % IJ SOLN
10.0000 mL | Freq: Once | INTRAMUSCULAR | Status: AC
  Administered 2024-06-25: 10 mL via INTRADERMAL
  Filled 2024-06-25: qty 10

## 2024-06-25 NOTE — Patient Instructions (Signed)
 VISIT SUMMARY:  Today, you were seen for evaluation of your right pleural effusion, which is causing you difficulty in breathing during activities. We discussed your chronic lymphocytic leukemia and its impact on your current condition. You have a history of low platelet counts and are a current smoker, though you have reduced your smoking significantly.  YOUR PLAN:  -RIGHT PLEURAL EFFUSION: A pleural effusion is a buildup of fluid between the tissues that line the lungs and the chest. In your case, it is related to your chronic lymphocytic leukemia and is causing you to feel short of breath. We will arrange for a thoracentesis today at the medical mall to remove the fluid. Additionally, we will coordinate with [facility] to discuss the possibility of placing an indwelling drain to help manage the fluid buildup in the future.  -THROMBOCYTOPENIA: Thrombocytopenia means having a low platelet count, which can increase the risk of bleeding during procedures. We will order a complete blood count to check your platelet levels before proceeding with the thoracentesis.  -TOBACCO USE: You are currently smoking half a pack of cigarettes a day, which is a reduction from your previous habit of one and a half packs per day. Reducing smoking is beneficial for your overall health, and we encourage you to continue working towards quitting completely.  INSTRUCTIONS:  Please proceed to the medical mall for your thoracentesis today. We will also coordinate with [facility], [facility], [facility] for a potential consultation regarding the placement of an indwelling drain. Additionally, we will check your platelet levels before the procedure to ensure it is safe to proceed.

## 2024-06-25 NOTE — Progress Notes (Signed)
 Subjective:    Patient ID: Monique [name], female    DOB: 1970-11-10, 53 y.o.   MRN: [id number]  Patient Care Team: Sowles, Krichna, MD as PCP - General (Family Medicine) Jacobo Evalene PARAS, MD as Consulting Physician (Oncology) [name], [facility] (Inactive) as Pharmacist (Pharmacist) [name], RN as Registered Nurse (Oncology) Borders, Fonda SAUNDERS, NP as Nurse Practitioner (Hospice and Palliative Medicine) Lenn Aran, MD as Consulting Physician (Radiation Oncology) Pa, [facility]  Chief Complaint  Patient presents with   Hospitalization Follow-up    Shortness of breath on exertion.     BACKGROUND: Patient is a 53 year old current smoker who presents for evaluation of a right sided pleural effusion in the setting of chronic lymphocytic leukemia.  She was admitted to [facility] in late September and noted to have a right sided recurrent pleural effusion.  She had a chest tube placed for drainage of the effusion on 14 June 2024.  She had the chest tube removed on 17 June 2024.  Recommendation at that time by the Interventional Pulmonary team at [facility] was to follow-up in 2 weeks to determine whether repeat thoracentesis versus PleurX catheter should be considered.  Fluid is positive for CLL.  Patient follows for her CLL at [facility] in [place] with Dr. Sonny Lever.  Local oncologist is Dr. Evalene Jacobo.   HPI Discussed the use of AI scribe software for clinical note transcription with the patient, who gave verbal consent to proceed.  History of Present Illness   Monique [name] is a 53 year old female with chronic lymphocytic leukemia who presents for evaluation of a right pleural effusion.  She has experienced dyspnea on exertion, which necessitates frequent pauses to catch her breath during household activities. The effusion has been tapped twice, once by IR and once during her recent admission to [facility].  A  pigtail catheter was placed during her hospital stay. She has a history of thrombocytopenia.  She has an extensive oncologic history as noted below.  She smokes about half a pack of cigarettes a day, having reduced from a pack and a half.  She has an upcoming appointment on 13 October at [facility] for her oncology follow-up.  I reviewed her imaging as well as her extensive medical records.  She does not endorse any other symptomatology today.    ONCOLOGY HISTORY  CLL (chronic lymphocytic leukemia)  10/29/2016 Initial Diagnosis  CLL (chronic lymphocytic leukemia) (CMD) based upon peripheral blood flow cytometry performed at an outside facility.  01/21/2017 Imaging  CT of T/A/P showed diffuse lymphadenpathy, bulky in the abdomen.  01/21/2017 - 02/20/2017 Chemotherapy  Weekly rituximab  x 4, initated because of CT findings as well as night sweats and increasing white cell count.  01/08/2018 Pathology  New lymphadeonpathy developed and a right axillary lymph node biopsy was consistent with CLL.   01/21/2018 Imaging : CT of T/A/P was consistent with progression of disease (POD)  01/28/2018 through 03/25/2018 -3 cycles of Bendamustine  and rituximab  (BR) chemotherapy   04/2018 Imaging: CT scan reportedly showed stable disease  07/14/2018 Imaging: Repeat CT of T/A/P showed no increase in lymphadenopathy.  07/2018 - Chemotherapy: Ibrutinib  480 mg started for symptomatic axillary lymphadenopathy and night sweats.  10/22/2018 Imaging: Repeat CT scan showed improved lymphadenopathy.  10/21/2019 - Oncology Treatment: Ferritin was less than 5 so she received IV ferraheme.  06/16/2020 Complications: Sometime between 06/16/2020 and 09/26/2020 it appears that her ibrutinib  was held due  to POD although in future notes it appears that she was still on it.   03/2021 - Oncology Treatment: Received IV iron  again  07/05/2021 - Chemotherapy: Patient possibly on rubraca (rucaparib) in addition to the  ibrutinib  but this is not confirmed  01/22/2022 Imaging: CT showed POD  01/31/2022 - Chemotherapy: Cycle 1 of venetoclax  and rituximab ; however this appears to have been discontinued shortly after it was begun due to diarrhea and an increase in LFTs.  02/28/2022 - Chemotherapy: Lenalidomide  10 mg daily x 21 days given concomitantly with rituximab  every 4 weeks.  03/14/2022 - Chemotherapy: Ongoing chemotherapy as above  07/11/2022 - Chemotherapy: Cycle 8 of monthly rituximab  with lenalidomide  10 mg daily given for 21 days.  07/31/2022 Imaging: CT scan reportedly showed mild POD  07/31/2022 - Chemotherapy: Received cycle 9 of monthly rituximab  with lenalidomide  10 mg x 21 days.  09/12/2022 - Chemotherapy: Cycle 10 of monthly rituximab  with lenalidomide  10 mg x 21 days. Mild neuropathy was noted.  12/12/2022 - Chemotherapy: Cycle 13 of monthly rituximab  with lenalidomide  10 mg x 21 days.   03/21/2023 Imaging: CT scan showed left axillary lymph node increasing from 13 mm to 20 mm.  03/21/2023 - Chemotherapy: Despite those CT findings she went on to receive cycle 16 of monthly rituximab  with lenalidomide  10 mg x 21 days.   04/16/2023 Imaging: PET scan showed mild POD.  04/16/2023 - Chemotherapy: Received cycle 17 of monthly rituximab  with lenalidomide  10 mg x 21 days. This was noted to be her last dose of rituximab . She continued on the lenalidomide .  05/12/2023 - Radiation: She received 400 cGy in 2 fractions to the left axillary region.  08/06/2023 Imaging: Repeat PET scan was performed; marked interval enlargement of LEFT periaortic retroperitoneal nodal mass with significant increase in metabolic activity concerning for transformation chronic lymphocytic leukemia.  Stable enlarged axillary and mediastinal lymph nodes with mild metabolic activity.  Interval increase in the pelvic adenopathy size and metabolic activity.  Mild increase in upper abdominal periaortic adenopathy.  09/15/2023 Pathology:  CT guided biopsy of a left retroperitoneal mass showed CLL.  10/07/2023 - Radiation: She received 30 Gy over 15 fractions to the left retroperitoneal area.  11/17/2023 Complications: Developed thrombocytopenia, platelets dropped to 37K and the lenalidomide  was held.  12/31/2023 - Chemotherapy: Thrombocytopenia resolved, resumed lenalidomide .  02/06/2024 Complications: Noted to have mild POD but continued on the lenalidomide .  03/2024 Complications: Her thrombocytopenia returned and her lenalidomide  was held again.  04/26/2024 Pathology: IR guided bone marrow biopsy revealed a 90% cellular marrow with 95% CLL cells.  05/04/2024 Imaging: PET scan showed mixed response to therapy with increased pleural thickening and an increased SUV in the right hemithorax and enlarging right perirectal lymph nodes (SUV 2.9).  Large pleural effusion noted.  05/11/2024 Pathology: Thoracentesis revealed the pleural fluid to have CLL cells.  05/12/2024 - Oncology Treatment: She was subsequently referred to [facility] for potential CAR-T therapy but she was out of their network.  06/13/2024 - 06/17/2024 Hospital Admission-[facility]: She was admitted 9/28-10/2/25 to [facility] hospital. She presented to the ED for dyspnea, tachypnea, new O2 requirement. CT showed large R pleural effusion and diffuse lymphadenopathy. Chest tube was placed during admission and removed by discharge. Fluid tested positive for CLL.   06/24/2024 - Chemotherapy: She established care with Dr. Alto and started on Pirtobrutinib ~06/24/24   Review of Systems A 10 point review of systems was performed and it is as noted above otherwise negative.   Past Medical History:  Diagnosis Date   Anxiety    Bursitis    leg pain   Carpal tunnel syndrome    Cervical dysplasia    hx LEEP over 18 years ago.    Chronic lymphocytic leukemia (HCC) 2018   Dr Jacobo.  (in lymph nodes)   COPD (chronic obstructive pulmonary disease) (HCC)    COVID-19 2021   Depression    Eating  disorder    GERD (gastroesophageal reflux disease)    History of self-harm    Insomnia    Obsession    Tobacco abuse    Vitamin B12 deficiency (non anemic)     Past Surgical History:  Procedure Laterality Date   BREAST BIOPSY Right 01/05/2018   US  guided biopsy of 2 areas and 1 lymph node, MIXED INFLAMMATION AND GIANT CELL REACTION   CERVICAL BIOPSY  W/ LOOP ELECTRODE EXCISION     COLONOSCOPY WITH PROPOFOL  N/A 04/21/2019   Procedure: COLONOSCOPY WITH PROPOFOL ;  Surgeon: Janalyn Keene NOVAK, MD;  Location: [facility] ENDOSCOPY;  Service: Gastroenterology;  Laterality: N/A;   IR BONE MARROW BIOPSY & ASPIRATION  04/26/2024   OTHER SURGICAL HISTORY     scar tissue removed from vocal cords   TUBAL LIGATION     VAGINAL HYSTERECTOMY N/A 01/22/2021   Procedure: HYSTERECTOMY VAGINAL; BILATERAL SALPINGECTOMY;  Surgeon: Connell Davies, MD;  Location: [facility] ORS;  Service: Gynecology;  Laterality: N/A;   vocal cord surgery  2005    Patient Active Problem List   Diagnosis Date Noted   Stage 3a chronic kidney disease (HCC) 10/02/2023   Senile purpura 10/02/2023   Major depression in remission 10/02/2023   Pancytopenia (HCC) 10/02/2023   Post-menopause on HRT (hormone replacement therapy) 11/08/2022   At risk for sleep apnea 09/19/2021   Lumbar foraminal stenosis (L4-5) (Bilateral) (R>L) 06/05/2021   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 06/05/2021   Lumbosacral radiculopathy/radiculitis at L5 (Bilateral) 06/05/2021   PAD (peripheral artery disease) 02/14/2021   CIN II (cervical intraepithelial neoplasia II) 02/01/2021   S/P vaginal hysterectomy 01/22/2021   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/09/2020   Bilateral leg pain 11/03/2019   Iron  deficiency anemia 10/15/2019   Bilateral arm pain 08/05/2019   Bilateral hand numbness 08/05/2019   Weakness of both hands 08/05/2019   Chronic musculoskeletal pain 07/19/2019   Chronic neuropathic pain 07/19/2019   Lumbar L4-5  IVDD (Right) 06/29/2019   Special screening for malignant neoplasms, colon    Polyp of sigmoid colon    Hemorrhoids    Trigeminal neuralgia 10/05/2018   Lumbar radiculitis (Right) 09/24/2018   Hypocalcemia 06/03/2018   Vitamin D  insufficiency 06/03/2018   Abnormal MRI, lumbar spine (06/02/2020) 06/03/2018   Chronic hip pain (Right) 06/03/2018   Spondylosis without myelopathy or radiculopathy, lumbar region 06/03/2018   DDD (degenerative disc disease), lumbar 06/02/2018   Lumbar facet hypertrophy 06/02/2018   Lumbar facet arthropathy 06/02/2018   Lumbar facet syndrome (Bilateral) (R>L) 06/02/2018   Marijuana use 05/19/2018   Chronic lower extremity pain (1ry area of Pain) (Bilateral) (R>L) 05/13/2018   Chronic low back pain (2ry area of Pain) (Bilateral) (R>L) w/ sciatica (Bilateral) 05/13/2018   Chronic pain syndrome 05/13/2018   Disorder of skeletal system 05/13/2018   Pharmacologic therapy 05/13/2018   Problems influencing health status 05/13/2018   Mastalgia 05/05/2018   Breast mass, right 01/07/2018   Face pain 12/11/2017   Tingling 12/11/2017   Thoracic aortic atherosclerosis 08/20/2017   Emphysema of lung (HCC) 08/20/2017   Adductor tendinitis 04/23/2017  Trochanteric bursitis of right hip 04/23/2017   GERD without esophagitis 12/11/2016   CLL (chronic lymphocytic leukemia) (HCC) 11/24/2016   Stress incontinence 09/04/2016   B12 deficiency 07/10/2015   Insomnia 07/10/2015   Anorexia nervosa, restricting type (HCC) 07/10/2015   Anxiety, generalized 07/10/2015   H/O suicide attempt 07/10/2015   Lymphocytosis 07/10/2015   Dysplasia of cervix, low grade (CIN 1) 07/10/2015   Obsessive-compulsive disorder 07/10/2015   Tobacco use 07/10/2015   History of cervical dysplasia 07/18/2014    Family History  Problem Relation Age of Onset   Depression Mother    Cancer Mother        thyroid   Alcohol abuse Father    COPD Father    Alcohol abuse Brother    Depression  Brother    Bipolar disorder Brother    Suicidality Brother    ADD / ADHD Son    Breast cancer Neg Hx     Social History   Tobacco Use   Smoking status: Every Day    Current packs/day: 1.50    Types: Cigarettes   Smokeless tobacco: Never  Substance Use Topics   Alcohol use: Not Currently    Alcohol/week: 0.0 standard drinks of alcohol    Comment: rarely    Allergies  Allergen Reactions   Doxycycline  Rash    Rash on cheeks and sides of nose    Duloxetine  Rash    Current Meds  Medication Sig   albuterol  (VENTOLIN  HFA) 108 (90 Base) MCG/ACT inhaler Inhale 2 puffs into the lungs every 6 (six) hours as needed for wheezing or shortness of breath.   alendronate  (FOSAMAX ) 70 MG tablet Take 1 tablet (70 mg total) by mouth every 7 (seven) days. Take with a full glass of water on an empty stomach.   allopurinol  (ZYLOPRIM ) 300 MG tablet Take 300 mg by mouth daily.   ARIPiprazole  (ABILIFY ) 5 MG tablet Take 1 tablet (5 mg total) by mouth daily.   ASPIRIN  81 PO Take 81 mg by mouth daily.   atorvastatin  (LIPITOR) 10 MG tablet Take 1 tablet (10 mg total) by mouth daily.   [EXPIRED] azithromycin  (ZITHROMAX ) 250 MG tablet Take 250 mg by mouth daily.   benzonatate  (TESSALON ) 100 MG capsule Take 1-2 capsules (100-200 mg total) by mouth 3 (three) times daily as needed for cough.   Ca Phosphate-Cholecalciferol (CALTRATE GUMMY BITES) 250-10 MG-MCG CHEW Chew 1 gummy by mouth 2 (two) times daily.   estradiol  (ESTRACE ) 0.5 MG tablet Take 1 tablet (0.5 mg total) by mouth daily.   famotidine  (PEPCID ) 10 MG tablet Take by mouth.   levocetirizine (XYZAL ) 5 MG tablet Take 1 tablet (5 mg total) by mouth every evening.   MAGNESIUM  PO Take by mouth.   nortriptyline  (PAMELOR ) 10 MG capsule Take 2 capsules (20 mg total) by mouth daily.   omeprazole  (PRILOSEC) 20 MG capsule Take 1 capsule (20 mg total) by mouth in the morning.   pirtobrutinib (JAYPIRCA) 100 MG tablet Take 200 mg by mouth daily.   pregabalin   (LYRICA ) 150 MG capsule Take 1 capsule (150 mg total) by mouth 3 (three) times daily.   tiZANidine  (ZANAFLEX ) 2 MG tablet Take 1 tablet (2 mg total) by mouth at bedtime.   umeclidinium-vilanterol (ANORO ELLIPTA ) 62.5-25 MCG/ACT AEPB Inhale 1 puff into the lungs daily.   vitamin B-12 (CYANOCOBALAMIN ) 500 MCG tablet Take 250 mcg by mouth every morning.   VITAMIN D  PO Take by mouth.    Immunization History  Administered Date(s) Administered  Influenza,inj,Quad PF,6+ Mos 07/07/2014, 07/09/2019, 10/26/2020, 06/13/2021, 10/15/2022   Influenza-Unspecified 07/07/2014   Pneumococcal Polysaccharide-23 07/07/2014   Tdap 05/04/2014, 05/19/2021   Zoster Recombinant(Shingrix ) 10/26/2020, 10/15/2022        Objective:     BP 110/86   Pulse 94   Temp 97.7 F (36.5 C) (Temporal)   Ht 5' 7 (1.702 m)   Wt 171 lb 12.8 oz (77.9 kg)   LMP 08/17/2016   SpO2 97%   BMI 26.91 kg/m   SpO2: 97 %  GENERAL: Overweight well-developed woman, presents in transport chair.  No conversational dyspnea. HEAD: Normocephalic, atraumatic.  EYES: Pupils equal, round, reactive to light.  No scleral icterus.  MOUTH: Poor dentition, oral mucosa moist.  No thrush. NECK: Supple. No thyromegaly. Trachea midline. No JVD.  No adenopathy. PULMONARY: Good air entry bilaterally.  Diminished breath sounds on the right.  No wheezes or rhonchi CARDIOVASCULAR: S1 and S2. Regular rate and rhythm.  No rubs, murmurs or gallops heard. ABDOMEN: Benign. MUSCULOSKELETAL: No joint deformity, no clubbing, no edema.  NEUROLOGIC: No overt focal deficit, no gait disturbance, speech is fluent. SKIN: Intact,warm,dry.  Multiple petechiae particularly on the chest and upper arms. PSYCH: Mood and behavior normal.  Point-of-care ultrasound performed: There is a simple right pleural effusion noted which is at least moderate.      Assessment & Plan:     ICD-10-CM   1. Recurrent pleural effusion on right  J90 US  THORACENTESIS ASP PLEURAL  SPACE W/IMG GUIDE    2. CLL (chronic lymphocytic leukemia) (HCC)  C91.10 US  THORACENTESIS ASP PLEURAL SPACE W/IMG GUIDE    3. Tobacco dependence due to cigarettes  F17.210       Orders Placed This Encounter  Procedures   US  THORACENTESIS ASP PLEURAL SPACE W/IMG GUIDE    Standing Status:   Future    Number of Occurrences:   1    Expected Date:   06/25/2024    Expiration Date:   06/25/2025    Are labs required for specimen collection?:   Yes    Lab orders requested (DO NOT place separate lab orders, these will be automatically ordered during procedure specimen collection)::   Body Fluid Cell Count w/ Differential    Lab orders requested (DO NOT place separate lab orders, these will be automatically ordered during procedure specimen collection)::   Glucose-Pleural Or Peritoneal Fluid    Lab orders requested (DO NOT place separate lab orders, these will be automatically ordered during procedure specimen collection)::   Lactate Dehydrogenase-Body Fluid    Lab orders requested (DO NOT place separate lab orders, these will be automatically ordered during procedure specimen collection)::   Protein-Pleural Or Peritoneal Fluid    Lab orders requested (DO NOT place separate lab orders, these will be automatically ordered during procedure specimen collection)::   Oncology    Oncology specific labs::   Cytology - Non Pap    Oncology specific labs::   Flow Cytometry (Leukemia/Lymphoma)    Reason for Exam (SYMPTOM  OR DIAGNOSIS REQUIRED):   Pleural Effusion on right    Preferred imaging location?:   [facility]    Discussion:    Right pleural effusion in the setting of chronic lymphocytic leukemia Recurrent right pleural effusion associated with chronic lymphocytic leukemia, causing dyspnea on exertion and easy fatigability. Effusion has been tapped twice recently with significant fluid removal. - Arrange thoracentesis at the medical mall with radiology assistance today. - Hopefully patient  will respond to current chemotherapy. - May require  PleurX catheter.  Thrombocytopenia Thrombocytopenia poses a risk for bleeding during procedures such as thoracentesis. - Order complete blood count to assess platelet levels before thoracentesis.  Tobacco use Current smoker, reduced from one and a half packs per day to half a pack per day.  Patient counseled with regards to discontinuation of smoking.    Will see the patient in follow-up in 3 weeks time she is to contact us  prior to that time should any new difficulties arise.  Advised if symptoms do not improve or worsen, to please contact office for sooner follow up or seek emergency care.    I spent 53 minutes of dedicated to the care of this patient on the date of this encounter to include pre-visit review of records, face-to-face time with the patient discussing conditions above, post visit ordering of testing, clinical documentation with the electronic health record, making appropriate referrals as documented, and communicating necessary findings to members of the patients care team.   C. Leita Sanders, MD Advanced Bronchoscopy PCCM [facility]    *This note was dictated using voice recognition software/Dragon.  Despite best efforts to proofread, errors can occur which can change the meaning. Any transcriptional errors that result from this process are unintentional and may not be fully corrected at the time of dictation.

## 2024-06-25 NOTE — Procedures (Signed)
 PROCEDURE SUMMARY:  Successful US  guided right thoracentesis. Yielded 1.45L of cloudy amber fluid. Patient tolerated procedure well. No immediate complications. EBL = trace  Specimen sent for labs.  Post procedure chest X-ray reveals no pneumothorax  Monique [name] Bal PA-C 06/25/2024 12:58 PM

## 2024-06-29 LAB — COMP PANEL: LEUKEMIA/LYMPHOMA: Immunophenotypic Profile: 71

## 2024-06-30 ENCOUNTER — Ambulatory Visit: Admitting: Physical Therapy

## 2024-06-30 ENCOUNTER — Ambulatory Visit: Payer: Self-pay | Admitting: Pulmonary Disease

## 2024-06-30 DIAGNOSIS — R2681 Unsteadiness on feet: Secondary | ICD-10-CM

## 2024-06-30 DIAGNOSIS — R269 Unspecified abnormalities of gait and mobility: Secondary | ICD-10-CM

## 2024-06-30 DIAGNOSIS — M6281 Muscle weakness (generalized): Secondary | ICD-10-CM

## 2024-06-30 DIAGNOSIS — R2689 Other abnormalities of gait and mobility: Secondary | ICD-10-CM

## 2024-06-30 DIAGNOSIS — R262 Difficulty in walking, not elsewhere classified: Secondary | ICD-10-CM

## 2024-06-30 NOTE — Therapy (Addendum)
 OUTPATIENT PHYSICAL THERAPY LOWER EXTREMITY TREATMENT/    Patient Name: Monique [name] MRN: [id number] DOB:[birth year], 53 y.o., female Today's Date: 06/30/2024  END OF SESSION:  PT End of Session - 06/30/24 0933     Visit Number 5    Number of Visits 16    Date for Recertification  07/29/24    Progress Note Due on Visit 10    PT Start Time 0933    PT Stop Time 1013    PT Time Calculation (min) 40 min    Activity Tolerance Patient limited by pain    Behavior During Therapy [facility] for tasks assessed/performed            Past Medical History:  Diagnosis Date   Anxiety    Bursitis    leg pain   Carpal tunnel syndrome    Cervical dysplasia    hx LEEP over 18 years ago.    Chronic lymphocytic leukemia (HCC) 2018   Dr Jacobo.  (in lymph nodes)   COPD (chronic obstructive pulmonary disease) (HCC)    COVID-19 2021   Depression    Eating disorder    GERD (gastroesophageal reflux disease)    History of self-harm    Insomnia    Obsession    Tobacco abuse    Vitamin B12 deficiency (non anemic)    Past Surgical History:  Procedure Laterality Date   BREAST BIOPSY Right 01/05/2018   US  guided biopsy of 2 areas and 1 lymph node, MIXED INFLAMMATION AND GIANT CELL REACTION   CERVICAL BIOPSY  W/ LOOP ELECTRODE EXCISION     COLONOSCOPY WITH PROPOFOL  N/A 04/21/2019   Procedure: COLONOSCOPY WITH PROPOFOL ;  Surgeon: Janalyn Keene NOVAK, MD;  Location: [facility] ENDOSCOPY;  Service: Gastroenterology;  Laterality: N/A;   IR BONE MARROW BIOPSY & ASPIRATION  04/26/2024   OTHER SURGICAL HISTORY     scar tissue removed from vocal cords   TUBAL LIGATION     VAGINAL HYSTERECTOMY N/A 01/22/2021   Procedure: HYSTERECTOMY VAGINAL; BILATERAL SALPINGECTOMY;  Surgeon: Connell Davies, MD;  Location: [facility] ORS;  Service: Gynecology;  Laterality: N/A;   vocal cord surgery  2005   Patient Active Problem List   Diagnosis Date Noted   Stage 3a chronic kidney disease (HCC) 10/02/2023   Senile purpura  10/02/2023   Major depression in remission 10/02/2023   Pancytopenia (HCC) 10/02/2023   Post-menopause on HRT (hormone replacement therapy) 11/08/2022   At risk for sleep apnea 09/19/2021   Lumbar foraminal stenosis (L4-5) (Bilateral) (R>L) 06/05/2021   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 06/05/2021   Lumbosacral radiculopathy/radiculitis at L5 (Bilateral) 06/05/2021   PAD (peripheral artery disease) 02/14/2021   CIN II (cervical intraepithelial neoplasia II) 02/01/2021   S/P vaginal hysterectomy 01/22/2021   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/09/2020   Bilateral leg pain 11/03/2019   Iron  deficiency anemia 10/15/2019   Bilateral arm pain 08/05/2019   Bilateral hand numbness 08/05/2019   Weakness of both hands 08/05/2019   Chronic musculoskeletal pain 07/19/2019   Chronic neuropathic pain 07/19/2019   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   Special screening for malignant neoplasms, colon    Polyp of sigmoid colon    Hemorrhoids    Trigeminal neuralgia 10/05/2018   Lumbar radiculitis (Right) 09/24/2018   Hypocalcemia 06/03/2018   Vitamin D  insufficiency 06/03/2018   Abnormal MRI, lumbar spine (06/02/2020) 06/03/2018   Chronic hip pain (Right) 06/03/2018   Spondylosis without myelopathy or radiculopathy, lumbar region 06/03/2018   DDD (degenerative disc  disease), lumbar 06/02/2018   Lumbar facet hypertrophy 06/02/2018   Lumbar facet arthropathy 06/02/2018   Lumbar facet syndrome (Bilateral) (R>L) 06/02/2018   Marijuana use 05/19/2018   Chronic lower extremity pain (1ry area of Pain) (Bilateral) (R>L) 05/13/2018   Chronic low back pain (2ry area of Pain) (Bilateral) (R>L) w/ sciatica (Bilateral) 05/13/2018   Chronic pain syndrome 05/13/2018   Disorder of skeletal system 05/13/2018   Pharmacologic therapy 05/13/2018   Problems influencing health status 05/13/2018   Mastalgia 05/05/2018   Breast mass, right 01/07/2018   Face pain 12/11/2017    Tingling 12/11/2017   Thoracic aortic atherosclerosis 08/20/2017   Emphysema of lung (HCC) 08/20/2017   Adductor tendinitis 04/23/2017   Trochanteric bursitis of right hip 04/23/2017   GERD without esophagitis 12/11/2016   CLL (chronic lymphocytic leukemia) (HCC) 11/24/2016   Stress incontinence 09/04/2016   B12 deficiency 07/10/2015   Insomnia 07/10/2015   Anorexia nervosa, restricting type (HCC) 07/10/2015   Anxiety, generalized 07/10/2015   H/O suicide attempt 07/10/2015   Lymphocytosis 07/10/2015   Dysplasia of cervix, low grade (CIN 1) 07/10/2015   Obsessive-compulsive disorder 07/10/2015   Tobacco use 07/10/2015   History of cervical dysplasia 07/18/2014    PCP: Glenard Mire, MD   REFERRING PROVIDER: Sowles, Krichna, MD   REFERRING DIAG: M79.18 (ICD-10-CM) - Pain of thigh muscle   THERAPY DIAG:  Abnormality of gait and mobility  Unsteadiness on feet  Difficulty in walking, not elsewhere classified  Muscle weakness (generalized)  Other abnormalities of gait and mobility  Rationale for Evaluation and Treatment: Rehabilitation  ONSET DATE: 05/03/24  SUBJECTIVE:   SUBJECTIVE STATEMENT:    Today: Pt has continued pain in L hip, continued pain with mobility.  States that soreness is rated 7-8/10. Pt reports that she will be starting new trial medication for CLL in December.  Once treatment starts it will be a daily regiment in [place].    From eval: Pt has significant discomfort in her left ant thigh in the inguinal crease. Pain radiates down the leg from there. Pt also has neuropathy on the R thigh. Pain on the left began about 1 month ago. She went to PCP, got some numbing cream. Pt is still considering options for seeing MD but is awaiting for PT recommendations. Pt does not recall when the pain began. She does know that rolling in bed causes a lot of pain.   PERTINENT HISTORY: Past medical history of bursitis, anxiety, chronic lymphocytic leukemia,  COPD, depression, and vitamin B12 deficiency. PAIN:  Are you having pain? Yes: NPRS scale: 10/10 walking, no pain sitting, 10/10 rolling over in bed  Pain location: left ant hip along inguinal crease  Pain description: sharp, radiating  Aggravating factors: walking, rolling over in bed  Relieving factors: sitting, rest   PRECAUTIONS: Fall  RED FLAGS: Known lymphocytic leukemia    WEIGHT BEARING RESTRICTIONS: No  FALLS:  Has patient fallen in last 6 months? No  LIVING ENVIRONMENT: Lives with: lives alone Lives in: House/apartment Stairs: Yes: External: 3 steps; bilateral but cannot reach both Has following equipment at home: Single point cane  OCCUPATION: n/a  PLOF: Independent  PATIENT GOALS: improve pain, mobility, and function   NEXT MD VISIT: Not scheduled for this problem yet.   OBJECTIVE:  Note: Objective measures were completed at Evaluation unless otherwise noted.  DIAGNOSTIC FINDINGS: no images yet   PATIENT SURVEYS:   LEFS  Extreme difficulty/unable (0), Quite a bit of difficulty (1), Moderate difficulty (2), Little  difficulty (3), No difficulty (4) Survey date:    Any of your usual work, housework or school activities 0  2. Usual hobbies, recreational or sporting activities 0  3. Getting into/out of the bath 0  4. Walking between rooms 0  5. Putting on socks/shoes 0  6. Squatting  0  7. Lifting an object, like a bag of groceries from the floor 0  8. Performing light activities around your home 0  9. Performing heavy activities around your home 0  10. Getting into/out of a car 0  11. Walking 2 blocks 0  12. Walking 1 mile 0  13. Going up/down 10 stairs (1 flight) 0  14. Standing for 1 hour 0  15.  sitting for 1 hour 0  16. Running on even ground 0  17. Running on uneven ground 0  18. Making sharp turns while running fast 0  19. Hopping  0  20. Rolling over in bed 0  Score total:  0     COGNITION: Overall cognitive status: Within functional  limits for tasks assessed     SENSATION: Not tested  POSTURE: rounded shoulders, forward head, and flexed trunk   PALPATION: Tender to palpation along inguinal crease, quad and hip flexor musculature   LOWER EXTREMITY ROM:  Pain with FABER  LOWER EXTREMITY MMT:  MMT Right eval Left eval  Hip flexion 4+ 3+  Hip extension    Hip abduction 4+ 3+  Hip adduction 4+ 3+  Hip internal rotation    Hip external rotation    Knee flexion 4+ 4  Knee extension 4+ 4  Ankle dorsiflexion 4+ 4  Ankle plantarflexion    Ankle inversion    Ankle eversion     (Blank rows = not tested)  LOWER EXTREMITY SPECIAL TESTS:  Hip special tests: Belvie (FABER) test: positive , Thomas test: Not tested, Hip scouring test: positive , and Piriformis test: positive     GAIT: Distance walked: 5 ft with SPC- flexed trunk, flexed knees, decreased step leng B  Assistive device utilized: Single point cane Level of assistance: Modified independence                                                                                                                                 TREATMENT DATE: 06/30/24    TE- To improve strength, endurance, mobility, and function of specific targeted muscle groups or improve joint range of motion or improve muscle flexibility  Nustep AAROM level 2 x 7 min with cues to remain in pain free Range onteh R hip. No increased pain reported by pt. Hot pack sustained on hip throughout AAROM   SpO2 assessment: 96% HR 97  Isometric hip adduction x 12 bil  SAQ without resistance x 3 pt reports exercise as easy added 2.5# AW BLE x 10, increased difficulty byt still rated easy.    Heel slide x 12 bil 2.5# AW  Partial bridge  x 8 bil   Stand pivot tranfers to and from nustep, chair and mat table with supervision assist and UE supported on SPC.   Sit<>supine without assist on this day  Gait with SPC x 150ft on this day. Mild antalgia but improved posture and step length noted.  Reports only pulling sensation in the hip with extension.   Manual:  STM with TP release to TFL and quadriceps as x 10 min     PATIENT EDUCATION: Education details: . HHPT vs OPPT given new start of chemotherapy treatment. Need for possible orthopedic referral if hip pain does not improve.  Person educated: Patient Education method: Explanation Education comprehension: verbalized understanding   HOME EXERCISE PROGRAM: Access Code: [email or web address] URL: [email or web address] Date: 06/07/2024 Prepared by: Lonni Gainer  Exercises - Supine Transversus Abdominis Bracing - Hands on Stomach  - 1 x daily - 7 x weekly - 2 sets - 10 reps - 3 sec hold - Supine Gluteal Sets  - 1 x daily - 7 x weekly - 2 sets - 10 reps - 3 sec hold   ASSESSMENT:  CLINICAL IMPRESSION:   Patient is a 53 year old female presented physical therapy with significant pain in her left inguinal crease rating it into her iliopsoas and rectus femoris muscular region. Pt was recently seen in hospital for plural effusion d/t CLL. Has now re-started CA treatment. With new CA treatment regiment starting soon, pt would benefit from transition to HHPT for management of hip pain and activity tolerance. Pt will request HHPT referral from oncology, and d/c when HHPT course begins.   PT treatment focused on AAROM and BLE strengthening as well as pain management with STM. Pt is noted to tolerate increased resistance with out significant increase in pain as well as gait for ~13ft with SPC and only mild/moderate pulling sensation the anterior hip with extension.   Will benefit from skilled PT at this time until transition to HHPT or orthopedic consult.   OBJECTIVE IMPAIRMENTS: Abnormal gait, decreased activity tolerance, difficulty walking, decreased ROM, decreased strength, impaired perceived functional ability, and pain.   ACTIVITY LIMITATIONS: carrying, lifting, squatting, stairs, bed mobility, and locomotion  level  PARTICIPATION LIMITATIONS: driving, community activity, and yard work  PERSONAL FACTORS: Age, Time since onset of injury/illness/exacerbation, and 3+ comorbidities: bursitis, anxiety, chronic lymphocytic leukemia, COPD, depression, and vitamin B12 deficiency are also affecting patient's functional outcome.   REHAB POTENTIAL: Good  CLINICAL DECISION MAKING: Evolving/moderate complexity  EVALUATION COMPLEXITY: Moderate   GOALS: Goals reviewed with patient? Yes  SHORT TERM GOALS: Target date: 06/17/2024      Patient will be independent in home exercise program to improve strength/mobility for better functional independence with ADLs. Baseline: No HEP currently  Goal status: INITIAL   LONG TERM GOALS: Target date: 07/15/2024    Patient will improve lower extremity functional scale by 25 points or greater in order to indicate statistically significant improvement in lower extremity function. Baseline: 0/80 Goal status: INITIAL  2.  Pt will improve L hip flexion and ABD strength to 4+/5 or greater to demonstrate improved L hip muscle function without pain.   Baseline: see eval chart  Goal status: INITIAL  3.  Pt will improve 5XSTS test by 5 sec or more to indicate improved LE strength and power  Baseline: 27.6sec 10/7 Goal status: INITIAL   PLAN:  PT FREQUENCY: 1-2x/week  PT DURATION: 6 weeks  PLANNED INTERVENTIONS: [id number]- Physical Performance Testing, 97110-Therapeutic exercises, 97530- Therapeutic activity, [id number]- Neuromuscular re-education, 97535- Self Care, [id number]-  Manual therapy, [phone]- Gait training, Patient/Family education, Balance training, Joint mobilization, Cryotherapy, and Moist heat  PLAN FOR NEXT SESSION:   Continue  heat and nustep for pain relief and LE strength, long arc distraction L hip, gluteal and core strength as indicate;   Follow up on HHPT referral from oncology.  If no improvement within next 2-3 weeks visits seek orthopaedic referral  for further workup; already instructed pt she should seek orthopaedic referral at eval.    Massie FORBES Dollar, PT 06/30/2024, 9:33 AM

## 2024-07-01 ENCOUNTER — Encounter: Payer: Self-pay | Admitting: Pulmonary Disease

## 2024-07-01 DIAGNOSIS — Z008 Encounter for other general examination: Secondary | ICD-10-CM | POA: Diagnosis not present

## 2024-07-01 DIAGNOSIS — S73102S Unspecified sprain of left hip, sequela: Secondary | ICD-10-CM | POA: Diagnosis not present

## 2024-07-01 LAB — CYTOLOGY - NON PAP

## 2024-07-02 ENCOUNTER — Ambulatory Visit: Admitting: Physical Therapy

## 2024-07-02 DIAGNOSIS — R269 Unspecified abnormalities of gait and mobility: Secondary | ICD-10-CM

## 2024-07-02 DIAGNOSIS — R2689 Other abnormalities of gait and mobility: Secondary | ICD-10-CM

## 2024-07-02 DIAGNOSIS — R2681 Unsteadiness on feet: Secondary | ICD-10-CM

## 2024-07-02 DIAGNOSIS — R262 Difficulty in walking, not elsewhere classified: Secondary | ICD-10-CM

## 2024-07-02 DIAGNOSIS — M6281 Muscle weakness (generalized): Secondary | ICD-10-CM

## 2024-07-02 NOTE — Therapy (Signed)
 OUTPATIENT PHYSICAL THERAPY LOWER EXTREMITY TREATMENT/    Patient Name: Monique [name] MRN: [id number] DOB:28-Sep-1970, 53 y.o., female Today's Date: 07/02/2024  END OF SESSION:  PT End of Session - 07/02/24 0817     Visit Number 6    Number of Visits 16    Date for Recertification  07/29/24    Progress Note Due on Visit 10    PT Start Time 0804    PT Stop Time 0845    PT Time Calculation (min) 41 min    Activity Tolerance Patient limited by pain    Behavior During Therapy [facility] for tasks assessed/performed            Past Medical History:  Diagnosis Date   Anxiety    Bursitis    leg pain   Carpal tunnel syndrome    Cervical dysplasia    hx LEEP over 18 years ago.    Chronic lymphocytic leukemia (HCC) 2018   Dr Jacobo.  (in lymph nodes)   COPD (chronic obstructive pulmonary disease) (HCC)    COVID-19 2021   Depression    Eating disorder    GERD (gastroesophageal reflux disease)    History of self-harm    Insomnia    Obsession    Tobacco abuse    Vitamin B12 deficiency (non anemic)    Past Surgical History:  Procedure Laterality Date   BREAST BIOPSY Right 01/05/2018   US  guided biopsy of 2 areas and 1 lymph node, MIXED INFLAMMATION AND GIANT CELL REACTION   CERVICAL BIOPSY  W/ LOOP ELECTRODE EXCISION     COLONOSCOPY WITH PROPOFOL  N/A 04/21/2019   Procedure: COLONOSCOPY WITH PROPOFOL ;  Surgeon: Janalyn Keene NOVAK, MD;  Location: [facility] ENDOSCOPY;  Service: Gastroenterology;  Laterality: N/A;   IR BONE MARROW BIOPSY & ASPIRATION  04/26/2024   OTHER SURGICAL HISTORY     scar tissue removed from vocal cords   TUBAL LIGATION     VAGINAL HYSTERECTOMY N/A 01/22/2021   Procedure: HYSTERECTOMY VAGINAL; BILATERAL SALPINGECTOMY;  Surgeon: Connell Davies, MD;  Location: [facility] ORS;  Service: Gynecology;  Laterality: N/A;   vocal cord surgery  2005   Patient Active Problem List   Diagnosis Date Noted   Stage 3a chronic kidney disease (HCC) 10/02/2023   Senile purpura  10/02/2023   Major depression in remission 10/02/2023   Pancytopenia (HCC) 10/02/2023   Post-menopause on HRT (hormone replacement therapy) 11/08/2022   At risk for sleep apnea 09/19/2021   Lumbar foraminal stenosis (L4-5) (Bilateral) (R>L) 06/05/2021   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 06/05/2021   Lumbosacral radiculopathy/radiculitis at L5 (Bilateral) 06/05/2021   PAD (peripheral artery disease) 02/14/2021   CIN II (cervical intraepithelial neoplasia II) 02/01/2021   S/P vaginal hysterectomy 01/22/2021   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/09/2020   Bilateral leg pain 11/03/2019   Iron  deficiency anemia 10/15/2019   Bilateral arm pain 08/05/2019   Bilateral hand numbness 08/05/2019   Weakness of both hands 08/05/2019   Chronic musculoskeletal pain 07/19/2019   Chronic neuropathic pain 07/19/2019   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   Special screening for malignant neoplasms, colon    Polyp of sigmoid colon    Hemorrhoids    Trigeminal neuralgia 10/05/2018   Lumbar radiculitis (Right) 09/24/2018   Hypocalcemia 06/03/2018   Vitamin D  insufficiency 06/03/2018   Abnormal MRI, lumbar spine (06/02/2020) 06/03/2018   Chronic hip pain (Right) 06/03/2018   Spondylosis without myelopathy or radiculopathy, lumbar region 06/03/2018   DDD (degenerative disc  disease), lumbar 06/02/2018   Lumbar facet hypertrophy 06/02/2018   Lumbar facet arthropathy 06/02/2018   Lumbar facet syndrome (Bilateral) (R>L) 06/02/2018   Marijuana use 05/19/2018   Chronic lower extremity pain (1ry area of Pain) (Bilateral) (R>L) 05/13/2018   Chronic low back pain (2ry area of Pain) (Bilateral) (R>L) w/ sciatica (Bilateral) 05/13/2018   Chronic pain syndrome 05/13/2018   Disorder of skeletal system 05/13/2018   Pharmacologic therapy 05/13/2018   Problems influencing health status 05/13/2018   Mastalgia 05/05/2018   Breast mass, right 01/07/2018   Face pain 12/11/2017    Tingling 12/11/2017   Thoracic aortic atherosclerosis 08/20/2017   Emphysema of lung (HCC) 08/20/2017   Adductor tendinitis 04/23/2017   Trochanteric bursitis of right hip 04/23/2017   GERD without esophagitis 12/11/2016   CLL (chronic lymphocytic leukemia) (HCC) 11/24/2016   Stress incontinence 09/04/2016   B12 deficiency 07/10/2015   Insomnia 07/10/2015   Anorexia nervosa, restricting type (HCC) 07/10/2015   Anxiety, generalized 07/10/2015   H/O suicide attempt 07/10/2015   Lymphocytosis 07/10/2015   Dysplasia of cervix, low grade (CIN 1) 07/10/2015   Obsessive-compulsive disorder 07/10/2015   Tobacco use 07/10/2015   History of cervical dysplasia 07/18/2014    PCP: Glenard Mire, MD   REFERRING PROVIDER: Sowles, Krichna, MD   REFERRING DIAG: M79.18 (ICD-10-CM) - Pain of thigh muscle   THERAPY DIAG:  Abnormality of gait and mobility  Unsteadiness on feet  Difficulty in walking, not elsewhere classified  Muscle weakness (generalized)  Other abnormalities of gait and mobility  Rationale for Evaluation and Treatment: Rehabilitation  ONSET DATE: 05/03/24  SUBJECTIVE:   SUBJECTIVE STATEMENT:    Today: pt states that hip is feeling a little better aver the last could days, with moments of no pain while ambulating. Reports that pain in 5/10 with ambulation on this day.   Spoke with MD and insurance about switching from  OPPT to HHPT. Referral has been sent, waiting on HHPT to set up evaluation.   Pt reports that she will be starting new trial medication for CLL in December.  Once treatment starts it will be a daily regiment in [place].    From eval: Pt has significant discomfort in her left ant thigh in the inguinal crease. Pain radiates down the leg from there. Pt also has neuropathy on the R thigh. Pain on the left began about 1 month ago. She went to PCP, got some numbing cream. Pt is still considering options for seeing MD but is awaiting for PT  recommendations. Pt does not recall when the pain began. She does know that rolling in bed causes a lot of pain.   PERTINENT HISTORY: Past medical history of bursitis, anxiety, chronic lymphocytic leukemia, COPD, depression, and vitamin B12 deficiency. PAIN:  Are you having pain? Yes: NPRS scale: 10/10 walking, no pain sitting, 10/10 rolling over in bed  Pain location: left ant hip along inguinal crease  Pain description: sharp, radiating  Aggravating factors: walking, rolling over in bed  Relieving factors: sitting, rest   PRECAUTIONS: Fall  RED FLAGS: Known lymphocytic leukemia    WEIGHT BEARING RESTRICTIONS: No  FALLS:  Has patient fallen in last 6 months? No  LIVING ENVIRONMENT: Lives with: lives alone Lives in: House/apartment Stairs: Yes: External: 3 steps; bilateral but cannot reach both Has following equipment at home: Single point cane  OCCUPATION: n/a  PLOF: Independent  PATIENT GOALS: improve pain, mobility, and function   NEXT MD VISIT: Not scheduled for this problem yet.  OBJECTIVE:  Note: Objective measures were completed at Evaluation unless otherwise noted.  DIAGNOSTIC FINDINGS: no images yet   PATIENT SURVEYS:   LEFS  Extreme difficulty/unable (0), Quite a bit of difficulty (1), Moderate difficulty (2), Little difficulty (3), No difficulty (4) Survey date:    Any of your usual work, housework or school activities 0  2. Usual hobbies, recreational or sporting activities 0  3. Getting into/out of the bath 0  4. Walking between rooms 0  5. Putting on socks/shoes 0  6. Squatting  0  7. Lifting an object, like a bag of groceries from the floor 0  8. Performing light activities around your home 0  9. Performing heavy activities around your home 0  10. Getting into/out of a car 0  11. Walking 2 blocks 0  12. Walking 1 mile 0  13. Going up/down 10 stairs (1 flight) 0  14. Standing for 1 hour 0  15.  sitting for 1 hour 0  16. Running on even ground  0  17. Running on uneven ground 0  18. Making sharp turns while running fast 0  19. Hopping  0  20. Rolling over in bed 0  Score total:  0     COGNITION: Overall cognitive status: Within functional limits for tasks assessed     SENSATION: Not tested  POSTURE: rounded shoulders, forward head, and flexed trunk   PALPATION: Tender to palpation along inguinal crease, quad and hip flexor musculature   LOWER EXTREMITY ROM:  Pain with FABER  LOWER EXTREMITY MMT:  MMT Right eval Left eval  Hip flexion 4+ 3+  Hip extension    Hip abduction 4+ 3+  Hip adduction 4+ 3+  Hip internal rotation    Hip external rotation    Knee flexion 4+ 4  Knee extension 4+ 4  Ankle dorsiflexion 4+ 4  Ankle plantarflexion    Ankle inversion    Ankle eversion     (Blank rows = not tested)  LOWER EXTREMITY SPECIAL TESTS:  Hip special tests: Belvie (FABER) test: positive , Thomas test: Not tested, Hip scouring test: positive , and Piriformis test: positive     GAIT: Distance walked: 5 ft with SPC- flexed trunk, flexed knees, decreased step leng B  Assistive device utilized: Single point cane Level of assistance: Modified independence                                                                                                                                 TREATMENT DATE: 07/02/24    TE- To improve strength, endurance, mobility, and function of specific targeted muscle groups or improve joint range of motion or improve muscle flexibility  Nustep AAROM double hill program level 1-5 x 8 min with cues to remain in pain free Range on the R hip. No increased pain reported by pt. Hot pack sustained on hip throughout AAROM    Hot pack applied on  supine.   Isometric hip adduction x 15 bil  SAQ without resistance with Heel slide x 15  Partial bridge with adduction x 8 bil  Hip abduction x 20 with RTB LTR x 15 bil in pain free ROM.    Stand pivot tranfers to and from nustep, chair and  mat table with supervision assist and UE supported on SPC.   Sit<>supine without assist on this day  Gait with SPC 2x 32ft on this day. Mild antalgia but improved posture and step length noted. Reports only pulling sensation in the hip with extension.   Manual:  STM with TP release to TFL and quadriceps as well as gluteal complex x 10 min  Reports increased greater troch pain following STM to gluteals.   PATIENT EDUCATION: Education details: . HHPT vs OPPT given new start of chemotherapy treatment. Need for possible orthopedic referral if hip pain does not improve.  Person educated: Patient Education method: Explanation Education comprehension: verbalized understanding   HOME EXERCISE PROGRAM: Access Code: [email or web address] URL: [email or web address] Date: 06/07/2024 Prepared by: Lonni Gainer  Exercises - Supine Transversus Abdominis Bracing - Hands on Stomach  - 1 x daily - 7 x weekly - 2 sets - 10 reps - 3 sec hold - Supine Gluteal Sets  - 1 x daily - 7 x weekly - 2 sets - 10 reps - 3 sec hold   ASSESSMENT:  CLINICAL IMPRESSION:   Patient is a 53 year old female presented physical therapy with significant pain in her left inguinal crease rating it into her iliopsoas and rectus femoris muscular region. Pt was recently seen in hospital for plural effusion d/t CLL. Has now re-started CA treatment. With new CA treatment regiment starting soon, pt would benefit from transition to HHPT for management of hip pain and activity tolerance. Pt will request HHPT referral from oncology, and d/c when HHPT course begins.   PT treatment focused on AAROM and BLE strengthening as well as pain management with STM. Pt is noted to tolerate increased resistance with out significant increase in pain as well as gait for ~30ft with SPC and only mild/moderate pulling sensation the anterior hip with extension. Mild pain folloow gluteal STM at greater trochanter.   Will benefit from skilled PT  at this time until transition to HHPT or orthopedic consult.   OBJECTIVE IMPAIRMENTS: Abnormal gait, decreased activity tolerance, difficulty walking, decreased ROM, decreased strength, impaired perceived functional ability, and pain.   ACTIVITY LIMITATIONS: carrying, lifting, squatting, stairs, bed mobility, and locomotion level  PARTICIPATION LIMITATIONS: driving, community activity, and yard work  PERSONAL FACTORS: Age, Time since onset of injury/illness/exacerbation, and 3+ comorbidities: bursitis, anxiety, chronic lymphocytic leukemia, COPD, depression, and vitamin B12 deficiency are also affecting patient's functional outcome.   REHAB POTENTIAL: Good  CLINICAL DECISION MAKING: Evolving/moderate complexity  EVALUATION COMPLEXITY: Moderate   GOALS: Goals reviewed with patient? Yes  SHORT TERM GOALS: Target date: 06/17/2024      Patient will be independent in home exercise program to improve strength/mobility for better functional independence with ADLs. Baseline: No HEP currently  Goal status: INITIAL   LONG TERM GOALS: Target date: 07/15/2024    Patient will improve lower extremity functional scale by 25 points or greater in order to indicate statistically significant improvement in lower extremity function. Baseline: 0/80 Goal status: INITIAL  2.  Pt will improve L hip flexion and ABD strength to 4+/5 or greater to demonstrate improved L hip muscle function without pain.   Baseline: see eval chart  Goal status:  INITIAL  3.  Pt will improve 5XSTS test by 5 sec or more to indicate improved LE strength and power  Baseline: 27.6sec 10/7 Goal status: INITIAL   PLAN:  PT FREQUENCY: 1-2x/week  PT DURATION: 6 weeks  PLANNED INTERVENTIONS: 97750- Physical Performance Testing, 97110-Therapeutic exercises, 97530- Therapeutic activity, 97112- Neuromuscular re-education, 97535- Self Care, [id number]- Manual therapy, [phone]- Gait training, Patient/Family education, Balance  training, Joint mobilization, Cryotherapy, and Moist heat  PLAN FOR NEXT SESSION:   Continue  heat and nustep for pain relief and LE strength, long arc distraction L hip, gluteal and core strength as indicate;   Follow up on HHPT referral from oncology.  If no improvement within next 2-3 weeks visits seek orthopaedic referral for further workup; already instructed pt she should seek orthopaedic referral at eval.    Massie FORBES Dollar, PT 07/02/2024, 8:18 AM

## 2024-07-05 ENCOUNTER — Ambulatory Visit: Admitting: Physical Therapy

## 2024-07-05 ENCOUNTER — Encounter: Payer: Self-pay | Admitting: Oncology

## 2024-07-05 ENCOUNTER — Encounter: Payer: Self-pay | Admitting: Physical Therapy

## 2024-07-05 DIAGNOSIS — M6281 Muscle weakness (generalized): Secondary | ICD-10-CM

## 2024-07-05 DIAGNOSIS — R269 Unspecified abnormalities of gait and mobility: Secondary | ICD-10-CM | POA: Diagnosis not present

## 2024-07-05 DIAGNOSIS — R2681 Unsteadiness on feet: Secondary | ICD-10-CM

## 2024-07-05 DIAGNOSIS — R2689 Other abnormalities of gait and mobility: Secondary | ICD-10-CM

## 2024-07-05 DIAGNOSIS — R262 Difficulty in walking, not elsewhere classified: Secondary | ICD-10-CM

## 2024-07-05 NOTE — Therapy (Signed)
 OUTPATIENT PHYSICAL THERAPY LOWER EXTREMITY TREATMENT   Patient Name: Monique [name] MRN: [id number] DOB:1970/11/26, 53 y.o., female Today's Date: 07/05/2024  END OF SESSION:  PT End of Session - 07/05/24 0845     Visit Number 7    Number of Visits 16    Date for Recertification  07/29/24    Progress Note Due on Visit 10    PT Start Time 0845    PT Stop Time 0925    PT Time Calculation (min) 40 min    Activity Tolerance Patient limited by pain    Behavior During Therapy [facility] for tasks assessed/performed            Past Medical History:  Diagnosis Date   Anxiety    Bursitis    leg pain   Carpal tunnel syndrome    Cervical dysplasia    hx LEEP over 18 years ago.    Chronic lymphocytic leukemia (HCC) 2018   Dr Jacobo.  (in lymph nodes)   COPD (chronic obstructive pulmonary disease) (HCC)    COVID-19 2021   Depression    Eating disorder    GERD (gastroesophageal reflux disease)    History of self-harm    Insomnia    Obsession    Tobacco abuse    Vitamin B12 deficiency (non anemic)    Past Surgical History:  Procedure Laterality Date   BREAST BIOPSY Right 01/05/2018   US  guided biopsy of 2 areas and 1 lymph node, MIXED INFLAMMATION AND GIANT CELL REACTION   CERVICAL BIOPSY  W/ LOOP ELECTRODE EXCISION     COLONOSCOPY WITH PROPOFOL  N/A 04/21/2019   Procedure: COLONOSCOPY WITH PROPOFOL ;  Surgeon: Janalyn Keene NOVAK, MD;  Location: [facility] ENDOSCOPY;  Service: Gastroenterology;  Laterality: N/A;   IR BONE MARROW BIOPSY & ASPIRATION  04/26/2024   OTHER SURGICAL HISTORY     scar tissue removed from vocal cords   TUBAL LIGATION     VAGINAL HYSTERECTOMY N/A 01/22/2021   Procedure: HYSTERECTOMY VAGINAL; BILATERAL SALPINGECTOMY;  Surgeon: Connell Davies, MD;  Location: [facility] ORS;  Service: Gynecology;  Laterality: N/A;   vocal cord surgery  2005   Patient Active Problem List   Diagnosis Date Noted   Stage 3a chronic kidney disease (HCC) 10/02/2023   Senile purpura  10/02/2023   Major depression in remission 10/02/2023   Pancytopenia (HCC) 10/02/2023   Post-menopause on HRT (hormone replacement therapy) 11/08/2022   At risk for sleep apnea 09/19/2021   Lumbar foraminal stenosis (L4-5) (Bilateral) (R>L) 06/05/2021   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 06/05/2021   Lumbosacral radiculopathy/radiculitis at L5 (Bilateral) 06/05/2021   PAD (peripheral artery disease) 02/14/2021   CIN II (cervical intraepithelial neoplasia II) 02/01/2021   S/P vaginal hysterectomy 01/22/2021   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/09/2020   Bilateral leg pain 11/03/2019   Iron  deficiency anemia 10/15/2019   Bilateral arm pain 08/05/2019   Bilateral hand numbness 08/05/2019   Weakness of both hands 08/05/2019   Chronic musculoskeletal pain 07/19/2019   Chronic neuropathic pain 07/19/2019   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   Special screening for malignant neoplasms, colon    Polyp of sigmoid colon    Hemorrhoids    Trigeminal neuralgia 10/05/2018   Lumbar radiculitis (Right) 09/24/2018   Hypocalcemia 06/03/2018   Vitamin D  insufficiency 06/03/2018   Abnormal MRI, lumbar spine (06/02/2020) 06/03/2018   Chronic hip pain (Right) 06/03/2018   Spondylosis without myelopathy or radiculopathy, lumbar region 06/03/2018   DDD (degenerative disc disease),  lumbar 06/02/2018   Lumbar facet hypertrophy 06/02/2018   Lumbar facet arthropathy 06/02/2018   Lumbar facet syndrome (Bilateral) (R>L) 06/02/2018   Marijuana use 05/19/2018   Chronic lower extremity pain (1ry area of Pain) (Bilateral) (R>L) 05/13/2018   Chronic low back pain (2ry area of Pain) (Bilateral) (R>L) w/ sciatica (Bilateral) 05/13/2018   Chronic pain syndrome 05/13/2018   Disorder of skeletal system 05/13/2018   Pharmacologic therapy 05/13/2018   Problems influencing health status 05/13/2018   Mastalgia 05/05/2018   Breast mass, right 01/07/2018   Face pain 12/11/2017    Tingling 12/11/2017   Thoracic aortic atherosclerosis 08/20/2017   Emphysema of lung (HCC) 08/20/2017   Adductor tendinitis 04/23/2017   Trochanteric bursitis of right hip 04/23/2017   GERD without esophagitis 12/11/2016   CLL (chronic lymphocytic leukemia) (HCC) 11/24/2016   Stress incontinence 09/04/2016   B12 deficiency 07/10/2015   Insomnia 07/10/2015   Anorexia nervosa, restricting type (HCC) 07/10/2015   Anxiety, generalized 07/10/2015   H/O suicide attempt 07/10/2015   Lymphocytosis 07/10/2015   Dysplasia of cervix, low grade (CIN 1) 07/10/2015   Obsessive-compulsive disorder 07/10/2015   Tobacco use 07/10/2015   History of cervical dysplasia 07/18/2014    PCP: Glenard Mire, MD   REFERRING PROVIDER: Sowles, Krichna, MD   REFERRING DIAG: M79.18 (ICD-10-CM) - Pain of thigh muscle   THERAPY DIAG:  Abnormality of gait and mobility  Unsteadiness on feet  Difficulty in walking, not elsewhere classified  Muscle weakness (generalized)  Other abnormalities of gait and mobility  Rationale for Evaluation and Treatment: Rehabilitation  ONSET DATE: 05/03/24  SUBJECTIVE:   SUBJECTIVE STATEMENT:    Today: pt states that hip is feeling a little better each day. States that hip was a little sore following last PT treatment, but is improving overall. Still waiting for [facility] agency to reach out to schedule Evaluation. Is hopeful that HHPT will start soon.    Pt reports that she will be starting new trial medication for CLL in December.  Once treatment starts it will be a daily regiment in [place].    From eval: Pt has significant discomfort in her left ant thigh in the inguinal crease. Pain radiates down the leg from there. Pt also has neuropathy on the R thigh. Pain on the left began about 1 month ago. She went to PCP, got some numbing cream. Pt is still considering options for seeing MD but is awaiting for PT recommendations. Pt does not recall when the pain began.  She does know that rolling in bed causes a lot of pain.   PERTINENT HISTORY: Past medical history of bursitis, anxiety, chronic lymphocytic leukemia, COPD, depression, and vitamin B12 deficiency. PAIN:  Are you having pain? Yes: NPRS scale: 10/10 walking, no pain sitting, 10/10 rolling over in bed  Pain location: left ant hip along inguinal crease  Pain description: sharp, radiating  Aggravating factors: walking, rolling over in bed  Relieving factors: sitting, rest   PRECAUTIONS: Fall  RED FLAGS: Known lymphocytic leukemia    WEIGHT BEARING RESTRICTIONS: No  FALLS:  Has patient fallen in last 6 months? No  LIVING ENVIRONMENT: Lives with: lives alone Lives in: House/apartment Stairs: Yes: External: 3 steps; bilateral but cannot reach both Has following equipment at home: Single point cane  OCCUPATION: n/a  PLOF: Independent  PATIENT GOALS: improve pain, mobility, and function   NEXT MD VISIT: Not scheduled for this problem yet.   OBJECTIVE:  Note: Objective measures were completed at Evaluation unless otherwise  noted.  DIAGNOSTIC FINDINGS: no images yet   PATIENT SURVEYS:   LEFS  Extreme difficulty/unable (0), Quite a bit of difficulty (1), Moderate difficulty (2), Little difficulty (3), No difficulty (4) Survey date:    Any of your usual work, housework or school activities 0  2. Usual hobbies, recreational or sporting activities 0  3. Getting into/out of the bath 0  4. Walking between rooms 0  5. Putting on socks/shoes 0  6. Squatting  0  7. Lifting an object, like a bag of groceries from the floor 0  8. Performing light activities around your home 0  9. Performing heavy activities around your home 0  10. Getting into/out of a car 0  11. Walking 2 blocks 0  12. Walking 1 mile 0  13. Going up/down 10 stairs (1 flight) 0  14. Standing for 1 hour 0  15.  sitting for 1 hour 0  16. Running on even ground 0  17. Running on uneven ground 0  18. Making sharp  turns while running fast 0  19. Hopping  0  20. Rolling over in bed 0  Score total:  0     COGNITION: Overall cognitive status: Within functional limits for tasks assessed     SENSATION: Not tested  POSTURE: rounded shoulders, forward head, and flexed trunk   PALPATION: Tender to palpation along inguinal crease, quad and hip flexor musculature   LOWER EXTREMITY ROM:  Pain with FABER  LOWER EXTREMITY MMT:  MMT Right eval Left eval  Hip flexion 4+ 3+  Hip extension    Hip abduction 4+ 3+  Hip adduction 4+ 3+  Hip internal rotation    Hip external rotation    Knee flexion 4+ 4  Knee extension 4+ 4  Ankle dorsiflexion 4+ 4  Ankle plantarflexion    Ankle inversion    Ankle eversion     (Blank rows = not tested)  LOWER EXTREMITY SPECIAL TESTS:  Hip special tests: Belvie (FABER) test: positive , Thomas test: Not tested, Hip scouring test: positive , and Piriformis test: positive     GAIT: Distance walked: 5 ft with SPC- flexed trunk, flexed knees, decreased step leng B  Assistive device utilized: Single point cane Level of assistance: Modified independence                                                                                                                                 TREATMENT DATE: 07/05/24    TE- To improve strength, endurance, mobility, and function of specific targeted muscle groups or improve joint range of motion or improve muscle flexibility  Nustep AAROM rolling hill program level 1-5 x 8 min with cues to remain in pain free Range on the R hip. No increased pain reported by pt.   Stand pivot tranfers to and from nustep, chair and mat table with supervision assist and  no UE    TA:  Sit<>stand 3 x 5:  Modified lunge over SPC to shift weight into front LE x 8 bil; 1 UE support on bed.   Reciprocal foot tap on 4inch step with BUE support on bed Reports pulling sensation in the L anterior thigh with stance for stepping tasks on this day.  Short therapeutic rest break for thigh pain upon completion. Pt also self selected to walk from chair to EOB x 3 steps each direction; antalgia still noted.   TE:  SAQ 2.5# AW x 12 bil  Bridge x 8, noted to have improved ROM wth decreased pain on this day. Quad set x 12 bil  Heel slide x 12 bil   Hot pack applied on supine.   Isometric hip adduction x 15 bil  SAQ without resistance with Heel slide x 15  Partial bridge with adduction x 8 bil  Hip abduction x 20 with RTB LTR x 15 bil in pain free ROM.    Sit<>supine without assist on this day  Gait with SPC 2x 20ft on this day. Mild antalgia but improved posture and step length noted. Reports only pulling sensation in the hip with extension.   Manual:  IASTM with TP release to TFL. IT and quadriceps as well as HS and adductors. x 10 min  Gentle longitudinal distraction 2 x 45 sec.    Reports reduced tightness in BLE following manual theray    PATIENT EDUCATION: Education details: . HHPT vs OPPT given new start of chemotherapy treatment. Need for possible orthopedic referral if hip pain does not improve.  Person educated: Patient Education method: Explanation Education comprehension: verbalized understanding   HOME EXERCISE PROGRAM: Access Code: [email or web address] URL: [email or web address] Date: 06/07/2024 Prepared by: Lonni Gainer  Exercises - Supine Transversus Abdominis Bracing - Hands on Stomach  - 1 x daily - 7 x weekly - 2 sets - 10 reps - 3 sec hold - Supine Gluteal Sets  - 1 x daily - 7 x weekly - 2 sets - 10 reps - 3 sec hold   ASSESSMENT:  CLINICAL IMPRESSION:   Patient is a 52 year old female presented physical therapy with significant pain in her left inguinal crease rating it into her iliopsoas and rectus femoris muscular region. Pt was recently seen in hospital for plural effusion d/t CLL. Has now re-started CA treatment. With new CA treatment regiment starting soon, pt would benefit from  transition to HHPT for management of hip pain and activity tolerance. Pt will request HHPT referral from oncology, and d/c when HHPT course begins.   Pt tolerated increased functional movement training on this day mild tightness in L thigh, as well as continued TE for improved hip and BLE strengthening. Reports mild decrease in LLE tightness upon completion of treatment on this day.   Will benefit from skilled PT at this time until transition to HHPT or orthopedic consult.   OBJECTIVE IMPAIRMENTS: Abnormal gait, decreased activity tolerance, difficulty walking, decreased ROM, decreased strength, impaired perceived functional ability, and pain.   ACTIVITY LIMITATIONS: carrying, lifting, squatting, stairs, bed mobility, and locomotion level  PARTICIPATION LIMITATIONS: driving, community activity, and yard work  PERSONAL FACTORS: Age, Time since onset of injury/illness/exacerbation, and 3+ comorbidities: bursitis, anxiety, chronic lymphocytic leukemia, COPD, depression, and vitamin B12 deficiency are also affecting patient's functional outcome.   REHAB POTENTIAL: Good  CLINICAL DECISION MAKING: Evolving/moderate complexity  EVALUATION COMPLEXITY: Moderate   GOALS: Goals reviewed with patient? Yes  SHORT TERM GOALS: Target date: 06/17/2024      Patient will  be independent in home exercise program to improve strength/mobility for better functional independence with ADLs. Baseline: No HEP currently  Goal status: INITIAL   LONG TERM GOALS: Target date: 07/15/2024    Patient will improve lower extremity functional scale by 25 points or greater in order to indicate statistically significant improvement in lower extremity function. Baseline: 0/80 Goal status: INITIAL  2.  Pt will improve L hip flexion and ABD strength to 4+/5 or greater to demonstrate improved L hip muscle function without pain.   Baseline: see eval chart  Goal status: INITIAL  3.  Pt will improve 5XSTS test by 5  sec or more to indicate improved LE strength and power  Baseline: 27.6sec 10/7 Goal status: INITIAL   PLAN:  PT FREQUENCY: 1-2x/week  PT DURATION: 6 weeks  PLANNED INTERVENTIONS: 97750- Physical Performance Testing, 97110-Therapeutic exercises, 97530- Therapeutic activity, 97112- Neuromuscular re-education, 97535- Self Care, [id number]- Manual therapy, [phone]- Gait training, Patient/Family education, Balance training, Joint mobilization, Cryotherapy, and Moist heat  PLAN FOR NEXT SESSION:   Continue  heat and nustep for pain relief and LE strength, long arc distraction L hip, gluteal and core strength as indicated; functional movement training.  Follow up on HHPT referral from oncology.  If no improvement within next 2-3 weeks visits seek orthopaedic referral for further workup; already instructed pt she should seek orthopaedic referral at eval.    Massie FORBES Dollar, PT 07/05/2024, 8:46 AM

## 2024-07-07 ENCOUNTER — Encounter: Admitting: Physical Therapy

## 2024-07-09 ENCOUNTER — Ambulatory Visit: Admitting: Physical Therapy

## 2024-07-09 DIAGNOSIS — Z008 Encounter for other general examination: Secondary | ICD-10-CM | POA: Diagnosis not present

## 2024-07-09 DIAGNOSIS — Z515 Encounter for palliative care: Secondary | ICD-10-CM | POA: Diagnosis not present

## 2024-07-12 ENCOUNTER — Telehealth: Payer: Self-pay

## 2024-07-12 ENCOUNTER — Ambulatory Visit: Payer: Self-pay | Admitting: *Deleted

## 2024-07-12 ENCOUNTER — Ambulatory Visit: Admitting: Physical Therapy

## 2024-07-12 NOTE — Telephone Encounter (Signed)
 [name] wants to make PCP aware that pt pain has not gotten better and unable to put much weight on her L leg. Also pt had a CT done with [facility] and wants to make it aware to be looked at prior pt following up with [name] Spray, FNP.

## 2024-07-12 NOTE — Telephone Encounter (Signed)
 Copied from CRM [id number]. Topic: Clinical - Medical Advice >> Jul 12, 2024 11:07 AM Nathanel BROCKS wrote: Reason for CRM: [name] Saba, [phone] from [facility] wants to talk to you about pt. She is having issues with her groin, left side, left lower leg. Please call her she has several questions and suggestions.

## 2024-07-12 NOTE — Telephone Encounter (Signed)
 No available appt with PCP. Scheduled appt with other provider 07/14/24. Patient requesting x ray      FYI Only or Action Required?: FYI only for provider.  Patient was last seen in primary care on 05/19/2024 by Monique Mire, MD.  Called Nurse Triage reporting Hip Pain.  Symptoms began several weeks ago.  Interventions attempted: Rest, hydration, or home remedies physical therapy not effective  Symptoms are: gradually worsening.  Triage Disposition: See PCP When Office is Open (Within 3 Days)  Patient/caregiver understands and will follow disposition?: Yes      Copied from CRM [id number]. Topic: Clinical - Red Word Triage >> Jul 12, 2024 10:56 AM Lonell PEDLAR wrote: Red Word that prompted transfer to Nurse Triage: possible fractured rib. Severe pain in hip. Requesting xray Reason for Disposition  [1] MODERATE pain (e.g., interferes with normal activities, limping) AND [2] present > 3 days  Answer Assessment - Initial Assessment Questions Appt scheduled 07/14/24. Worsening chronic left hip pain and rib pain. Urinary frequency but denies incontinence. Requesting xray. Reports physical therapy not effective. Recommended if sx worsen go to ED.        1. LOCATION and RADIATION: Where is the pain located? Does the pain spread (shoot) anywhere else?     Left hip , to groin and knee  and left rib pain 2. QUALITY: What does the pain feel like?  (e.g., sharp, dull, aching, burning)     Irritating  3. SEVERITY: How bad is the pain? What does it keep you from doing?   (Scale 1-10; or mild, moderate, severe)     Severe when to knee in the evenings and moderate through out the day  4. ONSET: When did the pain start? Does it come and go, or is it there all the time?     On going  5. WORK OR EXERCISE: Has there been any recent work or exercise that involved this part of the body?      No but does do physical therapy  6. CAUSE: What do you think is causing the hip pain?       Not sure  7. AGGRAVATING FACTORS: What makes the hip pain worse? (e.g., walking, climbing stairs, running)     Walking  8. OTHER SYMPTOMS: Do you have any other symptoms? (e.g., back pain, pain shooting down leg,  fever, rash)     Left hip pain shooting to groin area and knee at times. Comes and goes. Left rib pain at times. Reports physical therapy not effective. Denies chest pain no difficulty breathing no fever reported.  Protocols used: Hip Pain-A-AH

## 2024-07-13 ENCOUNTER — Encounter: Admitting: Physical Therapy

## 2024-07-14 ENCOUNTER — Ambulatory Visit (INDEPENDENT_AMBULATORY_CARE_PROVIDER_SITE_OTHER): Admitting: Family Medicine

## 2024-07-14 ENCOUNTER — Encounter: Payer: Self-pay | Admitting: Family Medicine

## 2024-07-14 ENCOUNTER — Ambulatory Visit: Admitting: Physical Therapy

## 2024-07-14 ENCOUNTER — Ambulatory Visit: Admitting: Nurse Practitioner

## 2024-07-14 VITALS — BP 110/68 | HR 110 | Resp 16 | Ht 67.0 in | Wt 171.0 lb

## 2024-07-14 DIAGNOSIS — R350 Frequency of micturition: Secondary | ICD-10-CM | POA: Diagnosis not present

## 2024-07-14 DIAGNOSIS — C9110 Chronic lymphocytic leukemia of B-cell type not having achieved remission: Secondary | ICD-10-CM | POA: Diagnosis not present

## 2024-07-14 DIAGNOSIS — J910 Malignant pleural effusion: Secondary | ICD-10-CM | POA: Insufficient documentation

## 2024-07-14 DIAGNOSIS — D849 Immunodeficiency, unspecified: Secondary | ICD-10-CM | POA: Insufficient documentation

## 2024-07-14 LAB — POCT URINALYSIS DIPSTICK
Appearance: NORMAL
Bilirubin, UA: NEGATIVE
Blood, UA: NEGATIVE
Glucose, UA: NEGATIVE
Ketones, UA: NEGATIVE
Leukocytes, UA: NEGATIVE
Nitrite, UA: NEGATIVE
Protein, UA: NEGATIVE
Spec Grav, UA: 1.02 (ref 1.010–1.025)
Urobilinogen, UA: 0.2 U/dL
pH, UA: 6 (ref 5.0–8.0)

## 2024-07-14 NOTE — Progress Notes (Signed)
 Name: Monique [name]   MRN: [id number]    DOB: 07/25/1971   Date:07/14/2024       Progress Note  Subjective  Chief Complaint  Chief Complaint  Patient presents with   Hip Pain    Left, worsening. X3 months   Urinary Frequency    X1 month   Chest Pain    Left side ribs, x3 weeks   Discussed the use of AI scribe software for clinical note transcription with the patient, who gave verbal consent to proceed.  History of Present Illness Monique [name] is a 53 year old female with chronic lymphocytic leukemia who presents with leg pain and pleural effusion.  She has a history of chronic lymphocytic leukemia diagnosed in February 2018, with recurrent pleural effusions. Her most recent hospitalization was in September 2025 for malignant pleural effusion, during which she required oxygen support due to increased heart rate and shortness of breath. She was admitted for a week and received supportive treatment, including blood transfusions. She denies current use of oxygen since her last hospital discharge.  She is experiencing significant leg pain, described as discomfort from the groin to the knee, persisting for months. The pain is severe, reaching a 10 out of 10 when walking, and she uses a cane for mobility. She has been taking pregabalin , tizanidine , and nortriptyline  for her leg pain and restless leg syndrome, but reports no relief. The pain causes her to walk sideways, as if one leg is shorter than the other.  She has been involved in multiple treatment regimens for her leukemia, including chemotherapy in 2019 and various other treatments, some of which have failed over time. She is currently participating in a clinical trial involving cell therapy at [facility]. She recent started a new regiment and denies side effects. Due for follow up with oncologist in 2 days  She experiences chest pain on the left side, initially thought to be due to a broken rib, but no fractures were found on imaging  done this month. She has a history of breaking ribs on the right side. She reports that a clinician examined her groin and did not find any lymph node swelling.    Patient Active Problem List   Diagnosis Date Noted   Stage 3a chronic kidney disease (HCC) 10/02/2023   Senile purpura 10/02/2023   Major depression in remission 10/02/2023   Pancytopenia (HCC) 10/02/2023   Post-menopause on HRT (hormone replacement therapy) 11/08/2022   At risk for sleep apnea 09/19/2021   Lumbar foraminal stenosis (L4-5) (Bilateral) (R>L) 06/05/2021   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 06/05/2021   Lumbosacral radiculopathy/radiculitis at L5 (Bilateral) 06/05/2021   PAD (peripheral artery disease) 02/14/2021   CIN II (cervical intraepithelial neoplasia II) 02/01/2021   S/P vaginal hysterectomy 01/22/2021   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/09/2020   Bilateral leg pain 11/03/2019   Iron  deficiency anemia 10/15/2019   Bilateral arm pain 08/05/2019   Bilateral hand numbness 08/05/2019   Weakness of both hands 08/05/2019   Chronic musculoskeletal pain 07/19/2019   Chronic neuropathic pain 07/19/2019   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   Special screening for malignant neoplasms, colon    Polyp of sigmoid colon    Hemorrhoids    Trigeminal neuralgia 10/05/2018   Lumbar radiculitis (Right) 09/24/2018   Hypocalcemia 06/03/2018   Vitamin D  insufficiency 06/03/2018   Abnormal MRI, lumbar spine (06/02/2020) 06/03/2018   Chronic hip pain (Right) 06/03/2018   Spondylosis without myelopathy or radiculopathy, lumbar region  06/03/2018   DDD (degenerative disc disease), lumbar 06/02/2018   Lumbar facet hypertrophy 06/02/2018   Lumbar facet arthropathy 06/02/2018   Lumbar facet syndrome (Bilateral) (R>L) 06/02/2018   Marijuana use 05/19/2018   Chronic lower extremity pain (1ry area of Pain) (Bilateral) (R>L) 05/13/2018   Chronic low back pain (2ry area of Pain) (Bilateral)  (R>L) w/ sciatica (Bilateral) 05/13/2018   Chronic pain syndrome 05/13/2018   Disorder of skeletal system 05/13/2018   Pharmacologic therapy 05/13/2018   Problems influencing health status 05/13/2018   Mastalgia 05/05/2018   Breast mass, right 01/07/2018   Face pain 12/11/2017   Tingling 12/11/2017   Thoracic aortic atherosclerosis 08/20/2017   Emphysema of lung (HCC) 08/20/2017   Adductor tendinitis 04/23/2017   Trochanteric bursitis of right hip 04/23/2017   GERD without esophagitis 12/11/2016   CLL (chronic lymphocytic leukemia) (HCC) 11/24/2016   Stress incontinence 09/04/2016   B12 deficiency 07/10/2015   Insomnia 07/10/2015   Anorexia nervosa, restricting type (HCC) 07/10/2015   Anxiety, generalized 07/10/2015   H/O suicide attempt 07/10/2015   Lymphocytosis 07/10/2015   Dysplasia of cervix, low grade (CIN 1) 07/10/2015   Obsessive-compulsive disorder 07/10/2015   Tobacco use 07/10/2015   History of cervical dysplasia 07/18/2014    Past Surgical History:  Procedure Laterality Date   BREAST BIOPSY Right 01/05/2018   US  guided biopsy of 2 areas and 1 lymph node, MIXED INFLAMMATION AND GIANT CELL REACTION   CERVICAL BIOPSY  W/ LOOP ELECTRODE EXCISION     COLONOSCOPY WITH PROPOFOL  N/A 04/21/2019   Procedure: COLONOSCOPY WITH PROPOFOL ;  Surgeon: Janalyn Keene NOVAK, MD;  Location: [facility];  Service: Gastroenterology;  Laterality: N/A;   IR BONE MARROW BIOPSY & ASPIRATION  04/26/2024   OTHER SURGICAL HISTORY     scar tissue removed from vocal cords   TUBAL LIGATION     VAGINAL HYSTERECTOMY N/A 01/22/2021   Procedure: HYSTERECTOMY VAGINAL; BILATERAL SALPINGECTOMY;  Surgeon: Connell Davies, MD;  Location: [facility] ORS;  Service: Gynecology;  Laterality: N/A;   vocal cord surgery  2005    Family History  Problem Relation Age of Onset   Depression Mother    Cancer Mother        thyroid   Alcohol abuse Father    COPD Father    Alcohol abuse Brother    Depression Brother     Bipolar disorder Brother    Suicidality Brother    ADD / ADHD Son    Breast cancer Neg Hx     Social History   Tobacco Use   Smoking status: Every Day    Current packs/day: 1.50    Types: Cigarettes   Smokeless tobacco: Never  Substance Use Topics   Alcohol use: Not Currently    Alcohol/week: 0.0 standard drinks of alcohol    Comment: rarely     Current Outpatient Medications:    albuterol  (VENTOLIN  HFA) 108 (90 Base) MCG/ACT inhaler, Inhale 2 puffs into the lungs every 6 (six) hours as needed for wheezing or shortness of breath., Disp: 8 g, Rfl: 0   alendronate  (FOSAMAX ) 70 MG tablet, Take 1 tablet (70 mg total) by mouth every 7 (seven) days. Take with a full glass of water on an empty stomach., Disp: 12 tablet, Rfl: 3   allopurinol  (ZYLOPRIM ) 300 MG tablet, Take 300 mg by mouth daily., Disp: , Rfl:    ARIPiprazole  (ABILIFY ) 5 MG tablet, Take 1 tablet (5 mg total) by mouth daily., Disp: 90 tablet, Rfl: 0   ASPIRIN   81 PO, Take 81 mg by mouth daily., Disp: , Rfl:    atorvastatin  (LIPITOR) 10 MG tablet, Take 1 tablet (10 mg total) by mouth daily., Disp: 100 tablet, Rfl: 3   benzonatate  (TESSALON ) 100 MG capsule, Take 1-2 capsules (100-200 mg total) by mouth 3 (three) times daily as needed for cough., Disp: 40 capsule, Rfl: 0   Ca Phosphate-Cholecalciferol (CALTRATE GUMMY BITES) 250-10 MG-MCG CHEW, Chew 1 gummy by mouth 2 (two) times daily., Disp: 180 tablet, Rfl: 3   estradiol  (ESTRACE ) 0.5 MG tablet, Take 1 tablet (0.5 mg total) by mouth daily., Disp: 90 tablet, Rfl: 3   famotidine  (PEPCID ) 10 MG tablet, Take by mouth., Disp: , Rfl:    levocetirizine (XYZAL ) 5 MG tablet, Take 1 tablet (5 mg total) by mouth every evening., Disp: 90 tablet, Rfl: 1   MAGNESIUM  PO, Take by mouth., Disp: , Rfl:    nortriptyline  (PAMELOR ) 10 MG capsule, Take 2 capsules (20 mg total) by mouth daily., Disp: 180 capsule, Rfl: 1   omeprazole  (PRILOSEC) 20 MG capsule, Take 1 capsule (20 mg total) by mouth in  the morning., Disp: 30 capsule, Rfl: 1   pirtobrutinib (JAYPIRCA) 100 MG tablet, Take 200 mg by mouth daily., Disp: , Rfl:    pregabalin  (LYRICA ) 150 MG capsule, Take 1 capsule (150 mg total) by mouth 3 (three) times daily., Disp: 90 capsule, Rfl: 1   tiZANidine  (ZANAFLEX ) 2 MG tablet, Take 1 tablet (2 mg total) by mouth at bedtime., Disp: 90 tablet, Rfl: 0   umeclidinium-vilanterol (ANORO ELLIPTA ) 62.5-25 MCG/ACT AEPB, Inhale 1 puff into the lungs daily., Disp: 180 each, Rfl: 1   vitamin B-12 (CYANOCOBALAMIN ) 500 MCG tablet, Take 250 mcg by mouth every morning., Disp: , Rfl:    VITAMIN D  PO, Take by mouth., Disp: , Rfl:    lenalidomide  (REVLIMID ) 10 MG capsule, TAKE 1 CAPSULE BY MOUTH EVERY DAY (Patient not taking: Reported on 06/25/2024), Disp: 28 capsule, Rfl: 0   lidocaine  (LIDODERM ) 5 %, Place 1 patch onto the skin daily. Remove & Discard patch within 12 hours or as directed by MD (Patient not taking: Reported on 06/25/2024), Disp: 30 patch, Rfl: 0   lidocaine -prilocaine  (EMLA ) cream, Apply 1 Application topically as needed. (Patient not taking: Reported on 06/25/2024), Disp: 60 g, Rfl: 0  Allergies  Allergen Reactions   Doxycycline  Rash    Rash on cheeks and sides of nose    Duloxetine  Rash    I personally reviewed active problem list, medication list, allergies with the patient/caregiver today.   ROS  Ten systems reviewed and is negative except as mentioned in HPI    Objective Physical Exam CONSTITUTIONAL: Patient appears well-developed and well-nourished. No distress. HEENT: Head atraumatic, normocephalic, neck supple. CARDIOVASCULAR: Normal rate, regular rhythm and normal heart sounds. No murmur heard. No BLE edema. PULMONARY: Effort normal. Decreased breath sounds in right lower lung field. Lungs clear to auscultation on the left. No respiratory distress. ABDOMINAL: There is no tenderness or distention. MUSCULOSKELETAL:antalgic gait, using a cane, pain on right thigh when  walking PSYCHIATRIC: Patient has a normal mood and affect. Behavior is normal. Judgment and thought content normal.  Vitals:   07/14/24 1130  BP: 110/68  Pulse: (!) 110  Resp: 16  Weight: 171 lb (77.6 kg)  Height: 5' 7 (1.702 m)    Body mass index is 26.78 kg/m.  Recent Results (from the past 2160 hours)  Cytology - PAP     Status: Abnormal   Collection Time:  04/22/24 10:11 AM  Result Value Ref Range   High risk HPV Positive (A)    Adequacy Satisfactory for evaluation.    Diagnosis (A)     - Atypical squamous cells of undetermined significance (ASC-US )   Comment Normal Reference Range HPV - Negative   CMP (Cancer Center only)     Status: Abnormal   Collection Time: 04/22/24  2:09 PM  Result Value Ref Range   Sodium 135 135 - 145 mmol/L   Potassium 4.0 3.5 - 5.1 mmol/L   Chloride 104 98 - 111 mmol/L   CO2 23 22 - 32 mmol/L   Glucose, Bld 97 70 - 99 mg/dL    Comment: Glucose reference range applies only to samples taken after fasting for at least 8 hours.   BUN 19 6 - 20 mg/dL   Creatinine 8.62 (H) 9.55 - 1.00 mg/dL   Calcium  8.6 (L) 8.9 - 10.3 mg/dL   Total Protein 5.6 (L) 6.5 - 8.1 g/dL   Albumin 3.3 (L) 3.5 - 5.0 g/dL   AST 20 15 - 41 U/L   ALT 12 0 - 44 U/L   Alkaline Phosphatase 115 38 - 126 U/L   Total Bilirubin 0.6 0.0 - 1.2 mg/dL   GFR, Estimated 46 (L) >60 mL/min    Comment: (NOTE) Calculated using the CKD-EPI Creatinine Equation (2021)    Anion gap 8 5 - 15    Comment: Performed at [facility], [address] [place], [place] [address]  CBC with Differential (Cancer Center Only)     Status: Abnormal   Collection Time: 04/22/24  2:09 PM  Result Value Ref Range   WBC Count 3.2 (L) 4.0 - 10.5 K/uL   RBC 2.17 (L) 3.87 - 5.11 MIL/uL   Hemoglobin 7.9 (L) 12.0 - 15.0 g/dL   HCT 76.4 (L) 63.9 - 53.9 %   MCV 108.3 (H) 80.0 - 100.0 fL   MCH 36.4 (H) 26.0 - 34.0 pg   MCHC 33.6 30.0 - 36.0 g/dL   RDW 81.7 (H) 88.4 - 84.4 %   Platelet Count 44 (L) 150 -  400 K/uL    Comment: SPECIMEN CHECKED FOR CLOTS Immature Platelet Fraction may be clinically indicated, consider ordering this additional test [id number]    nRBC 0.0 0.0 - 0.2 %   Neutrophils Relative % 21 %   Neutro Abs 0.7 (L) 1.7 - 7.7 K/uL   Lymphocytes Relative 59 %   Lymphs Abs 1.9 0.7 - 4.0 K/uL   Monocytes Relative 14 %   Monocytes Absolute 0.5 0.1 - 1.0 K/uL   Eosinophils Relative 1 %   Eosinophils Absolute 0.0 0.0 - 0.5 K/uL   Basophils Relative 0 %   Basophils Absolute 0.0 0.0 - 0.1 K/uL   WBC Morphology See Note     Comment: Mild Left Shift. 1 to 5% metas, occ myelos. DIFF. CONFIRMED BY SMEAR   RBC Morphology MORPHOLOGY UNREMARKABLE    Smear Review See Note     Comment: PLATELETS APPEAR DECREASED   Immature Granulocytes 5 %   Abs Immature Granulocytes 0.17 (H) 0.00 - 0.07 K/uL    Comment: Performed at [facility], [address] [place], [place] [address]  IntelliGEN Myeloid     Status: None   Collection Time: 04/22/24  3:22 PM  Result Value Ref Range   Specimen Type See Scanned report in [place] Link    Clinical Indication See Scanned report in [place] Link    RESULT  SUMMARY See Scanned report in [place] Link    INTERPRETATION See Scanned report in [place] Link    METHODOLOGY See Scanned report in [facility] Link    REFERENCES See Scanned report in [place] Link    DIRECTOR REVIEW See Scanned report in [place] Link     Comment: Performed at [facility], [address] [place], [place] [address]  Folic Acid      Status: None   Collection Time: 04/22/24  3:22 PM  Result Value Ref Range   Folate 8.5 >5.9 ng/mL    Comment: Performed at [facility], [address] [place], [place] [address]  Haptoglobin     Status: Abnormal   Collection Time: 04/22/24  3:22 PM  Result Value Ref Range   Haptoglobin <10 (L) 33 - 346 mg/dL    Comment: (NOTE) Performed At: [facility] [address]  [place], [place] [id number] Jennette Shorter MD Ey:[id number]   Vitamin B12     Status: Abnormal   Collection Time: 04/22/24  3:22 PM  Result Value Ref Range   Vitamin B-12 989 (H) 180 - 914 pg/mL    Comment: (NOTE) This assay is not validated for testing neonatal or myeloproliferative syndrome specimens for Vitamin B12 levels. Performed at [facility], [address] [place], [place] [address]   Lactate dehydrogenase     Status: Abnormal   Collection Time: 04/22/24  3:22 PM  Result Value Ref Range   LDH 276 (H) 98 - 192 U/L    Comment: Performed at [facility], [address] [place], [place] [address]  Iron  and TIBC     Status: None   Collection Time: 04/22/24  3:22 PM  Result Value Ref Range   Iron  58 28 - 170 ug/dL   TIBC 676 749 - 549 ug/dL   Saturation Ratios 18 10.4 - 31.8 %   UIBC 265 ug/dL    Comment: Performed at [facility], [address] [place], [place] [address]  Ferritin     Status: None   Collection Time: 04/22/24  3:22 PM  Result Value Ref Range   Ferritin 89 11 - 307 ng/mL    Comment: Performed at [facility], [address] [place], [place] [address]  Reticulocytes     Status: Abnormal   Collection Time: 04/22/24  3:23 PM  Result Value Ref Range   Retic Ct Pct 2.1 0.4 - 3.1 %   RBC. 2.19 (L) 3.87 - 5.11 MIL/uL   Retic Count, Absolute 45.8 19.0 - 186.0 K/uL   Immature Retic Fract 6.4 2.3 - 15.9 %    Comment: Performed at [facility], [address] [place], [place] [address]  Type and screen     Status: None   Collection Time: 04/22/24  3:23 PM  Result Value Ref Range   ABO/RH(D) A POS    Antibody Screen NEG    Sample Expiration 04/25/2024,2359    Unit Number [id number]    Blood Component Type RCLI PHER 1    Unit division 00    Status of Unit ISSUED,FINAL    Transfusion Status OK TO TRANSFUSE    Crossmatch Result      Compatible Performed at [facility], [address] [place],  [place] [address]   BPAM RBC     Status: None   Collection Time: 04/22/24  3:23 PM  Result Value Ref Range   ISSUE DATE /  TIME [id number]    Blood Product Unit Number [id number]    PRODUCT CODE [id number]    Unit Type and Rh 6200    Blood Product Expiration Date 797491717640   Prepare RBC (crossmatch)     Status: None   Collection Time: 04/22/24  4:30 PM  Result Value Ref Range   Order Confirmation      ORDER PROCESSED BY BLOOD BANK Performed at [facility], [address], [place], [place] [address]   Surgical pathology     Status: None   Collection Time: 04/26/24 12:00 AM  Result Value Ref Range   SURGICAL PATHOLOGY      Surgical Pathology CASE: [id number] PATIENT: Monique [name] Bone Marrow Report     Clinical History: CLL, thrombocytopenia - restaging     DIAGNOSIS:  BONE MARROW, ASPIRATE, CLOT, CORE: - Hypercellular bone marrow (90%) involved by the patient's known chronic lymphocytic leukemia/small lymphocytic lymphoma.  See comment.  PERIPHERAL BLOOD: - Thrombocytopenia, absolute lymphocytosis  COMMENT: The bone marrow shows a diffuse lymphocytic infiltrate comprising 95% of the bone marrow cellularity.  Findings are consistent with involvement by the patient's known chronic lymphocytic leukemia.  Flow cytometric analysis also identified a clonal B-cell population constituting 96% of the lymphocytes.  The cells are positive for CD5, CD19, CD200 and express lambda light chains.  Correlation with pending cytogenetics is recommended for further assessment.  MICROSCOPIC DESCRIPTION:  PERIPHERAL BLOOD SMEAR: Platelets: Decreased, no platelet clumps identified Erythroi d: Macrocytosis, anisopoikilocytosis Leukocytes: Absolute lymphocytosis, neutropenia  BONE MARROW ASPIRATE: Cellular The bone marrow aspirate shows a predominant population of lymphocytes with minimal background trilineage hematopoiesis  TOUCH PREPARATIONS: Cellular, confirmatory of  aspirate findings  CLOT AND BIOPSY: The bone marrow clot and biopsy show a diffuse lymphocytic infiltrate comprising 90% of the cellularity with minimal background trilineage hematopoiesis.  The findings are confirmatory of the touch prep and aspirate impression.  SPECIAL STAINS: CD3: CD3 highlights T lymphocytes CD5: CD5 highlights T lymphocytes and neoplastic B lymphocytes CD79a: CD79a highlights neoplastic B lymphocytes CD23: CD23 highlights neoplastic B lymphocytes IRON  STAIN: Iron  stains are performed on a bone marrow aspirate or touch imprint smear and section of clot. The controls stained appropriately.       Storage Iron : Adequate      Ring Sideroblasts: Not identified   AD DITIONAL DATA/TESTING: Cytogenetics   CELL COUNT DATA:  Bone Marrow count performed on 500 cells shows: Blasts:   0%   Myeloid:  3% Promyelocytes: 0%   Erythroid:     7% Myelocytes:    2%   Lymphocytes:   89% Metamyelocytes:     0%   Plasma cells:  0% Bands:    0% Neutrophils:   1%   M:E ratio:     0.42 Eosinophils:   0% Basophils:     0% Monocytes:     1%  Lab Data: CBC performed on 04/26/2024 shows: WBC: 6.0 k/uL  Neutrophils:   38% Hgb: 3.11 g/dL Lymphocytes:   43% HCT: 31.8 %    Monocytes:     5% MCV: 102.3 fL  Eosinophils:   1% RDW: 19.6 %    Basophils:     0% PLT: 41 k/uL    GROSS DESCRIPTION:  A: Aspirate smear  B: The specimen is received in B plus fixative, and consists of a 25.0 x 5.0 x 2.0 mm aggregate of red-brown clotted blood.  The specimen is entirely submitted in 1 cassette.  C: The specimen is received in B  plus fixative, and consists of 3 core and core fragments of red-brown bone, ranging from 0.2 cm to 2.2 cm in length by 0.2 cm in  diameter.  The specimen is entirely submitted in 1 cassette following decalcification with Immunocal.  SHIRLEEN 04/26/2024)   Final Diagnosis performed by Ilsa Pottier, MD.   Electronically signed 04/28/2024 Technical and / or  Professional components performed at [facility], [address], [place], [place] [address].  Immunohistochemistry Technical component (if applicable) was performed at [facility]. [address], [place], [place] [address].   IMMUNOHISTOCHEMISTRY DISCLAIMER (if applicable): Some of these immunohistochemical stains may have been developed and the performance characteristics determine by [facility]. Some may not have been cleared or approved by the U.S. Food and Drug Administration. The FDA has determined that such clearance or approval is not necessary. This test is used for clinical purposes. It should not be regarded as investigational or for research. This laboratory is certified under t he Clinical Laboratory Improvement Amendments of 1988 (CLIA-88) as qualified to perform high complexity clinical laboratory testing.  The controls stained appropriately.   IHC stains are performed on formalin fixed, paraffin embedded tissue using a 3,3diaminobenzidine (DAB) chromogen and Leica Bond Autostainer System. The staining intensity of the nucleus is score manually and is reported as the percentage of tumor cell nuclei demonstrating specific nuclear staining. The specimens are fixed in 10% Neutral Formalin for at least 6 hours and up to 72hrs. These tests are validated on decalcified tissue. Results should be interpreted with caution given the possibility of false negative results on decalcified specimens. Antibody Clones are as follows ER-clone 72F, PR-clone 16, Ki67- clone MM1. Some of these immunohistochemical stains may have been developed and the performance characteristics determined by [facility].   Surgical pathology     Status: None   Collection Time: 04/26/24 12:00 AM  Result Value Ref Range   SURGICAL PATHOLOGY      Surgical Pathology CASE: [id number] PATIENT: Monique [name] Flow Pathology  Report     Clinical history: CLL, thrombocytopenia-restaging     DIAGNOSIS:  - Abnormal clonal B-cell population identified.  See comment.  COMMENT:  Flow cytometric analysis identified a clonal B-cell population constituting 96% of lymphocytes. The B cells are positive for CD5, CD19, CD200 and express lambda light chains.  CD20 is negative, likely due to treatment.  The findings are consistent with involvement by the patient's known chronic lymphocytic leukemia/small lymphocytic lymphoma.   GATING AND PHENOTYPIC ANALYSIS:  Gated population: Flow cytometric immunophenotyping is performed using antibodies to the antigens listed in the table below. Electronic gates are placed around a cell cluster displaying light scatter properties corresponding to: lymphocytes  Abnormal Cells in gated population: 96%  Phenotype of Abnormal Cells: CD5, CD19, CD200, Lambda                        Lymphoid Antigens       Myeloid Antigens Miscellaneous CD2  NEG  CD10 NEG  CD11b     ND   CD45 POS CD3  NEG  CD19 POS  CD11c     ND   HLA-Dr    ND CD4  NEG  CD20 POS  CD13 ND   CD34 NEG CD5  POS  CD22 ND   CD14 ND   CD38 NEG CD7  NEG  CD79b     ND   CD15 ND   CD138  ND CD8  NEG  CD103     ND   CD16 ND   TdT  ND CD25 ND   CD200     POS  CD33 ND   CD123     ND TCRab     ND   sKappa    NEG  CD64 ND   CD41 ND TCRgd     NEG  sLambda   POS  CD117     ND   CD61 ND CD56 NEG  cKappa    ND   MPO  ND   CD71 ND CD57 ND   cLambda   ND        [id number]      GROSS DESCRIPTION:  Reference bone marrow case [id number].    Final Diagnosis performed by Ilsa Pottier, MD.   Electronically signed 04/27/2024 Technical and / or Professional components performed at [facility], [address] [place], [place] [address].  The above tests were developed and their performance characteristics determined by the [facility] system for the physical and immunopheno typic characterization of  cell populations. They have not been cleared by the U.S. Food and Drug administration. The  FDA has determined that such clearance or approval is not necessary. This test is used for clinical purposes. It should not be  regarded as investigational or for research   CBC with Differential/Platelet     Status: Abnormal   Collection Time: 04/26/24  7:55 AM  Result Value Ref Range   WBC 6.0 4.0 - 10.5 K/uL   RBC 3.11 (L) 3.87 - 5.11 MIL/uL   Hemoglobin 10.7 (L) 12.0 - 15.0 g/dL   HCT 68.1 (L) 63.9 - 53.9 %   MCV 102.3 (H) 80.0 - 100.0 fL   MCH 34.4 (H) 26.0 - 34.0 pg   MCHC 33.6 30.0 - 36.0 g/dL   RDW 80.3 (H) 88.4 - 84.4 %   Platelets 41 (L) 150 - 400 K/uL    Comment: PLATELET COUNT CONFIRMED BY SMEAR Immature Platelet Fraction may be clinically indicated, consider ordering this additional test [id number]    nRBC 0.0 0.0 - 0.2 %   Neutrophils Relative % 18 %   Neutro Abs 1.1 (L) 1.7 - 7.7 K/uL   Lymphocytes Relative 67 %   Lymphs Abs 4.2 (H) 0.7 - 4.0 K/uL   Monocytes Relative 8 %   Monocytes Absolute 0.5 0.1 - 1.0 K/uL   Eosinophils Relative 1 %   Eosinophils Absolute 0.0 0.0 - 0.5 K/uL   Basophils Relative 1 %   Basophils Absolute 0.0 0.0 - 0.1 K/uL   WBC Morphology See Note     Comment: 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   RBC Morphology MORPHOLOGY UNREMARKABLE    Smear Review See Note     Comment: Large Platelets Present   Immature Granulocytes 5 %   Abs Immature Granulocytes 0.27 (H) 0.00 - 0.07 K/uL    Comment: Performed at [facility], [address] [place], [place] [address]  Haptoglobin     Status: Abnormal   Collection Time: 04/29/24 11:26 AM  Result Value Ref Range   Haptoglobin <10 (L) 33 - 346 mg/dL    Comment: (NOTE) Performed At: [facility] [address] [place], [place] [id number] Jennette Shorter MD Ey:[phone]   Lactate dehydrogenase     Status: Abnormal   Collection Time: 04/29/24 11:26 AM   Result Value Ref Range   LDH 309 (H) 98 - 192 U/L  Comment: Performed at [facility], [address] [place], [place] [address]  CBC with Differential/Platelet     Status: Abnormal   Collection Time: 04/29/24 11:26 AM  Result Value Ref Range   WBC 4.4 4.0 - 10.5 K/uL   RBC 2.84 (L) 3.87 - 5.11 MIL/uL   Hemoglobin 9.8 (L) 12.0 - 15.0 g/dL   HCT 70.8 (L) 63.9 - 53.9 %   MCV 102.5 (H) 80.0 - 100.0 fL   MCH 34.5 (H) 26.0 - 34.0 pg   MCHC 33.7 30.0 - 36.0 g/dL   RDW 80.6 (H) 88.4 - 84.4 %   Platelets 41 (L) 150 - 400 K/uL    Comment: Immature Platelet Fraction may be clinically indicated, consider ordering this additional test [id number]    nRBC 0.0 0.0 - 0.2 %   Neutrophils Relative % 26 %   Neutro Abs 1.1 (L) 1.7 - 7.7 K/uL   Lymphocytes Relative 62 %   Lymphs Abs 2.8 0.7 - 4.0 K/uL   Monocytes Relative 5 %   Monocytes Absolute 0.2 0.1 - 1.0 K/uL   Eosinophils Relative 1 %   Eosinophils Absolute 0.0 0.0 - 0.5 K/uL   Basophils Relative 1 %   Basophils Absolute 0.0 0.0 - 0.1 K/uL   WBC Morphology See Note     Comment: Mild Left Shift (1-5% metas, occ myelo)   RBC Morphology MORPHOLOGY UNREMARKABLE    Smear Review Normal platelet morphology    Immature Granulocytes 5 %   Abs Immature Granulocytes 0.21 (H) 0.00 - 0.07 K/uL    Comment: Performed at [facility], [address] [place], [place] [address]  Glucose, capillary     Status: None   Collection Time: 05/04/24  1:13 PM  Result Value Ref Range   Glucose-Capillary 97 70 - 99 mg/dL    Comment: Glucose reference range applies only to samples taken after fasting for at least 8 hours.  CMP (Cancer Center only)     Status: Abnormal   Collection Time: 05/10/24  9:27 AM  Result Value Ref Range   Sodium 135 135 - 145 mmol/L   Potassium 3.8 3.5 - 5.1 mmol/L   Chloride 101 98 - 111 mmol/L   CO2 24 22 - 32 mmol/L   Glucose, Bld 107 (H) 70 - 99 mg/dL    Comment: Glucose reference range applies only to samples  taken after fasting for at least 8 hours.   BUN 22 (H) 6 - 20 mg/dL   Creatinine 8.82 (H) 9.55 - 1.00 mg/dL   Calcium  9.4 8.9 - 10.3 mg/dL   Total Protein 6.0 (L) 6.5 - 8.1 g/dL   Albumin 3.6 3.5 - 5.0 g/dL   AST 27 15 - 41 U/L   ALT 15 0 - 44 U/L   Alkaline Phosphatase 117 38 - 126 U/L   Total Bilirubin 1.2 0.0 - 1.2 mg/dL   GFR, Estimated 55 (L) >60 mL/min    Comment: (NOTE) Calculated using the CKD-EPI Creatinine Equation (2021)    Anion gap 10 5 - 15    Comment: Performed at [facility], [address] [place], [place] [address]  Hold Tube- Blood Bank     Status: None   Collection Time: 05/10/24  9:29 AM  Result Value Ref Range   Blood Bank Specimen SAMPLE AVAILABLE FOR TESTING    Sample Expiration      05/13/2024,2359 Performed at [facility], [address] [place], [place] [address]   Lactate  dehydrogenase     Status: Abnormal   Collection Time: 05/10/24  9:29 AM  Result Value Ref Range   LDH 343 (H) 98 - 192 U/L    Comment: Performed at [facility], [address] [place], [place] [address]  CBC with Differential/Platelet     Status: Abnormal   Collection Time: 05/10/24  9:29 AM  Result Value Ref Range   WBC 5.2 4.0 - 10.5 K/uL   RBC 2.59 (L) 3.87 - 5.11 MIL/uL   Hemoglobin 9.2 (L) 12.0 - 15.0 g/dL   HCT 73.2 (L) 63.9 - 53.9 %   MCV 103.1 (H) 80.0 - 100.0 fL   MCH 35.5 (H) 26.0 - 34.0 pg   MCHC 34.5 30.0 - 36.0 g/dL   RDW 79.8 (H) 88.4 - 84.4 %   Platelets 46 (L) 150 - 400 K/uL    Comment: Immature Platelet Fraction may be clinically indicated, consider ordering this additional test [id number]    nRBC 0.0 0.0 - 0.2 %   Neutrophils Relative % 24 %   Neutro Abs 1.2 (L) 1.7 - 7.7 K/uL   Lymphocytes Relative 66 %   Lymphs Abs 3.5 0.7 - 4.0 K/uL   Monocytes Relative 5 %   Monocytes Absolute 0.2 0.1 - 1.0 K/uL   Eosinophils Relative 1 %   Eosinophils Absolute 0.1 0.0 - 0.5 K/uL   Basophils Relative 1 %   Basophils Absolute 0.0 0.0  - 0.1 K/uL   WBC Morphology See Note     Comment: diff. confirmed by smear   RBC Morphology MORPHOLOGY UNREMARKABLE    Smear Review See Note     Comment: PLATELETS APPEAR DECREASED   Immature Granulocytes 3 %   Abs Immature Granulocytes 0.14 (H) 0.00 - 0.07 K/uL    Comment: Performed at [facility], [address] [place], [place] [address]  Cytology - Non PAP;     Status: None   Collection Time: 05/11/24 12:00 AM  Result Value Ref Range   CYTOLOGY - NON GYN      CYTOLOGY - NON PAP [facility] [address], [address] [place], [place] [address] Telephone [phone] or [phone] Fax [phone]  CYTOPATHOLOGY REPORT   Accession #: [id number] Patient Name: Monique [name], Monique [name] Visit # : [id number]  MRN: [id number] Physician: Jacobo Lye DOB/Age Jan 25, 1971 (Age: 37) Gender: F Collected Date: 05/11/2024 Received Date: 05/11/2024  FINAL DIAGNOSIS STATEMENT Of SPECIMEN ADEQUACY:  INTERPRETATION(S):      - LYMPHOCYTE PREDOMINANT EFFUSION CONSISTENT WITH INVOLVEMENT BY PATIENT'S KNOWN      CLL/SLL BASED ON FLOW CYTOMETRIC ANALYSIS.   Diagnosis Note : Per [facility] the patient has CLL/SLL. 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 ThinPrep): 1      # Cell Block: 1      # Diff-Quick Stain:  0  Report signed out from the following location(s) [place] [address] [place], [place] [address] CLIA #: [id number]  [facility] 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    ANNOTATION COMMENT IMP Comment     Comment: (NOTE) Findings represent involvement by CLL/SLL. Cytomorphologic correlation is required for definitive diagnosis. Correlation with the fluid cell count and cytologic findings is required. Prior flow cytometry results have been reviewed and showed similar findings.    CLINICAL INFO Comment     Comment: Chronic Lymphocytic Leukemia   Specimen Type Comment     Comment: (NOTE) Pleural fluid/chest cavity fluid *includes thoracentesis fluid and pleural effusion    ASSESSMENT OF LEUKOCYTES Comment     Comment: (NOTE) A CD5+, CD23+ and CD43+ monoclonal B-cell population (94% of total viable lymphoid cells, 100% of B cells) is detected with dim lambda light chain restriction in the background of many polytypic B cells, negative for FMC7 and CD38. There is no loss of, or aberrant expression of, the pan T cell antigens to suggest a neoplastic T cell process. CD4:CD8 ratio 2.4 Analysis of the viable lymphoid cells shows: B cells 94%, T cells 6%    % Viable Cells Comment     Comment: 97%   Immunophenotypic Profile 94% of total cells (Phenotype below)     Comment: Comment Abnormal cell population: present    ANALYSIS AND GATING STRATEGY Comment     Comment: (NOTE) 8 color analysis with 7-AAD,  CD45/SSC gating  Technical-Analysis performed at [facility], [employer] of [address], [address] [place] [address], Director: Aniceto Christen, MDPhD, [phone]    IMMUNOPHENOTYPING STUDY Comment     Comment: (NOTE) CD2       (-)            CD3       (-) CD4       (-)            CD5       (+) CD7       (-)            CD8       (-) CD10      (-)            CD11b     (-) CD19      (+)            CD20      (+) Dim CD23      (+)            CD30      (-) CD38      (-)            CD43      (+) CD45      (+)            CD56      (-) CD57      (-)            FMC-7     (-) HLA-DR    (+)  KAPPA     (-) LAMBDA    (+) Dim    PATHOLOGIST NAME Comment     Comment: Rumalda Nearing, M.D.   COMMENT: Comment     Comment: (NOTE) Each antibody in this assay was utilized to assess for potential abnormalities of studied cell populations or to characterize identified abnormalities. This test was developed and its performance characteristics determined by [facility].  It has not been cleared or approved by the U.S. Food and Drug Administration. The FDA has determined that such clearance or approval is not necessary. This test is used for clinical purposes.  It should not be regarded as investigational or for research. Performed At: -[facility] [address] [place], [place] [id number] Loran Gales MDPhD Ey:[phone] Performed At: [facility] [address] [place], [place] [id number] Loran Gales MDPhD Ey:[phone]   CK (Creatine Kinase)     Status: Abnormal   Collection Time: 05/19/24  3:12 PM  Result Value Ref Range   Total CK 18 (L) 21 - 240 U/L  Cytology - Non PAP;     Status: None   Collection Time: 06/25/24 12:00 AM  Result Value Ref Range   CYTOLOGY - NON GYN      CYTOLOGY - NON PAP [facility] [address], [address] [place], [place] [address] Telephone [phone] or [phone] Fax [phone]  CYTOPATHOLOGY  REPORT   Accession #: [id number] Patient Name: Monique [name], Monique [name] Visit # : [id number]  MRN: [id number] Physician: Tamea Saunas DOB/Age 22-Nov-1970 (Age: 85) Gender: F Collected Date: 06/25/2024 Received Date: 06/25/2024  FINAL DIAGNOSIS STATEMENT Of SPECIMEN ADEQUACY:  INTERPRETATION(S):      LYMPHOCYTIC INFILTRATE PRESENT, CONSISTENT WITH PATIENT'S KNOWN CHRONIC      LYMPHOCYTIC LEUKEMIA.      DATE SIGNED OUT: 07/01/2024 ELECTRONIC SIGNATURE BETHA Pottier M.D., Nupur, Pathologist, Electronic Signature   CASE COMMENTS   CLINICAL HISTORY  SOURCE OF SPECIMEN(S) Pleural Fluid, Right  SPECIMEN COMMENTS: SPECIMEN CLINICAL INFORMATION:    Gross Description Specimen: Received is/are 1000cc of dark yellow fluid in an evacuated container and one slide      Prepared:      # Smea rs: 1      # Concentration Technique Slides (i.e. ThinPrep): 1      # Cell Block: 1      # Diff-Quick Stain: 0        Report signed out from the following location(s) [place] [address] [place], [place] [address] CLIA #: [id number]  [facility] [address] [place], [place] [address] CLIA #: [id number]   Lactate dehydrogenase (pleural or peritoneal fluid)     Status: Abnormal   Collection Time: 06/25/24 11:52 AM  Result Value Ref Range   LD, Fluid 392 (H) 3 - 23 U/L    Comment: (NOTE) Results should be evaluated in conjunction with serum values    Fluid Type-FLDH CYTO PLEU     Comment: Performed at [facility], [address] [place], [place] [address]  Body fluid cell count with differential     Status: Abnormal   Collection Time: 06/25/24 11:52 AM  Result Value Ref Range   Fluid Type-FCT CYTO PLEU    Color, Fluid AMBER (A) YELLOW   Appearance, Fluid CLOUDY (A) CLEAR   Total Nucleated Cell Count, Fluid 9,292 cu mm   Neutrophil Count, Fluid 1 %   Lymphs, Fluid 97 %   Monocyte-Macrophage-Serous Fluid 2 %  Eos, Fluid 0 %     Comment: Performed at [facility], [address] [place], [place] [address]  Protein, pleural or peritoneal fluid     Status: None   Collection Time: 06/25/24 11:52 AM  Result Value Ref Range   Total protein, fluid <3.0 g/dL    Comment: (NOTE) No normal range established for this test Results should be evaluated in conjunction with serum values    Fluid Type-FTP CYTO PLEU     Comment: Performed at [facility], [address] [place], [place] [address]  Glucose, pleural or peritoneal fluid     Status: None   Collection Time: 06/25/24 11:52 AM  Result Value Ref Range   Glucose, Fluid 94 mg/dL    Comment: (NOTE) No normal range established for this test Results should be evaluated in conjunction with serum values    Fluid Type-FGLU CYTO PLEU     Comment: Performed at [facility], [address] [place], [place] [address]  Flow cytometry panel-leukemia/lymphoma work-up     Status: None   Collection Time: 06/25/24 11:52 AM  Result Value Ref Range   PATH INTERP XXX-IMP Comment     Comment: (NOTE) CD5+, CD23+, FMC7-, CD43+, CD19+, and dim CD20+ clonal B cell population detected, phenotype consistent with chronic lymphocytic leukemia (CLL)/small lymphocytic lymphoma (SLL). (See comment.)    ANNOTATION COMMENT IMP Comment     Comment: (NOTE) The phenotype is typical of chronic lymphocytic leukemia (CLL) /small lymphocytic lymphoma (SLL). Morphologic correlation is required for diagnosis. Please refer to the cytology / histology case, if available, for the final diagnosis. Previous similar phenotyping reports from 10/29/16 and 05/11/24 were reviewed.    Specimen Type Comment     Comment: (NOTE) Pleural fluid/chest cavity fluid *includes thoracentesis fluid and pleural effusion    ASSESSMENT OF LEUKOCYTES Comment     Comment: (NOTE) A CD5+, CD23+, FMC7-, CD43+ clonal B cell population is detected with dim lambda light chain restriction,  representing >99% of the B cells and 71% of the analyzed cells. By light scatter, the clonal B cells appear approximately the same size as the background T cells. There is no loss of, or aberrant expression of, the pan T cell antigens to suggest a neoplastic T cell process. CD4:CD8 ratio 2.0 Analysis of the viable lymphoid cell shows:  B cells 71%, T cells 29%    % Viable Cells Comment     Comment: 96%   Immunophenotypic Profile 71% of total cells (Phenotype below)     Comment: Comment Abnormal cell population: present    ANALYSIS AND GATING STRATEGY Comment     Comment: (NOTE) 8 color analysis with 7-AAD, CD45/SSC gating  Technical-Analysis performed at [facility], [employer] [facility] [employer], [address] [place] [address], Director: Aniceto Christen, MDPhD, [phone]    IMMUNOPHENOTYPING STUDY Comment     Comment: (NOTE) CD2       (-)            CD3       (-) CD4       (-)            CD5       (+) CD7       (-)            CD8       (-) CD10      (-)            CD11b     (-)  CD19      (+)            CD20      (+) Dim CD23      (+)            CD30      (-) CD38      (-)            CD43      (+) CD45      (+)            CD56      (-) CD57      (-)            FMC-7     (-) HLA-DR    (+)            KAPPA     (-) LAMBDA    (+) Dim    PATHOLOGIST NAME Comment     Comment: Clotilda Sake, M.D.   COMMENT: Comment     Comment: (NOTE) Each antibody in this assay was utilized to assess for potential abnormalities of studied cell populations or to characterize identified abnormalities. This test was developed and its performance characteristics determined by [facility].  It has not been cleared or approved by the U.S. Food and Drug Administration. The FDA has determined that such clearance or approval is not necessary. This test is used for clinical purposes.  It should not be regarded as investigational or for research. Performed At: -[facility] [address] [place], [place] [id number] Loran Gales MDPhD Ey:[phone] Performed At: [facility] [address] [place], [place] [id number] Loran Gales MDPhD Ey:[phone]   POCT Urinalysis Dipstick     Status: None   Collection Time: 07/14/24 11:44 AM  Result Value Ref Range   Color, UA Yellow    Clarity, UA Clear    Glucose, UA Negative Negative   Bilirubin, UA Negative    Ketones, UA Negative    Spec Grav, UA 1.020 1.010 - 1.025   Blood, UA Negative    pH, UA 6.0 5.0 - 8.0   Protein, UA Negative Negative   Urobilinogen, UA 0.2 0.2 or 1.0 E.U./dL   Nitrite, UA Negative    Leukocytes, UA Negative Negative   Appearance Normal    Odor None       PHQ2/9:    05/19/2024    2:33 PM 05/03/2024   10:38 AM 04/22/2024    2:20 PM 03/11/2024   11:02 AM 03/03/2024   12:59 PM  Depression screen PHQ 2/9  Decreased Interest 0 0 0 0 0  Down, Depressed, Hopeless 0 0 0 0 0  PHQ - 2 Score 0 0 0 0 0  Altered sleeping 0 0 0 0 0  Tired, decreased energy 0 0 0 0 0  Change in appetite 0 0 0 0 0  Feeling bad or failure about yourself  0 0 0 0 0  Trouble concentrating 0 0 0 0 0  Moving slowly or fidgety/restless 0 0 0 0 0  Suicidal thoughts 0 0 0 0 0  PHQ-9 Score 0 0 0 0 0  Difficult doing work/chores Not difficult at all Not difficult at all   Not difficult at all    phq 9 is negative  Fall Risk:    05/19/2024    2:33 PM 05/03/2024   10:38 AM 03/03/2024   12:59 PM 02/06/2024   11:29 AM 01/29/2024    9:49  AM  Fall Risk   Falls in the past year? 0 0 0 0 0  Number falls in past yr: 0 0 0 0 0  Injury with Fall? 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 Imaging shows increased pleural thickening, lymphadenopathy, and pericardial effusion. Clinical trial treatment ongoing with 20% remission chance. Symptoms include fatigue, dyspnea, chest pain, and leg pain due to possible nerve compression. - Continue clinical trial treatment at [facility]. - Monitor for infection signs due to immunosuppression. - Advise immediate medical attention for fever or feeling unwell. - Discuss pain management with oncologist next visit. - Consider thoracentesis if pleural effusion worsens.  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 Persistent fatigue. - Monitor hemoglobin levels, transfuse if below 8 g/dL.  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 Previous treatments ineffective, mobility affected. - Discuss pain management with oncologist next visit. - Consider referral to pain management clinic if needed. - Continue physical therapy.  Restless legs syndrome Persistent jerking movements, nortriptyline  only slightly effective  Tobacco use Continues smoking despite CLL diagnosis and hospitalization. - Advise smoking cessation to improve health and treatment outcomes.  Frequency of micturition Increased frequency, no UTI, normal kidney function. - Monitor symptoms and kidney function. - sending urine for culture

## 2024-07-15 LAB — URINE CULTURE
MICRO NUMBER:: 17164724
SPECIMEN QUALITY:: ADEQUATE

## 2024-07-16 ENCOUNTER — Other Ambulatory Visit: Payer: Self-pay | Admitting: Family Medicine

## 2024-07-16 ENCOUNTER — Encounter: Payer: Self-pay | Admitting: Oncology

## 2024-07-16 ENCOUNTER — Ambulatory Visit: Payer: Self-pay | Admitting: Family Medicine

## 2024-07-16 ENCOUNTER — Encounter: Admitting: Physical Therapy

## 2024-07-16 ENCOUNTER — Other Ambulatory Visit: Payer: Self-pay

## 2024-07-16 MED ORDER — OMEPRAZOLE 20 MG PO CPDR
20.0000 mg | DELAYED_RELEASE_CAPSULE | Freq: Every morning | ORAL | 1 refills | Status: DC
  Filled 2024-07-27: qty 30, 30d supply, fill #0
  Filled 2024-08-13 – 2024-08-19 (×3): qty 30, 30d supply, fill #1

## 2024-07-16 NOTE — Telephone Encounter (Signed)
 Copied from CRM [id number]. Topic: Clinical - Medication Refill >> Jul 16, 2024  9:46 AM Nathanel BROCKS wrote: Medication: atorvastatin  (LIPITOR) 10 MG tablet  Has the patient contacted their pharmacy? Yes  This is the patient's preferred pharmacy:   [facility] - [facility] [address] [place] [address] Phone: [phone] Fax: [phone]  Is this the correct pharmacy for this prescription? Yes If no, delete pharmacy and type the correct one.   Has the prescription been filled recently? Yes  Is the patient out of the medication? Yes  Has the patient been seen for an appointment in the last year OR does the patient have an upcoming appointment? Yes  Can we respond through MyChart? No  Agent: Please be advised that Rx refills may take up to 3 business days. We ask that you follow-up with your pharmacy.

## 2024-07-17 NOTE — Telephone Encounter (Signed)
 Too soon for refill.  Requested Prescriptions  Pending Prescriptions Disp Refills   atorvastatin  (LIPITOR) 10 MG tablet 100 tablet 3    Sig: Take 1 tablet (10 mg total) by mouth daily.     Cardiovascular:  Antilipid - Statins Failed - 07/17/2024  9:56 AM      Failed - Lipid Panel in normal range within the last 12 months    Cholesterol  Date Value Ref Range Status  10/02/2023 115 <200 mg/dL Final   LDL Cholesterol (Calc)  Date Value Ref Range Status  10/02/2023 54 mg/dL (calc) Final    Comment:    Reference range: <100 . Desirable range <100 mg/dL for primary prevention;   <70 mg/dL for patients with CHD or diabetic patients  with > or = 2 CHD risk factors. SABRA LDL-C is now calculated using the Martin-Hopkins  calculation, which is a validated novel method providing  better accuracy than the Friedewald equation in the  estimation of LDL-C.  Gladis APPLETHWAITE et al. SANDREA. 7986;689(80): 2061-2068  (http://education.[facility].com/faq/FAQ164)    HDL  Date Value Ref Range Status  10/02/2023 45 (L) > OR = 50 mg/dL Final   Triglycerides  Date Value Ref Range Status  10/02/2023 77 <150 mg/dL Final         Passed - Patient is not pregnant      Passed - Valid encounter within last 12 months    Recent Outpatient Visits           3 days ago CLL (chronic lymphocytic leukemia) [facility])   [facility] Sowles, Krichna, MD   1 month ago Pain of thigh muscle   [facility] Glenard Mire, MD   2 months ago Osteopenia determined by x-ray   [facility] Glenard Mire, MD   4 months ago Rib pain   [facility] [place], Mire, MD   5 months ago Right-sided chest wall pain   [facility] Glenard Mire, MD       Future Appointments             In 2 weeks Sowles, Krichna, MD [facility] [place]

## 2024-07-19 ENCOUNTER — Other Ambulatory Visit (HOSPITAL_COMMUNITY): Payer: Self-pay

## 2024-07-19 ENCOUNTER — Encounter: Admitting: Physical Therapy

## 2024-07-21 ENCOUNTER — Encounter: Admitting: Physical Therapy

## 2024-07-26 ENCOUNTER — Encounter: Admitting: Physical Therapy

## 2024-07-27 ENCOUNTER — Other Ambulatory Visit: Payer: Self-pay

## 2024-07-27 ENCOUNTER — Ambulatory Visit: Admitting: Pulmonary Disease

## 2024-07-27 ENCOUNTER — Encounter: Payer: Self-pay | Admitting: Pulmonary Disease

## 2024-07-27 VITALS — BP 90/64 | HR 91 | Temp 97.6°F | Ht 67.0 in | Wt 157.8 lb

## 2024-07-27 DIAGNOSIS — R058 Other specified cough: Secondary | ICD-10-CM

## 2024-07-27 DIAGNOSIS — C9110 Chronic lymphocytic leukemia of B-cell type not having achieved remission: Secondary | ICD-10-CM | POA: Diagnosis not present

## 2024-07-27 DIAGNOSIS — F17210 Nicotine dependence, cigarettes, uncomplicated: Secondary | ICD-10-CM | POA: Diagnosis not present

## 2024-07-27 DIAGNOSIS — J90 Pleural effusion, not elsewhere classified: Secondary | ICD-10-CM

## 2024-07-27 LAB — NITRIC OXIDE: Nitric Oxide: 6

## 2024-07-27 NOTE — Patient Instructions (Addendum)
 VISIT SUMMARY:  Today, we discussed your ongoing treatment for chronic lymphocytic leukemia and the associated right pleural effusion. Your shortness of breath has resolved, but you still have a dry cough. Your last pleural tap was successful, and the fluid in your lung has reduced. We also reviewed your upcoming bone marrow procedure as part of your CAR T-cell therapy.  YOUR PLAN:  -RIGHT PLEURAL EFFUSION SECONDARY TO CHRONIC LYMPHOCYTIC LEUKEMIA: A pleural effusion is a buildup of fluid between the tissues that line the lungs and the chest. Your pleural effusion is improving with your current treatment for chronic lymphocytic leukemia, and no further fluid removal is needed at this time. Continue your current therapy and watch for signs of infection or increased fluid, such as fever, shortness of breath, or discolored sputum.  -DRY COUGH ASSOCIATED WITH LUNG RE-EXPANSION: Your dry cough is likely due to your lung re-expanding as the fluid decreases. This should improve as your lung fully re-expands. Keep an eye on your cough and let us  know if you notice any changes, such as discolored sputum, fever, or increased shortness of breath, as these may require antibiotic treatment and/or repeated thoracentesis.  INSTRUCTIONS:  Please continue your current therapy for chronic lymphocytic leukemia and monitor for any signs of infection or increased fluid. Report any changes in your cough, such as discolored sputum, fever, or increased shortness of breath. Your bone marrow procedure is scheduled for the 25th as part of your CAR T-cell therapy.

## 2024-07-27 NOTE — Progress Notes (Unsigned)
 Subjective:    Patient ID: Monique [name], female    DOB: Sep 22, 1970, 53 y.o.   MRN: [id number]  Patient Care Team: Sowles, Krichna, MD as PCP - General (Family Medicine) Jacobo Evalene PARAS, MD as Consulting Physician (Oncology) [name], [facility] (Inactive) as Pharmacist (Pharmacist) [name], RN as Registered Nurse (Oncology) Borders, Fonda SAUNDERS, NP as Nurse Practitioner (Hospice and Palliative Medicine) Lenn Aran, MD as Consulting Physician (Radiation Oncology) Bridgman, [facility] Lever, Sonny SAUNDERS, MD as Referring Physician (Oncology)  Chief Complaint  Patient presents with   Pleural Effusion    BACKGROUND/INTERVAL: Patient is a 53 year old current smoker who presents for follow-up of a right sided pleural effusion in the setting of chronic lymphocytic leukemia.  She was admitted to [facility] in late September and noted to have a right sided recurrent pleural effusion.  She had a chest tube placed for drainage of the effusion on 14 June 2024.  She had the chest tube removed on 17 June 2024.  Recommendation at that time by the Interventional Pulmonary team at [facility] was to follow-up in 2 weeks to determine whether repeat thoracentesis versus PleurX catheter should be considered.  Fluid is positive for CLL.  Patient follows for her CLL at [facility] in [place] with Dr. Sonny Lever.  She is on a research protocol.  Local oncologist is Dr. Evalene Jacobo.   HPI Discussed the use of AI scribe software for clinical note transcription with the patient, who gave verbal consent to proceed.  History of Present Illness   Monique [name] is a 53 year old female with chronic myeloid leukemia who presents for follow-up of a right pleural effusion.  Her shortness of breath has resolved, but she has experienced a dry cough though for the last few days, no sputum production no hemoptysis. No fever,chills, sweats or recurrence of shortness of  breath.  Her last pleural tap was successful and was performed on 10 October.  She has not had recurrent symptoms of shortness of breath since then.  No chest heaviness or pain.  She has had a total of 3 pleural drainage procedures.  At this point avoiding indwelling catheters due to her CLL therapy and potential for introduction of infection with indwelling catheter.  She is currently undergoing treatment for chronic lymphocytic leukemia, which includes a bone marrow procedure scheduled for the 25th as part of CAR T-cell therapy.  She is on pirtobrutinib which she will withhold on 23 November prior to her bone marrow procedure.  She was instructed to hold off on albuterol  due to subjective excessive urination.    ONCOLOGY HISTORY   CLL (chronic lymphocytic leukemia)  10/29/2016 Initial Diagnosis  CLL (chronic lymphocytic leukemia) (CMD) based upon peripheral blood flow cytometry performed at an outside facility.  01/21/2017 Imaging  CT of T/A/P showed diffuse lymphadenpathy, bulky in the abdomen.  01/21/2017 - 02/20/2017 Chemotherapy  Weekly rituximab  x 4, initated because of CT findings as well as night sweats and increasing white cell count.  01/08/2018 Pathology  New lymphadeonpathy developed and a right axillary lymph node biopsy was consistent with CLL.    01/21/2018 Imaging : CT of T/A/P was consistent with progression of disease (POD)  01/28/2018 through 03/25/2018 -3 cycles of Bendamustine  and rituximab  (BR) chemotherapy   04/2018 Imaging: CT scan reportedly showed stable disease  07/14/2018 Imaging: Repeat CT of T/A/P showed no increase in lymphadenopathy.  07/2018 - Chemotherapy: Ibrutinib  480 mg started for symptomatic axillary  lymphadenopathy and night sweats.  10/22/2018 Imaging: Repeat CT scan showed improved lymphadenopathy.  10/21/2019 - Oncology Treatment: Ferritin was less than 5 so she received IV ferraheme.  06/16/2020 Complications: Sometime between 06/16/2020 and 09/26/2020 it  appears that her ibrutinib  was held due to POD although in future notes it appears that she was still on it.    03/2021 - Oncology Treatment: Received IV iron  again  07/05/2021 - Chemotherapy: Patient possibly on rubraca (rucaparib) in addition to the ibrutinib  but this is not confirmed   01/22/2022 Imaging: CT showed POD  01/31/2022 - Chemotherapy: Cycle 1 of venetoclax  and rituximab ; however this appears to have been discontinued shortly after it was begun due to diarrhea and an increase in LFTs.  02/28/2022 - Chemotherapy: Lenalidomide  10 mg daily x 21 days given concomitantly with rituximab  every 4 weeks.  03/14/2022 - Chemotherapy: Ongoing chemotherapy as above  07/11/2022 - Chemotherapy: Cycle 8 of monthly rituximab  with lenalidomide  10 mg daily given for 21 days.  07/31/2022 Imaging: CT scan reportedly showed mild POD  07/31/2022 - Chemotherapy: Received cycle 9 of monthly rituximab  with lenalidomide  10 mg x 21 days.  09/12/2022 - Chemotherapy: Cycle 10 of monthly rituximab  with lenalidomide  10 mg x 21 days. Mild neuropathy was noted.  12/12/2022 - Chemotherapy: Cycle 13 of monthly rituximab  with lenalidomide  10 mg x 21 days.   03/21/2023 Imaging: CT scan showed left axillary lymph node increasing from 13 mm to 20 mm.  03/21/2023 - Chemotherapy: Despite those CT findings she went on to receive cycle 16 of monthly rituximab  with lenalidomide  10 mg x 21 days.   04/16/2023 Imaging: PET scan showed mild POD.  04/16/2023 - Chemotherapy: Received cycle 17 of monthly rituximab  with lenalidomide  10 mg x 21 days. This was noted to be her last dose of rituximab . She continued on the lenalidomide .  05/12/2023 - Radiation: She received 400 cGy in 2 fractions to the left axillary region.  08/06/2023 Imaging: Repeat PET scan was performed; marked interval enlargement of LEFT periaortic retroperitoneal nodal mass with significant increase in metabolic activity concerning for transformation chronic  lymphocytic leukemia.  Stable enlarged axillary and mediastinal lymph nodes with mild metabolic activity.  Interval increase in the pelvic adenopathy size and metabolic activity.  Mild increase in upper abdominal periaortic adenopathy.  09/15/2023 Pathology: CT guided biopsy of a left retroperitoneal mass showed CLL.  10/07/2023 - Radiation: She received 30 Gy over 15 fractions to the left retroperitoneal area.  11/17/2023 Complications: Developed thrombocytopenia, platelets dropped to 37K and the lenalidomide  was held.  12/31/2023 - Chemotherapy: Thrombocytopenia resolved, resumed lenalidomide .   02/06/2024 Complications: Noted to have mild POD but continued on the lenalidomide .  03/2024 Complications: Her thrombocytopenia returned and her lenalidomide  was held again.  04/26/2024 Pathology: IR guided bone marrow biopsy revealed a 90% cellular marrow with 95% CLL cells.  05/04/2024 Imaging: PET scan showed mixed response to therapy with increased pleural thickening and an increased SUV in the right hemithorax and enlarging right perirectal lymph nodes (SUV 2.9).  Large pleural effusion noted.  05/11/2024 Pathology: Thoracentesis revealed the pleural fluid to have CLL cells.  05/12/2024 - Oncology Treatment: She was subsequently referred to [facility] for potential CAR-T therapy but she was out of their network.  06/13/2024 - 06/17/2024 Hospital Admission-[facility]: She was admitted 9/28-10/2/25 to [facility] hospital. She presented to the ED for dyspnea, tachypnea, new O2 requirement. CT showed large R pleural effusion and diffuse lymphadenopathy. Chest tube was placed during admission and removed by discharge. Fluid  tested positive for CLL.    06/24/2024 - Chemotherapy: She established care with Dr. Alto and started on Pirtobrutinib and allopurinol  ~06/24/24.   Review of Systems A 10 point review of systems was performed and it is as noted above otherwise negative.   Patient Active Problem List   Diagnosis Date Noted    Pleural effusion, malignant (HCC) 07/14/2024   Immunocompromised state 07/14/2024   Stage 3a chronic kidney disease (HCC) 10/02/2023   Senile purpura 10/02/2023   Major depression in remission 10/02/2023   Pancytopenia (HCC) 10/02/2023   Post-menopause on HRT (hormone replacement therapy) 11/08/2022   At risk for sleep apnea 09/19/2021   Lumbar foraminal stenosis (L4-5) (Bilateral) (R>L) 06/05/2021   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 06/05/2021   Lumbosacral radiculopathy/radiculitis at L5 (Bilateral) 06/05/2021   PAD (peripheral artery disease) 02/14/2021   CIN II (cervical intraepithelial neoplasia II) 02/01/2021   S/P vaginal hysterectomy 01/22/2021   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/09/2020   Bilateral leg pain 11/03/2019   Iron  deficiency anemia 10/15/2019   Bilateral arm pain 08/05/2019   Bilateral hand numbness 08/05/2019   Weakness of both hands 08/05/2019   Chronic musculoskeletal pain 07/19/2019   Chronic neuropathic pain 07/19/2019   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   Special screening for malignant neoplasms, colon    Polyp of sigmoid colon    Hemorrhoids    Trigeminal neuralgia 10/05/2018   Lumbar radiculitis (Right) 09/24/2018   Hypocalcemia 06/03/2018   Vitamin D  insufficiency 06/03/2018   Abnormal MRI, lumbar spine (06/02/2020) 06/03/2018   Chronic hip pain (Right) 06/03/2018   Spondylosis without myelopathy or radiculopathy, lumbar region 06/03/2018   DDD (degenerative disc disease), lumbar 06/02/2018   Lumbar facet hypertrophy 06/02/2018   Lumbar facet arthropathy 06/02/2018   Lumbar facet syndrome (Bilateral) (R>L) 06/02/2018   Marijuana use 05/19/2018   Chronic lower extremity pain (1ry area of Pain) (Bilateral) (R>L) 05/13/2018   Chronic low back pain (2ry area of Pain) (Bilateral) (R>L) w/ sciatica (Bilateral) 05/13/2018   Chronic pain syndrome 05/13/2018   Disorder of skeletal system 05/13/2018    Pharmacologic therapy 05/13/2018   Problems influencing health status 05/13/2018   Mastalgia 05/05/2018   Breast mass, right 01/07/2018   Face pain 12/11/2017   Tingling 12/11/2017   Thoracic aortic atherosclerosis 08/20/2017   Emphysema of lung (HCC) 08/20/2017   Adductor tendinitis 04/23/2017   Trochanteric bursitis of right hip 04/23/2017   GERD without esophagitis 12/11/2016   CLL (chronic lymphocytic leukemia) (HCC) 11/24/2016   Stress incontinence 09/04/2016   B12 deficiency 07/10/2015   Insomnia 07/10/2015   Anorexia nervosa, restricting type (HCC) 07/10/2015   Anxiety, generalized 07/10/2015   H/O suicide attempt 07/10/2015   Lymphocytosis 07/10/2015   Dysplasia of cervix, low grade (CIN 1) 07/10/2015   Obsessive-compulsive disorder 07/10/2015   Tobacco use 07/10/2015   History of cervical dysplasia 07/18/2014    Social History   Tobacco Use   Smoking status: Every Day    Current packs/day: 1.00    Average packs/day: 1 pack/day for 37.9 years (37.9 ttl pk-yrs)    Types: Cigarettes    Start date: 1988   Smokeless tobacco: Never  Substance Use Topics   Alcohol use: Not Currently    Alcohol/week: 0.0 standard drinks of alcohol    Comment: rarely    Allergies  Allergen Reactions   Doxycycline  Rash    Rash on cheeks and sides of nose    Duloxetine  Rash    Current Meds  Medication Sig   albuterol  (VENTOLIN  HFA) 108 (90 Base) MCG/ACT inhaler Inhale 2 puffs into the lungs every 6 (six) hours as needed for wheezing or shortness of breath.   alendronate  (FOSAMAX ) 70 MG tablet Take 1 tablet (70 mg total) by mouth every 7 (seven) days. Take with a full glass of water on an empty stomach.   allopurinol  (ZYLOPRIM ) 300 MG tablet Take 300 mg by mouth daily.   ARIPiprazole  (ABILIFY ) 5 MG tablet Take 1 tablet (5 mg total) by mouth daily.   atorvastatin  (LIPITOR) 10 MG tablet Take 1 tablet (10 mg total) by mouth daily.   benzonatate  (TESSALON ) 100 MG capsule Take 1-2  capsules (100-200 mg total) by mouth 3 (three) times daily as needed for cough.   Ca Phosphate-Cholecalciferol (CALTRATE GUMMY BITES) 250-10 MG-MCG CHEW Chew 1 gummy by mouth 2 (two) times daily.   estradiol  (ESTRACE ) 0.5 MG tablet Take 1 tablet (0.5 mg total) by mouth daily.   famotidine  (PEPCID ) 10 MG tablet Take by mouth.   levocetirizine (XYZAL ) 5 MG tablet Take 1 tablet (5 mg total) by mouth every evening.   MAGNESIUM  PO Take by mouth.   nortriptyline  (PAMELOR ) 10 MG capsule Take 2 capsules (20 mg total) by mouth daily.   omeprazole  (PRILOSEC) 20 MG capsule Take 1 capsule (20 mg total) by mouth in the morning.   pirtobrutinib (JAYPIRCA) 100 MG tablet Take 200 mg by mouth daily.   pregabalin  (LYRICA ) 150 MG capsule Take 1 capsule (150 mg total) by mouth 3 (three) times daily.   tiZANidine  (ZANAFLEX ) 2 MG tablet Take 1 tablet (2 mg total) by mouth at bedtime.   umeclidinium-vilanterol (ANORO ELLIPTA ) 62.5-25 MCG/ACT AEPB Inhale 1 puff into the lungs daily.   vitamin B-12 (CYANOCOBALAMIN ) 500 MCG tablet Take 250 mcg by mouth every morning.   VITAMIN D  PO Take by mouth.    Immunization History  Administered Date(s) Administered   Influenza,inj,Quad PF,6+ Mos 07/07/2014, 07/09/2019, 10/26/2020, 06/13/2021, 10/15/2022   Influenza-Unspecified 07/07/2014   Pneumococcal Polysaccharide-23 07/07/2014   Tdap 05/04/2014, 05/19/2021   Zoster Recombinant(Shingrix ) 10/26/2020, 10/15/2022        Objective:     BP 90/64   Pulse 91   Temp 97.6 F (36.4 C) (Temporal)   Ht 5' 7 (1.702 m)   Wt 157 lb 12.8 oz (71.6 kg)   LMP 08/17/2016   SpO2 99%   BMI 24.71 kg/m   SpO2: 99 %  GENERAL: Overweight well-developed woman, presents in transport chair.  No conversational dyspnea. HEAD: Normocephalic, atraumatic.  EYES: Pupils equal, round, reactive to light.  No scleral icterus.  MOUTH: Poor dentition, oral mucosa moist.  No thrush. NECK: Supple. No thyromegaly. Trachea midline. No JVD.  No  adenopathy. PULMONARY: Good air entry bilaterally.  No diminished breath sounds on the right as noted previously.  No wheezes or rhonchi CARDIOVASCULAR: S1 and S2. Regular rate and rhythm.  No rubs, murmurs or gallops heard. ABDOMEN: Benign. MUSCULOSKELETAL: No joint deformity, no clubbing, no edema.  NEUROLOGIC: No overt focal deficit, no gait disturbance, speech is fluent. SKIN: Intact,warm,dry.  Multiple petechiae particularly on the chest and upper arms. PSYCH: Mood and behavior normal.   Point-of-care ultrasound performed: There is a simple right pleural effusion noted which is small, not enough to tap.    Lab Results  Component Value Date   NITRICOXIDE 6 07/27/2024  *This result suggests low (<25) Type II (T2) airway inflammation indicating a low likelihood of active T2-driven airway inflammation.  In a  patient with active T2 driven asthma management it suggests good control.    Assessment & Plan:     ICD-10-CM   1. Recurrent pleural effusion on right  J90     2. CLL (chronic lymphocytic leukemia) (HCC)  C91.10     3. Tobacco dependence due to cigarettes  F17.210     4. Other cough  R05.8 Nitric oxide      Orders Placed This Encounter  Procedures   Nitric oxide   Discussion:    Right pleural effusion secondary to chronic lymphocytic leukemia The right pleural effusion is improving with current therapy. Fluid is present but reduced compared to previous assessments. No indication for further thoracentesis at this time. The effusion is expected to resolve with ongoing treatment. - Continue current therapy for chronic lymphocytic leukemia. - Monitor for signs of infection or increased effusion, such as fever, shortness of breath, or discolored sputum.  Dry cough may be associated with lung re-expansion The dry cough is likely due to lung re-expansion as the pleural effusion decreases. Inflammation levels in the airway are down, supporting this assessment. The cough is  expected to improve as the lung fully re-expands.  Patient already noticing improvement. - Monitor cough for changes, such as production of discolored sputum, fever, or increased shortness of breath. - Advised to report any discolored sputum, fever, or increased shortness of breath for potential antibiotic treatment.     Advised if symptoms do not improve or worsen, to please contact office for sooner follow up or seek emergency care.    I spent 32 minutes of dedicated to the care of this patient on the date of this encounter to include pre-visit review of records, face-to-face time with the patient discussing conditions above, post visit ordering of testing, clinical documentation with the electronic health record, making appropriate referrals as documented, and communicating necessary findings to members of the patients care team.     C. Leita Sanders, MD Advanced Bronchoscopy PCCM [facility]    *This note was generated using voice recognition software/Dragon and/or AI transcription program.  Despite best efforts to proofread, errors can occur which can change the meaning. Any transcriptional errors that result from this process are unintentional and may not be fully corrected at the time of dictation.

## 2024-07-28 ENCOUNTER — Encounter: Admitting: Physical Therapy

## 2024-07-28 ENCOUNTER — Encounter: Payer: Self-pay | Admitting: Pulmonary Disease

## 2024-07-28 ENCOUNTER — Other Ambulatory Visit: Payer: Self-pay

## 2024-07-29 ENCOUNTER — Telehealth: Payer: Self-pay

## 2024-07-29 NOTE — Telephone Encounter (Signed)
 Copied from CRM #[id number]. Topic: Clinical - Home Health Verbal Orders >> Jul 29, 2024  2:07 PM Nathanel BROCKS wrote: Caller/Agency: Tiffany with [facility] Callback Number: [phone] 2 Service Requested: Physical Therapy Nursing care and PT  Frequency: Nursing is 1 week 4   PT 1 week 1, 2 week 2, I week 6  Any new concerns about the patient? No

## 2024-07-30 NOTE — Telephone Encounter (Signed)
 VO given.

## 2024-07-31 ENCOUNTER — Other Ambulatory Visit: Payer: Self-pay

## 2024-08-03 ENCOUNTER — Encounter: Admitting: Physical Therapy

## 2024-08-04 ENCOUNTER — Ambulatory Visit: Admitting: Family Medicine

## 2024-08-06 ENCOUNTER — Encounter: Admitting: Physical Therapy

## 2024-08-06 NOTE — Progress Notes (Signed)
 Pharmacy Quality Measure Review  This patient is appearing on a report for being at risk of failing the adherence measure for cholesterol (statin) medications this calendar year.   Medication: atorvastatin  Last fill date: 07/27/24 for 30 day supply  Insurance report was not up to date. No action needed at this time.   Rachele Lamaster E. Marsh, PharmD, CPP Clinical Pharmacist [facility] [phone]

## 2024-08-09 ENCOUNTER — Ambulatory Visit: Admitting: Physical Therapy

## 2024-08-10 ENCOUNTER — Other Ambulatory Visit: Payer: Self-pay | Admitting: Family Medicine

## 2024-08-10 ENCOUNTER — Other Ambulatory Visit: Payer: Self-pay

## 2024-08-10 DIAGNOSIS — G8929 Other chronic pain: Secondary | ICD-10-CM

## 2024-08-10 DIAGNOSIS — F325 Major depressive disorder, single episode, in full remission: Secondary | ICD-10-CM

## 2024-08-11 ENCOUNTER — Other Ambulatory Visit: Payer: Self-pay

## 2024-08-11 ENCOUNTER — Ambulatory Visit

## 2024-08-13 ENCOUNTER — Other Ambulatory Visit: Payer: Self-pay

## 2024-08-13 ENCOUNTER — Other Ambulatory Visit (HOSPITAL_COMMUNITY): Payer: Self-pay

## 2024-08-13 NOTE — Telephone Encounter (Signed)
 Requested medication (s) are due for refill today: yes  Requested medication (s) are on the active medication list: yes  Last refill:  05/03/24  Future visit scheduled: yes  Notes to clinic:  Unable to refill per protocol, cannot delegate.      Requested Prescriptions  Pending Prescriptions Disp Refills   tiZANidine  (ZANAFLEX ) 2 MG tablet 90 tablet 0    Sig: Take 1 tablet (2 mg total) by mouth at bedtime.     Not Delegated - Cardiovascular:  Alpha-2 Agonists - tizanidine  Failed - 08/13/2024 10:16 AM      Failed - This refill cannot be delegated      Passed - Valid encounter within last 6 months    Recent Outpatient Visits           1 month ago CLL (chronic lymphocytic leukemia) [facility])   [facility] [place] Mire, MD   2 months ago Pain of thigh muscle   [facility] Glenard Mire, MD   3 months ago Osteopenia determined by x-ray   [facility] Glenard Mire, MD   5 months ago Rib pain   [facility]   6 months ago Right-sided chest wall pain   [place] [facility] [place], Krichna, MD               ARIPiprazole  (ABILIFY ) 5 MG tablet 90 tablet 0    Sig: Take 1 tablet (5 mg total) by mouth daily.     Not Delegated - Psychiatry:  Antipsychotics - Second Generation (Atypical) - aripiprazole  Failed - 08/13/2024 10:16 AM      Failed - This refill cannot be delegated      Failed - TSH in normal range and within 360 days    TSH  Date Value Ref Range Status  09/12/2016 1.97 mIU/L Final    Comment:      Reference Range   > or = 20 Years  0.40-4.50   Pregnancy Range First trimester  0.26-2.66 Second trimester 0.55-2.73 Third trimester  0.43-2.91            Failed - Lipid Panel in normal range within the last 12 months    Cholesterol  Date Value Ref Range Status  10/02/2023 115 <200 mg/dL Final   LDL Cholesterol  (Calc)  Date Value Ref Range Status  10/02/2023 54 mg/dL (calc) Final    Comment:    Reference range: <100 . Desirable range <100 mg/dL for primary prevention;   <70 mg/dL for patients with CHD or diabetic patients  with > or = 2 CHD risk factors. SABRA LDL-C is now calculated using the Martin-Hopkins  calculation, which is a validated novel method providing  better accuracy than the Friedewald equation in the  estimation of LDL-C.  Gladis APPLETHWAITE et al. SANDREA. 7986;689(80): 2061-2068  (http://education.[facility].com/faq/FAQ164)    HDL  Date Value Ref Range Status  10/02/2023 45 (L) > OR = 50 mg/dL Final   Triglycerides  Date Value Ref Range Status  10/02/2023 77 <150 mg/dL Final         Passed - Completed PHQ-2 or PHQ-9 in the last 360 days      Passed - Last BP in normal range    BP Readings from Last 1 Encounters:  07/27/24 90/64         Passed - Last Heart Rate in normal range    Pulse Readings from Last 1 Encounters:  07/27/24 91         Passed - Valid encounter within last 6 months    Recent Outpatient Visits           1 month ago CLL (chronic lymphocytic leukemia) (HCC)   [facility] Glenard Mire, MD   2 months ago Pain of thigh muscle   [facility] Glenard Mire, MD   3 months ago Osteopenia determined by x-ray   [facility] Glenard Mire, MD   5 months ago Rib pain   [facility] Glenard Mire, MD   6 months ago Right-sided chest wall pain   [facility]ard Mire, MD              Passed - CBC within normal limits and completed in the last 12 months    WBC  Date Value Ref Range Status  05/10/2024 5.2 4.0 - 10.5 K/uL Final   RBC  Date Value Ref Range Status  05/10/2024 2.59 (L) 3.87 - 5.11 MIL/uL Final   Hemoglobin  Date Value Ref Range Status  05/10/2024 9.2 (L) 12.0 - 15.0 g/dL Final   91/92/7974 7.9 (L) 12.0 - 15.0 g/dL Final   HCT  Date Value Ref Range Status  05/10/2024 26.7 (L) 36.0 - 46.0 % Final   MCHC  Date Value Ref Range Status  05/10/2024 34.5 30.0 - 36.0 g/dL Final   [facility]  Date Value Ref Range Status  05/10/2024 35.5 (H) 26.0 - 34.0 pg Final   MCV  Date Value Ref Range Status  05/10/2024 103.1 (H) 80.0 - 100.0 fL Final   No results found for: PLTCOUNTKUC, LABPLAT, POCPLA RDW  Date Value Ref Range Status  05/10/2024 20.1 (H) 11.5 - 15.5 % Final         Passed - CMP within normal limits and completed in the last 12 months    Albumin  Date Value Ref Range Status  05/10/2024 3.6 3.5 - 5.0 g/dL Final  91/71/7980 4.1 3.5 - 5.5 g/dL Final   Alkaline Phosphatase  Date Value Ref Range Status  05/10/2024 117 38 - 126 U/L Final   Alkaline phosphatase (APISO)  Date Value Ref Range Status  12/06/2020 64 37 - 153 U/L Final   ALT  Date Value Ref Range Status  05/10/2024 15 0 - 44 U/L Final   AST  Date Value Ref Range Status  05/10/2024 27 15 - 41 U/L Final   BUN  Date Value Ref Range Status  05/10/2024 22 (H) 6 - 20 mg/dL Final  91/71/7980 15 6 - 24 mg/dL Final   Calcium   Date Value Ref Range Status  05/10/2024 9.4 8.9 - 10.3 mg/dL Final   CO2  Date Value Ref Range Status  05/10/2024 24 22 - 32 mmol/L Final   Creatinine  Date Value Ref Range Status  05/10/2024 1.17 (H) 0.44 - 1.00 mg/dL Final   Creat  Date Value Ref Range Status  12/06/2020 0.96 0.50 - 1.05 mg/dL Final    Comment:    For patients >27 years of age, the reference limit for Creatinine is approximately 13% higher for people identified as African-American. .    Glucose, Bld  Date Value Ref Range Status  05/10/2024 107 (H) 70 - 99 mg/dL Final    Comment:    Glucose reference range applies only to samples taken after fasting for at least 8 hours.   Glucose-Capillary  Date  Value Ref Range Status  05/04/2024 97 70 - 99 mg/dL Final    Comment:    Glucose  reference range applies only to samples taken after fasting for at least 8 hours.   Potassium  Date Value Ref Range Status  05/10/2024 3.8 3.5 - 5.1 mmol/L Final   Sodium  Date Value Ref Range Status  05/10/2024 135 135 - 145 mmol/L Final  05/13/2018 138 134 - 144 mmol/L Final   Total Bilirubin  Date Value Ref Range Status  05/10/2024 1.2 0.0 - 1.2 mg/dL Final   Protein, ur  Date Value Ref Range Status  10/10/2022 NEGATIVE NEGATIVE mg/dL Final   Protein, UA  Date Value Ref Range Status  07/14/2024 Negative Negative Final   Total Protein  Date Value Ref Range Status  05/10/2024 6.0 (L) 6.5 - 8.1 g/dL Final  91/71/7980 6.3 6.0 - 8.5 g/dL Final   GFR, Est African American  Date Value Ref Range Status  12/06/2020 79 > OR = 60 mL/min/1.13m2 Final   GFR, Est Non African American  Date Value Ref Range Status  12/06/2020 68 > OR = 60 mL/min/1.40m2 Final   GFR, Estimated  Date Value Ref Range Status  05/10/2024 55 (L) >60 mL/min Final    Comment:    (NOTE) Calculated using the CKD-EPI Creatinine Equation (2021)

## 2024-08-16 ENCOUNTER — Other Ambulatory Visit: Payer: Self-pay

## 2024-08-16 ENCOUNTER — Ambulatory Visit: Admitting: Physical Therapy

## 2024-08-16 ENCOUNTER — Other Ambulatory Visit (HOSPITAL_COMMUNITY): Payer: Self-pay

## 2024-08-16 MED ORDER — ARIPIPRAZOLE 5 MG PO TABS
5.0000 mg | ORAL_TABLET | Freq: Every day | ORAL | 0 refills | Status: AC
  Filled 2024-08-16: qty 90, 90d supply, fill #0
  Filled 2024-08-19: qty 30, 30d supply, fill #0
  Filled 2024-09-21: qty 30, 30d supply, fill #1
  Filled 2024-10-21: qty 30, 30d supply, fill #2

## 2024-08-16 MED ORDER — TIZANIDINE HCL 2 MG PO TABS
2.0000 mg | ORAL_TABLET | Freq: Every day | ORAL | 0 refills | Status: AC
  Filled 2024-08-16: qty 90, 90d supply, fill #0
  Filled 2024-08-19: qty 30, 30d supply, fill #0
  Filled 2024-09-21: qty 30, 30d supply, fill #1
  Filled 2024-10-21: qty 30, 30d supply, fill #2

## 2024-08-16 NOTE — Telephone Encounter (Signed)
 Lvm asking pt to return call to schedule follow up in Feb and that two prescriptions have been sent to pharmacy

## 2024-08-18 ENCOUNTER — Ambulatory Visit: Admitting: Physical Therapy

## 2024-08-19 ENCOUNTER — Other Ambulatory Visit (HOSPITAL_COMMUNITY): Payer: Self-pay

## 2024-08-19 ENCOUNTER — Other Ambulatory Visit: Payer: Self-pay

## 2024-08-23 ENCOUNTER — Ambulatory Visit

## 2024-08-25 ENCOUNTER — Ambulatory Visit: Admitting: Physical Therapy

## 2024-08-30 ENCOUNTER — Ambulatory Visit: Admitting: Physical Therapy

## 2024-09-01 ENCOUNTER — Ambulatory Visit: Admitting: Physical Therapy

## 2024-09-06 ENCOUNTER — Ambulatory Visit: Admitting: Physical Therapy

## 2024-09-10 ENCOUNTER — Other Ambulatory Visit (HOSPITAL_COMMUNITY): Payer: Self-pay

## 2024-09-13 ENCOUNTER — Ambulatory Visit: Admitting: Physical Therapy

## 2024-09-15 ENCOUNTER — Other Ambulatory Visit: Payer: Self-pay

## 2024-09-15 ENCOUNTER — Ambulatory Visit: Admitting: Physical Therapy

## 2024-09-21 ENCOUNTER — Other Ambulatory Visit: Payer: Self-pay | Admitting: Family Medicine

## 2024-09-21 ENCOUNTER — Other Ambulatory Visit: Payer: Self-pay

## 2024-09-22 ENCOUNTER — Other Ambulatory Visit: Payer: Self-pay | Admitting: Family Medicine

## 2024-09-22 ENCOUNTER — Other Ambulatory Visit: Payer: Self-pay

## 2024-09-22 MED ORDER — OMEPRAZOLE 20 MG PO CPDR
20.0000 mg | DELAYED_RELEASE_CAPSULE | Freq: Every morning | ORAL | 5 refills | Status: AC
  Filled 2024-09-22: qty 30, 30d supply, fill #0
  Filled 2024-10-21: qty 30, 30d supply, fill #1

## 2024-09-30 ENCOUNTER — Ambulatory Visit: Admitting: Pulmonary Disease

## 2024-10-11 ENCOUNTER — Other Ambulatory Visit: Payer: Self-pay

## 2024-10-13 ENCOUNTER — Other Ambulatory Visit: Payer: Self-pay

## 2024-10-19 ENCOUNTER — Other Ambulatory Visit (HOSPITAL_COMMUNITY): Payer: Self-pay

## 2024-10-21 ENCOUNTER — Other Ambulatory Visit: Payer: Self-pay | Admitting: Family Medicine

## 2024-10-21 ENCOUNTER — Encounter: Payer: Self-pay | Admitting: Oncology

## 2024-10-21 ENCOUNTER — Other Ambulatory Visit: Payer: Self-pay

## 2024-10-21 DIAGNOSIS — J309 Allergic rhinitis, unspecified: Secondary | ICD-10-CM

## 2024-10-22 ENCOUNTER — Other Ambulatory Visit: Payer: Self-pay | Admitting: Family Medicine

## 2024-10-22 ENCOUNTER — Other Ambulatory Visit: Payer: Self-pay

## 2024-10-22 DIAGNOSIS — J309 Allergic rhinitis, unspecified: Secondary | ICD-10-CM

## 2024-10-22 MED ORDER — LEVOCETIRIZINE DIHYDROCHLORIDE 5 MG PO TABS
5.0000 mg | ORAL_TABLET | Freq: Every evening | ORAL | 1 refills | Status: AC
  Filled 2024-10-22: qty 30, 30d supply, fill #0

## 2024-12-23 ENCOUNTER — Ambulatory Visit
